# Patient Record
Sex: Female | Born: 1945
Health system: Southern US, Community
[De-identification: ages and names within clinical notes are randomized; demographics above are authoritative.]

## PROBLEM LIST (undated history)

## (undated) DIAGNOSIS — F329 Major depressive disorder, single episode, unspecified: Secondary | ICD-10-CM

## (undated) DIAGNOSIS — I701 Atherosclerosis of renal artery: Secondary | ICD-10-CM

## (undated) DIAGNOSIS — Z9289 Personal history of other medical treatment: Secondary | ICD-10-CM

## (undated) DIAGNOSIS — I471 Supraventricular tachycardia: Secondary | ICD-10-CM

## (undated) DIAGNOSIS — R011 Cardiac murmur, unspecified: Secondary | ICD-10-CM

## (undated) DIAGNOSIS — K2980 Duodenitis without bleeding: Secondary | ICD-10-CM

## (undated) DIAGNOSIS — D649 Anemia, unspecified: Secondary | ICD-10-CM

## (undated) DIAGNOSIS — K222 Esophageal obstruction: Secondary | ICD-10-CM

## (undated) DIAGNOSIS — I10 Essential (primary) hypertension: Secondary | ICD-10-CM

## (undated) DIAGNOSIS — R0600 Dyspnea, unspecified: Secondary | ICD-10-CM

## (undated) DIAGNOSIS — F419 Anxiety disorder, unspecified: Secondary | ICD-10-CM

## (undated) DIAGNOSIS — E785 Hyperlipidemia, unspecified: Secondary | ICD-10-CM

## (undated) DIAGNOSIS — C9000 Multiple myeloma not having achieved remission: Secondary | ICD-10-CM

## (undated) DIAGNOSIS — K219 Gastro-esophageal reflux disease without esophagitis: Secondary | ICD-10-CM

## (undated) DIAGNOSIS — K259 Gastric ulcer, unspecified as acute or chronic, without hemorrhage or perforation: Secondary | ICD-10-CM

## (undated) DIAGNOSIS — I495 Sick sinus syndrome: Secondary | ICD-10-CM

## (undated) DIAGNOSIS — I219 Acute myocardial infarction, unspecified: Secondary | ICD-10-CM

## (undated) DIAGNOSIS — N183 Chronic kidney disease, stage 3 unspecified: Secondary | ICD-10-CM

## (undated) DIAGNOSIS — H3561 Retinal hemorrhage, right eye: Secondary | ICD-10-CM

## (undated) DIAGNOSIS — I2510 Atherosclerotic heart disease of native coronary artery without angina pectoris: Secondary | ICD-10-CM

## (undated) DIAGNOSIS — I82409 Acute embolism and thrombosis of unspecified deep veins of unspecified lower extremity: Secondary | ICD-10-CM

## (undated) DIAGNOSIS — I249 Acute ischemic heart disease, unspecified: Secondary | ICD-10-CM

## (undated) DIAGNOSIS — F32A Depression, unspecified: Secondary | ICD-10-CM

## (undated) DIAGNOSIS — K5790 Diverticulosis of intestine, part unspecified, without perforation or abscess without bleeding: Secondary | ICD-10-CM

## (undated) DIAGNOSIS — D126 Benign neoplasm of colon, unspecified: Secondary | ICD-10-CM

## (undated) HISTORY — PX: TOTAL ABDOMINAL HYSTERECTOMY: SHX209

## (undated) HISTORY — DX: Major depressive disorder, single episode, unspecified: F32.9

## (undated) HISTORY — DX: Benign neoplasm of colon, unspecified: D12.6

## (undated) HISTORY — DX: Depression, unspecified: F32.A

## (undated) HISTORY — PX: TONSILLECTOMY: SUR1361

## (undated) HISTORY — PX: APPENDECTOMY: SHX54

## (undated) HISTORY — DX: Gastro-esophageal reflux disease without esophagitis: K21.9

## (undated) HISTORY — DX: Gastric ulcer, unspecified as acute or chronic, without hemorrhage or perforation: K25.9

## (undated) HISTORY — DX: Hyperlipidemia, unspecified: E78.5

## (undated) HISTORY — DX: Anemia, unspecified: D64.9

## (undated) HISTORY — PX: LUMBAR DISC SURGERY: SHX700

## (undated) HISTORY — DX: Esophageal obstruction: K22.2

## (undated) HISTORY — DX: Duodenitis without bleeding: K29.80

## (undated) HISTORY — DX: Cardiac murmur, unspecified: R01.1

## (undated) HISTORY — DX: Diverticulosis of intestine, part unspecified, without perforation or abscess without bleeding: K57.90

## (undated) HISTORY — PX: BACK SURGERY: SHX140

---

## 1997-10-20 DIAGNOSIS — I2510 Atherosclerotic heart disease of native coronary artery without angina pectoris: Secondary | ICD-10-CM

## 1997-10-20 DIAGNOSIS — I701 Atherosclerosis of renal artery: Secondary | ICD-10-CM

## 1997-10-20 DIAGNOSIS — I219 Acute myocardial infarction, unspecified: Secondary | ICD-10-CM

## 1997-10-20 HISTORY — DX: Acute myocardial infarction, unspecified: I21.9

## 1997-10-20 HISTORY — DX: Atherosclerosis of renal artery: I70.1

## 1997-10-20 HISTORY — PX: CORONARY ANGIOPLASTY WITH STENT PLACEMENT: SHX49

## 1997-10-20 HISTORY — DX: Atherosclerotic heart disease of native coronary artery without angina pectoris: I25.10

## 1997-10-20 HISTORY — PX: RENAL ARTERY STENT: SHX2321

## 1998-09-07 ENCOUNTER — Encounter: Payer: Self-pay | Admitting: Emergency Medicine

## 1998-09-07 ENCOUNTER — Inpatient Hospital Stay (HOSPITAL_COMMUNITY): Admission: AD | Admit: 1998-09-07 | Discharge: 1998-09-11 | Payer: Self-pay | Admitting: Cardiovascular Disease

## 1999-04-05 ENCOUNTER — Encounter: Payer: Self-pay | Admitting: Cardiovascular Disease

## 1999-04-05 ENCOUNTER — Inpatient Hospital Stay (HOSPITAL_COMMUNITY): Admission: AD | Admit: 1999-04-05 | Discharge: 1999-04-07 | Payer: Self-pay | Admitting: Cardiovascular Disease

## 2003-11-09 ENCOUNTER — Emergency Department (HOSPITAL_COMMUNITY): Admission: AD | Admit: 2003-11-09 | Discharge: 2003-11-09 | Payer: Self-pay | Admitting: Emergency Medicine

## 2004-01-15 ENCOUNTER — Emergency Department (HOSPITAL_COMMUNITY): Admission: EM | Admit: 2004-01-15 | Discharge: 2004-01-16 | Payer: Self-pay | Admitting: Emergency Medicine

## 2004-01-28 ENCOUNTER — Inpatient Hospital Stay (HOSPITAL_COMMUNITY): Admission: AD | Admit: 2004-01-28 | Discharge: 2004-02-02 | Payer: Self-pay | Admitting: Emergency Medicine

## 2005-05-09 ENCOUNTER — Emergency Department (HOSPITAL_COMMUNITY): Admission: EM | Admit: 2005-05-09 | Discharge: 2005-05-09 | Payer: Self-pay | Admitting: Emergency Medicine

## 2006-05-19 ENCOUNTER — Emergency Department (HOSPITAL_COMMUNITY): Admission: EM | Admit: 2006-05-19 | Discharge: 2006-05-19 | Payer: Self-pay | Admitting: Emergency Medicine

## 2006-11-16 ENCOUNTER — Encounter: Admission: RE | Admit: 2006-11-16 | Discharge: 2006-11-16 | Payer: Self-pay | Admitting: Cardiovascular Disease

## 2007-08-22 ENCOUNTER — Emergency Department (HOSPITAL_COMMUNITY): Admission: EM | Admit: 2007-08-22 | Discharge: 2007-08-22 | Payer: Self-pay | Admitting: Emergency Medicine

## 2008-06-09 ENCOUNTER — Emergency Department (HOSPITAL_COMMUNITY): Admission: EM | Admit: 2008-06-09 | Discharge: 2008-06-09 | Payer: Self-pay | Admitting: Emergency Medicine

## 2010-10-04 ENCOUNTER — Emergency Department (HOSPITAL_COMMUNITY)
Admission: EM | Admit: 2010-10-04 | Discharge: 2010-10-04 | Payer: Self-pay | Source: Home / Self Care | Attending: Cardiovascular Disease | Admitting: Cardiovascular Disease

## 2010-10-20 HISTORY — PX: RETINAL LASER PROCEDURE: SHX2339

## 2010-12-30 LAB — BRAIN NATRIURETIC PEPTIDE: Pro B Natriuretic peptide (BNP): 177 pg/mL — ABNORMAL HIGH (ref 0.0–100.0)

## 2010-12-30 LAB — BASIC METABOLIC PANEL
CO2: 24 mEq/L (ref 19–32)
Chloride: 110 mEq/L (ref 96–112)
GFR calc non Af Amer: >60 mL/min (ref >60)
Glucose, Bld: 118 mg/dL — ABNORMAL HIGH (ref 70–99)
Potassium: 3.4 mEq/L — ABNORMAL LOW (ref 3.5–5.1)

## 2010-12-30 LAB — CK TOTAL AND CKMB (NOT AT ARMC): Relative Index: 1.5 (ref 0.0–2.5)

## 2010-12-30 LAB — CBC
HCT: 37.6 % (ref 36.0–46.0)
MCV: 89.3 fL (ref 78.0–100.0)
Platelets: 192 10*3/uL (ref 150–400)
RBC: 4.21 MIL/uL (ref 3.87–5.11)
RDW: 15.1 % (ref 11.5–15.5)
WBC: 4.6 10*3/uL (ref 4.0–10.5)

## 2010-12-30 LAB — DIFFERENTIAL
Eosinophils Absolute: 0.2 10*3/uL (ref 0.0–0.7)
Eosinophils Relative: 3 % (ref 0–5)
Lymphs Abs: 1.4 10*3/uL (ref 0.7–4.0)

## 2010-12-30 LAB — TSH: TSH: 0.953 u[IU]/mL (ref 0.350–4.500)

## 2010-12-30 LAB — URINE MICROSCOPIC-ADD ON

## 2010-12-30 LAB — URINALYSIS, ROUTINE W REFLEX MICROSCOPIC
Bilirubin Urine: NEGATIVE
Protein, ur: 30 mg/dL — AB

## 2011-03-07 NOTE — Cardiovascular Report (Signed)
[name], [name]                              ACCOUNT NO.:  [id number]   MEDICAL RECORD NO.:  [id number]                   PATIENT TYPE:  INP   LOCATION:  2027                                 FACILITY:  [facility]   PHYSICIAN:  Ricki Rodriguez, M.D.               DATE OF BIRTH:  1946-07-27   DATE OF PROCEDURE:  01/30/2004  DATE OF DISCHARGE:  02/02/2004                              CARDIAC CATHETERIZATION   PROCEDURES PERFORMED:  1. Left heart catheterization.  2. Selective coronary angiography.  3. Left ventricular systolic function study.  4. Descending aortography.  5. Bilateral renal angiography.   CARDIOLOGIST:  Ricki Rodriguez, M.D.   INDICATIONS:  This 64 year old black female had chest pain and hypertension  with history of angioplasty of the left circumflex coronary artery in the  past along with a history of smoking, abnormal cardiogram, old myocardial  infarction, and family history of coronary artery disease.   ACCESS:  Approach; right femoral artery using 6 French sheath and catheters.   COMPLICATIONS:  None.   HEMODYNAMIC DATA:  Left ventricular pressure was 191/7 and aortic pressure  was 192/109.  Right renal artery pressure was 175/101 showing a 24 mm  gradient and the left renal artery showed a pressure of 159/95 with a 31 mm  gradient.   VENTRICULOGRAPHIC DATA:  Left Ventriculogram:  The left ventriculogram  showed hyperdynamic wall motion with an ejection fraction of 70-75%.   Left Main Coronary Artery:  The left main coronary artery showed mild distal  disease of 10%, otherwise unremarkable.   Left Anterior Descending Coronary Artery:  The left anterior descending  coronary artery showed proximal 20-30% stenosis of posterior diagonal-1  origin and a 40-50% eccentric lesion with a little slow distal filling of  TIMI Grade II to III.  The diagonal-1 had a proximal-to-midvessel long 50%  diseasing prior to dividing into the superoinferior branch.  Another  smaller  branch of the diagonal-1 vessel with a region in the middle of the lesion;  has 80-90% ostial lesion, but less than 1 mm vessel size.   Left Circumflex Coronary Artery:  The left circumflex coronary artery showed  mild proximal disease of 10-20%, then 30% eccentric lesions and in-stent 10-  20% stenosis, post stent 20-30% stenosis.  The obtuse marginal branch-1 had  ostial 30-40% lesion, but was a 1.5 mm vessel.  The obtuse marginal branch-2  and 3 were normal vessels.   Right Coronary Artery:  The right coronary artery showed diffuse __________  appearance of the entire vessel with 30-40% proximal  concentric lesions;  midvessel 50% and distal 20-30% lesions.  The posterior descending coronary  artery had mild diffuse disease of 10-20% in severity and the posterolateral  branch was unremarkable.   AORTOGRAPHIC DATA:  Ascending aortography showed bilateral renal artery  disease.  The left renal artery had 70% eccentric proximal lesion and the  right renal artery also had 70% eccentric proximal lesion.   IMPRESSION:  1. Mild-to-moderate multivessel native vessel coronary artery disease.  2. Patent stent site in the left circumflex coronary artery.  3. Severe bilateral renal artery disease.  4. Hypertensive heart disease.   RECOMMENDATIONS:  1. This patient will be treated medically for coronary artery disease.  2. The patient will be scheduled for PTCA with stent placement in the     bilateral renal arteries by Dr. Charolette Child.  3. We will also change her Altace to clonidine, Norvasc and a diuretic.                                               Ricki Rodriguez, M.D.    ASK/MEDQ  D:  02/28/2004  T:  03/01/2004  Job:  [id number]

## 2011-03-07 NOTE — Discharge Summary (Signed)
NAMEQUINNLYN, HEARNS                              ACCOUNT NO.:  [id number]   MEDICAL RECORD NO.:  [id number]                   PATIENT TYPE:  INP   LOCATION:  2027                                 FACILITY:  [facility]   PHYSICIAN:  Ricki Rodriguez, M.D.               DATE OF BIRTH:  12/14/1945   DATE OF ADMISSION:  01/27/2004  DATE OF DISCHARGE:  02/02/2004                                 DISCHARGE SUMMARY   REFERRING PHYSICIAN:  Eric L. August Saucer, M.D.   DISCHARGE DIAGNOSES:  1. Bilateral renal artery stenoses.  2. Hypertension.  3. Sinus bradycardia, paroxysmal and iatrogenic.  4. Beta-blocker and clonidine adverse effects.  5. Bilateral renal artery stenting.  6. Multivessel and 3-vessel coronary artery disease.   DISCHARGE MEDICATIONS:  1. Aspirin 325 mg 1 p.o. daily.  2. Plavix 75 mg 1 p.o. daily.  3. Altace 10 mg 1 p.o. daily.  4. Norvasc 10 mg 1 p.o. daily.  5. Hydrochlorothiazide 12.5 mg 1 daily.  6. Lipitor 20 mg 1 daily.  7. Nitroglycerin 0.4 mg sublingual q.5 minutes x3 p.r.n. chest pain.  8. Imdur 30 mg 1/2 tablet daily.  9. For pain management, use Tylenol as needed.   FOLLOW UP:  By Dr. Ricki Rodriguez in 2 weeks and Dr. Willey Blade in 1 month.   CONDITION ON DISCHARGE:  Improved.   ACTIVITY:  As tolerated.   DIET:  Low salt, low fat diet.   WOUND CARE:  Patient to notify for right groin pain swelling or discharge.   HISTORY:  This 65 year old black female presented with weakness, dizziness  and uncontrolled hypertension, with frequent hospitalizations. Had  hypertension for 9 years, smokes 1/2 pack of cigarettes for 25 years.  Myocardial infarction in November '99, obesity, family history of  significant coronary artery disease.   PHYSICAL EXAMINATION:  VITAL SIGNS:  Temperature 98, pulse 50, respirations  16, blood pressure 160/82, height 5 feet, 5 inches, weight 195 pounds.  GENERAL:  Patient was alert and oriented x3.  HEENT:  Head normocephalic, atraumatic.   Pupils are equal, round and  reactive to light, extraocular movements intact.  Conjunctivae pink, sclerae  nonicteric.  Eye color brown.  ENT revealed patient to be edentulous.  NECK:  No JVD, no carotid bruits.  LUNGS:  Clear bilaterally.  CARDIOVASCULAR:  Normal S1 and S2, with grade 2/6 systolic murmur.  ABDOMEN:  Soft, nontender.  EXTREMITIES:  No clubbing, cyanosis or edema.  NEUROLOGIC:  Grossly intact. Patient moves all 4 extremities.   LABORATORY DATA:  Normal hemoglobin, hematocrit, WBC, platelet count, normal  CK-MB, troponin I negative, albumin level normal, electrolytes, BUN,  creatinine normal. Cholesterol level 229, LDL cholesterol elevated at 150,  HDL cholesterol normal at 51.  Thyroid stimulating hormone unremarkable.  Cardiac catheterization showed hypertensive heart with mild, multivessel  native coronary artery disease, patent stent site in left circumflex  coronary artery, but severe bilateral renal artery stenoses.   HOSPITAL COURSE:  The patient was admitted to telemetry unit, myocardial  infarction was ruled out. She underwent a cardiac catheterization because of  her multiples cardiac risk factors as well as known history of coronary  artery disease.  This revealed mild to moderate multivessel coronary artery  disease, however, she had severe bilateral renal artery stenoses. This was  treated with Genesis 6 x 15 mm stent in left renal artery and Genesis 5 x 12  mm in right renal artery, 8-12 atmospheres for 30-15 seconds respectively.  The patient tolerated the procedure well and had good angiographic results.   Post-procedure condition remained stable. Hence, she was discharged to home  on February 02, 2004 in satisfactory condition, with follow up with myself in 2  weeks.  Heart catheterization was done by myself, Dr. Algie Coffer and renogram  and stent placement was done by Dr. Charolette Child.                                                Ricki Rodriguez,  M.D.    ASK/MEDQ  D:  02/02/2004  T:  02/03/2004  Job:  [id number]

## 2011-03-07 NOTE — Cardiovascular Report (Signed)
[name], [name]                              ACCOUNT NO.:  [id number]   MEDICAL RECORD NO.:  [id number]                   PATIENT TYPE:  INP   LOCATION:  2027                                 FACILITY:  [facility]   PHYSICIAN:  Aram Candela. [name], M.D.              DATE OF BIRTH:  11-20-1945   DATE OF PROCEDURE:  02/01/2004  DATE OF DISCHARGE:  02/02/2004                              CARDIAC CATHETERIZATION   PROCEDURES:  1. Bilateral renal artery angiography.  2. Bilateral renal artery angioplasty with stent placement.  3. Right femoral artery AngioSeal.   CARDIOLOGIST:  Aram Candela. Aleen Campi, M.D.   REFERRING PHYSICIAN:  Ricki Rodriguez, M.D.   INDICATIONS:  This 65 year old female with severe difficulty controlling  hypertension underwent a cardiac catheterization earlier this week and was  found to have mild to moderate coronary lesions but because of her severe  hypertension she also had an abdominal aortogram which noted severe  bilateral renal artery stenosis.  She was then scheduled for a study and  intervention today.   DESCRIPTION OF PROCEDURE:  After signing an informed consent, the patient  was premedicated with 5 mg of Valium by mouth and brought to the peripheral  vascular laboratory on the sixth floor of [facility].  Her right  groin was prepped and draped in the sterile fashion and anesthetized locally  with 1% lidocaine.  A 6 French introducer sheath was inserted percutaneously  into the right femoral artery.  A 6 French short right guide catheter was  inserted through the right femoral sheath and advanced to the mid abdominal  aorta.  Injections were made into both renal arteries.  After sizing the  vessels we selected a stabilizer wire which was inserted through the guide  catheter and into the left renal artery.  We then selected a Genesis 6 x 15  stent deployment system was advanced over the guidewire and positioned  within the ostial lesion in the left renal  artery. The stent was then  deployed with one inflation at 12 atmospheres for 30 seconds.  Another  injection into the left renal artery was made.  We then inserted the  stabilizer wire into the right renal artery and selected a Genesis 5 x 12  stent deployment system which was advanced over the guidewire and positioned  within the ostial lesion and the right renal artery.  This stent was then  deployed with one inflation at 10 atmospheres for 15 seconds.  After the  stent was deployed again a further injection was made into the right renal  artery.  The patient tolerated the procedure well, and no complications were  noted.  At the end of the procedure, the catheters and sheaths were removed  from the right femoral artery, and hemostasis was easily obtained with an  AngioSeal closure system.   MEDICATIONS GIVEN:  Heparin 4000 units IV.  CINE FINDINGS:  1. Right renal artery:  The right renal artery has an ostial lesion of 80%.     This was followed by a short 8-10 mm segment of 60-70% stenosis.  The     remainder of the right renal artery appeared normal.  2. Left renal artery:  The ostium has an 80% stenotic lesion which is     approximately 12 cm in length.  The remainder of the left renal artery     appeared normal.   ANGIOPLASTY CINE:  Cines taken during the angioplasty showed proper  positioning of the guidewire and balloon catheters.  Final injections into  both renal arteries showed an excellent angiographic result with 0% residual  lesion and no evidence for dissection or clot.  There was normal antegrade  flow.   FINAL DIAGNOSES:  1. Severe bilateral renal artery stenosis.  2. Successful angioplasty with primary stent placement in both renal artery     ostial lesions.  3. Successful AngioSeal of the right femoral artery.   DISPOSITION:  1. Monitor on telemetry overnight.  2. Further medical therapy will be as per Dr. Jodelle Green.                                                Abigail [name], M.D.    JRT/MEDQ  D:  02/01/2004  T:  02/04/2004  Job:  [id number]   cc:   Ricki Rodriguez, M.D.  Fax: [phone]   Peripheral Vascular Lab - [facility]

## 2011-07-29 LAB — D-DIMER, QUANTITATIVE: D-Dimer, Quant: <0.22

## 2011-12-18 ENCOUNTER — Encounter (HOSPITAL_COMMUNITY): Payer: Self-pay | Admitting: Emergency Medicine

## 2011-12-18 ENCOUNTER — Emergency Department (INDEPENDENT_AMBULATORY_CARE_PROVIDER_SITE_OTHER)
Admission: EM | Admit: 2011-12-18 | Discharge: 2011-12-18 | Disposition: A | Discharge status: Home Health | Payer: Self-pay | Source: Home / Self Care | Attending: Emergency Medicine | Admitting: Emergency Medicine

## 2011-12-18 DIAGNOSIS — R252 Cramp and spasm: Secondary | ICD-10-CM

## 2011-12-18 HISTORY — DX: Essential (primary) hypertension: I10

## 2011-12-18 HISTORY — DX: Acute myocardial infarction, unspecified: I21.9

## 2011-12-18 HISTORY — DX: Retinal hemorrhage, right eye: H35.61

## 2011-12-18 LAB — POCT I-STAT, CHEM 8
Chloride: 109 mEq/L (ref 96–112)
Glucose, Bld: 103 mg/dL — ABNORMAL HIGH (ref 70–99)
HCT: 49 % — ABNORMAL HIGH (ref 36.0–46.0)
Hemoglobin: 16.7 g/dL — ABNORMAL HIGH (ref 12.0–15.0)
Potassium: 3.7 mEq/L (ref 3.5–5.1)
Sodium: 141 mEq/L (ref 135–145)
TCO2: 23 mmol/L (ref 0–100)

## 2011-12-18 MED ORDER — MELOXICAM 15 MG PO TABS: 15.0000 mg | ORAL_TABLET | Freq: Every day | ORAL | Status: DC

## 2011-12-18 MED ORDER — METHOCARBAMOL 500 MG PO TABS: 500.0000 mg | ORAL_TABLET | Freq: Four times a day (QID) | ORAL | Status: AC

## 2011-12-18 MED ORDER — COQ-10 150 MG PO CAPS: 150.0000 mg | ORAL_CAPSULE | ORAL | Status: DC

## 2011-12-18 NOTE — ED Notes (Signed)
Left leg pain onset Tuesday morning, no known injury, sudden onset.  Reports thigh pain, pain worse with movement, pain radiates into lower leg with movement

## 2011-12-18 NOTE — ED Notes (Signed)
Assisted getting dress, patient being discharged.

## 2011-12-18 NOTE — Discharge Instructions (Signed)
Take the medication as written. You may also try vitamin E. complex, or coQ10 for muscle cramps. Return to the ER if your urine turns dark, achy chest pain, shortness of breath, asymmetric leg swelling, or any other concerns.

## 2011-12-18 NOTE — ED Provider Notes (Signed)
History     CSN: [id number]  Arrival date & time 12/18/11  0830   First MD Initiated Contact with Patient 12/18/11 [phone]      Chief Complaint  Patient presents with  . Leg Pain    (Consider location/radiation/quality/duration/timing/severity/associated sxs/prior treatment) HPI Comments: Patient with constant, dull, achy left thigh pain starting 2 days ago. Patient states pain is worse with hip flexion, walking, extending her leg. Pain is better with her leg straightened out. Patient states that the pain occasionally radiates to her left calf when she is walking. No recent remote trauma to her left leg. no calf or thigh swelling, redness. No recent immobilization, history of DVT/PE. No fevers, weakness, paresthesias. No rash on the thigh. No back pain, hip pain, urinary urgency, frequency, hematuria, change in color of her urine. No recent change in medications.   ROS as noted in HPI. All other ROS negative.   Patient is a 66 y.o. female presenting with leg pain. The history is provided by the patient. No language interpreter was used.  Leg Pain  The incident occurred more than 2 days ago. The incident occurred at home. There was no injury mechanism. The pain is present in the left thigh. Pertinent negatives include no numbness, no inability to bear weight, no loss of motion, no muscle weakness, no loss of sensation and no tingling. The symptoms are aggravated by bearing weight. She has tried heat (Aspirin) for the symptoms. The treatment provided mild relief.    Past Medical History  Diagnosis Date  . Hypertension   . MI (myocardial infarction)   . Retinal hemorrhage, right eye     Past Surgical History  Procedure Date  . Cardiac stent   . Back surgery   . Appendectomy     History reviewed. No pertinent family history.  History  Substance Use Topics  . Smoking status: Current Everyday Smoker  . Smokeless tobacco: Not on file  . Alcohol Use: No    OB History    Grav  Para Term Preterm Abortions TAB SAB Ect Mult Living                  Review of Systems  Neurological: Negative for tingling and numbness.    Allergies  Review of patient's allergies indicates no known allergies.  Home Medications   Current Outpatient Rx  Name Route Sig Dispense Refill  . ASPIRIN 500 MG PO TBEC Oral Take 500 mg by mouth every 6 (six) hours as needed.    . ATENOLOL 50 MG PO TABS Oral Take 50 mg by mouth daily.    Marland Kitchen CLONIDINE HCL 0.2 MG PO TABS Oral Take 0.2 mg by mouth 2 (two) times daily.    . ISOSORBIDE DINITRATE PO Oral Take by mouth.    Marya Landry PO Oral Take by mouth.      BP 152/94  Pulse 80  Temp(Src) 98.2 F (36.8 C) (Oral)  Resp 18  SpO2 99%  Physical Exam  Nursing note and vitals reviewed. Constitutional: She is oriented to person, place, and time. She appears well-developed and well-nourished. No distress.  HENT:  Head: Normocephalic and atraumatic.  Eyes: Conjunctivae and EOM are normal.  Neck: Normal range of motion.  Cardiovascular: Normal rate.   Pulmonary/Chest: Effort normal.  Abdominal: She exhibits no distension.  Musculoskeletal: Normal range of motion.       Legs:      No signs of trauma L hip, thigh. No bruising, erythema, rash. Diffuse muscular  tenderness over  quadriceps. No tenderness over gluteal muscles, down IT band. No pain/pain with passive abduction/adduction of leg, int/ext rotation hip. No tenderness at sciatic notch. Roll test for muscle spasm negative. Pain with hip flexion, knee extension, no pain with hip extension, knee flexion.   Knee joint NT,  Sensation to LT intact. No lower extremity edema. Calves symmetric, nontender. DP 2+.   Neurological: She is alert and oriented to person, place, and time.  Skin: Skin is warm and dry.  Psychiatric: She has a normal mood and affect. Her behavior is normal. Judgment and thought content normal.    ED Course  Procedures (including critical care time)  Labs Reviewed - No  data to display No results found. Results for orders placed during the hospital encounter of 12/18/11  POCT I-STAT, CHEM 8      Component Value Range   Sodium 141  135 - 145 (mEq/L)   Potassium 3.7  3.5 - 5.1 (mEq/L)   Chloride 109  96 - 112 (mEq/L)   BUN 17  6 - 23 (mg/dL)   Creatinine, Ser 4.09  0.50 - 1.10 (mg/dL)   Glucose, Bld 811 (*) 70 - 99 (mg/dL)   Calcium, Ion 9.14  1.12 - 1.32 (mmol/L)   TCO2 23  0 - 100 (mmol/L)   Hemoglobin 16.7 (*) 12.0 - 15.0 (g/dL)   HCT 78.2 (*) 95.6 - 46.0 (%)     1. Leg cramps     MDM  Previous records, labs reviewed. Patient seen several years ago for similar complaint. Dimer was negative. Thought to have muscles cramps//spasm. Patient is on hydrochlorothiazide/calcium channel blocker. Will check electrolytes. No history that would suggest rhabdomyolysis. Physical exam also consistent with muscle spasm. I-STAT within normal limits, will send home with anti-inflammatories, muscle relaxants. Will have her followup with her doctor if no improvement in several days.  Luiz Blare, MD 12/18/11 1556

## 2011-12-30 ENCOUNTER — Other Ambulatory Visit: Payer: Self-pay

## 2011-12-30 ENCOUNTER — Encounter (HOSPITAL_COMMUNITY): Payer: Self-pay | Admitting: Emergency Medicine

## 2011-12-30 ENCOUNTER — Inpatient Hospital Stay (HOSPITAL_COMMUNITY)
Admission: EM | Admit: 2011-12-30 | Discharge: 2012-01-02 | DRG: 303 | Disposition: A | Discharge status: Home / Self Care | Payer: Medicare Other | Attending: Cardiology | Admitting: Cardiology

## 2011-12-30 ENCOUNTER — Emergency Department (HOSPITAL_COMMUNITY): Payer: Medicare Other

## 2011-12-30 DIAGNOSIS — Z8249 Family history of ischemic heart disease and other diseases of the circulatory system: Secondary | ICD-10-CM

## 2011-12-30 DIAGNOSIS — I209 Angina pectoris, unspecified: Secondary | ICD-10-CM | POA: Diagnosis present

## 2011-12-30 DIAGNOSIS — F17200 Nicotine dependence, unspecified, uncomplicated: Secondary | ICD-10-CM | POA: Diagnosis present

## 2011-12-30 DIAGNOSIS — E7800 Pure hypercholesterolemia, unspecified: Secondary | ICD-10-CM | POA: Diagnosis present

## 2011-12-30 DIAGNOSIS — I252 Old myocardial infarction: Secondary | ICD-10-CM

## 2011-12-30 DIAGNOSIS — Z9861 Coronary angioplasty status: Secondary | ICD-10-CM

## 2011-12-30 DIAGNOSIS — I2510 Atherosclerotic heart disease of native coronary artery without angina pectoris: Principal | ICD-10-CM | POA: Diagnosis present

## 2011-12-30 DIAGNOSIS — I10 Essential (primary) hypertension: Secondary | ICD-10-CM | POA: Diagnosis present

## 2011-12-30 DIAGNOSIS — R079 Chest pain, unspecified: Secondary | ICD-10-CM

## 2011-12-30 HISTORY — DX: Atherosclerotic heart disease of native coronary artery without angina pectoris: I25.10

## 2011-12-30 LAB — BASIC METABOLIC PANEL
Calcium: 10.3 mg/dL (ref 8.4–10.5)
Creatinine, Ser: 0.82 mg/dL (ref 0.50–1.10)
GFR calc non Af Amer: 73 mL/min — ABNORMAL LOW (ref >90)
Glucose, Bld: 162 mg/dL — ABNORMAL HIGH (ref 70–99)
Sodium: 135 mEq/L (ref 135–145)

## 2011-12-30 LAB — CBC
Hemoglobin: 14.9 g/dL (ref 12.0–15.0)
MCH: 30 pg (ref 26.0–34.0)
MCHC: 34.4 g/dL (ref 30.0–36.0)
MCV: 87.3 fL (ref 78.0–100.0)

## 2011-12-30 LAB — POCT I-STAT TROPONIN I

## 2011-12-30 MED ORDER — ONDANSETRON HCL 4 MG/2ML IJ SOLN
4.0000 mg | Freq: Once | INTRAMUSCULAR | Status: AC
  Administered 2011-12-31: 4 mg via INTRAVENOUS
  Filled 2011-12-30: qty 2

## 2011-12-30 MED ORDER — SODIUM CHLORIDE 0.9 % IV SOLN
Freq: Once | INTRAVENOUS | Status: AC
  Administered 2011-12-31: via INTRAVENOUS

## 2011-12-30 MED ORDER — FAMOTIDINE IN NACL 20-0.9 MG/50ML-% IV SOLN
20.0000 mg | Freq: Once | INTRAVENOUS | Status: AC
  Administered 2011-12-31: 20 mg via INTRAVENOUS
  Filled 2011-12-30: qty 50

## 2011-12-30 MED ORDER — NITROGLYCERIN 0.4 MG SL SUBL
0.4000 mg | SUBLINGUAL_TABLET | SUBLINGUAL | Status: DC | PRN
  Administered 2011-12-31: 0.4 mg via SUBLINGUAL
  Filled 2011-12-30: qty 25

## 2011-12-30 MED ORDER — GI COCKTAIL ~~LOC~~
30.0000 mL | Freq: Once | ORAL | Status: AC
  Administered 2011-12-31: 30 mL via ORAL
  Filled 2011-12-30: qty 30

## 2011-12-30 NOTE — ED Notes (Signed)
Pt from xray

## 2011-12-30 NOTE — ED Provider Notes (Signed)
History     CSN: [id number]  Arrival date & time 12/30/11  2258   First MD Initiated Contact with Patient 12/30/11 2314      Chief Complaint  Patient presents with  . Chest Pain    (Consider location/radiation/quality/duration/timing/severity/associated sxs/prior treatment) HPI The patient presents with 5 hours of chest pain.  The pain began insidiously.  Since onset of pain focally about her inferior sternum.  The pain is pressure-like, with no radiation.  The pain limits her capacity for exercise.  Pain is severe.  No attempts at relief with medication thus far.  The patient notes ongoing intermittent dyspnea.  This is a notably changed daily.  No lightheadedness.  She also complains of nausea, with multiple episodes of emesis.  No fevers, no chills, no confusion, no visual changes, no disorientation, no ataxia. The only recent medication change is the addition of meloxicam for lower generally pain.  She denies new extremity edema Past Medical History  Diagnosis Date  . Hypertension   . MI (myocardial infarction)   . Retinal hemorrhage, right eye   . Coronary artery disease     Past Surgical History  Procedure Date  . Cardiac stent   . Back surgery   . Appendectomy   . Abdominal hysterectomy     No family history on file.  History  Substance Use Topics  . Smoking status: Current Everyday Smoker  . Smokeless tobacco: Not on file  . Alcohol Use: No    OB History    Grav Para Term Preterm Abortions TAB SAB Ect Mult Living                  Review of Systems  Constitutional:       HPI  HENT:       HPI otherwise negative  Eyes: Negative.   Respiratory:       HPI, otherwise negative  Cardiovascular:       HPI, otherwise nmegative  Gastrointestinal: Positive for nausea and vomiting. Negative for diarrhea.  Genitourinary:       HPI, otherwise negative  Musculoskeletal:       HPI, otherwise negative  Skin: Negative.   Neurological: Negative for syncope.     Allergies  Review of patient's allergies indicates no known allergies.  Home Medications   Current Outpatient Rx  Name Route Sig Dispense Refill  . ATENOLOL 25 MG PO TABS Oral Take 25 mg by mouth daily.    Marland Kitchen CLONIDINE HCL 0.1 MG PO TABS Oral Take 0.1 mg by mouth at bedtime.    Marland Kitchen CO-ENZYME Q-10 50 MG PO CAPS Oral Take 150 mg by mouth daily.    . ISOSORBIDE MONONITRATE ER 60 MG PO TB24 Oral Take 60 mg by mouth daily.    . MELOXICAM 15 MG PO TABS Oral Take 15 mg by mouth daily. Joint pain    . METHOCARBAMOL 500 MG PO TABS Oral Take 500 mg by mouth 4 (four) times daily. Muscle spasms    . OLMESARTAN-AMLODIPINE-HCTZ 40-5-25 MG PO TABS Oral Take 1 tablet by mouth daily.      BP 181/95  Pulse 68  Temp(Src) 98.4 F (36.9 C) (Oral)  Resp 20  SpO2 99%  Physical Exam  Nursing note and vitals reviewed. Constitutional: She is oriented to person, place, and time. She appears well-developed and well-nourished. No distress.  HENT:  Head: Normocephalic and atraumatic.  Eyes: Conjunctivae and EOM are normal.  Cardiovascular: Normal rate and regular rhythm.   Pulmonary/Chest:  Effort normal and breath sounds normal. No stridor. No respiratory distress.  Abdominal: She exhibits no distension.  Musculoskeletal: She exhibits no edema.  Neurological: She is alert and oriented to person, place, and time. No cranial nerve deficit.  Skin: Skin is warm and dry.  Psychiatric: She has a normal mood and affect.    ED Course  Procedures (including critical care time)   Labs Reviewed  CBC  BASIC METABOLIC PANEL  TROPONIN I  PROTIME-INR  D-DIMER, QUANTITATIVE   No results found.   No diagnosis found.  Pulse oximetry 99% room air normal Cardiac monitor 70 sinus rhythm normal Chest x-ray reviewed by me   Date: 12/30/2011  Rate: 74  Rhythm: normal sinus rhythm  QRS Axis: normal  Intervals: normal  ST/T Wave abnormalities: ST depressions inferiorly and ST depressions laterally   Conduction Disutrbances:none  Narrative Interpretation:   Old EKG Reviewed: changes noted ABNORMAL ECG   MDM  This patient presents with several hours of new chest pain.  Notably, the patient describes the pain as being the same as that she experienced prior to an MI in the distant past.  The patient had significant pain reduction in the emergency department.  The patient's labs are notable for an unremarkable initial troponin.  She did have a positive d-dimer.  Subsequent CTA did not demonstrate PE.  The patient's echocardiogram had ST depressions inferiorly and laterally.  Given these findings, the patient's description of pain is the same as prior to another ischemic event she was admitted for further evaluation and management      Gerhard Munch, MD 12/31/11 [phone]

## 2011-12-30 NOTE — ED Notes (Signed)
Patient transported to X-ray 

## 2011-12-30 NOTE — ED Notes (Signed)
PT. REPORTS SUBSTERNAL CHEST PAIN WITH SOB AND VOMITTING ONSET TODAY.

## 2011-12-31 ENCOUNTER — Encounter (HOSPITAL_COMMUNITY): Payer: Self-pay | Admitting: Radiology

## 2011-12-31 ENCOUNTER — Inpatient Hospital Stay (HOSPITAL_COMMUNITY): Payer: Medicare Other

## 2011-12-31 ENCOUNTER — Other Ambulatory Visit: Payer: Self-pay

## 2011-12-31 ENCOUNTER — Emergency Department (HOSPITAL_COMMUNITY): Payer: Medicare Other

## 2011-12-31 DIAGNOSIS — R079 Chest pain, unspecified: Secondary | ICD-10-CM

## 2011-12-31 LAB — DIFFERENTIAL
Eosinophils Relative: 0 % (ref 0–5)
Lymphocytes Relative: 22 % (ref 12–46)
Monocytes Absolute: 0.4 10*3/uL (ref 0.1–1.0)
Monocytes Relative: 7 % (ref 3–12)
Neutro Abs: 4.6 10*3/uL (ref 1.7–7.7)

## 2011-12-31 LAB — COMPREHENSIVE METABOLIC PANEL
Alkaline Phosphatase: 65 U/L (ref 39–117)
BUN: 24 mg/dL — ABNORMAL HIGH (ref 6–23)
CO2: 22 mEq/L (ref 19–32)
Chloride: 101 mEq/L (ref 96–112)
GFR calc Af Amer: 88 mL/min — ABNORMAL LOW (ref >90)
GFR calc non Af Amer: 76 mL/min — ABNORMAL LOW (ref >90)
Glucose, Bld: 136 mg/dL — ABNORMAL HIGH (ref 70–99)
Potassium: 4.1 mEq/L (ref 3.5–5.1)
Total Bilirubin: 0.3 mg/dL (ref 0.3–1.2)

## 2011-12-31 LAB — CBC
HCT: 42 % (ref 36.0–46.0)
Hemoglobin: 13.4 g/dL (ref 12.0–15.0)
MCV: 88.1 fL (ref 78.0–100.0)
WBC: 6.6 10*3/uL (ref 4.0–10.5)

## 2011-12-31 LAB — CARDIAC PANEL(CRET KIN+CKTOT+MB+TROPI)
CK, MB: 2.8 ng/mL (ref 0.3–4.0)
CK, MB: 4.9 ng/mL — ABNORMAL HIGH (ref 0.3–4.0)
Relative Index: INVALID (ref 0.0–2.5)
Relative Index: 4.6 — ABNORMAL HIGH (ref 0.0–2.5)
Relative Index: 4.2 — ABNORMAL HIGH (ref 0.0–2.5)
Total CK: 74 U/L (ref 7–177)
Total CK: 107 U/L (ref 7–177)

## 2011-12-31 LAB — HEMOGLOBIN A1C
Hgb A1c MFr Bld: 5.7 % — ABNORMAL HIGH (ref <5.7)
Mean Plasma Glucose: 117 mg/dL — ABNORMAL HIGH (ref <117)

## 2011-12-31 LAB — HEPARIN LEVEL (UNFRACTIONATED)
Heparin Unfractionated: >2 IU/mL — ABNORMAL HIGH (ref 0.30–0.70)
Heparin Unfractionated: >2 IU/mL — ABNORMAL HIGH (ref 0.30–0.70)

## 2011-12-31 LAB — APTT: aPTT: >200 seconds (ref 24–37)

## 2011-12-31 LAB — PROTIME-INR
Prothrombin Time: 14.1 seconds (ref 11.6–15.2)
Prothrombin Time: 14.8 seconds (ref 11.6–15.2)

## 2011-12-31 MED ORDER — HEPARIN (PORCINE) IN NACL 100-0.45 UNIT/ML-% IJ SOLN
1050.0000 [IU]/h | INTRAMUSCULAR | Status: DC
  Administered 2011-12-31 (×2): 1050 [IU]/h via INTRAVENOUS
  Filled 2011-12-31 (×3): qty 250

## 2011-12-31 MED ORDER — IOHEXOL 350 MG/ML SOLN
100.0000 mL | Freq: Once | INTRAVENOUS | Status: AC | PRN
  Administered 2011-12-31: 100 mL via INTRAVENOUS

## 2011-12-31 MED ORDER — SODIUM CHLORIDE 0.45 % IV SOLN
INTRAVENOUS | Status: DC
  Administered 2011-12-31: 05:00:00 via INTRAVENOUS

## 2011-12-31 MED ORDER — AMLODIPINE BESYLATE 5 MG PO TABS
5.0000 mg | ORAL_TABLET | Freq: Two times a day (BID) | ORAL | Status: DC
  Administered 2011-12-31 – 2012-01-02 (×4): 5 mg via ORAL
  Filled 2011-12-31 (×5): qty 1

## 2011-12-31 MED ORDER — METOPROLOL TARTRATE 25 MG PO TABS
25.0000 mg | ORAL_TABLET | Freq: Two times a day (BID) | ORAL | Status: DC
  Administered 2011-12-31 – 2012-01-02 (×5): 25 mg via ORAL
  Filled 2011-12-31 (×6): qty 1

## 2011-12-31 MED ORDER — ONDANSETRON HCL 4 MG/2ML IJ SOLN
4.0000 mg | Freq: Four times a day (QID) | INTRAMUSCULAR | Status: DC | PRN
  Administered 2011-12-31 – 2012-01-02 (×6): 4 mg via INTRAVENOUS
  Filled 2011-12-31 (×7): qty 2

## 2011-12-31 MED ORDER — ASPIRIN 300 MG RE SUPP
300.0000 mg | RECTAL | Status: AC
  Filled 2011-12-31: qty 1

## 2011-12-31 MED ORDER — HEPARIN BOLUS VIA INFUSION
3500.0000 [IU] | Freq: Once | INTRAVENOUS | Status: AC
  Administered 2011-12-31: 3500 [IU] via INTRAVENOUS
  Filled 2011-12-31: qty 3500

## 2011-12-31 MED ORDER — REGADENOSON 0.4 MG/5ML IV SOLN
0.4000 mg | Freq: Once | INTRAVENOUS | Status: AC
  Administered 2011-12-31: 0.4 mg via INTRAVENOUS
  Filled 2011-12-31: qty 5

## 2011-12-31 MED ORDER — ASPIRIN 81 MG PO CHEW
324.0000 mg | CHEWABLE_TABLET | ORAL | Status: AC
  Administered 2011-12-31: 324 mg via ORAL
  Filled 2011-12-31: qty 4

## 2011-12-31 MED ORDER — ASPIRIN EC 81 MG PO TBEC
81.0000 mg | DELAYED_RELEASE_TABLET | Freq: Every day | ORAL | Status: DC
  Administered 2012-01-01 – 2012-01-02 (×2): 81 mg via ORAL
  Filled 2011-12-31 (×2): qty 1

## 2011-12-31 MED ORDER — NITROGLYCERIN 0.4 MG SL SUBL: 0.4000 mg | SUBLINGUAL_TABLET | SUBLINGUAL | Status: DC | PRN

## 2011-12-31 MED ORDER — PANTOPRAZOLE SODIUM 40 MG PO TBEC
40.0000 mg | DELAYED_RELEASE_TABLET | Freq: Every day | ORAL | Status: DC
  Administered 2011-12-31 – 2012-01-02 (×3): 40 mg via ORAL
  Filled 2011-12-31 (×3): qty 1

## 2011-12-31 MED ORDER — TECHNETIUM TC 99M TETROFOSMIN IV KIT
10.0000 | PACK | Freq: Once | INTRAVENOUS | Status: AC | PRN
  Administered 2011-12-31: 10 via INTRAVENOUS

## 2011-12-31 MED ORDER — HEPARIN SODIUM (PORCINE) 5000 UNIT/ML IJ SOLN
5000.0000 [IU] | Freq: Three times a day (TID) | INTRAMUSCULAR | Status: DC
  Administered 2011-12-31 – 2012-01-02 (×5): 5000 [IU] via SUBCUTANEOUS
  Filled 2011-12-31 (×8): qty 1

## 2011-12-31 MED ORDER — AMLODIPINE BESYLATE 5 MG PO TABS
5.0000 mg | ORAL_TABLET | Freq: Every day | ORAL | Status: DC
  Administered 2011-12-31: 5 mg via ORAL
  Filled 2011-12-31: qty 1

## 2011-12-31 MED ORDER — ACETAMINOPHEN 325 MG PO TABS: 650.0000 mg | ORAL_TABLET | ORAL | Status: DC | PRN

## 2011-12-31 MED ORDER — ALPRAZOLAM 0.25 MG PO TABS: 0.2500 mg | ORAL_TABLET | Freq: Two times a day (BID) | ORAL | Status: DC | PRN

## 2011-12-31 MED ORDER — NITROGLYCERIN IN D5W 200-5 MCG/ML-% IV SOLN
10.0000 ug/min | INTRAVENOUS | Status: DC
  Administered 2011-12-31: 10 ug/min via INTRAVENOUS
  Filled 2011-12-31: qty 250

## 2011-12-31 MED ORDER — ATORVASTATIN CALCIUM 80 MG PO TABS
80.0000 mg | ORAL_TABLET | Freq: Every day | ORAL | Status: DC
  Administered 2011-12-31 – 2012-01-01 (×2): 80 mg via ORAL
  Filled 2011-12-31 (×3): qty 1

## 2011-12-31 MED ORDER — TECHNETIUM TC 99M TETROFOSMIN IV KIT
30.0000 | PACK | Freq: Once | INTRAVENOUS | Status: AC | PRN
  Administered 2011-12-31: 30 via INTRAVENOUS

## 2011-12-31 NOTE — Progress Notes (Signed)
Stopped Heparin and Nitro drips per MD order

## 2011-12-31 NOTE — ED Notes (Signed)
Pt from CT.  Assisted pt with bedpan.

## 2011-12-31 NOTE — H&P (Signed)
Pt had troponin of  0.38. Dr Sharyn Lull called and made aware, new order received for another set of CE. Pt denies any discomfort at this time. Will cont to monitor.

## 2011-12-31 NOTE — Progress Notes (Signed)
Nitro resumed at 10 mcg/minute. No chest discomfort.

## 2011-12-31 NOTE — Progress Notes (Signed)
Subjective: Patient denies any further chest pain or shortness of breath blood pressure remains elevated   Objective:  Vital Signs in the last 24 hours: Temp:  [98.4 F (36.9 C)-99.4 F (37.4 C)] 99.4 F (37.4 C) (03/13 0656) Pulse Rate:  [56-85] 75  (03/13 1409) Resp:  [15-20] 18  (03/13 0656) BP: (130-181)/(68-108) 181/97 mmHg (03/13 1409) SpO2:  [99 %-100 %] 100 % (03/13 0656) Weight:  [80.5 kg (177 lb 7.5 oz)] 80.5 kg (177 lb 7.5 oz) (03/13 0415)  Intake/Output from previous day:   Intake/Output from this shift:    Physical Exam: Neck: no adenopathy, no carotid bruit, no JVD and supple, symmetrical, trachea midline Lungs: clear to auscultation bilaterally Heart: regular rate and rhythm, S1, S2 normal, no murmur, click, rub or gallop Abdomen: soft, non-tender; bowel sounds normal; no masses,  no organomegaly Extremities: extremities normal, atraumatic, no cyanosis or edema  Lab Results:  Basename 12/31/11 0641 12/30/11 2312  WBC 6.6 6.4  HGB 13.4 14.9  PLT 214 219    Basename 12/31/11 0641 12/30/11 2312  NA 136 135  K 4.1 3.9  CL 101 98  CO2 22 20  GLUCOSE 136* 162*  BUN 24* 28*  CREATININE 0.80 0.82    Basename 12/31/11 0645 12/30/11 2312  TROPONINI <0.30 <0.30   Hepatic Function Panel  Basename 12/31/11 0641  PROT 9.1*  ALBUMIN 4.1  AST 17  ALT 13  ALKPHOS 65  BILITOT 0.3  BILIDIR --  IBILI --    Basename 12/31/11 0641  CHOL 258*   No results found for this basename: PROTIME in the last 72 hours  Imaging: Imaging results have been reviewed and Dg Chest 2 View  12/30/2011  *RADIOLOGY REPORT*  Clinical Data: Left-sided chest pain  CHEST - 2 VIEW  Comparison: 10/04/2010  Findings: Heart size upper normal limits. Atherosclerotic vascular calcifications.  Mediastinal contours otherwise within normal limits. Mild chronic bronchitic change.  No focal consolidation. No pleural effusion or pneumothorax.  No acute osseous abnormality.  IMPRESSION: Mild  chronic bronchitic change.  No acute process identified.  Original Report Authenticated By: Waneta Martins, M.D.   Ct Angio Chest W/cm &/or Wo Cm  12/31/2011  *RADIOLOGY REPORT*  Clinical Data: Chest pain, shortness of breath  CT ANGIOGRAPHY CHEST  Technique:  Multidetector CT imaging of the chest using the standard protocol during bolus administration of intravenous contrast. Multiplanar reconstructed images including MIPs were obtained and reviewed to evaluate the vascular anatomy.  Contrast: OMNIPAQUE IOHEXOL 350 MG/ML IV SOLN  Comparison: 12/30/2011 radiograph  Findings: No pulmonary arterial branch filling defect is evident. The aorta is unopacified with scattered atherosclerotic calcification.  Normal caliber.  Coronary artery calcification. Heart size within the upper normal limits to mildly enlarged.  No pleural or pericardial effusion.  Small hiatal hernia.  No intrathoracic lymphadenopathy.  Limited images through the upper abdomen demonstrate a nonobstructing stone on the right.  There are several cysts within the left kidney.  Bilateral renal artery stents.  The central airways are patent.  Detailed parenchymal evaluation is degraded by respiratory motion. Within this limitation, no focal areas of consolidation.  No pneumothorax.  No acute osseous abnormality.  G lobe.  IMPRESSION: No pulmonary arterial filling defect identified.  Coronary artery calcification.  Nonobstructing right renal stone.  Original Report Authenticated By: Waneta Martins, M.D.   Nm Myocar Multi W/spect W/wall Motion / Ef  12/31/2011  *RADIOLOGY REPORT*  Clinical data: Chest pain, tobacco use  NUCLEAR MEDICINE  MYOCARDIAL PERFUSION IMAGING NUCLEAR MEDICINE LEFT VENTRICULAR WALL MOTION ANALYSIS NUCLEAR MEDICINE LEFT VENTRICULAR EJECTION FRACTION CALCULATION  Technique: Standard single day myocardial SPECT imaging was performed after resting intravenous injection of Tc-64m Myoview. After intravenous infusion of  Lexiscan (regadenoson) under supervision of cardiology staff, Myoview was injected intravenously and standard myocardial SPECT imaging was performed. Quantitative gated imaging was also performed to evaluate left ventricular wall motion and estimate left ventricular ejection fraction.  Radiopharmaceutical: 10+30 mCi Tc62m Myoview IV.  Comparison: 05/09/2005  Findings:    The stress SPECT images demonstrate physiologic distribution of radiopharmaceutical. Rest images demonstrate no perfusion defects. The gated stress SPECT images demonstrate normal left ventricular myocardial thickening.  No focal wall motion abnormality is seen. Calculated left ventricular end-diastolic volume 62ml, end-systolic volume 16ml, ejection fraction of 75%.  IMPRESSION:  1. Negative for pharmacologic-stress induced ischemia.  2. Left ventricular ejection fraction 75%.  Original Report Authenticated By: Osa Craver, M.D.    Cardiac Studies:  Assessment/Plan:  Status post chest pain MI ruled out Coronary artery disease Hypertension uncontrolled History of bilateral renal artery stenosis stenting/ Hypercholesteremia Tobacco abuse Positive family history of coronary artery disease Plan  Continue present management add amlodipine per orders DC heparin and nitroglycerin possible discharge tomorrow  LOS: 1 day    Shalika Arntz N 12/31/2011, 5:31 PM

## 2011-12-31 NOTE — ED Notes (Signed)
Pt in CT.

## 2011-12-31 NOTE — Progress Notes (Signed)
Arrived Nuclear Med at 1115, with [facility]. Hope me report and the patient was monitored throughout procedure. Having some hiccups, no chest discomfort, in sinus rhythm to sinus brad 50-60's with occasional PVC. Tolerated procedure well although tired. Taken to 2036 per w/c and monitored by self. Placed on telemetry in room and report given to [facility].

## 2011-12-31 NOTE — Progress Notes (Signed)
ANTICOAGULATION CONSULT NOTE - Initial Consult  Pharmacy Consult for heparin  Indication: chest pain/ACS  Allergies  Allergen Reactions  . Sulfa Antibiotics Rash    Patient Measurements: ht 5'5" 80.5 kg    Heparin Dosing Weight: 73KG  Vital Signs: Temp: 99.4 F (37.4 C) (03/13 0243) Temp src: Oral (03/13 0243) BP: 130/108 mmHg (03/13 0243) Pulse Rate: 82  (03/13 0243)  Labs:  Alvira Philips 12/30/11 2328 12/30/11 2312  HGB -- 14.9  HCT -- 43.3  PLT -- 219  APTT -- --  LABPROT 14.1 --  INR 1.07 --  HEPARINUNFRC -- --  CREATININE -- 0.82  CKTOTAL -- --  CKMB -- --  TROPONINI -- <0.30   CrCl is unknown because there is no height on file for the current visit.  Medical History: Past Medical History  Diagnosis Date  . Hypertension   . MI (myocardial infarction)   . Retinal hemorrhage, right eye   . Coronary artery disease     Medications:  Prescriptions prior to admission  Medication Sig Dispense Refill  . atenolol (TENORMIN) 25 MG tablet Take 25 mg by mouth daily.      . cloNIDine (CATAPRES) 0.1 MG tablet Take 0.1 mg by mouth daily.       Marland Kitchen co-enzyme Q-10 50 MG capsule Take 150 mg by mouth daily.      . isosorbide mononitrate (IMDUR) 60 MG 24 hr tablet Take 60 mg by mouth daily.      . meloxicam (MOBIC) 15 MG tablet Take 15 mg by mouth daily. Joint pain      . methocarbamol (ROBAXIN) 500 MG tablet Take 500 mg by mouth 4 (four) times daily. Muscle spasms      . Olmesartan-Amlodipine-HCTZ (TRIBENZOR) 40-5-25 MG TABS Take 1 tablet by mouth daily.        Assessment: ACS with abnormal ekg. Hx of MI and PTCA and multiple positive risk factors. Myoview in am.  Goal of Therapy:  Heparin level 0.3-0.7 units/ml   Plan:  1)Heparin 3500 unitsx1 then 1050 units/hr  2) 6 hour HL  3) daily HL and CBC starting 3/14  Janice Coffin 12/31/2011,4:58 AM

## 2011-12-31 NOTE — Progress Notes (Signed)
Pt nauseated and vomited small amount. Gave zofran. Will continue to monitor

## 2011-12-31 NOTE — Progress Notes (Signed)
Nitroglycerin weaned and turned off in preparation for lexiscan. No chest discomfort.

## 2011-12-31 NOTE — H&P (Signed)
Abigail [name] is an 66 y.o. female.   Chief Complaint: Chest pain HPI: Patient is 66 year old female with past medical history significant for coronary artery disease history of MI in the past status post PTCA stenting in 1999 history of bilateral renal artery stenosis currently requiring stenting tobacco abuse positive family history of coronary artery disease she came to the ER complaining of retrosternal chest pain and left-sided chest pain grade 8/10 associated with nausea vomiting patient received sublingual nitroglycerin with partial relief of chest pain. EKG done in the ER showed normal sinus rhythm with ST-T wave changes in inferolateral leads which were more pronounced from prior EKG. Patient presently denies any chest pain patient also was noted to have minimally elevated d-dimer's and subsequently has followed for spiral CT in June of the chest to rule out PE. Patient denies any recent cardiac workup.  Past Medical History  Diagnosis Date  . Hypertension   . MI (myocardial infarction)   . Retinal hemorrhage, right eye   . Coronary artery disease     Past Surgical History  Procedure Date  . Cardiac stent   . Back surgery   . Appendectomy   . Abdominal hysterectomy     No family history on file. Social History:  reports that she has been smoking.  She does not have any smokeless tobacco history on file. She reports that she does not drink alcohol. Her drug history not on file.  Allergies:  Allergies  Allergen Reactions  . Sulfa Antibiotics Rash    Medications Prior to Admission  Medication Dose Route Frequency Provider Last Rate Last Dose  . 0.9 %  sodium chloride infusion   Intravenous Once Gerhard Munch, MD      . famotidine (PEPCID) IVPB 20 mg  20 mg Intravenous Once Gerhard Munch, MD   20 mg at 12/31/11 0009  . gi cocktail (Maalox,Lidocaine,Donnatal)  30 mL Oral Once Gerhard Munch, MD   30 mL at 12/31/11 0007  . nitroGLYCERIN (NITROSTAT) SL tablet 0.4 mg  0.4 mg  Sublingual Q5 Min x 3 PRN Gerhard Munch, MD   0.4 mg at 12/31/11 0009  . ondansetron (ZOFRAN) injection 4 mg  4 mg Intravenous Once Gerhard Munch, MD   4 mg at 12/31/11 0006   No current outpatient prescriptions on file as of 12/30/2011.    Results for orders placed during the hospital encounter of 12/30/11 (from the past 48 hour(s))  CBC     Status: Normal   Collection Time   12/30/11 11:12 PM      Component Value Range Comment   WBC 6.4  4.0 - 10.5 (K/uL)    RBC 4.96  3.87 - 5.11 (MIL/uL)    Hemoglobin 14.9  12.0 - 15.0 (g/dL)    HCT 16.1  09.6 - 04.5 (%)    MCV 87.3  78.0 - 100.0 (fL)    MCH 30.0  26.0 - 34.0 (pg)    MCHC 34.4  30.0 - 36.0 (g/dL)    RDW 40.9  81.1 - 91.4 (%)    Platelets 219  150 - 400 (K/uL)   BASIC METABOLIC PANEL     Status: Abnormal   Collection Time   12/30/11 11:12 PM      Component Value Range Comment   Sodium 135  135 - 145 (mEq/L)    Potassium 3.9  3.5 - 5.1 (mEq/L)    Chloride 98  96 - 112 (mEq/L)    CO2 20  19 - 32 (mEq/L)  Glucose, Bld 162 (*) 70 - 99 (mg/dL)    BUN 28 (*) 6 - 23 (mg/dL)    Creatinine, Ser 6.21  0.50 - 1.10 (mg/dL)    Calcium 30.8  8.4 - 10.5 (mg/dL)    GFR calc non Af Amer 73 (*) >90 (mL/min)    GFR calc Af Amer 85 (*) >90 (mL/min)   TROPONIN I     Status: Normal   Collection Time   12/30/11 11:12 PM      Component Value Range Comment   Troponin I <0.30  <0.30 (ng/mL)   POCT I-STAT TROPONIN I     Status: Normal   Collection Time   12/30/11 11:21 PM      Component Value Range Comment   Troponin i, poc 0.01  0.00 - 0.08 (ng/mL)    Comment 3            PROTIME-INR     Status: Normal   Collection Time   12/30/11 11:28 PM      Component Value Range Comment   Prothrombin Time 14.1  11.6 - 15.2 (seconds)    INR 1.07  0.00 - 1.49    D-DIMER, QUANTITATIVE     Status: Abnormal   Collection Time   12/30/11 11:28 PM      Component Value Range Comment   D-Dimer, Quant 0.75 (*) 0.00 - 0.48 (ug/mL-FEU)    Dg Chest 2  View  12/30/2011  *RADIOLOGY REPORT*  Clinical Data: Left-sided chest pain  CHEST - 2 VIEW  Comparison: 10/04/2010  Findings: Heart size upper normal limits. Atherosclerotic vascular calcifications.  Mediastinal contours otherwise within normal limits. Mild chronic bronchitic change.  No focal consolidation. No pleural effusion or pneumothorax.  No acute osseous abnormality.  IMPRESSION: Mild chronic bronchitic change.  No acute process identified.  Original Report Authenticated By: Waneta Martins, M.D.    Review of Systems  Constitutional: Negative for fever, chills and weight loss.  HENT: Negative for hearing loss, neck pain and tinnitus.   Eyes: Negative for blurred vision, double vision and photophobia.  Respiratory: Negative for cough, hemoptysis and sputum production.   Cardiovascular: Positive for chest pain. Negative for palpitations, orthopnea, claudication and leg swelling.  Gastrointestinal: Positive for nausea and vomiting. Negative for heartburn and abdominal pain.  Musculoskeletal: Negative for back pain.  Skin: Negative for itching and rash.  Neurological: Negative for dizziness and headaches.    Blood pressure 174/72, pulse 71, temperature 98.4 F (36.9 C), temperature source Oral, resp. rate 15, SpO2 100.00%. Physical Exam  Constitutional: She is oriented to person, place, and time. She appears well-developed.  HENT:  Head: Normocephalic.  Eyes: Conjunctivae are normal. Left eye exhibits no discharge. No scleral icterus.  Neck: Normal range of motion. Neck supple. No JVD present. No tracheal deviation present. No thyromegaly present.  Cardiovascular: Normal rate and regular rhythm.  Exam reveals no friction rub.        S1-S2 was normal there was soft S4 gallop  Respiratory: Effort normal and breath sounds normal. No respiratory distress. She has no wheezes. She has no rales. She exhibits no tenderness.  GI: Soft. Bowel sounds are normal. She exhibits no distension.  There is no tenderness. There is no rebound and no guarding.  Musculoskeletal: She exhibits no edema and no tenderness.  Lymphadenopathy:    She has no cervical adenopathy.  Neurological: She is alert and oriented to person, place, and time.     Assessment/Plan Chest pain with abnormal EKG  worrisome for unstable angina rule out MI Coronary artery disease history of MI in the past status post PTCA stenting in the past Hypertension History of bilateral renal artery stenosis Hypercholesteremia Tobacco abuse Positive family history of coronary artery disease  Plan Rule out MI protocol Check serial enzymes and EKGs Start aspirin beta blockers nitrates heparin and statins Schedule for nuclear stress Myoview in a.m. Check old records   Kilyn Maragh N 12/31/2011, 2:02 AM

## 2012-01-01 LAB — CARDIAC PANEL(CRET KIN+CKTOT+MB+TROPI): Relative Index: 3.6 — ABNORMAL HIGH (ref 0.0–2.5)

## 2012-01-01 NOTE — Progress Notes (Signed)
Subjective:  Patient denies any chest pain or shortness of breath Nuclear Myoview scan negative for ischemia Troponin I. a minimally elevated trending down probably secondary to demand ischemia and elevated blood pressure Patient states the is no right to go home her sister can pick up in the morning Objective:  Vital Signs in the last 24 hours: Temp:  [99 F (37.2 C)-99.5 F (37.5 C)] 99.1 F (37.3 C) (03/14 1350) Pulse Rate:  [50-61] 61  (03/14 1350) Resp:  [18] 18  (03/14 1350) BP: (159-187)/(77-96) 172/94 mmHg (03/14 1350) SpO2:  [97 %-100 %] 97 % (03/14 1350)  Intake/Output from previous day: 03/13 0701 - 03/14 0700 In: 1440 [P.O.:1440] Out: 450 [Urine:450] Intake/Output from this shift:    Physical Exam: Neck: no adenopathy, no carotid bruit, no JVD and supple, symmetrical, trachea midline Lungs: clear to auscultation bilaterally Heart: regular rate and rhythm, S1, S2 normal, no murmur, click, rub or gallop Abdomen: soft, non-tender; bowel sounds normal; no masses,  no organomegaly Extremities: extremities normal, atraumatic, no cyanosis or edema  Lab Results:  [facility] 12/31/11 0641 12/30/11 2312  WBC 6.6 6.4  HGB 13.4 14.9  PLT 214 219    Basename 12/31/11 0641 12/30/11 2312  NA 136 135  K 4.1 3.9  CL 101 98  CO2 22 20  GLUCOSE 136* 162*  BUN 24* 28*  CREATININE 0.80 0.82    Basename 01/01/12 0632 12/31/11 1847  TROPONINI 0.38* 0.39*   Hepatic Function Panel  Basename 12/31/11 0641  PROT 9.1*  ALBUMIN 4.1  AST 17  ALT 13  ALKPHOS 65  BILITOT 0.3  BILIDIR --  IBILI --    Basename 12/31/11 0641  CHOL 258*   No results found for this basename: PROTIME in the last 72 hours  Imaging: Imaging results have been reviewed and Dg Chest 2 View  12/30/2011  *RADIOLOGY REPORT*  Clinical Data: Left-sided chest pain  CHEST - 2 VIEW  Comparison: 10/04/2010  Findings: Heart size upper normal limits. Atherosclerotic vascular calcifications.  Mediastinal  contours otherwise within normal limits. Mild chronic bronchitic change.  No focal consolidation. No pleural effusion or pneumothorax.  No acute osseous abnormality.  IMPRESSION: Mild chronic bronchitic change.  No acute process identified.  Original Report Authenticated By: Waneta Martins, M.D.   Ct Angio Chest W/cm &/or Wo Cm  12/31/2011  *RADIOLOGY REPORT*  Clinical Data: Chest pain, shortness of breath  CT ANGIOGRAPHY CHEST  Technique:  Multidetector CT imaging of the chest using the standard protocol during bolus administration of intravenous contrast. Multiplanar reconstructed images including MIPs were obtained and reviewed to evaluate the vascular anatomy.  Contrast: OMNIPAQUE IOHEXOL 350 MG/ML IV SOLN  Comparison: 12/30/2011 radiograph  Findings: No pulmonary arterial branch filling defect is evident. The aorta is unopacified with scattered atherosclerotic calcification.  Normal caliber.  Coronary artery calcification. Heart size within the upper normal limits to mildly enlarged.  No pleural or pericardial effusion.  Small hiatal hernia.  No intrathoracic lymphadenopathy.  Limited images through the upper abdomen demonstrate a nonobstructing stone on the right.  There are several cysts within the left kidney.  Bilateral renal artery stents.  The central airways are patent.  Detailed parenchymal evaluation is degraded by respiratory motion. Within this limitation, no focal areas of consolidation.  No pneumothorax.  No acute osseous abnormality.  G lobe.  IMPRESSION: No pulmonary arterial filling defect identified.  Coronary artery calcification.  Nonobstructing right renal stone.  Original Report Authenticated By: Waneta Martins,  M.D.   Nm Myocar Multi W/spect W/wall Motion / Ef  12/31/2011  *RADIOLOGY REPORT*  Clinical data: Chest pain, tobacco use  NUCLEAR MEDICINE MYOCARDIAL PERFUSION IMAGING NUCLEAR MEDICINE LEFT VENTRICULAR WALL MOTION ANALYSIS NUCLEAR MEDICINE LEFT VENTRICULAR  EJECTION FRACTION CALCULATION  Technique: Standard single day myocardial SPECT imaging was performed after resting intravenous injection of Tc-107m Myoview. After intravenous infusion of Lexiscan (regadenoson) under supervision of cardiology staff, Myoview was injected intravenously and standard myocardial SPECT imaging was performed. Quantitative gated imaging was also performed to evaluate left ventricular wall motion and estimate left ventricular ejection fraction.  Radiopharmaceutical: 10+30 mCi Tc100m Myoview IV.  Comparison: 05/09/2005  Findings:    The stress SPECT images demonstrate physiologic distribution of radiopharmaceutical. Rest images demonstrate no perfusion defects. The gated stress SPECT images demonstrate normal left ventricular myocardial thickening.  No focal wall motion abnormality is seen. Calculated left ventricular end-diastolic volume 62ml, end-systolic volume 16ml, ejection fraction of 75%.  IMPRESSION:  1. Negative for pharmacologic-stress induced ischemia.  2. Left ventricular ejection fraction 75%.  Original Report Authenticated By: Osa Craver, M.D.    Cardiac Studies:  Assessment/Plan:  Status post chest pain MI ruled out negative nuclear stress test Coronary artery disease stable Hypertension History of bilateral renal artery stenosis status post stenting Hypercholesteremia History of tobacco abuse Positive family history of coronary artery disease Plan Continue present management DC home in a.m.  LOS: 2 days    Janney Priego N 01/01/2012, 7:44 PM

## 2012-01-01 NOTE — Progress Notes (Signed)
UR Completed. Simmons, Chalisa Kobler F [phone]  

## 2012-01-02 MED ORDER — NITROGLYCERIN 0.4 MG SL SUBL: 0.4000 mg | SUBLINGUAL_TABLET | SUBLINGUAL | Status: AC | PRN

## 2012-01-02 MED ORDER — ASPIRIN 81 MG PO TBEC: 81.0000 mg | DELAYED_RELEASE_TABLET | Freq: Every day | ORAL | Status: AC

## 2012-01-02 MED ORDER — ATORVASTATIN CALCIUM 80 MG PO TABS: 80.0000 mg | ORAL_TABLET | Freq: Every day | ORAL | Status: DC

## 2012-01-02 MED ORDER — PANTOPRAZOLE SODIUM 40 MG PO TBEC: 40.0000 mg | DELAYED_RELEASE_TABLET | Freq: Every day | ORAL | Status: DC

## 2012-01-02 MED ORDER — OLMESARTAN-AMLODIPINE-HCTZ 40-5-25 MG PO TABS: 1.0000 | ORAL_TABLET | ORAL | Status: DC

## 2012-01-02 MED ORDER — METOPROLOL TARTRATE 25 MG PO TABS: 50.0000 mg | ORAL_TABLET | Freq: Two times a day (BID) | ORAL | Status: DC

## 2012-01-02 NOTE — Discharge Instructions (Signed)
Chest Pain (Nonspecific) It is often hard to give a specific diagnosis for the cause of chest pain. There is always a chance that your pain could be related to something serious, such as a heart attack or a blood clot in the lungs. You need to follow up with your caregiver for further evaluation. CAUSES   Heartburn.   Pneumonia or bronchitis.   Anxiety or stress.   Inflammation around your heart (pericarditis) or lung (pleuritis or pleurisy).   A blood clot in the lung.   A collapsed lung (pneumothorax). It can develop suddenly on its own (spontaneous pneumothorax) or from injury (trauma) to the chest.   Shingles infection (herpes zoster virus).  The chest wall is composed of bones, muscles, and cartilage. Any of these can be the source of the pain.  The bones can be bruised by injury.   The muscles or cartilage can be strained by coughing or overwork.   The cartilage can be affected by inflammation and become sore (costochondritis).  DIAGNOSIS  Lab tests or other studies, such as X-rays, electrocardiography, stress testing, or cardiac imaging, may be needed to find the cause of your pain.  TREATMENT   Treatment depends on what may be causing your chest pain. Treatment may include:   Acid blockers for heartburn.   Anti-inflammatory medicine.   Pain medicine for inflammatory conditions.   Antibiotics if an infection is present.   You may be advised to change lifestyle habits. This includes stopping smoking and avoiding alcohol, caffeine, and chocolate.   You may be advised to keep your head raised (elevated) when sleeping. This reduces the chance of acid going backward from your stomach into your esophagus.   Most of the time, nonspecific chest pain will improve within 2 to 3 days with rest and mild pain medicine.  HOME CARE INSTRUCTIONS   If antibiotics were prescribed, take your antibiotics as directed. Finish them even if you start to feel better.   For the next few  days, avoid physical activities that bring on chest pain. Continue physical activities as directed.   Do not smoke.   Avoid drinking alcohol.   Only take over-the-counter or prescription medicine for pain, discomfort, or fever as directed by your caregiver.   Follow your caregiver's suggestions for further testing if your chest pain does not go away.   Keep any follow-up appointments you made. If you do not go to an appointment, you could develop lasting (chronic) problems with pain. If there is any problem keeping an appointment, you must call to reschedule.  SEEK MEDICAL CARE IF:   You think you are having problems from the medicine you are taking. Read your medicine instructions carefully.   Your chest pain does not go away, even after treatment.   You develop a rash with blisters on your chest.  SEEK IMMEDIATE MEDICAL CARE IF:   You have increased chest pain or pain that spreads to your arm, neck, jaw, back, or abdomen.   You develop shortness of breath, an increasing cough, or you are coughing up blood.   You have severe back or abdominal pain, feel nauseous, or vomit.   You develop severe weakness, fainting, or chills.   You have a fever.  THIS IS AN EMERGENCY. Do not wait to see if the pain will go away. Get medical help at once. Call your local emergency services (911 in [place] Do not drive yourself to the hospital. MAKE SURE YOU:   Understand these instructions.     Will watch your condition.   Will get help right away if you are not doing well or get worse.  Document Released: 07/16/2005 Document Revised: 09/25/2011 Document Reviewed: 05/11/2008 ExitCare Patient Information 2012 ExitCare, LLCChest Pain (Nonspecific) It is often hard to give a specific diagnosis for the cause of chest pain. There is always a chance that your pain could be related to something serious, such as a heart attack or a blood clot in the lungs. You need to follow up with your caregiver for  further evaluation. CAUSES   Heartburn.   Pneumonia or bronchitis.   Anxiety or stress.   Inflammation around your heart (pericarditis) or lung (pleuritis or pleurisy).   A blood clot in the lung.   A collapsed lung (pneumothorax). It can develop suddenly on its own (spontaneous pneumothorax) or from injury (trauma) to the chest.   Shingles infection (herpes zoster virus).  The chest wall is composed of bones, muscles, and cartilage. Any of these can be the source of the pain.  The bones can be bruised by injury.   The muscles or cartilage can be strained by coughing or overwork.   The cartilage can be affected by inflammation and become sore (costochondritis).  DIAGNOSIS  Lab tests or other studies, such as X-rays, electrocardiography, stress testing, or cardiac imaging, may be needed to find the cause of your pain.  TREATMENT   Treatment depends on what may be causing your chest pain. Treatment may include:   Acid blockers for heartburn.   Anti-inflammatory medicine.   Pain medicine for inflammatory conditions.   Antibiotics if an infection is present.   You may be advised to change lifestyle habits. This includes stopping smoking and avoiding alcohol, caffeine, and chocolate.   You may be advised to keep your head raised (elevated) when sleeping. This reduces the chance of acid going backward from your stomach into your esophagus.   Most of the time, nonspecific chest pain will improve within 2 to 3 days with rest and mild pain medicine.  HOME CARE INSTRUCTIONS   If antibiotics were prescribed, take your antibiotics as directed. Finish them even if you start to feel better.   For the next few days, avoid physical activities that bring on chest pain. Continue physical activities as directed.   Do not smoke.   Avoid drinking alcohol.   Only take over-the-counter or prescription medicine for pain, discomfort, or fever as directed by your caregiver.   Follow your  caregiver's suggestions for further testing if your chest pain does not go away.   Keep any follow-up appointments you made. If you do not go to an appointment, you could develop lasting (chronic) problems with pain. If there is any problem keeping an appointment, you must call to reschedule.  SEEK MEDICAL CARE IF:   You think you are having problems from the medicine you are taking. Read your medicine instructions carefully.   Your chest pain does not go away, even after treatment.   You develop a rash with blisters on your chest.  SEEK IMMEDIATE MEDICAL CARE IF:   You have increased chest pain or pain that spreads to your arm, neck, jaw, back, or abdomen.   You develop shortness of breath, an increasing cough, or you are coughing up blood.   You have severe back or abdominal pain, feel nauseous, or vomit.   You develop severe weakness, fainting, or chills.   You have a fever.  THIS IS AN EMERGENCY. Do not wait to   see if the pain will go away. Get medical help at once. Call your local emergency services (911 in [place] Do not drive yourself to the hospital. MAKE SURE YOU:   Understand these instructions.   Will watch your condition.   Will get help right away if you are not doing well or get worse.  Document Released: 07/16/2005 Document Revised: 09/25/2011 Document Reviewed: 05/11/2008 ExitCare Patient Information 2012 [employer].. 

## 2012-01-02 NOTE — Discharge Summary (Signed)
Abigail [name], Abigail [name] NO.:  [id number]  MEDICAL RECORD NO.:  [id number]  LOCATION:  2036                         FACILITY:  [facility]  PHYSICIAN:  Eduardo Osier. Sharyn Lull, M.D. DATE OF BIRTH:  06/21/1946  DATE OF ADMISSION:  12/31/2011 DATE OF DISCHARGE:  01/02/2012                              DISCHARGE SUMMARY   ADMITTING DIAGNOSES: 1. Chest pain with abnormal EKG, worrisome for unstable angina, rule     out myocardial infarction, coronary artery disease, history of     myocardial infarction in the past status post percutaneous     transluminal coronary angioplasty stenting in the past. 2. Hypertension. 3. History of bilateral renal artery stenosis/stenting. 4. Hypercholesteremia. 5. Tobacco abuse. 6. Positive family history of coronary artery disease.  DISCHARGE DIAGNOSES: 1. Stable angina, negative Lexiscan Myoview stress test, coronary     artery disease, history of myocardial infarction in the past status     post percutaneous transluminal coronary angioplasty stenting in the     past. 2. Hypertension. 3. Hypercholesteremia. 4. History of tobacco abuse. 5. History of bilateral renal artery stenosis status post stenting. 6. Positive family history of coronary artery disease.  DISCHARGE HOME MEDICATIONS: 1. Enteric-coated aspirin 81 mg 1 tablet daily. 2. Atorvastatin 80 mg 1 tablet daily. 3. Metoprolol tartrate 50 mg twice daily. 4. Nitrostat 0.4 mg sublingual, use as directed. 5. Protonix 40 mg 1 tab daily. 6. Tribenzor 45/25 one tab daily. 7. Coenzyme Q10 - 150 mg daily. 8. Isosorbide mononitrate 60 mg 1 tablet daily. 9. Meloxicam 15 mg 1 tablet daily. 10.Robaxin 500 mg 4 times daily for muscle spasm.  The patient has been advised to stop atenolol, clonidine, and isosorbide dinitrate.  Follow up with Dr. Algie Coffer in 1 week.  CONDITION AT DISCHARGE:  Stable.  BRIEF HISTORY AND COURSE:  Abigail [name] is a 66 year old female with past medical history  significant for coronary artery disease, history of MI in the past status post PTCA stenting in 1999, history of bilateral renal artery stenosis and subsequently had stenting, tobacco abuse, positive family history of coronary artery disease.  She came to the ER complaining of retrosternal chest pain and also left-sided chest pain, grade 8/10, associated with nausea, vomiting.  The patient received sublingual nitro with partial relief of chest pain.  EKG done in the ER showed normal sinus rhythm with ST-T wave changes in inferolateral leads which were more pronounced as compared to prior EKG.  The patient when seen in the ER denied any chest pain, was noted to have minimally elevated D-dimer, and subsequently had spiral CT angio of the chest, which was negative for PE.  The patient denies any recent cardiac workup.  PAST MEDICAL HISTORY:  As above.  PAST SURGICAL HISTORY:  She had a PTCA stenting and also bilateral renal artery stenting in the past, had back surgery in the past, appendectomy in the past, had abdominal hysterectomy in the past.  PHYSICAL EXAMINATION:  VITAL SIGNS:  Her blood pressure was 174/72, pulse was 71.  She was afebrile. HEENT:  Conjunctivae were pink. NECK:  Supple.  No JVD.  No bruit. LUNGS:  Clear to auscultation  without rhonchi or rales. CARDIOVASCULAR:  S1, S2 were normal.  There was soft S4 gallop.  There was no rub. ABDOMEN:  Soft.  Bowel sounds are present.  Nontender. EXTREMITIES:  There is no clubbing, cyanosis, or edema.  LABORATORY DATA:  Sodium was 141, potassium 3.7, BUN 17, creatinine 0.80, glucose was 103.  Two sets of troponin I, 1st set negative, 3rd set was minimally elevated at 0.38, next set was 0.39, and repeat was 0.38.  Her total CK was 74, MB 2.8.  Repeat CK 107, MB 4.9.  Next set CK 114, MB 4.8.  Next set CK 129, MB 4.6.  Her cholesterol was very high at 258, LDL of 162, HDL was 81, triglycerides 76.  CT angio was negative for  pulmonary embolism.  Nuclear stress test was negative for stress-induced ischemia with EF of 75%.  BRIEF HOSPITAL COURSE:  The patient was admitted to telemetry unit.  MI was ruled out by serial enzymes and EKG.  The patient did not have any further episodes of chest pain during the hospital stay.  The patient subsequently underwent Lexiscan Myoview stress test which showed no evidence of ischemia or infarction with normal LV systolic function. The patient is ambulating in room without any problems.  Her cardiac enzymes 2 sets were negative.  Troponin I was minimally elevated which was felt to be due to demand ischemia and uncontrolled hypertension. The patient will be discharged home on above medications and will be followed up by Dr. Algie Coffer in 1 week.     Eduardo Osier. Sharyn Lull, M.D.     [facility]  D:  01/02/2012  T:  01/02/2012  Job:  [id number]  cc:   Ricki Rodriguez, M.D.

## 2012-01-02 NOTE — Consult Note (Signed)
Pt says she's cut down from 1 ppd to 1/2 ppd and this is a good time for her to quit. She plans to quit cold [place]. Encouraged pt to quit. Discussed and mentioned ways to quit with pharmacotherapy if cold [place] doesn't work out for her. Referred to 1-800 quit now for f/u and support. Discussed oral fixation substitutes, second hand smoke and in home smoking policy. Reviewed and gave pt Written education/contact information.

## 2012-10-20 HISTORY — PX: CATARACT EXTRACTION W/ INTRAOCULAR LENS IMPLANT: SHX1309

## 2013-09-07 ENCOUNTER — Encounter (HOSPITAL_COMMUNITY): Payer: Self-pay

## 2014-09-01 ENCOUNTER — Other Ambulatory Visit: Payer: Self-pay | Admitting: Cardiovascular Disease

## 2014-09-01 DIAGNOSIS — Z1231 Encounter for screening mammogram for malignant neoplasm of breast: Secondary | ICD-10-CM

## 2014-09-27 ENCOUNTER — Ambulatory Visit
Admission: RE | Admit: 2014-09-27 | Discharge: 2014-09-27 | Disposition: A | Discharge status: Home / Self Care | Payer: Medicare Other | Source: Ambulatory Visit | Attending: Cardiovascular Disease | Admitting: Cardiovascular Disease

## 2014-09-27 DIAGNOSIS — Z1231 Encounter for screening mammogram for malignant neoplasm of breast: Secondary | ICD-10-CM

## 2014-09-29 ENCOUNTER — Other Ambulatory Visit: Payer: Self-pay | Admitting: Cardiovascular Disease

## 2014-09-29 DIAGNOSIS — R928 Other abnormal and inconclusive findings on diagnostic imaging of breast: Secondary | ICD-10-CM

## 2014-10-18 ENCOUNTER — Ambulatory Visit
Admission: RE | Admit: 2014-10-18 | Discharge: 2014-10-18 | Disposition: A | Discharge status: Home / Self Care | Payer: Medicare Other | Source: Ambulatory Visit | Attending: Cardiovascular Disease | Admitting: Cardiovascular Disease

## 2014-10-18 DIAGNOSIS — R928 Other abnormal and inconclusive findings on diagnostic imaging of breast: Secondary | ICD-10-CM

## 2015-04-11 ENCOUNTER — Other Ambulatory Visit: Payer: Self-pay | Admitting: Cardiovascular Disease

## 2015-04-11 DIAGNOSIS — N6310 Unspecified lump in the right breast, unspecified quadrant: Secondary | ICD-10-CM

## 2015-04-18 ENCOUNTER — Ambulatory Visit
Admission: RE | Admit: 2015-04-18 | Discharge: 2015-04-18 | Disposition: A | Discharge status: Home / Self Care | Payer: Commercial Managed Care - HMO | Source: Ambulatory Visit | Attending: Cardiovascular Disease | Admitting: Cardiovascular Disease

## 2015-04-18 DIAGNOSIS — N6310 Unspecified lump in the right breast, unspecified quadrant: Secondary | ICD-10-CM

## 2015-07-21 DIAGNOSIS — D649 Anemia, unspecified: Secondary | ICD-10-CM

## 2015-07-21 HISTORY — DX: Anemia, unspecified: D64.9

## 2015-07-24 ENCOUNTER — Emergency Department (HOSPITAL_COMMUNITY): Payer: Commercial Managed Care - HMO

## 2015-07-24 ENCOUNTER — Emergency Department (HOSPITAL_COMMUNITY)
Admission: EM | Admit: 2015-07-24 | Discharge: 2015-07-24 | Disposition: A | Discharge status: Home / Self Care | Payer: Commercial Managed Care - HMO | Attending: Emergency Medicine | Admitting: Emergency Medicine

## 2015-07-24 ENCOUNTER — Encounter (HOSPITAL_COMMUNITY): Payer: Self-pay | Admitting: Emergency Medicine

## 2015-07-24 DIAGNOSIS — I255 Ischemic cardiomyopathy: Secondary | ICD-10-CM | POA: Insufficient documentation

## 2015-07-24 DIAGNOSIS — M940 Chondrocostal junction syndrome [Tietze]: Secondary | ICD-10-CM | POA: Insufficient documentation

## 2015-07-24 DIAGNOSIS — I10 Essential (primary) hypertension: Secondary | ICD-10-CM | POA: Insufficient documentation

## 2015-07-24 DIAGNOSIS — I2510 Atherosclerotic heart disease of native coronary artery without angina pectoris: Secondary | ICD-10-CM | POA: Diagnosis not present

## 2015-07-24 DIAGNOSIS — Z720 Tobacco use: Secondary | ICD-10-CM | POA: Insufficient documentation

## 2015-07-24 DIAGNOSIS — Z79899 Other long term (current) drug therapy: Secondary | ICD-10-CM | POA: Insufficient documentation

## 2015-07-24 DIAGNOSIS — Z7982 Long term (current) use of aspirin: Secondary | ICD-10-CM | POA: Diagnosis not present

## 2015-07-24 DIAGNOSIS — R079 Chest pain, unspecified: Secondary | ICD-10-CM | POA: Diagnosis present

## 2015-07-24 LAB — BASIC METABOLIC PANEL
Anion gap: 3 — ABNORMAL LOW (ref 5–15)
BUN: 9 mg/dL (ref 6–20)
CHLORIDE: 107 mmol/L (ref 101–111)
CO2: 24 mmol/L (ref 22–32)
CREATININE: 1 mg/dL (ref 0.44–1.00)
Calcium: 9.3 mg/dL (ref 8.9–10.3)
GFR calc non Af Amer: 57 mL/min — ABNORMAL LOW (ref >60)
GLUCOSE: 111 mg/dL — AB (ref 65–99)
Potassium: 4.1 mmol/L (ref 3.5–5.1)
Sodium: 134 mmol/L — ABNORMAL LOW (ref 135–145)

## 2015-07-24 LAB — I-STAT TROPONIN, ED
Troponin i, poc: 0.02 ng/mL (ref 0.00–0.08)
Troponin i, poc: 0.01 ng/mL (ref 0.00–0.08)

## 2015-07-24 LAB — CBC
HCT: 25.8 % — ABNORMAL LOW (ref 36.0–46.0)
HEMOGLOBIN: 8.2 g/dL — AB (ref 12.0–15.0)
MCH: 27.9 pg (ref 26.0–34.0)
MCHC: 31.8 g/dL (ref 30.0–36.0)
MCV: 87.8 fL (ref 78.0–100.0)
PLATELETS: 150 10*3/uL (ref 150–400)
RBC: 2.94 MIL/uL — AB (ref 3.87–5.11)
RDW: 19 % — ABNORMAL HIGH (ref 11.5–15.5)
WBC: 6 10*3/uL (ref 4.0–10.5)

## 2015-07-24 MED ORDER — TIZANIDINE HCL 2 MG PO TABS: 2.0000 mg | ORAL_TABLET | Freq: Four times a day (QID) | ORAL | Status: DC | PRN

## 2015-07-24 MED ORDER — IOHEXOL 350 MG/ML SOLN
100.0000 mL | Freq: Once | INTRAVENOUS | Status: AC | PRN
  Administered 2015-07-24: 100 mL via INTRAVENOUS

## 2015-07-24 MED ORDER — MORPHINE SULFATE (PF) 4 MG/ML IV SOLN
4.0000 mg | Freq: Once | INTRAVENOUS | Status: AC
  Administered 2015-07-24: 4 mg via INTRAVENOUS
  Filled 2015-07-24: qty 1

## 2015-07-24 MED ORDER — IOHEXOL 300 MG/ML  SOLN: 25.0000 mL | INTRAMUSCULAR | Status: AC

## 2015-07-24 MED ORDER — IOHEXOL 300 MG/ML  SOLN: 25.0000 mL | INTRAMUSCULAR | Status: DC

## 2015-07-24 NOTE — ED Notes (Signed)
Doctor at bedside.

## 2015-07-24 NOTE — Discharge Instructions (Signed)

## 2015-07-24 NOTE — ED Notes (Signed)
Pt stable, ambulatory, states understanding of discharge instructions 

## 2015-07-24 NOTE — ED Notes (Signed)
EKG completed given to EDP.  

## 2015-07-24 NOTE — ED Notes (Signed)
Notified CT patient is ready for CT.

## 2015-07-24 NOTE — ED Provider Notes (Signed)
CSN: [id number]     Arrival date & time 07/24/15  6295 History   First MD Initiated Contact with Patient 07/24/15 [phone]     Chief Complaint  Patient presents with  . Rib Injury     (Consider location/radiation/quality/duration/timing/severity/associated sxs/prior Treatment) Patient is a 69 y.o. female presenting with chest pain.  Chest Pain Chest pain location: L inferior costal margin. Pain quality: sharp   Pain radiates to:  Does not radiate Pain radiates to the back: no   Pain severity:  Moderate Onset quality:  Gradual Duration:  4 days Timing:  Intermittent Progression:  Unchanged Chronicity:  New Context comment:  Started at gym Relieved by:  Nothing Worsened by:  Deep breathing and movement Ineffective treatments:  None tried   Past Medical History  Diagnosis Date  . Hypertension   . MI (myocardial infarction) ([place])   . Retinal hemorrhage, right eye   . Coronary artery disease    Past Surgical History  Procedure Laterality Date  . Cardiac stent    . Back surgery    . Appendectomy    . Abdominal hysterectomy     No family history on file. Social History  Substance Use Topics  . Smoking status: Current Every Day Smoker  . Smokeless tobacco: None  . Alcohol Use: No   OB History    No data available     Review of Systems  Cardiovascular: Positive for chest pain.      Allergies  Sulfa antibiotics  Home Medications   Prior to Admission medications   Medication Sig Start Date End Date Taking? Authorizing Provider  amLODipine (NORVASC) 5 MG tablet Take 5 mg by mouth daily. 07/13/15  Yes Historical Provider, MD  aspirin 81 MG tablet Take 81 mg by mouth daily.   Yes Historical Provider, MD  Cholecalciferol (VITAMIN D PO) Take 1 capsule by mouth daily.   Yes Historical Provider, MD  hydrochlorothiazide (HYDRODIURIL) 25 MG tablet Take 25 mg by mouth daily. 07/13/15  Yes Historical Provider, MD  IRON PO Take 1 tablet by mouth every evening.   Yes  Historical Provider, MD  isosorbide mononitrate (IMDUR) 60 MG 24 hr tablet Take 60 mg by mouth 2 (two) times daily.    Yes Historical Provider, MD  KLOR-CON 10 10 MEQ tablet Take 10 mEq by mouth daily. 05/15/15  Yes Historical Provider, MD  losartan (COZAAR) 100 MG tablet Take 100 mg by mouth daily. 07/13/15  Yes Historical Provider, MD  pantoprazole (PROTONIX) 40 MG tablet Take 40 mg by mouth daily as needed (acid reflux).  07/22/15  Yes Historical Provider, MD  pravastatin (PRAVACHOL) 40 MG tablet Take 40 mg by mouth every evening. 06/03/15  Yes Historical Provider, MD  Olmesartan-Amlodipine-HCTZ (TRIBENZOR) 40-5-25 MG TABS Take 1 tablet by mouth 1 day or 1 dose. Patient not taking: Reported on 07/24/2015 01/02/12   Charolette Forward, MD  tiZANidine (ZANAFLEX) 2 MG tablet Take 1 tablet (2 mg total) by mouth every 6 (six) hours as needed for muscle spasms. 07/24/15   Debby Freiberg, MD   BP 119/46 mmHg  Pulse 47  Temp(Src) 98.3 F (36.8 C) (Oral)  Resp 17  Ht 5\' 5"  (1.651 m)  Wt 208 lb (94.348 kg)  BMI 34.61 kg/m2  SpO2 95% Physical Exam  Constitutional: She is oriented to person, place, and time. She appears well-developed and well-nourished.  HENT:  Head: Normocephalic and atraumatic.  Right Ear: External ear normal.  Left Ear: External ear normal.  Eyes: Conjunctivae and  EOM are normal. Pupils are equal, round, and reactive to light.  Neck: Normal range of motion. Neck supple.  Cardiovascular: Normal rate, regular rhythm, normal heart sounds and intact distal pulses.   Pulmonary/Chest: Effort normal and breath sounds normal.  Abdominal: Soft. Bowel sounds are normal. There is no tenderness.  Musculoskeletal: Normal range of motion.  Neurological: She is alert and oriented to person, place, and time.  Skin: Skin is warm and dry.  Vitals reviewed.   ED Course  Procedures (including critical care time) Labs Review Labs Reviewed  BASIC METABOLIC PANEL - Abnormal; Notable for the  following:    Sodium 134 (*)    Glucose, Bld 111 (*)    GFR calc non Af Amer 57 (*)    Anion gap 3 (*)    All other components within normal limits  CBC - Abnormal; Notable for the following:    RBC 2.94 (*)    Hemoglobin 8.2 (*)    HCT 25.8 (*)    RDW 19.0 (*)    All other components within normal limits  I-STAT TROPOININ, ED  Randolm Idol, ED    Imaging Review Dg Chest 2 View  07/24/2015   CLINICAL DATA:  Sharp pain under left breast since Sunday. History of smoking. Hypertension.  EXAM: CHEST  2 VIEW  COMPARISON:  12/30/2011  FINDINGS: Linear densities in both lung bases, likely atelectasis. Heart is borderline in size. No effusions. No acute bony abnormality.  IMPRESSION: Bibasilar atelectasis.   Electronically Signed   By: Rolm Baptise M.D.   On: 07/24/2015 10:44   Ct Angio Chest Pe W/cm &/or Wo Cm  07/24/2015   CLINICAL DATA:  Left rib pain after working out 4 days ago. Pleuritic pain.  EXAM: CT ANGIOGRAPHY CHEST  CT ABDOMEN AND PELVIS WITH CONTRAST  TECHNIQUE: Multidetector CT imaging of the chest was performed using the standard protocol during bolus administration of intravenous contrast. Multiplanar CT image reconstructions and MIPs were obtained to evaluate the vascular anatomy. Multidetector CT imaging of the abdomen and pelvis was performed using the standard protocol during bolus administration of intravenous contrast.  CONTRAST:  158mL OMNIPAQUE IOHEXOL 350 MG/ML SOLN  COMPARISON:  12/31/2011.  FINDINGS: CTA CHEST FINDINGS  No filling defects in the pulmonary arteries to suggest pulmonary emboli. Heart is mildly enlarged. Coronary artery calcifications in all 3 major coronary vessels. Aorta is normal caliber with scattered calcifications. No mediastinal, hilar, or axillary adenopathy. Chest wall soft tissues are unremarkable.  Linear areas of scarring or atelectasis in the lower lobes. No confluent opacities. No pleural effusions.  No acute bony abnormality or focal bone  lesion. No rib fracture or pneumothorax.  CT ABDOMEN and PELVIS FINDINGS  Liver, gallbladder, spleen, pancreas, adrenals are unremarkable. Multiple cysts within the left kidney. Area of cortical thinning and scarring in the lower pole of the right kidney. No hydronephrosis.  Prior hysterectomy. No adnexal masses. Urinary bladder is unremarkable.  Stomach, large and small bowel unremarkable. No free fluid, free air or adenopathy. Aorta is heavily calcified, non aneurysmal.  No acute bony abnormality or focal bone lesion. Degenerative changes in the lumbar spine.  Review of the MIP images confirms the above findings.  IMPRESSION: No evidence of pulmonary embolus.  Coronary artery disease.  Aortic atherosclerosis.  Scarring or atelectasis in the lower lobes.  No acute findings in the chest, abdomen or pelvis.   Electronically Signed   By: Rolm Baptise M.D.   On: 07/24/2015 15:31   Ct  Abdomen Pelvis W Contrast  07/24/2015   CLINICAL DATA:  Left rib pain after working out 4 days ago. Pleuritic pain.  EXAM: CT ANGIOGRAPHY CHEST  CT ABDOMEN AND PELVIS WITH CONTRAST  TECHNIQUE: Multidetector CT imaging of the chest was performed using the standard protocol during bolus administration of intravenous contrast. Multiplanar CT image reconstructions and MIPs were obtained to evaluate the vascular anatomy. Multidetector CT imaging of the abdomen and pelvis was performed using the standard protocol during bolus administration of intravenous contrast.  CONTRAST:  153mL OMNIPAQUE IOHEXOL 350 MG/ML SOLN  COMPARISON:  12/31/2011.  FINDINGS: CTA CHEST FINDINGS  No filling defects in the pulmonary arteries to suggest pulmonary emboli. Heart is mildly enlarged. Coronary artery calcifications in all 3 major coronary vessels. Aorta is normal caliber with scattered calcifications. No mediastinal, hilar, or axillary adenopathy. Chest wall soft tissues are unremarkable.  Linear areas of scarring or atelectasis in the lower lobes. No  confluent opacities. No pleural effusions.  No acute bony abnormality or focal bone lesion. No rib fracture or pneumothorax.  CT ABDOMEN and PELVIS FINDINGS  Liver, gallbladder, spleen, pancreas, adrenals are unremarkable. Multiple cysts within the left kidney. Area of cortical thinning and scarring in the lower pole of the right kidney. No hydronephrosis.  Prior hysterectomy. No adnexal masses. Urinary bladder is unremarkable.  Stomach, large and small bowel unremarkable. No free fluid, free air or adenopathy. Aorta is heavily calcified, non aneurysmal.  No acute bony abnormality or focal bone lesion. Degenerative changes in the lumbar spine.  Review of the MIP images confirms the above findings.  IMPRESSION: No evidence of pulmonary embolus.  Coronary artery disease.  Aortic atherosclerosis.  Scarring or atelectasis in the lower lobes.  No acute findings in the chest, abdomen or pelvis.   Electronically Signed   By: Rolm Baptise M.D.   On: 07/24/2015 15:31   I have personally reviewed and evaluated these images and lab results as part of my medical decision-making.   EKG Interpretation   Date/Time:  Tuesday July 24 2015 09:56:59 EDT Ventricular Rate:  67 PR Interval:  146 QRS Duration: 87 QT Interval:  453 QTC Calculation: 478 R Axis:   48 Text Interpretation:  Sinus rhythm Borderline repolarization abnormality  No significant change since last tracing Confirmed by Debby Freiberg  [phone]) on 07/24/2015 11:27:43 AM      Date: 07/24/2015  Rate: 67  Rhythm: normal sinus rhythm  QRS Axis: normal  Intervals: normal  ST/T Wave abnormalities: normal  Conduction Disutrbances: none  Narrative Interpretation: unremarkable    Emergency Ultrasound Study:   Angiocath insertion Performed by: Debby Freiberg  Consent: Verbal consent obtained. Risks and benefits: risks, benefits and alternatives were discussed Immediately prior to procedure the correct patient, procedure, equipment, support  staff and site/side marked as needed.  Indication: difficult IV access Preparation: Patient was prepped and draped in the usual sterile fashion. Vein Location: R brachial vein was visualized during assessment for potential access sites and was found to be patent/ easily compressed with linear ultrasound.  The needle was visualized with real-time ultrasound and guided into the vein. Gauge: 20  Image saved and stored.  Normal blood return.  Patient tolerance: Patient tolerated the procedure well with no immediate complications.     MDM   Final diagnoses:  Costochondritis    69 y.o. female with pertinent PMH of CAD presents with L inferior chest wall pain.  Pt states pain is worse with movement primarily, located LUQ as well.  Unable to reproduce pain.  No ho anemia in records, pt states she is intermittently anemic.  No fatigue, dyspnea, or exertional symptoms otherwise.  Physical exam otherwise benign.  No darkened stool or blood loss.  Wu obtained as above due to questionable, nonreproducible nature of pain, which was unremarkable.    CT scans unremarkable.  Delta trop negative.  DC home in stable condition, likely costochondritis  I have reviewed all laboratory and imaging studies if ordered as above  1. Costochondritis         Debby Freiberg, MD 07/25/15 1058

## 2015-07-24 NOTE — ED Notes (Signed)
Onset 4 days ago after working out developed left rib pain resolved and then returned 1-2 days ago. Pain worsening with certain movements and coughing.  Airway intact bilateral equal chest rise and fall.

## 2015-07-24 NOTE — ED Notes (Signed)
Multiple attempts made for IV start by 2 nurses.  ED Doctor notified and will attempt ultrasound IV.

## 2015-08-25 ENCOUNTER — Other Ambulatory Visit: Payer: Self-pay | Admitting: Internal Medicine

## 2015-08-25 DIAGNOSIS — N63 Unspecified lump in unspecified breast: Secondary | ICD-10-CM

## 2015-08-25 DIAGNOSIS — E2839 Other primary ovarian failure: Secondary | ICD-10-CM

## 2015-09-27 ENCOUNTER — Other Ambulatory Visit: Payer: Commercial Managed Care - HMO

## 2015-10-02 ENCOUNTER — Ambulatory Visit
Admission: RE | Admit: 2015-10-02 | Discharge: 2015-10-02 | Disposition: A | Discharge status: Home / Self Care | Payer: Medicare HMO | Source: Ambulatory Visit | Attending: Internal Medicine | Admitting: Internal Medicine

## 2015-10-02 DIAGNOSIS — N63 Unspecified lump in unspecified breast: Secondary | ICD-10-CM

## 2015-10-02 DIAGNOSIS — E2839 Other primary ovarian failure: Secondary | ICD-10-CM

## 2015-10-05 ENCOUNTER — Ambulatory Visit (AMBULATORY_SURGERY_CENTER): Payer: Self-pay | Admitting: *Deleted

## 2015-10-05 VITALS — Ht 65.0 in | Wt 205.0 lb

## 2015-10-05 DIAGNOSIS — Z1211 Encounter for screening for malignant neoplasm of colon: Secondary | ICD-10-CM

## 2015-10-05 MED ORDER — NA SULFATE-K SULFATE-MG SULF 17.5-3.13-1.6 GM/177ML PO SOLN: 1.0000 | Freq: Once | ORAL | Status: DC

## 2015-10-05 NOTE — Progress Notes (Signed)
No egg or soy allergy. No anesthesia problems.  No home O2.  No diet meds.  

## 2015-10-19 ENCOUNTER — Ambulatory Visit (AMBULATORY_SURGERY_CENTER): Payer: Medicare HMO | Admitting: Internal Medicine

## 2015-10-19 ENCOUNTER — Encounter: Payer: Self-pay | Admitting: Internal Medicine

## 2015-10-19 VITALS — BP 159/64 | HR 64 | Temp 98.5°F | Resp 28 | Ht 65.0 in | Wt 205.0 lb

## 2015-10-19 DIAGNOSIS — D123 Benign neoplasm of transverse colon: Secondary | ICD-10-CM | POA: Diagnosis not present

## 2015-10-19 DIAGNOSIS — D124 Benign neoplasm of descending colon: Secondary | ICD-10-CM | POA: Diagnosis not present

## 2015-10-19 DIAGNOSIS — Z1211 Encounter for screening for malignant neoplasm of colon: Secondary | ICD-10-CM

## 2015-10-19 MED ORDER — SODIUM CHLORIDE 0.9 % IV SOLN: 500.0000 mL | INTRAVENOUS | Status: DC

## 2015-10-19 NOTE — Progress Notes (Signed)
No complaints noted in the recovery room. maw

## 2015-10-19 NOTE — Op Note (Signed)
[place]  [address] [place], [address]   COLONOSCOPY PROCEDURE REPORT  PATIENT: Abigail [name], Abigail [name]  MR#: [id number] BIRTHDATE: 1945-12-09 , 69  yrs. old GENDER: female ENDOSCOPIST: Jerene Bears, MD REFERRED [id number] Jeanie Cooks, M.D. PROCEDURE DATE:  10/19/2015 PROCEDURE:   Colonoscopy, screening and Colonoscopy with snare polypectomy First Screening Colonoscopy - Avg.  risk and is 50 yrs.  old or older Yes.  Prior Negative Screening - Now for repeat screening. N/A  History of Adenoma - Now for follow-up colonoscopy & has been > or = to 3 yrs.  N/A  Polyps removed today? Yes ASA CLASS:   Class III INDICATIONS:Screening for colonic neoplasia, Colorectal Neoplasm Risk Assessment for this procedure is average risk, and 1st colonoscopy. MEDICATIONS: Monitored anesthesia care and Propofol 200 mg IV  DESCRIPTION OF PROCEDURE:   After the risks benefits and alternatives of the procedure were thoroughly explained, informed consent was obtained.  The digital rectal exam revealed no rectal mass.   The LB [id number]  endoscope was introduced through the anus and advanced to the cecum, which was identified by both the appendix and ileocecal valve. No adverse events experienced. The quality of the prep was good.  (Suprep was used)  The instrument was then slowly withdrawn as the colon was fully examined. Estimated blood loss is zero unless otherwise noted in this procedure report.  COLON FINDINGS: Two sessile polyps measuring 5 mm in size were found in the transverse colon and descending colon.  Polypectomies were performed with a cold snare.  The resection was complete, the polyp tissue was completely retrieved and sent to histology.   There was moderate diverticulosis noted in the left colon.  Retroflexed views revealed no abnormalities. The time to cecum = 5.3 Withdrawal time = 8.7   The scope was withdrawn and the procedure completed. COMPLICATIONS: There were no  immediate complications.  ENDOSCOPIC IMPRESSION: 1.   Two sessile polyps were found in the transverse colon and descending colon; polypectomies were performed with a cold snare 2.   Moderate diverticulosis was noted in the left colon  RECOMMENDATIONS: 1.  Await pathology results 2.  High fiber diet 3.  Timing of repeat colonoscopy will be determined by pathology findings. 4.  You will receive a letter within 1-2 weeks with the results of your biopsy as well as final recommendations.  Please call my office if you have not received a letter after 3 weeks.  eSigned:  Jerene Bears, MD 10/19/2015 12:10 PM   330cc:  the patient, PCP

## 2015-10-19 NOTE — Progress Notes (Signed)
Patient awakening,vss,report to rn 

## 2015-10-19 NOTE — Progress Notes (Signed)
Called to room to assist during endoscopic procedure.  Patient ID and intended procedure confirmed with present staff. Received instructions for my participation in the procedure from the performing physician.  

## 2015-10-19 NOTE — Patient Instructions (Signed)
YOU HAD AN ENDOSCOPIC PROCEDURE TODAY AT THE [facility]:   Refer to the procedure report that was given to you for any specific questions about what was found during the examination.  If the procedure report does not answer your questions, please call your gastroenterologist to clarify.  If you requested that your care partner not be given the details of your procedure findings, then the procedure report has been included in a sealed envelope for you to review at your convenience later.  YOU SHOULD EXPECT: Some feelings of bloating in the abdomen. Passage of more gas than usual.  Walking can help get rid of the air that was put into your GI tract during the procedure and reduce the bloating. If you had a lower endoscopy (such as a colonoscopy or flexible sigmoidoscopy) you may notice spotting of blood in your stool or on the toilet paper. If you underwent a bowel prep for your procedure, you may not have a normal bowel movement for a few days.  Please Note:  You might notice some irritation and congestion in your nose or some drainage.  This is from the oxygen used during your procedure.  There is no need for concern and it should clear up in a day or so.  SYMPTOMS TO REPORT IMMEDIATELY:   Following lower endoscopy (colonoscopy or flexible sigmoidoscopy):  Excessive amounts of blood in the stool  Significant tenderness or worsening of abdominal pains  Swelling of the abdomen that is new, acute  Fever of 100F or higher  For urgent or emergent issues, a gastroenterologist can be reached at any hour by calling [phone].   DIET: Your first meal following the procedure should be a small meal and then it is ok to progress to your normal diet. Heavy or fried foods are harder to digest and may make you feel nauseous or bloated.  Likewise, meals heavy in dairy and vegetables can increase bloating.  Drink plenty of fluids but you should avoid alcoholic beverages for 24  hours.  ACTIVITY:  You should plan to take it easy for the rest of today and you should NOT DRIVE or use heavy machinery until tomorrow (because of the sedation medicines used during the test).    FOLLOW UP: Our staff will call the number listed on your records the next business day following your procedure to check on you and address any questions or concerns that you may have regarding the information given to you following your procedure. If we do not reach you, we will leave a message.  However, if you are feeling well and you are not experiencing any problems, there is no need to return our call.  We will assume that you have returned to your regular daily activities without incident.  If any biopsies were taken you will be contacted by phone or by letter within the next 1-3 weeks.  Please call us at [phone] if you have not heard about the biopsies in 3 weeks.    SIGNATURES/CONFIDENTIALITY: You and/or your care partner have signed paperwork which will be entered into your electronic medical record.  These signatures attest to the fact that that the information above on your After Visit Summary has been reviewed and is understood.  Full responsibility of the confidentiality of this discharge information lies with you and/or your care-partner.    Handouts were given to your care partner on polyps, diverticulosis, and a high fiber diet with liberal fluid intake. You may resume your   current medications today. Await biopsy results. Please call if any questions or concerns.

## 2015-10-23 ENCOUNTER — Telehealth: Payer: Self-pay

## 2015-10-23 NOTE — Telephone Encounter (Signed)
  Follow up Call-  Call back number 10/19/2015  Post procedure Call Back phone  # [phone] no answering machine  Permission to leave phone message No     Attempted to call patient for follow up from procedure on 10/19/2015. No answer, and patient does not have voice mail.

## 2015-10-25 ENCOUNTER — Encounter: Payer: Self-pay | Admitting: Internal Medicine

## 2015-11-01 ENCOUNTER — Inpatient Hospital Stay (HOSPITAL_COMMUNITY): Payer: Medicare HMO

## 2015-11-01 ENCOUNTER — Encounter (HOSPITAL_COMMUNITY): Payer: Self-pay | Admitting: *Deleted

## 2015-11-01 ENCOUNTER — Inpatient Hospital Stay (HOSPITAL_COMMUNITY)
Admission: AD | Admit: 2015-11-01 | Discharge: 2015-11-04 | DRG: 812 | Disposition: A | Discharge status: Home / Self Care | Payer: Medicare HMO | Source: Ambulatory Visit | Attending: Cardiovascular Disease | Admitting: Cardiovascular Disease

## 2015-11-01 DIAGNOSIS — Z79899 Other long term (current) drug therapy: Secondary | ICD-10-CM | POA: Diagnosis not present

## 2015-11-01 DIAGNOSIS — Z8249 Family history of ischemic heart disease and other diseases of the circulatory system: Secondary | ICD-10-CM

## 2015-11-01 DIAGNOSIS — K222 Esophageal obstruction: Secondary | ICD-10-CM | POA: Diagnosis present

## 2015-11-01 DIAGNOSIS — Z90710 Acquired absence of both cervix and uterus: Secondary | ICD-10-CM

## 2015-11-01 DIAGNOSIS — K219 Gastro-esophageal reflux disease without esophagitis: Secondary | ICD-10-CM | POA: Diagnosis present

## 2015-11-01 DIAGNOSIS — Z23 Encounter for immunization: Secondary | ICD-10-CM

## 2015-11-01 DIAGNOSIS — I10 Essential (primary) hypertension: Secondary | ICD-10-CM | POA: Diagnosis present

## 2015-11-01 DIAGNOSIS — Z9049 Acquired absence of other specified parts of digestive tract: Secondary | ICD-10-CM

## 2015-11-01 DIAGNOSIS — K259 Gastric ulcer, unspecified as acute or chronic, without hemorrhage or perforation: Secondary | ICD-10-CM | POA: Diagnosis present

## 2015-11-01 DIAGNOSIS — Z882 Allergy status to sulfonamides status: Secondary | ICD-10-CM

## 2015-11-01 DIAGNOSIS — R001 Bradycardia, unspecified: Secondary | ICD-10-CM | POA: Diagnosis present

## 2015-11-01 DIAGNOSIS — F17210 Nicotine dependence, cigarettes, uncomplicated: Secondary | ICD-10-CM | POA: Diagnosis present

## 2015-11-01 DIAGNOSIS — J811 Chronic pulmonary edema: Secondary | ICD-10-CM | POA: Diagnosis present

## 2015-11-01 DIAGNOSIS — K2980 Duodenitis without bleeding: Secondary | ICD-10-CM | POA: Diagnosis present

## 2015-11-01 DIAGNOSIS — I252 Old myocardial infarction: Secondary | ICD-10-CM

## 2015-11-01 DIAGNOSIS — E785 Hyperlipidemia, unspecified: Secondary | ICD-10-CM | POA: Diagnosis present

## 2015-11-01 DIAGNOSIS — Z955 Presence of coronary angioplasty implant and graft: Secondary | ICD-10-CM

## 2015-11-01 DIAGNOSIS — D649 Anemia, unspecified: Secondary | ICD-10-CM | POA: Diagnosis present

## 2015-11-01 DIAGNOSIS — R079 Chest pain, unspecified: Secondary | ICD-10-CM | POA: Diagnosis present

## 2015-11-01 DIAGNOSIS — Z7982 Long term (current) use of aspirin: Secondary | ICD-10-CM

## 2015-11-01 DIAGNOSIS — I071 Rheumatic tricuspid insufficiency: Secondary | ICD-10-CM | POA: Diagnosis present

## 2015-11-01 DIAGNOSIS — I340 Nonrheumatic mitral (valve) insufficiency: Secondary | ICD-10-CM | POA: Diagnosis present

## 2015-11-01 DIAGNOSIS — I2510 Atherosclerotic heart disease of native coronary artery without angina pectoris: Secondary | ICD-10-CM | POA: Diagnosis present

## 2015-11-01 DIAGNOSIS — Z9889 Other specified postprocedural states: Secondary | ICD-10-CM

## 2015-11-01 DIAGNOSIS — R0602 Shortness of breath: Secondary | ICD-10-CM | POA: Diagnosis present

## 2015-11-01 DIAGNOSIS — K449 Diaphragmatic hernia without obstruction or gangrene: Secondary | ICD-10-CM | POA: Diagnosis present

## 2015-11-01 DIAGNOSIS — R0609 Other forms of dyspnea: Secondary | ICD-10-CM | POA: Diagnosis present

## 2015-11-01 HISTORY — DX: Atherosclerosis of renal artery: I70.1

## 2015-11-01 LAB — COMPREHENSIVE METABOLIC PANEL
ALK PHOS: 72 U/L (ref 38–126)
ALT: 13 U/L — ABNORMAL LOW (ref 14–54)
ANION GAP: 5 (ref 5–15)
AST: 20 U/L (ref 15–41)
Albumin: 2.5 g/dL — ABNORMAL LOW (ref 3.5–5.0)
BILIRUBIN TOTAL: 0.4 mg/dL (ref 0.3–1.2)
BUN: 13 mg/dL (ref 6–20)
CALCIUM: 8.7 mg/dL — AB (ref 8.9–10.3)
CO2: 19 mmol/L — ABNORMAL LOW (ref 22–32)
Chloride: 111 mmol/L (ref 101–111)
Creatinine, Ser: 1.16 mg/dL — ABNORMAL HIGH (ref 0.44–1.00)
GFR calc non Af Amer: 47 mL/min — ABNORMAL LOW (ref >60)
GFR, EST AFRICAN AMERICAN: 55 mL/min — AB (ref >60)
Glucose, Bld: 100 mg/dL — ABNORMAL HIGH (ref 65–99)
Potassium: 3.8 mmol/L (ref 3.5–5.1)
Sodium: 135 mmol/L (ref 135–145)

## 2015-11-01 LAB — CBC WITH DIFFERENTIAL/PLATELET
BASOS ABS: 0.1 10*3/uL (ref 0.0–0.1)
Basophils Relative: 1 %
EOS ABS: 0.1 10*3/uL (ref 0.0–0.7)
Eosinophils Relative: 2 %
HCT: 21.7 % — ABNORMAL LOW (ref 36.0–46.0)
HEMOGLOBIN: 6.7 g/dL — AB (ref 12.0–15.0)
LYMPHS ABS: 2.2 10*3/uL (ref 0.7–4.0)
Lymphocytes Relative: 33 %
MCH: 27.1 pg (ref 26.0–34.0)
MCHC: 30.9 g/dL (ref 30.0–36.0)
MCV: 87.9 fL (ref 78.0–100.0)
Monocytes Absolute: 0.7 10*3/uL (ref 0.1–1.0)
Monocytes Relative: 10 %
NEUTROS ABS: 3.5 10*3/uL (ref 1.7–7.7)
Neutrophils Relative %: 54 %
PLATELETS: 160 10*3/uL (ref 150–400)
RBC: 2.47 MIL/uL — ABNORMAL LOW (ref 3.87–5.11)
RDW: 22.6 % — ABNORMAL HIGH (ref 11.5–15.5)
WBC: 6.6 10*3/uL (ref 4.0–10.5)

## 2015-11-01 LAB — TSH: TSH: 0.467 u[IU]/mL (ref 0.350–4.500)

## 2015-11-01 LAB — BRAIN NATRIURETIC PEPTIDE: B NATRIURETIC PEPTIDE 5: 270.5 pg/mL — AB (ref 0.0–100.0)

## 2015-11-01 LAB — TROPONIN I

## 2015-11-01 MED ORDER — LOSARTAN POTASSIUM 50 MG PO TABS
100.0000 mg | ORAL_TABLET | Freq: Every day | ORAL | Status: DC
  Administered 2015-11-02 – 2015-11-04 (×3): 100 mg via ORAL
  Filled 2015-11-01 (×3): qty 2

## 2015-11-01 MED ORDER — FUROSEMIDE 10 MG/ML IJ SOLN: 40.0000 mg | Freq: Once | INTRAMUSCULAR | Status: DC

## 2015-11-01 MED ORDER — AMLODIPINE BESYLATE 5 MG PO TABS
5.0000 mg | ORAL_TABLET | Freq: Every day | ORAL | Status: DC
  Administered 2015-11-02 – 2015-11-04 (×3): 5 mg via ORAL
  Filled 2015-11-01 (×3): qty 1

## 2015-11-01 MED ORDER — FUROSEMIDE 10 MG/ML IJ SOLN
40.0000 mg | Freq: Once | INTRAMUSCULAR | Status: AC
  Administered 2015-11-02: 40 mg via INTRAVENOUS
  Filled 2015-11-01: qty 4

## 2015-11-01 MED ORDER — ASPIRIN EC 81 MG PO TBEC
81.0000 mg | DELAYED_RELEASE_TABLET | Freq: Every day | ORAL | Status: DC
  Administered 2015-11-02 – 2015-11-04 (×3): 81 mg via ORAL
  Filled 2015-11-01 (×3): qty 1

## 2015-11-01 MED ORDER — FLUOXETINE HCL 10 MG PO CAPS: 10.0000 mg | ORAL_CAPSULE | Freq: Every day | ORAL | Status: DC

## 2015-11-01 MED ORDER — POTASSIUM CHLORIDE ER 10 MEQ PO TBCR
10.0000 meq | EXTENDED_RELEASE_TABLET | Freq: Every day | ORAL | Status: DC
  Administered 2015-11-02: 10 meq via ORAL
  Filled 2015-11-01 (×2): qty 1

## 2015-11-01 MED ORDER — NITROGLYCERIN 0.4 MG SL SUBL: 0.4000 mg | SUBLINGUAL_TABLET | SUBLINGUAL | Status: DC | PRN

## 2015-11-01 MED ORDER — ONDANSETRON HCL 4 MG/2ML IJ SOLN: 4.0000 mg | Freq: Four times a day (QID) | INTRAMUSCULAR | Status: DC | PRN

## 2015-11-01 MED ORDER — HEPARIN (PORCINE) IN NACL 100-0.45 UNIT/ML-% IJ SOLN
950.0000 [IU]/h | INTRAMUSCULAR | Status: DC
  Administered 2015-11-01: 950 [IU]/h via INTRAVENOUS
  Filled 2015-11-01: qty 250

## 2015-11-01 MED ORDER — PRAVASTATIN SODIUM 40 MG PO TABS
40.0000 mg | ORAL_TABLET | Freq: Every evening | ORAL | Status: DC
  Administered 2015-11-01 – 2015-11-03 (×3): 40 mg via ORAL
  Filled 2015-11-01 (×3): qty 1

## 2015-11-01 MED ORDER — VITAMIN D 1000 UNITS PO TABS
1000.0000 [IU] | ORAL_TABLET | Freq: Every day | ORAL | Status: DC
  Administered 2015-11-02 – 2015-11-04 (×3): 1000 [IU] via ORAL
  Filled 2015-11-01 (×3): qty 1

## 2015-11-01 MED ORDER — SODIUM CHLORIDE 0.9 % IV SOLN: 250.0000 mL | INTRAVENOUS | Status: DC | PRN

## 2015-11-01 MED ORDER — SODIUM CHLORIDE 0.9 % IV SOLN: Freq: Once | INTRAVENOUS | Status: DC

## 2015-11-01 MED ORDER — PANTOPRAZOLE SODIUM 40 MG PO TBEC: 40.0000 mg | DELAYED_RELEASE_TABLET | Freq: Every day | ORAL | Status: DC | PRN

## 2015-11-01 MED ORDER — SODIUM CHLORIDE 0.9 % IJ SOLN: 3.0000 mL | INTRAMUSCULAR | Status: DC | PRN

## 2015-11-01 MED ORDER — ACETAMINOPHEN 325 MG PO TABS
650.0000 mg | ORAL_TABLET | ORAL | Status: DC | PRN
  Administered 2015-11-02 – 2015-11-04 (×3): 650 mg via ORAL
  Filled 2015-11-01 (×3): qty 2

## 2015-11-01 MED ORDER — ASPIRIN 81 MG PO CHEW
324.0000 mg | CHEWABLE_TABLET | ORAL | Status: AC
  Administered 2015-11-01: 324 mg via ORAL
  Filled 2015-11-01: qty 4

## 2015-11-01 MED ORDER — ISOSORBIDE MONONITRATE ER 60 MG PO TB24
60.0000 mg | ORAL_TABLET | Freq: Two times a day (BID) | ORAL | Status: DC
  Administered 2015-11-02 – 2015-11-04 (×5): 60 mg via ORAL
  Filled 2015-11-01 (×6): qty 1

## 2015-11-01 MED ORDER — PNEUMOCOCCAL VAC POLYVALENT 25 MCG/0.5ML IJ INJ
0.5000 mL | INJECTION | INTRAMUSCULAR | Status: AC
  Administered 2015-11-02: 0.5 mL via INTRAMUSCULAR
  Filled 2015-11-01: qty 0.5

## 2015-11-01 MED ORDER — ASPIRIN 300 MG RE SUPP
300.0000 mg | RECTAL | Status: AC
  Filled 2015-11-01: qty 1

## 2015-11-01 MED ORDER — HYDROCHLOROTHIAZIDE 25 MG PO TABS
25.0000 mg | ORAL_TABLET | Freq: Every day | ORAL | Status: DC
  Administered 2015-11-02 – 2015-11-04 (×3): 25 mg via ORAL
  Filled 2015-11-01 (×3): qty 1

## 2015-11-01 MED ORDER — SODIUM CHLORIDE 0.9 % IJ SOLN
3.0000 mL | Freq: Two times a day (BID) | INTRAMUSCULAR | Status: DC
  Administered 2015-11-01 – 2015-11-04 (×6): 3 mL via INTRAVENOUS

## 2015-11-01 MED ORDER — FLUOXETINE HCL 10 MG PO CAPS
10.0000 mg | ORAL_CAPSULE | Freq: Every day | ORAL | Status: DC
  Administered 2015-11-01 – 2015-11-04 (×4): 10 mg via ORAL
  Filled 2015-11-01 (×4): qty 1

## 2015-11-01 MED ORDER — HEPARIN BOLUS VIA INFUSION
4000.0000 [IU] | Freq: Once | INTRAVENOUS | Status: AC
  Administered 2015-11-01: 4000 [IU] via INTRAVENOUS
  Filled 2015-11-01: qty 4000

## 2015-11-01 NOTE — Progress Notes (Addendum)
ANTICOAGULATION CONSULT NOTE - Initial Consult  Pharmacy Consult for Heparin IV Indication: chest pain/ACS  Allergies  Allergen Reactions  . Sulfa Antibiotics Rash    Patient Measurements: Height: 5\' 5"  (165.1 cm) Weight: 199 lb 11.2 oz (90.583 kg) IBW/kg (Calculated) : 57 Heparin Dosing Weight: 77 kg  Vital Signs: Temp: 98.5 F (36.9 C) (01/12 1640) Pulse Rate: 60 (01/12 1640)  Labs: No results for input(s): HGB, HCT, PLT, APTT, LABPROT, INR, HEPARINUNFRC, CREATININE, CKTOTAL, CKMB, TROPONINI in the last 72 hours.  CrCl cannot be calculated (Patient has no serum creatinine result on file.).   Medical History: Past Medical History  Diagnosis Date  . Hypertension   . Retinal hemorrhage, right eye   . Coronary artery disease   . Hyperlipidemia   . Depression   . GERD (gastroesophageal reflux disease)   . Anemia   . Cataract     extraction 2014  . Heart murmur   . MI (myocardial infarction) ([facility])     1999   Assessment: 70 year old female admitted with ACS to start IV heparin. Patient was not on anticoagulation prior to admission. Baseline labs are pending.   Goal of Therapy:  Heparin level 0.3-0.7 units/ml Monitor platelets by anticoagulation protocol: Yes   Plan:  Give 4000 units bolus x 1 Start heparin infusion at 950 units/hr Check anti-Xa level in 6 hours and daily while on heparin Continue to monitor H&H and platelets  Sloan Leiter, PharmD, BCPS Clinical Pharmacist [phone]  11/01/2015,5:58 PM  Hgb = 6.7 on initial labs >> RN called Dr. Doylene Canard. Heparin d/c'd.   Sloan Leiter, PharmD, BCPS Clinical Pharmacist [phone] 11/01/2015, 7:52 PM

## 2015-11-01 NOTE — Progress Notes (Signed)
Lab called with Hgb 6.7.  Dr Doylene Canard made aware. Order received to d/c iv heparin and await further orders

## 2015-11-01 NOTE — H&P (Addendum)
Referring Physician: [name], MD.  Abigail [name] is an 70 y.o. female.                       Chief Complaint: Shortness of breath on exertion  HPI: 70 year old female with past medical history significant for coronary artery disease, history of MI in the past, status post PTCA stenting in 1999, history of bilateral renal artery stenosis treated with stenting, tobacco abuse and positive family history of coronary artery disease has shortness of breath on exertion x 1 month. Echocardiogram done in office today showed Mild MR and TR with dilated LA. No fever, chills or cough.  Past Medical History  Diagnosis Date  . Hypertension   . Retinal hemorrhage, right eye   . Coronary artery disease   . Hyperlipidemia   . Depression   . GERD (gastroesophageal reflux disease)   . Anemia   . Cataract     extraction 2014  . Heart murmur   . MI (myocardial infarction) ([place])     1999      Past Surgical History  Procedure Laterality Date  . Cardiac stent    . Back surgery    . Appendectomy    . Abdominal hysterectomy      Family History  Problem Relation Age of Onset  . Colon cancer Neg Hx    Social History:  reports that she has been smoking Cigarettes.  She has been smoking about 0.50 packs per day. She has never used smokeless tobacco. She reports that she does not drink alcohol or use illicit drugs.  Allergies:  Allergies  Allergen Reactions  . Sulfa Antibiotics Rash    Medications Prior to Admission  Medication Sig Dispense Refill  . amLODipine (NORVASC) 5 MG tablet Take 5 mg by mouth daily.  1  . aspirin 81 MG tablet Take 81 mg by mouth daily.    . Cholecalciferol (VITAMIN D PO) Take 1 capsule by mouth daily.    Marland Kitchen FLUoxetine (PROZAC) 10 MG capsule Take 10 mg by mouth daily.    . hydrochlorothiazide (HYDRODIURIL) 25 MG tablet Take 25 mg by mouth daily.  1  . IRON PO Take 1 tablet by mouth every evening.    . isosorbide mononitrate (IMDUR) 60 MG 24 hr tablet Take 60 mg by mouth  2 (two) times daily.     Marland Kitchen KLOR-CON 10 10 MEQ tablet Take 10 mEq by mouth daily.  6  . losartan (COZAAR) 100 MG tablet Take 100 mg by mouth daily.  1  . Multiple Vitamins-Minerals (MULTIVITAMIN ADULT PO) Take by mouth. Reported on 10/19/2015    . pantoprazole (PROTONIX) 40 MG tablet Take 40 mg by mouth daily as needed (acid reflux). Reported on 10/19/2015    . pravastatin (PRAVACHOL) 40 MG tablet Take 40 mg by mouth every evening.  1    No results found for this or any previous visit (from the past 48 hour(s)). No results found.  Review Of Systems Constitutional: Negative for fever, chills and weight loss.  HENT: Negative for hearing loss, neck pain and tinnitus.  Eyes: Negative for blurred vision, double vision and photophobia.  Respiratory: Negative for cough, hemoptysis and sputum production.  Cardiovascular: Positive for chest pain. Negative for palpitations, orthopnea, claudication and leg swelling.  Gastrointestinal: Positive for nausea and vomiting. Negative for heartburn and abdominal pain.  Musculoskeletal: Negative for back pain.  Skin: Negative for itching and rash.  Neurological: Negative for dizziness and headaches.  Pulse 60, temperature 98.5 F (36.9 C), resp. rate 16, height 5\' 5"  (1.651 m), weight 90.583 kg (199 lb 11.2 oz), SpO2 100 %.  Physical Exam  Constitutional: She appears well-developed and well nourished.  HENT: Head: Normocephalic, atraumatic. Brown eyes, Conjunctivae are pale. No scleral icterus.  Neck: Normal range of motion. Neck supple. No JVD present. No tracheal deviation present. No thyromegaly present.  Cardiovascular: Normal rate and regular rhythm. Exam reveals no friction rub.S1-S2 was normal   Respiratory: Effort normal and breath sounds normal. No respiratory distress. Chest wall non-tender.  GI: Soft. Bowel sounds are normal. She exhibits no distension. There is no tenderness.   Musculoskeletal: She exhibits no edema and no tenderness.   Lymphadenopathy: She has no cervical adenopathy.  Neurological: She is alert and oriented to person, place, and time. Moves all 4 extremities. Skin: Warm, dry and pale.  Assessment/Plan Symptomatic anemia Shortness of breath CAD S/P stent Hypertension H/O bilateral renal artery stenosis Dyslipidemia Tobacco use disorder Mitral regurgitation Tricuspid regurgitation  Admit. R/O MI/CHF/Blood transfusion/GI consult in AM   [facility] S, MD  11/01/2015, 5:41 PM

## 2015-11-02 ENCOUNTER — Encounter (HOSPITAL_COMMUNITY)
Admission: AD | Disposition: A | Discharge status: Home / Self Care | Payer: Self-pay | Source: Ambulatory Visit | Attending: Cardiovascular Disease

## 2015-11-02 ENCOUNTER — Encounter (HOSPITAL_COMMUNITY): Payer: Self-pay | Admitting: Physician Assistant

## 2015-11-02 ENCOUNTER — Other Ambulatory Visit: Payer: Self-pay

## 2015-11-02 DIAGNOSIS — D649 Anemia, unspecified: Secondary | ICD-10-CM | POA: Diagnosis not present

## 2015-11-02 LAB — CBC
HEMATOCRIT: 28.4 % — AB (ref 36.0–46.0)
Hemoglobin: 9.2 g/dL — ABNORMAL LOW (ref 12.0–15.0)
MCH: 28.2 pg (ref 26.0–34.0)
MCHC: 32.4 g/dL (ref 30.0–36.0)
MCV: 87.1 fL (ref 78.0–100.0)
PLATELETS: 134 10*3/uL — AB (ref 150–400)
RBC: 3.26 MIL/uL — ABNORMAL LOW (ref 3.87–5.11)
RDW: 20 % — AB (ref 11.5–15.5)
WBC: 8.8 10*3/uL (ref 4.0–10.5)

## 2015-11-02 LAB — PROTIME-INR
INR: 1.26 (ref 0.00–1.49)
PROTHROMBIN TIME: 15.9 s — AB (ref 11.6–15.2)

## 2015-11-02 LAB — BASIC METABOLIC PANEL
ANION GAP: 3 — AB (ref 5–15)
BUN: 10 mg/dL (ref 6–20)
CALCIUM: 8.7 mg/dL — AB (ref 8.9–10.3)
CO2: 21 mmol/L — ABNORMAL LOW (ref 22–32)
Chloride: 110 mmol/L (ref 101–111)
Creatinine, Ser: 0.92 mg/dL (ref 0.44–1.00)
GLUCOSE: 93 mg/dL (ref 65–99)
POTASSIUM: 3.7 mmol/L (ref 3.5–5.1)
Sodium: 134 mmol/L — ABNORMAL LOW (ref 135–145)

## 2015-11-02 LAB — IRON AND TIBC
IRON: 43 ug/dL (ref 28–170)
Saturation Ratios: 21 % (ref 10.4–31.8)
TIBC: 202 ug/dL — AB (ref 250–450)
UIBC: 159 ug/dL

## 2015-11-02 LAB — LIPID PANEL
CHOL/HDL RATIO: 2 ratio
Cholesterol: 86 mg/dL (ref 0–200)
HDL: 44 mg/dL (ref >40)
LDL Cholesterol: 28 mg/dL (ref 0–99)
TRIGLYCERIDES: 68 mg/dL (ref <150)
VLDL: 14 mg/dL (ref 0–40)

## 2015-11-02 LAB — FERRITIN: Ferritin: 141 ng/mL (ref 11–307)

## 2015-11-02 LAB — TROPONIN I: TROPONIN I: 0.03 ng/mL (ref <0.031)

## 2015-11-02 LAB — ABO/RH: ABO/RH(D): O POS

## 2015-11-02 LAB — PREPARE RBC (CROSSMATCH)

## 2015-11-02 SURGERY — LEFT HEART CATH AND CORONARY ANGIOGRAPHY

## 2015-11-02 MED ORDER — POTASSIUM CHLORIDE ER 10 MEQ PO TBCR
10.0000 meq | EXTENDED_RELEASE_TABLET | Freq: Two times a day (BID) | ORAL | Status: DC
  Administered 2015-11-02 – 2015-11-04 (×4): 10 meq via ORAL
  Filled 2015-11-02 (×8): qty 1

## 2015-11-02 NOTE — Progress Notes (Signed)
Ref: Philis Fendt, MD   Subjective:  Feeling better. Hgb improved to 9.2  Objective:  Vital Signs in the last 24 hours: Temp:  [98.5 F (36.9 C)-99.2 F (37.3 C)] 98.6 F (37 C) (01/13 1200) Pulse Rate:  [55-96] 55 (01/13 1200) Cardiac Rhythm:  [-] Sinus bradycardia (01/13 0700) Resp:  [18-20] 18 (01/13 1200) BP: (122-165)/(50-72) 160/70 mmHg (01/13 1200) SpO2:  [95 %-99 %] 96 % (01/13 1200) Weight:  [89.812 kg (198 lb)-90.2 kg (198 lb 13.7 oz)] 89.812 kg (198 lb) (01/13 1032)  Physical Exam: BP Readings from Last 1 Encounters:  11/02/15 160/70    Wt Readings from Last 1 Encounters:  11/02/15 89.812 kg (198 lb)    Weight change:   HEENT: [place]/AT, Eyes-Brown, PERL, EOMI, Conjunctiva-Pale pink, Sclera-Non-icteric Neck: No JVD, No bruit, Trachea midline. Lungs:  Clear, Bilateral. Cardiac:  Regular rhythm, normal S1 and S2, no S3. II/VI systolic murmur. Abdomen:  Soft, non-tender. Extremities:  No edema present. No cyanosis. No clubbing. CNS: AxOx3, Cranial nerves grossly intact, moves all 4 extremities. Right handed. Skin: Warm and dry.   Intake/Output from previous day: 01/12 0701 - 01/13 0700 In: 705 [P.O.:340; Blood:365] Out: 1150 [Urine:1150]    Lab Results: BMET    Component Value Date/Time   NA 134* 11/02/2015 0801   NA 135 11/01/2015 1830   NA 134* 07/24/2015 1023   K 3.7 11/02/2015 0801   K 3.8 11/01/2015 1830   K 4.1 07/24/2015 1023   CL 110 11/02/2015 0801   CL 111 11/01/2015 1830   CL 107 07/24/2015 1023   CO2 21* 11/02/2015 0801   CO2 19* 11/01/2015 1830   CO2 24 07/24/2015 1023   GLUCOSE 93 11/02/2015 0801   GLUCOSE 100* 11/01/2015 1830   GLUCOSE 111* 07/24/2015 1023   BUN 10 11/02/2015 0801   BUN 13 11/01/2015 1830   BUN 9 07/24/2015 1023   CREATININE 0.92 11/02/2015 0801   CREATININE 1.16* 11/01/2015 1830   CREATININE 1.00 07/24/2015 1023   CALCIUM 8.7* 11/02/2015 0801   CALCIUM 8.7* 11/01/2015 1830   CALCIUM 9.3 07/24/2015 1023   GFRNONAA >60 11/02/2015 0801   GFRNONAA 47* 11/01/2015 1830   GFRNONAA 57* 07/24/2015 1023   GFRAA >60 11/02/2015 0801   GFRAA 55* 11/01/2015 1830   GFRAA >60 07/24/2015 1023   CBC    Component Value Date/Time   WBC 8.8 11/02/2015 1220   RBC 3.26* 11/02/2015 1220   HGB 9.2* 11/02/2015 1220   HCT 28.4* 11/02/2015 1220   PLT 134* 11/02/2015 1220   MCV 87.1 11/02/2015 1220   MCH 28.2 11/02/2015 1220   MCHC 32.4 11/02/2015 1220   RDW 20.0* 11/02/2015 1220   LYMPHSABS 2.2 11/01/2015 1830   MONOABS 0.7 11/01/2015 1830   EOSABS 0.1 11/01/2015 1830   BASOSABS 0.1 11/01/2015 1830   HEPATIC Function Panel  Recent Labs  11/01/15 1830  PROT >12.0*   HEMOGLOBIN A1C No components found for: HGA1C,  MPG CARDIAC ENZYMES Lab Results  Component Value Date   CKTOTAL 129 01/01/2012   CKMB 4.6* 01/01/2012   TROPONINI 0.03 11/01/2015   TROPONINI <0.03 11/01/2015   TROPONINI 0.38* 01/01/2012   BNP No results for input(s): PROBNP in the last 8760 hours. TSH  Recent Labs  11/01/15 1830  TSH 0.467   CHOLESTEROL  Recent Labs  11/02/15 0630  CHOL 86    Scheduled Meds: . sodium chloride   Intravenous Once  . amLODipine  5 mg Oral Daily  . aspirin  EC  81 mg Oral Daily  . cholecalciferol  1,000 Units Oral Daily  . FLUoxetine  10 mg Oral Daily  . hydrochlorothiazide  25 mg Oral Daily  . isosorbide mononitrate  60 mg Oral BID  . losartan  100 mg Oral Daily  . potassium chloride  10 mEq Oral Daily  . pravastatin  40 mg Oral QPM  . sodium chloride  3 mL Intravenous Q12H   Continuous Infusions:  PRN Meds:.sodium chloride, acetaminophen, nitroGLYCERIN, ondansetron (ZOFRAN) IV, sodium chloride  Assessment/Plan: Symptomatic anemia Shortness of breath CAD S/P stent Hypertension H/O bilateral renal artery stenosis Dyslipidemia Tobacco use disorder Mitral regurgitation Tricuspid regurgitation  Appreciate GI consult. Possible EGD in AM. CBC in AM   LOS: 1 day    [name]  MD  11/02/2015, 6:34 PM

## 2015-11-02 NOTE — Progress Notes (Signed)
Received call from lab regarding pt's troponin that couldnot be done for some technical reasons, will follow up with lab again Pt is scheduled for EGD tomorrow, pt is aware about NPO since midnight, will continue to monitor.

## 2015-11-02 NOTE — Progress Notes (Addendum)
Initial Nutrition Assessment  DOCUMENTATION CODES:   Non-severe (moderate) malnutrition in context of chronic illness, Obesity unspecified  INTERVENTION:   Add Ensure Enlive BID once diet advances  NUTRITION DIAGNOSIS:   Malnutrition related to chronic illness as evidenced by 6 percent weight loss x1 month, energy intake < 75% for > or equal to 1 month.  GOAL:   Patient will meet greater than or equal to 90% of their needs   MONITOR:   PO intake, Diet advancement, Labs  REASON FOR ASSESSMENT:   Malnutrition Screening Tool    ASSESSMENT:   Patient with history of fatigue starting in October 2016 which has been progressive.   Patient reported decreased appetite and reported a 12 lbs weight loss over the past month, 6% weight loss.  Patient reported a usual body weight between 210-215 lbs.   Patient reported that over the past month she ate 1-2 sandwiches a day. She reports she eats around 10 am and if she eats again its around 6 pm. The patient reported she is too tired to eat. She only drinks water. Per dietary recall the patient does consume iron rich foods.   Patient lives alone but is very active at goes to the [facility] 5 times a week.   Nutrition Focus Physical Exam completed. Findings are no fat depletion, no muscle depletion, and no edema present.   Medications: Vitamin D, K-DUR Diet Order:  Diet NPO time specified Except for: Ice Chips  Skin:  Reviewed, no issues  Last BM:  11/01/2015   Height:   Ht Readings from Last 1 Encounters:  11/02/15 5\' 5"  (1.651 m)    Weight:   Wt Readings from Last 1 Encounters:  11/02/15 198 lb (89.812 kg)    Ideal Body Weight:  125 kg  BMI:  Body mass index is 32.95 kg/(m^2).  Estimated Nutritional Needs:   Kcal:  1,600-1,800  Protein:  85-95g   Fluid:  1.6-1.8 L  EDUCATION NEEDS:   Education needs no appropriate at this time  [place], Dietetic Intern

## 2015-11-02 NOTE — Progress Notes (Addendum)
1 unit BT done this morning, no any side effects noted, vitals stable, will continue to monitor

## 2015-11-02 NOTE — Consult Note (Signed)
[facility] Consult: 11:15 AM 11/02/2015  LOS: 1 day    Referring Provider: Dr Doylene Canard  Primary Care Physician:  Philis Fendt, MD Primary Gastroenterologist:  Dr. Hilarie Fredrickson    Reason for Consultation:  Anemia   HPI: Abigail [name] is a 70 y.o. non infirm, generally high-functioning, AA female.  PMH hypertension, CAD, MI in 1999. Cardiac stenting 1999. S/P renal artery stenting for renal artery stenosis 1999.  Right retinal hemorrhage. Anemia.  S/P spinal surgery, appendectomy and abdominal hysterectomy.  Daily meds include 81 mg aspirin. She is prescribed Protonix 40 mg PRN.   Status post direct, outpatient screening colonoscopy 10/19/2015.  5 mm polyps 2 removed from  transverse and descending colon. (Pathology tubular adenoma no HGD).  Moderate left colon tics. Prior to the colonoscopy, the patient nor referring physician provided history of anemia though her hemoglobin was 8.2 on 07/24/2015. Abdominal/pelvic CT with contrast 07/2015 showed normal liver, GB, spleen, pancreas  Starting in late October, November she started noticing fatigue with activity. This is progressed.  She actually denies dyspnea on exertion just says that she gets quite fatigued and has to rest before the fatigue passes. She has had some exertional tachycardia but no sense of irregular heartbeat. No exertional chest pain. She was started on once daily oral iron in November. She has not noticed dark stool she is not passing blood per rectum, no melena. No GI upset, no dysphagia, no anorexia, no weight loss. She has never undergone upper endoscopy.  Although she bruises easily, she is not experienced large hematomas. She does not have nasal or oral bleeding and does not feel that she bleeds excessively if she worsens herself. She recalls having  been prescribed iron tablets as a young woman but has never required blood transfusions. Infrequent use of Aleve, takes 2 tablets maybe 2-3 times monthly. No alcohol.  Due to increasing exertional fatigue, Dr Doylene Canard admitted her for observation.  Hemoglobin 6.7, MCV 87. WBCs and platelets normal. PT/INR normal.  Slight increase in creatinine to 1.1 but normal BUN at 13. Iron, iron saturation normal. Total iron binding capacity is reduced. Ferritin is 141. FOBT testing not yet performed. CXR shows stable, mild cardiomegaly and borderline mild pulmonary edema.       Past Medical History  Diagnosis Date  . Hypertension   . Retinal hemorrhage, right eye   . Coronary artery disease   . Hyperlipidemia   . Depression   . GERD (gastroesophageal reflux disease)   . Anemia   . Cataract     extraction 2014  . Heart murmur   . MI (myocardial infarction) ([place])     1999    Past Surgical History  Procedure Laterality Date  . Cardiac stent    . Back surgery    . Appendectomy    . Abdominal hysterectomy      Prior to Admission medications   Medication Sig Start Date End Date Taking? Authorizing Provider  amLODipine (NORVASC) 5 MG tablet Take 10 mg by mouth daily.  07/13/15  Yes Historical Provider, MD  aspirin 81 MG tablet Take 81 mg by mouth daily.   Yes Historical Provider, MD  Cholecalciferol (VITAMIN D PO) Take 1 capsule by mouth daily.   Yes Historical Provider, MD  FLUoxetine (PROZAC) 10 MG capsule Take 10 mg by mouth daily.   Yes Historical Provider, MD  hydrochlorothiazide (HYDRODIURIL) 25 MG tablet Take 25 mg by mouth daily. 07/13/15  Yes Historical Provider, MD  IRON PO Take 1 tablet by mouth every evening.   Yes Historical Provider, MD  isosorbide mononitrate (IMDUR) 60 MG 24 hr tablet Take 60 mg by mouth 2 (two) times daily.    Yes Historical Provider, MD  KLOR-CON 10 10 MEQ tablet Take 10 mEq by mouth daily. 05/15/15  Yes Historical Provider, MD  losartan (COZAAR) 100 MG tablet  Take 100 mg by mouth daily. 07/13/15  Yes Historical Provider, MD  Multiple Vitamins-Minerals (MULTIVITAMIN ADULT PO) Take by mouth. Reported on 10/19/2015   Yes Historical Provider, MD  pantoprazole (PROTONIX) 40 MG tablet Take 40 mg by mouth daily as needed (acid reflux). Reported on 10/19/2015 07/22/15  Yes Historical Provider, MD  pravastatin (PRAVACHOL) 40 MG tablet Take 40 mg by mouth every evening. 06/03/15  Yes Historical Provider, MD    Scheduled Meds: . sodium chloride   Intravenous Once  . amLODipine  5 mg Oral Daily  . aspirin EC  81 mg Oral Daily  . cholecalciferol  1,000 Units Oral Daily  . FLUoxetine  10 mg Oral Daily  . hydrochlorothiazide  25 mg Oral Daily  . isosorbide mononitrate  60 mg Oral BID  . losartan  100 mg Oral Daily  . potassium chloride  10 mEq Oral Daily  . pravastatin  40 mg Oral QPM  . sodium chloride  3 mL Intravenous Q12H   Infusions:   PRN Meds: sodium chloride, acetaminophen, nitroGLYCERIN, ondansetron (ZOFRAN) IV, sodium chloride   Allergies as of 11/01/2015 - Review Complete 11/01/2015  Allergen Reaction Noted  . Sulfa antibiotics Rash 12/30/2011    Family History  Problem Relation Age of Onset  . Colon cancer Neg Hx    positive for coronary artery disease.  Negative for sickle cell disease or sickle cell trait or anemias of any sort.  Social History   Social History  . Marital Status: Single    Spouse Name: N/A  . Number of Children: N/A  . Years of Education: N/A   Occupational History  . Not on file.   Social History Main Topics  . Smoking status: Current Every Day Smoker -- 0.50 packs/day    Types: Cigarettes  . Smokeless tobacco: Never Used  . Alcohol Use: No  . Drug Use: No  . Sexual Activity: No   Other Topics Concern  . Not on file   Social History Narrative    REVIEW OF SYSTEMS: Constitutional:  Per HPI.   ENT:  No nose bleeds Pulm:  Per HPI CV:  No edema.  Per HPI GU:  No hematuria, no frequency.  GI:   Per HPI.   Heme:  Per HPI   Transfusions:  Per HPI Neuro:  No headaches, no peripheral tingling or numbness Derm:  No itching, no rash or sores.  Endocrine:  No sweats or chills.  No polyuria or dysuria Immunization:  Did not inquire as to immunizations status. Travel:  None beyond local counties in last few months.    PHYSICAL EXAM: Vital signs in last 24 hours: Filed Vitals:   11/02/15 0833 11/02/15 1048  BP: 165/71  164/71  Pulse: 64 96  Temp: 98.5 F (36.9 C) 98.5 F (36.9 C)  Resp: 18 18   Wt Readings from Last 3 Encounters:  11/02/15 89.812 kg (198 lb)  10/19/15 92.987 kg (205 lb)  10/05/15 92.987 kg (205 lb)   General: Very pleasant, comfortable, well appearing AAF Head:  No facial asymmetry or swelling. No signs of head trauma.  Eyes:  No scleral icterus. No conjunctival pallor. EOMI. Ears:  Not hard of hearing.  Nose:  No discharge or congestion. Mouth:  Mucous membranes are pink, moist and clear. She is edentulous. Wears dentures but they are not in place currently Neck:  No JVD, no masses, no TMG. Lungs:  Lungs are clear to auscultation and percussion bilaterally. No cough. No dyspnea at rest or with speech. Heart: RRR. No MRG. S1/S2 audible. Abdomen:  Soft, not tender, not distended. No organomegaly, bruits, masses, hernias. Bowel sounds active..   Rectal: Digital exam performed. No stool obtained. No masses.   Musc/Skeltl: No joint contracture deformities, swelling. There is a scar on her lower back consistent with spinal surgery. Extremities:  No CCE. Feet are warm and well perfused.  Neurologic:  Patient is alert, oriented 3. Good historian. Moves all 4 limbs, strength grossly normal but not formally tested. No tremors. No gross neurologic deficits. Skin:  No telangiectasia, rashes, sores or suspicious lesions. Tattoos:  None. Nodes:  No cervical or inguinal adenopathy.   Psych:  Pleasant, calm and cooperative.  Intake/Output from previous day: 01/12 0701  - 01/13 0700 In: 705 [P.O.:340; Blood:365] Out: 1150 [Urine:1150] Intake/Output this shift: Total I/O In: 335 [Blood:335] Out: 450 [Urine:450]  LAB RESULTS:  Recent Labs  11/01/15 1830  WBC 6.6  HGB 6.7*  HCT 21.7*  PLT 160   BMET Lab Results  Component Value Date   NA 134* 11/02/2015   NA 135 11/01/2015   NA 134* 07/24/2015   K 3.7 11/02/2015   K 3.8 11/01/2015   K 4.1 07/24/2015   CL 110 11/02/2015   CL 111 11/01/2015   CL 107 07/24/2015   CO2 21* 11/02/2015   CO2 19* 11/01/2015   CO2 24 07/24/2015   GLUCOSE 93 11/02/2015   GLUCOSE 100* 11/01/2015   GLUCOSE 111* 07/24/2015   BUN 10 11/02/2015   BUN 13 11/01/2015   BUN 9 07/24/2015   CREATININE 0.92 11/02/2015   CREATININE 1.16* 11/01/2015   CREATININE 1.00 07/24/2015   CALCIUM 8.7* 11/02/2015   CALCIUM 8.7* 11/01/2015   CALCIUM 9.3 07/24/2015   LFT  Recent Labs  11/01/15 1830  PROT >12.0*  ALBUMIN 2.5*  AST 20  ALT 13*  ALKPHOS 72  BILITOT 0.4   PT/INR Lab Results  Component Value Date   INR 1.26 11/02/2015   INR 1.14 12/31/2011   INR 1.07 12/30/2011   Hepatitis Panel No results for input(s): HEPBSAG, HCVAB, HEPAIGM, HEPBIGM in the last 72 hours. C-Diff No components found for: CDIFF Lipase  No results found for: LIPASE  Drugs of Abuse  No results found for: LABOPIA, COCAINSCRNUR, LABBENZ, AMPHETMU, THCU, LABBARB   RADIOLOGY STUDIES: Dg Chest 2 View  11/01/2015  CLINICAL DATA:  Shortness of breath.  Weakness. EXAM: CHEST  2 VIEW COMPARISON:  07/24/2015 chest radiograph. FINDINGS: Stable cardiomediastinal silhouette with mild cardiomegaly. No pneumothorax. No pleural effusion. Borderline mild pulmonary edema. No acute consolidative airspace disease. IMPRESSION: Stable mild cardiomegaly.  Borderline mild pulmonary edema. Electronically Signed   By: Ilona Sorrel M.D.   On: 11/01/2015  21:06    ENDOSCOPIC STUDIES: Per HPI  IMPRESSION:   *  Normocytic anemia.  Yesterday's Iron studies  not consistent with iron deficiency anemia, however she has been on once daily iron tablets for a couple of months. However, no documentation to confirm that she ever was actually iron deficient and MCV was normal back in October.. 10/19/15 screening colonoscopy revealed 2 polyps, tubular adenomas, moderate left-sided diverticulosis.  *  History MI, coronary artery disease. Previous cardiac stenting 1999.  Previously on Plavix.  *  History renal artery stenosis bilaterally. Status post stenting in 1999.  Labs do not indicate significant chronic kidney disease.   PLAN:     *  Per Dr. Hilarie Fredrickson.  Patient currently nothing by mouth, if there are no plans for endoscopic evaluation today I will advance her to a solid diet.    Azucena Freed  11/02/2015, 11:15 AM Pager: [phone]

## 2015-11-03 ENCOUNTER — Encounter (HOSPITAL_COMMUNITY): Payer: Self-pay | Admitting: *Deleted

## 2015-11-03 ENCOUNTER — Encounter (HOSPITAL_COMMUNITY)
Admission: AD | Disposition: A | Discharge status: Home / Self Care | Payer: Self-pay | Source: Ambulatory Visit | Attending: Cardiovascular Disease

## 2015-11-03 HISTORY — PX: ESOPHAGOGASTRODUODENOSCOPY: SHX5428

## 2015-11-03 LAB — CBC
HCT: 28.9 % — ABNORMAL LOW (ref 36.0–46.0)
Hemoglobin: 9.5 g/dL — ABNORMAL LOW (ref 12.0–15.0)
MCH: 28.4 pg (ref 26.0–34.0)
MCHC: 32.9 g/dL (ref 30.0–36.0)
MCV: 86.5 fL (ref 78.0–100.0)
PLATELETS: 149 10*3/uL — AB (ref 150–400)
RBC: 3.34 MIL/uL — AB (ref 3.87–5.11)
RDW: 20.3 % — AB (ref 11.5–15.5)
WBC: 6.6 10*3/uL (ref 4.0–10.5)

## 2015-11-03 LAB — TYPE AND SCREEN
ABO/RH(D): O POS
Antibody Screen: NEGATIVE
Unit division: 0
Unit division: 0

## 2015-11-03 LAB — GLUCOSE, CAPILLARY: Glucose-Capillary: 74 mg/dL (ref 65–99)

## 2015-11-03 SURGERY — EGD (ESOPHAGOGASTRODUODENOSCOPY)
Anesthesia: Moderate Sedation

## 2015-11-03 MED ORDER — MIDAZOLAM HCL 10 MG/2ML IJ SOLN
INTRAMUSCULAR | Status: DC | PRN
  Administered 2015-11-03: 2 mg via INTRAVENOUS
  Administered 2015-11-03: 1 mg via INTRAVENOUS
  Administered 2015-11-03: 2 mg via INTRAVENOUS

## 2015-11-03 MED ORDER — MIDAZOLAM HCL 5 MG/ML IJ SOLN
INTRAMUSCULAR | Status: AC
  Filled 2015-11-03: qty 2

## 2015-11-03 MED ORDER — FENTANYL CITRATE (PF) 100 MCG/2ML IJ SOLN
INTRAMUSCULAR | Status: AC
  Filled 2015-11-03: qty 2

## 2015-11-03 MED ORDER — FENTANYL CITRATE (PF) 100 MCG/2ML IJ SOLN
INTRAMUSCULAR | Status: DC | PRN
  Administered 2015-11-03 (×2): 25 ug via INTRAVENOUS

## 2015-11-03 MED ORDER — DIPHENHYDRAMINE HCL 50 MG/ML IJ SOLN
INTRAMUSCULAR | Status: DC | PRN
  Administered 2015-11-03: 12.5 mg via INTRAVENOUS

## 2015-11-03 MED ORDER — DIPHENHYDRAMINE HCL 50 MG/ML IJ SOLN
INTRAMUSCULAR | Status: AC
  Filled 2015-11-03: qty 1

## 2015-11-03 MED ORDER — SODIUM CHLORIDE 0.9 % IV SOLN: INTRAVENOUS | Status: DC

## 2015-11-03 NOTE — Progress Notes (Signed)
Ref: Philis Fendt, MD   Subjective:  Awaiting EGD. Feeling better. No chest pain. Hgb 9.5.  Objective:  Vital Signs in the last 24 hours: Temp:  [98.7 F (37.1 C)-99 F (37.2 C)] 98.7 F (37.1 C) (01/14 2014) Pulse Rate:  [51-106] 106 (01/14 2014) Cardiac Rhythm:  [-] Sinus bradycardia (01/14 1911) Resp:  [11-22] 18 (01/14 2014) BP: (128-176)/(45-82) 133/60 mmHg (01/14 2014) SpO2:  [91 %-100 %] 91 % (01/14 2014) Weight:  [88.179 kg (194 lb 6.4 oz)] 88.179 kg (194 lb 6.4 oz) (01/14 0626)  Physical Exam: BP Readings from Last 1 Encounters:  11/03/15 133/60    Wt Readings from Last 1 Encounters:  11/03/15 88.179 kg (194 lb 6.4 oz)    Weight change: -0.771 kg (-1 lb 11.2 oz)  HEENT: [place]/AT, Eyes-Brown, PERL, EOMI, Conjunctiva-Pale pink, Sclera-Non-icteric Neck: No JVD, No bruit, Trachea midline. Lungs:  Clear, Bilateral. Cardiac:  Regular rhythm, normal S1 and S2, no S3. II/VI systolic murmur. Abdomen:  Soft, non-tender. Extremities:  No edema present. No cyanosis. No clubbing. CNS: AxOx3, Cranial nerves grossly intact, moves all 4 extremities. Right handed. Skin: Warm and dry.   Intake/Output from previous day: 01/13 0701 - 01/14 0700 In: 1015 [P.O.:680; Blood:335] Out: 1950 [Urine:1950]    Lab Results: BMET    Component Value Date/Time   NA 134* 11/02/2015 0801   NA 135 11/01/2015 1830   NA 134* 07/24/2015 1023   K 3.7 11/02/2015 0801   K 3.8 11/01/2015 1830   K 4.1 07/24/2015 1023   CL 110 11/02/2015 0801   CL 111 11/01/2015 1830   CL 107 07/24/2015 1023   CO2 21* 11/02/2015 0801   CO2 19* 11/01/2015 1830   CO2 24 07/24/2015 1023   GLUCOSE 93 11/02/2015 0801   GLUCOSE 100* 11/01/2015 1830   GLUCOSE 111* 07/24/2015 1023   BUN 10 11/02/2015 0801   BUN 13 11/01/2015 1830   BUN 9 07/24/2015 1023   CREATININE 0.92 11/02/2015 0801   CREATININE 1.16* 11/01/2015 1830   CREATININE 1.00 07/24/2015 1023   CALCIUM 8.7* 11/02/2015 0801   CALCIUM 8.7* 11/01/2015  1830   CALCIUM 9.3 07/24/2015 1023   GFRNONAA >60 11/02/2015 0801   GFRNONAA 47* 11/01/2015 1830   GFRNONAA 57* 07/24/2015 1023   GFRAA >60 11/02/2015 0801   GFRAA 55* 11/01/2015 1830   GFRAA >60 07/24/2015 1023   CBC    Component Value Date/Time   WBC 6.6 11/03/2015 0407   RBC 3.34* 11/03/2015 0407   HGB 9.5* 11/03/2015 0407   HCT 28.9* 11/03/2015 0407   PLT 149* 11/03/2015 0407   MCV 86.5 11/03/2015 0407   MCH 28.4 11/03/2015 0407   MCHC 32.9 11/03/2015 0407   RDW 20.3* 11/03/2015 0407   LYMPHSABS 2.2 11/01/2015 1830   MONOABS 0.7 11/01/2015 1830   EOSABS 0.1 11/01/2015 1830   BASOSABS 0.1 11/01/2015 1830   HEPATIC Function Panel  Recent Labs  11/01/15 1830  PROT >12.0*   HEMOGLOBIN A1C No components found for: HGA1C,  MPG CARDIAC ENZYMES Lab Results  Component Value Date   CKTOTAL 129 01/01/2012   CKMB 4.6* 01/01/2012   TROPONINI 0.03 11/01/2015   TROPONINI <0.03 11/01/2015   TROPONINI 0.38* 01/01/2012   BNP No results for input(s): PROBNP in the last 8760 hours. TSH  Recent Labs  11/01/15 1830  TSH 0.467   CHOLESTEROL  Recent Labs  11/02/15 0630  CHOL 86    Scheduled Meds: . sodium chloride   Intravenous Once  .  amLODipine  5 mg Oral Daily  . aspirin EC  81 mg Oral Daily  . cholecalciferol  1,000 Units Oral Daily  . FLUoxetine  10 mg Oral Daily  . hydrochlorothiazide  25 mg Oral Daily  . isosorbide mononitrate  60 mg Oral BID  . losartan  100 mg Oral Daily  . potassium chloride  10 mEq Oral BID  . pravastatin  40 mg Oral QPM  . sodium chloride  3 mL Intravenous Q12H   Continuous Infusions:  PRN Meds:.sodium chloride, acetaminophen, nitroGLYCERIN, ondansetron (ZOFRAN) IV, sodium chloride  Assessment/Plan: Symptomatic anemia Shortness of breath CAD S/P stent Hypertension H/O bilateral renal artery stenosis Dyslipidemia Tobacco use disorder Mitral regurgitation Tricuspid regurgitation  Awaiting EGD. Increase activity post  procedure.     LOS: 2 days    [name]  MD  11/03/2015, 8:57 PM

## 2015-11-03 NOTE — Interval H&P Note (Signed)
History and Physical Interval Note:  11/03/2015 1:15 PM  Abigail [name]  has presented today for surgery, with the diagnosis of unexplained anemia  The various methods of treatment have been discussed with the patient and family. After consideration of risks, benefits and other options for treatment, the patient has consented to  Procedure(s): ESOPHAGOGASTRODUODENOSCOPY (EGD) (N/A) as a surgical intervention .  The patient's history has been reviewed, patient examined, no change in status, stable for surgery.  I have reviewed the patient's chart and labs.  Questions were answered to the patient's satisfaction.     Aijalon Kirtz D

## 2015-11-03 NOTE — Op Note (Signed)
[facility] [place], [id number]   ENDOSCOPY PROCEDURE REPORT  PATIENT: Abigail [name], Abigail [name]  MR#: [id number] BIRTHDATE: Nov 24, 1945 , 69  yrs. old GENDER: female ENDOSCOPIST:Pinki Rottman Benson Norway, MD REFERRED BY: PROCEDURE DATE:  2015-11-29 PROCEDURE:   EGD w/ biopsy ASA CLASS:    Class III INDICATIONS: Anemia MEDICATION: Benadryl 12.5 mg IV, Versed 4 mg IV, and Fentanyl 50 mcg IV TOPICAL ANESTHETIC:   none  DESCRIPTION OF PROCEDURE:   After the risks and benefits of the procedure were explained, informed consent was obtained.  The Pentax Gastroscope [id number]  endoscope was introduced through the mouth  and advanced to the second portion of the duodenum .  The instrument was slowly withdrawn as the mucosa was fully examined. Estimated blood loss is zero unless otherwise noted in this procedure report.   FINDINGS: In the distal esophagus there was a mild Schaztki's ring in the setting of a 3 cm hiatal hernia.  In the gastric lumen multiple erosions covered with hematin were identified.  In the antrum several healing ulcerations (Images 005, 006, 010) were identified.  Cold biopsies were obtained.  The duodenal bulb revealed a mild duodenitis (Image 008).    Retroflexed views revealed no abnormalities.    The scope was then withdrawn from the patient and the procedure completed.  COMPLICATIONS: There were no immediate complications.  ENDOSCOPIC IMPRESSION: 1) Schatki's ring. 2) 3 cm hiatal hernia. 3) Gastric erosions. 4) Healing gastric antral ulcers - benign. 5) Mild duodenitis.  RECOMMENDATIONS: 1) Follow up biopsies. 2) PPI QD.   _______________________________ eSignedCarol Ada, MD 11/29/2015 1:38 PM     cc:  CPT CODES: ICD CODES:  The ICD and CPT codes recommended by this software are interpretations from the data that the clinical staff has captured with the software.  The verification of the translation of this report to  the ICD and CPT codes and modifiers is the sole responsibility of the health care institution and practicing physician where this report was generated.  [employer] will not be held responsible for the validity of the ICD and CPT codes included on this report.  AMA assumes no liability for data contained or not contained herein. CPT is a Designer, television/film set of the [employer].  PATIENT NAME:  [name], [name] MR#: [id number]

## 2015-11-03 NOTE — H&P (View-Only) (Signed)
[facility] Consult: 11:15 AM 11/02/2015  LOS: 1 day    Referring Provider: Dr Doylene Canard  Primary Care Physician:  Philis Fendt, MD Primary Gastroenterologist:  Dr. Hilarie Fredrickson    Reason for Consultation:  Anemia   HPI: NYOKA CLIPPARD is a 70 y.o. non infirm, generally high-functioning, AA female.  PMH hypertension, CAD, MI in 1999. Cardiac stenting 1999. S/P renal artery stenting for renal artery stenosis 1999.  Right retinal hemorrhage. Anemia.  S/P spinal surgery, appendectomy and abdominal hysterectomy.  Daily meds include 81 mg aspirin. She is prescribed Protonix 40 mg PRN.   Status post direct, outpatient screening colonoscopy 10/19/2015.  5 mm polyps 2 removed from  transverse and descending colon. (Pathology tubular adenoma no HGD).  Moderate left colon tics. Prior to the colonoscopy, the patient nor referring physician provided history of anemia though her hemoglobin was 8.2 on 07/24/2015. Abdominal/pelvic CT with contrast 07/2015 showed normal liver, GB, spleen, pancreas  Starting in late October, November she started noticing fatigue with activity. This is progressed.  She actually denies dyspnea on exertion just says that she gets quite fatigued and has to rest before the fatigue passes. She has had some exertional tachycardia but no sense of irregular heartbeat. No exertional chest pain. She was started on once daily oral iron in November. She has not noticed dark stool she is not passing blood per rectum, no melena. No GI upset, no dysphagia, no anorexia, no weight loss. She has never undergone upper endoscopy.  Although she bruises easily, she is not experienced large hematomas. She does not have nasal or oral bleeding and does not feel that she bleeds excessively if she worsens herself. She recalls having  been prescribed iron tablets as a young woman but has never required blood transfusions. Infrequent use of Aleve, takes 2 tablets maybe 2-3 times monthly. No alcohol.  Due to increasing exertional fatigue, Dr Doylene Canard admitted her for observation.  Hemoglobin 6.7, MCV 87. WBCs and platelets normal. PT/INR normal.  Slight increase in creatinine to 1.1 but normal BUN at 13. Iron, iron saturation normal. Total iron binding capacity is reduced. Ferritin is 141. FOBT testing not yet performed. CXR shows stable, mild cardiomegaly and borderline mild pulmonary edema.       Past Medical History  Diagnosis Date  . Hypertension   . Retinal hemorrhage, right eye   . Coronary artery disease   . Hyperlipidemia   . Depression   . GERD (gastroesophageal reflux disease)   . Anemia   . Cataract     extraction 2014  . Heart murmur   . MI (myocardial infarction) ([place])     1999    Past Surgical History  Procedure Laterality Date  . Cardiac stent    . Back surgery    . Appendectomy    . Abdominal hysterectomy      Prior to Admission medications   Medication Sig Start Date End Date Taking? Authorizing Provider  amLODipine (NORVASC) 5 MG tablet Take 10 mg by mouth daily.  07/13/15  Yes Historical Provider, MD  aspirin 81 MG tablet Take 81 mg by mouth daily.   Yes Historical Provider, MD  Cholecalciferol (VITAMIN D PO) Take 1 capsule by mouth daily.   Yes Historical Provider, MD  FLUoxetine (PROZAC) 10 MG capsule Take 10 mg by mouth daily.   Yes Historical Provider, MD  hydrochlorothiazide (HYDRODIURIL) 25 MG tablet Take 25 mg by mouth daily. 07/13/15  Yes Historical Provider, MD  IRON PO Take 1 tablet by mouth every evening.   Yes Historical Provider, MD  isosorbide mononitrate (IMDUR) 60 MG 24 hr tablet Take 60 mg by mouth 2 (two) times daily.    Yes Historical Provider, MD  KLOR-CON 10 10 MEQ tablet Take 10 mEq by mouth daily. 05/15/15  Yes Historical Provider, MD  losartan (COZAAR) 100 MG tablet  Take 100 mg by mouth daily. 07/13/15  Yes Historical Provider, MD  Multiple Vitamins-Minerals (MULTIVITAMIN ADULT PO) Take by mouth. Reported on 10/19/2015   Yes Historical Provider, MD  pantoprazole (PROTONIX) 40 MG tablet Take 40 mg by mouth daily as needed (acid reflux). Reported on 10/19/2015 07/22/15  Yes Historical Provider, MD  pravastatin (PRAVACHOL) 40 MG tablet Take 40 mg by mouth every evening. 06/03/15  Yes Historical Provider, MD    Scheduled Meds: . sodium chloride   Intravenous Once  . amLODipine  5 mg Oral Daily  . aspirin EC  81 mg Oral Daily  . cholecalciferol  1,000 Units Oral Daily  . FLUoxetine  10 mg Oral Daily  . hydrochlorothiazide  25 mg Oral Daily  . isosorbide mononitrate  60 mg Oral BID  . losartan  100 mg Oral Daily  . potassium chloride  10 mEq Oral Daily  . pravastatin  40 mg Oral QPM  . sodium chloride  3 mL Intravenous Q12H   Infusions:   PRN Meds: sodium chloride, acetaminophen, nitroGLYCERIN, ondansetron (ZOFRAN) IV, sodium chloride   Allergies as of 11/01/2015 - Review Complete 11/01/2015  Allergen Reaction Noted  . Sulfa antibiotics Rash 12/30/2011    Family History  Problem Relation Age of Onset  . Colon cancer Neg Hx    positive for coronary artery disease.  Negative for sickle cell disease or sickle cell trait or anemias of any sort.  Social History   Social History  . Marital Status: Single    Spouse Name: N/A  . Number of Children: N/A  . Years of Education: N/A   Occupational History  . Not on file.   Social History Main Topics  . Smoking status: Current Every Day Smoker -- 0.50 packs/day    Types: Cigarettes  . Smokeless tobacco: Never Used  . Alcohol Use: No  . Drug Use: No  . Sexual Activity: No   Other Topics Concern  . Not on file   Social History Narrative    REVIEW OF SYSTEMS: Constitutional:  Per HPI.   ENT:  No nose bleeds Pulm:  Per HPI CV:  No edema.  Per HPI GU:  No hematuria, no frequency.  GI:   Per HPI.   Heme:  Per HPI   Transfusions:  Per HPI Neuro:  No headaches, no peripheral tingling or numbness Derm:  No itching, no rash or sores.  Endocrine:  No sweats or chills.  No polyuria or dysuria Immunization:  Did not inquire as to immunizations status. Travel:  None beyond local counties in last few months.    PHYSICAL EXAM: Vital signs in last 24 hours: Filed Vitals:   11/02/15 0833 11/02/15 1048  BP: 165/71  164/71  Pulse: 64 96  Temp: 98.5 F (36.9 C) 98.5 F (36.9 C)  Resp: 18 18   Wt Readings from Last 3 Encounters:  11/02/15 89.812 kg (198 lb)  10/19/15 92.987 kg (205 lb)  10/05/15 92.987 kg (205 lb)   General: Very pleasant, comfortable, well appearing AAF Head:  No facial asymmetry or swelling. No signs of head trauma.  Eyes:  No scleral icterus. No conjunctival pallor. EOMI. Ears:  Not hard of hearing.  Nose:  No discharge or congestion. Mouth:  Mucous membranes are pink, moist and clear. She is edentulous. Wears dentures but they are not in place currently Neck:  No JVD, no masses, no TMG. Lungs:  Lungs are clear to auscultation and percussion bilaterally. No cough. No dyspnea at rest or with speech. Heart: RRR. No MRG. S1/S2 audible. Abdomen:  Soft, not tender, not distended. No organomegaly, bruits, masses, hernias. Bowel sounds active..   Rectal: Digital exam performed. No stool obtained. No masses.   Musc/Skeltl: No joint contracture deformities, swelling. There is a scar on her lower back consistent with spinal surgery. Extremities:  No CCE. Feet are warm and well perfused.  Neurologic:  Patient is alert, oriented 3. Good historian. Moves all 4 limbs, strength grossly normal but not formally tested. No tremors. No gross neurologic deficits. Skin:  No telangiectasia, rashes, sores or suspicious lesions. Tattoos:  None. Nodes:  No cervical or inguinal adenopathy.   Psych:  Pleasant, calm and cooperative.  Intake/Output from previous day: 01/12 0701  - 01/13 0700 In: 705 [P.O.:340; Blood:365] Out: 1150 [Urine:1150] Intake/Output this shift: Total I/O In: 335 [Blood:335] Out: 450 [Urine:450]  LAB RESULTS:  Recent Labs  11/01/15 1830  WBC 6.6  HGB 6.7*  HCT 21.7*  PLT 160   BMET Lab Results  Component Value Date   NA 134* 11/02/2015   NA 135 11/01/2015   NA 134* 07/24/2015   K 3.7 11/02/2015   K 3.8 11/01/2015   K 4.1 07/24/2015   CL 110 11/02/2015   CL 111 11/01/2015   CL 107 07/24/2015   CO2 21* 11/02/2015   CO2 19* 11/01/2015   CO2 24 07/24/2015   GLUCOSE 93 11/02/2015   GLUCOSE 100* 11/01/2015   GLUCOSE 111* 07/24/2015   BUN 10 11/02/2015   BUN 13 11/01/2015   BUN 9 07/24/2015   CREATININE 0.92 11/02/2015   CREATININE 1.16* 11/01/2015   CREATININE 1.00 07/24/2015   CALCIUM 8.7* 11/02/2015   CALCIUM 8.7* 11/01/2015   CALCIUM 9.3 07/24/2015   LFT  Recent Labs  11/01/15 1830  PROT >12.0*  ALBUMIN 2.5*  AST 20  ALT 13*  ALKPHOS 72  BILITOT 0.4   PT/INR Lab Results  Component Value Date   INR 1.26 11/02/2015   INR 1.14 12/31/2011   INR 1.07 12/30/2011   Hepatitis Panel No results for input(s): HEPBSAG, HCVAB, HEPAIGM, HEPBIGM in the last 72 hours. C-Diff No components found for: CDIFF Lipase  No results found for: LIPASE  Drugs of Abuse  No results found for: LABOPIA, COCAINSCRNUR, LABBENZ, AMPHETMU, THCU, LABBARB   RADIOLOGY STUDIES: Dg Chest 2 View  11/01/2015  CLINICAL DATA:  Shortness of breath.  Weakness. EXAM: CHEST  2 VIEW COMPARISON:  07/24/2015 chest radiograph. FINDINGS: Stable cardiomediastinal silhouette with mild cardiomegaly. No pneumothorax. No pleural effusion. Borderline mild pulmonary edema. No acute consolidative airspace disease. IMPRESSION: Stable mild cardiomegaly.  Borderline mild pulmonary edema. Electronically Signed   By: Ilona Sorrel M.D.   On: 11/01/2015  21:06    ENDOSCOPIC STUDIES: Per HPI  IMPRESSION:   *  Normocytic anemia.  Yesterday's Iron studies  not consistent with iron deficiency anemia, however she has been on once daily iron tablets for a couple of months. However, no documentation to confirm that she ever was actually iron deficient and MCV was normal back in October.. 10/19/15 screening colonoscopy revealed 2 polyps, tubular adenomas, moderate left-sided diverticulosis.  *  History MI, coronary artery disease. Previous cardiac stenting 1999.  Previously on Plavix.  *  History renal artery stenosis bilaterally. Status post stenting in 1999.  Labs do not indicate significant chronic kidney disease.   PLAN:     *  Per Dr. Hilarie Fredrickson.  Patient currently nothing by mouth, if there are no plans for endoscopic evaluation today I will advance her to a solid diet.    Azucena Freed  11/02/2015, 11:15 AM Pager: [phone]

## 2015-11-04 LAB — CBC
HEMATOCRIT: 29.3 % — AB (ref 36.0–46.0)
Hemoglobin: 9.3 g/dL — ABNORMAL LOW (ref 12.0–15.0)
MCH: 27.8 pg (ref 26.0–34.0)
MCHC: 31.7 g/dL (ref 30.0–36.0)
MCV: 87.5 fL (ref 78.0–100.0)
PLATELETS: 156 10*3/uL (ref 150–400)
RBC: 3.35 MIL/uL — ABNORMAL LOW (ref 3.87–5.11)
RDW: 20.4 % — AB (ref 11.5–15.5)
WBC: 6 10*3/uL (ref 4.0–10.5)

## 2015-11-04 MED ORDER — ISOSORBIDE MONONITRATE ER 60 MG PO TB24: 120.0000 mg | ORAL_TABLET | Freq: Two times a day (BID) | ORAL | Status: DC

## 2015-11-04 NOTE — Discharge Summary (Signed)
Physician Discharge Summary  Patient ID: Abigail [name] MRN: [id number] DOB/AGE: 27-Aug-1946 70 y.o.  Admit date: 11/01/2015 Discharge date: 11/04/2015  Admission Diagnoses: Symptomatic anemia Shortness of breath CAD S/P stent Hypertension H/O bilateral renal artery stenosis Dyslipidemia Tobacco use disorder  Discharge Diagnoses:  Principal Problem: *Acute on chronic GI blood loss*   Active Problems:   Symptomatic anemia   Gastric antral ulcers    Gastric erosions   Pulmonary edema   Shortness of breath   CAD   S/P stent   Hypertension   H/O bilateral renal artery stenosis   Dyslipidemia   Tobacco use disorder   Mitral regurgitation   Tricuspid regurgitation  Discharged Condition: fair  Hospital Course: 70 year old female with past medical history significant for coronary artery disease, history of MI in the past, status post PTCA stenting in 1999, history of bilateral renal artery stenosis treated with stenting, tobacco abuse and positive family history of coronary artery disease has shortness of breath on exertion x 1 month. Echocardiogram done in office showed Mild MR and TR with dilated LA. She had no fever, chills or cough. Post 2 units of blood transfusion her Hgb remained stable. She underwent EGD by Dr. Carol Ada showing 3 cm hiatal hernia, gastric erosions and healing gastric antral ulcers. Her aspirin was discontinued, iron supplement was resumed with chronic PPI treatment. She will be followed by primary care in 1 month and by me in 1 week.  Consults: GI  Significant Diagnostic Studies: labs: Hgb was 6.7 on admission and 9.3 on day of discharge. WBC and platelets counts were normal. Lipid panel was normal.  EKG-NSR.  Chest X-ray: Stable cardiomegaly with mild pulmonary edema  Upper endoscopy: 1) Schatki's ring. 2) 3 cm hiatal hernia. 3) Gastric erosions. 4) Healing gastric antral ulcers - benign. 5) Mild duodenitis.  Treatments: Blood transfusions and  EGD.  Discharge Exam: Blood pressure 154/71, pulse 85, temperature 98.7 F (37.1 C), temperature source Oral, resp. rate 18, height 5\' 5"  (1.651 m), weight 88.633 kg (195 lb 6.4 oz), SpO2 98 %. HEENT: [place]/AT, Eyes-Brown, PERL, EOMI, Conjunctiva-Pale pink, Sclera-Non-icteric Neck: No JVD, No bruit, Trachea midline. Lungs: Clear, Bilateral. Cardiac: Regular rhythm, normal S1 and S2, no S3. II/VI systolic murmur. Abdomen: Soft, non-tender. Extremities: No edema present. No cyanosis. No clubbing. CNS: AxOx3, Cranial nerves grossly intact, moves all 4 extremities. Right handed. Skin: Warm and dry.  Disposition: 01-Home or Self Care     Medication List    STOP taking these medications        aspirin 81 MG tablet      TAKE these medications        amLODipine 5 MG tablet  Commonly known as:  NORVASC  Take 10 mg by mouth daily.     FLUoxetine 10 MG capsule  Commonly known as:  PROZAC  Take 10 mg by mouth daily.     hydrochlorothiazide 25 MG tablet  Commonly known as:  HYDRODIURIL  Take 25 mg by mouth daily.     IRON PO  Take 1 tablet by mouth every evening.     isosorbide mononitrate 60 MG 24 hr tablet  Commonly known as:  IMDUR  Take 2 tablets (120 mg total) by mouth 2 (two) times daily.     KLOR-CON 10 10 MEQ tablet  Generic drug:  potassium chloride  Take 10 mEq by mouth daily.     losartan 100 MG tablet  Commonly known as:  COZAAR  Take 100 mg by  mouth daily.     MULTIVITAMIN ADULT PO  Take by mouth. Reported on 10/19/2015     pantoprazole 40 MG tablet  Commonly known as:  PROTONIX  Take 40 mg by mouth daily as needed (acid reflux). Reported on 10/19/2015     pravastatin 40 MG tablet  Commonly known as:  PRAVACHOL  Take 40 mg by mouth every evening.     VITAMIN D PO  Take 1 capsule by mouth daily.           Follow-up Information    Follow up with AVBUERE,EDWIN A, MD. Schedule an appointment as soon as possible for a visit in 1 week.   Specialty:   Internal Medicine   Contact information:   [address] [place] [address] [phone]       Follow up with [facility], MD. Schedule an appointment as soon as possible for a visit in 1 week.   Specialty:  Cardiology   Contact information:   [place] [address] [phone]       Follow up with PYRTLE, Lajuan Lines, MD. Schedule an appointment as soon as possible for a visit in 2 weeks.   Specialty:  Gastroenterology   Contact information:   [address] [place] [address] [phone]       Signed: Birdie Riddle 11/04/2015, 10:36 AM

## 2015-11-04 NOTE — Progress Notes (Signed)
Subjective: No complaints.  Feeling well.  Objective: Vital signs in last 24 hours: Temp:  [98.7 F (37.1 C)] 98.7 F (37.1 C) (01/15 0430) Pulse Rate:  [51-106] 85 (01/15 0430) Resp:  [11-22] 18 (01/15 0430) BP: (128-176)/(45-82) 154/71 mmHg (01/15 0430) SpO2:  [91 %-100 %] 98 % (01/15 0430) Weight:  [88.633 kg (195 lb 6.4 oz)] 88.633 kg (195 lb 6.4 oz) (01/15 0430) Last BM Date: 11/01/15  Intake/Output from previous day: 01/14 0701 - 01/15 0700 In: 420 [P.O.:420] Out: 1700 [Urine:1700] Intake/Output this shift:    General appearance: alert and no distress GI: soft, non-tender; bowel sounds normal; no masses,  no organomegaly  Lab Results:  Recent Labs  11/02/15 1220 11/03/15 0407 11/04/15 0338  WBC 8.8 6.6 6.0  HGB 9.2* 9.5* 9.3*  HCT 28.4* 28.9* 29.3*  PLT 134* 149* 156   BMET  Recent Labs  11/01/15 1830 11/02/15 0801  NA 135 134*  K 3.8 3.7  CL 111 110  CO2 19* 21*  GLUCOSE 100* 93  BUN 13 10  CREATININE 1.16* 0.92  CALCIUM 8.7* 8.7*   LFT  Recent Labs  11/01/15 1830  PROT >12.0*  ALBUMIN 2.5*  AST 20  ALT 13*  ALKPHOS 72  BILITOT 0.4   PT/INR  Recent Labs  11/02/15 0801  LABPROT 15.9*  INR 1.26   Hepatitis Panel No results for input(s): HEPBSAG, HCVAB, HEPAIGM, HEPBIGM in the last 72 hours. C-Diff No results for input(s): CDIFFTOX in the last 72 hours. Fecal Lactopherrin No results for input(s): FECLLACTOFRN in the last 72 hours.  Studies/Results: No results found.  Medications:  Scheduled: . sodium chloride   Intravenous Once  . amLODipine  5 mg Oral Daily  . aspirin EC  81 mg Oral Daily  . cholecalciferol  1,000 Units Oral Daily  . FLUoxetine  10 mg Oral Daily  . hydrochlorothiazide  25 mg Oral Daily  . isosorbide mononitrate  60 mg Oral BID  . losartan  100 mg Oral Daily  . potassium chloride  10 mEq Oral BID  . pravastatin  40 mg Oral QPM  . sodium chloride  3 mL Intravenous Q12H   Continuous:    Assessment/Plan: 1) Healing antral ulcers. 2) Erosions with hematin.   She is stable.  The biopsies are pending for H. Pylori.    Plan: 1) PPI QD indefinitely. 2) Follow up biopsies. 3) Okay to [name]/C home. 4) Follow up with [facility] in 2-4 weeks.   LOS: 3 days   Abigail [name] 11/04/2015, 7:24 AM

## 2015-11-04 NOTE — Progress Notes (Signed)
Pt has orders to be discharged. Discharge instructions given and pt has no additional questions at this time. Medication regimen reviewed and pt educated. Pt verbalized understanding and has no additional questions. Telemetry box removed. IV removed and site in good condition. Pt stable and waiting for transportation. 

## 2015-11-05 ENCOUNTER — Encounter (HOSPITAL_COMMUNITY): Payer: Self-pay | Admitting: Gastroenterology

## 2015-12-17 ENCOUNTER — Encounter: Payer: Self-pay | Admitting: *Deleted

## 2015-12-25 ENCOUNTER — Ambulatory Visit: Payer: Medicare HMO | Admitting: Internal Medicine

## 2016-01-11 ENCOUNTER — Inpatient Hospital Stay (HOSPITAL_COMMUNITY)
Admission: AD | Admit: 2016-01-11 | Discharge: 2016-01-14 | DRG: 812 | Disposition: A | Discharge status: Home / Self Care | Payer: Medicare HMO | Source: Ambulatory Visit | Attending: Cardiovascular Disease | Admitting: Cardiovascular Disease

## 2016-01-11 ENCOUNTER — Encounter (HOSPITAL_COMMUNITY): Payer: Self-pay | Admitting: *Deleted

## 2016-01-11 DIAGNOSIS — Z79899 Other long term (current) drug therapy: Secondary | ICD-10-CM | POA: Diagnosis not present

## 2016-01-11 DIAGNOSIS — I2510 Atherosclerotic heart disease of native coronary artery without angina pectoris: Secondary | ICD-10-CM | POA: Diagnosis present

## 2016-01-11 DIAGNOSIS — Z9849 Cataract extraction status, unspecified eye: Secondary | ICD-10-CM

## 2016-01-11 DIAGNOSIS — K219 Gastro-esophageal reflux disease without esophagitis: Secondary | ICD-10-CM | POA: Diagnosis present

## 2016-01-11 DIAGNOSIS — I340 Nonrheumatic mitral (valve) insufficiency: Secondary | ICD-10-CM | POA: Diagnosis present

## 2016-01-11 DIAGNOSIS — Z8249 Family history of ischemic heart disease and other diseases of the circulatory system: Secondary | ICD-10-CM

## 2016-01-11 DIAGNOSIS — Z882 Allergy status to sulfonamides status: Secondary | ICD-10-CM | POA: Diagnosis not present

## 2016-01-11 DIAGNOSIS — D500 Iron deficiency anemia secondary to blood loss (chronic): Secondary | ICD-10-CM | POA: Diagnosis present

## 2016-01-11 DIAGNOSIS — Z8711 Personal history of peptic ulcer disease: Secondary | ICD-10-CM | POA: Diagnosis not present

## 2016-01-11 DIAGNOSIS — E785 Hyperlipidemia, unspecified: Secondary | ICD-10-CM | POA: Diagnosis present

## 2016-01-11 DIAGNOSIS — Z955 Presence of coronary angioplasty implant and graft: Secondary | ICD-10-CM | POA: Diagnosis not present

## 2016-01-11 DIAGNOSIS — F17210 Nicotine dependence, cigarettes, uncomplicated: Secondary | ICD-10-CM | POA: Diagnosis present

## 2016-01-11 DIAGNOSIS — Z90710 Acquired absence of both cervix and uterus: Secondary | ICD-10-CM

## 2016-01-11 DIAGNOSIS — I701 Atherosclerosis of renal artery: Secondary | ICD-10-CM | POA: Diagnosis present

## 2016-01-11 DIAGNOSIS — I071 Rheumatic tricuspid insufficiency: Secondary | ICD-10-CM | POA: Diagnosis present

## 2016-01-11 DIAGNOSIS — I252 Old myocardial infarction: Secondary | ICD-10-CM | POA: Diagnosis not present

## 2016-01-11 DIAGNOSIS — R0602 Shortness of breath: Secondary | ICD-10-CM | POA: Diagnosis present

## 2016-01-11 DIAGNOSIS — I10 Essential (primary) hypertension: Secondary | ICD-10-CM | POA: Diagnosis present

## 2016-01-11 LAB — COMPREHENSIVE METABOLIC PANEL
ALT: 17 U/L (ref 14–54)
ANION GAP: 4 — AB (ref 5–15)
AST: 26 U/L (ref 15–41)
Albumin: 2.4 g/dL — ABNORMAL LOW (ref 3.5–5.0)
Alkaline Phosphatase: 50 U/L (ref 38–126)
BILIRUBIN TOTAL: 0.6 mg/dL (ref 0.3–1.2)
BUN: 15 mg/dL (ref 6–20)
CHLORIDE: 113 mmol/L — AB (ref 101–111)
CO2: 16 mmol/L — ABNORMAL LOW (ref 22–32)
Calcium: 8.6 mg/dL — ABNORMAL LOW (ref 8.9–10.3)
Creatinine, Ser: 1.07 mg/dL — ABNORMAL HIGH (ref 0.44–1.00)
GFR calc Af Amer: 60 mL/min — ABNORMAL LOW (ref >60)
GFR, EST NON AFRICAN AMERICAN: 52 mL/min — AB (ref >60)
Glucose, Bld: 118 mg/dL — ABNORMAL HIGH (ref 65–99)
POTASSIUM: 3.6 mmol/L (ref 3.5–5.1)
Sodium: 133 mmol/L — ABNORMAL LOW (ref 135–145)

## 2016-01-11 LAB — CBC WITH DIFFERENTIAL/PLATELET
BASOS ABS: 0.1 10*3/uL (ref 0.0–0.1)
Basophils Relative: 1 %
EOS PCT: 2 %
Eosinophils Absolute: 0.1 10*3/uL (ref 0.0–0.7)
HEMATOCRIT: 20.5 % — AB (ref 36.0–46.0)
HEMOGLOBIN: 6.3 g/dL — AB (ref 12.0–15.0)
LYMPHS PCT: 29 %
Lymphs Abs: 1.8 10*3/uL (ref 0.7–4.0)
MCH: 27.2 pg (ref 26.0–34.0)
MCHC: 30.7 g/dL (ref 30.0–36.0)
MCV: 88.4 fL (ref 78.0–100.0)
MONOS PCT: 8 %
Monocytes Absolute: 0.5 10*3/uL (ref 0.1–1.0)
NEUTROS PCT: 60 %
Neutro Abs: 3.8 10*3/uL (ref 1.7–7.7)
Platelets: 149 10*3/uL — ABNORMAL LOW (ref 150–400)
RBC: 2.32 MIL/uL — AB (ref 3.87–5.11)
RDW: 25.4 % — AB (ref 11.5–15.5)
WBC: 6.3 10*3/uL (ref 4.0–10.5)

## 2016-01-11 LAB — PREPARE RBC (CROSSMATCH)

## 2016-01-11 MED ORDER — ACETAMINOPHEN 650 MG RE SUPP: 650.0000 mg | Freq: Four times a day (QID) | RECTAL | Status: DC | PRN

## 2016-01-11 MED ORDER — SODIUM CHLORIDE 0.9 % IV SOLN
INTRAVENOUS | Status: DC
  Administered 2016-01-11: 18:00:00 via INTRAVENOUS

## 2016-01-11 MED ORDER — FERROUS SULFATE 325 (65 FE) MG PO TABS
325.0000 mg | ORAL_TABLET | Freq: Two times a day (BID) | ORAL | Status: DC
  Administered 2016-01-12 – 2016-01-14 (×5): 325 mg via ORAL
  Filled 2016-01-11 (×5): qty 1

## 2016-01-11 MED ORDER — HYDROCHLOROTHIAZIDE 25 MG PO TABS
25.0000 mg | ORAL_TABLET | Freq: Every day | ORAL | Status: DC
  Administered 2016-01-11 – 2016-01-13 (×3): 25 mg via ORAL
  Filled 2016-01-11 (×3): qty 1

## 2016-01-11 MED ORDER — SODIUM CHLORIDE 0.9% FLUSH
3.0000 mL | Freq: Two times a day (BID) | INTRAVENOUS | Status: DC
Start: 2016-01-11 — End: 2016-01-14
  Administered 2016-01-11 – 2016-01-13 (×5): 3 mL via INTRAVENOUS

## 2016-01-11 MED ORDER — FLUOXETINE HCL 10 MG PO CAPS
10.0000 mg | ORAL_CAPSULE | Freq: Every day | ORAL | Status: DC
  Administered 2016-01-11 – 2016-01-14 (×4): 10 mg via ORAL
  Filled 2016-01-11 (×7): qty 1

## 2016-01-11 MED ORDER — PANTOPRAZOLE SODIUM 40 MG PO TBEC
40.0000 mg | DELAYED_RELEASE_TABLET | Freq: Every day | ORAL | Status: DC
  Administered 2016-01-11 – 2016-01-14 (×4): 40 mg via ORAL
  Filled 2016-01-11 (×4): qty 1

## 2016-01-11 MED ORDER — ISOSORBIDE MONONITRATE ER 60 MG PO TB24
120.0000 mg | ORAL_TABLET | Freq: Two times a day (BID) | ORAL | Status: DC
  Administered 2016-01-11 – 2016-01-14 (×6): 120 mg via ORAL
  Filled 2016-01-11 (×6): qty 2

## 2016-01-11 MED ORDER — AMLODIPINE BESYLATE 10 MG PO TABS
10.0000 mg | ORAL_TABLET | Freq: Every day | ORAL | Status: DC
  Administered 2016-01-11 – 2016-01-14 (×4): 10 mg via ORAL
  Filled 2016-01-11 (×4): qty 1

## 2016-01-11 MED ORDER — FERROUS SULFATE 325 (65 FE) MG PO TABS
325.0000 mg | ORAL_TABLET | Freq: Two times a day (BID) | ORAL | Status: DC
  Administered 2016-01-11: 325 mg via ORAL
  Filled 2016-01-11: qty 1

## 2016-01-11 MED ORDER — LOSARTAN POTASSIUM 50 MG PO TABS
100.0000 mg | ORAL_TABLET | Freq: Every day | ORAL | Status: DC
  Administered 2016-01-11 – 2016-01-14 (×4): 100 mg via ORAL
  Filled 2016-01-11 (×5): qty 2

## 2016-01-11 MED ORDER — DOCUSATE SODIUM 100 MG PO CAPS
100.0000 mg | ORAL_CAPSULE | Freq: Two times a day (BID) | ORAL | Status: DC
  Administered 2016-01-11 – 2016-01-14 (×6): 100 mg via ORAL
  Filled 2016-01-11 (×6): qty 1

## 2016-01-11 MED ORDER — VITAMIN D 1000 UNITS PO TABS
1000.0000 [IU] | ORAL_TABLET | Freq: Every day | ORAL | Status: DC
  Administered 2016-01-11 – 2016-01-14 (×4): 1000 [IU] via ORAL
  Filled 2016-01-11 (×4): qty 1

## 2016-01-11 MED ORDER — ONDANSETRON HCL 4 MG/2ML IJ SOLN: 4.0000 mg | Freq: Four times a day (QID) | INTRAMUSCULAR | Status: DC | PRN

## 2016-01-11 MED ORDER — PRAVASTATIN SODIUM 40 MG PO TABS
40.0000 mg | ORAL_TABLET | Freq: Every evening | ORAL | Status: DC
  Administered 2016-01-11 – 2016-01-13 (×3): 40 mg via ORAL
  Filled 2016-01-11 (×3): qty 1

## 2016-01-11 MED ORDER — ONDANSETRON HCL 4 MG PO TABS
4.0000 mg | ORAL_TABLET | Freq: Four times a day (QID) | ORAL | Status: DC | PRN
Start: 2016-01-11 — End: 2016-01-14

## 2016-01-11 MED ORDER — SODIUM CHLORIDE 0.9 % IV SOLN
Freq: Once | INTRAVENOUS | Status: AC
  Administered 2016-01-12: 01:00:00 via INTRAVENOUS

## 2016-01-11 MED ORDER — VITAMIN C 500 MG PO TABS
500.0000 mg | ORAL_TABLET | Freq: Two times a day (BID) | ORAL | Status: DC
  Administered 2016-01-11 – 2016-01-14 (×6): 500 mg via ORAL
  Filled 2016-01-11 (×6): qty 1

## 2016-01-11 MED ORDER — FUROSEMIDE 10 MG/ML IJ SOLN
20.0000 mg | Freq: Once | INTRAMUSCULAR | Status: AC
  Administered 2016-01-12: 20 mg via INTRAVENOUS
  Filled 2016-01-11: qty 2

## 2016-01-11 MED ORDER — ACETAMINOPHEN 325 MG PO TABS
650.0000 mg | ORAL_TABLET | Freq: Four times a day (QID) | ORAL | Status: DC | PRN
  Administered 2016-01-12 – 2016-01-14 (×2): 650 mg via ORAL
  Filled 2016-01-11 (×2): qty 2

## 2016-01-11 MED ORDER — POTASSIUM CHLORIDE ER 10 MEQ PO TBCR
10.0000 meq | EXTENDED_RELEASE_TABLET | Freq: Every day | ORAL | Status: DC
  Administered 2016-01-11 – 2016-01-13 (×3): 10 meq via ORAL
  Filled 2016-01-11 (×10): qty 1

## 2016-01-11 NOTE — Progress Notes (Signed)
Critical hemoglobin = 6.3, reported by Thornton Dales, Lab Clinician.  Reported these results to physician.  He stated that he will place orders for PRBCs to be given.

## 2016-01-11 NOTE — H&P (Signed)
Referring Physician:  [name] is an 70 y.o. female.                       Chief Complaint: Shortness of breath  HPI: 70 year old female with past medical history significant for coronary artery disease, history of MI in the past, status post PTCA stenting in 1999, history of bilateral renal artery stenosis treated with stenting, tobacco abuse and positive family history of coronary artery disease has shortness of breath on exertion x 1 month. Her recent CBC showed Hgb of less than 7 gm/dL.  Past Medical History  Diagnosis Date  . Hypertension   . Retinal hemorrhage, right eye   . Coronary artery disease 1999  . Hyperlipidemia   . Depression   . GERD (gastroesophageal reflux disease)   . Anemia 07/2015  . Cataract     extraction 2014  . Heart murmur   . MI (myocardial infarction) ([place])     1999  . Bilateral renal artery stenosis ([facility] [place]) 1999    s/p stenting  . Schatzki's ring   . Gastric erosions   . Gastric ulcer   . Duodenitis   . Tubular adenoma of colon   . Diverticulosis   . Anemia       Past Surgical History  Procedure Laterality Date  . Cardiac stent  1999  . Back surgery    . Appendectomy    . Abdominal hysterectomy    . Renal artery stent  1999    bil RAS so ? bil vs unilateral stents  . Esophagogastroduodenoscopy N/A 11/03/2015    Procedure: ESOPHAGOGASTRODUODENOSCOPY (EGD);  Surgeon: Carol Ada, MD;  Location: [facility] ENDOSCOPY;  Service: Endoscopy;  Laterality: N/A;    Family History  Problem Relation Age of Onset  . Colon cancer Neg Hx    Social History:  reports that she has been smoking Cigarettes.  She has been smoking about 0.50 packs per day. She has never used smokeless tobacco. She reports that she does not drink alcohol or use illicit drugs.  Allergies:  Allergies  Allergen Reactions  . Sulfa Antibiotics Rash    Medications Prior to Admission  Medication Sig Dispense Refill  . amLODipine (NORVASC) 5 MG tablet Take 10 mg by mouth daily.   1   . Cholecalciferol (VITAMIN D PO) Take 1 capsule by mouth daily.    Marland Kitchen FLUoxetine (PROZAC) 10 MG capsule Take 10 mg by mouth daily.    . hydrochlorothiazide (HYDRODIURIL) 25 MG tablet Take 25 mg by mouth daily.  1  . IRON PO Take 1 tablet by mouth every evening.    . isosorbide mononitrate (IMDUR) 60 MG 24 hr tablet Take 2 tablets (120 mg total) by mouth 2 (two) times daily. 60 tablet 3  . KLOR-CON 10 10 MEQ tablet Take 10 mEq by mouth daily.  6  . losartan (COZAAR) 100 MG tablet Take 100 mg by mouth daily.  1  . Multiple Vitamins-Minerals (MULTIVITAMIN ADULT PO) Take by mouth. Reported on 10/19/2015    . pantoprazole (PROTONIX) 40 MG tablet Take 40 mg by mouth daily as needed (acid reflux). Reported on 10/19/2015    . pravastatin (PRAVACHOL) 40 MG tablet Take 40 mg by mouth every evening.  1    No results found for this or any previous visit (from the past 48 hour(s)). No results found.  Review Of Systems Constitutional: Negative for fever, chills and weight loss.  HENT: Negative for hearing loss, neck pain and  tinnitus.  Eyes: Negative for blurred vision, double vision and photophobia.  Respiratory: Negative for cough, hemoptysis and sputum production.  Cardiovascular: Positive for chest pain. Negative for palpitations, orthopnea, claudication and leg swelling.  Gastrointestinal: Positive for nausea and vomiting. Negative for heartburn and abdominal pain.  Musculoskeletal: Negative for back pain.  Skin: Negative for itching and rash.  Neurological: Negative for dizziness and headaches.   There were no vitals taken for this visit.  P-63, R-17, BP-147/65, Ht-5\' 5"  and Weight about 193 lb. SpO2-99 % Physical Exam  Constitutional: She appears well-developed and well nourished.  HENT: Head: Normocephalic, atraumatic. Brown eyes, Conjunctivae are pale. No scleral icterus.  Neck: Normal range of motion. Neck supple. No JVD present. No tracheal deviation present. No thyromegaly  present.  Cardiovascular: Normal rate and regular rhythm. Exam reveals no friction rub.S1-S2 was normal  Respiratory: Effort normal and breath sounds normal. No respiratory distress. Chest wall non-tender.  GI: Soft. Bowel sounds are normal. She exhibits no distension. There is no tenderness.  Musculoskeletal: She exhibits no edema and no tenderness.  Lymphadenopathy: She has no cervical adenopathy.  Neurological: She is alert and oriented to person, place, and time. Moves all 4 extremities. Skin: Warm, dry and pale.  Assessment/Plan Symptomatic anemia Shortness of breath CAD S/P stent Hypertension H/O bilateral renal artery stenosis Dyslipidemia Tobacco use disorder Mitral regurgitation Tricuspid regurgitation  Admit/Blood transfusion/GI consult  Birdie Riddle, MD  01/11/2016, 4:11 PM

## 2016-01-12 LAB — CBC
HEMATOCRIT: 25.8 % — AB (ref 36.0–46.0)
HEMOGLOBIN: 8.1 g/dL — AB (ref 12.0–15.0)
MCH: 27.4 pg (ref 26.0–34.0)
MCHC: 31.4 g/dL (ref 30.0–36.0)
MCV: 87.2 fL (ref 78.0–100.0)
Platelets: 145 10*3/uL — ABNORMAL LOW (ref 150–400)
RBC: 2.96 MIL/uL — ABNORMAL LOW (ref 3.87–5.11)
RDW: 22 % — AB (ref 11.5–15.5)
WBC: 6.2 10*3/uL (ref 4.0–10.5)

## 2016-01-12 LAB — BASIC METABOLIC PANEL
ANION GAP: 4 — AB (ref 5–15)
BUN: 15 mg/dL (ref 6–20)
CALCIUM: 8.7 mg/dL — AB (ref 8.9–10.3)
CO2: 17 mmol/L — AB (ref 22–32)
Chloride: 112 mmol/L — ABNORMAL HIGH (ref 101–111)
Creatinine, Ser: 0.97 mg/dL (ref 0.44–1.00)
GFR calc Af Amer: >60 mL/min (ref >60)
GFR calc non Af Amer: 58 mL/min — ABNORMAL LOW (ref >60)
GLUCOSE: 94 mg/dL (ref 65–99)
POTASSIUM: 3.8 mmol/L (ref 3.5–5.1)
Sodium: 133 mmol/L — ABNORMAL LOW (ref 135–145)

## 2016-01-12 MED ORDER — FUROSEMIDE 10 MG/ML IJ SOLN
20.0000 mg | Freq: Once | INTRAMUSCULAR | Status: AC
  Administered 2016-01-12: 20 mg via INTRAVENOUS
  Filled 2016-01-12: qty 2

## 2016-01-12 NOTE — Progress Notes (Signed)
Ref: Philis Fendt, MD   Subjective:  Feeling better. Hgb improved to 8.1. Back on Protonix.   Objective:  Vital Signs in the last 24 hours: Temp:  [97.9 F (36.6 C)-98.6 F (37 C)] 98.3 F (36.8 C) (03/25 1230) Pulse Rate:  [58-70] 58 (03/25 1230) Cardiac Rhythm:  [-] Normal sinus rhythm (03/25 1900) Resp:  [16-21] 20 (03/25 1230) BP: (132-154)/(63-74) 143/66 mmHg (03/25 1230) SpO2:  [92 %-99 %] 98 % (03/25 1230) Weight:  [88.225 kg (194 lb 8 oz)] 88.225 kg (194 lb 8 oz) (03/25 0346)  Physical Exam: BP Readings from Last 1 Encounters:  01/12/16 143/66    Wt Readings from Last 1 Encounters:  01/12/16 88.225 kg (194 lb 8 oz)    Weight change:   HEENT: [place]/AT, Eyes-Brown, PERL, EOMI, Conjunctiva-Pale, Sclera-Non-icteric Neck: No JVD, No bruit, Trachea midline. Lungs:  Clear, Bilateral. Cardiac:  Regular rhythm, normal S1 and S2, no S3.  Abdomen:  Soft, non-tender. Extremities:  No edema present. No cyanosis. No clubbing. CNS: AxOx3, Cranial nerves grossly intact, moves all 4 extremities. Right handed. Skin: Warm and dry.   Intake/Output from previous day: 03/24 0701 - 03/25 0700 In: 1315 [P.O.:655; Blood:660] Out: 1300 [Urine:1300]    Lab Results: BMET    Component Value Date/Time   NA 133* 01/12/2016 0626   NA 133* 01/11/2016 1819   NA 134* 11/02/2015 0801   K 3.8 01/12/2016 0626   K 3.6 01/11/2016 1819   K 3.7 11/02/2015 0801   CL 112* 01/12/2016 0626   CL 113* 01/11/2016 1819   CL 110 11/02/2015 0801   CO2 17* 01/12/2016 0626   CO2 16* 01/11/2016 1819   CO2 21* 11/02/2015 0801   GLUCOSE 94 01/12/2016 0626   GLUCOSE 118* 01/11/2016 1819   GLUCOSE 93 11/02/2015 0801   BUN 15 01/12/2016 0626   BUN 15 01/11/2016 1819   BUN 10 11/02/2015 0801   CREATININE 0.97 01/12/2016 0626   CREATININE 1.07* 01/11/2016 1819   CREATININE 0.92 11/02/2015 0801   CALCIUM 8.7* 01/12/2016 0626   CALCIUM 8.6* 01/11/2016 1819   CALCIUM 8.7* 11/02/2015 0801   GFRNONAA 58*  01/12/2016 0626   GFRNONAA 52* 01/11/2016 1819   GFRNONAA >60 11/02/2015 0801   GFRAA >60 01/12/2016 0626   GFRAA 60* 01/11/2016 1819   GFRAA >60 11/02/2015 0801   CBC    Component Value Date/Time   WBC 6.2 01/12/2016 0626   RBC 2.96* 01/12/2016 0626   HGB 8.1* 01/12/2016 0626   HCT 25.8* 01/12/2016 0626   PLT 145* 01/12/2016 0626   MCV 87.2 01/12/2016 0626   MCH 27.4 01/12/2016 0626   MCHC 31.4 01/12/2016 0626   RDW 22.0* 01/12/2016 0626   LYMPHSABS 1.8 01/11/2016 1819   MONOABS 0.5 01/11/2016 1819   EOSABS 0.1 01/11/2016 1819   BASOSABS 0.1 01/11/2016 1819   HEPATIC Function Panel  Recent Labs  11/01/15 1830 01/11/16 1819  PROT >12.0* >12.0*   HEMOGLOBIN A1C No components found for: HGA1C,  MPG CARDIAC ENZYMES Lab Results  Component Value Date   CKTOTAL 129 01/01/2012   CKMB 4.6* 01/01/2012   TROPONINI 0.03 11/01/2015   TROPONINI <0.03 11/01/2015   TROPONINI 0.38* 01/01/2012   BNP No results for input(s): PROBNP in the last 8760 hours. TSH  Recent Labs  11/01/15 1830  TSH 0.467   CHOLESTEROL  Recent Labs  11/02/15 0630  CHOL 86    Scheduled Meds: . amLODipine  10 mg Oral Daily  . cholecalciferol  1,000 Units Oral Daily  . docusate sodium  100 mg Oral BID  . ferrous sulfate  325 mg Oral BID WC  . FLUoxetine  10 mg Oral Daily  . hydrochlorothiazide  25 mg Oral Daily  . isosorbide mononitrate  120 mg Oral BID  . losartan  100 mg Oral Daily  . pantoprazole  40 mg Oral Daily  . potassium chloride  10 mEq Oral Daily  . pravastatin  40 mg Oral QPM  . sodium chloride flush  3 mL Intravenous Q12H  . vitamin C  500 mg Oral BID   Continuous Infusions: . sodium chloride 20 mL/hr at 01/11/16 1746   PRN Meds:.acetaminophen **OR** acetaminophen, ondansetron **OR** ondansetron (ZOFRAN) IV  Assessment/Plan: Symptomatic anemia of chronic blood loss Shortness of breath CAD S/P stent Hypertension H/O bilateral renal artery  stenosis Dyslipidemia Tobacco use disorder Mitral regurgitation Tricuspid regurgitation  Recheck Hgb in AM.     LOS: 1 day    [name]  MD  01/12/2016, 9:25 PM     Feeling better. Hgb improved to

## 2016-01-13 LAB — BASIC METABOLIC PANEL
Anion gap: 4 — ABNORMAL LOW (ref 5–15)
BUN: 22 mg/dL — AB (ref 6–20)
CHLORIDE: 110 mmol/L (ref 101–111)
CO2: 19 mmol/L — ABNORMAL LOW (ref 22–32)
Calcium: 8.6 mg/dL — ABNORMAL LOW (ref 8.9–10.3)
Creatinine, Ser: 1.13 mg/dL — ABNORMAL HIGH (ref 0.44–1.00)
GFR calc Af Amer: 56 mL/min — ABNORMAL LOW (ref >60)
GFR calc non Af Amer: 48 mL/min — ABNORMAL LOW (ref >60)
Glucose, Bld: 87 mg/dL (ref 65–99)
POTASSIUM: 3.5 mmol/L (ref 3.5–5.1)
Sodium: 133 mmol/L — ABNORMAL LOW (ref 135–145)

## 2016-01-13 LAB — CBC
HCT: 25.9 % — ABNORMAL LOW (ref 36.0–46.0)
Hemoglobin: 8.1 g/dL — ABNORMAL LOW (ref 12.0–15.0)
MCH: 27.4 pg (ref 26.0–34.0)
MCHC: 31.3 g/dL (ref 30.0–36.0)
MCV: 87.5 fL (ref 78.0–100.0)
PLATELETS: 144 10*3/uL — AB (ref 150–400)
RBC: 2.96 MIL/uL — ABNORMAL LOW (ref 3.87–5.11)
RDW: 22.5 % — AB (ref 11.5–15.5)
WBC: 6.4 10*3/uL (ref 4.0–10.5)

## 2016-01-13 LAB — HEMOGLOBIN AND HEMATOCRIT, BLOOD
HCT: 31.1 % — ABNORMAL LOW (ref 36.0–46.0)
HEMOGLOBIN: 9.9 g/dL — AB (ref 12.0–15.0)

## 2016-01-13 LAB — PREPARE RBC (CROSSMATCH)

## 2016-01-13 MED ORDER — HYDROCHLOROTHIAZIDE 25 MG PO TABS
12.5000 mg | ORAL_TABLET | Freq: Every day | ORAL | Status: DC
  Administered 2016-01-14: 12.5 mg via ORAL
  Filled 2016-01-13: qty 1

## 2016-01-13 MED ORDER — SODIUM CHLORIDE 0.9 % IV SOLN
Freq: Once | INTRAVENOUS | Status: AC
  Administered 2016-01-13: 14:00:00 via INTRAVENOUS

## 2016-01-13 MED ORDER — SUCRALFATE 1 GM/10ML PO SUSP
1.0000 g | Freq: Three times a day (TID) | ORAL | Status: DC
  Administered 2016-01-13 – 2016-01-14 (×3): 1 g via ORAL
  Filled 2016-01-13 (×3): qty 10

## 2016-01-13 NOTE — Progress Notes (Signed)
Ref: Philis Fendt, MD   Subjective:  Feeling better but short of breath with walking 50 feet.  Objective:  Vital Signs in the last 24 hours: Temp:  [98.2 F (36.8 C)-99 F (37.2 C)] 98.2 F (36.8 C) (03/26 1055) Pulse Rate:  [57-58] 57 (03/26 0448) Cardiac Rhythm:  [-] Sinus bradycardia (03/26 0809) Resp:  [18-20] 18 (03/26 0448) BP: (135-146)/(59-66) 146/66 mmHg (03/26 1055) SpO2:  [92 %-98 %] 92 % (03/26 0448) Weight:  [87.454 kg (192 lb 12.8 oz)] 87.454 kg (192 lb 12.8 oz) (03/26 0448)  Physical Exam: BP Readings from Last 1 Encounters:  01/13/16 146/66    Wt Readings from Last 1 Encounters:  01/13/16 87.454 kg (192 lb 12.8 oz)    Weight change: -0.499 kg (-1 lb 1.6 oz)  HEENT: [place]/AT, Eyes-Brown, PERL, EOMI, Conjunctiva-Pale, Sclera-Non-icteric Neck: No JVD, No bruit, Trachea midline. Lungs:  Clear, Bilateral. Cardiac:  Regular rhythm, normal S1 and S2, no S3.  Abdomen:  Soft, non-tender. Extremities:  No edema present. No cyanosis. No clubbing. CNS: AxOx3, Cranial nerves grossly intact, moves all 4 extremities. Right handed. Skin: Warm and dry.   Intake/Output from previous day: 03/25 0701 - 03/26 0700 In: 720 [P.O.:720] Out: 2150 [Urine:2150]    Lab Results: BMET    Component Value Date/Time   NA 133* 01/13/2016 0327   NA 133* 01/12/2016 0626   NA 133* 01/11/2016 1819   K 3.5 01/13/2016 0327   K 3.8 01/12/2016 0626   K 3.6 01/11/2016 1819   CL 110 01/13/2016 0327   CL 112* 01/12/2016 0626   CL 113* 01/11/2016 1819   CO2 19* 01/13/2016 0327   CO2 17* 01/12/2016 0626   CO2 16* 01/11/2016 1819   GLUCOSE 87 01/13/2016 0327   GLUCOSE 94 01/12/2016 0626   GLUCOSE 118* 01/11/2016 1819   BUN 22* 01/13/2016 0327   BUN 15 01/12/2016 0626   BUN 15 01/11/2016 1819   CREATININE 1.13* 01/13/2016 0327   CREATININE 0.97 01/12/2016 0626   CREATININE 1.07* 01/11/2016 1819   CALCIUM 8.6* 01/13/2016 0327   CALCIUM 8.7* 01/12/2016 0626   CALCIUM 8.6* 01/11/2016  1819   GFRNONAA 48* 01/13/2016 0327   GFRNONAA 58* 01/12/2016 0626   GFRNONAA 52* 01/11/2016 1819   GFRAA 56* 01/13/2016 0327   GFRAA >60 01/12/2016 0626   GFRAA 60* 01/11/2016 1819   CBC    Component Value Date/Time   WBC 6.4 01/13/2016 0327   RBC 2.96* 01/13/2016 0327   HGB 8.1* 01/13/2016 0327   HCT 25.9* 01/13/2016 0327   PLT 144* 01/13/2016 0327   MCV 87.5 01/13/2016 0327   MCH 27.4 01/13/2016 0327   MCHC 31.3 01/13/2016 0327   RDW 22.5* 01/13/2016 0327   LYMPHSABS 1.8 01/11/2016 1819   MONOABS 0.5 01/11/2016 1819   EOSABS 0.1 01/11/2016 1819   BASOSABS 0.1 01/11/2016 1819   HEPATIC Function Panel  Recent Labs  11/01/15 1830 01/11/16 1819  PROT >12.0* >12.0*   HEMOGLOBIN A1C No components found for: HGA1C,  MPG CARDIAC ENZYMES Lab Results  Component Value Date   CKTOTAL 129 01/01/2012   CKMB 4.6* 01/01/2012   TROPONINI 0.03 11/01/2015   TROPONINI <0.03 11/01/2015   TROPONINI 0.38* 01/01/2012   BNP No results for input(s): PROBNP in the last 8760 hours. TSH  Recent Labs  11/01/15 1830  TSH 0.467   CHOLESTEROL  Recent Labs  11/02/15 0630  CHOL 86    Scheduled Meds: . sodium chloride   Intravenous Once  .  amLODipine  10 mg Oral Daily  . cholecalciferol  1,000 Units Oral Daily  . docusate sodium  100 mg Oral BID  . ferrous sulfate  325 mg Oral BID WC  . FLUoxetine  10 mg Oral Daily  . hydrochlorothiazide  25 mg Oral Daily  . isosorbide mononitrate  120 mg Oral BID  . losartan  100 mg Oral Daily  . pantoprazole  40 mg Oral Daily  . potassium chloride  10 mEq Oral Daily  . pravastatin  40 mg Oral QPM  . sodium chloride flush  3 mL Intravenous Q12H  . sucralfate  1 g Oral TID WC & HS  . vitamin C  500 mg Oral BID   Continuous Infusions: . sodium chloride 20 mL/hr at 01/11/16 1746   PRN Meds:.acetaminophen **OR** acetaminophen, ondansetron **OR** ondansetron (ZOFRAN) IV  Assessment/Plan: Symptomatic anemia of chronic blood  loss Shortness of breath CAD S/P stent Hypertension H/O bilateral renal artery stenosis Dyslipidemia Tobacco use disorder Mitral regurgitation Tricuspid regurgitation   Transfuse 1 unit of PRBC. Home in AM if stable.  LOS: 2 days    [name]  MD  01/13/2016, 12:20 PM

## 2016-01-13 NOTE — Progress Notes (Signed)
Utilization review completed.  

## 2016-01-14 LAB — TYPE AND SCREEN
ABO/RH(D): O POS
ANTIBODY SCREEN: NEGATIVE
UNIT DIVISION: 0
UNIT DIVISION: 0
UNIT DIVISION: 0

## 2016-01-14 LAB — CBC
HCT: 28.8 % — ABNORMAL LOW (ref 36.0–46.0)
Hemoglobin: 9.1 g/dL — ABNORMAL LOW (ref 12.0–15.0)
MCH: 27.3 pg (ref 26.0–34.0)
MCHC: 31.6 g/dL (ref 30.0–36.0)
MCV: 86.5 fL (ref 78.0–100.0)
PLATELETS: 146 10*3/uL — AB (ref 150–400)
RBC: 3.33 MIL/uL — AB (ref 3.87–5.11)
RDW: 21.1 % — AB (ref 11.5–15.5)
WBC: 5.6 10*3/uL (ref 4.0–10.5)

## 2016-01-14 MED ORDER — DOCUSATE SODIUM 100 MG PO CAPS: 100.0000 mg | ORAL_CAPSULE | Freq: Every day | ORAL | Status: DC

## 2016-01-14 MED ORDER — HYDROCHLOROTHIAZIDE 12.5 MG PO TABS: 12.5000 mg | ORAL_TABLET | Freq: Every day | ORAL | Status: DC

## 2016-01-14 MED ORDER — POTASSIUM CHLORIDE CRYS ER 10 MEQ PO TBCR
10.0000 meq | EXTENDED_RELEASE_TABLET | Freq: Every day | ORAL | Status: DC
  Administered 2016-01-14: 10 meq via ORAL
  Filled 2016-01-14: qty 1

## 2016-01-14 MED ORDER — PANTOPRAZOLE SODIUM 40 MG PO TBEC: 40.0000 mg | DELAYED_RELEASE_TABLET | Freq: Every day | ORAL | Status: DC

## 2016-01-14 NOTE — Care Management Note (Signed)
Case Management Note  Patient Details  Name: Abigail [name] MRN: [id number] Date of Birth: 1946-10-11  Subjective/Objective:      Symptomatic anemia of chronic blood loss              Action/Plan: Discharge Planning: AVS reviewed: NCM spoke to pt at bedside. Pt states she lives at home alone. Requesting RW for home. Contacted [facility] DME rep, Jermaine for RW for scheduled dc today. Pt states she can afford her medications at home. Niece will assist with her care as needed. She plans to stay with sister for few days post dc.   PCPNolene Ebbs MD Expected Discharge Date:  01/14/2016                Expected Discharge Plan:  Home/Self Care  In-House Referral:  NA  Discharge planning Services  CM Consult  Post Acute Care Choice:  NA Choice offered to:  NA  DME Arranged:  [place] rolling DME Agency:  [place]:  NA HH Agency:  NA  Status of Service:  Completed, signed off  Medicare Important Message Given:  Yes Date Medicare IM Given:    Medicare IM give by:    Date Additional Medicare IM Given:    Additional Medicare Important Message give by:     If discussed at [place] of Stay Meetings, dates discussed:    Additional Comments:  Erenest Rasher, RN 01/14/2016, 9:39 AM

## 2016-01-14 NOTE — Care Management Important Message (Signed)
Important Message  Patient Details  Name: PADEE WADEL MRN: [id number] Date of Birth: March 24, 1946   Medicare Important Message Given:  Yes    Erenest Rasher, RN 01/14/2016, 9:33 AM

## 2016-01-14 NOTE — Discharge Summary (Signed)
Physician Discharge Summary  Patient ID: Abigail [name] MRN: [id number] DOB/AGE: 69-Oct-1947 69 y.o.  Admit date: 01/11/2016 Discharge date: 01/14/2016  Admission Diagnoses: Symptomatic anemia of chronic blood loss Shortness of breath CAD S/P stent Hypertension H/O bilateral renal artery stenosis Dyslipidemia Tobacco use disorder Mitral regurgitation Tricuspid regurgitation  Discharge Diagnoses:  Principal Problem: * Symptomatic anemia due to chronic blood loss * Active Problems:   Coronary atherosclerosis of native coronary artery   Iron deficiency anemia secondary to blood loss (chronic)   Shortness of breath at rest   S/P stent   Hypertension   H/O bilateral renal artery stenosis   Dyslipidemia   Tobacco use disorder   Mitral regurgitation   Tricuspid regurgitation   H/O gastric antral ulcers  Discharged Condition: fair  Hospital Course: 70 year old female with past medical history significant for coronary artery disease, history of MI in the past, status post PTCA stenting in 1999, history of bilateral renal artery stenosis treated with stenting, tobacco abuse and positive family history of coronary artery disease has shortness of breath on exertion x 1 month. Her recent CBC showed Hgb of less than 7 gm/dL. She had 2 units of packed RBC followed by one more unit transfusion for symptomatic anemia. She resumed Protonix and was discharged home in stable condition with outpatient GI follow up.  Consults: None  Significant Diagnostic Studies: labs: Hgb 6.3. Borderline glucose and creatinine levels improving post blood transfusion.  Treatments: Blood transfusions, lasix and Protonix.  Discharge Exam: Blood pressure 157/63, pulse 56, temperature 98.8 F (37.1 C), temperature source Oral, resp. rate 16, height 5\' 5"  (1.651 m), weight 87 kg (191 lb 12.8 oz), SpO2 96 %. HEENT: [place]/AT, Eyes-Brown, PERL, EOMI, Conjunctiva-Pale, Sclera-Non-icteric Neck: No JVD, No bruit, Trachea  midline. Lungs: Clear, Bilateral. Cardiac: Regular rhythm, normal S1 and S2, no S3.  Abdomen: Soft, non-tender. Extremities: No edema present. No cyanosis. No clubbing. CNS: AxOx3, Cranial nerves grossly intact, moves all 4 extremities. Right handed. Skin: Warm and dry.  Disposition: 01-Home or Self Care     Medication List    TAKE these medications        amLODipine 10 MG tablet  Commonly known as:  NORVASC  Take 10 mg by mouth daily.     docusate sodium 100 MG capsule  Commonly known as:  COLACE  Take 1 capsule (100 mg total) by mouth daily.     FLUoxetine 10 MG capsule  Commonly known as:  PROZAC  Take 10 mg by mouth daily.     hydrochlorothiazide 12.5 MG tablet  Commonly known as:  HYDRODIURIL  Take 1 tablet (12.5 mg total) by mouth daily.     IRON PO  Take 1 tablet by mouth daily.     isosorbide mononitrate 60 MG 24 hr tablet  Commonly known as:  IMDUR  Take 2 tablets (120 mg total) by mouth 2 (two) times daily.     KLOR-CON 10 10 MEQ tablet  Generic drug:  potassium chloride  Take 10 mEq by mouth daily.     losartan 100 MG tablet  Commonly known as:  COZAAR  Take 100 mg by mouth daily.     MULTIVITAMIN ADULT PO  Take 1 tablet by mouth at bedtime. Reported on 10/19/2015     pantoprazole 40 MG tablet  Commonly known as:  PROTONIX  Take 1 tablet (40 mg total) by mouth daily. Reported on 10/19/2015     pravastatin 40 MG tablet  Commonly known as:  PRAVACHOL  Take 40 mg by mouth every evening.     vitamin C 1000 MG tablet  Take 1,000 mg by mouth 2 (two) times daily.     VITAMIN D PO  Take 1 capsule by mouth daily.           Follow-up Information    Follow up with Philis Fendt, MD. Schedule an appointment as soon as possible for a visit in 1 week.   Specialty:  Internal Medicine   Contact information:   [address] [place] [address] [phone]       Follow up with [facility], MD. Schedule an appointment as soon as  possible for a visit in 1 month.   Specialty:  Cardiology   Contact information:   [place] [address] [phone]       Signed: Birdie Riddle 01/14/2016, 7:42 AM

## 2016-01-14 NOTE — Progress Notes (Signed)
D/c instructions discussed with patient, and pt verbalized understanding.

## 2016-06-12 ENCOUNTER — Observation Stay (HOSPITAL_COMMUNITY): Payer: Medicare HMO

## 2016-06-12 ENCOUNTER — Inpatient Hospital Stay (HOSPITAL_COMMUNITY)
Admission: AD | Admit: 2016-06-12 | Discharge: 2016-06-19 | DRG: 988 | Disposition: A | Discharge status: Home / Self Care | Payer: Medicare HMO | Source: Ambulatory Visit | Attending: Cardiovascular Disease | Admitting: Cardiovascular Disease

## 2016-06-12 ENCOUNTER — Encounter (HOSPITAL_COMMUNITY): Payer: Self-pay | Admitting: General Practice

## 2016-06-12 DIAGNOSIS — E440 Moderate protein-calorie malnutrition: Secondary | ICD-10-CM | POA: Diagnosis present

## 2016-06-12 DIAGNOSIS — Z90710 Acquired absence of both cervix and uterus: Secondary | ICD-10-CM

## 2016-06-12 DIAGNOSIS — D649 Anemia, unspecified: Secondary | ICD-10-CM

## 2016-06-12 DIAGNOSIS — R0602 Shortness of breath: Secondary | ICD-10-CM | POA: Diagnosis present

## 2016-06-12 DIAGNOSIS — Z9841 Cataract extraction status, right eye: Secondary | ICD-10-CM

## 2016-06-12 DIAGNOSIS — Z955 Presence of coronary angioplasty implant and graft: Secondary | ICD-10-CM

## 2016-06-12 DIAGNOSIS — Z79899 Other long term (current) drug therapy: Secondary | ICD-10-CM

## 2016-06-12 DIAGNOSIS — C9000 Multiple myeloma not having achieved remission: Secondary | ICD-10-CM | POA: Diagnosis present

## 2016-06-12 DIAGNOSIS — E871 Hypo-osmolality and hyponatremia: Secondary | ICD-10-CM | POA: Diagnosis present

## 2016-06-12 DIAGNOSIS — Z9842 Cataract extraction status, left eye: Secondary | ICD-10-CM

## 2016-06-12 DIAGNOSIS — R112 Nausea with vomiting, unspecified: Secondary | ICD-10-CM | POA: Diagnosis not present

## 2016-06-12 DIAGNOSIS — I2510 Atherosclerotic heart disease of native coronary artery without angina pectoris: Secondary | ICD-10-CM | POA: Diagnosis present

## 2016-06-12 DIAGNOSIS — E8809 Other disorders of plasma-protein metabolism, not elsewhere classified: Secondary | ICD-10-CM | POA: Diagnosis present

## 2016-06-12 DIAGNOSIS — E785 Hyperlipidemia, unspecified: Secondary | ICD-10-CM | POA: Diagnosis present

## 2016-06-12 DIAGNOSIS — Z6828 Body mass index (BMI) 28.0-28.9, adult: Secondary | ICD-10-CM

## 2016-06-12 DIAGNOSIS — K219 Gastro-esophageal reflux disease without esophagitis: Secondary | ICD-10-CM | POA: Diagnosis present

## 2016-06-12 DIAGNOSIS — F329 Major depressive disorder, single episode, unspecified: Secondary | ICD-10-CM | POA: Diagnosis present

## 2016-06-12 DIAGNOSIS — I081 Rheumatic disorders of both mitral and tricuspid valves: Secondary | ICD-10-CM | POA: Diagnosis present

## 2016-06-12 DIAGNOSIS — D500 Iron deficiency anemia secondary to blood loss (chronic): Secondary | ICD-10-CM | POA: Diagnosis present

## 2016-06-12 DIAGNOSIS — R809 Proteinuria, unspecified: Secondary | ICD-10-CM | POA: Diagnosis present

## 2016-06-12 DIAGNOSIS — R001 Bradycardia, unspecified: Secondary | ICD-10-CM | POA: Diagnosis present

## 2016-06-12 DIAGNOSIS — Z882 Allergy status to sulfonamides status: Secondary | ICD-10-CM

## 2016-06-12 DIAGNOSIS — E876 Hypokalemia: Secondary | ICD-10-CM | POA: Diagnosis present

## 2016-06-12 DIAGNOSIS — I110 Hypertensive heart disease with heart failure: Principal | ICD-10-CM | POA: Diagnosis present

## 2016-06-12 DIAGNOSIS — N179 Acute kidney failure, unspecified: Secondary | ICD-10-CM | POA: Diagnosis present

## 2016-06-12 DIAGNOSIS — R269 Unspecified abnormalities of gait and mobility: Secondary | ICD-10-CM | POA: Diagnosis present

## 2016-06-12 DIAGNOSIS — I272 Other secondary pulmonary hypertension: Secondary | ICD-10-CM | POA: Diagnosis present

## 2016-06-12 DIAGNOSIS — F17210 Nicotine dependence, cigarettes, uncomplicated: Secondary | ICD-10-CM | POA: Diagnosis present

## 2016-06-12 DIAGNOSIS — I425 Other restrictive cardiomyopathy: Secondary | ICD-10-CM | POA: Diagnosis present

## 2016-06-12 DIAGNOSIS — R0609 Other forms of dyspnea: Secondary | ICD-10-CM

## 2016-06-12 DIAGNOSIS — I252 Old myocardial infarction: Secondary | ICD-10-CM

## 2016-06-12 DIAGNOSIS — Z961 Presence of intraocular lens: Secondary | ICD-10-CM | POA: Diagnosis present

## 2016-06-12 DIAGNOSIS — I5031 Acute diastolic (congestive) heart failure: Secondary | ICD-10-CM | POA: Diagnosis present

## 2016-06-12 HISTORY — DX: Personal history of other medical treatment: Z92.89

## 2016-06-12 LAB — URINE MICROSCOPIC-ADD ON

## 2016-06-12 LAB — URINALYSIS, ROUTINE W REFLEX MICROSCOPIC
BILIRUBIN URINE: NEGATIVE
Glucose, UA: NEGATIVE mg/dL
Ketones, ur: NEGATIVE mg/dL
Leukocytes, UA: NEGATIVE
NITRITE: NEGATIVE
PH: 5.5 (ref 5.0–8.0)
Protein, ur: 100 mg/dL — AB
SPECIFIC GRAVITY, URINE: 1.014 (ref 1.005–1.030)

## 2016-06-12 LAB — CBC WITH DIFFERENTIAL/PLATELET
BASOS PCT: 1 %
Basophils Absolute: 0.1 10*3/uL (ref 0.0–0.1)
EOS ABS: 0.1 10*3/uL (ref 0.0–0.7)
EOS PCT: 2 %
HCT: 22.1 % — ABNORMAL LOW (ref 36.0–46.0)
Hemoglobin: 7 g/dL — ABNORMAL LOW (ref 12.0–15.0)
LYMPHS ABS: 1.9 10*3/uL (ref 0.7–4.0)
Lymphocytes Relative: 32 %
MCH: 28.9 pg (ref 26.0–34.0)
MCHC: 31.7 g/dL (ref 30.0–36.0)
MCV: 91.3 fL (ref 78.0–100.0)
MONO ABS: 0.5 10*3/uL (ref 0.1–1.0)
MONOS PCT: 9 %
NEUTROS ABS: 3.4 10*3/uL (ref 1.7–7.7)
NEUTROS PCT: 56 %
PLATELETS: 190 10*3/uL (ref 150–400)
RBC: 2.42 MIL/uL — AB (ref 3.87–5.11)
RDW: 24.6 % — ABNORMAL HIGH (ref 11.5–15.5)
WBC: 6 10*3/uL (ref 4.0–10.5)
nRBC: 2 /100 WBC — ABNORMAL HIGH

## 2016-06-12 LAB — COMPREHENSIVE METABOLIC PANEL
ALT: 23 U/L (ref 14–54)
AST: 34 U/L (ref 15–41)
Albumin: 2.2 g/dL — ABNORMAL LOW (ref 3.5–5.0)
Alkaline Phosphatase: 66 U/L (ref 38–126)
Anion gap: 3 — ABNORMAL LOW (ref 5–15)
BUN: 9 mg/dL (ref 6–20)
CHLORIDE: 112 mmol/L — AB (ref 101–111)
CO2: 17 mmol/L — AB (ref 22–32)
CREATININE: 1.12 mg/dL — AB (ref 0.44–1.00)
Calcium: 8.5 mg/dL — ABNORMAL LOW (ref 8.9–10.3)
GFR, EST AFRICAN AMERICAN: 57 mL/min — AB (ref >60)
GFR, EST NON AFRICAN AMERICAN: 49 mL/min — AB (ref >60)
Glucose, Bld: 94 mg/dL (ref 65–99)
POTASSIUM: 3.3 mmol/L — AB (ref 3.5–5.1)
SODIUM: 132 mmol/L — AB (ref 135–145)
Total Bilirubin: 0.5 mg/dL (ref 0.3–1.2)
Total Protein: >12 g/dL — ABNORMAL HIGH (ref 6.5–8.1)

## 2016-06-12 LAB — BRAIN NATRIURETIC PEPTIDE: B Natriuretic Peptide: 679.5 pg/mL — ABNORMAL HIGH (ref 0.0–100.0)

## 2016-06-12 LAB — TROPONIN I
TROPONIN I: 1.36 ng/mL — AB (ref <0.03)
Troponin I: 1.56 ng/mL (ref <0.03)

## 2016-06-12 MED ORDER — SODIUM CHLORIDE 0.9% FLUSH
3.0000 mL | Freq: Two times a day (BID) | INTRAVENOUS | Status: DC
  Administered 2016-06-13 – 2016-06-19 (×9): 3 mL via INTRAVENOUS

## 2016-06-12 MED ORDER — FUROSEMIDE 10 MG/ML IJ SOLN
40.0000 mg | Freq: Two times a day (BID) | INTRAMUSCULAR | Status: DC
  Administered 2016-06-12 – 2016-06-14 (×4): 40 mg via INTRAVENOUS
  Filled 2016-06-12 (×4): qty 4

## 2016-06-12 MED ORDER — AMLODIPINE BESYLATE 5 MG PO TABS
5.0000 mg | ORAL_TABLET | Freq: Every day | ORAL | Status: DC
  Administered 2016-06-12 – 2016-06-16 (×5): 5 mg via ORAL
  Filled 2016-06-12 (×5): qty 1

## 2016-06-12 MED ORDER — POTASSIUM CHLORIDE ER 10 MEQ PO TBCR
20.0000 meq | EXTENDED_RELEASE_TABLET | Freq: Three times a day (TID) | ORAL | Status: DC
  Administered 2016-06-12 – 2016-06-14 (×5): 20 meq via ORAL
  Filled 2016-06-12 (×11): qty 2

## 2016-06-12 MED ORDER — SODIUM CHLORIDE 0.9% FLUSH: 3.0000 mL | Freq: Two times a day (BID) | INTRAVENOUS | Status: DC

## 2016-06-12 MED ORDER — FERROUS SULFATE 325 (65 FE) MG PO TABS
325.0000 mg | ORAL_TABLET | Freq: Two times a day (BID) | ORAL | Status: DC
  Administered 2016-06-12 – 2016-06-14 (×4): 325 mg via ORAL
  Filled 2016-06-12 (×5): qty 1

## 2016-06-12 MED ORDER — SODIUM CHLORIDE 0.9% FLUSH: 3.0000 mL | INTRAVENOUS | Status: DC | PRN

## 2016-06-12 MED ORDER — LOSARTAN POTASSIUM 50 MG PO TABS
100.0000 mg | ORAL_TABLET | Freq: Every day | ORAL | Status: DC
  Administered 2016-06-13 – 2016-06-16 (×4): 100 mg via ORAL
  Filled 2016-06-12 (×4): qty 2

## 2016-06-12 MED ORDER — PRAVASTATIN SODIUM 40 MG PO TABS
40.0000 mg | ORAL_TABLET | Freq: Every evening | ORAL | Status: DC
  Administered 2016-06-12 – 2016-06-19 (×8): 40 mg via ORAL
  Filled 2016-06-12 (×7): qty 1

## 2016-06-12 MED ORDER — PANTOPRAZOLE SODIUM 40 MG PO TBEC
40.0000 mg | DELAYED_RELEASE_TABLET | Freq: Every day | ORAL | Status: DC
  Administered 2016-06-13 – 2016-06-19 (×7): 40 mg via ORAL
  Filled 2016-06-12 (×7): qty 1

## 2016-06-12 MED ORDER — FLUOXETINE HCL 10 MG PO CAPS
10.0000 mg | ORAL_CAPSULE | Freq: Every day | ORAL | Status: DC
  Administered 2016-06-12 – 2016-06-18 (×6): 10 mg via ORAL
  Filled 2016-06-12 (×8): qty 1

## 2016-06-12 MED ORDER — ENSURE ENLIVE PO LIQD
237.0000 mL | Freq: Two times a day (BID) | ORAL | Status: DC
  Administered 2016-06-13: 237 mL via ORAL

## 2016-06-12 MED ORDER — AMLODIPINE BESYLATE 10 MG PO TABS: 10.0000 mg | ORAL_TABLET | Freq: Every day | ORAL | Status: DC

## 2016-06-12 MED ORDER — ISOSORBIDE MONONITRATE ER 60 MG PO TB24
120.0000 mg | ORAL_TABLET | Freq: Two times a day (BID) | ORAL | Status: DC
  Administered 2016-06-12 – 2016-06-16 (×9): 120 mg via ORAL
  Filled 2016-06-12 (×9): qty 2

## 2016-06-12 MED ORDER — POTASSIUM CHLORIDE ER 10 MEQ PO TBCR
10.0000 meq | EXTENDED_RELEASE_TABLET | Freq: Every day | ORAL | Status: DC
  Filled 2016-06-12: qty 1

## 2016-06-12 MED ORDER — SODIUM CHLORIDE 0.9 % IV SOLN: 250.0000 mL | INTRAVENOUS | Status: DC | PRN

## 2016-06-12 MED ORDER — DOCUSATE SODIUM 100 MG PO CAPS: 100.0000 mg | ORAL_CAPSULE | Freq: Every day | ORAL | Status: DC

## 2016-06-12 MED ORDER — VITAMIN C 500 MG PO TABS
1000.0000 mg | ORAL_TABLET | Freq: Two times a day (BID) | ORAL | Status: DC
  Administered 2016-06-12 – 2016-06-19 (×11): 1000 mg via ORAL
  Filled 2016-06-12 (×12): qty 2

## 2016-06-12 MED ORDER — HYDROCHLOROTHIAZIDE 25 MG PO TABS
12.5000 mg | ORAL_TABLET | Freq: Every day | ORAL | Status: DC
  Administered 2016-06-13 – 2016-06-14 (×2): 12.5 mg via ORAL
  Filled 2016-06-12 (×2): qty 1

## 2016-06-12 NOTE — Progress Notes (Signed)
Pt heart rate in went to 49-50's.  Asymptomatic.  Notified Dr. Donnella Bi.  No new orders given.  Will continue to monitor.  Karie Kirks, Therapist, sports.

## 2016-06-12 NOTE — Progress Notes (Signed)
Pt arrived to floor at 1432.  Oriented to her room.  Call bell at reach.  Instructed to call for assistance.  Verbalized understanding.  Will continue to monitor.  Karie Kirks, Therapist, sports.

## 2016-06-12 NOTE — Care Management Obs Status (Signed)
[place] NOTIFICATION   Patient Details  Name: Abigail [name] MRN: [id number] Date of Birth: Dec 07, 1945   Medicare Observation Status Notification Given:  Yes    Apolonio Schneiders, RN 06/12/2016, 7:11 PM

## 2016-06-12 NOTE — Progress Notes (Signed)
Notified Dr. Doylene Canard of pt's arrival to 3E12.  No new orders given.  Karie Kirks, Therapist, sports.

## 2016-06-12 NOTE — H&P (Signed)
Referring Physician:  EMILYGRACE GROTHE is an 70 y.o. female.                       Chief Complaint: Shortness of breath  HPI: 29 year ld female with past history of chronic anemia, hypertension, GERD, CAD, Bilateral Renal artery stents and Hyperlipidemia, has 1 week history of shortness of breath with exertion. Her hemoglobin is down to 7 g/dL with normal WBC and platelets count. Her albumin level is low and total protein level is high. Her endoscopy in January 2017 showed duodenitis, gastritis and Schatzki's ring and Colonoscopy in December 2016 showed 2 polyps and moderate diverticulosis. Chest x-ray showed mild cardiomegaly and pulmonary edema.  Past Medical History:  Diagnosis Date  . Anemia 07/2015  . Bilateral renal artery stenosis ([place]) 1999   s/p stenting  . Coronary artery disease 1999  . Depression   . Diverticulosis   . Duodenitis   . Gastric erosions   . Gastric ulcer   . GERD (gastroesophageal reflux disease)   . Heart murmur   . History of blood transfusion ~ 03/2016   "low HgB; practically nonexistent"  . Hyperlipidemia   . Hypertension   . MI (myocardial infarction) ([place]) 1999  . Retinal hemorrhage, right eye   . Schatzki's ring   . Tubular adenoma of colon       Past Surgical History:  Procedure Laterality Date  . APPENDECTOMY    . BACK SURGERY    . CATARACT EXTRACTION W/ INTRAOCULAR LENS IMPLANT Right 2014  . CORONARY ANGIOPLASTY WITH STENT PLACEMENT  1999  . ESOPHAGOGASTRODUODENOSCOPY N/A 11/03/2015   Procedure: ESOPHAGOGASTRODUODENOSCOPY (EGD);  Surgeon: Carol Ada, MD;  Location: [facility] ENDOSCOPY;  Service: Endoscopy;  Laterality: N/A;  . LUMBAR [place]    . RENAL ARTERY STENT  1999   bil RAS so ? bil vs unilateral stents  . RETINAL LASER PROCEDURE Right 2012   "bleeding"  . TONSILLECTOMY    . TOTAL ABDOMINAL HYSTERECTOMY      Family History  Problem Relation Age of Onset  . Colon cancer Neg Hx    Social History:  reports that she has been smoking  Cigarettes.  She has a 24.00 pack-year smoking history. She has never used smokeless tobacco. She reports that she does not drink alcohol or use drugs.  Allergies:  Allergies  Allergen Reactions  . Sulfa Antibiotics Rash    Medications Prior to Admission  Medication Sig Dispense Refill  . amLODipine (NORVASC) 10 MG tablet Take 5 mg by mouth at bedtime.   5  . Ascorbic Acid (VITAMIN C) 1000 MG tablet Take 1,000 mg by mouth daily.     . Cholecalciferol (VITAMIN D PO) Take 1 capsule by mouth daily.    Marland Kitchen FLUoxetine (PROZAC) 10 MG capsule Take 10 mg by mouth at bedtime.     . hydrochlorothiazide (HYDRODIURIL) 12.5 MG tablet Take 1 tablet (12.5 mg total) by mouth daily. 30 tablet 3  . IRON PO Take 1 tablet by mouth 2 (two) times daily.     . isosorbide mononitrate (IMDUR) 60 MG 24 hr tablet Take 2 tablets (120 mg total) by mouth 2 (two) times daily. 60 tablet 3  . KLOR-CON 10 10 MEQ tablet Take 10 mEq by mouth daily.  6  . losartan (COZAAR) 100 MG tablet Take 100 mg by mouth daily.  1  . Multiple Vitamins-Minerals (MULTIVITAMIN ADULT PO) Take 1 tablet by mouth daily. Reported on 10/19/2015    .  pantoprazole (PROTONIX) 40 MG tablet Take 1 tablet (40 mg total) by mouth daily. Reported on 10/19/2015 30 tablet 3  . pravastatin (PRAVACHOL) 40 MG tablet Take 40 mg by mouth every evening.  1  . docusate sodium (COLACE) 100 MG capsule Take 1 capsule (100 mg total) by mouth daily. (Patient not taking: Reported on 06/12/2016) 30 capsule 3    Results for orders placed or performed during the hospital encounter of 06/12/16 (from the past 48 hour(s))  Comprehensive metabolic panel     Status: Abnormal   Collection Time: 06/12/16  4:10 PM  Result Value Ref Range   Sodium 132 (L) 135 - 145 mmol/L   Potassium 3.3 (L) 3.5 - 5.1 mmol/L   Chloride 112 (H) 101 - 111 mmol/L   CO2 17 (L) 22 - 32 mmol/L   Glucose, Bld 94 65 - 99 mg/dL   BUN 9 6 - 20 mg/dL   Creatinine, Ser 1.12 (H) 0.44 - 1.00 mg/dL   Calcium  8.5 (L) 8.9 - 10.3 mg/dL   Total Protein >12.0 (H) 6.5 - 8.1 g/dL   Albumin 2.2 (L) 3.5 - 5.0 g/dL   AST 34 15 - 41 U/L   ALT 23 14 - 54 U/L   Alkaline Phosphatase 66 38 - 126 U/L   Total Bilirubin 0.5 0.3 - 1.2 mg/dL   GFR calc non Af Amer 49 (L) >60 mL/min   GFR calc Af Amer 57 (L) >60 mL/min    Comment: (NOTE) The eGFR has been calculated using the CKD EPI equation. This calculation has not been validated in all clinical situations. eGFR's persistently <60 mL/min signify possible Chronic Kidney Disease.    Anion gap 3 (L) 5 - 15  CBC WITH DIFFERENTIAL     Status: Abnormal   Collection Time: 06/12/16  4:10 PM  Result Value Ref Range   WBC 6.0 4.0 - 10.5 K/uL   RBC 2.42 (L) 3.87 - 5.11 MIL/uL   Hemoglobin 7.0 (L) 12.0 - 15.0 g/dL   HCT 22.1 (L) 36.0 - 46.0 %   MCV 91.3 78.0 - 100.0 fL   MCH 28.9 26.0 - 34.0 pg   MCHC 31.7 30.0 - 36.0 g/dL   RDW 24.6 (H) 11.5 - 15.5 %   Platelets 190 150 - 400 K/uL   Neutrophils Relative % 56 %   Lymphocytes Relative 32 %   Monocytes Relative 9 %   Eosinophils Relative 2 %   Basophils Relative 1 %   nRBC 2 (H) 0 /100 WBC   Neutro Abs 3.4 1.7 - 7.7 K/uL   Lymphs Abs 1.9 0.7 - 4.0 K/uL   Monocytes Absolute 0.5 0.1 - 1.0 K/uL   Eosinophils Absolute 0.1 0.0 - 0.7 K/uL   Basophils Absolute 0.1 0.0 - 0.1 K/uL   RBC Morphology RARE NRBCs     Comment: POLYCHROMASIA PRESENT ROULEAUX    WBC Morphology ATYPICAL LYMPHOCYTES     Comment: MILD LEFT SHIFT (1-5% METAS, OCC MYELO, OCC BANDS)  Troponin I     Status: Abnormal   Collection Time: 06/12/16  4:10 PM  Result Value Ref Range   Troponin I 1.36 (HH) <0.03 ng/mL    Comment: CRITICAL RESULT CALLED TO, READ BACK BY AND VERIFIED WITHStevphen Meuse RN [phone] GREEN R    X-ray Chest Pa And Lateral  Result Date: 06/12/2016 CLINICAL DATA:  Shortness of breath, dizziness, fatigue, chest tightness today. History of hypertension, coronary artery disease. EXAM: CHEST  2 VIEW COMPARISON:  Chest  radiograph November 01, 2015 FINDINGS: The cardiac silhouette is mildly enlarged, unchanged. Mildly calcified aortic knob. Similar pulmonary vascular congestion with mild interstitial prominence, no pleural effusion or focal consolidation. No pneumothorax. Soft tissue planes and included osseous structure nonsuspicious. Calcification at RIGHT humeral head associated with calcific tendinitis. IMPRESSION: Mild cardiomegaly, interstitial prominence compatible with pulmonary edema. No focal consolidation. Electronically Signed   By: Elon Alas M.D.   On: 06/12/2016 18:00    Review Of Systems Constitutional: Negative for fever, chills or weight loss. HENT: Negative for hearing loss and tinnitus. Eyes: Positive vision change, wears glasses. No double vision or photophobia. Respiratory: No cough or hemoptysis. Positive smoking and COPD/Asthma. Cardiovascular: Positive chest pain and leg edema. Negative palpitations. Gastrointestinal: Positive nausea, vomiting and heartburn. Negative abdominal pain. Musculoskeletal: Positive arthritis and back pain.  Neurological : No headache, dizziness, stroke or seizures. Skin: No rash or itching.   Blood pressure (!) 151/74, pulse (!) 56, temperature 98.7 F (37.1 C), temperature source Oral, resp. rate 18, height 5' 5.5" (1.664 m), weight 79.3 kg (174 lb 12.8 oz), SpO2 93 %. General appearance: alert, cooperative, appears stated age and in mild distress. Head: Normocephalic, without obvious abnormality, atraumatic. Eyes: conjunctivae pale, corneas clear. PERRL, EOM's intact.  Neck: no adenopathy, no carotid bruit, no JVD, supple, symmetrical, trachea midline and thyroid not enlarged. Resp: clear to auscultation bilaterally. Cardio: regular rate and rhythm, S1, S2 normal, II/VI systolic murmur, no click, rub or gallop. GI: soft, non-tender; bowel sounds normal; no masses,  no organomegaly Extremities: extremities normal, atraumatic, no cyanosis or  edema Skin: warm and dry. No rashes or lesions Neurologic: Alert and oriented X 3, normal strength and tone. Normal coordination and gait  Assessment/Plan Shortness of breath Symptomatic anemia CAD Hypertension Tobacco use disorder Hyperproteinemia Hypoalbuminemia H/O bilateral renal artery stenosis with stent placement  Place in observation. IV lasix. Serum protein electrophoresis.  Birdie Riddle, MD  06/12/2016, 8:51 PM

## 2016-06-13 ENCOUNTER — Observation Stay (HOSPITAL_COMMUNITY): Payer: Medicare HMO

## 2016-06-13 DIAGNOSIS — Z882 Allergy status to sulfonamides status: Secondary | ICD-10-CM | POA: Diagnosis not present

## 2016-06-13 DIAGNOSIS — Z79899 Other long term (current) drug therapy: Secondary | ICD-10-CM | POA: Diagnosis not present

## 2016-06-13 DIAGNOSIS — Z6828 Body mass index (BMI) 28.0-28.9, adult: Secondary | ICD-10-CM | POA: Diagnosis not present

## 2016-06-13 DIAGNOSIS — K219 Gastro-esophageal reflux disease without esophagitis: Secondary | ICD-10-CM | POA: Diagnosis present

## 2016-06-13 DIAGNOSIS — Z9842 Cataract extraction status, left eye: Secondary | ICD-10-CM | POA: Diagnosis not present

## 2016-06-13 DIAGNOSIS — D630 Anemia in neoplastic disease: Secondary | ICD-10-CM | POA: Diagnosis not present

## 2016-06-13 DIAGNOSIS — R0602 Shortness of breath: Secondary | ICD-10-CM | POA: Diagnosis present

## 2016-06-13 DIAGNOSIS — R0609 Other forms of dyspnea: Secondary | ICD-10-CM

## 2016-06-13 DIAGNOSIS — I5031 Acute diastolic (congestive) heart failure: Secondary | ICD-10-CM | POA: Diagnosis present

## 2016-06-13 DIAGNOSIS — I272 Other secondary pulmonary hypertension: Secondary | ICD-10-CM | POA: Diagnosis present

## 2016-06-13 DIAGNOSIS — I425 Other restrictive cardiomyopathy: Secondary | ICD-10-CM | POA: Diagnosis present

## 2016-06-13 DIAGNOSIS — R112 Nausea with vomiting, unspecified: Secondary | ICD-10-CM | POA: Diagnosis not present

## 2016-06-13 DIAGNOSIS — D649 Anemia, unspecified: Secondary | ICD-10-CM | POA: Diagnosis not present

## 2016-06-13 DIAGNOSIS — Z961 Presence of intraocular lens: Secondary | ICD-10-CM | POA: Diagnosis present

## 2016-06-13 DIAGNOSIS — Z9841 Cataract extraction status, right eye: Secondary | ICD-10-CM | POA: Diagnosis not present

## 2016-06-13 DIAGNOSIS — E871 Hypo-osmolality and hyponatremia: Secondary | ICD-10-CM | POA: Diagnosis present

## 2016-06-13 DIAGNOSIS — F329 Major depressive disorder, single episode, unspecified: Secondary | ICD-10-CM | POA: Diagnosis present

## 2016-06-13 DIAGNOSIS — E876 Hypokalemia: Secondary | ICD-10-CM | POA: Diagnosis present

## 2016-06-13 DIAGNOSIS — E8809 Other disorders of plasma-protein metabolism, not elsewhere classified: Secondary | ICD-10-CM | POA: Diagnosis present

## 2016-06-13 DIAGNOSIS — C9000 Multiple myeloma not having achieved remission: Secondary | ICD-10-CM | POA: Diagnosis present

## 2016-06-13 DIAGNOSIS — N179 Acute kidney failure, unspecified: Secondary | ICD-10-CM | POA: Diagnosis present

## 2016-06-13 DIAGNOSIS — I252 Old myocardial infarction: Secondary | ICD-10-CM | POA: Diagnosis not present

## 2016-06-13 DIAGNOSIS — I2510 Atherosclerotic heart disease of native coronary artery without angina pectoris: Secondary | ICD-10-CM | POA: Diagnosis present

## 2016-06-13 DIAGNOSIS — I110 Hypertensive heart disease with heart failure: Secondary | ICD-10-CM | POA: Diagnosis present

## 2016-06-13 DIAGNOSIS — Z955 Presence of coronary angioplasty implant and graft: Secondary | ICD-10-CM | POA: Diagnosis not present

## 2016-06-13 DIAGNOSIS — E440 Moderate protein-calorie malnutrition: Secondary | ICD-10-CM | POA: Diagnosis present

## 2016-06-13 DIAGNOSIS — F17210 Nicotine dependence, cigarettes, uncomplicated: Secondary | ICD-10-CM | POA: Diagnosis present

## 2016-06-13 DIAGNOSIS — I340 Nonrheumatic mitral (valve) insufficiency: Secondary | ICD-10-CM | POA: Diagnosis not present

## 2016-06-13 DIAGNOSIS — E785 Hyperlipidemia, unspecified: Secondary | ICD-10-CM | POA: Diagnosis present

## 2016-06-13 LAB — ECHOCARDIOGRAM COMPLETE
CHL CUP TV REG PEAK VELOCITY: 366 cm/s
E decel time: 317 msec
E/e' ratio: 22.19
FS: 36 % (ref 28–44)
HEIGHTINCHES: 65.5 in
IVS/LV PW RATIO, ED: 0.89
LA ID, A-P, ES: 43 mm
LA diam index: 2.31 cm/m2
LA vol A4C: 72.5 ml
LA vol index: 41.4 mL/m2
LA vol: 77 mL
LDCA: 2.01 cm2
LEFT ATRIUM END SYS DIAM: 43 mm
LV E/e'average: 22.19
LV PW d: 12.9 mm — AB (ref 0.6–1.1)
LV TDI E'LATERAL: 6.31
LV TDI E'MEDIAL: 4.13
LV e' LATERAL: 6.31 cm/s
LVEEMED: 22.19
LVOT diameter: 16 mm
MV Dec: 317
MV pk A vel: 74.7 m/s
MV pk E vel: 140 m/s
MVPG: 8 mmHg
RV LATERAL S' VELOCITY: 14.8 cm/s
RV sys press: 62 mmHg
TAPSE: 18.4 mm
TRMAXVEL: 366 cm/s
WEIGHTICAEL: 2747.2 [oz_av]

## 2016-06-13 LAB — BASIC METABOLIC PANEL
Anion gap: 4 — ABNORMAL LOW (ref 5–15)
BUN: 7 mg/dL (ref 6–20)
CALCIUM: 8.6 mg/dL — AB (ref 8.9–10.3)
CO2: 19 mmol/L — AB (ref 22–32)
CREATININE: 0.91 mg/dL (ref 0.44–1.00)
Chloride: 108 mmol/L (ref 101–111)
GFR calc non Af Amer: >60 mL/min (ref >60)
Glucose, Bld: 89 mg/dL (ref 65–99)
Potassium: 3.2 mmol/L — ABNORMAL LOW (ref 3.5–5.1)
SODIUM: 131 mmol/L — AB (ref 135–145)

## 2016-06-13 LAB — CBC
HCT: 21.9 % — ABNORMAL LOW (ref 36.0–46.0)
Hemoglobin: 6.6 g/dL — CL (ref 12.0–15.0)
MCH: 27.8 pg (ref 26.0–34.0)
MCHC: 30.1 g/dL (ref 30.0–36.0)
MCV: 92.4 fL (ref 78.0–100.0)
PLATELETS: 160 10*3/uL (ref 150–400)
RBC: 2.37 MIL/uL — AB (ref 3.87–5.11)
RDW: 24.4 % — ABNORMAL HIGH (ref 11.5–15.5)
WBC: 5.2 10*3/uL (ref 4.0–10.5)

## 2016-06-13 LAB — PATHOLOGIST SMEAR REVIEW

## 2016-06-13 LAB — PREPARE RBC (CROSSMATCH)

## 2016-06-13 LAB — TROPONIN I: Troponin I: 1.59 ng/mL (ref <0.03)

## 2016-06-13 MED ORDER — SODIUM CHLORIDE 0.9 % IV SOLN: Freq: Once | INTRAVENOUS | Status: DC

## 2016-06-13 MED ORDER — MAGNESIUM SULFATE IN D5W 1-5 GM/100ML-% IV SOLN
1.0000 g | Freq: Once | INTRAVENOUS | Status: AC
  Administered 2016-06-13: 1 g via INTRAVENOUS
  Filled 2016-06-13: qty 100

## 2016-06-13 MED ORDER — ADULT MULTIVITAMIN W/MINERALS CH
1.0000 | ORAL_TABLET | Freq: Every day | ORAL | Status: DC
  Administered 2016-06-13 – 2016-06-19 (×6): 1 via ORAL
  Filled 2016-06-13 (×6): qty 1

## 2016-06-13 NOTE — Progress Notes (Addendum)
Ref: Philis Fendt, MD   Subjective:  Breathing better but still short of breath with little activity. Aware of elevated protein levels and anemia with possibility of multiple myeloma. Labs are ordered. Oncology service to see her and IR to perform Bone marrow biopsy on Monday. Fair diuresis with persistent low hemoglobin level. Awaiting blood transfusion when available. Restricted cardiomyopathy and pulmonary hypertension on echocardiogram.  Objective:  Vital Signs in the last 24 hours: Temp:  [97.9 F (36.6 C)-98.9 F (37.2 C)] 97.9 F (36.6 C) (08/25 0804) Pulse Rate:  [56-77] 68 (08/25 0839) Cardiac Rhythm: Sinus bradycardia (08/25 0700) Resp:  [18] 18 (08/25 0839) BP: (138-158)/(66-76) 156/66 (08/25 0839) SpO2:  [93 %-100 %] 94 % (08/25 0839) Weight:  [77.9 kg (171 lb 11.2 oz)-79.3 kg (174 lb 12.8 oz)] 77.9 kg (171 lb 11.2 oz) (08/25 1324)  Physical Exam: BP Readings from Last 1 Encounters:  06/13/16 (!) 156/66    Wt Readings from Last 1 Encounters:  06/13/16 77.9 kg (171 lb 11.2 oz)    Weight change:   HEENT: [place]/AT, Eyes-Brown, PERL, EOMI, Conjunctiva-Pale, Sclera-Non-icteric Neck: No JVD, No bruit, Trachea midline. Lungs:  Clear, Bilateral. Cardiac:  Regular rhythm, normal S1 and S2, no S3. III/VI systolic murmur. Abdomen:  Soft, non-tender. Extremities:  No edema present. No cyanosis. No clubbing. CNS: AxOx3, Cranial nerves grossly intact, moves all 4 extremities. Right handed. Skin: Warm and dry.   Intake/Output from previous day: 08/24 0701 - 08/25 0700 In: 480 [P.O.:480] Out: 2050 [Urine:2050]    Lab Results: BMET    Component Value Date/Time   NA 131 (L) 06/13/2016 0342   NA 132 (L) 06/12/2016 1610   NA 133 (L) 01/13/2016 0327   K 3.2 (L) 06/13/2016 0342   K 3.3 (L) 06/12/2016 1610   K 3.5 01/13/2016 0327   CL 108 06/13/2016 0342   CL 112 (H) 06/12/2016 1610   CL 110 01/13/2016 0327   CO2 19 (L) 06/13/2016 0342   CO2 17 (L) 06/12/2016 1610   CO2  19 (L) 01/13/2016 0327   GLUCOSE 89 06/13/2016 0342   GLUCOSE 94 06/12/2016 1610   GLUCOSE 87 01/13/2016 0327   BUN 7 06/13/2016 0342   BUN 9 06/12/2016 1610   BUN 22 (H) 01/13/2016 0327   CREATININE 0.91 06/13/2016 0342   CREATININE 1.12 (H) 06/12/2016 1610   CREATININE 1.13 (H) 01/13/2016 0327   CALCIUM 8.6 (L) 06/13/2016 0342   CALCIUM 8.5 (L) 06/12/2016 1610   CALCIUM 8.6 (L) 01/13/2016 0327   GFRNONAA >60 06/13/2016 0342   GFRNONAA 49 (L) 06/12/2016 1610   GFRNONAA 48 (L) 01/13/2016 0327   GFRAA >60 06/13/2016 0342   GFRAA 57 (L) 06/12/2016 1610   GFRAA 56 (L) 01/13/2016 0327   CBC    Component Value Date/Time   WBC 5.2 06/13/2016 0342   RBC 2.37 (L) 06/13/2016 0342   HGB 6.6 (LL) 06/13/2016 0342   HCT 21.9 (L) 06/13/2016 0342   PLT 160 06/13/2016 0342   MCV 92.4 06/13/2016 0342   MCH 27.8 06/13/2016 0342   MCHC 30.1 06/13/2016 0342   RDW 24.4 (H) 06/13/2016 0342   LYMPHSABS 1.9 06/12/2016 1610   MONOABS 0.5 06/12/2016 1610   EOSABS 0.1 06/12/2016 1610   BASOSABS 0.1 06/12/2016 1610   HEPATIC Function Panel  Recent Labs  11/01/15 1830 01/11/16 1819 06/12/16 1610  PROT >12.0* >12.0* >12.0*   HEMOGLOBIN A1C No components found for: HGA1C,  MPG CARDIAC ENZYMES Lab Results  Component  Value Date   CKTOTAL 129 01/01/2012   CKMB 4.6 (H) 01/01/2012   TROPONINI 1.59 (HH) 06/13/2016   TROPONINI 1.56 (HH) 06/12/2016   TROPONINI 1.36 (HH) 06/12/2016   BNP No results for input(s): PROBNP in the last 8760 hours. TSH  Recent Labs  11/01/15 1830  TSH 0.467   CHOLESTEROL  Recent Labs  11/02/15 0630  CHOL 86    Scheduled Meds: . sodium chloride   Intravenous Once  . amLODipine  5 mg Oral Q2000  . ferrous sulfate  325 mg Oral BID  . FLUoxetine  10 mg Oral Q2000  . furosemide  40 mg Intravenous BID  . hydrochlorothiazide  12.5 mg Oral Daily  . isosorbide mononitrate  120 mg Oral BID  . losartan  100 mg Oral Daily  . multivitamin with minerals  1  tablet Oral Daily  . pantoprazole  40 mg Oral QAC breakfast  . potassium chloride  20 mEq Oral TID  . pravastatin  40 mg Oral QPM  . sodium chloride flush  3 mL Intravenous Q12H  . vitamin C  1,000 mg Oral BID   Continuous Infusions:  PRN Meds:.sodium chloride, sodium chloride flush  Assessment/Plan: Acute diastolic heart failure Shortness of breath Symptomatic anemia Possible multiple myeloma CAD Hypertension Tobacco use disorder Hypokalemia Hypoalbuminemia H/O bilateral renal artery stenosis Possible malnutrition  Continue IV lasix. Transfuse 2 units of Packed RBC. Await bone marrow biopsy. Hold nutritional supplement till heart failure improves.    LOS: 0 days    [name]  MD  06/13/2016, 2:08 PM

## 2016-06-13 NOTE — Plan of Care (Signed)
Problem: Pain Managment: Goal: General experience of comfort will improve Outcome: Progressing Patient able to express needs related to pain. Uses numerical scale and able to describe pain.   Problem: Physical Regulation: Goal: Will remain free from infection Outcome: Progressing Patient received education related to proper hand hygiene; verbalized understanding; demonstrated proper hand washing technique  Problem: Activity: Goal: Risk for activity intolerance will decrease Outcome: Progressing Patient up to bedside commode and is able to ambulate around the room without difficulty.

## 2016-06-13 NOTE — Progress Notes (Signed)
38  Cmt had reported pt had some runs  bigimeny . Pt [place] made aware .Will orders

## 2016-06-13 NOTE — Progress Notes (Signed)
PT Cancellation Note  Patient Details Name: Abigail [name] MRN: [id number] DOB: 08-07-1946   Cancelled Treatment:    Reason Eval/Treat Not Completed: Other (comment) (Just began receiving unit of blood)   [name] 06/13/2016, 2:40 PM [facility] PT [phone]

## 2016-06-13 NOTE — Progress Notes (Signed)
  Echocardiogram 2D Echocardiogram has been performed.  Johny Chess 06/13/2016, 10:33 AM

## 2016-06-13 NOTE — Progress Notes (Signed)
Lab reported a Hgb of 6.6 to this RN. Previous Hgb was 7.0 and MD was on the floor at change of shift. RN also observed K+ = 3.2 this AM. MD paged. Orders received for Type and Screen and also 2 units of pRBCs. Will continue to monitor.

## 2016-06-13 NOTE — Progress Notes (Signed)
[id number] Received a call from the lab ,pt develop antibodies so blood may be delayed availablity. To follow up

## 2016-06-13 NOTE — Progress Notes (Signed)
Initial Nutrition Assessment  DOCUMENTATION CODES:   Non-severe (moderate) malnutrition in context of chronic illness  INTERVENTION:  Provide Ensure Enlive po BID, each supplement provides 350 kcal and 20 grams of protein Multivitamin with minerals daily Encourage PO intake   NUTRITION DIAGNOSIS:   Malnutrition related to poor appetite, chronic illness as evidenced by moderate depletions of muscle mass, percent weight loss, energy intake < 75% for > or equal to 1 month.   GOAL:   Patient will meet greater than or equal to 90% of their needs   MONITOR:   PO intake, Weight trends, Labs, Skin, I & O's  REASON FOR ASSESSMENT:   Malnutrition Screening Tool    ASSESSMENT:   66 year ld female with past history of chronic anemia, hypertension, GERD, CAD, Bilateral Renal artery stents and Hyperlipidemia, has 1 week history of shortness of breath with exertion. Her hemoglobin is down to 7 g/dL with normal WBC and platelets count. Her albumin level is low and total protein level is high. Her endoscopy in January 2017 showed duodenitis, gastritis and Schatzki's ring  Pt reports that since having a blood transfusion back in December, she has had a poor appetite and has been losing weight. She denies any nausea, abdominal pain, constipation, diarrhea or any other symptoms contributing to her poor appetite. She states that she used to weight 210-215 lbs. Weight history shows that patient has lost 10% of her body weight within the past 5 months and 17% in the past 8 months. She usually eats a couple eggs or a sausage for breakfast, grapes for a snack, and a sandwich for dinner; she will also occasional drink a bottle of Ensure. Pt has moderate muscle wasting per nutrition focused physical exam. [facility] but she has felt too weak the past couple months to continue. She reports eating 75% of breakfast this morning.  RD encouraged pt to increase  frequency of meals and snacks as well as increase Ensure intake to twice daily. Emphasized the importance of nutrition and a general healthful diet.   Labs: low potassium, low sodium, low calcium, low hemoglobin  Diet Order:  Diet Heart Room service appropriate? Yes; Fluid consistency: Thin; Fluid restriction: 1500 mL Fluid  Skin:  Reviewed, no issues  Last BM:  8/24  Height:   Ht Readings from Last 1 Encounters:  06/12/16 5' 5.5" (1.664 m)    Weight:   Wt Readings from Last 1 Encounters:  06/13/16 171 lb 11.2 oz (77.9 kg)    Ideal Body Weight:  57.9 kg  BMI:  Body mass index is 28.14 kg/m.  Estimated Nutritional Needs:   Kcal:  1700-1900  Protein:  80-90 grams  Fluid:  1.7-1.9 L/day  EDUCATION NEEDS:   No education needs identified at this time  Scarlette Ar RD, LDN, CSP Inpatient Clinical Dietitian Pager: [phone] After Hours Pager: [phone]

## 2016-06-13 NOTE — Consult Note (Signed)
Chief Complaint: Patient was seen in consultation today for chronic anemia; poss Multiple Myeloma at the request of Dr [name]  Referring Physician(s) Dr [name]  Supervising Physician: Arne Cleveland  Patient Status: Inpatient  History of Present Illness: Abigail [name] is a 70 y.o. female   Chronic anemia Shortness of breath Protein elevated Possible multiple myeloma Request for bone marrow biopsy per Dr Cyndie Chime    Past Medical History:  Diagnosis Date  . Anemia 07/2015  . Bilateral renal artery stenosis ([place]) 1999   s/p stenting  . Coronary artery disease 1999  . Depression   . Diverticulosis   . Duodenitis   . Gastric erosions   . Gastric ulcer   . GERD (gastroesophageal reflux disease)   . Heart murmur   . History of blood transfusion ~ 03/2016   "low HgB; practically nonexistent"  . Hyperlipidemia   . Hypertension   . MI (myocardial infarction) ([place]) 1999  . Retinal hemorrhage, right eye   . Schatzki's ring   . Tubular adenoma of colon     Past Surgical History:  Procedure Laterality Date  . APPENDECTOMY    . BACK SURGERY    . CATARACT EXTRACTION W/ INTRAOCULAR LENS IMPLANT Right 2014  . CORONARY ANGIOPLASTY WITH STENT PLACEMENT  1999  . ESOPHAGOGASTRODUODENOSCOPY N/A 11/03/2015   Procedure: ESOPHAGOGASTRODUODENOSCOPY (EGD);  Surgeon: Carol Ada, MD;  Location[facility] ENDOSCOPY;  Service: Endoscopy;  Laterality: N/A;  . LUMBAR [place]    . RENAL ARTERY STENT  1999   bil RAS so ? bil vs unilateral stents  . RETINAL LASER PROCEDURE Right 2012   "bleeding"  . TONSILLECTOMY    . TOTAL ABDOMINAL HYSTERECTOMY      Allergies: Sulfa antibiotics  Medications: Prior to Admission medications   Medication Sig Start Date End Date Taking? Authorizing Provider  amLODipine (NORVASC) 10 MG tablet Take 5 mg by mouth at bedtime.  01/03/16  Yes Historical Provider, MD  Ascorbic Acid (VITAMIN C) 1000 MG tablet Take 1,000 mg by mouth daily.    Yes  Historical Provider, MD  Cholecalciferol (VITAMIN D PO) Take 1 capsule by mouth daily.   Yes Historical Provider, MD  FLUoxetine (PROZAC) 10 MG capsule Take 10 mg by mouth at bedtime.    Yes Historical Provider, MD  hydrochlorothiazide (HYDRODIURIL) 12.5 MG tablet Take 1 tablet (12.5 mg total) by mouth daily. 01/14/16  Yes [name], MD  IRON PO Take 1 tablet by mouth 2 (two) times daily.    Yes Historical Provider, MD  isosorbide mononitrate (IMDUR) 60 MG 24 hr tablet Take 2 tablets (120 mg total) by mouth 2 (two) times daily. 11/04/15  Yes [name], MD  KLOR-CON 10 10 MEQ tablet Take 10 mEq by mouth daily. 05/15/15  Yes Historical Provider, MD  losartan (COZAAR) 100 MG tablet Take 100 mg by mouth daily. 07/13/15  Yes Historical Provider, MD  Multiple Vitamins-Minerals (MULTIVITAMIN ADULT PO) Take 1 tablet by mouth daily. Reported on 10/19/2015   Yes Historical Provider, MD  pantoprazole (PROTONIX) 40 MG tablet Take 1 tablet (40 mg total) by mouth daily. Reported on 10/19/2015 01/14/16  Yes [name], MD  pravastatin (PRAVACHOL) 40 MG tablet Take 40 mg by mouth every evening. 06/03/15  Yes Historical Provider, MD  docusate sodium (COLACE) 100 MG capsule Take 1 capsule (100 mg total) by mouth daily. Patient not taking: Reported on 06/12/2016 01/14/16   [name], MD     Family History  Problem  Relation Age of Onset  . Colon cancer Neg Hx     Social History   Social History  . Marital status: Single    Spouse name: N/A  . Number of children: N/A  . Years of education: N/A   Social History Main Topics  . Smoking status: Current Every Day Smoker    Packs/day: 0.50    Years: 48.00    Types: Cigarettes  . Smokeless tobacco: Never Used  . Alcohol use No  . Drug use: No  . Sexual activity: No   Other Topics Concern  . None   Social History Narrative  . None     Review of Systems: A 12 point ROS discussed and pertinent positives are indicated in the HPI above.  All other  systems are negative.  Review of Systems  Constitutional: Positive for activity change and fatigue. Negative for appetite change, fever and unexpected weight change.  Respiratory: Positive for shortness of breath.   Neurological: Positive for weakness.  Psychiatric/Behavioral: Negative for behavioral problems and confusion.    Vital Signs: BP (!) 156/66 (BP Location: Left Arm)   Pulse 68   Temp 97.9 F (36.6 C) (Oral)   Resp 18   Ht 5' 5.5" (1.664 m)   Wt 171 lb 11.2 oz (77.9 kg)   SpO2 94%   BMI 28.14 kg/m   Physical Exam  Constitutional: She is oriented to person, place, and time. She appears well-nourished.  Cardiovascular: Normal rate and regular rhythm.   Pulmonary/Chest: Effort normal and breath sounds normal.  Abdominal: Soft. Bowel sounds are normal.  Musculoskeletal: Normal range of motion.  Neurological: She is alert and oriented to person, place, and time.  Skin: Skin is warm and dry.  Psychiatric: She has a normal mood and affect. Her behavior is normal. Judgment and thought content normal.    Mallampati Score:  MD Evaluation Airway: WNL Heart: WNL Abdomen: WNL Chest/ Lungs: WNL ASA  Classification: 2 Mallampati/Airway Score: Two  Imaging: X-ray Chest Pa And Lateral  Result Date: 06/12/2016 CLINICAL DATA:  Shortness of breath, dizziness, fatigue, chest tightness today. History of hypertension, coronary artery disease. EXAM: CHEST  2 VIEW COMPARISON:  Chest radiograph November 01, 2015 FINDINGS: The cardiac silhouette is mildly enlarged, unchanged. Mildly calcified aortic knob. Similar pulmonary vascular congestion with mild interstitial prominence, no pleural effusion or focal consolidation. No pneumothorax. Soft tissue planes and included osseous structure nonsuspicious. Calcification at RIGHT humeral head associated with calcific tendinitis. IMPRESSION: Mild cardiomegaly, interstitial prominence compatible with pulmonary edema. No focal consolidation.  Electronically Signed   By: Elon Alas M.D.   On: 06/12/2016 18:00    Labs:  CBC:  Recent Labs  01/13/16 0327 01/13/16 1926 01/14/16 0418 06/12/16 1610 06/13/16 0342  WBC 6.4  --  5.6 6.0 5.2  HGB 8.1* 9.9* 9.1* 7.0* 6.6*  HCT 25.9* 31.1* 28.8* 22.1* 21.9*  PLT 144*  --  146* 190 160    COAGS:  Recent Labs  11/02/15 0801  INR 1.26    BMP:  Recent Labs  01/12/16 0626 01/13/16 0327 06/12/16 1610 06/13/16 0342  NA 133* 133* 132* 131*  K 3.8 3.5 3.3* 3.2*  CL 112* 110 112* 108  CO2 17* 19* 17* 19*  GLUCOSE 94 87 94 89  BUN 15 22* 9 7  CALCIUM 8.7* 8.6* 8.5* 8.6*  CREATININE 0.97 1.13* 1.12* 0.91  GFRNONAA 58* 48* 49* >60  GFRAA >60 56* 57* >60    LIVER FUNCTION TESTS:  Recent  Labs  11/01/15 1830 01/11/16 1819 06/12/16 1610  BILITOT 0.4 0.6 0.5  AST 20 26 34  ALT 13* 17 23  ALKPHOS 72 50 66  PROT >12.0* >12.0* >12.0*  ALBUMIN 2.5* 2.4* 2.2*    TUMOR MARKERS: No results for input(s): AFPTM, CEA, CA199, CHROMGRNA in the last 8760 hours.  Assessment and Plan:  Chronic anemia; SOB Elevated protein Worrisome for Multiple Myeloma per Dr Doylene Canard Scheduled for Bone marrow bx 8/28 Risks and Benefits discussed with the patient including, but not limited to bleeding, infection, damage to adjacent structures or low yield requiring additional tests. All of the patient's questions were answered, patient is agreeable to proceed. Consent signed and in chart.   Thank you for this interesting consult.  I greatly enjoyed meeting Abigail [name] and look forward to participating in their care.  A copy of this report was sent to the requesting provider on this date.  Electronically Signed: Monia Sabal A 06/13/2016, 1:27 PM   I spent a total of 20 minutes    in face to face in clinical consultation, greater than 50% of which was counseling/coordinating care for bone marrow biopsy

## 2016-06-13 NOTE — Consult Note (Signed)
New Hematology/Oncology Consult   Referral MD: [name]       Reason for Referral: Anemia    HPI: Abigail [name] was admitted 06/12/2016 with exertional dyspnea. She has a history of anemia dating to October 2016. She has received multiple red cell transfusions this year. She reports feeling better for several weeks after each red cell transfusion.  On 06/12/2016 the hemoglobin returned at 7, MCV 91.3, platelets 190,000, white count 6.0, and the absolute neutral count returned at 3.4. An upper endoscopy 11/03/2015 revealed gastric erosions and healing ulcerations. A colonoscopy on 10/19/2015 revealed polyps and the pathology returned as tubular adenomas.   Past Medical History:  Diagnosis Date  . Anemia 07/2015  . Bilateral renal artery stenosis ([place]) 1999   s/p stenting  . Coronary artery disease 1999  . Depression   . Diverticulosis   . Duodenitis, Gastric erosions, and healing gastric ulcers on endoscopy  January 2017   . G1 P1    .    Marland Kitchen GERD (gastroesophageal reflux disease)   . Heart murmur   . History of blood transfusion ~ 03/2016   "low HgB; practically nonexistent"  . Hyperlipidemia   . Hypertension   . MI (myocardial infarction) ([place]) 1999  . Retinal hemorrhage, right eye   . Schatzki's ring   . Tubular adenomas of colon December 2016   :  Past Surgical History:  Procedure Laterality Date  . APPENDECTOMY    . BACK SURGERY    . CATARACT EXTRACTION W/ INTRAOCULAR LENS IMPLANT Right 2014  . CORONARY ANGIOPLASTY WITH STENT PLACEMENT  1999  . ESOPHAGOGASTRODUODENOSCOPY N/A 11/03/2015   Procedure: ESOPHAGOGASTRODUODENOSCOPY (EGD);  Surgeon: Carol Ada, MD;  [facility] END[facility]COPY;  Service: Endoscopy;  Laterality: N/A;  . LUMBAR [place]    . RENAL ARTERY STENT  1999   bil RAS so ? bil vs unilateral stents  . RETINAL LASER PROCEDURE Right 2012   "bleeding"  . TONSILLECTOMY    . TOTAL ABDOMINAL HYSTERECTOMY    :   Current Facility-Administered  Medications:  .  0.9 %  sodium chloride infusion, 250 mL, Intravenous, PRN, [name], MD .  0.9 %  sodium chloride infusion, , Intravenous, Once, [name], MD .  amLODipine (NORVASC) tablet 5 mg, 5 mg, Oral, Q2000, [name], MD, 5 mg at 06/12/16 2108 .  ferrous sulfate tablet 325 mg, 325 mg, Oral, BID, [name], MD, 325 mg at 06/13/16 1055 .  FLUoxetine (PROZAC) capsule 10 mg, 10 mg, Oral, Q2000, [name], MD, 10 mg at 06/12/16 2109 .  furosemide (LASIX) injection 40 mg, 40 mg, Intravenous, BID, [name], MD, 40 mg at 06/13/16 0833 .  hydrochlorothiazide (HYDRODIURIL) tablet 12.5 mg, 12.5 mg, Oral, Daily, [name], MD, 12.5 mg at 06/13/16 1055 .  isosorbide mononitrate (IMDUR) 24 hr tablet 120 mg, 120 mg, Oral, BID, [name], MD, 120 mg at 06/13/16 1055 .  losartan (COZAAR) tablet 100 mg, 100 mg, Oral, Daily, [name], MD, 100 mg at 06/13/16 1055 .  multivitamin with minerals tablet 1 tablet, 1 tablet, Oral, Daily, [name], MD .  pantoprazole (PROTONIX) EC tablet 40 mg, 40 mg, Oral, QAC breakfast, [name], MD, 40 mg at 06/13/16 0534 .  potassium chloride (K-DUR) CR tablet 20 mEq, 20 mEq, Oral, TID, [name], MD, 20 mEq at 06/13/16 1055 .  pravastatin (PRAVACHOL) tablet 40 mg, 40 mg, Oral, QPM, [name], MD, 40 mg at 06/12/16 2122 .  sodium chloride flush (NS)  0.9 % injection 3 mL, 3 mL, Intravenous, Q12H, [name], MD, 3 mL at 06/13/16 1000 .  sodium chloride flush (NS) 0.9 % injection 3 mL, 3 mL, Intravenous, PRN, [name], MD .  vitamin C (ASCORBIC ACID) tablet 1,000 mg, 1,000 mg, Oral, BID, [name], MD, 1,000 mg at 06/13/16 1055:  . sodium chloride   Intravenous Once  . amLODipine  5 mg Oral Q2000  . ferrous sulfate  325 mg Oral BID  . FLUoxetine  10 mg Oral Q2000  . furosemide  40 mg Intravenous BID  . hydrochlorothiazide  12.5 mg Oral Daily  . isosorbide mononitrate  120 mg Oral BID  . losartan  100 mg Oral Daily  .  multivitamin with minerals  1 tablet Oral Daily  . pantoprazole  40 mg Oral QAC breakfast  . potassium chloride  20 mEq Oral TID  . pravastatin  40 mg Oral QPM  . sodium chloride flush  3 mL Intravenous Q12H  . vitamin C  1,000 mg Oral BID  :  Allergies  Allergen Reactions  . Sulfa Antibiotics Rash  :  Family History  Problem Relation Age of Onset  . Colon cancer Neg Hx     .   Her mother had breast cancer, her maternal grandmother had cervical cancer, a maternal aunt had breast cancer   Review of Systems:  Positives include: Exertional dyspnea  A complete ROS was otherwise negative.   Physical Exam:  Blood pressure (!) 144/62, pulse 65, temperature 98.6 F (37 C), temperature source Oral, resp. rate 18, height 5' 5.5" (1.664 m), weight 171 lb 11.2 oz (77.9 kg), SpO2 96 %.  HEENT: Oral cavity without visible mass, neck without mass Lungs: Rales at the posterior bases bilaterally, no respiratory distress Cardiac: Regular rate and rhythm Abdomen: No hepatosplenomegaly, nontender, no mass  Vascular: No leg edema Lymph nodes: No cervical, supraclavicular, axillary, or inguinal nodes Neurologic: Alert and oriented, the motor exam appears intact in the upper and lower extremities Skin: No rash Musculoskeletal: No spine tenderness  LABS:   Recent Labs  06/12/16 1610 06/13/16 0342  WBC 6.0 5.2  HGB 7.0* 6.6*  HCT 22.1* 21.9*  PLT 190 160   Peripheral blood smear on 06/12/2016: The platelets appear normal in number. No platelet clumps. The polychromasia is mildly increased. Marked variation in red cell size. Rare nucleated red cell. Rouleaux is present. A few atypical lymphocytes. No blasts. Rare myelocyte.   Recent Labs  06/12/16 1610 06/13/16 0342  NA 132* 131*  K 3.3* 3.2*  CL 112* 108  CO2 17* 19*  GLUCOSE 94 89  BUN 9 7  CREATININE 1.12* 0.91  CALCIUM 8.5* 8.6*   06/12/2016 Total protein greater than 12, albumin 2.2   RADIOLOGY:  X-ray Chest Pa And  Lateral  Result Date: 06/12/2016 CLINICAL DATA:  Shortness of breath, dizziness, fatigue, chest tightness today. History of hypertension, coronary artery disease. EXAM: CHEST  2 VIEW COMPARISON:  Chest radiograph November 01, 2015 FINDINGS: The cardiac silhouette is mildly enlarged, unchanged. Mildly calcified aortic knob. Similar pulmonary vascular congestion with mild interstitial prominence, no pleural effusion or focal consolidation. No pneumothorax. Soft tissue planes and included osseous structure nonsuspicious. Calcification at RIGHT humeral head associated with calcific tendinitis. IMPRESSION: Mild cardiomegaly, interstitial prominence compatible with pulmonary edema. No focal consolidation. Electronically Signed   By: Elon Alas M.D.   On: 06/12/2016 18:00    Assessment and Plan:   1. Severe anemia 2. Markedly elevated  serum protein 3. History of coronary artery disease/myocardial infarction 4. History of colon polyps  Abigail [name] is admitted with severe symptomatic anemia. The serum protein is markedly elevated. I have a high clinical suspicion for a diagnosis of multiple myeloma. The differential diagnosis includes other lymphoproliferative disorders such as a low-grade lymphoma or Waldenstrm's macroglobulinemia.  Recommendations: 1. Transfuse packed red blood cells for symptomatic anemia 2.  Myeloma evaluation to include a serum protein electrophoresis/immunofixation, quantitative immunoglobulin levels, and serum free light chain analysis 3.  Metastatic bone survey 4.  Diagnostic bone marrow aspirate and biopsy  I will be out until 06/18/2016. Please call Oncology as needed. I will ask Dr. Marin Olp to check on her. We will schedule outpatient follow-up  [name], MD 06/13/2016, 3:10 PM

## 2016-06-14 DIAGNOSIS — E440 Moderate protein-calorie malnutrition: Secondary | ICD-10-CM | POA: Insufficient documentation

## 2016-06-14 LAB — BASIC METABOLIC PANEL
ANION GAP: 4 — AB (ref 5–15)
BUN: 8 mg/dL (ref 6–20)
CO2: 23 mmol/L (ref 22–32)
Calcium: 9 mg/dL (ref 8.9–10.3)
Chloride: 106 mmol/L (ref 101–111)
Creatinine, Ser: 0.97 mg/dL (ref 0.44–1.00)
GFR calc Af Amer: >60 mL/min (ref >60)
GFR, EST NON AFRICAN AMERICAN: 58 mL/min — AB (ref >60)
GLUCOSE: 88 mg/dL (ref 65–99)
POTASSIUM: 3.4 mmol/L — AB (ref 3.5–5.1)
Sodium: 133 mmol/L — ABNORMAL LOW (ref 135–145)

## 2016-06-14 LAB — CBC
HEMATOCRIT: 29.4 % — AB (ref 36.0–46.0)
HEMOGLOBIN: 9.4 g/dL — AB (ref 12.0–15.0)
MCH: 29.4 pg (ref 26.0–34.0)
MCHC: 32 g/dL (ref 30.0–36.0)
MCV: 91.9 fL (ref 78.0–100.0)
Platelets: 189 10*3/uL (ref 150–400)
RBC: 3.2 MIL/uL — AB (ref 3.87–5.11)
RDW: 20.7 % — ABNORMAL HIGH (ref 11.5–15.5)
WBC: 5.5 10*3/uL (ref 4.0–10.5)

## 2016-06-14 LAB — MAGNESIUM: Magnesium: 1.7 mg/dL (ref 1.7–2.4)

## 2016-06-14 MED ORDER — POTASSIUM CHLORIDE IN NACL 40-0.9 MEQ/L-% IV SOLN
INTRAVENOUS | Status: DC
  Administered 2016-06-14: 50 mL/h via INTRAVENOUS
  Filled 2016-06-14: qty 1000

## 2016-06-14 MED ORDER — MORPHINE SULFATE (PF) 2 MG/ML IV SOLN: 2.0000 mg | Freq: Once | INTRAVENOUS | Status: AC

## 2016-06-14 MED ORDER — NITROGLYCERIN 0.4 MG SL SUBL
SUBLINGUAL_TABLET | SUBLINGUAL | Status: AC
Start: 2016-06-14 — End: 2016-06-14
  Administered 2016-06-14: 0.4 mg
  Filled 2016-06-14: qty 1

## 2016-06-14 MED ORDER — FUROSEMIDE 10 MG/ML IJ SOLN
40.0000 mg | Freq: Every day | INTRAMUSCULAR | Status: DC
Start: 2016-06-15 — End: 2016-06-16
  Administered 2016-06-15 – 2016-06-16 (×2): 40 mg via INTRAVENOUS
  Filled 2016-06-14 (×2): qty 4

## 2016-06-14 MED ORDER — MORPHINE SULFATE (PF) 2 MG/ML IV SOLN
INTRAVENOUS | Status: AC
  Administered 2016-06-14: 2 mg via INTRAVENOUS
  Filled 2016-06-14: qty 1

## 2016-06-14 MED ORDER — SUCRALFATE 1 GM/10ML PO SUSP
1.0000 g | Freq: Three times a day (TID) | ORAL | Status: DC
  Administered 2016-06-14 – 2016-06-19 (×18): 1 g via ORAL
  Filled 2016-06-14 (×19): qty 10

## 2016-06-14 MED ORDER — MAGNESIUM SULFATE IN D5W 1-5 GM/100ML-% IV SOLN
1.0000 g | Freq: Once | INTRAVENOUS | Status: AC
  Administered 2016-06-14: 1 g via INTRAVENOUS
  Filled 2016-06-14: qty 100

## 2016-06-14 MED ORDER — ONDANSETRON HCL 4 MG/2ML IJ SOLN
4.0000 mg | Freq: Four times a day (QID) | INTRAMUSCULAR | Status: DC | PRN
  Administered 2016-06-14 – 2016-06-15 (×2): 4 mg via INTRAVENOUS
  Filled 2016-06-14 (×2): qty 2

## 2016-06-14 MED ORDER — ONDANSETRON HCL 4 MG/2ML IJ SOLN
INTRAMUSCULAR | Status: AC
  Administered 2016-06-14: 4 mg
  Filled 2016-06-14: qty 2

## 2016-06-14 NOTE — Progress Notes (Signed)
PT note Assessment completed.  Pt will benefit from using her RW at all times.  Pt agrees.  HHPT recommended as well.  Will follow acutely and full note to follow. Thanks.  [place] [phone] (pager)

## 2016-06-14 NOTE — Progress Notes (Signed)
Patient alert and oriented c/o nausea and chest pain 05/1011 lead EKG done, nitro given x2,,  2mg  of morphine given per order, v/s stable see  flowsheets   For v/s. MD notified, MD came to see patient.. Now pt. Stated the pain is better 2/10, pt. Is sleeping  Now. See manage orders for new medications ordered. Will continue to monitor the patient.

## 2016-06-14 NOTE — Evaluation (Signed)
Physical Therapy Evaluation Patient Details Name: Abigail [name] MRN: [id number] DOB: 1946-10-18 Today's Date: 06/14/2016   History of Present Illness  Ms. Acri was admitted 06/12/2016 with exertional dyspnea. She has a history of anemia dating to October 2016. She has received multiple red cell transfusions this year. She reports feeling better for several weeks after each red cell transfusion.On 06/12/2016 the hemoglobin returned at 7, MCV 91.3, platelets 190,000, Holiday Mcmen[place] count 6.0, and the absolute neutral count returned at 3.4.  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    Clinical Impression  Pt admitted with above diagnosis. Pt currently with functional limitations due to the deficits listed below (see PT Problem List). Pt was able to ambulate in hallway.  Recommend use of RW to pt for safety at home and pt in agreement.  Pt O2 sats 89-94% on RA with ambulation.  DOE 1-2/4.  Pt will benefit from skilled PT to increase their independence and safety with mobility to allow discharge to the venue listed below.    Follow Up Recommendations Home health PT;Supervision - Intermittent    Equipment Recommendations  None recommended by PT    Recommendations for Other Services       Precautions / Restrictions Precautions Precautions: Fall Restrictions Weight Bearing Restrictions: No      Mobility  Bed Mobility Overal bed mobility: Needs Assistance Bed Mobility: Supine to Sit     Supine to sit: Supervision     General bed mobility comments: Pt was able to come to EOB on her own.   Transfers Overall transfer level: Needs assistance Equipment used: None Transfers: Sit to/from Stand Sit to Stand: Min guard         General transfer comment: Pt needed time to stand but no LOB.   Ambulation/Gait Ambulation/Gait assistance: Min guard Ambulation Distance (Feet): 150 Feet Assistive device: None Gait Pattern/deviations: Step-through pattern;Decreased stride length;Drifts right/left   Gait velocity  interpretation: Below normal speed for age/gender General Gait Details: Pt was able to ambulate with supervision for the most part with pt very slow guarded gait.  No significant LOB however did not challenge pt.  Recommended to pt that she use RW at home for ultimate safety and pt agrees as she feels she is not at her baseline.   Stairs            Wheelchair Mobility    Modified Rankin (Stroke Patients Only)       Balance Overall balance assessment: Needs assistance         Standing balance support: No upper extremity supported;During functional activity Standing balance-Leahy Scale: Fair Standing balance comment: can stand statically without UE support for up to a minute.             High level balance activites: Direction changes;Turns;Sudden stops;Head turns High Level Balance Comments: Pt did not need physical assist but guarded pt closely. Recommend use of RW to pt as she drifts occasionally and could be at risk for fall in challenging environment.              Pertinent Vitals/Pain Pain Assessment: No/denies pain  VSS    Home Living Family/patient expects to be discharged to:: Private residence Living Arrangements: Alone Available Help at Discharge: Family;Friend(s);Available PRN/intermittently (sisters and friends check frequently per pt) Type of Home: House Home Access: Stairs to enter Entrance Stairs-Rails: None Entrance Stairs-Number of Steps: 2 Home Layout: One level Home Equipment: Walker - 2 wheels      Prior Function Level of Independence: Independent  Hand Dominance        Extremity/Trunk Assessment   Upper Extremity Assessment: Defer to OT evaluation           Lower Extremity Assessment: Generalized weakness      Cervical / Trunk Assessment: Kyphotic  Communication   Communication: No difficulties  Cognition Arousal/Alertness: Awake/alert Behavior During Therapy: WFL for tasks  assessed/performed Overall Cognitive Status: Within Functional Limits for tasks assessed                      General Comments      Exercises        Assessment/Plan    PT Assessment Patient needs continued PT services  PT Diagnosis Generalized weakness   PT Problem List Decreased activity tolerance;Decreased mobility;Decreased balance;Decreased knowledge of use of DME;Decreased safety awareness;Decreased knowledge of precautions  PT Treatment Interventions Gait training;DME instruction;Stair training;Balance training;Therapeutic exercise;Therapeutic activities;Functional mobility training;Patient/family education   PT Goals (Current goals can be found in the Care Plan section) Acute Rehab PT Goals Patient Stated Goal: to go home PT Goal Formulation: With patient Time For Goal Achievement: 06/28/16 Potential to Achieve Goals: Good    Frequency Min 3X/week   Barriers to discharge        Co-evaluation               End of Session Equipment Utilized During Treatment: Gait belt Activity Tolerance: Patient limited by fatigue Patient left: in bed;with call bell/phone within reach Nurse Communication: Mobility status         Time: 8307-3543 PT Time Calculation (min) (ACUTE ONLY): 11 min   Charges:   PT Evaluation $PT Eval Moderate Complexity: 1 Procedure     PT G CodesDenice Paradise 06/22/2016, 1:42 PM [place] Jhonny Calixto,PT Acute Rehabilitation [phone] (pager)

## 2016-06-14 NOTE — Progress Notes (Signed)
Ref: Philis Fendt, MD   Subjective:  Feeling nauseated. T max 99 degree F. Skeletal survey positive for multiple lucency suggestive of multiple myeloma. Hypokalemia improving. Able to walk in room.  Objective:  Vital Signs in the last 24 hours: Temp:  [98 F (36.7 C)-99 F (37.2 C)] 98 F (36.7 C) (08/26 0949) Pulse Rate:  [58-70] 70 (08/26 0949) Cardiac Rhythm: Normal sinus rhythm (08/26 0949) Resp:  [17-18] 18 (08/26 0453) BP: (128-170)/(61-72) 141/70 (08/26 0949) SpO2:  [91 %-97 %] 97 % (08/26 0949) Weight:  [76.1 kg (167 lb 12.8 oz)] 76.1 kg (167 lb 12.8 oz) (08/26 0453)  Physical Exam: BP Readings from Last 1 Encounters:  06/14/16 (!) 141/70    Wt Readings from Last 1 Encounters:  06/14/16 76.1 kg (167 lb 12.8 oz)    Weight change: -3.175 kg (-7 lb)  HEENT: [place]/AT, Eyes-Brown, PERL, EOMI, Conjunctiva-Pale pink, Sclera-Non-icteric Neck: No JVD, No bruit, Trachea midline. Lungs:  Clear, Bilateral. Cardiac:  Regular rhythm, normal S1 and S2, no S3. III/VI systolic murmur. Abdomen:  Soft, non-tender. Extremities:  No edema present. No cyanosis. No clubbing. CNS: AxOx3, Cranial nerves grossly intact, moves all 4 extremities. Right handed. Skin: Warm and dry.   Intake/Output from previous day: 08/25 0701 - 08/26 0700 In: 1044.5 [P.O.:222; Blood:822.5] Out: 3200 [Urine:3200]    Lab Results: BMET    Component Value Date/Time   NA 133 (L) 06/14/2016 0504   NA 131 (L) 06/13/2016 0342   NA 132 (L) 06/12/2016 1610   K 3.4 (L) 06/14/2016 0504   K 3.2 (L) 06/13/2016 0342   K 3.3 (L) 06/12/2016 1610   CL 106 06/14/2016 0504   CL 108 06/13/2016 0342   CL 112 (H) 06/12/2016 1610   CO2 23 06/14/2016 0504   CO2 19 (L) 06/13/2016 0342   CO2 17 (L) 06/12/2016 1610   GLUCOSE 88 06/14/2016 0504   GLUCOSE 89 06/13/2016 0342   GLUCOSE 94 06/12/2016 1610   BUN 8 06/14/2016 0504   BUN 7 06/13/2016 0342   BUN 9 06/12/2016 1610   CREATININE 0.97 06/14/2016 0504   CREATININE  0.91 06/13/2016 0342   CREATININE 1.12 (H) 06/12/2016 1610   CALCIUM 9.0 06/14/2016 0504   CALCIUM 8.6 (L) 06/13/2016 0342   CALCIUM 8.5 (L) 06/12/2016 1610   GFRNONAA 58 (L) 06/14/2016 0504   GFRNONAA >60 06/13/2016 0342   GFRNONAA 49 (L) 06/12/2016 1610   GFRAA >60 06/14/2016 0504   GFRAA >60 06/13/2016 0342   GFRAA 57 (L) 06/12/2016 1610   CBC    Component Value Date/Time   WBC 5.5 06/14/2016 0504   RBC 3.20 (L) 06/14/2016 0504   HGB 9.4 (L) 06/14/2016 0504   HCT 29.4 (L) 06/14/2016 0504   PLT 189 06/14/2016 0504   MCV 91.9 06/14/2016 0504   MCH 29.4 06/14/2016 0504   MCHC 32.0 06/14/2016 0504   RDW 20.7 (H) 06/14/2016 0504   LYMPHSABS 1.9 06/12/2016 1610   MONOABS 0.5 06/12/2016 1610   EOSABS 0.1 06/12/2016 1610   BASOSABS 0.1 06/12/2016 1610   HEPATIC Function Panel  Recent Labs  11/01/15 1830 01/11/16 1819 06/12/16 1610  PROT >12.0* >12.0* >12.0*   HEMOGLOBIN A1C No components found for: HGA1C,  MPG CARDIAC ENZYMES Lab Results  Component Value Date   CKTOTAL 129 01/01/2012   CKMB 4.6 (H) 01/01/2012   TROPONINI 1.59 (HH) 06/13/2016   TROPONINI 1.56 (HH) 06/12/2016   TROPONINI 1.36 (HH) 06/12/2016   BNP No results for  input(s): PROBNP in the last 8760 hours. TSH  Recent Labs  11/01/15 1830  TSH 0.467   CHOLESTEROL  Recent Labs  11/02/15 0630  CHOL 86    Scheduled Meds: . amLODipine  5 mg Oral Q2000  . ferrous sulfate  325 mg Oral BID  . FLUoxetine  10 mg Oral Q2000  . [START ON 06/15/2016] furosemide  40 mg Intravenous Daily  . isosorbide mononitrate  120 mg Oral BID  . losartan  100 mg Oral Daily  . magnesium sulfate 1 - 4 g bolus IVPB  1 g Intravenous Once  . multivitamin with minerals  1 tablet Oral Daily  . pantoprazole  40 mg Oral QAC breakfast  . potassium chloride  20 mEq Oral TID  . pravastatin  40 mg Oral QPM  . sodium chloride flush  3 mL Intravenous Q12H  . vitamin C  1,000 mg Oral BID   Continuous Infusions:  PRN  Meds:.sodium chloride, sodium chloride flush  Assessment/Plan: Acute diastolic left heart failure  Shortness of breath Symptomatic anemia-improving Most likely multiple myeloma-Labs and biopsy pending Essential hypertension Tobacco use disorder Hypokalemia-improving Hypoalbuminemia with marked proteinemia H/O bilateral renal artery stenosis with bilateral stents Possible malnutrition  Decrease lasix by 50 %. Increase activity.   LOS: 1 day    [name]  MD  06/14/2016, 11:16 AM

## 2016-06-15 DIAGNOSIS — C9000 Multiple myeloma not having achieved remission: Secondary | ICD-10-CM

## 2016-06-15 DIAGNOSIS — R0602 Shortness of breath: Secondary | ICD-10-CM

## 2016-06-15 LAB — BASIC METABOLIC PANEL
ANION GAP: 4 — AB (ref 5–15)
BUN: 16 mg/dL (ref 6–20)
CHLORIDE: 106 mmol/L (ref 101–111)
CO2: 21 mmol/L — AB (ref 22–32)
Calcium: 8.9 mg/dL (ref 8.9–10.3)
Creatinine, Ser: 1.41 mg/dL — ABNORMAL HIGH (ref 0.44–1.00)
GFR calc non Af Amer: 37 mL/min — ABNORMAL LOW (ref >60)
GFR, EST AFRICAN AMERICAN: 43 mL/min — AB (ref >60)
Glucose, Bld: 128 mg/dL — ABNORMAL HIGH (ref 65–99)
Potassium: 4.4 mmol/L (ref 3.5–5.1)
Sodium: 131 mmol/L — ABNORMAL LOW (ref 135–145)

## 2016-06-15 LAB — CBC
HEMATOCRIT: 35.4 % — AB (ref 36.0–46.0)
Hemoglobin: 10.8 g/dL — ABNORMAL LOW (ref 12.0–15.0)
MCH: 28.5 pg (ref 26.0–34.0)
MCHC: 30.5 g/dL (ref 30.0–36.0)
MCV: 93.4 fL (ref 78.0–100.0)
Platelets: 177 10*3/uL (ref 150–400)
RBC: 3.79 MIL/uL — ABNORMAL LOW (ref 3.87–5.11)
RDW: 21.1 % — AB (ref 11.5–15.5)
WBC: 6.4 10*3/uL (ref 4.0–10.5)

## 2016-06-15 MED ORDER — FAMOTIDINE IN NACL 20-0.9 MG/50ML-% IV SOLN
20.0000 mg | Freq: Two times a day (BID) | INTRAVENOUS | Status: DC
  Administered 2016-06-15 – 2016-06-19 (×9): 20 mg via INTRAVENOUS
  Filled 2016-06-15 (×12): qty 50

## 2016-06-15 MED ORDER — POTASSIUM CHLORIDE IN NACL 20-0.9 MEQ/L-% IV SOLN
INTRAVENOUS | Status: DC
  Administered 2016-06-15: 14:00:00 via INTRAVENOUS
  Filled 2016-06-15: qty 1000

## 2016-06-15 MED ORDER — ZOLEDRONIC ACID 4 MG/5ML IV CONC
4.0000 mg | Freq: Once | INTRAVENOUS | Status: AC
  Administered 2016-06-15: 4 mg via INTRAVENOUS
  Filled 2016-06-15: qty 5

## 2016-06-15 NOTE — Progress Notes (Signed)
Ref: Philis Fendt, MD   Subjective:  Resting comfortably after episodes of nausea and vomiting. Skeletal survey positive for multiple lucency. T max 99 degree F. No chest pain today.  Objective:  Vital Signs in the last 24 hours: Temp:  [98.3 F (36.8 C)-99 F (37.2 C)] 99 F (37.2 C) (08/27 1953) Pulse Rate:  [66-71] 71 (08/27 1953) Cardiac Rhythm: Normal sinus rhythm (08/27 1900) Resp:  [18-20] 20 (08/27 1953) BP: (153-176)/(68-108) 164/70 (08/27 1953) SpO2:  [94 %-100 %] 98 % (08/27 1953)  Physical Exam: BP Readings from Last 1 Encounters:  06/15/16 (!) 164/70    Wt Readings from Last 1 Encounters:  06/14/16 76.1 kg (167 lb 12.8 oz)    Weight change:   HEENT: [place]/AT, Eyes-Brown, PERL, EOMI, Conjunctiva-Pale pink, Sclera-Non-icteric Neck: No JVD, No bruit, Trachea midline. Lungs:  Clear, Bilateral. Cardiac:  Regular rhythm, normal S1 and S2, no S3. III/VI systolic murmur. Abdomen:  Soft, non-tender. Extremities:  No edema present. No cyanosis. No clubbing. CNS: AxOx3, Cranial nerves grossly intact, moves all 4 extremities. Right handed. Skin: Warm and dry.   Intake/Output from previous day: 08/26 0701 - 08/27 0700 In: 580 [P.O.:580] Out: 800 [Urine:800]    Lab Results: BMET    Component Value Date/Time   NA 131 (L) 06/15/2016 0230   NA 133 (L) 06/14/2016 0504   NA 131 (L) 06/13/2016 0342   K 4.4 06/15/2016 0230   K 3.4 (L) 06/14/2016 0504   K 3.2 (L) 06/13/2016 0342   CL 106 06/15/2016 0230   CL 106 06/14/2016 0504   CL 108 06/13/2016 0342   CO2 21 (L) 06/15/2016 0230   CO2 23 06/14/2016 0504   CO2 19 (L) 06/13/2016 0342   GLUCOSE 128 (H) 06/15/2016 0230   GLUCOSE 88 06/14/2016 0504   GLUCOSE 89 06/13/2016 0342   BUN 16 06/15/2016 0230   BUN 8 06/14/2016 0504   BUN 7 06/13/2016 0342   CREATININE 1.41 (H) 06/15/2016 0230   CREATININE 0.97 06/14/2016 0504   CREATININE 0.91 06/13/2016 0342   CALCIUM 8.9 06/15/2016 0230   CALCIUM 9.0 06/14/2016 0504    CALCIUM 8.6 (L) 06/13/2016 0342   GFRNONAA 37 (L) 06/15/2016 0230   GFRNONAA 58 (L) 06/14/2016 0504   GFRNONAA >60 06/13/2016 0342   GFRAA 43 (L) 06/15/2016 0230   GFRAA >60 06/14/2016 0504   GFRAA >60 06/13/2016 0342   CBC    Component Value Date/Time   WBC 6.4 06/15/2016 0553   RBC 3.79 (L) 06/15/2016 0553   HGB 10.8 (L) 06/15/2016 0553   HCT 35.4 (L) 06/15/2016 0553   PLT 177 06/15/2016 0553   MCV 93.4 06/15/2016 0553   MCH 28.5 06/15/2016 0553   MCHC 30.5 06/15/2016 0553   RDW 21.1 (H) 06/15/2016 0553   LYMPHSABS 1.9 06/12/2016 1610   MONOABS 0.5 06/12/2016 1610   EOSABS 0.1 06/12/2016 1610   BASOSABS 0.1 06/12/2016 1610   HEPATIC Function Panel  Recent Labs  11/01/15 1830 01/11/16 1819 06/12/16 1610  PROT >12.0* >12.0* >12.0*   HEMOGLOBIN A1C No components found for: HGA1C,  MPG CARDIAC ENZYMES Lab Results  Component Value Date   CKTOTAL 129 01/01/2012   CKMB 4.6 (H) 01/01/2012   TROPONINI 1.59 (HH) 06/13/2016   TROPONINI 1.56 (HH) 06/12/2016   TROPONINI 1.36 (HH) 06/12/2016   BNP No results for input(s): PROBNP in the last 8760 hours. TSH  Recent Labs  11/01/15 1830  TSH 0.467   CHOLESTEROL  Recent Labs  11/02/15 0630  CHOL 86    Scheduled Meds: . amLODipine  5 mg Oral Q2000  . famotidine (PEPCID) IV  20 mg Intravenous Q12H  . FLUoxetine  10 mg Oral Q2000  . furosemide  40 mg Intravenous Daily  . isosorbide mononitrate  120 mg Oral BID  . losartan  100 mg Oral Daily  . multivitamin with minerals  1 tablet Oral Daily  . pantoprazole  40 mg Oral QAC breakfast  . pravastatin  40 mg Oral QPM  . sodium chloride flush  3 mL Intravenous Q12H  . sucralfate  1 g Oral TID WC & HS  . vitamin C  1,000 mg Oral BID   Continuous Infusions: . 0.9 % NaCl with KCl 20 mEq / L 50 mL/hr at 06/15/16 1410   PRN Meds:.sodium chloride, ondansetron (ZOFRAN) IV, sodium chloride flush  Assessment/Plan: Acute diastolic left heart failure  Symptomatic  anemia with exertional dyspnea and weakness Anemia, Marked proteinemia and multiple skeletal lucency suggesting multiple myeloma Essential hypertension Hypokalemia-corrected. Hyponatremia Hypoalbuminemia H/O bilateral renal artery stenosis with bilateral stents. Possible malnutrition Acute renal failure  Change fluid to NS with 20 meq. Potassium. Increase activity as tolerated.  Start IV famotidine.   LOS: 2 days    [name]  MD  06/15/2016, 9:30 PM

## 2016-06-15 NOTE — Progress Notes (Signed)
It certainly looks like Abigail [name] has myeloma. She had a bone survey done. This showed multiple small lytic lesions throughout her skeleton. There is a large lesion in the proximal right fibula. This measures 1.9 x 3.6 cm. I think that given the fact that the fibula is non-weight bearing, I think we are okay with this lesion for right now.  Her protein studies have not come back yet.  I will also send off a 24-hour urine on her.  Her hemoglobin on 826 was 9.4. She has come up from 6.6. She has been getting some transfusions.  She is going to have a bone marrow biopsy on Monday.  She's had no fever. She's had some occasional chest discomfort.  She had an echocardiogram on the 25th. This showed severe mitral regurgitation. She had dilated right and left atrium.  On her physical exam, her vital signs are all stable. I really cannot find anything specific on her physical exam. There is no lymphadenopathy. Her lungs sound pretty clear bilaterally. There may be some slight decrease over on the left side. Cardiac exam is regular rate and rhythm. Abdomen is soft. There is no palpable liver or spleen tip. Extremities shows no clubbing, cyanosis or edema.  The workup for Abigail [name] continues. Again she has myeloma from all indicators. It will be interested to see what the bone marrow biopsy shows.  We will continue to follow along.  I will order her some Zometa. This will be for her bones.  Pete Ennever, MD  Psalm 27:1   

## 2016-06-16 ENCOUNTER — Inpatient Hospital Stay (HOSPITAL_COMMUNITY): Payer: Medicare HMO

## 2016-06-16 DIAGNOSIS — I340 Nonrheumatic mitral (valve) insufficiency: Secondary | ICD-10-CM

## 2016-06-16 LAB — KAPPA/LAMBDA LIGHT CHAINS
KAPPA, LAMDA LIGHT CHAIN RATIO: 0.01 — AB (ref 0.26–1.65)
Kappa free light chain: 5.2 mg/L (ref 3.3–19.4)
LAMDA FREE LIGHT CHAINS: 808.3 mg/L — AB (ref 5.7–26.3)

## 2016-06-16 LAB — BASIC METABOLIC PANEL
Anion gap: 4 — ABNORMAL LOW (ref 5–15)
BUN: 15 mg/dL (ref 6–20)
CHLORIDE: 103 mmol/L (ref 101–111)
CO2: 23 mmol/L (ref 22–32)
Calcium: 8.5 mg/dL — ABNORMAL LOW (ref 8.9–10.3)
Creatinine, Ser: 1.12 mg/dL — ABNORMAL HIGH (ref 0.44–1.00)
GFR calc Af Amer: 57 mL/min — ABNORMAL LOW (ref >60)
GFR calc non Af Amer: 49 mL/min — ABNORMAL LOW (ref >60)
Glucose, Bld: 109 mg/dL — ABNORMAL HIGH (ref 65–99)
POTASSIUM: 3.5 mmol/L (ref 3.5–5.1)
Sodium: 130 mmol/L — ABNORMAL LOW (ref 135–145)

## 2016-06-16 LAB — BONE MARROW EXAM

## 2016-06-16 MED ORDER — MIDAZOLAM HCL 2 MG/2ML IJ SOLN
INTRAMUSCULAR | Status: AC
  Filled 2016-06-16: qty 2

## 2016-06-16 MED ORDER — MIDAZOLAM HCL 2 MG/2ML IJ SOLN
INTRAMUSCULAR | Status: AC | PRN
  Administered 2016-06-16: 1 mg via INTRAVENOUS

## 2016-06-16 MED ORDER — FENTANYL CITRATE (PF) 100 MCG/2ML IJ SOLN
INTRAMUSCULAR | Status: AC | PRN
  Administered 2016-06-16 (×2): 50 ug via INTRAVENOUS

## 2016-06-16 MED ORDER — LORAZEPAM 2 MG/ML IJ SOLN
INTRAMUSCULAR | Status: AC | PRN
  Administered 2016-06-16: 1 mg via INTRAVENOUS

## 2016-06-16 MED ORDER — ACETAMINOPHEN 325 MG PO TABS
650.0000 mg | ORAL_TABLET | Freq: Four times a day (QID) | ORAL | Status: DC | PRN
Start: 2016-06-16 — End: 2016-06-19
  Administered 2016-06-17 – 2016-06-19 (×4): 650 mg via ORAL
  Filled 2016-06-16 (×5): qty 2

## 2016-06-16 MED ORDER — FENTANYL CITRATE (PF) 100 MCG/2ML IJ SOLN
INTRAMUSCULAR | Status: AC
  Filled 2016-06-16: qty 2

## 2016-06-16 MED ORDER — LIDOCAINE HCL 1 % IJ SOLN
INTRAMUSCULAR | Status: AC
  Filled 2016-06-16: qty 20

## 2016-06-16 NOTE — Progress Notes (Signed)
Abigail [name] is a have her bone marrow biopsy today. The results probably will not be back for another couple days.  She is still in the process of having the myeloma worked up. Her immunoglobulin studies are not back yet. Her M spike is not back yet. We are doing a 24-hour urine on her.  We did go ahead and give her Zometa over the weekend.  Her labs a show that her creatinine is down to 1.12. Her calcium is 8.5.  She's not having any type of severe pain issues. There's still some occasional chest discomfort.  On her physical exam, her vital signs all look good. Her temperature is 98.5. Pulse is 60. Blood pressure 127/67.  There is no new findings on her physical exam.  Again, I do believe that Abigail [name] has myeloma. 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 Hopefully, her myeloma studies we back the next day or so.  I would think that once we confirm myeloma, and we can think about getting her on systemic therapy. She'll be a good candidate for Revlimid/Velcade/Decadron.  We will continue to follow along.  Lattie Haw, MD .

## 2016-06-16 NOTE — Care Management Important Message (Signed)
Important Message  Patient Details  Name: Abigail [name] MRN: [id number] Date of Birth: 07/27/1946   Medicare Important Message Given:  Yes    [name] Mont[address] 06/16/2016, 11:53 AM

## 2016-06-16 NOTE — Progress Notes (Signed)
PT Cancellation Note  Patient Details Name: Abigail [name] MRN: [id number] DOB: [birth year]   Cancelled Treatment:    Reason Eval/Treat Not Completed: Patient at procedure or test/unavailable, pt at IR and then bedrest 2 hrs. Will check back as time allows.    [place], [place] 06/16/2016, 11:17 AM

## 2016-06-16 NOTE — Sedation Documentation (Signed)
Patient is resting comfortably. No complaints at this time 

## 2016-06-16 NOTE — Sedation Documentation (Signed)
Vital signs stable. 

## 2016-06-16 NOTE — Progress Notes (Signed)
Ref: Philis Fendt, MD   Subjective:  Post bone marrow biopsy had prolonged sleep. T max 99.4 degree F. Appreciate Oncology consult.  Objective:  Vital Signs in the last 24 hours: Temp:  [98.5 F (36.9 C)-99.4 F (37.4 C)] 98.7 F (37.1 C) (08/28 1939) Pulse Rate:  [54-80] 65 (08/28 1939) Cardiac Rhythm: Normal sinus rhythm (08/28 1110) Resp:  [12-21] 14 (08/28 1939) BP: (105-174)/(52-82) 140/65 (08/28 1939) SpO2:  [96 %-100 %] 96 % (08/28 1939) Weight:  [74.8 kg (164 lb 14.4 oz)] 74.8 kg (164 lb 14.4 oz) (08/28 0450)  Physical Exam: BP Readings from Last 1 Encounters:  06/16/16 140/65    Wt Readings from Last 1 Encounters:  06/16/16 74.8 kg (164 lb 14.4 oz)    Weight change:   HEENT: [place]/AT, Eyes-Brown, PERL, EOMI, Conjunctiva-Pale pink, Sclera-Non-icteric Neck: No JVD, No bruit, Trachea midline. Lungs:  Clear, Bilateral. Cardiac:  Regular rhythm, normal S1 and S2, no S3. III/VI systolic murmur. Abdomen:  Soft, non-tender. Extremities:  No edema present. No cyanosis. No clubbing. CNS: AxOx3, Cranial nerves grossly intact, moves all 4 extremities. Right handed. Skin: Warm and dry.   Intake/Output from previous day: 08/27 0701 - 08/28 0700 In: 680 [P.O.:580; IV Piggyback:100] Out: 1500 [Urine:1500]    Lab Results: BMET    Component Value Date/Time   NA 130 (L) 06/16/2016 0517   NA 131 (L) 06/15/2016 0230   NA 133 (L) 06/14/2016 0504   K 3.5 06/16/2016 0517   K 4.4 06/15/2016 0230   K 3.4 (L) 06/14/2016 0504   CL 103 06/16/2016 0517   CL 106 06/15/2016 0230   CL 106 06/14/2016 0504   CO2 23 06/16/2016 0517   CO2 21 (L) 06/15/2016 0230   CO2 23 06/14/2016 0504   GLUCOSE 109 (H) 06/16/2016 0517   GLUCOSE 128 (H) 06/15/2016 0230   GLUCOSE 88 06/14/2016 0504   BUN 15 06/16/2016 0517   BUN 16 06/15/2016 0230   BUN 8 06/14/2016 0504   CREATININE 1.12 (H) 06/16/2016 0517   CREATININE 1.41 (H) 06/15/2016 0230   CREATININE 0.97 06/14/2016 0504   CALCIUM 8.5  (L) 06/16/2016 0517   CALCIUM 8.9 06/15/2016 0230   CALCIUM 9.0 06/14/2016 0504   GFRNONAA 49 (L) 06/16/2016 0517   GFRNONAA 37 (L) 06/15/2016 0230   GFRNONAA 58 (L) 06/14/2016 0504   GFRAA 57 (L) 06/16/2016 0517   GFRAA 43 (L) 06/15/2016 0230   GFRAA >60 06/14/2016 0504   CBC    Component Value Date/Time   WBC 6.4 06/15/2016 0553   RBC 3.79 (L) 06/15/2016 0553   HGB 10.8 (L) 06/15/2016 0553   HCT 35.4 (L) 06/15/2016 0553   PLT 177 06/15/2016 0553   MCV 93.4 06/15/2016 0553   MCH 28.5 06/15/2016 0553   MCHC 30.5 06/15/2016 0553   RDW 21.1 (H) 06/15/2016 0553   LYMPHSABS 1.9 06/12/2016 1610   MONOABS 0.5 06/12/2016 1610   EOSABS 0.1 06/12/2016 1610   BASOSABS 0.1 06/12/2016 1610   HEPATIC Function Panel  Recent Labs  11/01/15 1830 01/11/16 1819 06/12/16 1610  PROT >12.0* >12.0* >12.0*   HEMOGLOBIN A1C No components found for: HGA1C,  MPG CARDIAC ENZYMES Lab Results  Component Value Date   CKTOTAL 129 01/01/2012   CKMB 4.6 (H) 01/01/2012   TROPONINI 1.59 (HH) 06/13/2016   TROPONINI 1.56 (HH) 06/12/2016   TROPONINI 1.36 (HH) 06/12/2016   BNP No results for input(s): PROBNP in the last 8760 hours. TSH  Recent Labs  11/01/15 1830  TSH 0.467   CHOLESTEROL  Recent Labs  11/02/15 0630  CHOL 86    Scheduled Meds: . amLODipine  5 mg Oral Q2000  . famotidine (PEPCID) IV  20 mg Intravenous Q12H  . fentaNYL      . FLUoxetine  10 mg Oral Q2000  . isosorbide mononitrate  120 mg Oral BID  . lidocaine      . losartan  100 mg Oral Daily  . midazolam      . multivitamin with minerals  1 tablet Oral Daily  . pantoprazole  40 mg Oral QAC breakfast  . pravastatin  40 mg Oral QPM  . sodium chloride flush  3 mL Intravenous Q12H  . sucralfate  1 g Oral TID WC & HS  . vitamin C  1,000 mg Oral BID   Continuous Infusions: . 0.9 % NaCl with KCl 20 mEq / L 10 mL/hr at 06/15/16 2314   PRN Meds:.sodium chloride, acetaminophen, ondansetron (ZOFRAN) IV, sodium  chloride flush  Assessment/Plan: Acute diastolic heart failure Symptomatic anemia with exertional dyspnea and weakness Probable multiple myeloma awating confirmatory tests results Essential hypertension Hyponatremia Hypoalbuminemia Possible malnutrition Improving renal failure H/O bilateral renal artery stenoses with bilateral stents.  Increase activity. Home soon with family member supervision.   LOS: 3 days    Abigail Dials  MD  06/16/2016, 9:15 PM

## 2016-06-16 NOTE — Sedation Documentation (Signed)
Patient is resting comfortably. Vitals stable at this time.

## 2016-06-16 NOTE — Procedures (Signed)
Interventional Radiology Procedure Note  Procedure: CT guided aspirate and core biopsy of right iliac bone Complications: None Recommendations: - Bedrest supine x 2 hrs - Follow biopsy results  Eryka Dolinger T. Kathlene Cote, M.D Pager:  [phone]

## 2016-06-17 LAB — TYPE AND SCREEN
ABO/RH(D): O POS
ANTIBODY SCREEN: POSITIVE
DAT, IGG: POSITIVE
UNIT DIVISION: 0
UNIT DIVISION: 0
Unit division: 0
Unit division: 0

## 2016-06-17 LAB — CBC
HEMATOCRIT: 29.9 % — AB (ref 36.0–46.0)
Hemoglobin: 9.4 g/dL — ABNORMAL LOW (ref 12.0–15.0)
MCH: 29.5 pg (ref 26.0–34.0)
MCHC: 31.4 g/dL (ref 30.0–36.0)
MCV: 93.7 fL (ref 78.0–100.0)
Platelets: 181 10*3/uL (ref 150–400)
RBC: 3.19 MIL/uL — AB (ref 3.87–5.11)
RDW: 21.3 % — AB (ref 11.5–15.5)
WBC: 6 10*3/uL (ref 4.0–10.5)

## 2016-06-17 LAB — MULTIPLE MYELOMA PANEL, SERUM
ALBUMIN/GLOB SERPL: 0.4 — AB (ref 0.7–1.7)
Albumin SerPl Elph-Mcnc: 3.4 g/dL (ref 2.9–4.4)
Alpha 1: 0.3 g/dL (ref 0.0–0.4)
Alpha2 Glob SerPl Elph-Mcnc: 0.7 g/dL (ref 0.4–1.0)
B-Globulin SerPl Elph-Mcnc: 1 g/dL (ref 0.7–1.3)
Gamma Glob SerPl Elph-Mcnc: 7.7 g/dL — ABNORMAL HIGH (ref 0.4–1.8)
Globulin, Total: 9.7 g/dL — ABNORMAL HIGH (ref 2.2–3.9)
IGA: 14 mg/dL — AB (ref 87–352)
IGM, SERUM: 22 mg/dL — AB (ref 26–217)
IgG (Immunoglobin G), Serum: 10786 mg/dL — ABNORMAL HIGH (ref 700–1600)
M Protein SerPl Elph-Mcnc: 7.6 g/dL — ABNORMAL HIGH
Total Protein ELP: 13.1 g/dL — ABNORMAL HIGH (ref 6.0–8.5)

## 2016-06-17 LAB — UIFE/LIGHT CHAINS/TP QN, 24-HR UR
% BETA, Urine: 8.6 %
ALPHA 1 URINE: 1.4 %
Albumin, U: 42.8 %
Alpha 2, Urine: 3.3 %
FREE LT CHN EXCR RATE: 13.3 mg/L (ref 1.35–24.19)
Free Kappa/Lambda Ratio: 0.01 — ABNORMAL LOW (ref 2.04–10.37)
Free Lambda Lt Chains,Ur: 950 mg/L — ABNORMAL HIGH (ref 0.24–6.66)
GAMMA GLOBULIN URINE: 43.9 %
M-SPIKE %, URINE: 40.9 % — AB
M-SPIKE, MG/24 HR: 3644 mg/(24.h) — AB
Time: 24 hours
Total Protein, Urine-Ur/day: 8909 mg/24 hr — ABNORMAL HIGH (ref 30–150)
Total Protein, Urine: 774.7 mg/dL

## 2016-06-17 LAB — BASIC METABOLIC PANEL
ANION GAP: 4 — AB (ref 5–15)
BUN: 20 mg/dL (ref 6–20)
CALCIUM: 7.4 mg/dL — AB (ref 8.9–10.3)
CO2: 22 mmol/L (ref 22–32)
Chloride: 102 mmol/L (ref 101–111)
Creatinine, Ser: 1.46 mg/dL — ABNORMAL HIGH (ref 0.44–1.00)
GFR, EST AFRICAN AMERICAN: 41 mL/min — AB (ref >60)
GFR, EST NON AFRICAN AMERICAN: 36 mL/min — AB (ref >60)
Glucose, Bld: 74 mg/dL (ref 65–99)
POTASSIUM: 5.1 mmol/L (ref 3.5–5.1)
Sodium: 128 mmol/L — ABNORMAL LOW (ref 135–145)

## 2016-06-17 MED ORDER — AMLODIPINE BESYLATE 2.5 MG PO TABS
2.5000 mg | ORAL_TABLET | Freq: Every day | ORAL | Status: DC
  Administered 2016-06-17 – 2016-06-18 (×2): 2.5 mg via ORAL
  Filled 2016-06-17 (×2): qty 1

## 2016-06-17 MED ORDER — FUROSEMIDE 40 MG PO TABS
40.0000 mg | ORAL_TABLET | Freq: Every day | ORAL | Status: DC
  Administered 2016-06-17 – 2016-06-19 (×3): 40 mg via ORAL
  Filled 2016-06-17: qty 1
  Filled 2016-06-17: qty 2
  Filled 2016-06-17: qty 1

## 2016-06-17 MED ORDER — FERROUS SULFATE 325 (65 FE) MG PO TABS
325.0000 mg | ORAL_TABLET | Freq: Every day | ORAL | Status: DC
  Administered 2016-06-18 – 2016-06-19 (×2): 325 mg via ORAL
  Filled 2016-06-17 (×2): qty 1

## 2016-06-17 MED ORDER — CALCIUM CARBONATE 1250 (500 CA) MG PO TABS
1.0000 | ORAL_TABLET | Freq: Every day | ORAL | Status: DC
  Administered 2016-06-17 – 2016-06-19 (×3): 500 mg via ORAL
  Filled 2016-06-17 (×3): qty 1

## 2016-06-17 MED ORDER — ISOSORBIDE MONONITRATE ER 60 MG PO TB24
60.0000 mg | ORAL_TABLET | Freq: Two times a day (BID) | ORAL | Status: DC
  Administered 2016-06-17 – 2016-06-19 (×5): 60 mg via ORAL
  Filled 2016-06-17 (×5): qty 1

## 2016-06-17 NOTE — Progress Notes (Signed)
Physical Therapy Treatment Patient Details Name: Abigail [name] MRN: [id number] DOB: 16-Jun-1946 Today's Date: 06/17/2016    History of Present Illness Abigail [name] was admitted 06/12/2016 with exertional dyspnea. She has a history of anemia dating to October 2016. She has received multiple red cell transfusions this year. She reports feeling better for several weeks after each red cell transfusion.On 06/12/2016 the hemoglobin returned at 7, MCV 91.3, platelets 190,000, white count 6.0, and the absolute neutral count returned at 3.4.  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      PT Comments    Initiated stair training today.  She said has asked her landlord to put up rails.  Practiced with 1 rail going sideways and 2 rails forward.  She had difficulty doing without UE support.  Con't to recommend HHPT.  Follow Up Recommendations  Home health PT;Supervision - Intermittent     Equipment Recommendations  None recommended by PT    Recommendations for Other Services       Precautions / Restrictions Restrictions Weight Bearing Restrictions: No    Mobility  Bed Mobility               General bed mobility comments: sitting up on EOB upon arrival  Transfers Overall transfer level: Needs assistance   Transfers: Sit to/from Stand Sit to Stand: Supervision         General transfer comment: good balance upon standing  Ambulation/Gait Ambulation/Gait assistance: Supervision Ambulation Distance (Feet): 150 Feet Assistive device: Rolling walker (2 wheeled) Gait Pattern/deviations: Step-through pattern;Decreased dorsiflexion - right     General Gait Details: Pt with improved speed with RW.  Did ambulate in room without AD with more tentative pattern.   Stairs Stairs: Yes Stairs assistance: Min guard Stair Management: One rail Left;Two rails;Step to pattern;Sideways;Forwards;Backwards Number of Stairs: 2 (x3) General stair comments: did sideways 2x with rail on L and 1x with 2 rails and 1x  without rail (1 step only)  Wheelchair Mobility    Modified Rankin (Stroke Patients Only)       Balance           Standing balance support: During functional activity Standing balance-Leahy Scale: Fair                      Cognition Arousal/Alertness: Awake/alert Behavior During Therapy: WFL for tasks assessed/performed Overall Cognitive Status: Within Functional Limits for tasks assessed                      Exercises      General Comments        Pertinent Vitals/Pain Pain Assessment: Faces Faces Pain Scale: Hurts a little bit Pain Location: R hip/buttocks from procedure Pain Descriptors / Indicators: Sore Pain Intervention(s): Monitored during session    Home Living                      Prior Function            PT Goals (current goals can now be found in the care plan section) Acute Rehab PT Goals Patient Stated Goal: to go home PT Goal Formulation: With patient Time For Goal Achievement: 06/28/16 Potential to Achieve Goals: Good Progress towards PT goals: Progressing toward goals    Frequency  Min 3X/week    PT Plan Current plan remains appropriate    Co-evaluation             End of Session Equipment Utilized During Treatment: Gait belt  Activity Tolerance: Patient tolerated treatment well Patient left: in chair;with call bell/phone within reach;Other (comment) (eating lunch)     Time: 0881-1031 PT Time Calculation (min) (ACUTE ONLY): 18 min  Charges:  $Gait Training: 8-22 mins                    G Codes:      Abigail [name] 06/17/2016, 2:49 PM

## 2016-06-17 NOTE — Progress Notes (Signed)
Ref: Philis Fendt, MD   Subjective:  Awake but weak. Afebrile. Hyponatremia continues. Bone marrow report pending.  Objective:  Vital Signs in the last 24 hours: Temp:  [98.4 F (36.9 C)-98.9 F (37.2 C)] 98.9 F (37.2 C) (08/29 1220) Pulse Rate:  [60-80] 80 (08/29 1220) Cardiac Rhythm: Normal sinus rhythm (08/29 0700) Resp:  [14-18] 18 (08/29 1220) BP: (124-140)/(58-84) 139/84 (08/29 1220) SpO2:  [96 %-97 %] 97 % (08/29 1220) Weight:  [76.8 kg (169 lb 5 oz)] 76.8 kg (169 lb 5 oz) (08/29 0415)  Physical Exam: BP Readings from Last 1 Encounters:  06/17/16 139/84    Wt Readings from Last 1 Encounters:  06/17/16 76.8 kg (169 lb 5 oz)    Weight change: 2.002 kg (4 lb 6.6 oz)  HEENT: [place]/AT, Eyes-Brown, PERL, EOMI, Conjunctiva-Pale pink, Sclera-Non-icteric Neck: No JVD, No bruit, Trachea midline. Lungs:  Clear, Bilateral. Cardiac:  Regular rhythm, normal S1 and S2, no S3. III/VI systolic murmur. Abdomen:  Soft, non-tender. Extremities:  No edema present. No cyanosis. No clubbing. CNS: AxOx3, Cranial nerves grossly intact, moves all 4 extremities. Right handed. Skin: Warm and dry.   Intake/Output from previous day: 08/28 0701 - 08/29 0700 In: 650 [P.O.:600; IV Piggyback:50] Out: 895 [Urine:895]    Lab Results: BMET    Component Value Date/Time   NA 128 (L) 06/17/2016 0456   NA 130 (L) 06/16/2016 0517   NA 131 (L) 06/15/2016 0230   K 5.1 06/17/2016 0456   K 3.5 06/16/2016 0517   K 4.4 06/15/2016 0230   CL 102 06/17/2016 0456   CL 103 06/16/2016 0517   CL 106 06/15/2016 0230   CO2 22 06/17/2016 0456   CO2 23 06/16/2016 0517   CO2 21 (L) 06/15/2016 0230   GLUCOSE 74 06/17/2016 0456   GLUCOSE 109 (H) 06/16/2016 0517   GLUCOSE 128 (H) 06/15/2016 0230   BUN 20 06/17/2016 0456   BUN 15 06/16/2016 0517   BUN 16 06/15/2016 0230   CREATININE 1.46 (H) 06/17/2016 0456   CREATININE 1.12 (H) 06/16/2016 0517   CREATININE 1.41 (H) 06/15/2016 0230   CALCIUM 7.4 (L)  06/17/2016 0456   CALCIUM 8.5 (L) 06/16/2016 0517   CALCIUM 8.9 06/15/2016 0230   GFRNONAA 36 (L) 06/17/2016 0456   GFRNONAA 49 (L) 06/16/2016 0517   GFRNONAA 37 (L) 06/15/2016 0230   GFRAA 41 (L) 06/17/2016 0456   GFRAA 57 (L) 06/16/2016 0517   GFRAA 43 (L) 06/15/2016 0230   CBC    Component Value Date/Time   WBC 6.0 06/17/2016 0456   RBC 3.19 (L) 06/17/2016 0456   HGB 9.4 (L) 06/17/2016 0456   HCT 29.9 (L) 06/17/2016 0456   PLT 181 06/17/2016 0456   MCV 93.7 06/17/2016 0456   MCH 29.5 06/17/2016 0456   MCHC 31.4 06/17/2016 0456   RDW 21.3 (H) 06/17/2016 0456   LYMPHSABS 1.9 06/12/2016 1610   MONOABS 0.5 06/12/2016 1610   EOSABS 0.1 06/12/2016 1610   BASOSABS 0.1 06/12/2016 1610   HEPATIC Function Panel  Recent Labs  11/01/15 1830 01/11/16 1819 06/12/16 1610  PROT >12.0* >12.0* >12.0*   HEMOGLOBIN A1C No components found for: HGA1C,  MPG CARDIAC ENZYMES Lab Results  Component Value Date   CKTOTAL 129 01/01/2012   CKMB 4.6 (H) 01/01/2012   TROPONINI 1.59 (HH) 06/13/2016   TROPONINI 1.56 (HH) 06/12/2016   TROPONINI 1.36 (HH) 06/12/2016   BNP No results for input(s): PROBNP in the last 8760 hours. TSH  Recent  Labs  11/01/15 1830  TSH 0.467   CHOLESTEROL  Recent Labs  11/02/15 0630  CHOL 86    Scheduled Meds: . amLODipine  2.5 mg Oral Q2000  . calcium carbonate  1 tablet Oral Q breakfast  . famotidine (PEPCID) IV  20 mg Intravenous Q12H  . [START ON 06/18/2016] ferrous sulfate  325 mg Oral Q breakfast  . FLUoxetine  10 mg Oral Q2000  . furosemide  40 mg Oral Daily  . isosorbide mononitrate  60 mg Oral BID  . multivitamin with minerals  1 tablet Oral Daily  . pantoprazole  40 mg Oral QAC breakfast  . pravastatin  40 mg Oral QPM  . sodium chloride flush  3 mL Intravenous Q12H  . sucralfate  1 g Oral TID WC & HS  . vitamin C  1,000 mg Oral BID   Continuous Infusions:  PRN Meds:.sodium chloride, acetaminophen, ondansetron (ZOFRAN) IV, sodium  chloride flush  Assessment/Plan: Acute diastolic heart failure Symptomatic anemia with exertional dyspnea and weakness Multiple myeloma with marked proteinemia/Bence Jones Protein Essntial hypertension Hyponatremia Possible malnutrition Acute renal failure-stabilizing H/O bilateral renal artery stenoses with bilateral stents.  Increase activity. Home in AM and OP oncology follow up.   LOS: 4 days    [name]  MD  06/17/2016, 5:30 PM

## 2016-06-18 DIAGNOSIS — C9000 Multiple myeloma not having achieved remission: Secondary | ICD-10-CM

## 2016-06-18 DIAGNOSIS — I2510 Atherosclerotic heart disease of native coronary artery without angina pectoris: Secondary | ICD-10-CM

## 2016-06-18 DIAGNOSIS — D630 Anemia in neoplastic disease: Secondary | ICD-10-CM

## 2016-06-18 MED ORDER — DEXAMETHASONE 2 MG PO TABS
20.0000 mg | ORAL_TABLET | Freq: Two times a day (BID) | ORAL | Status: AC
  Administered 2016-06-18 (×2): 20 mg via ORAL
  Filled 2016-06-18 (×2): qty 1

## 2016-06-18 NOTE — Progress Notes (Signed)
IP PROGRESS NOTE  Subjective:   No complaint. She received Zometa 06/15/2016 after a bone survey confirmed lytic bone lesions. She underwent a bone marrow biopsy in radiology on 06/16/2016.  Objective: Vital signs in last 24 hours: Blood pressure 125/64, pulse 61, temperature 98.4 F (36.9 C), temperature source Oral, resp. rate 18, height 5' 5.5" (1.664 m), weight 163 lb 14.4 oz (74.3 kg), SpO2 96 %.  Intake/Output from previous day: 08/29 0701 - 08/30 0700 In: 26 [P.O.:780; IV Piggyback:100] Out: 950 [Urine:950]  Physical Exam:  HEENT: No thrush Lungs: Coarse rales at the lower posterior chest bilaterally, no respiratory distress Cardiac: Regular rate and rhythm Abdomen: No hepatosplenomegaly Extremities: No leg edema   Lab Results:  Recent Labs  06/17/16 0456  WBC 6.0  HGB 9.4*  HCT 29.9*  PLT 181    BMET  Recent Labs  06/16/16 0517 06/17/16 0456  NA 130* 128*  K 3.5 5.1  CL 103 102  CO2 23 22  GLUCOSE 109* 74  BUN 15 20  CREATININE 1.12* 1.46*  CALCIUM 8.5* 7.4*  06/13/2016-lambda light chains  808  06/12/2016-IgG 10,000 786, serum M spike 7.6 g/dL, IgG lambda  24-hour urine protein 06/15/2016-IgG lambda monoclonal protein, M spike-3644 mg per 24-hour  Studies/Results: Ct Biopsy  Result Date: 06/16/2016 CLINICAL DATA:  Multiple myeloma and need for bone marrow biopsy. EXAM: CT GUIDED BONE MARROW ASPIRATION AND BIOPSY ANESTHESIA/SEDATION: Versed 2.0 mg IV, Fentanyl 100 mcg IV Total Moderate Sedation Time 10 minutes. The patient's level of consciousness and physiologic status were continuously monitored during the procedure by Radiology nursing. PROCEDURE: The procedure risks, benefits, and alternatives were explained to the patient. Questions regarding the procedure were encouraged and answered. The patient understands and consents to the procedure. A time-out was performed prior to the procedure. The right gluteal region was prepped with Betadine.  Sterile gown and sterile gloves were used for the procedure. Local anesthesia was provided with 1% Lidocaine. Under CT guidance, an 11 gauge OnControl bone cutting needle was advanced from a posterior approach into the right iliac bone. Needle positioning was confirmed with CT. Initial non heparinized and heparinized aspirate samples were obtained of bone marrow. Core biopsy sample was obtained through the outer needle. COMPLICATIONS: None FINDINGS: Inspection of initial aspirate did reveal visible particles. Intact core biopsy sample was obtained. IMPRESSION: CT guided bone marrow biopsy of right posterior iliac bone with both aspirate and core samples obtained. Electronically Signed   By: Aletta Edouard M.D.   On: 06/16/2016 12:45    Medications: I have reviewed the patient's current medications.  Assessment/Plan: 1. Multiple myeloma, IgG lambda, bone marrow biopsy 06/15/2016 confirmed multiple myeloma  Elevated serum free lambda light chains  Lambda light chain proteinuria  Lytic bone lesions on a bone survey 06/13/2016  2. Severe anemia secondary to #1  3. 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  History of coronary artery disease/myocardial infarction  5. History of colon polyps  Abigail [name] has been diagnosed with multiple myeloma. I discussed the bone marrow findings with Dr.Smir. He reports the bone marrow aspirate is involved with approximate 50% plasma cells. Final immunohistochemistry stains are pending.  I discussed the diagnosis with Abigail [name] today. The plan is to begin treatment with Velcade/Decadron. We will begin the process to obtain Revlimid for her. I reviewed the potential side effects associated with the Velcade/Decadron regimen including the chance for nausea, diarrhea, neuropathy, and atypical infections. We discussed  the CNS and GI toxicity associated with Decadron. We discussed the potential increased risk of venous  thrombosis. She agrees to proceed. I recommend a daily aspirin while on treatment for multiple myeloma.  I will order a first cycle of Velcade/Decadron to begin today. We will schedule outpatient follow-up at the [facility] for next week.   LOS: 5 days   [name], MD   06/18/2016, 9:29 AM

## 2016-06-18 NOTE — Progress Notes (Signed)
Ref: Philis Fendt, MD   Subjective:  Sleeping. Awaiting Velcade IV dose from [facility] center. Appreciate Dr. Gearldine Shown help for care of this patient with multiple myeloma.  Objective:  Vital Signs in the last 24 hours: Temp:  [98.4 F (36.9 C)-99.4 F (37.4 C)] 98.8 F (37.1 C) (08/30 1151) Pulse Rate:  [61-78] 61 (08/30 1151) Cardiac Rhythm: Sinus bradycardia;Heart block (08/30 0700) Resp:  [18-20] 20 (08/30 1151) BP: (125-142)/(62-64) 142/63 (08/30 1151) SpO2:  [96 %-100 %] 96 % (08/30 1151) Weight:  [74.3 kg (163 lb 14.4 oz)] 74.3 kg (163 lb 14.4 oz) (08/30 0603)  Physical Exam: BP Readings from Last 1 Encounters:  06/18/16 (!) 142/63    Wt Readings from Last 1 Encounters:  06/18/16 74.3 kg (163 lb 14.4 oz)    Weight change: -2.455 kg (-5 lb 6.6 oz)  HEENT: [place]/AT, Eyes-Brown, PERL, EOMI, Conjunctiva-Pale pink, Sclera-Non-icteric Neck: No JVD, No bruit, Trachea midline. Lungs:  Clear, Bilateral. Cardiac:  Regular rhythm, normal S1 and S2, no S3. III/VI systolic murmur. Abdomen:  Soft, non-tender. Extremities:  No edema present. No cyanosis. No clubbing. CNS: AxOx3, Cranial nerves grossly intact, moves all 4 extremities. Right handed. Skin: Warm and dry.   Intake/Output from previous day: 08/29 0701 - 08/30 0700 In: 880 [P.O.:780; IV Piggyback:100] Out: 950 [Urine:950]    Lab Results: BMET    Component Value Date/Time   NA 128 (L) 06/17/2016 0456   NA 130 (L) 06/16/2016 0517   NA 131 (L) 06/15/2016 0230   K 5.1 06/17/2016 0456   K 3.5 06/16/2016 0517   K 4.4 06/15/2016 0230   CL 102 06/17/2016 0456   CL 103 06/16/2016 0517   CL 106 06/15/2016 0230   CO2 22 06/17/2016 0456   CO2 23 06/16/2016 0517   CO2 21 (L) 06/15/2016 0230   GLUCOSE 74 06/17/2016 0456   GLUCOSE 109 (H) 06/16/2016 0517   GLUCOSE 128 (H) 06/15/2016 0230   BUN 20 06/17/2016 0456   BUN 15 06/16/2016 0517   BUN 16 06/15/2016 0230   CREATININE 1.46 (H) 06/17/2016  0456   CREATININE 1.12 (H) 06/16/2016 0517   CREATININE 1.41 (H) 06/15/2016 0230   CALCIUM 7.4 (L) 06/17/2016 0456   CALCIUM 8.5 (L) 06/16/2016 0517   CALCIUM 8.9 06/15/2016 0230   GFRNONAA 36 (L) 06/17/2016 0456   GFRNONAA 49 (L) 06/16/2016 0517   GFRNONAA 37 (L) 06/15/2016 0230   GFRAA 41 (L) 06/17/2016 0456   GFRAA 57 (L) 06/16/2016 0517   GFRAA 43 (L) 06/15/2016 0230   CBC    Component Value Date/Time   WBC 6.0 06/17/2016 0456   RBC 3.19 (L) 06/17/2016 0456   HGB 9.4 (L) 06/17/2016 0456   HCT 29.9 (L) 06/17/2016 0456   PLT 181 06/17/2016 0456   MCV 93.7 06/17/2016 0456   MCH 29.5 06/17/2016 0456   MCHC 31.4 06/17/2016 0456   RDW 21.3 (H) 06/17/2016 0456   LYMPHSABS 1.9 06/12/2016 1610   MONOABS 0.5 06/12/2016 1610   EOSABS 0.1 06/12/2016 1610   BASOSABS 0.1 06/12/2016 1610   HEPATIC Function Panel  Recent Labs  11/01/15 1830 01/11/16 1819 06/12/16 1610  PROT >12.0* >12.0* >12.0*   HEMOGLOBIN A1C No components found for: HGA1C,  MPG CARDIAC ENZYMES Lab Results  Component Value Date   CKTOTAL 129 01/01/2012   CKMB 4.6 (H) 01/01/2012   TROPONINI 1.59 (HH) 06/13/2016   TROPONINI 1.56 (HH) 06/12/2016   TROPONINI 1.36 (HH) 06/12/2016   BNP  No results for input(s): PROBNP in the last 8760 hours. TSH  Recent Labs  11/01/15 1830  TSH 0.467   CHOLESTEROL  Recent Labs  11/02/15 0630  CHOL 86    Scheduled Meds: . amLODipine  2.5 mg Oral Q2000  . calcium carbonate  1 tablet Oral Q breakfast  . dexamethasone  20 mg Oral Q12H  . famotidine (PEPCID) IV  20 mg Intravenous Q12H  . ferrous sulfate  325 mg Oral Q breakfast  . FLUoxetine  10 mg Oral Q2000  . furosemide  40 mg Oral Daily  . isosorbide mononitrate  60 mg Oral BID  . multivitamin with minerals  1 tablet Oral Daily  . pantoprazole  40 mg Oral QAC breakfast  . pravastatin  40 mg Oral QPM  . sodium chloride flush  3 mL Intravenous Q12H  . sucralfate  1 g Oral TID WC & HS  . vitamin C  1,000  mg Oral BID   Continuous Infusions:  PRN Meds:.sodium chloride, acetaminophen, ondansetron (ZOFRAN) IV, sodium chloride flush  Assessment/Plan: Acute diastolic heart failure Symptomatic anemia with exertional dyspnea and weakness. Essential hypertension Hyponatremia Possible malnutrition H/O bilateral renal artery stenoses with bilateral stents. H/O chest pain CAD Acute renal failure   Awaiting Proteasome inhibitor Velcade infusion. Possible discharge tomorrow if stable post above medication infusion   LOS: 5 days    [name]  MD  06/18/2016, 2:00 PM

## 2016-06-19 ENCOUNTER — Other Ambulatory Visit: Payer: Self-pay | Admitting: *Deleted

## 2016-06-19 DIAGNOSIS — C9000 Multiple myeloma not having achieved remission: Secondary | ICD-10-CM

## 2016-06-19 MED ORDER — CALCIUM CARBONATE 1250 (500 CA) MG PO TABS: 1.0000 | ORAL_TABLET | Freq: Every day | ORAL | 3 refills | Status: DC

## 2016-06-19 MED ORDER — ISOSORBIDE MONONITRATE ER 60 MG PO TB24: 60.0000 mg | ORAL_TABLET | Freq: Every day | ORAL | 3 refills | Status: DC

## 2016-06-19 MED ORDER — BORTEZOMIB CHEMO SQ INJECTION 3.5 MG (2.5MG/ML)
1.3000 mg/m2 | Freq: Once | INTRAMUSCULAR | Status: AC
  Administered 2016-06-19: 2.5 mg via SUBCUTANEOUS
  Filled 2016-06-19: qty 2.5

## 2016-06-19 MED ORDER — ONDANSETRON HCL 4 MG PO TABS
8.0000 mg | ORAL_TABLET | Freq: Once | ORAL | Status: AC
  Administered 2016-06-19: 8 mg via ORAL
  Filled 2016-06-19: qty 2

## 2016-06-19 MED ORDER — FUROSEMIDE 40 MG PO TABS: 40.0000 mg | ORAL_TABLET | Freq: Every day | ORAL | 3 refills | Status: DC

## 2016-06-19 MED ORDER — SUCRALFATE 1 GM/10ML PO SUSP: 1.0000 g | Freq: Three times a day (TID) | ORAL | 0 refills | Status: DC

## 2016-06-19 NOTE — Progress Notes (Signed)
Patient given discharge instructions and all questions answered.  Patient discharged via wheelchair with all belongings.   

## 2016-06-19 NOTE — Care Management Important Message (Signed)
Important Message  Patient Details  Name: Abigail [name] MRN: [id number] Date of Birth: 1946-04-29   Medicare Important Message Given:  Yes    Kaveon Blatz Montine Circle 06/19/2016, 10:31 AM

## 2016-06-19 NOTE — Discharge Summary (Signed)
Physician Discharge Summary  Patient ID: Abigail [name] MRN: [id number] DOB/AGE: 1946-04-27 70 y.o.  Admit date: 06/12/2016 Discharge date: 06/19/2016  Admission Diagnoses: Shortness of breath Symptomatic anemia CAD Hypertension Tobacco use disorder Hyperproteinemia H/O bilateral renal artery stenoses with stents placement  Discharge Diagnoses:  Principal Problem: * Acute diastolic heart failure * Active Problems:   Symptomatic anemia with exertional dyspnea and weakness   Coronary atherosclerosis of native coronary artery   Iron deficiency anemia secondary to blood loss (chronic)   SOB (shortness of breath)   Moderate MR   Severe TR   Moderate to severe pulmonary hypertension   Malnutrition of moderate degree   Multiple myeloma (HCC)   Hyponatremia   Acute renal failure   Gait abnormality  Discharged Condition: fair  Hospital Course: 70 yrs old black female was admitted for exertional dyspnea x 1 week. Along with low hemoglobin she had markedly elevated total protein level. Multiple myeloma was suspected. Oncology consult was obtained and myeloma work up is suggestive of multiple myeloma. Skeletal survey showed multiple lucency of skull and other bones. Her acute diastolic heart failure responded to IV lasix. She received 2 units of packed RBC for her low hemoglobin. Her chest pain resolved post blood transfusion. Her shortness of breath and weakness are gradually improving. She will have home health PT for additional help at home.   She received one dose of Decadron and Proteasome inhibitor SQ Velcade injection without any side effect till the time of discharge 6 hours later. She will see Dr. Benay Spice in one week or less and will see me in 1 week and Primary care doctor in about 1 week.  Consults: cardiology and hematology/oncology  Significant Diagnostic Studies: labs: Hgb 6.6 to 7.0 on admission and 9.4 2 days before discharge. Mild hypokalemia and hyponatremia and creatinine of  1.46.   Bone marrow biopsy showed hypercellular bone marrow with plasma cell neoplasm.  24 hours urine showed near 9 G of protein and 950 mg. of free Lambda Lt Chains.  Urine immunofixation showed IgG monoclonal protein with lambda light chain specificity and 40.9 % M-spike.  Serum electrophoresis showed IgG monoclonal protein with lambda light chains specificaity and monoclonal free lambda light chains (Bence-Jones Protein).  Chest x-ray: Mild cardiomegaly with pulmonary edema.   DG Bone survey: Widespread innumerable tiny lucent lesions scattered throughout the osseous anatomy with 1.9 x 3.6 cm expansile destructive lesion in the proximal right fibula.  EKG-Sinus bradycardia and non-specific ST abnormality with prolonged QT.  Echocardiogram showed restrictive cardiomyopathy with echodense areas in septum and posterior wall, moderate MR, Severe TR, severely dilated LA and RA and moderate to severe pulmonary hypertension.  Treatments: cardiac meds: amlodipine, furosemide and Pravastatin and Velcade/Decadron for multiple myeloma treatment by Dr. Benay Spice.  Discharge Exam: Blood pressure (!) 162/60, pulse 60, temperature 97.9 F (36.6 C), temperature source Oral, resp. rate 18, height 5' 5.5" (1.664 m), weight 73.7 kg (162 lb 8 oz), SpO2 99 %. General appearance: alert, cooperative, appears stated age and no distress Head: Normocephalic, without obvious abnormality, atraumatic Eyes: conjunctivae pale, corneas clear. PERRL, EOM's intact.  Neck: no adenopathy, no carotid bruit, no JVD, supple, trachea midline and thyroid not enlarged. Resp: clear to auscultation bilaterally Cardio: regular rate and rhythm, S1, S2 normal, III/VI systolic murmur. No click, rub or gallop GI: soft, non-tender; bowel sounds normal; no masses,  no organomegaly Extremities: extremities normal, atraumatic, no cyanosis or edema Skin: Warm and dry. No rashes or lesions Neurologic: Alert and  oriented X 3, normal  strength and tone. Normal coordination and gait  Disposition: 01-Home or Self Care  Discharge Instructions    SCHEDULING COMMUNICATION    Complete by:  As directed   Chemotherapy Appointment  - 1 hour    TREATMENT CONDITIONS    Complete by:  As directed   Patient should have CBC & CMP within 7 days prior to chemotherapy administration. NOTIFY MD IF: ANC < 1500, Hemoglobin < 8, PLT < 100,000,  Total Bili > 1.5, Creatinine > 1.5, ALT & AST > 80 or if patient has unstable vital signs: Temperature > 38.5, SBP > 180 or < 90, RR > 30 or HR > 100.       Medication List    STOP taking these medications   docusate sodium 100 MG capsule Commonly known as:  COLACE   hydrochlorothiazide 12.5 MG tablet Commonly known as:  HYDRODIURIL   KLOR-CON 10 10 MEQ tablet Generic drug:  potassium chloride   losartan 100 MG tablet Commonly known as:  COZAAR     TAKE these medications   amLODipine 10 MG tablet Commonly known as:  NORVASC Take 5 mg by mouth at bedtime.   calcium carbonate 1250 (500 Ca) MG tablet Commonly known as:  OS-CAL - dosed in mg of elemental calcium Take 1 tablet (500 mg of elemental calcium total) by mouth daily with breakfast.   FLUoxetine 10 MG capsule Commonly known as:  PROZAC Take 10 mg by mouth at bedtime.   furosemide 40 MG tablet Commonly known as:  LASIX Take 1 tablet (40 mg total) by mouth daily.   IRON PO Take 1 tablet by mouth 2 (two) times daily.   isosorbide mononitrate 60 MG 24 hr tablet Commonly known as:  IMDUR Take 1 tablet (60 mg total) by mouth daily. What changed:  how much to take  when to take this   MULTIVITAMIN ADULT PO Take 1 tablet by mouth daily. Reported on 10/19/2015   pantoprazole 40 MG tablet Commonly known as:  PROTONIX Take 1 tablet (40 mg total) by mouth daily. Reported on 10/19/2015   pravastatin 40 MG tablet Commonly known as:  PRAVACHOL Take 40 mg by mouth every evening.   sucralfate 1 GM/10ML  suspension Commonly known as:  CARAFATE Take 10 mLs (1 g total) by mouth 4 (four) times daily -  with meals and at bedtime.   vitamin C 1000 MG tablet Take 1,000 mg by mouth daily.   VITAMIN D PO Take 1 capsule by mouth daily.      Follow-up Information    Philis Fendt, MD. Schedule an appointment as soon as possible for a visit in 1 week(s).   Specialty:  Internal Medicine Contact information: [place] [address] [phone]        Birdie Riddle, MD. Schedule an appointment as soon as possible for a visit in 1 week(s).   Specialty:  Cardiology Contact information: [place] [address] [phone]        [name], MD. Schedule an appointment as soon as possible for a visit in 5 day(s).   Specialty:  Oncology Contact information: [place] [address] [phone]           Signed: Birdie Riddle 06/19/2016, 5:51 PM

## 2016-06-19 NOTE — Progress Notes (Signed)
Physical Therapy Treatment Patient Details Name: Abigail [name] MRN: [id number] DOB: 10/12/1946 Today's Date: 06/19/2016    History of Present Illness Ms. Gaultney was admitted 06/12/2016 with exertional dyspnea. She has a history of anemia dating to October 2016. She has received multiple red cell transfusions this year. She reports feeling better for several weeks after each red cell transfusion.On 06/12/2016 the hemoglobin returned at 7, MCV 91.3, platelets 190,000, Khristin Keleher count 6.0, and the absolute neutral count returned at 3.4.  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      PT Comments    Pt admitted with above diagnosis. Pt currently with functional limitations due to balance and endurance deficits. Pt was able to ambulate in hallways with mostly steady gait.  Pt had one LOB needing steadying assist therefore continue to recommend RW for pt. Will continue balance activities with pt.  Pt will benefit from skilled PT to increase their independence and safety with mobility to allow discharge to the venue listed below.    Follow Up Recommendations  Home health PT;Supervision - Intermittent     Equipment Recommendations  None recommended by PT    Recommendations for Other Services       Precautions / Restrictions Precautions Precautions: Fall Restrictions Weight Bearing Restrictions: No    Mobility  Bed Mobility               General bed mobility comments: sitting up on EOB upon arrival  Transfers Overall transfer level: Needs assistance     Sit to Stand: Supervision         General transfer comment: good balance upon standing  Ambulation/Gait Ambulation/Gait assistance: Min guard Ambulation Distance (Feet): 250 Feet Assistive device: Rolling walker (2 wheeled) Gait Pattern/deviations: Step-through pattern;Decreased stride length;Drifts right/left   Gait velocity interpretation: Below normal speed for age/gender General Gait Details: Ambulated in hallway without AD with  tentative pattern.  One LOB needing steadying assist while pt was given min challenges to balance.  Pt states she is close to her baseline.  Still had that one LOB therefore will continue to follow pt.     Stairs            Wheelchair Mobility    Modified Rankin (Stroke Patients Only)       Balance Overall balance assessment: Needs assistance Sitting-balance support: No upper extremity supported;Feet supported Sitting balance-Leahy Scale: Fair     Standing balance support: No upper extremity supported;During functional activity Standing balance-Leahy Scale: Fair Standing balance comment: can stand statically without UE support for up a minute.             High level balance activites: Direction changes;Turns;Sudden stops;Head turns High Level Balance Comments: needs min guard assist for above challenges.   Still recommend RW.     Cognition Arousal/Alertness: Awake/alert Behavior During Therapy: WFL for tasks assessed/performed Overall Cognitive Status: Within Functional Limits for tasks assessed                      Exercises      General Comments General comments (skin integrity, edema, etc.): Scored 19/24 on DGI suggesting a risk of falls but low risk of falls.       Pertinent Vitals/Pain Pain Assessment: No/denies pain  VSS    Home Living                      Prior Function            PT Goals (current  goals can now be found in the care plan section) Acute Rehab PT Goals Patient Stated Goal: to go home Progress towards PT goals: Progressing toward goals    Frequency  Min 3X/week    PT Plan Current plan remains appropriate    Co-evaluation             End of Session Equipment Utilized During Treatment: Gait belt Activity Tolerance: Patient tolerated treatment well Patient left: with call bell/phone within reach;Other (comment);in bed (on EOB)     Time: 1030-1045 PT Time Calculation (min) (ACUTE ONLY): 15  min  Charges:  $Gait Training: 8-22 mins                    G CodesDenice Paradise 2016-06-21, 11:47 AM [facility] Acute Rehabilitation [phone] (pager)

## 2016-06-23 ENCOUNTER — Other Ambulatory Visit: Payer: Self-pay | Admitting: Oncology

## 2016-06-24 ENCOUNTER — Encounter: Payer: Self-pay | Admitting: *Deleted

## 2016-06-24 ENCOUNTER — Other Ambulatory Visit: Payer: Self-pay | Admitting: *Deleted

## 2016-06-24 LAB — CHROMOSOME ANALYSIS, BONE MARROW

## 2016-06-24 NOTE — Progress Notes (Signed)
Abigail [name] with pathology notified to add Myeloma FISH panel to cytogenetics from 06/16/16 per Dr. Benay Spice.

## 2016-06-26 ENCOUNTER — Ambulatory Visit (HOSPITAL_BASED_OUTPATIENT_CLINIC_OR_DEPARTMENT_OTHER): Payer: Medicare HMO | Admitting: Oncology

## 2016-06-26 ENCOUNTER — Other Ambulatory Visit (HOSPITAL_BASED_OUTPATIENT_CLINIC_OR_DEPARTMENT_OTHER): Payer: Medicare HMO

## 2016-06-26 ENCOUNTER — Other Ambulatory Visit: Payer: Self-pay | Admitting: *Deleted

## 2016-06-26 ENCOUNTER — Ambulatory Visit (HOSPITAL_BASED_OUTPATIENT_CLINIC_OR_DEPARTMENT_OTHER): Payer: Medicare HMO

## 2016-06-26 ENCOUNTER — Telehealth: Payer: Self-pay | Admitting: Oncology

## 2016-06-26 ENCOUNTER — Telehealth: Payer: Self-pay | Admitting: *Deleted

## 2016-06-26 VITALS — BP 153/66 | HR 72 | Temp 98.4°F | Resp 18 | Ht 65.5 in | Wt 162.2 lb

## 2016-06-26 DIAGNOSIS — C9000 Multiple myeloma not having achieved remission: Secondary | ICD-10-CM

## 2016-06-26 DIAGNOSIS — I2510 Atherosclerotic heart disease of native coronary artery without angina pectoris: Secondary | ICD-10-CM | POA: Diagnosis not present

## 2016-06-26 DIAGNOSIS — D630 Anemia in neoplastic disease: Secondary | ICD-10-CM

## 2016-06-26 DIAGNOSIS — Z5112 Encounter for antineoplastic immunotherapy: Secondary | ICD-10-CM

## 2016-06-26 LAB — CBC WITH DIFFERENTIAL/PLATELET
BASO%: 0.5 % (ref 0.0–2.0)
Basophils Absolute: 0 10*3/uL (ref 0.0–0.1)
EOS%: 2.9 % (ref 0.0–7.0)
Eosinophils Absolute: 0.2 10*3/uL (ref 0.0–0.5)
HEMATOCRIT: 33.3 % — AB (ref 34.8–46.6)
HGB: 10.5 g/dL — ABNORMAL LOW (ref 11.6–15.9)
LYMPH#: 1.5 10*3/uL (ref 0.9–3.3)
LYMPH%: 26.8 % (ref 14.0–49.7)
MCH: 29 pg (ref 25.1–34.0)
MCHC: 31.5 g/dL (ref 31.5–36.0)
MCV: 92 fL (ref 79.5–101.0)
MONO#: 0.6 10*3/uL (ref 0.1–0.9)
MONO%: 10.1 % (ref 0.0–14.0)
NEUT%: 59.7 % (ref 38.4–76.8)
NEUTROS ABS: 3.3 10*3/uL (ref 1.5–6.5)
PLATELETS: 237 10*3/uL (ref 145–400)
RBC: 3.62 10*6/uL — AB (ref 3.70–5.45)
RDW: 20.7 % — AB (ref 11.2–14.5)
WBC: 5.6 10*3/uL (ref 3.9–10.3)

## 2016-06-26 LAB — BASIC METABOLIC PANEL
Anion Gap: 2 mEq/L — ABNORMAL LOW (ref 3–11)
BUN: 16 mg/dL (ref 7.0–26.0)
CHLORIDE: 117 meq/L — AB (ref 98–109)
CO2: 15 mEq/L — ABNORMAL LOW (ref 22–29)
Calcium: 8.2 mg/dL — ABNORMAL LOW (ref 8.4–10.4)
Creatinine: 2.6 mg/dL — ABNORMAL HIGH (ref 0.6–1.1)
EGFR: 21 mL/min/{1.73_m2} — ABNORMAL LOW (ref >90)
Glucose: 87 mg/dl (ref 70–140)
POTASSIUM: 3.1 meq/L — AB (ref 3.5–5.1)
Sodium: 134 mEq/L — ABNORMAL LOW (ref 136–145)

## 2016-06-26 MED ORDER — BORTEZOMIB CHEMO SQ INJECTION 3.5 MG (2.5MG/ML)
1.3000 mg/m2 | Freq: Once | INTRAMUSCULAR | Status: AC
  Administered 2016-06-26: 2.5 mg via SUBCUTANEOUS
  Filled 2016-06-26: qty 2.5

## 2016-06-26 MED ORDER — ONDANSETRON HCL 8 MG PO TABS
8.0000 mg | ORAL_TABLET | Freq: Once | ORAL | Status: AC
  Administered 2016-06-26: 8 mg via ORAL

## 2016-06-26 MED ORDER — ONDANSETRON HCL 8 MG PO TABS: 8.0000 mg | ORAL_TABLET | Freq: Three times a day (TID) | ORAL | 0 refills | Status: DC | PRN

## 2016-06-26 MED ORDER — DEXAMETHASONE 4 MG PO TABS
ORAL_TABLET | ORAL | Status: AC
  Filled 2016-06-26: qty 10

## 2016-06-26 MED ORDER — ACYCLOVIR 400 MG PO TABS: 400.0000 mg | ORAL_TABLET | Freq: Two times a day (BID) | ORAL | 0 refills | Status: DC

## 2016-06-26 MED ORDER — DEXAMETHASONE 4 MG PO TABS: ORAL_TABLET | ORAL | 0 refills | Status: DC

## 2016-06-26 MED ORDER — ONDANSETRON HCL 8 MG PO TABS
ORAL_TABLET | ORAL | Status: AC
  Filled 2016-06-26: qty 1

## 2016-06-26 MED ORDER — DEXAMETHASONE 4 MG PO TABS
40.0000 mg | ORAL_TABLET | Freq: Once | ORAL | Status: AC
  Administered 2016-06-26: 40 mg via ORAL

## 2016-06-26 NOTE — Addendum Note (Signed)
Addended by: [place] Morelle on: 06/26/2016 11:13 AM   Modules accepted: Orders

## 2016-06-26 NOTE — Telephone Encounter (Signed)
Message sent to Chemo scheduler to add Chemo injection per md, per 06/26/16 los. Avs report and  appt schd given per  06/26/16 los.

## 2016-06-26 NOTE — Telephone Encounter (Signed)
Unable to reach pt to inform her of lab appt 9/8 per LOS. Called multiple times and phone just continued to ring and never went to voicemail.

## 2016-06-26 NOTE — Progress Notes (Signed)
  [facility] OFFICE PROGRESS NOTE   Diagnosis: Multiple myeloma  INTERVAL HISTORY:   I saw Ms. Boyack when she was hospitalized last week for severe anemia. She was diagnosed with multiple myeloma. She began treatment with Velcade/Decadron on 06/19/2016. She reports no apparent side effects from the Velcade or Decadron. Her energy level has improved compared to hospital admission. She has a "queasy "feeling in her stomach.  Objective:  Vital signs in last 24 hours:  Blood pressure (!) 153/66, pulse 72, temperature 98.4 F (36.9 C), temperature source Oral, resp. rate 18, height 5' 5.5" (1.664 m), weight 162 lb 3.2 oz (73.6 kg), SpO2 100 %.    HEENT: No thrush Resp: Lungs clear bilaterally Cardio: Regular rate and rhythm GI: No hepatosplenomegaly, nontender, soft Vascular: No leg edema   Lab Results:  Lab Results  Component Value Date   WBC 5.6 06/26/2016   HGB 10.5 (L) 06/26/2016   HCT 33.3 (L) 06/26/2016   MCV 92.0 06/26/2016   PLT 237 06/26/2016   NEUTROABS 3.3 06/26/2016     Medications: I have reviewed the patient's current medications.  Assessment/Plan: 1. Multiple myeloma, IgG lambda, bone marrow biopsy 06/15/2016 confirmed multiple myeloma  Elevated serum free lambda light chains  Lambda light chain proteinuria  Lytic bone lesions on a bone survey 06/13/2016  Initiation of weekly Velcade/Decadron 06/19/2016  2. Severe anemia secondary to #1  3. 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  History of coronary artery disease/myocardial infarction  5. History of colon polyps   Disposition:  Ms. Vandenbos has been diagnosed with multiple myeloma. I discussed treatment options with Ms. Buhman and her sister. The plan is to continue Velcade/Decadron for now. We will initiate the process to obtain Revlimid. The plan is to begin RVD therapy within the next few weeks.  I reviewed the potential  toxicities associated with Revlimid and she agrees to proceed. She will begin zoster and aspirin prophylaxis.  Ms. Homen will return for Velcade in one week. She will be scheduled for an office visit in 2 weeks.  [name], MD  06/26/2016  9:57 AM

## 2016-06-26 NOTE — Progress Notes (Signed)
Dr. Benay Spice aware of labs today, OK to treat today, patient made aware of medication changes (see discharge summary) and will return on 9/8 for repeat labs.

## 2016-06-26 NOTE — Telephone Encounter (Signed)
Per LOS I have scheduled appts and  Notified the scheduler

## 2016-06-26 NOTE — Addendum Note (Signed)
Addended by: [name] B on: 06/26/2016 10:41 AM   Modules accepted: Orders

## 2016-06-26 NOTE — Patient Instructions (Addendum)
Patient advised to STOP Lasix today per Dr. Benay Spice and still hold her Potassium pills as previously instructed to do. She is aware she will come here on 06/27/16 for repeat labs per Dr. Benay Spice. New medication list printed for patient to have.  She verbalizes understanding along with her sister.   [facility] Discharge Instructions for Patients Receiving Chemotherapy  Today you received the following chemotherapy agents Velcade.  To help prevent nausea and vomiting after your treatment, we encourage you to take your nausea medication as directed.  If you develop nausea and vomiting that is not controlled by your nausea medication, call the clinic.   BELOW ARE SYMPTOMS THAT SHOULD BE REPORTED IMMEDIATELY:  *FEVER GREATER THAN 100.5 F  *CHILLS WITH OR WITHOUT FEVER  NAUSEA AND VOMITING THAT IS NOT CONTROLLED WITH YOUR NAUSEA MEDICATION  *UNUSUAL SHORTNESS OF BREATH  *UNUSUAL BRUISING OR BLEEDING  TENDERNESS IN MOUTH AND THROAT WITH OR WITHOUT PRESENCE OF ULCERS  *URINARY PROBLEMS  *BOWEL PROBLEMS  UNUSUAL RASH Items with * indicate a potential emergency and should be followed up as soon as possible.  Feel free to call the clinic you have any questions or concerns. The clinic phone number is [phone].  Please show the [facility] at check-in to the Emergency Department and triage nurse.

## 2016-06-27 ENCOUNTER — Telehealth: Payer: Self-pay | Admitting: Oncology

## 2016-06-27 ENCOUNTER — Telehealth: Payer: Self-pay | Admitting: *Deleted

## 2016-06-27 ENCOUNTER — Other Ambulatory Visit (HOSPITAL_BASED_OUTPATIENT_CLINIC_OR_DEPARTMENT_OTHER): Payer: Medicare HMO

## 2016-06-27 DIAGNOSIS — C9000 Multiple myeloma not having achieved remission: Secondary | ICD-10-CM

## 2016-06-27 LAB — COMPREHENSIVE METABOLIC PANEL
ALK PHOS: 80 U/L (ref 40–150)
ALT: 18 U/L (ref 0–55)
AST: 40 U/L — AB (ref 5–34)
Albumin: 2.6 g/dL — ABNORMAL LOW (ref 3.5–5.0)
Anion Gap: 3 mEq/L (ref 3–11)
BILIRUBIN TOTAL: 0.33 mg/dL (ref 0.20–1.20)
BUN: 31.1 mg/dL — AB (ref 7.0–26.0)
CALCIUM: 8.6 mg/dL (ref 8.4–10.4)
CO2: 15 mEq/L — ABNORMAL LOW (ref 22–29)
CREATININE: 2.8 mg/dL — AB (ref 0.6–1.1)
Chloride: 114 mEq/L — ABNORMAL HIGH (ref 98–109)
EGFR: 19 mL/min/{1.73_m2} — ABNORMAL LOW (ref >90)
GLUCOSE: 98 mg/dL (ref 70–140)
Potassium: 2.9 mEq/L — CL (ref 3.5–5.1)
Sodium: 132 mEq/L — ABNORMAL LOW (ref 136–145)
Total Protein: 15.3 g/dL — ABNORMAL HIGH (ref 6.4–8.3)

## 2016-06-27 NOTE — Telephone Encounter (Signed)
CMET results faxed to Dr. Jeanie Cooks and Dr. Doylene Canard per Dr. Gearldine Shown request.

## 2016-06-27 NOTE — Telephone Encounter (Signed)
Per Dr. Benay Spice, pt notified to begin taking KCL 20 meq daily, to increase water intake, and to return to [facility] on Monday at 10:00AM for labs.  Pt verbalizes an understanding of all instructions and has no questions or concerns at this time.

## 2016-06-27 NOTE — Telephone Encounter (Signed)
Spoke with pt to confirm lab only appt 9/11 per LOS

## 2016-06-29 ENCOUNTER — Other Ambulatory Visit: Payer: Self-pay | Admitting: Oncology

## 2016-06-30 ENCOUNTER — Other Ambulatory Visit (HOSPITAL_BASED_OUTPATIENT_CLINIC_OR_DEPARTMENT_OTHER): Payer: Medicare HMO

## 2016-06-30 DIAGNOSIS — C9000 Multiple myeloma not having achieved remission: Secondary | ICD-10-CM | POA: Diagnosis not present

## 2016-06-30 LAB — CBC WITH DIFFERENTIAL/PLATELET
BASO%: 0.2 % (ref 0.0–2.0)
Basophils Absolute: 0 10*3/uL (ref 0.0–0.1)
EOS%: 0.7 % (ref 0.0–7.0)
Eosinophils Absolute: 0 10*3/uL (ref 0.0–0.5)
HCT: 35.6 % (ref 34.8–46.6)
HGB: 11.4 g/dL — ABNORMAL LOW (ref 11.6–15.9)
LYMPH%: 29.5 % (ref 14.0–49.7)
MCH: 28.6 pg (ref 25.1–34.0)
MCHC: 32 g/dL (ref 31.5–36.0)
MCV: 89.2 fL (ref 79.5–101.0)
MONO#: 0.4 10*3/uL (ref 0.1–0.9)
MONO%: 8.3 % (ref 0.0–14.0)
NEUT%: 61.3 % (ref 38.4–76.8)
NEUTROS ABS: 2.8 10*3/uL (ref 1.5–6.5)
NRBC: 0 % (ref 0–0)
PLATELETS: 202 10*3/uL (ref 145–400)
RBC: 3.99 10*6/uL (ref 3.70–5.45)
RDW: 20.5 % — AB (ref 11.2–14.5)
WBC: 4.6 10*3/uL (ref 3.9–10.3)
lymph#: 1.4 10*3/uL (ref 0.9–3.3)

## 2016-06-30 LAB — COMPREHENSIVE METABOLIC PANEL
ALT: 17 U/L (ref 0–55)
ANION GAP: 3 meq/L (ref 3–11)
AST: 19 U/L (ref 5–34)
Albumin: 2.5 g/dL — ABNORMAL LOW (ref 3.5–5.0)
Alkaline Phosphatase: 87 U/L (ref 40–150)
BILIRUBIN TOTAL: 0.53 mg/dL (ref 0.20–1.20)
BUN: 22.8 mg/dL (ref 7.0–26.0)
CHLORIDE: 118 meq/L — AB (ref 98–109)
CO2: 14 meq/L — AB (ref 22–29)
Calcium: 8.2 mg/dL — ABNORMAL LOW (ref 8.4–10.4)
Creatinine: 1.9 mg/dL — ABNORMAL HIGH (ref 0.6–1.1)
EGFR: 31 mL/min/{1.73_m2} — AB (ref >90)
GLUCOSE: 86 mg/dL (ref 70–140)
Potassium: 3.1 mEq/L — ABNORMAL LOW (ref 3.5–5.1)
SODIUM: 135 meq/L — AB (ref 136–145)
TOTAL PROTEIN: 13.3 g/dL — AB (ref 6.4–8.3)

## 2016-07-01 ENCOUNTER — Telehealth: Payer: Self-pay | Admitting: *Deleted

## 2016-07-01 ENCOUNTER — Encounter (HOSPITAL_COMMUNITY): Payer: Self-pay

## 2016-07-01 DIAGNOSIS — C9000 Multiple myeloma not having achieved remission: Secondary | ICD-10-CM

## 2016-07-01 LAB — TISSUE HYBRIDIZATION (BONE MARROW)-NCBH

## 2016-07-01 NOTE — Telephone Encounter (Signed)
-----   Message from Ladell Pier, MD sent at 06/30/2016  4:17 PM EDT ----- Please call patient, renal function is better, f/u as scheduled for velcade this week, continue potassium, bmet and cbc 9/14

## 2016-07-01 NOTE — Telephone Encounter (Signed)
Called pt with lab results per Dr. Benay Spice. Instructed her to expect call from schedulers with lab appt 9/14 prior to Velcade. Pt confirms she is taking potassium 60meq BID.

## 2016-07-03 ENCOUNTER — Other Ambulatory Visit (HOSPITAL_BASED_OUTPATIENT_CLINIC_OR_DEPARTMENT_OTHER): Payer: Medicare HMO

## 2016-07-03 ENCOUNTER — Ambulatory Visit (HOSPITAL_BASED_OUTPATIENT_CLINIC_OR_DEPARTMENT_OTHER): Payer: Medicare HMO

## 2016-07-03 VITALS — BP 130/80 | HR 83 | Temp 98.8°F | Resp 18

## 2016-07-03 DIAGNOSIS — Z5112 Encounter for antineoplastic immunotherapy: Secondary | ICD-10-CM | POA: Diagnosis not present

## 2016-07-03 DIAGNOSIS — C9000 Multiple myeloma not having achieved remission: Secondary | ICD-10-CM

## 2016-07-03 LAB — CBC WITH DIFFERENTIAL/PLATELET
BASO%: 0.3 % (ref 0.0–2.0)
Basophils Absolute: 0 10*3/uL (ref 0.0–0.1)
EOS%: 0.3 % (ref 0.0–7.0)
Eosinophils Absolute: 0 10*3/uL (ref 0.0–0.5)
HCT: 36.1 % (ref 34.8–46.6)
HGB: 12.2 g/dL (ref 11.6–15.9)
LYMPH%: 5.6 % — AB (ref 14.0–49.7)
MCH: 29.8 pg (ref 25.1–34.0)
MCHC: 33.6 g/dL (ref 31.5–36.0)
MCV: 88.6 fL (ref 79.5–101.0)
MONO#: 0.1 10*3/uL (ref 0.1–0.9)
MONO%: 1.1 % (ref 0.0–14.0)
NEUT%: 92.7 % — ABNORMAL HIGH (ref 38.4–76.8)
NEUTROS ABS: 7.3 10*3/uL — AB (ref 1.5–6.5)
PLATELETS: 198 10*3/uL (ref 145–400)
RBC: 4.08 10*6/uL (ref 3.70–5.45)
RDW: 20.7 % — AB (ref 11.2–14.5)
WBC: 7.8 10*3/uL (ref 3.9–10.3)
lymph#: 0.4 10*3/uL — ABNORMAL LOW (ref 0.9–3.3)

## 2016-07-03 LAB — BASIC METABOLIC PANEL
Anion Gap: 4 mEq/L (ref 3–11)
BUN: 18.8 mg/dL (ref 7.0–26.0)
CALCIUM: 8.5 mg/dL (ref 8.4–10.4)
CHLORIDE: 117 meq/L — AB (ref 98–109)
CO2: 15 meq/L — AB (ref 22–29)
CREATININE: 1.7 mg/dL — AB (ref 0.6–1.1)
EGFR: 35 mL/min/{1.73_m2} — ABNORMAL LOW (ref >90)
GLUCOSE: 122 mg/dL (ref 70–140)
Potassium: 3.2 mEq/L — ABNORMAL LOW (ref 3.5–5.1)
SODIUM: 136 meq/L (ref 136–145)

## 2016-07-03 MED ORDER — ONDANSETRON HCL 8 MG PO TABS
ORAL_TABLET | ORAL | Status: AC
  Filled 2016-07-03: qty 1

## 2016-07-03 MED ORDER — ONDANSETRON HCL 8 MG PO TABS
8.0000 mg | ORAL_TABLET | Freq: Once | ORAL | Status: AC
  Administered 2016-07-03: 8 mg via ORAL

## 2016-07-03 MED ORDER — BORTEZOMIB CHEMO SQ INJECTION 3.5 MG (2.5MG/ML)
1.3000 mg/m2 | Freq: Once | INTRAMUSCULAR | Status: AC
  Administered 2016-07-03: 2.5 mg via SUBCUTANEOUS
  Filled 2016-07-03: qty 1

## 2016-07-03 NOTE — Patient Instructions (Signed)
Bortezomib injection What is this medicine? BORTEZOMIB (bor TEZ oh mib) is a medicine that targets proteins in cancer cells and stops the cancer cells from growing. It is used to treat multiple myeloma and mantle-cell lymphoma. This medicine may be used for other purposes; ask your health care provider or pharmacist if you have questions. What should I tell my health care provider before I take this medicine? They need to know if you have any of these conditions: -diabetes -heart disease -irregular heartbeat -liver disease -on hemodialysis -low blood counts, like low white blood cells, platelets, or hemoglobin -peripheral neuropathy -taking medicine for blood pressure -an unusual or allergic reaction to bortezomib, mannitol, boron, other medicines, foods, dyes, or preservatives -pregnant or trying to get pregnant -breast-feeding How should I use this medicine? This medicine is for injection into a vein or for injection under the skin. It is given by a health care professional in a hospital or clinic setting. Talk to your pediatrician regarding the use of this medicine in children. Special care may be needed. Overdosage: If you think you have taken too much of this medicine contact a poison control center or emergency room at once. NOTE: This medicine is only for you. Do not share this medicine with others. What if I miss a dose? It is important not to miss your dose. Call your doctor or health care professional if you are unable to keep an appointment. What may interact with this medicine? This medicine may interact with the following medications: -ketoconazole -rifampin -ritonavir -St. John's Wort This list may not describe all possible interactions. Give your health care provider a list of all the medicines, herbs, non-prescription drugs, or dietary supplements you use. Also tell them if you smoke, drink alcohol, or use illegal drugs. Some items may interact with your medicine. What  should I watch for while using this medicine? Visit your doctor for checks on your progress. This drug may make you feel generally unwell. This is not uncommon, as chemotherapy can affect healthy cells as well as cancer cells. Report any side effects. Continue your course of treatment even though you feel ill unless your doctor tells you to stop. You may get drowsy or dizzy. Do not drive, use machinery, or do anything that needs mental alertness until you know how this medicine affects you. Do not stand or sit up quickly, especially if you are an older patient. This reduces the risk of dizzy or fainting spells. In some cases, you may be given additional medicines to help with side effects. Follow all directions for their use. Call your doctor or health care professional for advice if you get a fever, chills or sore throat, or other symptoms of a cold or flu. Do not treat yourself. This drug decreases your body's ability to fight infections. Try to avoid being around people who are sick. This medicine may increase your risk to bruise or bleed. Call your doctor or health care professional if you notice any unusual bleeding. You may need blood work done while you are taking this medicine. In some patients, this medicine may cause a serious brain infection that may cause death. If you have any problems seeing, thinking, speaking, walking, or standing, tell your doctor right away. If you cannot reach your doctor, urgently seek other source of medical care. Do not become pregnant while taking this medicine. Women should inform their doctor if they wish to become pregnant or think they might be pregnant. There is a potential for serious  side effects to an unborn child. Talk to your health care professional or pharmacist for more information. Do not breast-feed an infant while taking this medicine. Check with your doctor or health care professional if you get an attack of severe diarrhea, nausea and vomiting, or if  you sweat a lot. The loss of too much body fluid can make it dangerous for you to take this medicine. What side effects may I notice from receiving this medicine? Side effects that you should report to your doctor or health care professional as soon as possible: -allergic reactions like skin rash, itching or hives, swelling of the face, lips, or tongue -breathing problems -changes in hearing -changes in vision -fast, irregular heartbeat -feeling faint or lightheaded, falls -pain, tingling, numbness in the hands or feet -right upper belly pain -seizures -swelling of the ankles, feet, hands -unusual bleeding or bruising -unusually weak or tired -vomiting -yellowing of the eyes or skin Side effects that usually do not require medical attention (report to your doctor or health care professional if they continue or are bothersome): -changes in emotions or moods -constipation -diarrhea -loss of appetite -headache -irritation at site where injected -nausea This list may not describe all possible side effects. Call your doctor for medical advice about side effects. You may report side effects to FDA at 1-800-FDA-1088. Where should I keep my medicine? This drug is given in a hospital or clinic and will not be stored at home. NOTE: This sheet is a summary. It may not cover all possible information. If you have questions about this medicine, talk to your doctor, pharmacist, or health care provider.    2016, [employer]vier/Gold Standard. (2014-12-05 14:47:04)

## 2016-07-06 ENCOUNTER — Other Ambulatory Visit: Payer: Self-pay | Admitting: Oncology

## 2016-07-10 ENCOUNTER — Ambulatory Visit (HOSPITAL_BASED_OUTPATIENT_CLINIC_OR_DEPARTMENT_OTHER): Payer: Medicare HMO

## 2016-07-10 ENCOUNTER — Telehealth: Payer: Self-pay | Admitting: Nurse Practitioner

## 2016-07-10 ENCOUNTER — Ambulatory Visit (HOSPITAL_BASED_OUTPATIENT_CLINIC_OR_DEPARTMENT_OTHER): Payer: Medicare HMO | Admitting: Nurse Practitioner

## 2016-07-10 VITALS — BP 142/76 | HR 71 | Temp 99.1°F | Resp 16 | Ht 65.5 in | Wt 161.0 lb

## 2016-07-10 DIAGNOSIS — C9000 Multiple myeloma not having achieved remission: Secondary | ICD-10-CM | POA: Diagnosis not present

## 2016-07-10 DIAGNOSIS — Z5112 Encounter for antineoplastic immunotherapy: Secondary | ICD-10-CM | POA: Diagnosis not present

## 2016-07-10 DIAGNOSIS — D630 Anemia in neoplastic disease: Secondary | ICD-10-CM

## 2016-07-10 LAB — CBC WITH DIFFERENTIAL/PLATELET
BASO%: 0.2 % (ref 0.0–2.0)
BASOS ABS: 0 10*3/uL (ref 0.0–0.1)
EOS ABS: 0 10*3/uL (ref 0.0–0.5)
EOS%: 0.1 % (ref 0.0–7.0)
HEMATOCRIT: 35.7 % (ref 34.8–46.6)
HEMOGLOBIN: 11.9 g/dL (ref 11.6–15.9)
LYMPH#: 0.4 10*3/uL — AB (ref 0.9–3.3)
LYMPH%: 5.6 % — ABNORMAL LOW (ref 14.0–49.7)
MCH: 29.4 pg (ref 25.1–34.0)
MCHC: 33.3 g/dL (ref 31.5–36.0)
MCV: 88.2 fL (ref 79.5–101.0)
MONO#: 0.1 10*3/uL (ref 0.1–0.9)
MONO%: 1.2 % (ref 0.0–14.0)
NEUT#: 6.9 10*3/uL — ABNORMAL HIGH (ref 1.5–6.5)
NEUT%: 92.9 % — AB (ref 38.4–76.8)
PLATELETS: 185 10*3/uL (ref 145–400)
RBC: 4.05 10*6/uL (ref 3.70–5.45)
RDW: 20.5 % — ABNORMAL HIGH (ref 11.2–14.5)
WBC: 7.4 10*3/uL (ref 3.9–10.3)

## 2016-07-10 LAB — BASIC METABOLIC PANEL
ANION GAP: 7 meq/L (ref 3–11)
BUN: 16.9 mg/dL (ref 7.0–26.0)
CALCIUM: 8.9 mg/dL (ref 8.4–10.4)
CO2: 18 meq/L — AB (ref 22–29)
CREATININE: 1.2 mg/dL — AB (ref 0.6–1.1)
Chloride: 113 mEq/L — ABNORMAL HIGH (ref 98–109)
EGFR: 52 mL/min/{1.73_m2} — ABNORMAL LOW (ref >90)
GLUCOSE: 115 mg/dL (ref 70–140)
Potassium: 3.3 mEq/L — ABNORMAL LOW (ref 3.5–5.1)
Sodium: 138 mEq/L (ref 136–145)

## 2016-07-10 MED ORDER — ONDANSETRON HCL 8 MG PO TABS: 8.0000 mg | ORAL_TABLET | Freq: Once | ORAL | Status: DC

## 2016-07-10 MED ORDER — BORTEZOMIB CHEMO SQ INJECTION 3.5 MG (2.5MG/ML)
1.3000 mg/m2 | Freq: Once | INTRAMUSCULAR | Status: AC
  Administered 2016-07-10: 2.5 mg via SUBCUTANEOUS
  Filled 2016-07-10: qty 2.5

## 2016-07-10 NOTE — Patient Instructions (Signed)
Bortezomib injection What is this medicine? BORTEZOMIB (bor TEZ oh mib) is a medicine that targets proteins in cancer cells and stops the cancer cells from growing. It is used to treat multiple myeloma and mantle-cell lymphoma. This medicine may be used for other purposes; ask your health care provider or pharmacist if you have questions. What should I tell my health care provider before I take this medicine? They need to know if you have any of these conditions: -diabetes -heart disease -irregular heartbeat -liver disease -on hemodialysis -low blood counts, like low white blood cells, platelets, or hemoglobin -peripheral neuropathy -taking medicine for blood pressure -an unusual or allergic reaction to bortezomib, mannitol, boron, other medicines, foods, dyes, or preservatives -pregnant or trying to get pregnant -breast-feeding How should I use this medicine? This medicine is for injection into a vein or for injection under the skin. It is given by a health care professional in a hospital or clinic setting. Talk to your pediatrician regarding the use of this medicine in children. Special care may be needed. Overdosage: If you think you have taken too much of this medicine contact a poison control center or emergency room at once. NOTE: This medicine is only for you. Do not share this medicine with others. What if I miss a dose? It is important not to miss your dose. Call your doctor or health care professional if you are unable to keep an appointment. What may interact with this medicine? This medicine may interact with the following medications: -ketoconazole -rifampin -ritonavir -St. John's Wort This list may not describe all possible interactions. Give your health care provider a list of all the medicines, herbs, non-prescription drugs, or dietary supplements you use. Also tell them if you smoke, drink alcohol, or use illegal drugs. Some items may interact with your medicine. What  should I watch for while using this medicine? Visit your doctor for checks on your progress. This drug may make you feel generally unwell. This is not uncommon, as chemotherapy can affect healthy cells as well as cancer cells. Report any side effects. Continue your course of treatment even though you feel ill unless your doctor tells you to stop. You may get drowsy or dizzy. Do not drive, use machinery, or do anything that needs mental alertness until you know how this medicine affects you. Do not stand or sit up quickly, especially if you are an older patient. This reduces the risk of dizzy or fainting spells. In some cases, you may be given additional medicines to help with side effects. Follow all directions for their use. Call your doctor or health care professional for advice if you get a fever, chills or sore throat, or other symptoms of a cold or flu. Do not treat yourself. This drug decreases your body's ability to fight infections. Try to avoid being around people who are sick. This medicine may increase your risk to bruise or bleed. Call your doctor or health care professional if you notice any unusual bleeding. You may need blood work done while you are taking this medicine. In some patients, this medicine may cause a serious brain infection that may cause death. If you have any problems seeing, thinking, speaking, walking, or standing, tell your doctor right away. If you cannot reach your doctor, urgently seek other source of medical care. Do not become pregnant while taking this medicine. Women should inform their doctor if they wish to become pregnant or think they might be pregnant. There is a potential for serious  side effects to an unborn child. Talk to your health care professional or pharmacist for more information. Do not breast-feed an infant while taking this medicine. Check with your doctor or health care professional if you get an attack of severe diarrhea, nausea and vomiting, or if  you sweat a lot. The loss of too much body fluid can make it dangerous for you to take this medicine. What side effects may I notice from receiving this medicine? Side effects that you should report to your doctor or health care professional as soon as possible: -allergic reactions like skin rash, itching or hives, swelling of the face, lips, or tongue -breathing problems -changes in hearing -changes in vision -fast, irregular heartbeat -feeling faint or lightheaded, falls -pain, tingling, numbness in the hands or feet -right upper belly pain -seizures -swelling of the ankles, feet, hands -unusual bleeding or bruising -unusually weak or tired -vomiting -yellowing of the eyes or skin Side effects that usually do not require medical attention (report to your doctor or health care professional if they continue or are bothersome): -changes in emotions or moods -constipation -diarrhea -loss of appetite -headache -irritation at site where injected -nausea This list may not describe all possible side effects. Call your doctor for medical advice about side effects. You may report side effects to FDA at 1-800-FDA-1088. Where should I keep my medicine? This drug is given in a hospital or clinic and will not be stored at home. NOTE: This sheet is a summary. It may not cover all possible information. If you have questions about this medicine, talk to your doctor, pharmacist, or health care provider.    2016, [employer]vier/Gold Standard. (2014-12-05 14:47:04)

## 2016-07-10 NOTE — Telephone Encounter (Signed)
Message sent to infusion scheduler to add chemo. Avs report and appointment schedule given to patient, per 07/10/16 los.

## 2016-07-10 NOTE — Progress Notes (Signed)
  [facility] OFFICE PROGRESS NOTE   Diagnosis:  Multiple myeloma  INTERVAL HISTORY:   Abigail [name] returns as scheduled. She continues weekly Velcade/Decadron. She denies nausea/vomiting. No mouth sores. No diarrhea.  Objective:  Vital signs in last 24 hours:  Blood pressure (!) 142/76, pulse 71, temperature 99.1 F (37.3 C), temperature source Oral, resp. rate 16, height 5' 5.5" (1.664 m), weight 161 lb (73 kg), SpO2 99 %.    HEENT: Mild white coating over tongue. Resp: Lungs clear bilaterally. Cardio: Regular rate and rhythm. GI: Abdomen soft and nontender. No organomegaly. Vascular: No leg edema. Calves soft and nontender.    Lab Results:  Lab Results  Component Value Date   WBC 7.8 07/03/2016   HGB 12.2 07/03/2016   HCT 36.1 07/03/2016   MCV 88.6 07/03/2016   PLT 198 07/03/2016   NEUTROABS 7.3 (H) 07/03/2016    Imaging:  No results found.  Medications: I have reviewed the patient's current medications.  Assessment/Plan: 1. Multiple myeloma, IgG lambda, bone marrow biopsy 06/15/2016 confirmed multiple myeloma  Elevated serum free lambda light chains  Lambda light chain proteinuria  Lytic bone lesions on a bone survey 06/13/2016  Initiation of weekly Velcade/Decadron 06/19/2016  2. Severe anemia secondary to #1  3. 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 History of coronary artery disease/myocardial infarction  5. History of colon polyps   Disposition: Abigail [name] appears stable. The plan is to continue weekly Velcade/Decadron. We will consider adding Revlimid with further improvement in kidney function. Labs from today pending.  She will return for a follow-up visit in 2 weeks.  Plan reviewed with Dr. Benay Spice.    Abigail [name] ANP/GNP-BC   07/10/2016  2:13 PM

## 2016-07-11 ENCOUNTER — Telehealth: Payer: Self-pay | Admitting: *Deleted

## 2016-07-11 NOTE — Telephone Encounter (Signed)
Per LOS I have scheduled appts and notified the scheduler 

## 2016-07-13 ENCOUNTER — Other Ambulatory Visit: Payer: Self-pay | Admitting: Oncology

## 2016-07-17 ENCOUNTER — Other Ambulatory Visit (HOSPITAL_BASED_OUTPATIENT_CLINIC_OR_DEPARTMENT_OTHER): Payer: Medicare HMO

## 2016-07-17 ENCOUNTER — Ambulatory Visit (HOSPITAL_BASED_OUTPATIENT_CLINIC_OR_DEPARTMENT_OTHER): Payer: Medicare HMO

## 2016-07-17 VITALS — BP 134/99 | HR 16 | Temp 98.5°F

## 2016-07-17 DIAGNOSIS — C9000 Multiple myeloma not having achieved remission: Secondary | ICD-10-CM

## 2016-07-17 DIAGNOSIS — Z5112 Encounter for antineoplastic immunotherapy: Secondary | ICD-10-CM

## 2016-07-17 LAB — CBC WITH DIFFERENTIAL/PLATELET
BASO%: 0.2 % (ref 0.0–2.0)
Basophils Absolute: 0 10*3/uL (ref 0.0–0.1)
EOS%: 0 % (ref 0.0–7.0)
Eosinophils Absolute: 0 10*3/uL (ref 0.0–0.5)
HCT: 37.1 % (ref 34.8–46.6)
HGB: 12 g/dL (ref 11.6–15.9)
LYMPH%: 9.6 % — AB (ref 14.0–49.7)
MCH: 29.2 pg (ref 25.1–34.0)
MCHC: 32.3 g/dL (ref 31.5–36.0)
MCV: 90.3 fL (ref 79.5–101.0)
MONO#: 0.1 10*3/uL (ref 0.1–0.9)
MONO%: 1.9 % (ref 0.0–14.0)
NEUT%: 88.3 % — AB (ref 38.4–76.8)
NEUTROS ABS: 4.2 10*3/uL (ref 1.5–6.5)
NRBC: 0 % (ref 0–0)
PLATELETS: 201 10*3/uL (ref 145–400)
RBC: 4.11 10*6/uL (ref 3.70–5.45)
RDW: 19.6 % — ABNORMAL HIGH (ref 11.2–14.5)
WBC: 4.8 10*3/uL (ref 3.9–10.3)
lymph#: 0.5 10*3/uL — ABNORMAL LOW (ref 0.9–3.3)

## 2016-07-17 LAB — BASIC METABOLIC PANEL
ANION GAP: 9 meq/L (ref 3–11)
BUN: 13 mg/dL (ref 7.0–26.0)
CHLORIDE: 113 meq/L — AB (ref 98–109)
CO2: 18 mEq/L — ABNORMAL LOW (ref 22–29)
Calcium: 8.7 mg/dL (ref 8.4–10.4)
Creatinine: 1 mg/dL (ref 0.6–1.1)
EGFR: 63 mL/min/{1.73_m2} — AB (ref >90)
Glucose: 129 mg/dl (ref 70–140)
POTASSIUM: 3.9 meq/L (ref 3.5–5.1)
SODIUM: 140 meq/L (ref 136–145)

## 2016-07-17 MED ORDER — BORTEZOMIB CHEMO SQ INJECTION 3.5 MG (2.5MG/ML)
1.3000 mg/m2 | Freq: Once | INTRAMUSCULAR | Status: AC
  Administered 2016-07-17: 2.5 mg via SUBCUTANEOUS
  Filled 2016-07-17: qty 2.5

## 2016-07-17 MED ORDER — ONDANSETRON HCL 8 MG PO TABS
ORAL_TABLET | ORAL | Status: AC
  Filled 2016-07-17: qty 1

## 2016-07-17 MED ORDER — ONDANSETRON HCL 8 MG PO TABS
8.0000 mg | ORAL_TABLET | Freq: Once | ORAL | Status: DC
Start: 2016-07-17 — End: 2016-07-17

## 2016-07-17 NOTE — Progress Notes (Signed)
Pt heart rate fluctuating 54 - 109.  Palpated pulse - irregular.  Pt reports she will call her cardiologist and make an appt.  Pt BP 134/99.  Pt reports she takes her BP medication at night, and she does check her blood pressure at home.  MD notified.

## 2016-07-20 ENCOUNTER — Other Ambulatory Visit: Payer: Self-pay | Admitting: Oncology

## 2016-07-22 ENCOUNTER — Encounter (HOSPITAL_COMMUNITY): Payer: Self-pay

## 2016-07-24 ENCOUNTER — Other Ambulatory Visit (HOSPITAL_BASED_OUTPATIENT_CLINIC_OR_DEPARTMENT_OTHER): Payer: Medicare HMO

## 2016-07-24 ENCOUNTER — Ambulatory Visit (HOSPITAL_BASED_OUTPATIENT_CLINIC_OR_DEPARTMENT_OTHER): Payer: Medicare HMO | Admitting: Nurse Practitioner

## 2016-07-24 ENCOUNTER — Telehealth: Payer: Self-pay

## 2016-07-24 ENCOUNTER — Ambulatory Visit (HOSPITAL_BASED_OUTPATIENT_CLINIC_OR_DEPARTMENT_OTHER): Payer: Medicare HMO

## 2016-07-24 VITALS — BP 141/99 | HR 72 | Temp 98.4°F | Resp 18 | Ht 65.5 in | Wt 165.0 lb

## 2016-07-24 DIAGNOSIS — C9000 Multiple myeloma not having achieved remission: Secondary | ICD-10-CM

## 2016-07-24 DIAGNOSIS — Z5112 Encounter for antineoplastic immunotherapy: Secondary | ICD-10-CM | POA: Diagnosis not present

## 2016-07-24 DIAGNOSIS — D630 Anemia in neoplastic disease: Secondary | ICD-10-CM

## 2016-07-24 LAB — BASIC METABOLIC PANEL
ANION GAP: 8 meq/L (ref 3–11)
BUN: 12.9 mg/dL (ref 7.0–26.0)
CO2: 19 mEq/L — ABNORMAL LOW (ref 22–29)
CREATININE: 1 mg/dL (ref 0.6–1.1)
Calcium: 8.9 mg/dL (ref 8.4–10.4)
Chloride: 112 mEq/L — ABNORMAL HIGH (ref 98–109)
EGFR: 68 mL/min/{1.73_m2} — ABNORMAL LOW (ref >90)
Glucose: 116 mg/dl (ref 70–140)
Potassium: 4.6 mEq/L (ref 3.5–5.1)
Sodium: 139 mEq/L (ref 136–145)

## 2016-07-24 LAB — CBC WITH DIFFERENTIAL/PLATELET
BASO%: 0.1 % (ref 0.0–2.0)
Basophils Absolute: 0 10*3/uL (ref 0.0–0.1)
EOS%: 0.1 % (ref 0.0–7.0)
Eosinophils Absolute: 0 10*3/uL (ref 0.0–0.5)
HCT: 38 % (ref 34.8–46.6)
HEMOGLOBIN: 12 g/dL (ref 11.6–15.9)
LYMPH%: 8 % — AB (ref 14.0–49.7)
MCH: 29.1 pg (ref 25.1–34.0)
MCHC: 31.6 g/dL (ref 31.5–36.0)
MCV: 92 fL (ref 79.5–101.0)
MONO#: 0.2 10*3/uL (ref 0.1–0.9)
MONO%: 2.5 % (ref 0.0–14.0)
NEUT%: 89.3 % — ABNORMAL HIGH (ref 38.4–76.8)
NEUTROS ABS: 6.7 10*3/uL — AB (ref 1.5–6.5)
PLATELETS: 212 10*3/uL (ref 145–400)
RBC: 4.13 10*6/uL (ref 3.70–5.45)
RDW: 19.6 % — AB (ref 11.2–14.5)
WBC: 7.5 10*3/uL (ref 3.9–10.3)
lymph#: 0.6 10*3/uL — ABNORMAL LOW (ref 0.9–3.3)

## 2016-07-24 MED ORDER — BORTEZOMIB CHEMO SQ INJECTION 3.5 MG (2.5MG/ML)
1.3000 mg/m2 | Freq: Once | INTRAMUSCULAR | Status: AC
  Administered 2016-07-24: 2.5 mg via SUBCUTANEOUS
  Filled 2016-07-24: qty 2.5

## 2016-07-24 MED ORDER — ONDANSETRON HCL 8 MG PO TABS
ORAL_TABLET | ORAL | Status: AC
  Filled 2016-07-24: qty 1

## 2016-07-24 MED ORDER — ONDANSETRON HCL 8 MG PO TABS: 8.0000 mg | ORAL_TABLET | Freq: Once | ORAL | Status: DC

## 2016-07-24 MED ORDER — DEXAMETHASONE 4 MG PO TABS: ORAL_TABLET | ORAL | 2 refills | Status: DC

## 2016-07-24 NOTE — Telephone Encounter (Signed)
Appointments made,avs and calendar printed for patient.  HIM will take care of [facility] referral  Abigail [name]

## 2016-07-24 NOTE — Patient Instructions (Addendum)
[facility] Discharge Instructions for Patients Receiving Chemotherapy  Today you received the following chemotherapy agents VELCADE To help prevent nausea and vomiting after your treatment, we encourage you to take your nausea medication as prescribed.  If you develop nausea and vomiting that is not controlled by your nausea medication, call the clinic.   BELOW ARE SYMPTOMS THAT SHOULD BE REPORTED IMMEDIATELY:  *FEVER GREATER THAN 100.5 F  *CHILLS WITH OR WITHOUT FEVER  NAUSEA AND VOMITING THAT IS NOT CONTROLLED WITH YOUR NAUSEA MEDICATION  *UNUSUAL SHORTNESS OF BREATH  *UNUSUAL BRUISING OR BLEEDING  TENDERNESS IN MOUTH AND THROAT WITH OR WITHOUT PRESENCE OF ULCERS  *URINARY PROBLEMS  *BOWEL PROBLEMS  UNUSUAL RASH Items with * indicate a potential emergency and should be followed up as soon as possible.  Feel free to call the clinic should you have any questions or concerns. The clinic phone number is [phone].  Please show the CHEMO ALERT CARD at check-in to the Emergency Department and triage nurse.   

## 2016-07-24 NOTE — Progress Notes (Addendum)
  [place] OFFICE PROGRESS NOTE   Diagnosis:  Multiple myeloma  INTERVAL HISTORY:   Abigail [name] returns as scheduled. She continues weekly Velcade/Decadron. She overall feels well. She denies nausea/vomiting. No mouth sores. No diarrhea.  Objective:  Vital signs in last 24 hours:  Blood pressure (!) 141/99, pulse 72, temperature 98.4 F (36.9 C), temperature source Oral, resp. rate 18, height 5' 5.5" (1.664 m), weight 165 lb (74.8 kg), SpO2 99 %.    HEENT: No thrush or ulcers. Resp: Lungs clear bilaterally. Cardio: Regular rate and rhythm. GI: Abdomen soft and nontender. No organomegaly. Vascular: No leg edema.   Lab Results:  Lab Results  Component Value Date   WBC 7.5 07/24/2016   HGB 12.0 07/24/2016   HCT 38.0 07/24/2016   MCV 92.0 07/24/2016   PLT 212 07/24/2016   NEUTROABS 6.7 (H) 07/24/2016    Imaging:  No results found.  Medications: I have reviewed the patient's current medications.  Assessment/Plan: 1. 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 Severe anemia secondary to #1  3. 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 History of coronary artery disease/myocardial infarction  5. History of colon polyps   Disposition:Abigail [name] appears stable. She has completed 5 weekly treatments with Velcade/Decadron. She is no longer requiring red cell transfusion support. Hemoglobin is in normal range. Creatinine has normalized as well.  Dr. Benay Spice recommends adding Revlimid 14 days on/7 days off. We reviewed potential toxicities associated with Revlimid including myelosuppression, nausea, diarrhea or  constipation, neuropathy, increased risk of blood clots. She currently takes aspirin 81 mg daily and will continue the same. In addition, Dr. Benay Spice discussed a referral for consideration of stem cell therapy. Abigail [name] is in agreement with the plan as outlined above. We anticipate she will begin Revlimid on 07/31/2016. We made a referral to Dr. Ok Edwards at [facility].  She will return for a follow-up visit in 3 weeks. She will contact the office in the interim with any problems.  Patient seen with Dr. Benay Spice. 25 minutes were spent face-to-face at today's visit with the majority of that time involved in counseling/coordination of care.    [name] ANP/GNP-BC   07/24/2016  3:21 PM This was a shared visit with [name]. Abigail [name] is having a clinical response to the Velcade/Decadron. The renal insufficiency has improved. Revlimid will be added to the systemic therapy regimen. We reviewed the potential toxicities associated with Revlimid and she agrees to proceed.  We will refer her to [facility] to consider the indication for autologous stem cell therapy.  Julieanne Manson, M.D.

## 2016-07-27 ENCOUNTER — Other Ambulatory Visit: Payer: Self-pay | Admitting: Oncology

## 2016-07-28 ENCOUNTER — Telehealth: Payer: Self-pay | Admitting: *Deleted

## 2016-07-28 NOTE — Telephone Encounter (Signed)
Called pt, requested she come in to review Revlimid initiation packet and sign forms. She is unable to come in before 07/31/16. Will complete Revlimid packet then.

## 2016-07-28 NOTE — Telephone Encounter (Signed)
Message from [place], [place] with [facility] reporting pt declined home safety and PT evaluation. She stated she does not need physical therapy. Pt is requesting a letter for her landlord to install hand rails at her front entrance. She will discuss this with provider at next visit.

## 2016-07-31 ENCOUNTER — Other Ambulatory Visit (HOSPITAL_BASED_OUTPATIENT_CLINIC_OR_DEPARTMENT_OTHER): Payer: Medicare HMO

## 2016-07-31 ENCOUNTER — Ambulatory Visit (HOSPITAL_BASED_OUTPATIENT_CLINIC_OR_DEPARTMENT_OTHER): Payer: Medicare HMO

## 2016-07-31 VITALS — BP 149/72 | HR 73 | Temp 98.8°F | Resp 18

## 2016-07-31 DIAGNOSIS — C9000 Multiple myeloma not having achieved remission: Secondary | ICD-10-CM

## 2016-07-31 DIAGNOSIS — Z5112 Encounter for antineoplastic immunotherapy: Secondary | ICD-10-CM | POA: Diagnosis not present

## 2016-07-31 LAB — COMPREHENSIVE METABOLIC PANEL
ALBUMIN: 3.4 g/dL — AB (ref 3.5–5.0)
ALK PHOS: 371 U/L — AB (ref 40–150)
ALT: 12 U/L (ref 0–55)
AST: 16 U/L (ref 5–34)
Anion Gap: 8 mEq/L (ref 3–11)
BILIRUBIN TOTAL: 0.41 mg/dL (ref 0.20–1.20)
BUN: 18.5 mg/dL (ref 7.0–26.0)
CO2: 17 mEq/L — ABNORMAL LOW (ref 22–29)
CREATININE: 1 mg/dL (ref 0.6–1.1)
Calcium: 9.4 mg/dL (ref 8.4–10.4)
Chloride: 113 mEq/L — ABNORMAL HIGH (ref 98–109)
EGFR: 68 mL/min/{1.73_m2} — AB (ref >90)
GLUCOSE: 135 mg/dL (ref 70–140)
Potassium: 4.5 mEq/L (ref 3.5–5.1)
SODIUM: 138 meq/L (ref 136–145)
TOTAL PROTEIN: 8.6 g/dL — AB (ref 6.4–8.3)

## 2016-07-31 LAB — CBC WITH DIFFERENTIAL/PLATELET
BASO%: 0.2 % (ref 0.0–2.0)
Basophils Absolute: 0 10*3/uL (ref 0.0–0.1)
EOS ABS: 0 10*3/uL (ref 0.0–0.5)
EOS%: 0 % (ref 0.0–7.0)
HCT: 37.3 % (ref 34.8–46.6)
HEMOGLOBIN: 12.1 g/dL (ref 11.6–15.9)
LYMPH%: 11.9 % — ABNORMAL LOW (ref 14.0–49.7)
MCH: 29.8 pg (ref 25.1–34.0)
MCHC: 32.4 g/dL (ref 31.5–36.0)
MCV: 91.9 fL (ref 79.5–101.0)
MONO#: 0.1 10*3/uL (ref 0.1–0.9)
MONO%: 1.4 % (ref 0.0–14.0)
NEUT%: 86.5 % — ABNORMAL HIGH (ref 38.4–76.8)
NEUTROS ABS: 5 10*3/uL (ref 1.5–6.5)
Platelets: 201 10*3/uL (ref 145–400)
RBC: 4.06 10*6/uL (ref 3.70–5.45)
RDW: 19.8 % — AB (ref 11.2–14.5)
WBC: 5.7 10*3/uL (ref 3.9–10.3)
lymph#: 0.7 10*3/uL — ABNORMAL LOW (ref 0.9–3.3)

## 2016-07-31 MED ORDER — ONDANSETRON HCL 8 MG PO TABS
ORAL_TABLET | ORAL | Status: AC
  Filled 2016-07-31: qty 1

## 2016-07-31 MED ORDER — ACYCLOVIR 400 MG PO TABS: 400.0000 mg | ORAL_TABLET | Freq: Two times a day (BID) | ORAL | 0 refills | Status: DC

## 2016-07-31 MED ORDER — ONDANSETRON HCL 8 MG PO TABS
8.0000 mg | ORAL_TABLET | Freq: Once | ORAL | Status: AC
  Administered 2016-07-31: 8 mg via ORAL

## 2016-07-31 MED ORDER — BORTEZOMIB CHEMO SQ INJECTION 3.5 MG (2.5MG/ML)
1.3000 mg/m2 | Freq: Once | INTRAMUSCULAR | Status: AC
  Administered 2016-07-31: 2.5 mg via SUBCUTANEOUS
  Filled 2016-07-31: qty 2.5

## 2016-07-31 MED ORDER — PROCHLORPERAZINE MALEATE 10 MG PO TABS
ORAL_TABLET | ORAL | Status: AC
  Filled 2016-07-31: qty 1

## 2016-07-31 MED ORDER — ONDANSETRON HCL 8 MG PO TABS: 8.0000 mg | ORAL_TABLET | Freq: Three times a day (TID) | ORAL | 1 refills | Status: DC | PRN

## 2016-07-31 NOTE — Progress Notes (Signed)
Spoke with pt in infusion room. Revlimid teaching reviewed. Pt signed Revlimid enrollment forms. Denies any questions at this time.

## 2016-07-31 NOTE — Patient Instructions (Signed)
[facility] Discharge Instructions for Patients Receiving Chemotherapy  Today you received the following chemotherapy agents VELCADE To help prevent nausea and vomiting after your treatment, we encourage you to take your nausea medication as prescribed.  If you develop nausea and vomiting that is not controlled by your nausea medication, call the clinic.   BELOW ARE SYMPTOMS THAT SHOULD BE REPORTED IMMEDIATELY:  *FEVER GREATER THAN 100.5 F  *CHILLS WITH OR WITHOUT FEVER  NAUSEA AND VOMITING THAT IS NOT CONTROLLED WITH YOUR NAUSEA MEDICATION  *UNUSUAL SHORTNESS OF BREATH  *UNUSUAL BRUISING OR BLEEDING  TENDERNESS IN MOUTH AND THROAT WITH OR WITHOUT PRESENCE OF ULCERS  *URINARY PROBLEMS  *BOWEL PROBLEMS  UNUSUAL RASH Items with * indicate a potential emergency and should be followed up as soon as possible.  Feel free to call the clinic should you have any questions or concerns. The clinic phone number is [phone].  Please show the CHEMO ALERT CARD at check-in to the Emergency Department and triage nurse.   

## 2016-08-01 ENCOUNTER — Other Ambulatory Visit: Payer: Self-pay | Admitting: *Deleted

## 2016-08-01 LAB — KAPPA/LAMBDA LIGHT CHAINS
IG KAPPA FREE LIGHT CHAIN: 4.4 mg/L (ref 3.3–19.4)
IG LAMBDA FREE LIGHT CHAIN: 26 mg/L (ref 5.7–26.3)
KAPPA/LAMBDA FLC RATIO: 0.17 — AB (ref 0.26–1.65)

## 2016-08-01 MED ORDER — LENALIDOMIDE 15 MG PO CAPS: 15.0000 mg | ORAL_CAPSULE | Freq: Every day | ORAL | 0 refills | Status: DC

## 2016-08-01 NOTE — Telephone Encounter (Signed)
Revlimid enrollment complete. Initial prescription e-prescribed to [facility].

## 2016-08-03 ENCOUNTER — Other Ambulatory Visit: Payer: Self-pay | Admitting: Oncology

## 2016-08-07 ENCOUNTER — Other Ambulatory Visit (HOSPITAL_BASED_OUTPATIENT_CLINIC_OR_DEPARTMENT_OTHER): Payer: Medicare HMO

## 2016-08-07 ENCOUNTER — Ambulatory Visit (HOSPITAL_BASED_OUTPATIENT_CLINIC_OR_DEPARTMENT_OTHER): Payer: Medicare HMO

## 2016-08-07 VITALS — BP 129/61 | HR 68 | Temp 98.8°F | Resp 19 | Wt 168.0 lb

## 2016-08-07 DIAGNOSIS — C9000 Multiple myeloma not having achieved remission: Secondary | ICD-10-CM

## 2016-08-07 DIAGNOSIS — Z5112 Encounter for antineoplastic immunotherapy: Secondary | ICD-10-CM | POA: Diagnosis not present

## 2016-08-07 LAB — COMPREHENSIVE METABOLIC PANEL
ALT: 9 U/L (ref 0–55)
AST: 12 U/L (ref 5–34)
Albumin: 3.2 g/dL — ABNORMAL LOW (ref 3.5–5.0)
Alkaline Phosphatase: 368 U/L — ABNORMAL HIGH (ref 40–150)
Anion Gap: 8 mEq/L (ref 3–11)
BUN: 16.6 mg/dL (ref 7.0–26.0)
CHLORIDE: 113 meq/L — AB (ref 98–109)
CO2: 19 meq/L — AB (ref 22–29)
Calcium: 9 mg/dL (ref 8.4–10.4)
Creatinine: 0.8 mg/dL (ref 0.6–1.1)
EGFR: 82 mL/min/{1.73_m2} — AB (ref >90)
GLUCOSE: 151 mg/dL — AB (ref 70–140)
POTASSIUM: 4.7 meq/L (ref 3.5–5.1)
SODIUM: 140 meq/L (ref 136–145)
Total Bilirubin: 0.24 mg/dL (ref 0.20–1.20)
Total Protein: 7.4 g/dL (ref 6.4–8.3)

## 2016-08-07 LAB — CBC WITH DIFFERENTIAL/PLATELET
BASO%: 0.2 % (ref 0.0–2.0)
BASOS ABS: 0 10*3/uL (ref 0.0–0.1)
EOS ABS: 0 10*3/uL (ref 0.0–0.5)
EOS%: 0.3 % (ref 0.0–7.0)
HCT: 35.3 % (ref 34.8–46.6)
HGB: 11.2 g/dL — ABNORMAL LOW (ref 11.6–15.9)
LYMPH%: 11.8 % — AB (ref 14.0–49.7)
MCH: 29.4 pg (ref 25.1–34.0)
MCHC: 31.7 g/dL (ref 31.5–36.0)
MCV: 92.7 fL (ref 79.5–101.0)
MONO#: 0.2 10*3/uL (ref 0.1–0.9)
MONO%: 2.3 % (ref 0.0–14.0)
NEUT#: 5.7 10*3/uL (ref 1.5–6.5)
NEUT%: 85.4 % — ABNORMAL HIGH (ref 38.4–76.8)
Platelets: 178 10*3/uL (ref 145–400)
RBC: 3.81 10*6/uL (ref 3.70–5.45)
RDW: 19.8 % — ABNORMAL HIGH (ref 11.2–14.5)
WBC: 6.6 10*3/uL (ref 3.9–10.3)
lymph#: 0.8 10*3/uL — ABNORMAL LOW (ref 0.9–3.3)

## 2016-08-07 MED ORDER — BORTEZOMIB CHEMO SQ INJECTION 3.5 MG (2.5MG/ML)
1.3000 mg/m2 | Freq: Once | INTRAMUSCULAR | Status: AC
  Administered 2016-08-07: 2.5 mg via SUBCUTANEOUS
  Filled 2016-08-07: qty 2.5

## 2016-08-07 MED ORDER — ONDANSETRON HCL 8 MG PO TABS: 8.0000 mg | ORAL_TABLET | Freq: Once | ORAL | Status: DC

## 2016-08-07 NOTE — Patient Instructions (Signed)
[facility] Discharge Instructions for Patients Receiving Chemotherapy  Today you received the following chemotherapy agents VELCADE To help prevent nausea and vomiting after your treatment, we encourage you to take your nausea medication as prescribed.  If you develop nausea and vomiting that is not controlled by your nausea medication, call the clinic.   BELOW ARE SYMPTOMS THAT SHOULD BE REPORTED IMMEDIATELY:  *FEVER GREATER THAN 100.5 F  *CHILLS WITH OR WITHOUT FEVER  NAUSEA AND VOMITING THAT IS NOT CONTROLLED WITH YOUR NAUSEA MEDICATION  *UNUSUAL SHORTNESS OF BREATH  *UNUSUAL BRUISING OR BLEEDING  TENDERNESS IN MOUTH AND THROAT WITH OR WITHOUT PRESENCE OF ULCERS  *URINARY PROBLEMS  *BOWEL PROBLEMS  UNUSUAL RASH Items with * indicate a potential emergency and should be followed up as soon as possible.  Feel free to call the clinic should you have any questions or concerns. The clinic phone number is [phone].  Please show the CHEMO ALERT CARD at check-in to the Emergency Department and triage nurse.   

## 2016-08-10 ENCOUNTER — Other Ambulatory Visit: Payer: Self-pay | Admitting: Oncology

## 2016-08-13 ENCOUNTER — Telehealth: Payer: Self-pay | Admitting: *Deleted

## 2016-08-13 MED ORDER — LENALIDOMIDE 15 MG PO CAPS: 15.0000 mg | ORAL_CAPSULE | Freq: Every day | ORAL | 0 refills | Status: DC

## 2016-08-13 NOTE — Telephone Encounter (Signed)
Message from [place] with [facility] requesting demographics, med list and new Revlimid prescription to be faxed. The received forwarded script from [employer] [facility].  Faxed to [phone].

## 2016-08-14 ENCOUNTER — Telehealth: Payer: Self-pay | Admitting: Pharmacist

## 2016-08-14 ENCOUNTER — Ambulatory Visit (HOSPITAL_BASED_OUTPATIENT_CLINIC_OR_DEPARTMENT_OTHER): Payer: Medicare HMO

## 2016-08-14 ENCOUNTER — Ambulatory Visit (HOSPITAL_BASED_OUTPATIENT_CLINIC_OR_DEPARTMENT_OTHER): Payer: Medicare HMO | Admitting: Nurse Practitioner

## 2016-08-14 ENCOUNTER — Other Ambulatory Visit (HOSPITAL_BASED_OUTPATIENT_CLINIC_OR_DEPARTMENT_OTHER): Payer: Medicare HMO

## 2016-08-14 ENCOUNTER — Telehealth: Payer: Self-pay | Admitting: Nurse Practitioner

## 2016-08-14 VITALS — BP 147/64 | HR 65 | Temp 98.6°F | Resp 18 | Ht 65.5 in | Wt 171.8 lb

## 2016-08-14 DIAGNOSIS — C9000 Multiple myeloma not having achieved remission: Secondary | ICD-10-CM

## 2016-08-14 DIAGNOSIS — Z5112 Encounter for antineoplastic immunotherapy: Secondary | ICD-10-CM | POA: Diagnosis not present

## 2016-08-14 DIAGNOSIS — D630 Anemia in neoplastic disease: Secondary | ICD-10-CM

## 2016-08-14 DIAGNOSIS — Z23 Encounter for immunization: Secondary | ICD-10-CM | POA: Diagnosis not present

## 2016-08-14 LAB — CBC WITH DIFFERENTIAL/PLATELET
BASO%: 0.1 % (ref 0.0–2.0)
BASOS ABS: 0 10*3/uL (ref 0.0–0.1)
EOS ABS: 0 10*3/uL (ref 0.0–0.5)
EOS%: 0.3 % (ref 0.0–7.0)
HEMATOCRIT: 35.7 % (ref 34.8–46.6)
HEMOGLOBIN: 11.3 g/dL — AB (ref 11.6–15.9)
LYMPH#: 0.7 10*3/uL — AB (ref 0.9–3.3)
LYMPH%: 9.2 % — ABNORMAL LOW (ref 14.0–49.7)
MCH: 29.6 pg (ref 25.1–34.0)
MCHC: 31.7 g/dL (ref 31.5–36.0)
MCV: 93.5 fL (ref 79.5–101.0)
MONO#: 0.2 10*3/uL (ref 0.1–0.9)
MONO%: 2.7 % (ref 0.0–14.0)
NEUT#: 6.5 10*3/uL (ref 1.5–6.5)
NEUT%: 87.7 % — ABNORMAL HIGH (ref 38.4–76.8)
PLATELETS: 174 10*3/uL (ref 145–400)
RBC: 3.82 10*6/uL (ref 3.70–5.45)
RDW: 19.8 % — AB (ref 11.2–14.5)
WBC: 7.4 10*3/uL (ref 3.9–10.3)

## 2016-08-14 LAB — BASIC METABOLIC PANEL
Anion Gap: 6 mEq/L (ref 3–11)
BUN: 14.3 mg/dL (ref 7.0–26.0)
CALCIUM: 8.7 mg/dL (ref 8.4–10.4)
CO2: 19 meq/L — AB (ref 22–29)
Chloride: 116 mEq/L — ABNORMAL HIGH (ref 98–109)
Creatinine: 0.8 mg/dL (ref 0.6–1.1)
EGFR: 83 mL/min/{1.73_m2} — AB (ref >90)
GLUCOSE: 109 mg/dL (ref 70–140)
Potassium: 4.8 mEq/L (ref 3.5–5.1)
Sodium: 141 mEq/L (ref 136–145)

## 2016-08-14 MED ORDER — INFLUENZA VAC SPLIT QUAD 0.5 ML IM SUSY
0.5000 mL | PREFILLED_SYRINGE | Freq: Once | INTRAMUSCULAR | Status: AC
  Administered 2016-08-14: 0.5 mL via INTRAMUSCULAR
  Filled 2016-08-14: qty 0.5

## 2016-08-14 MED ORDER — ONDANSETRON HCL 8 MG PO TABS
ORAL_TABLET | ORAL | Status: AC
  Filled 2016-08-14: qty 1

## 2016-08-14 MED ORDER — ONDANSETRON HCL 8 MG PO TABS: 8.0000 mg | ORAL_TABLET | Freq: Once | ORAL | Status: DC

## 2016-08-14 MED ORDER — BORTEZOMIB CHEMO SQ INJECTION 3.5 MG (2.5MG/ML)
1.3000 mg/m2 | Freq: Once | INTRAMUSCULAR | Status: AC
  Administered 2016-08-14: 2.5 mg via SUBCUTANEOUS
  Filled 2016-08-14: qty 2.5

## 2016-08-14 NOTE — Patient Instructions (Signed)
[facility] Discharge Instructions for Patients Receiving Chemotherapy  Today you received the following chemotherapy agents Velcade To help prevent nausea and vomiting after your treatment, we encourage you to take your nausea medication as prescribed.   If you develop nausea and vomiting that is not controlled by your nausea medication, call the clinic.   BELOW ARE SYMPTOMS THAT SHOULD BE REPORTED IMMEDIATELY:  *FEVER GREATER THAN 100.5 F  *CHILLS WITH OR WITHOUT FEVER  NAUSEA AND VOMITING THAT IS NOT CONTROLLED WITH YOUR NAUSEA MEDICATION  *UNUSUAL SHORTNESS OF BREATH  *UNUSUAL BRUISING OR BLEEDING  TENDERNESS IN MOUTH AND THROAT WITH OR WITHOUT PRESENCE OF ULCERS  *URINARY PROBLEMS  *BOWEL PROBLEMS  UNUSUAL RASH Items with * indicate a potential emergency and should be followed up as soon as possible.  Feel free to call the clinic should you have any questions or concerns. The clinic phone number is [phone].  Please show the CHEMO ALERT CARD at check-in to the Emergency Department and triage nurse.   

## 2016-08-14 NOTE — Telephone Encounter (Signed)
Received fax communication from [employer]. Stated they still needed physician signature on Prior Auth form as well as clinical information (office notes and recent labs) in order to complete Prior Authorization for Revlimid. [facility] will submit the PA to insurance for our office. Sent requested information. Fax confirmation received.  Raul Del, PharmD, BCPS, BCOP Oral Chemotherapy Clinic [phone]

## 2016-08-14 NOTE — Progress Notes (Signed)
  [place] OFFICE PROGRESS NOTE   Diagnosis:  Multiple myeloma  INTERVAL HISTORY:   Ms. Pinkus returns as scheduled. She continues weekly Velcade/Decadron. She denies nausea/vomiting. No mouth sores. No diarrhea. She has occasional constipation. She takes a stool softener and mild laxative as needed. She denies pain.  Objective:  Vital signs in last 24 hours:  Blood pressure (!) 147/64, pulse 65, temperature 98.6 F (37 C), temperature source Oral, resp. rate 18, height 5' 5.5" (1.664 m), weight 171 lb 12.8 oz (77.9 kg), SpO2 99 %.    HEENT: No thrush or ulcers. Resp: Lungs clear bilaterally. Cardio: Regular rate and rhythm. GI: Abdomen soft and nontender. No hepatomegaly. Vascular: Trace bilateral pretibial edema. Calves soft and nontender.   Lab Results:  Lab Results  Component Value Date   WBC 7.4 08/14/2016   HGB 11.3 (L) 08/14/2016   HCT 35.7 08/14/2016   MCV 93.5 08/14/2016   PLT 174 08/14/2016   NEUTROABS 6.5 08/14/2016    Imaging:  No results found.  Medications: I have reviewed the patient's current medications.  Assessment/Plan: 1. Multiple myeloma, IgG lambda, bone marrow biopsy 06/15/2016 confirmed multiple myeloma; cytogenetics by FISH show +11, +12, 13q-  Elevated serum free lambda light chains  Lambda light chain proteinuria  Lytic bone lesions on a bone survey 06/13/2016  Initiation of weekly Velcade/Decadron 06/19/2016  Serum light chains improved 07/31/2016  2. Severe anemia secondary to #1  3. 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 History of coronary artery disease/myocardial infarction  5. History of colon polyps   Disposition: Ms. Whistler appears stable. Serum light chains improved 07/31/2016. Plan to continue weekly Velcade/Decadron. She has not received Revlimid. She will contact the office once the Revlimid arrives. We made a  referral to Dr. Ok Edwards at [facility] following her last visit. She has not heard regarding an appointment. We will follow-up with our referral Department.  We will see her in follow-up in 3 weeks. She will contact the office in the interim with any problems.    [name] ANP/GNP-BC   08/14/2016  2:49 PM

## 2016-08-14 NOTE — Telephone Encounter (Signed)
Message sent to chemo scheduler to add chemo injection. AVS report and appointment schedule given to patient, per 08/14/16 los.

## 2016-08-15 ENCOUNTER — Telehealth: Payer: Self-pay | Admitting: *Deleted

## 2016-08-15 ENCOUNTER — Telehealth: Payer: Self-pay | Admitting: Oncology

## 2016-08-15 NOTE — Telephone Encounter (Signed)
Per LOS I have scheduled apapts and called the patient

## 2016-08-15 NOTE — Telephone Encounter (Signed)
Message from pt to clarify appointments. Informed her the chemo scheduler will add injection appointments and call her with times. She voiced appreciation for call.

## 2016-08-15 NOTE — Telephone Encounter (Signed)
Faxed pt records to [facility] at Dr. Octaviano Batty office. Marliss Coots will review records and if approved will call pt with appt.

## 2016-08-17 ENCOUNTER — Other Ambulatory Visit: Payer: Self-pay | Admitting: Oncology

## 2016-08-18 ENCOUNTER — Telehealth: Payer: Self-pay | Admitting: *Deleted

## 2016-08-18 NOTE — Telephone Encounter (Signed)
Patient representative called wanting to know if MD Benay Spice received the [employer] revlimid assistance paper to sign and -return. Representative can be reached at [facility]: [phone])

## 2016-08-18 NOTE — Telephone Encounter (Signed)
Fax from [facility]: Pt's file has been closed: At this time there is no funding for the medications/ diagnosis and the manufacturer will not donate if pt has insurance.  [employer] Patient Access form was attached. Will fax form to [employer] once signed by provider.

## 2016-08-18 NOTE — Telephone Encounter (Signed)
Faxed enrollment form to [employer] Patient [employer].

## 2016-08-19 ENCOUNTER — Other Ambulatory Visit: Payer: Self-pay | Admitting: *Deleted

## 2016-08-19 MED ORDER — LENALIDOMIDE 15 MG PO CAPS: 15.0000 mg | ORAL_CAPSULE | Freq: Every day | ORAL | 0 refills | Status: DC

## 2016-08-19 NOTE — Telephone Encounter (Signed)
Call from [place] with [employer] ([employer] Patient [employer]) requesting to verify prescription that was forwarded. Script verified verbally. They will contact patient to set up shipment of drug.  Pt made aware to expect call from pharmacy. She will let us know when she receives Revlimid.  Pt stated during call that she has decided against transplant. Dr. Benay Spice made aware.

## 2016-08-21 ENCOUNTER — Other Ambulatory Visit (HOSPITAL_BASED_OUTPATIENT_CLINIC_OR_DEPARTMENT_OTHER): Payer: Medicare HMO

## 2016-08-21 ENCOUNTER — Ambulatory Visit (HOSPITAL_BASED_OUTPATIENT_CLINIC_OR_DEPARTMENT_OTHER): Payer: Medicare HMO

## 2016-08-21 VITALS — BP 140/73 | HR 77 | Temp 98.9°F | Resp 18

## 2016-08-21 DIAGNOSIS — Z5112 Encounter for antineoplastic immunotherapy: Secondary | ICD-10-CM | POA: Diagnosis not present

## 2016-08-21 DIAGNOSIS — C9000 Multiple myeloma not having achieved remission: Secondary | ICD-10-CM | POA: Diagnosis not present

## 2016-08-21 LAB — CBC WITH DIFFERENTIAL/PLATELET
BASO%: 0.2 % (ref 0.0–2.0)
Basophils Absolute: 0 10*3/uL (ref 0.0–0.1)
EOS%: 0.8 % (ref 0.0–7.0)
Eosinophils Absolute: 0.1 10*3/uL (ref 0.0–0.5)
HCT: 36.9 % (ref 34.8–46.6)
HGB: 11.7 g/dL (ref 11.6–15.9)
LYMPH%: 10.9 % — ABNORMAL LOW (ref 14.0–49.7)
MCH: 29.7 pg (ref 25.1–34.0)
MCHC: 31.7 g/dL (ref 31.5–36.0)
MCV: 93.7 fL (ref 79.5–101.0)
MONO#: 0.2 10*3/uL (ref 0.1–0.9)
MONO%: 2.6 % (ref 0.0–14.0)
NEUT%: 85.5 % — ABNORMAL HIGH (ref 38.4–76.8)
NEUTROS ABS: 5.5 10*3/uL (ref 1.5–6.5)
Platelets: 187 10*3/uL (ref 145–400)
RBC: 3.94 10*6/uL (ref 3.70–5.45)
RDW: 19.6 % — AB (ref 11.2–14.5)
WBC: 6.4 10*3/uL (ref 3.9–10.3)
lymph#: 0.7 10*3/uL — ABNORMAL LOW (ref 0.9–3.3)

## 2016-08-21 LAB — COMPREHENSIVE METABOLIC PANEL
ALBUMIN: 3.5 g/dL (ref 3.5–5.0)
ALK PHOS: 285 U/L — AB (ref 40–150)
ALT: 12 U/L (ref 0–55)
AST: 13 U/L (ref 5–34)
Anion Gap: 7 mEq/L (ref 3–11)
BUN: 16.4 mg/dL (ref 7.0–26.0)
CO2: 20 meq/L — AB (ref 22–29)
Calcium: 9.1 mg/dL (ref 8.4–10.4)
Chloride: 114 mEq/L — ABNORMAL HIGH (ref 98–109)
Creatinine: 0.8 mg/dL (ref 0.6–1.1)
EGFR: 84 mL/min/{1.73_m2} — ABNORMAL LOW (ref >90)
GLUCOSE: 110 mg/dL (ref 70–140)
POTASSIUM: 4.7 meq/L (ref 3.5–5.1)
SODIUM: 141 meq/L (ref 136–145)
TOTAL PROTEIN: 7.6 g/dL (ref 6.4–8.3)
Total Bilirubin: 0.4 mg/dL (ref 0.20–1.20)

## 2016-08-21 MED ORDER — ONDANSETRON HCL 8 MG PO TABS: 8.0000 mg | ORAL_TABLET | Freq: Once | ORAL | Status: DC

## 2016-08-21 MED ORDER — BORTEZOMIB CHEMO SQ INJECTION 3.5 MG (2.5MG/ML)
1.3000 mg/m2 | Freq: Once | INTRAMUSCULAR | Status: AC
  Administered 2016-08-21: 2.5 mg via SUBCUTANEOUS
  Filled 2016-08-21: qty 2.5

## 2016-08-22 ENCOUNTER — Telehealth: Payer: Self-pay | Admitting: Oncology

## 2016-08-22 NOTE — Telephone Encounter (Signed)
Dr. Octaviano Batty office called pt to schedule appt.  Pt declined appt at this time.

## 2016-08-23 ENCOUNTER — Other Ambulatory Visit: Payer: Self-pay | Admitting: Oncology

## 2016-08-28 ENCOUNTER — Ambulatory Visit (HOSPITAL_BASED_OUTPATIENT_CLINIC_OR_DEPARTMENT_OTHER): Payer: Medicare HMO

## 2016-08-28 ENCOUNTER — Other Ambulatory Visit (HOSPITAL_BASED_OUTPATIENT_CLINIC_OR_DEPARTMENT_OTHER): Payer: Medicare HMO

## 2016-08-28 ENCOUNTER — Other Ambulatory Visit: Payer: Medicare HMO

## 2016-08-28 ENCOUNTER — Ambulatory Visit: Payer: Medicare HMO | Admitting: Nurse Practitioner

## 2016-08-28 VITALS — BP 157/70 | HR 58 | Temp 98.8°F | Resp 18

## 2016-08-28 DIAGNOSIS — Z5112 Encounter for antineoplastic immunotherapy: Secondary | ICD-10-CM | POA: Diagnosis not present

## 2016-08-28 DIAGNOSIS — C9000 Multiple myeloma not having achieved remission: Secondary | ICD-10-CM

## 2016-08-28 LAB — CBC WITH DIFFERENTIAL/PLATELET
BASO%: 0.4 % (ref 0.0–2.0)
BASOS ABS: 0 10*3/uL (ref 0.0–0.1)
EOS%: 1.9 % (ref 0.0–7.0)
Eosinophils Absolute: 0.2 10*3/uL (ref 0.0–0.5)
HEMATOCRIT: 34 % — AB (ref 34.8–46.6)
HEMOGLOBIN: 11 g/dL — AB (ref 11.6–15.9)
LYMPH#: 0.8 10*3/uL — AB (ref 0.9–3.3)
LYMPH%: 8.9 % — AB (ref 14.0–49.7)
MCH: 30.2 pg (ref 25.1–34.0)
MCHC: 32.4 g/dL (ref 31.5–36.0)
MCV: 93.4 fL (ref 79.5–101.0)
MONO#: 0.3 10*3/uL (ref 0.1–0.9)
MONO%: 3.8 % (ref 0.0–14.0)
NEUT#: 7.2 10*3/uL — ABNORMAL HIGH (ref 1.5–6.5)
NEUT%: 85 % — AB (ref 38.4–76.8)
Platelets: 170 10*3/uL (ref 145–400)
RBC: 3.64 10*6/uL — ABNORMAL LOW (ref 3.70–5.45)
RDW: 19.4 % — ABNORMAL HIGH (ref 11.2–14.5)
WBC: 8.4 10*3/uL (ref 3.9–10.3)
nRBC: 0 % (ref 0–0)

## 2016-08-28 LAB — COMPREHENSIVE METABOLIC PANEL
ALBUMIN: 3.3 g/dL — AB (ref 3.5–5.0)
ALK PHOS: 267 U/L — AB (ref 40–150)
ALT: 14 U/L (ref 0–55)
AST: 15 U/L (ref 5–34)
Anion Gap: 10 mEq/L (ref 3–11)
BUN: 21 mg/dL (ref 7.0–26.0)
CALCIUM: 8.8 mg/dL (ref 8.4–10.4)
CHLORIDE: 114 meq/L — AB (ref 98–109)
CO2: 18 mEq/L — ABNORMAL LOW (ref 22–29)
Creatinine: 0.8 mg/dL (ref 0.6–1.1)
EGFR: 84 mL/min/{1.73_m2} — AB (ref >90)
Glucose: 111 mg/dl (ref 70–140)
POTASSIUM: 4.2 meq/L (ref 3.5–5.1)
Sodium: 141 mEq/L (ref 136–145)
Total Bilirubin: 0.39 mg/dL (ref 0.20–1.20)
Total Protein: 7.3 g/dL (ref 6.4–8.3)

## 2016-08-28 LAB — TECHNOLOGIST REVIEW

## 2016-08-28 MED ORDER — BORTEZOMIB CHEMO SQ INJECTION 3.5 MG (2.5MG/ML)
1.3000 mg/m2 | Freq: Once | INTRAMUSCULAR | Status: AC
  Administered 2016-08-28: 2.5 mg via SUBCUTANEOUS
  Filled 2016-08-28: qty 2.5

## 2016-08-28 MED ORDER — ONDANSETRON HCL 8 MG PO TABS: 8.0000 mg | ORAL_TABLET | Freq: Once | ORAL | Status: DC

## 2016-08-28 NOTE — Patient Instructions (Signed)
[facility] Discharge Instructions for Patients Receiving Chemotherapy  Today you received the following chemotherapy agents Velcade. To help prevent nausea and vomiting after your treatment, we encourage you to take your nausea medication as directed.  If you develop nausea and vomiting that is not controlled by your nausea medication, call the clinic.   BELOW ARE SYMPTOMS THAT SHOULD BE REPORTED IMMEDIATELY:  *FEVER GREATER THAN 100.5 F  *CHILLS WITH OR WITHOUT FEVER  NAUSEA AND VOMITING THAT IS NOT CONTROLLED WITH YOUR NAUSEA MEDICATION  *UNUSUAL SHORTNESS OF BREATH  *UNUSUAL BRUISING OR BLEEDING  TENDERNESS IN MOUTH AND THROAT WITH OR WITHOUT PRESENCE OF ULCERS  *URINARY PROBLEMS  *BOWEL PROBLEMS  UNUSUAL RASH Items with * indicate a potential emergency and should be followed up as soon as possible.  Feel free to call the clinic you have any questions or concerns. The clinic phone number is [phone].  Please show the CHEMO ALERT CARD at check-in to the Emergency Department and triage nurse.    

## 2016-08-29 ENCOUNTER — Other Ambulatory Visit: Payer: Self-pay | Admitting: Oncology

## 2016-08-29 LAB — KAPPA/LAMBDA LIGHT CHAINS
IG KAPPA FREE LIGHT CHAIN: 8.2 mg/L (ref 3.3–19.4)
IG LAMBDA FREE LIGHT CHAIN: 20.5 mg/L (ref 5.7–26.3)
Kappa/Lambda FluidC Ratio: 0.4 (ref 0.26–1.65)

## 2016-08-31 ENCOUNTER — Other Ambulatory Visit: Payer: Self-pay | Admitting: Oncology

## 2016-09-01 ENCOUNTER — Telehealth: Payer: Self-pay | Admitting: Pharmacist

## 2016-09-01 NOTE — Telephone Encounter (Signed)
I placed call to [facility] (ph# [phone]) to f/u status of Revlimid Rx.  I was informed by rep, Aldona Bar that pt was approved on 11/1 for free drug w/ [employer].  It is being filled through [facility] specialty pharmacy (ph# [phone]). I called pt & her niece, Solmon Ice answered and s/w me re: Revlimid.  Pt was not home when I called. Felicia stated pt got her Revlimid on 11/3 and started taking it that day.  Pt returns this Th 11/16 for lab/visit w/ [name], NP & infusion.  If needed we can s/w pt in infusion that day to determine tolerance/compliance.  Kennith Center, Pharm.D., CPP 09/01/2016@11 :17 AM Oral Chemo Clinic

## 2016-09-04 ENCOUNTER — Telehealth: Payer: Self-pay | Admitting: Oncology

## 2016-09-04 ENCOUNTER — Other Ambulatory Visit (HOSPITAL_BASED_OUTPATIENT_CLINIC_OR_DEPARTMENT_OTHER): Payer: Medicare HMO

## 2016-09-04 ENCOUNTER — Other Ambulatory Visit: Payer: Self-pay

## 2016-09-04 ENCOUNTER — Ambulatory Visit (HOSPITAL_BASED_OUTPATIENT_CLINIC_OR_DEPARTMENT_OTHER): Payer: Medicare HMO | Admitting: Nurse Practitioner

## 2016-09-04 ENCOUNTER — Ambulatory Visit (HOSPITAL_BASED_OUTPATIENT_CLINIC_OR_DEPARTMENT_OTHER): Payer: Medicare HMO

## 2016-09-04 VITALS — BP 147/63 | HR 69 | Temp 98.8°F | Resp 17 | Ht 65.5 in | Wt 181.0 lb

## 2016-09-04 DIAGNOSIS — C9000 Multiple myeloma not having achieved remission: Secondary | ICD-10-CM | POA: Diagnosis not present

## 2016-09-04 DIAGNOSIS — D6481 Anemia due to antineoplastic chemotherapy: Secondary | ICD-10-CM | POA: Diagnosis not present

## 2016-09-04 DIAGNOSIS — Z5112 Encounter for antineoplastic immunotherapy: Secondary | ICD-10-CM

## 2016-09-04 LAB — COMPREHENSIVE METABOLIC PANEL
ALT: 20 U/L (ref 0–55)
AST: 13 U/L (ref 5–34)
Albumin: 3.2 g/dL — ABNORMAL LOW (ref 3.5–5.0)
Alkaline Phosphatase: 239 U/L — ABNORMAL HIGH (ref 40–150)
Anion Gap: 10 mEq/L (ref 3–11)
BUN: 11 mg/dL (ref 7.0–26.0)
CO2: 20 meq/L — AB (ref 22–29)
Calcium: 8.6 mg/dL (ref 8.4–10.4)
Chloride: 114 mEq/L — ABNORMAL HIGH (ref 98–109)
Creatinine: 0.8 mg/dL (ref 0.6–1.1)
EGFR: 88 mL/min/{1.73_m2} — AB (ref >90)
GLUCOSE: 111 mg/dL (ref 70–140)
POTASSIUM: 3.9 meq/L (ref 3.5–5.1)
SODIUM: 144 meq/L (ref 136–145)
Total Bilirubin: 0.47 mg/dL (ref 0.20–1.20)
Total Protein: 6.9 g/dL (ref 6.4–8.3)

## 2016-09-04 LAB — CBC WITH DIFFERENTIAL/PLATELET
BASO%: 0.3 % (ref 0.0–2.0)
BASOS ABS: 0 10*3/uL (ref 0.0–0.1)
EOS ABS: 0.2 10*3/uL (ref 0.0–0.5)
EOS%: 1.9 % (ref 0.0–7.0)
HCT: 33.1 % — ABNORMAL LOW (ref 34.8–46.6)
HGB: 10.7 g/dL — ABNORMAL LOW (ref 11.6–15.9)
LYMPH%: 8.1 % — AB (ref 14.0–49.7)
MCH: 30.1 pg (ref 25.1–34.0)
MCHC: 32.3 g/dL (ref 31.5–36.0)
MCV: 93.3 fL (ref 79.5–101.0)
MONO#: 0.5 10*3/uL (ref 0.1–0.9)
MONO%: 6.3 % (ref 0.0–14.0)
NEUT#: 6.6 10*3/uL — ABNORMAL HIGH (ref 1.5–6.5)
NEUT%: 83.4 % — ABNORMAL HIGH (ref 38.4–76.8)
Platelets: 199 10*3/uL (ref 145–400)
RBC: 3.55 10*6/uL — AB (ref 3.70–5.45)
RDW: 19.1 % — ABNORMAL HIGH (ref 11.2–14.5)
WBC: 8 10*3/uL (ref 3.9–10.3)
lymph#: 0.6 10*3/uL — ABNORMAL LOW (ref 0.9–3.3)

## 2016-09-04 LAB — TECHNOLOGIST REVIEW

## 2016-09-04 MED ORDER — LENALIDOMIDE 15 MG PO CAPS: 15.0000 mg | ORAL_CAPSULE | Freq: Every day | ORAL | 0 refills | Status: DC

## 2016-09-04 MED ORDER — ONDANSETRON HCL 8 MG PO TABS: 8.0000 mg | ORAL_TABLET | Freq: Once | ORAL | Status: DC

## 2016-09-04 MED ORDER — BORTEZOMIB CHEMO SQ INJECTION 3.5 MG (2.5MG/ML)
1.3000 mg/m2 | Freq: Once | INTRAMUSCULAR | Status: AC
  Administered 2016-09-04: 2.5 mg via SUBCUTANEOUS
  Filled 2016-09-04: qty 2.5

## 2016-09-04 NOTE — Patient Instructions (Signed)
[facility] Discharge Instructions for Patients Receiving Chemotherapy  Today you received the following chemotherapy agents Velcade. To help prevent nausea and vomiting after your treatment, we encourage you to take your nausea medication as directed.  If you develop nausea and vomiting that is not controlled by your nausea medication, call the clinic.   BELOW ARE SYMPTOMS THAT SHOULD BE REPORTED IMMEDIATELY:  *FEVER GREATER THAN 100.5 F  *CHILLS WITH OR WITHOUT FEVER  NAUSEA AND VOMITING THAT IS NOT CONTROLLED WITH YOUR NAUSEA MEDICATION  *UNUSUAL SHORTNESS OF BREATH  *UNUSUAL BRUISING OR BLEEDING  TENDERNESS IN MOUTH AND THROAT WITH OR WITHOUT PRESENCE OF ULCERS  *URINARY PROBLEMS  *BOWEL PROBLEMS  UNUSUAL RASH Items with * indicate a potential emergency and should be followed up as soon as possible.  Feel free to call the clinic you have any questions or concerns. The clinic phone number is [phone].  Please show the CHEMO ALERT CARD at check-in to the Emergency Department and triage nurse.    

## 2016-09-04 NOTE — Progress Notes (Signed)
Called vasc. lab for doppler to be scheduled. No availabilities open today. Pt scheduled for 1pm on 09/05/16, Called and informed infusion nurse Tim who will inform pt.

## 2016-09-04 NOTE — Progress Notes (Signed)
  [place] OFFICE PROGRESS NOTE   Diagnosis:  Multiple myeloma  INTERVAL HISTORY:   Abigail [name] returns as scheduled. She continues Velcade. She began Revlimid 08/22/2016. She denies nausea/vomiting. No mouth ulcers. She notes that the corners of the lips are "sore". No shortness of breath or chest pain. No diarrhea. She has noted swelling of the right leg since last week. No calf pain.  Objective:  Vital signs in last 24 hours:  Blood pressure (!) 147/63, pulse 69, temperature 98.8 F (37.1 C), temperature source Oral, resp. rate 17, height 5' 5.5" (1.664 m), weight 181 lb (82.1 kg), SpO2 99 %.    HEENT: Small linear ulcerations bilateral corner of lips. No ulcers within the mouth. Resp: Lungs clear bilaterally. Cardio: Regular rate and rhythm. GI: Abdomen soft and nontender. No organomegaly. Vascular: Pitting edema at the lower legs bilaterally right greater than left. Calves soft and nontender.   Lab Results:  Lab Results  Component Value Date   WBC 8.0 09/04/2016   HGB 10.7 (L) 09/04/2016   HCT 33.1 (L) 09/04/2016   MCV 93.3 09/04/2016   PLT 199 09/04/2016   NEUTROABS 6.6 (H) 09/04/2016    Imaging:  No results found.  Medications: I have reviewed the patient's current medications.  Assessment/Plan: 1. Multiple myeloma, IgG lambda, bone marrow biopsy 06/15/2016 confirmed multiple myeloma; cytogenetics by FISHshow +11, +12, 13q-  Elevated serum free lambda light chains  Lambda light chain proteinuria  Lytic bone lesions on a bone survey 06/13/2016  Initiation of weekly Velcade/Decadron 06/19/2016  Serum light chains improved 07/31/2016  Initiation of Revlimid 08/22/2016  2. Severe anemia secondary to #1  3. 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 History of coronary artery disease/myocardial infarction  5. History of colon polyps   Disposition:  Abigail [name] appears stable. The plan is to continue Velcade/Decadron with a schedule change from weekly to weekly 3 followed by a one-week break. She will continue Revlimid 14 days on/14 days off.  She has recently noted right leg edema. On exam she has bilateral lower extremity edema right greater than left. The edema may be related to steroids but need to rule out DVT. We are referring her for bilateral lower extremity Doppler studies today.  She will receive Velcade today. She will be off of treatment next week. She will return for labs, a follow-up visit and to begin the next cycle of RVD on 09/18/2016. She will contact the office in the interim with any problems.  Plan reviewed with Dr. Benay Spice. 25 minutes were spent face-to-face at today's visit with the majority of that time involved in counseling/coordination of care.    [name] ANP/GNP-BC   09/04/2016  12:12 PM

## 2016-09-04 NOTE — Telephone Encounter (Signed)
Appointments scheduled per 11/16 LOS. Patient given AVS report and calendars with future scheduled appointments. ° °

## 2016-09-05 ENCOUNTER — Ambulatory Visit (HOSPITAL_COMMUNITY)
Admission: RE | Admit: 2016-09-05 | Discharge: 2016-09-05 | Disposition: A | Discharge status: Home / Self Care | Payer: Medicare HMO | Source: Ambulatory Visit | Attending: Nurse Practitioner | Admitting: Nurse Practitioner

## 2016-09-05 DIAGNOSIS — C9000 Multiple myeloma not having achieved remission: Secondary | ICD-10-CM | POA: Diagnosis present

## 2016-09-05 DIAGNOSIS — R600 Localized edema: Secondary | ICD-10-CM | POA: Diagnosis not present

## 2016-09-05 NOTE — Progress Notes (Signed)
VASCULAR LAB PRELIMINARY  PRELIMINARY  PRELIMINARY  PRELIMINARY  Bilateral lower extremity venous duplex completed.    Preliminary report: Bilateral:  No evidence of DVT, superficial thrombosis, or Baker's Cyst.   Circe Chilton, RVS 09/05/2016, 1:37 PM

## 2016-09-09 ENCOUNTER — Telehealth: Payer: Self-pay | Admitting: *Deleted

## 2016-09-09 NOTE — Telephone Encounter (Signed)
Call received from patient to confirm her Revlimid dose.  Per L. Thomas NP, patient informed that Revlimid dose is to be 14 days on and 14 days off. Patient appreciative of information and has no further questions at this time.

## 2016-09-14 ENCOUNTER — Other Ambulatory Visit: Payer: Self-pay | Admitting: Oncology

## 2016-09-17 ENCOUNTER — Telehealth: Payer: Self-pay | Admitting: *Deleted

## 2016-09-17 NOTE — Telephone Encounter (Signed)
Call from pt reporting swelling in face and eyes. BLE edema persists. Thinks she is having "an allergic reaction" to Revlimid. Palmar pruritis began on 09/16/16. Running warm water on hands seems to help the itching. Pt requesting to see NP. Discussed with [name], NP: Work in for office visit 11/30.

## 2016-09-18 ENCOUNTER — Other Ambulatory Visit (HOSPITAL_BASED_OUTPATIENT_CLINIC_OR_DEPARTMENT_OTHER): Payer: Medicare HMO

## 2016-09-18 ENCOUNTER — Ambulatory Visit: Payer: Medicare HMO

## 2016-09-18 ENCOUNTER — Ambulatory Visit (HOSPITAL_BASED_OUTPATIENT_CLINIC_OR_DEPARTMENT_OTHER): Payer: Medicare HMO

## 2016-09-18 ENCOUNTER — Ambulatory Visit (HOSPITAL_BASED_OUTPATIENT_CLINIC_OR_DEPARTMENT_OTHER): Payer: Medicare HMO | Admitting: Nurse Practitioner

## 2016-09-18 VITALS — BP 159/64 | HR 67 | Temp 98.6°F | Resp 18 | Ht 65.5 in | Wt 188.7 lb

## 2016-09-18 DIAGNOSIS — C9000 Multiple myeloma not having achieved remission: Secondary | ICD-10-CM

## 2016-09-18 DIAGNOSIS — D630 Anemia in neoplastic disease: Secondary | ICD-10-CM

## 2016-09-18 DIAGNOSIS — Z5112 Encounter for antineoplastic immunotherapy: Secondary | ICD-10-CM

## 2016-09-18 DIAGNOSIS — R609 Edema, unspecified: Secondary | ICD-10-CM

## 2016-09-18 LAB — CBC WITH DIFFERENTIAL/PLATELET
BASO%: 0.6 % (ref 0.0–2.0)
Basophils Absolute: 0 10*3/uL (ref 0.0–0.1)
EOS ABS: 0.1 10*3/uL (ref 0.0–0.5)
EOS%: 2.1 % (ref 0.0–7.0)
HCT: 29.6 % — ABNORMAL LOW (ref 34.8–46.6)
HGB: 9.5 g/dL — ABNORMAL LOW (ref 11.6–15.9)
LYMPH%: 9.5 % — AB (ref 14.0–49.7)
MCH: 30.3 pg (ref 25.1–34.0)
MCHC: 32.1 g/dL (ref 31.5–36.0)
MCV: 94.3 fL (ref 79.5–101.0)
MONO#: 0.6 10*3/uL (ref 0.1–0.9)
MONO%: 8.2 % (ref 0.0–14.0)
NEUT%: 79.6 % — AB (ref 38.4–76.8)
NEUTROS ABS: 5.4 10*3/uL (ref 1.5–6.5)
PLATELETS: 239 10*3/uL (ref 145–400)
RBC: 3.14 10*6/uL — AB (ref 3.70–5.45)
RDW: 18.4 % — ABNORMAL HIGH (ref 11.2–14.5)
WBC: 6.8 10*3/uL (ref 3.9–10.3)
lymph#: 0.7 10*3/uL — ABNORMAL LOW (ref 0.9–3.3)

## 2016-09-18 LAB — COMPREHENSIVE METABOLIC PANEL
ALT: 18 U/L (ref 0–55)
ANION GAP: 9 meq/L (ref 3–11)
AST: 15 U/L (ref 5–34)
Albumin: 3.6 g/dL (ref 3.5–5.0)
Alkaline Phosphatase: 214 U/L — ABNORMAL HIGH (ref 40–150)
BUN: 12.7 mg/dL (ref 7.0–26.0)
CHLORIDE: 114 meq/L — AB (ref 98–109)
CO2: 18 meq/L — AB (ref 22–29)
Calcium: 9.1 mg/dL (ref 8.4–10.4)
Creatinine: 0.8 mg/dL (ref 0.6–1.1)
EGFR: 87 mL/min/{1.73_m2} — AB (ref >90)
GLUCOSE: 102 mg/dL (ref 70–140)
Potassium: 3.9 mEq/L (ref 3.5–5.1)
SODIUM: 141 meq/L (ref 136–145)
Total Bilirubin: 0.63 mg/dL (ref 0.20–1.20)
Total Protein: 7.3 g/dL (ref 6.4–8.3)

## 2016-09-18 MED ORDER — ZOLEDRONIC ACID 4 MG/100ML IV SOLN
4.0000 mg | Freq: Once | INTRAVENOUS | Status: AC
  Administered 2016-09-18: 4 mg via INTRAVENOUS
  Filled 2016-09-18: qty 100

## 2016-09-18 MED ORDER — ONDANSETRON HCL 8 MG PO TABS: 8.0000 mg | ORAL_TABLET | Freq: Once | ORAL | Status: DC

## 2016-09-18 MED ORDER — SODIUM CHLORIDE 0.9 % IV SOLN
Freq: Once | INTRAVENOUS | Status: AC
  Administered 2016-09-18: 14:00:00 via INTRAVENOUS

## 2016-09-18 MED ORDER — BORTEZOMIB CHEMO SQ INJECTION 3.5 MG (2.5MG/ML)
1.3000 mg/m2 | Freq: Once | INTRAMUSCULAR | Status: AC
  Administered 2016-09-18: 2.5 mg via SUBCUTANEOUS
  Filled 2016-09-18: qty 2.5

## 2016-09-18 NOTE — Progress Notes (Addendum)
[facility] OFFICE PROGRESS NOTE   Diagnosis:  Multiple myeloma  INTERVAL HISTORY:   Abigail [name] is seen prior to scheduled follow-up. She resumed Revlimid 09/12/2016. A few days later she noted "itchy palms and red swollen eyes". No rash. No shortness of breath or chest pain. No fever. No diarrhea. She notes increased leg edema. She is concerned an old abdominal scar is "opening up".  Objective:  Vital signs in last 24 hours:  Blood pressure (!) 159/64, pulse 67, temperature 98.6 F (37 C), temperature source Oral, resp. rate 18, height 5' 5.5" (1.664 m), weight 188 lb 11.2 oz (85.6 kg), SpO2 99 %.    HEENT: Mild periorbital edema. Question early stye formation left upper inner lid margin and right upper outer lid margin. Resp: Lungs clear bilaterally. Cardio: Regular rate and rhythm. GI: Abdomen soft and nontender. No hepatomegaly. Vascular: Pitting edema at the lower legs bilaterally. Neuro: Alert and oriented.  Skin: Palms without erythema. No skin rash. Right abdomen skin fold with an approximate 1 inch area of very superficial ulceration.    Lab Results:  Lab Results  Component Value Date   WBC 6.8 09/18/2016   HGB 9.5 (L) 09/18/2016   HCT 29.6 (L) 09/18/2016   MCV 94.3 09/18/2016   PLT 239 09/18/2016   NEUTROABS 5.4 09/18/2016    Imaging:  No results found.  Medications: I have reviewed the patient's current medications.  Assessment/Plan: 1. Multiple myeloma, IgG lambda, bone marrow biopsy 06/15/2016 confirmed multiple myeloma; cytogenetics by FISHshow +11, +12, 13q-  Elevated serum free lambda light chains  Lambda light chain proteinuria  Lytic bone lesions on a bone survey 06/13/2016  Initiation of weekly Velcade/Decadron 06/19/2016  Serum light chains improved 07/31/2016  Initiation of Revlimid 08/22/2016 2 weeks on/2 weeks off  2. Severe anemia secondary to #1  3. 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 History of coronary artery disease/myocardial infarction  5. History of colon polyps  6. Bilateral leg edema 09/04/2016. Negative bilateral venous Doppler 09/05/2016.  7. Mild periorbital edema 09/18/2016, question related to early stye formation, question Velcade related chalazia.   Disposition: Abigail [name] appears stable. Plan to proceed with Velcade today as scheduled. She will continue Revlimid to complete 14 days.  The lower extremity edema is most likely related to steroids. We will decrease the dexamethasone from 40 mg weekly to 20 mg weekly. She will elevate the legs as possible. She had bilateral venous Doppler studies done 09/05/2016 with no evidence of DVT.  The mild periorbital edema may be related to early stye formation. Also need to consider development of Velcade related chalazia. She will apply warm moist compresses.  She has a small area of skin breakdown in an abdominal skin fold. She will try to keep the area clean and dry.  She will return for Velcade in one week. We will see her in follow-up in 2 weeks. She will contact the office in the interim with further problems.  Patient seen with Dr. Sherrill. 25 minutes were spent face-to-face at today's visit with the majority of that time involved in counseling/coordination of care.    Thomas, Lisa ANP/GNP-BC   09/18/2016  12:11 PM This was a shared visit with Lisa Thomas. Abigail [name] was interviewed and examined. She may be developing early chalazia at the right eyelid. We decided to continue Velcade for now. She will return for an office visit in   2 weeks.  Julieanne Manson, M.D.

## 2016-09-18 NOTE — Patient Instructions (Signed)
[facility] Discharge Instructions for Patients Receiving Chemotherapy  Today you received the following chemotherapy agents: Velcade   To help prevent nausea and vomiting after your treatment, we encourage you to take your nausea medication as directed.    If you develop nausea and vomiting that is not controlled by your nausea medication, call the clinic.   BELOW ARE SYMPTOMS THAT SHOULD BE REPORTED IMMEDIATELY:  *FEVER GREATER THAN 100.5 F  *CHILLS WITH OR WITHOUT FEVER  NAUSEA AND VOMITING THAT IS NOT CONTROLLED WITH YOUR NAUSEA MEDICATION  *UNUSUAL SHORTNESS OF BREATH  *UNUSUAL BRUISING OR BLEEDING  TENDERNESS IN MOUTH AND THROAT WITH OR WITHOUT PRESENCE OF ULCERS  *URINARY PROBLEMS  *BOWEL PROBLEMS  UNUSUAL RASH Items with * indicate a potential emergency and should be followed up as soon as possible.  Feel free to call the clinic you have any questions or concerns. The clinic phone number is [phone].  Please show the CHEMO ALERT CARD at check-in to the Emergency Department and triage nurse.    Zoledronic Acid injection (Hypercalcemia, Oncology) What is this medicine? ZOLEDRONIC ACID (ZOE le dron ik AS id) lowers the amount of calcium loss from bone. It is used to treat too much calcium in your blood from cancer. It is also used to prevent complications of cancer that has spread to the bone. This medicine may be used for other purposes; ask your health care provider or pharmacist if you have questions. COMMON BRAND NAME(S): Zometa What should I tell my health care provider before I take this medicine? They need to know if you have any of these conditions: -aspirin-sensitive asthma -cancer, especially if you are receiving medicines used to treat cancer -dental disease or wear dentures -infection -kidney disease -receiving corticosteroids like dexamethasone or prednisone -an unusual or allergic reaction to zoledronic acid, other medicines,  foods, dyes, or preservatives -pregnant or trying to get pregnant -breast-feeding How should I use this medicine? This medicine is for infusion into a vein. It is given by a health care professional in a hospital or clinic setting. Talk to your pediatrician regarding the use of this medicine in children. Special care may be needed. Overdosage: If you think you have taken too much of this medicine contact a poison control center or emergency room at once. NOTE: This medicine is only for you. Do not share this medicine with others. What if I miss a dose? It is important not to miss your dose. Call your doctor or health care professional if you are unable to keep an appointment. What may interact with this medicine? -certain antibiotics given by injection -NSAIDs, medicines for pain and inflammation, like ibuprofen or naproxen -some diuretics like bumetanide, furosemide -teriparatide -thalidomide This list may not describe all possible interactions. Give your health care provider a list of all the medicines, herbs, non-prescription drugs, or dietary supplements you use. Also tell them if you smoke, drink alcohol, or use illegal drugs. Some items may interact with your medicine. What should I watch for while using this medicine? Visit your doctor or health care professional for regular checkups. It may be some time before you see the benefit from this medicine. Do not stop taking your medicine unless your doctor tells you to. Your doctor may order blood tests or other tests to see how you are doing. Women should inform their doctor if they wish to become pregnant or think they might be pregnant. There is a potential for serious side effects to an unborn   your health care professional or pharmacist for more information. You should make sure that you get enough calcium and vitamin D while you are taking this medicine. Discuss the foods you eat and the vitamins you take with your health care  professional. Some people who take this medicine have severe bone, joint, and/or muscle pain. This medicine may also increase your risk for jaw problems or a broken thigh bone. Tell your doctor right away if you have severe pain in your jaw, bones, joints, or muscles. Tell your doctor if you have any pain that does not go away or that gets worse. Tell your dentist and dental surgeon that you are taking this medicine. You should not have major dental surgery while on this medicine. See your dentist to have a dental exam and fix any dental problems before starting this medicine. Take good care of your teeth while on this medicine. Make sure you see your dentist for regular follow-up appointments. What side effects may I notice from receiving this medicine? Side effects that you should report to your doctor or health care professional as soon as possible: -allergic reactions like skin rash, itching or hives, swelling of the face, lips, or tongue -anxiety, confusion, or depression -breathing problems -changes in vision -eye pain -feeling faint or lightheaded, falls -jaw pain, especially after dental work -mouth sores -muscle cramps, stiffness, or weakness -redness, blistering, peeling or loosening of the skin, including inside the mouth -trouble passing urine or change in the amount of urine Side effects that usually do not require medical attention (report to your doctor or health care professional if they continue or are bothersome): -bone, joint, or muscle pain -constipation -diarrhea -fever -hair loss -irritation at site where injected -loss of appetite -nausea, vomiting -stomach upset -trouble sleeping -trouble swallowing -weak or tired This list may not describe all possible side effects. Call your doctor for medical advice about side effects. You may report side effects to FDA at 1-800-FDA-1088. Where should I keep my medicine? This drug is given in a hospital or clinic and will not  be stored at home. NOTE: This sheet is a summary. It may not cover all possible information. If you have questions about this medicine, talk to your doctor, pharmacist, or health care provider.  2017 Elsevier/Gold Standard (2014-03-04 14:19:39)    

## 2016-09-21 ENCOUNTER — Other Ambulatory Visit: Payer: Self-pay | Admitting: Oncology

## 2016-09-25 ENCOUNTER — Other Ambulatory Visit: Payer: Self-pay | Admitting: Cardiovascular Disease

## 2016-09-25 ENCOUNTER — Ambulatory Visit: Payer: Medicare HMO

## 2016-09-25 ENCOUNTER — Ambulatory Visit (HOSPITAL_BASED_OUTPATIENT_CLINIC_OR_DEPARTMENT_OTHER): Payer: Medicare HMO

## 2016-09-25 ENCOUNTER — Other Ambulatory Visit (HOSPITAL_BASED_OUTPATIENT_CLINIC_OR_DEPARTMENT_OTHER): Payer: Medicare HMO

## 2016-09-25 ENCOUNTER — Other Ambulatory Visit: Payer: Self-pay | Admitting: Internal Medicine

## 2016-09-25 VITALS — BP 161/62 | HR 57 | Temp 98.8°F | Resp 18 | Wt 174.2 lb

## 2016-09-25 DIAGNOSIS — C9000 Multiple myeloma not having achieved remission: Secondary | ICD-10-CM

## 2016-09-25 DIAGNOSIS — Z5112 Encounter for antineoplastic immunotherapy: Secondary | ICD-10-CM

## 2016-09-25 DIAGNOSIS — N6310 Unspecified lump in the right breast, unspecified quadrant: Secondary | ICD-10-CM

## 2016-09-25 LAB — CBC WITH DIFFERENTIAL/PLATELET
BASO%: 0.5 % (ref 0.0–2.0)
Basophils Absolute: 0 10*3/uL (ref 0.0–0.1)
EOS%: 1.5 % (ref 0.0–7.0)
Eosinophils Absolute: 0.1 10*3/uL (ref 0.0–0.5)
HCT: 31.9 % — ABNORMAL LOW (ref 34.8–46.6)
HGB: 10 g/dL — ABNORMAL LOW (ref 11.6–15.9)
LYMPH%: 10 % — ABNORMAL LOW (ref 14.0–49.7)
MCH: 29.3 pg (ref 25.1–34.0)
MCHC: 31.3 g/dL — ABNORMAL LOW (ref 31.5–36.0)
MCV: 93.5 fL (ref 79.5–101.0)
MONO#: 0.5 10*3/uL (ref 0.1–0.9)
MONO%: 6.6 % (ref 0.0–14.0)
NEUT#: 6.4 10*3/uL (ref 1.5–6.5)
NEUT%: 81.4 % — ABNORMAL HIGH (ref 38.4–76.8)
Platelets: 238 10*3/uL (ref 145–400)
RBC: 3.41 10*6/uL — ABNORMAL LOW (ref 3.70–5.45)
RDW: 17.5 % — ABNORMAL HIGH (ref 11.2–14.5)
WBC: 7.8 10*3/uL (ref 3.9–10.3)
lymph#: 0.8 10*3/uL — ABNORMAL LOW (ref 0.9–3.3)

## 2016-09-25 LAB — BASIC METABOLIC PANEL
Anion Gap: 8 mEq/L (ref 3–11)
BUN: 17.7 mg/dL (ref 7.0–26.0)
CHLORIDE: 116 meq/L — AB (ref 98–109)
CO2: 17 meq/L — AB (ref 22–29)
CREATININE: 1.5 mg/dL — AB (ref 0.6–1.1)
Calcium: 8.9 mg/dL (ref 8.4–10.4)
EGFR: 42 mL/min/{1.73_m2} — ABNORMAL LOW (ref >90)
Glucose: 105 mg/dl (ref 70–140)
Potassium: 3.9 mEq/L (ref 3.5–5.1)
Sodium: 141 mEq/L (ref 136–145)

## 2016-09-25 MED ORDER — ONDANSETRON HCL 8 MG PO TABS: 8.0000 mg | ORAL_TABLET | Freq: Once | ORAL | Status: DC

## 2016-09-25 MED ORDER — ONDANSETRON HCL 8 MG PO TABS
ORAL_TABLET | ORAL | Status: AC
  Filled 2016-09-25: qty 1

## 2016-09-25 MED ORDER — BORTEZOMIB CHEMO SQ INJECTION 3.5 MG (2.5MG/ML)
1.3000 mg/m2 | Freq: Once | INTRAMUSCULAR | Status: AC
  Administered 2016-09-25: 2.5 mg via SUBCUTANEOUS
  Filled 2016-09-25: qty 2.5

## 2016-09-25 NOTE — Patient Instructions (Signed)
[facility] Discharge Instructions for Patients Receiving Chemotherapy  Today you received the following chemotherapy agents Velcade. To help prevent nausea and vomiting after your treatment, we encourage you to take your nausea medication as directed.  If you develop nausea and vomiting that is not controlled by your nausea medication, call the clinic.   BELOW ARE SYMPTOMS THAT SHOULD BE REPORTED IMMEDIATELY:  *FEVER GREATER THAN 100.5 F  *CHILLS WITH OR WITHOUT FEVER  NAUSEA AND VOMITING THAT IS NOT CONTROLLED WITH YOUR NAUSEA MEDICATION  *UNUSUAL SHORTNESS OF BREATH  *UNUSUAL BRUISING OR BLEEDING  TENDERNESS IN MOUTH AND THROAT WITH OR WITHOUT PRESENCE OF ULCERS  *URINARY PROBLEMS  *BOWEL PROBLEMS  UNUSUAL RASH Items with * indicate a potential emergency and should be followed up as soon as possible.  Feel free to call the clinic you have any questions or concerns. The clinic phone number is [phone].  Please show the CHEMO ALERT CARD at check-in to the Emergency Department and triage nurse.    

## 2016-09-25 NOTE — Progress Notes (Signed)
OK to treat with creat 1.5 per Dr. Benay Spice.

## 2016-09-26 LAB — IGG: IgG, Qn, Serum: 900 mg/dL (ref 700–1600)

## 2016-09-28 ENCOUNTER — Other Ambulatory Visit: Payer: Self-pay | Admitting: Oncology

## 2016-09-29 LAB — PROTEIN ELECTROPHORESIS, SERUM
A/G Ratio: 1.3 (ref 0.7–1.7)
ALPHA 1: 0.3 g/dL (ref 0.0–0.4)
ALPHA 2: 0.8 g/dL (ref 0.4–1.0)
Albumin: 3.6 g/dL (ref 2.9–4.4)
Beta: 0.9 g/dL (ref 0.7–1.3)
GAMMA GLOBULIN: 0.8 g/dL (ref 0.4–1.8)
GLOBULIN, TOTAL: 2.8 g/dL (ref 2.2–3.9)
M-SPIKE, %: 0.6 g/dL — AB
Total Protein: 6.4 g/dL (ref 6.0–8.5)

## 2016-10-02 ENCOUNTER — Ambulatory Visit (HOSPITAL_BASED_OUTPATIENT_CLINIC_OR_DEPARTMENT_OTHER): Payer: Medicare HMO

## 2016-10-02 ENCOUNTER — Ambulatory Visit (HOSPITAL_BASED_OUTPATIENT_CLINIC_OR_DEPARTMENT_OTHER): Payer: Medicare HMO | Admitting: Oncology

## 2016-10-02 ENCOUNTER — Other Ambulatory Visit (HOSPITAL_BASED_OUTPATIENT_CLINIC_OR_DEPARTMENT_OTHER): Payer: Medicare HMO

## 2016-10-02 ENCOUNTER — Ambulatory Visit: Payer: Medicare HMO

## 2016-10-02 VITALS — BP 148/73 | HR 87 | Temp 98.5°F | Resp 18 | Wt 169.5 lb

## 2016-10-02 DIAGNOSIS — Z5112 Encounter for antineoplastic immunotherapy: Secondary | ICD-10-CM

## 2016-10-02 DIAGNOSIS — D630 Anemia in neoplastic disease: Secondary | ICD-10-CM

## 2016-10-02 DIAGNOSIS — C9000 Multiple myeloma not having achieved remission: Secondary | ICD-10-CM | POA: Diagnosis not present

## 2016-10-02 LAB — CBC WITH DIFFERENTIAL/PLATELET
BASO%: 0.2 % (ref 0.0–2.0)
Basophils Absolute: 0 10*3/uL (ref 0.0–0.1)
EOS%: 0.2 % (ref 0.0–7.0)
Eosinophils Absolute: 0 10*3/uL (ref 0.0–0.5)
HEMATOCRIT: 32.6 % — AB (ref 34.8–46.6)
HEMOGLOBIN: 10.6 g/dL — AB (ref 11.6–15.9)
LYMPH#: 0.6 10*3/uL — AB (ref 0.9–3.3)
LYMPH%: 7 % — ABNORMAL LOW (ref 14.0–49.7)
MCH: 30.3 pg (ref 25.1–34.0)
MCHC: 32.5 g/dL (ref 31.5–36.0)
MCV: 93.1 fL (ref 79.5–101.0)
MONO#: 0.4 10*3/uL (ref 0.1–0.9)
MONO%: 4.7 % (ref 0.0–14.0)
NEUT#: 7.5 10*3/uL — ABNORMAL HIGH (ref 1.5–6.5)
NEUT%: 87.9 % — AB (ref 38.4–76.8)
PLATELETS: 185 10*3/uL (ref 145–400)
RBC: 3.5 10*6/uL — ABNORMAL LOW (ref 3.70–5.45)
RDW: 17.6 % — AB (ref 11.2–14.5)
WBC: 8.5 10*3/uL (ref 3.9–10.3)

## 2016-10-02 LAB — COMPREHENSIVE METABOLIC PANEL
ALBUMIN: 3.6 g/dL (ref 3.5–5.0)
ALK PHOS: 158 U/L — AB (ref 40–150)
ALT: 8 U/L (ref 0–55)
ANION GAP: 9 meq/L (ref 3–11)
AST: 9 U/L (ref 5–34)
BILIRUBIN TOTAL: 0.48 mg/dL (ref 0.20–1.20)
BUN: 12.7 mg/dL (ref 7.0–26.0)
CALCIUM: 8.6 mg/dL (ref 8.4–10.4)
CO2: 14 mEq/L — ABNORMAL LOW (ref 22–29)
CREATININE: 1.5 mg/dL — AB (ref 0.6–1.1)
Chloride: 118 mEq/L — ABNORMAL HIGH (ref 98–109)
EGFR: 40 mL/min/{1.73_m2} — ABNORMAL LOW (ref >90)
Glucose: 126 mg/dl (ref 70–140)
Potassium: 3.3 mEq/L — ABNORMAL LOW (ref 3.5–5.1)
Sodium: 141 mEq/L (ref 136–145)
TOTAL PROTEIN: 7.2 g/dL (ref 6.4–8.3)

## 2016-10-02 MED ORDER — ONDANSETRON HCL 8 MG PO TABS: 8.0000 mg | ORAL_TABLET | Freq: Once | ORAL | Status: DC

## 2016-10-02 MED ORDER — ONDANSETRON HCL 8 MG PO TABS
ORAL_TABLET | ORAL | Status: AC
  Filled 2016-10-02: qty 1

## 2016-10-02 MED ORDER — BORTEZOMIB CHEMO SQ INJECTION 3.5 MG (2.5MG/ML)
1.3000 mg/m2 | Freq: Once | INTRAMUSCULAR | Status: AC
  Administered 2016-10-02: 2.5 mg via SUBCUTANEOUS
  Filled 2016-10-02: qty 2.5

## 2016-10-02 NOTE — Progress Notes (Signed)
  [facility] OFFICE PROGRESS NOTE   Diagnosis: Multiple myeloma  INTERVAL HISTORY:   Abigail [name] returns as scheduled. She feels well. The eye inflammation has resolved. The leg swelling has resolved. She reports a good appetite. No nausea or diarrhea. Stable tingling in the fingers that predated chemotherapy. She completed the current cycle of Revlimid on 09/26/2016.  Objective:  Vital signs in last 24 hours:  Blood pressure (!) 148/73, pulse 87, temperature 98.5 F (36.9 C), temperature source Oral, resp. rate 18, weight 169 lb 8 oz (76.9 kg), SpO2 100 %.    HEENT: No thrush, no apparent eyelid inflammation Resp: Lungs clear bilaterally Cardio: Regular rate and rhythm GI: No hepatomegaly, no mass, nontender Vascular: No leg edema   Lab Results:  Lab Results  Component Value Date   WBC 8.5 1Dec 28, 202017   HGB 10.6 (L) 1Dec 28, 202017   HCT 32.6 (L) 1Dec 28, 202017   MCV 93.1 1Dec 28, 202017   PLT 185 1Dec 28, 202017   NEUTROABS 7.5 (H) 1Dec 28, 202017   Creatinine 1.5, potassium 3.3  Medications: I have reviewed the patient's current medications.  Assessment/Plan: 1. Multiple myeloma, IgG lambda, bone marrow biopsy 06/15/2016 confirmed multiple myeloma; cytogenetics by FISHshow +11, +12, 13q-  Elevated serum free lambda light chains  Lambda light chain proteinuria  Lytic bone lesions on a bone survey 06/13/2016  Initiation of weekly Velcade/Decadron 06/19/2016  Serum light chains improved 07/31/2016  Initiation of Revlimid 08/22/2016 2 weeks on/2 weeks off  2. Severe anemia secondary to #1  3. 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 History of coronary artery disease/myocardial infarction  5. History of colon polyps  6. Bilateral leg edema 09/04/2016. Negative bilateral venous Doppler 09/05/2016.  7. Mild periorbital edema 09/18/2016, question related to early stye  formation, question Velcade related chalazia.    Disposition:  Abigail [name] appears well. The serum M spike and IgG level are much improved. The plan is to continue RVD therapy. She will take Revlimid for 2 weeks on and 2 weeks off. Velcade/Decadron will be given 3 out of 4 weeks.  The etiology of the bump in the creatinine is unclear. We will monitor the creatinine. She will begin the next cycle of Revlimid and Velcade on 10/16/2016.  [name], MD  1Dec 28, 202017  4:25 PM

## 2016-10-02 NOTE — Patient Instructions (Signed)
[facility] Discharge Instructions for Patients Receiving Chemotherapy  Today you received the following chemotherapy agents Velcade. To help prevent nausea and vomiting after your treatment, we encourage you to take your nausea medication as directed.  If you develop nausea and vomiting that is not controlled by your nausea medication, call the clinic.   BELOW ARE SYMPTOMS THAT SHOULD BE REPORTED IMMEDIATELY:  *FEVER GREATER THAN 100.5 F  *CHILLS WITH OR WITHOUT FEVER  NAUSEA AND VOMITING THAT IS NOT CONTROLLED WITH YOUR NAUSEA MEDICATION  *UNUSUAL SHORTNESS OF BREATH  *UNUSUAL BRUISING OR BLEEDING  TENDERNESS IN MOUTH AND THROAT WITH OR WITHOUT PRESENCE OF ULCERS  *URINARY PROBLEMS  *BOWEL PROBLEMS  UNUSUAL RASH Items with * indicate a potential emergency and should be followed up as soon as possible.  Feel free to call the clinic you have any questions or concerns. The clinic phone number is [phone].  Please show the CHEMO ALERT CARD at check-in to the Emergency Department and triage nurse.    

## 2016-10-03 ENCOUNTER — Other Ambulatory Visit: Payer: Self-pay | Admitting: *Deleted

## 2016-10-03 DIAGNOSIS — C9000 Multiple myeloma not having achieved remission: Secondary | ICD-10-CM

## 2016-10-03 MED ORDER — LENALIDOMIDE 15 MG PO CAPS: 15.0000 mg | ORAL_CAPSULE | Freq: Every day | ORAL | 0 refills | Status: DC

## 2016-10-14 ENCOUNTER — Other Ambulatory Visit: Payer: Self-pay | Admitting: Cardiovascular Disease

## 2016-10-14 ENCOUNTER — Ambulatory Visit
Admission: RE | Admit: 2016-10-14 | Discharge: 2016-10-14 | Disposition: A | Discharge status: Home / Self Care | Payer: Medicare HMO | Source: Ambulatory Visit | Attending: Cardiovascular Disease | Admitting: Cardiovascular Disease

## 2016-10-14 ENCOUNTER — Telehealth: Payer: Self-pay | Admitting: Oncology

## 2016-10-14 DIAGNOSIS — N6310 Unspecified lump in the right breast, unspecified quadrant: Secondary | ICD-10-CM

## 2016-10-14 NOTE — Telephone Encounter (Signed)
Patient called to confirm next appointment. Gave patient appointment for 12/28 and patient to get new schedule 12/28.

## 2016-10-16 ENCOUNTER — Other Ambulatory Visit: Payer: Self-pay | Admitting: *Deleted

## 2016-10-16 ENCOUNTER — Telehealth: Payer: Self-pay | Admitting: *Deleted

## 2016-10-16 ENCOUNTER — Ambulatory Visit (HOSPITAL_BASED_OUTPATIENT_CLINIC_OR_DEPARTMENT_OTHER): Payer: Medicare HMO

## 2016-10-16 ENCOUNTER — Other Ambulatory Visit: Payer: Self-pay | Admitting: Hematology & Oncology

## 2016-10-16 ENCOUNTER — Other Ambulatory Visit (HOSPITAL_BASED_OUTPATIENT_CLINIC_OR_DEPARTMENT_OTHER): Payer: Medicare HMO

## 2016-10-16 VITALS — BP 139/56 | HR 43 | Temp 98.7°F | Resp 18

## 2016-10-16 DIAGNOSIS — C9000 Multiple myeloma not having achieved remission: Secondary | ICD-10-CM | POA: Diagnosis not present

## 2016-10-16 DIAGNOSIS — Z5112 Encounter for antineoplastic immunotherapy: Secondary | ICD-10-CM | POA: Diagnosis not present

## 2016-10-16 LAB — CBC WITH DIFFERENTIAL/PLATELET
BASO%: 1.6 % (ref 0.0–2.0)
Basophils Absolute: 0.1 10*3/uL (ref 0.0–0.1)
EOS%: 7.4 % — AB (ref 0.0–7.0)
Eosinophils Absolute: 0.4 10*3/uL (ref 0.0–0.5)
HEMATOCRIT: 33 % — AB (ref 34.8–46.6)
HEMOGLOBIN: 10.3 g/dL — AB (ref 11.6–15.9)
LYMPH#: 1 10*3/uL (ref 0.9–3.3)
LYMPH%: 21.4 % (ref 14.0–49.7)
MCH: 30 pg (ref 25.1–34.0)
MCHC: 31.2 g/dL — AB (ref 31.5–36.0)
MCV: 96.2 fL (ref 79.5–101.0)
MONO#: 0.3 10*3/uL (ref 0.1–0.9)
MONO%: 7 % (ref 0.0–14.0)
NEUT%: 62.6 % (ref 38.4–76.8)
NEUTROS ABS: 3 10*3/uL (ref 1.5–6.5)
PLATELETS: 236 10*3/uL (ref 145–400)
RBC: 3.43 10*6/uL — ABNORMAL LOW (ref 3.70–5.45)
RDW: 17 % — AB (ref 11.2–14.5)
WBC: 4.9 10*3/uL (ref 3.9–10.3)

## 2016-10-16 LAB — COMPREHENSIVE METABOLIC PANEL
ALBUMIN: 4 g/dL (ref 3.5–5.0)
ALK PHOS: 152 U/L — AB (ref 40–150)
ALT: 7 U/L (ref 0–55)
ANION GAP: 10 meq/L (ref 3–11)
AST: 11 U/L (ref 5–34)
BILIRUBIN TOTAL: 0.48 mg/dL (ref 0.20–1.20)
BUN: 27.1 mg/dL — AB (ref 7.0–26.0)
CALCIUM: 9.1 mg/dL (ref 8.4–10.4)
CO2: 19 mEq/L — ABNORMAL LOW (ref 22–29)
CREATININE: 1.2 mg/dL — AB (ref 0.6–1.1)
Chloride: 112 mEq/L — ABNORMAL HIGH (ref 98–109)
EGFR: 54 mL/min/{1.73_m2} — ABNORMAL LOW (ref >90)
Glucose: 95 mg/dl (ref 70–140)
Potassium: 4.5 mEq/L (ref 3.5–5.1)
Sodium: 142 mEq/L (ref 136–145)
TOTAL PROTEIN: 7 g/dL (ref 6.4–8.3)

## 2016-10-16 MED ORDER — ONDANSETRON HCL 8 MG PO TABS: 8.0000 mg | ORAL_TABLET | Freq: Once | ORAL | Status: DC

## 2016-10-16 MED ORDER — BORTEZOMIB CHEMO SQ INJECTION 3.5 MG (2.5MG/ML)
1.3000 mg/m2 | Freq: Once | INTRAMUSCULAR | Status: AC
  Administered 2016-10-16: 2.5 mg via SUBCUTANEOUS
  Filled 2016-10-16: qty 2.5

## 2016-10-16 NOTE — Telephone Encounter (Signed)
Called pt with instructions to decrease potassium to once daily. She voiced understanding.

## 2016-10-16 NOTE — Patient Instructions (Signed)
[facility] Discharge Instructions for Patients Receiving Chemotherapy  Today you received the following chemotherapy agents Velcade. To help prevent nausea and vomiting after your treatment, we encourage you to take your nausea medication as directed.  If you develop nausea and vomiting that is not controlled by your nausea medication, call the clinic.   BELOW ARE SYMPTOMS THAT SHOULD BE REPORTED IMMEDIATELY:  *FEVER GREATER THAN 100.5 F  *CHILLS WITH OR WITHOUT FEVER  NAUSEA AND VOMITING THAT IS NOT CONTROLLED WITH YOUR NAUSEA MEDICATION  *UNUSUAL SHORTNESS OF BREATH  *UNUSUAL BRUISING OR BLEEDING  TENDERNESS IN MOUTH AND THROAT WITH OR WITHOUT PRESENCE OF ULCERS  *URINARY PROBLEMS  *BOWEL PROBLEMS  UNUSUAL RASH Items with * indicate a potential emergency and should be followed up as soon as possible.  Feel free to call the clinic you have any questions or concerns. The clinic phone number is [phone].  Please show the CHEMO ALERT CARD at check-in to the Emergency Department and triage nurse.    

## 2016-10-16 NOTE — Telephone Encounter (Signed)
-----   Message from Ladell Pier, MD sent at 10/16/2016  4:24 PM EST ----- Please call patient, decrease potassium to once daily

## 2016-10-23 ENCOUNTER — Ambulatory Visit (HOSPITAL_BASED_OUTPATIENT_CLINIC_OR_DEPARTMENT_OTHER): Payer: Medicare HMO

## 2016-10-23 ENCOUNTER — Other Ambulatory Visit: Payer: Self-pay | Admitting: Oncology

## 2016-10-23 ENCOUNTER — Other Ambulatory Visit (HOSPITAL_BASED_OUTPATIENT_CLINIC_OR_DEPARTMENT_OTHER): Payer: Medicare HMO

## 2016-10-23 VITALS — BP 159/70 | HR 52 | Temp 97.8°F | Resp 18

## 2016-10-23 DIAGNOSIS — C9000 Multiple myeloma not having achieved remission: Secondary | ICD-10-CM | POA: Diagnosis not present

## 2016-10-23 DIAGNOSIS — Z5112 Encounter for antineoplastic immunotherapy: Secondary | ICD-10-CM | POA: Diagnosis not present

## 2016-10-23 LAB — CBC WITH DIFFERENTIAL/PLATELET
BASO%: 0.9 % (ref 0.0–2.0)
BASOS ABS: 0.1 10*3/uL (ref 0.0–0.1)
EOS ABS: 0.1 10*3/uL (ref 0.0–0.5)
EOS%: 1.8 % (ref 0.0–7.0)
HEMATOCRIT: 35.3 % (ref 34.8–46.6)
HEMOGLOBIN: 11.2 g/dL — AB (ref 11.6–15.9)
LYMPH#: 0.8 10*3/uL — AB (ref 0.9–3.3)
LYMPH%: 13.4 % — ABNORMAL LOW (ref 14.0–49.7)
MCH: 30.3 pg (ref 25.1–34.0)
MCHC: 31.8 g/dL (ref 31.5–36.0)
MCV: 95.3 fL (ref 79.5–101.0)
MONO#: 0.5 10*3/uL (ref 0.1–0.9)
MONO%: 8.8 % (ref 0.0–14.0)
NEUT#: 4.2 10*3/uL (ref 1.5–6.5)
NEUT%: 75.1 % (ref 38.4–76.8)
Platelets: 131 10*3/uL — ABNORMAL LOW (ref 145–400)
RBC: 3.71 10*6/uL (ref 3.70–5.45)
RDW: 17.2 % — AB (ref 11.2–14.5)
WBC: 5.6 10*3/uL (ref 3.9–10.3)

## 2016-10-23 LAB — BASIC METABOLIC PANEL
Anion Gap: 9 mEq/L (ref 3–11)
BUN: 9.3 mg/dL (ref 7.0–26.0)
CO2: 19 meq/L — AB (ref 22–29)
CREATININE: 1 mg/dL (ref 0.6–1.1)
Calcium: 9.2 mg/dL (ref 8.4–10.4)
Chloride: 115 mEq/L — ABNORMAL HIGH (ref 98–109)
EGFR: 64 mL/min/{1.73_m2} — AB (ref >90)
Glucose: 99 mg/dl (ref 70–140)
Potassium: 4.8 mEq/L (ref 3.5–5.1)
SODIUM: 143 meq/L (ref 136–145)

## 2016-10-23 MED ORDER — BORTEZOMIB CHEMO SQ INJECTION 3.5 MG (2.5MG/ML)
1.3000 mg/m2 | Freq: Once | INTRAMUSCULAR | Status: AC
  Administered 2016-10-23: 2.5 mg via SUBCUTANEOUS
  Filled 2016-10-23: qty 2.5

## 2016-10-23 MED ORDER — ONDANSETRON HCL 8 MG PO TABS: 8.0000 mg | ORAL_TABLET | Freq: Once | ORAL | Status: DC

## 2016-10-23 NOTE — Patient Instructions (Signed)
[facility] Discharge Instructions for Patients Receiving Chemotherapy  Today you received the following chemotherapy agents Velcade. To help prevent nausea and vomiting after your treatment, we encourage you to take your nausea medication as directed.  If you develop nausea and vomiting that is not controlled by your nausea medication, call the clinic.   BELOW ARE SYMPTOMS THAT SHOULD BE REPORTED IMMEDIATELY:  *FEVER GREATER THAN 100.5 F  *CHILLS WITH OR WITHOUT FEVER  NAUSEA AND VOMITING THAT IS NOT CONTROLLED WITH YOUR NAUSEA MEDICATION  *UNUSUAL SHORTNESS OF BREATH  *UNUSUAL BRUISING OR BLEEDING  TENDERNESS IN MOUTH AND THROAT WITH OR WITHOUT PRESENCE OF ULCERS  *URINARY PROBLEMS  *BOWEL PROBLEMS  UNUSUAL RASH Items with * indicate a potential emergency and should be followed up as soon as possible.  Feel free to call the clinic you have any questions or concerns. The clinic phone number is [phone].  Please show the CHEMO ALERT CARD at check-in to the Emergency Department and triage nurse.    

## 2016-10-26 ENCOUNTER — Other Ambulatory Visit: Payer: Self-pay | Admitting: Oncology

## 2016-10-27 ENCOUNTER — Encounter (HOSPITAL_COMMUNITY): Payer: Self-pay

## 2016-10-27 ENCOUNTER — Emergency Department (HOSPITAL_COMMUNITY): Payer: Medicare HMO

## 2016-10-27 ENCOUNTER — Emergency Department (HOSPITAL_COMMUNITY)
Admission: EM | Admit: 2016-10-27 | Discharge: 2016-10-27 | Disposition: A | Discharge status: Home / Self Care | Payer: Medicare HMO | Attending: Emergency Medicine | Admitting: Emergency Medicine

## 2016-10-27 DIAGNOSIS — I252 Old myocardial infarction: Secondary | ICD-10-CM | POA: Diagnosis not present

## 2016-10-27 DIAGNOSIS — E876 Hypokalemia: Secondary | ICD-10-CM | POA: Diagnosis not present

## 2016-10-27 DIAGNOSIS — R001 Bradycardia, unspecified: Secondary | ICD-10-CM | POA: Diagnosis not present

## 2016-10-27 DIAGNOSIS — N2889 Other specified disorders of kidney and ureter: Secondary | ICD-10-CM | POA: Diagnosis not present

## 2016-10-27 DIAGNOSIS — Z955 Presence of coronary angioplasty implant and graft: Secondary | ICD-10-CM | POA: Insufficient documentation

## 2016-10-27 DIAGNOSIS — D696 Thrombocytopenia, unspecified: Secondary | ICD-10-CM | POA: Diagnosis not present

## 2016-10-27 DIAGNOSIS — Z7982 Long term (current) use of aspirin: Secondary | ICD-10-CM | POA: Insufficient documentation

## 2016-10-27 DIAGNOSIS — I10 Essential (primary) hypertension: Secondary | ICD-10-CM | POA: Diagnosis not present

## 2016-10-27 DIAGNOSIS — R112 Nausea with vomiting, unspecified: Secondary | ICD-10-CM | POA: Insufficient documentation

## 2016-10-27 DIAGNOSIS — I2510 Atherosclerotic heart disease of native coronary artery without angina pectoris: Secondary | ICD-10-CM | POA: Insufficient documentation

## 2016-10-27 DIAGNOSIS — D72829 Elevated white blood cell count, unspecified: Secondary | ICD-10-CM | POA: Diagnosis not present

## 2016-10-27 DIAGNOSIS — F17210 Nicotine dependence, cigarettes, uncomplicated: Secondary | ICD-10-CM | POA: Diagnosis not present

## 2016-10-27 LAB — URINALYSIS, ROUTINE W REFLEX MICROSCOPIC
BACTERIA UA: NONE SEEN
Bilirubin Urine: NEGATIVE
Glucose, UA: 50 mg/dL — AB
Hgb urine dipstick: NEGATIVE
Ketones, ur: 5 mg/dL — AB
Leukocytes, UA: NEGATIVE
Nitrite: NEGATIVE
SPECIFIC GRAVITY, URINE: 1.011 (ref 1.005–1.030)
pH: 6 (ref 5.0–8.0)

## 2016-10-27 LAB — CBC WITH DIFFERENTIAL/PLATELET
BASOS PCT: 0 %
Basophils Absolute: 0 10*3/uL (ref 0.0–0.1)
EOS ABS: 0 10*3/uL (ref 0.0–0.7)
EOS PCT: 0 %
HEMATOCRIT: 41.4 % (ref 36.0–46.0)
HEMOGLOBIN: 13.2 g/dL (ref 12.0–15.0)
LYMPHS PCT: 14 %
Lymphs Abs: 2 10*3/uL (ref 0.7–4.0)
MCH: 29.7 pg (ref 26.0–34.0)
MCHC: 31.9 g/dL (ref 30.0–36.0)
MCV: 93.2 fL (ref 78.0–100.0)
MONOS PCT: 8 %
Monocytes Absolute: 1.1 10*3/uL — ABNORMAL HIGH (ref 0.1–1.0)
NEUTROS ABS: 11 10*3/uL — AB (ref 1.7–7.7)
Neutrophils Relative %: 78 %
Platelets: 81 10*3/uL — ABNORMAL LOW (ref 150–400)
RBC: 4.44 MIL/uL (ref 3.87–5.11)
RDW: 15.8 % — ABNORMAL HIGH (ref 11.5–15.5)
WBC: 14.1 10*3/uL — ABNORMAL HIGH (ref 4.0–10.5)

## 2016-10-27 LAB — COMPREHENSIVE METABOLIC PANEL
ALK PHOS: 89 U/L (ref 38–126)
ALT: 12 U/L — AB (ref 14–54)
AST: 17 U/L (ref 15–41)
Albumin: 4.6 g/dL (ref 3.5–5.0)
Anion gap: 14 (ref 5–15)
BUN: 9 mg/dL (ref 6–20)
CO2: 23 mmol/L (ref 22–32)
Calcium: 9.1 mg/dL (ref 8.9–10.3)
Chloride: 107 mmol/L (ref 101–111)
Creatinine, Ser: 1 mg/dL (ref 0.44–1.00)
GFR, EST NON AFRICAN AMERICAN: 56 mL/min — AB (ref >60)
GLUCOSE: 118 mg/dL — AB (ref 65–99)
Potassium: 3.1 mmol/L — ABNORMAL LOW (ref 3.5–5.1)
SODIUM: 144 mmol/L (ref 135–145)
TOTAL PROTEIN: 7.6 g/dL (ref 6.5–8.1)
Total Bilirubin: 0.5 mg/dL (ref 0.3–1.2)

## 2016-10-27 LAB — MAGNESIUM: MAGNESIUM: 1.7 mg/dL (ref 1.7–2.4)

## 2016-10-27 LAB — I-STAT CG4 LACTIC ACID, ED: Lactic Acid, Venous: 1.74 mmol/L (ref 0.5–1.9)

## 2016-10-27 LAB — LIPASE, BLOOD: LIPASE: 40 U/L (ref 11–51)

## 2016-10-27 MED ORDER — POTASSIUM CHLORIDE CRYS ER 20 MEQ PO TBCR
40.0000 meq | EXTENDED_RELEASE_TABLET | Freq: Once | ORAL | Status: AC
  Administered 2016-10-27: 40 meq via ORAL
  Filled 2016-10-27: qty 2

## 2016-10-27 MED ORDER — MAGNESIUM SULFATE 2 GM/50ML IV SOLN
2.0000 g | Freq: Once | INTRAVENOUS | Status: AC
  Administered 2016-10-27: 2 g via INTRAVENOUS
  Filled 2016-10-27: qty 50

## 2016-10-27 MED ORDER — SODIUM CHLORIDE 0.9 % IV BOLUS (SEPSIS)
500.0000 mL | Freq: Once | INTRAVENOUS | Status: AC
  Administered 2016-10-27: 500 mL via INTRAVENOUS

## 2016-10-27 MED ORDER — ONDANSETRON HCL 4 MG/2ML IJ SOLN
4.0000 mg | Freq: Once | INTRAMUSCULAR | Status: AC
  Administered 2016-10-27: 4 mg via INTRAVENOUS
  Filled 2016-10-27: qty 2

## 2016-10-27 MED ORDER — LABETALOL HCL 5 MG/ML IV SOLN
5.0000 mg | Freq: Once | INTRAVENOUS | Status: AC
  Administered 2016-10-27: 5 mg via INTRAVENOUS
  Filled 2016-10-27: qty 4

## 2016-10-27 MED ORDER — SODIUM CHLORIDE 0.9 % IV BOLUS (SEPSIS)
500.0000 mL | Freq: Once | INTRAVENOUS | Status: AC
Start: 2016-10-27 — End: 2016-10-27
  Administered 2016-10-27: 500 mL via INTRAVENOUS

## 2016-10-27 MED ORDER — ONDANSETRON HCL 4 MG PO TABS: 4.0000 mg | ORAL_TABLET | Freq: Three times a day (TID) | ORAL | 0 refills | Status: DC | PRN

## 2016-10-27 MED ORDER — PROMETHAZINE HCL 25 MG RE SUPP: 25.0000 mg | Freq: Four times a day (QID) | RECTAL | 0 refills | Status: DC | PRN

## 2016-10-27 MED ORDER — FAMOTIDINE IN NACL 20-0.9 MG/50ML-% IV SOLN
20.0000 mg | Freq: Once | INTRAVENOUS | Status: AC
  Administered 2016-10-27: 20 mg via INTRAVENOUS
  Filled 2016-10-27: qty 50

## 2016-10-27 MED ORDER — IOPAMIDOL (ISOVUE-300) INJECTION 61%
INTRAVENOUS | Status: AC
  Administered 2016-10-27: 100 mL
  Filled 2016-10-27: qty 100

## 2016-10-27 NOTE — ED Notes (Signed)
Pt given water for fluid challenge 

## 2016-10-27 NOTE — ED Notes (Signed)
Attempted US guided IV x2 with no success.

## 2016-10-27 NOTE — ED Notes (Signed)
Hooked patient back up to the monitor after xray 

## 2016-10-27 NOTE — ED Notes (Signed)
Abigail [name], [place] notified that IV was just placed and magnesium will be started after pepcid and the oral challenge will be completed once pt is no longer nauseous.

## 2016-10-27 NOTE — ED Provider Notes (Signed)
[place] DEPT Provider Note   CSN: [id number] Arrival date & time: 10/27/16  0536     History   Chief Complaint Chief Complaint  Patient presents with  . Emesis    HPI   Blood pressure 160/82, pulse 65, temperature 98.7 F (37.1 C), temperature source Oral, resp. rate 16, height 5' 5.5" (1.664 m), SpO2 100 %.  Abigail [name] is a 71 y.o. female complaining of Multiple episodes of non-bloody, nonbilious, non-coffee ground emesis onset yesterday with no fever, chills, change in bowel or bladder habits. She has no significant abdominal pain but has some mild epigastric discomfort. She denies chest pain or shortness of breath. She has chronic constipation issues and she has not had a bowel movement in 3 days, states this is not atypical for her. She is passing flatus normally. She does not have a history of abdominal surgeries: She is status post hysterectomy and appendectomy in the remote past. She denies sick contacts.  Past Medical History:  Diagnosis Date  . Anemia 07/2015  . Bilateral renal artery stenosis ([place]) 1999   s/p stenting  . Coronary artery disease 1999  . Depression   . Diverticulosis   . Duodenitis   . Gastric erosions   . Gastric ulcer   . GERD (gastroesophageal reflux disease)   . Heart murmur   . History of blood transfusion ~ 03/2016   "low HgB; practically nonexistent"  . Hyperlipidemia   . Hypertension   . MI (myocardial infarction) 1999  . Retinal hemorrhage, right eye   . Schatzki's ring   . Tubular adenoma of colon     Patient Active Problem List   Diagnosis Date Noted  . Multiple myeloma ([place]) 06/18/2016  . Malnutrition of moderate degree 06/14/2016  . SOB (shortness of breath) 06/13/2016  . 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 Shortness of breath at rest 01/11/2016  . Exertional dyspnea 11/01/2015  . Coronary atherosclerosis of native coronary artery 11/01/2015  . Shortness of breath 11/01/2015  . Chest  pain 12/31/2011    Past Surgical History:  Procedure Laterality Date  . APPENDECTOMY    . BACK SURGERY    . CATARACT EXTRACTION W/ INTRAOCULAR LENS IMPLANT Right 2014  . CORONARY ANGIOPLASTY WITH STENT PLACEMENT  1999  . ESOPHAGOGASTRODUODENOSCOPY N/A 11/03/2015   Procedure: ESOPHAGOGASTRODUODENOSCOPY (EGD);  Surgeon: Carol Ada, MD;  Location: [facility] ENDOSCOPY;  Service: Endoscopy;  Laterality: N/A;  . LUMBAR [place]    . RENAL ARTERY STENT  1999   bil RAS so ? bil vs unilateral stents  . RETINAL LASER PROCEDURE Right 2012   "bleeding"  . TONSILLECTOMY    . TOTAL ABDOMINAL HYSTERECTOMY      OB History    No data available       Home Medications    Prior to Admission medications   Medication Sig Start Date End Date Taking? Authorizing Provider  acyclovir (ZOVIRAX) 400 MG tablet TAKE 1 TABLET (400 MG TOTAL) BY MOUTH 2 (TWO) TIMES DAILY. 08/29/16  Yes Ladell Pier, MD  amLODipine (NORVASC) 10 MG tablet Take 5 mg by mouth at bedtime.  01/03/16  Yes Historical Provider, MD  Ascorbic Acid (VITAMIN C) 1000 MG tablet Take 1,000 mg by mouth daily.    Yes Historical Provider, MD  aspirin EC 81 MG tablet Take 81 mg by mouth daily.   Yes Historical Provider, MD  calcium carbonate (OS-CAL - DOSED IN MG OF ELEMENTAL CALCIUM) 1250 (500 Ca) MG  tablet Take 1 tablet (500 mg of elemental calcium total) by mouth daily with breakfast. 06/19/16  Yes [name], MD  Cholecalciferol (VITAMIN D PO) Take 1 capsule by mouth daily.   Yes Historical Provider, MD  dexamethasone (DECADRON) 4 MG tablet Decadron 40 mg once a week on day of Velcade injection Patient taking differently: Take 20 mg by mouth once a week. Decadron 40 mg once a week on day of Velcade injection  07/24/16  Yes Owens Shark, NP  docusate sodium (COLACE) 100 MG capsule TAKE 1 CAPSULE (100 MG TOTAL) BY MOUTH DAILY. 07/02/16  Yes Historical Provider, MD  FLUoxetine (PROZAC) 10 MG capsule Take 10 mg by mouth at bedtime.    Yes  Historical Provider, MD  isosorbide mononitrate (IMDUR) 60 MG 24 hr tablet Take 1 tablet (60 mg total) by mouth daily. 06/19/16  Yes [name], MD  KLOR-CON M10 10 MEQ tablet Take 10 mEq by mouth daily.  06/13/16  Yes Historical Provider, MD  lenalidomide (REVLIMID) 15 MG capsule Take 1 capsule (15 mg total) by mouth daily. For 14 days, then 14 days off. 10/03/16  Yes Ladell Pier, MD  Multiple Vitamins-Minerals (MULTIVITAMIN ADULT PO) Take 1 tablet by mouth daily. Reported on 10/19/2015   Yes Historical Provider, MD  pantoprazole (PROTONIX) 40 MG tablet Take 1 tablet (40 mg total) by mouth daily. Reported on 10/19/2015 01/14/16  Yes [name], MD  pravastatin (PRAVACHOL) 40 MG tablet Take 40 mg by mouth every evening. 06/03/15  Yes Historical Provider, MD  ondansetron (ZOFRAN) 4 MG tablet Take 1 tablet (4 mg total) by mouth every 8 (eight) hours as needed for nausea or vomiting. 10/27/16   Tanajah Boulter, PA-C  promethazine (PHENERGAN) 25 MG suppository Place 1 suppository (25 mg total) rectally every 6 (six) hours as needed for nausea or vomiting. 10/27/16   Elmyra Ricks Dilyn Osoria, PA-C    Family History Family History  Problem Relation Age of Onset  . Colon cancer Neg Hx     Social History Social History  Substance Use Topics  . Smoking status: Current Every Day Smoker    Packs/day: 0.50    Years: 48.00    Types: Cigarettes  . Smokeless tobacco: Never Used  . Alcohol use No     Allergies   Sulfa antibiotics   Review of Systems Review of Systems  10 systems reviewed and found to be negative, except as noted in the HPI.  Physical Exam Updated Vital Signs BP 170/64   Pulse (!) 49   Temp 99.7 F (37.6 C) (Oral)   Resp 16   Ht 5' 5.5" (1.664 m)   SpO2 96%   Physical Exam  Constitutional: She is oriented to person, place, and time. She appears well-developed and well-nourished. No distress.  HENT:  Head: Normocephalic and atraumatic.  Mouth/Throat: Oropharynx is clear  and moist.  Eyes: Conjunctivae and EOM are normal. Pupils are equal, round, and reactive to light.  Neck: Normal range of motion.  Cardiovascular: Normal rate, regular rhythm and intact distal pulses.   Pulmonary/Chest: Effort normal and breath sounds normal. No stridor.  Abdominal: Soft. Bowel sounds are normal. She exhibits no distension and no mass. There is no tenderness. There is no rebound and no guarding. No hernia.  Genitourinary:  Genitourinary Comments: No CVA tenderness to percussion bilaterally  Musculoskeletal: Normal range of motion.  Neurological: She is alert and oriented to person, place, and time.  Skin: Capillary refill takes less than 2 seconds. She  is not diaphoretic.  Psychiatric: She has a normal mood and affect.  Nursing note and vitals reviewed.    ED Treatments / Results  Labs (all labs ordered are listed, but only abnormal results are displayed) Labs Reviewed  COMPREHENSIVE METABOLIC PANEL - Abnormal; Notable for the following:       Result Value   Potassium 3.1 (*)    Glucose, Bld 118 (*)    ALT 12 (*)    GFR calc non Af Amer 56 (*)    All other components within normal limits  CBC WITH DIFFERENTIAL/PLATELET - Abnormal; Notable for the following:    WBC 14.1 (*)    RDW 15.8 (*)    Platelets 81 (*)    Neutro Abs 11.0 (*)    Monocytes Absolute 1.1 (*)    All other components within normal limits  URINALYSIS, ROUTINE W REFLEX MICROSCOPIC - Abnormal; Notable for the following:    Glucose, UA 50 (*)    Ketones, ur 5 (*)    Protein, ur >=300 (*)    Squamous Epithelial / LPF 0-5 (*)    All other components within normal limits  LIPASE, BLOOD  MAGNESIUM  I-STAT CG4 LACTIC ACID, ED  I-STAT CG4 LACTIC ACID, ED    EKG  EKG Interpretation None       Radiology Dg Abdomen 1 View  Result Date: 10/27/2016 CLINICAL DATA:  71 year old female with a history of constipation and vomiting EXAM: ABDOMEN - 1 VIEW COMPARISON:  CT 07/24/2015, plain film  06/13/2016 FINDINGS: Gas within stomach, small bowel, colon.  No abnormal distention. Calcified density in the left lateral abdomen, of uncertain location. Pelvic phleboliths. Vascular stents of renal arteries again demonstrated. Multilevel degenerative changes of the lumbar spine. Vascular calcifications of the abdominal aorta. No radiopaque foreign body. No displaced fracture IMPRESSION: Nonobstructive bowel gas pattern. Small calcification within the left mid abdomen, favored to be within the fecal stream, although could represent kidney calcification/nephrolithiasis. Aortic atherosclerosis. Vascular stents of the renal arteries. Signed, Dulcy Fanny. Earleen Newport, DO [facility] Electronically Signed   By: Corrie Mckusick D.O.   On: 10/27/2016 09:12   Ct Abdomen Pelvis W Contrast  Result Date: 10/27/2016 CLINICAL DATA:  Vomiting since yesterday.  No pain. EXAM: CT ABDOMEN AND PELVIS WITH CONTRAST TECHNIQUE: Multidetector CT imaging of the abdomen and pelvis was performed using the standard protocol following bolus administration of intravenous contrast. CONTRAST:  125m ISOVUE-300 IOPAMIDOL (ISOVUE-300) INJECTION 61% COMPARISON:  07/24/2015 FINDINGS: Lower chest: No acute abnormality. Hepatobiliary: No focal liver abnormality is seen. No gallstones, gallbladder wall thickening, or biliary dilatation. Pancreas: Unremarkable. No pancreatic ductal dilatation or surrounding inflammatory changes. Spleen: Normal in size without focal abnormality. Adrenals/Urinary Tract: Adrenal glands are unremarkable. No urolithiasis or obstructive uropathy. 16.8 cm right inferior pole renal mass measuring 52 Hounsfield units which is new compared with 07/24/2015. Multiple hypodense, fluid attenuating left renal mass is most consistent with cysts with the largest measuring 2.3 cm in the mid pole. 8.3 mm indeterminate hypodense left anterior interpolar mass (image 36/series 2). Bladder  is unremarkable. Stomach/Bowel: Stomach is within normal limits. No evidence of bowel wall thickening, distention, or inflammatory changes. Vascular/Lymphatic: Abdominal aortic atherosclerosis. A normal caliber abdominal aorta. No lymphadenopathy. Reproductive: Status post hysterectomy. No adnexal masses. Other: No abdominal wall hernia or abnormality. No abdominopelvic ascites. Musculoskeletal: No lytic or sclerotic osseous lesion. No acute osseous abnormality. Mild osteoarthritis bilateral hips. Severe degenerative disc disease with disc height loss at  L5-S1. Degenerative disc disease with severe disc height loss at T12-L1 and L1-2. Minimal retrolisthesis of L1 on L2 and T12 on L1. Mild osteoarthritis of bilateral sacroiliac joints. IMPRESSION: 1. No acute abdominal or pelvic pathology. 2. 16.8 cm indeterminate right inferior pole renal mass new compared with 07/24/2015. Similar smaller 8.3 mm indeterminate left anterior interpolar renal mass. Recommend further evaluation with MRI or CT of the abdomen without and with intravenous contrast. Electronically Signed   By: Kathreen Devoid   On: 10/27/2016 14:21    Procedures Procedures (including critical care time)  Medications Ordered in ED Medications  ondansetron (ZOFRAN) injection 4 mg (4 mg Intravenous Given 10/27/16 0941)  sodium chloride 0.9 % bolus 500 mL (0 mLs Intravenous Stopped 10/27/16 1420)  sodium chloride 0.9 % bolus 500 mL (0 mLs Intravenous Stopped 10/27/16 1420)  famotidine (PEPCID) IVPB 20 mg premix (0 mg Intravenous Stopped 10/27/16 1009)  labetalol (NORMODYNE,TRANDATE) injection 5 mg (5 mg Intravenous Given 10/27/16 0940)  magnesium sulfate IVPB 2 g 50 mL (0 g Intravenous Stopped 10/27/16 1116)  iopamidol (ISOVUE-300) 61 % injection (100 mLs  Contrast Given 10/27/16 1052)  potassium chloride SA (K-DUR,KLOR-CON) CR tablet 40 mEq (40 mEq Oral Given 10/27/16 1409)     Initial Impression / Assessment and Plan / ED Course  I have reviewed the triage  vital signs and the nursing notes.  Pertinent labs & imaging results that were available during my care of the patient were reviewed by me and considered in my medical decision making (see chart for details).  Clinical Course     Vitals:   10/27/16 1445 10/27/16 1500 10/27/16 1515 10/27/16 1530  BP: 173/62 174/62 177/71 170/64  Pulse: (!) 40 (!) 51 (!) 50 (!) 49  Resp: _0 Temp:      TempSrc:      SpO2: 98% 97% 97% 96%  Height:        Medications  ondansetron (ZOFRAN) injection 4 mg (4 mg Intravenous Given 10/27/16 0941)  sodium chloride 0.9 % bolus 500 mL (0 mLs Intravenous Stopped 10/27/16 1420)  sodium chloride 0.9 % bolus 500 mL (0 mLs Intravenous Stopped 10/27/16 1420)  famotidine (PEPCID) IVPB 20 mg premix (0 mg Intravenous Stopped 10/27/16 1009)  labetalol (NORMODYNE,TRANDATE) injection 5 mg (5 mg Intravenous Given 10/27/16 0940)  magnesium sulfate IVPB 2 g 50 mL (0 g Intravenous Stopped 10/27/16 1116)  iopamidol (ISOVUE-300) 61 % injection (100 mLs  Contrast Given 10/27/16 1052)  potassium chloride SA (K-DUR,KLOR-CON) CR tablet 40 mEq (40 mEq Oral Given 10/27/16 1409)    Abigail [name] is 71 y.o. female presenting with Nausea and vomiting onset yesterday. Abdominal exam is benign, she denies any significant discomfort, melena, hematochezia, is not anticoagulated. Blood work with a mild leukocytosis of 14.1, low platelets at 81. She also has a left shift. Acute abdominal series negative however given the leukocytosis and think is reasonable to obtain CT.  This is a shared visit with the attending physician who personally evaluated the patient and agrees with the care plan.   Repeat abdominal exam remains benign. Reports improvement in her nausea, she is tolerating by mouth's. CT abdomen pelvis with no acute pathology however they do note a 17 cm right inferior pole renal mass which is, they see a similar smaller left renal mass. Recommend further evaluation with MRI or noncontrast CT is  an outpatient. I've explained to this patient that it is critically important that she have these further evaluated  by her primary care physician patient verbalized understanding and teach back technique.  Ports that she would like to have the mass further evaluated today, discussed with attending who recommends CT in the ED. Discussed with CT tech who clarifies that the radiologist had recommended a renal CT which is actually with and without contrast and as this patient has had a dye load earlier in the day she cannot get a second one. I've explained this to her and she understands, she will follow with Dr. Malachy Mood this Thursday for chemotherapy, I have asked her to have Dr. Malachy Mood arrange an MRI or CT as an outpatient, we've had an extensive discussion of return precautions and patient verbalized understanding. Repeat abdominal exam remains benign. Patient states she has Zofran at home but she only takes it before chemotherapy, advised her she can take this every 6 hours at home and will also write a prescription for Phenergan suppositories.  Evaluation does not show pathology that would require ongoing emergent intervention or inpatient treatment. Pt is hemodynamically stable and mentating appropriately. Discussed findings and plan with patient/guardian, who agrees with care plan. All questions answered. Return precautions discussed and outpatient follow up given.     Final Clinical Impressions(s) / ED Diagnoses   Final diagnoses:  Nausea and vomiting, intractability of vomiting not specified, unspecified vomiting type  Thrombocytopenia (HCC)  Leukocytosis, unspecified type  Hypokalemia  Bradycardia  Bilateral renal masses    New Prescriptions New Prescriptions   ONDANSETRON (ZOFRAN) 4 MG TABLET    Take 1 tablet (4 mg total) by mouth every 8 (eight) hours as needed for nausea or vomiting.   PROMETHAZINE (PHENERGAN) 25 MG SUPPOSITORY    Place 1 suppository (25 mg total) rectally every 6 (six)  hours as needed for nausea or vomiting.     Monico Blitz, PA-C 10/27/16 [place], MD 10/28/16 2031

## 2016-10-27 NOTE — ED Notes (Signed)
Pt is in stable condition upon d/c and is escorted from ED via wheelchair. 

## 2016-10-27 NOTE — Discharge Instructions (Signed)
Your CAT scan today shows a mass both on your right kidney in your left kidney, it is very important that you follow with your primary care or Dr Benay Spice at your appointment this Thursday for further evaluation. Please let them know you were seen at the emergency department, they need to obtain records.  You can take Zofran every 6 hours at home as needed for nausea. You can use the Phenergan suppositories if this does not work.  Do not hesitate to return to the emergency department for any new, worsening or concerning symptoms.

## 2016-10-27 NOTE — ED Notes (Signed)
Patient transported to CT 

## 2016-10-27 NOTE — ED Notes (Signed)
CT called and states IV infiltrated. Will attempt US guided IV once patient arrives back to room.

## 2016-10-27 NOTE — ED Notes (Signed)
Placed patient back up to the monitor after ct patient is resting

## 2016-10-27 NOTE — ED Triage Notes (Signed)
Pt states she started n/v yesterday and it started to turn brown; pt has hx of muti myeloma; Pt is currently taking Chemo; Pt a&ox 4 on arrival. Pt denies pain on arrival.

## 2016-10-28 ENCOUNTER — Encounter (HOSPITAL_COMMUNITY): Payer: Self-pay

## 2016-10-28 ENCOUNTER — Emergency Department (HOSPITAL_COMMUNITY)
Admission: EM | Admit: 2016-10-28 | Discharge: 2016-10-29 | Disposition: A | Discharge status: Home / Self Care | Payer: Medicare HMO | Attending: Emergency Medicine | Admitting: Emergency Medicine

## 2016-10-28 DIAGNOSIS — Z7982 Long term (current) use of aspirin: Secondary | ICD-10-CM | POA: Insufficient documentation

## 2016-10-28 DIAGNOSIS — R339 Retention of urine, unspecified: Secondary | ICD-10-CM | POA: Diagnosis present

## 2016-10-28 DIAGNOSIS — I252 Old myocardial infarction: Secondary | ICD-10-CM | POA: Diagnosis not present

## 2016-10-28 DIAGNOSIS — F17210 Nicotine dependence, cigarettes, uncomplicated: Secondary | ICD-10-CM | POA: Insufficient documentation

## 2016-10-28 DIAGNOSIS — I2510 Atherosclerotic heart disease of native coronary artery without angina pectoris: Secondary | ICD-10-CM | POA: Insufficient documentation

## 2016-10-28 DIAGNOSIS — N289 Disorder of kidney and ureter, unspecified: Secondary | ICD-10-CM | POA: Diagnosis not present

## 2016-10-28 HISTORY — DX: Multiple myeloma not having achieved remission: C90.00

## 2016-10-28 LAB — BASIC METABOLIC PANEL
Anion gap: 10 (ref 5–15)
BUN: 21 mg/dL — AB (ref 6–20)
CO2: 22 mmol/L (ref 22–32)
Calcium: 8.8 mg/dL — ABNORMAL LOW (ref 8.9–10.3)
Chloride: 107 mmol/L (ref 101–111)
Creatinine, Ser: 1.56 mg/dL — ABNORMAL HIGH (ref 0.44–1.00)
GFR calc Af Amer: 38 mL/min — ABNORMAL LOW (ref >60)
GFR, EST NON AFRICAN AMERICAN: 33 mL/min — AB (ref >60)
GLUCOSE: 115 mg/dL — AB (ref 65–99)
Potassium: 3.1 mmol/L — ABNORMAL LOW (ref 3.5–5.1)
Sodium: 139 mmol/L (ref 135–145)

## 2016-10-28 LAB — CBC
HEMATOCRIT: 35.9 % — AB (ref 36.0–46.0)
Hemoglobin: 11.9 g/dL — ABNORMAL LOW (ref 12.0–15.0)
MCH: 30.4 pg (ref 26.0–34.0)
MCHC: 33.1 g/dL (ref 30.0–36.0)
MCV: 91.6 fL (ref 78.0–100.0)
Platelets: 110 10*3/uL — ABNORMAL LOW (ref 150–400)
RBC: 3.92 MIL/uL (ref 3.87–5.11)
RDW: 15.9 % — AB (ref 11.5–15.5)
WBC: 8.1 10*3/uL (ref 4.0–10.5)

## 2016-10-28 MED ORDER — SODIUM CHLORIDE 0.9 % IV BOLUS (SEPSIS)
1000.0000 mL | Freq: Once | INTRAVENOUS | Status: AC
  Administered 2016-10-28: 1000 mL via INTRAVENOUS

## 2016-10-28 NOTE — ED Notes (Signed)
Provider performed bladder scan, small amount of urine noted, exact amount not measured.

## 2016-10-28 NOTE — ED Provider Notes (Signed)
[place] DEPT Provider Note   CSN: [id number] Arrival date & time: 10/28/16  2123     History   Chief Complaint Chief Complaint  Patient presents with  . Urinary Retention    HPI Abigail [name] is a 71 y.o. female.  HPI Patient presents feeling as if she has to urinate. Has not urinated since this morning. States she's been drinking all day. Seen in the ER, yesterday for nausea and vomiting. Had CT scan done at that time showed renal mass. Was going to have outpatient follow-up. Has a history of multiple myeloma and is on chemotherapy. States the nausea is improved. Has dull lower abdominal pain. No fevers. Pain is dull and constant.   Past Medical History:  Diagnosis Date  . Anemia 07/2015  . Bilateral renal artery stenosis ([place]) 1999   s/p stenting  . Coronary artery disease 1999  . Depression   . Diverticulosis   . Duodenitis   . Gastric erosions   . Gastric ulcer   . GERD (gastroesophageal reflux disease)   . Heart murmur   . History of blood transfusion ~ 03/2016   "low HgB; practically nonexistent"  . Hyperlipidemia   . Hypertension   . MI (myocardial infarction) 1999  . Multiple myeloma ([place])   . Retinal hemorrhage, right eye   . Schatzki's ring   . Tubular adenoma of colon     Patient Active Problem List   Diagnosis Date Noted  . Multiple myeloma ([place]) 06/18/2016  . Malnutrition of moderate degree 06/14/2016  . SOB (shortness of breath) 06/13/2016  . 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 Shortness of breath at rest 01/11/2016  . Exertional dyspnea 11/01/2015  . Coronary atherosclerosis of native coronary artery 11/01/2015  . Shortness of breath 11/01/2015  . Chest pain 12/31/2011    Past Surgical History:  Procedure Laterality Date  . APPENDECTOMY    . BACK SURGERY    . CATARACT EXTRACTION W/ INTRAOCULAR LENS IMPLANT Right 2014  . CORONARY ANGIOPLASTY WITH STENT PLACEMENT  1999  . ESOPHAGOGASTRODUODENOSCOPY N/A  11/03/2015   Procedure: ESOPHAGOGASTRODUODENOSCOPY (EGD);  Surgeon: Carol Ada, MD;  Location: [facility] ENDOSCOPY;  Service: Endoscopy;  Laterality: N/A;  . LUMBAR [place]    . RENAL ARTERY STENT  1999   bil RAS so ? bil vs unilateral stents  . RETINAL LASER PROCEDURE Right 2012   "bleeding"  . TONSILLECTOMY    . TOTAL ABDOMINAL HYSTERECTOMY      OB History    No data available       Home Medications    Prior to Admission medications   Medication Sig Start Date End Date Taking? Authorizing Provider  acyclovir (ZOVIRAX) 400 MG tablet TAKE 1 TABLET (400 MG TOTAL) BY MOUTH 2 (TWO) TIMES DAILY. 08/29/16  Yes Ladell Pier, MD  amLODipine (NORVASC) 10 MG tablet Take 5 mg by mouth at bedtime.  01/03/16  Yes Historical Provider, MD  Ascorbic Acid (VITAMIN C) 1000 MG tablet Take 1,000 mg by mouth daily.    Yes Historical Provider, MD  aspirin EC 81 MG tablet Take 81 mg by mouth daily.   Yes Historical Provider, MD  calcium carbonate (OS-CAL - DOSED IN MG OF ELEMENTAL CALCIUM) 1250 (500 Ca) MG tablet Take 1 tablet (500 mg of elemental calcium total) by mouth daily with breakfast. 06/19/16  Yes [name], MD  Cholecalciferol (VITAMIN D PO) Take 1 capsule by mouth daily.   Yes Historical Provider, MD  FLUoxetine (PROZAC) 10 MG capsule Take 10 mg by mouth at bedtime.    Yes Historical Provider, MD  isosorbide mononitrate (IMDUR) 60 MG 24 hr tablet Take 1 tablet (60 mg total) by mouth daily. 06/19/16  Yes [name], MD  KLOR-CON M10 10 MEQ tablet Take 10 mEq by mouth daily.  06/13/16  Yes Historical Provider, MD  ondansetron (ZOFRAN) 4 MG tablet Take 1 tablet (4 mg total) by mouth every 8 (eight) hours as needed for nausea or vomiting. 10/27/16  Yes Nicole Pisciotta, PA-C  pantoprazole (PROTONIX) 40 MG tablet Take 1 tablet (40 mg total) by mouth daily. Reported on 10/19/2015 01/14/16  Yes [name], MD  pravastatin (PRAVACHOL) 40 MG tablet Take 40 mg by mouth every evening. 06/03/15  Yes  Historical Provider, MD  dexamethasone (DECADRON) 4 MG tablet Decadron 40 mg once a week on day of Velcade injection Patient taking differently: Take 20 mg by mouth once a week. Decadron 40 mg once a week on day of Velcade injection  07/24/16   Owens Shark, NP  lenalidomide (REVLIMID) 15 MG capsule Take 1 capsule (15 mg total) by mouth daily. For 14 days, then 14 days off. 10/03/16   Ladell Pier, MD  promethazine (PHENERGAN) 25 MG suppository Place 1 suppository (25 mg total) rectally every 6 (six) hours as needed for nausea or vomiting. 10/27/16   Elmyra Ricks Pisciotta, PA-C    Family History Family History  Problem Relation Age of Onset  . Colon cancer Neg Hx     Social History Social History  Substance Use Topics  . Smoking status: Current Every Day Smoker    Packs/day: 0.50    Years: 48.00    Types: Cigarettes  . Smokeless tobacco: Never Used  . Alcohol use No     Allergies   Sulfa antibiotics   Review of Systems Review of Systems  Constitutional: Positive for appetite change. Negative for fever.  Respiratory: Negative for shortness of breath.   Cardiovascular: Negative for chest pain.  Gastrointestinal: Positive for abdominal pain.  Genitourinary: Positive for decreased urine volume and difficulty urinating.  Musculoskeletal: Negative for back pain.  Skin: Negative for wound.  Neurological: Negative for headaches.  Psychiatric/Behavioral: Negative for confusion.     Physical Exam Updated Vital Signs BP 149/65   Pulse (!) 47   Temp 98.1 F (36.7 C) (Oral)   Resp 14   Ht 5' 5.5" (1.664 m)   Wt 169 lb (76.7 kg)   SpO2 97%   BMI 27.70 kg/m   Physical Exam  Constitutional: She appears well-developed.  HENT:  Head: Atraumatic.  Neck: Neck supple.  Cardiovascular: Normal rate.   Pulmonary/Chest: Effort normal.  Abdominal: There is tenderness.  Suprapubic to lower abdominal tenderness without rebound or guarding. No mass. Scar on the midline from previous  hysterectomy.  Musculoskeletal: Normal range of motion.  Neurological: She is alert.  Skin: Skin is warm.  Psychiatric: She has a normal mood and affect.     ED Treatments / Results  Labs (all labs ordered are listed, but only abnormal results are displayed) Labs Reviewed  BASIC METABOLIC PANEL - Abnormal; Notable for the following:       Result Value   Potassium 3.1 (*)    Glucose, Bld 115 (*)    BUN 21 (*)    Creatinine, Ser 1.56 (*)    Calcium 8.8 (*)    GFR calc non Af Amer 33 (*)    GFR calc Af Wyvonnia Lora  38 (*)    All other components within normal limits  CBC - Abnormal; Notable for the following:    Hemoglobin 11.9 (*)    HCT 35.9 (*)    RDW 15.9 (*)    Platelets 110 (*)    All other components within normal limits  URINALYSIS, ROUTINE W REFLEX MICROSCOPIC    EKG  EKG Interpretation  Date/Time:  Tuesday October 28 2016 21:46:00 EST Ventricular Rate:  60 PR Interval:    QRS Duration: 90 QT Interval:  486 QTC Calculation: 486 R Axis:   3 Text Interpretation:  Sinus rhythm Premature atrial complexes Minimal ST depression, lateral leads Borderline prolonged QT interval Confirmed by Alvino Chapel  MD, Ovid Curd [id number]) on 10/28/2016 10:36:50 PM       Radiology Dg Abdomen 1 View  Result Date: 10/27/2016 CLINICAL DATA:  71 year old female with a history of constipation and vomiting EXAM: ABDOMEN - 1 VIEW COMPARISON:  CT 07/24/2015, plain film 06/13/2016 FINDINGS: Gas within stomach, small bowel, colon.  No abnormal distention. Calcified density in the left lateral abdomen, of uncertain location. Pelvic phleboliths. Vascular stents of renal arteries again demonstrated. Multilevel degenerative changes of the lumbar spine. Vascular calcifications of the abdominal aorta. No radiopaque foreign body. No displaced fracture IMPRESSION: Nonobstructive bowel gas pattern. Small calcification within the left mid abdomen, favored to be within the fecal stream, although could represent kidney  calcification/nephrolithiasis. Aortic atherosclerosis. Vascular stents of the renal arteries. Signed, Dulcy Fanny. Earleen Newport, DO [facility] Radiology Electronically Signed   By: Corrie Mckusick D.O.   On: 10/27/2016 09:12   Ct Abdomen Pelvis W Contrast  Result Date: 10/27/2016 CLINICAL DATA:  Vomiting since yesterday.  No pain. EXAM: CT ABDOMEN AND PELVIS WITH CONTRAST TECHNIQUE: Multidetector CT imaging of the abdomen and pelvis was performed using the standard protocol following bolus administration of intravenous contrast. CONTRAST:  137m ISOVUE-300 IOPAMIDOL (ISOVUE-300) INJECTION 61% COMPARISON:  07/24/2015 FINDINGS: Lower chest: No acute abnormality. Hepatobiliary: No focal liver abnormality is seen. No gallstones, gallbladder wall thickening, or biliary dilatation. Pancreas: Unremarkable. No pancreatic ductal dilatation or surrounding inflammatory changes. Spleen: Normal in size without focal abnormality. Adrenals/Urinary Tract: Adrenal glands are unremarkable. No urolithiasis or obstructive uropathy. 16.8 cm right inferior pole renal mass measuring 52 Hounsfield units which is new compared with 07/24/2015. Multiple hypodense, fluid attenuating left renal mass is most consistent with cysts with the largest measuring 2.3 cm in the mid pole. 8.3 mm indeterminate hypodense left anterior interpolar mass (image 36/series 2). Bladder is unremarkable. Stomach/Bowel: Stomach is within normal limits. No evidence of bowel wall thickening, distention, or inflammatory changes. Vascular/Lymphatic: Abdominal aortic atherosclerosis. A normal caliber abdominal aorta. No lymphadenopathy. Reproductive: Status post hysterectomy. No adnexal masses. Other: No abdominal wall hernia or abnormality. No abdominopelvic ascites. Musculoskeletal: No lytic or sclerotic osseous lesion. No acute osseous abnormality. Mild osteoarthritis bilateral hips. Severe degenerative disc disease with disc  height loss at L5-S1. Degenerative disc disease with severe disc height loss at T12-L1 and L1-2. Minimal retrolisthesis of L1 on L2 and T12 on L1. Mild osteoarthritis of bilateral sacroiliac joints. IMPRESSION: 1. No acute abdominal or pelvic pathology. 2. 16.8 cm indeterminate right inferior pole renal mass new compared with 07/24/2015. Similar smaller 8.3 mm indeterminate left anterior interpolar renal mass. Recommend further evaluation with MRI or CT of the abdomen without and with intravenous contrast. Electronically Signed   By: HKathreen Devoid  On: 10/27/2016 14:21    Procedures Procedures (including critical care  time)  Medications Ordered in ED Medications  sodium chloride 0.9 % bolus 1,000 mL (1,000 mLs Intravenous New Bag/Given 10/28/16 2221)     Initial Impression / Assessment and Plan / ED Course  I have reviewed the triage vital signs and the nursing notes.  Pertinent labs & imaging results that were available during my care of the patient were reviewed by me and considered in my medical decision making (see chart for details).  Clinical Course     Patient presented with feeling she had to urinate. Had nausea and vomiting yesterday. Bedside ultrasound showed some air in the in the bladder but not urinary retention. Now sleeping comfortably. Creatinine is increased 1.5 from a baseline of 1 yesterday. Urine pending. Patient feels better tolerates oral she likely able to discharge after urination. If not making require admission due to her worsening renal function.  Final Clinical Impressions(s) / ED Diagnoses   Final diagnoses:  Renal insufficiency    New Prescriptions New Prescriptions   No medications on file     Davonna Belling, MD 10/28/16 2324

## 2016-10-28 NOTE — ED Triage Notes (Signed)
PT C/O URINARY RETENTION. PT STS SHE HAS NOT URINATED SINCE THIS MORNING, DESPITE HER EATING AND DRINKING ALL DAY. PT HAS A HX OF MULTIPLE MYELOMA, AND IS GETTING CHEMO TX. PT'S BP 77/63 IN TRIAGE.

## 2016-10-29 LAB — URINALYSIS, ROUTINE W REFLEX MICROSCOPIC
BILIRUBIN URINE: NEGATIVE
GLUCOSE, UA: NEGATIVE mg/dL
HGB URINE DIPSTICK: NEGATIVE
Ketones, ur: NEGATIVE mg/dL
LEUKOCYTES UA: NEGATIVE
NITRITE: NEGATIVE
Protein, ur: 100 mg/dL — AB
SPECIFIC GRAVITY, URINE: 1.015 (ref 1.005–1.030)
pH: 5 (ref 5.0–8.0)

## 2016-10-29 MED ORDER — TRAMADOL HCL 50 MG PO TABS
50.0000 mg | ORAL_TABLET | Freq: Once | ORAL | Status: AC
  Administered 2016-10-29: 50 mg via ORAL
  Filled 2016-10-29: qty 1

## 2016-10-29 NOTE — ED Provider Notes (Signed)
Patient signed out to me pending UA for complete evaluation of abdominal pain and vomiting.  Cat performed and 300cc were removed.  UA neg for infection.  Cr is slightly up but has been that high in the past.  She was given IVF bolus and advised to hydrate well at home.  She has fu in 3 days with her oncologist. She appears well currently and in NAD.  VS remain within her normal limits and she is safe for DC.   Everlene Balls, MD 10/29/16 [phone]

## 2016-10-30 ENCOUNTER — Ambulatory Visit: Payer: Medicare HMO

## 2016-10-30 ENCOUNTER — Telehealth: Payer: Self-pay | Admitting: Oncology

## 2016-10-30 ENCOUNTER — Ambulatory Visit (HOSPITAL_BASED_OUTPATIENT_CLINIC_OR_DEPARTMENT_OTHER): Payer: Medicare HMO | Admitting: Nurse Practitioner

## 2016-10-30 ENCOUNTER — Other Ambulatory Visit (HOSPITAL_BASED_OUTPATIENT_CLINIC_OR_DEPARTMENT_OTHER): Payer: Medicare HMO

## 2016-10-30 VITALS — BP 132/70 | HR 65 | Temp 98.4°F | Resp 17 | Wt 162.2 lb

## 2016-10-30 DIAGNOSIS — R339 Retention of urine, unspecified: Secondary | ICD-10-CM

## 2016-10-30 DIAGNOSIS — D630 Anemia in neoplastic disease: Secondary | ICD-10-CM | POA: Diagnosis not present

## 2016-10-30 DIAGNOSIS — C9000 Multiple myeloma not having achieved remission: Secondary | ICD-10-CM

## 2016-10-30 LAB — COMPREHENSIVE METABOLIC PANEL
ALT: 8 U/L (ref 0–55)
ANION GAP: 8 meq/L (ref 3–11)
AST: 11 U/L (ref 5–34)
Albumin: 3.7 g/dL (ref 3.5–5.0)
Alkaline Phosphatase: 96 U/L (ref 40–150)
BILIRUBIN TOTAL: 0.54 mg/dL (ref 0.20–1.20)
BUN: 22.9 mg/dL (ref 7.0–26.0)
CHLORIDE: 111 meq/L — AB (ref 98–109)
CO2: 22 meq/L (ref 22–29)
Calcium: 9.5 mg/dL (ref 8.4–10.4)
Creatinine: 1.2 mg/dL — ABNORMAL HIGH (ref 0.6–1.1)
EGFR: 52 mL/min/{1.73_m2} — AB (ref >90)
GLUCOSE: 99 mg/dL (ref 70–140)
Potassium: 4.1 mEq/L (ref 3.5–5.1)
SODIUM: 140 meq/L (ref 136–145)
TOTAL PROTEIN: 6.3 g/dL — AB (ref 6.4–8.3)

## 2016-10-30 LAB — URINALYSIS, MICROSCOPIC - CHCC
BILIRUBIN (URINE): NEGATIVE
BLOOD: NEGATIVE
Glucose: NEGATIVE mg/dL
KETONES: NEGATIVE mg/dL
Nitrite: NEGATIVE
Protein: 30 mg/dL
RBC / HPF: NEGATIVE (ref 0–2)
SPECIFIC GRAVITY, URINE: 1.01 (ref 1.003–1.035)
UROBILINOGEN UR: 0.2 mg/dL (ref 0.2–1)
pH: 5 (ref 4.6–8.0)

## 2016-10-30 LAB — CBC WITH DIFFERENTIAL/PLATELET
BASO%: 0.2 % (ref 0.0–2.0)
BASOS ABS: 0 10*3/uL (ref 0.0–0.1)
EOS ABS: 0 10*3/uL (ref 0.0–0.5)
EOS%: 0.3 % (ref 0.0–7.0)
HEMATOCRIT: 35 % (ref 34.8–46.6)
HEMOGLOBIN: 11.4 g/dL — AB (ref 11.6–15.9)
LYMPH#: 0.7 10*3/uL — AB (ref 0.9–3.3)
LYMPH%: 11.2 % — ABNORMAL LOW (ref 14.0–49.7)
MCH: 30.2 pg (ref 25.1–34.0)
MCHC: 32.6 g/dL (ref 31.5–36.0)
MCV: 92.8 fL (ref 79.5–101.0)
MONO#: 0.7 10*3/uL (ref 0.1–0.9)
MONO%: 11 % (ref 0.0–14.0)
NEUT#: 4.8 10*3/uL (ref 1.5–6.5)
NEUT%: 77.3 % — ABNORMAL HIGH (ref 38.4–76.8)
PLATELETS: 162 10*3/uL (ref 145–400)
RBC: 3.77 10*6/uL (ref 3.70–5.45)
RDW: 16.1 % — ABNORMAL HIGH (ref 11.2–14.5)
WBC: 6.3 10*3/uL (ref 3.9–10.3)

## 2016-10-30 NOTE — Telephone Encounter (Signed)
Gave patient avs report and appointments for January and February  °

## 2016-10-30 NOTE — Progress Notes (Addendum)
[place] OFFICE PROGRESS NOTE   Diagnosis:  Multiple myeloma  INTERVAL HISTORY:   Abigail [name] returns as scheduled. She continues RVD. She was seen in the emergency department on 10/27/2016 with nausea/vomiting. CT abdomen/pelvis showed a new 16.8 cm right kidney mass. There was a similar smaller 8.3 mm left anterior renal mass. She was seen in the emergency department again on 10/28/2016 with urinary retention. She was catheterized with 300 mL of urine removed. Urinalysis was negative for signs of infection. Creatinine noted to be increased from 1 on 10/27/2016 to 1.5 on 10/28/2016. She received IV fluids. She was discharged home.  The nausea is much better. She now has very occasional nausea. No diarrhea. She continues to have difficulty urinating. She estimates urinating a normal volume once a day. She feels she needs to urinate more often but is unable to initiate a urine stream. She denies dysuria. She feels a "pressure" sensation at the left pelvis. No fever. No back pain. She has stable numbness in the feet.  She was able to pass a small amount of urine while in the office today. She reports good fluid intake.  Objective:  Vital signs in last 24 hours:  Blood pressure 132/70, pulse 65, temperature 98.4 F (36.9 C), temperature source Oral, resp. rate 17, weight 162 lb 3.2 oz (73.6 kg), SpO2 100 %.    HEENT: No thrush. Resp: Lungs clear bilaterally. Cardio: Regular rate and rhythm. GI: Abdomen is soft and nontender. Slight fullness at the left lower abdomen. No organomegaly. Vascular: No leg edema.    Lab Results:  Lab Results  Component Value Date   WBC 6.3 10/30/2016   HGB 11.4 (L) 10/30/2016   HCT 35.0 10/30/2016   MCV 92.8 10/30/2016   PLT 162 10/30/2016   NEUTROABS 4.8 10/30/2016    Imaging:  No results found.  Medications: I have reviewed the patient's current medications.  Assessment/Plan: 1. Multiple myeloma, IgG lambda, bone marrow biopsy  06/15/2016 confirmed multiple myeloma; cytogenetics by FISHshow +11, +12, 13q-  Elevated serum free lambda light chains  Lambda light chain proteinuria  Lytic bone lesions on a bone survey 06/13/2016  Initiation of weekly Velcade/Decadron 06/19/2016  Serum light chains improved 07/31/2016  Initiation of Revlimid 11/03/20172 weeks on/2 weeks off  Serum M spike and IgG significantly improved 09/25/2016  Velcade held on 10/30/2016 due to urinary retention  2. Severe anemia secondary to #1-markedly improved  3. Diffuse lytic bone lesions secondary to multiple myeloma, status post Zometa 09/18/2016 (plan to continue every 3 months)  4. History of coronary artery disease/myocardial infarction  5. History of colon polyps  6. Bilateral leg edema 09/04/2016. Negative bilateral venous Doppler 09/05/2016.  7. Mild periorbital edema 09/18/2016, question related to early stye formation, question Velcade related chalazia. Improved.  8. CT scan 10/27/2016 with a new right kidney mass-referred to urology.  9. Urinary retention 10/28/2016 status post evaluation in the emergency department. Urinalysis negative for signs of infection 10/30/2016. Velcade held. Referral made to urology.   Disposition: Abigail [name] appears stable. She is on active treatment with RVD. The most recent serum M spike and IgG were significantly improved. She has developed urinary retention of unclear etiology. We repeated a urinalysis today with no definite signs of infection. Urine culture is pending. Urinary retention has been associated with Velcade. We decided to hold Velcade today. She will proceed with the next cycle of Revlimid as planned on 11/07/2016.  She had a recent CT scan of the  abdomen/pelvis. A new right kidney mass was noted. We are making a referral to urology to evaluate the kidney mass and urinary retention.  She will return for a follow-up visit in 2 weeks. She will contact the office in the  interim with any problems. We specifically discussed inability to pass urine, back pain, leg weakness/numbness.   Patient seen with Dr. Benay Spice. CT images were reviewed on the computer with Abigail [name] and her friend. 30 minutes were spent face-to-face at today's visit with the majority of that time involved in counseling/coordination of care.   [name] ANP/GNP-BC   10/30/2016  2:33 PM  This was a shared visit with [name]. Abigail [name] was interviewed and examined. The etiology of the urinary retention is unclear. We will place the Velcade on hold. We referred her to urology for evaluation of the urinary symptoms and the renal mass noted on CT 1018.  Julieanne Manson, M.D.

## 2016-10-31 LAB — URINE CULTURE

## 2016-10-31 LAB — IGG: IGG (IMMUNOGLOBIN G), SERUM: 565 mg/dL — AB (ref 700–1600)

## 2016-11-01 ENCOUNTER — Encounter (HOSPITAL_COMMUNITY): Payer: Self-pay | Admitting: Emergency Medicine

## 2016-11-01 ENCOUNTER — Emergency Department (HOSPITAL_COMMUNITY)
Admission: EM | Admit: 2016-11-01 | Discharge: 2016-11-01 | Disposition: A | Discharge status: Home / Self Care | Payer: Medicare HMO | Attending: Emergency Medicine | Admitting: Emergency Medicine

## 2016-11-01 DIAGNOSIS — Z79899 Other long term (current) drug therapy: Secondary | ICD-10-CM | POA: Insufficient documentation

## 2016-11-01 DIAGNOSIS — Z7982 Long term (current) use of aspirin: Secondary | ICD-10-CM | POA: Insufficient documentation

## 2016-11-01 DIAGNOSIS — F17210 Nicotine dependence, cigarettes, uncomplicated: Secondary | ICD-10-CM | POA: Insufficient documentation

## 2016-11-01 DIAGNOSIS — I252 Old myocardial infarction: Secondary | ICD-10-CM | POA: Insufficient documentation

## 2016-11-01 DIAGNOSIS — I2510 Atherosclerotic heart disease of native coronary artery without angina pectoris: Secondary | ICD-10-CM | POA: Diagnosis not present

## 2016-11-01 DIAGNOSIS — R112 Nausea with vomiting, unspecified: Secondary | ICD-10-CM | POA: Diagnosis not present

## 2016-11-01 LAB — CBC
HEMATOCRIT: 33.5 % — AB (ref 36.0–46.0)
Hemoglobin: 11.4 g/dL — ABNORMAL LOW (ref 12.0–15.0)
MCH: 30.8 pg (ref 26.0–34.0)
MCHC: 34 g/dL (ref 30.0–36.0)
MCV: 90.5 fL (ref 78.0–100.0)
PLATELETS: 226 10*3/uL (ref 150–400)
RBC: 3.7 MIL/uL — ABNORMAL LOW (ref 3.87–5.11)
RDW: 16.1 % — AB (ref 11.5–15.5)
WBC: 6.4 10*3/uL (ref 4.0–10.5)

## 2016-11-01 LAB — COMPREHENSIVE METABOLIC PANEL
ALBUMIN: 4.1 g/dL (ref 3.5–5.0)
ALK PHOS: 72 U/L (ref 38–126)
ALT: 11 U/L — ABNORMAL LOW (ref 14–54)
ANION GAP: 8 (ref 5–15)
AST: 12 U/L — ABNORMAL LOW (ref 15–41)
BUN: 15 mg/dL (ref 6–20)
CHLORIDE: 112 mmol/L — AB (ref 101–111)
CO2: 19 mmol/L — AB (ref 22–32)
Calcium: 8.6 mg/dL — ABNORMAL LOW (ref 8.9–10.3)
Creatinine, Ser: 0.88 mg/dL (ref 0.44–1.00)
GFR calc non Af Amer: >60 mL/min (ref >60)
GLUCOSE: 106 mg/dL — AB (ref 65–99)
POTASSIUM: 3.8 mmol/L (ref 3.5–5.1)
Sodium: 139 mmol/L (ref 135–145)
Total Bilirubin: 0.6 mg/dL (ref 0.3–1.2)
Total Protein: 6.2 g/dL — ABNORMAL LOW (ref 6.5–8.1)

## 2016-11-01 LAB — URINALYSIS, ROUTINE W REFLEX MICROSCOPIC
BILIRUBIN URINE: NEGATIVE
Glucose, UA: NEGATIVE mg/dL
HGB URINE DIPSTICK: NEGATIVE
KETONES UR: NEGATIVE mg/dL
Leukocytes, UA: NEGATIVE
NITRITE: NEGATIVE
PROTEIN: NEGATIVE mg/dL
SPECIFIC GRAVITY, URINE: 1.006 (ref 1.005–1.030)
pH: 7 (ref 5.0–8.0)

## 2016-11-01 LAB — LIPASE, BLOOD: Lipase: 41 U/L (ref 11–51)

## 2016-11-01 MED ORDER — SCOPOLAMINE 1 MG/3DAYS TD PT72
1.0000 | MEDICATED_PATCH | Freq: Once | TRANSDERMAL | Status: DC
  Administered 2016-11-01: 1.5 mg via TRANSDERMAL
  Filled 2016-11-01: qty 1

## 2016-11-01 MED ORDER — ONDANSETRON HCL 4 MG/2ML IJ SOLN
4.0000 mg | Freq: Once | INTRAMUSCULAR | Status: AC | PRN
Start: 2016-11-01 — End: 2016-11-01
  Administered 2016-11-01: 4 mg via INTRAVENOUS
  Filled 2016-11-01: qty 2

## 2016-11-01 MED ORDER — SODIUM CHLORIDE 0.9 % IV BOLUS (SEPSIS)
1000.0000 mL | Freq: Once | INTRAVENOUS | Status: AC
  Administered 2016-11-01: 1000 mL via INTRAVENOUS

## 2016-11-01 MED ORDER — MORPHINE SULFATE (PF) 4 MG/ML IV SOLN
4.0000 mg | Freq: Once | INTRAVENOUS | Status: AC
  Administered 2016-11-01: 4 mg via INTRAVENOUS
  Filled 2016-11-01: qty 1

## 2016-11-01 MED ORDER — ONDANSETRON HCL 4 MG/2ML IJ SOLN
4.0000 mg | Freq: Once | INTRAMUSCULAR | Status: AC
  Administered 2016-11-01: 4 mg via INTRAVENOUS
  Filled 2016-11-01: qty 2

## 2016-11-01 NOTE — ED Provider Notes (Signed)
[facility] DEPT Provider Note   CSN: [id number] Arrival date & time: 11/01/16  1443     History   Chief Complaint Chief Complaint  Patient presents with  . Urinary Retention  . Abdominal Pain    HPI Abigail [name] is a 71 y.o. female.  Patient presents reevaluation of symptoms she was evaluated for 4 days ago. This includes a sensation of needing to urinate but not urinating "very much" abdominal pain and vomiting. Patient has a history of multiple myeloma. He is under care of her oncologist. His follow-up with oncology since her ER visit. Their note alleges that her urinary retention may have been secondary to one of her chemotherapeutic agents which will be held. During evaluation 4 days ago CT scan showed new nonspecific renal lesion. The patient MRI was recommended. This was discussed at her oncologyshe has been referred to urology regarding her voiding symptoms, and the new renal mass.  She has urinated daily since her last evaluation but "not as much as a used to". Her kidney functions very between 1.0 and 1.5 and creatinine. No diarrhea. No fever.   Patient started on RVD Chemo in November with Revlimid q2weeks, and Velcade & Dexamethasone weekly. Thus, Velcade has been put on hold and thought to havePossibly been related to her urinary retention symptoms.      HPI Past Medical History:  Diagnosis Date  . Anemia 07/2015  . Bilateral renal artery stenosis ([place]) 1999   s/p stenting  . Coronary artery disease 1999  . Depression   . Diverticulosis   . Duodenitis   . Gastric erosions   . Gastric ulcer   . GERD (gastroesophageal reflux disease)   . Heart murmur   . History of blood transfusion ~ 03/2016   "low HgB; practically nonexistent"  . Hyperlipidemia   . Hypertension   . MI (myocardial infarction) 1999  . Multiple myeloma ([place])   . Retinal hemorrhage, right eye   . Schatzki's ring   . Tubular adenoma of colon     Patient Active Problem List   Diagnosis  Date Noted  . Multiple myeloma ([place]) 06/18/2016  . Malnutrition of moderate degree 06/14/2016  . SOB (shortness of breath) 06/13/2016  . 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 Shortness of breath at rest 01/11/2016  . Exertional dyspnea 11/01/2015  . Coronary atherosclerosis of native coronary artery 11/01/2015  . Shortness of breath 11/01/2015  . Chest pain 12/31/2011    Past Surgical History:  Procedure Laterality Date  . APPENDECTOMY    . BACK SURGERY    . CATARACT EXTRACTION W/ INTRAOCULAR LENS IMPLANT Right 2014  . CORONARY ANGIOPLASTY WITH STENT PLACEMENT  1999  . ESOPHAGOGASTRODUODENOSCOPY N/A 11/03/2015   Procedure: ESOPHAGOGASTRODUODENOSCOPY (EGD);  Surgeon: Carol Ada, MD;  Location: [facility] ENDOSCOPY;  Service: Endoscopy;  Laterality: N/A;  . LUMBAR [place]    . RENAL ARTERY STENT  1999   bil RAS so ? bil vs unilateral stents  . RETINAL LASER PROCEDURE Right 2012   "bleeding"  . TONSILLECTOMY    . TOTAL ABDOMINAL HYSTERECTOMY      OB History    No data available       Home Medications    Prior to Admission medications   Medication Sig Start Date End Date Taking? Authorizing Provider  acyclovir (ZOVIRAX) 400 MG tablet TAKE 1 TABLET (400 MG TOTAL) BY MOUTH 2 (TWO) TIMES DAILY. 08/29/16  Yes Ladell Pier, MD  amLODipine ([place])  10 MG tablet Take 10 mg by mouth at bedtime.  01/03/16  Yes Historical Provider, MD  Ascorbic Acid (VITAMIN C) 1000 MG tablet Take 1,000 mg by mouth daily.    Yes Historical Provider, MD  aspirin EC 81 MG tablet Take 81 mg by mouth daily.   Yes Historical Provider, MD  calcium carbonate (OS-CAL - DOSED IN MG OF ELEMENTAL CALCIUM) 1250 (500 Ca) MG tablet Take 1 tablet (500 mg of elemental calcium total) by mouth daily with breakfast. 06/19/16  Yes [name], MD  Cholecalciferol (VITAMIN D PO) Take 1 capsule by mouth daily.   Yes Historical Provider, MD  dexamethasone (DECADRON) 4 MG tablet Decadron 40  mg once a week on day of Velcade injection Patient taking differently: Take 20 mg by mouth once a week. Decadron 40 mg once a week on day of Velcade injection  07/24/16  Yes Owens Shark, NP  FLUoxetine (PROZAC) 10 MG capsule Take 10 mg by mouth at bedtime.    Yes Historical Provider, MD  isosorbide mononitrate (IMDUR) 60 MG 24 hr tablet Take 1 tablet (60 mg total) by mouth daily. 06/19/16  Yes [name], MD  KLOR-CON M10 10 MEQ tablet Take 10 mEq by mouth daily.  06/13/16  Yes Historical Provider, MD  lenalidomide (REVLIMID) 15 MG capsule Take 1 capsule (15 mg total) by mouth daily. For 14 days, then 14 days off. 10/03/16  Yes Ladell Pier, MD  ondansetron (ZOFRAN) 4 MG tablet Take 1 tablet (4 mg total) by mouth every 8 (eight) hours as needed for nausea or vomiting. 10/27/16  Yes Nicole Pisciotta, PA-C  pantoprazole (PROTONIX) 40 MG tablet Take 1 tablet (40 mg total) by mouth daily. Reported on 10/19/2015 01/14/16  Yes [name], MD  pravastatin (PRAVACHOL) 40 MG tablet Take 40 mg by mouth every evening. 06/03/15  Yes Historical Provider, MD  promethazine (PHENERGAN) 25 MG suppository Place 1 suppository (25 mg total) rectally every 6 (six) hours as needed for nausea or vomiting. Patient not taking: Reported on 11/01/2016 10/27/16   Monico Blitz, PA-C    Family History Family History  Problem Relation Age of Onset  . Colon cancer Neg Hx     Social History Social History  Substance Use Topics  . Smoking status: Current Every Day Smoker    Packs/day: 0.50    Years: 48.00    Types: Cigarettes  . Smokeless tobacco: Never Used  . Alcohol use No     Allergies   Sulfa antibiotics   Review of Systems Review of Systems  Constitutional: Negative for appetite change, chills, diaphoresis, fatigue and fever.  HENT: Negative for mouth sores, sore throat and trouble swallowing.   Eyes: Negative for visual disturbance.  Respiratory: Negative for cough, chest tightness, shortness of  breath and wheezing.   Cardiovascular: Negative for chest pain.  Gastrointestinal: Positive for abdominal pain and nausea. Negative for abdominal distention, diarrhea and vomiting.  Endocrine: Negative for polydipsia, polyphagia and polyuria.  Genitourinary: Positive for decreased urine volume. Negative for dysuria, frequency and hematuria.  Musculoskeletal: Negative for gait problem.  Skin: Negative for color change, pallor and rash.  Neurological: Negative for dizziness, syncope, light-headedness and headaches.  Hematological: Does not bruise/bleed easily.  Psychiatric/Behavioral: Negative for behavioral problems and confusion.     Physical Exam Updated Vital Signs BP 152/82 (BP Location: Right Arm)   Pulse (!) 56   Temp 98.5 F (36.9 C) (Oral)   Resp 16   SpO2 96%  Physical Exam  Constitutional: She is oriented to person, place, and time. She appears well-developed and well-nourished. No distress.  HENT:  Head: Normocephalic.  Eyes: Conjunctivae are normal. Pupils are equal, round, and reactive to light. No scleral icterus.  Neck: Normal range of motion. Neck supple. No thyromegaly present.  Cardiovascular: Normal rate and regular rhythm.  Exam reveals no gallop and no friction rub.   No murmur heard. Pulmonary/Chest: Effort normal and breath sounds normal. No respiratory distress. She has no wheezes. She has no rales.  Abdominal: Soft. Bowel sounds are normal. She exhibits no distension. There is no tenderness. There is no rebound.  Musculoskeletal: Normal range of motion.  Neurological: She is alert and oriented to person, place, and time.  Skin: Skin is warm and dry. No rash noted.  Psychiatric: She has a normal mood and affect. Her behavior is normal.     ED Treatments / Results  Labs (all labs ordered are listed, but only abnormal results are displayed) Labs Reviewed  URINALYSIS, ROUTINE W REFLEX MICROSCOPIC - Abnormal; Notable for the following:       Result  Value   Color, Urine STRAW (*)    All other components within normal limits  COMPREHENSIVE METABOLIC PANEL - Abnormal; Notable for the following:    Chloride 112 (*)    CO2 19 (*)    Glucose, Bld 106 (*)    Calcium 8.6 (*)    Total Protein 6.2 (*)    AST 12 (*)    ALT 11 (*)    All other components within normal limits  CBC - Abnormal; Notable for the following:    RBC 3.70 (*)    Hemoglobin 11.4 (*)    HCT 33.5 (*)    RDW 16.1 (*)    All other components within normal limits  LIPASE, BLOOD    EKG  EKG Interpretation None       Radiology No results found.  Procedures Procedures (including critical care time)  Medications Ordered in ED Medications  scopolamine (TRANSDERM-SCOP) 1 MG/3DAYS 1.5 mg (not administered)  ondansetron (ZOFRAN) injection 4 mg (4 mg Intravenous Given 11/01/16 0748)  ondansetron (ZOFRAN) injection 4 mg (4 mg Intravenous Given 11/01/16 0827)  morphine 4 MG/ML injection 4 mg (4 mg Intravenous Given 11/01/16 0827)  sodium chloride 0.9 % bolus 1,000 mL (0 mLs Intravenous Stopped 11/01/16 1107)     Initial Impression / Assessment and Plan / ED Course  I have reviewed the triage vital signs and the nursing notes.  Pertinent labs & imaging results that were available during my care of the patient were reviewed by me and considered in my medical decision making (see chart for details).  Clinical Course     Bedside Ultrasound does show small volume urine in the bladder. Bladder scan showed 150. We'll plan hydration. Attempted bladder emptying. Lab evaluation. Antiemetic.  Final Clinical Impressions(s) / ED Diagnoses   Final diagnoses:  Nausea and vomiting, intractability of vomiting not specified, unspecified vomiting type    Patient improved and taking by mouth after the above measures.  Able to urinate. Postvoid less than 100 mL.  Labs are reassuring. Does not appear clinically dehydrated. Creatinine of 0.88. Cell count stable. Givens a  scopolamine patch here for her nausea. We'll continue her Zofran as needed at home. Family was warned that if she becomes confused or sedate to remove patch. Otherwise follow up with her physician regarding ongoing timing and dosing of her chemotherapy.  New Prescriptions New  Prescriptions   No medications on file     Tanna Furry, MD 11/01/16 1143

## 2016-11-01 NOTE — ED Triage Notes (Addendum)
Pt with hx multiple myeloma, c/o brown emesis and LLQ abdominal pain. 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 No blood in emesis, no diarrhea. Unable to urinate, only urinated 4-5 times since Thursday, which was minimal. Pt is drinking fluids. Gets chemo injections.

## 2016-11-01 NOTE — ED Notes (Signed)
Discharge instructions and follow up care reviewed with patient. Patient verbalized understanding. 

## 2016-11-01 NOTE — ED Notes (Signed)
Per laboratory, this patient needs a recollect on all labs. Main lab phlebotomy made aware and states they will come attempt blood draw from patient.

## 2016-11-01 NOTE — ED Notes (Addendum)
Attempted to obtain blood work from IV insertion without success. Phlebotomy made aware. Patient aware of urine sample. Patient states she cannot void at this time. EDP aware.

## 2016-11-01 NOTE — Discharge Instructions (Signed)
Scopolamine patch in place in the emergency room should help her nausea.  If patient becomes confused or overly sleepy, remove patch.  Follow-up with Dr. Learta Codding.

## 2016-11-01 NOTE — ED Notes (Signed)
Attempted blood draw x2. Was unsuccessful.

## 2016-11-01 NOTE — ED Notes (Signed)
Pt. Was stuck x3 for labs.  CBC was sent done to the lab. Main Phlebotomist was called x5 but no answer. Nurse aware.

## 2016-11-03 ENCOUNTER — Telehealth: Payer: Self-pay | Admitting: *Deleted

## 2016-11-03 LAB — PROTEIN ELECTROPHORESIS, SERUM
A/G Ratio: 1.4 (ref 0.7–1.7)
ALBUMIN: 3.6 g/dL (ref 2.9–4.4)
ALPHA 1: 0.3 g/dL (ref 0.0–0.4)
Alpha 2: 0.9 g/dL (ref 0.4–1.0)
Beta: 0.8 g/dL (ref 0.7–1.3)
Gamma Globulin: 0.6 g/dL (ref 0.4–1.8)
Globulin, Total: 2.5 g/dL (ref 2.2–3.9)
M-Spike, %: 0.4 g/dL — ABNORMAL HIGH
TOTAL PROTEIN: 6.1 g/dL (ref 6.0–8.5)

## 2016-11-03 MED ORDER — PROCHLORPERAZINE MALEATE 5 MG PO TABS: 5.0000 mg | ORAL_TABLET | Freq: Four times a day (QID) | ORAL | 0 refills | Status: DC | PRN

## 2016-11-03 NOTE — Telephone Encounter (Signed)
Pt reports nausea and LLQ pain. Pt was seen in ED on 1/13 and given a scopolamine patch. This was effective until today. Last BM on 1/14. Pt is concerned that she hasn't urinated since yesterday. Pt reports she is drinking water, ginger ale and coke. Estimates approximately 40oz liquids PO today.  Pt reports she has vomited 3 times today.  Due to resume Revlimid on 1/19. Reviewed with Dr. Benay Spice: Add Compazine for nausea. Continue Zofran. Call office in AM if nausea and pain persist. Pt voiced understanding. Script sent electronically.

## 2016-11-04 ENCOUNTER — Telehealth: Payer: Self-pay | Admitting: Oncology

## 2016-11-04 ENCOUNTER — Ambulatory Visit (HOSPITAL_BASED_OUTPATIENT_CLINIC_OR_DEPARTMENT_OTHER): Payer: Medicare HMO | Admitting: Nurse Practitioner

## 2016-11-04 ENCOUNTER — Ambulatory Visit (HOSPITAL_BASED_OUTPATIENT_CLINIC_OR_DEPARTMENT_OTHER): Payer: Medicare HMO

## 2016-11-04 ENCOUNTER — Telehealth: Payer: Self-pay | Admitting: *Deleted

## 2016-11-04 VITALS — BP 165/68 | HR 66 | Temp 97.7°F | Resp 18 | Ht 65.5 in | Wt 177.1 lb

## 2016-11-04 DIAGNOSIS — R5381 Other malaise: Secondary | ICD-10-CM

## 2016-11-04 DIAGNOSIS — R1032 Left lower quadrant pain: Secondary | ICD-10-CM

## 2016-11-04 DIAGNOSIS — R1084 Generalized abdominal pain: Secondary | ICD-10-CM | POA: Diagnosis not present

## 2016-11-04 DIAGNOSIS — C9000 Multiple myeloma not having achieved remission: Secondary | ICD-10-CM | POA: Diagnosis not present

## 2016-11-04 DIAGNOSIS — R5383 Other fatigue: Secondary | ICD-10-CM | POA: Diagnosis not present

## 2016-11-04 DIAGNOSIS — R112 Nausea with vomiting, unspecified: Secondary | ICD-10-CM

## 2016-11-04 LAB — CBC WITH DIFFERENTIAL/PLATELET
BASO%: 0.7 % (ref 0.0–2.0)
Basophils Absolute: 0 10*3/uL (ref 0.0–0.1)
EOS%: 2.1 % (ref 0.0–7.0)
Eosinophils Absolute: 0.1 10*3/uL (ref 0.0–0.5)
HCT: 37.8 % (ref 34.8–46.6)
HEMOGLOBIN: 12.2 g/dL (ref 11.6–15.9)
LYMPH#: 1.3 10*3/uL (ref 0.9–3.3)
LYMPH%: 30.6 % (ref 14.0–49.7)
MCH: 30.3 pg (ref 25.1–34.0)
MCHC: 32.3 g/dL (ref 31.5–36.0)
MCV: 93.8 fL (ref 79.5–101.0)
MONO#: 0.8 10*3/uL (ref 0.1–0.9)
MONO%: 19.2 % — ABNORMAL HIGH (ref 0.0–14.0)
NEUT%: 47.4 % (ref 38.4–76.8)
NEUTROS ABS: 2.1 10*3/uL (ref 1.5–6.5)
NRBC: 0 % (ref 0–0)
Platelets: 324 10*3/uL (ref 145–400)
RBC: 4.03 10*6/uL (ref 3.70–5.45)
RDW: 16.4 % — AB (ref 11.2–14.5)
WBC: 4.4 10*3/uL (ref 3.9–10.3)

## 2016-11-04 LAB — COMPREHENSIVE METABOLIC PANEL
ALBUMIN: 3.9 g/dL (ref 3.5–5.0)
ALT: 10 U/L (ref 0–55)
AST: 14 U/L (ref 5–34)
Alkaline Phosphatase: 105 U/L (ref 40–150)
Anion Gap: 11 mEq/L (ref 3–11)
BUN: 10 mg/dL (ref 7.0–26.0)
CO2: 20 mEq/L — ABNORMAL LOW (ref 22–29)
Calcium: 9.5 mg/dL (ref 8.4–10.4)
Chloride: 112 mEq/L — ABNORMAL HIGH (ref 98–109)
Creatinine: 1 mg/dL (ref 0.6–1.1)
EGFR: 64 mL/min/{1.73_m2} — ABNORMAL LOW (ref >90)
GLUCOSE: 98 mg/dL (ref 70–140)
POTASSIUM: 3.9 meq/L (ref 3.5–5.1)
SODIUM: 143 meq/L (ref 136–145)
Total Bilirubin: 0.4 mg/dL (ref 0.20–1.20)
Total Protein: 6.6 g/dL (ref 6.4–8.3)

## 2016-11-04 MED ORDER — HYDROCODONE-ACETAMINOPHEN 5-325 MG PO TABS
1.0000 | ORAL_TABLET | Freq: Once | ORAL | Status: AC
  Administered 2016-11-04: 1 via ORAL

## 2016-11-04 MED ORDER — HYDROCODONE-ACETAMINOPHEN 5-325 MG PO TABS
ORAL_TABLET | ORAL | Status: AC
  Filled 2016-11-04: qty 1

## 2016-11-04 MED ORDER — HYDROCODONE-ACETAMINOPHEN 5-325 MG PO TABS: 1.0000 | ORAL_TABLET | Freq: Four times a day (QID) | ORAL | 0 refills | Status: DC | PRN

## 2016-11-04 NOTE — Telephone Encounter (Signed)
Pt has appt with [facility] Dr Alyson Ingles 12/15/16 at 10 am

## 2016-11-04 NOTE — Telephone Encounter (Signed)
Pt returned call to report LLQ abdominal pain persists. Nausea has resolved. Last BM 1/14. Constant pain. Reviewed with Dr. Benay Spice: Work in to [facility] for evaluation of pain. Message to schedulers, Drue Second, NP made aware. Lab orders received.

## 2016-11-05 ENCOUNTER — Encounter: Payer: Self-pay | Admitting: *Deleted

## 2016-11-05 ENCOUNTER — Telehealth: Payer: Self-pay | Admitting: Nurse Practitioner

## 2016-11-05 ENCOUNTER — Encounter: Payer: Self-pay | Admitting: Nurse Practitioner

## 2016-11-05 DIAGNOSIS — R109 Unspecified abdominal pain: Secondary | ICD-10-CM | POA: Insufficient documentation

## 2016-11-05 NOTE — Telephone Encounter (Signed)
Called to check in on the patient this morning.  She stated that she was feeling some better now that she has some pain medicine.  She last took pain medicine in approximately 3 AM this morning.  She states she also slept much better last night as well.  She denies any recent fevers or chills.  She also denies any recent vomiting.  Also, advised patient that the urologist office is closed today due to inclement weather.  Advised the patient that this provider would contact the urology office first thing in the morning if they are open-to arrange for an urgent visit with the urologist.  In the meantime-patient was advised to go directly to the emergency department with any worsening symptoms whatsoever.  Patient stated understanding of all instructions and was in agreement with this plan of care.

## 2016-11-05 NOTE — Assessment & Plan Note (Addendum)
Patient states that she has been holding both the Velcade injections in the Revlimid oral therapy for the past 1-2 weeks due to her complaints of abdominal discomfort.  She presented to the emergency department this past Saturday, 11/01/2016 for the same complaints.  She complains of left upper to middle abdominal discomfort that radiates to the left flank.  She denies any right sided abdominal discomfort.  She states that she has been suffering with some chronic nausea; and has had frequent bouts of vomiting as well.  She states she's vomited twice today.  She has been taking Zofran as directed.  Yesterday she was called in Compazine to try as well.  She states that Compazine is helped with the nausea much better than the Zofran.  She continues able to drink this had little food recently.  She denies any recent fevers or chills.  While in the emergency department this past weekend.  She did undergo a CT of the abdomen and pelvis which revealed new found lesions to both the left and right kidney.  Patient has been scheduled for an initial consult with urologist for further evaluation of the lesions and her abdominal discomfort on 01/12/2017.  Also, urinalysis obtained over this past weekend in the emergency department revealed no UTI.  Patient also denies any UTI symptoms whatsoever.  Exam today of the patient revealed bowel sounds positive in all 4 quads.  Abdomen was soft; but there was some trace tenderness to the left upper quadrant radiating to the left flank.  There is no evidence of rash noted.  Labs obtained today were all essentially within normal limits.  Vital signs were stable with the exception of a low-grade temperature of 99.4.  This provider was able to speak to the urology office triage nurse; and she advised that she would attempt to get the patient in for an urgent appointment within the next 1-2 days.  The plan is for this provider to communicate with the urology triage nurse again  tomorrow to see when the patient can be seen.  In the meantime-patient was advised to call/return or go directly to the emergency department for any worsening symptoms whatsoever.  Also, patient was given hydrocodone prescription to take as directed as well.  She was given 1 Vicodin tablet while she was at the [place].

## 2016-11-05 NOTE — Progress Notes (Signed)
SYMPTOM MANAGEMENT CLINIC    Chief Complaint: Abdominal pain  HPI:  Abigail [name] 71 y.o. female diagnosed with multiple myeloma.  Currently undergoing Velcade injections and Revlimid oral therapy.    No history exists.    Review of Systems  Constitutional: Positive for malaise/fatigue.  Gastrointestinal: Positive for abdominal pain, nausea and vomiting.  All other systems reviewed and are negative.   Past Medical History:  Diagnosis Date  . Anemia 07/2015  . Bilateral renal artery stenosis ([place]) 1999   s/p stenting  . Coronary artery disease 1999  . Depression   . Diverticulosis   . Duodenitis   . Gastric erosions   . Gastric ulcer   . GERD (gastroesophageal reflux disease)   . Heart murmur   . History of blood transfusion ~ 03/2016   "low HgB; practically nonexistent"  . Hyperlipidemia   . Hypertension   . MI (myocardial infarction) 1999  . Multiple myeloma ([place])   . Retinal hemorrhage, right eye   . Schatzki's ring   . Tubular adenoma of colon     Past Surgical History:  Procedure Laterality Date  . APPENDECTOMY    . BACK SURGERY    . CATARACT EXTRACTION W/ INTRAOCULAR LENS IMPLANT Right 2014  . CORONARY ANGIOPLASTY WITH STENT PLACEMENT  1999  . ESOPHAGOGASTRODUODENOSCOPY N/A 11/03/2015   Procedure: ESOPHAGOGASTRODUODENOSCOPY (EGD);  Surgeon: Carol Ada, MD;  Location: [facility] ENDOSCOPY;  Service: Endoscopy;  Laterality: N/A;  . LUMBAR [place]    . RENAL ARTERY STENT  1999   bil RAS so ? bil vs unilateral stents  . RETINAL LASER PROCEDURE Right 2012   "bleeding"  . TONSILLECTOMY    . TOTAL ABDOMINAL HYSTERECTOMY      has Chest pain; Exertional dyspnea; Coronary atherosclerosis of native coronary artery; Shortness of breath; Iron deficiency anemia secondary to blood loss (chronic); Malnutrition of moderate degree; Multiple myeloma ([place]); and Abdominal pain on her problem list.    is allergic to sulfa antibiotics.  Allergies as of 11/04/2016    Reactions   Sulfa Antibiotics Rash      Medication List       Accurate as of 11/04/16 11:59 PM. Always use your most recent med list.          acyclovir 400 MG tablet Commonly known as:  ZOVIRAX TAKE 1 TABLET (400 MG TOTAL) BY MOUTH 2 (TWO) TIMES DAILY.   amLODipine 10 MG tablet Commonly known as:  NORVASC Take 10 mg by mouth at bedtime.   aspirin EC 81 MG tablet Take 81 mg by mouth daily.   calcium carbonate 1250 (500 Ca) MG tablet Commonly known as:  OS-CAL - dosed in mg of elemental calcium Take 1 tablet (500 mg of elemental calcium total) by mouth daily with breakfast.   dexamethasone 4 MG tablet Commonly known as:  DECADRON Decadron 40 mg once a week on day of Velcade injection   FLUoxetine 10 MG capsule Commonly known as:  PROZAC Take 10 mg by mouth at bedtime.   HYDROcodone-acetaminophen 5-325 MG tablet Commonly known as:  NORCO/VICODIN Take 1-2 tablets by mouth every 6 (six) hours as needed for moderate pain.   isosorbide mononitrate 60 MG 24 hr tablet Commonly known as:  IMDUR Take 1 tablet (60 mg total) by mouth daily.   KLOR-CON M10 10 MEQ tablet Generic drug:  potassium chloride Take 10 mEq by mouth daily.   lenalidomide 15 MG capsule Commonly known as:  REVLIMID Take 1 capsule (15 mg  total) by mouth daily. For 14 days, then 14 days off.   ondansetron 4 MG tablet Commonly known as:  ZOFRAN Take 1 tablet (4 mg total) by mouth every 8 (eight) hours as needed for nausea or vomiting.   pantoprazole 40 MG tablet Commonly known as:  PROTONIX Take 1 tablet (40 mg total) by mouth daily. Reported on 10/19/2015   pravastatin 40 MG tablet Commonly known as:  PRAVACHOL Take 40 mg by mouth every evening.   prochlorperazine 5 MG tablet Commonly known as:  COMPAZINE Take 1 tablet (5 mg total) by mouth every 6 (six) hours as needed for nausea or vomiting.   promethazine 25 MG suppository Commonly known as:  PHENERGAN Place 1 suppository (25 mg total)  rectally every 6 (six) hours as needed for nausea or vomiting.   vitamin C 1000 MG tablet Take 1,000 mg by mouth daily.   VITAMIN D PO Take 1 capsule by mouth daily.        PHYSICAL EXAMINATION  Oncology Vitals 11/04/2016 11/01/2016  Height 166 cm -  Weight 80.332 kg -  Weight (lbs) 177 lbs 2 oz -  BMI (kg/m2) 29.02 kg/m2 -  Temp 97.7 -  Pulse 66 57  Resp 18 16  SpO2 98 98  BSA (m2) 1.93 m2 -   BP Readings from Last 2 Encounters:  11/04/16 (!) 165/68  11/01/16 145/60    Physical Exam  Constitutional: She is oriented to person, place, and time. She appears malnourished. She appears unhealthy. She appears cachectic.  HENT:  Head: Normocephalic and atraumatic.  Mouth/Throat: Oropharynx is clear and moist.  Eyes: Conjunctivae and EOM are normal. Pupils are equal, round, and reactive to light. Right eye exhibits no discharge. Left eye exhibits no discharge. No scleral icterus.  Neck: Normal range of motion. Neck supple. No JVD present. No tracheal deviation present. No thyromegaly present.  Cardiovascular: Normal rate, regular rhythm, normal heart sounds and intact distal pulses.   Pulmonary/Chest: Effort normal and breath sounds normal. No respiratory distress. She has no wheezes. She has no rales. She exhibits no tenderness.  Abdominal: Soft. Bowel sounds are normal. She exhibits no distension and no mass. There is tenderness. There is no rebound and no guarding.  Musculoskeletal: Normal range of motion. She exhibits no edema or tenderness.  Lymphadenopathy:    She has no cervical adenopathy.  Neurological: She is alert and oriented to person, place, and time. Gait normal.  Skin: Skin is warm and dry. No rash noted. No erythema. No pallor.  Psychiatric: Affect normal.  Nursing note and vitals reviewed.   LABORATORY DATA:. Appointment on 11/04/2016  Component Date Value Ref Range Status  . WBC 11/04/2016 4.4  3.9 - 10.3 10e3/uL Final  . NEUT# 11/04/2016 2.1  1.5 - 6.5  10e3/uL Final  . HGB 11/04/2016 12.2  11.6 - 15.9 g/dL Final  . HCT 11/04/2016 37.8  34.8 - 46.6 % Final  . Platelets 11/04/2016 324  145 - 400 10e3/uL Final  . MCV 11/04/2016 93.8  79.5 - 101.0 fL Final  . MCH 11/04/2016 30.3  25.1 - 34.0 pg Final  . MCHC 11/04/2016 32.3  31.5 - 36.0 g/dL Final  . RBC 11/04/2016 4.03  3.70 - 5.45 10e6/uL Final  . RDW 11/04/2016 16.4* 11.2 - 14.5 % Final  . lymph# 11/04/2016 1.3  0.9 - 3.3 10e3/uL Final  . MONO# 11/04/2016 0.8  0.1 - 0.9 10e3/uL Final  . Eosinophils Absolute 11/04/2016 0.1  0.0 - 0.5 10e3/uL  Final  . Basophils Absolute 11/04/2016 0.0  0.0 - 0.1 10e3/uL Final  . NEUT% 11/04/2016 47.4  38.4 - 76.8 % Final  . LYMPH% 11/04/2016 30.6  14.0 - 49.7 % Final  . MONO% 11/04/2016 19.2* 0.0 - 14.0 % Final  . EOS% 11/04/2016 2.1  0.0 - 7.0 % Final  . BASO% 11/04/2016 0.7  0.0 - 2.0 % Final  . nRBC 11/04/2016 0  0 - 0 % Final  . Sodium 11/04/2016 143  136 - 145 mEq/L Final  . Potassium 11/04/2016 3.9  3.5 - 5.1 mEq/L Final  . Chloride 11/04/2016 112* 98 - 109 mEq/L Final  . CO2 11/04/2016 20* 22 - 29 mEq/L Final  . Glucose 11/04/2016 98  70 - 140 mg/dl Final  . BUN 11/04/2016 10.0  7.0 - 26.0 mg/dL Final  . Creatinine 11/04/2016 1.0  0.6 - 1.1 mg/dL Final  . Total Bilirubin 11/04/2016 0.40  0.20 - 1.20 mg/dL Final  . Alkaline Phosphatase 11/04/2016 105  40 - 150 U/L Final  . AST 11/04/2016 14  5 - 34 U/L Final  . ALT 11/04/2016 10  0 - 55 U/L Final  . Total Protein 11/04/2016 6.6  6.4 - 8.3 g/dL Final  . Albumin 11/04/2016 3.9  3.5 - 5.0 g/dL Final  . Calcium 11/04/2016 9.5  8.4 - 10.4 mg/dL Final  . Anion Gap 11/04/2016 11  3 - 11 mEq/L Final  . EGFR 11/04/2016 64* >90 ml/min/1.73 m2 Final    RADIOGRAPHIC STUDIES: No results found.  ASSESSMENT/PLAN:    Multiple myeloma ([facility]) Patient has been holding both her Velcade injections in her Revlimid oral therapy for the past 1-2 weeks due to her complaint of abdominal discomfort.  See further  notes for details.  Patient is scheduled to return on 11/13/2016 for labs, visit, and her next cycle of Velcade injections.  Abdominal pain Patient states that she has been holding both the Velcade injections in the Revlimid oral therapy for the past 1-2 weeks due to her complaints of abdominal discomfort.  She presented to the emergency department this past Saturday, 11/01/2016 for the same complaints.  She complains of left upper to middle abdominal discomfort that radiates to the left flank.  She denies any right sided abdominal discomfort.  She states that she has been suffering with some chronic nausea; and has had frequent bouts of vomiting as well.  She states she's vomited twice today.  She has been taking Zofran as directed.  Yesterday she was called in Compazine to try as well.  She states that Compazine is helped with the nausea much better than the Zofran.  She continues able to drink this had little food recently.  She denies any recent fevers or chills.  While in the emergency department this past weekend.  She did undergo a CT of the abdomen and pelvis which revealed new found lesions to both the left and right kidney.  Patient has been scheduled for an initial consult with urologist for further evaluation of the lesions and her abdominal discomfort on 01/12/2017.  Also, urinalysis obtained over this past weekend in the emergency department revealed no UTI.  Patient also denies any UTI symptoms whatsoever.  Exam today of the patient revealed bowel sounds positive in all 4 quads.  Abdomen was soft; but there was some trace tenderness to the left upper quadrant radiating to the left flank.  There is no evidence of rash noted.  Labs obtained today were all essentially within normal  limits.  Vital signs were stable with the exception of a low-grade temperature of 99.4.  This provider was able to speak to the urology office triage nurse; and she advised that she would attempt to get the  patient in for an urgent appointment within the next 1-2 days.  The plan is for this provider to communicate with the urology triage nurse again tomorrow to see when the patient can be seen.  In the meantime-patient was advised to call/return or go directly to the emergency department for any worsening symptoms whatsoever.  Also, patient was given hydrocodone prescription to take as directed as well.  She was given 1 Vicodin tablet while she was at the [facility].   Patient stated understanding of all instructions; and was in agreement with this plan of care. The patient knows to call the clinic with any problems, questions or concerns.   Total time spent with patient was 40 minutes;  with greater than 75 percent of that time spent in face to face counseling regarding patient's symptoms,  and coordination of care and follow up.  Disclaimer:This dictation was prepared with Dragon/digital dictation along with [employer]. Any transcriptional errors that result from this process are unintentional.  Drue Second, NP 11/05/2016

## 2016-11-05 NOTE — Assessment & Plan Note (Signed)
Patient has been holding both her Velcade injections in her Revlimid oral therapy for the past 1-2 weeks due to her complaint of abdominal discomfort.  See further notes for details.  Patient is scheduled to return on 11/13/2016 for labs, visit, and her next cycle of Velcade injections.

## 2016-11-05 NOTE — Progress Notes (Signed)
Per Evelena Peat in pharmacy, Revlimid prescription to be sent to [facility].

## 2016-11-07 ENCOUNTER — Other Ambulatory Visit: Payer: Self-pay | Admitting: *Deleted

## 2016-11-07 DIAGNOSIS — C9000 Multiple myeloma not having achieved remission: Secondary | ICD-10-CM

## 2016-11-07 MED ORDER — LENALIDOMIDE 15 MG PO CAPS: 15.0000 mg | ORAL_CAPSULE | Freq: Every day | ORAL | 0 refills | Status: DC

## 2016-11-09 ENCOUNTER — Other Ambulatory Visit: Payer: Self-pay | Admitting: Oncology

## 2016-11-10 ENCOUNTER — Telehealth: Payer: Self-pay | Admitting: Pharmacist

## 2016-11-10 NOTE — Telephone Encounter (Signed)
Oral Chemotherapy Pharmacist Encounter  Received call from [facility] with questions about filling patient's next Revlimid prescription faxed to them on 11/07/16. They had reached out to the patient to fill the Revlimid and patient stated she may be on a break due to liver issues.  Labs reviewed, no organ dysfunction noted. Patient has been having issues with urinary hesitation and is currently being evaluated for this.  Case discussed with MD, ok to proceed with filling prescription but have patient wait until she is seen on 1/25.  I called patient with no answer so I was not able to tell her not start Revlimid until she is seen.  I called [place] at [phone] to have them continue processing the prescription. They will direct patient to not start medication until she speaks with MD's office.  This note will be routed to Dr. Benay Spice and his collaborative practice RN.  Johny Drilling, PharmD, BCPS, BCOP 11/10/2016  10:55 AM Oral Oncology Clinic [phone]

## 2016-11-13 ENCOUNTER — Telehealth: Payer: Self-pay | Admitting: *Deleted

## 2016-11-13 ENCOUNTER — Encounter: Payer: Self-pay | Admitting: *Deleted

## 2016-11-13 ENCOUNTER — Other Ambulatory Visit (HOSPITAL_BASED_OUTPATIENT_CLINIC_OR_DEPARTMENT_OTHER): Payer: Medicare HMO

## 2016-11-13 ENCOUNTER — Ambulatory Visit: Payer: Medicare HMO

## 2016-11-13 ENCOUNTER — Ambulatory Visit (HOSPITAL_BASED_OUTPATIENT_CLINIC_OR_DEPARTMENT_OTHER): Payer: Medicare HMO | Admitting: Nurse Practitioner

## 2016-11-13 ENCOUNTER — Ambulatory Visit: Payer: Medicare HMO | Admitting: Nurse Practitioner

## 2016-11-13 ENCOUNTER — Encounter (HOSPITAL_COMMUNITY): Payer: Self-pay

## 2016-11-13 ENCOUNTER — Emergency Department (HOSPITAL_COMMUNITY)
Admission: EM | Admit: 2016-11-13 | Discharge: 2016-11-13 | Disposition: A | Discharge status: Home / Self Care | Payer: Medicare HMO | Attending: Emergency Medicine | Admitting: Emergency Medicine

## 2016-11-13 ENCOUNTER — Other Ambulatory Visit: Payer: Self-pay

## 2016-11-13 ENCOUNTER — Encounter: Payer: Self-pay | Admitting: Nurse Practitioner

## 2016-11-13 DIAGNOSIS — I2510 Atherosclerotic heart disease of native coronary artery without angina pectoris: Secondary | ICD-10-CM | POA: Diagnosis not present

## 2016-11-13 DIAGNOSIS — F17210 Nicotine dependence, cigarettes, uncomplicated: Secondary | ICD-10-CM | POA: Diagnosis not present

## 2016-11-13 DIAGNOSIS — R1032 Left lower quadrant pain: Secondary | ICD-10-CM

## 2016-11-13 DIAGNOSIS — Z7982 Long term (current) use of aspirin: Secondary | ICD-10-CM | POA: Insufficient documentation

## 2016-11-13 DIAGNOSIS — R1012 Left upper quadrant pain: Secondary | ICD-10-CM | POA: Diagnosis not present

## 2016-11-13 DIAGNOSIS — Z79899 Other long term (current) drug therapy: Secondary | ICD-10-CM | POA: Insufficient documentation

## 2016-11-13 DIAGNOSIS — Z955 Presence of coronary angioplasty implant and graft: Secondary | ICD-10-CM | POA: Diagnosis not present

## 2016-11-13 DIAGNOSIS — I10 Essential (primary) hypertension: Secondary | ICD-10-CM | POA: Insufficient documentation

## 2016-11-13 DIAGNOSIS — I252 Old myocardial infarction: Secondary | ICD-10-CM | POA: Insufficient documentation

## 2016-11-13 DIAGNOSIS — R112 Nausea with vomiting, unspecified: Secondary | ICD-10-CM | POA: Insufficient documentation

## 2016-11-13 DIAGNOSIS — C9000 Multiple myeloma not having achieved remission: Secondary | ICD-10-CM

## 2016-11-13 LAB — CBC WITH DIFFERENTIAL/PLATELET
BASO%: 0.7 % (ref 0.0–2.0)
BASOS ABS: 0 10*3/uL (ref 0.0–0.1)
EOS ABS: 0 10*3/uL (ref 0.0–0.5)
EOS%: 0 % (ref 0.0–7.0)
HCT: 41.5 % (ref 34.8–46.6)
HGB: 13.4 g/dL (ref 11.6–15.9)
LYMPH%: 11 % — AB (ref 14.0–49.7)
MCH: 30.2 pg (ref 25.1–34.0)
MCHC: 32.3 g/dL (ref 31.5–36.0)
MCV: 93.5 fL (ref 79.5–101.0)
MONO#: 0.1 10*3/uL (ref 0.1–0.9)
MONO%: 2.3 % (ref 0.0–14.0)
NEUT#: 3.8 10*3/uL (ref 1.5–6.5)
NEUT%: 86 % — ABNORMAL HIGH (ref 38.4–76.8)
PLATELETS: 214 10*3/uL (ref 145–400)
RBC: 4.44 10*6/uL (ref 3.70–5.45)
RDW: 16.2 % — ABNORMAL HIGH (ref 11.2–14.5)
WBC: 4.4 10*3/uL (ref 3.9–10.3)
lymph#: 0.5 10*3/uL — ABNORMAL LOW (ref 0.9–3.3)

## 2016-11-13 LAB — COMPREHENSIVE METABOLIC PANEL
ALT: 11 U/L (ref 0–55)
ANION GAP: 11 meq/L (ref 3–11)
AST: 16 U/L (ref 5–34)
Albumin: 4.4 g/dL (ref 3.5–5.0)
Alkaline Phosphatase: 111 U/L (ref 40–150)
BUN: 9.9 mg/dL (ref 7.0–26.0)
CALCIUM: 10 mg/dL (ref 8.4–10.4)
CHLORIDE: 109 meq/L (ref 98–109)
CO2: 22 meq/L (ref 22–29)
Creatinine: 0.9 mg/dL (ref 0.6–1.1)
EGFR: 71 mL/min/{1.73_m2} — AB (ref >90)
Glucose: 138 mg/dl (ref 70–140)
POTASSIUM: 4.1 meq/L (ref 3.5–5.1)
Sodium: 142 mEq/L (ref 136–145)
Total Bilirubin: 0.51 mg/dL (ref 0.20–1.20)
Total Protein: 7.4 g/dL (ref 6.4–8.3)

## 2016-11-13 LAB — URINALYSIS, ROUTINE W REFLEX MICROSCOPIC
Bacteria, UA: NONE SEEN
Bilirubin Urine: NEGATIVE
GLUCOSE, UA: NEGATIVE mg/dL
Hgb urine dipstick: NEGATIVE
KETONES UR: 5 mg/dL — AB
Leukocytes, UA: NEGATIVE
NITRITE: NEGATIVE
PROTEIN: 100 mg/dL — AB
Specific Gravity, Urine: 1.01 (ref 1.005–1.030)
pH: 7 (ref 5.0–8.0)

## 2016-11-13 LAB — I-STAT CG4 LACTIC ACID, ED: LACTIC ACID, VENOUS: 1.11 mmol/L (ref 0.5–1.9)

## 2016-11-13 MED ORDER — SODIUM CHLORIDE 0.9 % IV SOLN
INTRAVENOUS | Status: DC
  Administered 2016-11-13: 14:00:00 via INTRAVENOUS

## 2016-11-13 MED ORDER — ONDANSETRON HCL 4 MG/2ML IJ SOLN
INTRAMUSCULAR | Status: AC
  Filled 2016-11-13: qty 4

## 2016-11-13 MED ORDER — HYDROCODONE-ACETAMINOPHEN 5-325 MG PO TABS: 1.0000 | ORAL_TABLET | ORAL | 0 refills | Status: DC | PRN

## 2016-11-13 MED ORDER — SODIUM CHLORIDE 0.9 % IV BOLUS (SEPSIS)
1000.0000 mL | Freq: Once | INTRAVENOUS | Status: AC
  Administered 2016-11-13: 1000 mL via INTRAVENOUS

## 2016-11-13 MED ORDER — DOCUSATE SODIUM 100 MG PO CAPS: 100.0000 mg | ORAL_CAPSULE | Freq: Two times a day (BID) | ORAL | 0 refills | Status: DC

## 2016-11-13 MED ORDER — ONDANSETRON HCL 4 MG/2ML IJ SOLN
8.0000 mg | Freq: Once | INTRAMUSCULAR | Status: AC
  Administered 2016-11-13: 8 mg via INTRAVENOUS

## 2016-11-13 MED ORDER — SODIUM CHLORIDE 0.9 % IV SOLN
8.0000 mg | Freq: Once | INTRAVENOUS | Status: DC
Start: 2016-11-13 — End: 2016-11-13

## 2016-11-13 MED ORDER — ONDANSETRON HCL 4 MG/2ML IJ SOLN
4.0000 mg | Freq: Once | INTRAMUSCULAR | Status: AC
  Administered 2016-11-13: 4 mg via INTRAVENOUS
  Filled 2016-11-13: qty 2

## 2016-11-13 MED ORDER — HYDROMORPHONE HCL 1 MG/ML IJ SOLN
1.0000 mg | Freq: Once | INTRAMUSCULAR | Status: AC
  Administered 2016-11-13: 0.5 mg via INTRAVENOUS
  Filled 2016-11-13: qty 1

## 2016-11-13 MED ORDER — MORPHINE SULFATE (PF) 4 MG/ML IV SOLN
INTRAVENOUS | Status: AC
  Filled 2016-11-13: qty 1

## 2016-11-13 MED ORDER — MORPHINE SULFATE 4 MG/ML IJ SOLN
2.0000 mg | Freq: Once | INTRAMUSCULAR | Status: AC
Start: 2016-11-13 — End: 2016-11-13
  Administered 2016-11-13: 2 mg via INTRAVENOUS
  Filled 2016-11-13: qty 1

## 2016-11-13 NOTE — Progress Notes (Signed)
SYMPTOM MANAGEMENT CLINIC    Chief Complaint: Nausea, vomiting, abdomen pain  HPI:  Abigail [name] 71 y.o. female diagnosed with multiple myeloma.  Has been holding both the Revlimid oral therapy and the Velcade injections for the past 3 weeks due to abdominal discomfort.    No history exists.    Review of Systems  Constitutional: Positive for malaise/fatigue and weight loss.  Gastrointestinal: Positive for abdominal pain, nausea and vomiting.  All other systems reviewed and are negative.   Past Medical History:  Diagnosis Date  . Anemia 07/2015  . Bilateral renal artery stenosis ([place]) 1999   s/p stenting  . Coronary artery disease 1999  . Depression   . Diverticulosis   . Duodenitis   . Gastric erosions   . Gastric ulcer   . GERD (gastroesophageal reflux disease)   . Heart murmur   . History of blood transfusion ~ 03/2016   "low HgB; practically nonexistent"  . Hyperlipidemia   . Hypertension   . MI (myocardial infarction) 1999  . Multiple myeloma ([place])   . Retinal hemorrhage, right eye   . Schatzki's ring   . Tubular adenoma of colon     Past Surgical History:  Procedure Laterality Date  . APPENDECTOMY    . BACK SURGERY    . CATARACT EXTRACTION W/ INTRAOCULAR LENS IMPLANT Right 2014  . CORONARY ANGIOPLASTY WITH STENT PLACEMENT  1999  . ESOPHAGOGASTRODUODENOSCOPY N/A 11/03/2015   Procedure: ESOPHAGOGASTRODUODENOSCOPY (EGD);  Surgeon: Carol Ada, MD;  Location: [facility] ENDOSCOPY;  Service: Endoscopy;  Laterality: N/A;  . LUMBAR [place]    . RENAL ARTERY STENT  1999   bil RAS so ? bil vs unilateral stents  . RETINAL LASER PROCEDURE Right 2012   "bleeding"  . TONSILLECTOMY    . TOTAL ABDOMINAL HYSTERECTOMY      has Chest pain; Exertional dyspnea; Coronary atherosclerosis of native coronary artery; Shortness of breath; Iron deficiency anemia secondary to blood loss (chronic); Malnutrition of moderate degree; Multiple myeloma ([place]); and Abdominal pain on her  problem list.    is allergic to sulfa antibiotics.  Allergies as of 11/13/2016      Reactions   Sulfa Antibiotics Rash      Medication List       Accurate as of 11/13/16  3:32 PM. Always use your most recent med list.          acyclovir 400 MG tablet Commonly known as:  ZOVIRAX TAKE 1 TABLET (400 MG TOTAL) BY MOUTH 2 (TWO) TIMES DAILY.   amLODipine 10 MG tablet Commonly known as:  NORVASC Take 10 mg by mouth at bedtime.   aspirin EC 81 MG tablet Take 81 mg by mouth daily.   calcium carbonate 1250 (500 Ca) MG tablet Commonly known as:  OS-CAL - dosed in mg of elemental calcium Take 1 tablet (500 mg of elemental calcium total) by mouth daily with breakfast.   dexamethasone 4 MG tablet Commonly known as:  DECADRON Decadron 40 mg once a week on day of Velcade injection   FLUoxetine 10 MG capsule Commonly known as:  PROZAC Take 10 mg by mouth at bedtime.   HYDROcodone-acetaminophen 5-325 MG tablet Commonly known as:  NORCO/VICODIN Take 1-2 tablets by mouth every 6 (six) hours as needed for moderate pain.   isosorbide mononitrate 60 MG 24 hr tablet Commonly known as:  IMDUR Take 1 tablet (60 mg total) by mouth daily.   KLOR-CON M10 10 MEQ tablet Generic drug:  potassium chloride  Take 10 mEq by mouth daily.   lenalidomide 15 MG capsule Commonly known as:  REVLIMID Take 1 capsule (15 mg total) by mouth daily. For 14 days, then 14 days off.   ondansetron 4 MG tablet Commonly known as:  ZOFRAN Take 1 tablet (4 mg total) by mouth every 8 (eight) hours as needed for nausea or vomiting.   pantoprazole 40 MG tablet Commonly known as:  PROTONIX Take 1 tablet (40 mg total) by mouth daily. Reported on 10/19/2015   pravastatin 40 MG tablet Commonly known as:  PRAVACHOL Take 40 mg by mouth every evening.   prochlorperazine 5 MG tablet Commonly known as:  COMPAZINE Take 1 tablet (5 mg total) by mouth every 6 (six) hours as needed for nausea or vomiting.     promethazine 25 MG suppository Commonly known as:  PHENERGAN Place 1 suppository (25 mg total) rectally every 6 (six) hours as needed for nausea or vomiting.   vitamin C 1000 MG tablet Take 1,000 mg by mouth daily.   VITAMIN D PO Take 1 capsule by mouth daily.        PHYSICAL EXAMINATION  Oncology Vitals 11/13/2016 11/04/2016  Height 165 cm 166 cm  Weight 74.844 kg 80.332 kg  Weight (lbs) 165 lbs 177 lbs 2 oz  BMI (kg/m2) 27.46 kg/m2 29.02 kg/m2  Temp - 97.7  Pulse - 66  Resp - 18  SpO2 98 98  BSA (m2) 1.85 m2 1.93 m2   BP Readings from Last 2 Encounters:  11/04/16 (!) 165/68  11/01/16 145/60    Physical Exam  Constitutional: She is oriented to person, place, and time. Vital signs are normal. She appears malnourished. She appears unhealthy. She appears cachectic. She appears distressed.  HENT:  Head: Normocephalic and atraumatic.  Mouth/Throat: Oropharynx is clear and moist.  Eyes: Conjunctivae and EOM are normal. Pupils are equal, round, and reactive to light. Right eye exhibits no discharge. Left eye exhibits no discharge. No scleral icterus.  Neck: Normal range of motion. Neck supple. No JVD present. No tracheal deviation present. No thyromegaly present.  Cardiovascular: Normal rate, regular rhythm, normal heart sounds and intact distal pulses.   Pulmonary/Chest: Effort normal and breath sounds normal. No respiratory distress. She has no wheezes. She has no rales. She exhibits no tenderness.  Abdominal: Soft. Bowel sounds are normal. She exhibits no distension and no mass. There is tenderness. There is guarding. There is no rebound.  Musculoskeletal: Normal range of motion. She exhibits no edema or tenderness.  Lymphadenopathy:    She has no cervical adenopathy.  Neurological: She is alert and oriented to person, place, and time. Gait normal.  Skin: Skin is warm and dry. No rash noted. No erythema. No pallor.  Psychiatric: Affect normal.  Nursing note and vitals  reviewed.   LABORATORY DATA:. Appointment on 11/13/2016  Component Date Value Ref Range Status  . WBC 11/13/2016 4.4  3.9 - 10.3 10e3/uL Final  . NEUT# 11/13/2016 3.8  1.5 - 6.5 10e3/uL Final  . HGB 11/13/2016 13.4  11.6 - 15.9 g/dL Final  . HCT 11/13/2016 41.5  34.8 - 46.6 % Final  . Platelets 11/13/2016 214  145 - 400 10e3/uL Final  . MCV 11/13/2016 93.5  79.5 - 101.0 fL Final  . MCH 11/13/2016 30.2  25.1 - 34.0 pg Final  . MCHC 11/13/2016 32.3  31.5 - 36.0 g/dL Final  . RBC 11/13/2016 4.44  3.70 - 5.45 10e6/uL Final  . RDW 11/13/2016 16.2* 11.2 - 14.5 %  Final  . lymph# 11/13/2016 0.5* 0.9 - 3.3 10e3/uL Final  . MONO# 11/13/2016 0.1  0.1 - 0.9 10e3/uL Final  . Eosinophils Absolute 11/13/2016 0.0  0.0 - 0.5 10e3/uL Final  . Basophils Absolute 11/13/2016 0.0  0.0 - 0.1 10e3/uL Final  . NEUT% 11/13/2016 86.0* 38.4 - 76.8 % Final  . LYMPH% 11/13/2016 11.0* 14.0 - 49.7 % Final  . MONO% 11/13/2016 2.3  0.0 - 14.0 % Final  . EOS% 11/13/2016 0.0  0.0 - 7.0 % Final  . BASO% 11/13/2016 0.7  0.0 - 2.0 % Final  . Sodium 11/13/2016 142  136 - 145 mEq/L Final  . Potassium 11/13/2016 4.1  3.5 - 5.1 mEq/L Final  . Chloride 11/13/2016 109  98 - 109 mEq/L Final  . CO2 11/13/2016 22  22 - 29 mEq/L Final  . Glucose 11/13/2016 138  70 - 140 mg/dl Final  . BUN 11/13/2016 9.9  7.0 - 26.0 mg/dL Final  . Creatinine 11/13/2016 0.9  0.6 - 1.1 mg/dL Final  . Total Bilirubin 11/13/2016 0.51  0.20 - 1.20 mg/dL Final  . Alkaline Phosphatase 11/13/2016 111  40 - 150 U/L Final  . AST 11/13/2016 16  5 - 34 U/L Final  . ALT 11/13/2016 11  0 - 55 U/L Final  . Total Protein 11/13/2016 7.4  6.4 - 8.3 g/dL Final  . Albumin 11/13/2016 4.4  3.5 - 5.0 g/dL Final  . Calcium 11/13/2016 10.0  8.4 - 10.4 mg/dL Final  . Anion Gap 11/13/2016 11  3 - 11 mEq/L Final  . EGFR 11/13/2016 71* >90 ml/min/1.73 m2 Final    RADIOGRAPHIC STUDIES: No results found.  ASSESSMENT/PLAN:    Multiple myeloma (HCC) Patient has been  holding the Revlimid oral chemotherapy and the Velcade chemotherapy injections for the past 3 weeks due to her complaint of abdominal pain and nausea/vomiting.  She was scheduled to initiate the Revlimid again this evening; and was also planning to receive a Velcade injection today at the cancer Center-but she arrived to the [facility].  Once again with nausea, vomiting, and significant left sided abdominal discomfort.  See further notes for details.  Patient scheduled to return to the [facility] for labs, visit, and her next cycle of chemotherapy on 11/20/2016.  Abdominal pain Patient has been holding the Revlimid oral chemotherapy and the Velcade chemotherapy injections for the past 3 weeks due to her complaint of abdominal pain and nausea/vomiting.  She was scheduled to initiate the Revlimid again this evening; and was also planning to receive a Velcade injection today at the cancer Center-but she arrived to the [facility].  Once again with nausea, vomiting, and significant left sided abdominal discomfort.    Patient was seen in the emergency department a few weeks ago with the same complaints; and CT at that time revealed questionable renal cyst bilaterally-but no other acute findings.  Patient was just seen by [facility] this past Monday; and chart note from [facility] stated that the bilateral renal cyst.  Most likely had absolutely nothing to do with patient's abdominal discomfort.  Of note-patient has a history of some mild urinary retention; but patient states that the urinary retention has greatly improved.  Patient states she's able to drink; but has had little appetite recently.  She states that her abdominal discomfort has continued for the past 3 weeks; despite taking pain medication.  She states that the nausea and vomiting had been under control until earlier this morning.  She  has been vomiting intermittently all day.  Exam today reveals tenderness to the entire  left-sided patient's abdomen that appears to radiate towards her left flank as well.  Patient denies any UTI symptoms at this point.  She also denies any recent fevers or chills.  Dr. Benay Spice in to examine the patient as well; he feels that patient's abdominal discomfort  most likely has nothing to do with patient's multiple myeloma.  In fact, Dr. Benay Spice states the patient's multiple myeloma is much better controlled at this point.  Review of patient's past records-indicated.  The patient has had some gastric irritation in the past.  This provider was able to obtain an appointment with [place] with Edward Jolly, [place] on Monday 11/17/2016 at 10:30 in the morning.  However, patient appears in much too uncomfortable to wait through the weekend with this level of discomfort.  Labs obtained today reveal no acute findings.  Vital signs were essentially stable; and patient was afebrile.  Of note-patient states she only has to of her pain pills left and will need a refill before she goes back home.  Due to patient's abdominal discomfort-patient will be transported to the emergency department for further evaluation and management.  Brief history.  Report were called to the emergency department charge nurse; prior to the patient being transported to the emergency department via wheelchair.  Per the cancer Center nurse.  CODE STATUS: Patient should be considered a full code; since there are no advanced directives in the patient's chart.    Patient stated understanding of all instructions; and was in agreement with this plan of care. The patient knows to call the clinic with any problems, questions or concerns.   Total time spent with patient was 40 minutes;  with greater than 75 percent of that time spent in face to face counseling regarding patient's symptoms,  and coordination of care and follow up.  Disclaimer:This dictation was prepared with Dragon/digital dictation along with [employer]. Any transcriptional errors that result from this process are unintentional.  Drue Second, NP 11/13/2016  This was a shared visit with Drue Second. Ms. Lenart was interviewed and examined. The etiology of the abdominal pain is unclear. The pain is unlikely related to myeloma. The myeloma markers have improved significantly and her anemia has resolved.  The pain has persisted and required a dose of IV morphine in the office today. She will be admitted for further evaluation.  Julieanne Manson, M.D.

## 2016-11-13 NOTE — Progress Notes (Signed)
Pt transferred to ED for additional work up and possible admission. IV saline locked. Report called to Altha Harm, RN per Selena Lesser, NP.  Transported via w/c with pt's sister in attendance.

## 2016-11-13 NOTE — Assessment & Plan Note (Signed)
Patient has been holding the Revlimid oral chemotherapy and the Velcade chemotherapy injections for the past 3 weeks due to her complaint of abdominal pain and nausea/vomiting.  She was scheduled to initiate the Revlimid again this evening; and was also planning to receive a Velcade injection today at the cancer Center-but she arrived to the [facility].  Once again with nausea, vomiting, and significant left sided abdominal discomfort.    Patient was seen in the emergency department a few weeks ago with the same complaints; and CT at that time revealed questionable renal cyst bilaterally-but no other acute findings.  Patient was just seen by [facility] this past Monday; and chart note from [facility] stated that the bilateral renal cyst.  Most likely had absolutely nothing to do with patient's abdominal discomfort.  Of note-patient has a history of some mild urinary retention; but patient states that the urinary retention has greatly improved.  Patient states she's able to drink; but has had little appetite recently.  She states that her abdominal discomfort has continued for the past 3 weeks; despite taking pain medication.  She states that the nausea and vomiting had been under control until earlier this morning.  She has been vomiting intermittently all day.  Exam today reveals tenderness to the entire left-sided patient's abdomen that appears to radiate towards her left flank as well.  Patient denies any UTI symptoms at this point.  She also denies any recent fevers or chills.  Dr. Benay Spice in to examine the patient as well; he feels that patient's abdominal discomfort  most likely has nothing to do with patient's multiple myeloma.  In fact, Dr. Benay Spice states the patient's multiple myeloma is much better controlled at this point.  Review of patient's past records-indicated.  The patient has had some gastric irritation in the past.  This provider was able to obtain an appointment with  [place] with Edward Jolly, [place] on Monday 11/17/2016 at 10:30 in the morning.  However, patient appears in much too uncomfortable to wait through the weekend with this level of discomfort.  Labs obtained today reveal no acute findings.  Vital signs were essentially stable; and patient was afebrile.  Of note-patient states she only has to of her pain pills left and will need a refill before she goes back home.  Due to patient's abdominal discomfort-patient will be transported to the emergency department for further evaluation and management.  Brief history.  Report were called to the emergency department charge nurse; prior to the patient being transported to the emergency department via wheelchair.  Per the cancer Center nurse.  CODE STATUS: Patient should be considered a full code; since there are no advanced directives in the patient's chart.

## 2016-11-13 NOTE — Progress Notes (Signed)
Oncology Nurse Navigator Documentation  Oncology Nurse Navigator Flowsheets 11/13/2016  Navigator Location [facility]  Patient Visit Type MedOnc  Barriers/Navigation Needs Coordination of Care  Interventions Coordination of Care--scheduled appointment w/[facility] to see PA on 11/17/16 at 10:30  Requested by Dr. Benay Spice for [facility] to see her tomorrow if possible for abdominal pain, N/V. Saw urology after CT scan and was told pain is not from this. Dr. Benson Norway did upper endo in 2017. Called office and was told he was hospital physician that encounter and she is not established there. Noted colonoscopy by Dr. Hilarie Fredrickson in 2006. Called [facility] and spoke with Andrew Au and provided appointment for 11/17/16 at 10:30 with Amy Willard. Patient notified.

## 2016-11-13 NOTE — Assessment & Plan Note (Signed)
Patient has been holding the Revlimid oral chemotherapy and the Velcade chemotherapy injections for the past 3 weeks due to her complaint of abdominal pain and nausea/vomiting.  She was scheduled to initiate the Revlimid again this evening; and was also planning to receive a Velcade injection today at the cancer Center-but she arrived to the [facility].  Once again with nausea, vomiting, and significant left sided abdominal discomfort.  See further notes for details.  Patient scheduled to return to the [facility] for labs, visit, and her next cycle of chemotherapy on 11/20/2016.

## 2016-11-13 NOTE — ED Notes (Signed)
2nd administration of dilaudid 0.5mg  IVP given for total of 1.0mg . EDP present and aware of medication schedule.

## 2016-11-13 NOTE — Discharge Instructions (Signed)
Your doctor's visit today indicates that you have a scheduled appointment with [facility] at 10:30 AM on Monday. Call your family doctor's office tomorrow morning to make sure this appointment is still scheduled and all appropriate arrangements have been made.  Continue to take Protonix (pantoprazole) daily. Use Phenergan (promethazine) as needed for nausea and vomiting. You may take 1-2 Vicodin (hydrocodone-acetaminophen) pills every 4 hours for pain control. If you're taking the Vicodin, be sure to take Colace (docusate) twice daily to avoid constipation.

## 2016-11-13 NOTE — Telephone Encounter (Signed)
Called patient, no answer or machine to leave message.  Provider and [facility] aware of patient call.  Processing plan for patient upn arrival today due to n/v symptoms.

## 2016-11-13 NOTE — ED Notes (Signed)
Pt being sent by [facility] d/t intractable vomiting and LLQ pain.  24m Zofran and 23mMorphine given at Ctr.  All lab work(CBC and CMP) WDL.  Hx of multiple myeloma and last chemo x 3 weeks ago.  Pt has a GI appointment on 1/29.

## 2016-11-13 NOTE — Telephone Encounter (Signed)
Voicemail: "I'm sick and need to be seen now." Returned call. "I've been throwing up brown liquid the past two hours.  I guess the total amount could be two cups.  I took Zofran about 9:30 and compazine about 10:45 am.  I never picked up the suppositories the ER ordered because it got better.  I've had pain two weeks or more in the left lower abdomen that goes around to my back.  Pain eases a little with Norco 5-325 mg tablets.  Last BM was yesterday and normal BM.  I ate grits this morning.  Ate dinner about 5:00 yesterday with cabbage, cornbread and [place] wing.  I'm coming in and will be there in 15 to 20 minutes."

## 2016-11-13 NOTE — ED Provider Notes (Signed)
[facility] DEPT Provider Note   CSN: [id number] Arrival date & time: 11/13/16  1440     History   Chief Complaint Chief Complaint  Patient presents with  . Emesis  . Cancer    HPI Abigail [name] is a 71 y.o. female.  HPI Patient states she got vomiting illness about 2 weeks ago. She reports that at that time after vomiting for several day she also developed left lower quadrant pain. Is sharp in quality and radiates somewhat to her back. She reports the symptoms were persisting but then she got pain and nausea medications and they actually improved for about a week. She reports that he just came back again yesterday. She started vomiting. She reports she continues to take and water. She denies any fever. She reports that the left lower abdominal pain came back with the vomiting. No diarrhea. No fever. No chest pain or shortness of breath. No cough. No radiation of pain into her legs. Past Medical History:  Diagnosis Date  . Anemia 07/2015  . Bilateral renal artery stenosis ([place]) 1999   s/p stenting  . Coronary artery disease 1999  . Depression   . Diverticulosis   . Duodenitis   . Gastric erosions   . Gastric ulcer   . GERD (gastroesophageal reflux disease)   . Heart murmur   . History of blood transfusion ~ 03/2016   "low HgB; practically nonexistent"  . Hyperlipidemia   . Hypertension   . MI (myocardial infarction) 1999  . Multiple myeloma ([place])   . Retinal hemorrhage, right eye   . Schatzki's ring   . Tubular adenoma of colon     Patient Active Problem List   Diagnosis Date Noted  . Abdominal pain 11/05/2016  . Multiple myeloma ([place]) 06/18/2016  . Malnutrition of moderate degree 06/14/2016  . 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 Exertional dyspnea 11/01/2015  . Coronary atherosclerosis of native coronary artery 11/01/2015  . Shortness of breath 11/01/2015  . Chest pain 12/31/2011    Past Surgical History:  Procedure  Laterality Date  . APPENDECTOMY    . BACK SURGERY    . CATARACT EXTRACTION W/ INTRAOCULAR LENS IMPLANT Right 2014  . CORONARY ANGIOPLASTY WITH STENT PLACEMENT  1999  . ESOPHAGOGASTRODUODENOSCOPY N/A 11/03/2015   Procedure: ESOPHAGOGASTRODUODENOSCOPY (EGD);  Surgeon: Carol Ada, MD;  Location: [facility] ENDOSCOPY;  Service: Endoscopy;  Laterality: N/A;  . LUMBAR [place]    . RENAL ARTERY STENT  1999   bil RAS so ? bil vs unilateral stents  . RETINAL LASER PROCEDURE Right 2012   "bleeding"  . TONSILLECTOMY    . TOTAL ABDOMINAL HYSTERECTOMY      OB History    No data available       Home Medications    Prior to Admission medications   Medication Sig Start Date End Date Taking? Authorizing Provider  acyclovir (ZOVIRAX) 400 MG tablet TAKE 1 TABLET (400 MG TOTAL) BY MOUTH 2 (TWO) TIMES DAILY. 08/29/16  Yes Ladell Pier, MD  amLODipine (NORVASC) 10 MG tablet Take 10 mg by mouth at bedtime.  01/03/16  Yes Historical Provider, MD  Ascorbic Acid (VITAMIN C) 1000 MG tablet Take 1,000 mg by mouth daily.    Yes Historical Provider, MD  aspirin EC 81 MG tablet Take 81 mg by mouth daily.   Yes Historical Provider, MD  calcium carbonate (OS-CAL - DOSED IN MG OF ELEMENTAL CALCIUM) 1250 (500 Ca) MG tablet Take 1 tablet (  500 mg of elemental calcium total) by mouth daily with breakfast. 06/19/16  Yes [name], MD  Cholecalciferol (VITAMIN D PO) Take 1 capsule by mouth daily.   Yes Historical Provider, MD  dexamethasone (DECADRON) 4 MG tablet Decadron 40 mg once a week on day of Velcade injection Patient taking differently: Take 20 mg by mouth once a week. Decadron 40 mg once a week on day of Velcade injection  07/24/16  Yes Owens Shark, NP  FLUoxetine (PROZAC) 10 MG capsule Take 10 mg by mouth at bedtime.    Yes Historical Provider, MD  HYDROcodone-acetaminophen (NORCO/VICODIN) 5-325 MG tablet Take 1-2 tablets by mouth every 6 (six) hours as needed for moderate pain. 11/04/16  Yes Susanne Borders,  NP  ibuprofen (ADVIL,MOTRIN) 200 MG tablet Take 400 mg by mouth every 6 (six) hours as needed.   Yes Historical Provider, MD  isosorbide mononitrate (IMDUR) 60 MG 24 hr tablet Take 1 tablet (60 mg total) by mouth daily. 06/19/16  Yes [name], MD  KLOR-CON M10 10 MEQ tablet Take 10 mEq by mouth daily.  06/13/16  Yes Historical Provider, MD  lenalidomide (REVLIMID) 15 MG capsule Take 1 capsule (15 mg total) by mouth daily. For 14 days, then 14 days off. 11/07/16  Yes Ladell Pier, MD  ondansetron (ZOFRAN) 4 MG tablet Take 1 tablet (4 mg total) by mouth every 8 (eight) hours as needed for nausea or vomiting. 10/27/16  Yes Nicole Pisciotta, PA-C  pantoprazole (PROTONIX) 40 MG tablet Take 1 tablet (40 mg total) by mouth daily. Reported on 10/19/2015 01/14/16  Yes [name], MD  pravastatin (PRAVACHOL) 40 MG tablet Take 40 mg by mouth every evening. 06/03/15  Yes Historical Provider, MD  prochlorperazine (COMPAZINE) 5 MG tablet Take 1 tablet (5 mg total) by mouth every 6 (six) hours as needed for nausea or vomiting. 11/03/16  Yes Ladell Pier, MD  docusate sodium (COLACE) 100 MG capsule Take 1 capsule (100 mg total) by mouth every 12 (twelve) hours. 11/13/16   Charlesetta Shanks, MD  HYDROcodone-acetaminophen (NORCO/VICODIN) 5-325 MG tablet Take 1-2 tablets by mouth every 4 (four) hours as needed for moderate pain or severe pain. 11/13/16   Charlesetta Shanks, MD  promethazine (PHENERGAN) 25 MG suppository Place 1 suppository (25 mg total) rectally every 6 (six) hours as needed for nausea or vomiting. Patient not taking: Reported on 11/04/2016 10/27/16   Monico Blitz, PA-C    Family History Family History  Problem Relation Age of Onset  . Colon cancer Neg Hx     Social History Social History  Substance Use Topics  . Smoking status: Current Every Day Smoker    Packs/day: 0.50    Years: 48.00    Types: Cigarettes  . Smokeless tobacco: Never Used  . Alcohol use No     Allergies   Sulfa  antibiotics   Review of Systems Review of Systems 10 Systems reviewed and are negative for acute change except as noted in the HPI.   Physical Exam Updated Vital Signs BP (P) 137/85 (BP Location: Right Arm)   Pulse (P) 80   Resp (P) 16   Ht _0  (1.651 m)   Wt 165 lb (74.8 kg)   SpO2 98%   BMI 27.46 kg/m   Physical Exam  Constitutional: She is oriented to person, place, and time.  The patient appears slightly pale and fatigued. She is alert and nontoxic. No respiratory distress.  HENT:  Head: Normocephalic and atraumatic.  Mouth/Throat: Oropharynx is clear and moist.  Eyes: Conjunctivae are normal. Pupils are equal, round, and reactive to light.  Neck: Neck supple.  Cardiovascular: Normal rate, regular rhythm, normal heart sounds and intact distal pulses.   Pulmonary/Chest: Effort normal and breath sounds normal.  Abdominal: Soft. Bowel sounds are normal. She exhibits no distension. There is no guarding.  Mild left lower quadrant tenderness but no guarding. Soft to palpation.  Musculoskeletal: Normal range of motion. She exhibits no edema or tenderness.  Neurological: She is alert and oriented to person, place, and time. She exhibits normal muscle tone. Coordination normal.  Skin: Skin is warm and dry. There is pallor.  Psychiatric: She has a normal mood and affect.     ED Treatments / Results  Labs (all labs ordered are listed, but only abnormal results are displayed) Labs Reviewed  URINALYSIS, ROUTINE W REFLEX MICROSCOPIC - Abnormal; Notable for the following:       Result Value   Ketones, ur 5 (*)    Protein, ur 100 (*)    Squamous Epithelial / LPF 0-5 (*)    All other components within normal limits  I-STAT CG4 LACTIC ACID, ED    EKG  EKG Interpretation None       Radiology No results found.  Procedures Procedures (including critical care time)  Medications Ordered in ED Medications  sodium chloride 0.9 % bolus 1,000 mL (1,000 mLs Intravenous New  Bag/Given 11/13/16 1627)  HYDROmorphone (DILAUDID) injection 1 mg (0.5 mg Intravenous Given 11/13/16 1544)  ondansetron (ZOFRAN) injection 4 mg (4 mg Intravenous Given 11/13/16 1543)     Initial Impression / Assessment and Plan / ED Course  I have reviewed the triage vital signs and the nursing notes.  Pertinent labs & imaging results that were available during my care of the patient were reviewed by me and considered in my medical decision making (see chart for details).      Final Clinical Impressions(s) / ED Diagnoses   Final diagnoses:  Non-intractable vomiting with nausea, unspecified vomiting type  Left lower quadrant pain   Patient reports recurrence of nausea and vomiting with left lower abdominal pain that started yesterday. This problem actually has been occurring for over 2 weeks. Clinically, the patient is nontoxic and on examination has a nonsurgical abdominal examination. Labs are stable without indication of leukocytosis or dehydration. Urinalysis does not show UTI. Patient did have a CT scan done 2 weeks ago without identified acute etiology for this finding. Patient was treated with pain medications, nausea medication and fluids in the emergency department. She reported feeling much improved. Patient's PCP has made arrangements for follow-up with GI on Monday. Patient reports that the Phenergan has been very effective in controlling nausea for her. At this time, I do not think that a repeat CT scan is indicated. We will try another trial of pain control and nausea control as the patient is not significantly dehydrated from episodes of emesis. She also is not exhibiting intractable pain in the emergency department. He has counseled on continuing twice daily Colace as long as she is taking narcotic pain medications. New Prescriptions New Prescriptions   DOCUSATE SODIUM (COLACE) 100 MG CAPSULE    Take 1 capsule (100 mg total) by mouth every 12 (twelve) hours.    HYDROCODONE-ACETAMINOPHEN (NORCO/VICODIN) 5-325 MG TABLET    Take 1-2 tablets by mouth every 4 (four) hours as needed for moderate pain or severe pain.     Charlesetta Shanks, MD 11/13/16 [phone]

## 2016-11-13 NOTE — ED Notes (Signed)
Bed: WA09 Expected date:  Expected time:  Means of arrival:  Comments: [place]

## 2016-11-14 ENCOUNTER — Emergency Department (HOSPITAL_COMMUNITY)
Admission: EM | Admit: 2016-11-14 | Discharge: 2016-11-15 | Disposition: A | Discharge status: Home / Self Care | Payer: Medicare HMO | Source: Home / Self Care | Attending: Emergency Medicine | Admitting: Emergency Medicine

## 2016-11-14 ENCOUNTER — Encounter (HOSPITAL_COMMUNITY): Payer: Self-pay | Admitting: *Deleted

## 2016-11-14 ENCOUNTER — Telehealth: Payer: Self-pay | Admitting: Nurse Practitioner

## 2016-11-14 ENCOUNTER — Emergency Department (HOSPITAL_COMMUNITY): Payer: Medicare HMO

## 2016-11-14 DIAGNOSIS — I2510 Atherosclerotic heart disease of native coronary artery without angina pectoris: Secondary | ICD-10-CM | POA: Insufficient documentation

## 2016-11-14 DIAGNOSIS — Z955 Presence of coronary angioplasty implant and graft: Secondary | ICD-10-CM

## 2016-11-14 DIAGNOSIS — K5900 Constipation, unspecified: Secondary | ICD-10-CM | POA: Insufficient documentation

## 2016-11-14 DIAGNOSIS — R1032 Left lower quadrant pain: Secondary | ICD-10-CM

## 2016-11-14 DIAGNOSIS — I252 Old myocardial infarction: Secondary | ICD-10-CM

## 2016-11-14 DIAGNOSIS — K210 Gastro-esophageal reflux disease with esophagitis: Secondary | ICD-10-CM | POA: Diagnosis not present

## 2016-11-14 DIAGNOSIS — Z7982 Long term (current) use of aspirin: Secondary | ICD-10-CM | POA: Insufficient documentation

## 2016-11-14 DIAGNOSIS — F17210 Nicotine dependence, cigarettes, uncomplicated: Secondary | ICD-10-CM | POA: Insufficient documentation

## 2016-11-14 DIAGNOSIS — R112 Nausea with vomiting, unspecified: Secondary | ICD-10-CM | POA: Diagnosis not present

## 2016-11-14 DIAGNOSIS — I10 Essential (primary) hypertension: Secondary | ICD-10-CM | POA: Insufficient documentation

## 2016-11-14 LAB — COMPREHENSIVE METABOLIC PANEL
ALK PHOS: 70 U/L (ref 38–126)
ALT: 11 U/L — AB (ref 14–54)
AST: 16 U/L (ref 15–41)
Albumin: 4 g/dL (ref 3.5–5.0)
Anion gap: 9 (ref 5–15)
BILIRUBIN TOTAL: 0.7 mg/dL (ref 0.3–1.2)
BUN: 11 mg/dL (ref 6–20)
CALCIUM: 8.9 mg/dL (ref 8.9–10.3)
CO2: 22 mmol/L (ref 22–32)
CREATININE: 0.78 mg/dL (ref 0.44–1.00)
Chloride: 110 mmol/L (ref 101–111)
Glucose, Bld: 122 mg/dL — ABNORMAL HIGH (ref 65–99)
Potassium: 3.8 mmol/L (ref 3.5–5.1)
Sodium: 141 mmol/L (ref 135–145)
Total Protein: 6.2 g/dL — ABNORMAL LOW (ref 6.5–8.1)

## 2016-11-14 LAB — CBC
HCT: 37.8 % (ref 36.0–46.0)
Hemoglobin: 12 g/dL (ref 12.0–15.0)
MCH: 29.3 pg (ref 26.0–34.0)
MCHC: 31.7 g/dL (ref 30.0–36.0)
MCV: 92.2 fL (ref 78.0–100.0)
PLATELETS: 178 10*3/uL (ref 150–400)
RBC: 4.1 MIL/uL (ref 3.87–5.11)
RDW: 16.3 % — ABNORMAL HIGH (ref 11.5–15.5)
WBC: 7 10*3/uL (ref 4.0–10.5)

## 2016-11-14 LAB — LIPASE, BLOOD: Lipase: 23 U/L (ref 11–51)

## 2016-11-14 MED ORDER — SODIUM CHLORIDE 0.9 % IV BOLUS (SEPSIS)
1000.0000 mL | Freq: Once | INTRAVENOUS | Status: AC
  Administered 2016-11-14: 1000 mL via INTRAVENOUS

## 2016-11-14 MED ORDER — ONDANSETRON HCL 4 MG/2ML IJ SOLN
4.0000 mg | Freq: Once | INTRAMUSCULAR | Status: AC
  Administered 2016-11-14: 4 mg via INTRAVENOUS
  Filled 2016-11-14: qty 2

## 2016-11-14 MED ORDER — HYDROMORPHONE HCL 1 MG/ML IJ SOLN
0.5000 mg | Freq: Once | INTRAMUSCULAR | Status: AC
  Administered 2016-11-14: 0.5 mg via INTRAVENOUS
  Filled 2016-11-14: qty 1

## 2016-11-14 NOTE — ED Provider Notes (Signed)
[facility] DEPT Provider Note   CSN: [id number] Arrival date & time: 11/14/16  1845  By signing my name below, I, Abigail [name], attest that this documentation has been prepared under the direction and in the presence of Abigail Enrika Aguado, PA-C. Electronically Signed: Dora [name], Scribe. 11/14/2016. 9:57 PM.  History   Chief Complaint Chief Complaint  Patient presents with  . Emesis  . Weakness    The history is provided by the patient. No language interpreter was used.     HPI Comments: Abigail [name] is a 71 y.o. female who presents to the Emergency Department complaining of intermittent nausea and vomiting for about three weeks. She reports associated non-radiating LLQ abdominal pain and generalized weakness. She has been seen here three times this month prior to today for the same and has been given fluids, Zofran, and pain medicine with transient relief of her symptoms. She notes she has Compazine and hydrocodone at home which have not provided relief of her symptoms today. Pt reports she was vomiting persistently PTA today and notes her symptoms have not improved since onset. She notes some chronic constipation and her last normal BM was 2 days ago. She has been passing flatus and urinating normally. Pt is undergoing chemotherapy for multiple myeloma and her last session was ~ 2 weeks ago. Pt notes a pertinent PSHx of abdominal hysterectomy and appendectomy. No h/o other abdominal surgeries or bowel obstruction. She denies diarrhea or any other associated symptoms.  Past Medical History:  Diagnosis Date  . Anemia 07/2015  . Bilateral renal artery stenosis ([place]) 1999   s/p stenting  . Coronary artery disease 1999  . Depression   . Diverticulosis   . Duodenitis   . Gastric erosions   . Gastric ulcer   . GERD (gastroesophageal reflux disease)   . Heart murmur   . History of blood transfusion ~ 03/2016   "low HgB; practically nonexistent"  . Hyperlipidemia   . Hypertension   .  MI (myocardial infarction) 1999  . Multiple myeloma ([place])   . Retinal hemorrhage, right eye   . Schatzki's ring   . Tubular adenoma of colon     Patient Active Problem List   Diagnosis Date Noted  . Abdominal pain 11/05/2016  . Multiple myeloma ([place]) 06/18/2016  . Malnutrition of moderate degree 06/14/2016  . 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 Exertional dyspnea 11/01/2015  . Coronary atherosclerosis of native coronary artery 11/01/2015  . Shortness of breath 11/01/2015  . Chest pain 12/31/2011    Past Surgical History:  Procedure Laterality Date  . APPENDECTOMY    . BACK SURGERY    . CATARACT EXTRACTION W/ INTRAOCULAR LENS IMPLANT Right 2014  . CORONARY ANGIOPLASTY WITH STENT PLACEMENT  1999  . ESOPHAGOGASTRODUODENOSCOPY N/A 11/03/2015   Procedure: ESOPHAGOGASTRODUODENOSCOPY (EGD);  Surgeon: Carol Ada, MD;  Location: [facility] ENDOSCOPY;  Service: Endoscopy;  Laterality: N/A;  . LUMBAR [place]    . RENAL ARTERY STENT  1999   bil RAS so ? bil vs unilateral stents  . RETINAL LASER PROCEDURE Right 2012   "bleeding"  . TONSILLECTOMY    . TOTAL ABDOMINAL HYSTERECTOMY      OB History    No data available       Home Medications    Prior to Admission medications   Medication Sig Start Date End Date Taking? Authorizing Provider  acyclovir (ZOVIRAX) 400 MG tablet TAKE 1 TABLET (400 MG TOTAL) BY MOUTH 2 (TWO) TIMES  DAILY. 08/29/16  Yes Ladell Pier, MD  amLODipine (NORVASC) 10 MG tablet Take 10 mg by mouth at bedtime.  01/03/16  Yes Historical Provider, MD  Ascorbic Acid (VITAMIN C) 1000 MG tablet Take 1,000 mg by mouth daily.    Yes Historical Provider, MD  aspirin EC 81 MG tablet Take 81 mg by mouth daily.   Yes Historical Provider, MD  calcium carbonate (OS-CAL - DOSED IN MG OF ELEMENTAL CALCIUM) 1250 (500 Ca) MG tablet Take 1 tablet (500 mg of elemental calcium total) by mouth daily with breakfast. 06/19/16  Yes [name], MD    Cholecalciferol (VITAMIN D PO) Take 1 capsule by mouth daily.   Yes Historical Provider, MD  dexamethasone (DECADRON) 4 MG tablet Decadron 40 mg once a week on day of Velcade injection Patient taking differently: Take 20 mg by mouth once a week. Decadron 40 mg once a week on day of Velcade injection  07/24/16  Yes Owens Shark, NP  docusate sodium (COLACE) 100 MG capsule Take 1 capsule (100 mg total) by mouth every 12 (twelve) hours. 11/13/16  Yes Charlesetta Shanks, MD  FLUoxetine (PROZAC) 10 MG capsule Take 10 mg by mouth at bedtime.    Yes Historical Provider, MD  HYDROcodone-acetaminophen (NORCO/VICODIN) 5-325 MG tablet Take 1-2 tablets by mouth every 4 (four) hours as needed for moderate pain or severe pain. 11/13/16  Yes Charlesetta Shanks, MD  ibuprofen (ADVIL,MOTRIN) 200 MG tablet Take 400 mg by mouth every 6 (six) hours as needed.   Yes Historical Provider, MD  isosorbide mononitrate (IMDUR) 60 MG 24 hr tablet Take 1 tablet (60 mg total) by mouth daily. 06/19/16  Yes [name], MD  KLOR-CON M10 10 MEQ tablet Take 10 mEq by mouth daily.  06/13/16  Yes Historical Provider, MD  lenalidomide (REVLIMID) 15 MG capsule Take 1 capsule (15 mg total) by mouth daily. For 14 days, then 14 days off. 11/07/16  Yes Ladell Pier, MD  ondansetron (ZOFRAN) 4 MG tablet Take 1 tablet (4 mg total) by mouth every 8 (eight) hours as needed for nausea or vomiting. 10/27/16  Yes Nicole Pisciotta, PA-C  pantoprazole (PROTONIX) 40 MG tablet Take 1 tablet (40 mg total) by mouth daily. Reported on 10/19/2015 01/14/16  Yes [name], MD  pravastatin (PRAVACHOL) 40 MG tablet Take 40 mg by mouth every evening. 06/03/15  Yes Historical Provider, MD  prochlorperazine (COMPAZINE) 5 MG tablet Take 1 tablet (5 mg total) by mouth every 6 (six) hours as needed for nausea or vomiting. 11/03/16  Yes Ladell Pier, MD  HYDROcodone-acetaminophen (NORCO/VICODIN) 5-325 MG tablet Take 1-2 tablets by mouth every 6 (six) hours as needed for  moderate pain. Patient not taking: Reported on 11/14/2016 11/04/16   Susanne Borders, NP  promethazine (PHENERGAN) 25 MG suppository Place 1 suppository (25 mg total) rectally every 6 (six) hours as needed for nausea or vomiting. Patient not taking: Reported on 11/04/2016 10/27/16   Monico Blitz, PA-C    Family History Family History  Problem Relation Age of Onset  . Colon cancer Neg Hx     Social History Social History  Substance Use Topics  . Smoking status: Current Every Day Smoker    Packs/day: 0.50    Years: 48.00    Types: Cigarettes  . Smokeless tobacco: Never Used  . Alcohol use No     Allergies   Sulfa antibiotics   Review of Systems Review of Systems  Constitutional: Negative for fever.  HENT:  Negative for rhinorrhea and sore throat.   Eyes: Negative for redness.  Respiratory: Negative for cough.   Cardiovascular: Negative for chest pain.  Gastrointestinal: Positive for abdominal pain, constipation (chronic), nausea and vomiting. Negative for diarrhea.  Genitourinary: Negative for dysuria.  Musculoskeletal: Negative for myalgias.  Skin: Negative for color change and rash.  Neurological: Positive for weakness. Negative for headaches.  Hematological: Negative for adenopathy.     Physical Exam Updated Vital Signs BP 136/68   Pulse (!) 45   Resp 18   SpO2 98%   Physical Exam  Constitutional: She appears well-developed and well-nourished.  HENT:  Head: Normocephalic and atraumatic.  Eyes: Conjunctivae are normal. Right eye exhibits no discharge. Left eye exhibits no discharge.  Neck: Normal range of motion. Neck supple.  Cardiovascular: Normal rate, regular rhythm and normal heart sounds.   Pulmonary/Chest: Effort normal and breath sounds normal.  Abdominal: Soft. She exhibits no distension and no mass. There is tenderness (moderate) in the suprapubic area and left lower quadrant. There is no guarding.  Neurological: She is alert.  Skin: Skin is warm  and dry.  Psychiatric: She has a normal mood and affect.  Nursing note and vitals reviewed.    ED Treatments / Results  Labs (all labs ordered are listed, but only abnormal results are displayed) Labs Reviewed  COMPREHENSIVE METABOLIC PANEL - Abnormal; Notable for the following:       Result Value   Glucose, Bld 122 (*)    Total Protein 6.2 (*)    ALT 11 (*)    All other components within normal limits  CBC - Abnormal; Notable for the following:    RDW 16.3 (*)    All other components within normal limits  URINALYSIS, ROUTINE W REFLEX MICROSCOPIC - Abnormal; Notable for the following:    Protein, ur 100 (*)    Squamous Epithelial / LPF 0-5 (*)    All other components within normal limits  LIPASE, BLOOD    Radiology Dg Abdomen Acute W/chest  Result Date: 11/15/2016 CLINICAL DATA:  Initial evaluation for acute left lower quadrant pain for 3 weeks. History of multiple myeloma. EXAM: DG ABDOMEN ACUTE W/ 1V CHEST COMPARISON:  Prior CT from 10/27/2016. FINDINGS: Cardiac and mediastinal silhouettes are within normal limits. Scattered atheromatous plaque noted within the aortic arch. Lungs normally inflated. Linear opacities of the left lung base most compatible with atelectasis. No other focal airspace disease. No pulmonary edema or pleural effusion. No pneumothorax. Bowel gas pattern within normal limits without evidence for obstruction or ileus. No abnormal bowel wall thickening. Moderate to large amount of retained stool within the right colon. No soft tissue mass or abnormal calcification. Vascular stent overlies the left upper abdomen. No acute osseous abnormality. IMPRESSION: 1. Nonobstructive bowel gas pattern with no radiographic evidence for acute intra-abdominal process. 2. Moderate to large amount of retained stool within the right colon. 3. Mild left basilar atelectasis. No other active cardiopulmonary disease. 4. Aortic atherosclerosis. Electronically Signed   By: Jeannine Boga M.D.   On: 11/15/2016 00:07    Procedures Procedures (including critical care time)  DIAGNOSTIC STUDIES: Oxygen Saturation is 98% on RA, normal by my interpretation.    COORDINATION OF CARE: 10:06 PM Will administer fluids, Zofran, and Dilaudid. Discussed treatment plan with pt at bedside and pt agreed to plan.  Medications Ordered in ED Medications  sodium chloride 0.9 % bolus 1,000 mL (0 mLs Intravenous Stopped 11/15/16 0301)  ondansetron (ZOFRAN) injection 4 mg (4  mg Intravenous Given 11/14/16 2244)  HYDROmorphone (DILAUDID) injection 0.5 mg (0.5 mg Intravenous Given 11/14/16 2244)     Initial Impression / Assessment and Plan / ED Course  I have reviewed the triage vital signs and the nursing notes.  Pertinent labs & imaging results that were available during my care of the patient were reviewed by me and considered in my medical decision making (see chart for details).     Patient seen and examined. Work-up initiated. Medications ordered.   Vital signs reviewed and are as follows: BP 136/68   Pulse (!) 45   Resp 18   SpO2 98%   She is feeling much better on recheck.   Work-up reviewed with Dr. Zenia Resides who has seen patient. Agrees stable for d/c. UA is negative. Will d/c with mag citrate for constipation. Encouraged GI f/u as planned.   The patient was urged to return to the Emergency Department immediately with worsening of current symptoms, worsening abdominal pain, persistent vomiting, blood noted in stools, fever, or any other concerns. The patient verbalized understanding.    Final Clinical Impressions(s) / ED Diagnoses   Final diagnoses:  Left lower quadrant pain  Constipation, unspecified constipation type   Abd pain: Patient with abdominal pain x several weeks. Upcoming GI appt. Vitals are stable, no fever. Labs reassuring. Imaging no issues today, previous CT reviewed. No signs of dehydration, patient is tolerating PO's. Lungs are clear and no signs  suggestive of PNA. Low concern for appendicitis, cholecystitis, pancreatitis, ruptured viscus, UTI, kidney stone, aortic dissection, aortic aneurysm or other emergent abdominal etiology. Supportive therapy indicated with return if symptoms worsen.   Bradycardia: Pt asymptomatic. No syncope/CP. She has had this on previous EKGs last year. Think this is unrelated to current complaint. No beta blocker.    New Prescriptions New Prescriptions   MAGNESIUM CITRATE SOLN    Take 296 mLs (1 Bottle total) by mouth once.   I personally performed the services described in this documentation, which was scribed in my presence. The recorded information has been reviewed and is accurate.     Carlisle Cater, PA-C 11/15/16 [phone]

## 2016-11-14 NOTE — ED Notes (Signed)
Pt is c/o left sided abd pain that  is accompanied with nausea vomiting, Pt reports that symptoms began today. Pt states that she had taken Zofran however was un effective. Pt denies diarrhea.

## 2016-11-14 NOTE — Telephone Encounter (Signed)
Called to check in with patient.  Patient states that she was discharged from the emergency department last night after receiving nausea medicine, pain medication and IV fluid rehydration.  She states she is feeling some better this morning but is still a little nauseous.  She confirmed that she did get a refill of her pain medication while in the emergency department last night.  She is scheduled to be seen by [facility] on Monday, 11/17/2016 at 10:30 AM.

## 2016-11-14 NOTE — ED Triage Notes (Signed)
Pt reports vomiting all day today with weakness and LLQ pain.  She took vicodin for the pain around 1700.  Pt is a chemo pt, received her last chemo yesterday.  Pt is A&Ox 4.

## 2016-11-15 LAB — URINALYSIS, ROUTINE W REFLEX MICROSCOPIC
BILIRUBIN URINE: NEGATIVE
Bacteria, UA: NONE SEEN
Glucose, UA: NEGATIVE mg/dL
Hgb urine dipstick: NEGATIVE
KETONES UR: NEGATIVE mg/dL
LEUKOCYTES UA: NEGATIVE
Nitrite: NEGATIVE
PROTEIN: 100 mg/dL — AB
Specific Gravity, Urine: 1.012 (ref 1.005–1.030)
pH: 6 (ref 5.0–8.0)

## 2016-11-15 MED ORDER — MAGNESIUM CITRATE PO SOLN: 1.0000 | Freq: Once | ORAL | 1 refills | Status: AC

## 2016-11-15 NOTE — ED Provider Notes (Signed)
Medical screening examination/treatment/procedure(s) were conducted as a shared visit with non-physician practitioner(s) and myself.  I personally evaluated the patient during the encounter.   EKG Interpretation None     pt here with recurrent abd pain x several weeks Labs today w/o significant change and abd series shows constipation Seen by her pcp for same and has referred to [facility] for d/c   Lacretia Leigh, MD 11/15/16 [phone]

## 2016-11-16 ENCOUNTER — Emergency Department (HOSPITAL_COMMUNITY)
Admission: EM | Admit: 2016-11-16 | Discharge: 2016-11-16 | Disposition: A | Discharge status: Home / Self Care | Payer: Medicare HMO | Source: Home / Self Care | Attending: Emergency Medicine | Admitting: Emergency Medicine

## 2016-11-16 ENCOUNTER — Encounter (HOSPITAL_COMMUNITY): Payer: Self-pay | Admitting: Emergency Medicine

## 2016-11-16 DIAGNOSIS — F17210 Nicotine dependence, cigarettes, uncomplicated: Secondary | ICD-10-CM | POA: Insufficient documentation

## 2016-11-16 DIAGNOSIS — R109 Unspecified abdominal pain: Secondary | ICD-10-CM

## 2016-11-16 DIAGNOSIS — I10 Essential (primary) hypertension: Secondary | ICD-10-CM

## 2016-11-16 DIAGNOSIS — I252 Old myocardial infarction: Secondary | ICD-10-CM | POA: Insufficient documentation

## 2016-11-16 DIAGNOSIS — I2510 Atherosclerotic heart disease of native coronary artery without angina pectoris: Secondary | ICD-10-CM | POA: Insufficient documentation

## 2016-11-16 DIAGNOSIS — Z955 Presence of coronary angioplasty implant and graft: Secondary | ICD-10-CM | POA: Insufficient documentation

## 2016-11-16 DIAGNOSIS — R1084 Generalized abdominal pain: Secondary | ICD-10-CM

## 2016-11-16 DIAGNOSIS — Z7982 Long term (current) use of aspirin: Secondary | ICD-10-CM

## 2016-11-16 LAB — CBC WITH DIFFERENTIAL/PLATELET
BASOS PCT: 0 %
Basophils Absolute: 0 10*3/uL (ref 0.0–0.1)
EOS ABS: 0 10*3/uL (ref 0.0–0.7)
Eosinophils Relative: 0 %
HCT: 40.6 % (ref 36.0–46.0)
Hemoglobin: 13.3 g/dL (ref 12.0–15.0)
Lymphocytes Relative: 19 %
Lymphs Abs: 1.2 10*3/uL (ref 0.7–4.0)
MCH: 30.1 pg (ref 26.0–34.0)
MCHC: 32.8 g/dL (ref 30.0–36.0)
MCV: 91.9 fL (ref 78.0–100.0)
MONOS PCT: 12 %
Monocytes Absolute: 0.8 10*3/uL (ref 0.1–1.0)
Neutro Abs: 4.5 10*3/uL (ref 1.7–7.7)
Neutrophils Relative %: 69 %
Platelets: 176 10*3/uL (ref 150–400)
RBC: 4.42 MIL/uL (ref 3.87–5.11)
RDW: 15.8 % — AB (ref 11.5–15.5)
WBC: 6.6 10*3/uL (ref 4.0–10.5)

## 2016-11-16 LAB — COMPREHENSIVE METABOLIC PANEL
ALBUMIN: 4.4 g/dL (ref 3.5–5.0)
ALK PHOS: 79 U/L (ref 38–126)
ALT: 11 U/L — ABNORMAL LOW (ref 14–54)
ANION GAP: 9 (ref 5–15)
AST: 15 U/L (ref 15–41)
BILIRUBIN TOTAL: 0.8 mg/dL (ref 0.3–1.2)
BUN: 11 mg/dL (ref 6–20)
CALCIUM: 9.2 mg/dL (ref 8.9–10.3)
CO2: 22 mmol/L (ref 22–32)
Chloride: 106 mmol/L (ref 101–111)
Creatinine, Ser: 0.89 mg/dL (ref 0.44–1.00)
GFR calc Af Amer: >60 mL/min (ref >60)
GFR calc non Af Amer: >60 mL/min (ref >60)
GLUCOSE: 108 mg/dL — AB (ref 65–99)
Potassium: 4 mmol/L (ref 3.5–5.1)
Sodium: 137 mmol/L (ref 135–145)
TOTAL PROTEIN: 6.9 g/dL (ref 6.5–8.1)

## 2016-11-16 LAB — LIPASE, BLOOD: Lipase: 23 U/L (ref 11–51)

## 2016-11-16 MED ORDER — SODIUM CHLORIDE 0.9 % IV BOLUS (SEPSIS)
1000.0000 mL | Freq: Once | INTRAVENOUS | Status: AC
  Administered 2016-11-16: 1000 mL via INTRAVENOUS

## 2016-11-16 MED ORDER — HYDROMORPHONE HCL 1 MG/ML IJ SOLN
1.0000 mg | Freq: Once | INTRAMUSCULAR | Status: AC
  Administered 2016-11-16: 1 mg via INTRAVENOUS
  Filled 2016-11-16: qty 1

## 2016-11-16 MED ORDER — OXYCODONE-ACETAMINOPHEN 5-325 MG PO TABS: 1.0000 | ORAL_TABLET | ORAL | 0 refills | Status: DC | PRN

## 2016-11-16 NOTE — ED Triage Notes (Signed)
Pt c/o left side abdominal pain, emesis x 3 weeks. Last chemo treatment 2 weeks ago. Seen in ED multiple times for the same, has appointment with gastroenterology tomorrow but pain is uncontrolled.

## 2016-11-16 NOTE — ED Provider Notes (Signed)
[place] DEPT Provider Note   CSN: [id number] Arrival date & time: 11/16/16  1657     History   Chief Complaint Chief Complaint  Patient presents with  . Abdominal Pain    Chemo Card    HPI Abigail [name] is a 71 y.o. female.  HPI   Pt with multiple medical problems currently on chemotherapy for multiple myeloma p/w persistent abdominal pain and N/V x 3 weeks.  Has been seen in ED multiple times for same and has appointment with [facility] tomorrow.  States the vomiting has "slowed down" with zofran and compazine but the pain has been persistent despite vicodin.  She vomited three times today, last at 4:30pm.  Has been constipated but is able to pass gas.  Last BM was 3 days ago.  Is eating very little.  Reports her urination has improved, was previously only urinated twice daily but is now back to a normal amount.  Denies fevers, Cp, SOB, URI symptoms.  Requests additional pain medication to help her until her appointment tomorrow.    Past Medical History:  Diagnosis Date  . Anemia 07/2015  . Bilateral renal artery stenosis ([place]) 1999   s/p stenting  . Coronary artery disease 1999  . Depression   . Diverticulosis   . Duodenitis   . Gastric erosions   . Gastric ulcer   . GERD (gastroesophageal reflux disease)   . Heart murmur   . History of blood transfusion ~ 03/2016   "low HgB; practically nonexistent"  . Hyperlipidemia   . Hypertension   . MI (myocardial infarction) 1999  . Multiple myeloma ([place])   . Retinal hemorrhage, right eye   . Schatzki's ring   . Tubular adenoma of colon     Patient Active Problem List   Diagnosis Date Noted  . Abdominal pain 11/05/2016  . Multiple myeloma ([place]) 06/18/2016  . Malnutrition of moderate degree 06/14/2016  . 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 Exertional dyspnea 11/01/2015  . Coronary atherosclerosis of native coronary artery 11/01/2015  . Shortness of breath 11/01/2015  . Chest pain  12/31/2011    Past Surgical History:  Procedure Laterality Date  . APPENDECTOMY    . BACK SURGERY    . CATARACT EXTRACTION W/ INTRAOCULAR LENS IMPLANT Right 2014  . CORONARY ANGIOPLASTY WITH STENT PLACEMENT  1999  . ESOPHAGOGASTRODUODENOSCOPY N/A 11/03/2015   Procedure: ESOPHAGOGASTRODUODENOSCOPY (EGD);  Surgeon: Carol Ada, MD;  Location: [facility] ENDOSCOPY;  Service: Endoscopy;  Laterality: N/A;  . LUMBAR [place]    . RENAL ARTERY STENT  1999   bil RAS so ? bil vs unilateral stents  . RETINAL LASER PROCEDURE Right 2012   "bleeding"  . TONSILLECTOMY    . TOTAL ABDOMINAL HYSTERECTOMY      OB History    No data available       Home Medications    Prior to Admission medications   Medication Sig Start Date End Date Taking? Authorizing Provider  acyclovir (ZOVIRAX) 400 MG tablet TAKE 1 TABLET (400 MG TOTAL) BY MOUTH 2 (TWO) TIMES DAILY. 08/29/16   Ladell Pier, MD  amLODipine (NORVASC) 10 MG tablet Take 10 mg by mouth at bedtime.  01/03/16   Historical Provider, MD  Ascorbic Acid (VITAMIN C) 1000 MG tablet Take 1,000 mg by mouth daily.     Historical Provider, MD  aspirin EC 81 MG tablet Take 81 mg by mouth daily.    Historical Provider, MD  calcium  carbonate (OS-CAL - DOSED IN MG OF ELEMENTAL CALCIUM) 1250 (500 Ca) MG tablet Take 1 tablet (500 mg of elemental calcium total) by mouth daily with breakfast. 06/19/16   [name], MD  Cholecalciferol (VITAMIN D PO) Take 1 capsule by mouth daily.    Historical Provider, MD  dexamethasone (DECADRON) 4 MG tablet Decadron 40 mg once a week on day of Velcade injection Patient taking differently: Take 20 mg by mouth once a week. Decadron 40 mg once a week on day of Velcade injection  07/24/16   Owens Shark, NP  docusate sodium (COLACE) 100 MG capsule Take 1 capsule (100 mg total) by mouth every 12 (twelve) hours. 11/13/16   Charlesetta Shanks, MD  FLUoxetine (PROZAC) 10 MG capsule Take 10 mg by mouth at bedtime.     Historical Provider, MD    HYDROcodone-acetaminophen (NORCO/VICODIN) 5-325 MG tablet Take 1-2 tablets by mouth every 4 (four) hours as needed for moderate pain or severe pain. 11/13/16   Charlesetta Shanks, MD  ibuprofen (ADVIL,MOTRIN) 200 MG tablet Take 400 mg by mouth every 6 (six) hours as needed.    Historical Provider, MD  isosorbide mononitrate (IMDUR) 60 MG 24 hr tablet Take 1 tablet (60 mg total) by mouth daily. 06/19/16   [name], MD  KLOR-CON M10 10 MEQ tablet Take 10 mEq by mouth daily.  06/13/16   Historical Provider, MD  lenalidomide (REVLIMID) 15 MG capsule Take 1 capsule (15 mg total) by mouth daily. For 14 days, then 14 days off. 11/07/16   Ladell Pier, MD  ondansetron (ZOFRAN) 4 MG tablet Take 1 tablet (4 mg total) by mouth every 8 (eight) hours as needed for nausea or vomiting. 10/27/16   Nicole Pisciotta, PA-C  oxyCODONE-acetaminophen (PERCOCET/ROXICET) 5-325 MG tablet Take 1-2 tablets by mouth every 4 (four) hours as needed for moderate pain or severe pain. 11/16/16   Clayton Bibles, PA-C  pantoprazole (PROTONIX) 40 MG tablet Take 1 tablet (40 mg total) by mouth daily. Reported on 10/19/2015 01/14/16   [name], MD  pravastatin (PRAVACHOL) 40 MG tablet Take 40 mg by mouth every evening. 06/03/15   Historical Provider, MD  prochlorperazine (COMPAZINE) 5 MG tablet Take 1 tablet (5 mg total) by mouth every 6 (six) hours as needed for nausea or vomiting. 11/03/16   Ladell Pier, MD    Family History Family History  Problem Relation Age of Onset  . Colon cancer Neg Hx     Social History Social History  Substance Use Topics  . Smoking status: Current Every Day Smoker    Packs/day: 0.50    Years: 48.00    Types: Cigarettes  . Smokeless tobacco: Never Used  . Alcohol use No     Allergies   Sulfa antibiotics   Review of Systems Review of Systems  All other systems reviewed and are negative.    Physical Exam Updated Vital Signs BP 155/88   Pulse 64   Temp 98.7 F (37.1 C) (Core (Comment))    Resp 18   SpO2 100%   Physical Exam  Constitutional: She appears well-developed and well-nourished. No distress.  HENT:  Head: Normocephalic and atraumatic.  Neck: Neck supple.  Cardiovascular: Normal rate and regular rhythm.   Pulmonary/Chest: Effort normal and breath sounds normal. No respiratory distress. She has no wheezes. She has no rales.  Abdominal: Soft. She exhibits no distension. There is tenderness (Diffuse, worse in the upper and left abdomen). There is no rebound and no guarding.  Neurological: She is alert.  Skin: She is not diaphoretic.  Nursing note and vitals reviewed.    ED Treatments / Results  Labs (all labs ordered are listed, but only abnormal results are displayed) Labs Reviewed  COMPREHENSIVE METABOLIC PANEL - Abnormal; Notable for the following:       Result Value   Glucose, Bld 108 (*)    ALT 11 (*)    All other components within normal limits  CBC WITH DIFFERENTIAL/PLATELET - Abnormal; Notable for the following:    RDW 15.8 (*)    All other components within normal limits  LIPASE, BLOOD    EKG  EKG Interpretation None       Radiology Dg Abdomen Acute W/chest  Result Date: 11/15/2016 CLINICAL DATA:  Initial evaluation for acute left lower quadrant pain for 3 weeks. History of multiple myeloma. EXAM: DG ABDOMEN ACUTE W/ 1V CHEST COMPARISON:  Prior CT from 10/27/2016. FINDINGS: Cardiac and mediastinal silhouettes are within normal limits. Scattered atheromatous plaque noted within the aortic arch. Lungs normally inflated. Linear opacities of the left lung base most compatible with atelectasis. No other focal airspace disease. No pulmonary edema or pleural effusion. No pneumothorax. Bowel gas pattern within normal limits without evidence for obstruction or ileus. No abnormal bowel wall thickening. Moderate to large amount of retained stool within the right colon. No soft tissue mass or abnormal calcification. Vascular stent overlies the left upper  abdomen. No acute osseous abnormality. IMPRESSION: 1. Nonobstructive bowel gas pattern with no radiographic evidence for acute intra-abdominal process. 2. Moderate to large amount of retained stool within the right colon. 3. Mild left basilar atelectasis. No other active cardiopulmonary disease. 4. Aortic atherosclerosis. Electronically Signed   By: Jeannine Boga M.D.   On: 11/15/2016 00:07    Procedures Procedures (including critical care time)  Medications Ordered in ED Medications  HYDROmorphone (DILAUDID) injection 1 mg (1 mg Intravenous Given 11/16/16 2002)  sodium chloride 0.9 % bolus 1,000 mL (0 mLs Intravenous Stopped 11/16/16 2056)     Initial Impression / Assessment and Plan / ED Course  I have reviewed the triage vital signs and the nursing notes.  Pertinent labs & imaging results that were available during my care of the patient were reviewed by me and considered in my medical decision making (see chart for details).  Clinical Course as of Nov 16 2108  Nancy Fetter Nov 16, 2016  2049 Pain well controlled  [EW]    Clinical Course User Index [EW] Clayton Bibles, PA-C   Afebrile nontoxic patient undergoing chemotherapy for multiple myeloma with ongoing abdominal pain and vomiting for several weeks.  Labs today are unremarkable.  CT performed earlier this month demonstrating new masses.  Has GI follow up tomorrow.  Pt requests pain medication - she is only on vicodin at home, will increase to percocet.  Checked on [employer].  Pain improved with single dose of dilaudid.  Pt denies nausea currently, declined antiemetic.  D/C home with GI follow up tomorrow, oncology follow up as planned.    Final Clinical Impressions(s) / ED Diagnoses   Final diagnoses:  Abdominal pain, unspecified abdominal location    New Prescriptions New Prescriptions   OXYCODONE-ACETAMINOPHEN (PERCOCET/ROXICET) 5-325 MG TABLET    Take 1-2 tablets by mouth every 4 (four) hours as needed for moderate pain or  severe pain.     Clayton Bibles, PA-C 11/16/16 2115    Milton Ferguson, MD 11/16/16 2239

## 2016-11-16 NOTE — Discharge Instructions (Signed)
Read the information below.  Use the prescribed medication as directed.  Please discuss all new medications with your pharmacist.  Do not take additional tylenol while taking the prescribed pain medication to avoid overdose.  You may return to the Emergency Department at any time for worsening condition or any new symptoms that concern you.    If you develop high fevers, worsening abdominal pain, uncontrolled vomiting, or are unable to tolerate fluids by mouth, return to the ER for a recheck.   °

## 2016-11-17 ENCOUNTER — Encounter: Payer: Self-pay | Admitting: Physician Assistant

## 2016-11-17 ENCOUNTER — Inpatient Hospital Stay (HOSPITAL_COMMUNITY)
Admission: AD | Admit: 2016-11-17 | Discharge: 2016-11-21 | DRG: 391 | Disposition: A | Discharge status: Home / Self Care | Payer: Medicare HMO | Source: Ambulatory Visit | Attending: Internal Medicine | Admitting: Internal Medicine

## 2016-11-17 ENCOUNTER — Encounter (HOSPITAL_COMMUNITY): Payer: Self-pay

## 2016-11-17 ENCOUNTER — Ambulatory Visit (INDEPENDENT_AMBULATORY_CARE_PROVIDER_SITE_OTHER): Payer: Medicare HMO | Admitting: Physician Assistant

## 2016-11-17 VITALS — BP 172/78 | HR 47 | Ht 65.5 in | Wt 157.0 lb

## 2016-11-17 DIAGNOSIS — R634 Abnormal weight loss: Secondary | ICD-10-CM | POA: Diagnosis not present

## 2016-11-17 DIAGNOSIS — Z9221 Personal history of antineoplastic chemotherapy: Secondary | ICD-10-CM

## 2016-11-17 DIAGNOSIS — K2210 Ulcer of esophagus without bleeding: Secondary | ICD-10-CM

## 2016-11-17 DIAGNOSIS — D61818 Other pancytopenia: Secondary | ICD-10-CM | POA: Diagnosis not present

## 2016-11-17 DIAGNOSIS — I2510 Atherosclerotic heart disease of native coronary artery without angina pectoris: Secondary | ICD-10-CM | POA: Diagnosis present

## 2016-11-17 DIAGNOSIS — E876 Hypokalemia: Secondary | ICD-10-CM | POA: Diagnosis not present

## 2016-11-17 DIAGNOSIS — N2889 Other specified disorders of kidney and ureter: Secondary | ICD-10-CM

## 2016-11-17 DIAGNOSIS — E440 Moderate protein-calorie malnutrition: Secondary | ICD-10-CM

## 2016-11-17 DIAGNOSIS — N281 Cyst of kidney, acquired: Secondary | ICD-10-CM | POA: Diagnosis present

## 2016-11-17 DIAGNOSIS — K210 Gastro-esophageal reflux disease with esophagitis: Principal | ICD-10-CM | POA: Diagnosis present

## 2016-11-17 DIAGNOSIS — E43 Unspecified severe protein-calorie malnutrition: Secondary | ICD-10-CM | POA: Insufficient documentation

## 2016-11-17 DIAGNOSIS — I739 Peripheral vascular disease, unspecified: Secondary | ICD-10-CM | POA: Diagnosis present

## 2016-11-17 DIAGNOSIS — R112 Nausea with vomiting, unspecified: Secondary | ICD-10-CM

## 2016-11-17 DIAGNOSIS — K222 Esophageal obstruction: Secondary | ICD-10-CM | POA: Diagnosis present

## 2016-11-17 DIAGNOSIS — F17210 Nicotine dependence, cigarettes, uncomplicated: Secondary | ICD-10-CM | POA: Diagnosis present

## 2016-11-17 DIAGNOSIS — Z7982 Long term (current) use of aspirin: Secondary | ICD-10-CM

## 2016-11-17 DIAGNOSIS — Z955 Presence of coronary angioplasty implant and graft: Secondary | ICD-10-CM

## 2016-11-17 DIAGNOSIS — R109 Unspecified abdominal pain: Secondary | ICD-10-CM

## 2016-11-17 DIAGNOSIS — K449 Diaphragmatic hernia without obstruction or gangrene: Secondary | ICD-10-CM | POA: Diagnosis present

## 2016-11-17 DIAGNOSIS — K5900 Constipation, unspecified: Secondary | ICD-10-CM | POA: Diagnosis not present

## 2016-11-17 DIAGNOSIS — R1032 Left lower quadrant pain: Secondary | ICD-10-CM | POA: Diagnosis not present

## 2016-11-17 DIAGNOSIS — I252 Old myocardial infarction: Secondary | ICD-10-CM

## 2016-11-17 DIAGNOSIS — Z6826 Body mass index (BMI) 26.0-26.9, adult: Secondary | ICD-10-CM

## 2016-11-17 DIAGNOSIS — F329 Major depressive disorder, single episode, unspecified: Secondary | ICD-10-CM | POA: Diagnosis present

## 2016-11-17 DIAGNOSIS — E785 Hyperlipidemia, unspecified: Secondary | ICD-10-CM | POA: Diagnosis present

## 2016-11-17 DIAGNOSIS — C9000 Multiple myeloma not having achieved remission: Secondary | ICD-10-CM | POA: Diagnosis not present

## 2016-11-17 DIAGNOSIS — Z79899 Other long term (current) drug therapy: Secondary | ICD-10-CM

## 2016-11-17 DIAGNOSIS — R1012 Left upper quadrant pain: Secondary | ICD-10-CM

## 2016-11-17 DIAGNOSIS — K3189 Other diseases of stomach and duodenum: Secondary | ICD-10-CM | POA: Diagnosis present

## 2016-11-17 DIAGNOSIS — I10 Essential (primary) hypertension: Secondary | ICD-10-CM | POA: Diagnosis present

## 2016-11-17 LAB — COMPREHENSIVE METABOLIC PANEL
ALT: 12 U/L — AB (ref 14–54)
AST: 16 U/L (ref 15–41)
Albumin: 4.5 g/dL (ref 3.5–5.0)
Alkaline Phosphatase: 80 U/L (ref 38–126)
Anion gap: 10 (ref 5–15)
BUN: 11 mg/dL (ref 6–20)
CHLORIDE: 106 mmol/L (ref 101–111)
CO2: 25 mmol/L (ref 22–32)
CREATININE: 0.89 mg/dL (ref 0.44–1.00)
Calcium: 9.9 mg/dL (ref 8.9–10.3)
GFR calc non Af Amer: >60 mL/min (ref >60)
Glucose, Bld: 122 mg/dL — ABNORMAL HIGH (ref 65–99)
POTASSIUM: 3.9 mmol/L (ref 3.5–5.1)
SODIUM: 141 mmol/L (ref 135–145)
Total Bilirubin: 0.7 mg/dL (ref 0.3–1.2)
Total Protein: 7.4 g/dL (ref 6.5–8.1)

## 2016-11-17 LAB — MAGNESIUM: MAGNESIUM: 2.1 mg/dL (ref 1.7–2.4)

## 2016-11-17 LAB — URINALYSIS, ROUTINE W REFLEX MICROSCOPIC
BILIRUBIN URINE: NEGATIVE
Bacteria, UA: NONE SEEN
GLUCOSE, UA: NEGATIVE mg/dL
HGB URINE DIPSTICK: NEGATIVE
KETONES UR: 5 mg/dL — AB
LEUKOCYTES UA: NEGATIVE
NITRITE: NEGATIVE
PH: 7 (ref 5.0–8.0)
PROTEIN: 100 mg/dL — AB
RBC / HPF: NONE SEEN RBC/hpf (ref 0–5)
Specific Gravity, Urine: 1.01 (ref 1.005–1.030)

## 2016-11-17 LAB — CBC
HCT: 42.8 % (ref 36.0–46.0)
Hemoglobin: 13.5 g/dL (ref 12.0–15.0)
MCH: 29.5 pg (ref 26.0–34.0)
MCHC: 31.5 g/dL (ref 30.0–36.0)
MCV: 93.7 fL (ref 78.0–100.0)
PLATELETS: 169 10*3/uL (ref 150–400)
RBC: 4.57 MIL/uL (ref 3.87–5.11)
RDW: 15.9 % — AB (ref 11.5–15.5)
WBC: 6.7 10*3/uL (ref 4.0–10.5)

## 2016-11-17 MED ORDER — PRAVASTATIN SODIUM 20 MG PO TABS
40.0000 mg | ORAL_TABLET | Freq: Every evening | ORAL | Status: DC
  Administered 2016-11-17 – 2016-11-20 (×4): 40 mg via ORAL
  Filled 2016-11-17 (×5): qty 2

## 2016-11-17 MED ORDER — AMLODIPINE BESYLATE 10 MG PO TABS
10.0000 mg | ORAL_TABLET | Freq: Every day | ORAL | Status: DC
  Administered 2016-11-17 – 2016-11-20 (×3): 10 mg via ORAL
  Filled 2016-11-17 (×4): qty 1

## 2016-11-17 MED ORDER — ENOXAPARIN SODIUM 40 MG/0.4ML ~~LOC~~ SOLN
40.0000 mg | SUBCUTANEOUS | Status: DC
  Administered 2016-11-17 – 2016-11-20 (×4): 40 mg via SUBCUTANEOUS
  Filled 2016-11-17 (×4): qty 0.4

## 2016-11-17 MED ORDER — ONDANSETRON HCL 4 MG/2ML IJ SOLN
4.0000 mg | Freq: Four times a day (QID) | INTRAMUSCULAR | Status: DC | PRN
  Administered 2016-11-17: 4 mg via INTRAVENOUS
  Filled 2016-11-17: qty 2

## 2016-11-17 MED ORDER — HYDRALAZINE HCL 20 MG/ML IJ SOLN: 10.0000 mg | INTRAMUSCULAR | Status: DC | PRN

## 2016-11-17 MED ORDER — ISOSORBIDE MONONITRATE ER 30 MG PO TB24
60.0000 mg | ORAL_TABLET | Freq: Every day | ORAL | Status: DC
  Administered 2016-11-18 – 2016-11-21 (×4): 60 mg via ORAL
  Filled 2016-11-17 (×4): qty 2

## 2016-11-17 MED ORDER — ACYCLOVIR 400 MG PO TABS
400.0000 mg | ORAL_TABLET | Freq: Two times a day (BID) | ORAL | Status: DC
  Administered 2016-11-17 – 2016-11-21 (×8): 400 mg via ORAL
  Filled 2016-11-17 (×9): qty 1

## 2016-11-17 MED ORDER — FLUOXETINE HCL 10 MG PO CAPS
10.0000 mg | ORAL_CAPSULE | Freq: Every day | ORAL | Status: DC
  Administered 2016-11-17 – 2016-11-20 (×4): 10 mg via ORAL
  Filled 2016-11-17 (×6): qty 1

## 2016-11-17 MED ORDER — PANTOPRAZOLE SODIUM 40 MG PO TBEC: 40.0000 mg | DELAYED_RELEASE_TABLET | Freq: Every day | ORAL | Status: DC

## 2016-11-17 MED ORDER — DEXTROSE-NACL 5-0.9 % IV SOLN
INTRAVENOUS | Status: DC
  Administered 2016-11-17 – 2016-11-19 (×5): via INTRAVENOUS

## 2016-11-17 MED ORDER — ASPIRIN EC 81 MG PO TBEC
81.0000 mg | DELAYED_RELEASE_TABLET | Freq: Every day | ORAL | Status: DC
Start: 2016-11-18 — End: 2016-11-21
  Administered 2016-11-18 – 2016-11-21 (×4): 81 mg via ORAL
  Filled 2016-11-17 (×4): qty 1

## 2016-11-17 MED ORDER — DOCUSATE SODIUM 100 MG PO CAPS
100.0000 mg | ORAL_CAPSULE | Freq: Two times a day (BID) | ORAL | Status: DC
  Administered 2016-11-17 – 2016-11-21 (×6): 100 mg via ORAL
  Filled 2016-11-17 (×7): qty 1

## 2016-11-17 MED ORDER — MORPHINE SULFATE (PF) 4 MG/ML IV SOLN
4.0000 mg | INTRAVENOUS | Status: DC | PRN
  Administered 2016-11-17 – 2016-11-21 (×18): 4 mg via INTRAVENOUS
  Filled 2016-11-17 (×19): qty 1

## 2016-11-17 MED ORDER — MORPHINE SULFATE (PF) 2 MG/ML IV SOLN
2.0000 mg | INTRAVENOUS | Status: DC | PRN
  Administered 2016-11-17: 2 mg via INTRAVENOUS
  Filled 2016-11-17: qty 1

## 2016-11-17 MED ORDER — PROMETHAZINE HCL 25 MG PO TABS: 12.5000 mg | ORAL_TABLET | Freq: Four times a day (QID) | ORAL | Status: DC | PRN

## 2016-11-17 NOTE — Progress Notes (Signed)
Aware that patient has arrived, please see Nicoletta Ba, PA-C office note from today for consult. Arranging EGD for tomorrow with Dr. Fuller Plan, will keep patient NPO after midnight tonight. Please await further recommendations after procedure tomorrow.  Ellouise Newer, PA-C

## 2016-11-17 NOTE — H&P (Signed)
History and Physical   Abigail [name] INO:[id number] DOB: 1946-10-15 DOA: 11/17/2016  Referring MD/NP/PA: Nicoletta Ba, PA PCP: Philis Fendt, MD Outpatient Specialists: Nicoletta Ba, PA  Patient coming from: [facility]  Chief Complaint: abdominal pain, intractable nausea and vomiting  HPI: Abigail [name] is a 71 y.o. female with a history of hiatal hernia and gastric erosions, left-sided diverticulosis sent for direct admission from GI office due to intractable nausea, vomiting, and abdominal pain.   She was being seen at [facility] for left mid and lower abdominal pain that is sharp, constant with abrupt flares, and severe, worsening over the past 3 weeks. She had sought care in the ED multiple times for this, but has been discharged home after labs are unrevealing. Her pain is not relieved by po hydrocodone at home and the associated, severe, constant nausea is not improved with compazine at home. She's been eating less and less, reports steady weight loss, and her family feels unable to support her with her weakness at home.   She was diagnosed with multiple myeloma in Aug 2017, followed by Dr. Ammie Dalton, and had started therapy with revlimid and velcade around that time. Due to concern that these were causing/contributing to symptoms, these have been held for the past 3 weeks with no improvement.    After evaluation at [facility], [facility] called for direct admission for management of severe symptoms, IV fluids, and further work up of symptoms with MRI and GI evaluation. She was accepted to med-surg bed. Morphine and zofran IV were given with improvement in symptoms. She has had 3 episodes of nonbloody, nonbilious emesis today, and has not been able to eat or drink anything.   Review of Systems: No fevers, chills, night sweats, dysphagia, odynophagia and per HPI. All others reviewed and are negative.   Past Medical History:  Diagnosis Date  . Anemia 07/2015  . Bilateral renal artery stenosis ([place])  1999   s/p stenting  . Coronary artery disease 1999  . Depression   . Diverticulosis   . Duodenitis   . Gastric erosions   . Gastric ulcer   . GERD (gastroesophageal reflux disease)   . Heart murmur   . History of blood transfusion ~ 03/2016   "low HgB; practically nonexistent"  . Hyperlipidemia   . Hypertension   . MI (myocardial infarction) 1999  . Multiple myeloma ([place])   . Retinal hemorrhage, right eye   . Schatzki's ring   . Tubular adenoma of colon     Past Surgical History:  Procedure Laterality Date  . APPENDECTOMY    . BACK SURGERY    . CATARACT EXTRACTION W/ INTRAOCULAR LENS IMPLANT Right 2014  . CORONARY ANGIOPLASTY WITH STENT PLACEMENT  1999  . ESOPHAGOGASTRODUODENOSCOPY N/A 11/03/2015   Procedure: ESOPHAGOGASTRODUODENOSCOPY (EGD);  Surgeon: Carol Ada, MD;  Location: [facility] ENDOSCOPY;  Service: Endoscopy;  Laterality: N/A;  . LUMBAR [place]    . RENAL ARTERY STENT  1999   bil RAS so ? bil vs unilateral stents  . RETINAL LASER PROCEDURE Right 2012   "bleeding"  . TONSILLECTOMY    . TOTAL ABDOMINAL HYSTERECTOMY      - Smoker, no significant EtOH or illicit drug use.  Allergies  Allergen Reactions  . Sulfa Antibiotics Rash    Family History  Problem Relation Age of Onset  . Colon cancer Neg Hx    - Family history otherwise reviewed and not pertinent.  Prior to Admission medications   Medication Sig  Start Date End Date Taking? Authorizing Provider  acyclovir (ZOVIRAX) 400 MG tablet TAKE 1 TABLET (400 MG TOTAL) BY MOUTH 2 (TWO) TIMES DAILY. 08/29/16   Ladell Pier, MD  amLODipine (NORVASC) 10 MG tablet Take 10 mg by mouth at bedtime.  01/03/16   Historical Provider, MD  Ascorbic Acid (VITAMIN C) 1000 MG tablet Take 1,000 mg by mouth daily.     Historical Provider, MD  aspirin EC 81 MG tablet Take 81 mg by mouth daily.    Historical Provider, MD  calcium carbonate (OS-CAL - DOSED IN MG OF ELEMENTAL CALCIUM) 1250 (500 Ca) MG tablet Take 1 tablet (500  mg of elemental calcium total) by mouth daily with breakfast. 06/19/16   [name], MD  Cholecalciferol (VITAMIN D PO) Take 1 capsule by mouth daily.    Historical Provider, MD  dexamethasone (DECADRON) 4 MG tablet Decadron 40 mg once a week on day of Velcade injection Patient taking differently: Take 20 mg by mouth once a week. Decadron 40 mg once a week on day of Velcade injection  07/24/16   Owens Shark, NP  docusate sodium (COLACE) 100 MG capsule Take 1 capsule (100 mg total) by mouth every 12 (twelve) hours. 11/13/16   Charlesetta Shanks, MD  FLUoxetine (PROZAC) 10 MG capsule Take 10 mg by mouth at bedtime.     Historical Provider, MD  HYDROcodone-acetaminophen (NORCO/VICODIN) 5-325 MG tablet Take 1-2 tablets by mouth every 4 (four) hours as needed for moderate pain or severe pain. 11/13/16   Charlesetta Shanks, MD  isosorbide mononitrate (IMDUR) 60 MG 24 hr tablet Take 1 tablet (60 mg total) by mouth daily. 06/19/16   [name], MD  KLOR-CON M10 10 MEQ tablet Take 10 mEq by mouth daily.  06/13/16   Historical Provider, MD  lenalidomide (REVLIMID) 15 MG capsule Take 1 capsule (15 mg total) by mouth daily. For 14 days, then 14 days off. Patient not taking: Reported on 11/17/2016 11/07/16   Ladell Pier, MD  ondansetron (ZOFRAN) 4 MG tablet Take 1 tablet (4 mg total) by mouth every 8 (eight) hours as needed for nausea or vomiting. 10/27/16   Nicole Pisciotta, PA-C  pantoprazole (PROTONIX) 40 MG tablet Take 1 tablet (40 mg total) by mouth daily. Reported on 10/19/2015 01/14/16   [name], MD  pravastatin (PRAVACHOL) 40 MG tablet Take 40 mg by mouth every evening. 06/03/15   Historical Provider, MD  prochlorperazine (COMPAZINE) 5 MG tablet Take 1 tablet (5 mg total) by mouth every 6 (six) hours as needed for nausea or vomiting. 11/03/16   Ladell Pier, MD    Physical Exam: Vitals:   11/17/16 1310  BP: (!) 202/73  Pulse: (!) 52  Resp: 18  Temp: 99.1 F (37.3 C)  TempSrc: Oral  SpO2: 98%    Weight: 72.2 kg (159 lb 2.8 oz)  Height: 5' 5.5" (1.664 m)   Constitutional: Chronically ill-appearing female calm but grimacing in pain Eyes: Lids and conjunctivae normal, PERRL ENMT: Mucous membranes are moist. Posterior pharynx clear of any exudate or lesions. Fair dentition.  Neck: normal, supple, no masses, no thyromegaly Respiratory: Non-labored breathing on room air without accessory muscle use. Clear breath sounds to auscultation bilaterally Cardiovascular: Sinus arrhythmia, no murmurs, rubs, or gallops. No carotid bruits. No JVD. No LE edema. 2+ pedal pulses. Abdomen: Normoactive bowel sounds. Abdomen appears nondistended with vertical infraumbilical scar and RLQ scar. Diffusely tender, worst in L mid and lower zones.  GU: No indwelling catheter  Musculoskeletal: No clubbing / cyanosis. No joint deformity upper and lower extremities. Good ROM, no contractures. Normal muscle tone.  Skin: Warm, dry. No rashes, wounds, no ulcers. No significant lesions noted.  Neurologic: CN II-XII grossly intact. Gait not assessed. Speech normal. No focal deficits in motor strength or sensation in all extremities.  Psychiatric: Alert and oriented x3. Normal judgment and insight  Labs on Admission: I have personally reviewed following labs and imaging studies  CBC:  Recent Labs Lab 11/13/16 1224 11/14/16 2257 11/16/16 1952  WBC 4.4 7.0 6.6  NEUTROABS 3.8  --  4.5  HGB 13.4 12.0 13.3  HCT 41.5 37.8 40.6  MCV 93.5 92.2 91.9  PLT 214 178 245   Basic Metabolic Panel:  Recent Labs Lab 11/13/16 1224 11/14/16 2257 11/16/16 1952  NA 142 141 137  K 4.1 3.8 4.0  CL  --  110 106  CO2 _0 GLUCOSE 138 122* 108*  BUN 9._1 CREATININE 0.9 0.78 0.89  CALCIUM 10.0 8.9 9.2   GFR: Estimated Creatinine Clearance: 60.1 mL/min (by C-G formula based on SCr of 0.89 mg/dL). Liver Function Tests:  Recent Labs Lab 11/13/16 1224 11/14/16 2257 11/16/16 1952  AST _2 ALT 11 11* 11*   ALKPHOS 111 70 79  BILITOT 0.51 0.7 0.8  PROT 7.4 6.2* 6.9  ALBUMIN 4.4 4.0 4.4    Recent Labs Lab 11/14/16 2257 11/16/16 1952  LIPASE 23 23   Urine analysis:    Component Value Date/Time   COLORURINE YELLOW 11/14/2016 0233   APPEARANCEUR CLEAR 11/14/2016 0233   LABSPEC 1.012 11/14/2016 0233   LABSPEC 1.010 10/30/2016 1522   PHURINE 6.0 11/14/2016 0233   GLUCOSEU NEGATIVE 11/14/2016 0233   GLUCOSEU Negative 10/30/2016 1522   HGBUR NEGATIVE 11/14/2016 0233   BILIRUBINUR NEGATIVE 11/14/2016 0233   BILIRUBINUR Negative 10/30/2016 1522   KETONESUR NEGATIVE 11/14/2016 0233   PROTEINUR 100 (A) 11/14/2016 0233   UROBILINOGEN 0.2 10/30/2016 1522   NITRITE NEGATIVE 11/14/2016 0233   LEUKOCYTESUR NEGATIVE 11/14/2016 0233   LEUKOCYTESUR Trace 10/30/2016 1522   Sepsis Labs: _3 (procalcitonin:4,lacticidven:4) )No results found for this or any previous visit (from the past 240 hour(s)).   Radiological Exams on Admission: No results found.  Assessment/Plan Principal Problem:   Intractable nausea and vomiting Active Problems:   Malnutrition of moderate degree   Multiple myeloma ([facility])   Abdominal pain   Intractable nausea and vomiting: With worsening weakness, decreasing po, and weight loss. Uncertain etiology at this time as work up to date has been largely negative. EGD 2017 by Dr. Benson Norway showed hiatal hernia and gastric erosions. C-scope 2016 showed diverticulosis and 2 polyps that were removed.  - Will observe for symptom management and further work up.  - Zofran IV prn nausea/vomiting, this seems to be effective - Morphine IV prn abdominal pain, initial dose ineffective, will increase this. Did counsel on desire to minimize narcotics to prevent ileus. - MRI abdomen  Bilateral renal cysts: On CT 10/27/2016 and seen on renal U/S in office at urology appointment.  - MRI as above  Multiple myeloma: Dx Aug 2017. Off chemotherapy x3 weeks due to concern for contributin to  symptoms.  - Dr. Benay Spice notified of admission and added to treatment team.   DVT prophylaxis: Lovenox Code Status: Full  Family Communication: Sister at bedside Disposition Plan: Uncertain Consults called: GI, Dr. Fuller Plan to see  Admission status: Observation    Vance Gather, MD [facility] Pager  [phone]  If 7PM-7AM, please contact night-coverage [email or web address] Password [email or web address] 11/17/2016, 1:49 PM

## 2016-11-17 NOTE — Progress Notes (Addendum)
 Subjective:    Patient ID: Abigail [name], female    DOB: 01/30/1946, 71 y.o.   MRN: [id number]  HPI Abigail [name] is a 71-year-old African-American female, known to Dr. Pyrtle, who comes in today with complaints of 3 week history of nausea and vomiting and left and mid abdominal pain which at this point is described as sharp and constant. She has not had any diarrhea or melena. No documented fever or chills. She has been taking Compazine 5 mg every 6 hours on a regular basis and using hydrocodone for pain. She has had no relief of her symptoms. She is able to keep down to small amounts of liquids and has lost about 12 pounds. She has had several ER visits since onset of symptoms. Patient was diagnosed with multiple myeloma in August 2017. She is currently being followed by Dr. Sherrill and had been on Revlimid and Velcade both of which have been held over the past 3 weeks. Since off chemotherapy she has had no improvement in the above symptoms. She was in the emergency room as soon as yesterday, labs were unrevealing and she was allowed discharged home. She did undergo CT of the abdomen and pelvis on 10/27/2016 with an ER visit which showed a 16.8 cm mass on her right kidney and multiple hypodense fluid attenuation masses in the left kidney which may be cysts. MRI was recommended. Patient related that she was seen by [facility]/Dr. Ottelin last week-14 copy of those notes. It appears that the 16 cm mass on the right kidney noted on CT scan is actually a 1.6 to by 1.24 cm lesion of the right kidney and consistent with a renal cyst by a renal ultrasound done at their office.  Previous GI evaluation with EGD about a year ago per Dr. Hung for anemia with finding of  a hiatal hernia and gastric erosions. Colonoscopy in 2016 with removal of 2 polyps and left-sided diverticulosis noted  Review of Systems Pertinent positive and negative review of systems were noted in the above HPI section.  All other review of  systems was otherwise negative.  Outpatient Encounter Prescriptions as of 11/17/2016  Medication Sig  . acyclovir (ZOVIRAX) 400 MG tablet TAKE 1 TABLET (400 MG TOTAL) BY MOUTH 2 (TWO) TIMES DAILY.  . amLODipine (NORVASC) 10 MG tablet Take 10 mg by mouth at bedtime.   . Ascorbic Acid (VITAMIN C) 1000 MG tablet Take 1,000 mg by mouth daily.   . aspirin EC 81 MG tablet Take 81 mg by mouth daily.  . calcium carbonate (OS-CAL - DOSED IN MG OF ELEMENTAL CALCIUM) 1250 (500 Ca) MG tablet Take 1 tablet (500 mg of elemental calcium total) by mouth daily with breakfast.  . Cholecalciferol (VITAMIN D PO) Take 1 capsule by mouth daily.  . dexamethasone (DECADRON) 4 MG tablet Decadron 40 mg once a week on day of Velcade injection (Patient taking differently: Take 20 mg by mouth once a week. Decadron 40 mg once a week on day of Velcade injection )  . docusate sodium (COLACE) 100 MG capsule Take 1 capsule (100 mg total) by mouth every 12 (twelve) hours.  . FLUoxetine (PROZAC) 10 MG capsule Take 10 mg by mouth at bedtime.   . HYDROcodone-acetaminophen (NORCO/VICODIN) 5-325 MG tablet Take 1-2 tablets by mouth every 4 (four) hours as needed for moderate pain or severe pain.  . isosorbide mononitrate (IMDUR) 60 MG 24 hr tablet Take 1 tablet (60 mg total) by mouth daily.  . KLOR-CON   M10 10 MEQ tablet Take 10 mEq by mouth daily.   . ondansetron (ZOFRAN) 4 MG tablet Take 1 tablet (4 mg total) by mouth every 8 (eight) hours as needed for nausea or vomiting.  . pantoprazole (PROTONIX) 40 MG tablet Take 1 tablet (40 mg total) by mouth daily. Reported on 10/19/2015  . pravastatin (PRAVACHOL) 40 MG tablet Take 40 mg by mouth every evening.  . prochlorperazine (COMPAZINE) 5 MG tablet Take 1 tablet (5 mg total) by mouth every 6 (six) hours as needed for nausea or vomiting.  . lenalidomide (REVLIMID) 15 MG capsule Take 1 capsule (15 mg total) by mouth daily. For 14 days, then 14 days off. (Patient not taking: Reported on  11/17/2016)  . [DISCONTINUED] ibuprofen (ADVIL,MOTRIN) 200 MG tablet Take 400 mg by mouth every 6 (six) hours as needed.  . [DISCONTINUED] oxyCODONE-acetaminophen (PERCOCET/ROXICET) 5-325 MG tablet Take 1-2 tablets by mouth every 4 (four) hours as needed for moderate pain or severe pain.   Facility-Administered Encounter Medications as of 11/17/2016  Medication  . ondansetron (ZOFRAN) tablet 8 mg  . ondansetron (ZOFRAN) tablet 8 mg   Allergies  Allergen Reactions  . Sulfa Antibiotics Rash   Patient Active Problem List   Diagnosis Date Noted  . Abdominal pain 11/05/2016  . Multiple myeloma (HCC) 06/18/2016  . Malnutrition of moderate degree 06/14/2016  . 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 Exertional dyspnea 11/01/2015  . Coronary atherosclerosis of native coronary artery 11/01/2015  . Shortness of breath 11/01/2015  . Chest pain 12/31/2011   Social History   Social History  . Marital status: Single    Spouse name: N/A  . Number of children: N/A  . Years of education: N/A   Occupational History  . Not on file.   Social History Main Topics  . Smoking status: Current Every Day Smoker    Packs/day: 0.50    Years: 48.00    Types: Cigarettes  . Smokeless tobacco: Never Used  . Alcohol use No  . Drug use: No  . Sexual activity: No   Other Topics Concern  . Not on file   Social History Narrative  . No narrative on file    Ms. Lechuga's family history is not on file.      Objective:    Vitals:   11/17/16 1030  BP: (!) 172/78  Pulse: (!) 47    Physical Exam  well-developed chronically ill-appearing African-American female, holding her abdomen and rocking, holding an emesis basin- accompanied by her sister blood pressure 172/78 pulse 47 , BMI 25. HEENT; nontraumatic normocephalic EOMI PERRLA sclera anicteric, Cardiovascular; irregular rate and rhythm with S1-S2 no murmur or gallop, Pulmonary ;clear bilaterally, Abdomen; soft, bowel  sounds are present she has a lower midline incisional scar and an appendectomy scar there is fullness in the left mid quadrant and fullness in the right mid quadrant on a she is tender in the left mid and left lower quadrant no definite mass, no palpable hepatosplenomegaly, Rectal; exam not done, Extremities; no clubbing cyanosis or edema skin warm and dry, Neuropsych ;mood and affect appropriate       Assessment & Plan:   #1 70-year-old female with multiple myeloma diagnosed August 2017 who had been on Revlimid and Velcade now presenting with 3 week history of intractable nausea vomiting and left-sided abdominal pain. No improvement in symptoms off Revlimid and Velcade 3 weeks. CT of the abdomen and pelvis on 10/27/2016, with no acute process, she   does have bilateral renal cysts. \Patient has had multiple ER visits. Etiology of her symptoms is not entirely clear, certainly Revlimid and Velcade can both be associated with nausea and vomiting. Her abdominal pain is not explained. #2 weight loss secondary to above #3 coronary artery disease #4 status post appendectomy and hysterectomy # 5 anemia related to multiple myeloma  Plan; patient is not making it as an outpatient, she will be admitted to [facility], on the hospitalist service. I have spoken with Dr. Grunz, who has accepted her. She needs IV fluid hydration, IV analgesics and anti-emetics MRI of the abdomen and pelvis Would involve oncology and asked Dr. Sherrill to see her GI will also follow. If MRI is negative she may need endoscopic evaluation.    Amy S Esterwood PA-C 11/17/2016   Cc: Avbuere, Edwin, MD   Addendum: Reviewed and agree with initial management. Jay M Pyrtle, MD    

## 2016-11-18 ENCOUNTER — Observation Stay (HOSPITAL_COMMUNITY): Payer: Medicare HMO | Admitting: Certified Registered Nurse Anesthetist

## 2016-11-18 ENCOUNTER — Encounter (HOSPITAL_COMMUNITY): Payer: Self-pay | Admitting: Certified Registered Nurse Anesthetist

## 2016-11-18 ENCOUNTER — Encounter (HOSPITAL_COMMUNITY)
Admission: AD | Disposition: A | Discharge status: Home / Self Care | Payer: Self-pay | Source: Ambulatory Visit | Attending: Internal Medicine

## 2016-11-18 ENCOUNTER — Observation Stay (HOSPITAL_COMMUNITY): Payer: Medicare HMO

## 2016-11-18 DIAGNOSIS — R112 Nausea with vomiting, unspecified: Secondary | ICD-10-CM | POA: Diagnosis present

## 2016-11-18 DIAGNOSIS — I2510 Atherosclerotic heart disease of native coronary artery without angina pectoris: Secondary | ICD-10-CM | POA: Diagnosis present

## 2016-11-18 DIAGNOSIS — E440 Moderate protein-calorie malnutrition: Secondary | ICD-10-CM | POA: Diagnosis not present

## 2016-11-18 DIAGNOSIS — K222 Esophageal obstruction: Secondary | ICD-10-CM | POA: Diagnosis present

## 2016-11-18 DIAGNOSIS — R109 Unspecified abdominal pain: Secondary | ICD-10-CM

## 2016-11-18 DIAGNOSIS — D61818 Other pancytopenia: Secondary | ICD-10-CM | POA: Diagnosis not present

## 2016-11-18 DIAGNOSIS — R1032 Left lower quadrant pain: Secondary | ICD-10-CM | POA: Diagnosis not present

## 2016-11-18 DIAGNOSIS — F329 Major depressive disorder, single episode, unspecified: Secondary | ICD-10-CM | POA: Diagnosis present

## 2016-11-18 DIAGNOSIS — I10 Essential (primary) hypertension: Secondary | ICD-10-CM | POA: Diagnosis present

## 2016-11-18 DIAGNOSIS — K210 Gastro-esophageal reflux disease with esophagitis: Secondary | ICD-10-CM | POA: Diagnosis present

## 2016-11-18 DIAGNOSIS — Z9221 Personal history of antineoplastic chemotherapy: Secondary | ICD-10-CM | POA: Diagnosis not present

## 2016-11-18 DIAGNOSIS — R339 Retention of urine, unspecified: Secondary | ICD-10-CM

## 2016-11-18 DIAGNOSIS — R1012 Left upper quadrant pain: Secondary | ICD-10-CM

## 2016-11-18 DIAGNOSIS — C9000 Multiple myeloma not having achieved remission: Secondary | ICD-10-CM

## 2016-11-18 DIAGNOSIS — N281 Cyst of kidney, acquired: Secondary | ICD-10-CM | POA: Diagnosis present

## 2016-11-18 DIAGNOSIS — Z7982 Long term (current) use of aspirin: Secondary | ICD-10-CM | POA: Diagnosis not present

## 2016-11-18 DIAGNOSIS — E43 Unspecified severe protein-calorie malnutrition: Secondary | ICD-10-CM | POA: Diagnosis present

## 2016-11-18 DIAGNOSIS — E876 Hypokalemia: Secondary | ICD-10-CM | POA: Diagnosis not present

## 2016-11-18 DIAGNOSIS — E785 Hyperlipidemia, unspecified: Secondary | ICD-10-CM | POA: Diagnosis present

## 2016-11-18 DIAGNOSIS — K3189 Other diseases of stomach and duodenum: Secondary | ICD-10-CM | POA: Diagnosis present

## 2016-11-18 DIAGNOSIS — Z79899 Other long term (current) drug therapy: Secondary | ICD-10-CM | POA: Diagnosis not present

## 2016-11-18 DIAGNOSIS — I739 Peripheral vascular disease, unspecified: Secondary | ICD-10-CM | POA: Diagnosis present

## 2016-11-18 DIAGNOSIS — Z955 Presence of coronary angioplasty implant and graft: Secondary | ICD-10-CM | POA: Diagnosis not present

## 2016-11-18 DIAGNOSIS — F17210 Nicotine dependence, cigarettes, uncomplicated: Secondary | ICD-10-CM | POA: Diagnosis present

## 2016-11-18 DIAGNOSIS — I252 Old myocardial infarction: Secondary | ICD-10-CM | POA: Diagnosis not present

## 2016-11-18 DIAGNOSIS — K5900 Constipation, unspecified: Secondary | ICD-10-CM | POA: Diagnosis not present

## 2016-11-18 DIAGNOSIS — Z6826 Body mass index (BMI) 26.0-26.9, adult: Secondary | ICD-10-CM | POA: Diagnosis not present

## 2016-11-18 DIAGNOSIS — K2210 Ulcer of esophagus without bleeding: Secondary | ICD-10-CM | POA: Diagnosis not present

## 2016-11-18 DIAGNOSIS — K449 Diaphragmatic hernia without obstruction or gangrene: Secondary | ICD-10-CM | POA: Diagnosis present

## 2016-11-18 HISTORY — PX: ESOPHAGOGASTRODUODENOSCOPY (EGD) WITH PROPOFOL: SHX5813

## 2016-11-18 LAB — BASIC METABOLIC PANEL
Anion gap: 5 (ref 5–15)
BUN: 8 mg/dL (ref 6–20)
CO2: 24 mmol/L (ref 22–32)
CREATININE: 0.78 mg/dL (ref 0.44–1.00)
Calcium: 8.6 mg/dL — ABNORMAL LOW (ref 8.9–10.3)
Chloride: 114 mmol/L — ABNORMAL HIGH (ref 101–111)
GFR calc Af Amer: >60 mL/min (ref >60)
GLUCOSE: 104 mg/dL — AB (ref 65–99)
Potassium: 3.6 mmol/L (ref 3.5–5.1)
SODIUM: 143 mmol/L (ref 135–145)

## 2016-11-18 LAB — CBC
HEMATOCRIT: 33.4 % — AB (ref 36.0–46.0)
Hemoglobin: 10.9 g/dL — ABNORMAL LOW (ref 12.0–15.0)
MCH: 29.9 pg (ref 26.0–34.0)
MCHC: 32.6 g/dL (ref 30.0–36.0)
MCV: 91.8 fL (ref 78.0–100.0)
PLATELETS: 131 10*3/uL — AB (ref 150–400)
RBC: 3.64 MIL/uL — ABNORMAL LOW (ref 3.87–5.11)
RDW: 15.7 % — AB (ref 11.5–15.5)
WBC: 3.7 10*3/uL — AB (ref 4.0–10.5)

## 2016-11-18 LAB — CORTISOL-AM, BLOOD: Cortisol - AM: 9 ug/dL (ref 6.7–22.6)

## 2016-11-18 SURGERY — ESOPHAGOGASTRODUODENOSCOPY (EGD) WITH PROPOFOL
Anesthesia: Monitor Anesthesia Care

## 2016-11-18 MED ORDER — LIDOCAINE 2% (20 MG/ML) 5 ML SYRINGE
INTRAMUSCULAR | Status: DC | PRN
  Administered 2016-11-18: 120 mg via INTRAVENOUS
  Administered 2016-11-18: 4 mg via INTRAVENOUS

## 2016-11-18 MED ORDER — LACTATED RINGERS IV SOLN
INTRAVENOUS | Status: DC | PRN
  Administered 2016-11-18: 11:00:00 via INTRAVENOUS

## 2016-11-18 MED ORDER — PANTOPRAZOLE SODIUM 40 MG PO TBEC
40.0000 mg | DELAYED_RELEASE_TABLET | Freq: Two times a day (BID) | ORAL | Status: DC
  Administered 2016-11-18 – 2016-11-21 (×7): 40 mg via ORAL
  Filled 2016-11-18 (×10): qty 1

## 2016-11-18 MED ORDER — GLYCOPYRROLATE 0.2 MG/ML IJ SOLN
INTRAMUSCULAR | Status: DC | PRN
  Administered 2016-11-18: 0.2 mg via INTRAVENOUS

## 2016-11-18 MED ORDER — GADOBENATE DIMEGLUMINE 529 MG/ML IV SOLN
15.0000 mL | Freq: Once | INTRAVENOUS | Status: AC | PRN
  Administered 2016-11-18: 15 mL via INTRAVENOUS

## 2016-11-18 MED ORDER — PROPOFOL 10 MG/ML IV BOLUS
INTRAVENOUS | Status: DC | PRN
  Administered 2016-11-18 (×2): 20 mg via INTRAVENOUS
  Administered 2016-11-18: 50 mg via INTRAVENOUS

## 2016-11-18 MED ORDER — PROPOFOL 10 MG/ML IV BOLUS
INTRAVENOUS | Status: AC
  Filled 2016-11-18: qty 20

## 2016-11-18 MED ORDER — ONDANSETRON HCL 4 MG PO TABS
4.0000 mg | ORAL_TABLET | Freq: Three times a day (TID) | ORAL | Status: DC
  Administered 2016-11-18 – 2016-11-21 (×13): 4 mg via ORAL
  Filled 2016-11-18 (×20): qty 1

## 2016-11-18 MED ORDER — PROPOFOL 500 MG/50ML IV EMUL
INTRAVENOUS | Status: DC | PRN
  Administered 2016-11-18: 100 ug/kg/min via INTRAVENOUS

## 2016-11-18 MED ORDER — ESMOLOL HCL 100 MG/10ML IV SOLN
INTRAVENOUS | Status: DC | PRN
  Administered 2016-11-18: 10 mg via INTRAVENOUS

## 2016-11-18 MED ORDER — LIDOCAINE 2% (20 MG/ML) 5 ML SYRINGE
INTRAMUSCULAR | Status: AC
  Filled 2016-11-18: qty 5

## 2016-11-18 SURGICAL SUPPLY — 15 items

## 2016-11-18 NOTE — Anesthesia Preprocedure Evaluation (Addendum)
Anesthesia Evaluation  Patient identified by MRN, date of birth, ID band Patient awake    Reviewed: Allergy & Precautions, NPO status , Patient's Chart, lab work & pertinent test results  Airway Mallampati: II  TM Distance: >3 FB Neck ROM: Full    Dental no notable dental hx.    Pulmonary neg pulmonary ROS, Current Smoker,    Pulmonary exam normal breath sounds clear to auscultation       Cardiovascular hypertension, + CAD, + Past MI and + Peripheral Vascular Disease   Rhythm:Regular Rate:Bradycardia  Pulse in 40's, no beta blocker Previous ekg rate in 70's    Neuro/Psych negative neurological ROS  negative psych ROS   GI/Hepatic Neg liver ROS, GERD  ,  Endo/Other  negative endocrine ROS  Renal/GU negative Renal ROS  negative genitourinary   Musculoskeletal negative musculoskeletal ROS (+)   Abdominal   Peds negative pediatric ROS (+)  Hematology  (+) anemia ,   Anesthesia Other Findings   Reproductive/Obstetrics negative OB ROS                            Anesthesia Physical Anesthesia Plan  ASA: III  Anesthesia Plan: MAC   Post-op Pain Management:    Induction: Intravenous  Airway Management Planned: Nasal Cannula  Additional Equipment:   Intra-op Plan:   Post-operative Plan:   Informed Consent: I have reviewed the patients History and Physical, chart, labs and discussed the procedure including the risks, benefits and alternatives for the proposed anesthesia with the patient or authorized representative who has indicated his/her understanding and acceptance.   Dental advisory given  Plan Discussed with: CRNA and Surgeon  Anesthesia Plan Comments: (pretreat with robinul and/or atropine for bradycardia)       Anesthesia Quick Evaluation

## 2016-11-18 NOTE — H&P (View-Only) (Signed)
Subjective:    Patient ID: Abigail [name], female    DOB: Jun 29, 1946, 71 y.o.   MRN: [id number]  HPI Abigail [name] is a 71 year old African-American female, known to Dr. Hilarie Fredrickson, who comes in today with complaints of 3 week history of nausea and vomiting and left and mid abdominal pain which at this point is described as sharp and constant. She has not had any diarrhea or melena. No documented fever or chills. She has been taking Compazine 5 mg every 6 hours on a regular basis and using hydrocodone for pain. She has had no relief of her symptoms. She is able to keep down to small amounts of liquids and has lost about 12 pounds. She has had several ER visits since onset of symptoms. Patient was diagnosed with multiple myeloma in August 2017. She is currently being followed by Dr. Benay Spice and had been on Revlimid and Velcade both of which have been held over the past 3 weeks. Since off chemotherapy she has had no improvement in the above symptoms. She was in the emergency room as soon as yesterday, labs were unrevealing and she was allowed discharged home. She did undergo CT of the abdomen and pelvis on 10/27/2016 with an ER visit which showed a 16.8 cm mass on her right kidney and multiple hypodense fluid attenuation masses in the left kidney which may be cysts. MRI was recommended. Patient related that she was seen by [facility]/Dr. Karsten Ro last week-14 copy of those notes. It appears that the 16 cm mass on the right kidney noted on CT scan is actually a 1.6 to by 1.24 cm lesion of the right kidney and consistent with a renal cyst by a renal ultrasound done at their office.  Previous GI evaluation with EGD about a year ago per Dr. Benson Norway for anemia with finding of  a hiatal hernia and gastric erosions. Colonoscopy in 2016 with removal of 2 polyps and left-sided diverticulosis noted  Review of Systems Pertinent positive and negative review of systems were noted in the above HPI section.  All other review of  systems was otherwise negative.  Outpatient Encounter Prescriptions as of 11/17/2016  Medication Sig  . acyclovir (ZOVIRAX) 400 MG tablet TAKE 1 TABLET (400 MG TOTAL) BY MOUTH 2 (TWO) TIMES DAILY.  Marland Kitchen amLODipine (NORVASC) 10 MG tablet Take 10 mg by mouth at bedtime.   . Ascorbic Acid (VITAMIN C) 1000 MG tablet Take 1,000 mg by mouth daily.   Marland Kitchen aspirin EC 81 MG tablet Take 81 mg by mouth daily.  . calcium carbonate (OS-CAL - DOSED IN MG OF ELEMENTAL CALCIUM) 1250 (500 Ca) MG tablet Take 1 tablet (500 mg of elemental calcium total) by mouth daily with breakfast.  . Cholecalciferol (VITAMIN D PO) Take 1 capsule by mouth daily.  Marland Kitchen dexamethasone (DECADRON) 4 MG tablet Decadron 40 mg once a week on day of Velcade injection (Patient taking differently: Take 20 mg by mouth once a week. Decadron 40 mg once a week on day of Velcade injection )  . docusate sodium (COLACE) 100 MG capsule Take 1 capsule (100 mg total) by mouth every 12 (twelve) hours.  Marland Kitchen FLUoxetine (PROZAC) 10 MG capsule Take 10 mg by mouth at bedtime.   Marland Kitchen HYDROcodone-acetaminophen (NORCO/VICODIN) 5-325 MG tablet Take 1-2 tablets by mouth every 4 (four) hours as needed for moderate pain or severe pain.  . isosorbide mononitrate (IMDUR) 60 MG 24 hr tablet Take 1 tablet (60 mg total) by mouth daily.  Marland Kitchen KLOR-CON  M10 10 MEQ tablet Take 10 mEq by mouth daily.   . ondansetron (ZOFRAN) 4 MG tablet Take 1 tablet (4 mg total) by mouth every 8 (eight) hours as needed for nausea or vomiting.  . pantoprazole (PROTONIX) 40 MG tablet Take 1 tablet (40 mg total) by mouth daily. Reported on 10/19/2015  . pravastatin (PRAVACHOL) 40 MG tablet Take 40 mg by mouth every evening.  . prochlorperazine (COMPAZINE) 5 MG tablet Take 1 tablet (5 mg total) by mouth every 6 (six) hours as needed for nausea or vomiting.  Marland Kitchen lenalidomide (REVLIMID) 15 MG capsule Take 1 capsule (15 mg total) by mouth daily. For 14 days, then 14 days off. (Patient not taking: Reported on  11/17/2016)  . [DISCONTINUED] ibuprofen (ADVIL,MOTRIN) 200 MG tablet Take 400 mg by mouth every 6 (six) hours as needed.  . [DISCONTINUED] oxyCODONE-acetaminophen (PERCOCET/ROXICET) 5-325 MG tablet Take 1-2 tablets by mouth every 4 (four) hours as needed for moderate pain or severe pain.   Facility-Administered Encounter Medications as of 11/17/2016  Medication  . ondansetron (ZOFRAN) tablet 8 mg  . ondansetron (ZOFRAN) tablet 8 mg   Allergies  Allergen Reactions  . Sulfa Antibiotics Rash   Patient Active Problem List   Diagnosis Date Noted  . Abdominal pain 11/05/2016  . Multiple myeloma ([facility]) 06/18/2016  . Malnutrition of moderate degree 06/14/2016  . 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 Exertional dyspnea 11/01/2015  . Coronary atherosclerosis of native coronary artery 11/01/2015  . Shortness of breath 11/01/2015  . Chest pain 12/31/2011   Social History   Social History  . Marital status: Single    Spouse name: N/A  . Number of children: N/A  . Years of education: N/A   Occupational History  . Not on file.   Social History Main Topics  . Smoking status: Current Every Day Smoker    Packs/day: 0.50    Years: 48.00    Types: Cigarettes  . Smokeless tobacco: Never Used  . Alcohol use No  . Drug use: No  . Sexual activity: No   Other Topics Concern  . Not on file   Social History Narrative  . No narrative on file    Abigail [name] family history is not on file.      Objective:    Vitals:   11/17/16 1030  BP: (!) 172/78  Pulse: (!) 47    Physical Exam  well-developed chronically ill-appearing African-American female, holding her abdomen and rocking, holding an emesis basin- accompanied by her sister blood pressure 172/78 pulse 47 , BMI 25. HEENT; nontraumatic normocephalic EOMI PERRLA sclera anicteric, Cardiovascular; irregular rate and rhythm with S1-S2 no murmur or gallop, Pulmonary ;clear bilaterally, Abdomen; soft, bowel  sounds are present she has a lower midline incisional scar and an appendectomy scar there is fullness in the left mid quadrant and fullness in the right mid quadrant on a she is tender in the left mid and left lower quadrant no definite mass, no palpable hepatosplenomegaly, Rectal; exam not done, Extremities; no clubbing cyanosis or edema skin warm and dry, Neuropsych ;mood and affect appropriate       Assessment & Plan:   #59 71 year old female with multiple myeloma diagnosed August 2017 who had been on Revlimid and Velcade now presenting with 3 week history of intractable nausea vomiting and left-sided abdominal pain. No improvement in symptoms off Revlimid and Velcade 3 weeks. CT of the abdomen and pelvis on 10/27/2016, with no acute process, she  does have bilateral renal cysts. \Patient has had multiple ER visits. Etiology of her symptoms is not entirely clear, certainly Revlimid and Velcade can both be associated with nausea and vomiting. Her abdominal pain is not explained. #2 weight loss secondary to above #3 coronary artery disease #4 status post appendectomy and hysterectomy # 5 anemia related to multiple myeloma  Plan; patient is not making it as an outpatient, she will be admitted to [facility], on the hospitalist service. I have spoken with Dr. Bonner Puna, who has accepted her. She needs IV fluid hydration, IV analgesics and anti-emetics MRI of the abdomen and pelvis Would involve oncology and asked Dr. Benay Spice to see her GI will also follow. If MRI is negative she may need endoscopic evaluation.    Amy S Esterwood PA-C 11/17/2016   Cc: Nolene Ebbs, MD   Addendum: Reviewed and agree with initial management. Jerene Bears, MD

## 2016-11-18 NOTE — Op Note (Signed)
[facility] Patient Name: Abigail [name] Procedure Date: 11/18/2016 MRN: [id number] Attending MD: Ladene Artist , MD Date of Birth: 11/21/1945 CSN: [id number] Age: 71 Admit Type: Inpatient Procedure:                Upper GI endoscopy Indications:              Abdominal pain in the left upper quadrant,                            Persistent vomiting Providers:                Pricilla Riffle. Fuller Plan, MD, Hilma Favors, RN, Corliss Parish, Technician Referring MD:             [facility] Medicines:                Monitored Anesthesia Care Complications:            No immediate complications. Estimated Blood Loss:     Estimated blood loss: none. Procedure:                Pre-Anesthesia Assessment:                           - Prior to the procedure, a History and Physical                            was performed, and patient medications and                            allergies were reviewed. The patient's tolerance of                            previous anesthesia was also reviewed. The risks                            and benefits of the procedure and the sedation                            options and risks were discussed with the patient.                            All questions were answered, and informed consent                            was obtained. Prior Anticoagulants: The patient has                            taken no previous anticoagulant or antiplatelet                            agents. ASA Grade Assessment: III - A patient with  severe systemic disease. After reviewing the risks                            and benefits, the patient was deemed in                            satisfactory condition to undergo the procedure.                           After obtaining informed consent, the endoscope was                            passed under direct vision. Throughout the                            procedure, the  patient's blood pressure, pulse, and                            oxygen saturations were monitored continuously. The                            [id number] scope was introduced through the                            mouth, and advanced to the second part of duodenum.                            The upper GI endoscopy was accomplished without                            difficulty. The patient tolerated the procedure                            well. Scope In: Scope Out: Findings:      LA Grade D (one or more mucosal breaks involving at least 75% of       esophageal circumference) esophagitis with no bleeding was found in the       mid esophagus.      One moderate benign-appearing, intrinsic stenosis was found at the       gastroesophageal junction. This measured 1.3 cm (inner diameter) and was       traversed.      The exam of the esophagus was otherwise normal.      A small hiatal hernia was present.      The exam of the stomach was otherwise normal.      Localized mildly erythematous mucosa without active bleeding and with no       stigmata of bleeding was found in the duodenal bulb and in the second       portion of the duodenum. Impression:               - LA Grade D reflux esophagitis.                           - Benign-appearing esophageal stenosis.                           -  Small hiatal hernia.                           - Erythematous duodenopathy.                           - No specimens collected. Moderate Sedation:      N/A- Per Anesthesia Care Recommendation:           - Return patient to hospital ward for ongoing care.                           - Full liquid diet today.                           - Protonix (pantoprazole) 40 mg PO BID for 2 months                            then 40 mg po qam.                           - Zofran (ondansetron) 4 mg PO ac and hs.                           - GI follow with Dr. Hilarie Fredrickson                           - Esophagitis could be  primary or secondary to N/V.                            No cause for abdominal pain noted.                           - Continue present medications. Procedure Code(s):        --- Professional ---                           [phone], Esophagogastroduodenoscopy, flexible,                            transoral; diagnostic, including collection of                            specimen(s) by brushing or washing, when performed                            (separate procedure) Diagnosis Code(s):        --- Professional ---                           K21.0, Gastro-esophageal reflux disease with                            esophagitis                           K22.2, Esophageal obstruction  K44.9, Diaphragmatic hernia without obstruction or                            gangrene                           K31.89, Other diseases of stomach and duodenum                           R10.12, Left upper quadrant pain                           R11.10, Vomiting, unspecified CPT copyright 2016 [employer]. All rights reserved. The codes documented in this report are preliminary and upon coder review may  be revised to meet current compliance requirements. Ladene Artist, MD 11/18/2016 11:41:07 AM This report has been signed electronically. Number of Addenda: 0

## 2016-11-18 NOTE — Transfer of Care (Signed)
Immediate Anesthesia Transfer of Care Note  Patient: [name]  Procedure(s) Performed: Procedure(s): ESOPHAGOGASTRODUODENOSCOPY (EGD) WITH PROPOFOL (N/A)  Patient Location: PACU  Anesthesia Type:MAC  Level of Consciousness: Patient easily awoken, sedated, comfortable, cooperative, following commands, responds to stimulation.   Airway & Oxygen Therapy: Patient spontaneously breathing, ventilating well, oxygen via simple oxygen mask.  Post-op Assessment: Report given to PACU RN, vital signs reviewed and stable, moving all extremities.   Post vital signs: Reviewed and stable.  Complications: No apparent anesthesia complications  Last Vitals:  Vitals:   11/18/16 0406 11/18/16 1016  BP: 133/85 (!) 166/89  Pulse: (!) 45 (!) 42  Resp: 16 11  Temp: 37 C     Last Pain:  Vitals:   11/18/16 1016  TempSrc: Oral  PainSc: 10-Worst pain ever      Patients Stated Pain Goal: 0 (15/40/08 6761)  Complications: No apparent anesthesia complications

## 2016-11-18 NOTE — Progress Notes (Signed)
PROGRESS NOTE    Abigail [name]  SXJ:[id number] DOB: 07-06-1946 DOA: 11/17/2016 PCP: Philis Fendt, MD  Brief Narrative: Abigail [name] is a 71 y.o. female with a history of hiatal hernia, gastric erosions, left-sided diverticulosis. She presents with intractable nausea, vomiting and abdominal pain. GI following and planning for EGD.    Assessment & Plan:   Principal Problem:   Intractable nausea and vomiting Active Problems:   Malnutrition of moderate degree   Multiple myeloma (HCC)   Abdominal pain   Intractable nausea and vomiting Improved today. Has a history of hiatal hernia and gastric erosions.  -Gastroenterology recommendations: EGD, NPO -Continue Zofran IV prn -continue Morphine IV prn  Thrombocytopenia Likely dilutional. Patient on Lovenox but doubt HITT. No rashes. -repeat CBC  Bilateral renal cysts MRI significant for simple cysts and two hemorrhagic vs proteinaceous cysts  Multiple myeloma S/p chemotherapy -Oncology recommendation   DVT prophylaxis: Lovenox Code Status: Full Code Family Communication: None at bedside Disposition Plan: Discharge in 1-2 days pending workup of abdominal pain and improvement of pain symptoms without IV treatment.   Consultants:   Gastroenterology  Procedures:  EGD (11/18/2016)  Antimicrobials:   None    Subjective: No nausea or vomiting but still significant pain. Improved with IV morphine. No bowel movement in 6 days.  Objective: Vitals:   11/17/16 1310 11/17/16 2009 11/18/16 0406  BP: (!) 202/73 (!) 162/79 133/85  Pulse: (!) 52 61 (!) 45  Resp: '18 20 16  ' Temp: 99.1 F (37.3 C) 99.8 F (37.7 C) 98.6 F (37 C)  TempSrc: Oral Oral Oral  SpO2: 98% 99% 100%  Weight: 72.2 kg (159 lb 2.8 oz)    Height: 5' 5.5" (1.664 m)      Intake/Output Summary (Last 24 hours) at 11/18/16 0736 Last data filed at 11/18/16 0407  Gross per 24 hour  Intake              495 ml  Output             1150 ml  Net             -655  ml   Filed Weights   11/17/16 1310  Weight: 72.2 kg (159 lb 2.8 oz)    Examination:  General exam: Appears calm and comfortable Respiratory system: Clear to auscultation. Respiratory effort normal. Cardiovascular system: S1 & S2 heard, RRR. No murmurs, rubs, gallops or clicks. Gastrointestinal system: Abdomen is nondistended, soft and tender in LLQ. Normal bowel sounds heard. Central nervous system: Alert and oriented. No focal neurological deficits. Extremities: No edema. No calf tenderness Skin: No cyanosis. No rashes Psychiatry: Judgement and insight appear normal. Mood & affect appropriate.     Data Reviewed: I have personally reviewed following labs and imaging studies  CBC:  Recent Labs Lab 11/13/16 1224 11/14/16 2257 11/16/16 1952 11/17/16 1425 11/18/16 0559  WBC 4.4 7.0 6.6 6.7 3.7*  NEUTROABS 3.8  --  4.5  --   --   HGB 13.4 12.0 13.3 13.5 10.9*  HCT 41.5 37.8 40.6 42.8 33.4*  MCV 93.5 92.2 91.9 93.7 91.8  PLT 214 178 176 169 336*   Basic Metabolic Panel:  Recent Labs Lab 11/13/16 1224 11/14/16 2257 11/16/16 1952 11/17/16 1425 11/18/16 0559  NA 142 141 137 141 143  K 4.1 3.8 4.0 3.9 3.6  CL  --  110 106 106 114*  CO2 '22 22 22 25 24  ' GLUCOSE 138 122* 108* 122* 104*  BUN 9.9  '11 11 11 8  ' CREATININE 0.9 0.78 0.89 0.89 0.78  CALCIUM 10.0 8.9 9.2 9.9 8.6*  MG  --   --   --  2.1  --    GFR: Estimated Creatinine Clearance: 66.8 mL/min (by C-G formula based on SCr of 0.78 mg/dL). Liver Function Tests:  Recent Labs Lab 11/13/16 1224 11/14/16 2257 11/16/16 1952 11/17/16 1425  AST '16 16 15 16  ' ALT 11 11* 11* 12*  ALKPHOS 111 70 79 80  BILITOT 0.51 0.7 0.8 0.7  PROT 7.4 6.2* 6.9 7.4  ALBUMIN 4.4 4.0 4.4 4.5    Recent Labs Lab 11/14/16 2257 11/16/16 1952  LIPASE 23 23   No results for input(s): AMMONIA in the last 168 hours. Coagulation Profile: No results for input(s): INR, PROTIME in the last 168 hours. Cardiac Enzymes: No results for  input(s): CKTOTAL, CKMB, CKMBINDEX, TROPONINI in the last 168 hours. BNP (last 3 results) No results for input(s): PROBNP in the last 8760 hours. HbA1C: No results for input(s): HGBA1C in the last 72 hours. CBG: No results for input(s): GLUCAP in the last 168 hours. Lipid Profile: No results for input(s): CHOL, HDL, LDLCALC, TRIG, CHOLHDL, LDLDIRECT in the last 72 hours. Thyroid Function Tests: No results for input(s): TSH, T4TOTAL, FREET4, T3FREE, THYROIDAB in the last 72 hours. Anemia Panel: No results for input(s): VITAMINB12, FOLATE, FERRITIN, TIBC, IRON, RETICCTPCT in the last 72 hours. Sepsis Labs:  Recent Labs Lab 11/13/16 1542  LATICACIDVEN 1.11    No results found for this or any previous visit (from the past 240 hour(s)).       Radiology Studies: No results found.      Scheduled Meds: . acyclovir  400 mg Oral BID  . amLODipine  10 mg Oral QHS  . aspirin EC  81 mg Oral Daily  . docusate sodium  100 mg Oral Q12H  . enoxaparin (LOVENOX) injection  40 mg Subcutaneous Q24H  . FLUoxetine  10 mg Oral QHS  . isosorbide mononitrate  60 mg Oral Daily  . pantoprazole  40 mg Oral Daily  . pravastatin  40 mg Oral QPM   Continuous Infusions: . dextrose 5 % and 0.9% NaCl 100 mL/hr at 11/18/16 0229     LOS: 1 day     Cordelia Poche [facility] 11/18/2016, 7:36 AM Pager: [phone]  If 7PM-7AM, please contact night-coverage [email or web address] Password [facility] 11/18/2016, 7:36 AM

## 2016-11-18 NOTE — Evaluation (Signed)
Physical Therapy Evaluation-1x eval  Patient Details Name: Abigail [name] MRN: [id number] DOB: [birth year] Today's Date: 11/18/2016   History of Present Illness  71 yo female admitted with intractable nausea, vomiting, abd pain. Hx of multiple myeloma-undergoing chemo, MI, CAd, anemia.   Clinical Impression  On eval, pt was Sup-Mod Ind level for mobility. She walked ~200 feet while using hallway handrail intermittently. No overt LOB during ambulation however she was mildly unsteady at times. Do not anticipate any follow up PT needs at this time. Recommend daily ambulation in the hallway with nursing supervision. Will sign off.     Follow Up Recommendations No PT follow up    Equipment Recommendations  None recommended by PT (pt reports she has a walker at home. )    Recommendations for Other Services       Precautions / Restrictions Precautions Precautions: None Restrictions Weight Bearing Restrictions: No      Mobility  Bed Mobility Overal bed mobility: Modified Independent                Transfers Overall transfer level: Modified independent                  Ambulation/Gait Ambulation/Gait assistance: Supervision Ambulation Distance (Feet): 200 Feet Assistive device: None (hallway handrail intermittently) Gait Pattern/deviations: Step-through pattern;Decreased stride length     General Gait Details: supervision for safety. Pt reports she had pain meds prior to MRI and they make her a little dizzy. No LOB during session. She did use the handrail at times.   Stairs            Wheelchair Mobility    Modified Rankin (Stroke Patients Only)       Balance             Standing balance-Leahy Scale: Good                               Pertinent Vitals/Pain Pain Assessment: Faces Faces Pain Scale: Hurts even more Pain Location: abdomen Pain Descriptors / Indicators: Sore;Aching Pain Intervention(s): Monitored during session    Home  Living Family/patient expects to be discharged to:: Private residence Living Arrangements: Alone Available Help at Discharge: Family;Available PRN/intermittently Type of Home: House Home Access: Stairs to enter Entrance Stairs-Rails: None Entrance Stairs-Number of Steps: 2 Home Layout: One level Home Equipment: Environmental consultant - 2 wheels      Prior Function Level of Independence: Independent               Hand Dominance        Extremity/Trunk Assessment   Upper Extremity Assessment Upper Extremity Assessment: Overall WFL for tasks assessed    Lower Extremity Assessment Lower Extremity Assessment: Generalized weakness    Cervical / Trunk Assessment Cervical / Trunk Assessment: Normal  Communication   Communication: No difficulties  Cognition Arousal/Alertness: Awake/alert Behavior During Therapy: WFL for tasks assessed/performed Overall Cognitive Status: Within Functional Limits for tasks assessed                      General Comments      Exercises     Assessment/Plan    PT Assessment Patent does not need any further PT services  PT Problem List            PT Treatment Interventions      PT Goals (Current goals can be found in the Care Plan section)  Acute Rehab  PT Goals Patient Stated Goal: to figure out whats going on PT Goal Formulation: All assessment and education complete, DC therapy    Frequency     Barriers to discharge        Co-evaluation               End of Session Equipment Utilized During Treatment: Gait belt Activity Tolerance: Patient tolerated treatment well Patient left: in bed;with call bell/phone within reach      Functional Assessment Tool Used: clinical judgement Functional Limitation: Mobility: Walking and moving around Mobility: Walking and Moving Around Current Status ([id number]): At least 1 percent but less than 20 percent impaired, limited or restricted Mobility: Walking and Moving Around Goal Status  [phone]): At least 1 percent but less than 20 percent impaired, limited or restricted Mobility: Walking and Moving Around Discharge Status [phone]): At least 1 percent but less than 20 percent impaired, limited or restricted    Time: 0258-5277 PT Time Calculation (min) (ACUTE ONLY): 8 min   Charges:   PT Evaluation $PT Eval Low Complexity: 1 Procedure     PT G Codes:   PT G-Codes **NOT FOR INPATIENT CLASS** Functional Assessment Tool Used: clinical judgement Functional Limitation: Mobility: Walking and moving around Mobility: Walking and Moving Around Current Status ([id number]): At least 1 percent but less than 20 percent impaired, limited or restricted Mobility: Walking and Moving Around Goal Status [phone]): At least 1 percent but less than 20 percent impaired, limited or restricted Mobility: Walking and Moving Around Discharge Status [phone]): At least 1 percent but less than 20 percent impaired, limited or restricted    Weston Anna, MPT Pager: [phone]

## 2016-11-18 NOTE — Anesthesia Postprocedure Evaluation (Addendum)
Anesthesia Post Note  Patient: Abigail [name]  Procedure(s) Performed: Procedure(s) (LRB): ESOPHAGOGASTRODUODENOSCOPY (EGD) WITH PROPOFOL (N/A)  Patient location during evaluation: PACU Anesthesia Type: MAC Level of consciousness: awake and alert Pain management: pain level controlled Vital Signs Assessment: post-procedure vital signs reviewed and stable Respiratory status: spontaneous breathing, nonlabored ventilation, respiratory function stable and patient connected to nasal cannula oxygen Cardiovascular status: stable and blood pressure returned to baseline Anesthetic complications: no       Last Vitals:  Vitals:   11/18/16 0406 11/18/16 1016  BP: 133/85 (!) 166/89  Pulse: (!) 45 (!) 42  Resp: 16 11  Temp: 37 C     Last Pain:  Vitals:   11/18/16 1016  TempSrc: Oral  PainSc: 10-Worst pain ever                 Sumeet Geter S

## 2016-11-18 NOTE — Progress Notes (Signed)
IP PROGRESS NOTE  Subjective:   Abigail [name] is well-known to me with a history of multiple myeloma. Treatment for the myeloma has been placed on hold for the past several weeks secondary to multiple complaints including urinary retention, abdominal pain, and nausea/vomiting. We saw her 11/13/2016 and she complained of severe pain in the left lower lateral abdomen. She was referred to the emergency room. She was discharged from the emergency room on 11/13/2016 and again on 11/14/2016. She was seen by gastroenterology yesterday and admitted for evaluation of persistent nausea/vomiting and left abdominal pain. She reports the pain persist. Objective: Vital signs in last 24 hours: Blood pressure 124/71, pulse 71, temperature 97.9 F (36.6 C), temperature source Oral, resp. rate 10, height 5' 5.5" (1.664 m), weight 159 lb (72.1 kg), SpO2 100 %.  Intake/Output from previous day: 01/29 0701 - 01/30 0700 In: 1330 [I.V.:1330] Out: 1150 [Urine:1150]  Physical Exam:  HEENT: No thrush Lungs: Good air movement bilaterally, no respiratory distress Cardiac: Regular rate and rhythm Abdomen: No hepatosplenomegaly, no mass, soft, tender in the left mid lateral abdomen between the lower ribs and iliac Extremities: No leg edema    Lab Results:  Recent Labs  11/17/16 1425 11/18/16 0559  WBC 6.7 3.7*  HGB 13.5 10.9*  HCT 42.8 33.4*  PLT 169 131*    BMET  Recent Labs  11/17/16 1425 11/18/16 0559  NA 141 143  K 3.9 3.6  CL 106 114*  CO2 25 24  GLUCOSE 122* 104*  BUN 11 8  CREATININE 0.89 0.78  CALCIUM 9.9 8.6*    Studies/Results: Mr Abdomen W Wo Contrast  Result Date: 11/18/2016 CLINICAL DATA:  Evaluate right renal lesion seen on recent CT and ultrasound examinations. EXAM: MRI ABDOMEN WITHOUT AND WITH CONTRAST TECHNIQUE: Multiplanar multisequence MR imaging of the abdomen was performed both before and after the administration of intravenous contrast. CONTRAST:  33m MULTIHANCE  GADOBENATE DIMEGLUMINE 529 MG/ML IV SOLN COMPARISON:  CT scan 10/27/2016 and ultrasound 11/10/2016 FINDINGS: Lower chest: The lung bases are grossly clear. No worrisome pulmonary lesions. No pleural or pericardial effusion. Hepatobiliary: No focal hepatic lesions or intrahepatic biliary dilatation. The gallbladder is normal. No common bile duct dilatation. The portal and hepatic veins are normal. Pancreas:  No mass, inflammation or ductal dilatation. Spleen:  Normal size.  No focal lesions. Adrenals/Urinary Tract:  The adrenal glands are unremarkable. There are multiple small bilateral renal cysts. The lower pole right renal lesion demonstrates increased T1 signal intensity and no contrast enhancement. This is consistent with a benign hemorrhagic or proteinaceous cyst. The small interpolar lesion involving the left kidney is also a small hemorrhagic or proteinaceous cyst. No worrisome renal lesions. No hydronephrosis. Stomach/Bowel: Visualized portions within the abdomen are unremarkable. Vascular/Lymphatic: Normal caliber aorta. Moderate atherosclerotic calcifications. Calcifications noted at the major branch vessel ostia. Bilateral renal artery stents are noted. No aneurysm or dissection. The major venous structures are patent. No mesenteric or retroperitoneal mass or adenopathy. Other: No ascites or abdominal wall hernia. No subcutaneous lesions. Musculoskeletal: Small scattered hemangiomas are noted. IMPRESSION: 1. Multiple bilateral renal cysts. Most of these are simple cysts. The 2 lesions in question on the CT scan are both hemorrhagic or proteinaceous cysts. No worrisome contrast enhancement. 2. No acute abdominal findings, mass lesions or adenopathy. Electronically Signed   By: PMarijo SanesM.D.   On: 11/18/2016 10:07    Medications: I have reviewed the patient's current medications.  Assessment/Plan: 1. Multiple myeloma, IgG lambda, bone marrow biopsy  06/15/2016 confirmed multiple myeloma;  cytogenetics by FISHshow +11, +12, 13q-  Elevated serum free lambda light chains  Lambda light chain proteinuria  Lytic bone lesions on a bone survey 06/13/2016  Initiation of weekly Velcade/Decadron 06/19/2016  Serum light chains improved 07/31/2016  Initiation of Revlimid 11/03/20172 weeks on/2 weeks off  Serum M spike and IgG significantly improved 09/25/2016  Velcade held on 10/30/2016 due to urinary retention  2. Severe anemia secondary to #1-markedly improved  3. Diffuse lytic bone lesions secondary to multiple myeloma, status post Zometa 09/18/2016 (plan to continue every 3 months)  4. History of coronary artery disease/myocardial infarction  5. History of colon polyps  6. Bilateral leg edema 09/04/2016. Negative bilateral venous Doppler 09/05/2016.  7. Mild periorbital edema 09/18/2016, question related to early stye formation, question Velcade related chalazia. Improved.  8. CT scan 10/27/2016 with a new right kidney evaluated by urology  9. Urinary retention 10/28/2016 status post evaluation in the emergency department. Urinalysis negative for signs of infection 10/30/2016. Velcade held. Resolved.  Ms. Stare has persistent abdominal pain and nausea. The etiology remains unclear. I cannot relate her symptoms to the diagnosis of multiple myeloma. All markers of the myeloma have improved significantly since she was placed on systemic therapy last year. She has not required a red cell transfusion since August 2017.  Treatment for myeloma has been placed on hold for the past several weeks. Her current symptoms should not be related to Velcade or Revlimid. I doubt her pain is related to Decadron unless this is an atypical presentation of peptic ulcer disease.  The plan is to resume systemic treatment for myeloma when her pain is further evaluated and improved.  Recommendations: 1. Continue to keep Revlimid, Velcade, and Decadron on hold 2. Evaluate the abdominal  pain as recommended by gastroenterology 3. Please call Oncology as needed. I will schedule outpatient follow-up.    LOS: 1 day   [name], MD   11/18/2016, 2:15 PM

## 2016-11-18 NOTE — Interval H&P Note (Signed)
History and Physical Interval Note:  11/18/2016 11:05 AM  Abigail [name]  has presented today for surgery, with the diagnosis of intractable nausea and vomiting  The various methods of treatment have been discussed with the patient and family. After consideration of risks, benefits and other options for treatment, the patient has consented to  Procedure(s): ESOPHAGOGASTRODUODENOSCOPY (EGD) WITH PROPOFOL (N/A) as a surgical intervention .  The patient's history has been reviewed, patient examined, no change in status, stable for surgery.  I have reviewed the patient's chart and labs.  Questions were answered to the patient's satisfaction.     Pricilla Riffle. Fuller Plan

## 2016-11-19 ENCOUNTER — Inpatient Hospital Stay (HOSPITAL_COMMUNITY): Payer: Medicare HMO

## 2016-11-19 ENCOUNTER — Encounter (HOSPITAL_COMMUNITY): Payer: Self-pay | Admitting: Gastroenterology

## 2016-11-19 DIAGNOSIS — K5900 Constipation, unspecified: Secondary | ICD-10-CM

## 2016-11-19 DIAGNOSIS — E43 Unspecified severe protein-calorie malnutrition: Secondary | ICD-10-CM | POA: Insufficient documentation

## 2016-11-19 DIAGNOSIS — R1032 Left lower quadrant pain: Secondary | ICD-10-CM

## 2016-11-19 LAB — CBC
HEMATOCRIT: 32.6 % — AB (ref 36.0–46.0)
HEMOGLOBIN: 10.3 g/dL — AB (ref 12.0–15.0)
MCH: 29.9 pg (ref 26.0–34.0)
MCHC: 31.6 g/dL (ref 30.0–36.0)
MCV: 94.8 fL (ref 78.0–100.0)
Platelets: 124 10*3/uL — ABNORMAL LOW (ref 150–400)
RBC: 3.44 MIL/uL — AB (ref 3.87–5.11)
RDW: 16.3 % — ABNORMAL HIGH (ref 11.5–15.5)
WBC: 3.2 10*3/uL — ABNORMAL LOW (ref 4.0–10.5)

## 2016-11-19 MED ORDER — IOPAMIDOL (ISOVUE-300) INJECTION 61%
INTRAVENOUS | Status: AC
  Administered 2016-11-19: 15 mL
  Filled 2016-11-19: qty 30

## 2016-11-19 MED ORDER — PEG-KCL-NACL-NASULF-NA ASC-C 100 G PO SOLR
0.5000 | Freq: Once | ORAL | Status: AC
  Administered 2016-11-19: 100 g via ORAL

## 2016-11-19 MED ORDER — IOPAMIDOL (ISOVUE-300) INJECTION 61%
30.0000 mL | Freq: Once | INTRAVENOUS | Status: AC
  Administered 2016-11-19: 30 mL via ORAL

## 2016-11-19 MED ORDER — MORPHINE SULFATE (PF) 2 MG/ML IV SOLN
2.0000 mg | Freq: Once | INTRAVENOUS | Status: AC
  Administered 2016-11-19: 2 mg via INTRAVENOUS
  Filled 2016-11-19: qty 1

## 2016-11-19 MED ORDER — SODIUM CHLORIDE 0.9 % IJ SOLN
INTRAMUSCULAR | Status: AC
  Filled 2016-11-19: qty 50

## 2016-11-19 MED ORDER — IOPAMIDOL (ISOVUE-300) INJECTION 61%
100.0000 mL | Freq: Once | INTRAVENOUS | Status: AC | PRN
  Administered 2016-11-19: 100 mL via INTRAVENOUS

## 2016-11-19 MED ORDER — IOPAMIDOL (ISOVUE-370) INJECTION 76%
INTRAVENOUS | Status: AC
  Filled 2016-11-19: qty 100

## 2016-11-19 MED ORDER — PEG-KCL-NACL-NASULF-NA ASC-C 100 G PO SOLR
0.5000 | Freq: Once | ORAL | Status: AC
  Administered 2016-11-19: 100 g via ORAL
  Filled 2016-11-19: qty 1

## 2016-11-19 MED ORDER — PEG-KCL-NACL-NASULF-NA ASC-C 100 G PO SOLR: 1.0000 | Freq: Once | ORAL | Status: DC

## 2016-11-19 MED ORDER — POLYETHYLENE GLYCOL 3350 17 G PO PACK
17.0000 g | PACK | Freq: Two times a day (BID) | ORAL | Status: DC
  Administered 2016-11-19 – 2016-11-21 (×2): 17 g via ORAL
  Filled 2016-11-19 (×3): qty 1

## 2016-11-19 NOTE — Progress Notes (Signed)
Initial Nutrition Assessment  DOCUMENTATION CODES:   Severe malnutrition in context of chronic illness  INTERVENTION:  -RD to continue to monitor for diet advancement -Ensure Enlive TID once diet advances, each supplement provides 350 calories and 20 grams of protein  NUTRITION DIAGNOSIS:   Malnutrition related to chronic illness as evidenced by energy intake < 75% for > or equal to 1 month, moderate depletions of muscle mass, moderate depletion of body fat, percent weight loss (15% in 2 mo).  GOAL:   Patient will meet greater than or equal to 90% of their needs  MONITOR:   PO intake, Diet advancement, Weight trends, I & O's, Supplement acceptance  REASON FOR ASSESSMENT:   Malnutrition Screening Tool    ASSESSMENT:   71 yo female admitted with intractable nausea, vomiting, abd pain. Hx of multiple myeloma-undergoing chemo, MI, CAd, anemia.   Pt reports decrease in appetite from nausea and vomiting exaggerated by current abdominal pain. Pt reports N/V have subsided but abdominal pain persists.   Per pt diet recall her dinner last night consisted of potato soup and ice cream. She reported no consumption today except water.   Pt reports a fluctuation in meal consumption PTA dependant on N/V. PTA pt reports consuming a soft/liquid diet consisting of jello, applesauce, chicken noodle soup, and ensure. Pt reports consuming 1-3 ensures/d, this equates to a range of [phone] calories/d and 20-60 grams protein/d. Pt enjoys Ensure and is willing to continue dietary supplementation once diet advances, prefers chocolate.  Pt reports taste changes associated with treatments. States "some foods I use to love now have a copper taste" referring to scrambled eggs and liver pudding. To assist with metallic taste, suggested utilization of plastic ware instead of metal utensils. At follow-up, when diet is advanced, will discuss other strategies for taste changes.    Pt reports recent weight loss.  Pt reports UBW (early 2017) is around 217 lb. Weight history shows a fluctuation of weights but on 11/30 weighed 188 lb, this is a 15 % wt loss to current weight of 159 lb (significant to time frame).  Per nutrition focused physical exam pt showed mild/moderate muscle depletion, mild fat depletion and no edema.  Labs reviewed;  Medications reviewed;  Colace 100 mg, Zofran 4 mg, Protonix 40 mg  Diet Order:  Diet clear liquid Room service appropriate? Yes; Fluid consistency: Thin  Skin:  Reviewed, no issues  Last BM:  1/24  Height:   Ht Readings from Last 1 Encounters:  11/18/16 5' 5.5" (1.664 m)    Weight:   Wt Readings from Last 1 Encounters:  11/18/16 159 lb (72.1 kg)    Ideal Body Weight:  58 kg  BMI:  Body mass index is 26.06 kg/m.  Estimated Nutritional Needs:   Kcal:  2100-2300 (29-32 kcal/kg)  Protein:  110-120 grams (1.5-1.7g/kg)  Fluid:  >/= 2 L/d  EDUCATION NEEDS:   Education needs no appropriate at this time  [employer]

## 2016-11-19 NOTE — Progress Notes (Addendum)
PROGRESS NOTE    Abigail [name]   ENM:[id number]  DOB: 1946/04/07  DOA: 11/17/2016 PCP: Philis Fendt, MD   Brief Narrative:  Abigail [name] is a 71 y.o. female with a history of Multiple Myeloma, hiatal hernia, gastric erosions & left-sided diverticulosis. She presents with intractable nausea, vomiting and LLQ abdominal pain (for > 1 wk).    Subjective: Pain in LLQ was severe today- has received Morphine- no nausea or vomiting today.   Assessment & Plan:  Intractable nausea and vomiting - Improved  - EGD shows reflux esophagitis, benign esophageal stricture, small hiatal hernia with erythematous duodenopathy - bid PPI  - advancing diet  LLQ pain - this is the reason she presented to the hospital - she describe it as a severe and constant pain - MRA unrevealing - CT with contrast also unrevealing - GI giving her a bowel prep for constipation- follow   Thrombocytopenia Likely dilutional. Patient on Lovenox but doubt HITT. No rashes. -repeat CBC  Bilateral renal cysts MRI significant for simple cysts and two hemorrhagic vs proteinaceous cysts  Multiple myeloma with acute pancytopenia  - with lytic lesions in spine noted on CT- Zometal 11/17 and Q 3 months - oncology recommends holding Revlimid, Velcade, Decadron for now (holding for several weeks now) - follow CBC for pancytopenia  DVT prophylaxis: Lovenox Code Status: Full code Family Communication:  Disposition Plan: home when stable Consultants:   GI Procedures:   EGD Antimicrobials:  Anti-infectives    Start     Dose/Rate Route Frequency Ordered Stop   11/17/16 2200  acyclovir (ZOVIRAX) tablet 400 mg     400 mg Oral 2 times daily 11/17/16 1404         Objective: Vitals:   11/18/16 1400 11/18/16 2108 11/19/16 0420 11/19/16 1451  BP: 124/71 90/76 133/65 (!) 175/79  Pulse: 71 (!) 57 (!) 58 (!) 57  Resp: _0 Temp: 97.9 F (36.6 C) 99.2 F (37.3 C) 99.1 F (37.3 C) 99.6 F (37.6 C)  TempSrc: Oral  Oral Oral Oral  SpO2: 100% 100% 99% 100%  Weight:      Height:        Intake/Output Summary (Last 24 hours) at 11/19/16 1645 Last data filed at 11/19/16 0500  Gross per 24 hour  Intake             2560 ml  Output              750 ml  Net             1810 ml   Filed Weights   11/17/16 1310 11/18/16 1016  Weight: 72.2 kg (159 lb 2.8 oz) 72.1 kg (159 lb)    Examination: General exam: Appears comfortable  HEENT: PERRLA, oral mucosa moist, no sclera icterus or thrush Respiratory system: Clear to auscultation. Respiratory effort normal. Cardiovascular system: S1 & S2 heard, RRR.  No murmurs  Gastrointestinal system: Abdomen soft, tender in LLQ, nondistended. Normal bowel sound. No organomegaly Central nervous system: Alert and oriented. No focal neurological deficits. Extremities: No cyanosis, clubbing or edema Skin: No rashes or ulcers Psychiatry:  Mood & affect appropriate.     Data Reviewed: I have personally reviewed following labs and imaging studies  CBC:  Recent Labs Lab 11/13/16 1224 11/14/16 2257 11/16/16 1952 11/17/16 1425 11/18/16 0559 11/19/16 0406  WBC 4.4 7.0 6.6 6.7 3.7* 3.2*  NEUTROABS 3.8  --  4.5  --   --   --  HGB 13.4 12.0 13.3 13.5 10.9* 10.3*  HCT 41.5 37.8 40.6 42.8 33.4* 32.6*  MCV 93.5 92.2 91.9 93.7 91.8 94.8  PLT 214 178 176 169 131* 297*   Basic Metabolic Panel:  Recent Labs Lab 11/13/16 1224 11/14/16 2257 11/16/16 1952 11/17/16 1425 11/18/16 0559  NA 142 141 137 141 143  K 4.1 3.8 4.0 3.9 3.6  CL  --  110 106 106 114*  CO2 _0 GLUCOSE 138 122* 108* 122* 104*  BUN 9._1 CREATININE 0.9 0.78 0.89 0.89 0.78  CALCIUM 10.0 8.9 9.2 9.9 8.6*  MG  --   --   --  2.1  --    GFR: Estimated Creatinine Clearance: 66.8 mL/min (by C-G formula based on SCr of 0.78 mg/dL). Liver Function Tests:  Recent Labs Lab 11/13/16 1224 11/14/16 2257 11/16/16 1952 11/17/16 1425  AST _2 ALT 11 11* 11* 12*    ALKPHOS 111 70 79 80  BILITOT 0.51 0.7 0.8 0.7  PROT 7.4 6.2* 6.9 7.4  ALBUMIN 4.4 4.0 4.4 4.5    Recent Labs Lab 11/14/16 2257 11/16/16 1952  LIPASE 23 23   No results for input(s): AMMONIA in the last 168 hours. Coagulation Profile: No results for input(s): INR, PROTIME in the last 168 hours. Cardiac Enzymes: No results for input(s): CKTOTAL, CKMB, CKMBINDEX, TROPONINI in the last 168 hours. BNP (last 3 results) No results for input(s): PROBNP in the last 8760 hours. HbA1C: No results for input(s): HGBA1C in the last 72 hours. CBG: No results for input(s): GLUCAP in the last 168 hours. Lipid Profile: No results for input(s): CHOL, HDL, LDLCALC, TRIG, CHOLHDL, LDLDIRECT in the last 72 hours. Thyroid Function Tests: No results for input(s): TSH, T4TOTAL, FREET4, T3FREE, THYROIDAB in the last 72 hours. Anemia Panel: No results for input(s): VITAMINB12, FOLATE, FERRITIN, TIBC, IRON, RETICCTPCT in the last 72 hours. Urine analysis:    Component Value Date/Time   COLORURINE STRAW (A) 11/17/2016 1828   APPEARANCEUR CLEAR 11/17/2016 1828   LABSPEC 1.010 11/17/2016 1828   LABSPEC 1.010 10/30/2016 1522   PHURINE 7.0 11/17/2016 1828   GLUCOSEU NEGATIVE 11/17/2016 1828   GLUCOSEU Negative 10/30/2016 1522   HGBUR NEGATIVE 11/17/2016 1828   BILIRUBINUR NEGATIVE 11/17/2016 1828   BILIRUBINUR Negative 10/30/2016 1522   KETONESUR 5 (A) 11/17/2016 1828   PROTEINUR 100 (A) 11/17/2016 1828   UROBILINOGEN 0.2 10/30/2016 1522   NITRITE NEGATIVE 11/17/2016 1828   LEUKOCYTESUR NEGATIVE 11/17/2016 1828   LEUKOCYTESUR Trace 10/30/2016 1522   Sepsis Labs: _3 (procalcitonin:4,lacticidven:4) )No results found for this or any previous visit (from the past 240 hour(s)).       Radiology Studies: Mr Abdomen W Wo Contrast  Result Date: 11/18/2016 CLINICAL DATA:  Evaluate right renal lesion seen on recent CT and ultrasound examinations. EXAM: MRI ABDOMEN WITHOUT AND WITH CONTRAST  TECHNIQUE: Multiplanar multisequence MR imaging of the abdomen was performed both before and after the administration of intravenous contrast. CONTRAST:  51m MULTIHANCE GADOBENATE DIMEGLUMINE 529 MG/ML IV SOLN COMPARISON:  CT scan 10/27/2016 and ultrasound 11/10/2016 FINDINGS: Lower chest: The lung bases are grossly clear. No worrisome pulmonary lesions. No pleural or pericardial effusion. Hepatobiliary: No focal hepatic lesions or intrahepatic biliary dilatation. The gallbladder is normal. No common bile duct dilatation. The portal and hepatic veins are normal. Pancreas:  No mass, inflammation or ductal dilatation. Spleen:  Normal size.  No focal lesions. Adrenals/Urinary Tract:  The adrenal  glands are unremarkable. There are multiple small bilateral renal cysts. The lower pole right renal lesion demonstrates increased T1 signal intensity and no contrast enhancement. This is consistent with a benign hemorrhagic or proteinaceous cyst. The small interpolar lesion involving the left kidney is also a small hemorrhagic or proteinaceous cyst. No worrisome renal lesions. No hydronephrosis. Stomach/Bowel: Visualized portions within the abdomen are unremarkable. Vascular/Lymphatic: Normal caliber aorta. Moderate atherosclerotic calcifications. Calcifications noted at the major branch vessel ostia. Bilateral renal artery stents are noted. No aneurysm or dissection. The major venous structures are patent. No mesenteric or retroperitoneal mass or adenopathy. Other: No ascites or abdominal wall hernia. No subcutaneous lesions. Musculoskeletal: Small scattered hemangiomas are noted. IMPRESSION: 1. Multiple bilateral renal cysts. Most of these are simple cysts. The 2 lesions in question on the CT scan are both hemorrhagic or proteinaceous cysts. No worrisome contrast enhancement. 2. No acute abdominal findings, mass lesions or adenopathy. Electronically Signed   By: Marijo Sanes M.D.   On: 11/18/2016 10:07   Ct Abdomen Pelvis  W Contrast  Result Date: 11/19/2016 CLINICAL DATA:  Left lower quadrant pain.  Nausea and vomiting. EXAM: CT ABDOMEN AND PELVIS WITH CONTRAST TECHNIQUE: Multidetector CT imaging of the abdomen and pelvis was performed using the standard protocol following bolus administration of intravenous contrast. CONTRAST:  134m ISOVUE-300 IOPAMIDOL (ISOVUE-300) INJECTION 61% COMPARISON:  Abdominal MRI 11/18/2016. CT abdomen and pelvis 10/27/2016. FINDINGS: Lower chest: Subsegmental atelectasis in the lung bases. At most trace pleural fluid bilaterally. Partially visualize coronary artery atherosclerosis. Hepatobiliary: No focal liver abnormality is seen. No gallstones, gallbladder wall thickening, or biliary dilatation. Pancreas: Unremarkable. Spleen: Unremarkable. Adrenals/Urinary Tract: Unremarkable adrenal glands. Small bilateral renal lesions more fully characterized on yesterday's MRI. No hydronephrosis. No renal calculi. Unremarkable bladder. Stomach/Bowel: Small sliding hiatal hernia. No evidence of bowel obstruction. Scattered left-sided colonic diverticula without evidence of diverticulitis. Prior appendectomy. Vascular/Lymphatic: Abdominal aortic atherosclerosis without aneurysm. Bilateral renal stents which are grossly patent. No enlarged lymph nodes in the abdomen or pelvis. Reproductive: Status post hysterectomy. No adnexal masses. Other: No intraperitoneal free fluid. No abdominal wall mass or hernia. Musculoskeletal: 1.6 cm lytic lesion in the right aspect of the T12 vertebral body, enlarged from 07/24/2015 an new from 12/31/2011. Unchanged L1 vertebral body hemangioma. Grade 1 retrolisthesis of T12 on L1 and L1 on L2. Multilevel disc degeneration, advanced at T12-L1 and L5-S1. IMPRESSION: 1. No acute abnormality identified in the abdomen or pelvis. 2. 1.6 cm T12 vertebral body lesion, enlarged from 2016. This likely reflects patient's known multiple myeloma, although a metastasis from a non-hematologic  malignancy is also possible. 3. Aortic atherosclerosis. Electronically Signed   By: ALogan BoresM.D.   On: 11/19/2016 15:05      Scheduled Meds: . acyclovir  400 mg Oral BID  . amLODipine  10 mg Oral QHS  . aspirin EC  81 mg Oral Daily  . docusate sodium  100 mg Oral Q12H  . enoxaparin (LOVENOX) injection  40 mg Subcutaneous Q24H  . FLUoxetine  10 mg Oral QHS  . iopamidol      . isosorbide mononitrate  60 mg Oral Daily  . ondansetron  4 mg Oral TID AC & HS  . pantoprazole  40 mg Oral BID AC  . peg 3350 powder  0.5 kit Oral Once   And  . peg 3350 powder  0.5 kit Oral Once  . polyethylene glycol  17 g Oral BID  . pravastatin  40 mg Oral QPM  .  sodium chloride       Continuous Infusions: . dextrose 5 % and 0.9% NaCl 100 mL/hr at 11/19/16 0140     LOS: 2 days    Time spent in minutes: 11    Osborn, MD [facility] Pager: [email or web address] Password [email or web address] 11/19/2016, 4:45 PM

## 2016-11-19 NOTE — Progress Notes (Signed)
Progress Note   Subjective  Chief Complaint:Intractable nausea and vomiting, left lower quadrant abdominal pain  Today, the patient is found sitting up in bed. She tells me that she continues with a left lower quadrant abdominal pain which started early this morning and is rated about a 10 /10, patient tells me that this pain is more of a "cramp" in nature. This has been chronic for her and one of the reasons that she presented to the hospital. She tells me that she hasn't had a bowel movement since last Wednesday but she has been on a mostly "clear liquid diet". Patient does use a combination of Dulcolax and Colace at home. She tells me the nausea is somewhat better and she only had 1-2 episodes of vomiting only a small amount of liquid yesterday which is a great improvement for her. Her largest complaint this morning is her left lower quadrant abdominal pain.   Objective   Vital signs in last 24 hours: Temp:  [97.9 F (36.6 C)-99.2 F (37.3 C)] 99.1 F (37.3 C) (01/31 0420) Pulse Rate:  [57-71] 58 (01/31 0420) Resp:  [10-18] 18 (01/31 0420) BP: (90-133)/(65-76) 133/65 (01/31 0420) SpO2:  [99 %-100 %] 99 % (01/31 0420) Last BM Date: 11/12/16 (not eating much) General:  African American female in NAD Heart:  Regular rate and rhythm; no murmurs Lungs: Respirations even and unlabored, lungs CTA bilaterally Abdomen:  Soft, marked LLQ ttp and nondistended.decreased BS all four quadrants Extremities:  Without edema. Neurologic:  Alert and oriented,  grossly normal neurologically. Psych:  Cooperative. Normal mood and affect.  Intake/Output from previous day: 01/30 0701 - 01/31 0700 In: 3200 [P.O.:600; I.V.:2600] Out: 1000 [Urine:1000]  Lab Results:  Recent Labs  11/17/16 1425 11/18/16 0559 11/19/16 0406  WBC 6.7 3.7* 3.2*  HGB 13.5 10.9* 10.3*  HCT 42.8 33.4* 32.6*  PLT 169 131* 124*   BMET  Recent Labs  11/16/16 1952 11/17/16 1425 11/18/16 0559  NA 137 141 143  K  4.0 3.9 3.6  CL 106 106 114*  CO2 _0 GLUCOSE 108* 122* 104*  BUN _1 CREATININE 0.89 0.89 0.78  CALCIUM 9.2 9.9 8.6*   LFT  Recent Labs  11/17/16 1425  PROT 7.4  ALBUMIN 4.5  AST 16  ALT 12*  ALKPHOS 80  BILITOT 0.7   PT/INR No results for input(s): LABPROT, INR in the last 72 hours.  Studies/Results: Mr Abdomen W Wo Contrast  Result Date: 11/18/2016 CLINICAL DATA:  Evaluate right renal lesion seen on recent CT and ultrasound examinations. EXAM: MRI ABDOMEN WITHOUT AND WITH CONTRAST TECHNIQUE: Multiplanar multisequence MR imaging of the abdomen was performed both before and after the administration of intravenous contrast. CONTRAST:  61m MULTIHANCE GADOBENATE DIMEGLUMINE 529 MG/ML IV SOLN COMPARISON:  CT scan 10/27/2016 and ultrasound 11/10/2016 FINDINGS: Lower chest: The lung bases are grossly clear. No worrisome pulmonary lesions. No pleural or pericardial effusion. Hepatobiliary: No focal hepatic lesions or intrahepatic biliary dilatation. The gallbladder is normal. No common bile duct dilatation. The portal and hepatic veins are normal. Pancreas:  No mass, inflammation or ductal dilatation. Spleen:  Normal size.  No focal lesions. Adrenals/Urinary Tract:  The adrenal glands are unremarkable. There are multiple small bilateral renal cysts. The lower pole right renal lesion demonstrates increased T1 signal intensity and no contrast enhancement. This is consistent with a benign hemorrhagic or proteinaceous cyst. The small interpolar lesion involving the left kidney is also a small hemorrhagic  or proteinaceous cyst. No worrisome renal lesions. No hydronephrosis. Stomach/Bowel: Visualized portions within the abdomen are unremarkable. Vascular/Lymphatic: Normal caliber aorta. Moderate atherosclerotic calcifications. Calcifications noted at the major branch vessel ostia. Bilateral renal artery stents are noted. No aneurysm or dissection. The major venous structures are patent. No  mesenteric or retroperitoneal mass or adenopathy. Other: No ascites or abdominal wall hernia. No subcutaneous lesions. Musculoskeletal: Small scattered hemangiomas are noted. IMPRESSION: 1. Multiple bilateral renal cysts. Most of these are simple cysts. The 2 lesions in question on the CT scan are both hemorrhagic or proteinaceous cysts. No worrisome contrast enhancement. 2. No acute abdominal findings, mass lesions or adenopathy. Electronically Signed   By: Marijo Sanes M.D.   On: 11/18/2016 10:07    EGD-11/08/16-Dr. Fuller Plan  Impression:               - LA Grade D reflux esophagitis.                           - Benign-appearing esophageal stenosis.                           - Small hiatal hernia.                           - Erythematous duodenopathy.                           - No specimens collected. Moderate Sedation:      N/A- Per Anesthesia Care Recommendation:           - Return patient to hospital ward for ongoing care.                           - Full liquid diet today.                           - Protonix (pantoprazole) 40 mg PO BID for 2 months                            then 40 mg po qam.                           - Zofran (ondansetron) 4 mg PO ac and hs.                           - GI follow with Dr. Hilarie Fredrickson                           - Esophagitis could be primary or secondary to N/V.                            No cause for abdominal pain noted.                           - Continue present medications.   Assessment / Plan:    Assessment: 1. Intractable nausea and vomiting: Some better since time of admission, now on Pantoprazole 40 twice a day and Zofran 4 mg before meals  and at bedtime, EGD as above with esophagitis and erythematous duodenopathy 2. Left lower quadrant abdominal pain: 10/10, waxes and wanes, consider relation to constipation-patient tells me she has not had a bowel movement in a week 3. Multiple myeloma: Diagnosed August 2017, off chemotherapy 3 weeks due to  concern for contribution to symptoms  Plan: 1. Added MiraLAX 17 g twice a day for constipation 2. Continue Pantoprazole 40 mg twice a day as recommended above by Dr. Fuller Plan for 2 months and then 40 mg by mouth every morning 3. Continue Zofran 4 mg by mouth before meals and at bedtime for nausea 4. Please await any further recommendations from Dr. Fuller Plan  Thank you for kind consultation, we will continue to follow along   LOS: 2 days   Levin Erp  11/19/2016, 10:47 AM  Pager # [phone]      Attending physician's note   I have taken an interval history, reviewed the chart and examined the patient. I agree with the Advanced Practitioner's note, impression and recommendations. Nausea and vomiting have improved. Continue treatment for erosive esophagitis with PPI bid and Zofran. LLQ pain and constipation persist. Abd MRI showed only bilateral benign appearing renal cysts. Bowel purge and ongoing Miralax.   Lucio Edward, MD [facility] [phone] Mon-Fri 8a-5p [phone] after 5p, weekends, holidays

## 2016-11-20 ENCOUNTER — Ambulatory Visit: Payer: Medicare HMO | Admitting: Nurse Practitioner

## 2016-11-20 ENCOUNTER — Ambulatory Visit: Payer: Medicare HMO

## 2016-11-20 ENCOUNTER — Other Ambulatory Visit: Payer: Medicare HMO

## 2016-11-20 DIAGNOSIS — E43 Unspecified severe protein-calorie malnutrition: Secondary | ICD-10-CM

## 2016-11-20 DIAGNOSIS — K2210 Ulcer of esophagus without bleeding: Secondary | ICD-10-CM

## 2016-11-20 LAB — CBC
HCT: 29.2 % — ABNORMAL LOW (ref 36.0–46.0)
Hemoglobin: 9.5 g/dL — ABNORMAL LOW (ref 12.0–15.0)
MCH: 30.4 pg (ref 26.0–34.0)
MCHC: 32.5 g/dL (ref 30.0–36.0)
MCV: 93.6 fL (ref 78.0–100.0)
PLATELETS: 113 10*3/uL — AB (ref 150–400)
RBC: 3.12 MIL/uL — ABNORMAL LOW (ref 3.87–5.11)
RDW: 16 % — AB (ref 11.5–15.5)
WBC: 2.9 10*3/uL — AB (ref 4.0–10.5)

## 2016-11-20 LAB — BASIC METABOLIC PANEL
ANION GAP: 6 (ref 5–15)
BUN: <5 mg/dL — ABNORMAL LOW (ref 6–20)
CALCIUM: 7.9 mg/dL — AB (ref 8.9–10.3)
CO2: 22 mmol/L (ref 22–32)
CREATININE: 0.84 mg/dL (ref 0.44–1.00)
Chloride: 116 mmol/L — ABNORMAL HIGH (ref 101–111)
Glucose, Bld: 100 mg/dL — ABNORMAL HIGH (ref 65–99)
Potassium: 3.1 mmol/L — ABNORMAL LOW (ref 3.5–5.1)
Sodium: 144 mmol/L (ref 135–145)

## 2016-11-20 LAB — MAGNESIUM: Magnesium: 1.7 mg/dL (ref 1.7–2.4)

## 2016-11-20 MED ORDER — POTASSIUM CHLORIDE CRYS ER 20 MEQ PO TBCR
40.0000 meq | EXTENDED_RELEASE_TABLET | ORAL | Status: AC
  Administered 2016-11-20 (×2): 40 meq via ORAL
  Filled 2016-11-20 (×2): qty 2

## 2016-11-20 NOTE — Progress Notes (Addendum)
[facility] Progress Note  Chief Complaint:   Nausea, vomiting, left sided abdominal pain  Subjective: No nausea today. Had 2 BMs this am. Abdominal pain a little better.   Objective:  Vital signs in last 24 hours: Temp:  [99.3 F (37.4 C)-100 F (37.8 C)] 99.3 F (37.4 C) (02/01 0442) Pulse Rate:  [50-57] 53 (02/01 0442) Resp:  [16-17] 16 (02/01 0442) BP: (143-175)/(68-79) 143/68 (02/01 0442) SpO2:  [96 %-100 %] 100 % (02/01 0442) Last BM Date: 11/12/16 (receiving movi-prep for constipation) General:   Alert, well-developed, black female in NAD EENT:  Normal hearing, non icteric sclera, conjunctive pink.  Heart:  Regular rate, irregular rhythm, no lower extremity edema Pulm: Normal respiratory effort.. Abdomen:  Soft, nondistended, nontender.  Normal bowel sounds, no masses felt. No hepatomegaly.    Neurologic:  Alert and  oriented x4;  grossly normal neurologically. Psych:  Alert and cooperative. Normal mood and affect. Skin:   Intake/Output from previous day: 01/31 0701 - 02/01 0700 In: 200 [P.O.:200] Out: 250 [Urine:250] Intake/Output this shift: Total I/O In: 1200 [I.V.:1200] Out: -   Lab Results:  Recent Labs  11/18/16 0559 11/19/16 0406 11/20/16 0419  WBC 3.7* 3.2* 2.9*  HGB 10.9* 10.3* 9.5*  HCT 33.4* 32.6* 29.2*  PLT 131* 124* 113*   BMET  Recent Labs  11/17/16 1425 11/18/16 0559 11/20/16 0419  NA 141 143 144  K 3.9 3.6 3.1*  CL 106 114* 116*  CO2 '25 24 22  ' GLUCOSE 122* 104* 100*  BUN 11 8 <5*  CREATININE 0.89 0.78 0.84  CALCIUM 9.9 8.6* 7.9*   LFT  Recent Labs  11/17/16 1425  PROT 7.4  ALBUMIN 4.5  AST 16  ALT 12*  ALKPHOS 80  BILITOT 0.7    Ct Abdomen Pelvis W Contrast  Result Date: 11/19/2016 CLINICAL DATA:  Left lower quadrant pain.  Nausea and vomiting. EXAM: CT ABDOMEN AND PELVIS WITH CONTRAST TECHNIQUE: Multidetector CT imaging of the abdomen and pelvis was performed using the standard protocol  following bolus administration of intravenous contrast. CONTRAST:  147m ISOVUE-300 IOPAMIDOL (ISOVUE-300) INJECTION 61% COMPARISON:  Abdominal MRI 11/18/2016. CT abdomen and pelvis 10/27/2016. FINDINGS: Lower chest: Subsegmental atelectasis in the lung bases. At most trace pleural fluid bilaterally. Partially visualize coronary artery atherosclerosis. Hepatobiliary: No focal liver abnormality is seen. No gallstones, gallbladder wall thickening, or biliary dilatation. Pancreas: Unremarkable. Spleen: Unremarkable. Adrenals/Urinary Tract: Unremarkable adrenal glands. Small bilateral renal lesions more fully characterized on yesterday's MRI. No hydronephrosis. No renal calculi. Unremarkable bladder. Stomach/Bowel: Small sliding hiatal hernia. No evidence of bowel obstruction. Scattered left-sided colonic diverticula without evidence of diverticulitis. Prior appendectomy. Vascular/Lymphatic: Abdominal aortic atherosclerosis without aneurysm. Bilateral renal stents which are grossly patent. No enlarged lymph nodes in the abdomen or pelvis. Reproductive: Status post hysterectomy. No adnexal masses. Other: No intraperitoneal free fluid. No abdominal wall mass or hernia. Musculoskeletal: 1.6 cm lytic lesion in the right aspect of the T12 vertebral body, enlarged from 07/24/2015 an new from 12/31/2011. Unchanged L1 vertebral body hemangioma. Grade 1 retrolisthesis of T12 on L1 and L1 on L2. Multilevel disc degeneration, advanced at T12-L1 and L5-S1. IMPRESSION: 1. No acute abnormality identified in the abdomen or pelvis. 2. 1.6 cm T12 vertebral body lesion, enlarged from 2016. This likely reflects patient's known multiple myeloma, although a metastasis from a non-hematologic malignancy is also possible. 3. Aortic atherosclerosis. Electronically Signed   By: ALogan BoresM.D.   On: 11/19/2016 15:05    Assessment /  Plan:  1. 71 yo female admitted with nausea and vomiting and left sided abdominal pain. Undergoing tx for  multiple myeloma but no improvement in symptoms despite holding treatment. No acute processes on CTscan or MR abdomen. Inpatient EGD reveals Grade D esophagitis, moderate intrinsic stenosis at GEJ and erythematous duodenopathy. Nausea has resolved. To try solids today. Her abdominal pain is slowly improving with bowel purge. -continue BID PPI -continue Zofran ac and HS -continue bowel purge  2. Constipation, No BM in a few days so we ordered bowel purge yesterday. She is still drinking Moviprep. Had 2 BMs early this am with slight improvement in abdominal pain.   -Continue with the bowel purge. Following that she will need Miralax 1-2 times a day.  3. Pancytopenia, undergoing treatment for multiple myeloma    Principal Problem:   Intractable nausea and vomiting Active Problems:   Malnutrition of moderate degree   Multiple myeloma (HCC)   Abdominal pain   LUQ abdominal pain   Protein-calorie malnutrition, severe   LLQ pain     LOS: 3 days   Tye Savoy NP  11/20/2016, 9:42 AM  Pager number [phone]    Attending physician's note   I have taken an interval history, reviewed the chart and examined the patient. I agree with the Advanced Practitioner's note, impression and recommendations.  * Erosive esophagitis, N/V. Continue BID PPI long term, continue Zofran ac and hs for now * Constipation, pt encouraged to complete second liter of MoviPrep as instructed and then remain on Miralax daily long term.  * LLQ pain related to constipation or musculoskeletal etiologies.  No additional recommendations. GI signing off.  Outpatient GI follow up with Dr. Hilarie Fredrickson.    Lucio Edward, MD [facility] [phone] Mon-Fri 8a-5p [phone] after 5p, weekends, holidays

## 2016-11-20 NOTE — Progress Notes (Addendum)
PROGRESS NOTE    KYMONI LESPERANCE   KGY:[id number]  DOB: Nov 03, 1945  DOA: 11/17/2016 PCP: Philis Fendt, MD   Brief Narrative:  Abigail [name] is a 71 y.o. female with a history of Multiple Myeloma, hiatal hernia, gastric erosions & left-sided diverticulosis. She presents with intractable nausea, vomiting and LLQ abdominal pain (for > 1 wk).    Subjective: Pain in LLQ continues. Tolerating clear liquids. Began having BMs early this AM and has had 2.   Assessment & Plan:  Intractable nausea and vomiting - Improved  - EGD shows reflux esophagitis, benign esophageal stricture, small hiatal hernia with erythematous duodenopathy - bid PPI  - advancing diet  LLQ pain - this is the reason she presented to the hospital - she describe it as a severe and constant pain - MRA unrevealing - CT with contrast also unrevealing - GI giving her a bowel prep for constipation-  - pain a little worse today due to cramping/ BMs- k pad added   Hypokalemia - replace today, check Mg  Bilateral renal cysts MRI significant for simple cysts and two hemorrhagic vs proteinaceous cysts  Multiple myeloma with acute pancytopenia  - with lytic lesions in spine noted on CT- Zometal 11/17 and Q 3 months - oncology recommends holding Revlimid, Velcade, Decadron for now (holding for several weeks now) - follow CBC for pancytopenia  DVT prophylaxis: Lovenox Code Status: Full code Family Communication:  Disposition Plan: home when stable Consultants:   GI Procedures:   EGD Antimicrobials:  Anti-infectives    Start     Dose/Rate Route Frequency Ordered Stop   11/17/16 2200  acyclovir (ZOVIRAX) tablet 400 mg     400 mg Oral 2 times daily 11/17/16 1404         Objective: Vitals:   11/19/16 1451 11/19/16 2159 11/20/16 0306 11/20/16 0442  BP: (!) 175/79 (!) 162/73  (!) 143/68  Pulse: (!) 57 (!) 50  (!) 53  Resp: _0 Temp: 99.6 F (37.6 C) 100 F (37.8 C) 99.6 F (37.6 C) 99.3 F (37.4 C)    TempSrc: Oral Oral Oral Oral  SpO2: 100% 96%  100%  Weight:      Height:        Intake/Output Summary (Last 24 hours) at 11/20/16 1014 Last data filed at 11/20/16 0947  Gross per 24 hour  Intake             1400 ml  Output              550 ml  Net              850 ml   Filed Weights   11/17/16 1310 11/18/16 1016  Weight: 72.2 kg (159 lb 2.8 oz) 72.1 kg (159 lb)    Examination: General exam: Appears comfortable  HEENT: PERRLA, oral mucosa moist, no sclera icterus or thrush Respiratory system: Clear to auscultation. Respiratory effort normal. Cardiovascular system: S1 & S2 heard, RRR.  No murmurs  Gastrointestinal system: Abdomen soft, tender in LLQ, nondistended. Normal bowel sound. No organomegaly Central nervous system: Alert and oriented. No focal neurological deficits. Extremities: No cyanosis, clubbing or edema Skin: No rashes or ulcers Psychiatry:  Mood & affect appropriate.     Data Reviewed: I have personally reviewed following labs and imaging studies  CBC:  Recent Labs Lab 11/13/16 1224  11/16/16 1952 11/17/16 1425 11/18/16 0559 11/19/16 0406 11/20/16 0419  WBC 4.4  < > 6.6 6.7  3.7* 3.2* 2.9*  NEUTROABS 3.8  --  4.5  --   --   --   --   HGB 13.4  < > 13.3 13.5 10.9* 10.3* 9.5*  HCT 41.5  < > 40.6 42.8 33.4* 32.6* 29.2*  MCV 93.5  < > 91.9 93.7 91.8 94.8 93.6  PLT 214  < > 176 169 131* 124* 113*  < > = values in this interval not displayed. Basic Metabolic Panel:  Recent Labs Lab 11/14/16 2257 11/16/16 1952 11/17/16 1425 11/18/16 0559 11/20/16 0419  NA 141 137 141 143 144  K 3.8 4.0 3.9 3.6 3.1*  CL 110 106 106 114* 116*  CO2 _0 GLUCOSE 122* 108* 122* 104* 100*  BUN _1 <5*  CREATININE 0.78 0.89 0.89 0.78 0.84  CALCIUM 8.9 9.2 9.9 8.6* 7.9*  MG  --   --  2.1  --   --    GFR: Estimated Creatinine Clearance: 63.7 mL/min (by C-G formula based on SCr of 0.84 mg/dL). Liver Function Tests:  Recent Labs Lab  11/13/16 1224 11/14/16 2257 11/16/16 1952 11/17/16 1425  AST _2 ALT 11 11* 11* 12*  ALKPHOS 111 70 79 80  BILITOT 0.51 0.7 0.8 0.7  PROT 7.4 6.2* 6.9 7.4  ALBUMIN 4.4 4.0 4.4 4.5    Recent Labs Lab 11/14/16 2257 11/16/16 1952  LIPASE 23 23   No results for input(s): AMMONIA in the last 168 hours. Coagulation Profile: No results for input(s): INR, PROTIME in the last 168 hours. Cardiac Enzymes: No results for input(s): CKTOTAL, CKMB, CKMBINDEX, TROPONINI in the last 168 hours. BNP (last 3 results) No results for input(s): PROBNP in the last 8760 hours. HbA1C: No results for input(s): HGBA1C in the last 72 hours. CBG: No results for input(s): GLUCAP in the last 168 hours. Lipid Profile: No results for input(s): CHOL, HDL, LDLCALC, TRIG, CHOLHDL, LDLDIRECT in the last 72 hours. Thyroid Function Tests: No results for input(s): TSH, T4TOTAL, FREET4, T3FREE, THYROIDAB in the last 72 hours. Anemia Panel: No results for input(s): VITAMINB12, FOLATE, FERRITIN, TIBC, IRON, RETICCTPCT in the last 72 hours. Urine analysis:    Component Value Date/Time   COLORURINE STRAW (A) 11/17/2016 1828   APPEARANCEUR CLEAR 11/17/2016 1828   LABSPEC 1.010 11/17/2016 1828   LABSPEC 1.010 10/30/2016 1522   PHURINE 7.0 11/17/2016 1828   GLUCOSEU NEGATIVE 11/17/2016 1828   GLUCOSEU Negative 10/30/2016 1522   HGBUR NEGATIVE 11/17/2016 1828   BILIRUBINUR NEGATIVE 11/17/2016 1828   BILIRUBINUR Negative 10/30/2016 1522   KETONESUR 5 (A) 11/17/2016 1828   PROTEINUR 100 (A) 11/17/2016 1828   UROBILINOGEN 0.2 10/30/2016 1522   NITRITE NEGATIVE 11/17/2016 1828   LEUKOCYTESUR NEGATIVE 11/17/2016 1828   LEUKOCYTESUR Trace 10/30/2016 1522   Sepsis Labs: _3 (procalcitonin:4,lacticidven:4) )No results found for this or any previous visit (from the past 240 hour(s)).       Radiology Studies: Ct Abdomen Pelvis W Contrast  Result Date: 11/19/2016 CLINICAL DATA:  Left lower  quadrant pain.  Nausea and vomiting. EXAM: CT ABDOMEN AND PELVIS WITH CONTRAST TECHNIQUE: Multidetector CT imaging of the abdomen and pelvis was performed using the standard protocol following bolus administration of intravenous contrast. CONTRAST:  111m ISOVUE-300 IOPAMIDOL (ISOVUE-300) INJECTION 61% COMPARISON:  Abdominal MRI 11/18/2016. CT abdomen and pelvis 10/27/2016. FINDINGS: Lower chest: Subsegmental atelectasis in the lung bases. At most trace pleural fluid bilaterally. Partially visualize coronary artery atherosclerosis. Hepatobiliary: No focal liver abnormality  is seen. No gallstones, gallbladder wall thickening, or biliary dilatation. Pancreas: Unremarkable. Spleen: Unremarkable. Adrenals/Urinary Tract: Unremarkable adrenal glands. Small bilateral renal lesions more fully characterized on yesterday's MRI. No hydronephrosis. No renal calculi. Unremarkable bladder. Stomach/Bowel: Small sliding hiatal hernia. No evidence of bowel obstruction. Scattered left-sided colonic diverticula without evidence of diverticulitis. Prior appendectomy. Vascular/Lymphatic: Abdominal aortic atherosclerosis without aneurysm. Bilateral renal stents which are grossly patent. No enlarged lymph nodes in the abdomen or pelvis. Reproductive: Status post hysterectomy. No adnexal masses. Other: No intraperitoneal free fluid. No abdominal wall mass or hernia. Musculoskeletal: 1.6 cm lytic lesion in the right aspect of the T12 vertebral body, enlarged from 07/24/2015 an new from 12/31/2011. Unchanged L1 vertebral body hemangioma. Grade 1 retrolisthesis of T12 on L1 and L1 on L2. Multilevel disc degeneration, advanced at T12-L1 and L5-S1. IMPRESSION: 1. No acute abnormality identified in the abdomen or pelvis. 2. 1.6 cm T12 vertebral body lesion, enlarged from 2016. This likely reflects patient's known multiple myeloma, although a metastasis from a non-hematologic malignancy is also possible. 3. Aortic atherosclerosis. Electronically  Signed   By: Logan Bores M.D.   On: 11/19/2016 15:05      Scheduled Meds: . acyclovir  400 mg Oral BID  . amLODipine  10 mg Oral QHS  . aspirin EC  81 mg Oral Daily  . docusate sodium  100 mg Oral Q12H  . enoxaparin (LOVENOX) injection  40 mg Subcutaneous Q24H  . FLUoxetine  10 mg Oral QHS  . isosorbide mononitrate  60 mg Oral Daily  . ondansetron  4 mg Oral TID AC & HS  . pantoprazole  40 mg Oral BID AC  . polyethylene glycol  17 g Oral BID  . potassium chloride  40 mEq Oral Q4H  . pravastatin  40 mg Oral QPM   Continuous Infusions:    LOS: 3 days    Time spent in minutes: 44    Elsa, MD [facility] Pager: [email or web address] Password [email or web address] 11/20/2016, 10:14 AM

## 2016-11-21 MED ORDER — POLYETHYLENE GLYCOL 3350 17 G PO PACK: 17.0000 g | PACK | Freq: Two times a day (BID) | ORAL | 0 refills | Status: DC

## 2016-11-21 MED ORDER — SODIUM CHLORIDE 0.9 % IV SOLN
30.0000 meq | INTRAVENOUS | Status: DC
  Filled 2016-11-21 (×2): qty 15

## 2016-11-21 MED ORDER — PANTOPRAZOLE SODIUM 40 MG PO TBEC: 40.0000 mg | DELAYED_RELEASE_TABLET | Freq: Two times a day (BID) | ORAL | 0 refills | Status: DC

## 2016-11-21 MED ORDER — DOCUSATE SODIUM 100 MG PO CAPS: 100.0000 mg | ORAL_CAPSULE | Freq: Two times a day (BID) | ORAL | 0 refills | Status: DC

## 2016-11-21 MED ORDER — POTASSIUM CHLORIDE CRYS ER 20 MEQ PO TBCR
40.0000 meq | EXTENDED_RELEASE_TABLET | ORAL | Status: AC
  Administered 2016-11-21 (×2): 40 meq via ORAL
  Filled 2016-11-21 (×2): qty 2

## 2016-11-21 NOTE — Progress Notes (Signed)
Dc home with family. She verbalized understanding of scripts and MD APPTS and D/c instruction

## 2016-11-21 NOTE — Discharge Instructions (Signed)
Esophagitis Introduction Esophagitis is inflammation of the esophagus. The esophagus is the tube that carries food and liquids from your mouth to your stomach. Esophagitis can cause soreness or pain in the esophagus. This condition can make it difficult and painful to swallow. What are the causes? Most causes of esophagitis are not serious. Common causes of this condition include:  Gastroesophageal reflux disease (GERD). This is when stomach contents move back up into the esophagus (reflux).  Repeated vomiting.  An allergic-type reaction, especially caused by food allergies (eosinophilic esophagitis).  Injury to the esophagus by swallowing large pills with or without water, or swallowing certain types of medicines.  Swallowing (ingesting) harmful chemicals, such as household cleaning products.  Heavy alcohol use.  An infection of the esophagus.This most often occurs in people who have a weakened immune system.  Radiation or chemotherapy treatment for cancer.  Certain diseases such as sarcoidosis, Crohn disease, and scleroderma. What are the signs or symptoms? Symptoms of this condition include:  Difficult or painful swallowing.  Pain with swallowing acidic liquids, such as citrus juices.  Pain with burping.  Chest pain.  Difficulty breathing.  Nausea.  Vomiting.  Pain in the abdomen.  Weight loss.  Ulcers in the mouth.  Patches of white material in the mouth (candidiasis).  Fever.  Coughing up blood or vomiting blood.  Stool that is black, tarry, or bright red. How is this diagnosed? Your health care provider will take a medical history and perform a physical exam. You may also have other tests, including:  An endoscopy to examine your stomach and esophagus with a small camera.  A test that measures the acidity level in your esophagus.  A test that measures how much pressure is on your esophagus.  A barium swallow or modified barium swallow to show the  shape, size, and functioning of your esophagus.  Allergy tests. How is this treated? Treatment for this condition depends on the cause of your esophagitis. In some cases, steroids or other medicines may be given to help relieve your symptoms or to treat the underlying cause of your condition. You may have to make some lifestyle changes, such as:  Avoiding alcohol.  Quitting smoking.  Changing your diet.  Exercising.  Changing your sleep habits and your sleep environment. Follow these instructions at home: Take these actions to decrease your discomfort and to help avoid complications. Diet  Follow a diet as recommended by your health care provider. This may involve avoiding foods and drinks such as:  Coffee and tea (with or without caffeine).  Drinks that contain alcohol.  Energy drinks and sports drinks.  Carbonated drinks or sodas.  Chocolate and cocoa.  Peppermint and mint flavorings.  Garlic and onions.  Horseradish.  Spicy and acidic foods, including peppers, chili powder, curry powder, vinegar, hot sauces, and barbecue sauce.  Citrus fruit juices and citrus fruits, such as oranges, lemons, and limes.  Tomato-based foods, such as red sauce, chili, salsa, and pizza with red sauce.  Fried and fatty foods, such as donuts, french fries, potato chips, and high-fat dressings.  High-fat meats, such as hot dogs and fatty cuts of red and white meats, such as rib eye steak, sausage, ham, and bacon.  High-fat dairy items, such as whole milk, butter, and cream cheese.  Eat small, frequent meals instead of large meals.  Avoid drinking large amounts of liquid with your meals.  Avoid eating meals during the 2-3 hours before bedtime.  Avoid lying down right after you eat.  Do not exercise right after you eat.  Avoid foods and drinks that seem to make your symptoms worse. General instructions  Pay attention to any changes in your symptoms.  Take over-the-counter and  prescription medicines only as told by your health care provider. Do not take aspirin, ibuprofen, or other NSAIDs unless your health care provider told you to do so.  If you have trouble taking pills, use a pill splitter to decrease the size of the pill. This will decrease the chance of the pill getting stuck or injuring your esophagus on the way down. Also, drink water after you take a pill.  Do not use any tobacco products, including cigarettes, chewing tobacco, and e-cigarettes. If you need help quitting, ask your health care provider.  Wear loose-fitting clothing. Do not wear anything tight around your waist that causes pressure on your abdomen.  Raise (elevate) the head of your bed about 6 inches (15 cm).  Try to reduce your stress, such as with yoga or meditation. If you need help reducing stress, ask your health care provider.  If you are overweight, reduce your weight to an amount that is healthy for you. Ask your health care provider for guidance about a safe weight loss goal.  Keep all follow-up visits as told by your health care provider. This is important. Contact a health care provider if:  You have new symptoms.  You have unexplained weight loss.  You have difficulty swallowing, or it hurts to swallow.  You have wheezing or a persistent cough.  Your symptoms do not improve with treatment.  You have frequent heartburn for more than two weeks. Get help right away if:  You have severe pain in your arms, neck, jaw, teeth, or back.  You feel sweaty, dizzy, or light-headed.  You have chest pain or shortness of breath.  You vomit and your vomit looks like blood or coffee grounds.  Your stool is bloody or black.  You have a fever.  You cannot swallow, drink, or eat. This information is not intended to replace advice given to you by your health care provider. Make sure you discuss any questions you have with your health care provider. Document Released: 11/13/2004  Document Revised: 03/13/2016 Document Reviewed: 01/31/2015  2017 [employer]   Additional discharge instructions  Please get your medications reviewed and adjusted by your Primary MD.  Please request your Primary MD to go over all Hospital Tests and Procedure/Radiological results at the follow up, please get all Hospital records sent to your Prim MD by signing hospital release before you go home.  If you had Pneumonia of Lung problems at the Hospital: Please get a 2 view Chest X ray done in 6-8 weeks after hospital discharge or sooner if instructed by your Primary MD.  If you have Congestive Heart Failure: Please call your Cardiologist or Primary MD anytime you have any of the following symptoms:  1) 3 pound weight gain in 24 hours or 5 pounds in 1 week  2) shortness of breath, with or without a dry hacking cough  3) swelling in the hands, feet or stomach  4) if you have to sleep on extra pillows at night in order to breathe  Follow cardiac low salt diet and 1.5 lit/day fluid restriction.  If you have diabetes Accuchecks 4 times/day, Once in AM empty stomach and then before each meal. Log in all results and show them to your primary doctor at your next visit. If any glucose reading is under  80 or above 300 call your primary MD immediately.  If you have Seizure/Convulsions/Epilepsy: Please do not drive, operate heavy machinery, participate in activities at heights or participate in high speed sports until you have seen by Primary MD or a Neurologist and advised to do so again.  If you had Gastrointestinal Bleeding: Please ask your Primary MD to check a complete blood count within one week of discharge or at your next visit. Your endoscopic/colonoscopic biopsies that are pending at the time of discharge, will also need to followed by your Primary MD.  Get Medicines reviewed and adjusted. Please take all your medications with you for your next visit with your Primary MD  Please request  your Primary MD to go over all hospital tests and procedure/radiological results at the follow up, please ask your Primary MD to get all Hospital records sent to his/her office.  If you experience worsening of your admission symptoms, develop shortness of breath, life threatening emergency, suicidal or homicidal thoughts you must seek medical attention immediately by calling 911 or calling your MD immediately  if symptoms less severe.  You must read complete instructions/literature along with all the possible adverse reactions/side effects for all the Medicines you take and that have been prescribed to you. Take any new Medicines after you have completely understood and accpet all the possible adverse reactions/side effects.   Do not drive or operate heavy machinery when taking Pain medications.   Do not take more than prescribed Pain, Sleep and Anxiety Medications  Special Instructions: If you have smoked or chewed Tobacco  in the last 2 yrs please stop smoking, stop any regular Alcohol  and or any Recreational drug use.  Wear Seat belts while driving.  Please note You were cared for by a hospitalist during your hospital stay. If you have any questions about your discharge medications or the care you received while you were in the hospital after you are discharged, you can call the unit and asked to speak with the hospitalist on call if the hospitalist that took care of you is not available. Once you are discharged, your primary care physician will handle any further medical issues. Please note that NO REFILLS for any discharge medications will be authorized once you are discharged, as it is imperative that you return to your primary care physician (or establish a relationship with a primary care physician if you do not have one) for your aftercare needs so that they can reassess your need for medications and monitor your lab values.  You can reach the hospitalist office at phone [phone] or  fax [phone]   If you do not have a primary care physician, you can call [phone] for a physician referral.

## 2016-11-21 NOTE — Discharge Summary (Addendum)
Physician Discharge Summary  Abigail [name]:[id number] DOB: 08-Sep-1946  PCP: Philis Fendt, MD  Admit date: 11/17/2016 Discharge date: 11/21/2016  Recommendations for Outpatient Follow-up:  1. Dr. Nolene Ebbs, PCP in 1 week. 2. Dr. Ladell Pier, Medical Oncology: Patient has appointment on 11/27/2016 for labs and follow up. 3. Dr. Zenovia Jarred, [place]: None Equipment/Devices: None    Discharge Condition: Improved and stable.  CODE STATUS: Full  Diet recommendation: Heart Healthy.  Discharge Diagnoses:  Principal Problem:   Intractable nausea and vomiting Active Problems:   Malnutrition of moderate degree   Multiple myeloma (HCC)   Abdominal pain   LUQ abdominal pain   Protein-calorie malnutrition, severe   LLQ pain   Erosive esophagitis   Brief/Interim Summary: 71 year old female with PMH of duodenitis/gastric erosions/gastric ulcer/GERD, anemia requiring frequent transfusions in the past but none recently, HLD, HTN, CAD, multiple myeloma, depression was sent to the hospital as a direct admission from [place] office due to intractable nausea, vomiting and abdominal pain. She was being seen at the [facility] for left mid and lower abdominal pain that was sharp, constant with abrupt flares and severe, worsening over the past 3 weeks. She had sought care in the area multiple times for this, but had been discharged home of her labs were unrevealing. Her pain was not relieved by oral hydrocodone at home and the associated severe constant nausea was not relieved with Compazine. Appetite had decreased and she reported weight loss. She was diagnosed with multiple myeloma in August 2017 and had started therapy with Revlimid and Velcade around that time. Due to the concern that these were causing/contributing to her symptoms, these were held for the past 3 weeks without improvement. [facility] was consulted.  Assessment and plan:  1. Intractable nausea and  vomiting: She was undergoing treatment for multiple myeloma but no improvement in symptoms despite holding the medications that were felt to be the cause. No acute processes noted on CT scan or MRI of the abdomen. [facility] was consulted. She underwent EGD (detailed results as below) which essentially showed grade D esophagitis, moderate intrinsic stenosis at GEJ and erythematous due to neuropathy. She was treated supportively and her diet was gradually advanced. She has tolerated soft diet today without any further nausea or vomiting for the last couple of days. GI recommends twice a day PPI and outpatient follow-up with them. 2. Left lower quadrant abdominal pain/constipation: Etiology of this is not fully clear. She had been having ongoing pain for >3 weeks. CT abdomen and MR unrevealing. Since she had not had a BM for a few days, [facility] ordered bowel purge with MoviPrep and patient has been having BMs since. Abdominal pain has improved. She has the chronic mild 3-10 left lower quadrant abdominal pain but able to tolerate diet and ambulate without significant discomfort. 3. Pancytopenia: As per discussion with her oncologist today, in the past she was transfusion dependent for her anemia but has not had blood transfusions in some time. Her myeloma has done quite well with treatment but she has been off of medications for a couple of weeks due to abdominal pain, nausea and vomiting. Follow CBCs as outpatient next week. 4. Hypokalemia: Potassium replaced prior to discharge. Magnesium 1.7. 5. Bilateral renal cysts: Outpatient follow-up. MR abdomen done to evaluate right renal lesion seen on recent CT and ultrasound examinations. As per report, the 2 lesions in question on the CT scan are both hemorrhagic  or proteinaceous cysts. No worrisome contrast enhancement. 6. Multiple myeloma: Discussion as above. Discussed with her oncologist who will see her in the office next week. Lytic lesions in the spine noted  on CT. Patient apparently is on Zometa every 3 months. Oncology recommends holding Revlimid, Velcade, Decadron for now (holding for several weeks now). 7. Essential hypertension: Reasonably controlled on amlodipine, Imdur. 8. Hyperlipidemia: Continue statins. 9. CAD: Asymptomatic of chest pain. Continue aspirin, statins and beta blockers. 10. Depression: No suicidal or homicidal ideations. Continue Prozac.    Consultations:  [facility]  Procedures:  EGD 11/18/16: Impression:               - LA Grade D reflux esophagitis.                           - Benign-appearing esophageal stenosis.                           - Small hiatal hernia.                           - Erythematous duodenopathy.                           - No specimens collected. Moderate Sedation:      N/A- Per Anesthesia Care Recommendation:           - Return patient to hospital ward for ongoing care.                           - Full liquid diet today.                           - Protonix (pantoprazole) 40 mg PO BID for 2 months                            then 40 mg po qam.                           - Zofran (ondansetron) 4 mg PO ac and hs.                           - GI follow with Dr. Hilarie Fredrickson                           - Esophagitis could be primary or secondary to N/V.                            No cause for abdominal pain noted.   Discharge Instructions  Discharge Instructions    Call MD for:  difficulty breathing, headache or visual disturbances    Complete by:  As directed    Call MD for:  extreme fatigue    Complete by:  As directed    Call MD for:  persistant dizziness or light-headedness    Complete by:  As directed    Call MD for:  persistant nausea and vomiting    Complete by:  As directed    Call MD for:  severe uncontrolled pain  Complete by:  As directed    Diet - low sodium heart healthy    Complete by:  As directed    Increase activity slowly    Complete by:  As directed        Medication  List    STOP taking these medications   dexamethasone 4 MG tablet Commonly known as:  DECADRON   lenalidomide 15 MG capsule Commonly known as:  REVLIMID     TAKE these medications   acyclovir 400 MG tablet Commonly known as:  ZOVIRAX TAKE 1 TABLET (400 MG TOTAL) BY MOUTH 2 (TWO) TIMES DAILY.   amLODipine 10 MG tablet Commonly known as:  NORVASC Take 10 mg by mouth at bedtime.   aspirin EC 81 MG tablet Take 81 mg by mouth daily.   calcium carbonate 1250 (500 Ca) MG tablet Commonly known as:  OS-CAL - dosed in mg of elemental calcium Take 1 tablet (500 mg of elemental calcium total) by mouth daily with breakfast.   docusate sodium 100 MG capsule Commonly known as:  COLACE Take 1 capsule (100 mg total) by mouth 2 (two) times daily. What changed:  when to take this   FLUoxetine 10 MG capsule Commonly known as:  PROZAC Take 10 mg by mouth at bedtime.   HYDROcodone-acetaminophen 5-325 MG tablet Commonly known as:  NORCO/VICODIN Take 1-2 tablets by mouth every 4 (four) hours as needed for moderate pain or severe pain.   isosorbide mononitrate 60 MG 24 hr tablet Commonly known as:  IMDUR Take 1 tablet (60 mg total) by mouth daily.   KLOR-CON M10 10 MEQ tablet Generic drug:  potassium chloride Take 10 mEq by mouth daily.   ondansetron 4 MG tablet Commonly known as:  ZOFRAN Take 1 tablet (4 mg total) by mouth every 8 (eight) hours as needed for nausea or vomiting.   pantoprazole 40 MG tablet Commonly known as:  PROTONIX Take 1 tablet (40 mg total) by mouth 2 (two) times daily before a meal. What changed:  when to take this  additional instructions   polyethylene glycol packet Commonly known as:  MIRALAX / GLYCOLAX Take 17 g by mouth 2 (two) times daily.   pravastatin 40 MG tablet Commonly known as:  PRAVACHOL Take 40 mg by mouth every evening.   prochlorperazine 5 MG tablet Commonly known as:  COMPAZINE Take 1 tablet (5 mg total) by mouth every 6 (six)  hours as needed for nausea or vomiting.   vitamin C 1000 MG tablet Take 1,000 mg by mouth daily.   VITAMIN D PO Take 1 capsule by mouth daily.      Follow-up Information    Philis Fendt, MD. Schedule an appointment as soon as possible for a visit in 1 week(s).   Specialty:  Internal Medicine Contact information: [place] [address] [phone]        [name], MD Follow up on 11/27/2016.   Specialty:  Oncology Why:  Please keep prior appointment for labs (CBC with differential & BMP) and MD follow up. Contact information: [place] [address] [phone]        Jerene Bears, MD. Schedule an appointment as soon as possible for a visit.   Specialty:  Gastroenterology Contact information: [address] [place] [address] [phone]          Allergies  Allergen Reactions  . Sulfa Antibiotics Rash     Procedures/Studies: Dg Abdomen 1 View  Result  Date: 10/27/2016 CLINICAL DATA:  71 year old female with a history of constipation and vomiting EXAM: ABDOMEN - 1 VIEW COMPARISON:  CT 07/24/2015, plain film 06/13/2016 FINDINGS: Gas within stomach, small bowel, colon.  No abnormal distention. Calcified density in the left lateral abdomen, of uncertain location. Pelvic phleboliths. Vascular stents of renal arteries again demonstrated. Multilevel degenerative changes of the lumbar spine. Vascular calcifications of the abdominal aorta. No radiopaque foreign body. No displaced fracture IMPRESSION: Nonobstructive bowel gas pattern. Small calcification within the left mid abdomen, favored to be within the fecal stream, although could represent kidney calcification/nephrolithiasis. Aortic atherosclerosis. Vascular stents of the renal arteries. Signed, Dulcy Fanny. Earleen Newport, DO [facility] Electronically Signed   By: Corrie Mckusick D.O.   On: 10/27/2016 09:12   Mr Abdomen W  Wo Contrast  Result Date: 11/18/2016 CLINICAL DATA:  Evaluate right renal lesion seen on recent CT and ultrasound examinations. EXAM: MRI ABDOMEN WITHOUT AND WITH CONTRAST TECHNIQUE: Multiplanar multisequence MR imaging of the abdomen was performed both before and after the administration of intravenous contrast. CONTRAST:  88m MULTIHANCE GADOBENATE DIMEGLUMINE 529 MG/ML IV SOLN COMPARISON:  CT scan 10/27/2016 and ultrasound 11/10/2016 FINDINGS: Lower chest: The lung bases are grossly clear. No worrisome pulmonary lesions. No pleural or pericardial effusion. Hepatobiliary: No focal hepatic lesions or intrahepatic biliary dilatation. The gallbladder is normal. No common bile duct dilatation. The portal and hepatic veins are normal. Pancreas:  No mass, inflammation or ductal dilatation. Spleen:  Normal size.  No focal lesions. Adrenals/Urinary Tract:  The adrenal glands are unremarkable. There are multiple small bilateral renal cysts. The lower pole right renal lesion demonstrates increased T1 signal intensity and no contrast enhancement. This is consistent with a benign hemorrhagic or proteinaceous cyst. The small interpolar lesion involving the left kidney is also a small hemorrhagic or proteinaceous cyst. No worrisome renal lesions. No hydronephrosis. Stomach/Bowel: Visualized portions within the abdomen are unremarkable. Vascular/Lymphatic: Normal caliber aorta. Moderate atherosclerotic calcifications. Calcifications noted at the major branch vessel ostia. Bilateral renal artery stents are noted. No aneurysm or dissection. The major venous structures are patent. No mesenteric or retroperitoneal mass or adenopathy. Other: No ascites or abdominal wall hernia. No subcutaneous lesions. Musculoskeletal: Small scattered hemangiomas are noted. IMPRESSION: 1. Multiple bilateral renal cysts. Most of these are simple cysts. The 2 lesions in question on the CT scan are both hemorrhagic or proteinaceous cysts. No worrisome  contrast enhancement. 2. No acute abdominal findings, mass lesions or adenopathy. Electronically Signed   By: PMarijo SanesM.D.   On: 11/18/2016 10:07   Ct Abdomen Pelvis W Contrast  Result Date: 11/19/2016 CLINICAL DATA:  Left lower quadrant pain.  Nausea and vomiting. EXAM: CT ABDOMEN AND PELVIS WITH CONTRAST TECHNIQUE: Multidetector CT imaging of the abdomen and pelvis was performed using the standard protocol following bolus administration of intravenous contrast. CONTRAST:  1056mISOVUE-300 IOPAMIDOL (ISOVUE-300) INJECTION 61% COMPARISON:  Abdominal MRI 11/18/2016. CT abdomen and pelvis 10/27/2016. FINDINGS: Lower chest: Subsegmental atelectasis in the lung bases. At most trace pleural fluid bilaterally. Partially visualize coronary artery atherosclerosis. Hepatobiliary: No focal liver abnormality is seen. No gallstones, gallbladder wall thickening, or biliary dilatation. Pancreas: Unremarkable. Spleen: Unremarkable. Adrenals/Urinary Tract: Unremarkable adrenal glands. Small bilateral renal lesions more fully characterized on yesterday's MRI. No hydronephrosis. No renal calculi. Unremarkable bladder. Stomach/Bowel: Small sliding hiatal hernia. No evidence of bowel obstruction. Scattered left-sided colonic diverticula without evidence of diverticulitis. Prior appendectomy. Vascular/Lymphatic: Abdominal aortic atherosclerosis without aneurysm. Bilateral renal stents  which are grossly patent. No enlarged lymph nodes in the abdomen or pelvis. Reproductive: Status post hysterectomy. No adnexal masses. Other: No intraperitoneal free fluid. No abdominal wall mass or hernia. Musculoskeletal: 1.6 cm lytic lesion in the right aspect of the T12 vertebral body, enlarged from 07/24/2015 an new from 12/31/2011. Unchanged L1 vertebral body hemangioma. Grade 1 retrolisthesis of T12 on L1 and L1 on L2. Multilevel disc degeneration, advanced at T12-L1 and L5-S1. IMPRESSION: 1. No acute abnormality identified in the abdomen  or pelvis. 2. 1.6 cm T12 vertebral body lesion, enlarged from 2016. This likely reflects patient's known multiple myeloma, although a metastasis from a non-hematologic malignancy is also possible. 3. Aortic atherosclerosis. Electronically Signed   By: Logan Bores M.D.   On: 11/19/2016 15:05   Ct Abdomen Pelvis W Contrast  Result Date: 10/27/2016 CLINICAL DATA:  Vomiting since yesterday.  No pain. EXAM: CT ABDOMEN AND PELVIS WITH CONTRAST TECHNIQUE: Multidetector CT imaging of the abdomen and pelvis was performed using the standard protocol following bolus administration of intravenous contrast. CONTRAST:  165m ISOVUE-300 IOPAMIDOL (ISOVUE-300) INJECTION 61% COMPARISON:  07/24/2015 FINDINGS: Lower chest: No acute abnormality. Hepatobiliary: No focal liver abnormality is seen. No gallstones, gallbladder wall thickening, or biliary dilatation. Pancreas: Unremarkable. No pancreatic ductal dilatation or surrounding inflammatory changes. Spleen: Normal in size without focal abnormality. Adrenals/Urinary Tract: Adrenal glands are unremarkable. No urolithiasis or obstructive uropathy. 16.8 cm right inferior pole renal mass measuring 52 Hounsfield units which is new compared with 07/24/2015. Multiple hypodense, fluid attenuating left renal mass is most consistent with cysts with the largest measuring 2.3 cm in the mid pole. 8.3 mm indeterminate hypodense left anterior interpolar mass (image 36/series 2). Bladder is unremarkable. Stomach/Bowel: Stomach is within normal limits. No evidence of bowel wall thickening, distention, or inflammatory changes. Vascular/Lymphatic: Abdominal aortic atherosclerosis. A normal caliber abdominal aorta. No lymphadenopathy. Reproductive: Status post hysterectomy. No adnexal masses. Other: No abdominal wall hernia or abnormality. No abdominopelvic ascites. Musculoskeletal: No lytic or sclerotic osseous lesion. No acute osseous abnormality. Mild osteoarthritis bilateral hips. Severe  degenerative disc disease with disc height loss at L5-S1. Degenerative disc disease with severe disc height loss at T12-L1 and L1-2. Minimal retrolisthesis of L1 on L2 and T12 on L1. Mild osteoarthritis of bilateral sacroiliac joints. IMPRESSION: 1. No acute abdominal or pelvic pathology. 2. 16.8 cm indeterminate right inferior pole renal mass new compared with 07/24/2015. Similar smaller 8.3 mm indeterminate left anterior interpolar renal mass. Recommend further evaluation with MRI or CT of the abdomen without and with intravenous contrast. Electronically Signed   By: HKathreen Devoid  On: 10/27/2016 14:21   Dg Abdomen Acute W/chest  Result Date: 11/15/2016 CLINICAL DATA:  Initial evaluation for acute left lower quadrant pain for 3 weeks. History of multiple myeloma. EXAM: DG ABDOMEN ACUTE W/ 1V CHEST COMPARISON:  Prior CT from 10/27/2016. FINDINGS: Cardiac and mediastinal silhouettes are within normal limits. Scattered atheromatous plaque noted within the aortic arch. Lungs normally inflated. Linear opacities of the left lung base most compatible with atelectasis. No other focal airspace disease. No pulmonary edema or pleural effusion. No pneumothorax. Bowel gas pattern within normal limits without evidence for obstruction or ileus. No abnormal bowel wall thickening. Moderate to large amount of retained stool within the right colon. No soft tissue mass or abnormal calcification. Vascular stent overlies the left upper abdomen. No acute osseous abnormality. IMPRESSION: 1. Nonobstructive bowel gas pattern with no radiographic evidence for acute intra-abdominal process. 2. Moderate to large amount  of retained stool within the right colon. 3. Mild left basilar atelectasis. No other active cardiopulmonary disease. 4. Aortic atherosclerosis. Electronically Signed   By: Jeannine Boga M.D.   On: 11/15/2016 00:07      Subjective: Seen this morning. Tolerating soft diet without nausea or vomiting. Mild chronic  left lower quadrant abdominal pain, intermittent. Had multiple soft BMs after bowel prep. As per RN, ambulated in the hall without discomfort   Discharge Exam:  Vitals:   11/20/16 0442 11/20/16 1432 11/20/16 2030 11/21/16 0525  BP: (!) 143/68 (!) 147/67 (!) 142/64 (!) 151/70  Pulse: (!) 53 (!) 48 63 (!) 46  Resp: '16 16 16 18  ' Temp: 99.3 F (37.4 C) 99.3 F (37.4 C) 99 F (37.2 C) 98.1 F (36.7 C)  TempSrc: Oral Oral Oral Oral  SpO2: 100% 100% 95% 96%  Weight:      Height:        General: Pleasant middle-aged female sitting up comfortably in bed seen eating breakfast this morning. Cardiovascular: S1 & S2 heard, RRR, S1/S2 +. No murmurs, rubs, gallops or clicks. No JVD or pedal edema. Respiratory: Clear to auscultation without wheezing, rhonchi or crackles. No increased work of breathing. Abdominal:  Non distended, non tender & soft. No organomegaly or masses appreciated. Normal bowel sounds heard. CNS: Alert and oriented. No focal deficits. Extremities: no edema, no cyanosis    The results of significant diagnostics from this hospitalization (including imaging, microbiology, ancillary and laboratory) are listed below for reference.     Microbiology: No results found for this or any previous visit (from the past 240 hour(s)).   Labs: BNP (last 3 results)  Recent Labs  06/12/16 2204  BNP 773.7*   Basic Metabolic Panel:  Recent Labs Lab 11/14/16 2257 11/16/16 1952 11/17/16 1425 11/18/16 0559 11/20/16 0419  NA 141 137 141 143 144  K 3.8 4.0 3.9 3.6 3.1*  CL 110 106 106 114* 116*  CO2 '22 22 25 24 22  ' GLUCOSE 122* 108* 122* 104* 100*  BUN '11 11 11 8 ' <5*  CREATININE 0.78 0.89 0.89 0.78 0.84  CALCIUM 8.9 9.2 9.9 8.6* 7.9*  MG  --   --  2.1  --  1.7   Liver Function Tests:  Recent Labs Lab 11/14/16 2257 11/16/16 1952 11/17/16 1425  AST '16 15 16  ' ALT 11* 11* 12*  ALKPHOS 70 79 80  BILITOT 0.7 0.8 0.7  PROT 6.2* 6.9 7.4  ALBUMIN 4.0 4.4 4.5    Recent  Labs Lab 11/14/16 2257 11/16/16 1952  LIPASE 23 23   CBC:  Recent Labs Lab 11/16/16 1952 11/17/16 1425 11/18/16 0559 11/19/16 0406 11/20/16 0419  WBC 6.6 6.7 3.7* 3.2* 2.9*  NEUTROABS 4.5  --   --   --   --   HGB 13.3 13.5 10.9* 10.3* 9.5*  HCT 40.6 42.8 33.4* 32.6* 29.2*  MCV 91.9 93.7 91.8 94.8 93.6  PLT 176 169 131* 124* 113*   Urinalysis    Component Value Date/Time   COLORURINE STRAW (A) 11/17/2016 1828   APPEARANCEUR CLEAR 11/17/2016 1828   LABSPEC 1.010 11/17/2016 1828   LABSPEC 1.010 10/30/2016 1522   PHURINE 7.0 11/17/2016 1828   GLUCOSEU NEGATIVE 11/17/2016 1828   GLUCOSEU Negative 10/30/2016 1522   HGBUR NEGATIVE 11/17/2016 1828   BILIRUBINUR NEGATIVE 11/17/2016 1828   BILIRUBINUR Negative 10/30/2016 1522   KETONESUR 5 (A) 11/17/2016 1828   PROTEINUR 100 (A) 11/17/2016 1828   UROBILINOGEN 0.2 10/30/2016 1522  NITRITE NEGATIVE 11/17/2016 1828   LEUKOCYTESUR NEGATIVE 11/17/2016 1828   LEUKOCYTESUR Trace 10/30/2016 1522      Time coordinating discharge: Over 30 minutes  SIGNED:  Vernell Leep, MD, FACP, FHM. [facility] Pager [phone]  If 7PM-7AM, please contact night-coverage [email or web address] Password [facility] 11/21/2016, 3:25 PM   Addendum Responding to a CDI Query  Severe malnutrition in context of chronic illness : Management per dietitian consultation.  Vernell Leep, MD, FACP, FHM. [facility] Pager [phone]  If 7PM-7AM, please contact night-coverage [email or web address] Password [email or web address] 11/26/2016, 5:30 PM

## 2016-11-23 ENCOUNTER — Observation Stay (HOSPITAL_COMMUNITY): Payer: Medicare HMO

## 2016-11-23 ENCOUNTER — Encounter (HOSPITAL_COMMUNITY): Payer: Self-pay | Admitting: Emergency Medicine

## 2016-11-23 ENCOUNTER — Inpatient Hospital Stay (HOSPITAL_COMMUNITY)
Admission: EM | Admit: 2016-11-23 | Discharge: 2016-11-27 | DRG: 392 | Disposition: A | Discharge status: Home / Self Care | Payer: Medicare HMO | Attending: Family Medicine | Admitting: Family Medicine

## 2016-11-23 ENCOUNTER — Other Ambulatory Visit: Payer: Self-pay | Admitting: Oncology

## 2016-11-23 DIAGNOSIS — R1032 Left lower quadrant pain: Secondary | ICD-10-CM | POA: Diagnosis not present

## 2016-11-23 DIAGNOSIS — E785 Hyperlipidemia, unspecified: Secondary | ICD-10-CM | POA: Diagnosis present

## 2016-11-23 DIAGNOSIS — K5900 Constipation, unspecified: Secondary | ICD-10-CM | POA: Diagnosis present

## 2016-11-23 DIAGNOSIS — K210 Gastro-esophageal reflux disease with esophagitis: Secondary | ICD-10-CM | POA: Diagnosis present

## 2016-11-23 DIAGNOSIS — R112 Nausea with vomiting, unspecified: Secondary | ICD-10-CM | POA: Diagnosis present

## 2016-11-23 DIAGNOSIS — I252 Old myocardial infarction: Secondary | ICD-10-CM

## 2016-11-23 DIAGNOSIS — D61818 Other pancytopenia: Secondary | ICD-10-CM | POA: Diagnosis present

## 2016-11-23 DIAGNOSIS — R109 Unspecified abdominal pain: Secondary | ICD-10-CM | POA: Diagnosis present

## 2016-11-23 DIAGNOSIS — F17210 Nicotine dependence, cigarettes, uncomplicated: Secondary | ICD-10-CM | POA: Diagnosis present

## 2016-11-23 DIAGNOSIS — Z7982 Long term (current) use of aspirin: Secondary | ICD-10-CM

## 2016-11-23 DIAGNOSIS — Z79891 Long term (current) use of opiate analgesic: Secondary | ICD-10-CM

## 2016-11-23 DIAGNOSIS — Z79899 Other long term (current) drug therapy: Secondary | ICD-10-CM

## 2016-11-23 DIAGNOSIS — I2510 Atherosclerotic heart disease of native coronary artery without angina pectoris: Secondary | ICD-10-CM | POA: Diagnosis present

## 2016-11-23 DIAGNOSIS — I700 Atherosclerosis of aorta: Secondary | ICD-10-CM | POA: Diagnosis present

## 2016-11-23 DIAGNOSIS — K449 Diaphragmatic hernia without obstruction or gangrene: Secondary | ICD-10-CM | POA: Diagnosis present

## 2016-11-23 DIAGNOSIS — Z882 Allergy status to sulfonamides status: Secondary | ICD-10-CM

## 2016-11-23 DIAGNOSIS — N281 Cyst of kidney, acquired: Secondary | ICD-10-CM | POA: Diagnosis present

## 2016-11-23 DIAGNOSIS — F329 Major depressive disorder, single episode, unspecified: Secondary | ICD-10-CM | POA: Diagnosis present

## 2016-11-23 DIAGNOSIS — Z955 Presence of coronary angioplasty implant and graft: Secondary | ICD-10-CM

## 2016-11-23 DIAGNOSIS — K2980 Duodenitis without bleeding: Secondary | ICD-10-CM | POA: Diagnosis present

## 2016-11-23 DIAGNOSIS — I10 Essential (primary) hypertension: Secondary | ICD-10-CM | POA: Diagnosis present

## 2016-11-23 DIAGNOSIS — C9000 Multiple myeloma not having achieved remission: Secondary | ICD-10-CM | POA: Diagnosis present

## 2016-11-23 DIAGNOSIS — D649 Anemia, unspecified: Secondary | ICD-10-CM | POA: Diagnosis present

## 2016-11-23 DIAGNOSIS — Z8711 Personal history of peptic ulcer disease: Secondary | ICD-10-CM

## 2016-11-23 LAB — COMPREHENSIVE METABOLIC PANEL
ALK PHOS: 84 U/L (ref 38–126)
ALT: 12 U/L — AB (ref 14–54)
AST: 28 U/L (ref 15–41)
Albumin: 4.7 g/dL (ref 3.5–5.0)
Anion gap: 10 (ref 5–15)
BUN: 7 mg/dL (ref 6–20)
CALCIUM: 9.8 mg/dL (ref 8.9–10.3)
CHLORIDE: 101 mmol/L (ref 101–111)
CO2: 26 mmol/L (ref 22–32)
CREATININE: 0.82 mg/dL (ref 0.44–1.00)
GFR calc Af Amer: >60 mL/min (ref >60)
Glucose, Bld: 139 mg/dL — ABNORMAL HIGH (ref 65–99)
Potassium: 4.3 mmol/L (ref 3.5–5.1)
Sodium: 137 mmol/L (ref 135–145)
Total Bilirubin: 1.1 mg/dL (ref 0.3–1.2)
Total Protein: 7.5 g/dL (ref 6.5–8.1)

## 2016-11-23 LAB — URINALYSIS, ROUTINE W REFLEX MICROSCOPIC
Bacteria, UA: NONE SEEN
Bilirubin Urine: NEGATIVE
Glucose, UA: NEGATIVE mg/dL
HGB URINE DIPSTICK: NEGATIVE
Ketones, ur: 5 mg/dL — AB
Leukocytes, UA: NEGATIVE
Nitrite: NEGATIVE
PH: 8 (ref 5.0–8.0)
Protein, ur: 100 mg/dL — AB
Specific Gravity, Urine: 1.009 (ref 1.005–1.030)

## 2016-11-23 LAB — CBC
HCT: 40 % (ref 36.0–46.0)
Hemoglobin: 13.2 g/dL (ref 12.0–15.0)
MCH: 29.8 pg (ref 26.0–34.0)
MCHC: 33 g/dL (ref 30.0–36.0)
MCV: 90.3 fL (ref 78.0–100.0)
Platelets: 212 10*3/uL (ref 150–400)
RBC: 4.43 MIL/uL (ref 3.87–5.11)
RDW: 15.9 % — AB (ref 11.5–15.5)
WBC: 4.8 10*3/uL (ref 4.0–10.5)

## 2016-11-23 LAB — LIPASE, BLOOD: Lipase: 16 U/L (ref 11–51)

## 2016-11-23 MED ORDER — ACETAMINOPHEN 650 MG RE SUPP: 650.0000 mg | Freq: Four times a day (QID) | RECTAL | Status: DC | PRN

## 2016-11-23 MED ORDER — AMLODIPINE BESYLATE 10 MG PO TABS
10.0000 mg | ORAL_TABLET | Freq: Every day | ORAL | Status: DC
  Administered 2016-11-23 – 2016-11-26 (×4): 10 mg via ORAL
  Filled 2016-11-23 (×4): qty 1

## 2016-11-23 MED ORDER — SODIUM CHLORIDE 0.9 % IV SOLN
INTRAVENOUS | Status: DC
  Administered 2016-11-23 – 2016-11-26 (×3): via INTRAVENOUS

## 2016-11-23 MED ORDER — OXYCODONE HCL 5 MG PO TABS
5.0000 mg | ORAL_TABLET | ORAL | Status: DC | PRN
  Administered 2016-11-23 – 2016-11-27 (×9): 5 mg via ORAL
  Filled 2016-11-23 (×9): qty 1

## 2016-11-23 MED ORDER — BOOST / RESOURCE BREEZE PO LIQD: 1.0000 | Freq: Three times a day (TID) | ORAL | Status: DC

## 2016-11-23 MED ORDER — PRAVASTATIN SODIUM 20 MG PO TABS
40.0000 mg | ORAL_TABLET | Freq: Every evening | ORAL | Status: DC
  Administered 2016-11-23 – 2016-11-26 (×4): 40 mg via ORAL
  Filled 2016-11-23 (×4): qty 2

## 2016-11-23 MED ORDER — ONDANSETRON HCL 4 MG/2ML IJ SOLN
4.0000 mg | Freq: Four times a day (QID) | INTRAMUSCULAR | Status: DC | PRN
  Administered 2016-11-24: 4 mg via INTRAVENOUS
  Filled 2016-11-23: qty 2

## 2016-11-23 MED ORDER — HYDRALAZINE HCL 20 MG/ML IJ SOLN
10.0000 mg | INTRAMUSCULAR | Status: DC | PRN
  Administered 2016-11-25 – 2016-11-26 (×2): 10 mg via INTRAVENOUS
  Filled 2016-11-23 (×2): qty 1

## 2016-11-23 MED ORDER — FLUOXETINE HCL 10 MG PO CAPS
10.0000 mg | ORAL_CAPSULE | Freq: Every day | ORAL | Status: DC
  Administered 2016-11-23 – 2016-11-26 (×4): 10 mg via ORAL
  Filled 2016-11-23 (×4): qty 1

## 2016-11-23 MED ORDER — ACYCLOVIR 400 MG PO TABS
400.0000 mg | ORAL_TABLET | Freq: Two times a day (BID) | ORAL | Status: DC
  Administered 2016-11-23 – 2016-11-27 (×8): 400 mg via ORAL
  Filled 2016-11-23 (×8): qty 1

## 2016-11-23 MED ORDER — CYCLOBENZAPRINE HCL 5 MG PO TABS: 5.0000 mg | ORAL_TABLET | Freq: Three times a day (TID) | ORAL | Status: DC | PRN

## 2016-11-23 MED ORDER — PANTOPRAZOLE SODIUM 40 MG IV SOLR
40.0000 mg | Freq: Once | INTRAVENOUS | Status: AC
  Administered 2016-11-23: 40 mg via INTRAVENOUS
  Filled 2016-11-23: qty 40

## 2016-11-23 MED ORDER — ONDANSETRON 4 MG PO TBDP
4.0000 mg | ORAL_TABLET | Freq: Once | ORAL | Status: AC | PRN
  Administered 2016-11-23: 4 mg via ORAL
  Filled 2016-11-23: qty 1

## 2016-11-23 MED ORDER — MORPHINE SULFATE (PF) 2 MG/ML IV SOLN
2.0000 mg | INTRAVENOUS | Status: DC | PRN
  Administered 2016-11-23 – 2016-11-25 (×7): 2 mg via INTRAVENOUS
  Filled 2016-11-23 (×6): qty 1

## 2016-11-23 MED ORDER — ACETAMINOPHEN 325 MG PO TABS
650.0000 mg | ORAL_TABLET | Freq: Four times a day (QID) | ORAL | Status: DC | PRN
  Administered 2016-11-26: 650 mg via ORAL
  Filled 2016-11-23: qty 2

## 2016-11-23 MED ORDER — MORPHINE SULFATE (PF) 4 MG/ML IV SOLN
4.0000 mg | Freq: Once | INTRAVENOUS | Status: AC
  Administered 2016-11-23: 4 mg via INTRAVENOUS
  Filled 2016-11-23: qty 1

## 2016-11-23 MED ORDER — ISOSORBIDE MONONITRATE ER 30 MG PO TB24
60.0000 mg | ORAL_TABLET | Freq: Every day | ORAL | Status: DC
  Administered 2016-11-23 – 2016-11-27 (×5): 60 mg via ORAL
  Filled 2016-11-23 (×5): qty 2

## 2016-11-23 MED ORDER — ONDANSETRON HCL 4 MG/2ML IJ SOLN
4.0000 mg | Freq: Once | INTRAMUSCULAR | Status: AC
  Administered 2016-11-23: 4 mg via INTRAVENOUS
  Filled 2016-11-23: qty 2

## 2016-11-23 MED ORDER — PANTOPRAZOLE SODIUM 40 MG IV SOLR
40.0000 mg | Freq: Two times a day (BID) | INTRAVENOUS | Status: DC
  Administered 2016-11-23 – 2016-11-27 (×8): 40 mg via INTRAVENOUS
  Filled 2016-11-23 (×8): qty 40

## 2016-11-23 MED ORDER — ONDANSETRON HCL 4 MG PO TABS
4.0000 mg | ORAL_TABLET | Freq: Four times a day (QID) | ORAL | Status: DC | PRN
  Administered 2016-11-26 – 2016-11-27 (×2): 4 mg via ORAL
  Filled 2016-11-23 (×3): qty 1

## 2016-11-23 MED ORDER — ALBUTEROL SULFATE (2.5 MG/3ML) 0.083% IN NEBU: 2.5000 mg | INHALATION_SOLUTION | RESPIRATORY_TRACT | Status: DC | PRN

## 2016-11-23 MED ORDER — SODIUM CHLORIDE 0.9 % IV BOLUS (SEPSIS)
1000.0000 mL | Freq: Once | INTRAVENOUS | Status: AC
  Administered 2016-11-23: 1000 mL via INTRAVENOUS

## 2016-11-23 MED ORDER — HEPARIN SODIUM (PORCINE) 5000 UNIT/ML IJ SOLN
5000.0000 [IU] | Freq: Three times a day (TID) | INTRAMUSCULAR | Status: DC
  Administered 2016-11-23 – 2016-11-27 (×12): 5000 [IU] via SUBCUTANEOUS
  Filled 2016-11-23 (×12): qty 1

## 2016-11-23 MED ORDER — ENSURE ENLIVE PO LIQD
237.0000 mL | Freq: Two times a day (BID) | ORAL | Status: DC
  Administered 2016-11-24 – 2016-11-25 (×3): 237 mL via ORAL

## 2016-11-23 MED ORDER — PROCHLORPERAZINE EDISYLATE 5 MG/ML IJ SOLN: 5.0000 mg | Freq: Four times a day (QID) | INTRAMUSCULAR | Status: DC | PRN

## 2016-11-23 NOTE — ED Provider Notes (Signed)
[facility] DEPT Provider Note   CSN: [id number] Arrival date & time: 11/23/16  0759     History   Chief Complaint Chief Complaint  Patient presents with  . RLQ pain  . Emesis    HPI Abigail [name] is a 71 y.o. female.  Patient complains of left lower quadrant pain since being discharged from the hospital on Friday morning. Additionally, she has been vomiting and not keeping fluids down. This is a similar pain that she had when she was admitted recently. CT scan of abdomen on 11/19/16 revealed a 1.6 cm lesion on the T12 vertebral body most likely related to her multiple myeloma,but no acute pathology. Additionally she had a MRI of the abdomen on 11/17/16 which revealed multiple bilateral renal cysts, but once again no acute pathology. No fever, sweats, chills, chest pain, dyspnea, dysuria. Severity of symptoms is moderate.      Past Medical History:  Diagnosis Date  . Anemia 07/2015  . Bilateral renal artery stenosis ([place]) 1999   s/p stenting  . Coronary artery disease 1999  . Depression   . Diverticulosis   . Duodenitis   . Gastric erosions   . Gastric ulcer   . GERD (gastroesophageal reflux disease)   . Heart murmur   . History of blood transfusion ~ 03/2016   "low HgB; practically nonexistent"  . Hyperlipidemia   . Hypertension   . MI (myocardial infarction) 1999  . Multiple myeloma ([place])   . Retinal hemorrhage, right eye   . Schatzki's ring   . Tubular adenoma of colon     Patient Active Problem List   Diagnosis Date Noted  . Erosive esophagitis   . Protein-calorie malnutrition, severe 11/19/2016  . LLQ pain   . LUQ abdominal pain   . Intractable nausea and vomiting 11/17/2016  . Abdominal pain 11/05/2016  . Multiple myeloma ([place]) 06/18/2016  . Malnutrition of moderate degree 06/14/2016  . 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 Exertional dyspnea 11/01/2015  . Coronary atherosclerosis of native coronary artery 11/01/2015  .  Shortness of breath 11/01/2015  . Chest pain 12/31/2011    Past Surgical History:  Procedure Laterality Date  . APPENDECTOMY    . BACK SURGERY    . CATARACT EXTRACTION W/ INTRAOCULAR LENS IMPLANT Right 2014  . CORONARY ANGIOPLASTY WITH STENT PLACEMENT  1999  . ESOPHAGOGASTRODUODENOSCOPY N/A 11/03/2015   Procedure: ESOPHAGOGASTRODUODENOSCOPY (EGD);  Surgeon: Carol Ada, MD;  Location: [facility] ENDOSCOPY;  Service: Endoscopy;  Laterality: N/A;  . ESOPHAGOGASTRODUODENOSCOPY (EGD) WITH PROPOFOL N/A 11/18/2016   Procedure: ESOPHAGOGASTRODUODENOSCOPY (EGD) WITH PROPOFOL;  Surgeon: Ladene Artist, MD;  Location: [facility] ENDOSCOPY;  Service: Endoscopy;  Laterality: N/A;  . LUMBAR [place]    . RENAL ARTERY STENT  1999   bil RAS so ? bil vs unilateral stents  . RETINAL LASER PROCEDURE Right 2012   "bleeding"  . TONSILLECTOMY    . TOTAL ABDOMINAL HYSTERECTOMY      OB History    No data available       Home Medications    Prior to Admission medications   Medication Sig Start Date End Date Taking? Authorizing Provider  acyclovir (ZOVIRAX) 400 MG tablet TAKE 1 TABLET (400 MG TOTAL) BY MOUTH 2 (TWO) TIMES DAILY. 08/29/16  Yes Ladell Pier, MD  amLODipine (NORVASC) 10 MG tablet Take 10 mg by mouth at bedtime.  01/03/16  Yes Historical Provider, MD  Ascorbic Acid (VITAMIN C) 1000 MG tablet Take 1,000  mg by mouth daily.    Yes Historical Provider, MD  aspirin EC 81 MG tablet Take 81 mg by mouth daily.   Yes Historical Provider, MD  calcium carbonate (OS-CAL - DOSED IN MG OF ELEMENTAL CALCIUM) 1250 (500 Ca) MG tablet Take 1 tablet (500 mg of elemental calcium total) by mouth daily with breakfast. 06/19/16  Yes [name], MD  Cholecalciferol (VITAMIN D PO) Take 1 capsule by mouth daily.   Yes Historical Provider, MD  dexamethasone (DECADRON) 4 MG tablet TAKE 40 MG (10 TABLETS) ONCE A WEEK ON DAY OF VELCADE INJECTION 10/02/16  Yes Historical Provider, MD  docusate sodium (COLACE) 100 MG capsule  Take 1 capsule (100 mg total) by mouth 2 (two) times daily. 11/21/16  Yes Modena Jansky, MD  FLUoxetine (PROZAC) 10 MG capsule Take 10 mg by mouth at bedtime.    Yes Historical Provider, MD  HYDROcodone-acetaminophen (NORCO/VICODIN) 5-325 MG tablet Take 1-2 tablets by mouth every 4 (four) hours as needed for moderate pain or severe pain. 11/13/16  Yes Charlesetta Shanks, MD  isosorbide mononitrate (IMDUR) 60 MG 24 hr tablet Take 1 tablet (60 mg total) by mouth daily. 06/19/16  Yes [name], MD  KLOR-CON M10 10 MEQ tablet Take 10 mEq by mouth daily.  06/13/16  Yes Historical Provider, MD  ondansetron (ZOFRAN) 4 MG tablet Take 1 tablet (4 mg total) by mouth every 8 (eight) hours as needed for nausea or vomiting. 10/27/16  Yes Nicole Pisciotta, PA-C  oxyCODONE-acetaminophen (PERCOCET/ROXICET) 5-325 MG tablet  11/22/16  Yes Historical Provider, MD  pantoprazole (PROTONIX) 40 MG tablet Take 1 tablet (40 mg total) by mouth 2 (two) times daily before a meal. 11/21/16  Yes Modena Jansky, MD  polyethylene glycol (MIRALAX / GLYCOLAX) packet Take 17 g by mouth 2 (two) times daily. 11/21/16  Yes Modena Jansky, MD  pravastatin (PRAVACHOL) 40 MG tablet Take 40 mg by mouth every evening. 06/03/15  Yes Historical Provider, MD  prochlorperazine (COMPAZINE) 5 MG tablet Take 1 tablet (5 mg total) by mouth every 6 (six) hours as needed for nausea or vomiting. 11/03/16  Yes Ladell Pier, MD  REVLIMID 15 MG capsule  11/08/16  Yes Historical Provider, MD    Family History Family History  Problem Relation Age of Onset  . Colon cancer Neg Hx     Social History Social History  Substance Use Topics  . Smoking status: Current Every Day Smoker    Packs/day: 0.50    Years: 48.00    Types: Cigarettes  . Smokeless tobacco: Never Used  . Alcohol use No     Allergies   Sulfa antibiotics   Review of Systems Review of Systems  All other systems reviewed and are negative.    Physical Exam Updated Vital Signs BP  (!) 200/112   Pulse 83   Temp 98.5 F (36.9 C) (Oral)   Resp 18   SpO2 98%   Physical Exam  Constitutional: She is oriented to person, place, and time.  Appears dehydrated  HENT:  Head: Normocephalic and atraumatic.  Eyes: Conjunctivae are normal.  Neck: Neck supple.  Cardiovascular: Normal rate and regular rhythm.   Pulmonary/Chest: Effort normal and breath sounds normal.  Abdominal:  min left lower quadrant tenderness  Musculoskeletal: Normal range of motion.  Neurological: She is alert and oriented to person, place, and time.  Skin: Skin is warm and dry.  Psychiatric: She has a normal mood and affect. Her behavior is normal.  Nursing note and vitals reviewed.    ED Treatments / Results  Labs (all labs ordered are listed, but only abnormal results are displayed) Labs Reviewed  COMPREHENSIVE METABOLIC PANEL - Abnormal; Notable for the following:       Result Value   Glucose, Bld 139 (*)    ALT 12 (*)    All other components within normal limits  CBC - Abnormal; Notable for the following:    RDW 15.9 (*)    All other components within normal limits  URINALYSIS, ROUTINE W REFLEX MICROSCOPIC - Abnormal; Notable for the following:    Ketones, ur 5 (*)    Protein, ur 100 (*)    Squamous Epithelial / LPF 0-5 (*)    All other components within normal limits  LIPASE, BLOOD    EKG  EKG Interpretation None       Radiology No results found.  Procedures Procedures (including critical care time)  Medications Ordered in ED Medications  ondansetron (ZOFRAN-ODT) disintegrating tablet 4 mg (4 mg Oral Given 11/23/16 0817)  sodium chloride 0.9 % bolus 1,000 mL (0 mLs Intravenous Stopped 11/23/16 1155)  pantoprazole (PROTONIX) injection 40 mg (40 mg Intravenous Given 11/23/16 1002)  sodium chloride 0.9 % bolus 1,000 mL (0 mLs Intravenous Stopped 11/23/16 1325)  morphine 4 MG/ML injection 4 mg (4 mg Intravenous Given 11/23/16 1035)  ondansetron (ZOFRAN) injection 4 mg (4 mg  Intravenous Given 11/23/16 1034)  morphine 4 MG/ML injection 4 mg (4 mg Intravenous Given 11/23/16 1333)     Initial Impression / Assessment and Plan / ED Course  I have reviewed the triage vital signs and the nursing notes.  Pertinent labs & imaging results that were available during my care of the patient were reviewed by me and considered in my medical decision making (see chart for details).     White count and liver functions normal. Patient continues to have pain with nausea and vomiting despite intravenous fluids, antiemetics, pain meds, Protonix. Will consult general medicine.  Disc c Dr Waldron Labs.  Final Clinical Impressions(s) / ED Diagnoses   Final diagnoses:  LLQ pain    New Prescriptions New Prescriptions   No medications on file     Nat Christen, MD 11/23/16 [phone]

## 2016-11-23 NOTE — ED Notes (Signed)
Pt provided with ice chips to see if see can tolerate PO fluid

## 2016-11-23 NOTE — ED Triage Notes (Signed)
patient was discharged from hospital yesterday and patient reports started vomiting brown liquid again last night.  Patient continues to have RLQ pain, even when discharged.  Patient denies diarrhea.

## 2016-11-23 NOTE — H&P (Signed)
[facility] H&P   Patient Demographics:    Abigail [name], is a 71 y.o. female  MRN: [id number]   DOB - 09-Mar-1946  Admit Date - 11/23/2016  Outpatient Primary MD for the patient is Philis Fendt, MD  Referring MD/NP/PA: Dr Lacinda Axon  Outpatient Specialists: Deno Etienne Dr Learta Codding  Patient coming from: Home  Chief Complaint  Patient presents with  . RLQ pain  . Emesis      HPI:    Abigail [name]  is a 71 y.o. female, With past medical history of duodenitis/gastric erosions/gastric ulcer/GERD, anemia, multiple myeloma, hypertension, hyperlipidemia, CAD, depression, patient presents with complaints of nausea vomiting and abdominal pain developed overnight, patient with recent hospitalization for similar complaints with extensive workup including MRI abdomen, CT abdomen pelvis with no findings to explain her nausea and vomiting, EGD was significant for esophagitis, abdominal pain thought to be secondary to muscular skeletal versus constipation, patient Revlimid has been stopped on discharge, patient reports that after discharge she had no complaints, has been trying full liquid diet/soft diet, but report at 1 AM she started to have nausea and vomiting, at least 7 episodes at home, as well she had another episodes in ED despite receiving Zofran 2, denies coffee-ground emesis, fever, chills, cough, diarrhea or constipation, dysuria or polyuria, complaints of abdominal pain, constant over last 4 weeks, significant on palpation, no increased or change in intensity or quality. No significant lab abnormalities, I was called to admit given significant nausea and vomiting despite few doses of Zofran.    Review of systems:    In addition to the HPI above,  No Fever-chills, No Headache, No changes with Vision or hearing, No problems swallowing food or Liquids, No Chest pain, Cough or Shortness of Breath, Compressive  abdominal pain, nausea and vomiting, reports she is having regular bowel movement  No Blood in stool or Urine, No dysuria, No new skin rashes or bruises, No new joints pains-aches,  No new weakness, tingling, numbness in any extremity, No recent weight gain or loss, No polyuria, polydypsia or polyphagia, No significant Mental Stressors.  A full 10 point Review of Systems was done, except as stated above, all other Review of Systems were negative.   With Past History of the following :    Past Medical History:  Diagnosis Date  . Anemia 07/2015  . Bilateral renal artery stenosis ([place]) 1999   s/p stenting  . Coronary artery disease 1999  . Depression   . Diverticulosis   . Duodenitis   . Gastric erosions   . Gastric ulcer   . GERD (gastroesophageal reflux disease)   . Heart murmur   . History of blood transfusion ~ 03/2016   "low HgB; practically nonexistent"  . Hyperlipidemia   . Hypertension   . MI (myocardial infarction) 1999  . Multiple myeloma ([place])   . Retinal hemorrhage, right eye   . Schatzki's  ring   . Tubular adenoma of colon       Past Surgical History:  Procedure Laterality Date  . APPENDECTOMY    . BACK SURGERY    . CATARACT EXTRACTION W/ INTRAOCULAR LENS IMPLANT Right 2014  . CORONARY ANGIOPLASTY WITH STENT PLACEMENT  1999  . ESOPHAGOGASTRODUODENOSCOPY N/A 11/03/2015   Procedure: ESOPHAGOGASTRODUODENOSCOPY (EGD);  Surgeon: Carol Ada, MD;  Location: [facility] ENDOSCOPY;  Service: Endoscopy;  Laterality: N/A;  . ESOPHAGOGASTRODUODENOSCOPY (EGD) WITH PROPOFOL N/A 11/18/2016   Procedure: ESOPHAGOGASTRODUODENOSCOPY (EGD) WITH PROPOFOL;  Surgeon: Ladene Artist, MD;  Location: [facility] ENDOSCOPY;  Service: Endoscopy;  Laterality: N/A;  . LUMBAR [place]    . RENAL ARTERY STENT  1999   bil RAS so ? bil vs unilateral stents  . RETINAL LASER PROCEDURE Right 2012   "bleeding"  . TONSILLECTOMY    . TOTAL ABDOMINAL HYSTERECTOMY        Social History:     Social  History  Substance Use Topics  . Smoking status: Current Every Day Smoker    Packs/day: 0.50    Years: 48.00    Types: Cigarettes  . Smokeless tobacco: Never Used  . Alcohol use No     Lives - At home  Mobility - with assistance     Family History :     Family History  Problem Relation Age of Onset  . Colon cancer Neg Hx       Home Medications:   Prior to Admission medications   Medication Sig Start Date End Date Taking? Authorizing Provider  acyclovir (ZOVIRAX) 400 MG tablet TAKE 1 TABLET (400 MG TOTAL) BY MOUTH 2 (TWO) TIMES DAILY. 08/29/16  Yes Ladell Pier, MD  amLODipine (NORVASC) 10 MG tablet Take 10 mg by mouth at bedtime.  01/03/16  Yes Historical Provider, MD  Ascorbic Acid (VITAMIN C) 1000 MG tablet Take 1,000 mg by mouth daily.    Yes Historical Provider, MD  aspirin EC 81 MG tablet Take 81 mg by mouth daily.   Yes Historical Provider, MD  calcium carbonate (OS-CAL - DOSED IN MG OF ELEMENTAL CALCIUM) 1250 (500 Ca) MG tablet Take 1 tablet (500 mg of elemental calcium total) by mouth daily with breakfast. 06/19/16  Yes [name], MD  Cholecalciferol (VITAMIN D PO) Take 1 capsule by mouth daily.   Yes Historical Provider, MD  dexamethasone (DECADRON) 4 MG tablet TAKE 40 MG (10 TABLETS) ONCE A WEEK ON DAY OF VELCADE INJECTION 10/02/16  Yes Historical Provider, MD  docusate sodium (COLACE) 100 MG capsule Take 1 capsule (100 mg total) by mouth 2 (two) times daily. 11/21/16  Yes Modena Jansky, MD  FLUoxetine (PROZAC) 10 MG capsule Take 10 mg by mouth at bedtime.    Yes Historical Provider, MD  HYDROcodone-acetaminophen (NORCO/VICODIN) 5-325 MG tablet Take 1-2 tablets by mouth every 4 (four) hours as needed for moderate pain or severe pain. 11/13/16  Yes Charlesetta Shanks, MD  isosorbide mononitrate (IMDUR) 60 MG 24 hr tablet Take 1 tablet (60 mg total) by mouth daily. 06/19/16  Yes [name], MD  KLOR-CON M10 10 MEQ tablet Take 10 mEq by mouth daily.  06/13/16  Yes  Historical Provider, MD  ondansetron (ZOFRAN) 4 MG tablet Take 1 tablet (4 mg total) by mouth every 8 (eight) hours as needed for nausea or vomiting. 10/27/16  Yes Nicole Pisciotta, PA-C  oxyCODONE-acetaminophen (PERCOCET/ROXICET) 5-325 MG tablet  11/22/16  Yes Historical Provider, MD  pantoprazole (PROTONIX) 40 MG tablet Take 1  tablet (40 mg total) by mouth 2 (two) times daily before a meal. 11/21/16  Yes Modena Jansky, MD  polyethylene glycol (MIRALAX / GLYCOLAX) packet Take 17 g by mouth 2 (two) times daily. 11/21/16  Yes Modena Jansky, MD  pravastatin (PRAVACHOL) 40 MG tablet Take 40 mg by mouth every evening. 06/03/15  Yes Historical Provider, MD  prochlorperazine (COMPAZINE) 5 MG tablet Take 1 tablet (5 mg total) by mouth every 6 (six) hours as needed for nausea or vomiting. 11/03/16  Yes Ladell Pier, MD  REVLIMID 15 MG capsule  11/08/16  Yes Historical Provider, MD     Allergies:     Allergies  Allergen Reactions  . Sulfa Antibiotics Rash     Physical Exam:   Vitals  Blood pressure (!) 200/112, pulse 83, temperature 98.5 F (36.9 C), temperature source Oral, resp. rate 18, SpO2 98 %.   1. General Frail elderly female lying in bed in NAD,    2. Normal affect and insight, Not Suicidal or Homicidal, Awake Alert, Oriented X 3.  3. No F.N deficits, ALL C.Nerves Intact, Strength 5/5 all 4 extremities, Sensation intact all 4 extremities, Plantars down going.  4. Ears and Eyes appear Normal, Conjunctivae clear, PERRLA. Moist Oral Mucosa.  5. Supple Neck, No JVD, No cervical lymphadenopathy appriciated, No Carotid Bruits.  6. Symmetrical Chest wall movement, Good air movement bilaterally, CTAB.  7. RRR, No Gallops, Rubs or Murmurs, No Parasternal Heave.  8. Positive Bowel Sounds, Abdomen Soft, left upper abdominal pain tender to palpation,No rebound -guarding or rigidity.  9.  No Cyanosis, Normal Skin Turgor, No Skin Rash or Bruise.  10. Good muscle tone,  joints appear  normal , no effusions, Normal ROM.      Data Review:    CBC  Recent Labs Lab 11/16/16 1952 11/17/16 1425 11/18/16 0559 11/19/16 0406 11/20/16 0419 11/23/16 0930  WBC 6.6 6.7 3.7* 3.2* 2.9* 4.8  HGB 13.3 13.5 10.9* 10.3* 9.5* 13.2  HCT 40.6 42.8 33.4* 32.6* 29.2* 40.0  PLT 176 169 131* 124* 113* 212  MCV 91.9 93.7 91.8 94.8 93.6 90.3  MCH 30.1 29.5 29.9 29.9 30.4 29.8  MCHC 32.8 31.5 32.6 31.6 32.5 33.0  RDW 15.8* 15.9* 15.7* 16.3* 16.0* 15.9*  LYMPHSABS 1.2  --   --   --   --   --   MONOABS 0.8  --   --   --   --   --   EOSABS 0.0  --   --   --   --   --   BASOSABS 0.0  --   --   --   --   --    ------------------------------------------------------------------------------------------------------------------  Chemistries   Recent Labs Lab 11/16/16 1952 11/17/16 1425 11/18/16 0559 11/20/16 0419 11/23/16 0930  NA 137 141 143 144 137  K 4.0 3.9 3.6 3.1* 4.3  CL 106 106 114* 116* 101  CO2 _0 GLUCOSE 108* 122* 104* 100* 139*  BUN _1 <5* 7  CREATININE 0.89 0.89 0.78 0.84 0.82  CALCIUM 9.2 9.9 8.6* 7.9* 9.8  MG  --  2.1  --  1.7  --   AST 15 16  --   --  28  ALT 11* 12*  --   --  12*  ALKPHOS 79 80  --   --  84  BILITOT 0.8 0.7  --   --  1.1   ------------------------------------------------------------------------------------------------------------------ estimated creatinine clearance is  65.2 mL/min (by C-G formula based on SCr of 0.82 mg/dL). ------------------------------------------------------------------------------------------------------------------ No results for input(s): TSH, T4TOTAL, T3FREE, THYROIDAB in the last 72 hours.  Invalid input(s): FREET3  Coagulation profile No results for input(s): INR, PROTIME in the last 168 hours. ------------------------------------------------------------------------------------------------------------------- No results for input(s): DDIMER in the last 72  hours. -------------------------------------------------------------------------------------------------------------------  Cardiac Enzymes No results for input(s): CKMB, TROPONINI, MYOGLOBIN in the last 168 hours.  Invalid input(s): CK ------------------------------------------------------------------------------------------------------------------    Component Value Date/Time   BNP 679.5 (H) 06/12/2016 2204     ---------------------------------------------------------------------------------------------------------------  Urinalysis    Component Value Date/Time   COLORURINE YELLOW 11/23/2016 [place] 11/23/2016 0954   LABSPEC 1.009 11/23/2016 0954   LABSPEC 1.010 10/30/2016 1522   PHURINE 8.0 11/23/2016 0954   GLUCOSEU NEGATIVE 11/23/2016 0954   GLUCOSEU Negative 10/30/2016 1522   HGBUR NEGATIVE 11/23/2016 0954   BILIRUBINUR NEGATIVE 11/23/2016 0954   BILIRUBINUR Negative 10/30/2016 1522   KETONESUR 5 (A) 11/23/2016 0954   PROTEINUR 100 (A) 11/23/2016 0954   UROBILINOGEN 0.2 10/30/2016 1522   NITRITE NEGATIVE 11/23/2016 0954   LEUKOCYTESUR NEGATIVE 11/23/2016 0954   LEUKOCYTESUR Trace 10/30/2016 1522    ----------------------------------------------------------------------------------------------------------------   Imaging Results:    No results found.    Assessment & Plan:    Active Problems:   Multiple myeloma (H[facility])   Abdominal pain   Intractable nausea and vomiting   LLQ pain  Intractable nausea and vomiting - Recent admission with similar presentation, extensive workup includes CT abdomen and pelvis, MRI abdomen, no acute findings to explain her nausea or vomiting or abdominal pain, EGD significant for esophagitis , as well REVLIMID is held as it wasn't clear that was contributing to her symptoms, he presents again today with nausea and vomiting and left lower quadrant abdominal pain. - This is most likely due to esophagitis, will keep on  IV fluid, clear liquid diet, will continue with the Protonix 40 mg IV twice a day, continue with when necessary nausea and pain medication. - Follow on abdominal x-ray  Left lower quadrant abdominal pain - Workup during previous admission musculoskeletal versus constipation, he currently denies any constipation, reports pain usually after nausea and vomiting, so most likely musculoskeletal from significant vomiting, will start on Flexeril.  Multiple myeloma - She is following with Dr. Learta Codding, Revlimid has been on hold since discharge per oncology recommendation, to follow with oncology after discharge.  Hypertension - Continue with amlodipine, Imdur and beta blockers  Hyperlipidemia - Continue statin  CAD -Denies any chest pain or shortness of breath, will hold aspirin given esophagitis, continue with statin and beta blockers  Depression - Continue with Prozac   DVT Prophylaxis Heparin -  SCDs  AM Labs Ordered, also please review Full Orders  Family Communication: Admission, patients condition and plan of care including tests being ordered have been discussed with the patient and sister who indicate understanding and agree with the plan and Code Status.  Code Status Full  Likely DC to  Home  Condition GUARDED    Consults called: None  Admission status: observation  Time spent in minutes : 55 minutes   ELGERGAWY, DAWOOD M.D on 11/23/2016 at 3:34 PM  Between 7am to 7pm - Pager - [phone]. After 7pm go to [email or web address] - password [facility]  [facility] - Office  [phone]

## 2016-11-23 NOTE — ED Notes (Signed)
Unsuccessful lab draw x1. 

## 2016-11-24 DIAGNOSIS — F329 Major depressive disorder, single episode, unspecified: Secondary | ICD-10-CM | POA: Diagnosis present

## 2016-11-24 DIAGNOSIS — D649 Anemia, unspecified: Secondary | ICD-10-CM | POA: Diagnosis present

## 2016-11-24 DIAGNOSIS — G43A1 Cyclical vomiting, intractable: Secondary | ICD-10-CM | POA: Diagnosis not present

## 2016-11-24 DIAGNOSIS — K5900 Constipation, unspecified: Secondary | ICD-10-CM | POA: Diagnosis present

## 2016-11-24 DIAGNOSIS — Z79899 Other long term (current) drug therapy: Secondary | ICD-10-CM | POA: Diagnosis not present

## 2016-11-24 DIAGNOSIS — R112 Nausea with vomiting, unspecified: Secondary | ICD-10-CM | POA: Diagnosis present

## 2016-11-24 DIAGNOSIS — Z7982 Long term (current) use of aspirin: Secondary | ICD-10-CM | POA: Diagnosis not present

## 2016-11-24 DIAGNOSIS — D61818 Other pancytopenia: Secondary | ICD-10-CM | POA: Diagnosis present

## 2016-11-24 DIAGNOSIS — N281 Cyst of kidney, acquired: Secondary | ICD-10-CM | POA: Diagnosis present

## 2016-11-24 DIAGNOSIS — E785 Hyperlipidemia, unspecified: Secondary | ICD-10-CM | POA: Diagnosis present

## 2016-11-24 DIAGNOSIS — C9000 Multiple myeloma not having achieved remission: Secondary | ICD-10-CM | POA: Diagnosis present

## 2016-11-24 DIAGNOSIS — Z8711 Personal history of peptic ulcer disease: Secondary | ICD-10-CM | POA: Diagnosis not present

## 2016-11-24 DIAGNOSIS — I700 Atherosclerosis of aorta: Secondary | ICD-10-CM | POA: Diagnosis present

## 2016-11-24 DIAGNOSIS — R1032 Left lower quadrant pain: Secondary | ICD-10-CM | POA: Diagnosis present

## 2016-11-24 DIAGNOSIS — I2510 Atherosclerotic heart disease of native coronary artery without angina pectoris: Secondary | ICD-10-CM | POA: Diagnosis present

## 2016-11-24 DIAGNOSIS — K210 Gastro-esophageal reflux disease with esophagitis: Secondary | ICD-10-CM | POA: Diagnosis present

## 2016-11-24 DIAGNOSIS — Z882 Allergy status to sulfonamides status: Secondary | ICD-10-CM | POA: Diagnosis not present

## 2016-11-24 DIAGNOSIS — K449 Diaphragmatic hernia without obstruction or gangrene: Secondary | ICD-10-CM | POA: Diagnosis present

## 2016-11-24 DIAGNOSIS — Z955 Presence of coronary angioplasty implant and graft: Secondary | ICD-10-CM | POA: Diagnosis not present

## 2016-11-24 DIAGNOSIS — F17210 Nicotine dependence, cigarettes, uncomplicated: Secondary | ICD-10-CM | POA: Diagnosis present

## 2016-11-24 DIAGNOSIS — I252 Old myocardial infarction: Secondary | ICD-10-CM | POA: Diagnosis not present

## 2016-11-24 DIAGNOSIS — I10 Essential (primary) hypertension: Secondary | ICD-10-CM | POA: Diagnosis present

## 2016-11-24 DIAGNOSIS — R1030 Lower abdominal pain, unspecified: Secondary | ICD-10-CM | POA: Diagnosis not present

## 2016-11-24 DIAGNOSIS — R1010 Upper abdominal pain, unspecified: Secondary | ICD-10-CM

## 2016-11-24 DIAGNOSIS — K2980 Duodenitis without bleeding: Secondary | ICD-10-CM | POA: Diagnosis present

## 2016-11-24 DIAGNOSIS — Z79891 Long term (current) use of opiate analgesic: Secondary | ICD-10-CM | POA: Diagnosis not present

## 2016-11-24 LAB — BASIC METABOLIC PANEL
ANION GAP: 8 (ref 5–15)
BUN: 8 mg/dL (ref 6–20)
CALCIUM: 8.3 mg/dL — AB (ref 8.9–10.3)
CO2: 24 mmol/L (ref 22–32)
Chloride: 110 mmol/L (ref 101–111)
Creatinine, Ser: 0.96 mg/dL (ref 0.44–1.00)
GFR, EST NON AFRICAN AMERICAN: 59 mL/min — AB (ref >60)
Glucose, Bld: 92 mg/dL (ref 65–99)
POTASSIUM: 3.7 mmol/L (ref 3.5–5.1)
Sodium: 142 mmol/L (ref 135–145)

## 2016-11-24 LAB — HEPATIC FUNCTION PANEL
ALBUMIN: 3.6 g/dL (ref 3.5–5.0)
ALT: 11 U/L — AB (ref 14–54)
AST: 20 U/L (ref 15–41)
Alkaline Phosphatase: 63 U/L (ref 38–126)
BILIRUBIN DIRECT: 0.2 mg/dL (ref 0.1–0.5)
BILIRUBIN TOTAL: 0.4 mg/dL (ref 0.3–1.2)
Indirect Bilirubin: 0.2 mg/dL — ABNORMAL LOW (ref 0.3–0.9)
Total Protein: 6.1 g/dL — ABNORMAL LOW (ref 6.5–8.1)

## 2016-11-24 MED ORDER — POLYETHYLENE GLYCOL 3350 17 G PO PACK
17.0000 g | PACK | Freq: Two times a day (BID) | ORAL | Status: DC
  Administered 2016-11-25 – 2016-11-26 (×2): 17 g via ORAL
  Filled 2016-11-24 (×3): qty 1

## 2016-11-24 NOTE — Care Management Note (Signed)
Case Management Note  Patient Details  Name: MAYLIN KESSELRING MRN: [id number] Date of Birth: 1946-10-06  Subjective/Objective: 71 y/o f admitted w/intractable n/v.From home.                   Action/Plan:d/c plan home.   Expected Discharge Date:                  Expected Discharge Plan:  Home/Self Care  In-House Referral:     Discharge planning Services  CM Consult  Post Acute Care Choice:    Choice offered to:     DME Arranged:    DME Agency:     HH Arranged:    HH Agency:     Status of Service:  In process, will continue to follow  If discussed at Long Length of Stay Meetings, dates discussed:    Additional Comments:  Dessa Phi, RN 11/24/2016, 3:11 PM

## 2016-11-24 NOTE — Progress Notes (Signed)
Initial Nutrition Assessment  DOCUMENTATION CODES:   Severe malnutrition in context of acute illness/injury  INTERVENTION:   Continue Boost Breeze po TID, each supplement provides 250 kcal and 9 grams of protein Continue Ensure Enlive po BID, each supplement provides 350 kcal and 20 grams of protein RD to continue to monitor for plan  NUTRITION DIAGNOSIS:   Malnutrition related to chronic illness, acute illness as evidenced by moderate depletion of body fat, moderate depletions of muscle mass, percent weight loss.  GOAL:   Patient will meet greater than or equal to 90% of their needs  MONITOR:   PO intake, Supplement acceptance, Labs, Weight trends, I & O's  REASON FOR ASSESSMENT:   Malnutrition Screening Tool    ASSESSMENT:   71 y.o. female, With past medical history of duodenitis/gastric erosions/gastric ulcer/GERD, anemia, multiple myeloma, hypertension, hyperlipidemia, CAD, depression, patient presents with complaints of nausea vomiting and abdominal pain developed overnight, patient with recent hospitalization for similar complaints with extensive workup including MRI abdomen, CT abdomen pelvis with no findings to explain her nausea and vomiting, EGD was significant for esophagitis, abdominal pain thought to be secondary to muscular skeletal versus constipation, patient Revlimid has been stopped on discharge, patient reports that after discharge she had no complaints, has been trying full liquid diet/soft diet, but report at 1 AM she started to have nausea and vomiting, at least 7 episodes at home  Patient familiar to RD from previous admission (1/31). Pt presents with similar symptoms, N/V, abdominal pain. Pt was eating 100% of meals prior to discharge from last admission 2/2. Since then pt developed N/V again (7 episodes PTA). Now on clear liquids, drinking Ensure supplements.   Pt's weight continues to decrease with 12% weight loss over the last month which is significant  for time frame. Pt with mild fat and moderate muscle depletion as found in NFPE from 1/31.   Labs reviewed. Medications: IV Protonix every 12 hours, Miralax packet BID  Diet Order:  Diet clear liquid Room service appropriate? Yes; Fluid consistency: Thin Diet NPO time specified Diet NPO time specified  Skin:  Reviewed, no issues  Last BM:  2/3  Height:   Ht Readings from Last 1 Encounters:  11/23/16 _0  (1.651 m)    Weight:   Wt Readings from Last 1 Encounters:  11/23/16 154 lb (69.9 kg)    Ideal Body Weight:  56.8 kg  BMI:  Body mass index is 25.63 kg/m.  Estimated Nutritional Needs:   Kcal:  2100-2300  Protein:  105-115g  Fluid:  2L/day  EDUCATION NEEDS:   No education needs identified at this time  Abigail Bibles, MS, RD, LDN Pager: [phone] After Hours Pager: [phone]

## 2016-11-24 NOTE — Care Management Obs Status (Signed)
[facility] NOTIFICATION   Patient Details  Name: Abigail [name] MRN: [id number] Date of Birth: November 22, 1945   Medicare Observation Status Notification Given:  Yes    MahabirJuliann Pulse, RN 11/24/2016, 3:11 PM

## 2016-11-24 NOTE — Progress Notes (Signed)
[facility] PROGRESS NOTE  Abigail [name] NLG:[id number] DOB: 1946-08-18 DOA: 11/23/2016   PCP: Philis Fendt, MD     Assessment/Plan: Active Problems:   Multiple myeloma ([place])   Abdominal pain   Intractable nausea and vomiting   LLQ pain   Abigail [name]  is a 71 y.o. female, With past medical history of duodenitis/gastric erosions/gastric ulcer/GERD, anemia, multiple myeloma, hypertension, hyperlipidemia, CAD, depression, patient presents with complaints of nausea vomiting and abdominal pain developed overnight, patient with recent hospitalization for similar complaints with extensive workup including MRI abdomen, CT abdomen pelvis with no findings to explain her nausea and vomiting, EGD was significant for esophagitis, abdominal pain thought to be secondary to muscular skeletal versus constipation, patient Revlimid has been stopped on discharge, patient reports that after discharge she had no complaints, has been trying full liquid diet/soft diet, but report at 1 AM she started to have nausea and vomiting, at least 7 episodes at home   Assessment and plan Intractable nausea and vomiting: Recent admission with similar presentation, extensive workup includes CT abdomen and pelvis, MRI abdomen, no acute findings to explain her nausea or vomiting or abdominal pain, EGD significant for esophagitis , as well REVLIMID is held as it wasn't clear that was contributing to her symptoms. [facility] was consulted last admission. She underwent EGD (detailed results as below) which essentially showed grade D esophagitis, moderate intrinsic stenosis at GEJ and erythematous due to neuropathy. She was treated supportively and her diet was gradually advanced.  GI recommends twice a day PPI and outpatient follow-up  . Lipase normal on admission. No diarrhea. UA negative.Outpatient GI follow up with Dr. Hilarie Fredrickson, nauseous today,no BM since 2/3 .Will order HIDA scan to r/o biliary dyskinesia    Left lower  quadrant abdominal pain/constipation: Etiology of this is not fully clear. She had been having ongoing pain for >3 weeks. CT abdomen and MR unrevealing. Since she had not had a BM for a few days, [facility] ordered bowel purge with MoviPrep and patient has been having BMs since. Abdominal pain has improved.    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 Her myeloma has done quite well with treatment but she has been off of medications for a couple of weeks due to abdominal pain, nausea and vomiting. Follow CBCs as outpatient next week.She is following with Dr. Learta Codding, Revlimid has been on hold since discharge per oncology recommendation  Bilateral renal cysts: Outpatient follow-up. MR abdomen done to evaluate right renal lesion seen on recent CT and ultrasound examinations. As per report, the 2 lesions in question on the CT scan are both hemorrhagic or proteinaceous cysts. No worrisome contrast enhancement.  Multiple myeloma: Discussion as above. Discussed with her oncologist who will see her in the office next week. Lytic lesions in the spine noted on CT. Patient apparently is on Zometa every 3 months. Oncology recommends holding Revlimid, Velcade, Decadron for now (holding for several weeks now).  Essential hypertension: Reasonably controlled on amlodipine, Imdur.  Hyperlipidemia: Continue statins.  CAD: Asymptomatic of chest pain. Continue aspirin, statins and beta blockers.  Depression: No suicidal or homicidal ideations. Continue Prozac.    DVT prophylaxsis heparin  Code Status:  Full code    Family Communication: Discussed in detail with the patient, all imaging results, lab results explained to the patient   Disposition Plan:  1-2 days  Brief  narrative: None  Consultants:  None  Procedures:  None  Antibiotics: Anti-infectives    Start     Dose/Rate Route Frequency Ordered Stop    11/23/16 2200  acyclovir (ZOVIRAX) tablet 400 mg     400 mg Oral 2 times daily 11/23/16 1644           HPI/Subjective: 2 episodes of nausea and vomiting today, not eating very much  Objective: Vitals:   11/23/16 1535 11/23/16 1636 11/23/16 2027 11/24/16 0410  BP: 162/95 (!) 148/102 99/84 114/64  Pulse: 67 69 84 (!) 58  Resp: _0 Temp:  99 F (37.2 C) 98.3 F (36.8 C) 99 F (37.2 C)  TempSrc:  Oral Oral Oral  SpO2: 96% 99% 100% 98%  Weight:  69.9 kg (154 lb)    Height:  _1  (1.651 m)      Intake/Output Summary (Last 24 hours) at 11/24/16 0841 Last data filed at 11/23/16 1800  Gross per 24 hour  Intake            162.5 ml  Output              250 ml  Net            -87.5 ml    Exam:  Examination:  General exam: Appears calm and comfortable  Respiratory system: Clear to auscultation. Respiratory effort normal. Cardiovascular system: S1 & S2 heard, RRR. No JVD, murmurs, rubs, gallops or clicks. No pedal edema. Gastrointestinal system: Abdomen is nondistended, soft and nontender. No organomegaly or masses felt. Normal bowel sounds heard. Central nervous system: Alert and oriented. No focal neurological deficits. Extremities: Symmetric 5 x 5 power. Skin: No rashes, lesions or ulcers Psychiatry: Judgement and insight appear normal. Mood & affect appropriate.     Data Reviewed: I have personally reviewed following labs and imaging studies  Micro Results No results found for this or any previous visit (from the past 240 hour(s)).  Radiology Reports Dg Abdomen 1 View  Result Date: 10/27/2016 CLINICAL DATA:  71 year old female with a history of constipation and vomiting EXAM: ABDOMEN - 1 VIEW COMPARISON:  CT 07/24/2015, plain film 06/13/2016 FINDINGS: Gas within stomach, small bowel, colon.  No abnormal distention. Calcified density in the left lateral abdomen, of uncertain location. Pelvic phleboliths. Vascular stents of renal arteries again demonstrated.  Multilevel degenerative changes of the lumbar spine. Vascular calcifications of the abdominal aorta. No radiopaque foreign body. No displaced fracture IMPRESSION: Nonobstructive bowel gas pattern. Small calcification within the left mid abdomen, favored to be within the fecal stream, although could represent kidney calcification/nephrolithiasis. Aortic atherosclerosis. Vascular stents of the renal arteries. Signed, Dulcy Fanny. Earleen Newport, DO [facility] Radiology Electronically Signed   By: Corrie Mckusick D.O.   On: 10/27/2016 09:12   Mr Abdomen W Wo Contrast  Result Date: 11/18/2016 CLINICAL DATA:  Evaluate right renal lesion seen on recent CT and ultrasound examinations. EXAM: MRI ABDOMEN WITHOUT AND WITH CONTRAST TECHNIQUE: Multiplanar multisequence MR imaging of the abdomen was performed both before and after the administration of intravenous contrast. CONTRAST:  26m MULTIHANCE GADOBENATE DIMEGLUMINE 529 MG/ML IV SOLN COMPARISON:  CT scan 10/27/2016 and ultrasound 11/10/2016 FINDINGS: Lower chest: The lung bases are grossly clear. No worrisome pulmonary lesions. No pleural or pericardial effusion. Hepatobiliary: No focal hepatic lesions or intrahepatic biliary dilatation. The gallbladder is normal. No common bile duct dilatation. The portal and hepatic veins are normal. Pancreas:  No mass, inflammation or  ductal dilatation. Spleen:  Normal size.  No focal lesions. Adrenals/Urinary Tract:  The adrenal glands are unremarkable. There are multiple small bilateral renal cysts. The lower pole right renal lesion demonstrates increased T1 signal intensity and no contrast enhancement. This is consistent with a benign hemorrhagic or proteinaceous cyst. The small interpolar lesion involving the left kidney is also a small hemorrhagic or proteinaceous cyst. No worrisome renal lesions. No hydronephrosis. Stomach/Bowel: Visualized portions within the abdomen are unremarkable.  Vascular/Lymphatic: Normal caliber aorta. Moderate atherosclerotic calcifications. Calcifications noted at the major branch vessel ostia. Bilateral renal artery stents are noted. No aneurysm or dissection. The major venous structures are patent. No mesenteric or retroperitoneal mass or adenopathy. Other: No ascites or abdominal wall hernia. No subcutaneous lesions. Musculoskeletal: Small scattered hemangiomas are noted. IMPRESSION: 1. Multiple bilateral renal cysts. Most of these are simple cysts. The 2 lesions in question on the CT scan are both hemorrhagic or proteinaceous cysts. No worrisome contrast enhancement. 2. No acute abdominal findings, mass lesions or adenopathy. Electronically Signed   By: Marijo Sanes M.D.   On: 11/18/2016 10:07   Ct Abdomen Pelvis W Contrast  Result Date: 11/19/2016 CLINICAL DATA:  Left lower quadrant pain.  Nausea and vomiting. EXAM: CT ABDOMEN AND PELVIS WITH CONTRAST TECHNIQUE: Multidetector CT imaging of the abdomen and pelvis was performed using the standard protocol following bolus administration of intravenous contrast. CONTRAST:  140m ISOVUE-300 IOPAMIDOL (ISOVUE-300) INJECTION 61% COMPARISON:  Abdominal MRI 11/18/2016. CT abdomen and pelvis 10/27/2016. FINDINGS: Lower chest: Subsegmental atelectasis in the lung bases. At most trace pleural fluid bilaterally. Partially visualize coronary artery atherosclerosis. Hepatobiliary: No focal liver abnormality is seen. No gallstones, gallbladder wall thickening, or biliary dilatation. Pancreas: Unremarkable. Spleen: Unremarkable. Adrenals/Urinary Tract: Unremarkable adrenal glands. Small bilateral renal lesions more fully characterized on yesterday's MRI. No hydronephrosis. No renal calculi. Unremarkable bladder. Stomach/Bowel: Small sliding hiatal hernia. No evidence of bowel obstruction. Scattered left-sided colonic diverticula without evidence of diverticulitis. Prior appendectomy. Vascular/Lymphatic: Abdominal aortic  atherosclerosis without aneurysm. Bilateral renal stents which are grossly patent. No enlarged lymph nodes in the abdomen or pelvis. Reproductive: Status post hysterectomy. No adnexal masses. Other: No intraperitoneal free fluid. No abdominal wall mass or hernia. Musculoskeletal: 1.6 cm lytic lesion in the right aspect of the T12 vertebral body, enlarged from 07/24/2015 an new from 12/31/2011. Unchanged L1 vertebral body hemangioma. Grade 1 retrolisthesis of T12 on L1 and L1 on L2. Multilevel disc degeneration, advanced at T12-L1 and L5-S1. IMPRESSION: 1. No acute abnormality identified in the abdomen or pelvis. 2. 1.6 cm T12 vertebral body lesion, enlarged from 2016. This likely reflects patient's known multiple myeloma, although a metastasis from a non-hematologic malignancy is also possible. 3. Aortic atherosclerosis. Electronically Signed   By: ALogan BoresM.D.   On: 11/19/2016 15:05   Ct Abdomen Pelvis W Contrast  Result Date: 10/27/2016 CLINICAL DATA:  Vomiting since yesterday.  No pain. EXAM: CT ABDOMEN AND PELVIS WITH CONTRAST TECHNIQUE: Multidetector CT imaging of the abdomen and pelvis was performed using the standard protocol following bolus administration of intravenous contrast. CONTRAST:  1083mISOVUE-300 IOPAMIDOL (ISOVUE-300) INJECTION 61% COMPARISON:  07/24/2015 FINDINGS: Lower chest: No acute abnormality. Hepatobiliary: No focal liver abnormality is seen. No gallstones, gallbladder wall thickening, or biliary dilatation. Pancreas: Unremarkable. No pancreatic ductal dilatation or surrounding inflammatory changes. Spleen: Normal in size without focal abnormality. Adrenals/Urinary Tract: Adrenal glands are unremarkable. No urolithiasis or obstructive uropathy. 16.8 cm right inferior pole renal mass measuring 52 Hounsfield units which is  new compared with 07/24/2015. Multiple hypodense, fluid attenuating left renal mass is most consistent with cysts with the largest measuring 2.3 cm in the mid  pole. 8.3 mm indeterminate hypodense left anterior interpolar mass (image 36/series 2). Bladder is unremarkable. Stomach/Bowel: Stomach is within normal limits. No evidence of bowel wall thickening, distention, or inflammatory changes. Vascular/Lymphatic: Abdominal aortic atherosclerosis. A normal caliber abdominal aorta. No lymphadenopathy. Reproductive: Status post hysterectomy. No adnexal masses. Other: No abdominal wall hernia or abnormality. No abdominopelvic ascites. Musculoskeletal: No lytic or sclerotic osseous lesion. No acute osseous abnormality. Mild osteoarthritis bilateral hips. Severe degenerative disc disease with disc height loss at L5-S1. Degenerative disc disease with severe disc height loss at T12-L1 and L1-2. Minimal retrolisthesis of L1 on L2 and T12 on L1. Mild osteoarthritis of bilateral sacroiliac joints. IMPRESSION: 1. No acute abdominal or pelvic pathology. 2. 16.8 cm indeterminate right inferior pole renal mass new compared with 07/24/2015. Similar smaller 8.3 mm indeterminate left anterior interpolar renal mass. Recommend further evaluation with MRI or CT of the abdomen without and with intravenous contrast. Electronically Signed   By: Kathreen Devoid   On: 10/27/2016 14:21   Dg Abdomen Acute W/chest  Result Date: 11/15/2016 CLINICAL DATA:  Initial evaluation for acute left lower quadrant pain for 3 weeks. History of multiple myeloma. EXAM: DG ABDOMEN ACUTE W/ 1V CHEST COMPARISON:  Prior CT from 10/27/2016. FINDINGS: Cardiac and mediastinal silhouettes are within normal limits. Scattered atheromatous plaque noted within the aortic arch. Lungs normally inflated. Linear opacities of the left lung base most compatible with atelectasis. No other focal airspace disease. No pulmonary edema or pleural effusion. No pneumothorax. Bowel gas pattern within normal limits without evidence for obstruction or ileus. No abnormal bowel wall thickening. Moderate to large amount of retained stool within  the right colon. No soft tissue mass or abnormal calcification. Vascular stent overlies the left upper abdomen. No acute osseous abnormality. IMPRESSION: 1. Nonobstructive bowel gas pattern with no radiographic evidence for acute intra-abdominal process. 2. Moderate to large amount of retained stool within the right colon. 3. Mild left basilar atelectasis. No other active cardiopulmonary disease. 4. Aortic atherosclerosis. Electronically Signed   By: Jeannine Boga M.D.   On: 11/15/2016 00:07   Dg Abd Portable 1v  Result Date: 11/23/2016 CLINICAL DATA:  Vomiting.  Right lower quadrant pain. EXAM: PORTABLE ABDOMEN - 1 VIEW COMPARISON:  November 14, 2016 FINDINGS: Vascular stent in the left upper quadrant. No free air, portal venous gas, or pneumatosis. No bowel obstruction. Fecal loading in the colon. Mottled appearance to the bones, likely due to the reported history of multiple myeloma. IMPRESSION: Fecal loading in the colon.  No other acute abnormalities. Electronically Signed   By: Dorise Bullion III M.D   On: 11/23/2016 15:53     CBC  Recent Labs Lab 11/17/16 1425 11/18/16 0559 11/19/16 0406 11/20/16 0419 11/23/16 0930  WBC 6.7 3.7* 3.2* 2.9* 4.8  HGB 13.5 10.9* 10.3* 9.5* 13.2  HCT 42.8 33.4* 32.6* 29.2* 40.0  PLT 169 131* 124* 113* 212  MCV 93.7 91.8 94.8 93.6 90.3  MCH 29.5 29.9 29.9 30.4 29.8  MCHC 31.5 32.6 31.6 32.5 33.0  RDW 15.9* 15.7* 16.3* 16.0* 15.9*    Chemistries   Recent Labs Lab 11/17/16 1425 11/18/16 0559 11/20/16 0419 11/23/16 0930 11/24/16 0424  NA 141 143 144 137 142  K 3.9 3.6 3.1* 4.3 3.7  CL 106 114* 116* 101 110  CO2 _0 GLUCOSE  122* 104* 100* 139* 92  BUN 11 8 <5* 7 8  CREATININE 0.89 0.78 0.84 0.82 0.96  CALCIUM 9.9 8.6* 7.9* 9.8 8.3*  MG 2.1  --  1.7  --   --   AST 16  --   --  28  --   ALT 12*  --   --  12*  --   ALKPHOS 80  --   --  84  --   BILITOT 0.7  --   --  1.1  --     ------------------------------------------------------------------------------------------------------------------ estimated creatinine clearance is 54.3 mL/min (by C-G formula based on SCr of 0.96 mg/dL). ------------------------------------------------------------------------------------------------------------------ No results for input(s): HGBA1C in the last 72 hours. ------------------------------------------------------------------------------------------------------------------ No results for input(s): CHOL, HDL, LDLCALC, TRIG, CHOLHDL, LDLDIRECT in the last 72 hours. ------------------------------------------------------------------------------------------------------------------ No results for input(s): TSH, T4TOTAL, T3FREE, THYROIDAB in the last 72 hours.  Invalid input(s): FREET3 ------------------------------------------------------------------------------------------------------------------ No results for input(s): VITAMINB12, FOLATE, FERRITIN, TIBC, IRON, RETICCTPCT in the last 72 hours.  Coagulation profile No results for input(s): INR, PROTIME in the last 168 hours.  No results for input(s): DDIMER in the last 72 hours.  Cardiac Enzymes No results for input(s): CKMB, TROPONINI, MYOGLOBIN in the last 168 hours.  Invalid input(s): CK ------------------------------------------------------------------------------------------------------------------ Invalid input(s): POCBNP   CBG: No results for input(s): GLUCAP in the last 168 hours.     Studies: Dg Abd Portable 1v  Result Date: 11/23/2016 CLINICAL DATA:  Vomiting.  Right lower quadrant pain. EXAM: PORTABLE ABDOMEN - 1 VIEW COMPARISON:  November 14, 2016 FINDINGS: Vascular stent in the left upper quadrant. No free air, portal venous gas, or pneumatosis. No bowel obstruction. Fecal loading in the colon. Mottled appearance to the bones, likely due to the reported history of multiple myeloma. IMPRESSION: Fecal loading in  the colon.  No other acute abnormalities. Electronically Signed   By: Dorise Bullion III M.D   On: 11/23/2016 15:53      Lab Results  Component Value Date   HGBA1C 5.7 (H) 12/31/2011   Lab Results  Component Value Date   LDLCALC 28 11/02/2015   CREATININE 0.96 11/24/2016       Scheduled Meds: . acyclovir  400 mg Oral BID  . amLODipine  10 mg Oral QHS  . feeding supplement  1 Container Oral TID BM  . feeding supplement (ENSURE ENLIVE)  237 mL Oral BID BM  . FLUoxetine  10 mg Oral QHS  . heparin  5,000 Units Subcutaneous Q8H  . isosorbide mononitrate  60 mg Oral Daily  . pantoprazole (PROTONIX) IV  40 mg Intravenous Q12H  . pravastatin  40 mg Oral QPM   Continuous Infusions: . sodium chloride 50 mL/hr at 11/23/16 1645     LOS: 0 days    Time spent: >30 MINS    [facility]  [facility] Pager [phone]. If 7PM-7AM, please contact night-coverage at [email or web address], password [facility] 11/24/2016, 8:41 AM  LOS: 0 days

## 2016-11-25 ENCOUNTER — Inpatient Hospital Stay (HOSPITAL_COMMUNITY): Payer: Medicare HMO

## 2016-11-25 DIAGNOSIS — R112 Nausea with vomiting, unspecified: Secondary | ICD-10-CM

## 2016-11-25 DIAGNOSIS — R1030 Lower abdominal pain, unspecified: Secondary | ICD-10-CM

## 2016-11-25 MED ORDER — BISACODYL 10 MG RE SUPP
10.0000 mg | Freq: Once | RECTAL | Status: AC
  Administered 2016-11-25: 10 mg via RECTAL
  Filled 2016-11-25: qty 1

## 2016-11-25 MED ORDER — TECHNETIUM TC 99M MEBROFENIN IV KIT
5.4000 | PACK | Freq: Once | INTRAVENOUS | Status: AC | PRN
  Administered 2016-11-25: 5.4 via INTRAVENOUS

## 2016-11-25 MED ORDER — MORPHINE SULFATE (PF) 2 MG/ML IV SOLN
1.0000 mg | INTRAVENOUS | Status: DC | PRN
  Administered 2016-11-25 – 2016-11-26 (×4): 1 mg via INTRAVENOUS
  Filled 2016-11-25 (×4): qty 1

## 2016-11-25 MED ORDER — SINCALIDE 5 MCG IJ SOLR
0.0200 ug/kg | Freq: Once | INTRAMUSCULAR | Status: AC
  Administered 2016-11-25: 1.4 ug via INTRAVENOUS

## 2016-11-25 NOTE — Progress Notes (Signed)
[facility] PROGRESS NOTE  Abigail [name] EXH:[id number] DOB: [birth year] DOA: 11/23/2016   PCP: Philis Fendt, MD     Assessment/Plan: Active Problems:   Multiple myeloma ([place])   Abdominal pain   Intractable nausea and vomiting   LLQ pain   Abigail [name]  is a 71 y.o. female, With past medical history of duodenitis/gastric erosions/gastric ulcer/GERD, anemia, multiple myeloma, hypertension, hyperlipidemia, CAD, depression, patient presents with complaints of nausea vomiting and abdominal pain developed overnight, patient with recent hospitalization for similar complaints with extensive workup including MRI abdomen, CT abdomen pelvis with no findings to explain her nausea and vomiting, EGD was significant for esophagitis, abdominal pain thought to be secondary to muscular skeletal versus constipation, patient Revlimid has been stopped on discharge, patient reports that after discharge she had no complaints, has been trying full liquid diet/soft diet, but report at 1 AM she started to have nausea and vomiting, at least 7 episodes at home   Assessment and plan Intractable nausea and vomiting: Recent admission with similar presentation, extensive workup during her last admission included CT abdomen and pelvis, MRI abdomen, no acute findings to explain her nausea or vomiting or abdominal pain, EGD significant for esophagitis , as well REVLIMID is held as it wasn't clear that was contributing to her symptoms. [facility] was consulted last admission. She underwent EGD (detailed results as below) which essentially showed grade D esophagitis, moderate intrinsic stenosis at GEJ and erythematous due to neuropathy. She was treated supportively and her diet was gradually advanced.  GI recommends twice a day PPI and outpatient follow-up  . she was supposed to follow-up with Dr. Hilarie Fredrickson. Patient discharged on 2/2. Presented with similar symptoms on 2/4. Lipase normal this admission. No diarrhea. In fact no BM  for 48 hours prior to this presentation. UA negative continues to be intermittently nauseous today,no BM since 2/3 , pending HIDA scan to r/o biliary dyskinesia  . Advance diet slowly and see if patient tolerates. Patient slightly better today than yesterday. Continue aggressive constipation regimen  Left lower quadrant abdominal pain/constipation: Etiology of this is not fully clear. She had been having ongoing pain for >3 weeks. CT abdomen and MR unrevealing. Since she had not had a BM for a few days, [facility] ordered bowel purge with MoviPrep during her last admission, patient felt better after having BMs . Restarted back on aggressive constipation regimen yesterday. 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 Her myeloma has done quite well with treatment but she has been off of medications for a couple of weeks due to abdominal pain, nausea and vomiting. She is following with Dr. Learta Codding, Revlimid has been on hold since discharge per oncology recommendation  Bilateral renal cysts: Outpatient follow-up. MR abdomen done to evaluate right renal lesion seen on recent CT and ultrasound examinations. As per report, the 2 lesions in question on the CT scan are both hemorrhagic or proteinaceous cysts. No worrisome contrast enhancement.  Multiple myeloma: Discussion as above. Discussed with her oncologist who will see her in the office next week. Lytic lesions in the spine noted on CT. Patient apparently is on Zometa every 3 months. Oncology recommended holding Revlimid, Velcade, Decadron during her last admission (holding for several weeks now).  Essential hypertension: Reasonably controlled on amlodipine, Imdur.  Hyperlipidemia: Continue statins.  CAD: Asymptomatic of chest pain. Continue aspirin,  statins and beta blockers.  Depression: No suicidal or homicidal ideations. Continue  Prozac.    DVT prophylaxsis heparin  Code Status:  Full code    Family Communication: Discussed in detail with the patient, all imaging results, lab results explained to the patient   Disposition Plan:  1-2 days  Brief narrative: None  Consultants:  None  Procedures:  None  Antibiotics: Anti-infectives    Start     Dose/Rate Route Frequency Ordered Stop   11/23/16 2200  acyclovir (ZOVIRAX) tablet 400 mg     400 mg Oral 2 times daily 11/23/16 1644           HPI/Subjective:  Nausea slightly better today than yesterday  Objective: Vitals:   11/24/16 1439 11/24/16 1524 11/24/16 2122 11/25/16 0509  BP: (!) 137/109 (!) 147/87 124/79 125/78  Pulse: (!) 142 68 74 (!) 54  Resp: '18 17 17 17  ' Temp: 98.5 F (36.9 C)  98.5 F (36.9 C) 98.7 F (37.1 C)  TempSrc: Oral  Oral Oral  SpO2: 98% 99% 98% 99%  Weight:      Height:        Intake/Output Summary (Last 24 hours) at 11/25/16 0850 Last data filed at 11/25/16 0600  Gross per 24 hour  Intake             2350 ml  Output              660 ml  Net             1690 ml    Exam:  Examination:  General exam: Appears calm and comfortable  Respiratory system: Clear to auscultation. Respiratory effort normal. Cardiovascular system: S1 & S2 heard, RRR. No JVD, murmurs, rubs, gallops or clicks. No pedal edema. Gastrointestinal system: Abdomen is nondistended, soft and nontender. No organomegaly or masses felt. Normal bowel sounds heard. Central nervous system: Alert and oriented. No focal neurological deficits. Extremities: Symmetric 5 x 5 power. Skin: No rashes, lesions or ulcers Psychiatry: Judgement and insight appear normal. Mood & affect appropriate.     Data Reviewed: I have personally reviewed following labs and imaging studies  Micro Results No results found for this or any previous visit (from the past 240 hour(s)).  Radiology Reports Dg Abdomen 1 View  Result Date: 10/27/2016 CLINICAL DATA:   71 year old female with a history of constipation and vomiting EXAM: ABDOMEN - 1 VIEW COMPARISON:  CT 07/24/2015, plain film 06/13/2016 FINDINGS: Gas within stomach, small bowel, colon.  No abnormal distention. Calcified density in the left lateral abdomen, of uncertain location. Pelvic phleboliths. Vascular stents of renal arteries again demonstrated. Multilevel degenerative changes of the lumbar spine. Vascular calcifications of the abdominal aorta. No radiopaque foreign body. No displaced fracture IMPRESSION: Nonobstructive bowel gas pattern. Small calcification within the left mid abdomen, favored to be within the fecal stream, although could represent kidney calcification/nephrolithiasis. Aortic atherosclerosis. Vascular stents of the renal arteries. Signed, Dulcy Fanny. Earleen Newport, DO [facility]iology Electronically Signed   By: Corrie Mckusick D.O.   On: 10/27/2016 09:12   Mr Abdomen W Wo Contrast  Result Date: 11/18/2016 CLINICAL DATA:  Evaluate right renal lesion seen on recent CT and ultrasound examinations. EXAM: MRI ABDOMEN WITHOUT AND WITH CONTRAST TECHNIQUE: Multiplanar multisequence MR imaging of the abdomen was performed both before and after the administration of intravenous contrast. CONTRAST:  6m MULTIHANCE GADOBENATE DIMEGLUMINE 529 MG/ML IV SOLN COMPARISON:  CT scan 10/27/2016 and ultrasound 11/10/2016 FINDINGS:  Lower chest: The lung bases are grossly clear. No worrisome pulmonary lesions. No pleural or pericardial effusion. Hepatobiliary: No focal hepatic lesions or intrahepatic biliary dilatation. The gallbladder is normal. No common bile duct dilatation. The portal and hepatic veins are normal. Pancreas:  No mass, inflammation or ductal dilatation. Spleen:  Normal size.  No focal lesions. Adrenals/Urinary Tract:  The adrenal glands are unremarkable. There are multiple small bilateral renal cysts. The lower pole right renal lesion demonstrates  increased T1 signal intensity and no contrast enhancement. This is consistent with a benign hemorrhagic or proteinaceous cyst. The small interpolar lesion involving the left kidney is also a small hemorrhagic or proteinaceous cyst. No worrisome renal lesions. No hydronephrosis. Stomach/Bowel: Visualized portions within the abdomen are unremarkable. Vascular/Lymphatic: Normal caliber aorta. Moderate atherosclerotic calcifications. Calcifications noted at the major branch vessel ostia. Bilateral renal artery stents are noted. No aneurysm or dissection. The major venous structures are patent. No mesenteric or retroperitoneal mass or adenopathy. Other: No ascites or abdominal wall hernia. No subcutaneous lesions. Musculoskeletal: Small scattered hemangiomas are noted. IMPRESSION: 1. Multiple bilateral renal cysts. Most of these are simple cysts. The 2 lesions in question on the CT scan are both hemorrhagic or proteinaceous cysts. No worrisome contrast enhancement. 2. No acute abdominal findings, mass lesions or adenopathy. Electronically Signed   By: Marijo Sanes M.D.   On: 11/18/2016 10:07   Ct Abdomen Pelvis W Contrast  Result Date: 11/19/2016 CLINICAL DATA:  Left lower quadrant pain.  Nausea and vomiting. EXAM: CT ABDOMEN AND PELVIS WITH CONTRAST TECHNIQUE: Multidetector CT imaging of the abdomen and pelvis was performed using the standard protocol following bolus administration of intravenous contrast. CONTRAST:  172m ISOVUE-300 IOPAMIDOL (ISOVUE-300) INJECTION 61% COMPARISON:  Abdominal MRI 11/18/2016. CT abdomen and pelvis 10/27/2016. FINDINGS: Lower chest: Subsegmental atelectasis in the lung bases. At most trace pleural fluid bilaterally. Partially visualize coronary artery atherosclerosis. Hepatobiliary: No focal liver abnormality is seen. No gallstones, gallbladder wall thickening, or biliary dilatation. Pancreas: Unremarkable. Spleen: Unremarkable. Adrenals/Urinary Tract: Unremarkable adrenal glands.  Small bilateral renal lesions more fully characterized on yesterday's MRI. No hydronephrosis. No renal calculi. Unremarkable bladder. Stomach/Bowel: Small sliding hiatal hernia. No evidence of bowel obstruction. Scattered left-sided colonic diverticula without evidence of diverticulitis. Prior appendectomy. Vascular/Lymphatic: Abdominal aortic atherosclerosis without aneurysm. Bilateral renal stents which are grossly patent. No enlarged lymph nodes in the abdomen or pelvis. Reproductive: Status post hysterectomy. No adnexal masses. Other: No intraperitoneal free fluid. No abdominal wall mass or hernia. Musculoskeletal: 1.6 cm lytic lesion in the right aspect of the T12 vertebral body, enlarged from 07/24/2015 an new from 12/31/2011. Unchanged L1 vertebral body hemangioma. Grade 1 retrolisthesis of T12 on L1 and L1 on L2. Multilevel disc degeneration, advanced at T12-L1 and L5-S1. IMPRESSION: 1. No acute abnormality identified in the abdomen or pelvis. 2. 1.6 cm T12 vertebral body lesion, enlarged from 2016. This likely reflects patient's known multiple myeloma, although a metastasis from a non-hematologic malignancy is also possible. 3. Aortic atherosclerosis. Electronically Signed   By: ALogan BoresM.D.   On: 11/19/2016 15:05   Ct Abdomen Pelvis W Contrast  Result Date: 10/27/2016 CLINICAL DATA:  Vomiting since yesterday.  No pain. EXAM: CT ABDOMEN AND PELVIS WITH CONTRAST TECHNIQUE: Multidetector CT imaging of the abdomen and pelvis was performed using the standard protocol following bolus administration of intravenous contrast. CONTRAST:  1055mISOVUE-300 IOPAMIDOL (ISOVUE-300) INJECTION 61% COMPARISON:  07/24/2015 FINDINGS: Lower chest: No acute abnormality. Hepatobiliary: No focal liver abnormality is seen. No  gallstones, gallbladder wall thickening, or biliary dilatation. Pancreas: Unremarkable. No pancreatic ductal dilatation or surrounding inflammatory changes. Spleen: Normal in size without focal  abnormality. Adrenals/Urinary Tract: Adrenal glands are unremarkable. No urolithiasis or obstructive uropathy. 16.8 cm right inferior pole renal mass measuring 52 Hounsfield units which is new compared with 07/24/2015. Multiple hypodense, fluid attenuating left renal mass is most consistent with cysts with the largest measuring 2.3 cm in the mid pole. 8.3 mm indeterminate hypodense left anterior interpolar mass (image 36/series 2). Bladder is unremarkable. Stomach/Bowel: Stomach is within normal limits. No evidence of bowel wall thickening, distention, or inflammatory changes. Vascular/Lymphatic: Abdominal aortic atherosclerosis. A normal caliber abdominal aorta. No lymphadenopathy. Reproductive: Status post hysterectomy. No adnexal masses. Other: No abdominal wall hernia or abnormality. No abdominopelvic ascites. Musculoskeletal: No lytic or sclerotic osseous lesion. No acute osseous abnormality. Mild osteoarthritis bilateral hips. Severe degenerative disc disease with disc height loss at L5-S1. Degenerative disc disease with severe disc height loss at T12-L1 and L1-2. Minimal retrolisthesis of L1 on L2 and T12 on L1. Mild osteoarthritis of bilateral sacroiliac joints. IMPRESSION: 1. No acute abdominal or pelvic pathology. 2. 16.8 cm indeterminate right inferior pole renal mass new compared with 07/24/2015. Similar smaller 8.3 mm indeterminate left anterior interpolar renal mass. Recommend further evaluation with MRI or CT of the abdomen without and with intravenous contrast. Electronically Signed   By: Kathreen Devoid   On: 10/27/2016 14:21   Dg Abdomen Acute W/chest  Result Date: 11/15/2016 CLINICAL DATA:  Initial evaluation for acute left lower quadrant pain for 3 weeks. History of multiple myeloma. EXAM: DG ABDOMEN ACUTE W/ 1V CHEST COMPARISON:  Prior CT from 10/27/2016. FINDINGS: Cardiac and mediastinal silhouettes are within normal limits. Scattered atheromatous plaque noted within the aortic arch. Lungs  normally inflated. Linear opacities of the left lung base most compatible with atelectasis. No other focal airspace disease. No pulmonary edema or pleural effusion. No pneumothorax. Bowel gas pattern within normal limits without evidence for obstruction or ileus. No abnormal bowel wall thickening. Moderate to large amount of retained stool within the right colon. No soft tissue mass or abnormal calcification. Vascular stent overlies the left upper abdomen. No acute osseous abnormality. IMPRESSION: 1. Nonobstructive bowel gas pattern with no radiographic evidence for acute intra-abdominal process. 2. Moderate to large amount of retained stool within the right colon. 3. Mild left basilar atelectasis. No other active cardiopulmonary disease. 4. Aortic atherosclerosis. Electronically Signed   By: Jeannine Boga M.D.   On: 11/15/2016 00:07   Dg Abd Portable 1v  Result Date: 11/23/2016 CLINICAL DATA:  Vomiting.  Right lower quadrant pain. EXAM: PORTABLE ABDOMEN - 1 VIEW COMPARISON:  November 14, 2016 FINDINGS: Vascular stent in the left upper quadrant. No free air, portal venous gas, or pneumatosis. No bowel obstruction. Fecal loading in the colon. Mottled appearance to the bones, likely due to the reported history of multiple myeloma. IMPRESSION: Fecal loading in the colon.  No other acute abnormalities. Electronically Signed   By: Dorise Bullion III M.D   On: 11/23/2016 15:53     CBC  Recent Labs Lab 11/19/16 0406 11/20/16 0419 11/23/16 0930  WBC 3.2* 2.9* 4.8  HGB 10.3* 9.5* 13.2  HCT 32.6* 29.2* 40.0  PLT 124* 113* 212  MCV 94.8 93.6 90.3  MCH 29.9 30.4 29.8  MCHC 31.6 32.5 33.0  RDW 16.3* 16.0* 15.9*    Chemistries   Recent Labs Lab 11/20/16 0419 11/23/16 0930 11/24/16 0424  NA 144 137 142  K 3.1* 4.3 3.7  CL 116* 101 110  CO2 '22 26 24  ' GLUCOSE 100* 139* 92  BUN <5* 7 8  CREATININE 0.84 0.82 0.96  CALCIUM 7.9* 9.8 8.3*  MG 1.7  --   --   AST  --  28 20  ALT  --  12* 11*   ALKPHOS  --  84 63  BILITOT  --  1.1 0.4   ------------------------------------------------------------------------------------------------------------------ estimated creatinine clearance is 54.3 mL/min (by C-G formula based on SCr of 0.96 mg/dL). ------------------------------------------------------------------------------------------------------------------ No results for input(s): HGBA1C in the last 72 hours. ------------------------------------------------------------------------------------------------------------------ No results for input(s): CHOL, HDL, LDLCALC, TRIG, CHOLHDL, LDLDIRECT in the last 72 hours. ------------------------------------------------------------------------------------------------------------------ No results for input(s): TSH, T4TOTAL, T3FREE, THYROIDAB in the last 72 hours.  Invalid input(s): FREET3 ------------------------------------------------------------------------------------------------------------------ No results for input(s): VITAMINB12, FOLATE, FERRITIN, TIBC, IRON, RETICCTPCT in the last 72 hours.  Coagulation profile No results for input(s): INR, PROTIME in the last 168 hours.  No results for input(s): DDIMER in the last 72 hours.  Cardiac Enzymes No results for input(s): CKMB, TROPONINI, MYOGLOBIN in the last 168 hours.  Invalid input(s): CK ------------------------------------------------------------------------------------------------------------------ Invalid input(s): POCBNP   CBG: No results for input(s): GLUCAP in the last 168 hours.     Studies: Dg Abd Portable 1v  Result Date: 11/23/2016 CLINICAL DATA:  Vomiting.  Right lower quadrant pain. EXAM: PORTABLE ABDOMEN - 1 VIEW COMPARISON:  November 14, 2016 FINDINGS: Vascular stent in the left upper quadrant. No free air, portal venous gas, or pneumatosis. No bowel obstruction. Fecal loading in the colon. Mottled appearance to the bones, likely due to the reported history of  multiple myeloma. IMPRESSION: Fecal loading in the colon.  No other acute abnormalities. Electronically Signed   By: Dorise Bullion III M.D   On: 11/23/2016 15:53      Lab Results  Component Value Date   HGBA1C 5.7 (H) 12/31/2011   Lab Results  Component Value Date   LDLCALC 28 11/02/2015   CREATININE 0.96 11/24/2016       Scheduled Meds: . acyclovir  400 mg Oral BID  . amLODipine  10 mg Oral QHS  . feeding supplement  1 Container Oral TID BM  . feeding supplement (ENSURE ENLIVE)  237 mL Oral BID BM  . FLUoxetine  10 mg Oral QHS  . heparin  5,000 Units Subcutaneous Q8H  . isosorbide mononitrate  60 mg Oral Daily  . pantoprazole (PROTONIX) IV  40 mg Intravenous Q12H  . polyethylene glycol  17 g Oral BID  . pravastatin  40 mg Oral QPM   Continuous Infusions: . sodium chloride 50 mL/hr at 11/25/16 0839     LOS: 1 day    Time spent: >30 MINS    [facility] Pager [phone]. If 7PM-7AM, please contact night-coverage at [email or web address], password [facility] 11/25/2016, 8:50 AM  LOS: 1 day

## 2016-11-26 DIAGNOSIS — R112 Nausea with vomiting, unspecified: Secondary | ICD-10-CM

## 2016-11-26 DIAGNOSIS — C9000 Multiple myeloma not having achieved remission: Secondary | ICD-10-CM

## 2016-11-26 DIAGNOSIS — R1032 Left lower quadrant pain: Principal | ICD-10-CM

## 2016-11-26 DIAGNOSIS — G43A1 Cyclical vomiting, intractable: Secondary | ICD-10-CM

## 2016-11-26 LAB — CBC
HEMATOCRIT: 35.2 % — AB (ref 36.0–46.0)
HEMOGLOBIN: 11.2 g/dL — AB (ref 12.0–15.0)
MCH: 28.7 pg (ref 26.0–34.0)
MCHC: 31.8 g/dL (ref 30.0–36.0)
MCV: 90.3 fL (ref 78.0–100.0)
PLATELETS: 199 10*3/uL (ref 150–400)
RBC: 3.9 MIL/uL (ref 3.87–5.11)
RDW: 15.7 % — ABNORMAL HIGH (ref 11.5–15.5)
WBC: 3.6 10*3/uL — AB (ref 4.0–10.5)

## 2016-11-26 LAB — COMPREHENSIVE METABOLIC PANEL
ALK PHOS: 57 U/L (ref 38–126)
ALT: 12 U/L — AB (ref 14–54)
AST: 16 U/L (ref 15–41)
Albumin: 3.5 g/dL (ref 3.5–5.0)
Anion gap: 5 (ref 5–15)
BUN: 8 mg/dL (ref 6–20)
CALCIUM: 8.3 mg/dL — AB (ref 8.9–10.3)
CHLORIDE: 110 mmol/L (ref 101–111)
CO2: 26 mmol/L (ref 22–32)
CREATININE: 0.75 mg/dL (ref 0.44–1.00)
GFR calc Af Amer: >60 mL/min (ref >60)
GFR calc non Af Amer: >60 mL/min (ref >60)
Glucose, Bld: 82 mg/dL (ref 65–99)
Potassium: 3.1 mmol/L — ABNORMAL LOW (ref 3.5–5.1)
Sodium: 141 mmol/L (ref 135–145)
Total Bilirubin: 0.6 mg/dL (ref 0.3–1.2)
Total Protein: 5.7 g/dL — ABNORMAL LOW (ref 6.5–8.1)

## 2016-11-26 MED ORDER — POTASSIUM CHLORIDE CRYS ER 20 MEQ PO TBCR
40.0000 meq | EXTENDED_RELEASE_TABLET | Freq: Once | ORAL | Status: DC
  Filled 2016-11-26: qty 2

## 2016-11-26 MED ORDER — POTASSIUM CHLORIDE CRYS ER 20 MEQ PO TBCR
40.0000 meq | EXTENDED_RELEASE_TABLET | ORAL | Status: AC
  Administered 2016-11-26 (×2): 40 meq via ORAL
  Filled 2016-11-26: qty 2

## 2016-11-26 NOTE — Progress Notes (Signed)
Pt ambulated lap x1 in hallway with no assist. Will continue to encourage OOB

## 2016-11-26 NOTE — Progress Notes (Signed)
PROGRESS NOTE    Abigail [name]  VVO:[id number] DOB: November 07, 1945 DOA: 11/23/2016 PCP: Philis Fendt, MD   Brief Narrative: Abigail [name] is a 71 y.o. female,With past medical history of duodenitis/gastric erosions/gastric ulcer/GERD, anemia, multiple myeloma, hypertension, hyperlipidemia, CAD, depression, patient presents with complaints of nausea vomiting and abdominal pain developed overnight, patient with recent hospitalization for similar complaints with extensive workup including MRI abdomen, CT abdomen pelvis with no findings to explain her nausea and vomiting, EGD was significant for esophagitis, abdominal pain thought to be secondary to muscular skeletal versus constipation, patient Revlimidhas been stopped on discharge, patient reports that after discharge she had no complaints, has been trying full liquid diet/soft diet, but report at 1 AM she started to have nausea and vomiting, at least 7 episodes at home   Assessment & Plan:   Active Problems:   Multiple myeloma (HCC)   Abdominal pain   Intractable nausea and vomiting   LLQ pain   Increased nausea and vomiting  Intractable nausea and vomiting This is a chronic issue. She is follewed by Dr. Hilarie Fredrickson. Workup so far has been negative for precipitating cause. Improving somewhat, but had an episode of emesis today. HIDA scan negative for biliary dyskinesia -continue antiemetics  Left lower quadrant abdominal pain Unclear etiology. Imaging unrevealing.  -continue bowel regimen  Pancytopenia Multiple myeloma Followed by Dr. Benay Spice as outpatient  Bilateral renal cysts Outpatient follow-up. No worrisome contrast enhancement  Essential hypertension Chronic -continue amlodipine and imdur  Hyperlipidemia Chronic -continue statin  CAD Asymptomatic. Chronic. -continue aspirin, statin and beta blocker  Depression -continue Prozac   DVT prophylaxis: Heparin subq Code Status: Full code Family Communication: None at  bedside Disposition Plan: Discharge home in 1-2 days   Consultants:   None  Procedures:  HIDA (11/25/2016)  Antimicrobials:   Acyclovir (2/4>>   Subjective: Some LLQ pain. No nausea or vomiting overnight. One episode this afternoon.  Objective: Vitals:   11/25/16 1637 11/25/16 2133 11/26/16 0528 11/26/16 1451  BP: 124/68 (!) 159/85 129/77 (!) 169/87  Pulse:  75 (!) 50 66  Resp:  17 17   Temp:  98.6 F (37 C) 98.7 F (37.1 C) 99.9 F (37.7 C)  TempSrc:  Oral Oral Oral  SpO2:  100% 99% 99%  Weight:      Height:        Intake/Output Summary (Last 24 hours) at 11/26/16 1620 Last data filed at 11/26/16 1000  Gross per 24 hour  Intake              940 ml  Output                0 ml  Net              940 ml   Filed Weights   11/23/16 1636 11/25/16 1300  Weight: 69.9 kg (154 lb) 70.8 kg (156 lb)    Examination:  General exam: Appears calm and comfortable Respiratory system: Clear to auscultation. Respiratory effort normal. Cardiovascular system: S1 & S2 heard, RRR. No murmurs, rubs, gallops or clicks. Gastrointestinal system: Abdomen is nondistended, soft and tender in LLQ. Normal bowel sounds heard. Central nervous system: Alert and oriented. No focal neurological deficits. Extremities: No edema. No calf tenderness Skin: No cyanosis. No rashes Psychiatry: Judgement and insight appear normal. Mood & affect appropriate.     Data Reviewed: I have personally reviewed following labs and imaging studies  CBC:  Recent Labs Lab 11/20/16 0419 11/23/16 0930  11/26/16 0423  WBC 2.9* 4.8 3.6*  HGB 9.5* 13.2 11.2*  HCT 29.2* 40.0 35.2*  MCV 93.6 90.3 90.3  PLT 113* 212 130   Basic Metabolic Panel:  Recent Labs Lab 11/20/16 0419 11/23/16 0930 11/24/16 0424 11/26/16 0423  NA 144 137 142 141  K 3.1* 4.3 3.7 3.1*  CL 116* 101 110 110  CO2 _0 GLUCOSE 100* 139* 92 82  BUN <5* _1 CREATININE 0.84 0.82 0.96 0.75  CALCIUM 7.9* 9.8 8.3* 8.3*  MG  1.7  --   --   --    GFR: Estimated Creatinine Clearance: 65.5 mL/min (by C-G formula based on SCr of 0.75 mg/dL). Liver Function Tests:  Recent Labs Lab 11/23/16 0930 11/24/16 0424 11/26/16 0423  AST _2 ALT 12* 11* 12*  ALKPHOS 84 63 57  BILITOT 1.1 0.4 0.6  PROT 7.5 6.1* 5.7*  ALBUMIN 4.7 3.6 3.5    Recent Labs Lab 11/23/16 0930  LIPASE 16   No results for input(s): AMMONIA in the last 168 hours. Coagulation Profile: No results for input(s): INR, PROTIME in the last 168 hours. Cardiac Enzymes: No results for input(s): CKTOTAL, CKMB, CKMBINDEX, TROPONINI in the last 168 hours. BNP (last 3 results) No results for input(s): PROBNP in the last 8760 hours. HbA1C: No results for input(s): HGBA1C in the last 72 hours. CBG: No results for input(s): GLUCAP in the last 168 hours. Lipid Profile: No results for input(s): CHOL, HDL, LDLCALC, TRIG, CHOLHDL, LDLDIRECT in the last 72 hours. Thyroid Function Tests: No results for input(s): TSH, T4TOTAL, FREET4, T3FREE, THYROIDAB in the last 72 hours. Anemia Panel: No results for input(s): VITAMINB12, FOLATE, FERRITIN, TIBC, IRON, RETICCTPCT in the last 72 hours. Sepsis Labs: No results for input(s): PROCALCITON, LATICACIDVEN in the last 168 hours.  No results found for this or any previous visit (from the past 240 hour(s)).       Radiology Studies: Nm Hepato W/eject Fract  Result Date: 11/25/2016 CLINICAL DATA:  Abdominal pain, nausea and vomiting over the last 2 days EXAM: NUCLEAR MEDICINE HEPATOBILIARY IMAGING WITH GALLBLADDER EF TECHNIQUE: Sequential images of the abdomen were obtained out to 60 minutes following intravenous administration of radiopharmaceutical. After slow intravenous infusion of 1.4 micrograms Cholecystokinin, gallbladder ejection fraction was determined. RADIOPHARMACEUTICALS:  5.4 mCi Tc-77mCholetec IV COMPARISON:  CT abdomen and pelvis of 11/19/2016 and ultrasound abdomen of 11/10/2016 FINDINGS:  Prompt uptake and biliary excretion of activity by the liver is seen. Gallbladder activity is visualized, consistent with patency of cystic duct. Biliary activity passes into small bowel, consistent with patent common bile duct. Calculated gallbladder ejection fraction is 78%. (At 60 min, normal ejection fraction is greater than 40%.) IMPRESSION: 1. Normal nuclear medicine hepatobiliary scan. 2. Normal gallbladder ejection fraction of 78%. Electronically Signed   By: PIvar DrapeM.D.   On: 11/25/2016 16:12        Scheduled Meds: . acyclovir  400 mg Oral BID  . amLODipine  10 mg Oral QHS  . feeding supplement  1 Container Oral TID BM  . feeding supplement (ENSURE ENLIVE)  237 mL Oral BID BM  . FLUoxetine  10 mg Oral QHS  . heparin  5,000 Units Subcutaneous Q8H  . isosorbide mononitrate  60 mg Oral Daily  . pantoprazole (PROTONIX) IV  40 mg Intravenous Q12H  . polyethylene glycol  17 g Oral BID  . pravastatin  40 mg Oral QPM   Continuous Infusions:  LOS: 2 days     Cordelia Poche [facility] 11/26/2016, 4:20 PM Pager: [phone]  If 7PM-7AM, please contact night-coverage [email or web address] Password [email or web address] 11/26/2016, 4:20 PM

## 2016-11-27 ENCOUNTER — Ambulatory Visit: Payer: Medicare HMO

## 2016-11-27 ENCOUNTER — Telehealth: Payer: Self-pay | Admitting: Oncology

## 2016-11-27 ENCOUNTER — Ambulatory Visit: Payer: Medicare HMO | Admitting: Oncology

## 2016-11-27 ENCOUNTER — Other Ambulatory Visit: Payer: Medicare HMO

## 2016-11-27 MED ORDER — POTASSIUM CHLORIDE CRYS ER 20 MEQ PO TBCR: 40.0000 meq | EXTENDED_RELEASE_TABLET | Freq: Once | ORAL | Status: DC

## 2016-11-27 MED ORDER — POLYETHYLENE GLYCOL 3350 17 G PO PACK
17.0000 g | PACK | Freq: Every day | ORAL | 0 refills | Status: DC
Start: 2016-11-27 — End: 2016-12-09

## 2016-11-27 MED ORDER — ONDANSETRON HCL 4 MG PO TABS: 4.0000 mg | ORAL_TABLET | Freq: Three times a day (TID) | ORAL | 0 refills | Status: DC | PRN

## 2016-11-27 MED ORDER — BOOST / RESOURCE BREEZE PO LIQD: 1.0000 | Freq: Three times a day (TID) | ORAL | 30 refills | Status: DC

## 2016-11-27 MED ORDER — ENSURE ENLIVE PO LIQD
237.0000 mL | Freq: Two times a day (BID) | ORAL | 30 refills | Status: DC
Start: 2016-11-27 — End: 2017-06-23

## 2016-11-27 NOTE — Telephone Encounter (Signed)
Patient called to cancel her appointments today she is in the hospital at [facility]

## 2016-11-27 NOTE — Progress Notes (Signed)
Patient d/c home. Stable. 

## 2016-11-27 NOTE — Discharge Instructions (Signed)
Abigail [name],  You were admitted because of your nausea, vomiting and abdominal pain. This has improved. Please follow-up with Dr. Hilarie Fredrickson. Please use your Zofran 30 minutes before you eat. Please return if you have nausea with vomiting that does not improved with your zofran after a few doses and it is preventing you from keeping down fluids.

## 2016-11-27 NOTE — Discharge Summary (Signed)
Physician Discharge Summary  [name] CXK:[id number] DOB: 1946/01/26 DOA: 11/23/2016  PCP: Philis Fendt, MD  Admit date: 11/23/2016 Discharge date: 11/27/2016  Admitted From: Home Disposition:  Home  Recommendations for Outpatient Follow-up:  1. Follow up with PCP in 1 week 2. Follow-up with GI in 1 week 3. Continue antiemetics 4. Outpatient follow-up for renal cysts   Discharge Condition: Stable CODE STATUS: Full code   Brief/Interim Summary:  HPI written by Phillips Climes, MD on 11/23/2016   Abigail [name]  is a 71 y.o. female, With past medical history of duodenitis/gastric erosions/gastric ulcer/GERD, anemia, multiple myeloma, hypertension, hyperlipidemia, CAD, depression, patient presents with complaints of nausea vomiting and abdominal pain developed overnight, patient with recent hospitalization for similar complaints with extensive workup including MRI abdomen, CT abdomen pelvis with no findings to explain her nausea and vomiting, EGD was significant for esophagitis, abdominal pain thought to be secondary to muscular skeletal versus constipation, patient Revlimid has been stopped on discharge, patient reports that after discharge she had no complaints, has been trying full liquid diet/soft diet, but report at 1 AM she started to have nausea and vomiting, at least 7 episodes at home, as well she had another episodes in ED despite receiving Zofran 2, denies coffee-ground emesis, fever, chills, cough, diarrhea or constipation, dysuria or polyuria, complaints of abdominal pain, constant over last 4 weeks, significant on palpation, no increased or change in intensity or quality. No significant lab abnormalities, I was called to admit given significant nausea and vomiting despite few doses of Zofran.   Hospital course:  Intractable nausea and vomiting This is a chronic issue. She is follewed by Dr. Hilarie Fredrickson. Workup so far has been negative for precipitating cause. Improved with taking Zofran  prior to meals. HIDA scan negative for biliary dyskinesia. Continue antiemetics at discharge  Left lower quadrant abdominal pain Unclear etiology. Imaging unrevealing. Had multiple bowel movements. Decreased Miralax to daily at discharge. Follow-up with GI  Pancytopenia Multiple myeloma Followed by Dr. Benay Spice as outpatient  Bilateral renal cysts Outpatient follow-up. No worrisome contrast enhancement  Essential hypertension Chronic. Continued amlodipine and imdur  Hyperlipidemia Chronic. Continued statin  CAD Asymptomatic. Chronic. Continued aspirin, statin and beta blocker  Depression Continued Prozac   Discharge Diagnoses:  Active Problems:   Multiple myeloma (HCC)   Abdominal pain   Intractable nausea and vomiting   LLQ pain   Increased nausea and vomiting    Discharge Instructions   Allergies as of 11/27/2016      Reactions   Sulfa Antibiotics Rash      Medication List    STOP taking these medications   HYDROcodone-acetaminophen 5-325 MG tablet Commonly known as:  NORCO/VICODIN     TAKE these medications   acyclovir 400 MG tablet Commonly known as:  ZOVIRAX TAKE 1 TABLET (400 MG TOTAL) BY MOUTH 2 (TWO) TIMES DAILY.   amLODipine 10 MG tablet Commonly known as:  NORVASC Take 10 mg by mouth at bedtime.   aspirin EC 81 MG tablet Take 81 mg by mouth daily.   calcium carbonate 1250 (500 Ca) MG tablet Commonly known as:  OS-CAL - dosed in mg of elemental calcium Take 1 tablet (500 mg of elemental calcium total) by mouth daily with breakfast.   dexamethasone 4 MG tablet Commonly known as:  DECADRON TAKE 40 MG (10 TABLETS) ONCE A WEEK ON DAY OF VELCADE INJECTION   docusate sodium 100 MG capsule Commonly known as:  COLACE Take 1 capsule (100 mg total)  by mouth 2 (two) times daily.   feeding supplement Liqd Take 1 Container by mouth 3 (three) times daily between meals.   feeding supplement (ENSURE ENLIVE) Liqd Take 237 mLs by mouth 2 (two)  times daily between meals.   FLUoxetine 10 MG capsule Commonly known as:  PROZAC Take 10 mg by mouth at bedtime.   isosorbide mononitrate 60 MG 24 hr tablet Commonly known as:  IMDUR Take 1 tablet (60 mg total) by mouth daily.   KLOR-CON M10 10 MEQ tablet Generic drug:  potassium chloride Take 10 mEq by mouth daily.   ondansetron 4 MG tablet Commonly known as:  ZOFRAN Take 1 tablet (4 mg total) by mouth every 8 (eight) hours as needed for nausea or vomiting.   oxyCODONE-acetaminophen 5-325 MG tablet Commonly known as:  PERCOCET/ROXICET   pantoprazole 40 MG tablet Commonly known as:  PROTONIX Take 1 tablet (40 mg total) by mouth 2 (two) times daily before a meal.   polyethylene glycol packet Commonly known as:  MIRALAX / GLYCOLAX Take 17 g by mouth daily. What changed:  when to take this   pravastatin 40 MG tablet Commonly known as:  PRAVACHOL Take 40 mg by mouth every evening.   prochlorperazine 5 MG tablet Commonly known as:  COMPAZINE Take 1 tablet (5 mg total) by mouth every 6 (six) hours as needed for nausea or vomiting.   REVLIMID 15 MG capsule Generic drug:  lenalidomide   vitamin C 1000 MG tablet Take 1,000 mg by mouth daily.   VITAMIN D PO Take 1 capsule by mouth daily.      Follow-up Information    Philis Fendt, MD. Schedule an appointment as soon as possible for a visit in 1 week(s).   Specialty:  Internal Medicine Contact information: [place] [address] [phone]        Jerene Bears, MD. Schedule an appointment as soon as possible for a visit in 1 week(s).   Specialty:  Gastroenterology Contact information: [address] [address] [phone]          Allergies  Allergen Reactions  . Sulfa Antibiotics Rash    Consultations:  None   Procedures/Studies: Mr Abdomen W Wo Contrast  Result Date: 11/18/2016 CLINICAL DATA:  Evaluate right renal lesion seen on recent CT and ultrasound  examinations. EXAM: MRI ABDOMEN WITHOUT AND WITH CONTRAST TECHNIQUE: Multiplanar multisequence MR imaging of the abdomen was performed both before and after the administration of intravenous contrast. CONTRAST:  48m MULTIHANCE GADOBENATE DIMEGLUMINE 529 MG/ML IV SOLN COMPARISON:  CT scan 10/27/2016 and ultrasound 11/10/2016 FINDINGS: Lower chest: The lung bases are grossly clear. No worrisome pulmonary lesions. No pleural or pericardial effusion. Hepatobiliary: No focal hepatic lesions or intrahepatic biliary dilatation. The gallbladder is normal. No common bile duct dilatation. The portal and hepatic veins are normal. Pancreas:  No mass, inflammation or ductal dilatation. Spleen:  Normal size.  No focal lesions. Adrenals/Urinary Tract:  The adrenal glands are unremarkable. There are multiple small bilateral renal cysts. The lower pole right renal lesion demonstrates increased T1 signal intensity and no contrast enhancement. This is consistent with a benign hemorrhagic or proteinaceous cyst. The small interpolar lesion involving the left kidney is also a small hemorrhagic or proteinaceous cyst. No worrisome renal lesions. No hydronephrosis. Stomach/Bowel: Visualized portions within the abdomen are unremarkable. Vascular/Lymphatic: Normal caliber aorta. Moderate atherosclerotic calcifications. Calcifications noted at the major branch vessel ostia. Bilateral renal artery stents are noted. No aneurysm  or dissection. The major venous structures are patent. No mesenteric or retroperitoneal mass or adenopathy. Other: No ascites or abdominal wall hernia. No subcutaneous lesions. Musculoskeletal: Small scattered hemangiomas are noted. IMPRESSION: 1. Multiple bilateral renal cysts. Most of these are simple cysts. The 2 lesions in question on the CT scan are both hemorrhagic or proteinaceous cysts. No worrisome contrast enhancement. 2. No acute abdominal findings, mass lesions or adenopathy. Electronically Signed   By: Marijo Sanes M.D.   On: 11/18/2016 10:07   Ct Abdomen Pelvis W Contrast  Result Date: 11/19/2016 CLINICAL DATA:  Left lower quadrant pain.  Nausea and vomiting. EXAM: CT ABDOMEN AND PELVIS WITH CONTRAST TECHNIQUE: Multidetector CT imaging of the abdomen and pelvis was performed using the standard protocol following bolus administration of intravenous contrast. CONTRAST:  137m ISOVUE-300 IOPAMIDOL (ISOVUE-300) INJECTION 61% COMPARISON:  Abdominal MRI 11/18/2016. CT abdomen and pelvis 10/27/2016. FINDINGS: Lower chest: Subsegmental atelectasis in the lung bases. At most trace pleural fluid bilaterally. Partially visualize coronary artery atherosclerosis. Hepatobiliary: No focal liver abnormality is seen. No gallstones, gallbladder wall thickening, or biliary dilatation. Pancreas: Unremarkable. Spleen: Unremarkable. Adrenals/Urinary Tract: Unremarkable adrenal glands. Small bilateral renal lesions more fully characterized on yesterday's MRI. No hydronephrosis. No renal calculi. Unremarkable bladder. Stomach/Bowel: Small sliding hiatal hernia. No evidence of bowel obstruction. Scattered left-sided colonic diverticula without evidence of diverticulitis. Prior appendectomy. Vascular/Lymphatic: Abdominal aortic atherosclerosis without aneurysm. Bilateral renal stents which are grossly patent. No enlarged lymph nodes in the abdomen or pelvis. Reproductive: Status post hysterectomy. No adnexal masses. Other: No intraperitoneal free fluid. No abdominal wall mass or hernia. Musculoskeletal: 1.6 cm lytic lesion in the right aspect of the T12 vertebral body, enlarged from 07/24/2015 an new from 12/31/2011. Unchanged L1 vertebral body hemangioma. Grade 1 retrolisthesis of T12 on L1 and L1 on L2. Multilevel disc degeneration, advanced at T12-L1 and L5-S1. IMPRESSION: 1. No acute abnormality identified in the abdomen or pelvis. 2. 1.6 cm T12 vertebral body lesion, enlarged from 2016. This likely reflects patient's known multiple  myeloma, although a metastasis from a non-hematologic malignancy is also possible. 3. Aortic atherosclerosis. Electronically Signed   By: ALogan BoresM.D.   On: 11/19/2016 15:05   Nm Hepato W/eject Fract  Result Date: 11/25/2016 CLINICAL DATA:  Abdominal pain, nausea and vomiting over the last 2 days EXAM: NUCLEAR MEDICINE HEPATOBILIARY IMAGING WITH GALLBLADDER EF TECHNIQUE: Sequential images of the abdomen were obtained out to 60 minutes following intravenous administration of radiopharmaceutical. After slow intravenous infusion of 1.4 micrograms Cholecystokinin, gallbladder ejection fraction was determined. RADIOPHARMACEUTICALS:  5.4 mCi Tc-939mholetec IV COMPARISON:  CT abdomen and pelvis of 11/19/2016 and ultrasound abdomen of 11/10/2016 FINDINGS: Prompt uptake and biliary excretion of activity by the liver is seen. Gallbladder activity is visualized, consistent with patency of cystic duct. Biliary activity passes into small bowel, consistent with patent common bile duct. Calculated gallbladder ejection fraction is 78%. (At 60 min, normal ejection fraction is greater than 40%.) IMPRESSION: 1. Normal nuclear medicine hepatobiliary scan. 2. Normal gallbladder ejection fraction of 78%. Electronically Signed   By: PaIvar Drape.D.   On: 11/25/2016 16:12   Dg Abdomen Acute W/chest  Result Date: 11/15/2016 CLINICAL DATA:  Initial evaluation for acute left lower quadrant pain for 3 weeks. History of multiple myeloma. EXAM: DG ABDOMEN ACUTE W/ 1V CHEST COMPARISON:  Prior CT from 10/27/2016. FINDINGS: Cardiac and mediastinal silhouettes are within normal limits. Scattered atheromatous plaque noted within the aortic arch. Lungs normally inflated. Linear opacities of  the left lung base most compatible with atelectasis. No other focal airspace disease. No pulmonary edema or pleural effusion. No pneumothorax. Bowel gas pattern within normal limits without evidence for obstruction or ileus. No abnormal bowel wall  thickening. Moderate to large amount of retained stool within the right colon. No soft tissue mass or abnormal calcification. Vascular stent overlies the left upper abdomen. No acute osseous abnormality. IMPRESSION: 1. Nonobstructive bowel gas pattern with no radiographic evidence for acute intra-abdominal process. 2. Moderate to large amount of retained stool within the right colon. 3. Mild left basilar atelectasis. No other active cardiopulmonary disease. 4. Aortic atherosclerosis. Electronically Signed   By: Jeannine Boga M.D.   On: 11/15/2016 00:07   Dg Abd Portable 1v  Result Date: 11/23/2016 CLINICAL DATA:  Vomiting.  Right lower quadrant pain. EXAM: PORTABLE ABDOMEN - 1 VIEW COMPARISON:  November 14, 2016 FINDINGS: Vascular stent in the left upper quadrant. No free air, portal venous gas, or pneumatosis. No bowel obstruction. Fecal loading in the colon. Mottled appearance to the bones, likely due to the reported history of multiple myeloma. IMPRESSION: Fecal loading in the colon.  No other acute abnormalities. Electronically Signed   By: Dorise Bullion III M.D   On: 11/23/2016 15:53     Subjective: Patient reports minimal abdominal pain (about a 2-3/10) with improvement in nausea symptoms. No emesis overnight  Discharge Exam: Vitals:   11/26/16 2019 11/27/16 0426  BP: (!) 166/89 125/73  Pulse: 74 63  Resp: 18 12  Temp: 100.3 F (37.9 C) 99 F (37.2 C)   Vitals:   11/26/16 0528 11/26/16 1451 11/26/16 2019 11/27/16 0426  BP: 129/77 (!) 169/87 (!) 166/89 125/73  Pulse: (!) 50 66 74 63  Resp: _0 Temp: 98.7 F (37.1 C) 99.9 F (37.7 C) 100.3 F (37.9 C) 99 F (37.2 C)  TempSrc: Oral Oral Oral Oral  SpO2: 99% 99% 98% 100%  Weight:      Height:        General exam: Appears calm and comfortable Respiratory system: Clear to auscultation. Respiratory effort normal. Cardiovascular system: S1 & S2 heard, RRR. No murmurs, rubs, gallops or clicks. Gastrointestinal  system: Abdomen is nondistended, soft and nontender in LLQ. Normal bowel sounds heard. Central nervous system: Alert and oriented. No focal neurological deficits. Extremities: No edema. No calf tenderness Skin: No cyanosis. No rashes Psychiatry: Judgement and insight appear normal. Mood & affect appropriate.    The results of significant diagnostics from this hospitalization (including imaging, microbiology, ancillary and laboratory) are listed below for reference.     Microbiology: No results found for this or any previous visit (from the past 240 hour(s)).   Labs: BNP (last 3 results)  Recent Labs  06/12/16 2204  BNP 280.0*   Basic Metabolic Panel:  Recent Labs Lab 11/23/16 0930 11/24/16 0424 11/26/16 0423  NA 137 142 141  K 4.3 3.7 3.1*  CL 101 110 110  CO2 _1 GLUCOSE 139* 92 82  BUN _2 CREATININE 0.82 0.96 0.75  CALCIUM 9.8 8.3* 8.3*   Liver Function Tests:  Recent Labs Lab 11/23/16 0930 11/24/16 0424 11/26/16 0423  AST _3 ALT 12* 11* 12*  ALKPHOS 84 63 57  BILITOT 1.1 0.4 0.6  PROT 7.5 6.1* 5.7*  ALBUMIN 4.7 3.6 3.5    Recent Labs Lab 11/23/16 0930  LIPASE 16   No results for input(s): AMMONIA in the last 168  hours. CBC:  Recent Labs Lab 11/23/16 0930 11/26/16 0423  WBC 4.8 3.6*  HGB 13.2 11.2*  HCT 40.0 35.2*  MCV 90.3 90.3  PLT 212 199   Cardiac Enzymes: No results for input(s): CKTOTAL, CKMB, CKMBINDEX, TROPONINI in the last 168 hours. BNP: Invalid input(s): POCBNP CBG: No results for input(s): GLUCAP in the last 168 hours. D-Dimer No results for input(s): DDIMER in the last 72 hours. Hgb A1c No results for input(s): HGBA1C in the last 72 hours. Lipid Profile No results for input(s): CHOL, HDL, LDLCALC, TRIG, CHOLHDL, LDLDIRECT in the last 72 hours. Thyroid function studies No results for input(s): TSH, T4TOTAL, T3FREE, THYROIDAB in the last 72 hours.  Invalid input(s): FREET3 Anemia work up No results  for input(s): VITAMINB12, FOLATE, FERRITIN, TIBC, IRON, RETICCTPCT in the last 72 hours. Urinalysis    Component Value Date/Time   COLORURINE YELLOW 11/23/2016 0954   APPEARANCEUR CLEAR 11/23/2016 0954   LABSPEC 1.009 11/23/2016 0954   LABSPEC 1.010 10/30/2016 1522   PHURINE 8.0 11/23/2016 0954   GLUCOSEU NEGATIVE 11/23/2016 0954   GLUCOSEU Negative 10/30/2016 1522   HGBUR NEGATIVE 11/23/2016 0954   BILIRUBINUR NEGATIVE 11/23/2016 0954   BILIRUBINUR Negative 10/30/2016 1522   KETONESUR 5 (A) 11/23/2016 0954   PROTEINUR 100 (A) 11/23/2016 0954   UROBILINOGEN 0.2 10/30/2016 1522   NITRITE NEGATIVE 11/23/2016 0954   LEUKOCYTESUR NEGATIVE 11/23/2016 0954   LEUKOCYTESUR Trace 10/30/2016 1522   Sepsis Labs Invalid input(s): PROCALCITONIN,  WBC,  LACTICIDVEN Microbiology No results found for this or any previous visit (from the past 240 hour(s)).   Time coordinating discharge: Over 30 minutes  SIGNED:   Cordelia Poche, MD [facility] 11/27/2016, 5:56 PM Pager [phone]  If 7PM-7AM, please contact night-coverage [email or web address] Password [email or web address]

## 2016-11-27 NOTE — Progress Notes (Signed)
Patient's d/c instructions rendered to patient,verbalized understanding, appointments, changes in medications also discussed. Tolerated her diet, no n/v. Patient is stable.

## 2016-11-28 ENCOUNTER — Telehealth: Payer: Self-pay | Admitting: Nurse Practitioner

## 2016-11-28 NOTE — Telephone Encounter (Signed)
Unable to reach pt or lvm on number listed. Called pt sister and lvm to inform  of 2/14 appt date/time per LOS

## 2016-11-30 ENCOUNTER — Other Ambulatory Visit: Payer: Self-pay | Admitting: Oncology

## 2016-12-02 ENCOUNTER — Other Ambulatory Visit: Payer: Self-pay | Admitting: Oncology

## 2016-12-03 ENCOUNTER — Ambulatory Visit: Payer: Medicare HMO

## 2016-12-03 ENCOUNTER — Other Ambulatory Visit (HOSPITAL_BASED_OUTPATIENT_CLINIC_OR_DEPARTMENT_OTHER): Payer: Medicare HMO

## 2016-12-03 ENCOUNTER — Ambulatory Visit (HOSPITAL_BASED_OUTPATIENT_CLINIC_OR_DEPARTMENT_OTHER): Payer: Medicare HMO | Admitting: Nurse Practitioner

## 2016-12-03 VITALS — BP 129/75 | HR 60 | Temp 98.6°F | Resp 16 | Ht 65.0 in | Wt 146.9 lb

## 2016-12-03 DIAGNOSIS — R339 Retention of urine, unspecified: Secondary | ICD-10-CM

## 2016-12-03 DIAGNOSIS — D630 Anemia in neoplastic disease: Secondary | ICD-10-CM

## 2016-12-03 DIAGNOSIS — C9000 Multiple myeloma not having achieved remission: Secondary | ICD-10-CM

## 2016-12-03 LAB — CBC WITH DIFFERENTIAL/PLATELET
BASO%: 0.2 % (ref 0.0–2.0)
BASOS ABS: 0 10*3/uL (ref 0.0–0.1)
EOS ABS: 0 10*3/uL (ref 0.0–0.5)
EOS%: 0.2 % (ref 0.0–7.0)
HCT: 42.2 % (ref 34.8–46.6)
HGB: 14.3 g/dL (ref 11.6–15.9)
LYMPH%: 11.4 % — AB (ref 14.0–49.7)
MCH: 31.3 pg (ref 25.1–34.0)
MCHC: 33.9 g/dL (ref 31.5–36.0)
MCV: 92.3 fL (ref 79.5–101.0)
MONO#: 0.1 10*3/uL (ref 0.1–0.9)
MONO%: 2.7 % (ref 0.0–14.0)
NEUT#: 3.5 10*3/uL (ref 1.5–6.5)
NEUT%: 85.5 % — AB (ref 38.4–76.8)
Platelets: 376 10*3/uL (ref 145–400)
RBC: 4.57 10*6/uL (ref 3.70–5.45)
RDW: 16.2 % — AB (ref 11.2–14.5)
WBC: 4.1 10*3/uL (ref 3.9–10.3)
lymph#: 0.5 10*3/uL — ABNORMAL LOW (ref 0.9–3.3)

## 2016-12-03 LAB — COMPREHENSIVE METABOLIC PANEL
ALBUMIN: 4.4 g/dL (ref 3.5–5.0)
ALT: 16 U/L (ref 0–55)
ANION GAP: 14 meq/L — AB (ref 3–11)
AST: 17 U/L (ref 5–34)
Alkaline Phosphatase: 78 U/L (ref 40–150)
BILIRUBIN TOTAL: 0.66 mg/dL (ref 0.20–1.20)
BUN: 13.6 mg/dL (ref 7.0–26.0)
CALCIUM: 9.9 mg/dL (ref 8.4–10.4)
CO2: 21 meq/L — AB (ref 22–29)
CREATININE: 1.2 mg/dL — AB (ref 0.6–1.1)
Chloride: 103 mEq/L (ref 98–109)
EGFR: 52 mL/min/{1.73_m2} — ABNORMAL LOW (ref >90)
Glucose: 123 mg/dl (ref 70–140)
Potassium: 3.9 mEq/L (ref 3.5–5.1)
Sodium: 139 mEq/L (ref 136–145)
TOTAL PROTEIN: 7.2 g/dL (ref 6.4–8.3)

## 2016-12-03 MED ORDER — TRAMADOL HCL 50 MG PO TABS: 25.0000 mg | ORAL_TABLET | Freq: Three times a day (TID) | ORAL | 0 refills | Status: DC | PRN

## 2016-12-03 NOTE — Progress Notes (Signed)
  [facility] OFFICE PROGRESS NOTE   Diagnosis:  Multiple myeloma  INTERVAL HISTORY:   Abigail [name] returns for follow-up. Treatment for the myeloma has been on hold since January due to multiple complaints including urinary retention, abdominal pain and nausea/vomiting. She was most recently hospitalized 11/23/2016 through 11/27/2016 with nausea, vomiting and abdominal pain. Workup thus far has been indeterminant as to the etiology.  The abdominal pain is better. She continues to have mild nausea. No vomiting. No further problems with urinary retention. Appetite is poor. She continues to lose weight. She feels weak.  Objective:  Vital signs in last 24 hours:  Blood pressure 129/75, pulse 60, temperature 98.6 F (37 C), temperature source Oral, resp. rate 16, height '5\' 5"'$  (1.651 m), weight 146 lb 14.4 oz (66.6 kg), SpO2 99 %.    HEENT: White coating over tongue. No buccal thrush. Resp: Lungs clear bilaterally. Cardio: Regular rate and rhythm. GI: Abdomen is soft and nontender. No hepatomegaly. Vascular: No leg edema.   Lab Results:  Lab Results  Component Value Date   WBC 4.1 10/30/202018   HGB 14.3 10/30/202018   HCT 42.2 10/30/202018   MCV 92.3 10/30/202018   PLT 376 10/30/202018   NEUTROABS 3.5 10/30/202018    Imaging:  No results found.  Medications: I have reviewed the patient's current medications.  Assessment/Plan: 1. Multiple myeloma, IgG lambda, bone marrow biopsy 06/15/2016 confirmed multiple myeloma; cytogenetics by FISHshow +11, +12, 13q-  Elevated serum free lambda light chains  Lambda light chain proteinuria  Lytic bone lesions on a bone survey 06/13/2016  Initiation of weekly Velcade/Decadron 06/19/2016  Serum light chains improved 07/31/2016  Initiation of Revlimid 11/03/20172 weeks on/2 weeks off  Serum M spike and IgG significantly improved 09/25/2016  Velcade held on 10/30/2016 due to urinary retention  2. Severe anemia secondary to  #1-markedly improved  3. Diffuse lytic bone lesions secondary to multiple myeloma, status post Zometa 09/18/2016(plan to continue every 3 months)  4. History of coronary artery disease/myocardial infarction  5. History of colon polyps  6. Bilateral leg edema 09/04/2016. Negative bilateral venous Doppler 09/05/2016.  7. Mild periorbital edema 09/18/2016, question related to early stye formation, question Velcade related chalazia.Improved.  8. CT scan 10/27/2016 with a new right kidney evaluated by urology  9. Urinary retention 10/28/2016 status post evaluation in the emergency department. Urinalysis negative for signs of infection 10/30/2016. Velcade held. Resolved.  10. Abdominal pain, nausea, vomiting. Etiology unclear. Upper endoscopy 11/18/2016 with findings of reflux esophagitis, benign appearing esophageal stenosis, small hiatal hernia and erythematous duodenopathy. CT abdomen/pelvis 11/19/2016 with no acute abnormality.   Disposition: Abigail [name] is a 71 year old woman with multiple myeloma. Treatment for the myeloma has been on hold secondary to initial urinary retention, abdominal pain and nausea/vomiting. The urinary retention has resolved. The abdominal pain, nausea/vomiting are better but not resolved. She continues to lose weight. We decided to continue to hold treatment for the myeloma. We will follow-up on the myeloma labs from today. She has a follow-up appointment with Dr. Hilarie [name] tomorrow. She will return for a follow-up visit here in one week.  She requested a prescription for pain medication should the abdominal pain increase. She was given a prescription for tramadol 25-50 mg every 8 hours as needed.  Plan reviewed with Dr. Benay Spice.    [name] ANP/GNP-BC   02-22-2017  12:58 PM

## 2016-12-04 ENCOUNTER — Encounter: Payer: Self-pay | Admitting: Gastroenterology

## 2016-12-04 ENCOUNTER — Ambulatory Visit (INDEPENDENT_AMBULATORY_CARE_PROVIDER_SITE_OTHER): Payer: Medicare HMO | Admitting: Gastroenterology

## 2016-12-04 ENCOUNTER — Telehealth: Payer: Self-pay | Admitting: *Deleted

## 2016-12-04 VITALS — BP 104/70 | HR 144 | Ht 62.5 in | Wt 146.4 lb

## 2016-12-04 DIAGNOSIS — K2210 Ulcer of esophagus without bleeding: Secondary | ICD-10-CM

## 2016-12-04 DIAGNOSIS — R112 Nausea with vomiting, unspecified: Secondary | ICD-10-CM

## 2016-12-04 LAB — IGG: IgG, Qn, Serum: 546 mg/dL — ABNORMAL LOW (ref 700–1600)

## 2016-12-04 NOTE — Patient Instructions (Signed)
Continue twice a day pantoprazole  Continue twice a day miralax

## 2016-12-04 NOTE — Telephone Encounter (Signed)
1448: Message from [place] at [facility]: Pt stated Dr. Benay Spice and Lattie Haw wanted to speak with PA. Pt was seen today. Alonza Bogus can be reached at [phone].

## 2016-12-04 NOTE — Progress Notes (Addendum)
12/04/2016 Abigail [name] [id number] 07-06-1946   HISTORY OF PRESENT ILLNESS:  71 year old female with multiple myeloma with complaints of nausea, vomiting, and abdominal pain.  Recently hospitalized for these complaints.  She has undergone extensive evaluation for these symptoms but no specific cause identified. EGD 10/2016 showed grade C esophagitis, which very well could've been the result of her vomiting. She is feeling better at this point, but still has some nausea intermittently. Our recommendations were to continue twice a day PPI indefinitely and to continue moving her bowels well with MiraLAX.  Currently complaining of poor appetite and weight loss.  She is drinking 2-3 Ensure per day.   Past Medical History:  Diagnosis Date  . Anemia 07/2015  . Bilateral renal artery stenosis ([place]) 1999   s/p stenting  . Coronary artery disease 1999  . Depression   . Diverticulosis   . Duodenitis   . Gastric erosions   . Gastric ulcer   . GERD (gastroesophageal reflux disease)   . Heart murmur   . History of blood transfusion ~ 03/2016   "low HgB; practically nonexistent"  . Hyperlipidemia   . Hypertension   . MI (myocardial infarction) 1999  . Multiple myeloma ([place])   . Retinal hemorrhage, right eye   . Schatzki's ring   . Tubular adenoma of colon    Past Surgical History:  Procedure Laterality Date  . APPENDECTOMY    . BACK SURGERY    . CATARACT EXTRACTION W/ INTRAOCULAR LENS IMPLANT Right 2014  . CORONARY ANGIOPLASTY WITH STENT PLACEMENT  1999  . ESOPHAGOGASTRODUODENOSCOPY N/A 11/03/2015   Procedure: ESOPHAGOGASTRODUODENOSCOPY (EGD);  Surgeon: Carol Ada, MD;  Location[facility] ENDOSCOPY;  Service: Endoscopy;  Laterality: N/A;  . ESOPHAGOGASTRODUODENOSCOPY (EGD) WITH PROPOFOL N/A 11/18/2016   Procedure: ESOPHAGOGASTRODUODENOSCOPY (EGD) WITH PROPOFOL;  Surgeon: Ladene Artist, MD;  Location: [facility] ENDOSCOPY;  Service: Endoscopy;  Laterality: N/A;  . LUMBAR [place]    . RENAL ARTERY  STENT  1999   bil RAS so ? bil vs unilateral stents  . RETINAL LASER PROCEDURE Right 2012   "bleeding"  . TONSILLECTOMY    . TOTAL ABDOMINAL HYSTERECTOMY      reports that she has been smoking Cigarettes.  She has a 24.00 pack-year smoking history. She has never used smokeless tobacco. She reports that she does not drink alcohol or use drugs. family history is not on file. Allergies  Allergen Reactions  . Sulfa Antibiotics Rash      Outpatient Encounter Prescriptions as of 12/04/2016  Medication Sig  . acyclovir (ZOVIRAX) 400 MG tablet TAKE 1 TABLET (400 MG TOTAL) BY MOUTH 2 (TWO) TIMES DAILY.  Marland Kitchen amLODipine (NORVASC) 10 MG tablet Take 10 mg by mouth at bedtime.   . Ascorbic Acid (VITAMIN C) 1000 MG tablet Take 1,000 mg by mouth daily.   Marland Kitchen aspirin EC 81 MG tablet Take 81 mg by mouth daily.  . calcium carbonate (OS-CAL - DOSED IN MG OF ELEMENTAL CALCIUM) 1250 (500 Ca) MG tablet Take 1 tablet (500 mg of elemental calcium total) by mouth daily with breakfast.  . Cholecalciferol (VITAMIN D PO) Take 1 capsule by mouth daily.  Marland Kitchen dexamethasone (DECADRON) 4 MG tablet TAKE 40 MG (10 TABLETS) ONCE A WEEK ON DAY OF VELCADE INJECTION  . docusate sodium (COLACE) 100 MG capsule Take 1 capsule (100 mg total) by mouth 2 (two) times daily.  . feeding supplement (BOOST / RESOURCE BREEZE) LIQD Take 1 Container by mouth 3 (  three) times daily between meals.  . feeding supplement, ENSURE ENLIVE, (ENSURE ENLIVE) LIQD Take 237 mLs by mouth 2 (two) times daily between meals.  . FLUoxetine (PROZAC) 10 MG capsule Take 10 mg by mouth at bedtime.   . isosorbide mononitrate (IMDUR) 60 MG 24 hr tablet Take 1 tablet (60 mg total) by mouth daily.  . KLOR-CON M10 10 MEQ tablet Take 10 mEq by mouth daily.   . ondansetron (ZOFRAN) 4 MG tablet Take 1 tablet (4 mg total) by mouth every 8 (eight) hours as needed for nausea or vomiting.  . oxyCODONE-acetaminophen (PERCOCET/ROXICET) 5-325 MG tablet   . pantoprazole (PROTONIX)  40 MG tablet Take 1 tablet (40 mg total) by mouth 2 (two) times daily before a meal.  . polyethylene glycol (MIRALAX / GLYCOLAX) packet Take 17 g by mouth daily.  . pravastatin (PRAVACHOL) 40 MG tablet Take 40 mg by mouth every evening.  . prochlorperazine (COMPAZINE) 5 MG tablet Take 1 tablet (5 mg total) by mouth every 6 (six) hours as needed for nausea or vomiting.  . REVLIMID 15 MG capsule   . traMADol (ULTRAM) 50 MG tablet Take 0.5-1 tablets (25-50 mg total) by mouth every 8 (eight) hours as needed.   Facility-Administered Encounter Medications as of 12/04/2016  Medication  . ondansetron (ZOFRAN) tablet 8 mg  . ondansetron (ZOFRAN) tablet 8 mg     REVIEW OF SYSTEMS  : All other systems reviewed and negative except where noted in the History of Present Illness.   PHYSICAL EXAM: BP 104/70 (BP Location: Left Arm, Patient Position: Sitting, Cuff Size: Normal)   Pulse (!) 144 Comment: irregular-all over the place 1 min its, 74, 96, then 144  Ht 5' 2.5" (1.588 m) Comment: height measured without shoes  Wt 146 lb 6 oz (66.4 kg)   BMI 26.35 kg/m  General: Well developed black female in no acute distress Head: Normocephalic and atraumatic Eyes:  Sclerae anicteric, conjunctiva pink. Ears: Normal auditory acuity Lungs: Clear throughout to auscultation Heart: Regular rate and rhythm Abdomen: Soft, non-distended.  Normal bowel sounds.  Mild diffuse TTP. Musculoskeletal: Symmetrical with no gross deformities  Skin: No lesions on visible extremities Extremities: No edema  Neurological: Alert oriented x 4, grossly non-focal Psychological:  Alert and cooperative. Normal mood and affect  ASSESSMENT AND PLAN: -70 year old female with multiple myeloma with complaints of nausea, vomiting, and abdominal pain.  She has undergone extensive evaluation for these symptoms but no specific cause identified. EGD showed grade C esophagitis, which very well could've been the result of her vomiting. She is  feeling better at this point, but still has some nausea intermittently. Our recommendations were to continue twice a day PPI indefinitely and to continue moving her bowels well with MiraLAX.  Currently complaining of poor appetite and weight loss.  I don't know what more we have to offer from a GI standpoint but if not contraindicated with her myeloma treatment then she could try Remeron starting at 15 mg at bedtime for appetite stimulant.  Continue 2-3 Ensure per day.  CC:  Avbuere, Edwin, MD  Addendum: Reviewed and agree with management. Jay M Pyrtle, MD    

## 2016-12-05 ENCOUNTER — Telehealth: Payer: Self-pay

## 2016-12-05 NOTE — Telephone Encounter (Signed)
-----   Message from Owens Shark, NP sent at September 18, 202018  4:11 PM EST ----- Please let her know labs indicate she may be mildly dehydrated. Have her increase fluid intake.

## 2016-12-05 NOTE — Telephone Encounter (Signed)
Called and informed pt to increase fluid intake at home, labs from 2/14 indicated mild dehydration. Pt states she will drink more fluids. Pt denies any questions or concerns at this time.

## 2016-12-06 ENCOUNTER — Other Ambulatory Visit: Payer: Self-pay

## 2016-12-06 ENCOUNTER — Inpatient Hospital Stay (HOSPITAL_COMMUNITY)
Admission: EM | Admit: 2016-12-06 | Discharge: 2016-12-09 | DRG: 312 | Disposition: A | Discharge status: Home Health | Payer: Medicare Other | Attending: Internal Medicine | Admitting: Internal Medicine

## 2016-12-06 ENCOUNTER — Observation Stay (HOSPITAL_COMMUNITY): Payer: Medicare Other

## 2016-12-06 DIAGNOSIS — F329 Major depressive disorder, single episode, unspecified: Secondary | ICD-10-CM | POA: Diagnosis present

## 2016-12-06 DIAGNOSIS — E860 Dehydration: Secondary | ICD-10-CM | POA: Diagnosis not present

## 2016-12-06 DIAGNOSIS — Z79899 Other long term (current) drug therapy: Secondary | ICD-10-CM

## 2016-12-06 DIAGNOSIS — R110 Nausea: Secondary | ICD-10-CM

## 2016-12-06 DIAGNOSIS — C9000 Multiple myeloma not having achieved remission: Secondary | ICD-10-CM | POA: Diagnosis not present

## 2016-12-06 DIAGNOSIS — I495 Sick sinus syndrome: Secondary | ICD-10-CM | POA: Diagnosis not present

## 2016-12-06 DIAGNOSIS — I10 Essential (primary) hypertension: Secondary | ICD-10-CM | POA: Diagnosis not present

## 2016-12-06 DIAGNOSIS — R55 Syncope and collapse: Secondary | ICD-10-CM | POA: Diagnosis not present

## 2016-12-06 DIAGNOSIS — F17210 Nicotine dependence, cigarettes, uncomplicated: Secondary | ICD-10-CM | POA: Diagnosis not present

## 2016-12-06 DIAGNOSIS — R1032 Left lower quadrant pain: Secondary | ICD-10-CM | POA: Diagnosis present

## 2016-12-06 DIAGNOSIS — I951 Orthostatic hypotension: Principal | ICD-10-CM | POA: Diagnosis present

## 2016-12-06 DIAGNOSIS — F419 Anxiety disorder, unspecified: Secondary | ICD-10-CM | POA: Diagnosis present

## 2016-12-06 DIAGNOSIS — I2510 Atherosclerotic heart disease of native coronary artery without angina pectoris: Secondary | ICD-10-CM | POA: Diagnosis present

## 2016-12-06 DIAGNOSIS — R262 Difficulty in walking, not elsewhere classified: Secondary | ICD-10-CM

## 2016-12-06 DIAGNOSIS — Z7982 Long term (current) use of aspirin: Secondary | ICD-10-CM

## 2016-12-06 DIAGNOSIS — E785 Hyperlipidemia, unspecified: Secondary | ICD-10-CM | POA: Diagnosis present

## 2016-12-06 DIAGNOSIS — Z955 Presence of coronary angioplasty implant and graft: Secondary | ICD-10-CM | POA: Diagnosis not present

## 2016-12-06 DIAGNOSIS — I252 Old myocardial infarction: Secondary | ICD-10-CM | POA: Diagnosis not present

## 2016-12-06 DIAGNOSIS — K219 Gastro-esophageal reflux disease without esophagitis: Secondary | ICD-10-CM | POA: Diagnosis present

## 2016-12-06 LAB — BASIC METABOLIC PANEL
ANION GAP: 12 (ref 5–15)
BUN: 9 mg/dL (ref 6–20)
CO2: 24 mmol/L (ref 22–32)
Calcium: 10 mg/dL (ref 8.9–10.3)
Chloride: 103 mmol/L (ref 101–111)
Creatinine, Ser: 1.06 mg/dL — ABNORMAL HIGH (ref 0.44–1.00)
GFR calc Af Amer: >60 mL/min (ref >60)
GFR, EST NON AFRICAN AMERICAN: 52 mL/min — AB (ref >60)
GLUCOSE: 119 mg/dL — AB (ref 65–99)
POTASSIUM: 3.9 mmol/L (ref 3.5–5.1)
Sodium: 139 mmol/L (ref 135–145)

## 2016-12-06 LAB — TSH: TSH: 0.541 u[IU]/mL (ref 0.350–4.500)

## 2016-12-06 LAB — CBC
HEMATOCRIT: 46.3 % — AB (ref 36.0–46.0)
HEMOGLOBIN: 15.2 g/dL — AB (ref 12.0–15.0)
MCH: 30 pg (ref 26.0–34.0)
MCHC: 32.8 g/dL (ref 30.0–36.0)
MCV: 91.5 fL (ref 78.0–100.0)
Platelets: 273 10*3/uL (ref 150–400)
RBC: 5.06 MIL/uL (ref 3.87–5.11)
RDW: 15.8 % — ABNORMAL HIGH (ref 11.5–15.5)
WBC: 5.1 10*3/uL (ref 4.0–10.5)

## 2016-12-06 LAB — TROPONIN I: Troponin I: 0.08 ng/mL (ref <0.03)

## 2016-12-06 LAB — URINALYSIS, ROUTINE W REFLEX MICROSCOPIC
Bilirubin Urine: NEGATIVE
GLUCOSE, UA: NEGATIVE mg/dL
HGB URINE DIPSTICK: NEGATIVE
Ketones, ur: 5 mg/dL — AB
Leukocytes, UA: NEGATIVE
Nitrite: NEGATIVE
Protein, ur: NEGATIVE mg/dL
SPECIFIC GRAVITY, URINE: 1.008 (ref 1.005–1.030)
pH: 5 (ref 5.0–8.0)

## 2016-12-06 LAB — I-STAT TROPONIN, ED: Troponin i, poc: 0.02 ng/mL (ref 0.00–0.08)

## 2016-12-06 LAB — D-DIMER, QUANTITATIVE: D-Dimer, Quant: 0.38 ug/mL-FEU (ref 0.00–0.50)

## 2016-12-06 MED ORDER — SODIUM CHLORIDE 0.9 % IV BOLUS (SEPSIS)
1000.0000 mL | Freq: Once | INTRAVENOUS | Status: AC
  Administered 2016-12-06: 1000 mL via INTRAVENOUS

## 2016-12-06 MED ORDER — ENOXAPARIN SODIUM 40 MG/0.4ML ~~LOC~~ SOLN
40.0000 mg | SUBCUTANEOUS | Status: DC
  Administered 2016-12-06 – 2016-12-08 (×3): 40 mg via SUBCUTANEOUS
  Filled 2016-12-06 (×3): qty 0.4

## 2016-12-06 MED ORDER — VITAMIN C 500 MG PO TABS
1000.0000 mg | ORAL_TABLET | Freq: Every day | ORAL | Status: DC
  Administered 2016-12-07 – 2016-12-09 (×3): 1000 mg via ORAL
  Filled 2016-12-06 (×3): qty 2

## 2016-12-06 MED ORDER — ACETAMINOPHEN 325 MG PO TABS
650.0000 mg | ORAL_TABLET | Freq: Four times a day (QID) | ORAL | Status: DC | PRN
  Administered 2016-12-06 – 2016-12-07 (×2): 650 mg via ORAL
  Filled 2016-12-06 (×2): qty 2

## 2016-12-06 MED ORDER — PRAVASTATIN SODIUM 40 MG PO TABS
40.0000 mg | ORAL_TABLET | Freq: Every evening | ORAL | Status: DC
  Administered 2016-12-06 – 2016-12-09 (×4): 40 mg via ORAL
  Filled 2016-12-06 (×5): qty 1

## 2016-12-06 MED ORDER — ACETAMINOPHEN 650 MG RE SUPP: 650.0000 mg | Freq: Four times a day (QID) | RECTAL | Status: DC | PRN

## 2016-12-06 MED ORDER — AMLODIPINE BESYLATE 10 MG PO TABS
10.0000 mg | ORAL_TABLET | Freq: Every day | ORAL | Status: DC
  Administered 2016-12-06: 10 mg via ORAL
  Filled 2016-12-06: qty 1

## 2016-12-06 MED ORDER — POTASSIUM CHLORIDE IN NACL 20-0.9 MEQ/L-% IV SOLN
INTRAVENOUS | Status: DC
  Administered 2016-12-06: 21:00:00 via INTRAVENOUS
  Filled 2016-12-06 (×2): qty 1000

## 2016-12-06 MED ORDER — ONDANSETRON HCL 4 MG PO TABS: 4.0000 mg | ORAL_TABLET | Freq: Four times a day (QID) | ORAL | Status: DC | PRN

## 2016-12-06 MED ORDER — SODIUM CHLORIDE 0.9% FLUSH
3.0000 mL | Freq: Two times a day (BID) | INTRAVENOUS | Status: DC
  Administered 2016-12-06 – 2016-12-08 (×2): 3 mL via INTRAVENOUS

## 2016-12-06 MED ORDER — ENSURE ENLIVE PO LIQD
237.0000 mL | Freq: Two times a day (BID) | ORAL | Status: DC
  Administered 2016-12-08 – 2016-12-09 (×3): 237 mL via ORAL

## 2016-12-06 MED ORDER — ASPIRIN EC 81 MG PO TBEC
81.0000 mg | DELAYED_RELEASE_TABLET | Freq: Every day | ORAL | Status: DC
  Administered 2016-12-07 – 2016-12-09 (×3): 81 mg via ORAL
  Filled 2016-12-06 (×3): qty 1

## 2016-12-06 MED ORDER — FLUOXETINE HCL 10 MG PO CAPS
10.0000 mg | ORAL_CAPSULE | Freq: Every day | ORAL | Status: DC
  Administered 2016-12-06 – 2016-12-08 (×3): 10 mg via ORAL
  Filled 2016-12-06 (×5): qty 1

## 2016-12-06 MED ORDER — ASPIRIN 325 MG PO TABS: 325.0000 mg | ORAL_TABLET | Freq: Every day | ORAL | Status: DC

## 2016-12-06 MED ORDER — ISOSORBIDE MONONITRATE ER 60 MG PO TB24: 60.0000 mg | ORAL_TABLET | Freq: Every day | ORAL | Status: DC

## 2016-12-06 MED ORDER — PANTOPRAZOLE SODIUM 40 MG PO TBEC
40.0000 mg | DELAYED_RELEASE_TABLET | Freq: Two times a day (BID) | ORAL | Status: DC
  Administered 2016-12-07 – 2016-12-09 (×6): 40 mg via ORAL
  Filled 2016-12-06 (×6): qty 1

## 2016-12-06 MED ORDER — HYDROMORPHONE HCL 1 MG/ML IJ SOLN
0.5000 mg | INTRAMUSCULAR | Status: AC | PRN
  Administered 2016-12-06 – 2016-12-07 (×2): 0.5 mg via INTRAVENOUS
  Filled 2016-12-06 (×2): qty 1

## 2016-12-06 MED ORDER — ACYCLOVIR 400 MG PO TABS
400.0000 mg | ORAL_TABLET | Freq: Two times a day (BID) | ORAL | Status: DC
  Administered 2016-12-06 – 2016-12-09 (×6): 400 mg via ORAL
  Filled 2016-12-06 (×7): qty 1

## 2016-12-06 MED ORDER — ONDANSETRON HCL 4 MG/2ML IJ SOLN
4.0000 mg | Freq: Four times a day (QID) | INTRAMUSCULAR | Status: DC | PRN
  Administered 2016-12-07 – 2016-12-08 (×2): 4 mg via INTRAVENOUS
  Filled 2016-12-06 (×2): qty 2

## 2016-12-06 MED ORDER — PROCHLORPERAZINE MALEATE 5 MG PO TABS
5.0000 mg | ORAL_TABLET | Freq: Four times a day (QID) | ORAL | Status: DC | PRN
  Filled 2016-12-06: qty 1

## 2016-12-06 NOTE — Progress Notes (Signed)
Pt has arrived to [place] from ED. Pt placed on tele and oriented to room. Pt complains of LLQ pain. meds given. MD paged. Plan of care reviewed with the pt. Will continue to monitor.

## 2016-12-06 NOTE — ED Provider Notes (Signed)
[facility] DEPT Provider Note   CSN: [id number] Arrival date & time: 12/06/16  1515     History   Chief Complaint Chief Complaint  Patient presents with  . Weakness    HPI Abigail [name] is a 71 y.o. female.Complains of syncopal episode 2 times today. Accompanied by generalized weakness immediately preceding the syncopal episodes. She is presently asymptomatic. She denies any chest pain or shortness of breath. No treatment prior to coming here.  HPI  Past Medical History:  Diagnosis Date  . Anemia 07/2015  . Bilateral renal artery stenosis ([place]) 1999   s/p stenting  . Coronary artery disease 1999  . Depression   . Diverticulosis   . Duodenitis   . Gastric erosions   . Gastric ulcer   . GERD (gastroesophageal reflux disease)   . Heart murmur   . History of blood transfusion ~ 03/2016   "low HgB; practically nonexistent"  . Hyperlipidemia   . Hypertension   . MI (myocardial infarction) 1999  . Multiple myeloma ([place])   . Retinal hemorrhage, right eye   . Schatzki's ring   . Tubular adenoma of colon     Patient Active Problem List   Diagnosis Date Noted  . Nausea and vomiting in adult patient   . Erosive esophagitis   . Protein-calorie malnutrition, severe 11/19/2016  . LLQ pain   . LUQ abdominal pain   . Intractable nausea and vomiting 11/17/2016  . Abdominal pain 11/05/2016  . Multiple myeloma ([place]) 06/18/2016  . Malnutrition of moderate degree 06/14/2016  . 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 Exertional dyspnea 11/01/2015  . Coronary atherosclerosis of native coronary artery 11/01/2015  . Shortness of breath 11/01/2015  . Chest pain 12/31/2011    Past Surgical History:  Procedure Laterality Date  . APPENDECTOMY    . BACK SURGERY    . CATARACT EXTRACTION W/ INTRAOCULAR LENS IMPLANT Right 2014  . CORONARY ANGIOPLASTY WITH STENT PLACEMENT  1999  . ESOPHAGOGASTRODUODENOSCOPY N/A 11/03/2015   Procedure:  ESOPHAGOGASTRODUODENOSCOPY (EGD);  Surgeon: Carol Ada, MD;  Location: [facility] ENDOSCOPY;  Service: Endoscopy;  Laterality: N/A;  . ESOPHAGOGASTRODUODENOSCOPY (EGD) WITH PROPOFOL N/A 11/18/2016   Procedure: ESOPHAGOGASTRODUODENOSCOPY (EGD) WITH PROPOFOL;  Surgeon: Ladene Artist, MD;  Location: [facility] ENDOSCOPY;  Service: Endoscopy;  Laterality: N/A;  . LUMBAR [place]    . RENAL ARTERY STENT  1999   bil RAS so ? bil vs unilateral stents  . RETINAL LASER PROCEDURE Right 2012   "bleeding"  . TONSILLECTOMY    . TOTAL ABDOMINAL HYSTERECTOMY      OB History    No data available       Home Medications    Prior to Admission medications   Medication Sig Start Date End Date Taking? Authorizing Provider  acyclovir (ZOVIRAX) 400 MG tablet TAKE 1 TABLET (400 MG TOTAL) BY MOUTH 2 (TWO) TIMES DAILY. 08/29/16  Yes Ladell Pier, MD  amLODipine (NORVASC) 10 MG tablet Take 10 mg by mouth at bedtime.  01/03/16  Yes Historical Provider, MD  Ascorbic Acid (VITAMIN C) 1000 MG tablet Take 1,000 mg by mouth daily.    Yes Historical Provider, MD  aspirin EC 81 MG tablet Take 81 mg by mouth daily.   Yes Historical Provider, MD  calcium carbonate (OS-CAL - DOSED IN MG OF ELEMENTAL CALCIUM) 1250 (500 Ca) MG tablet Take 1 tablet (500 mg of elemental calcium total) by mouth daily with breakfast. 06/19/16  Yes [name],  MD  Cholecalciferol (VITAMIN D PO) Take 1 capsule by mouth daily.   Yes Historical Provider, MD  dexamethasone (DECADRON) 4 MG tablet TAKE 40 MG (10 TABLETS) ONCE A WEEK ON DAY OF VELCADE INJECTION 10/02/16  Yes Historical Provider, MD  docusate sodium (COLACE) 100 MG capsule Take 1 capsule (100 mg total) by mouth 2 (two) times daily. 11/21/16  Yes Modena Jansky, MD  feeding supplement, ENSURE ENLIVE, (ENSURE ENLIVE) LIQD Take 237 mLs by mouth 2 (two) times daily between meals. 11/27/16  Yes Mariel Aloe, MD  FLUoxetine (PROZAC) 10 MG capsule Take 10 mg by mouth at bedtime.    Yes Historical  Provider, MD  isosorbide mononitrate (IMDUR) 60 MG 24 hr tablet Take 1 tablet (60 mg total) by mouth daily. 06/19/16  Yes [name], MD  KLOR-CON M10 10 MEQ tablet Take 10 mEq by mouth daily.  06/13/16  Yes Historical Provider, MD  ondansetron (ZOFRAN) 4 MG tablet Take 1 tablet (4 mg total) by mouth every 8 (eight) hours as needed for nausea or vomiting. 11/27/16  Yes Mariel Aloe, MD  pantoprazole (PROTONIX) 40 MG tablet Take 1 tablet (40 mg total) by mouth 2 (two) times daily before a meal. 11/21/16  Yes Modena Jansky, MD  polyethylene glycol (MIRALAX / GLYCOLAX) packet Take 17 g by mouth daily. Patient taking differently: Take 17 g by mouth 2 (two) times daily.  11/27/16  Yes Mariel Aloe, MD  pravastatin (PRAVACHOL) 40 MG tablet Take 40 mg by mouth every evening. 06/03/15  Yes Historical Provider, MD  prochlorperazine (COMPAZINE) 5 MG tablet Take 1 tablet (5 mg total) by mouth every 6 (six) hours as needed for nausea or vomiting. 11/03/16  Yes Ladell Pier, MD  traMADol (ULTRAM) 50 MG tablet Take 0.5-1 tablets (25-50 mg total) by mouth every 8 (eight) hours as needed. 12/03/16  Yes Owens Shark, NP  feeding supplement (BOOST / RESOURCE BREEZE) LIQD Take 1 Container by mouth 3 (three) times daily between meals. Patient not taking: Reported on 12/06/2016 11/27/16   Mariel Aloe, MD    Family History Family History  Problem Relation Age of Onset  . Colon cancer Neg Hx     Social History Social History  Substance Use Topics  . Smoking status: Current Every Day Smoker    Packs/day: 0.50    Years: 48.00    Types: Cigarettes  . Smokeless tobacco: Never Used  . Alcohol use No     Allergies   Sulfa antibiotics   Review of Systems Review of Systems  HENT: Negative.   Respiratory: Negative.   Cardiovascular: Negative.        Syncope  Gastrointestinal: Negative.   Musculoskeletal: Negative.   Skin: Negative.   Allergic/Immunologic: Positive for immunocompromised state.        Cancer patient  Neurological: Positive for weakness.       Generalized weakness  Psychiatric/Behavioral: Negative.   All other systems reviewed and are negative.    Physical Exam Updated Vital Signs BP 146/75   Pulse (!) 47   Temp 98.3 F (36.8 C) (Oral)   Resp 16   SpO2 99%   Physical Exam  Constitutional: She appears well-developed and well-nourished.  HENT:  Head: Normocephalic and atraumatic.  Eyes: Conjunctivae are normal. Pupils are equal, round, and reactive to light.  Neck: Neck supple. No tracheal deviation present. No thyromegaly present.  Cardiovascular: Normal rate and regular rhythm.   No murmur heard. Pulmonary/Chest: Effort  normal and breath sounds normal.  Abdominal: Soft. Bowel sounds are normal. She exhibits no distension. There is no tenderness.  Musculoskeletal: Normal range of motion. She exhibits no edema or tenderness.  Neurological: She is alert. Coordination normal.  Skin: Skin is warm and dry. No rash noted.  Psychiatric: She has a normal mood and affect.  Nursing note and vitals reviewed.    ED Treatments / Results  Labs (all labs ordered are listed, but only abnormal results are displayed) Labs Reviewed  BASIC METABOLIC PANEL  CBC  D-DIMER, QUANTITATIVE (NOT AT [facility])  Randolm Idol, ED    EKG  EKG Interpretation  Date/Time:  Saturday December 06 2016 15:26:23 EST Ventricular Rate:  135 PR Interval:    QRS Duration: 86 QT Interval:  363 QTC Calculation: 545 R Axis:   -2 Text Interpretation:  Sinus tachycardia with irregular rate Consider left ventricular hypertrophy Nonspecific T abnormalities, lateral leads Prolonged QT interval SINCE LAST TRACING HEART RATE HAS INCREASED Confirmed by Winfred Leeds  MD, Anfernee Peschke [phone]) on 12/06/2016 3:34:04 PM     ED ECG REPORT   Date: 12/06/2016 348 pm  Rate: 65  Rhythm: normal sinus rhythm  QRS Axis: left  Intervals: normal  ST/T Wave abnormalities: nonspecific T wave changes  Conduction  Disutrbances:none  Narrative Interpretation:   Old EKG Reviewed: Rate significantly slower than EKG performed at 326 PM today  I have personally reviewed the EKG tracing and agree with the computerized printout as noted.  Radiology No results found.  Procedures Procedures (including critical care time)  Medications Ordered in ED Medications  sodium chloride 0.9 % bolus 1,000 mL (not administered)   Patient became mildly lightheaded on standing however heart rate quickly jumps from a possibly 70-140,  Initial Impression / Assessment and Plan / ED Course  I have reviewed the triage vital signs and the nursing notes.  Pertinent labs & imaging results that were available during my care of the patient were reviewed by me and considered in my medical decision making (see chart for details).     Pt signed out to Dr Johnney Killian at 445pm Results for orders placed or performed during the hospital encounter of 12/06/16  CBC  Result Value Ref Range   WBC 5.1 4.0 - 10.5 K/uL   RBC 5.06 3.87 - 5.11 MIL/uL   Hemoglobin 15.2 (H) 12.0 - 15.0 g/dL   HCT 46.3 (H) 36.0 - 46.0 %   MCV 91.5 78.0 - 100.0 fL   MCH 30.0 26.0 - 34.0 pg   MCHC 32.8 30.0 - 36.0 g/dL   RDW 15.8 (H) 11.5 - 15.5 %   Platelets 273 150 - 400 K/uL  I-stat troponin, ED  Result Value Ref Range   Troponin i, poc 0.02 0.00 - 0.08 ng/mL   Comment 3           Mr Abdomen W Wo Contrast  Result Date: 11/18/2016 CLINICAL DATA:  Evaluate right renal lesion seen on recent CT and ultrasound examinations. EXAM: MRI ABDOMEN WITHOUT AND WITH CONTRAST TECHNIQUE: Multiplanar multisequence MR imaging of the abdomen was performed both before and after the administration of intravenous contrast. CONTRAST:  76m MULTIHANCE GADOBENATE DIMEGLUMINE 529 MG/ML IV SOLN COMPARISON:  CT scan 10/27/2016 and ultrasound 11/10/2016 FINDINGS: Lower chest: The lung bases are grossly clear. No worrisome pulmonary lesions. No pleural or pericardial effusion.  Hepatobiliary: No focal hepatic lesions or intrahepatic biliary dilatation. The gallbladder is normal. No common bile duct dilatation. The portal and hepatic veins are  normal. Pancreas:  No mass, inflammation or ductal dilatation. Spleen:  Normal size.  No focal lesions. Adrenals/Urinary Tract:  The adrenal glands are unremarkable. There are multiple small bilateral renal cysts. The lower pole right renal lesion demonstrates increased T1 signal intensity and no contrast enhancement. This is consistent with a benign hemorrhagic or proteinaceous cyst. The small interpolar lesion involving the left kidney is also a small hemorrhagic or proteinaceous cyst. No worrisome renal lesions. No hydronephrosis. Stomach/Bowel: Visualized portions within the abdomen are unremarkable. Vascular/Lymphatic: Normal caliber aorta. Moderate atherosclerotic calcifications. Calcifications noted at the major branch vessel ostia. Bilateral renal artery stents are noted. No aneurysm or dissection. The major venous structures are patent. No mesenteric or retroperitoneal mass or adenopathy. Other: No ascites or abdominal wall hernia. No subcutaneous lesions. Musculoskeletal: Small scattered hemangiomas are noted. IMPRESSION: 1. Multiple bilateral renal cysts. Most of these are simple cysts. The 2 lesions in question on the CT scan are both hemorrhagic or proteinaceous cysts. No worrisome contrast enhancement. 2. No acute abdominal findings, mass lesions or adenopathy. Electronically Signed   By: Marijo Sanes M.D.   On: 11/18/2016 10:07   Ct Abdomen Pelvis W Contrast  Result Date: 11/19/2016 CLINICAL DATA:  Left lower quadrant pain.  Nausea and vomiting. EXAM: CT ABDOMEN AND PELVIS WITH CONTRAST TECHNIQUE: Multidetector CT imaging of the abdomen and pelvis was performed using the standard protocol following bolus administration of intravenous contrast. CONTRAST:  125m ISOVUE-300 IOPAMIDOL (ISOVUE-300) INJECTION 61% COMPARISON:  Abdominal  MRI 11/18/2016. CT abdomen and pelvis 10/27/2016. FINDINGS: Lower chest: Subsegmental atelectasis in the lung bases. At most trace pleural fluid bilaterally. Partially visualize coronary artery atherosclerosis. Hepatobiliary: No focal liver abnormality is seen. No gallstones, gallbladder wall thickening, or biliary dilatation. Pancreas: Unremarkable. Spleen: Unremarkable. Adrenals/Urinary Tract: Unremarkable adrenal glands. Small bilateral renal lesions more fully characterized on yesterday's MRI. No hydronephrosis. No renal calculi. Unremarkable bladder. Stomach/Bowel: Small sliding hiatal hernia. No evidence of bowel obstruction. Scattered left-sided colonic diverticula without evidence of diverticulitis. Prior appendectomy. Vascular/Lymphatic: Abdominal aortic atherosclerosis without aneurysm. Bilateral renal stents which are grossly patent. No enlarged lymph nodes in the abdomen or pelvis. Reproductive: Status post hysterectomy. No adnexal masses. Other: No intraperitoneal free fluid. No abdominal wall mass or hernia. Musculoskeletal: 1.6 cm lytic lesion in the right aspect of the T12 vertebral body, enlarged from 07/24/2015 an new from 12/31/2011. Unchanged L1 vertebral body hemangioma. Grade 1 retrolisthesis of T12 on L1 and L1 on L2. Multilevel disc degeneration, advanced at T12-L1 and L5-S1. IMPRESSION: 1. No acute abnormality identified in the abdomen or pelvis. 2. 1.6 cm T12 vertebral body lesion, enlarged from 2016. This likely reflects patient's known multiple myeloma, although a metastasis from a non-hematologic malignancy is also possible. 3. Aortic atherosclerosis. Electronically Signed   By: ALogan BoresM.D.   On: 11/19/2016 15:05   Nm Hepato W/eject Fract  Result Date: 11/25/2016 CLINICAL DATA:  Abdominal pain, nausea and vomiting over the last 2 days EXAM: NUCLEAR MEDICINE HEPATOBILIARY IMAGING WITH GALLBLADDER EF TECHNIQUE: Sequential images of the abdomen were obtained out to 60 minutes  following intravenous administration of radiopharmaceutical. After slow intravenous infusion of 1.4 micrograms Cholecystokinin, gallbladder ejection fraction was determined. RADIOPHARMACEUTICALS:  5.4 mCi Tc-933mholetec IV COMPARISON:  CT abdomen and pelvis of 11/19/2016 and ultrasound abdomen of 11/10/2016 FINDINGS: Prompt uptake and biliary excretion of activity by the liver is seen. Gallbladder activity is visualized, consistent with patency of cystic duct. Biliary activity passes into small bowel, consistent with patent common  bile duct. Calculated gallbladder ejection fraction is 78%. (At 60 min, normal ejection fraction is greater than 40%.) IMPRESSION: 1. Normal nuclear medicine hepatobiliary scan. 2. Normal gallbladder ejection fraction of 78%. Electronically Signed   By: Ivar Drape M.D.   On: 11/25/2016 16:12   Dg Abdomen Acute W/chest  Result Date: 11/15/2016 CLINICAL DATA:  Initial evaluation for acute left lower quadrant pain for 3 weeks. History of multiple myeloma. EXAM: DG ABDOMEN ACUTE W/ 1V CHEST COMPARISON:  Prior CT from 10/27/2016. FINDINGS: Cardiac and mediastinal silhouettes are within normal limits. Scattered atheromatous plaque noted within the aortic arch. Lungs normally inflated. Linear opacities of the left lung base most compatible with atelectasis. No other focal airspace disease. No pulmonary edema or pleural effusion. No pneumothorax. Bowel gas pattern within normal limits without evidence for obstruction or ileus. No abnormal bowel wall thickening. Moderate to large amount of retained stool within the right colon. No soft tissue mass or abnormal calcification. Vascular stent overlies the left upper abdomen. No acute osseous abnormality. IMPRESSION: 1. Nonobstructive bowel gas pattern with no radiographic evidence for acute intra-abdominal process. 2. Moderate to large amount of retained stool within the right colon. 3. Mild left basilar atelectasis. No other active  cardiopulmonary disease. 4. Aortic atherosclerosis. Electronically Signed   By: Jeannine Boga M.D.   On: 11/15/2016 00:07   Dg Abd Portable 1v  Result Date: 11/23/2016 CLINICAL DATA:  Vomiting.  Right lower quadrant pain. EXAM: PORTABLE ABDOMEN - 1 VIEW COMPARISON:  November 14, 2016 FINDINGS: Vascular stent in the left upper quadrant. No free air, portal venous gas, or pneumatosis. No bowel obstruction. Fecal loading in the colon. Mottled appearance to the bones, likely due to the reported history of multiple myeloma. IMPRESSION: Fecal loading in the colon.  No other acute abnormalities. Electronically Signed   By: Dorise Bullion III M.D   On: 11/23/2016 15:53   Final Clinical Impressions(s) / ED Diagnoses  Dx syncope Final diagnoses:  None    New Prescriptions New Prescriptions   No medications on file     Orlie Dakin, MD 12/06/16 [phone]

## 2016-12-06 NOTE — ED Notes (Signed)
Attempted report 

## 2016-12-06 NOTE — H&P (Signed)
History and Physical    Abigail [name] YSA:[id number] DOB: 21-Apr-1946 DOA: 12/06/2016  PCP: Philis Fendt, MD  Patient coming from: Home  Chief Complaint: Syncopal episode  HPI: Abigail [name] is a 71 y.o. female with medical history significant of hypertension, acid reflux, gastritis, coronary artery disease status post stent on aspirin, dyslipidemia, multiple myeloma on outpatient chemotherapy, recently admitted for nausea vomiting underwent GI evaluation, discharge with MiraLAX and bowel regimen presented with syncopal episode today. She reported that she felt lightheadedness and dizziness and generalized weakness which lead to syncopal episode. The first episode around 10 AM today which was on weakness. In the afternoon patient had similar episode and reported losing her consciousness for a brief period of time. Patient reported that since recent hospital discharge she was only drinking liquids but has not eating much. She was using MiraLAX and other bowel regimen therefore she was going bathroom more often. She had 4 times bowel movement today. She denied fever, chills, headache, nausea, vomiting him a chest pain, shortness of breath, dysuria, urgency. ED Course: Treated with IV fluid. Admitted for further evaluation. Labs unremarkable. Patient was found to have episode of tachycardia. As per ER physician the pulse of 35 recorded in the computer is not true.  Review of Systems: As per HPI otherwise 10 point review of systems negative.    Past Medical History:  Diagnosis Date  . Anemia 07/2015  . Bilateral renal artery stenosis ([place]) 1999   s/p stenting  . Coronary artery disease 1999  . Depression   . Diverticulosis   . Duodenitis   . Gastric erosions   . Gastric ulcer   . GERD (gastroesophageal reflux disease)   . Heart murmur   . History of blood transfusion ~ 03/2016   "low HgB; practically nonexistent"  . Hyperlipidemia   . Hypertension   . MI (myocardial infarction) 1999  .  Multiple myeloma ([place])   . Retinal hemorrhage, right eye   . Schatzki's ring   . Tubular adenoma of colon     Past Surgical History:  Procedure Laterality Date  . APPENDECTOMY    . BACK SURGERY    . CATARACT EXTRACTION W/ INTRAOCULAR LENS IMPLANT Right 2014  . CORONARY ANGIOPLASTY WITH STENT PLACEMENT  1999  . ESOPHAGOGASTRODUODENOSCOPY N/A 11/03/2015   Procedure: ESOPHAGOGASTRODUODENOSCOPY (EGD);  Surgeon: Carol Ada, MD;  Location: [facility] ENDOSCOPY;  Service: Endoscopy;  Laterality: N/A;  . ESOPHAGOGASTRODUODENOSCOPY (EGD) WITH PROPOFOL N/A 11/18/2016   Procedure: ESOPHAGOGASTRODUODENOSCOPY (EGD) WITH PROPOFOL;  Surgeon: Ladene Artist, MD;  Location: [facility] ENDOSCOPY;  Service: Endoscopy;  Laterality: N/A;  . LUMBAR [place]    . RENAL ARTERY STENT  1999   bil RAS so ? bil vs unilateral stents  . RETINAL LASER PROCEDURE Right 2012   "bleeding"  . TONSILLECTOMY    . TOTAL ABDOMINAL HYSTERECTOMY       Social history: reports that she has been smoking Cigarettes.  She has a 24.00 pack-year smoking history. She has never used smokeless tobacco. She reports that she does not drink alcohol or use drugs.  Allergies  Allergen Reactions  . Sulfa Antibiotics Rash    Family History  Problem Relation Age of Onset  . Colon cancer Neg Hx      Prior to Admission medications   Medication Sig Start Date End Date Taking? Authorizing Provider  acyclovir (ZOVIRAX) 400 MG tablet TAKE 1 TABLET (400 MG TOTAL) BY MOUTH 2 (TWO) TIMES DAILY. 08/29/16  Yes Izola Price  Sherrill, MD  amLODipine (NORVASC) 10 MG tablet Take 10 mg by mouth at bedtime.  01/03/16  Yes Historical Provider, MD  Ascorbic Acid (VITAMIN C) 1000 MG tablet Take 1,000 mg by mouth daily.    Yes Historical Provider, MD  aspirin EC 81 MG tablet Take 81 mg by mouth daily.   Yes Historical Provider, MD  calcium carbonate (OS-CAL - DOSED IN MG OF ELEMENTAL CALCIUM) 1250 (500 Ca) MG tablet Take 1 tablet (500 mg of elemental calcium total) by  mouth daily with breakfast. 06/19/16  Yes [name], MD  Cholecalciferol (VITAMIN D PO) Take 1 capsule by mouth daily.   Yes Historical Provider, MD  dexamethasone (DECADRON) 4 MG tablet TAKE 40 MG (10 TABLETS) ONCE A WEEK ON DAY OF VELCADE INJECTION 10/02/16  Yes Historical Provider, MD  docusate sodium (COLACE) 100 MG capsule Take 1 capsule (100 mg total) by mouth 2 (two) times daily. 11/21/16  Yes Modena Jansky, MD  feeding supplement, ENSURE ENLIVE, (ENSURE ENLIVE) LIQD Take 237 mLs by mouth 2 (two) times daily between meals. 11/27/16  Yes Mariel Aloe, MD  FLUoxetine (PROZAC) 10 MG capsule Take 10 mg by mouth at bedtime.    Yes Historical Provider, MD  isosorbide mononitrate (IMDUR) 60 MG 24 hr tablet Take 1 tablet (60 mg total) by mouth daily. 06/19/16  Yes [name], MD  KLOR-CON M10 10 MEQ tablet Take 10 mEq by mouth daily.  06/13/16  Yes Historical Provider, MD  ondansetron (ZOFRAN) 4 MG tablet Take 1 tablet (4 mg total) by mouth every 8 (eight) hours as needed for nausea or vomiting. 11/27/16  Yes Mariel Aloe, MD  pantoprazole (PROTONIX) 40 MG tablet Take 1 tablet (40 mg total) by mouth 2 (two) times daily before a meal. 11/21/16  Yes Modena Jansky, MD  polyethylene glycol (MIRALAX / GLYCOLAX) packet Take 17 g by mouth daily. Patient taking differently: Take 17 g by mouth 2 (two) times daily.  11/27/16  Yes Mariel Aloe, MD  pravastatin (PRAVACHOL) 40 MG tablet Take 40 mg by mouth every evening. 06/03/15  Yes Historical Provider, MD  prochlorperazine (COMPAZINE) 5 MG tablet Take 1 tablet (5 mg total) by mouth every 6 (six) hours as needed for nausea or vomiting. 11/03/16  Yes Ladell Pier, MD  traMADol (ULTRAM) 50 MG tablet Take 0.5-1 tablets (25-50 mg total) by mouth every 8 (eight) hours as needed. 12/03/16  Yes Owens Shark, NP  feeding supplement (BOOST / RESOURCE BREEZE) LIQD Take 1 Container by mouth 3 (three) times daily between meals. Patient not taking: Reported on 12/06/2016  11/27/16   Mariel Aloe, MD    Physical Exam: Vitals:   12/06/16 1730 12/06/16 1744 12/06/16 1800 12/06/16 1815  BP: 167/84  (!) 152/104   Pulse: 63 (!) 35 61 (!) 57  Resp: _0 Temp:      TempSrc:      SpO2: 100% 100% 99% 100%      Constitutional: NAD, calm, comfortable Vitals:   12/06/16 1730 12/06/16 1744 12/06/16 1800 12/06/16 1815  BP: 167/84  (!) 152/104   Pulse: 63 (!) 35 61 (!) 57  Resp: _1 Temp:      TempSrc:      SpO2: 100% 100% 99% 100%   Eyes: PERRL,  ENMT: Mucous membranes are Dry.   Neck: normal, supple Respiratory: clear to auscultation bilaterally, no wheezing, no crackles. Normal respiratory effort. No accessory  muscle use.  Cardiovascular: Regular rate and rhythm, no murmurs / rubs / gallops. No extremity edema. 2+ pedal pulses. Abdomen: Soft, no tenderness. No hepatosplenomegaly. Bowel sounds positive.  Musculoskeletal: no clubbing / cyanosis. No joint deformity upper and lower extremities.  Skin: no rashes, lesions, ulcers. No induration Neurologic: CN 2-12 grossly intact. Sensation intact, Strength 5/5 in all 4.  Psychiatric: Normal judgment and insight. Alert and oriented x 3. Normal mood.    Labs on Admission: I have personally reviewed following labs and imaging studies  CBC:  Recent Labs Lab 12/03/16 1145 12/06/16 1615  WBC 4.1 5.1  NEUTROABS 3.5  --   HGB 14.3 15.2*  HCT 42.2 46.3*  MCV 92.3 91.5  PLT 376 382   Basic Metabolic Panel:  Recent Labs Lab 12/03/16 1145 12/06/16 1615  NA 139 139  K 3.9 3.9  CL  --  103  CO2 21* 24  GLUCOSE 123 119*  BUN 13.6 9  CREATININE 1.2* 1.06*  CALCIUM 9.9 10.0   GFR: Estimated Creatinine Clearance: 45.3 mL/min (by C-G formula based on SCr of 1.06 mg/dL (H)). Liver Function Tests:  Recent Labs Lab 12/03/16 1145  AST 17  ALT 16  ALKPHOS 78  BILITOT 0.66  PROT 7.2  ALBUMIN 4.4   No results for input(s): LIPASE, AMYLASE in the last 168 hours. No results for  input(s): AMMONIA in the last 168 hours. Coagulation Profile: No results for input(s): INR, PROTIME in the last 168 hours. Cardiac Enzymes: No results for input(s): CKTOTAL, CKMB, CKMBINDEX, TROPONINI in the last 168 hours. BNP (last 3 results) No results for input(s): PROBNP in the last 8760 hours. HbA1C: No results for input(s): HGBA1C in the last 72 hours. CBG: No results for input(s): GLUCAP in the last 168 hours. Lipid Profile: No results for input(s): CHOL, HDL, LDLCALC, TRIG, CHOLHDL, LDLDIRECT in the last 72 hours. Thyroid Function Tests: No results for input(s): TSH, T4TOTAL, FREET4, T3FREE, THYROIDAB in the last 72 hours. Anemia Panel: No results for input(s): VITAMINB12, FOLATE, FERRITIN, TIBC, IRON, RETICCTPCT in the last 72 hours. Urine analysis:    Component Value Date/Time   COLORURINE YELLOW 11/23/2016 [place] 11/23/2016 0954   LABSPEC 1.009 11/23/2016 0954   LABSPEC 1.010 10/30/2016 1522   PHURINE 8.0 11/23/2016 0954   GLUCOSEU NEGATIVE 11/23/2016 0954   GLUCOSEU Negative 10/30/2016 1522   HGBUR NEGATIVE 11/23/2016 0954   BILIRUBINUR NEGATIVE 11/23/2016 0954   BILIRUBINUR Negative 10/30/2016 1522   KETONESUR 5 (A) 11/23/2016 0954   PROTEINUR 100 (A) 11/23/2016 0954   UROBILINOGEN 0.2 10/30/2016 1522   NITRITE NEGATIVE 11/23/2016 0954   LEUKOCYTESUR NEGATIVE 11/23/2016 0954   LEUKOCYTESUR Trace 10/30/2016 1522   Sepsis Labs: !!!!!!!!!!!!!!!!!!!!!!!!!!!!!!!!!!!!!!!!!!!! _0 (procalcitonin:4,lacticidven:4) )No results found for this or any previous visit (from the past 240 hour(s)).   Radiological Exams on Admission: No results found.  EKG: Independently reviewed. Sinus rhythm with atrial premature complexes.  Assessment/Plan Active Problems:   Syncope and collapse  #Syncope and collapse: Most likely related with dehydration in the setting of diarrhea caused by stool softeners. Patient has no sign of infection and has no focal  neurological deficit. Patient has no fever and has no leukocytosis. BMP normal. She reported having 2 episodes of syncope today and has history of myeloma. -I will check head CT scan, chest x-ray, urinalysis, TSH level, orthostatic blood pressure in the morning. -Orthostatic blood pressure normal in the ER. She received a liter of fluid in the Er. -I will  continue IV fluid. -Repeat troponin, first set of troponin negative. -Patient had echocardiogram on August 2017 which was consistent with normal left ventricular systolic function, grade 2 diastolic dysfunction and moderate to severe pulmonary hypertension. -PT, OT evaluation -Follow-up pending test results.  #brief periods of tachycardia in the ER: As per discussion with the ER physician, the heart rate of 35 record in the computer most likely is not true heart rate. EKG with sinus rhythm. During my examination the patient's heart rate mostly around 70 to 80s. -I will continue to monitor in telemetry tonight, repeat troponin. Depending on tonight's observation and clinical improvement may consider cardiology consult in the morning. -Blood pressure acceptable.  #Hypertension: Continue amlodipine, Imdur. Monitor blood pressure.  #History of coronary artery disease status post stent: Continue aspirin, statin, Imdur. No chest pain. Continue telemetry monitor and repeat troponin.  #History of multiple myeloma on chemotherapy outpatient: Recommended to follow up with oncologist after discharge.  #Gastritis and history of acid reflux: No active issues now. Continue Protonix. Cardiac diet ordered.  #History of anxiety depression: Mood is stable today. Continue home medications.  Continue to provide supportive care, feeding supplement.   DVT prophylaxis: Lovenox sq Code Status: Full code Family Communication: Patient's sister at bedside in the ER Disposition Plan: Admission Consults called: None Admission status: Observation   Sharlotte Baka Tanna Furry MD [facility] Pager [phone]  If 7PM-7AM, please contact night-coverage [email or web address] Password [facility]  12/06/2016, 6:25 PM

## 2016-12-06 NOTE — ED Notes (Signed)
Phlebotomy aware unable to obtain labs, states will go get.

## 2016-12-06 NOTE — ED Notes (Signed)
attempted report 

## 2016-12-06 NOTE — Progress Notes (Signed)
CRITICAL VALUE ALERT  Critical value received:  Troponin 0.08  Date of notification:  12/06/16  Time of notification:  2120  Critical value read back:Yes.    Nurse who received alert:  [place]   MD notified (1st page):  Maudie Mercury  Time of first page:  2123  MD notified (2nd page):  Time of second page:  Responding MD:  Maudie Mercury  Time MD responded:  2126

## 2016-12-06 NOTE — ED Triage Notes (Signed)
Patient presents today from home states she started having some weakness. Patient states has LOC x2. Patient states she does not think she hit her head. Patient denies any Chest Pain or SOB. Patient denies any N?V. Patient alert and oriented responding to question, and following commands. Per EMS patient HR 60-140. Denies any hx of A-fib.

## 2016-12-06 NOTE — ED Notes (Signed)
Spoke with Admitting MD Carolin Sicks advised patient is stable  to go to 2W at this time.

## 2016-12-06 NOTE — ED Notes (Signed)
MD at bedside,

## 2016-12-07 ENCOUNTER — Observation Stay (HOSPITAL_BASED_OUTPATIENT_CLINIC_OR_DEPARTMENT_OTHER): Payer: Medicare Other

## 2016-12-07 ENCOUNTER — Encounter (HOSPITAL_COMMUNITY): Payer: Self-pay

## 2016-12-07 ENCOUNTER — Other Ambulatory Visit: Payer: Self-pay | Admitting: Oncology

## 2016-12-07 ENCOUNTER — Telehealth: Payer: Self-pay

## 2016-12-07 DIAGNOSIS — E860 Dehydration: Secondary | ICD-10-CM | POA: Diagnosis not present

## 2016-12-07 DIAGNOSIS — R7989 Other specified abnormal findings of blood chemistry: Secondary | ICD-10-CM

## 2016-12-07 DIAGNOSIS — I495 Sick sinus syndrome: Secondary | ICD-10-CM | POA: Diagnosis not present

## 2016-12-07 DIAGNOSIS — I951 Orthostatic hypotension: Secondary | ICD-10-CM | POA: Diagnosis not present

## 2016-12-07 DIAGNOSIS — C9000 Multiple myeloma not having achieved remission: Secondary | ICD-10-CM | POA: Diagnosis not present

## 2016-12-07 DIAGNOSIS — R55 Syncope and collapse: Secondary | ICD-10-CM | POA: Diagnosis not present

## 2016-12-07 LAB — ECHOCARDIOGRAM COMPLETE
CHL CUP DOP CALC LVOT VTI: 29.5 cm
CHL CUP MV DEC (S): 278
CHL CUP STROKE VOLUME: 31 mL
E decel time: 278 msec
E/e' ratio: 7.55
FS: 31 % (ref 28–44)
Height: 65 in
IV/PV OW: 1.18
LA diam end sys: 39 mm
LA vol: 54.2 mL
LADIAMINDEX: 2.23 cm/m2
LASIZE: 39 mm
LAVOLA4C: 48.8 mL
LAVOLIN: 31.1 mL/m2
LV E/e'average: 7.55
LV TDI E'LATERAL: 9.25
LV dias vol index: 29 mL/m2
LV sys vol index: 12 mL/m2
LV sys vol: 21 mL (ref 14–42)
LVDIAVOL: 51 mL (ref 46–106)
LVEEMED: 7.55
LVELAT: 9.25 cm/s
LVOT area: 2.84 cm2
LVOT peak grad rest: 8 mmHg
LVOTD: 19 mm
LVOTPV: 143 cm/s
LVOTSV: 84 mL
Lateral S' vel: 16.5 cm/s
MV pk E vel: 69.8 m/s
MVPKAVEL: 104 m/s
PW: 11.1 mm — AB (ref 0.6–1.1)
RV sys press: 37 mmHg
Reg peak vel: 290 cm/s
Simpson's disk: 60
TAPSE: 20.4 mm
TDI e' medial: 5.44
TR max vel: 290 cm/s
Weight: 2320 oz

## 2016-12-07 LAB — PROTEIN ELECTROPHORESIS, SERUM
A/G Ratio: 1.4 (ref 0.7–1.7)
ALPHA 1: 0.3 g/dL (ref 0.0–0.4)
ALPHA 2: 0.9 g/dL (ref 0.4–1.0)
Albumin: 4 g/dL (ref 2.9–4.4)
Beta: 1 g/dL (ref 0.7–1.3)
GAMMA GLOBULIN: 0.6 g/dL (ref 0.4–1.8)
Globulin, Total: 2.8 g/dL (ref 2.2–3.9)
M-Spike, %: 0.4 g/dL — ABNORMAL HIGH
TOTAL PROTEIN: 6.8 g/dL (ref 6.0–8.5)

## 2016-12-07 LAB — LIPID PANEL
Cholesterol: 220 mg/dL — ABNORMAL HIGH (ref 0–200)
HDL: 70 mg/dL (ref >40)
LDL CALC: 111 mg/dL — AB (ref 0–99)
Total CHOL/HDL Ratio: 3.1 RATIO
Triglycerides: 197 mg/dL — ABNORMAL HIGH (ref <150)
VLDL: 39 mg/dL (ref 0–40)

## 2016-12-07 LAB — CBC
HEMATOCRIT: 43.8 % (ref 36.0–46.0)
HEMOGLOBIN: 14.1 g/dL (ref 12.0–15.0)
MCH: 29.6 pg (ref 26.0–34.0)
MCHC: 32.2 g/dL (ref 30.0–36.0)
MCV: 91.8 fL (ref 78.0–100.0)
Platelets: 243 10*3/uL (ref 150–400)
RBC: 4.77 MIL/uL (ref 3.87–5.11)
RDW: 15.9 % — ABNORMAL HIGH (ref 11.5–15.5)
WBC: 4.5 10*3/uL (ref 4.0–10.5)

## 2016-12-07 LAB — BASIC METABOLIC PANEL
Anion gap: 11 (ref 5–15)
BUN: 8 mg/dL (ref 6–20)
CHLORIDE: 109 mmol/L (ref 101–111)
CO2: 21 mmol/L — AB (ref 22–32)
CREATININE: 0.81 mg/dL (ref 0.44–1.00)
Calcium: 9.1 mg/dL (ref 8.9–10.3)
GFR calc Af Amer: >60 mL/min (ref >60)
GFR calc non Af Amer: >60 mL/min (ref >60)
Glucose, Bld: 96 mg/dL (ref 65–99)
Potassium: 3.7 mmol/L (ref 3.5–5.1)
Sodium: 141 mmol/L (ref 135–145)

## 2016-12-07 LAB — GLUCOSE, CAPILLARY
GLUCOSE-CAPILLARY: 81 mg/dL (ref 65–99)
Glucose-Capillary: 108 mg/dL — ABNORMAL HIGH (ref 65–99)

## 2016-12-07 LAB — TROPONIN I
Troponin I: 0.09 ng/mL (ref <0.03)
Troponin I: 0.08 ng/mL (ref <0.03)

## 2016-12-07 MED ORDER — POLYETHYLENE GLYCOL 3350 17 G PO PACK
17.0000 g | PACK | ORAL | Status: DC
  Administered 2016-12-07: 17 g via ORAL
  Filled 2016-12-07 (×2): qty 1

## 2016-12-07 MED ORDER — METOPROLOL TARTRATE 12.5 MG HALF TABLET
12.5000 mg | ORAL_TABLET | Freq: Two times a day (BID) | ORAL | Status: DC
  Administered 2016-12-07 – 2016-12-09 (×5): 12.5 mg via ORAL
  Filled 2016-12-07 (×5): qty 1

## 2016-12-07 MED ORDER — KETOROLAC TROMETHAMINE 30 MG/ML IJ SOLN
30.0000 mg | Freq: Three times a day (TID) | INTRAMUSCULAR | Status: DC
  Administered 2016-12-07 – 2016-12-09 (×5): 30 mg via INTRAVENOUS
  Filled 2016-12-07 (×5): qty 1

## 2016-12-07 MED ORDER — POLYETHYLENE GLYCOL 3350 17 G PO PACK: 17.0000 g | PACK | Freq: Every day | ORAL | Status: DC | PRN

## 2016-12-07 MED ORDER — KETOROLAC TROMETHAMINE 15 MG/ML IJ SOLN
15.0000 mg | Freq: Three times a day (TID) | INTRAMUSCULAR | Status: DC | PRN
  Administered 2016-12-07: 15 mg via INTRAVENOUS
  Filled 2016-12-07: qty 1

## 2016-12-07 MED ORDER — DULOXETINE HCL 20 MG PO CPEP
20.0000 mg | ORAL_CAPSULE | Freq: Every day | ORAL | Status: DC
  Administered 2016-12-07 – 2016-12-09 (×3): 20 mg via ORAL
  Filled 2016-12-07 (×3): qty 1

## 2016-12-07 MED ORDER — AMLODIPINE BESYLATE 5 MG PO TABS: 5.0000 mg | ORAL_TABLET | Freq: Every day | ORAL | Status: DC

## 2016-12-07 MED ORDER — ISOSORBIDE MONONITRATE ER 30 MG PO TB24
30.0000 mg | ORAL_TABLET | Freq: Every day | ORAL | Status: DC
  Administered 2016-12-07: 30 mg via ORAL
  Filled 2016-12-07: qty 1

## 2016-12-07 MED ORDER — POTASSIUM CHLORIDE IN NACL 20-0.9 MEQ/L-% IV SOLN
INTRAVENOUS | Status: DC
  Administered 2016-12-07 – 2016-12-09 (×4): via INTRAVENOUS
  Filled 2016-12-07 (×4): qty 1000

## 2016-12-07 NOTE — Progress Notes (Signed)
*  PRELIMINARY RESULTS* Echocardiogram 2D Echocardiogram has been performed.  Samuel Germany 12/07/2016, 12:59 PM

## 2016-12-07 NOTE — Evaluation (Addendum)
Physical Therapy Evaluation Patient Details Name: Abigail [name] MRN: [id number] DOB: 11/29/1945 Today's Date: 12/07/2016   History of Present Illness  71 yo female admitted with LOC x2. Hx of multiple myeloma-undergoing chemo, MI, CAD, Anemia, renal stenosis, depression, GERD, HLD, HTN MI.   Clinical Impression  Pt presents with the above diagnosis and below deficits. Prior to admission, pt was living alone in a single level home receiving assistance PRN from family. Pt instructed on the importance of using RW for mobility once returning home in order to maximize her functional outcomes. Pt will benefit from continued acute PT services to address below deficits in order to assist with a smooth transition home.     Follow Up Recommendations No PT follow up    Equipment Recommendations  None recommended by PT    Recommendations for Other Services       Precautions / Restrictions Precautions Precautions: Fall Restrictions Weight Bearing Restrictions: No      Mobility  Bed Mobility Overal bed mobility: Modified Independent             General bed mobility comments: Use of railings to get EOB  Transfers Overall transfer level: Modified independent Equipment used: Rolling walker (2 wheeled)             General transfer comment: Able to stand without hands on assistance from EOB  Ambulation/Gait Ambulation/Gait assistance: Supervision Ambulation Distance (Feet): 350 Feet Assistive device: Rolling walker (2 wheeled) Gait Pattern/deviations: Step-through pattern;Decreased stride length Gait velocity: decreased Gait velocity interpretation: Below normal speed for age/gender General Gait Details: Supervision for safety with RW. Pt instructed to use RW once she returns home on order to   [employer]    Modified Rankin (Stroke Patients Only)       Balance Overall balance assessment: Modified Independent                                            Pertinent Vitals/Pain Pain Assessment: No/denies pain    Home Living Family/patient expects to be discharged to:: Private residence Living Arrangements: Alone Available Help at Discharge: Family;Available PRN/intermittently Type of Home: Apartment Home Access: Stairs to enter Entrance Stairs-Rails: None Entrance Stairs-Number of Steps: 2 Home Layout: One level Home Equipment: Walker - 2 wheels      Prior Function Level of Independence: Independent         Comments: was independent, was able to get out of the house     Hand Dominance   Dominant Hand: Right    Extremity/Trunk Assessment   Upper Extremity Assessment Upper Extremity Assessment: Defer to OT evaluation    Lower Extremity Assessment Lower Extremity Assessment: Generalized weakness    Cervical / Trunk Assessment Cervical / Trunk Assessment: Normal  Communication   Communication: No difficulties  Cognition Arousal/Alertness: Awake/alert Behavior During Therapy: WFL for tasks assessed/performed Overall Cognitive Status: Within Functional Limits for tasks assessed                      General Comments      Exercises     Assessment/Plan    PT Assessment Patient needs continued PT services  PT Problem List Decreased activity tolerance;Decreased balance;Decreased mobility;Decreased safety awareness          PT Treatment Interventions  PT Goals (Current goals can be found in the Care Plan section)  Acute Rehab PT Goals Patient Stated Goal: to get back home  PT Goal Formulation: With patient Time For Goal Achievement: 12/14/16 Potential to Achieve Goals: Good    Frequency     Barriers to discharge        Co-evaluation               End of Session Equipment Utilized During Treatment: Gait belt Activity Tolerance: Patient tolerated treatment well Patient left: in bed;with call bell/phone within reach Nurse Communication: Mobility  status    Functional Assessment Tool Used: clinical judgement Functional Limitation: Mobility: Walking and moving around Mobility: Walking and Moving Around Current Status (S1779): At least 1 percent but less than 20 percent impaired, limited or restricted Mobility: Walking and Moving Around Goal Status [phone]): At least 1 percent but less than 20 percent impaired, limited or restricted Mobility: Walking and Moving Around Discharge Status [phone]): At least 1 percent but less than 20 percent impaired, limited or restricted    Time: 0922-0953 PT Time Calculation (min) (ACUTE ONLY): 31 min   Charges:   PT Evaluation $PT Eval Moderate Complexity: 1 Procedure PT Treatments $Gait Training: 8-22 mins   PT G Codes:   PT G-Codes **NOT FOR INPATIENT CLASS** Functional Assessment Tool Used: clinical judgement Functional Limitation: Mobility: Walking and moving around Mobility: Walking and Moving Around Current Status (A0762): At least 1 percent but less than 20 percent impaired, limited or restricted Mobility: Walking and Moving Around Goal Status [phone]): At least 1 percent but less than 20 percent impaired, limited or restricted Mobility: Walking and Moving Around Discharge Status [phone]): At least 1 percent but less than 20 percent impaired, limited or restricted    Scheryl Marten PT, DPT  [phone]  12/07/2016, 2:40 PM

## 2016-12-07 NOTE — Telephone Encounter (Signed)
Left a message on sisters voicemail with appt as the patient has no vm   Abigail [name]

## 2016-12-07 NOTE — Consult Note (Signed)
Reason for Consult: Recurrent syncope Referring Physician: [facility]  Abigail [name] is an 71 y.o. female.  HPI: Patient is 71 year old female with past medical history significant for multiple medical problems i.e. coronary artery disease history of MI and remote past status post PCI to left circumflex in 1999, history of bilateral renal artery stenosis requiring bilateral stenting in 2005, hypertension, hyperlipidemia, history of GERD/gastritis and gastric ulcer in the past, history of multiple myeloma, history of diverticulosis, was admitted yesterday because of recurrent syncopal episode. Patient states yesterday morning while going to the bathroom she had sudden episode of syncope. Patient denies any palpitations prior to the syncopal episode but felt weak and lightheaded. Denies any chest pain shortness of breath nausea vomiting or diaphoresis. Denies any seizure activity. Denies any weakness in the arms or legs states had similar episode approximately 2 hours later so decided to call EMS and came to ER. As per chart review patient was noted to have brief episode of bradycardia with heart rate of 35 and had multiple episodes of SVT/atrial tachycardia on the monitor here. Patient denies any episodes of palpitation during these episodes. Patient was also noted to have minimally elevated troponin I states had recently stress test at  Dr. Merrilee Jansky office which was normal.  Past Medical History:  Diagnosis Date  . Anemia 07/2015  . Bilateral renal artery stenosis ([place]) 1999   s/p stenting  . Coronary artery disease 1999  . Depression   . Diverticulosis   . Duodenitis   . Gastric erosions   . Gastric ulcer   . GERD (gastroesophageal reflux disease)   . Heart murmur   . History of blood transfusion ~ 03/2016   "low HgB; practically nonexistent"  . Hyperlipidemia   . Hypertension   . MI (myocardial infarction) 1999  . Multiple myeloma ([place])   . Retinal hemorrhage, right eye   . Schatzki's  ring   . Tubular adenoma of colon     Past Surgical History:  Procedure Laterality Date  . APPENDECTOMY    . BACK SURGERY    . CATARACT EXTRACTION W/ INTRAOCULAR LENS IMPLANT Right 2014  . CORONARY ANGIOPLASTY WITH STENT PLACEMENT  1999  . ESOPHAGOGASTRODUODENOSCOPY N/A 11/03/2015   Procedure: ESOPHAGOGASTRODUODENOSCOPY (EGD);  Surgeon: Carol Ada, MD;  Location: [facility] ENDOSCOPY;  Service: Endoscopy;  Laterality: N/A;  . ESOPHAGOGASTRODUODENOSCOPY (EGD) WITH PROPOFOL N/A 11/18/2016   Procedure: ESOPHAGOGASTRODUODENOSCOPY (EGD) WITH PROPOFOL;  Surgeon: Ladene Artist, MD;  Location: [facility] ENDOSCOPY;  Service: Endoscopy;  Laterality: N/A;  . LUMBAR [place]    . RENAL ARTERY STENT  1999   bil RAS so ? bil vs unilateral stents  . RETINAL LASER PROCEDURE Right 2012   "bleeding"  . TONSILLECTOMY    . TOTAL ABDOMINAL HYSTERECTOMY      Family History  Problem Relation Age of Onset  . Colon cancer Neg Hx     Social History:  reports that she has been smoking Cigarettes.  She has a 24.00 pack-year smoking history. She has never used smokeless tobacco. She reports that she does not drink alcohol or use drugs.  Allergies:  Allergies  Allergen Reactions  . Sulfa Antibiotics Rash    Medications: I have reviewed the patient's current medications.  Results for orders placed or performed during the hospital encounter of 12/06/16 (from the past 48 hour(s))  Basic metabolic panel     Status: Abnormal   Collection Time: 12/06/16  4:15 PM  Result Value Ref Range   Sodium  139 135 - 145 mmol/L   Potassium 3.9 3.5 - 5.1 mmol/L   Chloride 103 101 - 111 mmol/L   CO2 24 22 - 32 mmol/L   Glucose, Bld 119 (H) 65 - 99 mg/dL   BUN 9 6 - 20 mg/dL   Creatinine, Ser 1.06 (H) 0.44 - 1.00 mg/dL   Calcium 10.0 8.9 - 10.3 mg/dL   GFR calc non Af Amer 52 (L) >60 mL/min   GFR calc Af Amer >60 >60 mL/min    Comment: (NOTE) The eGFR has been calculated using the CKD EPI equation. This calculation has not  been validated in all clinical situations. eGFR's persistently <60 mL/min signify possible Chronic Kidney Disease.    Anion gap 12 5 - 15  CBC     Status: Abnormal   Collection Time: 12/06/16  4:15 PM  Result Value Ref Range   WBC 5.1 4.0 - 10.5 K/uL   RBC 5.06 3.87 - 5.11 MIL/uL   Hemoglobin 15.2 (H) 12.0 - 15.0 g/dL   HCT 46.3 (H) 36.0 - 46.0 %   MCV 91.5 78.0 - 100.0 fL   MCH 30.0 26.0 - 34.0 pg   MCHC 32.8 30.0 - 36.0 g/dL   RDW 15.8 (H) 11.5 - 15.5 %   Platelets 273 150 - 400 K/uL  I-stat troponin, ED     Status: None   Collection Time: 12/06/16  4:15 PM  Result Value Ref Range   Troponin i, poc 0.02 0.00 - 0.08 ng/mL   Comment 3            Comment: Due to the release kinetics of cTnI, a negative result within the first hours of the onset of symptoms does not rule out myocardial infarction with certainty. If myocardial infarction is still suspected, repeat the test at appropriate intervals.   D-dimer, quantitative (not at [facility])     Status: None   Collection Time: 12/06/16  5:05 PM  Result Value Ref Range   D-Dimer, Quant 0.38 0.00 - 0.50 ug/mL-FEU    Comment: (NOTE) At the manufacturer cut-off of 0.50 ug/mL FEU, this assay has been documented to exclude PE with a sensitivity and negative predictive value of 97 to 99%.  At this time, this assay has not been approved by the FDA to exclude DVT/VTE. Results should be correlated with clinical presentation.   TSH     Status: None   Collection Time: 12/06/16  8:05 PM  Result Value Ref Range   TSH 0.541 0.350 - 4.500 uIU/mL    Comment: Performed by a 3rd Generation assay with a functional sensitivity of <=0.01 uIU/mL.  Troponin I     Status: Abnormal   Collection Time: 12/06/16  8:05 PM  Result Value Ref Range   Troponin I 0.08 (HH) <0.03 ng/mL    Comment: CRITICAL RESULT CALLED TO, READ BACK BY AND VERIFIED WITH: MAY B,RN 12/06/16 2113 WAYK   Urinalysis, Routine w reflex microscopic     Status: Abnormal   Collection  Time: 12/06/16  9:47 PM  Result Value Ref Range   Color, Urine YELLOW YELLOW   APPearance CLEAR CLEAR   Specific Gravity, Urine 1.008 1.005 - 1.030   pH 5.0 5.0 - 8.0   Glucose, UA NEGATIVE NEGATIVE mg/dL   Hgb urine dipstick NEGATIVE NEGATIVE   Bilirubin Urine NEGATIVE NEGATIVE   Ketones, ur 5 (A) NEGATIVE mg/dL   Protein, ur NEGATIVE NEGATIVE mg/dL   Nitrite NEGATIVE NEGATIVE   Leukocytes, UA NEGATIVE NEGATIVE  Troponin I     Status: Abnormal   Collection Time: 12/07/16  2:26 AM  Result Value Ref Range   Troponin I 0.09 (HH) <0.03 ng/mL    Comment: CRITICAL VALUE NOTED.  VALUE IS CONSISTENT WITH PREVIOUSLY REPORTED AND CALLED VALUE.  CBC     Status: Abnormal   Collection Time: 12/07/16  6:05 AM  Result Value Ref Range   WBC 4.5 4.0 - 10.5 K/uL   RBC 4.77 3.87 - 5.11 MIL/uL   Hemoglobin 14.1 12.0 - 15.0 g/dL   HCT 43.8 36.0 - 46.0 %   MCV 91.8 78.0 - 100.0 fL   MCH 29.6 26.0 - 34.0 pg   MCHC 32.2 30.0 - 36.0 g/dL   RDW 15.9 (H) 11.5 - 15.5 %   Platelets 243 150 - 400 K/uL  Basic metabolic panel     Status: Abnormal   Collection Time: 12/07/16  6:05 AM  Result Value Ref Range   Sodium 141 135 - 145 mmol/L   Potassium 3.7 3.5 - 5.1 mmol/L   Chloride 109 101 - 111 mmol/L   CO2 21 (L) 22 - 32 mmol/L   Glucose, Bld 96 65 - 99 mg/dL   BUN 8 6 - 20 mg/dL   Creatinine, Ser 0.81 0.44 - 1.00 mg/dL   Calcium 9.1 8.9 - 10.3 mg/dL   GFR calc non Af Amer >60 >60 mL/min   GFR calc Af Amer >60 >60 mL/min    Comment: (NOTE) The eGFR has been calculated using the CKD EPI equation. This calculation has not been validated in all clinical situations. eGFR's persistently <60 mL/min signify possible Chronic Kidney Disease.    Anion gap 11 5 - 15  Troponin I     Status: Abnormal   Collection Time: 12/07/16  6:05 AM  Result Value Ref Range   Troponin I 0.08 (HH) <0.03 ng/mL    Comment: CRITICAL VALUE NOTED.  VALUE IS CONSISTENT WITH PREVIOUSLY REPORTED AND CALLED VALUE.  Lipid panel      Status: Abnormal   Collection Time: 12/07/16  6:05 AM  Result Value Ref Range   Cholesterol 220 (H) 0 - 200 mg/dL   Triglycerides 197 (H) <150 mg/dL   HDL 70 >40 mg/dL   Total CHOL/HDL Ratio 3.1 RATIO   VLDL 39 0 - 40 mg/dL   LDL Cholesterol 111 (H) 0 - 99 mg/dL    Comment:        Total Cholesterol/HDL:CHD Risk Coronary Heart Disease Risk Table                     Men   Women  1/2 Average Risk   3.4   3.3  Average Risk       5.0   4.4  2 X Average Risk   9.6   7.1  3 X Average Risk  23.4   11.0        Use the calculated Patient Ratio above and the CHD Risk Table to determine the patient's CHD Risk.        ATP III CLASSIFICATION (LDL):  <100     mg/dL   Optimal  100-129  mg/dL   Near or Above                    Optimal  130-159  mg/dL   Borderline  160-189  mg/dL   High  >190     mg/dL   Very High   Glucose, capillary  Status: None   Collection Time: 12/07/16  6:47 AM  Result Value Ref Range   Glucose-Capillary 81 65 - 99 mg/dL  Glucose, capillary     Status: Abnormal   Collection Time: 12/07/16 11:30 AM  Result Value Ref Range   Glucose-Capillary 108 (H) 65 - 99 mg/dL    X-ray Chest Pa And Lateral  Result Date: 12/07/2016 CLINICAL DATA:  Syncope EXAM: CHEST  2 VIEW COMPARISON:  Chest CT from 12/31/2011, CXR 11/14/2016 FINDINGS: The heart is normal in size. There is aortic atherosclerosis without aneurysm. Right paratracheal soft tissue prominence is consistent with tortuous vessels based on prior chest CT. The lungs are clear. No acute osseous abnormality is noted. IMPRESSION: No active cardiopulmonary disease. Electronically Signed   By: Ashley Royalty M.D.   On: 12/07/2016 01:32   Ct Head Wo Contrast  Result Date: 12/07/2016 CLINICAL DATA:  Syncope x2, multiple myeloma, hypertension, hyperlipidemia. EXAM: CT HEAD WITHOUT CONTRAST TECHNIQUE: Contiguous axial images were obtained from the base of the skull through the vertex without intravenous contrast. COMPARISON:   None. FINDINGS: BRAIN: Mild superficial atrophy with minimal chronic appearing small vessel ischemic disease of periventricular white matter. No acute intracranial hemorrhage, intra-axial mass nor extra-axial fluid. No acute large vascular territory infarction. VASCULAR: Moderate calcific atherosclerosis of the carotid siphons. SKULL: No skull fracture. Mottled lytic appearance of the bony calvarium in keeping with multiple myeloma. No significant scalp soft tissue swelling. SINUSES/ORBITS: The mastoid air-cells are clear. The included paranasal sinuses are well-aerated.The included ocular globes and orbital contents are non-suspicious. OTHER: None. IMPRESSION: Cerebral atrophy with chronic appearing small vessel ischemic disease of periventricular white matter. No acute intracranial abnormality. Mottled lytic appearance of the bony calvarium consistent with the patient's history of multiple myeloma. Electronically Signed   By: Ashley Royalty M.D.   On: 12/07/2016 01:29    Review of Systems  Constitutional: Negative for chills and fever.  Respiratory: Negative for cough and shortness of breath.   Cardiovascular: Negative for chest pain, palpitations, orthopnea and leg swelling.  Gastrointestinal: Negative for nausea and vomiting.  Genitourinary: Negative for dysuria.  Neurological: Positive for dizziness.   Blood pressure 129/80, pulse (!) 56, temperature 98.2 F (36.8 C), temperature source Oral, resp. rate 18, height _0  (1.651 m), weight 145 lb (65.8 kg), SpO2 100 %. Physical Exam  Constitutional: She is oriented to person, place, and time.  Eyes: Conjunctivae are normal. Pupils are equal, round, and reactive to light. Left eye exhibits no discharge. No scleral icterus.  Neck: Normal range of motion. Neck supple. No JVD present. No tracheal deviation present. No thyromegaly present.  Cardiovascular: Normal rate and regular rhythm.   Murmur (Soft systolic murmur noted no S3 gallop)  heard. Respiratory: Effort normal and breath sounds normal. No respiratory distress. She has no wheezes. She has no rales.  GI: Soft. Bowel sounds are normal. She exhibits no distension. There is no tenderness. There is no guarding.  Musculoskeletal: She exhibits edema. She exhibits no tenderness or deformity.  Neurological: She is alert and oriented to person, place, and time.    Assessment/Plan: Status post syncope 2. 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 Check TSH Check old records  Charolette Forward 12/07/2016, 12:12 PM

## 2016-12-07 NOTE — Care Management Obs Status (Signed)
[place] NOTIFICATION   Patient Details  Name: KARINE KOZLOW MRN: [id number] Date of Birth: 05-01-1946   Medicare Observation Status Notification Given:  Yes    Dawayne Patricia, RN 12/07/2016, 2:12 PM

## 2016-12-07 NOTE — Progress Notes (Addendum)
[facility] Progress Note  Patient: Abigail [name] YSA:[id number]   PCP: Philis Fendt, MD DOB: June 15, 1946   DOA: 12/06/2016   DOS: 12/07/2016   Date of Service: the patient was seen and examined on 12/07/2016   Subjective: feeling better, no acute complains, no fever, no dizziness, no diarrhea here, but had more then 10 BM at home.   Brief hospital course: Pt. with PMH of CAD, Multiple Myeloma, GERD, renal art stenosis, HTN, HLD; admitted on 12/06/2016, with complaint of syncope, was found to have tachy-brady syndrome and elevated troponin. Currently further plan is further cardiac work up.  Assessment and Plan: 1. Syncope and collapse: Tachy-brady syndrome. ? MAT ? SVT Dehydration in the setting of diarrhea caused by stool softeners. Negative head CT scan, chest x-ray, urinalysis, TSH level -Orthostatic blood pressure normal in the ER. Recheck requested to RN, will monitor. -I will stop IV fluid. Use only if BP lower on 3 minute orthostasis. - tele shows tachy-brady syndrome, unclear of its association with pt's presentation. Cardiology was consulted and they have consulted EP. Pt started on lopressor. -PT, OT evaluation - Echocardiogram does not show any WMA. EF normal  2. Hypertension: Continue amlodipine, Imdur. Dose reduce in half due to marginally positive orthostasis. Lopressor added due to SVT  3. History of coronary artery disease status post PCI:  Continue aspirin, statin, Imdur. No chest pain. Continue telemetry monitor. Troponin trend does not suggest any ACS. Echocardiogram also unremarkable.  4 History of multiple myeloma on chemotherapy outpatient:  Recommended to follow up with oncologist after discharge.  5 Gastritis and history of acid reflux:  No active issues now. Continue Protonix. Cardiac diet ordered.  6 History of anxiety depression:  Mood is stable today. Continue home medications.  7. Left lower quadrant pain Extensive workup including  multiple x-rays, MRI abdomen with and without contrast, 2 CAT scans, gastric emptying panel, HIDA scan has been negative Patient has received often on narcotic pain medications from ER and only found to be having constipation on the imaging. Avoid narcotics. IV toradol PRN. Resume miralax. initiate a trial of cymbalta.   Bowel regimen: last BM 12/06/2016 Diet: cardiac diet DVT Prophylaxis: subcutaneous Heparin  Advance goals of care discussion: full code  Family Communication: no family was present at bedside, at the time of interview.  Disposition:  Discharge to home. Expected discharge date: 12/08/2016, depending on cardiology work up  Consultants: cardiology, EP Procedures: echocardiogram  Antibiotics: Anti-infectives    Start     Dose/Rate Route Frequency Ordered Stop   12/06/16 2200  acyclovir (ZOVIRAX) tablet 400 mg     400 mg Oral 2 times daily 12/06/16 1956        Objective: Physical Exam: Vitals:   12/06/16 1858 12/06/16 1946 12/06/16 2351 12/07/16 0512  BP:  (!) 166/96 (!) 177/75 129/80  Pulse: 76 (!) 57 64 (!) 56  Resp: '19 18 18 18  ' Temp:  99.4 F (37.4 C) 98.7 F (37.1 C) 98.2 F (36.8 C)  TempSrc:  Oral Oral Oral  SpO2: 99% 100% 97% 100%  Weight:  64.6 kg (142 lb 8 oz)  65.8 kg (145 lb)  Height:  '5\' 5"'  (1.651 m)      Intake/Output Summary (Last 24 hours) at 12/07/16 1356 Last data filed at 12/07/16 0842  Gross per 24 hour  Intake              120 ml  Output  200 ml  Net              -80 ml   Filed Weights   12/06/16 1946 12/07/16 0512  Weight: 64.6 kg (142 lb 8 oz) 65.8 kg (145 lb)   General: Alert, Awake and Oriented to Time, Place and Person. Appear in mild distress, affect appropriate Eyes: PERRL, Conjunctiva normal ENT: Oral Mucosa clear moist. Neck: no JVD, no Abnormal Mass Or lumps Cardiovascular: S1 and S2 Present, no Murmur, Respiratory: Bilateral Air entry equal and Decreased, no use of accessory muscle, Clear to  Auscultation, no Crackles, no wheezes Abdomen: Bowel Sound present, Soft and no tenderness Skin: no redness, no Rash, no induration Extremities: no Pedal edema, no calf tenderness Neurologic: Grossly no focal neuro deficit. Bilaterally Equal motor strength  Data Reviewed: CBC:  Recent Labs Lab 12/03/16 1145 12/06/16 1615 12/07/16 0605  WBC 4.1 5.1 4.5  NEUTROABS 3.5  --   --   HGB 14.3 15.2* 14.1  HCT 42.2 46.3* 43.8  MCV 92.3 91.5 91.8  PLT 376 273 254   Basic Metabolic Panel:  Recent Labs Lab 12/03/16 1145 12/06/16 1615 12/07/16 0605  NA 139 139 141  K 3.9 3.9 3.7  CL  --  103 109  CO2 21* 24 21*  GLUCOSE 123 119* 96  BUN 13.'6 9 8  ' CREATININE 1.2* 1.06* 0.81  CALCIUM 9.9 10.0 9.1    Liver Function Tests:  Recent Labs Lab 12/03/16 1145  AST 17  ALT 16  ALKPHOS 78  BILITOT 0.66  PROT 7.2  6.8  ALBUMIN 4.4   No results for input(s): LIPASE, AMYLASE in the last 168 hours. No results for input(s): AMMONIA in the last 168 hours. Coagulation Profile: No results for input(s): INR, PROTIME in the last 168 hours. Cardiac Enzymes:  Recent Labs Lab 12/06/16 2005 12/07/16 0226 12/07/16 0605  TROPONINI 0.08* 0.09* 0.08*   BNP (last 3 results) No results for input(s): PROBNP in the last 8760 hours.  CBG:  Recent Labs Lab 12/07/16 0647 12/07/16 1130  GLUCAP 81 108*   Studies: X-ray Chest Pa And Lateral  Result Date: 12/07/2016 CLINICAL DATA:  Syncope EXAM: CHEST  2 VIEW COMPARISON:  Chest CT from 12/31/2011, CXR 11/14/2016 FINDINGS: The heart is normal in size. There is aortic atherosclerosis without aneurysm. Right paratracheal soft tissue prominence is consistent with tortuous vessels based on prior chest CT. The lungs are clear. No acute osseous abnormality is noted. IMPRESSION: No active cardiopulmonary disease. Electronically Signed   By: Ashley Royalty M.D.   On: 12/07/2016 01:32   Ct Head Wo Contrast  Result Date: 12/07/2016 CLINICAL DATA:   Syncope x2, multiple myeloma, hypertension, hyperlipidemia. EXAM: CT HEAD WITHOUT CONTRAST TECHNIQUE: Contiguous axial images were obtained from the base of the skull through the vertex without intravenous contrast. COMPARISON:  None. FINDINGS: BRAIN: Mild superficial atrophy with minimal chronic appearing small vessel ischemic disease of periventricular white matter. No acute intracranial hemorrhage, intra-axial mass nor extra-axial fluid. No acute large vascular territory infarction. VASCULAR: Moderate calcific atherosclerosis of the carotid siphons. SKULL: No skull fracture. Mottled lytic appearance of the bony calvarium in keeping with multiple myeloma. No significant scalp soft tissue swelling. SINUSES/ORBITS: The mastoid air-cells are clear. The included paranasal sinuses are well-aerated.The included ocular globes and orbital contents are non-suspicious. OTHER: None. IMPRESSION: Cerebral atrophy with chronic appearing small vessel ischemic disease of periventricular white matter. No acute intracranial abnormality. Mottled lytic appearance of the bony calvarium consistent with the patient's  history of multiple myeloma. Electronically Signed   By: Ashley Royalty M.D.   On: 12/07/2016 01:29    Scheduled Meds: . acyclovir  400 mg Oral BID  . amLODipine  5 mg Oral QHS  . aspirin EC  81 mg Oral Daily  . enoxaparin (LOVENOX) injection  40 mg Subcutaneous Q24H  . feeding supplement (ENSURE ENLIVE)  237 mL Oral BID BM  . FLUoxetine  10 mg Oral QHS  . metoprolol tartrate  12.5 mg Oral BID  . pantoprazole  40 mg Oral BID AC  . pravastatin  40 mg Oral QPM  . sodium chloride flush  3 mL Intravenous Q12H  . vitamin C  1,000 mg Oral Daily   Continuous Infusions:  PRN Meds: acetaminophen **OR** acetaminophen, ondansetron **OR** ondansetron (ZOFRAN) IV, polyethylene glycol, prochlorperazine  Time spent: 30 minutes  Author: Berle Mull, MD [facility] Pager: [phone] 12/07/2016 1:56 PM  If  7PM-7AM, please contact night-coverage at [email or web address], password [facility]

## 2016-12-08 ENCOUNTER — Observation Stay (HOSPITAL_COMMUNITY): Payer: Medicare Other

## 2016-12-08 DIAGNOSIS — I951 Orthostatic hypotension: Secondary | ICD-10-CM | POA: Diagnosis not present

## 2016-12-08 DIAGNOSIS — I495 Sick sinus syndrome: Secondary | ICD-10-CM | POA: Diagnosis not present

## 2016-12-08 DIAGNOSIS — R55 Syncope and collapse: Secondary | ICD-10-CM | POA: Diagnosis present

## 2016-12-08 DIAGNOSIS — F329 Major depressive disorder, single episode, unspecified: Secondary | ICD-10-CM | POA: Diagnosis not present

## 2016-12-08 DIAGNOSIS — E860 Dehydration: Secondary | ICD-10-CM | POA: Diagnosis not present

## 2016-12-08 DIAGNOSIS — F419 Anxiety disorder, unspecified: Secondary | ICD-10-CM | POA: Diagnosis not present

## 2016-12-08 DIAGNOSIS — I252 Old myocardial infarction: Secondary | ICD-10-CM | POA: Diagnosis not present

## 2016-12-08 DIAGNOSIS — E785 Hyperlipidemia, unspecified: Secondary | ICD-10-CM | POA: Diagnosis not present

## 2016-12-08 DIAGNOSIS — Z79899 Other long term (current) drug therapy: Secondary | ICD-10-CM | POA: Diagnosis not present

## 2016-12-08 DIAGNOSIS — I10 Essential (primary) hypertension: Secondary | ICD-10-CM | POA: Diagnosis not present

## 2016-12-08 DIAGNOSIS — I2510 Atherosclerotic heart disease of native coronary artery without angina pectoris: Secondary | ICD-10-CM | POA: Diagnosis not present

## 2016-12-08 DIAGNOSIS — K219 Gastro-esophageal reflux disease without esophagitis: Secondary | ICD-10-CM | POA: Diagnosis not present

## 2016-12-08 DIAGNOSIS — Z7982 Long term (current) use of aspirin: Secondary | ICD-10-CM | POA: Diagnosis not present

## 2016-12-08 DIAGNOSIS — Z955 Presence of coronary angioplasty implant and graft: Secondary | ICD-10-CM | POA: Diagnosis not present

## 2016-12-08 DIAGNOSIS — I471 Supraventricular tachycardia: Secondary | ICD-10-CM | POA: Diagnosis not present

## 2016-12-08 DIAGNOSIS — F17210 Nicotine dependence, cigarettes, uncomplicated: Secondary | ICD-10-CM | POA: Diagnosis not present

## 2016-12-08 DIAGNOSIS — C9000 Multiple myeloma not having achieved remission: Secondary | ICD-10-CM | POA: Diagnosis not present

## 2016-12-08 LAB — BASIC METABOLIC PANEL
Anion gap: 12 (ref 5–15)
CHLORIDE: 107 mmol/L (ref 101–111)
CO2: 21 mmol/L — ABNORMAL LOW (ref 22–32)
Calcium: 9.7 mg/dL (ref 8.9–10.3)
Creatinine, Ser: 0.73 mg/dL (ref 0.44–1.00)
GFR calc Af Amer: >60 mL/min (ref >60)
GLUCOSE: 111 mg/dL — AB (ref 65–99)
POTASSIUM: 3.7 mmol/L (ref 3.5–5.1)
Sodium: 140 mmol/L (ref 135–145)

## 2016-12-08 LAB — MAGNESIUM: MAGNESIUM: 1.4 mg/dL — AB (ref 1.7–2.4)

## 2016-12-08 LAB — GLUCOSE, CAPILLARY: GLUCOSE-CAPILLARY: 92 mg/dL (ref 65–99)

## 2016-12-08 MED ORDER — SODIUM CHLORIDE 0.9 % IV BOLUS (SEPSIS)
1000.0000 mL | Freq: Once | INTRAVENOUS | Status: AC
  Administered 2016-12-08: 1000 mL via INTRAVENOUS

## 2016-12-08 MED ORDER — HYOSCYAMINE SULFATE 0.125 MG SL SUBL
0.2500 mg | SUBLINGUAL_TABLET | Freq: Once | SUBLINGUAL | Status: AC
  Administered 2016-12-08: 0.25 mg via SUBLINGUAL
  Filled 2016-12-08: qty 2

## 2016-12-08 MED ORDER — HYOSCYAMINE SULFATE 0.125 MG/ML PO SOLN
0.2500 mg | Freq: Once | ORAL | Status: DC
  Filled 2016-12-08: qty 2

## 2016-12-08 MED ORDER — HYOSCYAMINE SULFATE 0.125 MG PO TABS
0.2500 mg | ORAL_TABLET | Freq: Once | ORAL | Status: DC
  Filled 2016-12-08: qty 2

## 2016-12-08 MED ORDER — POLYETHYLENE GLYCOL 3350 17 G PO PACK
17.0000 g | PACK | Freq: Every day | ORAL | Status: DC
  Filled 2016-12-08: qty 1

## 2016-12-08 MED ORDER — MAGNESIUM SULFATE 2 GM/50ML IV SOLN
2.0000 g | Freq: Once | INTRAVENOUS | Status: AC
  Administered 2016-12-08: 2 g via INTRAVENOUS
  Filled 2016-12-08: qty 50

## 2016-12-08 NOTE — Consult Note (Signed)
ELECTROPHYSIOLOGY CONSULT NOTE    Patient ID: Abigail [name] MRN: [id number], DOB/AGE: [birth year] 71 y.o.  Admit date: 12/06/2016 Date of Consult: 12/08/2016   Primary Physician: Philis Fendt, MD Primary Cardiologist: Dr. Doylene Canard Requesting MD: Dr. [name]  Reason for Consultation: syncope  HPI: Abigail [name] is a 71 y.o. female CAD (remote PTCA 1999), PVD w/ b/l renal stents (2005), HTN, HLD, depression, GERD w/hx of gastritis and ulcer, + smoker, August 2017, admitted with symptomatic anemia and ultimately dx with multiple myeloma on out patient chemo, just prior to this admission she was seen out patient with c/o N/V and abdominal pain, poor appetite and weight loss given unremarkable prior w/u for the same was recommended to continue her PPI and  MiriLax.  She was admitted 12/06/16 after a near syncopal then syncopal event.  Record indicates poor PO intake and frequent BM (4 that day) she was given IVF in the ED, there is a notation of a HR in the ER of 35, this is reported as a false reading by H&P note, though are reports of periods of tachycardia.  Cardiology consult mentions the HR of 35 and multiple episodes of SVT/ATach  EP is being asked to see the patient for possible tachy-brady  The patient had gotten up from the couch to go to the BR, 1/2 way down the hall became weak, clammy and had to lean up against the wall for a few seconds until she felt better started towards the BR again and fainted.  Her sister was at the house, heard the noise and ran to her, the patient was already awake again by the time her sister got to her, she suspects only a few seconds.   She had no sensation of palpitations, CP or SOB before or afterwards. Though remained very weak, and her sister had to help her up and they needed to stop a couple times prior to getting back to the couch for her to "gain herself" once back to the couch and resting felt "fine".  She laid there for a few hors told her sister to go home  feeling well.  Some time later had to use the BR again got up slowly feeling ok using her walker as she got to the BR again felt weak, clammy and fainted.  She woke quickly and again other then feeling weak, asymptomatic.  She called her "caner RN" who instructed her to call 911.  She denies history of fainting previously, rarely in the past she has felt like she got up too quickly and had fleeting dizziness only.  LABS K+ 3.9, 3.7 BUN/Creat 9/1.06 >> 8/0.81 poc Trop 0.02 Trop I: 0.08, 0.09, 0.08 WBC 5.1 H/H 15/46 > 14/43 Plts 273 TSH 0.541  Past Medical History:  Diagnosis Date  . Anemia 07/2015  . Bilateral renal artery stenosis ([place]) 1999   s/p stenting  . Coronary artery disease 1999  . Depression   . Diverticulosis   . Duodenitis   . Gastric erosions   . Gastric ulcer   . GERD (gastroesophageal reflux disease)   . Heart murmur   . History of blood transfusion ~ 03/2016   "low HgB; practically nonexistent"  . Hyperlipidemia   . Hypertension   . MI (myocardial infarction) 1999  . Multiple myeloma ([place])   . Retinal hemorrhage, right eye   . Schatzki's ring   . Tubular adenoma of colon      Surgical History:  Past Surgical History:  Procedure  Laterality Date  . APPENDECTOMY    . BACK SURGERY    . CATARACT EXTRACTION W/ INTRAOCULAR LENS IMPLANT Right 2014  . CORONARY ANGIOPLASTY WITH STENT PLACEMENT  1999  . ESOPHAGOGASTRODUODENOSCOPY N/A 11/03/2015   Procedure: ESOPHAGOGASTRODUODENOSCOPY (EGD);  Surgeon: Carol Ada, MD;  Location: [facility] ENDOSCOPY;  Service: Endoscopy;  Laterality: N/A;  . ESOPHAGOGASTRODUODENOSCOPY (EGD) WITH PROPOFOL N/A 11/18/2016   Procedure: ESOPHAGOGASTRODUODENOSCOPY (EGD) WITH PROPOFOL;  Surgeon: Ladene Artist, MD;  Location: [facility] ENDOSCOPY;  Service: Endoscopy;  Laterality: N/A;  . LUMBAR [place]    . RENAL ARTERY STENT  1999   bil RAS so ? bil vs unilateral stents  . RETINAL LASER PROCEDURE Right 2012   "bleeding"  . TONSILLECTOMY    .  TOTAL ABDOMINAL HYSTERECTOMY       Prescriptions Prior to Admission  Medication Sig Dispense Refill Last Dose  . acyclovir (ZOVIRAX) 400 MG tablet TAKE 1 TABLET (400 MG TOTAL) BY MOUTH 2 (TWO) TIMES DAILY. 60 tablet 2 12/06/2016 at am  . amLODipine (NORVASC) 10 MG tablet Take 10 mg by mouth at bedtime.   5 12/05/2016 at Unknown time  . Ascorbic Acid (VITAMIN C) 1000 MG tablet Take 1,000 mg by mouth daily.    12/06/2016 at Unknown time  . aspirin EC 81 MG tablet Take 81 mg by mouth daily.   12/06/2016 at Unknown time  . calcium carbonate (OS-CAL - DOSED IN MG OF ELEMENTAL CALCIUM) 1250 (500 Ca) MG tablet Take 1 tablet (500 mg of elemental calcium total) by mouth daily with breakfast. 30 tablet 3 12/06/2016 at Unknown time  . Cholecalciferol (VITAMIN D PO) Take 1 capsule by mouth daily.   12/06/2016 at Unknown time  . dexamethasone (DECADRON) 4 MG tablet TAKE 40 MG (10 TABLETS) ONCE A WEEK ON DAY OF VELCADE INJECTION  2 12/04/2016  . docusate sodium (COLACE) 100 MG capsule Take 1 capsule (100 mg total) by mouth 2 (two) times daily. 60 capsule 0 12/06/2016 at Unknown time  . feeding supplement, ENSURE ENLIVE, (ENSURE ENLIVE) LIQD Take 237 mLs by mouth 2 (two) times daily between meals. 237 mL 30 12/06/2016 at Unknown time  . FLUoxetine (PROZAC) 10 MG capsule Take 10 mg by mouth at bedtime.    12/05/2016 at Unknown time  . isosorbide mononitrate (IMDUR) 60 MG 24 hr tablet Take 1 tablet (60 mg total) by mouth daily. 60 tablet 3 12/06/2016 at Unknown time  . KLOR-CON M10 10 MEQ tablet Take 10 mEq by mouth daily.    12/06/2016 at Unknown time  . ondansetron (ZOFRAN) 4 MG tablet Take 1 tablet (4 mg total) by mouth every 8 (eight) hours as needed for nausea or vomiting. 20 tablet 0 Past Month at Unknown time  . pantoprazole (PROTONIX) 40 MG tablet Take 1 tablet (40 mg total) by mouth 2 (two) times daily before a meal. 60 tablet 0 12/06/2016 at am  . polyethylene glycol (MIRALAX / GLYCOLAX) packet Take 17 g by mouth  daily. (Patient taking differently: Take 17 g by mouth 2 (two) times daily. ) 100 each 0 12/06/2016 at am  . pravastatin (PRAVACHOL) 40 MG tablet Take 40 mg by mouth every evening.  1 12/05/2016 at Unknown time  . prochlorperazine (COMPAZINE) 5 MG tablet Take 1 tablet (5 mg total) by mouth every 6 (six) hours as needed for nausea or vomiting. 30 tablet 0 Past Month at Unknown time  . traMADol (ULTRAM) 50 MG tablet Take 0.5-1 tablets (25-50 mg total) by mouth  every 8 (eight) hours as needed. 20 tablet 0 Past Month at Unknown time  . feeding supplement (BOOST / RESOURCE BREEZE) LIQD Take 1 Container by mouth 3 (three) times daily between meals. (Patient not taking: Reported on 12/06/2016) 237 mL 30 Not Taking at Unknown time    Inpatient Medications:  . acyclovir  400 mg Oral BID  . aspirin EC  81 mg Oral Daily  . DULoxetine  20 mg Oral Daily  . enoxaparin (LOVENOX) injection  40 mg Subcutaneous Q24H  . feeding supplement (ENSURE ENLIVE)  237 mL Oral BID BM  . FLUoxetine  10 mg Oral QHS  . ketorolac  30 mg Intravenous Q8H  . metoprolol tartrate  12.5 mg Oral BID  . pantoprazole  40 mg Oral BID AC  . polyethylene glycol  17 g Oral QODAY  . pravastatin  40 mg Oral QPM  . sodium chloride flush  3 mL Intravenous Q12H  . vitamin C  1,000 mg Oral Daily    Allergies:  Allergies  Allergen Reactions  . Sulfa Antibiotics Rash    Social History   Social History  . Marital status: Single    Spouse name: N/A  . Number of children: N/A  . Years of education: N/A   Occupational History  . Not on file.   Social History Main Topics  . Smoking status: Current Every Day Smoker    Packs/day: 0.50    Years: 48.00    Types: Cigarettes  . Smokeless tobacco: Never Used  . Alcohol use No  . Drug use: No  . Sexual activity: No   Other Topics Concern  . Not on file   Social History Narrative  . No narrative on file     Family History  Problem Relation Age of Onset  . Colon cancer Neg Hx        Review of Systems: All other systems reviewed and are otherwise negative except as noted above.  Physical Exam: Vitals:   12/07/16 2110 12/08/16 0016 12/08/16 0450 12/08/16 0818  BP: (!) 167/112 (!) 177/96 (!) 160/107 (!) 153/104  Pulse: 65 (!) 57 77 70  Resp:    19  Temp: 100.2 F (37.9 C) 98.9 F (37.2 C) 98.6 F (37 C) 99.3 F (37.4 C)  TempSrc: Oral Oral Oral Oral  SpO2:    100%  Weight:   132 lb (59.9 kg)   Height:        GEN- The patient is well appearing, alert and oriented x 3 today.   HEENT: normocephalic, atraumatic; sclera clear, conjunctiva pink; hearing intact; oropharynx clear; neck supple, no JVP Lymph- no cervical lymphadenopathy Lungs- CTA b/l  normal work of breathing.  No wheezes, rales, rhonchi Heart- RRR, no murmurs, rubs or gallops, PMI not laterally displaced GI- soft, non-tender, non-distended Extremities- no clubbing, cyanosis, no edema, dressing R foot MS- no significant deformity or atrophy Skin- warm and dry, no rash or lesion Psych- euthymic mood, full affect Neuro- no gross deficits observed  Labs:   Lab Results  Component Value Date   WBC 4.5 12/07/2016   HGB 14.1 12/07/2016   HCT 43.8 12/07/2016   MCV 91.8 12/07/2016   PLT 243 12/07/2016    Recent Labs Lab 12/03/16 1145  12/07/16 0605  NA 139  < > 141  K 3.9  < > 3.7  CL  --   < > 109  CO2 21*  < > 21*  BUN 13.6  < > 8  CREATININE  1.2*  < > 0.81  CALCIUM 9.9  < > 9.1  PROT 7.2  6.8  --   --   BILITOT 0.66  --   --   ALKPHOS 78  --   --   ALT 16  --   --   AST 17  --   --   GLUCOSE 123  < > 96  < > = values in this interval not displayed.    Radiology/Studies:  X-ray Chest Pa And Lateral Result Date: 12/07/2016 CLINICAL DATA:  Syncope EXAM: CHEST  2 VIEW COMPARISON:  Chest CT from 12/31/2011, CXR 11/14/2016 FINDINGS: The heart is normal in size. There is aortic atherosclerosis without aneurysm. Right paratracheal soft tissue prominence is consistent with tortuous  vessels based on prior chest CT. The lungs are clear. No acute osseous abnormality is noted. IMPRESSION: No active cardiopulmonary disease. Electronically Signed   By: Ashley Royalty M.D.   On: 12/07/2016 01:32   Ct Head Wo Contrast Result Date: 12/07/2016 CLINICAL DATA:  Syncope x2, multiple myeloma, hypertension, hyperlipidemia. EXAM: CT HEAD WITHOUT CONTRAST TECHNIQUE: Contiguous axial images were obtained from the base of the skull through the vertex without intravenous contrast. COMPARISON:  None. FINDINGS: BRAIN: Mild superficial atrophy with minimal chronic appearing small vessel ischemic disease of periventricular white matter. No acute intracranial hemorrhage, intra-axial mass nor extra-axial fluid. No acute large vascular territory infarction. VASCULAR: Moderate calcific atherosclerosis of the carotid siphons. SKULL: No skull fracture. Mottled lytic appearance of the bony calvarium in keeping with multiple myeloma. No significant scalp soft tissue swelling. SINUSES/ORBITS: The mastoid air-cells are clear. The included paranasal sinuses are well-aerated.The included ocular globes and orbital contents are non-suspicious. OTHER: None. IMPRESSION: Cerebral atrophy with chronic appearing small vessel ischemic disease of periventricular white matter. No acute intracranial abnormality. Mottled lytic appearance of the bony calvarium consistent with the patient's history of multiple myeloma. Electronically Signed   By: Ashley Royalty M.D.   On: 12/07/2016 01:29     EKG: #1  SR > SVT 135bpm #2 SR, 66bpm, PR 140m, QRS 944m QTc 42933mPACs #3 SR 60bpm,PR 146m9mRS 76ms32mc 462ms 81mR 99bpm TELEMETRY: SB/SR 50's-70's, brief episodes of SVT lasting 3-5 seconds occur fairly often 12/07/16: TTE Study Conclusions - Left ventricle: The cavity size was normal. There was mild   concentric hypertrophy. Systolic function was normal. The   estimated ejection fraction was in the range of 55% to 60%. There   was no  dynamic obstruction. Wall motion was normal; there were no   regional wall motion abnormalities. Doppler parameters are   consistent with abnormal left ventricular relaxation (grade 1   diastolic dysfunction). - Atrial septum: No defect or patent foramen ovale was identified. - Pulmonary arteries: Systolic pressure was mildly to moderately   increased. PA peak pressure: 37 mm Hg (S).   Assessment and Plan:   1. Syncope     By description, sounds BP/volume related especially given poor PO intake, N/V, and reportes of 4 BM the day of admission as well   2. SVT      She has been asymptomatic here in the hospital with these      She reports she ambulated yesterday with PT and flet well.      She has no perception of palpitations       3. Bradycardia     Unable to find on telemetry HR 30's     H&P make mention this was thought to be  artifactial computer entry  Agree with betablocker to HR and BP tolerance for her SVT, out patient follow up with Dr. Doylene Canard  Continue further/ongoing care with the primary team, EP remains available, please recall if needed.  Venetia Night, PA-C 12/08/2016 8:39 AM  I have seen and examined this patient with [name].  Agree with above, note added to reflect my findings.  On exam, regular rhythm, no murmurs, lungs clear. Had episode of syncope x2. On telemetry, found to have episodes of SVT and likely atrial tachycardia. She has felt well since being in the hospital this admission with only weakness when up and moving around, but none when working with PT. She has a charted HR of 35, but notes say that this was due to artifact. At this time, agree with medical management of her SVT. Have started her on metoprolol and would continue, with no evidence of significant bradycardia on telemetry. It is also unlikely that her SVT caused her syncope as she has been feeling well in the hospital while having arrhythmia. Should her SVT continue, would work to  titrate metoprolol as HR and BP allows.    Avalyn Molino M. Adley Mazurowski MD 12/08/2016 10:35 AM

## 2016-12-08 NOTE — Progress Notes (Signed)
Subjective:  Appreciate EP consultation and help.  Patient denies any chest pain or shortness of breath.  Denies palpitations.  No further episodes of syncope.  Objective:  Vital Signs in the last 24 hours: Temp:  [98.6 F (37 C)-100.2 F (37.9 C)] 99.3 F (37.4 C) (02/19 0818) Pulse Rate:  [57-77] 70 (02/19 0818) Resp:  [19] 19 (02/19 0818) BP: (153-177)/(96-112) 153/104 (02/19 0818) SpO2:  [100 %] 100 % (02/19 0818) Weight:  [132 lb (59.9 kg)] 132 lb (59.9 kg) (02/19 0450)  Intake/Output from previous day: 02/18 0701 - 02/19 0700 In: 1202.5 [P.O.:340; I.V.:862.5] Out: 1600 [Urine:1600] Intake/Output from this shift: No intake/output data recorded.  Physical Exam: Neck: no adenopathy, no carotid bruit, no JVD and supple, symmetrical, trachea midline Lungs: clear to auscultation bilaterally Heart: regular rate and rhythm, S1, S2 normal and soft systolic murmur noted Abdomen: soft, non-tender; bowel sounds normal; no masses,  no organomegaly  Lab Results:  Recent Labs  12/06/16 1615 12/07/16 0605  WBC 5.1 4.5  HGB 15.2* 14.1  PLT 273 243    Recent Labs  12/07/16 0605 12/08/16 0931  NA 141 140  K 3.7 3.7  CL 109 107  CO2 21* 21*  GLUCOSE 96 111*  BUN 8 <5*  CREATININE 0.81 0.73    Recent Labs  12/07/16 0226 12/07/16 0605  TROPONINI 0.09* 0.08*   Hepatic Function Panel No results for input(s): PROT, ALBUMIN, AST, ALT, ALKPHOS, BILITOT, BILIDIR, IBILI in the last 72 hours.  Recent Labs  12/07/16 0605  CHOL 220*   No results for input(s): PROTIME in the last 72 hours.  Imaging: Imaging results have been reviewed and X-ray Chest Pa And Lateral  Result Date: 12/07/2016 CLINICAL DATA:  Syncope EXAM: CHEST  2 VIEW COMPARISON:  Chest CT from 12/31/2011, CXR 11/14/2016 FINDINGS: The heart is normal in size. There is aortic atherosclerosis without aneurysm. Right paratracheal soft tissue prominence is consistent with tortuous vessels based on prior chest CT.  The lungs are clear. No acute osseous abnormality is noted. IMPRESSION: No active cardiopulmonary disease. Electronically Signed   By: Ashley Royalty M.D.   On: 12/07/2016 01:32   Dg Abd 1 View  Result Date: 12/08/2016 CLINICAL DATA:  Nausea and generalized abdominal pain. EXAM: ABDOMEN - 1 VIEW COMPARISON:  11/23/2016 FINDINGS: Gas pattern is unremarkable without evidence of ileus or obstruction. Two renal vascular stents again noted. Ordinary lower lumbar degenerative changes. IMPRESSION: No acute finding. Electronically Signed   By: Nelson Chimes M.D.   On: 12/08/2016 09:00   Ct Head Wo Contrast  Result Date: 12/07/2016 CLINICAL DATA:  Syncope x2, multiple myeloma, hypertension, hyperlipidemia. EXAM: CT HEAD WITHOUT CONTRAST TECHNIQUE: Contiguous axial images were obtained from the base of the skull through the vertex without intravenous contrast. COMPARISON:  None. FINDINGS: BRAIN: Mild superficial atrophy with minimal chronic appearing small vessel ischemic disease of periventricular white matter. No acute intracranial hemorrhage, intra-axial mass nor extra-axial fluid. No acute large vascular territory infarction. VASCULAR: Moderate calcific atherosclerosis of the carotid siphons. SKULL: No skull fracture. Mottled lytic appearance of the bony calvarium in keeping with multiple myeloma. No significant scalp soft tissue swelling. SINUSES/ORBITS: The mastoid air-cells are clear. The included paranasal sinuses are well-aerated.The included ocular globes and orbital contents are non-suspicious. OTHER: None. IMPRESSION: Cerebral atrophy with chronic appearing small vessel ischemic disease of periventricular white matter. No acute intracranial abnormality. Mottled lytic appearance of the bony calvarium consistent with the patient's history of multiple myeloma. Electronically Signed  By: Ashley Royalty M.D.   On: 12/07/2016 01:29    Cardiac Studies:  Assessment/Plan:  Status post syncope 2. Status post  SVT/multifocal atrial tachycardia Minimally elevated troponin I secondary to above doubt significant MI Hypertension Hyperlipidemia Coronary artery disease status post PCI to left circumflex and remote past History of bilateral renal stenting Multiple myeloma GERD History of gastritis/peptic ulcer disease Plan Continue present management. Uptitrate beta blockers as tolerated. Increase ambulation  LOS: 0 days    Charolette Forward 12/08/2016, 12:06 PM

## 2016-12-08 NOTE — Evaluation (Signed)
Occupational Therapy Evaluation Patient Details Name: Abigail [name] MRN: [id number] DOB: [birth year] Today's Date: 12/08/2016    History of Present Illness 71 yo female admitted with LOC x2. Hx of multiple myeloma-undergoing chemo, MI, CAD, Anemia, renal stenosis, depression, GERD, HLD, HTN MI.    Clinical Impression   Pt is at Mod I level with ADLs and ADL mobility. No further acute OT is indicated at this time    Follow Up Recommendations  No OT follow up    Equipment Recommendations  None recommended by OT    Recommendations for Other Services       Precautions / Restrictions Precautions Precautions: Fall Restrictions Weight Bearing Restrictions: No      Mobility Bed Mobility Overal bed mobility: Modified Independent             General bed mobility comments: Use of railings to get EOB  Transfers Overall transfer level: Modified independent Equipment used: Rolling walker (2 wheeled);None             General transfer comment: Able to stand without hands on assistance from EOB    Balance Overall balance assessment: Modified Independent           Standing balance-Leahy Scale: Good                              ADL Overall ADL's : Modified independent                                       General ADL Comments: slow pace, increased time     Vision   Vision Assessment?: No apparent visual deficits                Pertinent Vitals/Pain Pain Assessment: No/denies pain Pain Intervention(s): Monitored during session     Hand Dominance Right   Extremity/Trunk Assessment Upper Extremity Assessment Upper Extremity Assessment: Generalized weakness   Lower Extremity Assessment Lower Extremity Assessment: Defer to PT evaluation   Cervical / Trunk Assessment Cervical / Trunk Assessment: Normal   Communication Communication Communication: No difficulties   Cognition Arousal/Alertness: Awake/alert Behavior During  Therapy: WFL for tasks assessed/performed Overall Cognitive Status: Within Functional Limits for tasks assessed                     General Comments   pt very pleasant and cooperative                 Home Living Family/patient expects to be discharged to:: Private residence Living Arrangements: Alone Available Help at Discharge: Family;Available PRN/intermittently Type of Home: Apartment Home Access: Stairs to enter Entrance Stairs-Number of Steps: 2 Entrance Stairs-Rails: None Home Layout: One level     Bathroom Shower/Tub: Tub/shower unit;Curtain   Biochemist, clinical: Standard     Home Equipment: Environmental consultant - 2 wheels;Grab bars - tub/shower          Prior Functioning/Environment Level of Independence: Independent        Comments: was independent, was able to get out of the house        OT Problem List: Decreased activity tolerance      OT Treatment/Interventions:      OT Goals(Current goals can be found in the care plan section) Acute Rehab OT Goals Patient Stated Goal: to get back home  OT Goal Formulation: With patient  OT Frequency:     Barriers to D/C:    no barriers                     End of Session Equipment Utilized During Treatment: Gait belt  Activity Tolerance: Patient tolerated treatment well Patient left: in bed;with call bell/phone within reach  OT Visit Diagnosis: Muscle weakness (generalized) (M62.81);History of falling (Z91.81)                ADL either performed or assessed with clinical judgement  Time: 1039-1058 OT Time Calculation (min): 19 min Charges:  OT General Charges $OT Visit: 1 Procedure OT Evaluation $OT Eval Low Complexity: 1 Procedure G-Codes: OT G-codes **NOT FOR INPATIENT CLASS** Functional Assessment Tool Used: AM-PAC 6 Clicks Daily Activity;Clinical judgement Functional Limitation: Self care Self Care Current Status (J5872): 0 percent impaired, limited or restricted Self Care Goal Status  (B6184): 0 percent impaired, limited or restricted Self Care Discharge Status (Q5927): 0 percent impaired, limited or restricted     [name] 12/08/2016, 1:02 PM

## 2016-12-08 NOTE — Progress Notes (Signed)
[facility] Progress Note  Patient: Abigail [name] OAC:[id number]   PCP: Philis Fendt, MD DOB: 09/29/1946   DOA: 12/06/2016   DOS: 12/08/2016   Date of Service: the patient was seen and examined on 12/08/2016  Subjective: feeling better, no abdominal pain. No dizziness.  Brief hospital course: Pt. with PMH of CAD, Multiple Myeloma, GERD, renal art stenosis, HTN, HLD; admitted on 12/06/2016, with complaint of syncope, was found to have tachy-brady syndrome and elevated troponin. Currently further plan is hydrate and expect resolution of orthostatic.  Assessment and Plan: 1. Syncope and collapse Orthostatic hypotension Tachy-brady syndrome. ? MAT ? SVT Accelerated hypertension. Dehydration in the setting of diarrhea caused by stool softeners. Negative head CT scan, chest x-ray, urinalysis, TSH level Orthostatic BP dropped down to 010 to 85 systolic. Continue IV hydration, increase from 75 to 125, also give bolus. Tele shows tachy-brady syndrome, Cardiology was consulted and they have consulted EP. EP does not feel that her tachy-brady syndrome does not appear to associated with her syncope. Continue lopressor.  PT, OT evaluation does not Recommend any outpatient therapy Echocardiogram does not show any WMA. EF normal  2. Hypertension: Hold amlodipine, Imdur. Lopressor added due to SVT  3. History of coronary artery disease status post PCI:  Continue aspirin, statin, Imdur. No chest pain. Continue telemetry monitor. Troponin trend does not suggest any ACS. Echocardiogram also unremarkable.  4 History of multiple myeloma on chemotherapy outpatient:  Recommended to follow up with oncologist after discharge.  5 Gastritis and history of acid reflux:  No active issues now. Continue Protonix. Cardiac diet ordered.  6 History of anxiety depression:  Mood is stable today. Continue home medications.  7. Left lower quadrant pain Extensive workup including multiple x-rays, MRI  abdomen with and without contrast, 2 CAT scans, gastric emptying panel, HIDA scan has been negative Patient has received often on narcotic pain medications from ER and only found to be having constipation on the imaging. Avoid narcotics. IV toradol scheduled. Resume miralax. initiate a trial of cymbalta.   Bowel regimen: last BM 12/06/2016 Diet: cardiac diet DVT Prophylaxis: subcutaneous Heparin  Advance goals of care discussion: full code  Family Communication: no family was present at bedside, at the time of interview.  Disposition:  Discharge to home. Expected discharge date: 12/08/2016, depending on cardiology work up  Consultants: cardiology, EP Procedures: echocardiogram  Antibiotics: Anti-infectives    Start     Dose/Rate Route Frequency Ordered Stop   12/06/16 2200  acyclovir (ZOVIRAX) tablet 400 mg     400 mg Oral 2 times daily 12/06/16 1956        Objective: Physical Exam: Vitals:   12/08/16 0016 12/08/16 0450 12/08/16 0818 12/08/16 1333  BP: (!) 177/96 (!) 160/107 (!) 153/104 (!) 119/93  Pulse: (!) 57 77 70 (!) 107  Resp:   19 18  Temp: 98.9 F (37.2 C) 98.6 F (37 C) 99.3 F (37.4 C) 98.1 F (36.7 C)  TempSrc: Oral Oral Oral Oral  SpO2:   100% 100%  Weight:  59.9 kg (132 lb)    Height:        Intake/Output Summary (Last 24 hours) at 12/08/16 1342 Last data filed at 12/08/16 1230  Gross per 24 hour  Intake           1802.5 ml  Output             2951 ml  Net          -1148.5 ml  Filed Weights   12/06/16 1946 12/07/16 0512 12/08/16 0450  Weight: 64.6 kg (142 lb 8 oz) 65.8 kg (145 lb) 59.9 kg (132 lb)   General: Alert, Awake and Oriented to Time, Place and Person. Appear in mild distress, affect appropriate Eyes: PERRL, Conjunctiva normal ENT: Oral Mucosa clear moist. Neck: no JVD, no Abnormal Mass Or lumps Cardiovascular: S1 and S2 Present, no Murmur, Respiratory: Bilateral Air entry equal and Decreased, no use of accessory muscle, Clear to  Auscultation, no Crackles, no wheezes Abdomen: Bowel Sound present, Soft and no tenderness Skin: no redness, no Rash, no induration Extremities: no Pedal edema, no calf tenderness Neurologic: Grossly no focal neuro deficit. Bilaterally Equal motor strength  Data Reviewed: CBC:  Recent Labs Lab 12/03/16 1145 12/06/16 1615 12/07/16 0605  WBC 4.1 5.1 4.5  NEUTROABS 3.5  --   --   HGB 14.3 15.2* 14.1  HCT 42.2 46.3* 43.8  MCV 92.3 91.5 91.8  PLT 376 273 497   Basic Metabolic Panel:  Recent Labs Lab 12/03/16 1145 12/06/16 1615 12/07/16 0605 12/08/16 0931  NA 139 139 141 140  K 3.9 3.9 3.7 3.7  CL  --  103 109 107  CO2 21* 24 21* 21*  GLUCOSE 123 119* 96 111*  BUN 13.'6 9 8 ' <5*  CREATININE 1.2* 1.06* 0.81 0.73  CALCIUM 9.9 10.0 9.1 9.7  MG  --   --   --  1.4*    Liver Function Tests:  Recent Labs Lab 12/03/16 1145  AST 17  ALT 16  ALKPHOS 78  BILITOT 0.66  PROT 7.2  6.8  ALBUMIN 4.4   No results for input(s): LIPASE, AMYLASE in the last 168 hours. No results for input(s): AMMONIA in the last 168 hours. Coagulation Profile: No results for input(s): INR, PROTIME in the last 168 hours. Cardiac Enzymes:  Recent Labs Lab 12/06/16 2005 12/07/16 0226 12/07/16 0605  TROPONINI 0.08* 0.09* 0.08*   BNP (last 3 results) No results for input(s): PROBNP in the last 8760 hours.  CBG:  Recent Labs Lab 12/07/16 0647 12/07/16 1130 12/08/16 0643  GLUCAP 81 108* 92   Studies: Dg Abd 1 View  Result Date: 12/08/2016 CLINICAL DATA:  Nausea and generalized abdominal pain. EXAM: ABDOMEN - 1 VIEW COMPARISON:  11/23/2016 FINDINGS: Gas pattern is unremarkable without evidence of ileus or obstruction. Two renal vascular stents again noted. Ordinary lower lumbar degenerative changes. IMPRESSION: No acute finding. Electronically Signed   By: Nelson Chimes M.D.   On: 12/08/2016 09:00    Scheduled Meds: . acyclovir  400 mg Oral BID  . aspirin EC  81 mg Oral Daily  .  DULoxetine  20 mg Oral Daily  . enoxaparin (LOVENOX) injection  40 mg Subcutaneous Q24H  . feeding supplement (ENSURE ENLIVE)  237 mL Oral BID BM  . FLUoxetine  10 mg Oral QHS  . ketorolac  30 mg Intravenous Q8H  . metoprolol tartrate  12.5 mg Oral BID  . pantoprazole  40 mg Oral BID AC  . polyethylene glycol  17 g Oral Daily  . pravastatin  40 mg Oral QPM  . sodium chloride flush  3 mL Intravenous Q12H  . vitamin C  1,000 mg Oral Daily   Continuous Infusions: . 0.9 % NaCl with KCl 20 mEq / L 125 mL/hr at 12/08/16 1137   PRN Meds: acetaminophen **OR** acetaminophen, ondansetron **OR** ondansetron (ZOFRAN) IV, prochlorperazine  Time spent: 30 minutes  Author: Berle Mull, MD [facility] Pager: [phone]  12/08/2016 1:42 PM  If 7PM-7AM, please contact night-coverage at [email or web address], password [email or web address]

## 2016-12-09 DIAGNOSIS — R55 Syncope and collapse: Secondary | ICD-10-CM | POA: Diagnosis not present

## 2016-12-09 DIAGNOSIS — I951 Orthostatic hypotension: Secondary | ICD-10-CM | POA: Diagnosis not present

## 2016-12-09 LAB — BASIC METABOLIC PANEL
Anion gap: 10 (ref 5–15)
BUN: <5 mg/dL — ABNORMAL LOW (ref 6–20)
CALCIUM: 8.8 mg/dL — AB (ref 8.9–10.3)
CO2: 19 mmol/L — AB (ref 22–32)
CREATININE: 0.72 mg/dL (ref 0.44–1.00)
Chloride: 109 mmol/L (ref 101–111)
GFR calc Af Amer: >60 mL/min (ref >60)
GFR calc non Af Amer: >60 mL/min (ref >60)
GLUCOSE: 97 mg/dL (ref 65–99)
Potassium: 4.1 mmol/L (ref 3.5–5.1)
Sodium: 138 mmol/L (ref 135–145)

## 2016-12-09 LAB — CBC
HEMATOCRIT: 38.8 % (ref 36.0–46.0)
Hemoglobin: 12.6 g/dL (ref 12.0–15.0)
MCH: 29.4 pg (ref 26.0–34.0)
MCHC: 32.5 g/dL (ref 30.0–36.0)
MCV: 90.7 fL (ref 78.0–100.0)
Platelets: 210 10*3/uL (ref 150–400)
RBC: 4.28 MIL/uL (ref 3.87–5.11)
RDW: 16.2 % — AB (ref 11.5–15.5)
WBC: 3.8 10*3/uL — ABNORMAL LOW (ref 4.0–10.5)

## 2016-12-09 LAB — KAPPA/LAMBDA LIGHT CHAINS
IG KAPPA FREE LIGHT CHAIN: 4.2 mg/L (ref 3.3–19.4)
IG LAMBDA FREE LIGHT CHAIN: 6.2 mg/L (ref 5.7–26.3)
Kappa/Lambda FluidC Ratio: 0.68 (ref 0.26–1.65)

## 2016-12-09 LAB — GLUCOSE, CAPILLARY: Glucose-Capillary: 86 mg/dL (ref 65–99)

## 2016-12-09 LAB — MAGNESIUM: Magnesium: 1.6 mg/dL — ABNORMAL LOW (ref 1.7–2.4)

## 2016-12-09 MED ORDER — COSYNTROPIN 0.25 MG IJ SOLR: 0.2500 mg | Freq: Once | INTRAMUSCULAR | Status: DC

## 2016-12-09 MED ORDER — SODIUM CHLORIDE 0.9 % IV BOLUS (SEPSIS)
1000.0000 mL | Freq: Once | INTRAVENOUS | Status: AC
  Administered 2016-12-09: 1000 mL via INTRAVENOUS

## 2016-12-09 MED ORDER — DOCUSATE SODIUM 100 MG PO CAPS: 100.0000 mg | ORAL_CAPSULE | Freq: Two times a day (BID) | ORAL | Status: DC | PRN

## 2016-12-09 MED ORDER — MIDODRINE HCL 5 MG PO TABS
2.5000 mg | ORAL_TABLET | Freq: Two times a day (BID) | ORAL | Status: DC
  Administered 2016-12-09 (×2): 2.5 mg via ORAL
  Filled 2016-12-09 (×2): qty 1

## 2016-12-09 MED ORDER — DOCUSATE SODIUM 100 MG PO CAPS: 100.0000 mg | ORAL_CAPSULE | Freq: Two times a day (BID) | ORAL | 0 refills | Status: DC | PRN

## 2016-12-09 MED ORDER — POLYETHYLENE GLYCOL 3350 17 G PO PACK: 17.0000 g | PACK | Freq: Every day | ORAL | Status: DC | PRN

## 2016-12-09 MED ORDER — MAGNESIUM SULFATE 2 GM/50ML IV SOLN
2.0000 g | Freq: Once | INTRAVENOUS | Status: AC
  Administered 2016-12-09: 2 g via INTRAVENOUS
  Filled 2016-12-09: qty 50

## 2016-12-09 MED ORDER — MIDODRINE HCL 2.5 MG PO TABS: 2.5000 mg | ORAL_TABLET | Freq: Two times a day (BID) | ORAL | 0 refills | Status: DC

## 2016-12-09 MED ORDER — DULOXETINE HCL 20 MG PO CPEP: 20.0000 mg | ORAL_CAPSULE | Freq: Every day | ORAL | 0 refills | Status: DC

## 2016-12-09 MED ORDER — METOPROLOL TARTRATE 25 MG PO TABS: 12.5000 mg | ORAL_TABLET | Freq: Two times a day (BID) | ORAL | 0 refills | Status: DC

## 2016-12-09 MED ORDER — SODIUM CHLORIDE 0.9 % IV BOLUS (SEPSIS): 1000.0000 mL | Freq: Once | INTRAVENOUS | Status: DC

## 2016-12-09 NOTE — Progress Notes (Signed)
Physical Therapy Treatment Patient Details Name: Abigail [name] MRN: [id number] DOB: [birth year] Today's Date: 12/09/2016    History of Present Illness 71 yo female admitted with LOC x2. Hx of multiple myeloma-undergoing chemo, MI, CAD, Anemia, renal stenosis, depression, GERD, HLD, HTN MI.     PT Comments    Pt progressing toward goals. Pt encouraged to continue use of RW due to rapid fatigue and decreased balance secondary to deconditioning from hospital stay. Pt recommended d/c home with 24 hour supervision due to physical deconditioning and inability to ascend/descend stairs without UE support, which she does not have at home. At this time, pt needs 24 hour assist to safely go home. Pt is verifying it is ok with sister go to home with her and states that she does not feel safe going home alone. Pt would also benefit from HHPT to address strength and return to PLOF. Will continue to follow.    Follow Up Recommendations  Home health PT;Supervision/Assistance - 24 hour     Equipment Recommendations  None recommended by PT    Recommendations for Other Services       Precautions / Restrictions Precautions Precautions: Fall Restrictions Weight Bearing Restrictions: No    Mobility  Bed Mobility Overal bed mobility: Modified Independent             General bed mobility comments: increased time; use of bed rail to get EOB  Transfers Overall transfer level: Needs assistance Equipment used: None Transfers: Sit to/from Stand Sit to Stand: Supervision         General transfer comment: supervision for safety; no LOB or dizziness upon standing  Ambulation/Gait Ambulation/Gait assistance: Min guard;Supervision Ambulation Distance (Feet): 200 Feet Assistive device: None;Rolling walker (2 wheeled) Gait Pattern/deviations: Step-through pattern;Decreased step length - right;Decreased step length - left;Decreased stride length;Trunk flexed;Wide base of support Gait velocity:  decreased Gait velocity interpretation: Below normal speed for age/gender General Gait Details: min guard for safety without use of AD for 100'; supervision for safety with use of RW for 100'; pt given RW when PT noticed her grabbing hand rails in hallway for comfort due to fear of falling   Stairs Stairs: Yes   Stair Management: One rail Right;Step to pattern;Forwards Number of Stairs: 3 General stair comments: 1 hand rail and 1 HHA to ascend 3 steps; to descend, pt sideways with step to pattern with bilat UE on handrail  Wheelchair Mobility    Modified Rankin (Stroke Patients Only)       Balance Overall balance assessment: Modified Independent (need A with fatigue; pt reach for handrails with amb)           Standing balance-Leahy Scale: Good                      Cognition Arousal/Alertness: Awake/alert Behavior During Therapy: WFL for tasks assessed/performed Overall Cognitive Status: Within Functional Limits for tasks assessed                      Exercises      General Comments        Pertinent Vitals/Pain Pain Assessment: 0-10 Pain Score: 2  Pain Location: stomach Pain Descriptors / Indicators: Constant;Discomfort Pain Intervention(s): Limited activity within patient's tolerance;Monitored during session    Home Living                      Prior Function  PT Goals (current goals can now be found in the care plan section) Acute Rehab PT Goals Patient Stated Goal: to get back home  Progress towards PT goals: Progressing toward goals    Frequency    Min 3X/week      PT Plan Discharge plan needs to be updated    Co-evaluation             End of Session Equipment Utilized During Treatment: Gait belt Activity Tolerance: Patient tolerated treatment well Patient left: in bed;with call bell/phone within reach Nurse Communication: Mobility status PT Visit Diagnosis: Unsteadiness on feet (R26.81);Other  abnormalities of gait and mobility (R26.89);Muscle weakness (generalized) (M62.81);Pain Pain - part of body:  (stomach)     Time: 0223-3612 PT Time Calculation (min) (ACUTE ONLY): 19 min  Charges:  $Gait Training: 8-22 mins                    G Codes:       Tracie Harrier 2016-12-12, 3:41 PM

## 2016-12-09 NOTE — Telephone Encounter (Signed)
Several messages today from [facility] requesting refill of Revlimid. Returned call to [place] with [facility] to inform them refill will be ordered after office visit on 2/22.

## 2016-12-09 NOTE — Discharge Summary (Signed)
[facility] Discharge Summary   Patient: Abigail [name] PFO:[id number]   PCP: Dorrene German, MD DOB: Aug 19, 1946   Date of admission: 12/06/2016   Date of discharge:  12/09/2016    Discharge Diagnoses:  Principal Problem:   Syncope and collapse Active Problems:   Coronary atherosclerosis of native coronary artery   Multiple myeloma (HCC)   LLQ pain   Tachycardia-bradycardia (HCC)   Essential hypertension   Orthostatic hypotension   Syncope   Admitted From: home Disposition:  Home with home health  Recommendations for Outpatient Follow-up:  1. Follow-up with PCP in one week. 2. Follow-up with cardiology in 2 weeks. 3. Follow-up with hematology as recommended   Follow-up Information    Dorrene German, MD. Schedule an appointment as soon as possible for a visit in 1 week(s).   Specialty:  Internal Medicine Contact information: [address] [place] [address] [phone]        Ricki Rodriguez, MD. Schedule an appointment as soon as possible for a visit in 2 week(s).   Specialty:  Cardiology Contact information: [address] [place] [address] [phone]        [facility] Follow up.   Why:  HHRN/PT arranged- please allow 24-48 hr post discharge for them to contact you for home visit arrangements.  Contact information: [address] [place] [address] [phone]          Diet recommendation: Cardiac diet  Activity: The patient is advised to gradually reintroduce usual activities.  Discharge Condition: good  Code Status: Full code  History of present illness: As per the H and P dictated on admission, "Abigail [name] is a 71 y.o. female with medical history significant of hypertension, acid reflux, gastritis, coronary artery disease status post stent on aspirin, dyslipidemia, multiple myeloma on outpatient chemotherapy, recently admitted for nausea vomiting underwent GI evaluation, discharge with MiraLAX and  bowel regimen presented with syncopal episode today. She reported that she felt lightheadedness and dizziness and generalized weakness which lead to syncopal episode. The first episode around 10 AM today which was on weakness. In the afternoon patient had similar episode and reported losing her consciousness for a brief period of time. Patient reported that since recent hospital discharge she was only drinking liquids but has not eating much. She was using MiraLAX and other bowel regimen therefore she was going bathroom more often. She had 4 times bowel movement today. She denied fever, chills, headache, nausea, vomiting him a chest pain, shortness of breath, dysuria, urgency. ED Course: Treated with IV fluid. Admitted for further evaluation. Labs unremarkable. Patient was found to have episode of tachycardia. As per ER physician the pulse of 35 recorded in the computer is not true."  Hospital Course:   Summary of her active problems in the hospital is as following. 1. Syncope and collapse Orthostatic hypotension Tachy-brady syndrome. ? MAT ? SVT Accelerated hypertension. Dehydration in the setting of diarrhea caused by stool softeners. Negative head CT scan, chest x-ray, urinalysis, TSH level Orthostatic BP dropped down to 128 to 85 systolic. Given IV hydration, with improvement in orthostasis without any symptoms. Tele shows tachy-brady syndrome, Cardiology was consulted and they have consulted EP. EP does not feel that her tachy-brady syndrome to be associated with her syncope.  Recommend to Continue lopressor.  PT, OT evaluation recommended home health Echocardiogram does not show any WMA. EF normal Compression stockings and midodrine.  2. Hypertension: Stop amlodipine, Imdur. Lopressor added due to  SVT  3. History of coronary artery disease status post PCI:  Continue aspirin, statin. No chest pain Troponin trend does not suggest any ACS. Echocardiogram also unremarkable.  4  History of multiple myeloma on chemotherapy outpatient:  Recommended to follow up with oncologist after discharge.  5 Gastritis and history of acid reflux:  No active issues now. Continue Protonix. Cardiac diet ordered.  6 History of anxiety depression:  Mood is stable. Continue home medications.  7. Left lower quadrant pain Extensive workup including multiple x-rays, MRI abdomen with and without contrast, 2 CAT scans, gastric emptying panel, HIDA scan has been negative Patient has received often on narcotic pain medications from ER and only found to be having constipation on the imaging. Avoid narcotics. IV toradol scheduled. Stop miralax. Start Colace when necessary. initiate a trial of cymbalta.   All other chronic medical condition were stable during the hospitalization.  Patient was seen by physical therapy, who recommended home health, which was arranged by Education officer, museum and case Freight forwarder. On the day of the discharge the patient's [place], and no other acute medical condition were reported by patient. the patient was felt safe to be discharge at home with home health.  Procedures and Results:  Echocardiogram   Consultations:  Cardiology   EP  DISCHARGE MEDICATION: Current Discharge Medication List    START taking these medications   Details  DULoxetine (CYMBALTA) 20 MG capsule Take 1 capsule (20 mg total) by mouth daily. Qty: 30 capsule, Refills: 0    midodrine (PROAMATINE) 2.5 MG tablet Take 1 tablet (2.5 mg total) by mouth 2 (two) times daily with a meal. Qty: 30 tablet, Refills: 0      CONTINUE these medications which have CHANGED   Details  docusate sodium (COLACE) 100 MG capsule Take 1 capsule (100 mg total) by mouth 2 (two) times daily as needed for mild constipation. Qty: 10 capsule, Refills: 0    metoprolol tartrate (LOPRESSOR) 25 MG tablet Take 0.5 tablets (12.5 mg total) by mouth 2 (two) times daily. Qty: 30 tablet, Refills: 0        CONTINUE these medications which have NOT CHANGED   Details  acyclovir (ZOVIRAX) 400 MG tablet TAKE 1 TABLET (400 MG TOTAL) BY MOUTH 2 (TWO) TIMES DAILY. Qty: 60 tablet, Refills: 2    Ascorbic Acid (VITAMIN C) 1000 MG tablet Take 1,000 mg by mouth daily.     aspirin EC 81 MG tablet Take 81 mg by mouth daily.    calcium carbonate (OS-CAL - DOSED IN MG OF ELEMENTAL CALCIUM) 1250 (500 Ca) MG tablet Take 1 tablet (500 mg of elemental calcium total) by mouth daily with breakfast. Qty: 30 tablet, Refills: 3    Cholecalciferol (VITAMIN D PO) Take 1 capsule by mouth daily.    dexamethasone (DECADRON) 4 MG tablet TAKE 40 MG (10 TABLETS) ONCE A WEEK ON DAY OF VELCADE INJECTION Refills: 2    !! feeding supplement, ENSURE ENLIVE, (ENSURE ENLIVE) LIQD Take 237 mLs by mouth 2 (two) times daily between meals. Qty: 237 mL, Refills: 30    FLUoxetine (PROZAC) 10 MG capsule Take 10 mg by mouth at bedtime.     ondansetron (ZOFRAN) 4 MG tablet Take 1 tablet (4 mg total) by mouth every 8 (eight) hours as needed for nausea or vomiting. Qty: 20 tablet, Refills: 0    pantoprazole (PROTONIX) 40 MG tablet Take 1 tablet (40 mg total) by mouth 2 (two) times daily before a meal. Qty: 60 tablet,  Refills: 0    pravastatin (PRAVACHOL) 40 MG tablet Take 40 mg by mouth every evening. Refills: 1    prochlorperazine (COMPAZINE) 5 MG tablet Take 1 tablet (5 mg total) by mouth every 6 (six) hours as needed for nausea or vomiting. Qty: 30 tablet, Refills: 0    traMADol (ULTRAM) 50 MG tablet Take 0.5-1 tablets (25-50 mg total) by mouth every 8 (eight) hours as needed. Qty: 20 tablet, Refills: 0   Associated Diagnoses: Multiple myeloma, remission status unspecified ([place])    !! feeding supplement (BOOST / RESOURCE BREEZE) LIQD Take 1 Container by mouth 3 (three) times daily between meals. Qty: 237 mL, Refills: 30     !! - Potential duplicate medications found. Please discuss with provider.    STOP taking these  medications     amLODipine (NORVASC) 10 MG tablet      isosorbide mononitrate (IMDUR) 60 MG 24 hr tablet      KLOR-CON M10 10 MEQ tablet      polyethylene glycol (MIRALAX / GLYCOLAX) packet        Allergies  Allergen Reactions  . Sulfa Antibiotics Rash   Discharge Instructions    Diet general    Complete by:  As directed    Discharge instructions    Complete by:  As directed    It is important that you read following instructions as well as go over your medication list with RN to help you understand your care after this hospitalization.  Discharge Instructions: Please follow-up with PCP in one week  Please request your primary care physician to go over all Hospital Tests and Procedure/Radiological results at the follow up,  Please get all Hospital records sent to your PCP by signing hospital release before you go home.   Do not drive, operating heavy machinery, perform activities at heights, swimming or participation in water activities or provide baby sitting services as your were admitted for syncope; until you have been seen by Primary Care Physician or a Neurologist and advised to do so again. Do not take more than prescribed Pain, Sleep and Anxiety Medications. You were cared for by a hospitalist during your hospital stay. If you have any questions about your discharge medications or the care you received while you were in the hospital after you are discharged, you can call the unit and ask to speak with the hospitalist on call if the hospitalist that took care of you is not available.  Once you are discharged, your primary care physician will handle any further medical issues. Please note that NO REFILLS for any discharge medications will be authorized once you are discharged, as it is imperative that you return to your primary care physician (or establish a relationship with a primary care physician if you do not have one) for your aftercare needs so that they can reassess  your need for medications and monitor your lab values. You Must read complete instructions/literature along with all the possible adverse reactions/side effects for all the Medicines you take and that have been prescribed to you. Take any new Medicines after you have completely understood and accept all the possible adverse reactions/side effects. Wear Seat belts while driving. If you have smoked or chewed Tobacco in the last 2 yrs please stop smoking and/or stop any Recreational drug use.   Driving Restrictions    Complete by:  As directed    Do not drive until cleared by cardiology   Increase activity slowly    Complete by:  As directed      Discharge Exam: Filed Weights   12/07/16 0512 12/08/16 0450 12/09/16 0703  Weight: 65.8 kg (145 lb) 59.9 kg (132 lb) 63.3 kg (139 lb 9.5 oz)   Vitals:   12/09/16 0703 12/09/16 1300  BP: (!) 150/82   Pulse: (!) 54   Resp:    Temp: 98.7 F (37.1 C) 98.3 F (36.8 C)   General: Appear in no distress, no Rash; Oral Mucosa moist. Cardiovascular: S1 and S2 Present, no Murmur, no JVD Respiratory: Bilateral Air entry present and Clear to Auscultation, no Crackles, no wheezes Abdomen: Bowel Sound present, Soft and no tenderness Extremities: no Pedal edema, no calf tenderness Neurology: Grossly no focal neuro deficit.  The results of significant diagnostics from this hospitalization (including imaging, microbiology, ancillary and laboratory) are listed below for reference.    Significant Diagnostic Studies: X-ray Chest Pa And Lateral  Result Date: 12/07/2016 CLINICAL DATA:  Syncope EXAM: CHEST  2 VIEW COMPARISON:  Chest CT from 12/31/2011, CXR 11/14/2016 FINDINGS: The heart is normal in size. There is aortic atherosclerosis without aneurysm. Right paratracheal soft tissue prominence is consistent with tortuous vessels based on prior chest CT. The lungs are clear. No acute osseous abnormality is noted. IMPRESSION: No active cardiopulmonary disease.  Electronically Signed   By: Ashley Royalty M.D.   On: 12/07/2016 01:32   Dg Abd 1 View  Result Date: 12/08/2016 CLINICAL DATA:  Nausea and generalized abdominal pain. EXAM: ABDOMEN - 1 VIEW COMPARISON:  11/23/2016 FINDINGS: Gas pattern is unremarkable without evidence of ileus or obstruction. Two renal vascular stents again noted. Ordinary lower lumbar degenerative changes. IMPRESSION: No acute finding. Electronically Signed   By: Nelson Chimes M.D.   On: 12/08/2016 09:00   Ct Head Wo Contrast  Result Date: 12/07/2016 CLINICAL DATA:  Syncope x2, multiple myeloma, hypertension, hyperlipidemia. EXAM: CT HEAD WITHOUT CONTRAST TECHNIQUE: Contiguous axial images were obtained from the base of the skull through the vertex without intravenous contrast. COMPARISON:  None. FINDINGS: BRAIN: Mild superficial atrophy with minimal chronic appearing small vessel ischemic disease of periventricular white matter. No acute intracranial hemorrhage, intra-axial mass nor extra-axial fluid. No acute large vascular territory infarction. VASCULAR: Moderate calcific atherosclerosis of the carotid siphons. SKULL: No skull fracture. Mottled lytic appearance of the bony calvarium in keeping with multiple myeloma. No significant scalp soft tissue swelling. SINUSES/ORBITS: The mastoid air-cells are clear. The included paranasal sinuses are well-aerated.The included ocular globes and orbital contents are non-suspicious. OTHER: None. IMPRESSION: Cerebral atrophy with chronic appearing small vessel ischemic disease of periventricular white matter. No acute intracranial abnormality. Mottled lytic appearance of the bony calvarium consistent with the patient's history of multiple myeloma. Electronically Signed   By: Ashley Royalty M.D.   On: 12/07/2016 01:29   Mr Abdomen W Wo Contrast  Result Date: 11/18/2016 CLINICAL DATA:  Evaluate right renal lesion seen on recent CT and ultrasound examinations. EXAM: MRI ABDOMEN WITHOUT AND WITH CONTRAST  TECHNIQUE: Multiplanar multisequence MR imaging of the abdomen was performed both before and after the administration of intravenous contrast. CONTRAST:  27m MULTIHANCE GADOBENATE DIMEGLUMINE 529 MG/ML IV SOLN COMPARISON:  CT scan 10/27/2016 and ultrasound 11/10/2016 FINDINGS: Lower chest: The lung bases are grossly clear. No worrisome pulmonary lesions. No pleural or pericardial effusion. Hepatobiliary: No focal hepatic lesions or intrahepatic biliary dilatation. The gallbladder is normal. No common bile duct dilatation. The portal and hepatic veins are normal. Pancreas:  No mass, inflammation or ductal dilatation. Spleen:  Normal size.  No focal lesions. Adrenals/Urinary Tract:  The adrenal glands are unremarkable. There are multiple small bilateral renal cysts. The lower pole right renal lesion demonstrates increased T1 signal intensity and no contrast enhancement. This is consistent with a benign hemorrhagic or proteinaceous cyst. The small interpolar lesion involving the left kidney is also a small hemorrhagic or proteinaceous cyst. No worrisome renal lesions. No hydronephrosis. Stomach/Bowel: Visualized portions within the abdomen are unremarkable. Vascular/Lymphatic: Normal caliber aorta. Moderate atherosclerotic calcifications. Calcifications noted at the major branch vessel ostia. Bilateral renal artery stents are noted. No aneurysm or dissection. The major venous structures are patent. No mesenteric or retroperitoneal mass or adenopathy. Other: No ascites or abdominal wall hernia. No subcutaneous lesions. Musculoskeletal: Small scattered hemangiomas are noted. IMPRESSION: 1. Multiple bilateral renal cysts. Most of these are simple cysts. The 2 lesions in question on the CT scan are both hemorrhagic or proteinaceous cysts. No worrisome contrast enhancement. 2. No acute abdominal findings, mass lesions or adenopathy. Electronically Signed   By: Marijo Sanes M.D.   On: 11/18/2016 10:07   Ct Abdomen Pelvis  W Contrast  Result Date: 11/19/2016 CLINICAL DATA:  Left lower quadrant pain.  Nausea and vomiting. EXAM: CT ABDOMEN AND PELVIS WITH CONTRAST TECHNIQUE: Multidetector CT imaging of the abdomen and pelvis was performed using the standard protocol following bolus administration of intravenous contrast. CONTRAST:  11m ISOVUE-300 IOPAMIDOL (ISOVUE-300) INJECTION 61% COMPARISON:  Abdominal MRI 11/18/2016. CT abdomen and pelvis 10/27/2016. FINDINGS: Lower chest: Subsegmental atelectasis in the lung bases. At most trace pleural fluid bilaterally. Partially visualize coronary artery atherosclerosis. Hepatobiliary: No focal liver abnormality is seen. No gallstones, gallbladder wall thickening, or biliary dilatation. Pancreas: Unremarkable. Spleen: Unremarkable. Adrenals/Urinary Tract: Unremarkable adrenal glands. Small bilateral renal lesions more fully characterized on yesterday's MRI. No hydronephrosis. No renal calculi. Unremarkable bladder. Stomach/Bowel: Small sliding hiatal hernia. No evidence of bowel obstruction. Scattered left-sided colonic diverticula without evidence of diverticulitis. Prior appendectomy. Vascular/Lymphatic: Abdominal aortic atherosclerosis without aneurysm. Bilateral renal stents which are grossly patent. No enlarged lymph nodes in the abdomen or pelvis. Reproductive: Status post hysterectomy. No adnexal masses. Other: No intraperitoneal free fluid. No abdominal wall mass or hernia. Musculoskeletal: 1.6 cm lytic lesion in the right aspect of the T12 vertebral body, enlarged from 07/24/2015 an new from 12/31/2011. Unchanged L1 vertebral body hemangioma. Grade 1 retrolisthesis of T12 on L1 and L1 on L2. Multilevel disc degeneration, advanced at T12-L1 and L5-S1. IMPRESSION: 1. No acute abnormality identified in the abdomen or pelvis. 2. 1.6 cm T12 vertebral body lesion, enlarged from 2016. This likely reflects patient's known multiple myeloma, although a metastasis from a non-hematologic  malignancy is also possible. 3. Aortic atherosclerosis. Electronically Signed   By: ALogan BoresM.D.   On: 11/19/2016 15:05   Nm Hepato W/eject Fract  Result Date: 11/25/2016 CLINICAL DATA:  Abdominal pain, nausea and vomiting over the last 2 days EXAM: NUCLEAR MEDICINE HEPATOBILIARY IMAGING WITH GALLBLADDER EF TECHNIQUE: Sequential images of the abdomen were obtained out to 60 minutes following intravenous administration of radiopharmaceutical. After slow intravenous infusion of 1.4 micrograms Cholecystokinin, gallbladder ejection fraction was determined. RADIOPHARMACEUTICALS:  5.4 mCi [id number] IV COMPARISON:  CT abdomen and pelvis of 11/19/2016 and ultrasound abdomen of 11/10/2016 FINDINGS: Prompt uptake and biliary excretion of activity by the liver is seen. Gallbladder activity is visualized, consistent with patency of cystic duct. Biliary activity passes into small bowel, consistent with patent common bile duct. Calculated gallbladder ejection fraction is 78%. (At 60 min, normal ejection fraction  is greater than 40%.) IMPRESSION: 1. Normal nuclear medicine hepatobiliary scan. 2. Normal gallbladder ejection fraction of 78%. Electronically Signed   By: Ivar Drape M.D.   On: 11/25/2016 16:12   Dg Abdomen Acute W/chest  Result Date: 11/15/2016 CLINICAL DATA:  Initial evaluation for acute left lower quadrant pain for 3 weeks. History of multiple myeloma. EXAM: DG ABDOMEN ACUTE W/ 1V CHEST COMPARISON:  Prior CT from 10/27/2016. FINDINGS: Cardiac and mediastinal silhouettes are within normal limits. Scattered atheromatous plaque noted within the aortic arch. Lungs normally inflated. Linear opacities of the left lung base most compatible with atelectasis. No other focal airspace disease. No pulmonary edema or pleural effusion. No pneumothorax. Bowel gas pattern within normal limits without evidence for obstruction or ileus. No abnormal bowel wall thickening. Moderate to large amount of retained stool  within the right colon. No soft tissue mass or abnormal calcification. Vascular stent overlies the left upper abdomen. No acute osseous abnormality. IMPRESSION: 1. Nonobstructive bowel gas pattern with no radiographic evidence for acute intra-abdominal process. 2. Moderate to large amount of retained stool within the right colon. 3. Mild left basilar atelectasis. No other active cardiopulmonary disease. 4. Aortic atherosclerosis. Electronically Signed   By: Jeannine Boga M.D.   On: 11/15/2016 00:07   Dg Abd Portable 1v  Result Date: 11/23/2016 CLINICAL DATA:  Vomiting.  Right lower quadrant pain. EXAM: PORTABLE ABDOMEN - 1 VIEW COMPARISON:  November 14, 2016 FINDINGS: Vascular stent in the left upper quadrant. No free air, portal venous gas, or pneumatosis. No bowel obstruction. Fecal loading in the colon. Mottled appearance to the bones, likely due to the reported history of multiple myeloma. IMPRESSION: Fecal loading in the colon.  No other acute abnormalities. Electronically Signed   By: Dorise Bullion III M.D   On: 11/23/2016 15:53    Microbiology: No results found for this or any previous visit (from the past 240 hour(s)).   Labs: CBC:  Recent Labs Lab 12/03/16 1145 12/06/16 1615 12/07/16 0605 12/09/16 0407  WBC 4.1 5.1 4.5 3.8*  NEUTROABS 3.5  --   --   --   HGB 14.3 15.2* 14.1 12.6  HCT 42.2 46.3* 43.8 38.8  MCV 92.3 91.5 91.8 90.7  PLT 376 273 243 741   Basic Metabolic Panel:  Recent Labs Lab 12/03/16 1145 12/06/16 1615 12/07/16 0605 12/08/16 0931 12/09/16 0407  NA 139 139 141 140 138  K 3.9 3.9 3.7 3.7 4.1  CL  --  103 109 107 109  CO2 21* 24 21* 21* 19*  GLUCOSE 123 119* 96 111* 97  BUN 13._0 <5* <5*  CREATININE 1.2* 1.06* 0.81 0.73 0.72  CALCIUM 9.9 10.0 9.1 9.7 8.8*  MG  --   --   --  1.4* 1.6*   Liver Function Tests:  Recent Labs Lab 12/03/16 1145  AST 17  ALT 16  ALKPHOS 78  BILITOT 0.66  PROT 7.2  6.8  ALBUMIN 4.4   No results for  input(s): LIPASE, AMYLASE in the last 168 hours. No results for input(s): AMMONIA in the last 168 hours. Cardiac Enzymes:  Recent Labs Lab 12/06/16 2005 12/07/16 0226 12/07/16 0605  TROPONINI 0.08* 0.09* 0.08*   BNP (last 3 results)  Recent Labs  06/12/16 2204  BNP 679.5*   CBG:  Recent Labs Lab 12/07/16 0647 12/07/16 1130 12/08/16 0643 12/09/16 0656  GLUCAP 81 108* 92 86   Time spent: 30 minutes  Signed:  Berle Mull  [facility]  12/09/2016  , 4:15 PM

## 2016-12-09 NOTE — Progress Notes (Signed)
Abigail [name] to be D/C'd Home per MD order. Discussed with the patient and all questions fully answered.  Allergies as of 12/09/2016      Reactions   Sulfa Antibiotics Rash      Medication List    STOP taking these medications   amLODipine 10 MG tablet Commonly known as:  NORVASC   isosorbide mononitrate 60 MG 24 hr tablet Commonly known as:  IMDUR   KLOR-CON M10 10 MEQ tablet Generic drug:  potassium chloride   polyethylene glycol packet Commonly known as:  MIRALAX / GLYCOLAX     TAKE these medications   acyclovir 400 MG tablet Commonly known as:  ZOVIRAX TAKE 1 TABLET (400 MG TOTAL) BY MOUTH 2 (TWO) TIMES DAILY.   aspirin EC 81 MG tablet Take 81 mg by mouth daily.   calcium carbonate 1250 (500 Ca) MG tablet Commonly known as:  OS-CAL - dosed in mg of elemental calcium Take 1 tablet (500 mg of elemental calcium total) by mouth daily with breakfast.   dexamethasone 4 MG tablet Commonly known as:  DECADRON TAKE 40 MG (10 TABLETS) ONCE A WEEK ON DAY OF VELCADE INJECTION   docusate sodium 100 MG capsule Commonly known as:  COLACE Take 1 capsule (100 mg total) by mouth 2 (two) times daily as needed for mild constipation. What changed:  when to take this  reasons to take this   DULoxetine 20 MG capsule Commonly known as:  CYMBALTA Take 1 capsule (20 mg total) by mouth daily. Start taking on:  12/10/2016   feeding supplement Liqd Take 1 Container by mouth 3 (three) times daily between meals.   feeding supplement (ENSURE ENLIVE) Liqd Take 237 mLs by mouth 2 (two) times daily between meals.   FLUoxetine 10 MG capsule Commonly known as:  PROZAC Take 10 mg by mouth at bedtime.   metoprolol tartrate 25 MG tablet Commonly known as:  LOPRESSOR Take 0.5 tablets (12.5 mg total) by mouth 2 (two) times daily.   midodrine 2.5 MG tablet Commonly known as:  PROAMATINE Take 1 tablet (2.5 mg total) by mouth 2 (two) times daily with a meal.   ondansetron 4 MG  tablet Commonly known as:  ZOFRAN Take 1 tablet (4 mg total) by mouth every 8 (eight) hours as needed for nausea or vomiting.   pantoprazole 40 MG tablet Commonly known as:  PROTONIX Take 1 tablet (40 mg total) by mouth 2 (two) times daily before a meal.   pravastatin 40 MG tablet Commonly known as:  PRAVACHOL Take 40 mg by mouth every evening.   prochlorperazine 5 MG tablet Commonly known as:  COMPAZINE Take 1 tablet (5 mg total) by mouth every 6 (six) hours as needed for nausea or vomiting.   traMADol 50 MG tablet Commonly known as:  ULTRAM Take 0.5-1 tablets (25-50 mg total) by mouth every 8 (eight) hours as needed.   vitamin C 1000 MG tablet Take 1,000 mg by mouth daily.   VITAMIN D PO Take 1 capsule by mouth daily.       VVS, Skin clean, dry and intact without evidence of skin break down, no evidence of skin tears noted.  IV catheter discontinued intact. Site without signs and symptoms of complications. Dressing and pressure applied.  An After Visit Summary was printed and given to the patient.  Patient escorted via [place], and D/C home via private auto.  Cyndra Numbers  12/09/2016 6:57 PM

## 2016-12-09 NOTE — Care Management Note (Addendum)
Case Management Note Marvetta Gibbons RN, BSN Unit 2W-Case Manager [phone]  Patient Details  Name: Abigail [name] MRN: [id number] Date of Birth: [birth year]  Subjective/Objective:    Pt admitted with syncope                  Action/Plan: PTA pt lived at home, plan to return home, no recommendations made by PT/OT for f/u services- No CM needs noted for discharge.   Expected Discharge Date:  12/09/16               Expected Discharge Plan:  Home/Self Care  In-House Referral:     Discharge planning Services  CM Consult  Post Acute Care Choice:   home health Choice offered to:   patient  DME Arranged:    n/a DME Agency:   n/a  HH Arranged:   RN, PT [place] Agency:   [place]  Status of Service:  Completed, signed off  If discussed at [place] of Stay Meetings, dates discussed:    Discharge Disposition: home/home health   Additional Comments:  12/09/16- 1530- Marvetta Gibbons RN, CM- pt for d/c home today- on f/u PT eval- recommendations for [facility] services- orders have been placed for HHRN/PT- spoke with pt at bedside- choice offered for [facility] agency- per pt she would like to use [facility] for services- pt also reports that she has a telephonic [employer] nurse that calls her. Per pt she will go stay with her sister- Address: [address]                 [place], [place], [address]                 Phone: [phone]  Referral called to Santiago Glad with [facility] for HHRN/PT- referral accepted for [facility] services.   Dawayne Patricia, RN 12/09/2016, 2:17 PM

## 2016-12-09 NOTE — Progress Notes (Signed)
Subjective:  Patient denies any chest pain or shortness of breath denies any palpitations. No further episodes of syncope. No further episodes of SVT   Objective:  Vital Signs in the last 24 hours: Temp:  [98.1 F (36.7 C)-99.5 F (37.5 C)] 98.7 F (37.1 C) (02/20 0703) Pulse Rate:  [54-107] 54 (02/20 0703) Resp:  [18] 18 (02/19 2005) BP: (119-163)/(82-93) 150/82 (02/20 0703) SpO2:  [100 %] 100 % (02/19 1333) Weight:  [139 lb 9.5 oz (63.3 kg)] 139 lb 9.5 oz (63.3 kg) (02/20 0703)  Intake/Output from previous day: 02/19 0701 - 02/20 0700 In: 3779.2 [P.O.:960; I.V.:2819.2] Out: 1851 [Urine:1850; Stool:1] Intake/Output from this shift: No intake/output data recorded.  Physical Exam: Neck: no adenopathy, no carotid bruit, no JVD and supple, symmetrical, trachea midline Lungs: clear to auscultation bilaterally Heart: regular rate and rhythm, S1, S2 normal and Soft systolic murmur noted no S3 gallop Abdomen: soft, non-tender; bowel sounds normal; no masses,  no organomegaly Extremities: extremities normal, atraumatic, no cyanosis or edema  Lab Results:  Recent Labs  12/07/16 0605 12/09/16 0407  WBC 4.5 3.8*  HGB 14.1 12.6  PLT 243 210    Recent Labs  12/08/16 0931 12/09/16 0407  NA 140 138  K 3.7 4.1  CL 107 109  CO2 21* 19*  GLUCOSE 111* 97  BUN <5* <5*  CREATININE 0.73 0.72    Recent Labs  12/07/16 0226 12/07/16 0605  TROPONINI 0.09* 0.08*   Hepatic Function Panel No results for input(s): PROT, ALBUMIN, AST, ALT, ALKPHOS, BILITOT, BILIDIR, IBILI in the last 72 hours.  Recent Labs  12/07/16 0605  CHOL 220*   No results for input(s): PROTIME in the last 72 hours.  Imaging: Imaging results have been reviewed and Dg Abd 1 View  Result Date: 12/08/2016 CLINICAL DATA:  Nausea and generalized abdominal pain. EXAM: ABDOMEN - 1 VIEW COMPARISON:  11/23/2016 FINDINGS: Gas pattern is unremarkable without evidence of ileus or obstruction. Two renal vascular  stents again noted. Ordinary lower lumbar degenerative changes. IMPRESSION: No acute finding. Electronically Signed   By: Mark  Shogry M.D.   On: 12/08/2016 09:00    Cardiac Studies:  Assessment/Plan:  Status post syncope 2. Probably secondary to orthostatic hypotension Status post SVT/multifocal atrial tachycardia Minimally elevated troponin I secondary to above doubt significant MI Hypertension Hyperlipidemia Coronary artery disease status post PCI to left circumflex and remote past History of bilateral renal stenting Multiple myeloma GERD History of gastritis/peptic ulcer disease Plan Continue present management Okay to discharge from cardiac point of view Follow-up with  Dr. Kadakia in one to 2 weeks  LOS: 1 day    Harwani, Mohan 12/09/2016, 9:53 AM    

## 2016-12-11 ENCOUNTER — Ambulatory Visit: Payer: Medicare HMO

## 2016-12-11 ENCOUNTER — Ambulatory Visit (HOSPITAL_BASED_OUTPATIENT_CLINIC_OR_DEPARTMENT_OTHER): Payer: Medicare HMO | Admitting: Nurse Practitioner

## 2016-12-11 ENCOUNTER — Other Ambulatory Visit (HOSPITAL_BASED_OUTPATIENT_CLINIC_OR_DEPARTMENT_OTHER): Payer: Medicare HMO

## 2016-12-11 VITALS — BP 121/80 | HR 62 | Temp 98.5°F | Resp 18 | Ht 65.0 in | Wt 146.4 lb

## 2016-12-11 DIAGNOSIS — R627 Adult failure to thrive: Secondary | ICD-10-CM | POA: Diagnosis not present

## 2016-12-11 DIAGNOSIS — C9001 Multiple myeloma in remission: Secondary | ICD-10-CM

## 2016-12-11 DIAGNOSIS — C9000 Multiple myeloma not having achieved remission: Secondary | ICD-10-CM

## 2016-12-11 DIAGNOSIS — D630 Anemia in neoplastic disease: Secondary | ICD-10-CM | POA: Diagnosis not present

## 2016-12-11 DIAGNOSIS — I951 Orthostatic hypotension: Secondary | ICD-10-CM | POA: Diagnosis not present

## 2016-12-11 DIAGNOSIS — E860 Dehydration: Secondary | ICD-10-CM | POA: Diagnosis not present

## 2016-12-11 LAB — CBC WITH DIFFERENTIAL/PLATELET
BASO%: 0.8 % (ref 0.0–2.0)
Basophils Absolute: 0 10*3/uL (ref 0.0–0.1)
EOS%: 0.8 % (ref 0.0–7.0)
Eosinophils Absolute: 0 10*3/uL (ref 0.0–0.5)
HCT: 43 % (ref 34.8–46.6)
HGB: 14.1 g/dL (ref 11.6–15.9)
LYMPH%: 14.2 % (ref 14.0–49.7)
MCH: 30.2 pg (ref 25.1–34.0)
MCHC: 32.9 g/dL (ref 31.5–36.0)
MCV: 91.8 fL (ref 79.5–101.0)
MONO#: 0.3 10*3/uL (ref 0.1–0.9)
MONO%: 7.3 % (ref 0.0–14.0)
NEUT%: 76.9 % — ABNORMAL HIGH (ref 38.4–76.8)
NEUTROS ABS: 3.3 10*3/uL (ref 1.5–6.5)
PLATELETS: 199 10*3/uL (ref 145–400)
RBC: 4.68 10*6/uL (ref 3.70–5.45)
RDW: 17.2 % — ABNORMAL HIGH (ref 11.2–14.5)
WBC: 4.2 10*3/uL (ref 3.9–10.3)
lymph#: 0.6 10*3/uL — ABNORMAL LOW (ref 0.9–3.3)

## 2016-12-11 LAB — COMPREHENSIVE METABOLIC PANEL
ALT: 17 U/L (ref 0–55)
AST: 13 U/L (ref 5–34)
Albumin: 4 g/dL (ref 3.5–5.0)
Alkaline Phosphatase: 70 U/L (ref 40–150)
Anion Gap: 10 mEq/L (ref 3–11)
BILIRUBIN TOTAL: 0.62 mg/dL (ref 0.20–1.20)
BUN: 17.6 mg/dL (ref 7.0–26.0)
CO2: 22 meq/L (ref 22–29)
Calcium: 10.3 mg/dL (ref 8.4–10.4)
Chloride: 108 mEq/L (ref 98–109)
Creatinine: 1.6 mg/dL — ABNORMAL HIGH (ref 0.6–1.1)
EGFR: 38 mL/min/{1.73_m2} — ABNORMAL LOW (ref >90)
GLUCOSE: 119 mg/dL (ref 70–140)
Potassium: 4.2 mEq/L (ref 3.5–5.1)
SODIUM: 140 meq/L (ref 136–145)
TOTAL PROTEIN: 6.6 g/dL (ref 6.4–8.3)

## 2016-12-11 NOTE — Progress Notes (Addendum)
Abigail [name]   Diagnosis:  Multiple myeloma  INTERVAL HISTORY:   Abigail [name] returns as scheduled. Treatment for myeloma has been on hold since January due to multiple issues including urinary retention, abdominal pain and nausea/vomiting. Treatment was held again last week due to continued weight loss. She was hospitalized 12/06/2016 through 12/09/2016 with syncope felt to be secondary to dehydration in the setting of diarrhea.  She continues to feel "weak and tired". No nausea or vomiting. No diarrhea. She is lightheaded/dizzy with standing. No bleeding. No mouth sores. No shortness breath. No fever. She has occasional abdominal pain.  Objective:  Vital signs in last 24 hours:  Blood pressure 115/73, pulse 62, temperature 98.5 F (36.9 C), temperature source Oral, resp. rate 18, height _0  (1.651 m), weight 146 lb 6.4 oz (66.4 kg), SpO2 100 %.    HEENT: No thrush or ulcers. Resp: Lungs clear bilaterally. Cardio: Regular rate and rhythm. GI: Abdomen soft and nontender. No hepatomegaly. Vascular: No leg edema.   Lab Results:  Lab Results  Component Value Date   WBC 4.2 12/11/2016   HGB 14.1 12/11/2016   HCT 43.0 12/11/2016   MCV 91.8 12/11/2016   PLT 199 12/11/2016   NEUTROABS 3.3 12/11/2016    Imaging:  No results found.  Medications: I have reviewed the patient's current medications.  Assessment/Plan: 1. Multiple myeloma, IgG lambda, bone marrow biopsy 06/15/2016 confirmed multiple myeloma; cytogenetics by FISHshow +11, +12, 13q-  Elevated serum free lambda light chains  Lambda light chain proteinuria  Lytic bone lesions on a bone survey 06/13/2016  Initiation of weekly Velcade/Decadron 06/19/2016  Serum light chains improved 07/31/2016  Initiation of Revlimid 11/03/20172 weeks on/2 weeks off  Serum M spike and IgG significantly improved 09/25/2016  Velcade held on 10/30/2016 due to urinary retention  2. Severe  anemia secondary to #1-markedly improved  3. Diffuse lytic bone lesions secondary to multiple myeloma, status post Zometa 09/18/2016(plan to continue every 3 months)  4. History of coronary artery disease/myocardial infarction  5. History of colon polyps  6. Bilateral leg edema 09/04/2016. Negative bilateral venous Doppler 09/05/2016.  7. Mild periorbital edema 09/18/2016, question related to early stye formation, question Velcade related chalazia.Improved.  8. CT scan 10/27/2016 with a new right kidney evaluated by urology  9. Urinary retention 10/28/2016 status post evaluation in the emergency department. Urinalysis negative for signs of infection 10/30/2016. Velcade held. Resolved.  10. Abdominal pain, nausea, vomiting. Etiology unclear. Upper endoscopy 11/18/2016 with findings of reflux esophagitis, benign appearing esophageal stenosis, small hiatal hernia and erythematous duodenopathy. CT abdomen/pelvis 11/19/2016 with no acute abnormality.   Disposition: Abigail [name] is a 71 year old woman with multiple myeloma. Most recent myeloma labs from last week showed a significant response to treatment. Treatment for the myeloma has been on hold since early to mid January due to initial urinary retention, abdominal pain and nausea/vomiting. The symptoms have resolved. The plan was to resume treatment today. However, she presents today with failure to thrive of unclear etiology, question dehydration. She will increase fluid intake. We are checking a cortisol level. We will continue to hold treatment for myeloma. She will return for a follow-up visit in one week.  Patient seen with Dr. Benay Spice. 25 minutes were spent face-to-face at today's visit with the majority of that time involved in counseling/coordination of care.    [name] ANP/GNP-BC   12/11/2016  3:43 PM  This was a shared visit with [name]. The etiology of  Abigail [name] failure to thrive is unclear. She was admitted  with syncope 12/06/2016, felt to be secondary to orthostatic hypotension. She was noted have SVT during the hospital admission .  She has limited oral intake. We recommended she increase her intake of fluids. We checked a cortisol level. She will return for an office visit prior to resuming systemic therapy next week.  Julieanne Manson, M.D.

## 2016-12-12 ENCOUNTER — Telehealth: Payer: Self-pay | Admitting: Oncology

## 2016-12-12 LAB — CORTISOL: Cortisol: 9 ug/dL

## 2016-12-12 NOTE — Telephone Encounter (Signed)
sw pt re 3/1 appts at 0915 per LOS

## 2016-12-14 ENCOUNTER — Other Ambulatory Visit: Payer: Self-pay | Admitting: Oncology

## 2016-12-18 ENCOUNTER — Other Ambulatory Visit (HOSPITAL_BASED_OUTPATIENT_CLINIC_OR_DEPARTMENT_OTHER): Payer: Medicare HMO

## 2016-12-18 ENCOUNTER — Ambulatory Visit (HOSPITAL_BASED_OUTPATIENT_CLINIC_OR_DEPARTMENT_OTHER): Payer: Medicare HMO | Admitting: Nurse Practitioner

## 2016-12-18 ENCOUNTER — Inpatient Hospital Stay: Payer: Medicare HMO

## 2016-12-18 VITALS — BP 106/87 | HR 76 | Temp 97.7°F | Resp 18 | Ht 65.0 in | Wt 140.5 lb

## 2016-12-18 VITALS — BP 144/75 | HR 60

## 2016-12-18 DIAGNOSIS — C9000 Multiple myeloma not having achieved remission: Secondary | ICD-10-CM

## 2016-12-18 DIAGNOSIS — D630 Anemia in neoplastic disease: Secondary | ICD-10-CM

## 2016-12-18 DIAGNOSIS — R5383 Other fatigue: Secondary | ICD-10-CM

## 2016-12-18 DIAGNOSIS — R627 Adult failure to thrive: Secondary | ICD-10-CM

## 2016-12-18 DIAGNOSIS — C9001 Multiple myeloma in remission: Secondary | ICD-10-CM

## 2016-12-18 DIAGNOSIS — E860 Dehydration: Secondary | ICD-10-CM

## 2016-12-18 LAB — COMPREHENSIVE METABOLIC PANEL
ALT: 16 U/L (ref 0–55)
AST: 20 U/L (ref 5–34)
Albumin: 3.9 g/dL (ref 3.5–5.0)
Alkaline Phosphatase: 66 U/L (ref 40–150)
Anion Gap: 11 mEq/L (ref 3–11)
BUN: 16.8 mg/dL (ref 7.0–26.0)
CALCIUM: 9.9 mg/dL (ref 8.4–10.4)
CHLORIDE: 106 meq/L (ref 98–109)
CO2: 24 mEq/L (ref 22–29)
CREATININE: 1.7 mg/dL — AB (ref 0.6–1.1)
EGFR: 36 mL/min/{1.73_m2} — ABNORMAL LOW (ref >90)
Glucose: 138 mg/dl (ref 70–140)
Potassium: 4.9 mEq/L (ref 3.5–5.1)
Sodium: 140 mEq/L (ref 136–145)
Total Bilirubin: 0.64 mg/dL (ref 0.20–1.20)
Total Protein: 6.4 g/dL (ref 6.4–8.3)

## 2016-12-18 LAB — CBC WITH DIFFERENTIAL/PLATELET
BASO%: 1.3 % (ref 0.0–2.0)
Basophils Absolute: 0.1 10*3/uL (ref 0.0–0.1)
EOS%: 2.6 % (ref 0.0–7.0)
Eosinophils Absolute: 0.1 10*3/uL (ref 0.0–0.5)
HEMATOCRIT: 42.8 % (ref 34.8–46.6)
HEMOGLOBIN: 14 g/dL (ref 11.6–15.9)
LYMPH%: 22.4 % (ref 14.0–49.7)
MCH: 30.2 pg (ref 25.1–34.0)
MCHC: 32.8 g/dL (ref 31.5–36.0)
MCV: 92 fL (ref 79.5–101.0)
MONO#: 0.7 10*3/uL (ref 0.1–0.9)
MONO%: 12 % (ref 0.0–14.0)
NEUT#: 3.4 10*3/uL (ref 1.5–6.5)
NEUT%: 61.7 % (ref 38.4–76.8)
Platelets: 221 10*3/uL (ref 145–400)
RBC: 4.65 10*6/uL (ref 3.70–5.45)
RDW: 17.6 % — AB (ref 11.2–14.5)
WBC: 5.5 10*3/uL (ref 3.9–10.3)
lymph#: 1.2 10*3/uL (ref 0.9–3.3)

## 2016-12-18 LAB — TSH: TSH: 0.576 m[IU]/L (ref 0.308–3.960)

## 2016-12-18 MED ORDER — SODIUM CHLORIDE 0.9 % IV SOLN
INTRAVENOUS | Status: AC
  Administered 2016-12-18: 12:00:00 via INTRAVENOUS

## 2016-12-18 NOTE — Progress Notes (Signed)
RN visit for IV fluids. 

## 2016-12-18 NOTE — Patient Instructions (Signed)
Dehydration, Adult Dehydration is a condition in which there is not enough fluid or water in the body. This happens when you lose more fluids than you take in. Important organs, such as the kidneys, brain, and heart, cannot function without a proper amount of fluids. Any loss of fluids from the body can lead to dehydration. Dehydration can range from mild to severe. This condition should be treated right away to prevent it from becoming severe. What are the causes? This condition may be caused by:  Vomiting.  Diarrhea.  Excessive sweating, such as from heat exposure or exercise.  Not drinking enough fluid, especially:  When ill.  While doing activity that requires a lot of energy.  Excessive urination.  Fever.  Infection.  Certain medicines, such as medicines that cause the body to lose excess fluid (diuretics).  Inability to access safe drinking water.  Reduced physical ability to get adequate water and food. What increases the risk? This condition is more likely to develop in people:  Who have a poorly controlled long-term (chronic) illness, such as diabetes, heart disease, or kidney disease.  Who are age 65 or older.  Who are disabled.  Who live in a place with high altitude.  Who play endurance sports. What are the signs or symptoms? Symptoms of mild dehydration may include:   Thirst.  Dry lips.  Slightly dry mouth.  Dry, warm skin.  Dizziness. Symptoms of moderate dehydration may include:   Very dry mouth.  Muscle cramps.  Dark urine. Urine may be the color of tea.  Decreased urine production.  Decreased tear production.  Heartbeat that is irregular or faster than normal (palpitations).  Headache.  Light-headedness, especially when you stand up from a sitting position.  Fainting (syncope). Symptoms of severe dehydration may include:   Changes in skin, such as:  Cold and clammy skin.  Blotchy (mottled) or pale skin.  Skin that does  not quickly return to normal after being lightly pinched and released (poor skin turgor).  Changes in body fluids, such as:  Extreme thirst.  No tear production.  Inability to sweat when body temperature is high, such as in hot weather.  Very little urine production.  Changes in vital signs, such as:  Weak pulse.  Pulse that is more than 100 beats a minute when sitting still.  Rapid breathing.  Low blood pressure.  Other changes, such as:  Sunken eyes.  Cold hands and feet.  Confusion.  Lack of energy (lethargy).  Difficulty waking up from sleep.  Short-term weight loss.  Unconsciousness. How is this diagnosed? This condition is diagnosed based on your symptoms and a physical exam. Blood and urine tests may be done to help confirm the diagnosis. How is this treated? Treatment for this condition depends on the severity. Mild or moderate dehydration can often be treated at home. Treatment should be started right away. Do not wait until dehydration becomes severe. Severe dehydration is an emergency and it needs to be treated in a hospital. Treatment for mild dehydration may include:   Drinking more fluids.  Replacing salts and minerals in your blood (electrolytes) that you may have lost. Treatment for moderate dehydration may include:   Drinking an oral rehydration solution (ORS). This is a drink that helps you replace fluids and electrolytes (rehydrate). It can be found at pharmacies and retail stores. Treatment for severe dehydration may include:   Receiving fluids through an IV tube.  Receiving an electrolyte solution through a feeding tube that is   passed through your nose and into your stomach (nasogastric tube, or NG tube).  Correcting any abnormalities in electrolytes.  Treating the underlying cause of dehydration. Follow these instructions at home:  If directed by your health care provider, drink an ORS:  Make an ORS by following instructions on the  package.  Start by drinking small amounts, about  cup (120 mL) every 5-10 minutes.  Slowly increase how much you drink until you have taken the amount recommended by your health care provider.  Drink enough clear fluid to keep your urine clear or pale yellow. If you were told to drink an ORS, finish the ORS first, then start slowly drinking other clear fluids. Drink fluids such as:  Water. Do not drink only water. Doing that can lead to having too little salt (sodium) in the body (hyponatremia).  Ice chips.  Fruit juice that you have added water to (diluted fruit juice).  Low-calorie sports drinks.  Avoid:  Alcohol.  Drinks that contain a lot of sugar. These include high-calorie sports drinks, fruit juice that is not diluted, and soda.  Caffeine.  Foods that are greasy or contain a lot of fat or sugar.  Take over-the-counter and prescription medicines only as told by your health care provider.  Do not take sodium tablets. This can lead to having too much sodium in the body (hypernatremia).  Eat foods that contain a healthy balance of electrolytes, such as bananas, oranges, potatoes, tomatoes, and spinach.  Keep all follow-up visits as told by your health care provider. This is important. Contact a health care provider if:  You have abdominal pain that:  Gets worse.  Stays in one area (localizes).  You have a rash.  You have a stiff neck.  You are more irritable than usual.  You are sleepier or more difficult to wake up than usual.  You feel weak or dizzy.  You feel very thirsty.  You have urinated only a small amount of very dark urine over 6-8 hours. Get help right away if:  You have symptoms of severe dehydration.  You cannot drink fluids without vomiting.  Your symptoms get worse with treatment.  You have a fever.  You have a severe headache.  You have vomiting or diarrhea that:  Gets worse.  Does not go away.  You have blood or green matter  (bile) in your vomit.  You have blood in your stool. This may cause stool to look black and tarry.  You have not urinated in 6-8 hours.  You faint.  Your heart rate while sitting still is over 100 beats a minute.  You have trouble breathing. This information is not intended to replace advice given to you by your health care provider. Make sure you discuss any questions you have with your health care provider. Document Released: 10/06/2005 Document Revised: 05/02/2016 Document Reviewed: 11/30/2015 Elsevier Interactive Patient Education  2017 Elsevier Inc.  

## 2016-12-18 NOTE — Progress Notes (Addendum)
[facility] OFFICE PROGRESS NOTE   Diagnosis:  Multiple myeloma  INTERVAL HISTORY:   Ms. Fuente returns as scheduled. Myeloma treatment has been on hold since mid January. Most recently she has had failure to thrive of unclear etiology and treatment has remained on hold. Appetite remains poor. She reports very good fluid intake. She has lost weight since her last visit. No nausea or vomiting. Bowels moving regularly. No nausea or vomiting. No fever. At times she notes lightheadedness/dizziness with position change.  Objective:  Vital signs in last 24 hours:  Blood pressure 106/87, pulse 76, temperature 97.7 F (36.5 C), resp. rate 18, height 5' 5" (1.651 m), weight 140 lb 8 oz (63.7 kg), SpO2 96 %.    HEENT: White coating over tongue. Resp: Lungs clear bilaterally. Cardio: Regular rate and rhythm. GI: Abdomen soft and nontender. No organomegaly. No mass. Vascular: No leg edema. Neuro: Alert and oriented.    Lab Results:  Lab Results  Component Value Date   WBC 5.5 12/18/2016   HGB 14.0 12/18/2016   HCT 42.8 12/18/2016   MCV 92.0 12/18/2016   PLT 221 12/18/2016   NEUTROABS 3.4 12/18/2016   Creatinine 1.7, BUN 16.8, calcium 9.9  Imaging:  No results found.  Medications: I have reviewed the patient's current medications.  Assessment/Plan: 1. Multiple myeloma, IgG lambda, bone marrow biopsy 06/15/2016 confirmed multiple myeloma; cytogenetics by FISHshow +11, +12, 13q-  Elevated serum free lambda light chains  Lambda light chain proteinuria  Lytic bone lesions on a bone survey 06/13/2016  Initiation of weekly Velcade/Decadron 06/19/2016  Serum light chains improved 07/31/2016  Initiation of Revlimid 11/03/20172 weeks on/2 weeks off  Serum M spike and IgG significantly improved 09/25/2016  Velcade held on 10/30/2016 due to urinary retention  2. Severe anemia secondary to #1-markedly improved  3. Diffuse lytic bone lesions secondary to  multiple myeloma, status post Zometa 09/18/2016(plan to continue every 3 months)  4. History of coronary artery disease/myocardial infarction  5. History of colon polyps  6. Bilateral leg edema 09/04/2016. Negative bilateral venous Doppler 09/05/2016.  7. Mild periorbital edema 09/18/2016, question related to early stye formation, question Velcade related chalazia.Improved.  8. CT scan 10/27/2016 with a new right kidney mass evaluated by urology; MRI abdomen 11/18/2016 multiple bilateral renal cysts. Most of these are simple cysts. The 2 lesions in question on the CT scan are both hemorrhagic or proteinaceous cysts. No worrisome contrast enhancement.  9. Urinary retention 10/28/2016 status post evaluation in the emergency department. Urinalysis negative for signs of infection 10/30/2016. Velcade held. Resolved.  10. Abdominal pain, nausea, vomiting.Etiology unclear. Upper endoscopy 11/18/2016 with findings of reflux esophagitis, benign appearing esophageal stenosis, small hiatal hernia and erythematous duodenopathy. CT abdomen/pelvis 11/19/2016 with no acute abnormality.    Disposition: Ms. Mondesir is a 71 year old woman with multiple myeloma. Most recent myeloma labs have shown a significant response to treatment. She continues to have failure to thrive of unclear etiology. Creatinine remains elevated. Cortisol level from last week was normal. We will repeat a cortisol level today and also obtain a thyroid panel. She will receive intravenous hydration. We will continue to hold treatment for the myeloma. She will return for a follow-up visit and repeat chemistry panel in one week.  Patient seen with Dr. Benay Spice. 25 minutes were spent face-to-face at today's visit with the majority of that time involved in counseling/coordination of care.    [name] ANP/GNP-BC   12/18/2016  10:20 AM  This was a shared  visit with [name]. Ms. Dills has persistent failure to thrive. She has  been maintained off of treatment for myeloma and the myeloma appears to be in remission. We checked a repeat cortisol level and thyroid panel today. She will return for an office visit next week.  Julieanne Manson, M.D.

## 2016-12-19 LAB — T3 UPTAKE
FREE THYROXINE INDEX: 2.3 (ref 1.2–4.9)
T3 UPTAKE RATIO: 30 % (ref 24–39)

## 2016-12-19 LAB — T4, FREE: T4,Free(Direct): 1.61 ng/dL (ref 0.82–1.77)

## 2016-12-19 LAB — CORTISOL: CORTISOL: 16.2 ug/dL

## 2016-12-19 LAB — T4: Thyroxine (T4): 7.6 ug/dL (ref 4.5–12.0)

## 2016-12-20 ENCOUNTER — Other Ambulatory Visit: Payer: Self-pay

## 2016-12-20 ENCOUNTER — Encounter (HOSPITAL_COMMUNITY): Payer: Self-pay | Admitting: Emergency Medicine

## 2016-12-20 ENCOUNTER — Emergency Department (HOSPITAL_COMMUNITY): Payer: Medicare HMO

## 2016-12-20 ENCOUNTER — Emergency Department (HOSPITAL_COMMUNITY)
Admission: EM | Admit: 2016-12-20 | Discharge: 2016-12-20 | Disposition: A | Discharge status: Home / Self Care | Payer: Medicare HMO | Attending: Emergency Medicine | Admitting: Emergency Medicine

## 2016-12-20 DIAGNOSIS — Z955 Presence of coronary angioplasty implant and graft: Secondary | ICD-10-CM | POA: Diagnosis not present

## 2016-12-20 DIAGNOSIS — I252 Old myocardial infarction: Secondary | ICD-10-CM | POA: Insufficient documentation

## 2016-12-20 DIAGNOSIS — Z79899 Other long term (current) drug therapy: Secondary | ICD-10-CM | POA: Insufficient documentation

## 2016-12-20 DIAGNOSIS — I471 Supraventricular tachycardia: Secondary | ICD-10-CM | POA: Diagnosis not present

## 2016-12-20 DIAGNOSIS — Z7982 Long term (current) use of aspirin: Secondary | ICD-10-CM | POA: Diagnosis not present

## 2016-12-20 DIAGNOSIS — F17210 Nicotine dependence, cigarettes, uncomplicated: Secondary | ICD-10-CM | POA: Insufficient documentation

## 2016-12-20 DIAGNOSIS — I2510 Atherosclerotic heart disease of native coronary artery without angina pectoris: Secondary | ICD-10-CM | POA: Insufficient documentation

## 2016-12-20 DIAGNOSIS — I10 Essential (primary) hypertension: Secondary | ICD-10-CM | POA: Insufficient documentation

## 2016-12-20 DIAGNOSIS — R000 Tachycardia, unspecified: Secondary | ICD-10-CM | POA: Diagnosis present

## 2016-12-20 LAB — BASIC METABOLIC PANEL
Anion gap: 8 (ref 5–15)
BUN: 15 mg/dL (ref 6–20)
CALCIUM: 9.8 mg/dL (ref 8.9–10.3)
CO2: 23 mmol/L (ref 22–32)
CREATININE: 1.23 mg/dL — AB (ref 0.44–1.00)
Chloride: 109 mmol/L (ref 101–111)
GFR calc non Af Amer: 44 mL/min — ABNORMAL LOW (ref >60)
GFR, EST AFRICAN AMERICAN: 51 mL/min — AB (ref >60)
Glucose, Bld: 116 mg/dL — ABNORMAL HIGH (ref 65–99)
Potassium: 4.2 mmol/L (ref 3.5–5.1)
Sodium: 140 mmol/L (ref 135–145)

## 2016-12-20 LAB — CBC
HCT: 42.4 % (ref 36.0–46.0)
Hemoglobin: 13.6 g/dL (ref 12.0–15.0)
MCH: 29.6 pg (ref 26.0–34.0)
MCHC: 32.1 g/dL (ref 30.0–36.0)
MCV: 92.4 fL (ref 78.0–100.0)
PLATELETS: 186 10*3/uL (ref 150–400)
RBC: 4.59 MIL/uL (ref 3.87–5.11)
RDW: 16.6 % — ABNORMAL HIGH (ref 11.5–15.5)
WBC: 6 10*3/uL (ref 4.0–10.5)

## 2016-12-20 LAB — I-STAT TROPONIN, ED: TROPONIN I, POC: 0.02 ng/mL (ref 0.00–0.08)

## 2016-12-20 MED ORDER — SODIUM CHLORIDE 0.9 % IV BOLUS (SEPSIS)
500.0000 mL | Freq: Once | INTRAVENOUS | Status: AC
  Administered 2016-12-20: 500 mL via INTRAVENOUS

## 2016-12-20 MED ORDER — METOPROLOL TARTRATE 25 MG PO TABS: 25.0000 mg | ORAL_TABLET | Freq: Two times a day (BID) | ORAL | 0 refills | Status: DC

## 2016-12-20 NOTE — ED Notes (Signed)
In XR

## 2016-12-20 NOTE — ED Provider Notes (Signed)
[facility] DEPT Provider Note   CSN: [id number] Arrival date & time: 12/20/16  1540     History   Chief Complaint Chief Complaint  Patient presents with  . Tachycardia    HPI Abigail [name] is a 71 y.o. female.  HPI Patient sent in from home health nurse after found to be tachycardic. Recent admission to hospital and found to have a SVT versus atrial tachycardia. Started on beta blocker for it. Patient cannot tell when she is in it. She states that she is feeling a little weak but that is not unusual for either. Good oral intake. No dysuria. No chest pain. No cough.   Past Medical History:  Diagnosis Date  . Anemia 07/2015  . Bilateral renal artery stenosis ([place]) 1999   s/p stenting  . Coronary artery disease 1999  . Depression   . Diverticulosis   . Duodenitis   . Gastric erosions   . Gastric ulcer   . GERD (gastroesophageal reflux disease)   . Heart murmur   . History of blood transfusion ~ 03/2016   "low HgB; practically nonexistent"  . Hyperlipidemia   . Hypertension   . MI (myocardial infarction) 1999  . Multiple myeloma ([place])   . Retinal hemorrhage, right eye   . Schatzki's ring   . Tubular adenoma of colon     Patient Active Problem List   Diagnosis Date Noted  . Orthostatic hypotension 12/08/2016  . Syncope 12/08/2016  . Syncope and collapse 12/06/2016  . 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 Essential hypertension   . Nausea and vomiting in adult patient   . Erosive esophagitis   . Protein-calorie malnutrition, severe 11/19/2016  . LLQ pain   . LUQ abdominal pain   . Intractable nausea and vomiting 11/17/2016  . Abdominal pain 11/05/2016  . Multiple myeloma ([place]) 06/18/2016  . Malnutrition of moderate degree 06/14/2016  . 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 Exertional dyspnea 11/01/2015  . Coronary atherosclerosis of native coronary artery 11/01/2015  . Shortness of breath 11/01/2015  . Chest pain 12/31/2011     Past Surgical History:  Procedure Laterality Date  . APPENDECTOMY    . BACK SURGERY    . CATARACT EXTRACTION W/ INTRAOCULAR LENS IMPLANT Right 2014  . CORONARY ANGIOPLASTY WITH STENT PLACEMENT  1999  . ESOPHAGOGASTRODUODENOSCOPY N/A 11/03/2015   Procedure: ESOPHAGOGASTRODUODENOSCOPY (EGD);  Surgeon: Carol Ada, MD;  [facility]COPY;  Service: Endoscopy;  Laterality: N/A;  . ESOPHAGOGASTRODUODENOSCOPY (EGD) WITH PROPOFOL N/A 11/18/2016   Procedure: ESOPHAGOGASTRODUODENOSCOPY (EGD) WITH PROPOFOL;  Surgeon: Ladene Artist, MD;  Location: [facility] ENDOSCOPY;  Service: Endoscopy;  Laterality: N/A;  . LUMBAR [place]    . RENAL ARTERY STENT  1999   bil RAS so ? bil vs unilateral stents  . RETINAL LASER PROCEDURE Right 2012   "bleeding"  . TONSILLECTOMY    . TOTAL ABDOMINAL HYSTERECTOMY      OB History    No data available       Home Medications    Prior to Admission medications   Medication Sig Start Date End Date Taking? Authorizing Provider  acyclovir (ZOVIRAX) 400 MG tablet TAKE 1 TABLET (400 MG TOTAL) BY MOUTH 2 (TWO) TIMES DAILY. 08/29/16  Yes Ladell Pier, MD  amLODipine (NORVASC) 10 MG tablet Take 10 mg by mouth every evening.  12/10/16  Yes Historical Provider, MD  Ascorbic Acid (VITAMIN C) 1000 MG tablet Take 1,000 mg by mouth daily.  Yes Historical Provider, MD  aspirin EC 81 MG tablet Take 81 mg by mouth daily.   Yes Historical Provider, MD  calcium carbonate (OS-CAL - DOSED IN MG OF ELEMENTAL CALCIUM) 1250 (500 Ca) MG tablet Take 1 tablet by mouth daily with breakfast.   Yes Historical Provider, MD  Cholecalciferol (VITAMIN D PO) Take 1 capsule by mouth daily.   Yes Historical Provider, MD  DULoxetine (CYMBALTA) 20 MG capsule Take 1 capsule (20 mg total) by mouth daily. 12/10/16  Yes Pranav M Patel, MD  feeding supplement, ENSURE ENLIVE, (ENSURE ENLIVE) LIQD Take 237 mLs by mouth 2 (two) times daily between meals. 11/27/16  Yes Ralph A Nettey, MD  FLUoxetine  (PROZAC) 10 MG capsule Take 10 mg by mouth at bedtime.    Yes Historical Provider, MD  midodrine (PROAMATINE) 2.5 MG tablet Take 1 tablet (2.5 mg total) by mouth 2 (two) times daily with a meal. 12/09/16  Yes Pranav M Patel, MD  pantoprazole (PROTONIX) 40 MG tablet Take 1 tablet (40 mg total) by mouth 2 (two) times daily before a meal. 11/21/16  Yes Anand D Hongalgi, MD  polyethylene glycol (MIRALAX / GLYCOLAX) packet Take 17 g by mouth daily as needed for moderate constipation.    Yes Historical Provider, MD  pravastatin (PRAVACHOL) 40 MG tablet Take 40 mg by mouth every evening. 06/03/15  Yes Historical Provider, MD  traMADol (ULTRAM) 50 MG tablet Take 0.5-1 tablets (25-50 mg total) by mouth every 8 (eight) hours as needed. 12/03/16  Yes Lisa K Thomas, NP  dexamethasone (DECADRON) 4 MG tablet TAKE 40 MG (10 TABLETS) ONCE A WEEK ON DAY OF VELCADE INJECTION 10/02/16   Historical Provider, MD  metoprolol tartrate (LOPRESSOR) 25 MG tablet Take 1 tablet (25 mg total) by mouth 2 (two) times daily. 12/20/16   Nathan Pickering, MD    Family History Family History  Problem Relation Age of Onset  . Colon cancer Neg Hx     Social History Social History  Substance Use Topics  . Smoking status: Current Every Day Smoker    Packs/day: 0.50    Years: 48.00    Types: Cigarettes  . Smokeless tobacco: Never Used  . Alcohol use No     Allergies   Sulfa antibiotics   Review of Systems Review of Systems  Constitutional: Positive for fatigue. Negative for appetite change.  HENT: Negative for congestion.   Respiratory: Negative for shortness of breath.   Cardiovascular: Negative for chest pain and palpitations.  Gastrointestinal: Negative for abdominal pain.  Genitourinary: Negative for dyspareunia and frequency.  Musculoskeletal: Negative for back pain.  Neurological: Negative for headaches.  Hematological: Negative for adenopathy.  Psychiatric/Behavioral: Negative for confusion.     Physical  Exam Updated Vital Signs BP 151/85   Pulse 66   Temp 98.8 F (37.1 C)   Resp 15   Ht 5' 5" (1.651 m)   Wt 140 lb (63.5 kg)   SpO2 99%   BMI 23.30 kg/m   Physical Exam  Constitutional: She appears well-developed.  HENT:  Head: Atraumatic.  Eyes: Pupils are equal, round, and reactive to light.  Neck: Neck supple.  Cardiovascular: Normal rate.   Abdominal: Soft.  Musculoskeletal: She exhibits no tenderness.  Neurological: She is alert.  Skin: Capillary refill takes less than 2 seconds.     ED Treatments / Results  Labs (all labs ordered are listed, but only abnormal results are displayed) Labs Reviewed  BASIC METABOLIC PANEL - Abnormal; Notable for the   following:       Result Value   Glucose, Bld 116 (*)    Creatinine, Ser 1.23 (*)    GFR calc non Af Amer 44 (*)    GFR calc Af Amer 51 (*)    All other components within normal limits  CBC - Abnormal; Notable for the following:    RDW 16.6 (*)    All other components within normal limits  I-STAT TROPOININ, ED    EKG  EKG Interpretation  Date/Time:  Saturday December 20 2016 15:49:48 EST Ventricular Rate:  148 PR Interval:  112 QRS Duration: 72 QT Interval:  310 QTC Calculation: 486 R Axis:   49 Text Interpretation:  Sinus tachycardia Nonspecific ST and T wave abnormality Abnormal ECG Confirmed by PICKERING  MD, NATHAN ([id number]) on 12/20/2016 4:07:02 PM       Radiology Dg Chest 2 View  Result Date: 12/20/2016 CLINICAL DATA:  Reason for exam: atrial fibrillation. Pt came to [facility] for the same concern 2 weeks ago (HR was abnormal) and then HR was stabilized. Today, same symptoms came back and pt felt an abnormal rhythm in Hr. Medical hx: cancer, HTN, multiple myeloma. EXAM: CHEST  2 VIEW COMPARISON:  12/06/2016 FINDINGS: The heart size and mediastinal contours are within normal limits. Both lungs are clear. No acute skeletal abnormalities. Note is made of stable disc height loss at T12-L1. IMPRESSION: No active  cardiopulmonary disease. Electronically Signed   By: Elizabeth  Brown M.D.   On: 12/20/2016 18:13    Procedures Procedures (including critical care time)  Medications Ordered in ED Medications  sodium chloride 0.9 % bolus 500 mL (0 mLs Intravenous Stopped 12/20/16 1927)     Initial Impression / Assessment and Plan / ED Course  I have reviewed the triage vital signs and the nursing notes.  Pertinent labs & imaging results that were available during my care of the patient were reviewed by me and considered in my medical decision making (see chart for details).     Patient presents with heart racing. History of same with either MAT or a atrial tachycardia. Has been on metoprolol for it. Does not appear to be bradycardic here. Mildly orthostatic feels better after some IV fluids. Will discharge home to follow with cardiology. Discussed with Dr. harwani and will increase metoprolol to 25 twice a day.  Final Clinical Impressions(s) / ED Diagnoses   Final diagnoses:  Atrial tachycardia (HCC)    New Prescriptions Current Discharge Medication List       Nathan Pickering, MD 12/20/16 2039  

## 2016-12-20 NOTE — ED Triage Notes (Signed)
Pt reports that her home health nurse came out today to check vitals. Home health nurse reports that pt heart rate was elevated and told pt to come to ED for evaluation. Pt reports that she was admitted her for same recently.

## 2016-12-22 ENCOUNTER — Encounter (HOSPITAL_COMMUNITY): Payer: Self-pay | Admitting: General Practice

## 2016-12-22 ENCOUNTER — Other Ambulatory Visit: Payer: Self-pay

## 2016-12-22 ENCOUNTER — Inpatient Hospital Stay (HOSPITAL_COMMUNITY)
Admission: AD | Admit: 2016-12-22 | Discharge: 2017-01-02 | DRG: 247 | Disposition: A | Discharge status: Skilled Nursing Facility | Payer: Medicare HMO | Source: Ambulatory Visit | Attending: Cardiovascular Disease | Admitting: Cardiovascular Disease

## 2016-12-22 DIAGNOSIS — I252 Old myocardial infarction: Secondary | ICD-10-CM

## 2016-12-22 DIAGNOSIS — I249 Acute ischemic heart disease, unspecified: Secondary | ICD-10-CM | POA: Diagnosis present

## 2016-12-22 DIAGNOSIS — C9000 Multiple myeloma not having achieved remission: Secondary | ICD-10-CM | POA: Diagnosis present

## 2016-12-22 DIAGNOSIS — I2510 Atherosclerotic heart disease of native coronary artery without angina pectoris: Secondary | ICD-10-CM | POA: Diagnosis present

## 2016-12-22 DIAGNOSIS — E860 Dehydration: Secondary | ICD-10-CM | POA: Diagnosis present

## 2016-12-22 DIAGNOSIS — E440 Moderate protein-calorie malnutrition: Secondary | ICD-10-CM | POA: Diagnosis present

## 2016-12-22 DIAGNOSIS — I471 Supraventricular tachycardia: Secondary | ICD-10-CM | POA: Diagnosis not present

## 2016-12-22 DIAGNOSIS — D62 Acute posthemorrhagic anemia: Secondary | ICD-10-CM | POA: Diagnosis not present

## 2016-12-22 DIAGNOSIS — R42 Dizziness and giddiness: Secondary | ICD-10-CM

## 2016-12-22 DIAGNOSIS — T50905A Adverse effect of unspecified drugs, medicaments and biological substances, initial encounter: Secondary | ICD-10-CM | POA: Diagnosis not present

## 2016-12-22 DIAGNOSIS — I952 Hypotension due to drugs: Secondary | ICD-10-CM | POA: Diagnosis not present

## 2016-12-22 DIAGNOSIS — I214 Non-ST elevation (NSTEMI) myocardial infarction: Principal | ICD-10-CM | POA: Diagnosis present

## 2016-12-22 DIAGNOSIS — I4719 Other supraventricular tachycardia: Secondary | ICD-10-CM

## 2016-12-22 DIAGNOSIS — N182 Chronic kidney disease, stage 2 (mild): Secondary | ICD-10-CM | POA: Diagnosis present

## 2016-12-22 DIAGNOSIS — I472 Ventricular tachycardia: Secondary | ICD-10-CM | POA: Diagnosis present

## 2016-12-22 DIAGNOSIS — E876 Hypokalemia: Secondary | ICD-10-CM | POA: Diagnosis not present

## 2016-12-22 DIAGNOSIS — Z955 Presence of coronary angioplasty implant and graft: Secondary | ICD-10-CM

## 2016-12-22 DIAGNOSIS — I129 Hypertensive chronic kidney disease with stage 1 through stage 4 chronic kidney disease, or unspecified chronic kidney disease: Secondary | ICD-10-CM | POA: Diagnosis present

## 2016-12-22 DIAGNOSIS — I495 Sick sinus syndrome: Secondary | ICD-10-CM | POA: Diagnosis present

## 2016-12-22 DIAGNOSIS — E785 Hyperlipidemia, unspecified: Secondary | ICD-10-CM | POA: Diagnosis present

## 2016-12-22 DIAGNOSIS — F329 Major depressive disorder, single episode, unspecified: Secondary | ICD-10-CM | POA: Diagnosis present

## 2016-12-22 DIAGNOSIS — Z6822 Body mass index (BMI) 22.0-22.9, adult: Secondary | ICD-10-CM

## 2016-12-22 DIAGNOSIS — K219 Gastro-esophageal reflux disease without esophagitis: Secondary | ICD-10-CM | POA: Diagnosis present

## 2016-12-22 DIAGNOSIS — F17210 Nicotine dependence, cigarettes, uncomplicated: Secondary | ICD-10-CM | POA: Diagnosis present

## 2016-12-22 DIAGNOSIS — I10 Essential (primary) hypertension: Secondary | ICD-10-CM | POA: Diagnosis present

## 2016-12-22 HISTORY — DX: Chronic kidney disease, stage 3 unspecified: N18.30

## 2016-12-22 HISTORY — DX: Supraventricular tachycardia: I47.1

## 2016-12-22 HISTORY — DX: Anxiety disorder, unspecified: F41.9

## 2016-12-22 HISTORY — DX: Sick sinus syndrome: I49.5

## 2016-12-22 HISTORY — DX: Other supraventricular tachycardia: I47.19

## 2016-12-22 HISTORY — DX: Chronic kidney disease, stage 3 (moderate): N18.3

## 2016-12-22 LAB — COMPREHENSIVE METABOLIC PANEL
ALT: 18 U/L (ref 14–54)
AST: 27 U/L (ref 15–41)
Albumin: 3.9 g/dL (ref 3.5–5.0)
Alkaline Phosphatase: 54 U/L (ref 38–126)
Anion gap: 13 (ref 5–15)
BILIRUBIN TOTAL: 1 mg/dL (ref 0.3–1.2)
BUN: 6 mg/dL (ref 6–20)
CHLORIDE: 103 mmol/L (ref 101–111)
CO2: 24 mmol/L (ref 22–32)
Calcium: 10 mg/dL (ref 8.9–10.3)
Creatinine, Ser: 0.95 mg/dL (ref 0.44–1.00)
GFR calc Af Amer: >60 mL/min (ref >60)
GFR, EST NON AFRICAN AMERICAN: 60 mL/min — AB (ref >60)
Glucose, Bld: 100 mg/dL — ABNORMAL HIGH (ref 65–99)
Potassium: 3.5 mmol/L (ref 3.5–5.1)
Sodium: 140 mmol/L (ref 135–145)
TOTAL PROTEIN: 6.2 g/dL — AB (ref 6.5–8.1)

## 2016-12-22 LAB — CBC WITH DIFFERENTIAL/PLATELET
Basophils Absolute: 0 10*3/uL (ref 0.0–0.1)
Basophils Relative: 0 %
EOS ABS: 0 10*3/uL (ref 0.0–0.7)
EOS PCT: 0 %
HCT: 44.6 % (ref 36.0–46.0)
Hemoglobin: 14.8 g/dL (ref 12.0–15.0)
LYMPHS ABS: 1.2 10*3/uL (ref 0.7–4.0)
Lymphocytes Relative: 17 %
MCH: 30 pg (ref 26.0–34.0)
MCHC: 33.2 g/dL (ref 30.0–36.0)
MCV: 90.3 fL (ref 78.0–100.0)
Monocytes Absolute: 1 10*3/uL (ref 0.1–1.0)
Monocytes Relative: 15 %
Neutro Abs: 4.7 10*3/uL (ref 1.7–7.7)
Neutrophils Relative %: 68 %
PLATELETS: 227 10*3/uL (ref 150–400)
RBC: 4.94 MIL/uL (ref 3.87–5.11)
RDW: 16.3 % — AB (ref 11.5–15.5)
WBC: 6.9 10*3/uL (ref 4.0–10.5)

## 2016-12-22 LAB — TSH: TSH: 0.481 u[IU]/mL (ref 0.350–4.500)

## 2016-12-22 MED ORDER — SODIUM CHLORIDE 0.9 % IV SOLN
INTRAVENOUS | Status: DC
  Administered 2016-12-22 – 2016-12-24 (×3): via INTRAVENOUS

## 2016-12-22 MED ORDER — PANTOPRAZOLE SODIUM 40 MG PO TBEC
40.0000 mg | DELAYED_RELEASE_TABLET | Freq: Two times a day (BID) | ORAL | Status: DC
  Administered 2016-12-23 – 2017-01-02 (×21): 40 mg via ORAL
  Filled 2016-12-22 (×21): qty 1

## 2016-12-22 MED ORDER — METOPROLOL TARTRATE 50 MG PO TABS
50.0000 mg | ORAL_TABLET | Freq: Two times a day (BID) | ORAL | Status: DC
Start: 2016-12-22 — End: 2016-12-22

## 2016-12-22 MED ORDER — ONDANSETRON HCL 4 MG/2ML IJ SOLN
4.0000 mg | INTRAMUSCULAR | Status: DC | PRN
  Administered 2016-12-22: 4 mg via INTRAVENOUS
  Filled 2016-12-22 (×3): qty 2

## 2016-12-22 MED ORDER — METOPROLOL TARTRATE 50 MG PO TABS
50.0000 mg | ORAL_TABLET | Freq: Two times a day (BID) | ORAL | Status: DC
  Administered 2016-12-22 – 2017-01-01 (×18): 50 mg via ORAL
  Filled 2016-12-22 (×10): qty 1
  Filled 2016-12-22: qty 2
  Filled 2016-12-22 (×3): qty 1
  Filled 2016-12-22: qty 2
  Filled 2016-12-22 (×4): qty 1

## 2016-12-22 MED ORDER — DULOXETINE HCL 20 MG PO CPEP
20.0000 mg | ORAL_CAPSULE | Freq: Every day | ORAL | Status: DC
  Administered 2016-12-23 – 2017-01-02 (×9): 20 mg via ORAL
  Filled 2016-12-22 (×11): qty 1

## 2016-12-22 MED ORDER — HEPARIN SODIUM (PORCINE) 5000 UNIT/ML IJ SOLN
5000.0000 [IU] | Freq: Three times a day (TID) | INTRAMUSCULAR | Status: DC
Start: 2016-12-22 — End: 2016-12-28
  Administered 2016-12-22 – 2016-12-28 (×12): 5000 [IU] via SUBCUTANEOUS
  Filled 2016-12-22 (×12): qty 1

## 2016-12-22 MED ORDER — POLYETHYLENE GLYCOL 3350 17 G PO PACK
17.0000 g | PACK | Freq: Every day | ORAL | Status: DC | PRN
  Administered 2016-12-26: 17 g via ORAL
  Filled 2016-12-22: qty 1

## 2016-12-22 MED ORDER — CALCIUM CARBONATE 1250 (500 CA) MG PO TABS: 1.0000 | ORAL_TABLET | Freq: Every day | ORAL | Status: DC

## 2016-12-22 MED ORDER — ENSURE ENLIVE PO LIQD
237.0000 mL | Freq: Two times a day (BID) | ORAL | Status: DC
  Administered 2016-12-23: 237 mL via ORAL

## 2016-12-22 MED ORDER — PRAVASTATIN SODIUM 40 MG PO TABS
40.0000 mg | ORAL_TABLET | Freq: Every evening | ORAL | Status: DC
  Administered 2016-12-22 – 2017-01-02 (×12): 40 mg via ORAL
  Filled 2016-12-22 (×12): qty 1

## 2016-12-22 MED ORDER — TRAMADOL HCL 50 MG PO TABS
50.0000 mg | ORAL_TABLET | Freq: Three times a day (TID) | ORAL | Status: DC | PRN
  Administered 2016-12-22 – 2017-01-02 (×10): 50 mg via ORAL
  Filled 2016-12-22 (×10): qty 1

## 2016-12-22 MED ORDER — ASPIRIN EC 81 MG PO TBEC
81.0000 mg | DELAYED_RELEASE_TABLET | Freq: Every day | ORAL | Status: DC
  Administered 2016-12-23: 81 mg via ORAL
  Filled 2016-12-22: qty 1

## 2016-12-22 MED ORDER — METOPROLOL TARTRATE 5 MG/5ML IV SOLN: 5.0000 mg | Freq: Once | INTRAVENOUS | Status: DC

## 2016-12-22 NOTE — H&P (Signed)
Referring Physician: Nolene Ebbs, MD  Abigail [name] is an 71 y.o. female.                       Chief Complaint: Dizziness  HPI: 71 year old female with PMH of anemia, CAD, GERD, Hyperlipidemia, hypertension and Multiple myeloma has lost several pounds of weight since started chemotherapy with Velcade injections and Revlimid oral therapy. She presented to office with palpitations and dizziness off and on. EKG showed paroxysmal atrial tachycardia. She is placed in hospital for further work up and monitoring.  Past Medical History:  Diagnosis Date  . Anemia 07/2015  . Bilateral renal artery stenosis ([place]) 1999   s/p stenting  . Coronary artery disease 1999  . Depression   . Diverticulosis   . Duodenitis   . Gastric erosions   . Gastric ulcer   . GERD (gastroesophageal reflux disease)   . Heart murmur   . History of blood transfusion ~ 03/2016   "low HgB; practically nonexistent"  . Hyperlipidemia   . Hypertension   . MI (myocardial infarction) 1999  . Multiple myeloma ([place])   . Retinal hemorrhage, right eye   . Schatzki's ring   . Tubular adenoma of colon       Past Surgical History:  Procedure Laterality Date  . APPENDECTOMY    . BACK SURGERY    . CATARACT EXTRACTION W/ INTRAOCULAR LENS IMPLANT Right 2014  . CORONARY ANGIOPLASTY WITH STENT PLACEMENT  1999  . ESOPHAGOGASTRODUODENOSCOPY N/A 11/03/2015   Procedure: ESOPHAGOGASTRODUODENOSCOPY (EGD);  Surgeon: Carol Ada, MD;  Location: [facility] ENDOSCOPY;  Service: Endoscopy;  Laterality: N/A;  . ESOPHAGOGASTRODUODENOSCOPY (EGD) WITH PROPOFOL N/A 11/18/2016   Procedure: ESOPHAGOGASTRODUODENOSCOPY (EGD) WITH PROPOFOL;  Surgeon: Ladene Artist, MD;  Location: [facility] ENDOSCOPY;  Service: Endoscopy;  Laterality: N/A;  . LUMBAR [place]    . RENAL ARTERY STENT  1999   bil RAS so ? bil vs unilateral stents  . RETINAL LASER PROCEDURE Right 2012   "bleeding"  . TONSILLECTOMY    . TOTAL ABDOMINAL HYSTERECTOMY      Family History   Problem Relation Age of Onset  . Colon cancer Neg Hx    Social History:  reports that she has been smoking Cigarettes.  She has a 24.00 pack-year smoking history. She has never used smokeless tobacco. She reports that she does not drink alcohol or use drugs.  Allergies:  Allergies  Allergen Reactions  . Sulfa Antibiotics Rash    Medications Prior to Admission  Medication Sig Dispense Refill  . acyclovir (ZOVIRAX) 400 MG tablet TAKE 1 TABLET (400 MG TOTAL) BY MOUTH 2 (TWO) TIMES DAILY. 60 tablet 2  . amLODipine (NORVASC) 10 MG tablet Take 10 mg by mouth every evening.     . Ascorbic Acid (VITAMIN C) 1000 MG tablet Take 1,000 mg by mouth daily.     Marland Kitchen aspirin EC 81 MG tablet Take 81 mg by mouth daily.    . calcium carbonate (OS-CAL - DOSED IN MG OF ELEMENTAL CALCIUM) 1250 (500 Ca) MG tablet Take 1 tablet by mouth daily with breakfast.    . Cholecalciferol (VITAMIN D PO) Take 1 capsule by mouth daily.    Marland Kitchen dexamethasone (DECADRON) 4 MG tablet TAKE 40 MG (10 TABLETS) ONCE A WEEK ON DAY OF VELCADE INJECTION  2  . DULoxetine (CYMBALTA) 20 MG capsule Take 1 capsule (20 mg total) by mouth daily. 30 capsule 0  . feeding supplement, ENSURE ENLIVE, (ENSURE  ENLIVE) LIQD Take 237 mLs by mouth 2 (two) times daily between meals. 237 mL 30  . FLUoxetine (PROZAC) 10 MG capsule Take 10 mg by mouth at bedtime.     . metoprolol tartrate (LOPRESSOR) 25 MG tablet Take 1 tablet (25 mg total) by mouth 2 (two) times daily. 30 tablet 0  . midodrine (PROAMATINE) 2.5 MG tablet Take 1 tablet (2.5 mg total) by mouth 2 (two) times daily with a meal. 30 tablet 0  . pantoprazole (PROTONIX) 40 MG tablet Take 1 tablet (40 mg total) by mouth 2 (two) times daily before a meal. 60 tablet 0  . polyethylene glycol (MIRALAX / GLYCOLAX) packet Take 17 g by mouth daily as needed for moderate constipation.     . pravastatin (PRAVACHOL) 40 MG tablet Take 40 mg by mouth every evening.  1  . traMADol (ULTRAM) 50 MG tablet Take 0.5-1  tablets (25-50 mg total) by mouth every 8 (eight) hours as needed. 20 tablet 0    No results found for this or any previous visit (from the past 48 hour(s)). No results found.  Review Of Systems Constitutional: No fever, chills , positive weight loss. Eyes: Positive vision change, Wears glasses. No discharge or pain.. Ears: No hearing loss, No tinnitus. Respiratory: No asthma, COPD, pneumonias or shortness of breath. No hemoptysis. Cardiovascular: Positive chest pain, palpitation. No leg edema. Gastrointestinal: Positive nausea, vomiting. No diarrhea or constipation. No GI bleed. No hepatitis. Genitourinary: No dysuria, hematuria or kidney stone. No incontinance. Neurological: No headache, stroke or seizures.  Psychiatry: No psych facility admission for anxiety, depression or suicide. No detox. Skin: No rash. Musculoskeletal: Positive joint pain. No fibromyalgia. Positive neck pain and back pain. Hematology: H/O of anemia.   Blood pressure 123/78, pulse (!) 149, temperature 98.2 F (36.8 C), temperature source Oral, resp. rate 18, height '5\' 5"'  (1.651 m), weight 62.1 kg (136 lb 14.4 oz), SpO2 98 %. Body mass index is 22.78 kg/m. General appearance: alert, cooperative, appears stated age and mild distress Head: Normocephalic, atraumatic. Eyes: Brown eyes, pink conjunctivae/corneas clear. PERRL, EOM's intact.  Neck: no adenopathy, no carotid bruit, no JVD, supple, symmetrical, trachea midline and thyroid not enlarged. Resp: clear to auscultation bilaterally Cardio: Irregular rate and rhythm, S1, S2 normal, II/VI systolic murmur, no click, rub or gallop GI: soft, non-tender; bowel sounds normal; no masses,  no organomegaly Extremities: No cyanosis or edema Skin: Warm and dry. No rashes or lesions Neurologic: Alert and oriented X 3, normal strength and tone. Normal coordination and slow gait.  Assessment/Plan Dizziness Tachy-Brady syndrome CAD Hypertension Multiple  myeloma GERD Abnormal weight loss CKD, II  IV fluids. Home medications B-blocker and/or diltiazem as needed for heart rate control.   Birdie Riddle, MD  12/22/2016, 7:56 PM

## 2016-12-23 LAB — CBC
HCT: 44 % (ref 36.0–46.0)
Hemoglobin: 14.5 g/dL (ref 12.0–15.0)
MCH: 30 pg (ref 26.0–34.0)
MCHC: 33 g/dL (ref 30.0–36.0)
MCV: 90.9 fL (ref 78.0–100.0)
PLATELETS: 221 10*3/uL (ref 150–400)
RBC: 4.84 MIL/uL (ref 3.87–5.11)
RDW: 16.6 % — ABNORMAL HIGH (ref 11.5–15.5)
WBC: 7.7 10*3/uL (ref 4.0–10.5)

## 2016-12-23 LAB — BASIC METABOLIC PANEL
Anion gap: 9 (ref 5–15)
BUN: 10 mg/dL (ref 6–20)
CHLORIDE: 105 mmol/L (ref 101–111)
CO2: 25 mmol/L (ref 22–32)
CREATININE: 0.86 mg/dL (ref 0.44–1.00)
Calcium: 9.9 mg/dL (ref 8.9–10.3)
GFR calc Af Amer: >60 mL/min (ref >60)
Glucose, Bld: 105 mg/dL — ABNORMAL HIGH (ref 65–99)
Potassium: 3.5 mmol/L (ref 3.5–5.1)
SODIUM: 139 mmol/L (ref 135–145)

## 2016-12-23 LAB — TROPONIN I
TROPONIN I: 0.04 ng/mL — AB (ref <0.03)
Troponin I: 0.06 ng/mL (ref <0.03)
Troponin I: 0.06 ng/mL (ref <0.03)

## 2016-12-23 MED ORDER — CALCIUM CARBONATE 1250 (500 CA) MG PO TABS
1.0000 | ORAL_TABLET | Freq: Every day | ORAL | Status: DC
  Administered 2016-12-23 – 2017-01-02 (×8): 500 mg via ORAL
  Filled 2016-12-23 (×9): qty 1

## 2016-12-23 MED ORDER — AMLODIPINE BESYLATE 5 MG PO TABS
5.0000 mg | ORAL_TABLET | Freq: Every day | ORAL | Status: DC
Start: 2016-12-23 — End: 2017-01-01
  Administered 2016-12-23 – 2017-01-01 (×9): 5 mg via ORAL
  Filled 2016-12-23 (×10): qty 1

## 2016-12-23 MED ORDER — ENSURE ENLIVE PO LIQD
237.0000 mL | Freq: Four times a day (QID) | ORAL | Status: DC
  Administered 2016-12-23 – 2017-01-02 (×18): 237 mL via ORAL
  Filled 2016-12-23 (×9): qty 237

## 2016-12-23 NOTE — Progress Notes (Signed)
Patient ambulated in hall from [place] to social work office and back with walker and standby assistance.  Patient tolerated well.

## 2016-12-23 NOTE — Progress Notes (Signed)
[facility]  Patient Status: Active (receiving services up to time of hospitalization)  [facility] is providing the following services: RN and PT  If patient discharges after hours, please call [phone].   Abigail [name] 12/23/2016, 11:23 AM

## 2016-12-23 NOTE — Progress Notes (Signed)
Ref: Philis Fendt, MD   Subjective:  Episodes of chest pain last night. Abnormal troponin-I.  Objective:  Vital Signs in the last 24 hours: Temp:  [98.2 F (36.8 C)-99.7 F (37.6 C)] 98.9 F (37.2 C) (03/06 0519) Pulse Rate:  [57-149] 68 (03/06 0519) Cardiac Rhythm: Normal sinus rhythm (03/06 0700) Resp:  [16-18] 16 (03/06 0519) BP: (123-152)/(77-90) 152/90 (03/06 0519) SpO2:  [98 %-100 %] 100 % (03/06 0519) Weight:  [62.1 kg (136 lb 14.4 oz)-62.8 kg (138 lb 6.4 oz)] 62.8 kg (138 lb 6.4 oz) (03/06 0519)  Physical Exam: BP Readings from Last 1 Encounters:  12/23/16 (!) 152/90    Wt Readings from Last 1 Encounters:  12/23/16 62.8 kg (138 lb 6.4 oz)    Weight change:  Body mass index is 23.03 kg/m. HEENT: [place]/AT, Eyes-Brown, PERL, EOMI, Conjunctiva-Pink, Sclera-Non-icteric Neck: No JVD, No bruit, Trachea midline. Lungs:  Clear, Bilateral. Cardiac:  Regular rhythm, normal S1 and S2, no S3. II/VI systolic murmur. Abdomen:  Soft, non-tender. BS present. Extremities:  No edema present. No cyanosis. No clubbing. CNS: AxOx3, Cranial nerves grossly intact, moves all 4 extremities.  Skin: Warm and dry.   Intake/Output from previous day: 03/05 0701 - 03/06 0700 In: 5 [P.O.:240; I.V.:315] Out: 100 [Urine:100]    Lab Results: BMET    Component Value Date/Time   NA 139 12/23/2016 0446   NA 140 12/22/2016 2008   NA 140 12/20/2016 1602   NA 140 12/18/2016 0932   NA 140 12/11/2016 1437   NA 139 March 27, 202018 1145   K 3.5 12/23/2016 0446   K 3.5 12/22/2016 2008   K 4.2 12/20/2016 1602   K 4.9 12/18/2016 0932   K 4.2 12/11/2016 1437   K 3.9 March 27, 202018 1145   CL 105 12/23/2016 0446   CL 103 12/22/2016 2008   CL 109 12/20/2016 1602   CO2 25 12/23/2016 0446   CO2 24 12/22/2016 2008   CO2 23 12/20/2016 1602   CO2 24 12/18/2016 0932   CO2 22 12/11/2016 1437   CO2 21 (L) March 27, 202018 1145   GLUCOSE 105 (H) 12/23/2016 0446   GLUCOSE 100 (H) 12/22/2016 2008   GLUCOSE 116 (H)  12/20/2016 1602   GLUCOSE 138 12/18/2016 0932   GLUCOSE 119 12/11/2016 1437   GLUCOSE 123 March 27, 202018 1145   BUN 10 12/23/2016 0446   BUN 6 12/22/2016 2008   BUN 15 12/20/2016 1602   BUN 16.8 12/18/2016 0932   BUN 17.6 12/11/2016 1437   BUN 13.6 March 27, 202018 1145   CREATININE 0.86 12/23/2016 0446   CREATININE 0.95 12/22/2016 2008   CREATININE 1.23 (H) 12/20/2016 1602   CREATININE 1.7 (H) 12/18/2016 0932   CREATININE 1.6 (H) 12/11/2016 1437   CREATININE 1.2 (H) March 27, 202018 1145   CALCIUM 9.9 12/23/2016 0446   CALCIUM 10.0 12/22/2016 2008   CALCIUM 9.8 12/20/2016 1602   CALCIUM 9.9 12/18/2016 0932   CALCIUM 10.3 12/11/2016 1437   CALCIUM 9.9 March 27, 202018 1145   GFRNONAA >60 12/23/2016 0446   GFRNONAA 60 (L) 12/22/2016 2008   GFRNONAA 44 (L) 12/20/2016 1602   GFRAA >60 12/23/2016 0446   GFRAA >60 12/22/2016 2008   GFRAA 51 (L) 12/20/2016 1602   CBC    Component Value Date/Time   WBC 7.7 12/23/2016 0446   RBC 4.84 12/23/2016 0446   HGB 14.5 12/23/2016 0446   HGB 14.0 12/18/2016 0932   HCT 44.0 12/23/2016 0446   HCT 42.8 12/18/2016 0932   PLT 221 12/23/2016 0446  PLT 221 12/18/2016 0932   MCV 90.9 12/23/2016 0446   MCV 92.0 12/18/2016 0932   MCH 30.0 12/23/2016 0446   MCHC 33.0 12/23/2016 0446   RDW 16.6 (H) 12/23/2016 0446   RDW 17.6 (H) 12/18/2016 0932   LYMPHSABS 1.2 12/22/2016 2008   LYMPHSABS 1.2 12/18/2016 0932   MONOABS 1.0 12/22/2016 2008   MONOABS 0.7 12/18/2016 0932   EOSABS 0.0 12/22/2016 2008   EOSABS 0.1 12/18/2016 0932   BASOSABS 0.0 12/22/2016 2008   BASOSABS 0.1 12/18/2016 0932   HEPATIC Function Panel  Recent Labs  12/11/16 1437 12/18/16 0932 12/22/16 2008  PROT 6.6 6.4 6.2*   HEMOGLOBIN A1C No components found for: HGA1C,  MPG CARDIAC ENZYMES Lab Results  Component Value Date   CKTOTAL 129 01/01/2012   CKMB 4.6 (H) 01/01/2012   TROPONINI 0.06 (HH) 12/23/2016   TROPONINI 0.06 (HH) 12/22/2016   TROPONINI 0.08 (HH) 12/07/2016   BNP No  results for input(s): PROBNP in the last 8760 hours. TSH  Recent Labs  12/06/16 2005 12/18/16 1056 12/22/16 2008  TSH 0.541 0.576 0.481   CHOLESTEROL  Recent Labs  12/07/16 0605  CHOL 220*    Scheduled Meds: . aspirin EC  81 mg Oral Daily  . calcium carbonate  1 tablet Oral Q breakfast  . DULoxetine  20 mg Oral Daily  . feeding supplement (ENSURE ENLIVE)  237 mL Oral BID BM  . heparin  5,000 Units Subcutaneous Q8H  . metoprolol  5 mg Intravenous Once  . metoprolol tartrate  50 mg Oral BID  . pantoprazole  40 mg Oral BID AC  . pravastatin  40 mg Oral QPM   Continuous Infusions: . sodium chloride 75 mL/hr at 12/22/16 2216   PRN Meds:.ondansetron (ZOFRAN) IV, polyethylene glycol, traMADol  Assessment/Plan: Chest pain Dizziness Tachy-brady syndrome CAD Hypertension Multiple myeloma GERD Abnormal weight loss Abnormal troponin-I  Cardiac cath in AM.   LOS: 0 days    [name]  MD  12/23/2016, 9:06 AM

## 2016-12-23 NOTE — Progress Notes (Signed)
Initial Nutrition Assessment  DOCUMENTATION CODES:   Severe malnutrition in context of acute illness/injury  INTERVENTION:  Provide Ensure Enlive po QID, each supplement provides 350 kcal and 20 grams of protein Provide Mighty Shake once daily with breakfast, supplement provides 500 kcal and 23 grams of protein Provide snacks BID Provide and discussed "Suggestions for Increasing Calories and Protein" handout from the Academy of Nutrition and Dietetics  Recommend liberalizing diet to regular   NUTRITION DIAGNOSIS:   Malnutrition related to acute illness as evidenced by percent weight loss.   GOAL:   Patient will meet greater than or equal to 90% of their needs   MONITOR:   PO intake, Supplement acceptance, Skin, Weight trends, Labs, I & O's  REASON FOR ASSESSMENT:   Malnutrition Screening Tool, Consult Assessment of nutrition requirement/status  ASSESSMENT:   71 year old female with PMH of anemia, CAD, GERD, Hyperlipidemia, hypertension and Multiple myeloma has lost several pounds of weight since started chemotherapy with Velcade injections and Revlimid oral therapy. She presented to office with palpitations and dizziness off and on. EKG showed paroxysmal atrial tachycardia.   Pt states that a couple months ago she had sudden onset nausea and vomiting that lasted several months. Nausea resolved, but since then she reports having a very poor appetite and no taste. She reports eating a few bites of a sandwich, a few crackers or a few chips every 3-4 hours during the day. She has been drinking Ensure Original 3 times daily and tolerates this well. Weight history shows that pt has lost 11% of her body weight in the past month and 19% of her body weight within the past 3 months. Pt has moderate muscle wasting of thigh, patellar, scapula, temples, and calf and moderate fat wasting of arms and ribs per nutrition-focused physical exam.  Pt agreeable to drinking Ensure up to 4 times per  day and snacking more often.   Labs reviewed.   Diet Order:  Diet Heart Room service appropriate? Yes; Fluid consistency: Thin  Skin:  Reviewed, no issues  Last BM:  3/4  Height:   Ht Readings from Last 1 Encounters:  12/22/16 _0  (1.651 m)    Weight:   Wt Readings from Last 1 Encounters:  12/23/16 138 lb 6.4 oz (62.8 kg)    Ideal Body Weight:  56.8 kg  BMI:  Body mass index is 23.03 kg/m.  Estimated Nutritional Needs:   Kcal:  1900-2100  Protein:  75-88 grams  Fluid:  1.9 L/day  EDUCATION NEEDS:   Education needs addressed  Scarlette Ar RD, LDN, CSP Inpatient Clinical Dietitian Pager: [phone] After Hours Pager: [phone]

## 2016-12-23 NOTE — Progress Notes (Signed)
AM EKG performed early due to pt having an episode of CP. Pt's HR has been 50s-70s since PO Metoprolol was given last night. Will continue to monitor.

## 2016-12-24 ENCOUNTER — Encounter (HOSPITAL_COMMUNITY): Payer: Self-pay | Admitting: Cardiovascular Disease

## 2016-12-24 ENCOUNTER — Encounter (HOSPITAL_COMMUNITY)
Admission: AD | Disposition: A | Discharge status: Skilled Nursing Facility | Payer: Self-pay | Source: Ambulatory Visit | Attending: Cardiovascular Disease

## 2016-12-24 DIAGNOSIS — E860 Dehydration: Secondary | ICD-10-CM | POA: Diagnosis present

## 2016-12-24 DIAGNOSIS — T50905A Adverse effect of unspecified drugs, medicaments and biological substances, initial encounter: Secondary | ICD-10-CM | POA: Diagnosis not present

## 2016-12-24 DIAGNOSIS — I952 Hypotension due to drugs: Secondary | ICD-10-CM | POA: Diagnosis not present

## 2016-12-24 DIAGNOSIS — I472 Ventricular tachycardia: Secondary | ICD-10-CM | POA: Diagnosis present

## 2016-12-24 DIAGNOSIS — Z6822 Body mass index (BMI) 22.0-22.9, adult: Secondary | ICD-10-CM | POA: Diagnosis not present

## 2016-12-24 DIAGNOSIS — I495 Sick sinus syndrome: Secondary | ICD-10-CM | POA: Diagnosis present

## 2016-12-24 DIAGNOSIS — I214 Non-ST elevation (NSTEMI) myocardial infarction: Secondary | ICD-10-CM | POA: Diagnosis present

## 2016-12-24 DIAGNOSIS — I2510 Atherosclerotic heart disease of native coronary artery without angina pectoris: Secondary | ICD-10-CM | POA: Diagnosis present

## 2016-12-24 DIAGNOSIS — F329 Major depressive disorder, single episode, unspecified: Secondary | ICD-10-CM | POA: Diagnosis present

## 2016-12-24 DIAGNOSIS — E876 Hypokalemia: Secondary | ICD-10-CM | POA: Diagnosis not present

## 2016-12-24 DIAGNOSIS — I471 Supraventricular tachycardia: Secondary | ICD-10-CM | POA: Diagnosis not present

## 2016-12-24 DIAGNOSIS — I129 Hypertensive chronic kidney disease with stage 1 through stage 4 chronic kidney disease, or unspecified chronic kidney disease: Secondary | ICD-10-CM | POA: Diagnosis present

## 2016-12-24 DIAGNOSIS — E440 Moderate protein-calorie malnutrition: Secondary | ICD-10-CM | POA: Diagnosis present

## 2016-12-24 DIAGNOSIS — Z955 Presence of coronary angioplasty implant and graft: Secondary | ICD-10-CM | POA: Diagnosis not present

## 2016-12-24 DIAGNOSIS — N182 Chronic kidney disease, stage 2 (mild): Secondary | ICD-10-CM | POA: Diagnosis present

## 2016-12-24 DIAGNOSIS — F17210 Nicotine dependence, cigarettes, uncomplicated: Secondary | ICD-10-CM | POA: Diagnosis present

## 2016-12-24 DIAGNOSIS — R42 Dizziness and giddiness: Secondary | ICD-10-CM | POA: Diagnosis present

## 2016-12-24 DIAGNOSIS — E785 Hyperlipidemia, unspecified: Secondary | ICD-10-CM | POA: Diagnosis present

## 2016-12-24 DIAGNOSIS — D62 Acute posthemorrhagic anemia: Secondary | ICD-10-CM | POA: Diagnosis not present

## 2016-12-24 DIAGNOSIS — I249 Acute ischemic heart disease, unspecified: Secondary | ICD-10-CM | POA: Diagnosis present

## 2016-12-24 DIAGNOSIS — K219 Gastro-esophageal reflux disease without esophagitis: Secondary | ICD-10-CM | POA: Diagnosis present

## 2016-12-24 DIAGNOSIS — I252 Old myocardial infarction: Secondary | ICD-10-CM | POA: Diagnosis not present

## 2016-12-24 DIAGNOSIS — C9000 Multiple myeloma not having achieved remission: Secondary | ICD-10-CM | POA: Diagnosis present

## 2016-12-24 HISTORY — PX: LEFT HEART CATH AND CORONARY ANGIOGRAPHY: CATH118249

## 2016-12-24 LAB — POCT ACTIVATED CLOTTING TIME: Activated Clotting Time: 131 seconds

## 2016-12-24 LAB — PROTIME-INR
INR: 1.05
Prothrombin Time: 13.7 seconds (ref 11.4–15.2)

## 2016-12-24 SURGERY — LEFT HEART CATH AND CORONARY ANGIOGRAPHY

## 2016-12-24 MED ORDER — SODIUM CHLORIDE 0.9% FLUSH: 3.0000 mL | INTRAVENOUS | Status: DC | PRN

## 2016-12-24 MED ORDER — ASPIRIN EC 81 MG PO TBEC
81.0000 mg | DELAYED_RELEASE_TABLET | Freq: Every day | ORAL | Status: DC
  Administered 2016-12-25 – 2017-01-02 (×9): 81 mg via ORAL
  Filled 2016-12-24 (×9): qty 1

## 2016-12-24 MED ORDER — SODIUM CHLORIDE 0.9% FLUSH: 3.0000 mL | Freq: Two times a day (BID) | INTRAVENOUS | Status: DC

## 2016-12-24 MED ORDER — ASPIRIN 81 MG PO CHEW: 81.0000 mg | CHEWABLE_TABLET | Freq: Once | ORAL | Status: DC

## 2016-12-24 MED ORDER — SODIUM CHLORIDE 0.9 % IV SOLN: 250.0000 mL | INTRAVENOUS | Status: DC | PRN

## 2016-12-24 MED ORDER — HEPARIN (PORCINE) IN NACL 2-0.9 UNIT/ML-% IJ SOLN
INTRAMUSCULAR | Status: AC
  Filled 2016-12-24: qty 1000

## 2016-12-24 MED ORDER — MIDAZOLAM HCL 2 MG/2ML IJ SOLN
INTRAMUSCULAR | Status: DC | PRN
  Administered 2016-12-24: 1 mg via INTRAVENOUS

## 2016-12-24 MED ORDER — TICAGRELOR 90 MG PO TABS
90.0000 mg | ORAL_TABLET | Freq: Two times a day (BID) | ORAL | Status: DC
  Administered 2016-12-24 – 2017-01-02 (×19): 90 mg via ORAL
  Filled 2016-12-24 (×19): qty 1

## 2016-12-24 MED ORDER — HEPARIN (PORCINE) IN NACL 2-0.9 UNIT/ML-% IJ SOLN
INTRAMUSCULAR | Status: DC | PRN
  Administered 2016-12-24: 1000 mL

## 2016-12-24 MED ORDER — SODIUM CHLORIDE 0.9 % WEIGHT BASED INFUSION
3.0000 mL/kg/h | INTRAVENOUS | Status: AC
  Administered 2016-12-24: 3 mL/kg/h via INTRAVENOUS

## 2016-12-24 MED ORDER — LIDOCAINE HCL (PF) 1 % IJ SOLN
INTRAMUSCULAR | Status: DC | PRN
  Administered 2016-12-24: 15 mL

## 2016-12-24 MED ORDER — FENTANYL CITRATE (PF) 100 MCG/2ML IJ SOLN
INTRAMUSCULAR | Status: DC | PRN
  Administered 2016-12-24: 25 ug via INTRAVENOUS

## 2016-12-24 MED ORDER — IOPAMIDOL (ISOVUE-370) INJECTION 76%
INTRAVENOUS | Status: DC | PRN
  Administered 2016-12-24: 55 mL via INTRA_ARTERIAL

## 2016-12-24 MED ORDER — LIDOCAINE HCL (PF) 1 % IJ SOLN
INTRAMUSCULAR | Status: AC
  Filled 2016-12-24: qty 30

## 2016-12-24 MED ORDER — SODIUM CHLORIDE 0.9 % WEIGHT BASED INFUSION
1.0000 mL/kg/h | INTRAVENOUS | Status: DC
  Administered 2016-12-24: 1 mL/kg/h via INTRAVENOUS

## 2016-12-24 MED ORDER — HYDRALAZINE HCL 20 MG/ML IJ SOLN
INTRAMUSCULAR | Status: DC | PRN
  Administered 2016-12-24: 10 mg via INTRAVENOUS

## 2016-12-24 MED ORDER — ACETAMINOPHEN 325 MG PO TABS
650.0000 mg | ORAL_TABLET | ORAL | Status: DC | PRN
  Administered 2016-12-24 – 2016-12-28 (×4): 650 mg via ORAL
  Filled 2016-12-24 (×4): qty 2

## 2016-12-24 MED ORDER — ASPIRIN 81 MG PO CHEW
81.0000 mg | CHEWABLE_TABLET | ORAL | Status: AC
  Administered 2016-12-24: 81 mg via ORAL
  Filled 2016-12-24: qty 1

## 2016-12-24 MED ORDER — FENTANYL CITRATE (PF) 100 MCG/2ML IJ SOLN
INTRAMUSCULAR | Status: AC
  Filled 2016-12-24: qty 2

## 2016-12-24 MED ORDER — SODIUM CHLORIDE 0.9% FLUSH
3.0000 mL | Freq: Two times a day (BID) | INTRAVENOUS | Status: DC
  Administered 2016-12-25 – 2016-12-27 (×5): 3 mL via INTRAVENOUS

## 2016-12-24 MED ORDER — HYDRALAZINE HCL 20 MG/ML IJ SOLN
INTRAMUSCULAR | Status: AC
  Filled 2016-12-24: qty 1

## 2016-12-24 MED ORDER — MIDAZOLAM HCL 2 MG/2ML IJ SOLN
INTRAMUSCULAR | Status: AC
  Filled 2016-12-24: qty 2

## 2016-12-24 MED ORDER — SODIUM CHLORIDE 0.9 % IV SOLN: INTRAVENOUS | Status: DC

## 2016-12-24 MED ORDER — SODIUM CHLORIDE 0.9 % IV SOLN: INTRAVENOUS | Status: AC

## 2016-12-24 SURGICAL SUPPLY — 7 items
CATH INFINITI 5FR MULTPACK ANG (CATHETERS) ×2 IMPLANT
KIT HEART LEFT (KITS) ×3 IMPLANT
PACK CARDIAC CATHETERIZATION (CUSTOM PROCEDURE TRAY) ×3 IMPLANT
SHEATH PINNACLE 5F 10CM (SHEATH) ×2 IMPLANT
SYR MEDRAD MARK V 150ML (SYRINGE) ×3 IMPLANT
TRANSDUCER W/STOPCOCK (MISCELLANEOUS) ×3 IMPLANT
WIRE EMERALD 3MM-J .035X150CM (WIRE) ×2 IMPLANT

## 2016-12-24 NOTE — Progress Notes (Signed)
Patient up to [facility] with standby assist, right groin level 0.

## 2016-12-24 NOTE — Progress Notes (Signed)
Pt for cardiac cath this AM. Pt has been NPO since midnight. No order for consent at this time. Pt refused cath video last night. EKG, CBC, and BMET within date. INR added to AM labs. IV team aware of need for 2nd IV site, avoiding right wrist. Will continue to monitor.

## 2016-12-24 NOTE — Progress Notes (Signed)
Site area: Right groin a 5 french arterial sheath was removed  Site Prior to Removal:  Level 0  Pressure Applied For 15 MINUTES    Bedrest Beginning at 0935  Manual:   Yes.    Patient Status During Pull:  stable  Post Pull Groin Site:  Level 0  Post Pull Instructions Given:  Yes.    Post Pull Pulses Present:  Yes.    Dressing Applied:  Yes.    Comments:  VS remain stable during sheath pull.

## 2016-12-24 NOTE — Progress Notes (Signed)
Pt bedrest from cardiac cath is complete. R groin has remained at a level 0.

## 2016-12-25 LAB — CBC
HCT: 40.6 % (ref 36.0–46.0)
Hemoglobin: 13.1 g/dL (ref 12.0–15.0)
MCH: 28.9 pg (ref 26.0–34.0)
MCHC: 32.3 g/dL (ref 30.0–36.0)
MCV: 89.4 fL (ref 78.0–100.0)
PLATELETS: 210 10*3/uL (ref 150–400)
RBC: 4.54 MIL/uL (ref 3.87–5.11)
RDW: 16.2 % — AB (ref 11.5–15.5)
WBC: 5.2 10*3/uL (ref 4.0–10.5)

## 2016-12-25 LAB — BASIC METABOLIC PANEL
Anion gap: 13 (ref 5–15)
BUN: 6 mg/dL (ref 6–20)
CALCIUM: 9 mg/dL (ref 8.9–10.3)
CO2: 19 mmol/L — ABNORMAL LOW (ref 22–32)
Chloride: 105 mmol/L (ref 101–111)
Creatinine, Ser: 0.7 mg/dL (ref 0.44–1.00)
GFR calc Af Amer: >60 mL/min (ref >60)
Glucose, Bld: 89 mg/dL (ref 65–99)
Potassium: 3.4 mmol/L — ABNORMAL LOW (ref 3.5–5.1)
Sodium: 137 mmol/L (ref 135–145)

## 2016-12-25 MED ORDER — POTASSIUM CHLORIDE CRYS ER 10 MEQ PO TBCR
10.0000 meq | EXTENDED_RELEASE_TABLET | Freq: Two times a day (BID) | ORAL | Status: DC
  Administered 2016-12-25 – 2017-01-02 (×17): 10 meq via ORAL
  Filled 2016-12-25 (×18): qty 1

## 2016-12-25 MED ORDER — MAGNESIUM SULFATE IN D5W 1-5 GM/100ML-% IV SOLN
1.0000 g | Freq: Once | INTRAVENOUS | Status: AC
  Administered 2016-12-26: 1 g via INTRAVENOUS
  Filled 2016-12-25 (×2): qty 100

## 2016-12-25 NOTE — Progress Notes (Signed)
Ref: Philis Fendt, MD   Subjective:  Had 5 beat run of VT. Tolerating PO feedings but poor appetite.   Objective:  Vital Signs in the last 24 hours: Temp:  [98.3 F (36.8 C)-99.7 F (37.6 C)] 98.3 F (36.8 C) (03/08 1243) Pulse Rate:  [59-141] 59 (03/08 1243) Cardiac Rhythm: Ventricular tachycardia;Other (Comment) (03/08 1834) Resp:  [18] 18 (03/08 1243) BP: (115-152)/(66-80) 152/80 (03/08 1243) SpO2:  [98 %-100 %] 100 % (03/08 1243) Weight:  [62.3 kg (137 lb 4.8 oz)] 62.3 kg (137 lb 4.8 oz) (03/08 0532)  Physical Exam: BP Readings from Last 1 Encounters:  12/25/16 (!) 152/80    Wt Readings from Last 1 Encounters:  12/25/16 62.3 kg (137 lb 4.8 oz)    Weight change: -1.179 kg (-2 lb 9.6 oz) Body mass index is 22.85 kg/m. HEENT: [place]/AT, Eyes-Brown, PERL, EOMI, Conjunctiva-Pink, Sclera-Non-icteric Neck: No JVD, No bruit, Trachea midline. Lungs:  Clear, Bilateral. Cardiac:  Regular rhythm, normal S1 and S2, no S3. II/VI systolic murmur. Abdomen:  Soft, non-tender. BS present. Extremities:  No edema present. No cyanosis. No clubbing. CNS: AxOx3, Cranial nerves grossly intact, moves all 4 extremities.  Skin: Warm and dry.   Intake/Output from previous day: 03/07 0701 - 03/08 0700 In: 1323.8 [P.O.:480; I.V.:843.8] Out: 1950 [Urine:1950]    Lab Results: BMET    Component Value Date/Time   NA 137 12/25/2016 0427   NA 139 12/23/2016 0446   NA 140 12/22/2016 2008   NA 140 12/18/2016 0932   NA 140 12/11/2016 1437   NA 139 12/02/2016 1145   K 3.4 (L) 12/25/2016 0427   K 3.5 12/23/2016 0446   K 3.5 12/22/2016 2008   K 4.9 12/18/2016 0932   K 4.2 12/11/2016 1437   K 3.9 12/02/2016 1145   CL 105 12/25/2016 0427   CL 105 12/23/2016 0446   CL 103 12/22/2016 2008   CO2 19 (L) 12/25/2016 0427   CO2 25 12/23/2016 0446   CO2 24 12/22/2016 2008   CO2 24 12/18/2016 0932   CO2 22 12/11/2016 1437   CO2 21 (L) 12/02/2016 1145   GLUCOSE 89 12/25/2016 0427   GLUCOSE 105 (H)  12/23/2016 0446   GLUCOSE 100 (H) 12/22/2016 2008   GLUCOSE 138 12/18/2016 0932   GLUCOSE 119 12/11/2016 1437   GLUCOSE 123 12/02/2016 1145   BUN 6 12/25/2016 0427   BUN 10 12/23/2016 0446   BUN 6 12/22/2016 2008   BUN 16.8 12/18/2016 0932   BUN 17.6 12/11/2016 1437   BUN 13.6 12/02/2016 1145   CREATININE 0.70 12/25/2016 0427   CREATININE 0.86 12/23/2016 0446   CREATININE 0.95 12/22/2016 2008   CREATININE 1.7 (H) 12/18/2016 0932   CREATININE 1.6 (H) 12/11/2016 1437   CREATININE 1.2 (H) 12/02/2016 1145   CALCIUM 9.0 12/25/2016 0427   CALCIUM 9.9 12/23/2016 0446   CALCIUM 10.0 12/22/2016 2008   CALCIUM 9.9 12/18/2016 0932   CALCIUM 10.3 12/11/2016 1437   CALCIUM 9.9 12/02/2016 1145   GFRNONAA >60 12/25/2016 0427   GFRNONAA >60 12/23/2016 0446   GFRNONAA 60 (L) 12/22/2016 2008   GFRAA >60 12/25/2016 0427   GFRAA >60 12/23/2016 0446   GFRAA >60 12/22/2016 2008   CBC    Component Value Date/Time   WBC 5.2 12/25/2016 0427   RBC 4.54 12/25/2016 0427   HGB 13.1 12/25/2016 0427   HGB 14.0 12/18/2016 0932   HCT 40.6 12/25/2016 0427   HCT 42.8 12/18/2016 0932   PLT 210  12/25/2016 0427   PLT 221 12/18/2016 0932   MCV 89.4 12/25/2016 0427   MCV 92.0 12/18/2016 0932   MCH 28.9 12/25/2016 0427   MCHC 32.3 12/25/2016 0427   RDW 16.2 (H) 12/25/2016 0427   RDW 17.6 (H) 12/18/2016 0932   LYMPHSABS 1.2 12/22/2016 2008   LYMPHSABS 1.2 12/18/2016 0932   MONOABS 1.0 12/22/2016 2008   MONOABS 0.7 12/18/2016 0932   EOSABS 0.0 12/22/2016 2008   EOSABS 0.1 12/18/2016 0932   BASOSABS 0.0 12/22/2016 2008   BASOSABS 0.1 12/18/2016 0932   HEPATIC Function Panel  Recent Labs  12/11/16 1437 12/18/16 0932 12/22/16 2008  PROT 6.6 6.4 6.2*   HEMOGLOBIN A1C No components found for: HGA1C,  MPG CARDIAC ENZYMES Lab Results  Component Value Date   CKTOTAL 129 01/01/2012   CKMB 4.6 (H) 01/01/2012   TROPONINI 0.04 (HH) 12/23/2016   TROPONINI 0.06 (HH) 12/23/2016   TROPONINI 0.06 (HH)  12/22/2016   BNP No results for input(s): PROBNP in the last 8760 hours. TSH  Recent Labs  12/06/16 2005 12/18/16 1056 12/22/16 2008  TSH 0.541 0.576 0.481   CHOLESTEROL  Recent Labs  12/07/16 0605  CHOL 220*    Scheduled Meds: . amLODipine  5 mg Oral Daily  . aspirin EC  81 mg Oral Daily  . calcium carbonate  1 tablet Oral Q breakfast  . DULoxetine  20 mg Oral Daily  . feeding supplement (ENSURE ENLIVE)  237 mL Oral QID  . heparin  5,000 Units Subcutaneous Q8H  . magnesium sulfate 1 - 4 g bolus IVPB  1 g Intravenous Once  . metoprolol  5 mg Intravenous Once  . metoprolol tartrate  50 mg Oral BID  . pantoprazole  40 mg Oral BID AC  . potassium chloride  10 mEq Oral BID  . pravastatin  40 mg Oral QPM  . sodium chloride flush  3 mL Intravenous Q12H  . ticagrelor  90 mg Oral BID   Continuous Infusions: PRN Meds:.sodium chloride, acetaminophen, ondansetron (ZOFRAN) IV, polyethylene glycol, sodium chloride flush, traMADol  Assessment/Plan: Multivessel CAD Hypertension Multiple myeloma GERD Abnormal weight loss  IV magnesium. Increase activity. If tolerates PO medications and feedings then schedule angioplasty in 1-2 weeks.    LOS: 1 day    [name]  MD  12/25/2016, 6:45 PM

## 2016-12-26 MED ORDER — MIRTAZAPINE 15 MG PO TABS
15.0000 mg | ORAL_TABLET | Freq: Every day | ORAL | Status: DC
  Administered 2016-12-26 – 2017-01-01 (×7): 15 mg via ORAL
  Filled 2016-12-26 (×8): qty 1

## 2016-12-26 NOTE — Progress Notes (Signed)
Ref: Edwin A Avbuere, MD   Subjective:  Patient remains depressed. Not eating much. Did not walk today.   Objective:  Vital Signs in the last 24 hours: Temp:  [98.2 F (36.8 C)-98.4 F (36.9 C)] 98.2 F (36.8 C) (03/09 0703) Pulse Rate:  [64-70] 70 (03/09 0703) Cardiac Rhythm: Supraventricular tachycardia (03/09 1006) Resp:  [18] 18 (03/09 0703) BP: (138-140)/(80-85) 138/80 (03/09 0703) SpO2:  [100 %] 100 % (03/09 0703) Weight:  [61.9 kg (136 lb 8 oz)] 61.9 kg (136 lb 8 oz) (03/09 0703)  Physical Exam: BP Readings from Last 1 Encounters:  12/26/16 138/80    Wt Readings from Last 1 Encounters:  12/26/16 61.9 kg (136 lb 8 oz)    Weight change:  Body mass index is 22.71 kg/m. HEENT: [place]/AT, Eyes-Brown, PERL, EOMI, Conjunctiva-Pink, Sclera-Non-icteric Neck: No JVD, No bruit, Trachea midline. Lungs:  Clear, Bilateral. Cardiac:  Regular rhythm, normal S1 and S2, no S3. II/VI systolic murmur. Abdomen:  Soft, non-tender. BS present. Extremities:  No edema present. No cyanosis. No clubbing. CNS: AxOx3, Cranial nerves grossly intact, moves all 4 extremities.  Skin: Warm and dry.   Intake/Output from previous day: 03/08 0701 - 03/09 0700 In: 700 [P.O.:600] Out: 800 [Urine:800]    Lab Results: BMET    Component Value Date/Time   NA 137 12/25/2016 0427   NA 139 12/23/2016 0446   NA 140 12/22/2016 2008   NA 140 12/18/2016 0932   NA 140 12/11/2016 1437   NA 139 12/03/2016 1145   K 3.4 (L) 12/25/2016 0427   K 3.5 12/23/2016 0446   K 3.5 12/22/2016 2008   K 4.9 12/18/2016 0932   K 4.2 12/11/2016 1437   K 3.9 12/03/2016 1145   CL 105 12/25/2016 0427   CL 105 12/23/2016 0446   CL 103 12/22/2016 2008   CO2 19 (L) 12/25/2016 0427   CO2 25 12/23/2016 0446   CO2 24 12/22/2016 2008   CO2 24 12/18/2016 0932   CO2 22 12/11/2016 1437   CO2 21 (L) 12/03/2016 1145   GLUCOSE 89 12/25/2016 0427   GLUCOSE 105 (H) 12/23/2016 0446   GLUCOSE 100 (H) 12/22/2016 2008   GLUCOSE 138  12/18/2016 0932   GLUCOSE 119 12/11/2016 1437   GLUCOSE 123 12/03/2016 1145   BUN 6 12/25/2016 0427   BUN 10 12/23/2016 0446   BUN 6 12/22/2016 2008   BUN 16.8 12/18/2016 0932   BUN 17.6 12/11/2016 1437   BUN 13.6 12/03/2016 1145   CREATININE 0.70 12/25/2016 0427   CREATININE 0.86 12/23/2016 0446   CREATININE 0.95 12/22/2016 2008   CREATININE 1.7 (H) 12/18/2016 0932   CREATININE 1.6 (H) 12/11/2016 1437   CREATININE 1.2 (H) 12/03/2016 1145   CALCIUM 9.0 12/25/2016 0427   CALCIUM 9.9 12/23/2016 0446   CALCIUM 10.0 12/22/2016 2008   CALCIUM 9.9 12/18/2016 0932   CALCIUM 10.3 12/11/2016 1437   CALCIUM 9.9 12/03/2016 1145   GFRNONAA >60 12/25/2016 0427   GFRNONAA >60 12/23/2016 0446   GFRNONAA 60 (L) 12/22/2016 2008   GFRAA >60 12/25/2016 0427   GFRAA >60 12/23/2016 0446   GFRAA >60 12/22/2016 2008   CBC    Component Value Date/Time   WBC 5.2 12/25/2016 0427   RBC 4.54 12/25/2016 0427   HGB 13.1 12/25/2016 0427   HGB 14.0 12/18/2016 0932   HCT 40.6 12/25/2016 0427   HCT 42.8 12/18/2016 0932   PLT 210 12/25/2016 0427   PLT 221 12/18/2016 0932   MCV   89.4 12/25/2016 0427   MCV 92.0 12/18/2016 0932   MCH 28.9 12/25/2016 0427   MCHC 32.3 12/25/2016 0427   RDW 16.2 (H) 12/25/2016 0427   RDW 17.6 (H) 12/18/2016 0932   LYMPHSABS 1.2 12/22/2016 2008   LYMPHSABS 1.2 12/18/2016 0932   MONOABS 1.0 12/22/2016 2008   MONOABS 0.7 12/18/2016 0932   EOSABS 0.0 12/22/2016 2008   EOSABS 0.1 12/18/2016 0932   BASOSABS 0.0 12/22/2016 2008   BASOSABS 0.1 12/18/2016 0932   HEPATIC Function Panel  Recent Labs  12/11/16 1437 12/18/16 0932 12/22/16 2008  PROT 6.6 6.4 6.2*   HEMOGLOBIN A1C No components found for: HGA1C,  MPG CARDIAC ENZYMES Lab Results  Component Value Date   CKTOTAL 129 01/01/2012   CKMB 4.6 (H) 01/01/2012   TROPONINI 0.04 (HH) 12/23/2016   TROPONINI 0.06 (HH) 12/23/2016   TROPONINI 0.06 (HH) 12/22/2016   BNP No results for input(s): PROBNP in the last  8760 hours. TSH  Recent Labs  12/06/16 2005 12/18/16 1056 12/22/16 2008  TSH 0.541 0.576 0.481   CHOLESTEROL  Recent Labs  12/07/16 0605  CHOL 220*    Scheduled Meds: . amLODipine  5 mg Oral Daily  . aspirin EC  81 mg Oral Daily  . calcium carbonate  1 tablet Oral Q breakfast  . DULoxetine  20 mg Oral Daily  . feeding supplement (ENSURE ENLIVE)  237 mL Oral QID  . heparin  5,000 Units Subcutaneous Q8H  . metoprolol  5 mg Intravenous Once  . metoprolol tartrate  50 mg Oral BID  . mirtazapine  15 mg Oral QHS  . pantoprazole  40 mg Oral BID AC  . potassium chloride  10 mEq Oral BID  . pravastatin  40 mg Oral QPM  . sodium chloride flush  3 mL Intravenous Q12H  . ticagrelor  90 mg Oral BID   Continuous Infusions: PRN Meds:.sodium chloride, acetaminophen, ondansetron (ZOFRAN) IV, polyethylene glycol, sodium chloride flush, traMADol  Assessment/Plan: Multivessel CAD Hypertension Multiple myeloma GERD Abnormal weight loss Depression  Add Remeron. Encourage feeding.   LOS: 2 days    Tanaja Ganger  MD  12/26/2016, 6:17 PM     

## 2016-12-26 NOTE — Progress Notes (Signed)
Patient ambulated to nurses station with walker and standby assistance.  HR low 100s.  Patient was very tired, had to take several breaks, and experienced some shortness of breath. Patient recovered after resting in bed.

## 2016-12-27 MED ORDER — METOPROLOL TARTRATE 50 MG PO TABS
50.0000 mg | ORAL_TABLET | Freq: Two times a day (BID) | ORAL | 3 refills | Status: DC
Start: 2016-12-27 — End: 2017-01-02

## 2016-12-27 MED ORDER — MIRTAZAPINE 15 MG PO TABS: 15.0000 mg | ORAL_TABLET | Freq: Every day | ORAL | 3 refills | Status: DC

## 2016-12-27 MED ORDER — TICAGRELOR 90 MG PO TABS: 90.0000 mg | ORAL_TABLET | Freq: Two times a day (BID) | ORAL | 3 refills | Status: DC

## 2016-12-27 MED ORDER — POTASSIUM CHLORIDE CRYS ER 10 MEQ PO TBCR: 10.0000 meq | EXTENDED_RELEASE_TABLET | Freq: Every day | ORAL | 1 refills | Status: DC

## 2016-12-27 NOTE — Discharge Summary (Signed)
Physician Discharge Summary  Patient ID: Abigail [name] MRN: [id number] DOB/AGE: 05/20/1946 71 y.o.  Admit date: 12/22/2016 Discharge date: 12/27/2016  Admission Diagnoses: Dizziness Tachy-brady syndrome CAD Hypertension Multiple myeloma GERD Abnormal weight loss CKD, II  Discharge Diagnoses:  Principal Problem:   Dizziness Active Problems:   Tachycardia-bradycardia ([place])   Malnutrition of moderate degree   Multiple myeloma (HCC)   Essential hypertension   Acute coronary syndrome (HCC)   Dehydration   GERD   Abnormal weight loss   Depression   Hypokalemia  Discharged Condition: fair  Hospital Course: 71 year old female with hypertension, hyperlipidemia, CAD, GERD and multiple myeloma had several pounds weight loss since she started chemotherapy for her myeloma and chest pain. She has nausea and lack of appetite. She underwent cardiac catheterization showing multivessel CAD. She was started on Aspirin and Brilinta. She needed Remeron, Pantoprazole and Ensure to improve nutrition. She will have stent placement in 1-2 weeks if she tolerates aspirin and Brilinta. She will be followed by me, Primary care and Oncologist Dr. Benay Spice in 1 week or as arranged.  Consults: cardiology  Significant Diagnostic Studies: labs: Near normal CBC except slightly increased Red Cell Distribution. Width. Her BMET was near normal with slightly high creatinine of 1.23 improving with hydration.  Chest X-ray: Unremarkable.  Cardiac catheterization: Multi-vessel CAD.  Treatments: cardiac meds: Aspirin, Brilinta, metoprolol, amlodipine and Pravastatin  Discharge Exam: Blood pressure 140/78, pulse 60, temperature 99.1 F (37.3 C), temperature source Oral, resp. rate 20, height '5\' 5"'  (1.651 m), weight 61.5 kg (135 lb 9.6 oz), SpO2 100 %. General appearance: alert, cooperative and appears stated age Head: Normocephalic, atraumatic. Eyes: Brown eyes, pink conjunctivae/corneas clear. PERRL, EOM's intact.   Neck: no adenopathy, no carotid bruit, no JVD, supple, symmetrical, trachea midline and thyroid not enlarged. Resp: clear to auscultation bilaterally.  Cardio: regular rate and rhythm, S1, S2 normal, II/VI systolic murmur, no click, rub or gallop GI: soft, non-tender; bowel sounds normal; no masses,  no organomegaly Extremities: No cyanosis or edema Skin: Warm and dry. No rashes or lesions Neurologic: Alert and oriented X 3, normal strength and tone. Normal coordination and slow gait.  Disposition: 01-Home or Self Care   Allergies as of 12/27/2016      Reactions   Sulfa Antibiotics Rash      Medication List    STOP taking these medications   dexamethasone 4 MG tablet Commonly known as:  DECADRON   FLUoxetine 10 MG capsule Commonly known as:  PROZAC   traMADol 50 MG tablet Commonly known as:  ULTRAM     TAKE these medications   acyclovir 400 MG tablet Commonly known as:  ZOVIRAX TAKE 1 TABLET (400 MG TOTAL) BY MOUTH 2 (TWO) TIMES DAILY.   amLODipine 10 MG tablet Commonly known as:  NORVASC Take 10 mg by mouth every evening.   aspirin EC 81 MG tablet Take 81 mg by mouth daily.   calcium carbonate 1250 (500 Ca) MG tablet Commonly known as:  OS-CAL - dosed in mg of elemental calcium Take 1 tablet by mouth daily with breakfast.   DULoxetine 20 MG capsule Commonly known as:  CYMBALTA Take 1 capsule (20 mg total) by mouth daily.   feeding supplement (ENSURE ENLIVE) Liqd Take 237 mLs by mouth 2 (two) times daily between meals. What changed:  when to take this  additional instructions   metoprolol 50 MG tablet Commonly known as:  LOPRESSOR Take 1 tablet (50 mg total) by mouth 2 (two)  times daily. What changed:  medication strength  how much to take   midodrine 2.5 MG tablet Commonly known as:  PROAMATINE Take 1 tablet (2.5 mg total) by mouth 2 (two) times daily with a meal.   mirtazapine 15 MG tablet Commonly known as:  REMERON Take 1 tablet (15 mg  total) by mouth at bedtime.   pantoprazole 40 MG tablet Commonly known as:  PROTONIX Take 1 tablet (40 mg total) by mouth 2 (two) times daily before a meal.   polyethylene glycol packet Commonly known as:  MIRALAX / GLYCOLAX Take 17 g by mouth daily as needed for moderate constipation.   potassium chloride 10 MEQ tablet Commonly known as:  K-DUR,KLOR-CON Take 1 tablet (10 mEq total) by mouth daily.   pravastatin 40 MG tablet Commonly known as:  PRAVACHOL Take 40 mg by mouth every evening.   ticagrelor 90 MG Tabs tablet Commonly known as:  BRILINTA Take 1 tablet (90 mg total) by mouth 2 (two) times daily.   vitamin C 1000 MG tablet Take 1,000 mg by mouth daily.   VITAMIN D PO Take 1 capsule by mouth daily.      Follow-up Information    Philis Fendt, MD. Schedule an appointment as soon as possible for a visit in 1 week(s).   Specialty:  Internal Medicine Contact information: [place] [address] [phone]        Birdie Riddle, MD. Schedule an appointment as soon as possible for a visit in 1 week(s).   Specialty:  Cardiology Contact information: [place] [address] [phone]        [name], MD. Schedule an appointment as soon as possible for a visit in 1 week(s).   Specialty:  Oncology Contact information: [place] [address] [phone]           Signed: Birdie Riddle 12/27/2016, 4:16 PM

## 2016-12-27 NOTE — Progress Notes (Addendum)
Pt feeling dizzy when ambulating around room. Pt states she doesn't think it would be a good decision to go home tonight. MD made aware. Orders received to cancel discharge. Placed back on tele box #20. VSS placed in chart sister at the bedside.

## 2016-12-27 NOTE — Progress Notes (Signed)
CM received request from RN for a free 30 day trial card for Brilinta.  CM met with pt in room and explained to pt this card will pay for today's prescription and give insurance time to authorize for refills. Aniceto Boss, RN states she will reinforce understanding with sister of pt when she comes to pick up pt.  No other CM needs were communicated.

## 2016-12-27 NOTE — Progress Notes (Signed)
Given discharge instructions questions answered. IV and tele removed. Awaiting sister for transportation home.

## 2016-12-28 LAB — CBC
HEMATOCRIT: 46.1 % — AB (ref 36.0–46.0)
HEMOGLOBIN: 15.3 g/dL — AB (ref 12.0–15.0)
MCH: 29.8 pg (ref 26.0–34.0)
MCHC: 33.2 g/dL (ref 30.0–36.0)
MCV: 89.9 fL (ref 78.0–100.0)
Platelets: 236 10*3/uL (ref 150–400)
RBC: 5.13 MIL/uL — ABNORMAL HIGH (ref 3.87–5.11)
RDW: 16.4 % — AB (ref 11.5–15.5)
WBC: 4.9 10*3/uL (ref 4.0–10.5)

## 2016-12-28 LAB — HEPARIN LEVEL (UNFRACTIONATED): Heparin Unfractionated: >2.2 IU/mL — ABNORMAL HIGH (ref 0.30–0.70)

## 2016-12-28 MED ORDER — HEPARIN BOLUS VIA INFUSION
3000.0000 [IU] | Freq: Once | INTRAVENOUS | Status: AC
  Administered 2016-12-28: 3000 [IU] via INTRAVENOUS
  Filled 2016-12-28: qty 3000

## 2016-12-28 MED ORDER — HEPARIN (PORCINE) IN NACL 100-0.45 UNIT/ML-% IJ SOLN
750.0000 [IU]/h | INTRAMUSCULAR | Status: DC
  Administered 2016-12-28: 750 [IU]/h via INTRAVENOUS
  Filled 2016-12-28: qty 250

## 2016-12-28 MED ORDER — HEPARIN (PORCINE) IN NACL 100-0.45 UNIT/ML-% IJ SOLN
500.0000 [IU]/h | INTRAMUSCULAR | Status: DC
  Administered 2016-12-29: 500 [IU]/h via INTRAVENOUS

## 2016-12-28 NOTE — Progress Notes (Signed)
Ref: Dorrene German, MD   Subjective:  Discharge cancelled due to recurrent dizziness and chest discomfort.  Objective:  Vital Signs in the last 24 hours: Temp:  [97.8 F (36.6 C)-98.9 F (37.2 C)] 97.8 F (36.6 C) (03/11 1233) Pulse Rate:  [50-73] 50 (03/11 1233) Cardiac Rhythm: Normal sinus rhythm (03/11 0745) Resp:  [20] 20 (03/11 1233) BP: (109-171)/(71-98) 171/82 (03/11 1233) SpO2:  [93 %-100 %] 100 % (03/11 1233) Weight:  [61 kg (134 lb 8 oz)] 61 kg (134 lb 8 oz) (03/11 9115)  Physical Exam: BP Readings from Last 1 Encounters:  12/28/16 (!) 171/82    Wt Readings from Last 1 Encounters:  12/28/16 61 kg (134 lb 8 oz)    Weight change: -0.907 kg (-2 lb) Body mass index is 22.38 kg/m. HEENT: [place]/AT, Eyes-Brown, PERL, EOMI, Conjunctiva-Pink, Sclera-Non-icteric Neck: No JVD, No bruit, Trachea midline. Lungs:  Clear, Bilateral. Cardiac:  Regular rhythm, normal S1 and S2, no S3. II/VI systolic murmur. Abdomen:  Soft, non-tender. BS present. Extremities:  No edema present. No cyanosis. No clubbing. CNS: AxOx3, Cranial nerves grossly intact, moves all 4 extremities.  Skin: Warm and dry.   Intake/Output from previous day: 03/10 0701 - 03/11 0700 In: 300 [P.O.:300] Out: 200 [Urine:200]    Lab Results: BMET    Component Value Date/Time   NA 137 12/25/2016 0427   NA 139 12/23/2016 0446   NA 140 12/22/2016 2008   NA 140 12/18/2016 0932   NA 140 12/11/2016 1437   NA 139 2020/07/3117 1145   K 3.4 (L) 12/25/2016 0427   K 3.5 12/23/2016 0446   K 3.5 12/22/2016 2008   K 4.9 12/18/2016 0932   K 4.2 12/11/2016 1437   K 3.9 2020/07/3117 1145   CL 105 12/25/2016 0427   CL 105 12/23/2016 0446   CL 103 12/22/2016 2008   CO2 19 (L) 12/25/2016 0427   CO2 25 12/23/2016 0446   CO2 24 12/22/2016 2008   CO2 24 12/18/2016 0932   CO2 22 12/11/2016 1437   CO2 21 (L) 2020/07/3117 1145   GLUCOSE 89 12/25/2016 0427   GLUCOSE 105 (H) 12/23/2016 0446   GLUCOSE 100 (H) 12/22/2016 2008   GLUCOSE 138 12/18/2016 0932   GLUCOSE 119 12/11/2016 1437   GLUCOSE 123 2020/07/3117 1145   BUN 6 12/25/2016 0427   BUN 10 12/23/2016 0446   BUN 6 12/22/2016 2008   BUN 16.8 12/18/2016 0932   BUN 17.6 12/11/2016 1437   BUN 13.6 2020/07/3117 1145   CREATININE 0.70 12/25/2016 0427   CREATININE 0.86 12/23/2016 0446   CREATININE 0.95 12/22/2016 2008   CREATININE 1.7 (H) 12/18/2016 0932   CREATININE 1.6 (H) 12/11/2016 1437   CREATININE 1.2 (H) 2020/07/3117 1145   CALCIUM 9.0 12/25/2016 0427   CALCIUM 9.9 12/23/2016 0446   CALCIUM 10.0 12/22/2016 2008   CALCIUM 9.9 12/18/2016 0932   CALCIUM 10.3 12/11/2016 1437   CALCIUM 9.9 2020/07/3117 1145   GFRNONAA >60 12/25/2016 0427   GFRNONAA >60 12/23/2016 0446   GFRNONAA 60 (L) 12/22/2016 2008   GFRAA >60 12/25/2016 0427   GFRAA >60 12/23/2016 0446   GFRAA >60 12/22/2016 2008   CBC    Component Value Date/Time   WBC 5.2 12/25/2016 0427   RBC 4.54 12/25/2016 0427   HGB 13.1 12/25/2016 0427   HGB 14.0 12/18/2016 0932   HCT 40.6 12/25/2016 0427   HCT 42.8 12/18/2016 0932   PLT 210 12/25/2016 0427   PLT 221 12/18/2016 0932  MCV 89.4 12/25/2016 0427   MCV 92.0 12/18/2016 0932   MCH 28.9 12/25/2016 0427   MCHC 32.3 12/25/2016 0427   RDW 16.2 (H) 12/25/2016 0427   RDW 17.6 (H) 12/18/2016 0932   LYMPHSABS 1.2 12/22/2016 2008   LYMPHSABS 1.2 12/18/2016 0932   MONOABS 1.0 12/22/2016 2008   MONOABS 0.7 12/18/2016 0932   EOSABS 0.0 12/22/2016 2008   EOSABS 0.1 12/18/2016 0932   BASOSABS 0.0 12/22/2016 2008   BASOSABS 0.1 12/18/2016 0932   HEPATIC Function Panel  Recent Labs  12/11/16 1437 12/18/16 0932 12/22/16 2008  PROT 6.6 6.4 6.2*   HEMOGLOBIN A1C No components found for: HGA1C,  MPG CARDIAC ENZYMES Lab Results  Component Value Date   CKTOTAL 129 01/01/2012   CKMB 4.6 (H) 01/01/2012   TROPONINI 0.04 (HH) 12/23/2016   TROPONINI 0.06 (HH) 12/23/2016   TROPONINI 0.06 (HH) 12/22/2016   BNP No results for input(s): PROBNP in  the last 8760 hours. TSH  Recent Labs  12/06/16 2005 12/18/16 1056 12/22/16 2008  TSH 0.541 0.576 0.481   CHOLESTEROL  Recent Labs  12/07/16 0605  CHOL 220*    Scheduled Meds: . amLODipine  5 mg Oral Daily  . aspirin EC  81 mg Oral Daily  . calcium carbonate  1 tablet Oral Q breakfast  . DULoxetine  20 mg Oral Daily  . feeding supplement (ENSURE ENLIVE)  237 mL Oral QID  . metoprolol  5 mg Intravenous Once  . metoprolol tartrate  50 mg Oral BID  . mirtazapine  15 mg Oral QHS  . pantoprazole  40 mg Oral BID AC  . potassium chloride  10 mEq Oral BID  . pravastatin  40 mg Oral QPM  . sodium chloride flush  3 mL Intravenous Q12H  . ticagrelor  90 mg Oral BID   Continuous Infusions: . heparin 750 Units/hr (12/28/16 1316)   PRN Meds:.sodium chloride, acetaminophen, ondansetron (ZOFRAN) IV, polyethylene glycol, sodium chloride flush, traMADol  Assessment/Plan:  Acute coronary syndrome Multivessel CAD Multiple myeloma GERD Abnormal weight loss Depression  Start IV heparin. LAD/Diagonal stent in AM.   LOS: 4 days    [name]  MD  12/28/2016, 4:41 PM

## 2016-12-28 NOTE — Progress Notes (Signed)
ANTICOAGULATION CONSULT NOTE - Initial Consult  Pharmacy Consult for heparin Indication: chest pain/ACS  Allergies  Allergen Reactions  . Sulfa Antibiotics Rash    Patient Measurements: Height: 5\' 5"  (165.1 cm) Weight: 134 lb 8 oz (61 kg) (scale a) IBW/kg (Calculated) : 57 Heparin Dosing Weight: 61 kg   Vital Signs: Temp: 97.8 F (36.6 C) (03/11 1233) Temp Source: Oral (03/11 1233) BP: 171/82 (03/11 1233) Pulse Rate: 50 (03/11 1233)  Labs: No results for input(s): HGB, HCT, PLT, APTT, LABPROT, INR, HEPARINUNFRC, HEPRLOWMOCWT, CREATININE, CKTOTAL, CKMB, TROPONINI in the last 72 hours.  Estimated Creatinine Clearance: 58.9 mL/min (by C-G formula based on SCr of 0.7 mg/dL).    Assessment: 71 yo female who presented on 3/5 with chest pain. She underwent cardiac cath on 3/7 which showed multivessel disease. Planned stent intervention. Patient was not receiving any anticoagulants prior to admission. Patient has been receiving SQ heparin with the last dose this AM at 0641. Last CBC on 3/8 was stable. No signs of bleeding noted.    Goal of Therapy:  Heparin level 0.3-0.7 units/ml Monitor platelets by anticoagulation protocol: Yes   Plan:  Heparin 3000 unit bolus Heparin 750 units/hr  6 hour heparin level and CBC Daily heparin level/CBC  Ihor Austin, PharmD PGY1 Pharmacy Resident Pager: [phone] 12/28/2016,12:40 PM

## 2016-12-29 ENCOUNTER — Encounter (HOSPITAL_COMMUNITY): Payer: Self-pay | Admitting: Cardiology

## 2016-12-29 ENCOUNTER — Encounter (HOSPITAL_COMMUNITY)
Admission: AD | Disposition: A | Discharge status: Skilled Nursing Facility | Payer: Self-pay | Source: Ambulatory Visit | Attending: Cardiovascular Disease

## 2016-12-29 HISTORY — PX: CORONARY STENT INTERVENTION: CATH118234

## 2016-12-29 LAB — CBC
HCT: 46.9 % — ABNORMAL HIGH (ref 36.0–46.0)
Hemoglobin: 15.8 g/dL — ABNORMAL HIGH (ref 12.0–15.0)
MCH: 30.6 pg (ref 26.0–34.0)
MCHC: 33.7 g/dL (ref 30.0–36.0)
MCV: 90.9 fL (ref 78.0–100.0)
PLATELETS: 197 10*3/uL (ref 150–400)
RBC: 5.16 MIL/uL — ABNORMAL HIGH (ref 3.87–5.11)
RDW: 17 % — AB (ref 11.5–15.5)
WBC: 5.3 10*3/uL (ref 4.0–10.5)

## 2016-12-29 LAB — BASIC METABOLIC PANEL
ANION GAP: 11 (ref 5–15)
BUN: 17 mg/dL (ref 6–20)
CALCIUM: 9.7 mg/dL (ref 8.9–10.3)
CO2: 19 mmol/L — ABNORMAL LOW (ref 22–32)
CREATININE: 0.95 mg/dL (ref 0.44–1.00)
Chloride: 110 mmol/L (ref 101–111)
GFR calc Af Amer: >60 mL/min (ref >60)
GFR, EST NON AFRICAN AMERICAN: 59 mL/min — AB (ref >60)
Glucose, Bld: 105 mg/dL — ABNORMAL HIGH (ref 65–99)
Potassium: 4.5 mmol/L (ref 3.5–5.1)
Sodium: 140 mmol/L (ref 135–145)

## 2016-12-29 LAB — APTT

## 2016-12-29 LAB — HEPARIN LEVEL (UNFRACTIONATED): Heparin Unfractionated: 1.9 IU/mL — ABNORMAL HIGH (ref 0.30–0.70)

## 2016-12-29 SURGERY — CORONARY STENT INTERVENTION
Anesthesia: LOCAL

## 2016-12-29 MED ORDER — HEPARIN (PORCINE) IN NACL 2-0.9 UNIT/ML-% IJ SOLN
INTRAMUSCULAR | Status: DC | PRN
  Administered 2016-12-29: 2000 mL

## 2016-12-29 MED ORDER — SODIUM CHLORIDE 0.9 % IV SOLN: 250.0000 mL | INTRAVENOUS | Status: DC | PRN

## 2016-12-29 MED ORDER — FENTANYL CITRATE (PF) 100 MCG/2ML IJ SOLN
INTRAMUSCULAR | Status: AC
  Filled 2016-12-29: qty 2

## 2016-12-29 MED ORDER — ANGIOPLASTY BOOK
Freq: Once | Status: DC
  Filled 2016-12-29: qty 1

## 2016-12-29 MED ORDER — HEPARIN (PORCINE) IN NACL 2-0.9 UNIT/ML-% IJ SOLN
INTRAMUSCULAR | Status: AC
  Filled 2016-12-29: qty 1500

## 2016-12-29 MED ORDER — LABETALOL HCL 5 MG/ML IV SOLN: 10.0000 mg | INTRAVENOUS | Status: AC | PRN

## 2016-12-29 MED ORDER — BIVALIRUDIN BOLUS VIA INFUSION - CUPID
INTRAVENOUS | Status: DC | PRN
  Administered 2016-12-29: 46.65 mg via INTRAVENOUS

## 2016-12-29 MED ORDER — SODIUM CHLORIDE 0.9 % IV SOLN
INTRAVENOUS | Status: DC
Start: 2016-12-29 — End: 2016-12-29
  Administered 2016-12-29: 08:00:00 via INTRAVENOUS

## 2016-12-29 MED ORDER — NITROGLYCERIN IN D5W 200-5 MCG/ML-% IV SOLN
INTRAVENOUS | Status: DC | PRN
  Administered 2016-12-29: 10 ug/min via INTRAVENOUS

## 2016-12-29 MED ORDER — NITROGLYCERIN 1 MG/10 ML FOR IR/CATH LAB
INTRA_ARTERIAL | Status: AC
  Filled 2016-12-29: qty 10

## 2016-12-29 MED ORDER — IOPAMIDOL (ISOVUE-370) INJECTION 76%
INTRAVENOUS | Status: AC
Start: 2016-12-29 — End: 2016-12-29
  Filled 2016-12-29: qty 125

## 2016-12-29 MED ORDER — TICAGRELOR 90 MG PO TABS
ORAL_TABLET | ORAL | Status: AC
  Filled 2016-12-29: qty 1

## 2016-12-29 MED ORDER — MIDAZOLAM HCL 2 MG/2ML IJ SOLN
INTRAMUSCULAR | Status: AC
  Filled 2016-12-29: qty 2

## 2016-12-29 MED ORDER — SODIUM CHLORIDE 0.9 % IV SOLN
INTRAVENOUS | Status: DC | PRN
  Administered 2016-12-29: 1.75 mg/kg/h via INTRAVENOUS
  Administered 2016-12-29: 13:00:00

## 2016-12-29 MED ORDER — NITROGLYCERIN 1 MG/10 ML FOR IR/CATH LAB
INTRA_ARTERIAL | Status: DC | PRN
  Administered 2016-12-29 (×3): 150 ug via INTRACORONARY
  Administered 2016-12-29: 200 ug via INTRACORONARY

## 2016-12-29 MED ORDER — HYDRALAZINE HCL 20 MG/ML IJ SOLN: 5.0000 mg | INTRAMUSCULAR | Status: AC | PRN

## 2016-12-29 MED ORDER — BIVALIRUDIN 250 MG IV SOLR
INTRAVENOUS | Status: AC
  Filled 2016-12-29: qty 250

## 2016-12-29 MED ORDER — FAMOTIDINE IN NACL 20-0.9 MG/50ML-% IV SOLN
INTRAVENOUS | Status: AC
  Filled 2016-12-29: qty 50

## 2016-12-29 MED ORDER — NITROGLYCERIN IN D5W 200-5 MCG/ML-% IV SOLN: 10.0000 ug/min | INTRAVENOUS | Status: DC

## 2016-12-29 MED ORDER — SODIUM CHLORIDE 0.9 % WEIGHT BASED INFUSION
1.0000 mL/kg/h | INTRAVENOUS | Status: DC
  Administered 2016-12-29 (×2): 250 mL via INTRAVENOUS
  Administered 2016-12-29: 150 mL/h via INTRAVENOUS

## 2016-12-29 MED ORDER — SODIUM CHLORIDE 0.9 % WEIGHT BASED INFUSION
3.0000 mL/kg/h | INTRAVENOUS | Status: DC
  Administered 2016-12-29: 3 mL/kg/h via INTRAVENOUS

## 2016-12-29 MED ORDER — LIDOCAINE HCL (PF) 1 % IJ SOLN
INTRAMUSCULAR | Status: AC
  Filled 2016-12-29: qty 30

## 2016-12-29 MED ORDER — MIDAZOLAM HCL 2 MG/2ML IJ SOLN
INTRAMUSCULAR | Status: DC | PRN
  Administered 2016-12-29: 1 mg via INTRAVENOUS

## 2016-12-29 MED ORDER — FENTANYL CITRATE (PF) 100 MCG/2ML IJ SOLN
INTRAMUSCULAR | Status: DC | PRN
  Administered 2016-12-29 (×2): 25 ug via INTRAVENOUS

## 2016-12-29 MED ORDER — SODIUM CHLORIDE 0.9% FLUSH: 3.0000 mL | INTRAVENOUS | Status: DC | PRN

## 2016-12-29 MED ORDER — SODIUM CHLORIDE 0.9 % WEIGHT BASED INFUSION: 1.0000 mL/kg/h | INTRAVENOUS | Status: AC

## 2016-12-29 MED ORDER — HEPARIN (PORCINE) IN NACL 100-0.45 UNIT/ML-% IJ SOLN: 300.0000 [IU]/h | INTRAMUSCULAR | Status: DC

## 2016-12-29 MED ORDER — NITROGLYCERIN IN D5W 200-5 MCG/ML-% IV SOLN
INTRAVENOUS | Status: AC
  Filled 2016-12-29: qty 250

## 2016-12-29 MED ORDER — SODIUM CHLORIDE 0.9% FLUSH
3.0000 mL | Freq: Two times a day (BID) | INTRAVENOUS | Status: DC
  Administered 2016-12-29 – 2017-01-02 (×8): 3 mL via INTRAVENOUS

## 2016-12-29 MED ORDER — TICAGRELOR 90 MG PO TABS
ORAL_TABLET | ORAL | Status: DC | PRN
  Administered 2016-12-29: 90 mg via ORAL

## 2016-12-29 MED ORDER — SODIUM CHLORIDE 0.9% FLUSH
3.0000 mL | Freq: Two times a day (BID) | INTRAVENOUS | Status: DC
  Administered 2016-12-29: 3 mL via INTRAVENOUS

## 2016-12-29 MED ORDER — ASPIRIN 81 MG PO CHEW: 81.0000 mg | CHEWABLE_TABLET | ORAL | Status: DC

## 2016-12-29 MED ORDER — IOPAMIDOL (ISOVUE-370) INJECTION 76%
INTRAVENOUS | Status: AC
  Filled 2016-12-29: qty 50

## 2016-12-29 MED ORDER — FAMOTIDINE IN NACL 20-0.9 MG/50ML-% IV SOLN
INTRAVENOUS | Status: DC | PRN
  Administered 2016-12-29: 20 mg via INTRAVENOUS

## 2016-12-29 MED ORDER — IOPAMIDOL (ISOVUE-370) INJECTION 76%
INTRAVENOUS | Status: DC | PRN
  Administered 2016-12-29: 145 mL via INTRA_ARTERIAL

## 2016-12-29 SURGICAL SUPPLY — 30 items
BALLN ANGIOSCULPT RX 2.0X6 (BALLOONS) ×2
BALLN EMERGE MR 3.0X15 (BALLOONS) ×2
BALLN EMERGE MR PUSH 1.20X20 (BALLOONS) ×2 IMPLANT
BALLN EMERGE MR PUSH 1.5X12 (BALLOONS) ×2
BALLN ~~LOC~~ EMERGE MR 2.5X15 (BALLOONS) ×2
BALLN ~~LOC~~ EMERGE MR 3.0X12 (BALLOONS) ×2
BALLN ~~LOC~~ EMERGE MR 3.25X20 (BALLOONS) ×2
BALLOON ANGIOSCULPT RX 2.0X6 (BALLOONS) IMPLANT
BALLOON EMERGE MR 3.0X15 (BALLOONS) IMPLANT
BALLOON EMERGE MR PUSH 1.5X12 (BALLOONS) IMPLANT
BALLOON ~~LOC~~ EMERGE MR 2.5X15 (BALLOONS) IMPLANT
BALLOON ~~LOC~~ EMERGE MR 3.0X12 (BALLOONS) IMPLANT
BALLOON ~~LOC~~ EMERGE MR 3.25X20 (BALLOONS) IMPLANT
CATH MICROGUIDE FINCRSS 150 CM (MICROCATHETER) IMPLANT
GUIDE CATH MACH 1 7F VL3 (CATHETERS) ×1 IMPLANT
KIT ENCORE 26 ADVANTAGE (KITS) ×3 IMPLANT
KIT HEART LEFT (KITS) ×2 IMPLANT
MICROGUIDE FINECROSS 150 CM (MICROCATHETER) ×2
PACK CARDIAC CATHETERIZATION (CUSTOM PROCEDURE TRAY) ×2 IMPLANT
SHEATH PINNACLE 6F 10CM (SHEATH) IMPLANT
SHEATH PINNACLE 7F 10CM (SHEATH) ×1 IMPLANT
STENT XIENCE ALPINE RX 2.25X8 (Permanent Stent) ×1 IMPLANT
STENT XIENCE ALPINE RX 3.0X23 (Permanent Stent) ×1 IMPLANT
TRANSDUCER W/STOPCOCK (MISCELLANEOUS) ×2 IMPLANT
TUBING CIL FLEX 10 FLL-RA (TUBING) ×2 IMPLANT
WIRE ASAHI PROWATER 180CM (WIRE) ×1 IMPLANT
WIRE EMERALD 3MM-J .035X150CM (WIRE) ×1 IMPLANT
WIRE PT2 LS 185 (WIRE) ×1 IMPLANT
WIRE PT2 MS 185 (WIRE) ×1 IMPLANT
WIRE RUNTHROUGH .014X180CM (WIRE) ×1 IMPLANT

## 2016-12-29 NOTE — Progress Notes (Signed)
Site area: right groin  Site Prior to Removal:  Level 0  Pressure Applied For 30 MINUTES    Minutes Beginning at 1820  Manual:   Yes.    Patient Status During Pull:  Patient remained A&O by 4, some oozing noted and hypotension noted, 200cc bolus infused and nitro gtt stopped during sheath pull. Patient remained asymptomatic during sheath pull. Right groin site remains soft to touch, no oozing, no bleeding. No s/s of distress noted or complaints voiced. Pressure dressing applied. Post removal instructions provided as well as bedrest instructions.   Post Pull Groin Site:  Level 0  Post Pull Instructions Given:  Yes.    Post Pull Pulses Present:  Yes.    Dressing Applied:  Yes.    Comments:  Call bell is in reach and bed is in lowest position.

## 2016-12-29 NOTE — Progress Notes (Signed)
Subjective:  Doing well denies any chest pain or shortness of breath.  Objective:  Vital Signs in the last 24 hours: Temp:  [97.5 F (36.4 C)-98.7 F (37.1 C)] 97.5 F (36.4 C) (03/12 1422) Pulse Rate:  [0-76] 66 (03/12 1430) Resp:  [0-117] 16 (03/12 1430) BP: (120-209)/(54-137) 125/85 (03/12 1430) SpO2:  [0 %-100 %] 98 % (03/12 1430) Weight:  [137 lb 3.2 oz (62.2 kg)] 137 lb 3.2 oz (62.2 kg) (03/12 0430)  Intake/Output from previous day: 03/11 0701 - 03/12 0700 In: 961.8 [P.O.:900; I.V.:61.8] Out: 350 [Urine:350] Intake/Output from this shift: No intake/output data recorded.  Physical Exam: Neck: no adenopathy, no carotid bruit, no JVD and supple, symmetrical, trachea midline Lungs: clear to auscultation bilaterally Heart: regular rate and rhythm, S1, S2 normal and Soft systolic murmur noted Abdomen: soft, non-tender; bowel sounds normal; no masses,  no organomegaly Extremities: extremities normal, atraumatic, no cyanosis or edema Right groin stable Lab Results:  Recent Labs  12/28/16 1914 12/29/16 0519  WBC 4.9 5.3  HGB 15.3* 15.8*  PLT 236 197    Recent Labs  12/29/16 0519  NA 140  K 4.5  CL 110  CO2 19*  GLUCOSE 105*  BUN 17  CREATININE 0.95   No results for input(s): TROPONINI in the last 72 hours.  Invalid input(s): CK, MB Hepatic Function Panel No results for input(s): PROT, ALBUMIN, AST, ALT, ALKPHOS, BILITOT, BILIDIR, IBILI in the last 72 hours. No results for input(s): CHOL in the last 72 hours. No results for input(s): PROTIME in the last 72 hours.  Imaging: Imaging results have been reviewed and No results found.  Cardiac Studies:  Assessment/Plan:  Acute coronary syndrome status post PTCA stenting to proximal LAD and ostial diagonal 2 with distal stent edge dissection/wire dissection and diagonal 2 with TIMI 1 flow Multivessel CAD Multiple myeloma GERD Abnormal weight loss Depression Plan Continue present management Check labs and  EKG in a.m. Check troponin I in a.m. Medical management  LOS: 5 days    Abigail [name] 12/29/2016, 2:56 PM

## 2016-12-29 NOTE — Care Management Note (Addendum)
Case Management Note  Patient Details  Name: MELINDA GWINNER MRN: [id number] Date of Birth: Oct 14, 1946  Subjective/Objective:    S/p coronary stent intervention, will be on brilinta, patient has 30 day savings card from this weekend, but did not see a benefit check,  NCM awaiting benefit check for brilinta. Also patient is active with [facility] for [place].  Per Cardiac rehab, patient unsafe to ambulate by herself and she lives alone, await pt/ot eval.    NCM will cont to follow for dc needs.   Per rep at [facility] is covered, no auth required. Co-pay is $47 at retail for 30 day supply               Action/Plan:   Expected Discharge Date:  12/27/16               Expected Discharge Plan:  [place]  In-House Referral:     Discharge planning Services  CM Consult  Post Acute Care Choice:  [place], Resumption of Svcs/PTA Provider Choice offered to:     DME Arranged:    DME Agency:     HH Arranged:  RN, PT [place] Agency:  [place]  Status of Service:  In process, will continue to follow  If discussed at Long Length of Stay Meetings, dates discussed:    Additional Comments:  Zenon Mayo, RN 12/29/2016, 6:13 PM

## 2016-12-29 NOTE — Progress Notes (Signed)
Bed alarm is active on bed and patient has attempted to get out of bed twice while sheath is in place. Reminded patient that she can not get up or sit up in bed. Teach back used, patient voiced understanding, however has still attempted to sit up and get up. At this time patient is lying in bed flat, no S/S of distress noted or complaints voiced. Right groin sheath remains in place, no hematoma, no bleeding noted. Area remains soft to touch. Will continue to monitor. Call bell is in reach and bed is in lowest position. Patient's stent card is on chart.

## 2016-12-29 NOTE — Interval H&P Note (Signed)
Cath Lab Visit (complete for each Cath Lab visit)  Clinical Evaluation Leading to the Procedure:   ACS: Yes.    Non-ACS:    Anginal Classification: CCS IV  Anti-ischemic medical therapy: Maximal Therapy (2 or more classes of medications)  Non-Invasive Test Results: No non-invasive testing performed  Prior CABG: No previous CABG      History and Physical Interval Note:  12/29/2016 11:22 AM  Abigail [name]  has presented today for surgery, with the diagnosis of CAD  The various methods of treatment have been discussed with the patient and family. After consideration of risks, benefits and other options for treatment, the patient has consented to  Procedure(s): Coronary Stent Intervention (N/A) as a surgical intervention .  The patient's history has been reviewed, patient examined, no change in status, stable for surgery.  I have reviewed the patient's chart and labs.  Questions were answered to the patient's satisfaction.     Charolette Forward

## 2016-12-29 NOTE — H&P (View-Only) (Signed)
Ref: Philis Fendt, MD   Subjective:  Patient remains depressed. Not eating much. Did not walk today.   Objective:  Vital Signs in the last 24 hours: Temp:  [98.2 F (36.8 C)-98.4 F (36.9 C)] 98.2 F (36.8 C) (03/09 0703) Pulse Rate:  [64-70] 70 (03/09 0703) Cardiac Rhythm: Supraventricular tachycardia (03/09 1006) Resp:  [18] 18 (03/09 0703) BP: (138-140)/(80-85) 138/80 (03/09 0703) SpO2:  [100 %] 100 % (03/09 0703) Weight:  [61.9 kg (136 lb 8 oz)] 61.9 kg (136 lb 8 oz) (03/09 0703)  Physical Exam: BP Readings from Last 1 Encounters:  12/26/16 138/80    Wt Readings from Last 1 Encounters:  12/26/16 61.9 kg (136 lb 8 oz)    Weight change:  Body mass index is 22.71 kg/m. HEENT: [place]/AT, Eyes-Brown, PERL, EOMI, Conjunctiva-Pink, Sclera-Non-icteric Neck: No JVD, No bruit, Trachea midline. Lungs:  Clear, Bilateral. Cardiac:  Regular rhythm, normal S1 and S2, no S3. II/VI systolic murmur. Abdomen:  Soft, non-tender. BS present. Extremities:  No edema present. No cyanosis. No clubbing. CNS: AxOx3, Cranial nerves grossly intact, moves all 4 extremities.  Skin: Warm and dry.   Intake/Output from previous day: 03/08 0701 - 03/09 0700 In: 700 [P.O.:600] Out: 800 [Urine:800]    Lab Results: BMET    Component Value Date/Time   NA 137 12/25/2016 0427   NA 139 12/23/2016 0446   NA 140 12/22/2016 2008   NA 140 12/18/2016 0932   NA 140 12/11/2016 1437   NA 139 22-Nov-202018 1145   K 3.4 (L) 12/25/2016 0427   K 3.5 12/23/2016 0446   K 3.5 12/22/2016 2008   K 4.9 12/18/2016 0932   K 4.2 12/11/2016 1437   K 3.9 22-Nov-202018 1145   CL 105 12/25/2016 0427   CL 105 12/23/2016 0446   CL 103 12/22/2016 2008   CO2 19 (L) 12/25/2016 0427   CO2 25 12/23/2016 0446   CO2 24 12/22/2016 2008   CO2 24 12/18/2016 0932   CO2 22 12/11/2016 1437   CO2 21 (L) 22-Nov-202018 1145   GLUCOSE 89 12/25/2016 0427   GLUCOSE 105 (H) 12/23/2016 0446   GLUCOSE 100 (H) 12/22/2016 2008   GLUCOSE 138  12/18/2016 0932   GLUCOSE 119 12/11/2016 1437   GLUCOSE 123 22-Nov-202018 1145   BUN 6 12/25/2016 0427   BUN 10 12/23/2016 0446   BUN 6 12/22/2016 2008   BUN 16.8 12/18/2016 0932   BUN 17.6 12/11/2016 1437   BUN 13.6 22-Nov-202018 1145   CREATININE 0.70 12/25/2016 0427   CREATININE 0.86 12/23/2016 0446   CREATININE 0.95 12/22/2016 2008   CREATININE 1.7 (H) 12/18/2016 0932   CREATININE 1.6 (H) 12/11/2016 1437   CREATININE 1.2 (H) 22-Nov-202018 1145   CALCIUM 9.0 12/25/2016 0427   CALCIUM 9.9 12/23/2016 0446   CALCIUM 10.0 12/22/2016 2008   CALCIUM 9.9 12/18/2016 0932   CALCIUM 10.3 12/11/2016 1437   CALCIUM 9.9 22-Nov-202018 1145   GFRNONAA >60 12/25/2016 0427   GFRNONAA >60 12/23/2016 0446   GFRNONAA 60 (L) 12/22/2016 2008   GFRAA >60 12/25/2016 0427   GFRAA >60 12/23/2016 0446   GFRAA >60 12/22/2016 2008   CBC    Component Value Date/Time   WBC 5.2 12/25/2016 0427   RBC 4.54 12/25/2016 0427   HGB 13.1 12/25/2016 0427   HGB 14.0 12/18/2016 0932   HCT 40.6 12/25/2016 0427   HCT 42.8 12/18/2016 0932   PLT 210 12/25/2016 0427   PLT 221 12/18/2016 0932   MCV  89.4 12/25/2016 0427   MCV 92.0 12/18/2016 0932   MCH 28.9 12/25/2016 0427   MCHC 32.3 12/25/2016 0427   RDW 16.2 (H) 12/25/2016 0427   RDW 17.6 (H) 12/18/2016 0932   LYMPHSABS 1.2 12/22/2016 2008   LYMPHSABS 1.2 12/18/2016 0932   MONOABS 1.0 12/22/2016 2008   MONOABS 0.7 12/18/2016 0932   EOSABS 0.0 12/22/2016 2008   EOSABS 0.1 12/18/2016 0932   BASOSABS 0.0 12/22/2016 2008   BASOSABS 0.1 12/18/2016 0932   HEPATIC Function Panel  Recent Labs  12/11/16 1437 12/18/16 0932 12/22/16 2008  PROT 6.6 6.4 6.2*   HEMOGLOBIN A1C No components found for: HGA1C,  MPG CARDIAC ENZYMES Lab Results  Component Value Date   CKTOTAL 129 01/01/2012   CKMB 4.6 (H) 01/01/2012   TROPONINI 0.04 (HH) 12/23/2016   TROPONINI 0.06 (HH) 12/23/2016   TROPONINI 0.06 (HH) 12/22/2016   BNP No results for input(s): PROBNP in the last  8760 hours. TSH  Recent Labs  12/06/16 2005 12/18/16 1056 12/22/16 2008  TSH 0.541 0.576 0.481   CHOLESTEROL  Recent Labs  12/07/16 0605  CHOL 220*    Scheduled Meds: . amLODipine  5 mg Oral Daily  . aspirin EC  81 mg Oral Daily  . calcium carbonate  1 tablet Oral Q breakfast  . DULoxetine  20 mg Oral Daily  . feeding supplement (ENSURE ENLIVE)  237 mL Oral QID  . heparin  5,000 Units Subcutaneous Q8H  . metoprolol  5 mg Intravenous Once  . metoprolol tartrate  50 mg Oral BID  . mirtazapine  15 mg Oral QHS  . pantoprazole  40 mg Oral BID AC  . potassium chloride  10 mEq Oral BID  . pravastatin  40 mg Oral QPM  . sodium chloride flush  3 mL Intravenous Q12H  . ticagrelor  90 mg Oral BID   Continuous Infusions: PRN Meds:.sodium chloride, acetaminophen, ondansetron (ZOFRAN) IV, polyethylene glycol, sodium chloride flush, traMADol  Assessment/Plan: Multivessel CAD Hypertension Multiple myeloma GERD Abnormal weight loss Depression  Add Remeron. Encourage feeding.   LOS: 2 days    [name]  MD  12/26/2016, 6:17 PM

## 2016-12-29 NOTE — Progress Notes (Signed)
ANTICOAGULATION CONSULT NOTE - Follow Up Consult  Pharmacy Consult for heparin Indication: CAD  Labs:  Recent Labs  12/28/16 1914 12/28/16 2229  HGB 15.3*  --   HCT 46.1*  --   PLT 236  --   HEPARINUNFRC >2.20* >2.20*    Assessment: 71yo female above goal on heparin with initial dosing for CAD, lab was drawn from same arm as heparin initially but on second draw RN witnessed phlebotomy drawing from opposite arm.  Goal of Therapy:  Heparin level 0.3-0.7 units/ml   Plan:  Will hold heparin gtt x80min then resume at rate 4 units/kg/hr lower (500 units/hr) and check level in 6hr.  Wynona Neat, PharmD, BCPS  12/29/2016,12:00 AM

## 2016-12-29 NOTE — Progress Notes (Signed)
ANTICOAGULATION CONSULT NOTE - Follow Up Consult  Pharmacy Consult for heparin Indication: CAD  Labs:  Recent Labs  12/28/16 1914 12/28/16 2229 12/29/16 0519 12/29/16 0750  HGB 15.3*  --  15.8*  --   HCT 46.1*  --  46.9*  --   PLT 236  --  197  --   HEPARINUNFRC >2.20* >2.20*  --  1.90*  CREATININE  --   --  0.95  --     Assessment: 71yo female on IV heparin for ACS, heparin level supratherapeutic at 1.9 after rate reduced to 500 units/hr over night. Confirmed with patient level was drawn from opposite arm. Plan for cath this morning. CBC stable. No bleeding noted.  Goal of Therapy:  Heparin level 0.3-0.7 units/ml   Plan:  Will hold heparin gtt for 2 hours.  If not going to cath this morning, will check aPTT/anti-Xa level before restarting   Abigail [name], PharmD, BCPS  Clinical Pharmacist  Pager: [phone]   12/29/2016,9:37 AM

## 2016-12-30 LAB — BASIC METABOLIC PANEL
Anion gap: 9 (ref 5–15)
BUN: 15 mg/dL (ref 6–20)
CO2: 22 mmol/L (ref 22–32)
CREATININE: 0.9 mg/dL (ref 0.44–1.00)
Calcium: 8.9 mg/dL (ref 8.9–10.3)
Chloride: 112 mmol/L — ABNORMAL HIGH (ref 101–111)
Glucose, Bld: 126 mg/dL — ABNORMAL HIGH (ref 65–99)
POTASSIUM: 4.5 mmol/L (ref 3.5–5.1)
SODIUM: 143 mmol/L (ref 135–145)

## 2016-12-30 LAB — CBC
HEMATOCRIT: 33.6 % — AB (ref 36.0–46.0)
Hemoglobin: 10.8 g/dL — ABNORMAL LOW (ref 12.0–15.0)
MCH: 29.2 pg (ref 26.0–34.0)
MCHC: 32.1 g/dL (ref 30.0–36.0)
MCV: 90.8 fL (ref 78.0–100.0)
PLATELETS: 213 10*3/uL (ref 150–400)
RBC: 3.7 MIL/uL — AB (ref 3.87–5.11)
RDW: 16.7 % — AB (ref 11.5–15.5)
WBC: 7.7 10*3/uL (ref 4.0–10.5)

## 2016-12-30 LAB — POCT ACTIVATED CLOTTING TIME: Activated Clotting Time: 483 seconds

## 2016-12-30 LAB — TROPONIN I: Troponin I: 14.74 ng/mL (ref <0.03)

## 2016-12-30 NOTE — Progress Notes (Signed)
Ref: Abigail Fendt, MD   Subjective:  Feeling better. Flat affect per nurse. Needs help with ambulation.  Objective:  Vital Signs in the last 24 hours: Temp:  [97.4 F (36.3 C)-98 F (36.7 C)] 98 F (36.7 C) (03/13 0700) Pulse Rate:  [0-76] 57 (03/13 0700) Cardiac Rhythm: Sinus bradycardia (03/13 0620) Resp:  [0-117] 15 (03/13 0700) BP: (63-209)/(49-137) 153/89 (03/13 0700) SpO2:  [0 %-100 %] 98 % (03/13 0700) Weight:  [61.3 kg (135 lb 2.3 oz)] 61.3 kg (135 lb 2.3 oz) (03/13 8502)  Physical Exam: BP Readings from Last 1 Encounters:  12/30/16 (!) 153/89    Wt Readings from Last 1 Encounters:  12/30/16 61.3 kg (135 lb 2.3 oz)    Weight change: -0.934 kg (-2 lb 0.9 oz) Body mass index is 22.49 kg/m. HEENT: [place]/AT, Eyes-Brown, PERL, EOMI, Conjunctiva-Pale pink, Sclera-Non-icteric Neck: No JVD, No bruit, Trachea midline. Lungs:  Clear, Bilateral. Cardiac:  Regular rhythm, normal S1 and S2, no S3. II/VI systolic murmur. Abdomen:  Soft, non-tender. BS present. Extremities:  No edema present. No cyanosis. No clubbing. CNS: AxOx3, Cranial nerves grossly intact, moves all 4 extremities.  Skin: Warm and dry.   Intake/Output from previous day: 03/12 0701 - 03/13 0700 In: 1149 [P.O.:255; I.V.:894] Out: 550 [Urine:550]    Lab Results: BMET    Component Value Date/Time   NA 143 12/30/2016 0155   NA 140 12/29/2016 0519   NA 137 12/25/2016 0427   NA 140 12/18/2016 0932   NA 140 12/11/2016 1437   NA 139 Jul 05, 202018 1145   K 4.5 12/30/2016 0155   K 4.5 12/29/2016 0519   K 3.4 (L) 12/25/2016 0427   K 4.9 12/18/2016 0932   K 4.2 12/11/2016 1437   K 3.9 Jul 05, 202018 1145   CL 112 (H) 12/30/2016 0155   CL 110 12/29/2016 0519   CL 105 12/25/2016 0427   CO2 22 12/30/2016 0155   CO2 19 (L) 12/29/2016 0519   CO2 19 (L) 12/25/2016 0427   CO2 24 12/18/2016 0932   CO2 22 12/11/2016 1437   CO2 21 (L) Jul 05, 202018 1145   GLUCOSE 126 (H) 12/30/2016 0155   GLUCOSE 105 (H) 12/29/2016  0519   GLUCOSE 89 12/25/2016 0427   GLUCOSE 138 12/18/2016 0932   GLUCOSE 119 12/11/2016 1437   GLUCOSE 123 Jul 05, 202018 1145   BUN 15 12/30/2016 0155   BUN 17 12/29/2016 0519   BUN 6 12/25/2016 0427   BUN 16.8 12/18/2016 0932   BUN 17.6 12/11/2016 1437   BUN 13.6 Jul 05, 202018 1145   CREATININE 0.90 12/30/2016 0155   CREATININE 0.95 12/29/2016 0519   CREATININE 0.70 12/25/2016 0427   CREATININE 1.7 (H) 12/18/2016 0932   CREATININE 1.6 (H) 12/11/2016 1437   CREATININE 1.2 (H) Jul 05, 202018 1145   CALCIUM 8.9 12/30/2016 0155   CALCIUM 9.7 12/29/2016 0519   CALCIUM 9.0 12/25/2016 0427   CALCIUM 9.9 12/18/2016 0932   CALCIUM 10.3 12/11/2016 1437   CALCIUM 9.9 Jul 05, 202018 1145   GFRNONAA >60 12/30/2016 0155   GFRNONAA 59 (L) 12/29/2016 0519   GFRNONAA >60 12/25/2016 0427   GFRAA >60 12/30/2016 0155   GFRAA >60 12/29/2016 0519   GFRAA >60 12/25/2016 0427   CBC    Component Value Date/Time   WBC 7.7 12/30/2016 0155   RBC 3.70 (L) 12/30/2016 0155   HGB 10.8 (L) 12/30/2016 0155   HGB 14.0 12/18/2016 0932   HCT 33.6 (L) 12/30/2016 0155   HCT 42.8 12/18/2016 0932  PLT 213 12/30/2016 0155   PLT 221 12/18/2016 0932   MCV 90.8 12/30/2016 0155   MCV 92.0 12/18/2016 0932   MCH 29.2 12/30/2016 0155   MCHC 32.1 12/30/2016 0155   RDW 16.7 (H) 12/30/2016 0155   RDW 17.6 (H) 12/18/2016 0932   LYMPHSABS 1.2 12/22/2016 2008   LYMPHSABS 1.2 12/18/2016 0932   MONOABS 1.0 12/22/2016 2008   MONOABS 0.7 12/18/2016 0932   EOSABS 0.0 12/22/2016 2008   EOSABS 0.1 12/18/2016 0932   BASOSABS 0.0 12/22/2016 2008   BASOSABS 0.1 12/18/2016 0932   HEPATIC Function Panel  Recent Labs  12/11/16 1437 12/18/16 0932 12/22/16 2008  PROT 6.6 6.4 6.2*   HEMOGLOBIN A1C No components found for: HGA1C,  MPG CARDIAC ENZYMES Lab Results  Component Value Date   CKTOTAL 129 01/01/2012   CKMB 4.6 (H) 01/01/2012   TROPONINI 14.74 (HH) 12/30/2016   TROPONINI 0.04 (HH) 12/23/2016   TROPONINI 0.06 (HH)  12/23/2016   BNP No results for input(s): PROBNP in the last 8760 hours. TSH  Recent Labs  12/06/16 2005 12/18/16 1056 12/22/16 2008  TSH 0.541 0.576 0.481   CHOLESTEROL  Recent Labs  12/07/16 0605  CHOL 220*    Scheduled Meds: . amLODipine  5 mg Oral Daily  . angioplasty book   Does not apply Once  . aspirin EC  81 mg Oral Daily  . calcium carbonate  1 tablet Oral Q breakfast  . DULoxetine  20 mg Oral Daily  . feeding supplement (ENSURE ENLIVE)  237 mL Oral QID  . metoprolol  5 mg Intravenous Once  . metoprolol tartrate  50 mg Oral BID  . mirtazapine  15 mg Oral QHS  . pantoprazole  40 mg Oral BID AC  . potassium chloride  10 mEq Oral BID  . pravastatin  40 mg Oral QPM  . sodium chloride flush  3 mL Intravenous Q12H  . ticagrelor  90 mg Oral BID   Continuous Infusions: . nitroGLYCERIN 10 mcg/min (12/30/16 0400)   PRN Meds:.sodium chloride, sodium chloride, acetaminophen, ondansetron (ZOFRAN) IV, polyethylene glycol, sodium chloride flush, traMADol  Assessment/Plan: Acute coronary syndrome Multivessel CAD LAD stent Diagonal stent with distal dissection Anemia of blood loss Multiple myeloma GERD Abnormal weight loss Depression  Transfer to Telemetry bed. PT/OT consult.   LOS: 6 days    [name]  MD  12/30/2016, 9:03 AM

## 2016-12-30 NOTE — Progress Notes (Signed)
CARDIAC REHAB PHASE I   PRE:  Rate/Rhythm: 58 SB  BP:  Supine:   Sitting: 132/79  Standing:    SaO2: 100%RA  MODE:  Ambulation: 80 ft   POST:  Rate/Rhythm: 103 ST PACs  BP:  Supine:   Sitting: 119/80  Standing:    SaO2: 100%RA 0750-0840 Pt walked 80 ft on RA with gait belt use, rolling walker and asst x 1 with one asst to follow with wheel chair as pt stated she has to sit quickly sometimes. Pt c/o SOB, weakness and some "mashing in chest". To recliner with asst. Chair alarm used. Pt c/o lightheadedness upon standing. Pt stated she lives alone and has NT 2 to 3 hours a day. Pt is unsafe to ambulate by herself in this deconditioned state. She had HHPT ordered on last admission. Would recommend PT to evaluate again before discharge. BP lower after walk. Troponin at 14.74. Pt stated she has this "mashing in chest" on and off prior to admission. Encouraged her to notify RN if it worsens or does not go away with rest.  Will ed when pt for discharge.   Graylon Good, RN BSN  12/30/2016 8:36 AM

## 2016-12-30 NOTE — Progress Notes (Signed)
Date of notification:  12/30/2016  Time of notification: 03:15  Critical value read back: yes Troponin 14.74  Nurse who received alert:  Vedia Coffer RN  MD notified (1st page):Results texted to Dr Aundra Dubin  Time of first page: 03:16  MD notified (2nd page):  Time of second page:  Responding MD:  Time MD responded:

## 2016-12-30 NOTE — Plan of Care (Signed)
Problem: Education: Goal: Understanding of CV disease, CV risk reduction, and recovery process will improve Outcome: Progressing Denies any pain.Will continue to monitor

## 2016-12-30 NOTE — Progress Notes (Signed)
Nutrition Follow-up  DOCUMENTATION CODES:   Severe malnutrition in context of acute illness/injury  INTERVENTION:   Continue:  Ensure Enlive po QID, each supplement provides 350 kcal and 20 grams of protein Mighty Shake II TID with Breakfast, each supplement provides 480-500 kcals and 20-23 grams of protein Snacks BID Encourage po intake    NUTRITION DIAGNOSIS:   Malnutrition related to acute illness as evidenced by 11 percent weight loss x 1 month, energy intake < or equal to 50% for > or equal to 1 month, moderate depletions of muscle mass, moderate depletion of body fat. Ongoing  GOAL:   Patient will meet greater than or equal to 90% of their needs Progressing.   MONITOR:   PO intake, Supplement acceptance, Skin, Weight trends, Labs, I & O's  ASSESSMENT:   71 year old female with PMH of anemia, CAD, GERD, Hyperlipidemia, hypertension and Multiple myeloma has lost several pounds of weight since started chemotherapy with Velcade injections and Revlimid oral therapy. She presented to office with palpitations and dizziness off and on. EKG showed paroxysmal atrial tachycardia.   3/12 cath lab with LAD stent Meal Completion: 30% Pt with poor appetite now on remeron Per MD pt with depression Pt reports that she is not hungry. States she is drinking ensure but do not see any documented for today. She thinks she drinks 2-3 per day  Diet Order:  Diet Heart Room service appropriate? Yes; Fluid consistency: Thin  Skin:  Reviewed, no issues  Last BM:  3/10  Height:   Ht Readings from Last 1 Encounters:  12/22/16 '5\' 5"'  (1.651 m)    Weight:   Wt Readings from Last 1 Encounters:  12/30/16 135 lb 2.3 oz (61.3 kg)    Ideal Body Weight:  56.8 kg  BMI:  Body mass index is 22.49 kg/m.  Estimated Nutritional Needs:   Kcal:  1900-2100  Protein:  75-88 grams  Fluid:  1.9 L/day  EDUCATION NEEDS:   Education needs addressed  Maylon Peppers RD, [place], [place]  Pager [phone] After Hours Pager

## 2016-12-31 LAB — BASIC METABOLIC PANEL
ANION GAP: 8 (ref 5–15)
BUN: 13 mg/dL (ref 6–20)
CALCIUM: 9.9 mg/dL (ref 8.9–10.3)
CHLORIDE: 112 mmol/L — AB (ref 101–111)
CO2: 21 mmol/L — ABNORMAL LOW (ref 22–32)
CREATININE: 0.81 mg/dL (ref 0.44–1.00)
GFR calc non Af Amer: >60 mL/min (ref >60)
Glucose, Bld: 104 mg/dL — ABNORMAL HIGH (ref 65–99)
Potassium: 4.1 mmol/L (ref 3.5–5.1)
SODIUM: 141 mmol/L (ref 135–145)

## 2016-12-31 LAB — CBC
HCT: 31.5 % — ABNORMAL LOW (ref 36.0–46.0)
HEMOGLOBIN: 10.2 g/dL — AB (ref 12.0–15.0)
MCH: 29.1 pg (ref 26.0–34.0)
MCHC: 32.4 g/dL (ref 30.0–36.0)
MCV: 90 fL (ref 78.0–100.0)
Platelets: 194 10*3/uL (ref 150–400)
RBC: 3.5 MIL/uL — ABNORMAL LOW (ref 3.87–5.11)
RDW: 16.6 % — AB (ref 11.5–15.5)
WBC: 7.3 10*3/uL (ref 4.0–10.5)

## 2016-12-31 LAB — TROPONIN I: Troponin I: 6 ng/mL (ref <0.03)

## 2016-12-31 MED ORDER — DEXTROSE-NACL 5-0.45 % IV SOLN
INTRAVENOUS | Status: AC
  Administered 2016-12-31: 11:00:00 via INTRAVENOUS

## 2016-12-31 MED FILL — Lidocaine HCl Local Preservative Free (PF) Inj 1%: INTRAMUSCULAR | Qty: 30 | Status: AC

## 2016-12-31 NOTE — Care Management Important Message (Signed)
Important Message  Patient Details  Name: Abigail [name] MRN: [id number] Date of Birth: 1946/05/14   Medicare Important Message Given:  Yes    Kinnedy Mongiello [address] 12/31/2016, 11:03 AM

## 2016-12-31 NOTE — Progress Notes (Signed)
Ref: Philis Fendt, MD   Subjective:  Weak, dizzy and hypotensive on standing. No chest pain.  Objective:  Vital Signs in the last 24 hours: Temp:  [98.3 F (36.8 C)-98.6 F (37 C)] 98.6 F (37 C) (03/14 0453) Pulse Rate:  [56-70] 58 (03/14 1406) Cardiac Rhythm: Other (Comment) (03/14 1900) Resp:  [18-20] 20 (03/14 1406) BP: (109-119)/(58-82) 109/62 (03/14 1406) SpO2:  [97 %-100 %] 100 % (03/14 1406) Weight:  [61.9 kg (136 lb 6.4 oz)] 61.9 kg (136 lb 6.4 oz) (03/14 0453)  Physical Exam: BP Readings from Last 1 Encounters:  12/31/16 109/62    Wt Readings from Last 1 Encounters:  12/31/16 61.9 kg (136 lb 6.4 oz)    Weight change: 1 kg (2 lb 3.3 oz) Body mass index is 22.7 kg/m. HEENT: [place]/AT, Eyes-Brown, PERL, EOMI, Conjunctiva-Pale pink, Sclera-Non-icteric Neck: No JVD, No bruit, Trachea midline. Lungs:  Clear, Bilateral. Cardiac:  Regular rhythm, normal S1 and S2, no S3. II/VI systolic murmur. Abdomen:  Soft, non-tender. BS present. Extremities:  No edema present. No cyanosis. No clubbing. CNS: AxOx3, Cranial nerves grossly intact, moves all 4 extremities.  Skin: Warm and dry.   Intake/Output from previous day: 03/13 0701 - 03/14 0700 In: 37 [P.O.:420] Out: 251 [Urine:250; Stool:1]    Lab Results: BMET    Component Value Date/Time   NA 141 12/31/2016 0250   NA 143 12/30/2016 0155   NA 140 12/29/2016 0519   NA 140 12/18/2016 0932   NA 140 12/11/2016 1437   NA 139 May 24, 202018 1145   K 4.1 12/31/2016 0250   K 4.5 12/30/2016 0155   K 4.5 12/29/2016 0519   K 4.9 12/18/2016 0932   K 4.2 12/11/2016 1437   K 3.9 May 24, 202018 1145   CL 112 (H) 12/31/2016 0250   CL 112 (H) 12/30/2016 0155   CL 110 12/29/2016 0519   CO2 21 (L) 12/31/2016 0250   CO2 22 12/30/2016 0155   CO2 19 (L) 12/29/2016 0519   CO2 24 12/18/2016 0932   CO2 22 12/11/2016 1437   CO2 21 (L) May 24, 202018 1145   GLUCOSE 104 (H) 12/31/2016 0250   GLUCOSE 126 (H) 12/30/2016 0155   GLUCOSE 105 (H)  12/29/2016 0519   GLUCOSE 138 12/18/2016 0932   GLUCOSE 119 12/11/2016 1437   GLUCOSE 123 May 24, 202018 1145   BUN 13 12/31/2016 0250   BUN 15 12/30/2016 0155   BUN 17 12/29/2016 0519   BUN 16.8 12/18/2016 0932   BUN 17.6 12/11/2016 1437   BUN 13.6 May 24, 202018 1145   CREATININE 0.81 12/31/2016 0250   CREATININE 0.90 12/30/2016 0155   CREATININE 0.95 12/29/2016 0519   CREATININE 1.7 (H) 12/18/2016 0932   CREATININE 1.6 (H) 12/11/2016 1437   CREATININE 1.2 (H) May 24, 202018 1145   CALCIUM 9.9 12/31/2016 0250   CALCIUM 8.9 12/30/2016 0155   CALCIUM 9.7 12/29/2016 0519   CALCIUM 9.9 12/18/2016 0932   CALCIUM 10.3 12/11/2016 1437   CALCIUM 9.9 May 24, 202018 1145   GFRNONAA >60 12/31/2016 0250   GFRNONAA >60 12/30/2016 0155   GFRNONAA 59 (L) 12/29/2016 0519   GFRAA >60 12/31/2016 0250   GFRAA >60 12/30/2016 0155   GFRAA >60 12/29/2016 0519   CBC    Component Value Date/Time   WBC 7.3 12/31/2016 0250   RBC 3.50 (L) 12/31/2016 0250   HGB 10.2 (L) 12/31/2016 0250   HGB 14.0 12/18/2016 0932   HCT 31.5 (L) 12/31/2016 0250   HCT 42.8 12/18/2016 0932   PLT  194 12/31/2016 0250   PLT 221 12/18/2016 0932   MCV 90.0 12/31/2016 0250   MCV 92.0 12/18/2016 0932   MCH 29.1 12/31/2016 0250   MCHC 32.4 12/31/2016 0250   RDW 16.6 (H) 12/31/2016 0250   RDW 17.6 (H) 12/18/2016 0932   LYMPHSABS 1.2 12/22/2016 2008   LYMPHSABS 1.2 12/18/2016 0932   MONOABS 1.0 12/22/2016 2008   MONOABS 0.7 12/18/2016 0932   EOSABS 0.0 12/22/2016 2008   EOSABS 0.1 12/18/2016 0932   BASOSABS 0.0 12/22/2016 2008   BASOSABS 0.1 12/18/2016 0932   HEPATIC Function Panel  Recent Labs  12/11/16 1437 12/18/16 0932 12/22/16 2008  PROT 6.6 6.4 6.2*   HEMOGLOBIN A1C No components found for: HGA1C,  MPG CARDIAC ENZYMES Lab Results  Component Value Date   CKTOTAL 129 01/01/2012   CKMB 4.6 (H) 01/01/2012   TROPONINI 6.00 (HH) 12/31/2016   TROPONINI 14.74 (HH) 12/30/2016   TROPONINI 0.04 (HH) 12/23/2016    BNP No results for input(s): PROBNP in the last 8760 hours. TSH  Recent Labs  12/06/16 2005 12/18/16 1056 12/22/16 2008  TSH 0.541 0.576 0.481   CHOLESTEROL  Recent Labs  12/07/16 0605  CHOL 220*    Scheduled Meds: . amLODipine  5 mg Oral Daily  . angioplasty book   Does not apply Once  . aspirin EC  81 mg Oral Daily  . calcium carbonate  1 tablet Oral Q breakfast  . DULoxetine  20 mg Oral Daily  . feeding supplement (ENSURE ENLIVE)  237 mL Oral QID  . metoprolol  5 mg Intravenous Once  . metoprolol tartrate  50 mg Oral BID  . mirtazapine  15 mg Oral QHS  . pantoprazole  40 mg Oral BID AC  . potassium chloride  10 mEq Oral BID  . pravastatin  40 mg Oral QPM  . sodium chloride flush  3 mL Intravenous Q12H  . ticagrelor  90 mg Oral BID   Continuous Infusions: . nitroGLYCERIN 10 mcg/min (12/30/16 0400)   PRN Meds:.sodium chloride, sodium chloride, acetaminophen, ondansetron (ZOFRAN) IV, polyethylene glycol, sodium chloride flush, traMADol  Assessment/Plan: Acute coronary syndrome Multivessel CAD LAD stent Diagonal stent with distal dissection Anemia of acute blood loss Multiple myeloma GERD Abnormal weight loss Depression Dehydration  IV fluids. Postpone discharge. Consider SNF.   LOS: 7 days    [name]  MD  12/31/2016, 8:09 PM

## 2016-12-31 NOTE — Care Management Important Message (Signed)
Important Message  Patient Details  Name: MARSHAE AZAM MRN: [id number] Date of Birth: 11-14-1945   Medicare Important Message Given:  Yes    Jahkeem Kurka Montine Circle 12/31/2016, 11:06 AM

## 2016-12-31 NOTE — Progress Notes (Signed)
Notified Dr Doylene Canard patient had symptomatic orthostatia while ambulating with PT/OT today. SBP 74. Order received to increase IVF.

## 2016-12-31 NOTE — Care Management Important Message (Signed)
Important Message  Patient Details  Name: Abigail [name] MRN: [id number] Date of Birth: July 05, 1946   Medicare Important Message Given:  Yes    [name] May 12/31/2016, 11:31 AM

## 2016-12-31 NOTE — Evaluation (Signed)
Physical Therapy Evaluation Patient Details Name: Abigail [name] MRN: [id number] DOB: 1946/08/18 Today's Date: 12/31/2016   History of Present Illness  71 year old female with PMH of anemia, CAD, GERD, Hyperlipidemia, hypertension and Multiple myeloma has lost several pounds of weight since started chemotherapy with Velcade injections and Revlimid oral therapy. She presented to office with palpitations and dizziness off and on. EKG showed paroxysmal atrial tachycardia. She is placed in hospital for further work up and monitoring.  Clinical Impression  Pt admitted with above diagnosis. Pt currently with functional limitations due to the deficits listed below (see PT Problem List).  Pt will benefit from skilled PT to increase their independence and safety with mobility to allow discharge to the venue listed below.  Pt lethargic with flat affect and appears to have no energy, although can move at min/guard level for bed mobility and transfers. Pt orthostatic during session with dizziness. Supine 115/64 HR 59 Sitting 100/69 HR 82 Standing 74/47 HR 103  At this time recommend SNF, as do not feel pt could care for herself at current level.      Follow Up Recommendations SNF;Supervision for mobility/OOB    Equipment Recommendations  None recommended by PT    Recommendations for Other Services       Precautions / Restrictions Precautions Precautions: Fall Precaution Comments: orthostatics Restrictions Weight Bearing Restrictions: No      Mobility  Bed Mobility Overal bed mobility: Needs Assistance Bed Mobility: Supine to Sit     Supine to sit: Min guard     General bed mobility comments: min/guard with increased time. Dizziness upon sitting  Transfers Overall transfer level: Needs assistance Equipment used: None Transfers: Sit to/from [facility] Sit to Stand: Min guard Stand pivot transfers: Min guard       General transfer comment: min/guard for safety.   Orthostatics taken. Pt with dizziness  Ambulation/Gait             General Gait Details: deferred  Stairs            Wheelchair Mobility    Modified Rankin (Stroke Patients Only)       Balance Overall balance assessment: Needs assistance   Sitting balance-Leahy Scale: Good       Standing balance-Leahy Scale: Fair                               Pertinent Vitals/Pain Pain Assessment: No/denies pain    Home Living Family/patient expects to be discharged to:: Private residence Living Arrangements: Alone Available Help at Discharge: Family;Available PRN/intermittently Type of Home: Apartment Home Access: Stairs to enter Entrance Stairs-Rails: None Entrance Stairs-Number of Steps: 2 Home Layout: One level Home Equipment: Walker - 2 wheels;Grab bars - tub/shower      Prior Function Level of Independence: Independent         Comments: was independent, was able to get out of the house     Hand Dominance   Dominant Hand: Right    Extremity/Trunk Assessment   Upper Extremity Assessment Upper Extremity Assessment: Defer to OT evaluation    Lower Extremity Assessment Lower Extremity Assessment: Generalized weakness    Cervical / Trunk Assessment Cervical / Trunk Assessment: Normal  Communication   Communication: No difficulties  Cognition Arousal/Alertness: Lethargic Behavior During Therapy: Flat affect Overall Cognitive Status: Within Functional Limits for tasks assessed  General Comments: Pt kept eyes closed most of session.  Would nod off during conversation.    General Comments General comments (skin integrity, edema, etc.): Pt with uneaten breakfast tray in room. Lunch trays arriving on floor. Encouraged pt to eat lunch.    Exercises     Assessment/Plan    PT Assessment Patient needs continued PT services  PT Problem List Decreased strength;Decreased activity tolerance;Decreased balance;Decreased  mobility;Cardiopulmonary status limiting activity       PT Treatment Interventions DME instruction;Gait training;Stair training;Functional mobility training;Therapeutic activities;Therapeutic exercise;Balance training    PT Goals (Current goals can be found in the Care Plan section)  Acute Rehab PT Goals Patient Stated Goal: None stated PT Goal Formulation: Patient unable to participate in goal setting Time For Goal Achievement: 01/14/17 Potential to Achieve Goals: Fair    Frequency Min 3X/week   Barriers to discharge Decreased caregiver support      Co-evaluation PT/OT/SLP Co-Evaluation/Treatment: Yes Reason for Co-Treatment: For patient/therapist safety PT goals addressed during session: Mobility/safety with mobility         End of Session Equipment Utilized During Treatment: Gait belt Activity Tolerance: Treatment limited secondary to medical complications (Comment) (orthostatic) Patient left: in chair;with call bell/phone within reach;with chair alarm set Nurse Communication: Mobility status PT Visit Diagnosis: Unsteadiness on feet (R26.81);Muscle weakness (generalized) (M62.81)         Time: 3790-2409 PT Time Calculation (min) (ACUTE ONLY): 24 min   Charges:   PT Evaluation $PT Eval Moderate Complexity: 1 Procedure     PT G Codes:         Elva Mauro LUBECK 12/31/2016, 12:29 PM

## 2016-12-31 NOTE — Progress Notes (Signed)
CARDIAC REHAB PHASE I   PRE:  Rate/Rhythm: 65 SR  BP:  Supine: 124/47  Sitting:   Standing:    SaO2: 100%RA  MODE:  Ambulation: halfway to door from bed ft   POST:  Rate/Rhythm: 98 SR  BP:  Supine:   Sitting: 105/64 sitting in chair,  97/68 sitting on side of bed  Standing:    SaO2: 100%RA 1045-1114 Pt only able to walk halfway to door with gait belt use, rolling walker and asst x 2. Pt slow to respond when asked if she was ok. Staring ahead. Asked pt if she needed to sit and she nodded yes. When sitting in chair, stated she was very dizzy and had to sit or she felt as if she would pass out. BP 105/64. Let pt rest and turned chair around to walk back to bed. Pt tearful and stated she had done good with her chemo but the last month and a half has been feeling worse. Does not want to eat and sleeps a lot. Assisted back to bed with bed alarm. BP even lower at 97/68. Pt lives alone. PT to see today. ? If pt would benefit from palliative consult.    Graylon Good, RN BSN  12/31/2016 11:11 AM

## 2016-12-31 NOTE — Evaluation (Signed)
Occupational Therapy Evaluation Patient Details Name: STELLAH DONOVAN MRN: [id number] DOB: 09-14-1946 Today's Date: 12/31/2016    History of Present Illness 71 year old female with PMH of anemia, CAD, GERD, Hyperlipidemia, hypertension and Multiple myeloma has lost several pounds of weight since started chemotherapy with Velcade injections and Revlimid oral therapy. She presented to office with palpitations and dizziness off and on. EKG showed paroxysmal atrial tachycardia. She is placed in hospital for further work up and monitoring.   Clinical Impression   Pt reports she was independent with ADL PTA. Currently pt overall min guard for ADL and functional mobility but limited this session due to orthostatic BP and pt symptomatic. Recommending SNF for follow up to maximize independence and safety with ADL and functional mobility prior to return home alone. Pt would benefit from continued skilled OT to address established goals.  BP supine 115/64 Sitting 100/69 Standing 74/47    Follow Up Recommendations  SNF    Equipment Recommendations  None recommended by OT    Recommendations for Other Services       Precautions / Restrictions Precautions Precautions: Fall Precaution Comments: orthostatics Restrictions Weight Bearing Restrictions: No      Mobility Bed Mobility Overal bed mobility: Needs Assistance Bed Mobility: Supine to Sit     Supine to sit: Min guard     General bed mobility comments: min/guard with increased time. Dizziness upon sitting  Transfers Overall transfer level: Needs assistance Equipment used: None Transfers: Sit to/from [facility] Sit to Stand: Min guard Stand pivot transfers: Min guard       General transfer comment: min/guard for safety.  Orthostatics taken. Pt with dizziness    Balance Overall balance assessment: Needs assistance Sitting-balance support: Feet supported;No upper extremity supported Sitting balance-Leahy Scale:  Good     Standing balance support: No upper extremity supported;During functional activity Standing balance-Leahy Scale: Fair                              ADL Overall ADL's : Needs assistance/impaired     Grooming: Min guard;Sitting   Upper Body Bathing: Min guard;Sitting   Lower Body Bathing: Minimal assistance;Sit to/from stand   Upper Body Dressing : Min guard;Sitting   Lower Body Dressing: Minimal assistance;Sit to/from stand   Toilet Transfer: Min Statistician Details (indicate cue type and reason): bed > chair         Functional mobility during ADLs: Min guard (for stand pivot only) General ADL Comments: BP in supine 115/64, sitting 100/69, standing 74/47. Pt c/o dizziness throughout session; increased in standing.     Vision   Additional Comments: Pt with eyes closed throughout session, reports dizziness worse with eyes open. No nystagmus noted.     Perception     Praxis      Pertinent Vitals/Pain Pain Assessment: No/denies pain     Hand Dominance Right   Extremity/Trunk Assessment Upper Extremity Assessment Upper Extremity Assessment: Overall WFL for tasks assessed   Lower Extremity Assessment Lower Extremity Assessment: Defer to PT evaluation   Cervical / Trunk Assessment Cervical / Trunk Assessment: Normal   Communication Communication Communication: No difficulties   Cognition Arousal/Alertness: Lethargic Behavior During Therapy: Flat affect Overall Cognitive Status: Within Functional Limits for tasks assessed                 General Comments: Pt kept eyes closed most of session.  Would nod off during conversation.  General Comments  Pt with uneaten breakfast tray in room. Lunch trays arriving on floor. Encouraged pt to eat lunch.    Exercises       Shoulder Instructions      Home Living Family/patient expects to be discharged to:: Private residence Living Arrangements: Alone Available  Help at Discharge: Family;Available PRN/intermittently Type of Home: Apartment Home Access: Stairs to enter Entrance Stairs-Number of Steps: 2 Entrance Stairs-Rails: None Home Layout: One level     Bathroom Shower/Tub: Tub/shower unit;Curtain   Biochemist, clinical: Standard     Home Equipment: Environmental consultant - 2 wheels;Grab bars - tub/shower          Prior Functioning/Environment Level of Independence: Independent        Comments: was independent, was able to get out of the house        OT Problem List: Decreased activity tolerance;Impaired balance (sitting and/or standing)      OT Treatment/Interventions: Self-care/ADL training;Energy conservation;DME and/or AE instruction;Therapeutic activities;Patient/family education;Balance training    OT Goals(Current goals can be found in the care plan section) Acute Rehab OT Goals Patient Stated Goal: None stated OT Goal Formulation: With patient Time For Goal Achievement: 01/14/17 Potential to Achieve Goals: Good ADL Goals Pt Will Perform Grooming: with supervision;standing Pt Will Perform Upper Body Bathing: with supervision;sitting Pt Will Perform Lower Body Bathing: with supervision;sit to/from stand Pt Will Transfer to Toilet: with supervision;ambulating;regular height toilet Pt Will Perform Toileting - Clothing Manipulation and hygiene: with supervision;sit to/from stand  OT Frequency: Min 2X/week   Barriers to D/C: Decreased caregiver support  pt lives alone       Co-evaluation PT/OT/SLP Co-Evaluation/Treatment: Yes Reason for Co-Treatment: For patient/therapist safety PT goals addressed during session: Mobility/safety with mobility OT goals addressed during session: ADL's and self-care      End of Session Equipment Utilized During Treatment: Gait belt Nurse Communication: Mobility status;Other (comment) (orthostatic BP)  Activity Tolerance: Other (comment) (limited by dizziness) Patient left: in chair;with call  bell/phone within reach;with chair alarm set  OT Visit Diagnosis: Unsteadiness on feet (R26.81)                ADL either performed or assessed with clinical judgement  Time: 1139-1205 OT Time Calculation (min): 26 min Charges:  OT General Charges $OT Visit: 1 Procedure OT Evaluation $OT Eval Moderate Complexity: 1 Procedure G-Codes:     Karely Hurtado A. Ulice Brilliant, M.S., OTR/L Pager: [place] 12/31/2016, 1:14 PM

## 2016-12-31 NOTE — Progress Notes (Signed)
[facility]  Patient Status: Active (receiving services up to time of hospitalization)  [facility] is providing the following services: RN and PT  If patient discharges after hours, please call [phone].   Abigail [name] 12/31/2016, 10:44 AM

## 2017-01-01 ENCOUNTER — Other Ambulatory Visit: Payer: Medicare HMO

## 2017-01-01 ENCOUNTER — Telehealth: Payer: Self-pay | Admitting: Oncology

## 2017-01-01 ENCOUNTER — Ambulatory Visit: Payer: Medicare HMO | Admitting: Nurse Practitioner

## 2017-01-01 ENCOUNTER — Inpatient Hospital Stay: Payer: Medicare HMO

## 2017-01-01 MED ORDER — DEXTROSE-NACL 5-0.45 % IV SOLN
INTRAVENOUS | Status: DC
  Administered 2017-01-01: 100 mL/h via INTRAVENOUS
  Administered 2017-01-02: 04:00:00 via INTRAVENOUS

## 2017-01-01 MED ORDER — METOPROLOL TARTRATE 25 MG PO TABS
25.0000 mg | ORAL_TABLET | Freq: Two times a day (BID) | ORAL | Status: DC
  Administered 2017-01-02: 25 mg via ORAL
  Filled 2017-01-01 (×2): qty 1

## 2017-01-01 NOTE — NC FL2 (Signed)
[place] MEDICAID FL2 LEVEL OF CARE SCREENING TOOL     IDENTIFICATION  Patient Name: Abigail [name] Birthdate: [birth year] Sex: female Admission Date (Current Location): 12/22/2016  [facility] and [place] Number:  Herbalist and Address:  [facility] [place], [place] [address], [place], [place] [address]      Provider Number: [id number]  Attending Physician Name and Address:  [name], MD  Relative Name and Phone Number:       Current Level of Care: Hospital Recommended Level of Care: [place] Prior Approval Number:    Date Approved/Denied:   PASRR Number: [id number]  Discharge Plan: SNF    Current Diagnoses: Patient Active Problem List   Diagnosis Date Noted  . Acute coronary syndrome ([place]) 12/24/2016  . Dizziness 12/22/2016    Class: Acute  . Orthostatic hypotension 12/08/2016  . Syncope 12/08/2016  . Syncope and collapse 12/06/2016  . Tachycardia-bradycardia ([place])   . Essential hypertension   . Nausea and vomiting in adult patient   . Erosive esophagitis   . Protein-calorie malnutrition, severe 11/19/2016  . LLQ pain   . LUQ abdominal pain   . Intractable nausea and vomiting 11/17/2016  . Abdominal pain 11/05/2016  . Multiple myeloma ([place]) 06/18/2016  . Malnutrition of moderate degree 06/14/2016  . 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 Exertional dyspnea 11/01/2015  . Coronary atherosclerosis of native coronary artery 11/01/2015  . Shortness of breath 11/01/2015  . Chest pain 12/31/2011    Orientation RESPIRATION BLADDER Height & Weight     Self, Time, Situation, Place  Normal Incontinent Weight: 126 lb 8 oz (57.4 kg) Height:  '5\' 5"'  (165.1 cm)  BEHAVIORAL SYMPTOMS/MOOD NEUROLOGICAL BOWEL NUTRITION STATUS      Continent Diet (heart healthy)  AMBULATORY STATUS COMMUNICATION OF NEEDS Skin   Limited Assist Verbally Normal                       Personal Care Assistance Level of  Assistance  Bathing, Feeding, Dressing Bathing Assistance: Limited assistance Feeding assistance: Independent Dressing Assistance: Limited assistance     Functional Limitations Info  Sight, Hearing, Speech Sight Info: Adequate Hearing Info: Adequate Speech Info: Adequate    SPECIAL CARE FACTORS FREQUENCY  PT (By licensed PT), OT (By licensed OT)     PT Frequency: 5x week OT Frequency: 5x week            Contractures Contractures Info: Not present    Additional Factors Info  Code Status, Allergies Code Status Info: full code Allergies Info: SULFA ANTIBIOTICS           Current Medications (01/01/2017):  This is the current hospital active medication list Current Facility-Administered Medications  Medication Dose Route Frequency Provider Last Rate Last Dose  . 0.9 %  sodium chloride infusion  250 mL Intravenous PRN [name], MD      . 0.9 %  sodium chloride infusion  250 mL Intravenous PRN Charolette Forward, MD 10 mL/hr at 12/30/16 0400 250 mL at 12/30/16 0400  . acetaminophen (TYLENOL) tablet 650 mg  650 mg Oral Q4H PRN [name], MD   650 mg at 12/28/16 1717  . amLODipine (NORVASC) tablet 5 mg  5 mg Oral Daily [name], MD   5 mg at 01/01/17 0924  . angioplasty book   Does not apply Once Charolette Forward, MD      . aspirin EC tablet 81 mg  81 mg Oral Daily [name], MD   81 mg at 01/01/17 0924  . calcium carbonate (OS-CAL - dosed in mg of elemental calcium) tablet 500 mg of elemental calcium  1 tablet Oral Q breakfast [name], MD   500 mg of elemental calcium at 01/01/17 0924  . DULoxetine (CYMBALTA) DR capsule 20 mg  20 mg Oral Daily [name], MD   20 mg at 01/01/17 0925  . feeding supplement (ENSURE ENLIVE) (ENSURE ENLIVE) liquid 237 mL  237 mL Oral QID [name], MD   237 mL at 01/01/17 1000  . metoprolol (LOPRESSOR) injection 5 mg  5 mg Intravenous Once [name], MD      . metoprolol (LOPRESSOR) tablet 50 mg  50 mg Oral BID [name], MD    50 mg at 01/01/17 0924  . mirtazapine (REMERON) tablet 15 mg  15 mg Oral QHS [name], MD   15 mg at 12/31/16 2209  . nitroGLYCERIN 50 mg in dextrose 5 % 250 mL (0.2 mg/mL) infusion  10 mcg/min Intravenous Titrated Charolette Forward, MD 3 mL/hr at 12/30/16 0400 10 mcg/min at 12/30/16 0400  . ondansetron (ZOFRAN) injection 4 mg  4 mg Intravenous Q4H PRN [name], MD   4 mg at 12/22/16 2331  . pantoprazole (PROTONIX) EC tablet 40 mg  40 mg Oral BID AC [name], MD   40 mg at 01/01/17 0550  . polyethylene glycol (MIRALAX / GLYCOLAX) packet 17 g  17 g Oral Daily PRN [name], MD   17 g at 12/26/16 2153  . potassium chloride (K-DUR,KLOR-CON) CR tablet 10 mEq  10 mEq Oral BID [name], MD   10 mEq at 01/01/17 0924  . pravastatin (PRAVACHOL) tablet 40 mg  40 mg Oral QPM [name], MD   40 mg at 12/31/16 1836  . sodium chloride flush (NS) 0.9 % injection 3 mL  3 mL Intravenous Q12H Charolette Forward, MD   3 mL at 01/01/17 0927  . sodium chloride flush (NS) 0.9 % injection 3 mL  3 mL Intravenous PRN Charolette Forward, MD      . ticagrelor (BRILINTA) tablet 90 mg  90 mg Oral BID [name], MD   90 mg at 01/01/17 0924  . traMADol (ULTRAM) tablet 50 mg  50 mg Oral Q8H PRN [name], MD   50 mg at 12/31/16 2221     Discharge Medications: Please see discharge summary for a list of discharge medications.  Relevant Imaging Results:  Relevant Lab Results:   Additional Information [id number]  Wende Neighbors, LCSW

## 2017-01-01 NOTE — Discharge Summary (Signed)
Physician Discharge Summary  Patient ID: Abigail [name] MRN: [id number] DOB/AGE: [birth year] 71 y.o.  Admit date: 12/22/2016 Discharge date: 01/01/2017  Admission Diagnoses: Dizziness Tachy-brady syndrome CAD Hypertension Multiple myeloma GERD Abnormal weight loss CKD, II Discharge Diagnoses:  Principal Problem: *Acute coronary syndrome ([place])*   Dizziness Active Problems:   Multivessel CAD   LAD stent   Anemia of acute blood loss   Dizziness   Dehydration   Tachycardia-bradycardia (HCC)   Malnutrition of moderate degree   Multiple myeloma (HCC)   Essential hypertension   GERD   Depression  Discharged Condition: fair  Hospital Course: 71 year old female with hypertension, dyslipidemia, CAD, GERD and multiple myeloma had significant weight loss and chest pain. Her chemotherapy for her multiple myeloma was on hold. She underwent cardiac catheterization showing multivessel CAD. She was started on Aspirin and Brilinta. She underwent LAD and diagonal vessel stent placement with distal dissection of diagonal vessel. She suffered small non-Q wave MI and had anemia of acute blood loss. She needed remerin to improve oral intake.  With ongoing weakness and gait abnormality she will go to SNF and see me, Primary care and Oncologist, Dr. Benay Spice in 1 week or as arranged.  Consults: cardiology  Significant Diagnostic Studies: labs: Near normal CBC with drop in Hgb to 10.2 post stent placement. BMET was normal and slightly high creatinine normalized with hydration. Troponin-I was 14.74 and 6.00.  EKG-Sinus rhythm with antero-lateral ischemia.  Chest X-ray: Unremarkable  Cardiac catheterization: Multivessel CAD, Successful LAD stent and Diagonal stent with dissection and TIMI, Grade 1 flow distally.   Treatments: cardiac meds: metoprolol, amlodipine, aspirin, Brilinta and Pravastatin.  Discharge Exam: Blood pressure (!) 109/57, pulse 80, temperature 98.5 F (36.9 C), temperature source  Oral, resp. rate 18, height 5' 5" (1.651 m), weight 57.4 kg (126 lb 8 oz), SpO2 99 %. General appearance: alert, cooperative and appears stated age Head: Normocephalic, atraumatic. Eyes: Brown eyes, Pale pink conjunctivae/corneas clear. PERRL, EOM's intact.  Neck: no adenopathy, no carotid bruit, no JVD, supple, symmetrical, trachea midline and thyroid not enlarged. Resp: clear to auscultation bilaterally. Cardio: regular rate and rhythm, S1, S2 normal, no murmur, click, rub or gallop GI: soft, non-tender; bowel sounds normal; no masses,  no organomegaly Extremities: No cyanosis or edema Skin: Warm and dry. No rashes or lesions Neurologic: Alert and oriented X 3, normal strength and tone. Normal coordination and slow and unsteady gait.  Disposition: 03-Skilled Nursing Home.  Discharge Instructions    AMB Referral to Cardiac Rehabilitation - Phase II    Complete by:  As directed    Diagnosis:  Coronary Stents     Allergies as of 01/01/2017      Reactions   Sulfa Antibiotics Rash      Medication List    STOP taking these medications   dexamethasone 4 MG tablet Commonly known as:  DECADRON   FLUoxetine 10 MG capsule Commonly known as:  PROZAC   traMADol 50 MG tablet Commonly known as:  ULTRAM     TAKE these medications   acyclovir 400 MG tablet Commonly known as:  ZOVIRAX TAKE 1 TABLET (400 MG TOTAL) BY MOUTH 2 (TWO) TIMES DAILY.   amLODipine 10 MG tablet Commonly known as:  NORVASC Take 10 mg by mouth every evening.   aspirin EC 81 MG tablet Take 81 mg by mouth daily.   calcium carbonate 1250 (500 Ca) MG tablet Commonly known as:  OS-CAL - dosed in mg of elemental calcium Take  1 tablet by mouth daily with breakfast.   DULoxetine 20 MG capsule Commonly known as:  CYMBALTA Take 1 capsule (20 mg total) by mouth daily.   feeding supplement (ENSURE ENLIVE) Liqd Take 237 mLs by mouth 2 (two) times daily between meals. What changed:  when to take this  additional  instructions   metoprolol 50 MG tablet Commonly known as:  LOPRESSOR Take 1 tablet (50 mg total) by mouth 2 (two) times daily. What changed:  medication strength  how much to take   midodrine 2.5 MG tablet Commonly known as:  PROAMATINE Take 1 tablet (2.5 mg total) by mouth 2 (two) times daily with a meal.   mirtazapine 15 MG tablet Commonly known as:  REMERON Take 1 tablet (15 mg total) by mouth at bedtime.   pantoprazole 40 MG tablet Commonly known as:  PROTONIX Take 1 tablet (40 mg total) by mouth 2 (two) times daily before a meal.   polyethylene glycol packet Commonly known as:  MIRALAX / GLYCOLAX Take 17 g by mouth daily as needed for moderate constipation.   potassium chloride 10 MEQ tablet Commonly known as:  K-DUR,KLOR-CON Take 1 tablet (10 mEq total) by mouth daily.   pravastatin 40 MG tablet Commonly known as:  PRAVACHOL Take 40 mg by mouth every evening.   ticagrelor 90 MG Tabs tablet Commonly known as:  BRILINTA Take 1 tablet (90 mg total) by mouth 2 (two) times daily.   vitamin C 1000 MG tablet Take 1,000 mg by mouth daily.   VITAMIN D PO Take 1 capsule by mouth daily.      Follow-up Information    Philis Fendt, MD. Schedule an appointment as soon as possible for a visit in 1 week(s).   Specialty:  Internal Medicine Contact information: [place] [address] [phone]        Birdie Riddle, MD. Schedule an appointment as soon as possible for a visit in 1 week(s).   Specialty:  Cardiology Contact information: [place] [address] [phone]        [name], MD. Schedule an appointment as soon as possible for a visit in 1 week(s).   Specialty:  Oncology Contact information: [place] [address] [phone]           Signed: Birdie Riddle 01/01/2017, 2:45 PM

## 2017-01-01 NOTE — Progress Notes (Signed)
Patient's d/c canceled due to being hypotensive. MD aware. New orders placed. Will continue to monitor

## 2017-01-01 NOTE — Progress Notes (Signed)
Pt. Discharged to [facility]  Pt. D/C'd via PTAR with Discharge information reviewed and given All personal belongings given to Pt.  Education discussed IV was d/c Tele d/c

## 2017-01-01 NOTE — Progress Notes (Signed)
CARDIAC REHAB PHASE I   Discussed MI, stent, Brilinta/ASA, and CRPII with pt. She voices understanding but has minimal teach back ability. She is able to tell me she needs Brilinta x2 qd. Will refer to [place] CRPII in hopes that she will get stronger at [facility], etc.  [place], ACSM 01/01/2017 3:19 PM

## 2017-01-01 NOTE — Telephone Encounter (Signed)
Added f/u for 3/22 per 3/15 schedule message. Per desk nurse patient will be given f/u upon discharge from hospital today. Not able to reach patient re appointment or leave message. Schedule mailed.

## 2017-01-01 NOTE — Clinical Social Work Placement (Signed)
   CLINICAL SOCIAL WORK PLACEMENT  NOTE  Date:  01/01/2017  Patient Details  Name: Abigail [name] MRN: [id number] Date of Birth: 08/28/1946  Clinical Social Work is seeking post-discharge placement for this patient at the [place] level of care (*CSW will initial, date and re-position this form in  chart as items are completed):  Yes   Patient/family provided with [place] Work Department's list of facilities offering this level of care within the geographic area requested by the patient (or if unable, by the patient's family).  Yes   Patient/family informed of their freedom to choose among providers that offer the needed level of care, that participate in Medicare, Medicaid or managed care program needed by the patient, have an available bed and are willing to accept the patient.  Yes   Patient/family informed of [facility]'s ownership interest in [facility] and [facility], as well as of the fact that they are under no obligation to receive care at these facilities.  PASRR submitted to EDS on       PASRR number received on       Existing PASRR number confirmed on       FL2 transmitted to all facilities in geographic area requested by pt/family on       FL2 transmitted to all facilities within larger geographic area on       Patient informed that his/her managed care company has contracts with or will negotiate with certain facilities, including the following:            Patient/family informed of bed offers received.  Patient chooses bed at       Physician recommends and patient chooses bed at      Patient to be transferred to   on  .  Patient to be transferred to facility by       Patient family notified on   of transfer.  Name of family member notified:        PHYSICIAN Please sign FL2     Additional Comment:    _______________________________________________ Wende Neighbors, LCSW 01/01/2017, 10:54 AM

## 2017-01-01 NOTE — Progress Notes (Addendum)
Attempted report x2 to [facility] Report given 6:46 PM

## 2017-01-01 NOTE — Clinical Social Work Note (Signed)
Clinical Social Work Assessment  Patient Details  Name: Abigail [name] MRN: [id number] Date of Birth: 1946/02/11  Date of referral:  01/01/17               Reason for consult:  Discharge Planning                Permission sought to share information with:  Family Supports Permission granted to share information::  Yes, Verbal Permission Granted  Name::     Abigail [name]  Agency::     Relationship::  sister  Contact Information:  [phone]  Housing/Transportation Living arrangements for the past 2 months:  [place] of Information:  Patient Patient Interpreter Needed:  None Criminal Activity/Legal Involvement Pertinent to Current Situation/Hospitalization:  No - Comment as needed Significant Relationships:  Other Family Members Lives with:  Self Do you feel safe going back to the place where you live?  Yes Need for family participation in patient care:  Yes (Comment)  Care giving concerns: Patient lives at home by herself. Patient stated that she does have support from her sisters who live close by  Social Worker assessment / plan: Clinical Social Worker met patient at bedside to offer support and discuss patients needs at discharge. Patient stated she does not want to go to a SNF far away and would like to stay close by. Patient stated she will contact sisters and talk to them about going to SNF after discharge. Patient stated she has gone to SNF in the past. CSW to fax out necessary paperwork to surrounding SNF in [place]. CSW to follow up once beds offers are available.   Employment status:  Retired Forensic scientist:  Medicare PT Recommendations:  [place] / Referral to community resources:  [place]  Patient/Family's Response to care:  Patient verbalized appreciation and understanding for CSW role and involvemnt in care. Patient agreeable with current discharge plan to SNF  Patient/Family's  Understanding of and Emotional Response to Diagnosis, Current Treatment, and Prognosis: Patient with good understanding of current medical state and limitations around most recent hospitalization.  Emotional Assessment Appearance:  Appears stated age Attitude/Demeanor/Rapport:  Other Affect (typically observed):  Appropriate Orientation:  Oriented to Place, Oriented to  Time, Oriented to Self, Oriented to Situation Alcohol / Substance use:  Not Applicable Psych involvement (Current and /or in the community):  No (Comment)  Discharge Needs  Concerns to be addressed:  No discharge needs identified Readmission within the last 30 days:  No Current discharge risk:  None Barriers to Discharge:  No Barriers Identified   Abigail Neighbors, LCSW 01/01/2017, 10:49 AM

## 2017-01-01 NOTE — Care Management Note (Signed)
Case Management Note Previous CM note initiated by Zenon Mayo, RN--12/29/2016, [place] PM    Patient Details  Name: Abigail [name] MRN: [id number] Date of Birth: 07/01/1946  Subjective/Objective:    S/p coronary stent intervention, will be on brilinta, patient has 30 day savings card from this weekend, but did not see a benefit check,  NCM awaiting benefit check for brilinta. Also patient is active with [facility] for [place].  Per Cardiac rehab, patient unsafe to ambulate by herself and she lives alone, await pt/ot eval.    NCM will cont to follow for dc needs.   Per rep at [facility] is covered, no auth required. Co-pay is $47 at retail for 30 day supply               Action/Plan:   Expected Discharge Date:  01/01/17               Expected Discharge Plan:  [place] Referral:     Discharge planning Services  CM Consult  Post Acute Care Choice:  [place], Resumption of Svcs/PTA Provider Choice offered to:     DME Arranged:    DME Agency:     HH Arranged:    [place] Agency:  [facility]  Status of Service:  Completed, signed off  If discussed at [place] of Stay Meetings, dates discussed:    Discharge Disposition: skilled facility   Additional Comments:  01/01/17- 1500- Marvetta Gibbons RN, CM- pt has chosen to go to a SNF - CSW following for placement needs- have notified Santiago Glad with [facility] of SNF discharge.   Dahlia Client Leggett, RN 01/01/2017, 3:18 PM [phone]

## 2017-01-01 NOTE — Progress Notes (Signed)
Clinical Social Worker facilitated patient discharge including contacting patient family and facility to confirm patient discharge plans.  Clinical information faxed to facility and family agreeable with plan.  CSW arranged ambulance transport via [facility] to [facility].  RN Katrina to call [phone] and ask for the RN that has room 123B for report prior to discharge.  Clinical Social Worker will sign off for now as social work intervention is no longer needed. Please consult [place] again if new need arises.  Rhea Pink, MSW, [place]

## 2017-01-02 MED ORDER — METOPROLOL TARTRATE 25 MG PO TABS: 12.5000 mg | ORAL_TABLET | Freq: Two times a day (BID) | ORAL | 3 refills | Status: DC

## 2017-01-02 NOTE — Progress Notes (Signed)
Subjective:  Doing well.  Denies any chest pain or shortness of breath.  Denies any dizziness.  Discharge to skilled nursing facility was held yesterday due to hypotension.  Objective:  Vital Signs in the last 24 hours: Temp:  [98.5 F (36.9 C)] 98.5 F (36.9 C) (03/15 1414) Pulse Rate:  [61-80] 80 (03/15 1414) Resp:  [18] 18 (03/15 1414) BP: (78-123)/(56-83) 78/56 (03/15 2230) SpO2:  [96 %-100 %] 96 % (03/16 0400) Weight:  [138 lb 0.1 oz (62.6 kg)] 138 lb 0.1 oz (62.6 kg) (03/16 0400)  Intake/Output from previous day: 03/15 0701 - 03/16 0700 In: 987.7 [P.O.:120; I.V.:867.7] Out: 300 [Urine:300] Intake/Output from this shift: No intake/output data recorded.  Physical Exam: Neck: no adenopathy, no carotid bruit, no JVD and supple, symmetrical, trachea midline Lungs: clear to auscultation bilaterally Heart: regular rate and rhythm, S1, S2 normal and soft systolic murmur noted Abdomen: regular rate and rhythm, S1, S2 normal and soft systolic murmur noted Extremities: extremities normal, atraumatic, no cyanosis or edema  Lab Results:  Recent Labs  12/31/16 0250  WBC 7.3  HGB 10.2*  PLT 194    Recent Labs  12/31/16 0250  NA 141  K 4.1  CL 112*  CO2 21*  GLUCOSE 104*  BUN 13  CREATININE 0.81    Recent Labs  12/31/16 0250  TROPONINI 6.00*   Hepatic Function Panel No results for input(s): PROT, ALBUMIN, AST, ALT, ALKPHOS, BILITOT, BILIDIR, IBILI in the last 72 hours. No results for input(s): CHOL in the last 72 hours. No results for input(s): PROTIME in the last 72 hours.  Imaging: Imaging results have been reviewed and No results found.  Cardiac Studies:  Assessment/Plan:  Acute coronary syndrome Multivessel CAD LAD stent Diagonal stent with distal dissection Anemia of acute blood loss Multiple myeloma GERD Abnormal weight loss Depression Dehydration Hypotension secondary to meds Plan DC amlodipine from discharge medications and reduce  metoprolol succinate to 25 mg daily starting from tomorrow. Continue IV fluid 100 cc/h Okay to discharge to skilled nursing facility later this afternoon.  if blood pressure stays above 948 systolic  LOS: 9 days    Charolette Forward 01/02/2017, 8:45 AM

## 2017-01-02 NOTE — Progress Notes (Signed)
  Clinical Social Worker facilitated patient discharge including contacting patient family and facility to confirm patient discharge plans.  Clinical information faxed to facility and family agreeable with plan.  CSW arranged ambulance transport via [facility] to [facility] .  RN Katharine Look to call [phone] for report prior to discharge.  Clinical Social Worker will sign off for now as social work intervention is no longer needed. Please consult [place] again if new need arises.  Rhea Pink, MSW, [place]

## 2017-01-02 NOTE — Discharge Summary (Signed)
Patient ID: Abigail [name] MRN: [id number] DOB/AGE: 03/05/1946 71 y.o.  Admit date: 12/22/2016 Discharge date: 01/02/2017  Admission Diagnoses:  Discharge Diagnoses:  Principal Problem:   Dizziness Active Problems:   Malnutrition of moderate degree   Multiple myeloma (HCC)   Tachycardia-bradycardia ([place])   Essential hypertension   Acute coronary syndrome [facility])   Discharged Condition: good  Hospital Course: as per Dr. Doylene Canard discharge summary  Consults: None  Significant Diagnostic Studies: as per prior discharge summary  Treatments: as per prior discharge summary  Discharge Exam: Blood pressure 100/63, pulse 74, temperature 98.5 F (36.9 C), temperature source Oral, resp. rate 18, height '5\' 5"'  (1.651 m), weight 138 lb 0.1 oz (62.6 kg), SpO2 100 %no changesno changes  Disposition: 01-Home or Self Care  Discharge Instructions    AMB Referral to Cardiac Rehabilitation - Phase II    Complete by:  As directed    Diagnosis:  Coronary Stents   Amb Referral to Cardiac Rehabilitation    Complete by:  As directed    Diagnosis:   Coronary Stents NSTEMI PTCA       Allergies as of 01/02/2017      Reactions   Sulfa Antibiotics Rash      Medication List    STOP taking these medications   amLODipine 10 MG tablet Commonly known as:  NORVASC   dexamethasone 4 MG tablet Commonly known as:  DECADRON   FLUoxetine 10 MG capsule Commonly known as:  PROZAC   traMADol 50 MG tablet Commonly known as:  ULTRAM     TAKE these medications   acyclovir 400 MG tablet Commonly known as:  ZOVIRAX TAKE 1 TABLET (400 MG TOTAL) BY MOUTH 2 (TWO) TIMES DAILY.   aspirin EC 81 MG tablet Take 81 mg by mouth daily.   calcium carbonate 1250 (500 Ca) MG tablet Commonly known as:  OS-CAL - dosed in mg of elemental calcium Take 1 tablet by mouth daily with breakfast.   DULoxetine 20 MG capsule Commonly known as:  CYMBALTA Take 1 capsule (20 mg total) by mouth daily.   feeding  supplement (ENSURE ENLIVE) Liqd Take 237 mLs by mouth 2 (two) times daily between meals. What changed:  when to take this  additional instructions   metoprolol tartrate 25 MG tablet Commonly known as:  LOPRESSOR Take 0.5 tablets (12.5 mg total) by mouth 2 (two) times daily. What changed:  how much to take   midodrine 2.5 MG tablet Commonly known as:  PROAMATINE Take 1 tablet (2.5 mg total) by mouth 2 (two) times daily with a meal.   mirtazapine 15 MG tablet Commonly known as:  REMERON Take 1 tablet (15 mg total) by mouth at bedtime.   pantoprazole 40 MG tablet Commonly known as:  PROTONIX Take 1 tablet (40 mg total) by mouth 2 (two) times daily before a meal.   polyethylene glycol packet Commonly known as:  MIRALAX / GLYCOLAX Take 17 g by mouth daily as needed for moderate constipation.   potassium chloride 10 MEQ tablet Commonly known as:  K-DUR,KLOR-CON Take 1 tablet (10 mEq total) by mouth daily.   pravastatin 40 MG tablet Commonly known as:  PRAVACHOL Take 40 mg by mouth every evening.   ticagrelor 90 MG Tabs tablet Commonly known as:  BRILINTA Take 1 tablet (90 mg total) by mouth 2 (two) times daily.   vitamin C 1000 MG tablet Take 1,000 mg by mouth daily.   VITAMIN D PO Take 1 capsule by mouth daily.  Contact information for follow-up providers    Philis Fendt, MD. Schedule an appointment as soon as possible for a visit in 1 week(s).   Specialty:  Internal Medicine Contact information: [place] [address] [phone]        Birdie Riddle, MD. Schedule an appointment as soon as possible for a visit in 1 week(s).   Specialty:  Cardiology Contact information: [place] [address] [phone]        [name], MD. Schedule an appointment as soon as possible for a visit in 1 week(s).   Specialty:  Oncology Contact information: [place]  [address] [phone]            Contact information for after-discharge care    [facility] .   Specialty:  [place] information: [address] [place] [phone]                  Signed: Charolette Forward 01/02/2017, 2:04 PM

## 2017-01-02 NOTE — Progress Notes (Signed)
Physical Therapy Treatment Patient Details Name: Abigail [name] MRN: [id number] DOB: 1945-11-26 Today's Date: 01/02/2017    History of Present Illness 71 year old female with PMH of anemia, CAD, GERD, Hyperlipidemia, hypertension and Multiple myeloma has lost several pounds of weight since started chemotherapy with Velcade injections and Revlimid oral therapy. She presented to office with palpitations and dizziness off and on. EKG showed paroxysmal atrial tachycardia. She is placed in hospital for further work up and monitoring.    PT Comments    Noted DC held yesterday due to hypotension. BP now 100/63 in supine. Pt ambulated 57' with RW with min to min/guard assist, she denied dizziness with position changes.   Follow Up Recommendations  SNF;Supervision for mobility/OOB     Equipment Recommendations  None recommended by PT    Recommendations for Other Services       Precautions / Restrictions Precautions Precautions: Fall Precaution Comments: orthostatics Restrictions Weight Bearing Restrictions: No    Mobility  Bed Mobility Overal bed mobility: Modified Independent Bed Mobility: Supine to Sit     Supine to sit: Modified independent (Device/Increase time)     General bed mobility comments:  use of bed rail to get EOB  Transfers Overall transfer level: Needs assistance Equipment used: None Transfers: Sit to/from Stand Sit to Stand: Supervision         General transfer comment: supervision for safety; no LOB or dizziness upon standing  Ambulation/Gait Ambulation/Gait assistance: Min guard;Min assist Ambulation Distance (Feet): 80 Feet Assistive device: Rolling walker (2 wheeled) Gait Pattern/deviations: Step-through pattern;Decreased step length - right;Decreased step length - left;Decreased stride length Gait velocity: decreased   General Gait Details: steady with RW, min A to maneuver RW with turns, decr velocity   Stairs            Wheelchair  Mobility    Modified Rankin (Stroke Patients Only)       Balance Overall balance assessment: Needs assistance Sitting-balance support: Feet supported;No upper extremity supported Sitting balance-Leahy Scale: Good     Standing balance support: No upper extremity supported;During functional activity Standing balance-Leahy Scale: Fair                      Cognition Arousal/Alertness: Awake/alert Behavior During Therapy: WFL for tasks assessed/performed Overall Cognitive Status: Within Functional Limits for tasks assessed                      Exercises      General Comments General comments (skin integrity, edema, etc.): BP supine 100/63, no dizziness with position changes      Pertinent Vitals/Pain Pain Assessment: No/denies pain    Home Living                      Prior Function            PT Goals (current goals can now be found in the care plan section) Acute Rehab PT Goals Patient Stated Goal: to get back home, likes to read murder mysteries PT Goal Formulation: With patient Time For Goal Achievement: 01/14/17 Potential to Achieve Goals: Good Progress towards PT goals: Progressing toward goals    Frequency    Min 3X/week      PT Plan Current plan remains appropriate    Co-evaluation             End of Session Equipment Utilized During Treatment: Gait belt Activity Tolerance: Patient tolerated treatment well Patient left:  in chair;with call bell/phone within reach;with nursing/sitter in room Nurse Communication: Mobility status PT Visit Diagnosis: Unsteadiness on feet (R26.81);Muscle weakness (generalized) (M62.81)     Time: 9924-2683 PT Time Calculation (min) (ACUTE ONLY): 23 min  Charges:  $Gait Training: 8-22 mins $Therapeutic Activity: 8-22 mins                    G Codes:       Philomena Doheny 01/02/2017, 10:11 AM [phone]

## 2017-01-05 ENCOUNTER — Telehealth (HOSPITAL_COMMUNITY): Payer: Self-pay | Admitting: Internal Medicine

## 2017-01-05 NOTE — Telephone Encounter (Signed)
Verified [employer] insurance benefits through [employer]. Copay $10.00, coinsurance 0%, no Deductible Out of Pocket $5900.00, pt has met $1821.62, pt's responsibility [phone]. Reference [phone]... KJ

## 2017-01-08 ENCOUNTER — Telehealth: Payer: Self-pay | Admitting: Nurse Practitioner

## 2017-01-08 ENCOUNTER — Ambulatory Visit (HOSPITAL_BASED_OUTPATIENT_CLINIC_OR_DEPARTMENT_OTHER): Payer: Medicare HMO | Admitting: Nurse Practitioner

## 2017-01-08 VITALS — BP 106/77 | HR 77 | Temp 98.0°F | Resp 16 | Ht 65.0 in | Wt 132.9 lb

## 2017-01-08 DIAGNOSIS — C9000 Multiple myeloma not having achieved remission: Secondary | ICD-10-CM | POA: Diagnosis not present

## 2017-01-08 DIAGNOSIS — D630 Anemia in neoplastic disease: Secondary | ICD-10-CM | POA: Diagnosis not present

## 2017-01-08 NOTE — Telephone Encounter (Signed)
Gave patient AVS and calender per 3/22 los 

## 2017-01-08 NOTE — Progress Notes (Signed)
[place] OFFICE PROGRESS NOTE   Diagnosis:  Multiple myeloma  INTERVAL HISTORY:   Abigail [name] returns for follow-up. Myeloma treatment has been on hold since mid-January due to failure to thrive. She was hospitalized 12/22/2016 presenting with dizziness. Cardiac catheterization showed multivessel CAD. She underwent LAD and diagonal vessel stent placement with distal dissection of diagonal vessel. She was discharged to a skilled nursing facility 01/02/2017.  She is feeling some better but appetite and energy level remain poor. She is participating in physical therapy twice a day. She has occasional dizziness. No nausea, vomiting or diarrhea.  Objective:  Vital signs in last 24 hours:  Blood pressure 106/77, pulse 77, temperature 98 F (36.7 C), temperature source Oral, resp. rate 16, height '5\' 5"'  (1.651 m), weight 132 lb 14.4 oz (60.3 kg), SpO2 100 %.    HEENT: Mild white coating over tongue. No buccal thrush. Resp: Lungs clear bilaterally. Cardio: Regular rate and rhythm. GI: Abdomen soft and nontender. No organomegaly. Vascular: No leg edema. Skin: Ecchymoses left lower arm extending to the dorsum of the hand.    Lab Results:  Lab Results  Component Value Date   WBC 7.3 12/31/2016   HGB 10.2 (L) 12/31/2016   HCT 31.5 (L) 12/31/2016   MCV 90.0 12/31/2016   PLT 194 12/31/2016   NEUTROABS 4.7 12/22/2016    Imaging:  No results found.  Medications: I have reviewed the patient's current medications.  Assessment/Plan: 1. Multiple myeloma, IgG lambda, bone marrow biopsy 06/15/2016 confirmed multiple myeloma; cytogenetics by FISHshow +11, +12, 13q-  Elevated serum free lambda light chains  Lambda light chain proteinuria  Lytic bone lesions on a bone survey 06/13/2016  Initiation of weekly Velcade/Decadron 06/19/2016  Serum light chains improved 07/31/2016  Initiation of Revlimid 11/03/20172 weeks on/2 weeks off  Serum M spike and IgG  significantly improved 09/25/2016  Velcade held on 10/30/2016 due to urinary retention  2. Severe anemia secondary to #1-markedly improved  3. Diffuse lytic bone lesions secondary to multiple myeloma, status post Zometa 09/18/2016(plan to continue every 3 months)  4. History of coronary artery disease/myocardial infarction  5. History of colon polyps  6. Bilateral leg edema 09/04/2016. Negative bilateral venous Doppler 09/05/2016.  7. Mild periorbital edema 09/18/2016, question related to early stye formation, question Velcade related chalazia.Improved.  8. CT scan 10/27/2016 with a new right kidney mass evaluated by urology; MRI abdomen 11/18/2016 multiple bilateral renal cysts. Most of these are simple cysts. The 2 lesions in question on the CT scan are both hemorrhagic or proteinaceous cysts. No worrisome contrast enhancement.  9. Urinary retention 10/28/2016 status post evaluation in the emergency department. Urinalysis negative for signs of infection 10/30/2016. Velcade held. Resolved.  10. Abdominal pain, nausea, vomiting.Etiology unclear. Upper endoscopy 11/18/2016 with findings of reflux esophagitis, benign appearing esophageal stenosis, small hiatal hernia and erythematous duodenopathy. CT abdomen/pelvis 11/19/2016 with no acute abnormality.  11. Multivessel CAD status post LAD and diagonal vessel stent placement    Disposition: Ms. Kruczek was diagnosed with multiple myeloma August 2017. She began treatment with Velcade/dexamethasone, Revlimid added November 2017. Myeloma labs have shown significant improvement. Treatment has been on hold since January of this year, most recently due to failure to thrive.  She overall is feeling better but continues to have malaise and anorexia. We will continue to hold treatment. She will return for a follow-up visit and repeat myeloma labs in 3 weeks. She will work on activity and nutrition during that time.  Plan reviewed  with Dr.  Benay Spice.    [name] ANP/GNP-BC   01/08/2017  2:35 PM

## 2017-01-21 ENCOUNTER — Observation Stay (HOSPITAL_COMMUNITY): Payer: Medicare HMO

## 2017-01-21 ENCOUNTER — Inpatient Hospital Stay (HOSPITAL_COMMUNITY)
Admission: AD | Admit: 2017-01-21 | Discharge: 2017-01-25 | DRG: 287 | Disposition: A | Discharge status: Home Health | Payer: Medicare HMO | Source: Ambulatory Visit | Attending: Cardiovascular Disease | Admitting: Cardiovascular Disease

## 2017-01-21 DIAGNOSIS — I471 Supraventricular tachycardia: Secondary | ICD-10-CM | POA: Diagnosis present

## 2017-01-21 DIAGNOSIS — E440 Moderate protein-calorie malnutrition: Secondary | ICD-10-CM | POA: Diagnosis present

## 2017-01-21 DIAGNOSIS — I2584 Coronary atherosclerosis due to calcified coronary lesion: Secondary | ICD-10-CM | POA: Diagnosis present

## 2017-01-21 DIAGNOSIS — I252 Old myocardial infarction: Secondary | ICD-10-CM

## 2017-01-21 DIAGNOSIS — Z7902 Long term (current) use of antithrombotics/antiplatelets: Secondary | ICD-10-CM

## 2017-01-21 DIAGNOSIS — R079 Chest pain, unspecified: Secondary | ICD-10-CM | POA: Diagnosis present

## 2017-01-21 DIAGNOSIS — I129 Hypertensive chronic kidney disease with stage 1 through stage 4 chronic kidney disease, or unspecified chronic kidney disease: Secondary | ICD-10-CM | POA: Diagnosis present

## 2017-01-21 DIAGNOSIS — Z79899 Other long term (current) drug therapy: Secondary | ICD-10-CM

## 2017-01-21 DIAGNOSIS — R109 Unspecified abdominal pain: Secondary | ICD-10-CM | POA: Diagnosis present

## 2017-01-21 DIAGNOSIS — I249 Acute ischemic heart disease, unspecified: Secondary | ICD-10-CM | POA: Diagnosis present

## 2017-01-21 DIAGNOSIS — I495 Sick sinus syndrome: Secondary | ICD-10-CM | POA: Diagnosis present

## 2017-01-21 DIAGNOSIS — R112 Nausea with vomiting, unspecified: Secondary | ICD-10-CM | POA: Diagnosis present

## 2017-01-21 DIAGNOSIS — F329 Major depressive disorder, single episode, unspecified: Secondary | ICD-10-CM | POA: Diagnosis present

## 2017-01-21 DIAGNOSIS — K219 Gastro-esophageal reflux disease without esophagitis: Secondary | ICD-10-CM | POA: Diagnosis present

## 2017-01-21 DIAGNOSIS — Z6822 Body mass index (BMI) 22.0-22.9, adult: Secondary | ICD-10-CM

## 2017-01-21 DIAGNOSIS — R0602 Shortness of breath: Secondary | ICD-10-CM

## 2017-01-21 DIAGNOSIS — E860 Dehydration: Secondary | ICD-10-CM | POA: Diagnosis present

## 2017-01-21 DIAGNOSIS — E785 Hyperlipidemia, unspecified: Secondary | ICD-10-CM | POA: Diagnosis present

## 2017-01-21 DIAGNOSIS — R627 Adult failure to thrive: Secondary | ICD-10-CM | POA: Diagnosis present

## 2017-01-21 DIAGNOSIS — F17210 Nicotine dependence, cigarettes, uncomplicated: Secondary | ICD-10-CM | POA: Diagnosis present

## 2017-01-21 DIAGNOSIS — D500 Iron deficiency anemia secondary to blood loss (chronic): Secondary | ICD-10-CM | POA: Diagnosis present

## 2017-01-21 DIAGNOSIS — C9000 Multiple myeloma not having achieved remission: Secondary | ICD-10-CM | POA: Diagnosis present

## 2017-01-21 DIAGNOSIS — I25110 Atherosclerotic heart disease of native coronary artery with unstable angina pectoris: Principal | ICD-10-CM | POA: Diagnosis present

## 2017-01-21 DIAGNOSIS — Z7982 Long term (current) use of aspirin: Secondary | ICD-10-CM

## 2017-01-21 DIAGNOSIS — R531 Weakness: Secondary | ICD-10-CM

## 2017-01-21 DIAGNOSIS — N183 Chronic kidney disease, stage 3 (moderate): Secondary | ICD-10-CM | POA: Diagnosis present

## 2017-01-21 DIAGNOSIS — Z955 Presence of coronary angioplasty implant and graft: Secondary | ICD-10-CM

## 2017-01-21 DIAGNOSIS — I951 Orthostatic hypotension: Secondary | ICD-10-CM | POA: Diagnosis present

## 2017-01-21 LAB — COMPREHENSIVE METABOLIC PANEL
ALT: 24 U/L (ref 14–54)
AST: 39 U/L (ref 15–41)
Albumin: 4.1 g/dL (ref 3.5–5.0)
Alkaline Phosphatase: 50 U/L (ref 38–126)
Anion gap: 13 (ref 5–15)
BILIRUBIN TOTAL: 1 mg/dL (ref 0.3–1.2)
BUN: 39 mg/dL — AB (ref 6–20)
CHLORIDE: 101 mmol/L (ref 101–111)
CO2: 21 mmol/L — ABNORMAL LOW (ref 22–32)
CREATININE: 2.37 mg/dL — AB (ref 0.44–1.00)
Calcium: 9.9 mg/dL (ref 8.9–10.3)
GFR, EST AFRICAN AMERICAN: 23 mL/min — AB (ref >60)
GFR, EST NON AFRICAN AMERICAN: 20 mL/min — AB (ref >60)
Glucose, Bld: 101 mg/dL — ABNORMAL HIGH (ref 65–99)
POTASSIUM: 4.8 mmol/L (ref 3.5–5.1)
Sodium: 135 mmol/L (ref 135–145)
TOTAL PROTEIN: 6.4 g/dL — AB (ref 6.5–8.1)

## 2017-01-21 LAB — CBC WITH DIFFERENTIAL/PLATELET
BASOS ABS: 0 10*3/uL (ref 0.0–0.1)
Basophils Relative: 1 %
EOS ABS: 0.1 10*3/uL (ref 0.0–0.7)
EOS PCT: 3 %
HCT: 35.6 % — ABNORMAL LOW (ref 36.0–46.0)
HEMOGLOBIN: 11.4 g/dL — AB (ref 12.0–15.0)
LYMPHS ABS: 1.3 10*3/uL (ref 0.7–4.0)
Lymphocytes Relative: 25 %
MCH: 29.8 pg (ref 26.0–34.0)
MCHC: 32 g/dL (ref 30.0–36.0)
MCV: 93 fL (ref 78.0–100.0)
Monocytes Absolute: 0.9 10*3/uL (ref 0.1–1.0)
Monocytes Relative: 18 %
NEUTROS PCT: 53 %
Neutro Abs: 2.8 10*3/uL (ref 1.7–7.7)
Platelets: 203 10*3/uL (ref 150–400)
RBC: 3.83 MIL/uL — AB (ref 3.87–5.11)
RDW: 17.1 % — ABNORMAL HIGH (ref 11.5–15.5)
WBC: 5.2 10*3/uL (ref 4.0–10.5)

## 2017-01-21 LAB — TROPONIN I: TROPONIN I: 0.08 ng/mL — AB (ref <0.03)

## 2017-01-21 MED ORDER — TICAGRELOR 90 MG PO TABS
90.0000 mg | ORAL_TABLET | Freq: Two times a day (BID) | ORAL | Status: DC
  Administered 2017-01-21 – 2017-01-25 (×8): 90 mg via ORAL
  Filled 2017-01-21 (×8): qty 1

## 2017-01-21 MED ORDER — DULOXETINE HCL 20 MG PO CPEP
20.0000 mg | ORAL_CAPSULE | Freq: Every day | ORAL | Status: DC
  Administered 2017-01-22 – 2017-01-25 (×4): 20 mg via ORAL
  Filled 2017-01-21 (×5): qty 1

## 2017-01-21 MED ORDER — ONDANSETRON HCL 4 MG/2ML IJ SOLN
4.0000 mg | Freq: Four times a day (QID) | INTRAMUSCULAR | Status: DC | PRN
  Administered 2017-01-21: 4 mg via INTRAVENOUS
  Filled 2017-01-21: qty 2

## 2017-01-21 MED ORDER — ASPIRIN EC 81 MG PO TBEC
81.0000 mg | DELAYED_RELEASE_TABLET | Freq: Every day | ORAL | Status: DC
  Administered 2017-01-22 – 2017-01-25 (×4): 81 mg via ORAL
  Filled 2017-01-21 (×4): qty 1

## 2017-01-21 MED ORDER — HEPARIN BOLUS VIA INFUSION
4000.0000 [IU] | Freq: Once | INTRAVENOUS | Status: AC
  Administered 2017-01-21: 4000 [IU] via INTRAVENOUS
  Filled 2017-01-21: qty 4000

## 2017-01-21 MED ORDER — PRAVASTATIN SODIUM 40 MG PO TABS
40.0000 mg | ORAL_TABLET | Freq: Every evening | ORAL | Status: DC
  Administered 2017-01-21 – 2017-01-24 (×4): 40 mg via ORAL
  Filled 2017-01-21 (×4): qty 1

## 2017-01-21 MED ORDER — POTASSIUM CHLORIDE CRYS ER 10 MEQ PO TBCR
10.0000 meq | EXTENDED_RELEASE_TABLET | Freq: Every day | ORAL | Status: DC
  Administered 2017-01-22 – 2017-01-25 (×4): 10 meq via ORAL
  Filled 2017-01-21 (×4): qty 1

## 2017-01-21 MED ORDER — PANTOPRAZOLE SODIUM 40 MG PO TBEC
40.0000 mg | DELAYED_RELEASE_TABLET | Freq: Every day | ORAL | Status: DC
  Administered 2017-01-21 – 2017-01-25 (×5): 40 mg via ORAL
  Filled 2017-01-21 (×5): qty 1

## 2017-01-21 MED ORDER — VITAMIN C 500 MG PO TABS
1000.0000 mg | ORAL_TABLET | Freq: Every day | ORAL | Status: DC
  Administered 2017-01-22 – 2017-01-25 (×4): 1000 mg via ORAL
  Filled 2017-01-21 (×4): qty 2

## 2017-01-21 MED ORDER — HEPARIN SODIUM (PORCINE) 5000 UNIT/ML IJ SOLN: 5000.0000 [IU] | Freq: Three times a day (TID) | INTRAMUSCULAR | Status: DC

## 2017-01-21 MED ORDER — HEPARIN (PORCINE) IN NACL 100-0.45 UNIT/ML-% IJ SOLN
750.0000 [IU]/h | INTRAMUSCULAR | Status: DC
  Administered 2017-01-21: 750 [IU]/h via INTRAVENOUS
  Filled 2017-01-21: qty 250

## 2017-01-21 MED ORDER — VITAMIN D 1000 UNITS PO TABS
1000.0000 [IU] | ORAL_TABLET | Freq: Every day | ORAL | Status: DC
  Administered 2017-01-22 – 2017-01-25 (×4): 1000 [IU] via ORAL
  Filled 2017-01-21 (×4): qty 1

## 2017-01-21 MED ORDER — ENSURE ENLIVE PO LIQD
237.0000 mL | Freq: Two times a day (BID) | ORAL | Status: DC
  Administered 2017-01-22: 237 mL via ORAL

## 2017-01-21 MED ORDER — SODIUM CHLORIDE 0.9 % IV SOLN
INTRAVENOUS | Status: DC
  Administered 2017-01-21 – 2017-01-22 (×3): via INTRAVENOUS

## 2017-01-21 MED ORDER — MIRTAZAPINE 7.5 MG PO TABS
15.0000 mg | ORAL_TABLET | Freq: Every day | ORAL | Status: DC
  Administered 2017-01-21 – 2017-01-24 (×4): 15 mg via ORAL
  Filled 2017-01-21 (×2): qty 2
  Filled 2017-01-21: qty 1
  Filled 2017-01-21: qty 2

## 2017-01-21 MED ORDER — CALCIUM CARBONATE 1250 (500 CA) MG PO TABS
1.0000 | ORAL_TABLET | Freq: Every day | ORAL | Status: DC
  Administered 2017-01-22 – 2017-01-25 (×4): 500 mg via ORAL
  Filled 2017-01-21 (×4): qty 1

## 2017-01-21 NOTE — Progress Notes (Signed)
CRITICAL VALUE ALERT  Critical value received: troponin I  Date of notification: 01/22/17  Time of notification: 2021  Critical value read back:Yes.    Nurse who received alert:  Jola Babinski MD notified (1st page): Doylene Canard  Time of first page: 2022  MD notified (2nd page):[place] Time of second page:  Responding [id number]  Time MD responded:  2030

## 2017-01-21 NOTE — Progress Notes (Signed)
Paged Dr. Doylene Canard to let him know that there are no beds available on 3W. Patient was assigned to 2W. This unit said they could not accept the patient because she is having active chest pain rated 3-4.  Need order for stepdown bed.

## 2017-01-21 NOTE — Progress Notes (Signed)
Paged [facility] to give report. Nurse to call back.

## 2017-01-21 NOTE — Progress Notes (Signed)
ANTICOAGULATION CONSULT NOTE - Initial Consult  Pharmacy Consult for Heparin Indication: chest pain/ACS/STEMI  Allergies  Allergen Reactions  . Sulfa Antibiotics Rash    Patient Measurements: Height: '5\' 5"'  (165.1 cm) Weight: 132 lb 15 oz (60.3 kg) IBW/kg (Calculated) : 57 Heparin Dosing Weight: 60.3 kg  Vital Signs: Temp: 99 F (37.2 C) (04/04 2059) Temp Source: Oral (04/04 2059) BP: 108/70 (04/04 2059) Pulse Rate: 69 (04/04 2059)  Labs:  Recent Labs  01/21/17 1849  HGB 11.4*  HCT 35.6*  PLT 203  CREATININE 2.37*  TROPONINI 0.08*    Estimated Creatinine Clearance: 19.9 mL/min (A) (by C-G formula based on SCr of 2.37 mg/dL (H)).   Medical History: Past Medical History:  Diagnosis Date  . Anemia    "off and on all my life" (12/22/2016)  . Anxiety   . Bilateral renal artery stenosis ([place]) 1999   s/p stenting  . Chronic kidney disease (CKD), stage III (moderate)    Archie Endo 12/22/2016  . Coronary artery disease 1999  . Depression   . Diverticulosis   . Duodenitis   . Gastric erosions   . Gastric ulcer   . GERD (gastroesophageal reflux disease)   . Heart murmur   . History of blood transfusion ~ 03/2016   "low HgB; practically nonexistent"  . Hyperlipidemia   . Hypertension   . MI (myocardial infarction) 1999  . Multiple myeloma ([place]) dx'd ~ 04/2016  . PAT (paroxysmal atrial tachycardia) ([place]) 12/22/2016   Archie Endo 12/22/2016  . Retinal hemorrhage, right eye   . Schatzki's ring   . Tachy-brady syndrome ([place])    Archie Endo 12/22/2016  . Tubular adenoma of colon    Assessment:  71 yr old female with hx LAD stent 12/29/16 to begin IV heparin for ACS/STEMI.  Sq heparin initially ordered, non given yet.  Goal of Therapy:  Heparin level 0.3-0.7 units/ml Monitor platelets by anticoagulation protocol: Yes   Plan:   Heparin 4000 units IV x 1  Heparin drip to begin at 750 units/hr.  Heparin level ~8 hrs after drip begins.  Daily heparin level and CBC.  Arty Baumgartner, [place] Pager: [phone] 01/21/2017,9:07 PM

## 2017-01-21 NOTE — Progress Notes (Signed)
Report given to [facility] on [employer]. Patient transferred to 4N05 by Bertha Stakes RN.  Offered to call patient's family and notify them of patient's transfer, but patient declined offer. She said she would call her family in the am.

## 2017-01-21 NOTE — H&P (Signed)
Referring Physician:  [name] is an 71 y.o. female.                       Chief Complaint: Shortness of breath, weakness, dizziness and chest pain.  HPI: 71 years old female with PMH of multiple myeloma, hyperlipidemia, hypertension, CAD and anemia has weakness, dizziness, decreased appetite. She also has non-specific chest pain. She has lost additional 2-3 pounds of weight in 1 month. She claims to drink 3 bottles of Ensure per day but smell and look of regular food makes her sick on stomach. She had LAD stent last month with anemia of acute blood loss. She has not resumed her chemotherapy for multiple myeloma. Her blood pressure was low at 90/58 in office.  Past Medical History:  Diagnosis Date  . Anemia    "off and on all my life" (12/22/2016)  . Anxiety   . Bilateral renal artery stenosis ([place]) 1999   s/p stenting  . Chronic kidney disease (CKD), stage III (moderate)    Abigail [name] 12/22/2016  . Coronary artery disease 1999  . Depression   . Diverticulosis   . Duodenitis   . Gastric erosions   . Gastric ulcer   . GERD (gastroesophageal reflux disease)   . Heart murmur   . History of blood transfusion ~ 03/2016   "low HgB; practically nonexistent"  . Hyperlipidemia   . Hypertension   . MI (myocardial infarction) 1999  . Multiple myeloma ([place]) dx'd ~ 04/2016  . PAT (paroxysmal atrial tachycardia) ([place]) 12/22/2016   Abigail [name] 12/22/2016  . Retinal hemorrhage, right eye   . Schatzki's ring   . Tachy-brady syndrome ([place])    Abigail [name] 12/22/2016  . Tubular adenoma of colon       Past Surgical History:  Procedure Laterality Date  . APPENDECTOMY    . BACK SURGERY    . CATARACT EXTRACTION W/ INTRAOCULAR LENS IMPLANT Right 2014  . CORONARY ANGIOPLASTY WITH STENT PLACEMENT  1999  . CORONARY STENT INTERVENTION N/A 12/29/2016   Procedure: Coronary Stent Intervention;  Surgeon: Abigail Forward, MD;  Location: [facility];  Service: Cardiovascular;  Laterality: N/A;  .  ESOPHAGOGASTRODUODENOSCOPY N/A 11/03/2015   Procedure: ESOPHAGOGASTRODUODENOSCOPY (EGD);  Surgeon: Abigail Ada, MD;  Location: [facility] ENDOSCOPY;  Service: Endoscopy;  Laterality: N/A;  . ESOPHAGOGASTRODUODENOSCOPY (EGD) WITH PROPOFOL N/A 11/18/2016   Procedure: ESOPHAGOGASTRODUODENOSCOPY (EGD) WITH PROPOFOL;  Surgeon: Abigail Artist, MD;  Location: [facility] ENDOSCOPY;  Service: Endoscopy;  Laterality: N/A;  . LEFT HEART CATH AND CORONARY ANGIOGRAPHY N/A 12/24/2016   Procedure: Left Heart Cath and Coronary Angiography;  Surgeon: Abigail Dials, MD;  Location: [facility];  Service: Cardiovascular;  Laterality: N/A;  . LUMBAR [place]    . RENAL ARTERY STENT  1999   bil RAS so ? bil vs unilateral stents  . RETINAL LASER PROCEDURE Right 2012   "bleeding"  . TONSILLECTOMY    . TOTAL ABDOMINAL HYSTERECTOMY      Family History  Problem Relation Age of Onset  . Colon cancer Neg Hx    Social History:  reports that she has been smoking Cigarettes.  She has a 25.00 pack-year smoking history. She has never used smokeless tobacco. She reports that she does not drink alcohol or use drugs.  Allergies:  Allergies  Allergen Reactions  . Sulfa Antibiotics Rash    Medications Prior to Admission  Medication Sig Dispense Refill  . acyclovir (ZOVIRAX) 400 MG tablet TAKE 1 TABLET (  400 MG TOTAL) BY MOUTH 2 (TWO) TIMES DAILY. 60 tablet 2  . Ascorbic Acid (VITAMIN C) 1000 MG tablet Take 1,000 mg by mouth daily.     Marland Kitchen aspirin EC 81 MG tablet Take 81 mg by mouth daily.    . calcium carbonate (OS-CAL - DOSED IN MG OF ELEMENTAL CALCIUM) 1250 (500 Ca) MG tablet Take 1 tablet by mouth daily with breakfast.    . Cholecalciferol (VITAMIN D PO) Take 1 capsule by mouth daily.    . DULoxetine (CYMBALTA) 20 MG capsule Take 1 capsule (20 mg total) by mouth daily. 30 capsule 0  . feeding supplement, ENSURE ENLIVE, (ENSURE ENLIVE) LIQD Take 237 mLs by mouth 2 (two) times daily between meals. (Patient taking differently:  Take 237 mLs by mouth See admin instructions. Two to three times a day) 237 mL 30  . metoprolol tartrate (LOPRESSOR) 25 MG tablet Take 0.5 tablets (12.5 mg total) by mouth 2 (two) times daily. 30 tablet 3  . midodrine (PROAMATINE) 2.5 MG tablet Take 1 tablet (2.5 mg total) by mouth 2 (two) times daily with a meal. 30 tablet 0  . mirtazapine (REMERON) 15 MG tablet Take 1 tablet (15 mg total) by mouth at bedtime. 30 tablet 3  . pantoprazole (PROTONIX) 40 MG tablet Take 1 tablet (40 mg total) by mouth 2 (two) times daily before a meal. 60 tablet 0  . polyethylene glycol (MIRALAX / GLYCOLAX) packet Take 17 g by mouth daily as needed for moderate constipation.     . potassium chloride (K-DUR,KLOR-CON) 10 MEQ tablet Take 1 tablet (10 mEq total) by mouth daily. 30 tablet 1  . pravastatin (PRAVACHOL) 40 MG tablet Take 40 mg by mouth every evening.  1  . ticagrelor (BRILINTA) 90 MG TABS tablet Take 1 tablet (90 mg total) by mouth 2 (two) times daily. 60 tablet 3    No results found for this or any previous visit (from the past 48 hour(s)). No results found.  Review Of Systems Constitutional: No fever, chills, positive weight loss. Eyes: Positive vision change, wears glasses. No discharge or pain. Ears: No hearing loss, No tinnitus. Respiratory: No asthma, COPD, pneumonias. Positive shortness of breath. No hemoptysis. Cardiovascular: Positive chest pain, palpitation, no leg edema. Gastrointestinal: Positive nausea, vomiting, no diarrhea, constipation. No GI bleed. No hepatitis. Genitourinary: No dysuria, hematuria, kidney stone. No incontinance. Neurological: No headache, stroke, seizures.  Psychiatry: No psych facility admission for anxiety, depression, suicide. No detox. Skin: No rash. Musculoskeletal: Positive joint pain, no fibromyalgia. Positive neck pain, back pain. Lymphadenopathy: No lymphadenopathy. Hematology: Positive anemia. No easy bruising.   There were no vitals taken for this  visit. There is no height or weight on file to calculate BMI. General appearance: alert, cooperative, appears stated age and no distress Head: Normocephalic, atraumatic. Eyes: Brown eyes, pale pink conjunctiva, corneas clear. PERRL, EOM's intact. Throat: Edentulous, dry tongue. Neck: No adenopathy, no carotid bruit, no JVD, supple, symmetrical, trachea midline and thyroid not enlarged. Resp: Clear to auscultation bilaterally. Cardio: Regular rate and rhythm, S1, S2 normal, II/VI systolic murmur, no click, rub or gallop GI: Soft, non-tender; bowel sounds normal; no organomegaly. Extremities: No edema, cyanosis or clubbing. Skin: Warm and dry.  Neurologic: Alert and oriented X 3, normal strength. Normal coordination and unsteady gait.  Assessment/Plan Dehydration Chest pain CAD LAD stent Unsteady gait from low blood pressure Anemia of blood loss Multiple myeloma GERD Abnormal weight loss CKD, II Protein calorie malnutrition.  Place in observation. IV fluids,  blood work. R/O MI.   Birdie Riddle, MD  01/21/2017, 5:38 PM

## 2017-01-21 NOTE — Progress Notes (Signed)
Pt complaining of chest pain rated 3-4.  She describes it as pressure. She vomited 100-200cc's clear liquid. VSS. Sinus rhythm on monitor. Dr. Doylene Canard notified of above and also notified him of patient's Troponin 0.08. Orders placed for Heparin gtt for ACS,Zofran,and cardiac telemetry bed.  Bed request made.

## 2017-01-22 ENCOUNTER — Encounter (HOSPITAL_COMMUNITY): Payer: Self-pay | Admitting: *Deleted

## 2017-01-22 DIAGNOSIS — F329 Major depressive disorder, single episode, unspecified: Secondary | ICD-10-CM | POA: Diagnosis present

## 2017-01-22 DIAGNOSIS — N183 Chronic kidney disease, stage 3 (moderate): Secondary | ICD-10-CM | POA: Diagnosis present

## 2017-01-22 DIAGNOSIS — R627 Adult failure to thrive: Secondary | ICD-10-CM | POA: Diagnosis present

## 2017-01-22 DIAGNOSIS — I951 Orthostatic hypotension: Secondary | ICD-10-CM | POA: Diagnosis present

## 2017-01-22 DIAGNOSIS — Z6822 Body mass index (BMI) 22.0-22.9, adult: Secondary | ICD-10-CM | POA: Diagnosis not present

## 2017-01-22 DIAGNOSIS — E860 Dehydration: Secondary | ICD-10-CM | POA: Diagnosis present

## 2017-01-22 DIAGNOSIS — Z7982 Long term (current) use of aspirin: Secondary | ICD-10-CM | POA: Diagnosis not present

## 2017-01-22 DIAGNOSIS — K219 Gastro-esophageal reflux disease without esophagitis: Secondary | ICD-10-CM | POA: Diagnosis present

## 2017-01-22 DIAGNOSIS — I25110 Atherosclerotic heart disease of native coronary artery with unstable angina pectoris: Secondary | ICD-10-CM | POA: Diagnosis present

## 2017-01-22 DIAGNOSIS — F17210 Nicotine dependence, cigarettes, uncomplicated: Secondary | ICD-10-CM | POA: Diagnosis present

## 2017-01-22 DIAGNOSIS — Z955 Presence of coronary angioplasty implant and graft: Secondary | ICD-10-CM | POA: Diagnosis not present

## 2017-01-22 DIAGNOSIS — I471 Supraventricular tachycardia: Secondary | ICD-10-CM | POA: Diagnosis present

## 2017-01-22 DIAGNOSIS — R079 Chest pain, unspecified: Secondary | ICD-10-CM | POA: Diagnosis present

## 2017-01-22 DIAGNOSIS — I252 Old myocardial infarction: Secondary | ICD-10-CM | POA: Diagnosis not present

## 2017-01-22 DIAGNOSIS — D500 Iron deficiency anemia secondary to blood loss (chronic): Secondary | ICD-10-CM | POA: Diagnosis present

## 2017-01-22 DIAGNOSIS — C9000 Multiple myeloma not having achieved remission: Secondary | ICD-10-CM | POA: Diagnosis present

## 2017-01-22 DIAGNOSIS — I495 Sick sinus syndrome: Secondary | ICD-10-CM | POA: Diagnosis present

## 2017-01-22 DIAGNOSIS — Z79899 Other long term (current) drug therapy: Secondary | ICD-10-CM | POA: Diagnosis not present

## 2017-01-22 DIAGNOSIS — I2584 Coronary atherosclerosis due to calcified coronary lesion: Secondary | ICD-10-CM | POA: Diagnosis present

## 2017-01-22 DIAGNOSIS — Z7902 Long term (current) use of antithrombotics/antiplatelets: Secondary | ICD-10-CM | POA: Diagnosis not present

## 2017-01-22 DIAGNOSIS — R55 Syncope and collapse: Secondary | ICD-10-CM | POA: Diagnosis not present

## 2017-01-22 DIAGNOSIS — E440 Moderate protein-calorie malnutrition: Secondary | ICD-10-CM | POA: Diagnosis present

## 2017-01-22 DIAGNOSIS — E785 Hyperlipidemia, unspecified: Secondary | ICD-10-CM | POA: Diagnosis present

## 2017-01-22 DIAGNOSIS — I129 Hypertensive chronic kidney disease with stage 1 through stage 4 chronic kidney disease, or unspecified chronic kidney disease: Secondary | ICD-10-CM | POA: Diagnosis present

## 2017-01-22 LAB — TROPONIN I
TROPONIN I: 0.08 ng/mL — AB (ref <0.03)
Troponin I: 0.08 ng/mL (ref <0.03)

## 2017-01-22 LAB — BASIC METABOLIC PANEL
Anion gap: 12 (ref 5–15)
BUN: 34 mg/dL — ABNORMAL HIGH (ref 6–20)
CHLORIDE: 109 mmol/L (ref 101–111)
CO2: 20 mmol/L — ABNORMAL LOW (ref 22–32)
CREATININE: 2.02 mg/dL — AB (ref 0.44–1.00)
Calcium: 9.4 mg/dL (ref 8.9–10.3)
GFR calc Af Amer: 28 mL/min — ABNORMAL LOW (ref >60)
GFR, EST NON AFRICAN AMERICAN: 24 mL/min — AB (ref >60)
GLUCOSE: 87 mg/dL (ref 65–99)
Potassium: 4.1 mmol/L (ref 3.5–5.1)
Sodium: 141 mmol/L (ref 135–145)

## 2017-01-22 LAB — CBC
HCT: 33.7 % — ABNORMAL LOW (ref 36.0–46.0)
Hemoglobin: 10.8 g/dL — ABNORMAL LOW (ref 12.0–15.0)
MCH: 30 pg (ref 26.0–34.0)
MCHC: 32 g/dL (ref 30.0–36.0)
MCV: 93.6 fL (ref 78.0–100.0)
PLATELETS: 181 10*3/uL (ref 150–400)
RBC: 3.6 MIL/uL — ABNORMAL LOW (ref 3.87–5.11)
RDW: 17.3 % — ABNORMAL HIGH (ref 11.5–15.5)
WBC: 5.8 10*3/uL (ref 4.0–10.5)

## 2017-01-22 LAB — HEPARIN LEVEL (UNFRACTIONATED)
HEPARIN UNFRACTIONATED: 1.07 [IU]/mL — AB (ref 0.30–0.70)
Heparin Unfractionated: 0.83 IU/mL — ABNORMAL HIGH (ref 0.30–0.70)

## 2017-01-22 LAB — MRSA PCR SCREENING: MRSA by PCR: NEGATIVE

## 2017-01-22 MED ORDER — ENSURE ENLIVE PO LIQD
237.0000 mL | Freq: Four times a day (QID) | ORAL | Status: DC
  Administered 2017-01-22 – 2017-01-24 (×6): 237 mL via ORAL

## 2017-01-22 MED ORDER — METOPROLOL TARTRATE 12.5 MG HALF TABLET
12.5000 mg | ORAL_TABLET | Freq: Two times a day (BID) | ORAL | Status: DC
  Administered 2017-01-22 – 2017-01-23 (×3): 12.5 mg via ORAL
  Filled 2017-01-22 (×3): qty 1

## 2017-01-22 MED ORDER — HEPARIN (PORCINE) IN NACL 100-0.45 UNIT/ML-% IJ SOLN
400.0000 [IU]/h | INTRAMUSCULAR | Status: DC
  Administered 2017-01-22: 550 [IU]/h via INTRAVENOUS

## 2017-01-22 MED ORDER — ADULT MULTIVITAMIN W/MINERALS CH
1.0000 | ORAL_TABLET | Freq: Every day | ORAL | Status: DC
Start: 2017-01-22 — End: 2017-01-25
  Administered 2017-01-23 – 2017-01-25 (×3): 1 via ORAL
  Filled 2017-01-22 (×3): qty 1

## 2017-01-22 NOTE — Progress Notes (Signed)
ANTICOAGULATION CONSULT NOTE - Follow-up Consult  Pharmacy Consult for Heparin Indication: chest pain/ACS/STEMI  Allergies  Allergen Reactions  . Sulfa Antibiotics Rash    Patient Measurements: Height: 5\' 5"  (165.1 cm) Weight: 132 lb 15 oz (60.3 kg) IBW/kg (Calculated) : 57 Heparin Dosing Weight: 60.3 kg  Vital Signs: Temp: 98.3 F (36.8 C) (04/05 0757) Temp Source: Oral (04/05 0757) BP: 111/48 (04/05 0757) Pulse Rate: 34 (04/05 0757)  Labs:  Recent Labs  01/21/17 1849 01/22/17 0002 01/22/17 0500 01/22/17 1438  HGB 11.4*  --  10.8*  --   HCT 35.6*  --  33.7*  --   PLT 203  --  181  --   HEPARINUNFRC  --   --  1.07* 0.83*  CREATININE 2.37*  --  2.02*  --   TROPONINI 0.08* 0.08* 0.08*  --     Estimated Creatinine Clearance: 23.3 mL/min (A) (by C-G formula based on SCr of 2.02 mg/dL (H)).  Assessment: 71 yr old female with hx LAD stent 12/29/16 on IV heparin for ACS/STEMI. Heparin level remains supratherapeutic (0.83) but improved after drip held x 1h and rate reduced. CBC stable. No bleeding or IV line issues per RN - level drawn appropriately from opposite arm.  Goal of Therapy:  Heparin level 0.3-0.7 units/ml Monitor platelets by anticoagulation protocol: Yes   Plan:   Decrease heparin drip to 400 units/hr Will f/u 8 hr heparin level Daily heparin level/CBC Monitor for s/sx bleeding  Elicia Lamp, PharmD, BCPS Clinical Pharmacist 01/22/2017 4:09 PM

## 2017-01-22 NOTE — Progress Notes (Signed)
Initial Nutrition Assessment  DOCUMENTATION CODES:   Non-severe (moderate) malnutrition in context of chronic illness  INTERVENTION:   -Ensure Enlive po QID, each supplement provides 350 kcal and 20 grams of protein -MVI daily  NUTRITION DIAGNOSIS:   Malnutrition (moderate) related to chronic illness (multiple myeloma) as evidenced by percent weight loss, energy intake < 75% for > or equal to 3 months, mild depletion of body fat, moderate depletion of body fat, mild depletion of muscle mass, moderate depletions of muscle mass.  GOAL:   Patient will meet greater than or equal to 90% of their needs  MONITOR:   PO intake, Supplement acceptance, Labs, Weight trends, Skin, I & O's  REASON FOR ASSESSMENT:   Malnutrition Screening Tool    ASSESSMENT:   71 years old female with PMH of multiple myeloma, hyperlipidemia, hypertension, CAD and anemia has weakness, dizziness, decreased appetite. She also has non-specific chest pain. She has lost additional 2-3 pounds of weight in 1 month. She claims to drink 3 bottles of Ensure per day but smell and look of regular food makes her sick on stomach. She had LAD stent last month with anemia of acute blood loss. She has not resumed her chemotherapy for multiple myeloma. Her blood pressure was low at 90/58 in office.  Pt admitted with dehydration.  Spoke with pt, who per nurse tech is preparing to transfer to telemetry floor. Pt reports ongoing poor appetite and weight loss, which she attributes to diagnosis of multiple myeloma and nausea. She shares intake consists mainly of liquids, mainly 2-3 Ensure supplements daily and juices. Pt had consumed 75% of Ensure supplement prior to RD visit. She shares that she smell of foods make her nauseous ("once I smell it, it's over"). When counseling pt about choosing colder food items with less odors to promote PO intake, she states that essentially all foods cause nausea, which impedes her ability to eat solid  foods.   Pt unsure of UBW, but estimates she has progressively lost weight over the past 1-2 years. She reports wt loss has intensified over the past few months. Noted pt has experienced a 20% wt loss over the past 6 months.   Nutrition-Focused physical exam completed. Findings are mild to moderate fat depletion, mild to moderate muscle depletion, and no edema.   Encouraged pt to continue to consume supplements. Discussed importance of good protein intake to support healing.   Labs reviewed.   Diet Order:  Diet Heart Room service appropriate? Yes; Fluid consistency: Thin  Skin:  Reviewed, no issues  Last BM:  01/20/17  Height:   Ht Readings from Last 1 Encounters:  01/21/17 '5\' 5"'  (1.651 m)    Weight:   Wt Readings from Last 1 Encounters:  01/21/17 132 lb 15 oz (60.3 kg)    Ideal Body Weight:  56.8 kg  BMI:  Body mass index is 22.12 kg/m.  Estimated Nutritional Needs:   Kcal:  1800-2000  Protein:  85-100 grams  Fluid:  1.8-2.0 L  EDUCATION NEEDS:   Education needs addressed  Isahi Godwin A. Jimmye Norman, RD, LDN, CDE Pager: [phone] After hours Pager: [phone]

## 2017-01-22 NOTE — Progress Notes (Signed)
Report given to Janett Billow, RN for transfer to [phone]; patient and all belongings transferred at this time.

## 2017-01-22 NOTE — Progress Notes (Signed)
ANTICOAGULATION CONSULT NOTE - Follow-up Consult  Pharmacy Consult for Heparin Indication: chest pain/ACS/STEMI  Allergies  Allergen Reactions  . Sulfa Antibiotics Rash    Patient Measurements: Height: 5\' 5"  (165.1 cm) Weight: 132 lb 15 oz (60.3 kg) IBW/kg (Calculated) : 57 Heparin Dosing Weight: 60.3 kg  Vital Signs: Temp: 99 F (37.2 C) (04/05 0338) Temp Source: Oral (04/05 0338) BP: 105/66 (04/05 0338) Pulse Rate: 70 (04/05 0338)  Labs:  Recent Labs  01/21/17 1849 01/22/17 0002 01/22/17 0500  HGB 11.4*  --  10.8*  HCT 35.6*  --  33.7*  PLT 203  --  181  HEPARINUNFRC  --   --  1.07*  CREATININE 2.37*  --   --   TROPONINI 0.08* 0.08*  --     Estimated Creatinine Clearance: 19.9 mL/min (A) (by C-G formula based on SCr of 2.37 mg/dL (H)).  Assessment: 71 yr old female with hx LAD stent 12/29/16 on IV heparin for ACS/STEMI. Heparin level supratherapeutic (1.07) on gtt at 750 units/hr. CBC stable. No bleeding noted. RN states that heparin level was drawn from arm opposite where heparin infusing.  Goal of Therapy:  Heparin level 0.3-0.7 units/ml Monitor platelets by anticoagulation protocol: Yes   Plan:  Hold heparin x 1 hour  Decrease heparin drip to 550 units/hr Will f/u 8 hr heparin level after restart  Sherlon Handing, PharmD, BCPS Clinical pharmacist, pager [phone] 01/22/2017,5:41 AM

## 2017-01-22 NOTE — Progress Notes (Signed)
[facility]  Patient Status: Active (receiving services up to time of hospitalization)  [facility] is providing the following services: RN, PT, OT and HHA  If patient discharges after hours, please call [phone].   Janae Sauce 01/22/2017, 10:35 AM

## 2017-01-22 NOTE — Progress Notes (Signed)
Ref: Philis Fendt, MD   Subjective:  Had episodes of chest pain last night requiring transfer to step down. Also has short run of SVT followed by sinus bradycardia. Patient was asymptomatic. Troponin-I is slightly high. Feeling better today.  Objective:  Vital Signs in the last 24 hours: Temp:  [98.3 F (36.8 C)-99 F (37.2 C)] 98.3 F (36.8 C) (04/05 0757) Pulse Rate:  [34-73] 34 (04/05 0757) Cardiac Rhythm: Normal sinus rhythm (04/05 0800) Resp:  [12-25] 14 (04/05 0757) BP: (96-131)/(48-77) 111/48 (04/05 0757) SpO2:  [98 %-100 %] 100 % (04/05 0757) Weight:  [60.3 kg (132 lb 15 oz)] 60.3 kg (132 lb 15 oz) (04/04 2100)  Physical Exam: BP Readings from Last 1 Encounters:  01/22/17 (!) 111/48    Wt Readings from Last 1 Encounters:  01/21/17 60.3 kg (132 lb 15 oz)    Weight change:  Body mass index is 22.12 kg/m. HEENT: [place]/AT, Eyes-Brown, PERL, EOMI, Conjunctiva-Pale pink, Sclera-Non-icteric Neck: No JVD, No bruit, Trachea midline. Lungs:  Clear, Bilateral. Cardiac:  Regular rhythm, normal S1 and S2, no S3. II/VI systolic murmur. Abdomen:  Soft, non-tender. BS present. Extremities:  No edema present. No cyanosis. No clubbing. CNS: AxOx3, Cranial nerves grossly intact, moves all 4 extremities.  Skin: Warm and dry.   Intake/Output from previous day: 04/04 0701 - 04/05 0700 In: 912.6 [I.V.:912.6] Out: 750 [Urine:750]    Lab Results: BMET    Component Value Date/Time   NA 141 01/22/2017 0500   NA 135 01/21/2017 1849   NA 141 12/31/2016 0250   NA 140 12/18/2016 0932   NA 140 12/11/2016 1437   NA 139 14-Jan-202018 1145   K 4.1 01/22/2017 0500   K 4.8 01/21/2017 1849   K 4.1 12/31/2016 0250   K 4.9 12/18/2016 0932   K 4.2 12/11/2016 1437   K 3.9 14-Jan-202018 1145   CL 109 01/22/2017 0500   CL 101 01/21/2017 1849   CL 112 (H) 12/31/2016 0250   CO2 20 (L) 01/22/2017 0500   CO2 21 (L) 01/21/2017 1849   CO2 21 (L) 12/31/2016 0250   CO2 24 12/18/2016 0932   CO2 22  12/11/2016 1437   CO2 21 (L) 14-Jan-202018 1145   GLUCOSE 87 01/22/2017 0500   GLUCOSE 101 (H) 01/21/2017 1849   GLUCOSE 104 (H) 12/31/2016 0250   GLUCOSE 138 12/18/2016 0932   GLUCOSE 119 12/11/2016 1437   GLUCOSE 123 14-Jan-202018 1145   BUN 34 (H) 01/22/2017 0500   BUN 39 (H) 01/21/2017 1849   BUN 13 12/31/2016 0250   BUN 16.8 12/18/2016 0932   BUN 17.6 12/11/2016 1437   BUN 13.6 14-Jan-202018 1145   CREATININE 2.02 (H) 01/22/2017 0500   CREATININE 2.37 (H) 01/21/2017 1849   CREATININE 0.81 12/31/2016 0250   CREATININE 1.7 (H) 12/18/2016 0932   CREATININE 1.6 (H) 12/11/2016 1437   CREATININE 1.2 (H) 14-Jan-202018 1145   CALCIUM 9.4 01/22/2017 0500   CALCIUM 9.9 01/21/2017 1849   CALCIUM 9.9 12/31/2016 0250   CALCIUM 9.9 12/18/2016 0932   CALCIUM 10.3 12/11/2016 1437   CALCIUM 9.9 14-Jan-202018 1145   GFRNONAA 24 (L) 01/22/2017 0500   GFRNONAA 20 (L) 01/21/2017 1849   GFRNONAA >60 12/31/2016 0250   GFRAA 28 (L) 01/22/2017 0500   GFRAA 23 (L) 01/21/2017 1849   GFRAA >60 12/31/2016 0250   CBC    Component Value Date/Time   WBC 5.8 01/22/2017 0500   RBC 3.60 (L) 01/22/2017 0500   HGB 10.8 (  L) 01/22/2017 0500   HGB 14.0 12/18/2016 0932   HCT 33.7 (L) 01/22/2017 0500   HCT 42.8 12/18/2016 0932   PLT 181 01/22/2017 0500   PLT 221 12/18/2016 0932   MCV 93.6 01/22/2017 0500   MCV 92.0 12/18/2016 0932   MCH 30.0 01/22/2017 0500   MCHC 32.0 01/22/2017 0500   RDW 17.3 (H) 01/22/2017 0500   RDW 17.6 (H) 12/18/2016 0932   LYMPHSABS 1.3 01/21/2017 1849   LYMPHSABS 1.2 12/18/2016 0932   MONOABS 0.9 01/21/2017 1849   MONOABS 0.7 12/18/2016 0932   EOSABS 0.1 01/21/2017 1849   EOSABS 0.1 12/18/2016 0932   BASOSABS 0.0 01/21/2017 1849   BASOSABS 0.1 12/18/2016 0932   HEPATIC Function Panel  Recent Labs  12/18/16 0932 12/22/16 2008 01/21/17 1849  PROT 6.4 6.2* 6.4*   HEMOGLOBIN A1C No components found for: HGA1C,  MPG CARDIAC ENZYMES Lab Results  Component Value Date   CKTOTAL  129 01/01/2012   CKMB 4.6 (H) 01/01/2012   TROPONINI 0.08 (HH) 01/22/2017   TROPONINI 0.08 (HH) 01/22/2017   TROPONINI 0.08 (HH) 01/21/2017   BNP No results for input(s): PROBNP in the last 8760 hours. TSH  Recent Labs  12/06/16 2005 12/18/16 1056 12/22/16 2008  TSH 0.541 0.576 0.481   CHOLESTEROL  Recent Labs  12/07/16 0605  CHOL 220*    Scheduled Meds: . aspirin EC  81 mg Oral Daily  . calcium carbonate  1 tablet Oral Q breakfast  . cholecalciferol  1,000 Units Oral Daily  . DULoxetine  20 mg Oral Daily  . feeding supplement (ENSURE ENLIVE)  237 mL Oral BID BM  . metoprolol tartrate  12.5 mg Oral BID  . mirtazapine  15 mg Oral QHS  . pantoprazole  40 mg Oral Daily  . potassium chloride  10 mEq Oral Daily  . pravastatin  40 mg Oral QPM  . ticagrelor  90 mg Oral BID  . vitamin C  1,000 mg Oral Daily   Continuous Infusions: . sodium chloride 75 mL/hr at 01/22/17 0800  . heparin     PRN Meds:.ondansetron (ZOFRAN) IV  Assessment/Plan: Dehydration- improving Acute coronary syndrome CAD LAD and diagonal stent Anemia of blood loss Multiple myeloma Tachybrady syndrome GERD Abnormal weight loss CKD, III Protein calorie malnutrition  Continue IV fluids Most likely has diagonal stent thrombosis or distal vessel disease. Troponin-I is minimally elevated. Will postpone cardiac cath for now and continue IV heparin as renal function is improving. Transfer to telemetry.   LOS: 0 days    [name]  MD  01/22/2017, 9:13 AM

## 2017-01-23 ENCOUNTER — Inpatient Hospital Stay (HOSPITAL_COMMUNITY): Payer: Medicare HMO

## 2017-01-23 ENCOUNTER — Encounter (HOSPITAL_COMMUNITY)
Admission: AD | Disposition: A | Discharge status: Home Health | Payer: Self-pay | Source: Ambulatory Visit | Attending: Cardiovascular Disease

## 2017-01-23 ENCOUNTER — Telehealth (HOSPITAL_COMMUNITY): Payer: Self-pay | Admitting: Internal Medicine

## 2017-01-23 ENCOUNTER — Encounter (HOSPITAL_COMMUNITY): Payer: Self-pay | Admitting: Cardiovascular Disease

## 2017-01-23 DIAGNOSIS — I471 Supraventricular tachycardia: Secondary | ICD-10-CM

## 2017-01-23 DIAGNOSIS — E860 Dehydration: Secondary | ICD-10-CM

## 2017-01-23 DIAGNOSIS — R55 Syncope and collapse: Secondary | ICD-10-CM

## 2017-01-23 HISTORY — PX: CORONARY ANGIOGRAPHY: CATH118303

## 2017-01-23 LAB — CBC
HCT: 33.4 % — ABNORMAL LOW (ref 36.0–46.0)
Hemoglobin: 10.6 g/dL — ABNORMAL LOW (ref 12.0–15.0)
MCH: 30 pg (ref 26.0–34.0)
MCHC: 31.7 g/dL (ref 30.0–36.0)
MCV: 94.6 fL (ref 78.0–100.0)
Platelets: 174 10*3/uL (ref 150–400)
RBC: 3.53 MIL/uL — ABNORMAL LOW (ref 3.87–5.11)
RDW: 17.2 % — ABNORMAL HIGH (ref 11.5–15.5)
WBC: 4.6 10*3/uL (ref 4.0–10.5)

## 2017-01-23 LAB — POCT ACTIVATED CLOTTING TIME: Activated Clotting Time: 87 seconds

## 2017-01-23 LAB — BASIC METABOLIC PANEL
Anion gap: 12 (ref 5–15)
BUN: 23 mg/dL — AB (ref 6–20)
CHLORIDE: 110 mmol/L (ref 101–111)
CO2: 21 mmol/L — ABNORMAL LOW (ref 22–32)
CREATININE: 1.5 mg/dL — AB (ref 0.44–1.00)
Calcium: 9.2 mg/dL (ref 8.9–10.3)
GFR calc Af Amer: 40 mL/min — ABNORMAL LOW (ref >60)
GFR calc non Af Amer: 34 mL/min — ABNORMAL LOW (ref >60)
Glucose, Bld: 89 mg/dL (ref 65–99)
Potassium: 4 mmol/L (ref 3.5–5.1)
SODIUM: 143 mmol/L (ref 135–145)

## 2017-01-23 LAB — HEPARIN LEVEL (UNFRACTIONATED)
HEPARIN UNFRACTIONATED: 0.41 [IU]/mL (ref 0.30–0.70)
Heparin Unfractionated: 0.51 IU/mL (ref 0.30–0.70)

## 2017-01-23 SURGERY — CORONARY ANGIOGRAPHY (CATH LAB)
Anesthesia: LOCAL

## 2017-01-23 MED ORDER — SODIUM CHLORIDE 0.9% FLUSH: 3.0000 mL | INTRAVENOUS | Status: DC | PRN

## 2017-01-23 MED ORDER — SODIUM CHLORIDE 0.9% FLUSH: 3.0000 mL | Freq: Two times a day (BID) | INTRAVENOUS | Status: DC

## 2017-01-23 MED ORDER — SODIUM CHLORIDE 0.9 % IV SOLN: INTRAVENOUS | Status: AC

## 2017-01-23 MED ORDER — FENTANYL CITRATE (PF) 100 MCG/2ML IJ SOLN
INTRAMUSCULAR | Status: AC
  Filled 2017-01-23: qty 2

## 2017-01-23 MED ORDER — SODIUM CHLORIDE 0.9 % IV SOLN
INTRAVENOUS | Status: DC
  Administered 2017-01-23: 09:00:00 via INTRAVENOUS

## 2017-01-23 MED ORDER — IOPAMIDOL (ISOVUE-370) INJECTION 76%
INTRAVENOUS | Status: AC
  Filled 2017-01-23: qty 50

## 2017-01-23 MED ORDER — LIDOCAINE HCL (PF) 1 % IJ SOLN
INTRAMUSCULAR | Status: AC
  Filled 2017-01-23: qty 30

## 2017-01-23 MED ORDER — HEPARIN (PORCINE) IN NACL 2-0.9 UNIT/ML-% IJ SOLN
INTRAMUSCULAR | Status: AC
  Filled 2017-01-23: qty 1000

## 2017-01-23 MED ORDER — FENTANYL CITRATE (PF) 100 MCG/2ML IJ SOLN
INTRAMUSCULAR | Status: DC | PRN
  Administered 2017-01-23: 25 ug via INTRAVENOUS

## 2017-01-23 MED ORDER — SODIUM CHLORIDE 0.9 % IV SOLN: 250.0000 mL | INTRAVENOUS | Status: DC | PRN

## 2017-01-23 MED ORDER — SODIUM CHLORIDE 0.9% FLUSH
3.0000 mL | Freq: Two times a day (BID) | INTRAVENOUS | Status: DC
  Administered 2017-01-23 – 2017-01-24 (×3): 3 mL via INTRAVENOUS

## 2017-01-23 MED ORDER — HEPARIN (PORCINE) IN NACL 2-0.9 UNIT/ML-% IJ SOLN
INTRAMUSCULAR | Status: DC | PRN
  Administered 2017-01-23: 1000 mL

## 2017-01-23 MED ORDER — LIDOCAINE HCL (PF) 1 % IJ SOLN
INTRAMUSCULAR | Status: DC | PRN
  Administered 2017-01-23: 14 mL

## 2017-01-23 MED ORDER — PYRIDOSTIGMINE BROMIDE 60 MG PO TABS
30.0000 mg | ORAL_TABLET | Freq: Two times a day (BID) | ORAL | Status: DC
  Administered 2017-01-23 – 2017-01-25 (×4): 30 mg via ORAL
  Filled 2017-01-23 (×5): qty 0.5

## 2017-01-23 MED ORDER — IOPAMIDOL (ISOVUE-370) INJECTION 76%
INTRAVENOUS | Status: DC | PRN
  Administered 2017-01-23: 40 mL via INTRAVENOUS

## 2017-01-23 SURGICAL SUPPLY — 7 items
CATH INFINITI 5FR JL4 (CATHETERS) ×1 IMPLANT
CATH INFINITI JR4 5F (CATHETERS) ×1 IMPLANT
KIT HEART LEFT (KITS) ×2 IMPLANT
PACK CARDIAC CATHETERIZATION (CUSTOM PROCEDURE TRAY) ×2 IMPLANT
SHEATH PINNACLE 5F 10CM (SHEATH) ×1 IMPLANT
TRANSDUCER W/STOPCOCK (MISCELLANEOUS) ×2 IMPLANT
WIRE EMERALD 3MM-J .035X150CM (WIRE) ×1 IMPLANT

## 2017-01-23 NOTE — Progress Notes (Signed)
I have been doing frequent groin checks post cath. I came into the room at 1320 to find the patient is less responsive and slow to respond with only being able to tell me she is in the "hospital" and she is "71 Years old". She is able to lift all her extremities however she is generalized weak and doesn't seem to be focusing on her surroundings like she has been. I paged Dr. Doylene Canard and RR RN. They came to bedside and MD ordered CT scan. Pt placed on o2 for support. Pupils are reactive. No drift in upper extremities and lower extremities.

## 2017-01-23 NOTE — Progress Notes (Signed)
[facility] for Heparin Indication: chest pain/ACS/STEMI  Allergies  Allergen Reactions  . Sulfa Antibiotics Rash    Patient Measurements: Height: 5\' 5"  (165.1 cm) Weight: 131 lb (59.4 kg) IBW/kg (Calculated) : 57 Heparin Dosing Weight: 60.3 kg  Vital Signs: Temp: 98.5 F (36.9 C) (04/06 0546) Temp Source: Oral (04/06 0546) BP: 136/57 (04/06 0854) Pulse Rate: 77 (04/06 0546)  Labs:  Recent Labs  01/21/17 1849 01/22/17 0002  01/22/17 0500 01/22/17 1438 01/23/17 0030 01/23/17 0834  HGB 11.4*  --   --  10.8*  --  10.6*  --   HCT 35.6*  --   --  33.7*  --  33.4*  --   PLT 203  --   --  181  --  174  --   HEPARINUNFRC  --   --   < > 1.07* 0.83* 0.51 0.41  CREATININE 2.37*  --   --  2.02*  --  1.50*  --   TROPONINI 0.08* 0.08*  --  0.08*  --   --   --   < > = values in this interval not displayed.   Assessment: 71 yr old female with hx LAD stent 12/29/16 on IV heparin for ACS/STEMI. Heparin level wnl Going for cath now hgb 10.6, plts wnl SCr 2.3 > 1.5   Goal of Therapy:  Heparin level 0.3-0.7 units/ml Monitor platelets by anticoagulation protocol: Yes    Plan:   Continue heparin drip at 400 units/hr Daily heparin level/CBC Monitor for s/sx bleeding F/u after cath      Hughes Better, PharmD, BCPS Clinical Pharmacist 01/23/2017 9:14 AM

## 2017-01-23 NOTE — Progress Notes (Signed)
ANTICOAGULATION CONSULT NOTE - Follow Up Consult  Pharmacy Consult for heparin Indication: chest pain/ACS  Labs:  Recent Labs  01/21/17 1849 01/22/17 0002 01/22/17 0500 01/22/17 1438 01/23/17 0030  HGB 11.4*  --  10.8*  --  10.6*  HCT 35.6*  --  33.7*  --  33.4*  PLT 203  --  181  --  174  HEPARINUNFRC  --   --  1.07* 0.83* 0.51  CREATININE 2.37*  --  2.02*  --   --   TROPONINI 0.08* 0.08* 0.08*  --   --     Assessment/Plan:  71yo female therapeutic on heparin after rate changes. Will continue gtt at current rate and confirm stable with additional level.   Wynona Neat, PharmD, BCPS  01/23/2017,1:17 AM

## 2017-01-23 NOTE — Consult Note (Signed)
ELECTROPHYSIOLOGY CONSULT NOTE    Patient ID: Abigail [name] MRN: [id number], DOB/AGE: [birth year] 71 y.o.  Admit date: 01/21/2017 Date of Consult: 01/23/2017   Primary Physician: Philis Fendt, MD Primary Cardiologist: Dr. Doylene Canard  Reason for Consultation: evaluate heart rhythm  HPI: Abigail [name] is a 71 y.o. female who is being seen today for the evaluation of her heart rhythm abnormality at the request of Kadakia.  PMHx includes multiple myeloma (oncology noted diffuse lytic bone lesions, chemo on hold since January secondary to failure to thrive, follows with Dr. Benay Spice ), HLD, CAD, HTN, chronic anemia, and record notes PAT.  She was admitted by Dr. Debbora Dus with c/o CP given her history of cath/PCI to LAD complicated by dissection 12/29/16, hypotension suspect secondary to dehydration, poor PO intake.  She is s/p LHC this morning, Dr. Doylene Canard reported with stable CAD, no intervention, Dr.Kadakia reports observations of HR 30's and 130's and EP is asked to evaluate regarding this.  The patient is s/p LHC, she is still sleepy but conversational.  She tells me that she feels quick fluttering heart beats a couple times a week, they last generally a second or so, some times they can come/go for several minutes, these days she tends to just slow down what she is doing.  She reports having dizzy spells but has not correlated these with timing of her palpitations.  She reports a fainting episode a couple weeks ago, walking towards the BR became dizzy, lightheaded and fainted.  She says that is what started "all this" last month.   LABS: K+ 4.0 BUN/Creat 23/1.50 WBC 4.6 WBC 10.6 33.4 plts 174  Past Medical History:  Diagnosis Date  . Anemia    "off and on all my life" (12/22/2016)  . Anxiety   . Bilateral renal artery stenosis ([place]) 1999   s/p stenting  . Chronic kidney disease (CKD), stage III (moderate)    Ar[facility] Endo 12/22/2016  . Coronary artery disease 1999  . Depression   . Diverticulosis     . Duodenitis   . Gastric erosions   . Gastric ulcer   . GERD (gastroesophageal reflux disease)   . Heart murmur   . History of blood transfusion ~ 03/2016   "low HgB; practically nonexistent"  . Hyperlipidemia   . Hypertension   . MI (myocardial infarction) 1999  . Multiple myeloma ([place]) dx'd ~ 04/2016  . PAT (paroxysmal atrial tachycardia) ([place]) 12/22/2016   [facility] Endo 12/22/2016  . Retinal hemorrhage, right eye   . Schatzki's ring   . Tachy-brady syndrome ([place])    [facility] Endo 12/22/2016  . Tubular adenoma of colon      Surgical History:  Past Surgical History:  Procedure Laterality Date  . APPENDECTOMY    . BACK SURGERY    . CATARACT EXTRACTION W/ INTRAOCULAR LENS IMPLANT Right 2014  . CORONARY ANGIOPLASTY WITH STENT PLACEMENT  1999  . CORONARY STENT INTERVENTION N/A 12/29/2016   Procedure: Coronary Stent Intervention;  Surgeon: Charolette Forward, MD;  Location: [facility];  Service: Cardiovascular;  Laterality: N/A;  . ESOPHAGOGASTRODUODENOSCOPY N/A 11/03/2015   Procedure: ESOPHAGOGASTRODUODENOSCOPY (EGD);  Surgeon: Carol Ada, MD;  Location: [facility] ENDOSCOPY;  Service: Endoscopy;  Laterality: N/A;  . ESOPHAGOGASTRODUODENOSCOPY (EGD) WITH PROPOFOL N/A 11/18/2016   Procedure: ESOPHAGOGASTRODUODENOSCOPY (EGD) WITH PROPOFOL;  Surgeon: Ladene Artist, MD;  Location: [facility] ENDOSCOPY;  Service: Endoscopy;  Laterality: N/A;  . LEFT HEART CATH AND CORONARY ANGIOGRAPHY N/A 12/24/2016   Procedure: Left Heart Cath and Coronary  Angiography;  Surgeon: [name], MD;  Location: [facility];  Service: Cardiovascular;  Laterality: N/A;  . LUMBAR [place]    . RENAL ARTERY STENT  1999   bil RAS so ? bil vs unilateral stents  . RETINAL LASER PROCEDURE Right 2012   "bleeding"  . TONSILLECTOMY    . TOTAL ABDOMINAL HYSTERECTOMY       Prescriptions Prior to Admission  Medication Sig Dispense Refill Last Dose  . acyclovir (ZOVIRAX) 400 MG tablet TAKE 1 TABLET (400 MG TOTAL) BY MOUTH 2 (TWO)  TIMES DAILY. 60 tablet 2 01/21/2017 at am  . amLODipine (NORVASC) 10 MG tablet Take 10 mg by mouth at bedtime.   01/20/2017 at Unknown time  . Ascorbic Acid (VITAMIN C) 1000 MG tablet Take 1,000 mg by mouth daily.    01/21/2017 at Unknown time  . aspirin EC 81 MG tablet Take 81 mg by mouth daily.   01/21/2017 at Unknown time  . calcium carbonate (OS-CAL - DOSED IN MG OF ELEMENTAL CALCIUM) 1250 (500 Ca) MG tablet Take 1 tablet by mouth daily with breakfast.   01/21/2017 at Unknown time  . Cholecalciferol (VITAMIN D PO) Take 1 capsule by mouth daily.   01/21/2017 at Unknown time  . DULoxetine (CYMBALTA) 20 MG capsule Take 1 capsule (20 mg total) by mouth daily. 30 capsule 0 01/21/2017 at Unknown time  . feeding supplement, ENSURE ENLIVE, (ENSURE ENLIVE) LIQD Take 237 mLs by mouth 2 (two) times daily between meals. (Patient taking differently: Take 237 mLs by mouth See admin instructions. Two to three times a day) 237 mL 30 01/21/2017 at Unknown time  . metoprolol tartrate (LOPRESSOR) 25 MG tablet Take 0.5 tablets (12.5 mg total) by mouth 2 (two) times daily. 30 tablet 3 01/21/2017 at am  . midodrine (PROAMATINE) 2.5 MG tablet Take 1 tablet (2.5 mg total) by mouth 2 (two) times daily with a meal. 30 tablet 0 01/21/2017 at am  . mirtazapine (REMERON) 15 MG tablet Take 1 tablet (15 mg total) by mouth at bedtime. 30 tablet 3 01/20/2017 at Unknown time  . pantoprazole (PROTONIX) 40 MG tablet Take 1 tablet (40 mg total) by mouth 2 (two) times daily before a meal. 60 tablet 0 01/21/2017 at am  . polyethylene glycol (MIRALAX / GLYCOLAX) packet Take 17 g by mouth daily as needed for moderate constipation.     at prn  . potassium chloride (K-DUR,KLOR-CON) 10 MEQ tablet Take 1 tablet (10 mEq total) by mouth daily. 30 tablet 1 01/21/2017 at Unknown time  . pravastatin (PRAVACHOL) 40 MG tablet Take 40 mg by mouth every evening.  1 01/20/2017 at Unknown time  . ticagrelor (BRILINTA) 90 MG TABS tablet Take 1 tablet (90 mg total) by mouth 2 (two)  times daily. 60 tablet 3 01/21/2017 at am    Inpatient Medications:  . [MAR Hold] aspirin EC  81 mg Oral Daily  . [MAR Hold] calcium carbonate  1 tablet Oral Q breakfast  . [MAR Hold] cholecalciferol  1,000 Units Oral Daily  . [MAR Hold] DULoxetine  20 mg Oral Daily  . [MAR Hold] feeding supplement (ENSURE ENLIVE)  237 mL Oral QID  . [MAR Hold] metoprolol tartrate  12.5 mg Oral BID  . [MAR Hold] mirtazapine  15 mg Oral QHS  . [MAR Hold] multivitamin with minerals  1 tablet Oral Daily  . [MAR Hold] pantoprazole  40 mg Oral Daily  . [MAR Hold] potassium chloride  10 mEq Oral Daily  . [  MAR Hold] pravastatin  40 mg Oral QPM  . sodium chloride flush  3 mL Intravenous Q12H  . [MAR Hold] ticagrelor  90 mg Oral BID  . [MAR Hold] vitamin C  1,000 mg Oral Daily    Allergies:  Allergies  Allergen Reactions  . Sulfa Antibiotics Rash    Social History   Social History  . Marital status: Single    Spouse name: N/A  . Number of children: N/A  . Years of education: N/A   Occupational History  . Not on file.   Social History Main Topics  . Smoking status: Current Every Day Smoker    Packs/day: 0.50    Years: 50.00    Types: Cigarettes  . Smokeless tobacco: Never Used  . Alcohol use No  . Drug use: No  . Sexual activity: No   Other Topics Concern  . Not on file   Social History Narrative  . No narrative on file     Family History  Problem Relation Age of Onset  . Colon cancer Neg Hx      Review of Systems: All other systems reviewed and are otherwise negative except as noted above.  Physical Exam: Vitals:   01/23/17 1030 01/23/17 1035 01/23/17 1040 01/23/17 1045  BP: 140/74 134/80    Pulse: (!) 56 60 (!) 0 (!) 0  Resp: (!) 7 11 (!) 0 (!) 0  Temp:      TempSrc:      SpO2: 98% 100% (!) 0% (!) 0%  Weight:      Height:        GEN- The patient is thin, frail in appearance, in NAD, alert and oriented x 3 today.   HEENT: normocephalic, atraumatic; sclera clear,  conjunctiva pink; hearing intact; oropharynx clear; neck supple, no JVP Lymph- no cervical lymphadenopathy Lungs- CTA b/l, normal work of breathing.  No wheezes, rales, rhonchi Heart- RRR, no murmurs, rubs or gallops, PMI not laterally displaced GI- soft, non-tender, non-distended Extremities- no clubbing, cyanosis, or edema MS- no significant deformity age appropriate atrophy Skin- warm and dry, no rash or lesion Psych- euthymic mood, full affect Neuro- no gross deficits observed  Labs:   Lab Results  Component Value Date   WBC 4.6 01/23/2017   HGB 10.6 (L) 01/23/2017   HCT 33.4 (L) 01/23/2017   MCV 94.6 01/23/2017   PLT 174 01/23/2017    Recent Labs Lab 01/21/17 1849  01/23/17 0030  NA 135  < > 143  K 4.8  < > 4.0  CL 101  < > 110  CO2 21*  < > 21*  BUN 39*  < > 23*  CREATININE 2.37*  < > 1.50*  CALCIUM 9.9  < > 9.2  PROT 6.4*  --   --   BILITOT 1.0  --   --   ALKPHOS 50  --   --   ALT 24  --   --   AST 39  --   --   GLUCOSE 101*  < > 89  < > = values in this interval not displayed.    Radiology/Studies:  Portable Chest 1 View Result Date: 01/21/2017 CLINICAL DATA:  Shortness of breath EXAM: PORTABLE CHEST 1 VIEW COMPARISON:  12/20/2016 FINDINGS: The heart size and mediastinal contours are within normal limits. Both lungs are clear. The visualized skeletal structures are unremarkable. IMPRESSION: No active disease. Electronically Signed   By: Donavan Foil M.D.   On: 01/21/2017 19:07    Reviewed  by myself: EKG: #1 SR, 66bpm, PR 118m, QRS 734m QTc 48685m2 SB, 59bpm, PR 142m42mRS 76ms19mc 455ms 74mMETRY: SR/SB, brief PAT's 130-160, very frequent PAC's, at times bigeminal intermittently PACs are not conducted with V rates 30's  Telemetry was also reviewed from the summaries 12/06/16 and 12/22/16. Both of these show short runs of atrial tachycardia and some bradycardia.  12/07/16: TTE Study Conclusions - Left ventricle: The cavity size was normal. There was  mild   concentric hypertrophy. Systolic function was normal. The   estimated ejection fraction was in the range of 55% to 60%. There   was no dynamic obstruction. Wall motion was normal; there were no   regional wall motion abnormalities. Doppler parameters are   consistent with abnormal left ventricular relaxation (grade 1   diastolic dysfunction). - Atrial septum: No defect or patent foramen ovale was identified. - Pulmonary arteries: Systolic pressure was mildly to moderately   increased. PA peak pressure: 37 mm Hg (S).   12/29/16: PCI Conclusion   Ost LM lesion, 30 %stenosed.  Prox LAD lesion, 20 %stenosed.  A STENT XIENCE ALPINE RX 2.25X8 drug eluting stent was successfully placed.  Ost 2nd Diag lesion, 95 %stenosed.  Post intervention, there is a 20% residual stenosis.  A STENT XIENCE ALPINE RX 3.0X23 drug eluting stent was successfully placed.  Mid LAD lesion, 80 %stenosed.  Post intervention, there is a 0% residual stenosis.   12/24/16: LHC Conclusion    Ost LM lesion, 20 %stenosed.  Mid Cx lesion, 30 %stenosed.  Prox Cx lesion, 30 %stenosed.  Mid LAD lesion, 70 %stenosed.  Ost 2nd Diag lesion, 95 %stenosed.  Dist LAD lesion, 70 %stenosed.  Prox RCA to Mid RCA lesion, 40 %stenosed.  Mid RCA lesion, 60 %stenosed.  Ost RPDA to RPDA lesion, 50 %stenosed.  Dist RCA lesion, 60 %stenosed.  Ost LAD to Mid LAD lesion, 30 %stenosed.  The left ventricular systolic function is normal.  LV end diastolic pressure is normal.  The left ventricular ejection fraction is 55-65% by visual estimate.     Assessment and Plan:   1. occ and brief PAT's, frequent PAC's, blocked PACs     + hx of palpitations, and dizzy spells, possibly one syncopal event     Uncertain if her dizziiness/syncope is entirely secondary to her brady periods given her poor intake and suspect dehydration component     She is on metoprolol 12.5 BID, her most recent BP's look like it could  tolerate up-titration, though came in with some degree of dehydration and lower numbers with anorexia and poor PO intake is an issue     Her baseline sinus rates are 50's-60's, would up-titrate her BB to her BP tolerance to try and suppress her atrial ectopy  Dr. Jenevie Casstevens Caryl Comese  2. CAD     s/p LHC without new interventions     c/w Dr. KadakiDoylene Canardned, Renee [name] 01/23/2017 10:48 AM  Coronary artery disease  Syncope  Atrial tachycardia  PACs-frequent with some block and functional bradycardia  Myeloma  Hypertension  Anorexia  Orthostatic hypotension      The patient's syncope I suspect based on the history and orthostatic vital signs recorded over the last month is likely orthostatic. She has had blood pressure falls of greater than 60 mm and nadir in the 70s. Given the fact that she has supine hypertension at least some of the time, Mestinon might be better than ProAmatine. I will make that  switch. I am not sure that her atrial tachycardia and/or her functional bradycardia are symptomatic. Hence, as the treatment for the former would prompt the need for backup bradycardia pacing to obviate the latter I would like to minimize interventions in this lady with myeloma and anorexia. Hence, I would recommend stopping the metoprolol and just following things along.  Myeloma. Things like amyloid can certainly be contributing to her orthostatic intolerance. Compressive clothing the further augment the benefits of Mestinon. We will have to be careful as Mestinon side effects including nausea and anorexia. As noted by R U-PA, her volume status is almost certainly contributing to the problem and pushing fluids will be key.

## 2017-01-23 NOTE — Telephone Encounter (Signed)
Medicaid reimbursement form faxed to Dr. Doylene Canard on 01/23/17 for determination of eligibility to attend Cardiac Rehab.

## 2017-01-23 NOTE — Significant Event (Signed)
Rapid Response Event Note  Overview:Called by RN for AMS Time Called: [id number] Time: 1323 Event Type: Neurologic  Initial Focused Assessment:  Called by RN for AMS, Upon my arrival to patients room, RN at bedside.  Patient lying in bed with nasal cannula on.  Patient is easily arouseable, alert and oriented, is slow to respond, no focal deficits.  Dr. Doylene Canard at bedside   Interventions:  No RRT interventions.    Plan of Care (if not transferred):  CT head ordered  Event Summary:  RN to call if assistance   at      at          [facility], [place]

## 2017-01-24 LAB — CBC
HCT: 31.8 % — ABNORMAL LOW (ref 36.0–46.0)
HEMOGLOBIN: 10.3 g/dL — AB (ref 12.0–15.0)
MCH: 30.4 pg (ref 26.0–34.0)
MCHC: 32.4 g/dL (ref 30.0–36.0)
MCV: 93.8 fL (ref 78.0–100.0)
Platelets: 186 10*3/uL (ref 150–400)
RBC: 3.39 MIL/uL — AB (ref 3.87–5.11)
RDW: 17.1 % — ABNORMAL HIGH (ref 11.5–15.5)
WBC: 4.3 10*3/uL (ref 4.0–10.5)

## 2017-01-24 LAB — BASIC METABOLIC PANEL
Anion gap: 14 (ref 5–15)
BUN: 13 mg/dL (ref 6–20)
CHLORIDE: 113 mmol/L — AB (ref 101–111)
CO2: 20 mmol/L — ABNORMAL LOW (ref 22–32)
Calcium: 8.9 mg/dL (ref 8.9–10.3)
Creatinine, Ser: 0.98 mg/dL (ref 0.44–1.00)
GFR calc Af Amer: >60 mL/min (ref >60)
GFR calc non Af Amer: 57 mL/min — ABNORMAL LOW (ref >60)
GLUCOSE: 77 mg/dL (ref 65–99)
POTASSIUM: 3.8 mmol/L (ref 3.5–5.1)
SODIUM: 147 mmol/L — AB (ref 135–145)

## 2017-01-24 NOTE — Progress Notes (Signed)
Ref: Philis Fendt, MD   Subjective:  Feeling better. No chest pain. Patent LAD and diagonal stents on cardiac catheterization yesterday. CT brain negative for acute bleed or stroke. Patient alert and oriented post CT and today. Confusion probably due to fentanyl use although it was very small dose.  Objective:  Vital Signs in the last 24 hours: Temp:  [98 F (36.7 C)-99.2 F (37.3 C)] 98.7 F (37.1 C) (04/07 0913) Pulse Rate:  [49-100] 88 (04/07 0913) Cardiac Rhythm: Supraventricular tachycardia;Other (Comment) (04/07 0904) Resp:  [9-18] 18 (04/07 0913) BP: (106-139)/(65-127) 118/82 (04/07 0913) SpO2:  [88 %-100 %] 100 % (04/07 0913) Weight:  [59.4 kg (131 lb)] 59.4 kg (131 lb) (04/07 0358)  Physical Exam: BP Readings from Last 1 Encounters:  01/24/17 118/82    Wt Readings from Last 1 Encounters:  01/24/17 59.4 kg (131 lb)    Weight change: 0 kg (0 lb) Body mass index is 21.8 kg/m. HEENT: [place]/AT, Eyes-Brown, PERL, EOMI, Conjunctiva-Pale pink, Sclera-Non-icteric Neck: No JVD, No bruit, Trachea midline. Lungs:  Clear, Bilateral. Cardiac:  Regular rhythm, normal S1 and S2, no S3. II/VI systolic murmur. Abdomen:  Soft, non-tender. BS present. Extremities:  No edema present. No cyanosis. No clubbing. No groin hematoma. CNS: AxOx3, Cranial nerves grossly intact, moves all 4 extremities.  Skin: Warm and dry.   Intake/Output from previous day: 04/06 0701 - 04/07 0700 In: 343 [P.O.:340; I.V.:3] Out: -     Lab Results: BMET    Component Value Date/Time   NA 147 (H) 01/24/2017 0418   NA 143 01/23/2017 0030   NA 141 01/22/2017 0500   NA 140 12/18/2016 0932   NA 140 12/11/2016 1437   NA 139 12/11/202018 1145   K 3.8 01/24/2017 0418   K 4.0 01/23/2017 0030   K 4.1 01/22/2017 0500   K 4.9 12/18/2016 0932   K 4.2 12/11/2016 1437   K 3.9 12/11/202018 1145   CL 113 (H) 01/24/2017 0418   CL 110 01/23/2017 0030   CL 109 01/22/2017 0500   CO2 20 (L) 01/24/2017 0418   CO2 21  (L) 01/23/2017 0030   CO2 20 (L) 01/22/2017 0500   CO2 24 12/18/2016 0932   CO2 22 12/11/2016 1437   CO2 21 (L) 12/11/202018 1145   GLUCOSE 77 01/24/2017 0418   GLUCOSE 89 01/23/2017 0030   GLUCOSE 87 01/22/2017 0500   GLUCOSE 138 12/18/2016 0932   GLUCOSE 119 12/11/2016 1437   GLUCOSE 123 12/11/202018 1145   BUN 13 01/24/2017 0418   BUN 23 (H) 01/23/2017 0030   BUN 34 (H) 01/22/2017 0500   BUN 16.8 12/18/2016 0932   BUN 17.6 12/11/2016 1437   BUN 13.6 12/11/202018 1145   CREATININE 0.98 01/24/2017 0418   CREATININE 1.50 (H) 01/23/2017 0030   CREATININE 2.02 (H) 01/22/2017 0500   CREATININE 1.7 (H) 12/18/2016 0932   CREATININE 1.6 (H) 12/11/2016 1437   CREATININE 1.2 (H) 12/11/202018 1145   CALCIUM 8.9 01/24/2017 0418   CALCIUM 9.2 01/23/2017 0030   CALCIUM 9.4 01/22/2017 0500   CALCIUM 9.9 12/18/2016 0932   CALCIUM 10.3 12/11/2016 1437   CALCIUM 9.9 12/11/202018 1145   GFRNONAA 57 (L) 01/24/2017 0418   GFRNONAA 34 (L) 01/23/2017 0030   GFRNONAA 24 (L) 01/22/2017 0500   GFRAA >60 01/24/2017 0418   GFRAA 40 (L) 01/23/2017 0030   GFRAA 28 (L) 01/22/2017 0500   CBC    Component Value Date/Time   WBC 4.3 01/24/2017 0418  RBC 3.39 (L) 01/24/2017 0418   HGB 10.3 (L) 01/24/2017 0418   HGB 14.0 12/18/2016 0932   HCT 31.8 (L) 01/24/2017 0418   HCT 42.8 12/18/2016 0932   PLT 186 01/24/2017 0418   PLT 221 12/18/2016 0932   MCV 93.8 01/24/2017 0418   MCV 92.0 12/18/2016 0932   MCH 30.4 01/24/2017 0418   MCHC 32.4 01/24/2017 0418   RDW 17.1 (H) 01/24/2017 0418   RDW 17.6 (H) 12/18/2016 0932   LYMPHSABS 1.3 01/21/2017 1849   LYMPHSABS 1.2 12/18/2016 0932   MONOABS 0.9 01/21/2017 1849   MONOABS 0.7 12/18/2016 0932   EOSABS 0.1 01/21/2017 1849   EOSABS 0.1 12/18/2016 0932   BASOSABS 0.0 01/21/2017 1849   BASOSABS 0.1 12/18/2016 0932   HEPATIC Function Panel  Recent Labs  12/18/16 0932 12/22/16 2008 01/21/17 1849  PROT 6.4 6.2* 6.4*   HEMOGLOBIN A1C No components found  for: HGA1C,  MPG CARDIAC ENZYMES Lab Results  Component Value Date   CKTOTAL 129 01/01/2012   CKMB 4.6 (H) 01/01/2012   TROPONINI 0.08 (HH) 01/22/2017   TROPONINI 0.08 (HH) 01/22/2017   TROPONINI 0.08 (HH) 01/21/2017   BNP No results for input(s): PROBNP in the last 8760 hours. TSH  Recent Labs  12/06/16 2005 12/18/16 1056 12/22/16 2008  TSH 0.541 0.576 0.481   CHOLESTEROL  Recent Labs  12/07/16 0605  CHOL 220*    Scheduled Meds: . aspirin EC  81 mg Oral Daily  . calcium carbonate  1 tablet Oral Q breakfast  . cholecalciferol  1,000 Units Oral Daily  . DULoxetine  20 mg Oral Daily  . feeding supplement (ENSURE ENLIVE)  237 mL Oral QID  . mirtazapine  15 mg Oral QHS  . multivitamin with minerals  1 tablet Oral Daily  . pantoprazole  40 mg Oral Daily  . potassium chloride  10 mEq Oral Daily  . pravastatin  40 mg Oral QPM  . pyridostigmine  30 mg Oral BID  . sodium chloride flush  3 mL Intravenous Q12H  . ticagrelor  90 mg Oral BID  . vitamin C  1,000 mg Oral Daily   Continuous Infusions: PRN Meds:.ondansetron (ZOFRAN) IV, sodium chloride flush  Assessment/Plan: Dehydration-improved CAD S/P LAD and diagonal vessel stent Anemia of blood loss CKD, III- resolving Multiple myeloma Tachy-brady syndrome GERD Abnormal weight loss Protein calorie malnutrition  Continue medical treatment. Appreciate EP consult.   LOS: 2 days    [name]  MD  01/24/2017, 11:41 AM

## 2017-01-25 MED ORDER — PYRIDOSTIGMINE BROMIDE 60 MG PO TABS: 30.0000 mg | ORAL_TABLET | Freq: Two times a day (BID) | ORAL | 3 refills | Status: DC

## 2017-01-25 NOTE — Discharge Summary (Signed)
Physician Discharge Summary  Patient ID: Abigail [name] MRN: [id number] DOB/AGE: 1946-05-25 71 y.o.  Admit date: 01/21/2017 Discharge date: 01/25/2017  Admission Diagnoses: Dehydration Chest pain CAD LAD and diagonal arteries stent Unsteady gait Anemia of blood loss Multiple myeloma GERD Abnormal weight loss CKD, II Protein calorie malnutrition  Discharge Diagnoses:  Principal Problem:   Dehydration Active Problems:   Chest pain   Malnutrition of moderate degree   Multiple myeloma (HCC)   Abdominal pain   Nausea and vomiting in adult patient   Acute coronary syndrome ([place])   CAD   LAD and Diagonal arteries stent   Anemia of blood loss   Postural hypotension   Tachy-brady syndrome   GERD   Abnormal weight loss  Discharged Condition: fair  Hospital Course: 71 year old female with multiple myeloma, CAD, Anemia had weakness, dizziness and decreased appetite. She had recurrent chest pain and elevated Troponin-I. She underwent cardiac catheterization after IV saline for 48 hours and return of renal function to near normal. LAD and diagonal stents were patent. She had episodes of confusion post catheterization and was alert after CT of head was done.  She also had tachy-brady syndrome, which improved with discontinuation of B-blocker per EP doctor's advise. Her postural hypotension responded to Mestinon in favor of midodrine use and discontinuation of amlodipine.  Patient tolerated liquid calorie supplements and agrees to eat small meals several times a day. Her chemotherapy for multiple myeloma is on hold for several weeks and she will see her oncologist as arranged. She will see me and primary care in 1 week.  Consults: cardiology  Significant Diagnostic Studies: labs: Normal WBC and platelets counts, hgb low at 10.8. Electrolytes are normal, BUN 39 and creatinine was 2.37, post hydration Creatinine was 0.98. Troponin-I was 0.08 x 3.  EKG-SR with Atrial Premature Complexes.  Telemetry had episodes of asymptomatic tachy-brady syndrome.   Cardiac catheterization showed patent LAD and Diagonal vessel with moderate multivessel atherosclerosis.  CT head was negative for acute stroke.  Treatments: cardiac meds: Pravastatin, Ticagrelor, aspirin and potassium.  Discharge Exam: Blood pressure 128/69, pulse 76, temperature 98.5 F (36.9 C), temperature source Oral, resp. rate 15, height _0  (1.651 m), weight 59.4 kg (131 lb), SpO2 99 %. General appearance: alert, cooperative and appears stated age. Head: Normocephalic, atraumatic. Eyes: Brown eyes, Pale pink conjunctiva, corneas clear. PERRL, EOM's intact.  Neck: No adenopathy, no carotid bruit, no JVD, supple, symmetrical, trachea midline and thyroid not enlarged. Resp: Clear to auscultation bilaterally. Cardio: Regular rate and rhythm, S1, S2 normal, II/VI systolic murmur, no click, rub or gallop. GI: Soft, non-tender; bowel sounds normal; no organomegaly. Extremities: No edema, cyanosis or clubbing. Skin: Warm and dry.  Neurologic: Alert and oriented X 3, normal strength and tone. Normal coordination and slow gait. Psychiatry: flat affect. Negative suicidal ideation.  Disposition: 03-Skilled Nursing Facility   Allergies as of 01/25/2017      Reactions   Sulfa Antibiotics Rash      Medication List    STOP taking these medications   amLODipine 10 MG tablet Commonly known as:  NORVASC   metoprolol tartrate 25 MG tablet Commonly known as:  LOPRESSOR   midodrine 2.5 MG tablet Commonly known as:  PROAMATINE     TAKE these medications   acyclovir 400 MG tablet Commonly known as:  ZOVIRAX TAKE 1 TABLET (400 MG TOTAL) BY MOUTH 2 (TWO) TIMES DAILY.   aspirin EC 81 MG tablet Take 81 mg by mouth daily.  calcium carbonate 1250 (500 Ca) MG tablet Commonly known as:  OS-CAL - dosed in mg of elemental calcium Take 1 tablet by mouth daily with breakfast.   DULoxetine 20 MG capsule Commonly known as:   CYMBALTA Take 1 capsule (20 mg total) by mouth daily.   feeding supplement (ENSURE ENLIVE) Liqd Take 237 mLs by mouth 2 (two) times daily between meals. What changed:  when to take this  additional instructions   mirtazapine 15 MG tablet Commonly known as:  REMERON Take 1 tablet (15 mg total) by mouth at bedtime.   pantoprazole 40 MG tablet Commonly known as:  PROTONIX Take 1 tablet (40 mg total) by mouth 2 (two) times daily before a meal.   polyethylene glycol packet Commonly known as:  MIRALAX / GLYCOLAX Take 17 g by mouth daily as needed for moderate constipation.   potassium chloride 10 MEQ tablet Commonly known as:  K-DUR,KLOR-CON Take 1 tablet (10 mEq total) by mouth daily.   pravastatin 40 MG tablet Commonly known as:  PRAVACHOL Take 40 mg by mouth every evening.   pyridostigmine 60 MG tablet Commonly known as:  MESTINON Take 0.5 tablets (30 mg total) by mouth 2 (two) times daily.   ticagrelor 90 MG Tabs tablet Commonly known as:  BRILINTA Take 1 tablet (90 mg total) by mouth 2 (two) times daily.   vitamin C 1000 MG tablet Take 1,000 mg by mouth daily.   VITAMIN D PO Take 1 capsule by mouth daily.      Follow-up Information    Philis Fendt, MD. Schedule an appointment as soon as possible for a visit in 1 week(s).   Specialty:  Internal Medicine Contact information: [place] [address] [phone]        Birdie Riddle, MD. Schedule an appointment as soon as possible for a visit in 1 week(s).   Specialty:  Cardiology Contact information: [place] [address] [phone]           Signed: Birdie Riddle 01/25/2017, 10:15 AM

## 2017-01-25 NOTE — Care Management Note (Signed)
Case Management Note  Patient Details  Name: ROLENA KNUTSON MRN: [id number] Date of Birth: 1946-03-03  Subjective/Objective:  71 y.o. F who was active with [facility] prior to admission for [place], [place], [place] and RN services. These will be resumed at discharge.                   Action/Plan:CM will sign off for now but will be available should additional discharge needs arise or disposition change.    Expected Discharge Date:  01/25/17               Expected Discharge Plan:  [place]  In-House Referral:  NA  Discharge planning Services  CM Consult  Post Acute Care Choice:  Resumption of Svcs/PTA Provider Choice offered to:  Patient  DME Arranged:  N/A DME Agency:  NA  HH Arranged:  RN, PT, OT, Nurse's Aide [place] Agency:  [facility]  Status of Service:  Completed, signed off  If discussed at [place] of Stay Meetings, dates discussed:    Additional Comments:  Delrae Sawyers, RN 01/25/2017, 1:10 PM

## 2017-01-29 ENCOUNTER — Ambulatory Visit (HOSPITAL_BASED_OUTPATIENT_CLINIC_OR_DEPARTMENT_OTHER): Payer: Medicare HMO | Admitting: Nurse Practitioner

## 2017-01-29 ENCOUNTER — Other Ambulatory Visit (HOSPITAL_BASED_OUTPATIENT_CLINIC_OR_DEPARTMENT_OTHER): Payer: Medicare HMO

## 2017-01-29 ENCOUNTER — Telehealth: Payer: Self-pay | Admitting: Nurse Practitioner

## 2017-01-29 VITALS — BP 94/61 | HR 71 | Temp 98.4°F | Resp 20 | Ht 65.0 in | Wt 132.3 lb

## 2017-01-29 DIAGNOSIS — D630 Anemia in neoplastic disease: Secondary | ICD-10-CM

## 2017-01-29 DIAGNOSIS — R0609 Other forms of dyspnea: Secondary | ICD-10-CM | POA: Diagnosis not present

## 2017-01-29 DIAGNOSIS — C9000 Multiple myeloma not having achieved remission: Secondary | ICD-10-CM

## 2017-01-29 LAB — COMPREHENSIVE METABOLIC PANEL
ALBUMIN: 3.5 g/dL (ref 3.5–5.0)
ALK PHOS: 52 U/L (ref 40–150)
ALT: 16 U/L (ref 0–55)
AST: 24 U/L (ref 5–34)
Anion Gap: 10 mEq/L (ref 3–11)
BILIRUBIN TOTAL: 0.55 mg/dL (ref 0.20–1.20)
BUN: 19.1 mg/dL (ref 7.0–26.0)
CALCIUM: 9.2 mg/dL (ref 8.4–10.4)
CO2: 25 mEq/L (ref 22–29)
CREATININE: 0.9 mg/dL (ref 0.6–1.1)
Chloride: 110 mEq/L — ABNORMAL HIGH (ref 98–109)
EGFR: 74 mL/min/{1.73_m2} — ABNORMAL LOW (ref >90)
Glucose: 127 mg/dl (ref 70–140)
POTASSIUM: 3.7 meq/L (ref 3.5–5.1)
Sodium: 145 mEq/L (ref 136–145)
TOTAL PROTEIN: 5.7 g/dL — AB (ref 6.4–8.3)

## 2017-01-29 LAB — CBC WITH DIFFERENTIAL/PLATELET
BASO%: 0.8 % (ref 0.0–2.0)
BASOS ABS: 0 10*3/uL (ref 0.0–0.1)
EOS%: 2.7 % (ref 0.0–7.0)
Eosinophils Absolute: 0.1 10*3/uL (ref 0.0–0.5)
HEMATOCRIT: 31.7 % — AB (ref 34.8–46.6)
HEMOGLOBIN: 10.3 g/dL — AB (ref 11.6–15.9)
LYMPH%: 20.1 % (ref 14.0–49.7)
MCH: 30.6 pg (ref 25.1–34.0)
MCHC: 32.6 g/dL (ref 31.5–36.0)
MCV: 94 fL (ref 79.5–101.0)
MONO#: 0.6 10*3/uL (ref 0.1–0.9)
MONO%: 13.8 % (ref 0.0–14.0)
NEUT%: 62.6 % (ref 38.4–76.8)
NEUTROS ABS: 2.9 10*3/uL (ref 1.5–6.5)
Platelets: 283 10*3/uL (ref 145–400)
RBC: 3.37 10*6/uL — ABNORMAL LOW (ref 3.70–5.45)
RDW: 17.3 % — ABNORMAL HIGH (ref 11.2–14.5)
WBC: 4.6 10*3/uL (ref 3.9–10.3)
lymph#: 0.9 10*3/uL (ref 0.9–3.3)

## 2017-01-29 NOTE — Progress Notes (Signed)
  [facility] OFFICE PROGRESS NOTE   Diagnosis:  Multiple myeloma  INTERVAL HISTORY:   Abigail [name] returns as scheduled. Treatment for the myeloma is currently on hold. She was hospitalized for 01/21/2017 through 01/25/2017 presenting with shortness of breath, weakness, dizziness and chest pain. She underwent cardiac catheterization with LAD and diagonal stents patent.  She continues to have dyspnea on exertion. No chest pain. She feels she is some stronger. Energy level is better. Appetite remains poor. She reports beginning an appetite stimulant.  Objective:  Vital signs in last 24 hours:  Blood pressure 94/61, pulse 71, temperature 98.4 F (36.9 C), temperature source Oral, resp. rate 20, height '5\' 5"'$  (1.651 m), weight 132 lb 4.8 oz (60 kg), SpO2 100 %.    HEENT: No thrush or ulcers. Resp: Lungs clear bilaterally. Cardio: Regular rate and rhythm. GI: Abdomen soft and nontender. No hepatomegaly. Vascular: No leg edema. Neuro: Alert and oriented.  Skin: No rash.    Lab Results:  Lab Results  Component Value Date   WBC 4.6 01/29/2017   HGB 10.3 (L) 01/29/2017   HCT 31.7 (L) 01/29/2017   MCV 94.0 01/29/2017   PLT 283 01/29/2017   NEUTROABS 2.9 01/29/2017    Imaging:  No results found.  Medications: I have reviewed the patient's current medications.  Assessment/Plan: 1. Multiple myeloma, IgG lambda, bone marrow biopsy 06/15/2016 confirmed multiple myeloma; cytogenetics by FISHshow +11, +12, 13q-  Elevated serum free lambda light chains  Lambda light chain proteinuria  Lytic bone lesions on a bone survey 06/13/2016  Initiation of weekly Velcade/Decadron 06/19/2016  Serum light chains improved 07/31/2016  Initiation of Revlimid 11/03/20172 weeks on/2 weeks off  Serum M spike and IgG significantly improved 09/25/2016  Velcade held on 10/30/2016 due to urinary retention  2. Severe anemia secondary to #1-markedly improved  3. Diffuse lytic  bone lesions secondary to multiple myeloma, status post Zometa 09/18/2016(plan to continue every 3 months)  4. History of coronary artery disease/myocardial infarction  5. History of colon polyps  6. Bilateral leg edema 09/04/2016. Negative bilateral venous Doppler 09/05/2016.  7. Mild periorbital edema 09/18/2016, question related to early stye formation, question Velcade related chalazia.Improved.  8. CT scan 10/27/2016 with a new right kidney massevaluated by urology; MRI abdomen 11/18/2016 multiple bilateral renal cysts. Most of these are simple cysts. The 2 lesions in question on the CT scan are both hemorrhagic or proteinaceous cysts. No worrisome contrast enhancement.  9. Urinary retention 10/28/2016 status post evaluation in the emergency department. Urinalysis negative for signs of infection 10/30/2016. Velcade held. Resolved.  10. Abdominal pain, nausea, vomiting.Etiology unclear. Upper endoscopy 11/18/2016 with findings of reflux esophagitis, benign appearing esophageal stenosis, small hiatal hernia and erythematous duodenopathy. CT abdomen/pelvis 11/19/2016 with no acute abnormality.  11. Multivessel CAD status post LAD and diagonal vessel stent placement    Disposition: Abigail [name] appears unchanged. We will follow-up on the myeloma labs from today and decide on resuming treatment. She will return for a follow-up visit in 2 weeks.  Plan reviewed with Dr. Benay Spice.    [name] ANP/GNP-BC   01/29/2017  3:30 PM

## 2017-01-29 NOTE — Telephone Encounter (Signed)
Gave patient AVS and calender per 4/12 los.  

## 2017-01-30 LAB — IGG: IGG (IMMUNOGLOBIN G), SERUM: 364 mg/dL — AB (ref 700–1600)

## 2017-01-30 LAB — KAPPA/LAMBDA LIGHT CHAINS
Ig Kappa Free Light Chain: 5.3 mg/L (ref 3.3–19.4)
Ig Lambda Free Light Chain: 6.6 mg/L (ref 5.7–26.3)
Kappa/Lambda FluidC Ratio: 0.8 (ref 0.26–1.65)

## 2017-02-02 LAB — PROTEIN ELECTROPHORESIS, SERUM
A/G RATIO SPE: 1.7 (ref 0.7–1.7)
Albumin: 3.3 g/dL (ref 2.9–4.4)
Alpha 1: 0.3 g/dL (ref 0.0–0.4)
Alpha 2: 0.7 g/dL (ref 0.4–1.0)
BETA: 0.8 g/dL (ref 0.7–1.3)
Gamma Globulin: 0.3 g/dL — ABNORMAL LOW (ref 0.4–1.8)
Globulin, Total: 2 g/dL — ABNORMAL LOW (ref 2.2–3.9)
M-Spike, %: 0.1 g/dL — ABNORMAL HIGH
Total Protein: 5.3 g/dL — ABNORMAL LOW (ref 6.0–8.5)

## 2017-02-12 ENCOUNTER — Other Ambulatory Visit (HOSPITAL_BASED_OUTPATIENT_CLINIC_OR_DEPARTMENT_OTHER): Payer: Medicare HMO

## 2017-02-12 ENCOUNTER — Telehealth: Payer: Self-pay | Admitting: Oncology

## 2017-02-12 ENCOUNTER — Ambulatory Visit (HOSPITAL_BASED_OUTPATIENT_CLINIC_OR_DEPARTMENT_OTHER): Payer: Medicare HMO | Admitting: Oncology

## 2017-02-12 VITALS — BP 108/54 | HR 89 | Temp 98.8°F | Resp 18 | Ht 65.0 in | Wt 131.8 lb

## 2017-02-12 DIAGNOSIS — C9000 Multiple myeloma not having achieved remission: Secondary | ICD-10-CM

## 2017-02-12 DIAGNOSIS — D630 Anemia in neoplastic disease: Secondary | ICD-10-CM | POA: Diagnosis not present

## 2017-02-12 LAB — CBC WITH DIFFERENTIAL/PLATELET
BASO%: 0.4 % (ref 0.0–2.0)
Basophils Absolute: 0 10*3/uL (ref 0.0–0.1)
EOS%: 2.2 % (ref 0.0–7.0)
Eosinophils Absolute: 0.1 10*3/uL (ref 0.0–0.5)
HCT: 31.9 % — ABNORMAL LOW (ref 34.8–46.6)
HGB: 10.1 g/dL — ABNORMAL LOW (ref 11.6–15.9)
LYMPH#: 1 10*3/uL (ref 0.9–3.3)
LYMPH%: 20.7 % (ref 14.0–49.7)
MCH: 30.3 pg (ref 25.1–34.0)
MCHC: 31.7 g/dL (ref 31.5–36.0)
MCV: 95.8 fL (ref 79.5–101.0)
MONO#: 0.9 10*3/uL (ref 0.1–0.9)
MONO%: 17.1 % — ABNORMAL HIGH (ref 0.0–14.0)
NEUT#: 3 10*3/uL (ref 1.5–6.5)
NEUT%: 59.6 % (ref 38.4–76.8)
Platelets: 258 10*3/uL (ref 145–400)
RBC: 3.33 10*6/uL — AB (ref 3.70–5.45)
RDW: 15.3 % — AB (ref 11.2–14.5)
WBC: 5 10*3/uL (ref 3.9–10.3)
nRBC: 0 % (ref 0–0)

## 2017-02-12 LAB — BASIC METABOLIC PANEL
Anion Gap: 11 mEq/L (ref 3–11)
BUN: 20.6 mg/dL (ref 7.0–26.0)
CALCIUM: 9.3 mg/dL (ref 8.4–10.4)
CO2: 23 mEq/L (ref 22–29)
CREATININE: 1 mg/dL (ref 0.6–1.1)
Chloride: 109 mEq/L (ref 98–109)
EGFR: 65 mL/min/{1.73_m2} — ABNORMAL LOW (ref >90)
Glucose: 106 mg/dl (ref 70–140)
Potassium: 3.8 mEq/L (ref 3.5–5.1)
SODIUM: 143 meq/L (ref 136–145)

## 2017-02-12 MED ORDER — LENALIDOMIDE 15 MG PO CAPS: 15.0000 mg | ORAL_CAPSULE | Freq: Every day | ORAL | Status: DC

## 2017-02-12 MED ORDER — DEXAMETHASONE 4 MG PO TABS
20.0000 mg | ORAL_TABLET | ORAL | 1 refills | Status: DC
Start: 2017-02-12 — End: 2017-03-01

## 2017-02-12 NOTE — Patient Instructions (Signed)
Resume your Revlimid tonight. Take it once daily for 2 weeks then 2 weeks off. Take Decadron 20 mg (5 tablets) once weekly, every week.  Continue Acyclovir to prevent shingles.

## 2017-02-12 NOTE — Progress Notes (Signed)
  [facility] OFFICE PROGRESS NOTE   Diagnosis: Multiple myeloma  INTERVAL HISTORY:   Abigail [name] returns as scheduled. No chest pain, syncope, or dyspnea. She reports her appetite is improving. No new complaint. No difficulty with urination. Objective:  Vital signs in last 24 hours:  Blood pressure (!) 108/54, pulse 89, temperature 98.8 F (37.1 C), temperature source Oral, resp. rate 18, height _0  (1.651 m), weight 131 lb 12.8 oz (59.8 kg), SpO2 100 %.    HEENT: No thrush Resp: Lungs clear bilaterally Cardio: Regular rate and rhythm GI: No hepatosplenomegaly, nontender Vascular: No leg edema   Lab Results:  Lab Results  Component Value Date   WBC 5.0 02/12/2017   HGB 10.1 (L) 02/12/2017   HCT 31.9 (L) 02/12/2017   MCV 95.8 02/12/2017   PLT 258 02/12/2017   NEUTROABS 3.0 02/12/2017   12 2018-serum M spike 0.1, IgG 364, lambda light chains 6.6  Medications: I have reviewed the patient's current medications.  Assessment/Plan: 1. Multiple myeloma, IgG lambda, bone marrow biopsy 06/15/2016 confirmed multiple myeloma; cytogenetics by FISHshow +11, +12, 13q-  Elevated serum free lambda light chains  Lambda light chain proteinuria  Lytic bone lesions on a bone survey 06/13/2016  Initiation of weekly Velcade/Decadron 06/19/2016  Serum light chains improved 07/31/2016  Initiation of Revlimid 11/03/20172 weeks on/2 weeks off  Serum M spike and IgG significantly improved 09/25/2016  Velcade held on 10/30/2016 due to urinary retention  Treatment resumed with Revlimid (2 weeks on/2 weeks off) and weekly Decadron on 02/12/2017  2. Severe anemia secondary to #1-markedly improved  3. Diffuse lytic bone lesions secondary to multiple myeloma, status post Zometa 09/18/2016(plan to continue every 3 months)  4. History of coronary artery disease/myocardial infarction  5. History of colon polyps  6. Bilateral leg edema 09/04/2016. Negative  bilateral venous Doppler 09/05/2016.  7. Mild periorbital edema 09/18/2016, question related to early stye formation, question Velcade related chalazia.Improved.  8. CT scan 10/27/2016 with a new right kidney massevaluated by urology; MRI abdomen 11/18/2016 multiple bilateral renal cysts. Most of these are simple cysts. The 2 lesions in question on the CT scan are both hemorrhagic or proteinaceous cysts. No worrisome contrast enhancement.  9. Urinary retention 10/28/2016 status post evaluation in the emergency department. Urinalysis negative for signs of infection 10/30/2016. Velcade held. Resolved.  10. Abdominal pain, nausea, vomiting.Etiology unclear. Upper endoscopy 11/18/2016 with findings of reflux esophagitis, benign appearing esophageal stenosis, small hiatal hernia and erythematous duodenopathy. CT abdomen/pelvis 11/19/2016 with no acute abnormality.  11. Multivessel CAD status post LAD and diagonal vessel stent placement   Disposition:  Abigail [name] has an improved performance status. Her weight has stabilized. The myeloma markers remain improved. The plan is to resume Revlimid/Decadron today. She will return for an office and lab visit in 2 weeks.  Mestinon is on her medication list. She is not aware of a diagnosis associated with this medication. We will clarify this with Dr. Jeanie Cooks.  The plan is to complete 2 additional cycles of Revlimid/Decadron and then switch to maintenance Revlimid.  25 minutes were spent with the patient today. The majority of the time was used for counseling and coordination of care.  [name], MD  02/12/2017  11:29 AM

## 2017-02-12 NOTE — Telephone Encounter (Signed)
Appointments scheduled per 02/12/17 los. Patient was given a copy of the AVS report and appointment schedule per 02/12/17 los. °

## 2017-02-12 NOTE — Addendum Note (Signed)
Addended by: Rosalio Macadamia C on: 02/12/2017 12:16 PM   Modules accepted: Orders

## 2017-02-24 ENCOUNTER — Inpatient Hospital Stay (HOSPITAL_COMMUNITY)
Admission: EM | Admit: 2017-02-24 | Discharge: 2017-03-01 | DRG: 246 | Disposition: A | Discharge status: Home / Self Care | Payer: Medicare HMO | Attending: Cardiology | Admitting: Cardiology

## 2017-02-24 ENCOUNTER — Encounter (HOSPITAL_COMMUNITY): Payer: Self-pay

## 2017-02-24 ENCOUNTER — Inpatient Hospital Stay (HOSPITAL_COMMUNITY)
Admission: EM | Disposition: A | Discharge status: Home / Self Care | Payer: Self-pay | Source: Home / Self Care | Attending: Cardiology

## 2017-02-24 DIAGNOSIS — I2510 Atherosclerotic heart disease of native coronary artery without angina pectoris: Secondary | ICD-10-CM | POA: Diagnosis present

## 2017-02-24 DIAGNOSIS — I213 ST elevation (STEMI) myocardial infarction of unspecified site: Secondary | ICD-10-CM | POA: Diagnosis present

## 2017-02-24 DIAGNOSIS — D638 Anemia in other chronic diseases classified elsewhere: Secondary | ICD-10-CM | POA: Diagnosis present

## 2017-02-24 DIAGNOSIS — Z8711 Personal history of peptic ulcer disease: Secondary | ICD-10-CM

## 2017-02-24 DIAGNOSIS — J9811 Atelectasis: Secondary | ICD-10-CM | POA: Diagnosis not present

## 2017-02-24 DIAGNOSIS — D6859 Other primary thrombophilia: Secondary | ICD-10-CM | POA: Diagnosis present

## 2017-02-24 DIAGNOSIS — D126 Benign neoplasm of colon, unspecified: Secondary | ICD-10-CM | POA: Diagnosis present

## 2017-02-24 DIAGNOSIS — Z9841 Cataract extraction status, right eye: Secondary | ICD-10-CM

## 2017-02-24 DIAGNOSIS — I129 Hypertensive chronic kidney disease with stage 1 through stage 4 chronic kidney disease, or unspecified chronic kidney disease: Secondary | ICD-10-CM | POA: Diagnosis present

## 2017-02-24 DIAGNOSIS — I2109 ST elevation (STEMI) myocardial infarction involving other coronary artery of anterior wall: Principal | ICD-10-CM | POA: Diagnosis present

## 2017-02-24 DIAGNOSIS — Z7952 Long term (current) use of systemic steroids: Secondary | ICD-10-CM

## 2017-02-24 DIAGNOSIS — N183 Chronic kidney disease, stage 3 (moderate): Secondary | ICD-10-CM | POA: Diagnosis present

## 2017-02-24 DIAGNOSIS — Z7902 Long term (current) use of antithrombotics/antiplatelets: Secondary | ICD-10-CM

## 2017-02-24 DIAGNOSIS — I471 Supraventricular tachycardia: Secondary | ICD-10-CM | POA: Diagnosis present

## 2017-02-24 DIAGNOSIS — F419 Anxiety disorder, unspecified: Secondary | ICD-10-CM | POA: Diagnosis present

## 2017-02-24 DIAGNOSIS — J189 Pneumonia, unspecified organism: Secondary | ICD-10-CM | POA: Clinically undetermined

## 2017-02-24 DIAGNOSIS — F17210 Nicotine dependence, cigarettes, uncomplicated: Secondary | ICD-10-CM | POA: Diagnosis present

## 2017-02-24 DIAGNOSIS — I236 Thrombosis of atrium, auricular appendage, and ventricle as current complications following acute myocardial infarction: Secondary | ICD-10-CM | POA: Diagnosis present

## 2017-02-24 DIAGNOSIS — Z7982 Long term (current) use of aspirin: Secondary | ICD-10-CM

## 2017-02-24 DIAGNOSIS — Z882 Allergy status to sulfonamides status: Secondary | ICD-10-CM

## 2017-02-24 DIAGNOSIS — E8809 Other disorders of plasma-protein metabolism, not elsewhere classified: Secondary | ICD-10-CM | POA: Diagnosis present

## 2017-02-24 DIAGNOSIS — F329 Major depressive disorder, single episode, unspecified: Secondary | ICD-10-CM | POA: Diagnosis present

## 2017-02-24 DIAGNOSIS — Z961 Presence of intraocular lens: Secondary | ICD-10-CM | POA: Diagnosis present

## 2017-02-24 DIAGNOSIS — I2129 ST elevation (STEMI) myocardial infarction involving other sites: Secondary | ICD-10-CM | POA: Diagnosis present

## 2017-02-24 DIAGNOSIS — D500 Iron deficiency anemia secondary to blood loss (chronic): Secondary | ICD-10-CM | POA: Diagnosis present

## 2017-02-24 DIAGNOSIS — Z800 Family history of malignant neoplasm of digestive organs: Secondary | ICD-10-CM

## 2017-02-24 DIAGNOSIS — K222 Esophageal obstruction: Secondary | ICD-10-CM | POA: Diagnosis present

## 2017-02-24 DIAGNOSIS — I472 Ventricular tachycardia: Secondary | ICD-10-CM | POA: Diagnosis present

## 2017-02-24 DIAGNOSIS — C9000 Multiple myeloma not having achieved remission: Secondary | ICD-10-CM | POA: Diagnosis present

## 2017-02-24 DIAGNOSIS — Z9114 Patient's other noncompliance with medication regimen: Secondary | ICD-10-CM

## 2017-02-24 DIAGNOSIS — K219 Gastro-esophageal reflux disease without esophagitis: Secondary | ICD-10-CM | POA: Diagnosis present

## 2017-02-24 DIAGNOSIS — E785 Hyperlipidemia, unspecified: Secondary | ICD-10-CM | POA: Diagnosis present

## 2017-02-24 DIAGNOSIS — Z79899 Other long term (current) drug therapy: Secondary | ICD-10-CM

## 2017-02-24 DIAGNOSIS — Z90710 Acquired absence of both cervix and uterus: Secondary | ICD-10-CM

## 2017-02-24 DIAGNOSIS — R079 Chest pain, unspecified: Secondary | ICD-10-CM | POA: Diagnosis present

## 2017-02-24 DIAGNOSIS — E876 Hypokalemia: Secondary | ICD-10-CM | POA: Diagnosis present

## 2017-02-24 DIAGNOSIS — Z955 Presence of coronary angioplasty implant and graft: Secondary | ICD-10-CM

## 2017-02-24 DIAGNOSIS — Z8719 Personal history of other diseases of the digestive system: Secondary | ICD-10-CM

## 2017-02-24 DIAGNOSIS — R011 Cardiac murmur, unspecified: Secondary | ICD-10-CM | POA: Diagnosis present

## 2017-02-24 DIAGNOSIS — Z9221 Personal history of antineoplastic chemotherapy: Secondary | ICD-10-CM

## 2017-02-24 DIAGNOSIS — Z8709 Personal history of other diseases of the respiratory system: Secondary | ICD-10-CM

## 2017-02-24 HISTORY — PX: LEFT HEART CATH AND CORONARY ANGIOGRAPHY: CATH118249

## 2017-02-24 HISTORY — PX: CORONARY STENT INTERVENTION: CATH118234

## 2017-02-24 LAB — LIPID PANEL
CHOLESTEROL: 196 mg/dL (ref 0–200)
HDL: 57 mg/dL (ref >40)
LDL Cholesterol: 93 mg/dL (ref 0–99)
Total CHOL/HDL Ratio: 3.4 RATIO
Triglycerides: 228 mg/dL — ABNORMAL HIGH (ref <150)
VLDL: 46 mg/dL — ABNORMAL HIGH (ref 0–40)

## 2017-02-24 LAB — GLUCOSE, CAPILLARY: GLUCOSE-CAPILLARY: 145 mg/dL — AB (ref 65–99)

## 2017-02-24 SURGERY — LEFT HEART CATH AND CORONARY ANGIOGRAPHY
Anesthesia: LOCAL

## 2017-02-24 MED ORDER — HEPARIN (PORCINE) IN NACL 100-0.45 UNIT/ML-% IJ SOLN
750.0000 [IU]/h | INTRAMUSCULAR | Status: DC
  Administered 2017-02-24: 750 [IU]/h via INTRAVENOUS
  Filled 2017-02-24: qty 250

## 2017-02-24 MED ORDER — SODIUM CHLORIDE 0.9% FLUSH
3.0000 mL | Freq: Two times a day (BID) | INTRAVENOUS | Status: DC
  Administered 2017-02-25 – 2017-02-28 (×4): 3 mL via INTRAVENOUS

## 2017-02-24 MED ORDER — IOPAMIDOL (ISOVUE-370) INJECTION 76%
INTRAVENOUS | Status: AC
  Filled 2017-02-24: qty 100

## 2017-02-24 MED ORDER — BIVALIRUDIN TRIFLUOROACETATE 250 MG IV SOLR
INTRAVENOUS | Status: AC
  Filled 2017-02-24: qty 250

## 2017-02-24 MED ORDER — SODIUM CHLORIDE 0.9 % IV SOLN
INTRAVENOUS | Status: DC | PRN
  Administered 2017-02-24 (×2): 10 mL/h via INTRAVENOUS

## 2017-02-24 MED ORDER — ASPIRIN 81 MG PO CHEW
81.0000 mg | CHEWABLE_TABLET | Freq: Every day | ORAL | Status: DC
  Administered 2017-02-25: 81 mg via ORAL
  Filled 2017-02-24: qty 1

## 2017-02-24 MED ORDER — NITROGLYCERIN IN D5W 200-5 MCG/ML-% IV SOLN
0.0000 ug/min | INTRAVENOUS | Status: DC
  Administered 2017-02-24: 3 ug/min via INTRAVENOUS
  Filled 2017-02-24: qty 250

## 2017-02-24 MED ORDER — CALCIUM CARBONATE 1250 (500 CA) MG PO TABS
1.0000 | ORAL_TABLET | Freq: Every day | ORAL | Status: DC
  Administered 2017-02-26 – 2017-03-01 (×4): 500 mg via ORAL
  Filled 2017-02-24 (×6): qty 1

## 2017-02-24 MED ORDER — ACYCLOVIR 400 MG PO TABS
400.0000 mg | ORAL_TABLET | Freq: Two times a day (BID) | ORAL | Status: DC
  Administered 2017-02-25 – 2017-03-01 (×9): 400 mg via ORAL
  Filled 2017-02-24 (×11): qty 1

## 2017-02-24 MED ORDER — NOREPINEPHRINE BITARTRATE 1 MG/ML IV SOLN
INTRAVENOUS | Status: DC | PRN
  Administered 2017-02-24: 4 ug/min via INTRAVENOUS

## 2017-02-24 MED ORDER — LIDOCAINE HCL (PF) 1 % IJ SOLN
INTRAMUSCULAR | Status: DC | PRN
  Administered 2017-02-24: 18 mL via INTRADERMAL

## 2017-02-24 MED ORDER — NITROGLYCERIN 1 MG/10 ML FOR IR/CATH LAB
INTRA_ARTERIAL | Status: DC | PRN
  Administered 2017-02-24: 100 ug via INTRACORONARY
  Administered 2017-02-24 (×2): 150 ug via INTRACORONARY

## 2017-02-24 MED ORDER — NITROGLYCERIN 1 MG/10 ML FOR IR/CATH LAB
INTRA_ARTERIAL | Status: AC
  Filled 2017-02-24: qty 10

## 2017-02-24 MED ORDER — ATORVASTATIN CALCIUM 80 MG PO TABS
80.0000 mg | ORAL_TABLET | Freq: Every day | ORAL | Status: DC
  Administered 2017-02-25 – 2017-02-28 (×4): 80 mg via ORAL
  Filled 2017-02-24 (×4): qty 1

## 2017-02-24 MED ORDER — SODIUM CHLORIDE 0.9 % WEIGHT BASED INFUSION
1.0000 mL/kg/h | INTRAVENOUS | Status: AC
  Administered 2017-02-24: 1 mL/kg/h via INTRAVENOUS

## 2017-02-24 MED ORDER — TICAGRELOR 90 MG PO TABS
90.0000 mg | ORAL_TABLET | Freq: Two times a day (BID) | ORAL | Status: DC
  Administered 2017-02-25 – 2017-03-01 (×9): 90 mg via ORAL
  Filled 2017-02-24 (×9): qty 1

## 2017-02-24 MED ORDER — AMIODARONE HCL IN DEXTROSE 360-4.14 MG/200ML-% IV SOLN
60.0000 mg/h | INTRAVENOUS | Status: DC
  Administered 2017-02-25: 60 mg/h via INTRAVENOUS
  Filled 2017-02-24: qty 200

## 2017-02-24 MED ORDER — HEPARIN (PORCINE) IN NACL 2-0.9 UNIT/ML-% IJ SOLN
INTRAMUSCULAR | Status: DC | PRN
  Administered 2017-02-24: 1000 mL via INTRA_ARTERIAL

## 2017-02-24 MED ORDER — LENALIDOMIDE 15 MG PO CAPS
15.0000 mg | ORAL_CAPSULE | Freq: Every day | ORAL | Status: AC
  Administered 2017-02-25 – 2017-02-26 (×2): 15 mg via ORAL
  Filled 2017-02-24 (×2): qty 1

## 2017-02-24 MED ORDER — ONDANSETRON HCL 4 MG/2ML IJ SOLN
4.0000 mg | Freq: Four times a day (QID) | INTRAMUSCULAR | Status: DC | PRN
  Administered 2017-02-25 (×2): 4 mg via INTRAVENOUS
  Filled 2017-02-24: qty 2

## 2017-02-24 MED ORDER — MIRTAZAPINE 15 MG PO TABS: 15.0000 mg | ORAL_TABLET | Freq: Every day | ORAL | Status: DC

## 2017-02-24 MED ORDER — TIROFIBAN HCL IN NACL 5-0.9 MG/100ML-% IV SOLN
INTRAVENOUS | Status: DC | PRN
  Administered 2017-02-24: 0.075 ug/kg/min via INTRAVENOUS

## 2017-02-24 MED ORDER — ONDANSETRON HCL 4 MG/2ML IJ SOLN
4.0000 mg | Freq: Four times a day (QID) | INTRAMUSCULAR | Status: DC | PRN
Start: 2017-02-24 — End: 2017-03-01
  Filled 2017-02-24 (×2): qty 2

## 2017-02-24 MED ORDER — METOPROLOL TARTRATE 12.5 MG HALF TABLET
12.5000 mg | ORAL_TABLET | Freq: Two times a day (BID) | ORAL | Status: DC
Start: 2017-02-24 — End: 2017-02-26
  Administered 2017-02-25: 12.5 mg via ORAL
  Filled 2017-02-24: qty 1

## 2017-02-24 MED ORDER — SODIUM CHLORIDE 0.9 % IV SOLN
INTRAVENOUS | Status: DC
  Administered 2017-02-24: 20:00:00 via INTRAVENOUS

## 2017-02-24 MED ORDER — PYRIDOSTIGMINE BROMIDE 60 MG PO TABS
30.0000 mg | ORAL_TABLET | Freq: Two times a day (BID) | ORAL | Status: DC
  Administered 2017-02-25 – 2017-03-01 (×9): 30 mg via ORAL
  Filled 2017-02-24 (×11): qty 0.5

## 2017-02-24 MED ORDER — SODIUM CHLORIDE 0.9% FLUSH: 3.0000 mL | INTRAVENOUS | Status: DC | PRN

## 2017-02-24 MED ORDER — TIROFIBAN (AGGRASTAT) BOLUS VIA INFUSION
INTRAVENOUS | Status: DC | PRN
  Administered 2017-02-24: 1495 ug via INTRAVENOUS

## 2017-02-24 MED ORDER — AMIODARONE LOAD VIA INFUSION
150.0000 mg | Freq: Once | INTRAVENOUS | Status: AC
  Administered 2017-02-25: 150 mg via INTRAVENOUS
  Filled 2017-02-24: qty 83.34

## 2017-02-24 MED ORDER — ONDANSETRON HCL 4 MG/2ML IJ SOLN
INTRAMUSCULAR | Status: AC
  Filled 2017-02-24: qty 2

## 2017-02-24 MED ORDER — NITROGLYCERIN 0.4 MG SL SUBL
0.4000 mg | SUBLINGUAL_TABLET | SUBLINGUAL | Status: DC | PRN
  Administered 2017-02-25 (×2): 0.4 mg via SUBLINGUAL
  Filled 2017-02-24: qty 1

## 2017-02-24 MED ORDER — ENSURE ENLIVE PO LIQD
237.0000 mL | Freq: Two times a day (BID) | ORAL | Status: DC
  Administered 2017-02-25 – 2017-02-26 (×2): 237 mL via ORAL

## 2017-02-24 MED ORDER — TIROFIBAN HCL IN NACL 5-0.9 MG/100ML-% IV SOLN
INTRAVENOUS | Status: AC
  Filled 2017-02-24: qty 100

## 2017-02-24 MED ORDER — VITAMIN D3 25 MCG (1000 UNIT) PO TABS
1000.0000 [IU] | ORAL_TABLET | Freq: Every day | ORAL | Status: DC
  Administered 2017-02-25 – 2017-03-01 (×5): 1000 [IU] via ORAL
  Filled 2017-02-24 (×10): qty 1

## 2017-02-24 MED ORDER — DEXAMETHASONE 6 MG PO TABS: 20.0000 mg | ORAL_TABLET | ORAL | Status: DC

## 2017-02-24 MED ORDER — HEPARIN (PORCINE) IN NACL 100-0.45 UNIT/ML-% IJ SOLN: 10.0000 [IU]/kg/h | INTRAMUSCULAR | Status: DC

## 2017-02-24 MED ORDER — DEXTROSE 5 % IV SOLN
0.0000 ug/min | INTRAVENOUS | Status: DC
  Filled 2017-02-24: qty 4

## 2017-02-24 MED ORDER — TICAGRELOR 90 MG PO TABS
ORAL_TABLET | ORAL | Status: DC | PRN
  Administered 2017-02-24: 180 mg via ORAL

## 2017-02-24 MED ORDER — BIVALIRUDIN BOLUS VIA INFUSION - CUPID
INTRAVENOUS | Status: DC | PRN
  Administered 2017-02-24: 44.85 mg via INTRAVENOUS

## 2017-02-24 MED ORDER — POLYETHYLENE GLYCOL 3350 17 G PO PACK
17.0000 g | PACK | Freq: Every day | ORAL | Status: DC | PRN
  Administered 2017-03-01: 17 g via ORAL
  Filled 2017-02-24: qty 1

## 2017-02-24 MED ORDER — HEPARIN SODIUM (PORCINE) 5000 UNIT/ML IJ SOLN
60.0000 [IU]/kg | Freq: Once | INTRAMUSCULAR | Status: AC
  Administered 2017-02-24: 4000 [IU] via INTRAVENOUS

## 2017-02-24 MED ORDER — DULOXETINE HCL 20 MG PO CPEP
20.0000 mg | ORAL_CAPSULE | Freq: Every day | ORAL | Status: DC
  Administered 2017-02-25 – 2017-03-01 (×5): 20 mg via ORAL
  Filled 2017-02-24 (×5): qty 1

## 2017-02-24 MED ORDER — ACETAMINOPHEN 325 MG PO TABS
650.0000 mg | ORAL_TABLET | ORAL | Status: DC | PRN
  Administered 2017-02-25 – 2017-02-28 (×3): 650 mg via ORAL
  Filled 2017-02-24 (×3): qty 2

## 2017-02-24 MED ORDER — PANTOPRAZOLE SODIUM 40 MG PO TBEC
40.0000 mg | DELAYED_RELEASE_TABLET | Freq: Two times a day (BID) | ORAL | Status: DC
  Filled 2017-02-24: qty 1

## 2017-02-24 MED ORDER — VITAMIN C 500 MG PO TABS
1000.0000 mg | ORAL_TABLET | Freq: Every day | ORAL | Status: DC
  Administered 2017-02-25 – 2017-03-01 (×5): 1000 mg via ORAL
  Filled 2017-02-24 (×5): qty 2

## 2017-02-24 MED ORDER — SODIUM CHLORIDE 0.9 % IV SOLN
INTRAVENOUS | Status: DC | PRN
  Administered 2017-02-24 (×2): 1.75 mg/kg/h via INTRAVENOUS

## 2017-02-24 MED ORDER — TICAGRELOR 90 MG PO TABS
ORAL_TABLET | ORAL | Status: AC
  Filled 2017-02-24: qty 2

## 2017-02-24 MED ORDER — ASPIRIN EC 81 MG PO TBEC
81.0000 mg | DELAYED_RELEASE_TABLET | Freq: Every day | ORAL | Status: DC
  Administered 2017-02-25 – 2017-03-01 (×5): 81 mg via ORAL
  Filled 2017-02-24 (×5): qty 1

## 2017-02-24 MED ORDER — POTASSIUM CHLORIDE 10 MEQ/100ML IV SOLN
10.0000 meq | Freq: Once | INTRAVENOUS | Status: AC
  Administered 2017-02-24: 10 meq via INTRAVENOUS
  Filled 2017-02-24: qty 100

## 2017-02-24 MED ORDER — AMIODARONE HCL IN DEXTROSE 360-4.14 MG/200ML-% IV SOLN: 30.0000 mg/h | INTRAVENOUS | Status: DC

## 2017-02-24 MED ORDER — SODIUM CHLORIDE 0.9 % IV SOLN: 250.0000 mL | INTRAVENOUS | Status: DC | PRN

## 2017-02-24 MED ORDER — ONDANSETRON HCL 4 MG/2ML IJ SOLN
INTRAMUSCULAR | Status: DC | PRN
  Administered 2017-02-24: 4 mg via INTRAVENOUS

## 2017-02-24 MED ORDER — CLOPIDOGREL BISULFATE 300 MG PO TABS
ORAL_TABLET | ORAL | Status: AC
  Filled 2017-02-24: qty 2

## 2017-02-24 MED ORDER — LIDOCAINE HCL (PF) 1 % IJ SOLN
INTRAMUSCULAR | Status: AC
  Filled 2017-02-24: qty 30

## 2017-02-24 MED ORDER — NOREPINEPHRINE BITARTRATE 1 MG/ML IV SOLN
INTRAVENOUS | Status: AC
Start: 2017-02-24 — End: 2017-02-24
  Filled 2017-02-24: qty 4

## 2017-02-24 MED ORDER — PRAVASTATIN SODIUM 40 MG PO TABS: 40.0000 mg | ORAL_TABLET | Freq: Every evening | ORAL | Status: DC

## 2017-02-24 MED ORDER — HEPARIN (PORCINE) IN NACL 2-0.9 UNIT/ML-% IJ SOLN
INTRAMUSCULAR | Status: AC
  Filled 2017-02-24: qty 1000

## 2017-02-24 MED ORDER — HEPARIN SODIUM (PORCINE) 5000 UNIT/ML IJ SOLN
INTRAMUSCULAR | Status: AC
  Filled 2017-02-24: qty 1

## 2017-02-24 SURGICAL SUPPLY — 23 items
BALLN ANGIOSCULPT RX 3.0X6 (BALLOONS) ×2
BALLN EMERGE MR 2.5X12 (BALLOONS) ×2
BALLN ~~LOC~~ EMERGE MR 3.0X20 (BALLOONS) ×6
BALLN ~~LOC~~ EMERGE MR 3.0X8 (BALLOONS) ×2
BALLN ~~LOC~~ EMERGE MR 3.25X20 (BALLOONS) ×2
BALLOON ANGIOSCULPT RX 3.0X6 (BALLOONS) IMPLANT
BALLOON EMERGE MR 2.5X12 (BALLOONS) IMPLANT
BALLOON ~~LOC~~ EMERGE MR 3.0X20 (BALLOONS) IMPLANT
BALLOON ~~LOC~~ EMERGE MR 3.0X8 (BALLOONS) IMPLANT
BALLOON ~~LOC~~ EMERGE MR 3.25X20 (BALLOONS) IMPLANT
CATH INFINITI 5FR MULTPACK ANG (CATHETERS) ×1 IMPLANT
CATH VISTA GUIDE 6FR XB3 (CATHETERS) ×1 IMPLANT
GUIDEWIRE 3MM J TIP .035 145 (WIRE) ×1 IMPLANT
KIT ENCORE 26 ADVANTAGE (KITS) ×2 IMPLANT
KIT HEART LEFT (KITS) ×2 IMPLANT
PACK CARDIAC CATHETERIZATION (CUSTOM PROCEDURE TRAY) ×2 IMPLANT
SHEATH PINNACLE 6F 10CM (SHEATH) ×1 IMPLANT
STENT XIENCE ALPINE RX 3.0X12 (Permanent Stent) ×1 IMPLANT
STENT XIENCE ALPINE RX 3.0X28 (Permanent Stent) ×1 IMPLANT
TRANSDUCER W/STOPCOCK (MISCELLANEOUS) ×2 IMPLANT
WIRE HI TORQ BMW 190CM (WIRE) ×1 IMPLANT
WIRE PT2 MS 185 (WIRE) ×3 IMPLANT
WIRE RUNTHROUGH .014X180CM (WIRE) ×1 IMPLANT

## 2017-02-24 NOTE — H&P (Signed)
Abigail [name] is an 71 y.o. female.   Chief Complaint: Chest pain HPI: Patient is 71 year old female with past medical history significant for coronary artery disease history of MI in the remote past status post PCI to left circumflex in 1999 and PTCA stenting to proximal LAD and ostial diagonal 1 in March 2018, history of bilateral renal artery stenosis requiring bilateral stenting in 2005, hypertension, hyperlipidemia, GERD, history of peptic ulcer disease and remote past, multiple myeloma, history of diverticulosis, came to ER by EMS as code STEMI was called. Patient complained of retrosternal chest pain described as pressure grade 8/10 associated with nausea or diaphoresis and shortness of breath approximately 2 hours prior to arrival to the ED EKG done in the ED showed normal sinus rhythm with ST elevation in anterolateral leads and reciprocal changes in inferior leads.  Past Medical History:  Diagnosis Date  . Anemia    "off and on all my life" (12/22/2016)  . Anxiety   . Bilateral renal artery stenosis ([place]) 1999   s/p stenting  . Chronic kidney disease (CKD), stage III (moderate)    Archie Endo 12/22/2016  . Coronary artery disease 1999  . Depression   . Diverticulosis   . Duodenitis   . Gastric erosions   . Gastric ulcer   . GERD (gastroesophageal reflux disease)   . Heart murmur   . History of blood transfusion ~ 03/2016   "low HgB; practically nonexistent"  . Hyperlipidemia   . Hypertension   . MI (myocardial infarction) ([place]) 1999  . Multiple myeloma ([place]) dx'd ~ 04/2016  . PAT (paroxysmal atrial tachycardia) ([place]) 12/22/2016   Archie Endo 12/22/2016  . Retinal hemorrhage, right eye   . Schatzki's ring   . Tachy-brady syndrome ([place])    Archie Endo 12/22/2016  . Tubular adenoma of colon     Past Surgical History:  Procedure Laterality Date  . APPENDECTOMY    . BACK SURGERY    . CATARACT EXTRACTION W/ INTRAOCULAR LENS IMPLANT Right 2014  . CORONARY ANGIOGRAPHY N/A 01/23/2017   Procedure:  Coronary Angiography;  Surgeon: [name], MD;  Location: [place] [facility];  Service: Cardiovascular;  Laterality: N/A;  . CORONARY ANGIOPLASTY WITH STENT PLACEMENT  1999  . CORONARY STENT INTERVENTION N/A 12/29/2016   Procedure: Coronary Stent Intervention;  Surgeon: Charolette Forward, MD;  Location: [place];  Service: Cardiovascular;  Laterality: N/A;  . ESOPHAGOGASTRODUODENOSCOPY N/A 11/03/2015   Procedure: ESOPHAGOGASTRODUODENOSCOPY (EGD);  Surgeon: Carol Ada, MD;  Location: [facility] ENDOSCOPY;  Service: Endoscopy;  Laterality: N/A;  . ESOPHAGOGASTRODUODENOSCOPY (EGD) WITH PROPOFOL N/A 11/18/2016   Procedure: ESOPHAGOGASTRODUODENOSCOPY (EGD) WITH PROPOFOL;  Surgeon: Ladene Artist, MD;  Location: [facility] ENDOSCOPY;  Service: Endoscopy;  Laterality: N/A;  . LEFT HEART CATH AND CORONARY ANGIOGRAPHY N/A 12/24/2016   Procedure: Left Heart Cath and Coronary Angiography;  Surgeon: [name], MD;  Location: [place] [facility];  Service: Cardiovascular;  Laterality: N/A;  . LUMBAR [place]    . RENAL ARTERY STENT  1999   bil RAS so ? bil vs unilateral stents  . RETINAL LASER PROCEDURE Right 2012   "bleeding"  . TONSILLECTOMY    . TOTAL ABDOMINAL HYSTERECTOMY      Family History  Problem Relation Age of Onset  . Colon cancer Neg Hx    Social History:  reports that she has been smoking Cigarettes.  She has a 25.00 pack-year smoking history. She has never used smokeless tobacco. She reports that she does not drink alcohol  or use drugs.  Allergies:  Allergies  Allergen Reactions  . Sulfa Antibiotics Rash    Medications Prior to Admission  Medication Sig Dispense Refill  . acyclovir (ZOVIRAX) 400 MG tablet TAKE 1 TABLET (400 MG TOTAL) BY MOUTH 2 (TWO) TIMES DAILY. 60 tablet 2  . Ascorbic Acid (VITAMIN C) 1000 MG tablet Take 1,000 mg by mouth daily.     Marland Kitchen aspirin EC 81 MG tablet Take 81 mg by mouth daily.    . calcium carbonate (OS-CAL - DOSED IN MG OF ELEMENTAL CALCIUM) 1250 (500  Ca) MG tablet Take 1 tablet by mouth daily with breakfast.    . Cholecalciferol (VITAMIN D PO) Take 1 capsule by mouth daily.    Marland Kitchen dexamethasone (DECADRON) 4 MG tablet Take 5 tablets (20 mg total) by mouth once a week. 20 tablet 1  . DULoxetine (CYMBALTA) 20 MG capsule Take 1 capsule (20 mg total) by mouth daily. 30 capsule 0  . feeding supplement, ENSURE ENLIVE, (ENSURE ENLIVE) LIQD Take 237 mLs by mouth 2 (two) times daily between meals. (Patient taking differently: Take 237 mLs by mouth See admin instructions. Two to three times a day) 237 mL 30  . lenalidomide (REVLIMID) 15 MG capsule Take 1 capsule (15 mg total) by mouth daily. For 2 weeks then 2 weeks off.    . mirtazapine (REMERON) 15 MG tablet Take 1 tablet (15 mg total) by mouth at bedtime. 30 tablet 3  . pantoprazole (PROTONIX) 40 MG tablet Take 1 tablet (40 mg total) by mouth 2 (two) times daily before a meal. 60 tablet 0  . polyethylene glycol (MIRALAX / GLYCOLAX) packet Take 17 g by mouth daily as needed for moderate constipation.     . potassium chloride (K-DUR,KLOR-CON) 10 MEQ tablet Take 1 tablet (10 mEq total) by mouth daily. 30 tablet 1  . pravastatin (PRAVACHOL) 40 MG tablet Take 40 mg by mouth every evening.  1  . pyridostigmine (MESTINON) 60 MG tablet Take 0.5 tablets (30 mg total) by mouth 2 (two) times daily. 30 tablet 3  . ticagrelor (BRILINTA) 90 MG TABS tablet Take 1 tablet (90 mg total) by mouth 2 (two) times daily. 60 tablet 3    Results for orders placed or performed during the hospital encounter of 02/24/17 (from the past 48 hour(s))  Lipid panel     Status: Abnormal   Collection Time: 02/24/17  8:28 PM  Result Value Ref Range   Cholesterol 196 0 - 200 mg/dL   Triglycerides 228 (H) <150 mg/dL   HDL 57 >40 mg/dL   Total CHOL/HDL Ratio 3.4 RATIO   VLDL 46 (H) 0 - 40 mg/dL   LDL Cholesterol 93 0 - 99 mg/dL    Comment:        Total Cholesterol/HDL:CHD Risk Coronary Heart Disease Risk Table                     Men    Women  1/2 Average Risk   3.4   3.3  Average Risk       5.0   4.4  2 X Average Risk   9.6   7.1  3 X Average Risk  23.4   11.0        Use the calculated Patient Ratio above and the CHD Risk Table to determine the patient's CHD Risk.        ATP III CLASSIFICATION (LDL):  <100     mg/dL   Optimal  100-129  mg/dL   Near or Above                    Optimal  130-159  mg/dL   Borderline  160-189  mg/dL   High  >190     mg/dL   Very High    No results found.  Review of Systems  Unable to perform ROS: Acuity of condition    Blood pressure 122/90, pulse 75, temperature 97.9 F (36.6 C), temperature source Oral, resp. rate 13, SpO2 100 %. Physical Exam  Constitutional: She is oriented to person, place, and time.  Eyes: Conjunctivae are normal. Left eye exhibits no discharge. No scleral icterus.  Neck: Neck supple. No JVD present. No tracheal deviation present. No thyromegaly present.  Cardiovascular: Normal rate and regular rhythm.   Murmur: soft systolic murmur and S4 gallop noted. Respiratory:  Decreased breath sound at bases clear anterolaterally  GI: Soft. Bowel sounds are normal. She exhibits no distension. There is no tenderness. There is no rebound.  Musculoskeletal: She exhibits no edema, tenderness or deformity.  Neurological: She is alert and oriented to person, place, and time.     Assessment/Plan Acute anterolateral wall myocardial infarction Coronary artery disease history of MI in the remote past status post PCI to left circumflex in 1999 and PTCA stenting to proximal LAD and ostial diagonal 1 approximately 2 months ago Hypertension Hyperlipidemia History of bilateral renal stenting in the past Multiple myeloma GERD History of peptic ulcer disease History of multifocal atrial tachycardia/SVT in the past Plan Discussed briefly with patient regarding emergency left cath possible PTCA stenting its risk and benefits and consents for PCI  Charolette Forward,  MD 02/24/2017, 10:46 PM

## 2017-02-24 NOTE — Progress Notes (Signed)
ANTICOAGULATION CONSULT NOTE - Initial Consult  Pharmacy Consult for Heparin Indication: chest pain/ACS  Allergies  Allergen Reactions  . Sulfa Antibiotics Rash    Patient Measurements:   Heparin Dosing Weight: 59.8 kg  Vital Signs: BP: 122/90 (05/08 2020) Pulse Rate: 75 (05/08 2020)  Labs: No results for input(s): HGB, HCT, PLT, APTT, LABPROT, INR, HEPARINUNFRC, HEPRLOWMOCWT, CREATININE, CKTOTAL, CKMB, TROPONINI in the last 72 hours.  Estimated Creatinine Clearance: 47.1 mL/min (by C-G formula based on SCr of 1 mg/dL).   Medical History: Past Medical History:  Diagnosis Date  . Anemia    "off and on all my life" (12/22/2016)  . Anxiety   . Bilateral renal artery stenosis ([place]) 1999   s/p stenting  . Chronic kidney disease (CKD), stage III (moderate)    Archie Endo 12/22/2016  . Coronary artery disease 1999  . Depression   . Diverticulosis   . Duodenitis   . Gastric erosions   . Gastric ulcer   . GERD (gastroesophageal reflux disease)   . Heart murmur   . History of blood transfusion ~ 03/2016   "low HgB; practically nonexistent"  . Hyperlipidemia   . Hypertension   . MI (myocardial infarction) ([place]) 1999  . Multiple myeloma ([place]) dx'd ~ 04/2016  . PAT (paroxysmal atrial tachycardia) ([place]) 12/22/2016   Archie Endo 12/22/2016  . Retinal hemorrhage, right eye   . Schatzki's ring   . Tachy-brady syndrome ([place])    Archie Endo 12/22/2016  . Tubular adenoma of colon     Medications:   (Not in a hospital admission) Scheduled:   Infusions:  . sodium chloride 10 mL/hr at 02/24/17 2025  . heparin    . nitroGLYCERIN 3 mcg/min (02/24/17 2025)    Assessment: 71yo female presents with CP. Pharmacy is consulted to dose heparin for ACS/chest pain.   Goal of Therapy:  Heparin level 0.3-0.7 units/ml Monitor platelets by anticoagulation protocol: Yes   Plan:  Give 4000 units bolus x 1 Start heparin infusion at 750 units/hr Check anti-Xa level in 8 hours and daily while on  heparin Continue to monitor H&H and platelets  Andrey Cota. Diona Foley, PharmD, [place] Pharmacist [phone] 02/24/2017,8:26 PM

## 2017-02-24 NOTE — ED Triage Notes (Signed)
Pt comes via [facility], c/o CP that started 2 hours ago, central, non radiating, with SOB and nausea and diaphoresis. Pain 8/10 ASA PTA HX of Afib and MI

## 2017-02-24 NOTE — ED Provider Notes (Signed)
[facility] DEPT Provider Note   CSN: [id number] Arrival date & time: 02/24/17  2012     History   Chief Complaint Chief Complaint  Patient presents with  . Code STEMI    HPI Abigail [name] is a 71 y.o. female.  HPI Patient presents to the emergency room for evaluation of chest pain. Patient has history of coronary artery disease. She had a recent cardiac catheterization last month.  The results are available in the EMR. Patient was continued on medical management.  Patient started having acute chest pain this evening about 2 hours ago. She's had some shortness of breath as well as nausea and diaphoresis. Patient describes the pain as an 8 out of 10.  EMS was called. When they arrived they did an EKG. Patient had findings consistent with an ST elevation myocardial infarction. Code STEMI was activated. Past Medical History:  Diagnosis Date  . Anemia    "off and on all my life" (12/22/2016)  . Anxiety   . Bilateral renal artery stenosis ([place]) 1999   s/p stenting  . Chronic kidney disease (CKD), stage III (moderate)    Archie Endo 12/22/2016  . Coronary artery disease 1999  . Depression   . Diverticulosis   . Duodenitis   . Gastric erosions   . Gastric ulcer   . GERD (gastroesophageal reflux disease)   . Heart murmur   . History of blood transfusion ~ 03/2016   "low HgB; practically nonexistent"  . Hyperlipidemia   . Hypertension   . MI (myocardial infarction) ([place]) 1999  . Multiple myeloma ([place]) dx'd ~ 04/2016  . PAT (paroxysmal atrial tachycardia) ([place]) 12/22/2016   Archie Endo 12/22/2016  . Retinal hemorrhage, right eye   . Schatzki's ring   . Tachy-brady syndrome ([place])    Archie Endo 12/22/2016  . Tubular adenoma of colon     Patient Active Problem List   Diagnosis Date Noted  . Dehydration 01/21/2017  . Acute coronary syndrome ([place]) 12/24/2016  . Dizziness 12/22/2016    Class: Acute  . Orthostatic hypotension 12/08/2016  . Syncope 12/08/2016  . Syncope and collapse 12/06/2016  .  Tachycardia-bradycardia ([place])   . Essential hypertension   . Nausea and vomiting in adult patient   . Erosive esophagitis   . Protein-calorie malnutrition, severe 11/19/2016  . LLQ pain   . LUQ abdominal pain   . Intractable nausea and vomiting 11/17/2016  . Abdominal pain 11/05/2016  . Multiple myeloma ([place]) 06/18/2016  . Malnutrition of moderate degree 06/14/2016  . 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 Exertional dyspnea 11/01/2015  . Coronary atherosclerosis of native coronary artery 11/01/2015  . Shortness of breath 11/01/2015  . Chest pain 12/31/2011    Past Surgical History:  Procedure Laterality Date  . APPENDECTOMY    . BACK SURGERY    . CATARACT EXTRACTION W/ INTRAOCULAR LENS IMPLANT Right 2014  . CORONARY ANGIOGRAPHY N/A 01/23/2017   Procedure: Coronary Angiography;  Surgeon: [name], MD;  Location: [facility];  Service: Cardiovascular;  Laterality: N/A;  . CORONARY ANGIOPLASTY WITH STENT PLACEMENT  1999  . CORONARY STENT INTERVENTION N/A 12/29/2016   Procedure: Coronary Stent Intervention;  Surgeon: Charolette Forward, MD;  Location: [facility];  Service: Cardiovascular;  Laterality: N/A;  . ESOPHAGOGASTRODUODENOSCOPY N/A 11/03/2015   Procedure: ESOPHAGOGASTRODUODENOSCOPY (EGD);  Surgeon: Carol Ada, MD;  Location[facility] ENDOSCOPY;  Service: Endoscopy;  Laterality: N/A;  . ESOPHAGOGASTRODUODENOSCOPY (EGD) WITH PROPOFOL N/A 11/18/2016   Procedure: ESOPHAGOGASTRODUODENOSCOPY (  EGD) WITH PROPOFOL;  Surgeon: Ladene Artist, MD;  Location: [facility] ENDOSCOPY;  Service: Endoscopy;  Laterality: N/A;  . LEFT HEART CATH AND CORONARY ANGIOGRAPHY N/A 12/24/2016   Procedure: Left Heart Cath and Coronary Angiography;  Surgeon: [name], MD;  Location: [place] [facility];  Service: Cardiovascular;  Laterality: N/A;  . LUMBAR [place]    . RENAL ARTERY STENT  1999   bil RAS so ? bil vs unilateral stents  . RETINAL LASER PROCEDURE Right 2012    "bleeding"  . TONSILLECTOMY    . TOTAL ABDOMINAL HYSTERECTOMY      OB History    No data available       Home Medications    Prior to Admission medications   Medication Sig Start Date End Date Taking? Authorizing Provider  acyclovir (ZOVIRAX) 400 MG tablet TAKE 1 TABLET (400 MG TOTAL) BY MOUTH 2 (TWO) TIMES DAILY. 08/29/16   Ladell Pier, MD  Ascorbic Acid (VITAMIN C) 1000 MG tablet Take 1,000 mg by mouth daily.     [provider]  aspirin EC 81 MG tablet Take 81 mg by mouth daily.    [provider]  calcium carbonate (OS-CAL - DOSED IN MG OF ELEMENTAL CALCIUM) 1250 (500 Ca) MG tablet Take 1 tablet by mouth daily with breakfast.    [provider]  Cholecalciferol (VITAMIN D PO) Take 1 capsule by mouth daily.    [provider]  dexamethasone (DECADRON) 4 MG tablet Take 5 tablets (20 mg total) by mouth once a week. 02/12/17   Ladell Pier, MD  DULoxetine (CYMBALTA) 20 MG capsule Take 1 capsule (20 mg total) by mouth daily. 12/10/16   Lavina Hamman, MD  feeding supplement, ENSURE ENLIVE, (ENSURE ENLIVE) LIQD Take 237 mLs by mouth 2 (two) times daily between meals. Patient taking differently: Take 237 mLs by mouth See admin instructions. Two to three times a day 11/27/16   Mariel Aloe, MD  lenalidomide (REVLIMID) 15 MG capsule Take 1 capsule (15 mg total) by mouth daily. For 2 weeks then 2 weeks off. 02/12/17   Ladell Pier, MD  mirtazapine (REMERON) 15 MG tablet Take 1 tablet (15 mg total) by mouth at bedtime. 12/27/16   [name], MD  pantoprazole (PROTONIX) 40 MG tablet Take 1 tablet (40 mg total) by mouth 2 (two) times daily before a meal. 11/21/16   Hongalgi, Lenis Dickinson, MD  polyethylene glycol (MIRALAX / GLYCOLAX) packet Take 17 g by mouth daily as needed for moderate constipation.     [provider]  potassium chloride (K-DUR,KLOR-CON) 10 MEQ tablet Take 1 tablet (10 mEq total) by mouth daily. 12/27/16   [name], MD    pravastatin (PRAVACHOL) 40 MG tablet Take 40 mg by mouth every evening. 06/03/15   [provider]  pyridostigmine (MESTINON) 60 MG tablet Take 0.5 tablets (30 mg total) by mouth 2 (two) times daily. 01/25/17   [name], MD  ticagrelor (BRILINTA) 90 MG TABS tablet Take 1 tablet (90 mg total) by mouth 2 (two) times daily. 12/27/16   [name], MD    Family History Family History  Problem Relation Age of Onset  . Colon cancer Neg Hx     Social History Social History  Substance Use Topics  . Smoking status: Current Every Day Smoker    Packs/day: 0.50    Years: 50.00    Types: Cigarettes  . Smokeless tobacco: Never Used  . Alcohol use No  Allergies   Sulfa antibiotics   Review of Systems Review of Systems   Physical Exam Updated Vital Signs BP 122/90   Pulse 75   Temp 97.9 F (36.6 C) (Oral)   Resp 13   SpO2 100%   Physical Exam  Constitutional: She appears distressed.  HENT:  Head: Normocephalic and atraumatic.  Right Ear: External ear normal.  Left Ear: External ear normal.  Eyes: Conjunctivae are normal. Right eye exhibits no discharge. Left eye exhibits no discharge. No scleral icterus.  Neck: Neck supple. No tracheal deviation present.  Cardiovascular: Normal rate, regular rhythm and intact distal pulses.   Pulmonary/Chest: Effort normal and breath sounds normal. No stridor. No respiratory distress. She has no wheezes. She has no rales.  Abdominal: Soft. Bowel sounds are normal. She exhibits no distension. There is no tenderness. There is no rebound and no guarding.  Musculoskeletal: She exhibits no edema or tenderness.  Neurological: She is alert. She has normal strength. No cranial nerve deficit (no facial droop, extraocular movements intact, no slurred speech) or sensory deficit. She exhibits normal muscle tone. She displays no seizure activity. Coordination normal.  Skin: Skin is warm and dry. No rash noted. There is pallor.  Psychiatric:  She has a normal mood and affect.  Nursing note and vitals reviewed.    ED Treatments / Results  Labs (all labs ordered are listed, but only abnormal results are displayed) Labs Reviewed  CBC WITH DIFFERENTIAL/PLATELET  PROTIME-INR  APTT  COMPREHENSIVE METABOLIC PANEL  TROPONIN I  LIPID PANEL    EKG  EKG Interpretation  Date/Time:  Tuesday Feb 24 2017 20:23:26 EDT Ventricular Rate:  75 PR Interval:    QRS Duration: 89 QT Interval:  446 QTC Calculation: 495 R Axis:   72 Text Interpretation:  Unknown rhythm, irregular rate Borderline short PR interval Probable anterolateral infarct, acute ** ** ACUTE MI / STEMI ** ** st elevation is new since last tracing Confirmed by Fontaine Hehl  MD-J, Graciano Batson ([id number]) on 02/24/2017 8:36:39 PM       Radiology No results found.  Procedures Procedures (including critical care time)  Medications Ordered in ED Medications  0.9 %  sodium chloride infusion ( Intravenous New Bag/Given 02/24/17 2025)  nitroGLYCERIN 50 mg in dextrose 5 % 250 mL (0.2 mg/mL) infusion (0 mcg/min Intravenous Stopped 02/24/17 2049)  heparin ADULT infusion 100 units/mL (25000 units/252m sodium chloride 0.45%) (750 Units/hr Intravenous New Bag/Given 02/24/17 2027)  bivalirudin (ANGIOMAX) BOLUS via infusion (44.85 mg Intravenous Given 02/24/17 2050)  acyclovir (ZOVIRAX) tablet 400 mg (not administered)  vitamin C (ASCORBIC ACID) tablet 1,000 mg (not administered)  aspirin EC tablet 81 mg (not administered)  calcium carbonate (OS-CAL - dosed in mg of elemental calcium) tablet 500 mg of elemental calcium (not administered)  cholecalciferol (VITAMIN D) tablet 1,000 Units (not administered)  dexamethasone (DECADRON) tablet 20 mg (not administered)  DULoxetine (CYMBALTA) DR capsule 20 mg (not administered)  feeding supplement (ENSURE ENLIVE) (ENSURE ENLIVE) liquid 237 mL (not administered)  lenalidomide (REVLIMID) capsule 15 mg (not administered)  mirtazapine (REMERON) tablet 15 mg (not  administered)  pantoprazole (PROTONIX) EC tablet 40 mg (not administered)  polyethylene glycol (MIRALAX / GLYCOLAX) packet 17 g (not administered)  pravastatin (PRAVACHOL) tablet 40 mg (not administered)  pyridostigmine (MESTINON) tablet 30 mg (not administered)  norepinephrine (LEVOPHED) 4 mg in dextrose 5 % 250 mL (0.016 mg/mL) infusion (not administered)  bivalirudin (ANGIOMAX) 250 mg in sodium chloride 0.9 % 50 mL (5 mg/mL) infusion (1.75 mg/kg/hr  59.8 kg Intravenous New Bag/Given 02/24/17 2052)  norepinephrine (LEVOPHED) 4 mg in dextrose 5 % 250 mL (0.016 mg/mL) infusion (4 mcg/min Intravenous New Bag/Given 02/24/17 2101)  heparin injection 3,600 Units (4,000 Units Intravenous Given 02/24/17 2023)     Initial Impression / Assessment and Plan / ED Course  I have reviewed the triage vital signs and the nursing notes.  Pertinent labs & imaging results that were available during my care of the patient were reviewed by me and considered in my medical decision making (see chart for details).    Patient presented as an ST elevation myocardial infarction. She was activated out in the field. In the emergency room, the nurses were able to obtain IV access. Patient was started on IV fluids and heparin. Low dose nitroglycerin drip was started as directed by the cardiologist.  Pt was taken to the cath lab in stable condition.  Final Clinical Impressions(s) / ED Diagnoses   Final diagnoses:  Acute ST elevation myocardial infarction (STEMI), unspecified artery (HCC)      Dorie Rank, MD 02/24/17 2106

## 2017-02-25 ENCOUNTER — Other Ambulatory Visit (HOSPITAL_COMMUNITY): Payer: Medicare HMO

## 2017-02-25 ENCOUNTER — Encounter (HOSPITAL_COMMUNITY): Payer: Self-pay | Admitting: Cardiology

## 2017-02-25 ENCOUNTER — Telehealth: Payer: Self-pay | Admitting: *Deleted

## 2017-02-25 LAB — CBC WITH DIFFERENTIAL/PLATELET
Basophils Absolute: 0 10*3/uL (ref 0.0–0.1)
Basophils Relative: 0 %
Eosinophils Absolute: 0 10*3/uL (ref 0.0–0.7)
Eosinophils Relative: 0 %
HCT: 26.2 % — ABNORMAL LOW (ref 36.0–46.0)
Hemoglobin: 8.4 g/dL — ABNORMAL LOW (ref 12.0–15.0)
Lymphocytes Relative: 8 %
Lymphs Abs: 1 10*3/uL (ref 0.7–4.0)
MCH: 29.4 pg (ref 26.0–34.0)
MCHC: 32.1 g/dL (ref 30.0–36.0)
MCV: 91.6 fL (ref 78.0–100.0)
Monocytes Absolute: 0.8 10*3/uL (ref 0.1–1.0)
Monocytes Relative: 7 %
Neutro Abs: 10.3 10*3/uL — ABNORMAL HIGH (ref 1.7–7.7)
Neutrophils Relative %: 85 %
Platelets: 248 10*3/uL (ref 150–400)
RBC: 2.86 MIL/uL — ABNORMAL LOW (ref 3.87–5.11)
RDW: 14.8 % (ref 11.5–15.5)
WBC: 12.1 10*3/uL — ABNORMAL HIGH (ref 4.0–10.5)

## 2017-02-25 LAB — CBC
HEMATOCRIT: 27.5 % — AB (ref 36.0–46.0)
Hemoglobin: 8.9 g/dL — ABNORMAL LOW (ref 12.0–15.0)
MCH: 29.9 pg (ref 26.0–34.0)
MCHC: 32.4 g/dL (ref 30.0–36.0)
MCV: 92.3 fL (ref 78.0–100.0)
PLATELETS: ADEQUATE 10*3/uL (ref 150–400)
RBC: 2.98 MIL/uL — ABNORMAL LOW (ref 3.87–5.11)
RDW: 15.1 % (ref 11.5–15.5)
WBC: 12 10*3/uL — ABNORMAL HIGH (ref 4.0–10.5)

## 2017-02-25 LAB — APTT: aPTT: 72 seconds — ABNORMAL HIGH (ref 24–36)

## 2017-02-25 LAB — COMPREHENSIVE METABOLIC PANEL
ALT: 22 U/L (ref 14–54)
AST: 219 U/L — ABNORMAL HIGH (ref 15–41)
Albumin: 2.6 g/dL — ABNORMAL LOW (ref 3.5–5.0)
Alkaline Phosphatase: 85 U/L (ref 38–126)
Anion gap: 11 (ref 5–15)
BUN: 7 mg/dL (ref 6–20)
CO2: 18 mmol/L — ABNORMAL LOW (ref 22–32)
Calcium: 7.1 mg/dL — ABNORMAL LOW (ref 8.9–10.3)
Chloride: 109 mmol/L (ref 101–111)
Creatinine, Ser: 0.7 mg/dL (ref 0.44–1.00)
GFR calc Af Amer: >60 mL/min (ref >60)
GFR calc non Af Amer: >60 mL/min (ref >60)
Glucose, Bld: 164 mg/dL — ABNORMAL HIGH (ref 65–99)
Potassium: 3 mmol/L — ABNORMAL LOW (ref 3.5–5.1)
Sodium: 138 mmol/L (ref 135–145)
Total Bilirubin: 0.7 mg/dL (ref 0.3–1.2)
Total Protein: 4.6 g/dL — ABNORMAL LOW (ref 6.5–8.1)

## 2017-02-25 LAB — POCT I-STAT, CHEM 8
BUN: 9 mg/dL (ref 6–20)
CREATININE: 0.5 mg/dL (ref 0.44–1.00)
Calcium, Ion: 1.04 mmol/L — ABNORMAL LOW (ref 1.15–1.40)
Chloride: 105 mmol/L (ref 101–111)
Glucose, Bld: 114 mg/dL — ABNORMAL HIGH (ref 65–99)
HEMATOCRIT: 24 % — AB (ref 36.0–46.0)
HEMOGLOBIN: 8.2 g/dL — AB (ref 12.0–15.0)
Potassium: 2.7 mmol/L — CL (ref 3.5–5.1)
Sodium: 137 mmol/L (ref 135–145)
TCO2: 19 mmol/L (ref 0–100)

## 2017-02-25 LAB — MRSA PCR SCREENING: MRSA BY PCR: NEGATIVE

## 2017-02-25 LAB — PROTIME-INR
INR: 1.67
Prothrombin Time: 19.9 seconds — ABNORMAL HIGH (ref 11.4–15.2)

## 2017-02-25 LAB — POCT ACTIVATED CLOTTING TIME
ACTIVATED CLOTTING TIME: 147 s
Activated Clotting Time: 357 seconds
Activated Clotting Time: 186 seconds

## 2017-02-25 LAB — TROPONIN I: Troponin I: >65 ng/mL (ref <0.03)

## 2017-02-25 LAB — PLATELET INHIBITION P2Y12: PLATELET FUNCTION P2Y12: 175 [PRU] — AB (ref 194–418)

## 2017-02-25 MED ORDER — ONDANSETRON HCL 4 MG/2ML IJ SOLN: 4.0000 mg | Freq: Once | INTRAMUSCULAR | Status: AC

## 2017-02-25 MED ORDER — FENTANYL CITRATE (PF) 100 MCG/2ML IJ SOLN
25.0000 ug | Freq: Once | INTRAMUSCULAR | Status: AC
  Administered 2017-02-25: 25 ug via INTRAVENOUS
  Filled 2017-02-25: qty 2

## 2017-02-25 MED ORDER — ATROPINE SULFATE 1 MG/10ML IJ SOSY
PREFILLED_SYRINGE | INTRAMUSCULAR | Status: AC
  Filled 2017-02-25: qty 10

## 2017-02-25 MED ORDER — ORAL CARE MOUTH RINSE: 15.0000 mL | Freq: Two times a day (BID) | OROMUCOSAL | Status: DC

## 2017-02-25 MED ORDER — SODIUM CHLORIDE 0.9 % IV BOLUS (SEPSIS)
250.0000 mL | Freq: Once | INTRAVENOUS | Status: AC
  Administered 2017-02-25: 250 mL via INTRAVENOUS

## 2017-02-25 MED ORDER — DOPAMINE-DEXTROSE 3.2-5 MG/ML-% IV SOLN
5.0000 ug/kg/min | INTRAVENOUS | Status: DC
  Administered 2017-02-25: 5 ug/kg/min via INTRAVENOUS
  Administered 2017-02-27: 3 ug/kg/min via INTRAVENOUS
  Filled 2017-02-25: qty 250

## 2017-02-25 MED ORDER — POTASSIUM CHLORIDE 10 MEQ/100ML IV SOLN
10.0000 meq | INTRAVENOUS | Status: AC
  Administered 2017-02-25 (×3): 10 meq via INTRAVENOUS
  Filled 2017-02-25 (×3): qty 100

## 2017-02-25 MED ORDER — DOPAMINE-DEXTROSE 3.2-5 MG/ML-% IV SOLN
INTRAVENOUS | Status: AC
  Filled 2017-02-25: qty 250

## 2017-02-25 MED ORDER — OXYCODONE-ACETAMINOPHEN 5-325 MG PO TABS
1.0000 | ORAL_TABLET | ORAL | Status: DC | PRN
  Administered 2017-02-25 – 2017-02-28 (×2): 1 via ORAL
  Filled 2017-02-25 (×2): qty 1

## 2017-02-25 MED ORDER — SODIUM CHLORIDE 0.9 % IV BOLUS (SEPSIS)
150.0000 mL | Freq: Once | INTRAVENOUS | Status: AC
  Administered 2017-02-25: 150 mL via INTRAVENOUS

## 2017-02-25 MED ORDER — CHLORHEXIDINE GLUCONATE 0.12 % MT SOLN
15.0000 mL | Freq: Two times a day (BID) | OROMUCOSAL | Status: DC
  Administered 2017-02-25: 15 mL via OROMUCOSAL
  Filled 2017-02-25: qty 15

## 2017-02-25 MED ORDER — PANTOPRAZOLE SODIUM 40 MG IV SOLR
40.0000 mg | INTRAVENOUS | Status: DC
  Administered 2017-02-25 – 2017-02-28 (×4): 40 mg via INTRAVENOUS
  Filled 2017-02-25 (×4): qty 40

## 2017-02-25 MED ORDER — SODIUM CHLORIDE 0.9 % IV SOLN
INTRAVENOUS | Status: DC
  Administered 2017-02-25: 23:00:00 via INTRAVENOUS

## 2017-02-25 MED FILL — Clopidogrel Bisulfate Tab 300 MG (Base Equiv): ORAL | Qty: 1 | Status: AC

## 2017-02-25 NOTE — Progress Notes (Signed)
EKG CRITICAL VALUE     12 lead EKG performed.  Critical value noted. [place], RN notified.   SCALES-PRICE,Jolan Mealor, CCT 02/25/2017 8:09 AM

## 2017-02-25 NOTE — Progress Notes (Signed)
CARDIAC REHAB PHASE I   PRE:  Rate/Rhythm: 63 SR c/ PVCs  BP:  Sitting: 81/50        SaO2: 100 2 L  MODE:  Ambulation: 120 ft   POST:  Rate/Rhythm: 81 SR c/ PVCs  BP:  Sitting: 10663         SaO2: 97 2L  Pt in bed, drowsy, BP low, agreeable to walk, stood with minimal assistance. Pt ambulated 120 ft on 2L O2, rolling walker, IV, gait belt, assist x2, slow, mostly steady gait, tolerated fairly well. Pt denies any complaints other than nausea upon return to room. Pt states she has been receiving HHPT, pt would benefit from continued HHPT at discharge to maximize mobility. Pt to recliner after walk, call bell within reach. Will follow as x2 for safety/equipment.   [place], RN, BSN 02/25/2017 2:06 PM

## 2017-02-25 NOTE — Significant Event (Signed)
Dr. Doylene Canard made aware of patient's chest pain. Patient describes it as dull, non-radiating pain. Patient rated it as a 5-6 pain level. Nitroglycerin sublingual given, with minimal relief. Per MD, for RN to put in order for percocet 1 tablet Q4H as needed for pain.   Abigail [name]

## 2017-02-25 NOTE — Significant Event (Signed)
Received a call from lab that sample for platelet inhibition p2y12, sent last night is not enough to run. Lab personnel told RN to redraw sample again. RN placed order in Epic for lab to draw.    Abigail [name]

## 2017-02-25 NOTE — Progress Notes (Signed)
Dr. Doylene Canard notified of no change in BP.  SBP remains in 70's.  Order to get Stat CBC and call results

## 2017-02-25 NOTE — Progress Notes (Signed)
Dr. Doylene Canard notified of BP 73/60 currently.  Order for 150cc NS bolus and if SBP remains low to call back afterwards.

## 2017-02-25 NOTE — Progress Notes (Signed)
Dr. Doylene Canard notified of low BP 73/50.  Orders received for NS bolus 250cc x 1 now.

## 2017-02-25 NOTE — Progress Notes (Signed)
Pt right femora sheath removed at 0549 by Idelia Salm RN and Ezekiel Slocumb RN, emergency equipment at beside. Pressure held for 20 mins, vital signs stable, site is level zero. Pt educated about sheath pull and care afterwards. Primary nurse also educated about sheath pull and protocol post removal. Will be available to assisted primary nurse if needed.

## 2017-02-25 NOTE — Progress Notes (Signed)
Ref: Nolene Ebbs, MD   Subjective:  Low blood pressure. Patient feels fine and says she had good sleep. Mild improvement in mean pressure with 250 cc saline bolus.  Objective:  Vital Signs in the last 24 hours: Temp:  [97.6 F (36.4 C)-98.2 F (36.8 C)] 97.6 F (36.4 C) (05/09 1948) Pulse Rate:  [25-137] 70 (05/09 2215) Cardiac Rhythm: Normal sinus rhythm (05/09 2200) Resp:  [0-30] 19 (05/09 2215) BP: (63-134)/(40-110) 94/70 (05/09 2215) SpO2:  [0 %-100 %] 100 % (05/09 2215) Arterial Line BP: (126-136)/(71-85) 129/74 (05/09 0500) Weight:  [60.8 kg (134 lb)] 60.8 kg (134 lb) (05/09 1700)  Physical Exam: BP Readings from Last 1 Encounters:  02/25/17 94/70    Wt Readings from Last 1 Encounters:  02/25/17 60.8 kg (134 lb)    Weight change:  Body mass index is 22.3 kg/m. HEENT: [place]/AT, Eyes-Brown, PERL, EOMI, Conjunctiva-Pale, Sclera-Non-icteric Neck: No JVD, No bruit, Trachea midline. Lungs:  Clear, Bilateral. Cardiac:  Regular rhythm, normal S1 and S2, no S3. II/VI systolic murmur. Abdomen:  Soft, non-tender. BS present. Extremities:  Trace edema present. No cyanosis. No clubbing. CNS: AxOx3, Cranial nerves grossly intact, moves all 4 extremities.  Skin: Warm and dry.   Intake/Output from previous day: 05/08 0701 - 05/09 0700 In: 124.6 [I.V.:124.6] Out: -     Lab Results: BMET    Component Value Date/Time   NA 138 02/25/2017 0300   NA 137 02/24/2017 2108   NA 143 02/12/2017 1035   NA 145 01/29/2017 1428   NA 147 (H) 01/24/2017 0418   NA 140 12/18/2016 0932   K 3.0 (L) 02/25/2017 0300   K 2.7 (LL) 02/24/2017 2108   K 3.8 02/12/2017 1035   K 3.7 01/29/2017 1428   K 3.8 01/24/2017 0418   K 4.9 12/18/2016 0932   CL 109 02/25/2017 0300   CL 105 02/24/2017 2108   CL 113 (H) 01/24/2017 0418   CO2 18 (L) 02/25/2017 0300   CO2 23 02/12/2017 1035   CO2 25 01/29/2017 1428   CO2 20 (L) 01/24/2017 0418   CO2 21 (L) 01/23/2017 0030   CO2 24 12/18/2016 0932   GLUCOSE 164 (H) 02/25/2017 0300   GLUCOSE 114 (H) 02/24/2017 2108   GLUCOSE 106 02/12/2017 1035   GLUCOSE 127 01/29/2017 1428   GLUCOSE 77 01/24/2017 0418   GLUCOSE 138 12/18/2016 0932   BUN 7 02/25/2017 0300   BUN 9 02/24/2017 2108   BUN 20.6 02/12/2017 1035   BUN 19.1 01/29/2017 1428   BUN 13 01/24/2017 0418   BUN 16.8 12/18/2016 0932   CREATININE 0.70 02/25/2017 0300   CREATININE 0.50 02/24/2017 2108   CREATININE 1.0 02/12/2017 1035   CREATININE 0.9 01/29/2017 1428   CREATININE 0.98 01/24/2017 0418   CREATININE 1.7 (H) 12/18/2016 0932   CALCIUM 7.1 (L) 02/25/2017 0300   CALCIUM 9.3 02/12/2017 1035   CALCIUM 9.2 01/29/2017 1428   CALCIUM 8.9 01/24/2017 0418   CALCIUM 9.2 01/23/2017 0030   CALCIUM 9.9 12/18/2016 0932   GFRNONAA >60 02/25/2017 0300   GFRNONAA 57 (L) 01/24/2017 0418   GFRNONAA 34 (L) 01/23/2017 0030   GFRAA >60 02/25/2017 0300   GFRAA >60 01/24/2017 0418   GFRAA 40 (L) 01/23/2017 0030   CBC    Component Value Date/Time   WBC 12.0 (H) 02/25/2017 1542   RBC 2.98 (L) 02/25/2017 1542   HGB 8.9 (L) 02/25/2017 1542   HGB 10.1 (L) 02/12/2017 1035   HCT 27.5 (L) 02/25/2017  1542   HCT 31.9 (L) 02/12/2017 1035   PLT  02/25/2017 1542    PLATELET CLUMPS NOTED ON SMEAR, COUNT APPEARS ADEQUATE   PLT 258 02/12/2017 1035   MCV 92.3 02/25/2017 1542   MCV 95.8 02/12/2017 1035   MCH 29.9 02/25/2017 1542   MCHC 32.4 02/25/2017 1542   RDW 15.1 02/25/2017 1542   RDW 15.3 (H) 02/12/2017 1035   LYMPHSABS 1.0 02/25/2017 0300   LYMPHSABS 1.0 02/12/2017 1035   MONOABS 0.8 02/25/2017 0300   MONOABS 0.9 02/12/2017 1035   EOSABS 0.0 02/25/2017 0300   EOSABS 0.1 02/12/2017 1035   BASOSABS 0.0 02/25/2017 0300   BASOSABS 0.0 02/12/2017 1035   HEPATIC Function Panel  Recent Labs  01/29/17 1428 01/29/17 1428 02/25/17 0300  PROT 5.7* 5.3* 4.6*   HEMOGLOBIN A1C No components found for: HGA1C,  MPG CARDIAC ENZYMES Lab Results  Component Value Date   CKTOTAL 129  01/01/2012   CKMB 4.6 (H) 01/01/2012   TROPONINI >65.00 (HH) 02/25/2017   TROPONINI 0.08 (HH) 01/22/2017   TROPONINI 0.08 (HH) 01/22/2017   BNP No results for input(s): PROBNP in the last 8760 hours. TSH  Recent Labs  12/06/16 2005 12/18/16 1056 12/22/16 2008  TSH 0.541 0.576 0.481   CHOLESTEROL  Recent Labs  12/07/16 0605 02/24/17 2028  CHOL 220* 196    Scheduled Meds: . acyclovir  400 mg Oral BID  . aspirin  81 mg Oral Daily  . aspirin EC  81 mg Oral Daily  . atorvastatin  80 mg Oral q1800  . calcium carbonate  1 tablet Oral Q breakfast  . chlorhexidine  15 mL Mouth Rinse BID  . cholecalciferol  1,000 Units Oral Daily  . DULoxetine  20 mg Oral Daily  . feeding supplement (ENSURE ENLIVE)  237 mL Oral BID BM  . lenalidomide  15 mg Oral Daily  . [START ON 02/26/2017] mouth rinse  15 mL Mouth Rinse q12n4p  . metoprolol tartrate  12.5 mg Oral BID  . pantoprazole (PROTONIX) IV  40 mg Intravenous Q24H  . pyridostigmine  30 mg Oral BID  . sodium chloride flush  3 mL Intravenous Q12H  . ticagrelor  90 mg Oral BID  . vitamin C  1,000 mg Oral Daily   Continuous Infusions: . sodium chloride 20 mL/hr at 02/25/17 1400  . sodium chloride    . amiodarone Stopped (02/25/17 0330)  . DOPamine 5 mcg/kg/min (02/25/17 2220)  . nitroGLYCERIN Stopped (02/24/17 2049)   PRN Meds:.sodium chloride, acetaminophen, nitroGLYCERIN, ondansetron (ZOFRAN) IV, ondansetron (ZOFRAN) IV, oxyCODONE-acetaminophen, polyethylene glycol, sodium chloride flush  Assessment/Plan: Acute anterior wall MI CAD S/P stent to LAD and LCX Multiple myeloma  IV dopamine with further increase in mean pressure to 74.   LOS: 1 day    [name]  MD  02/25/2017, 10:30 PM

## 2017-02-25 NOTE — Care Management Note (Signed)
Case Management Note Marvetta Gibbons RN, BSN Unit 2W-Case Manager-- Person coverage [phone]  Patient Details  Name: Abigail [name] MRN: [id number] Date of Birth: 09-03-1946  Subjective/Objective:   Pt admitted with acute MI                 Action/Plan: PTA pt lived at home- CM to follow for d/c needs-    Expected Discharge Date:                  Expected Discharge Plan:  Home/Self Care  In-House Referral:     Discharge planning Services  CM Consult  Post Acute Care Choice:    Choice offered to:     DME Arranged:    DME Agency:     HH Arranged:    HH Agency:     Status of Service:  In process, will continue to follow  If discussed at Long Length of Stay Meetings, dates discussed:    Additional Comments:  Dawayne Patricia, RN 02/25/2017, 11:50 AM

## 2017-02-25 NOTE — Telephone Encounter (Signed)
Pt called to cancel appts for Thursday 02/26/17 due to pt is currently at [facility] .  Pt stated she had heart attack last night.

## 2017-02-25 NOTE — Progress Notes (Signed)
Ref: Abigail Ebbs, MD   Subjective:  Nauseated. Had vomiting earlier and had bloody BM last night. Off amiodarone, angiomax (bivalirudin) and aggrastat (Tirofiban). C/O some chest pain. Afebrile.   Objective:  Vital Signs in the last 24 hours: Temp:  [97.8 F (36.6 C)-98.1 F (36.7 C)] 97.8 F (36.6 C) (05/09 0729) Pulse Rate:  [56-114] 95 (05/09 0700) Cardiac Rhythm: Atrial fibrillation (05/09 0400) Resp:  [0-22] 15 (05/09 0900) BP: (70-128)/(46-91) 115/82 (05/09 0900) SpO2:  [0 %-100 %] 96 % (05/09 0900) Arterial Line BP: (126-136)/(71-85) 129/74 (05/09 0500)  Physical Exam: BP Readings from Last 1 Encounters:  02/25/17 115/82    Wt Readings from Last 1 Encounters:  02/12/17 59.8 kg (131 lb 12.8 oz)    Weight change:  There is no height or weight on file to calculate BMI. HEENT: [place]/AT, Eyes-Brown, PERL, EOMI, Conjunctiva-Pale, Sclera-Non-icteric Neck: No JVD, No bruit, Trachea midline. Lungs:  Clearing, Bilateral. Cardiac:  Regular rhythm, normal S1 and S2, no S3. II/VI systolic murmur. Abdomen:  Soft, epigastric tender. BS present. Extremities:  Trace edema present. No cyanosis. No clubbing. CNS: AxOx3, Cranial nerves grossly intact, moves all 4 extremities.  Skin: Warm and dry.   Intake/Output from previous day: 05/08 0701 - 05/09 0700 In: 124.6 [I.V.:124.6] Out: -     Lab Results: BMET    Component Value Date/Time   NA 138 02/25/2017 0300   NA 143 02/12/2017 1035   NA 145 01/29/2017 1428   NA 147 (H) 01/24/2017 0418   NA 143 01/23/2017 0030   NA 140 12/18/2016 0932   K 3.0 (L) 02/25/2017 0300   K 3.8 02/12/2017 1035   K 3.7 01/29/2017 1428   K 3.8 01/24/2017 0418   K 4.0 01/23/2017 0030   K 4.9 12/18/2016 0932   CL 109 02/25/2017 0300   CL 113 (H) 01/24/2017 0418   CL 110 01/23/2017 0030   CO2 18 (L) 02/25/2017 0300   CO2 23 02/12/2017 1035   CO2 25 01/29/2017 1428   CO2 20 (L) 01/24/2017 0418   CO2 21 (L) 01/23/2017 0030   CO2 24 12/18/2016  0932   GLUCOSE 164 (H) 02/25/2017 0300   GLUCOSE 106 02/12/2017 1035   GLUCOSE 127 01/29/2017 1428   GLUCOSE 77 01/24/2017 0418   GLUCOSE 89 01/23/2017 0030   GLUCOSE 138 12/18/2016 0932   BUN 7 02/25/2017 0300   BUN 20.6 02/12/2017 1035   BUN 19.1 01/29/2017 1428   BUN 13 01/24/2017 0418   BUN 23 (H) 01/23/2017 0030   BUN 16.8 12/18/2016 0932   CREATININE 0.70 02/25/2017 0300   CREATININE 1.0 02/12/2017 1035   CREATININE 0.9 01/29/2017 1428   CREATININE 0.98 01/24/2017 0418   CREATININE 1.50 (H) 01/23/2017 0030   CREATININE 1.7 (H) 12/18/2016 0932   CALCIUM 7.1 (L) 02/25/2017 0300   CALCIUM 9.3 02/12/2017 1035   CALCIUM 9.2 01/29/2017 1428   CALCIUM 8.9 01/24/2017 0418   CALCIUM 9.2 01/23/2017 0030   CALCIUM 9.9 12/18/2016 0932   GFRNONAA >60 02/25/2017 0300   GFRNONAA 57 (L) 01/24/2017 0418   GFRNONAA 34 (L) 01/23/2017 0030   GFRAA >60 02/25/2017 0300   GFRAA >60 01/24/2017 0418   GFRAA 40 (L) 01/23/2017 0030   CBC    Component Value Date/Time   WBC 12.1 (H) 02/25/2017 0300   RBC 2.86 (L) 02/25/2017 0300   HGB 8.4 (L) 02/25/2017 0300   HGB 10.1 (L) 02/12/2017 1035   HCT 26.2 (L) 02/25/2017 0300  HCT 31.9 (L) 02/12/2017 1035   PLT 248 02/25/2017 0300   PLT 258 02/12/2017 1035   MCV 91.6 02/25/2017 0300   MCV 95.8 02/12/2017 1035   MCH 29.4 02/25/2017 0300   MCHC 32.1 02/25/2017 0300   RDW 14.8 02/25/2017 0300   RDW 15.3 (H) 02/12/2017 1035   LYMPHSABS 1.0 02/25/2017 0300   LYMPHSABS 1.0 02/12/2017 1035   MONOABS 0.8 02/25/2017 0300   MONOABS 0.9 02/12/2017 1035   EOSABS 0.0 02/25/2017 0300   EOSABS 0.1 02/12/2017 1035   BASOSABS 0.0 02/25/2017 0300   BASOSABS 0.0 02/12/2017 1035   HEPATIC Function Panel  Recent Labs  01/29/17 1428 01/29/17 1428 02/25/17 0300  PROT 5.7* 5.3* 4.6*   HEMOGLOBIN A1C No components found for: HGA1C,  MPG CARDIAC ENZYMES Lab Results  Component Value Date   CKTOTAL 129 01/01/2012   CKMB 4.6 (H) 01/01/2012    TROPONINI >65.00 (HH) 02/25/2017   TROPONINI 0.08 (HH) 01/22/2017   TROPONINI 0.08 (HH) 01/22/2017   BNP No results for input(s): PROBNP in the last 8760 hours. TSH  Recent Labs  12/06/16 2005 12/18/16 1056 12/22/16 2008  TSH 0.541 0.576 0.481   CHOLESTEROL  Recent Labs  12/07/16 0605 02/24/17 2028  CHOL 220* 196    Scheduled Meds: . acyclovir  400 mg Oral BID  . aspirin  81 mg Oral Daily  . aspirin EC  81 mg Oral Daily  . atorvastatin  80 mg Oral q1800  . atropine      . calcium carbonate  1 tablet Oral Q breakfast  . cholecalciferol  1,000 Units Oral Daily  . DULoxetine  20 mg Oral Daily  . feeding supplement (ENSURE ENLIVE)  237 mL Oral BID BM  . lenalidomide  15 mg Oral Daily  . metoprolol tartrate  12.5 mg Oral BID  . pantoprazole  40 mg Oral BID AC  . pyridostigmine  30 mg Oral BID  . sodium chloride flush  3 mL Intravenous Q12H  . ticagrelor  90 mg Oral BID  . vitamin C  1,000 mg Oral Daily   Continuous Infusions: . sodium chloride 10 mL/hr at 02/25/17 0000  . sodium chloride    . sodium chloride 1 mL/kg/hr (02/25/17 0000)  . amiodarone Stopped (02/25/17 0330)  . nitroGLYCERIN Stopped (02/24/17 2049)  . potassium chloride     PRN Meds:.sodium chloride, acetaminophen, nitroGLYCERIN, ondansetron (ZOFRAN) IV, ondansetron (ZOFRAN) IV, polyethylene glycol, sodium chloride flush  Assessment/Plan: Acute anterolateral MI CAD, multivessel S/P Stent to LAD and LCX Hypertension Hyperlipidemia H/O bilateral renal stenting in past Multiple myeloma Abdominal pain GERD H/O peptic ulcer Wandering pacemaker rhythm to multifocal atrial tachycardia  Monitor H/H and GI bleed. Change Protonix and potassium to IV.   LOS: 1 day    [name]  MD  02/25/2017, 9:27 AM

## 2017-02-26 ENCOUNTER — Ambulatory Visit: Payer: Medicare HMO | Admitting: Nurse Practitioner

## 2017-02-26 ENCOUNTER — Inpatient Hospital Stay (HOSPITAL_COMMUNITY): Payer: Medicare HMO

## 2017-02-26 ENCOUNTER — Other Ambulatory Visit: Payer: Medicare HMO

## 2017-02-26 LAB — PREPARE RBC (CROSSMATCH)

## 2017-02-26 LAB — CBC
HCT: 26.7 % — ABNORMAL LOW (ref 36.0–46.0)
Hemoglobin: 8.5 g/dL — ABNORMAL LOW (ref 12.0–15.0)
MCH: 29.6 pg (ref 26.0–34.0)
MCHC: 31.8 g/dL (ref 30.0–36.0)
MCV: 93 fL (ref 78.0–100.0)
PLATELETS: 263 10*3/uL (ref 150–400)
RBC: 2.87 MIL/uL — AB (ref 3.87–5.11)
RDW: 15.2 % (ref 11.5–15.5)
WBC: 10.7 10*3/uL — ABNORMAL HIGH (ref 4.0–10.5)

## 2017-02-26 LAB — BASIC METABOLIC PANEL
Anion gap: 9 (ref 5–15)
BUN: 8 mg/dL (ref 6–20)
CO2: 20 mmol/L — ABNORMAL LOW (ref 22–32)
CREATININE: 0.76 mg/dL (ref 0.44–1.00)
Calcium: 7.4 mg/dL — ABNORMAL LOW (ref 8.9–10.3)
Chloride: 111 mmol/L (ref 101–111)
GFR calc Af Amer: >60 mL/min (ref >60)
Glucose, Bld: 96 mg/dL (ref 65–99)
Potassium: 3.4 mmol/L — ABNORMAL LOW (ref 3.5–5.1)
SODIUM: 140 mmol/L (ref 135–145)

## 2017-02-26 LAB — MAGNESIUM: MAGNESIUM: 1.2 mg/dL — AB (ref 1.7–2.4)

## 2017-02-26 MED ORDER — SODIUM CHLORIDE 0.9 % IV BOLUS (SEPSIS)
250.0000 mL | Freq: Once | INTRAVENOUS | Status: AC
  Administered 2017-02-26: 250 mL via INTRAVENOUS

## 2017-02-26 MED ORDER — SODIUM CHLORIDE 0.9% FLUSH: 10.0000 mL | INTRAVENOUS | Status: DC | PRN

## 2017-02-26 MED ORDER — MAGNESIUM SULFATE 2 GM/50ML IV SOLN
2.0000 g | Freq: Once | INTRAVENOUS | Status: AC
  Administered 2017-02-26: 2 g via INTRAVENOUS

## 2017-02-26 MED ORDER — POTASSIUM CHLORIDE 10 MEQ/100ML IV SOLN
INTRAVENOUS | Status: AC
  Filled 2017-02-26: qty 200

## 2017-02-26 MED ORDER — SODIUM CHLORIDE 0.9% FLUSH
10.0000 mL | Freq: Two times a day (BID) | INTRAVENOUS | Status: DC
  Administered 2017-02-26 – 2017-03-01 (×6): 10 mL

## 2017-02-26 MED ORDER — POTASSIUM CHLORIDE 10 MEQ/100ML IV SOLN
10.0000 meq | INTRAVENOUS | Status: AC
  Administered 2017-02-26: 10 meq via INTRAVENOUS

## 2017-02-26 MED ORDER — MAGNESIUM SULFATE 2 GM/50ML IV SOLN
INTRAVENOUS | Status: AC
  Filled 2017-02-26: qty 50

## 2017-02-26 MED ORDER — SODIUM CHLORIDE 0.9 % IV SOLN
1.0000 g | Freq: Once | INTRAVENOUS | Status: AC
  Administered 2017-02-26: 1 g via INTRAVENOUS
  Filled 2017-02-26: qty 10

## 2017-02-26 MED ORDER — DEXAMETHASONE 6 MG PO TABS
20.0000 mg | ORAL_TABLET | ORAL | Status: DC
  Administered 2017-02-26: 22:00:00 20 mg via ORAL
  Filled 2017-02-26: qty 1

## 2017-02-26 MED ORDER — DEXTROSE 5 % IV SOLN
1.0000 g | INTRAVENOUS | Status: DC
  Administered 2017-02-26 – 2017-03-01 (×4): 1 g via INTRAVENOUS
  Filled 2017-02-26 (×4): qty 10

## 2017-02-26 MED ORDER — SODIUM CHLORIDE 0.9 % IV BOLUS (SEPSIS): 250.0000 mL | Freq: Once | INTRAVENOUS | Status: DC

## 2017-02-26 MED ORDER — CHLORHEXIDINE GLUCONATE CLOTH 2 % EX PADS
6.0000 | MEDICATED_PAD | Freq: Every day | CUTANEOUS | Status: DC
  Administered 2017-02-27 – 2017-02-28 (×2): 6 via TOPICAL

## 2017-02-26 MED ORDER — ENSURE ENLIVE PO LIQD
237.0000 mL | Freq: Three times a day (TID) | ORAL | Status: DC
  Administered 2017-02-27 – 2017-02-28 (×3): 237 mL via ORAL

## 2017-02-26 NOTE — Progress Notes (Signed)
Dear Doctor: [name]  This patient has been identified as a candidate for PICC for the following reason (s): IV therapy over 48 hours and poor veins/poor circulatory system (CHF, COPD, emphysema, diabetes, steroid use, IV drug abuse, etc.) If you agree, please write an order for the indicated device. For any questions contact the Vascular Access Team at [phone] if no answer, please leave a message.  Thank you for supporting the early vascular access assessment program.

## 2017-02-26 NOTE — Progress Notes (Signed)
  Echocardiogram 2D Echocardiogram has been performed.  Abigail [name] 02/26/2017, 12:44 PM

## 2017-02-26 NOTE — Progress Notes (Signed)
Ref: Nolene Ebbs, MD   Subjective:  T max 99.6. PCXR suggestive of bibasilar infiltrates or atelectasis. Patient denies cough.   Objective:  Vital Signs in the last 24 hours: Temp:  [97.6 F (36.4 C)-99.6 F (37.6 C)] 99.6 F (37.6 C) (05/10 0400) Pulse Rate:  [25-181] 181 (05/10 0520) Cardiac Rhythm: Normal sinus rhythm (05/10 0500) Resp:  [7-33] 19 (05/10 0525) BP: (51-134)/(40-110) 106/64 (05/10 0525) SpO2:  [69 %-100 %] 100 % (05/10 0520) Weight:  [60.8 kg (134 lb)] 60.8 kg (134 lb) (05/09 1700)  Physical Exam: BP Readings from Last 1 Encounters:  02/26/17 106/64    Wt Readings from Last 1 Encounters:  02/25/17 60.8 kg (134 lb)    Weight change:  Body mass index is 22.3 kg/m. HEENT: [place]/AT, Eyes-Brown, PERL, EOMI, Conjunctiva-Pale, Sclera-Non-icteric Neck: No JVD, No bruit, Trachea midline. Lungs:  Clear, Bilateral. Cardiac:  Regular rhythm, normal S1 and S2, no S3. II/VI systolic murmur. Abdomen:  Soft, non-tender. BS present. Extremities:  Trace edema present. No cyanosis. No clubbing. CNS: AxOx3, Cranial nerves grossly intact, moves all 4 extremities.  Skin: Warm and dry.   Intake/Output from previous day: 05/09 0701 - 05/10 0700 In: 3077.7 [P.O.:360; I.V.:1367.7; IV Piggyback:1350] Out: 4481 [Urine:1575; Blood:1]    Lab Results: BMET    Component Value Date/Time   NA 140 02/26/2017 0222   NA 138 02/25/2017 0300   NA 137 02/24/2017 2108   NA 143 02/12/2017 1035   NA 145 01/29/2017 1428   NA 140 12/18/2016 0932   K 3.4 (L) 02/26/2017 0222   K 3.0 (L) 02/25/2017 0300   K 2.7 (LL) 02/24/2017 2108   K 3.8 02/12/2017 1035   K 3.7 01/29/2017 1428   K 4.9 12/18/2016 0932   CL 111 02/26/2017 0222   CL 109 02/25/2017 0300   CL 105 02/24/2017 2108   CO2 20 (L) 02/26/2017 0222   CO2 18 (L) 02/25/2017 0300   CO2 23 02/12/2017 1035   CO2 25 01/29/2017 1428   CO2 20 (L) 01/24/2017 0418   CO2 24 12/18/2016 0932   GLUCOSE 96 02/26/2017 0222   GLUCOSE 164  (H) 02/25/2017 0300   GLUCOSE 114 (H) 02/24/2017 2108   GLUCOSE 106 02/12/2017 1035   GLUCOSE 127 01/29/2017 1428   GLUCOSE 138 12/18/2016 0932   BUN 8 02/26/2017 0222   BUN 7 02/25/2017 0300   BUN 9 02/24/2017 2108   BUN 20.6 02/12/2017 1035   BUN 19.1 01/29/2017 1428   BUN 16.8 12/18/2016 0932   CREATININE 0.76 02/26/2017 0222   CREATININE 0.70 02/25/2017 0300   CREATININE 0.50 02/24/2017 2108   CREATININE 1.0 02/12/2017 1035   CREATININE 0.9 01/29/2017 1428   CREATININE 1.7 (H) 12/18/2016 0932   CALCIUM 7.4 (L) 02/26/2017 0222   CALCIUM 7.1 (L) 02/25/2017 0300   CALCIUM 9.3 02/12/2017 1035   CALCIUM 9.2 01/29/2017 1428   CALCIUM 8.9 01/24/2017 0418   CALCIUM 9.9 12/18/2016 0932   GFRNONAA >60 02/26/2017 0222   GFRNONAA >60 02/25/2017 0300   GFRNONAA 57 (L) 01/24/2017 0418   GFRAA >60 02/26/2017 0222   GFRAA >60 02/25/2017 0300   GFRAA >60 01/24/2017 0418   CBC    Component Value Date/Time   WBC 10.7 (H) 02/26/2017 0222   RBC 2.87 (L) 02/26/2017 0222   HGB 8.5 (L) 02/26/2017 0222   HGB 10.1 (L) 02/12/2017 1035   HCT 26.7 (L) 02/26/2017 0222   HCT 31.9 (L) 02/12/2017 1035   PLT 263  02/26/2017 0222   PLT 258 02/12/2017 1035   MCV 93.0 02/26/2017 0222   MCV 95.8 02/12/2017 1035   MCH 29.6 02/26/2017 0222   MCHC 31.8 02/26/2017 0222   RDW 15.2 02/26/2017 0222   RDW 15.3 (H) 02/12/2017 1035   LYMPHSABS 1.0 02/25/2017 0300   LYMPHSABS 1.0 02/12/2017 1035   MONOABS 0.8 02/25/2017 0300   MONOABS 0.9 02/12/2017 1035   EOSABS 0.0 02/25/2017 0300   EOSABS 0.1 02/12/2017 1035   BASOSABS 0.0 02/25/2017 0300   BASOSABS 0.0 02/12/2017 1035   HEPATIC Function Panel  Recent Labs  01/29/17 1428 01/29/17 1428 02/25/17 0300  PROT 5.7* 5.3* 4.6*   HEMOGLOBIN A1C No components found for: HGA1C,  MPG CARDIAC ENZYMES Lab Results  Component Value Date   CKTOTAL 129 01/01/2012   CKMB 4.6 (H) 01/01/2012   TROPONINI >65.00 (HH) 02/25/2017   TROPONINI 0.08 (HH)  01/22/2017   TROPONINI 0.08 (HH) 01/22/2017   BNP No results for input(s): PROBNP in the last 8760 hours. TSH  Recent Labs  12/06/16 2005 12/18/16 1056 12/22/16 2008  TSH 0.541 0.576 0.481   CHOLESTEROL  Recent Labs  12/07/16 0605 02/24/17 2028  CHOL 220* 196    Scheduled Meds: . acyclovir  400 mg Oral BID  . aspirin  81 mg Oral Daily  . aspirin EC  81 mg Oral Daily  . atorvastatin  80 mg Oral q1800  . calcium carbonate  1 tablet Oral Q breakfast  . chlorhexidine  15 mL Mouth Rinse BID  . cholecalciferol  1,000 Units Oral Daily  . DULoxetine  20 mg Oral Daily  . feeding supplement (ENSURE ENLIVE)  237 mL Oral BID BM  . lenalidomide  15 mg Oral Daily  . mouth rinse  15 mL Mouth Rinse q12n4p  . pantoprazole (PROTONIX) IV  40 mg Intravenous Q24H  . pyridostigmine  30 mg Oral BID  . sodium chloride flush  3 mL Intravenous Q12H  . ticagrelor  90 mg Oral BID  . vitamin C  1,000 mg Oral Daily   Continuous Infusions: . sodium chloride 20 mL/hr at 02/25/17 1400  . sodium chloride 125 mL/hr at 02/26/17 0513  . cefTRIAXone (ROCEPHIN)  IV    . DOPamine 5 mcg/kg/min (02/26/17 0513)  . magnesium sulfate 1 - 4 g bolus IVPB 2 g (02/26/17 0513)  . nitroGLYCERIN Stopped (02/24/17 2049)  . potassium chloride    . [COMPLETED] sodium chloride     PRN Meds:.acetaminophen, nitroGLYCERIN, ondansetron (ZOFRAN) IV, ondansetron (ZOFRAN) IV, oxyCODONE-acetaminophen, polyethylene glycol, sodium chloride flush  Assessment/Plan: Acute anterior wall MI Possible pneumonia r/o sepsis Hypokalemia Hypomagnesemia Hypoalbuminemia CAD S/P LAD and LCX stents Multiple myeloma  Blood cultures x 2 IV antibiotic    LOS: 2 days    [name]  MD  02/26/2017, 5:44 AM

## 2017-02-26 NOTE — Progress Notes (Signed)
Peripherally Inserted Central Catheter/Midline Placement  The IV Nurse has discussed with the patient and/or persons authorized to consent for the patient, the purpose of this procedure and the potential benefits and risks involved with this procedure.  The benefits include less needle sticks, lab draws from the catheter, and the patient may be discharged home with the catheter. Risks include, but not limited to, infection, bleeding, blood clot (thrombus formation), and puncture of an artery; nerve damage and irregular heartbeat and possibility to perform a PICC exchange if needed/ordered by physician.  Alternatives to this procedure were also discussed.  B[facility] Power PICC patient education guide, fact sheet on infection prevention and patient information card has been provided to patient /or left at bedside.    PICC/Midline Placement Documentation        Abigail [name] 02/26/2017, 2:25 PM

## 2017-02-26 NOTE — Progress Notes (Signed)
Per Chester Holstein, RN MD to order PICC and does not want second assess for PIV done by [facility].  Carolee Rota, RN

## 2017-02-26 NOTE — Progress Notes (Signed)
Initial Nutrition Assessment  DOCUMENTATION CODES:   Severe malnutrition in context of chronic illness  INTERVENTION:   Ensure Enlive po TID, each supplement provides 350 kcal and 20 grams of protein  Encouraged PO intake at meals.   NUTRITION DIAGNOSIS:   Malnutrition (Severe) related to chronic illness (multiple myeloma) as evidenced by mild depletion of body fat, severe depletion of muscle mass, energy intake < or equal to 75% for > or equal to 1 month, 30 percent weight loss x 1 year.  GOAL:   Patient will meet greater than or equal to 90% of their needs  MONITOR:   PO intake, Supplement acceptance, Weight trends  REASON FOR ASSESSMENT:   Malnutrition Screening Tool    ASSESSMENT:   Pt with hx of multiple myeloma admitted with STEMI   Pt with 30% weight loss x 1 year. She weighed 191 lb 5/17 and has been losing weight and feels that her weight has not stabilized during this time.  She reports having recently started a medication for appetite, only med noted on PTA is remeron.  Meal intake: Breakfast 0, Lunch: frozen dinner, Dinner: either a home cooked meal or a frozen dinner. She does not always eat dinner. She lives at home alone. She reports very poor appetite but drinking 3 ensures at home each day.   Nutrition-Focused physical exam completed. Findings are mild fat depletion, severe muscle depletion, and no edema.    Medications reviewed and include: oscal, vitamin D, ensure enlive, vitamin C  Labs reviewed: K+ 3.4, magnesium 1.2   Diet Order:  Diet Heart Room service appropriate? Yes; Fluid consistency: Thin  Skin:  Reviewed, no issues  Last BM:  5/9  Height:   Ht Readings from Last 1 Encounters:  02/25/17 _0  (1.651 m)    Weight:   Wt Readings from Last 1 Encounters:  02/25/17 134 lb (60.8 kg)    Ideal Body Weight:  56.8 kg  BMI:  Body mass index is 22.3 kg/m.  Estimated Nutritional Needs:   Kcal:  1700-1900  Protein:  85-100  grams  Fluid:  > 1.7 L/day  EDUCATION NEEDS:   Education needs addressed  Maylon Peppers RD, [place], [place] Pager [phone] After Hours Pager

## 2017-02-27 LAB — COMPREHENSIVE METABOLIC PANEL
ALBUMIN: 2.4 g/dL — AB (ref 3.5–5.0)
ALK PHOS: 93 U/L (ref 38–126)
ALT: 19 U/L (ref 14–54)
ANION GAP: 10 (ref 5–15)
AST: 72 U/L — ABNORMAL HIGH (ref 15–41)
BUN: 5 mg/dL — ABNORMAL LOW (ref 6–20)
CALCIUM: 7.7 mg/dL — AB (ref 8.9–10.3)
CHLORIDE: 109 mmol/L (ref 101–111)
CO2: 21 mmol/L — AB (ref 22–32)
Creatinine, Ser: 0.72 mg/dL (ref 0.44–1.00)
GFR calc non Af Amer: >60 mL/min (ref >60)
GLUCOSE: 123 mg/dL — AB (ref 65–99)
POTASSIUM: 3.1 mmol/L — AB (ref 3.5–5.1)
SODIUM: 140 mmol/L (ref 135–145)
Total Bilirubin: 1.2 mg/dL (ref 0.3–1.2)
Total Protein: 4.8 g/dL — ABNORMAL LOW (ref 6.5–8.1)

## 2017-02-27 LAB — CBC
HEMATOCRIT: 29.2 % — AB (ref 36.0–46.0)
Hemoglobin: 9.6 g/dL — ABNORMAL LOW (ref 12.0–15.0)
MCH: 29.3 pg (ref 26.0–34.0)
MCHC: 32.9 g/dL (ref 30.0–36.0)
MCV: 89 fL (ref 78.0–100.0)
PLATELETS: 261 10*3/uL (ref 150–400)
RBC: 3.28 MIL/uL — ABNORMAL LOW (ref 3.87–5.11)
RDW: 16.4 % — AB (ref 11.5–15.5)
WBC: 10.8 10*3/uL — ABNORMAL HIGH (ref 4.0–10.5)

## 2017-02-27 MED ORDER — AMIODARONE HCL 200 MG PO TABS
200.0000 mg | ORAL_TABLET | Freq: Two times a day (BID) | ORAL | Status: DC
  Administered 2017-02-27 – 2017-02-28 (×4): 200 mg via ORAL
  Filled 2017-02-27 (×4): qty 1

## 2017-02-27 MED ORDER — MIDAZOLAM HCL 2 MG/2ML IJ SOLN: 1.0000 mg | Freq: Once | INTRAMUSCULAR | Status: DC | PRN

## 2017-02-27 MED ORDER — POTASSIUM CHLORIDE IN NACL 40-0.9 MEQ/L-% IV SOLN
INTRAVENOUS | Status: DC
  Administered 2017-02-27 – 2017-02-28 (×2): 50 mL/h via INTRAVENOUS
  Filled 2017-02-27 (×2): qty 1000

## 2017-02-27 MED ORDER — POTASSIUM CHLORIDE CRYS ER 10 MEQ PO TBCR
10.0000 meq | EXTENDED_RELEASE_TABLET | Freq: Four times a day (QID) | ORAL | Status: AC
  Administered 2017-02-27 (×4): 10 meq via ORAL
  Filled 2017-02-27 (×4): qty 1

## 2017-02-27 NOTE — Progress Notes (Signed)
CARDIAC REHAB PHASE I   PRE:  Rate/Rhythm: 83 SR lots of PACs  BP:  Supine:   Sitting: 101/68  Standing:    SaO2: 93-98%RA  MODE:  Ambulation: 310 ft   POST:  Rate/Rhythm: 107 st lots of PACs  BP:  Supine:   Sitting: 117/73  Standing:    SaO2: 100%RA 1345-1410 Pt walked 310 ft on RA with rolling walker and asst x 2. Tolerated well. No CP. Pt stated second walk today. To recliner after walk with call bell. Discussed CRP 2 and will refer to [place]. Stressed importance of brilinta with stent. Discussed MI restrictions.    Graylon Good, RN BSN  02/27/2017 2:06 PM

## 2017-02-27 NOTE — Progress Notes (Signed)
Pt had 1 minute episode of V-tach, HR in 200s; pt denies any symptoms. Pt had earlier episode of non-sustained V-tach at 0045, asymptomatic. Paged on call cardiologist Dr. Raiford Simmonds. MD advised to place pt on Zoll pads and shock if pt had another episode that lasted >2 minutes or if pt became symptomatic. Verbal order received to give 1 mg versed PRN if pt remains conscious. Pt dopamine decreased to 4 mcg. Pt placed on Zoll pads at approximately 0150. Will continue to closely monitor pt.  Sherlie Ban, RN

## 2017-02-27 NOTE — Progress Notes (Signed)
Ref: Fleet Contras, MD   Subjective:  Had episode of SVT, lasting for 1 minute. Asymptomatic. Has hypokalemia and dopamine drip.  Objective:  Vital Signs in the last 24 hours: Temp:  [98.4 F (36.9 C)-100.2 F (37.9 C)] 98.9 F (37.2 C) (05/11 0758) Pulse Rate:  [35-149] 104 (05/11 0900) Cardiac Rhythm: Other (Comment) (05/11 0400) Resp:  [14-27] 15 (05/11 0900) BP: (67-127)/(36-90) 118/85 (05/11 0900) SpO2:  [90 %-100 %] 99 % (05/11 0900)  Physical Exam: BP Readings from Last 1 Encounters:  02/27/17 118/85    Wt Readings from Last 1 Encounters:  02/25/17 60.8 kg (134 lb)    Weight change:  Body mass index is 22.3 kg/m. HEENT: /AT, Eyes-Brown, PERL, EOMI, Conjunctiva-Pale pink, Sclera-Non-icteric Neck: No JVD, No bruit, Trachea midline. Lungs:  Clear, Bilateral. Cardiac:  Regular rhythm, normal S1 and S2, no S3. II/VI systolic murmur. Abdomen:  Soft, non-tender. BS present. Extremities:  No edema present. No cyanosis. No clubbing. CNS: AxOx3, Cranial nerves grossly intact, moves all 4 extremities.  Skin: Warm and dry.   Intake/Output from previous day: 05/10 0701 - 05/11 0700 In: 2734.5 [I.V.:1769.5; Blood:665; IV Piggyback:300] Out: 1625 [Urine:1625]    Lab Results: BMET    Component Value Date/Time   NA 140 02/27/2017 0230   NA 140 02/26/2017 0222   NA 138 02/25/2017 0300   NA 143 02/12/2017 1035   NA 145 01/29/2017 1428   NA 140 12/18/2016 0932   K 3.1 (L) 02/27/2017 0230   K 3.4 (L) 02/26/2017 0222   K 3.0 (L) 02/25/2017 0300   K 3.8 02/12/2017 1035   K 3.7 01/29/2017 1428   K 4.9 12/18/2016 0932   CL 109 02/27/2017 0230   CL 111 02/26/2017 0222   CL 109 02/25/2017 0300   CO2 21 (L) 02/27/2017 0230   CO2 20 (L) 02/26/2017 0222   CO2 18 (L) 02/25/2017 0300   CO2 23 02/12/2017 1035   CO2 25 01/29/2017 1428   CO2 24 12/18/2016 0932   GLUCOSE 123 (H) 02/27/2017 0230   GLUCOSE 96 02/26/2017 0222   GLUCOSE 164 (H) 02/25/2017 0300   GLUCOSE 106  02/12/2017 1035   GLUCOSE 127 01/29/2017 1428   GLUCOSE 138 12/18/2016 0932   BUN 5 (L) 02/27/2017 0230   BUN 8 02/26/2017 0222   BUN 7 02/25/2017 0300   BUN 20.6 02/12/2017 1035   BUN 19.1 01/29/2017 1428   BUN 16.8 12/18/2016 0932   CREATININE 0.72 02/27/2017 0230   CREATININE 0.76 02/26/2017 0222   CREATININE 0.70 02/25/2017 0300   CREATININE 1.0 02/12/2017 1035   CREATININE 0.9 01/29/2017 1428   CREATININE 1.7 (H) 12/18/2016 0932   CALCIUM 7.7 (L) 02/27/2017 0230   CALCIUM 7.4 (L) 02/26/2017 0222   CALCIUM 7.1 (L) 02/25/2017 0300   CALCIUM 9.3 02/12/2017 1035   CALCIUM 9.2 01/29/2017 1428   CALCIUM 9.9 12/18/2016 0932   GFRNONAA >60 02/27/2017 0230   GFRNONAA >60 02/26/2017 0222   GFRNONAA >60 02/25/2017 0300   GFRAA >60 02/27/2017 0230   GFRAA >60 02/26/2017 0222   GFRAA >60 02/25/2017 0300   CBC    Component Value Date/Time   WBC 10.8 (H) 02/27/2017 0230   RBC 3.28 (L) 02/27/2017 0230   HGB 9.6 (L) 02/27/2017 0230   HGB 10.1 (L) 02/12/2017 1035   HCT 29.2 (L) 02/27/2017 0230   HCT 31.9 (L) 02/12/2017 1035   PLT 261 02/27/2017 0230   PLT 258 02/12/2017 1035   MCV 89.0  02/27/2017 0230   MCV 95.8 02/12/2017 1035   MCH 29.3 02/27/2017 0230   MCHC 32.9 02/27/2017 0230   RDW 16.4 (H) 02/27/2017 0230   RDW 15.3 (H) 02/12/2017 1035   LYMPHSABS 1.0 02/25/2017 0300   LYMPHSABS 1.0 02/12/2017 1035   MONOABS 0.8 02/25/2017 0300   MONOABS 0.9 02/12/2017 1035   EOSABS 0.0 02/25/2017 0300   EOSABS 0.1 02/12/2017 1035   BASOSABS 0.0 02/25/2017 0300   BASOSABS 0.0 02/12/2017 1035   HEPATIC Function Panel  Recent Labs  01/29/17 1428 02/25/17 0300 02/27/17 0230  PROT 5.3* 4.6* 4.8*   HEMOGLOBIN A1C No components found for: HGA1C,  MPG CARDIAC ENZYMES Lab Results  Component Value Date   CKTOTAL 129 01/01/2012   CKMB 4.6 (H) 01/01/2012   TROPONINI >65.00 (HH) 02/25/2017   TROPONINI 0.08 (HH) 01/22/2017   TROPONINI 0.08 (HH) 01/22/2017   BNP No results for  input(s): PROBNP in the last 8760 hours. TSH  Recent Labs  12/06/16 2005 12/18/16 1056 12/22/16 2008  TSH 0.541 0.576 0.481   CHOLESTEROL  Recent Labs  12/07/16 0605 02/24/17 2028  CHOL 220* 196    Scheduled Meds: . acyclovir  400 mg Oral BID  . amiodarone  200 mg Oral BID  . aspirin EC  81 mg Oral Daily  . atorvastatin  80 mg Oral q1800  . calcium carbonate  1 tablet Oral Q breakfast  . Chlorhexidine Gluconate Cloth  6 each Topical Daily  . cholecalciferol  1,000 Units Oral Daily  . dexamethasone  20 mg Oral Weekly  . DULoxetine  20 mg Oral Daily  . feeding supplement (ENSURE ENLIVE)  237 mL Oral TID BM  . pantoprazole (PROTONIX) IV  40 mg Intravenous Q24H  . pyridostigmine  30 mg Oral BID  . sodium chloride flush  10-40 mL Intracatheter Q12H  . sodium chloride flush  3 mL Intravenous Q12H  . ticagrelor  90 mg Oral BID  . vitamin C  1,000 mg Oral Daily   Continuous Infusions: . sodium chloride 20 mL/hr at 02/25/17 1400  . 0.9 % NaCl with KCl 40 mEq / L    . cefTRIAXone (ROCEPHIN)  IV Stopped (02/27/17 0530)  . DOPamine 2 mcg/kg/min (02/27/17 0921)  . nitroGLYCERIN Stopped (02/24/17 2049)   PRN Meds:.acetaminophen, midazolam, nitroGLYCERIN, ondansetron (ZOFRAN) IV, oxyCODONE-acetaminophen, polyethylene glycol, sodium chloride flush, sodium chloride flush  Assessment/Plan: Acute anterior wall MI SVT secondary to dopamine use Possible pneumonia Hypokalemia Hypomagnesemia-corrected Hypoalbuminemia Anemia of chronic disease and blood loss S/P LAD and LCX stent Multiple myeloma s/p chemotherapy Chronic nausea/vomiting  Potassium supplement Decrease dopamine to wean off. Add amiodarone Increase activity.   LOS: 3 days    [name]  MD  02/27/2017, 9:39 AM

## 2017-02-28 LAB — ECHOCARDIOGRAM COMPLETE
HEIGHTINCHES: 65 in
WEIGHTICAEL: 2144 [oz_av]

## 2017-02-28 LAB — COMPREHENSIVE METABOLIC PANEL
ALBUMIN: 2.2 g/dL — AB (ref 3.5–5.0)
ALK PHOS: 72 U/L (ref 38–126)
ALT: 15 U/L (ref 14–54)
ANION GAP: 6 (ref 5–15)
AST: 34 U/L (ref 15–41)
BUN: 9 mg/dL (ref 6–20)
CALCIUM: 7.7 mg/dL — AB (ref 8.9–10.3)
CHLORIDE: 114 mmol/L — AB (ref 101–111)
CO2: 21 mmol/L — AB (ref 22–32)
CREATININE: 0.71 mg/dL (ref 0.44–1.00)
GFR calc Af Amer: >60 mL/min (ref >60)
GFR calc non Af Amer: >60 mL/min (ref >60)
GLUCOSE: 94 mg/dL (ref 65–99)
Potassium: 4.6 mmol/L (ref 3.5–5.1)
SODIUM: 141 mmol/L (ref 135–145)
Total Bilirubin: 0.7 mg/dL (ref 0.3–1.2)
Total Protein: 4.3 g/dL — ABNORMAL LOW (ref 6.5–8.1)

## 2017-02-28 LAB — CBC WITH DIFFERENTIAL/PLATELET
BASOS PCT: 0 %
Basophils Absolute: 0 10*3/uL (ref 0.0–0.1)
EOS ABS: 0 10*3/uL (ref 0.0–0.7)
EOS PCT: 0 %
HCT: 26.5 % — ABNORMAL LOW (ref 36.0–46.0)
HEMOGLOBIN: 8.6 g/dL — AB (ref 12.0–15.0)
LYMPHS PCT: 16 %
Lymphs Abs: 1.8 10*3/uL (ref 0.7–4.0)
MCH: 29.1 pg (ref 26.0–34.0)
MCHC: 32.5 g/dL (ref 30.0–36.0)
MCV: 89.5 fL (ref 78.0–100.0)
Monocytes Absolute: 0.9 10*3/uL (ref 0.1–1.0)
Monocytes Relative: 8 %
NEUTROS ABS: 8.6 10*3/uL — AB (ref 1.7–7.7)
Neutrophils Relative %: 76 %
PLATELETS: 249 10*3/uL (ref 150–400)
RBC: 2.96 MIL/uL — ABNORMAL LOW (ref 3.87–5.11)
RDW: 17.1 % — ABNORMAL HIGH (ref 11.5–15.5)
WBC: 11.3 10*3/uL — ABNORMAL HIGH (ref 4.0–10.5)

## 2017-02-28 LAB — PLATELET INHIBITION P2Y12: PLATELET FUNCTION P2Y12: 9 [PRU] — AB (ref 194–418)

## 2017-02-28 MED ORDER — TRAMADOL HCL 50 MG PO TABS
50.0000 mg | ORAL_TABLET | Freq: Once | ORAL | Status: AC
  Administered 2017-02-28: 50 mg via ORAL
  Filled 2017-02-28: qty 1

## 2017-02-28 MED ORDER — DIGOXIN 0.25 MG/ML IJ SOLN
0.2500 mg | Freq: Every day | INTRAMUSCULAR | Status: DC
Start: 2017-02-28 — End: 2017-03-01
  Administered 2017-02-28: 0.25 mg via INTRAVENOUS
  Filled 2017-02-28: qty 2

## 2017-02-28 NOTE — Progress Notes (Signed)
Ref: Nolene Ebbs, MD   Subjective:  Feeling better. Off dopamine. Moderate LV systolic dysfunction with small apical layered thrombus and RV systolic dysfunction. Blood pressure still soft.  Objective:  Vital Signs in the last 24 hours: Temp:  [97.6 F (36.4 C)-99.3 F (37.4 C)] 99.3 F (37.4 C) (05/12 0744) Pulse Rate:  [34-149] 149 (05/12 0930) Cardiac Rhythm: Normal sinus rhythm (05/12 0800) Resp:  [11-35] 35 (05/12 0930) BP: (72-126)/(49-80) 79/66 (05/12 0930) SpO2:  [91 %-100 %] 99 % (05/12 0930)  Physical Exam: BP Readings from Last 1 Encounters:  02/28/17 (!) 79/66    Wt Readings from Last 1 Encounters:  02/25/17 60.8 kg (134 lb)    Weight change:  Body mass index is 22.3 kg/m. HEENT: [place]/AT, Eyes-Brown, PERL, EOMI, Conjunctiva-Pink, Sclera-Non-icteric Neck: No JVD, No bruit, Trachea midline. Lungs:  Clear, Bilateral. Cardiac:  Regular rhythm, normal S1 and S2, no S3. II/VI systolic murmur. Abdomen:  Soft, non-tender. BS present. Extremities:  No edema present. No cyanosis. No clubbing. CNS: AxOx3, Cranial nerves grossly intact, moves all 4 extremities.  Skin: Warm and dry.   Intake/Output from previous day: 05/11 0701 - 05/12 0700 In: 1593.4 [P.O.:390; I.V.:1153.4; IV Piggyback:50] Out: 900 [Urine:900]    Lab Results: BMET    Component Value Date/Time   NA 141 02/28/2017 0550   NA 140 02/27/2017 0230   NA 140 02/26/2017 0222   NA 143 02/12/2017 1035   NA 145 01/29/2017 1428   NA 140 12/18/2016 0932   K 4.6 02/28/2017 0550   K 3.1 (L) 02/27/2017 0230   K 3.4 (L) 02/26/2017 0222   K 3.8 02/12/2017 1035   K 3.7 01/29/2017 1428   K 4.9 12/18/2016 0932   CL 114 (H) 02/28/2017 0550   CL 109 02/27/2017 0230   CL 111 02/26/2017 0222   CO2 21 (L) 02/28/2017 0550   CO2 21 (L) 02/27/2017 0230   CO2 20 (L) 02/26/2017 0222   CO2 23 02/12/2017 1035   CO2 25 01/29/2017 1428   CO2 24 12/18/2016 0932   GLUCOSE 94 02/28/2017 0550   GLUCOSE 123 (H)  02/27/2017 0230   GLUCOSE 96 02/26/2017 0222   GLUCOSE 106 02/12/2017 1035   GLUCOSE 127 01/29/2017 1428   GLUCOSE 138 12/18/2016 0932   BUN 9 02/28/2017 0550   BUN 5 (L) 02/27/2017 0230   BUN 8 02/26/2017 0222   BUN 20.6 02/12/2017 1035   BUN 19.1 01/29/2017 1428   BUN 16.8 12/18/2016 0932   CREATININE 0.71 02/28/2017 0550   CREATININE 0.72 02/27/2017 0230   CREATININE 0.76 02/26/2017 0222   CREATININE 1.0 02/12/2017 1035   CREATININE 0.9 01/29/2017 1428   CREATININE 1.7 (H) 12/18/2016 0932   CALCIUM 7.7 (L) 02/28/2017 0550   CALCIUM 7.7 (L) 02/27/2017 0230   CALCIUM 7.4 (L) 02/26/2017 0222   CALCIUM 9.3 02/12/2017 1035   CALCIUM 9.2 01/29/2017 1428   CALCIUM 9.9 12/18/2016 0932   GFRNONAA >60 02/28/2017 0550   GFRNONAA >60 02/27/2017 0230   GFRNONAA >60 02/26/2017 0222   GFRAA >60 02/28/2017 0550   GFRAA >60 02/27/2017 0230   GFRAA >60 02/26/2017 0222   CBC    Component Value Date/Time   WBC 11.3 (H) 02/28/2017 0550   RBC 2.96 (L) 02/28/2017 0550   HGB 8.6 (L) 02/28/2017 0550   HGB 10.1 (L) 02/12/2017 1035   HCT 26.5 (L) 02/28/2017 0550   HCT 31.9 (L) 02/12/2017 1035   PLT 249 02/28/2017 0550   PLT  258 02/12/2017 1035   MCV 89.5 02/28/2017 0550   MCV 95.8 02/12/2017 1035   MCH 29.1 02/28/2017 0550   MCHC 32.5 02/28/2017 0550   RDW 17.1 (H) 02/28/2017 0550   RDW 15.3 (H) 02/12/2017 1035   LYMPHSABS 1.8 02/28/2017 0550   LYMPHSABS 1.0 02/12/2017 1035   MONOABS 0.9 02/28/2017 0550   MONOABS 0.9 02/12/2017 1035   EOSABS 0.0 02/28/2017 0550   EOSABS 0.1 02/12/2017 1035   BASOSABS 0.0 02/28/2017 0550   BASOSABS 0.0 02/12/2017 1035   HEPATIC Function Panel  Recent Labs  02/25/17 0300 02/27/17 0230 02/28/17 0550  PROT 4.6* 4.8* 4.3*   HEMOGLOBIN A1C No components found for: HGA1C,  MPG CARDIAC ENZYMES Lab Results  Component Value Date   CKTOTAL 129 01/01/2012   CKMB 4.6 (H) 01/01/2012   TROPONINI >65.00 (HH) 02/25/2017   TROPONINI 0.08 (HH)  01/22/2017   TROPONINI 0.08 (HH) 01/22/2017   BNP No results for input(s): PROBNP in the last 8760 hours. TSH  Recent Labs  12/06/16 2005 12/18/16 1056 12/22/16 2008  TSH 0.541 0.576 0.481   CHOLESTEROL  Recent Labs  12/07/16 0605 02/24/17 2028  CHOL 220* 196    Scheduled Meds: . acyclovir  400 mg Oral BID  . amiodarone  200 mg Oral BID  . aspirin EC  81 mg Oral Daily  . atorvastatin  80 mg Oral q1800  . calcium carbonate  1 tablet Oral Q breakfast  . Chlorhexidine Gluconate Cloth  6 each Topical Daily  . cholecalciferol  1,000 Units Oral Daily  . dexamethasone  20 mg Oral Weekly  . digoxin  0.25 mg Intravenous Daily  . DULoxetine  20 mg Oral Daily  . feeding supplement (ENSURE ENLIVE)  237 mL Oral TID BM  . pantoprazole (PROTONIX) IV  40 mg Intravenous Q24H  . pyridostigmine  30 mg Oral BID  . sodium chloride flush  10-40 mL Intracatheter Q12H  . sodium chloride flush  3 mL Intravenous Q12H  . ticagrelor  90 mg Oral BID  . vitamin C  1,000 mg Oral Daily   Continuous Infusions: . sodium chloride 20 mL/hr at 02/25/17 1400  . cefTRIAXone (ROCEPHIN)  IV 1 g (02/28/17 0600)  . DOPamine Stopped (02/28/17 0750)   PRN Meds:.acetaminophen, midazolam, ondansetron (ZOFRAN) IV, oxyCODONE-acetaminophen, polyethylene glycol, sodium chloride flush, sodium chloride flush  Assessment/Plan: Acute anterior wall MI SVT Non-sustained VT Hypokalemia-corrected Hypoalbuminemia Anemia of chronic disease and blood loss S/P LAD and LCX stent Multiple myeloma  Add small dose lanoxin Continue Brilinta   LOS: 4 days    [name]  MD  02/28/2017, 10:14 AM

## 2017-02-28 NOTE — Progress Notes (Signed)
Patient ambulated 1 lap around unit (370 feet) with walker on room air.  VSS and tolerated ambulation well.

## 2017-02-28 NOTE — Progress Notes (Signed)
Patient resting & HR dropped as low as 36. Dopamine was turned off today. Pt is asymptomatic and states she's "feeling great."   MD on call notified about HR. No new orders received.   Will continue to monitor.   [name] E Abigail [name], [place]

## 2017-03-01 MED ORDER — AMIODARONE HCL 200 MG PO TABS: 200.0000 mg | ORAL_TABLET | Freq: Every day | ORAL | 3 refills | Status: DC

## 2017-03-01 MED ORDER — DEXAMETHASONE 4 MG PO TABS: 20.0000 mg | ORAL_TABLET | ORAL | Status: DC

## 2017-03-01 MED ORDER — PANTOPRAZOLE SODIUM 40 MG PO TBEC
40.0000 mg | DELAYED_RELEASE_TABLET | Freq: Every day | ORAL | Status: DC
  Administered 2017-03-01: 40 mg via ORAL
  Filled 2017-03-01: qty 1

## 2017-03-01 MED ORDER — TRAMADOL HCL 50 MG PO TABS: 50.0000 mg | ORAL_TABLET | Freq: Once | ORAL | Status: DC

## 2017-03-01 MED ORDER — POTASSIUM CHLORIDE CRYS ER 10 MEQ PO TBCR: 10.0000 meq | EXTENDED_RELEASE_TABLET | ORAL | 1 refills | Status: DC

## 2017-03-01 MED ORDER — FERROUS SULFATE 325 (65 FE) MG PO TABS
325.0000 mg | ORAL_TABLET | Freq: Every day | ORAL | Status: DC
  Administered 2017-03-01: 325 mg via ORAL
  Filled 2017-03-01: qty 1

## 2017-03-01 MED ORDER — AMIODARONE HCL 200 MG PO TABS
200.0000 mg | ORAL_TABLET | Freq: Every day | ORAL | Status: DC
  Administered 2017-03-01: 200 mg via ORAL
  Filled 2017-03-01: qty 1

## 2017-03-01 MED ORDER — DIGOXIN 125 MCG PO TABS: 0.0625 mg | ORAL_TABLET | Freq: Every day | ORAL | 3 refills | Status: DC

## 2017-03-01 MED ORDER — FERROUS SULFATE 325 (65 FE) MG PO TABS: 325.0000 mg | ORAL_TABLET | Freq: Every day | ORAL | 3 refills | Status: DC

## 2017-03-01 MED ORDER — ATORVASTATIN CALCIUM 80 MG PO TABS: 80.0000 mg | ORAL_TABLET | Freq: Every day | ORAL | 3 refills | Status: DC

## 2017-03-01 MED ORDER — MAGNESIUM SULFATE 2 GM/50ML IV SOLN
2.0000 g | Freq: Once | INTRAVENOUS | Status: AC
  Administered 2017-03-01: 2 g via INTRAVENOUS
  Filled 2017-03-01: qty 50

## 2017-03-01 MED ORDER — DIGOXIN 125 MCG PO TABS
0.0625 mg | ORAL_TABLET | Freq: Every day | ORAL | Status: DC
  Administered 2017-03-01: 0.0625 mg via ORAL
  Filled 2017-03-01: qty 1

## 2017-03-01 NOTE — Progress Notes (Signed)
Patient ambulated in hallway with walker on room air, 640 feet, tolerated ambulation well. [place], Ardeth Sportsman

## 2017-03-01 NOTE — Progress Notes (Signed)
DC orders received.  Patient stable with no S/S of distress.  Medication, femoral cath site care and discharge instructions reviewed with patient.  Patient awaiting PICC line removal before DC with sister. [place], Ardeth Sportsman

## 2017-03-01 NOTE — Discharge Instructions (Signed)
Femoral Site Care °Refer to this sheet in the next few weeks. These instructions provide you with information about caring for yourself after your procedure. Your health care provider may also give you more specific instructions. Your treatment has been planned according to current medical practices, but problems sometimes occur. Call your health care provider if you have any problems or questions after your procedure. °What can I expect after the procedure? °After your procedure, it is typical to have the following: °· Bruising at the site that usually fades within 1-2 weeks. °· Blood collecting in the tissue (hematoma) that may be painful to the touch. It should usually decrease in size and tenderness within 1-2 weeks. °Follow these instructions at home: °· Take medicines only as directed by your health care provider. °· You may shower 24-48 hours after the procedure or as directed by your health care provider. Remove the bandage (dressing) and gently wash the site with plain soap and water. Pat the area dry with a clean towel. Do not rub the site, because this may cause bleeding. °· Do not take baths, swim, or use a hot tub until your health care provider approves. °· Check your insertion site every day for redness, swelling, or drainage. °· Do not apply powder or lotion to the site. °· Limit use of stairs to twice a day for the first 2-3 days or as directed by your health care provider. °· Do not squat for the first 2-3 days or as directed by your health care provider. °· Do not lift over 10 lb (4.5 kg) for 5 days after your procedure or as directed by your health care provider. °· Ask your health care provider when it is okay to: °¨ Return to work or school. °¨ Resume usual physical activities or sports. °¨ Resume sexual activity. °· Do not drive home if you are discharged the same day as the procedure. Have someone else drive you. °· You may drive 24 hours after the procedure unless otherwise instructed by  your health care provider. °· Do not operate machinery or power tools for 24 hours after the procedure or as directed by your health care provider. °· If your procedure was done as an outpatient procedure, which means that you went home the same day as your procedure, a responsible adult should be with you for the first 24 hours after you arrive home. °· Keep all follow-up visits as directed by your health care provider. This is important. °Contact a health care provider if: °· You have a fever. °· You have chills. °· You have increased bleeding from the site. Hold pressure on the site. °Get help right away if: °· You have unusual pain at the site. °· You have redness, warmth, or swelling at the site. °· You have drainage (other than a small amount of blood on the dressing) from the site. °· The site is bleeding, and the bleeding does not stop after 30 minutes of holding steady pressure on the site. °· Your leg or foot becomes pale, cool, tingly, or numb. °This information is not intended to replace advice given to you by your health care provider. Make sure you discuss any questions you have with your health care provider. °Document Released: 06/09/2014 Document Revised: 03/13/2016 Document Reviewed: 04/25/2014 °Elsevier Interactive Patient Education © 2017 [employer] ° °

## 2017-03-01 NOTE — Discharge Summary (Signed)
Physician Discharge Summary  Patient ID: Abigail [name] MRN: [id number] DOB/AGE: 1945/12/14 71 y.o.  Admit date: 02/24/2017 Discharge date: 03/01/2017  Admission Diagnoses: Acute anterior wall MI Anemia of chronic disease and blood loss Multiple myeloma Hypoalbuminemia Chronic nausea and occasional vomiting.  Discharge Diagnoses:  Principle problem: Acute antero-lateral wall MI Active Problems:   STEMI (ST elevation myocardial infarction) ([place])   S/P LAD and LCX stent   SVT, paroxysmal   Non-sustained VT   Anemia of chronic disease and blood loss   Hypoalbuminemia   Hypomagnesemia   Small LV apical thrombus     Discharged Condition: fair  Hospital Course: 71 year old female with h/o CAD, angioplasty of LAD and Diagonal coronary arteries 2 months ago had ST elevations in anterior leads with chest pain. Emergent stents were placed in LAD and LCX for acute stent thrombosis for possible hypercoagulable state and medication non-compliance. Post procedure she had inotropic support for 3 days. Warfarin was not added due to tendency to bleed and fall. Her activity was increased and on 03/01/2017 she was discharged home with follow up by me in 1 week and primary care in 2 weeks.   Consults: cardiology  Significant Diagnostic Studies: labs: Elevated WBC count and chronic low hemoglobin of 8.2 g/dL. Low magnesium of 1.2 before 2 gmsupplement. Hypokalemia on admission, corrected with supplementation. Normal BUN/Cr. Low albumin level of 2.6.  EKG-SR with acute ST elevations in anterior leads.  Chest X-ray: Shallow inspiration with atelectasis.  Cardiac catheterization: Total occlusion of proximal LAD and significant LCX disease treated with drug-eluting stents in LAD and LCX.  Echocardiogram : moderate LV systolic dysfunction with hypo to akinesia of apex of the heart with possible small layered thrombus. EF 40-45 %. Moderate MR and moderate RV systolic dysfunction.  Treatments: cardiac meds:  Atorvastatin, digoxin, amiodarone, Brilinta,  and aspirin  Discharge Exam: Blood pressure 100/74, pulse 65, temperature 98.6 F (37 C), temperature source Oral, resp. rate 18, height '5\' 5"'  (1.651 m), weight 60.8 kg (134 lb), SpO2 95 %. General appearance: alert, cooperative and appears stated age. Head: Normocephalic, atraumatic. Eyes: Brown eyes, pale conjunctiva, corneas clear. PERRL, EOM's intact.  Neck: No adenopathy, no carotid bruit, no JVD, supple, symmetrical, trachea midline and thyroid not enlarged. Resp: Clear to auscultation bilaterally. Cardio: Regular rate and rhythm, S1, S2 normal, II/VI systolic murmur, no click, rub or gallop. GI: Soft, non-tender; bowel sounds normal; no organomegaly. Extremities: No edema, cyanosis or clubbing. Skin: Warm and dry.  Neurologic: Alert and oriented X 3, normal strength and tone. Normal coordination and slow gait.  Disposition: 06-Home-Health Care Svc  Discharge Instructions    AMB Referral to Cardiac Rehabilitation - Phase II    Complete by:  As directed    Diagnosis:   Coronary Stents STEMI     Amb Referral to Cardiac Rehabilitation    Complete by:  As directed    Diagnosis:   Coronary Stents STEMI       Allergies as of 03/01/2017      Reactions   Sulfa Antibiotics Rash      Medication List    STOP taking these medications   metoprolol tartrate 25 MG tablet Commonly known as:  LOPRESSOR   pravastatin 40 MG tablet Commonly known as:  PRAVACHOL     TAKE these medications   acyclovir 400 MG tablet Commonly known as:  ZOVIRAX TAKE 1 TABLET (400 MG TOTAL) BY MOUTH 2 (TWO) TIMES DAILY.   amiodarone 200 MG tablet Commonly  known as:  PACERONE Take 1 tablet (200 mg total) by mouth daily. Start taking on:  03/02/2017   aspirin EC 81 MG tablet Take 81 mg by mouth daily.   atorvastatin 80 MG tablet Commonly known as:  LIPITOR Take 1 tablet (80 mg total) by mouth daily at 6 PM.   calcium carbonate 1250 (500 Ca) MG  tablet Commonly known as:  OS-CAL - dosed in mg of elemental calcium Take 1 tablet by mouth daily with breakfast.   dexamethasone 4 MG tablet Commonly known as:  DECADRON Take 5 tablets (20 mg total) by mouth every Thursday. Start taking on:  03/05/2017   digoxin 0.125 MG tablet Commonly known as:  LANOXIN Take 0.5 tablets (0.0625 mg total) by mouth daily. Start taking on:  03/02/2017   DULoxetine 20 MG capsule Commonly known as:  CYMBALTA Take 1 capsule (20 mg total) by mouth daily.   feeding supplement (ENSURE ENLIVE) Liqd Take 237 mLs by mouth 2 (two) times daily between meals. What changed:  when to take this  additional instructions   ferrous sulfate 325 (65 FE) MG tablet Take 1 tablet (325 mg total) by mouth daily with breakfast. Start taking on:  03/02/2017   lenalidomide 15 MG capsule Commonly known as:  REVLIMID Take 1 capsule (15 mg total) by mouth daily. For 2 weeks then 2 weeks off.   mirtazapine 15 MG tablet Commonly known as:  REMERON Take 1 tablet (15 mg total) by mouth at bedtime. What changed:  how much to take   pantoprazole 40 MG tablet Commonly known as:  PROTONIX Take 1 tablet (40 mg total) by mouth 2 (two) times daily before a meal.   polyethylene glycol packet Commonly known as:  MIRALAX / GLYCOLAX Take 17 g by mouth daily as needed for moderate constipation.   potassium chloride 10 MEQ tablet Commonly known as:  K-DUR,KLOR-CON Take 1 tablet (10 mEq total) by mouth every Monday, Wednesday, and Friday. Start taking on:  03/02/2017 What changed:  when to take this   pyridostigmine 60 MG tablet Commonly known as:  MESTINON Take 0.5 tablets (30 mg total) by mouth 2 (two) times daily.   ticagrelor 90 MG Tabs tablet Commonly known as:  BRILINTA Take 1 tablet (90 mg total) by mouth 2 (two) times daily.   vitamin C 1000 MG tablet Take 1,000 mg by mouth daily.   VITAMIN D PO Take 1 capsule by mouth daily.      Follow-up Information     Nolene Ebbs, MD. Schedule an appointment as soon as possible for a visit in 2 week(s).   Specialty:  Internal Medicine Contact information: [place] [address] [phone]        [name], MD. Schedule an appointment as soon as possible for a visit in 1 week(s).   Specialty:  Cardiology Contact information: [place] [address] [phone]           Signed: Birdie Riddle 03/01/2017, 11:46 AM

## 2017-03-02 LAB — TYPE AND SCREEN
ABO/RH(D): O POS
ANTIBODY SCREEN: NEGATIVE
Unit division: 0
Unit division: 0

## 2017-03-02 LAB — BPAM RBC
BLOOD PRODUCT EXPIRATION DATE: 201805312359
Blood Product Expiration Date: 201805312359
ISSUE DATE / TIME: 201805101602
UNIT TYPE AND RH: 5100
Unit Type and Rh: 5100

## 2017-03-03 ENCOUNTER — Telehealth (HOSPITAL_COMMUNITY): Payer: Self-pay

## 2017-03-03 ENCOUNTER — Telehealth: Payer: Self-pay | Admitting: Oncology

## 2017-03-03 LAB — CULTURE, BLOOD (ROUTINE X 2)
CULTURE: NO GROWTH
Special Requests: ADEQUATE

## 2017-03-03 NOTE — Telephone Encounter (Signed)
Verified [employer] insurance benefits through [employer]. Copay $10, No Coinsurance or Deductible Out of Pocket $5900.00, pt has met $3120.53. Reference [id number].... KJ

## 2017-03-03 NOTE — Telephone Encounter (Signed)
Not able to reach patient's sister re 5/18 appointment as per 5/14 schedule message. Called sister's number and patient answered. Per patient sister not in. Spoke with patient re 5/18 f/u.

## 2017-03-04 ENCOUNTER — Inpatient Hospital Stay (HOSPITAL_COMMUNITY)
Admission: EM | Admit: 2017-03-04 | Discharge: 2017-03-12 | DRG: 286 | Disposition: A | Discharge status: Home Health | Payer: Medicare HMO | Attending: Cardiovascular Disease | Admitting: Cardiovascular Disease

## 2017-03-04 ENCOUNTER — Encounter (HOSPITAL_COMMUNITY): Payer: Self-pay

## 2017-03-04 ENCOUNTER — Emergency Department (HOSPITAL_COMMUNITY): Payer: Medicare HMO

## 2017-03-04 DIAGNOSIS — Z955 Presence of coronary angioplasty implant and graft: Secondary | ICD-10-CM | POA: Diagnosis not present

## 2017-03-04 DIAGNOSIS — C9000 Multiple myeloma not having achieved remission: Secondary | ICD-10-CM | POA: Diagnosis present

## 2017-03-04 DIAGNOSIS — I129 Hypertensive chronic kidney disease with stage 1 through stage 4 chronic kidney disease, or unspecified chronic kidney disease: Secondary | ICD-10-CM | POA: Diagnosis present

## 2017-03-04 DIAGNOSIS — I2510 Atherosclerotic heart disease of native coronary artery without angina pectoris: Secondary | ICD-10-CM | POA: Diagnosis present

## 2017-03-04 DIAGNOSIS — Z882 Allergy status to sulfonamides status: Secondary | ICD-10-CM

## 2017-03-04 DIAGNOSIS — E785 Hyperlipidemia, unspecified: Secondary | ICD-10-CM | POA: Diagnosis present

## 2017-03-04 DIAGNOSIS — Z7982 Long term (current) use of aspirin: Secondary | ICD-10-CM

## 2017-03-04 DIAGNOSIS — M79605 Pain in left leg: Secondary | ICD-10-CM | POA: Diagnosis not present

## 2017-03-04 DIAGNOSIS — K219 Gastro-esophageal reflux disease without esophagitis: Secondary | ICD-10-CM | POA: Diagnosis present

## 2017-03-04 DIAGNOSIS — I513 Intracardiac thrombosis, not elsewhere classified: Secondary | ICD-10-CM | POA: Diagnosis present

## 2017-03-04 DIAGNOSIS — I252 Old myocardial infarction: Secondary | ICD-10-CM | POA: Diagnosis not present

## 2017-03-04 DIAGNOSIS — Z8711 Personal history of peptic ulcer disease: Secondary | ICD-10-CM

## 2017-03-04 DIAGNOSIS — E43 Unspecified severe protein-calorie malnutrition: Secondary | ICD-10-CM | POA: Diagnosis present

## 2017-03-04 DIAGNOSIS — F17210 Nicotine dependence, cigarettes, uncomplicated: Secondary | ICD-10-CM | POA: Diagnosis present

## 2017-03-04 DIAGNOSIS — D500 Iron deficiency anemia secondary to blood loss (chronic): Secondary | ICD-10-CM | POA: Diagnosis present

## 2017-03-04 DIAGNOSIS — D638 Anemia in other chronic diseases classified elsewhere: Secondary | ICD-10-CM | POA: Diagnosis present

## 2017-03-04 DIAGNOSIS — E8809 Other disorders of plasma-protein metabolism, not elsewhere classified: Secondary | ICD-10-CM | POA: Diagnosis present

## 2017-03-04 DIAGNOSIS — I480 Paroxysmal atrial fibrillation: Secondary | ICD-10-CM | POA: Diagnosis present

## 2017-03-04 DIAGNOSIS — Z79899 Other long term (current) drug therapy: Secondary | ICD-10-CM | POA: Diagnosis not present

## 2017-03-04 DIAGNOSIS — I10 Essential (primary) hypertension: Secondary | ICD-10-CM

## 2017-03-04 DIAGNOSIS — R072 Precordial pain: Secondary | ICD-10-CM

## 2017-03-04 DIAGNOSIS — M79672 Pain in left foot: Secondary | ICD-10-CM

## 2017-03-04 DIAGNOSIS — Z6821 Body mass index (BMI) 21.0-21.9, adult: Secondary | ICD-10-CM | POA: Diagnosis not present

## 2017-03-04 DIAGNOSIS — N183 Chronic kidney disease, stage 3 (moderate): Secondary | ICD-10-CM | POA: Diagnosis present

## 2017-03-04 DIAGNOSIS — E876 Hypokalemia: Secondary | ICD-10-CM | POA: Diagnosis present

## 2017-03-04 DIAGNOSIS — I4581 Long QT syndrome: Secondary | ICD-10-CM | POA: Diagnosis present

## 2017-03-04 DIAGNOSIS — I824Z2 Acute embolism and thrombosis of unspecified deep veins of left distal lower extremity: Secondary | ICD-10-CM | POA: Diagnosis present

## 2017-03-04 DIAGNOSIS — I249 Acute ischemic heart disease, unspecified: Secondary | ICD-10-CM | POA: Diagnosis present

## 2017-03-04 DIAGNOSIS — R7989 Other specified abnormal findings of blood chemistry: Secondary | ICD-10-CM

## 2017-03-04 DIAGNOSIS — R778 Other specified abnormalities of plasma proteins: Secondary | ICD-10-CM

## 2017-03-04 LAB — I-STAT TROPONIN, ED: Troponin i, poc: 3.45 ng/mL (ref 0.00–0.08)

## 2017-03-04 LAB — CBC WITH DIFFERENTIAL/PLATELET
Basophils Absolute: 0 10*3/uL (ref 0.0–0.1)
Basophils Relative: 0 %
EOS PCT: 1 %
Eosinophils Absolute: 0.1 10*3/uL (ref 0.0–0.7)
HEMATOCRIT: 32 % — AB (ref 36.0–46.0)
HEMOGLOBIN: 10.3 g/dL — AB (ref 12.0–15.0)
Lymphocytes Relative: 11 %
Lymphs Abs: 0.8 10*3/uL (ref 0.7–4.0)
MCH: 29.4 pg (ref 26.0–34.0)
MCHC: 32.2 g/dL (ref 30.0–36.0)
MCV: 91.4 fL (ref 78.0–100.0)
Monocytes Absolute: 1.5 10*3/uL — ABNORMAL HIGH (ref 0.1–1.0)
Monocytes Relative: 19 %
NEUTROS ABS: 5.5 10*3/uL (ref 1.7–7.7)
Neutrophils Relative %: 69 %
PLATELETS: 372 10*3/uL (ref 150–400)
RBC: 3.5 MIL/uL — AB (ref 3.87–5.11)
RDW: 16.5 % — ABNORMAL HIGH (ref 11.5–15.5)
WBC: 7.9 10*3/uL (ref 4.0–10.5)

## 2017-03-04 LAB — BASIC METABOLIC PANEL
Anion gap: 11 (ref 5–15)
BUN: 7 mg/dL (ref 6–20)
CHLORIDE: 107 mmol/L (ref 101–111)
CO2: 23 mmol/L (ref 22–32)
Calcium: 8.8 mg/dL — ABNORMAL LOW (ref 8.9–10.3)
Creatinine, Ser: 0.86 mg/dL (ref 0.44–1.00)
Glucose, Bld: 100 mg/dL — ABNORMAL HIGH (ref 65–99)
POTASSIUM: 3.2 mmol/L — AB (ref 3.5–5.1)
SODIUM: 141 mmol/L (ref 135–145)

## 2017-03-04 LAB — HEPARIN LEVEL (UNFRACTIONATED): HEPARIN UNFRACTIONATED: 0.1 [IU]/mL — AB (ref 0.30–0.70)

## 2017-03-04 LAB — TROPONIN I
TROPONIN I: 3.61 ng/mL — AB (ref <0.03)
Troponin I: 2.96 ng/mL (ref <0.03)
Troponin I: 2.8 ng/mL (ref <0.03)

## 2017-03-04 MED ORDER — ASPIRIN EC 81 MG PO TBEC
81.0000 mg | DELAYED_RELEASE_TABLET | Freq: Every day | ORAL | Status: DC
  Administered 2017-03-05 – 2017-03-12 (×9): 81 mg via ORAL
  Filled 2017-03-04 (×9): qty 1

## 2017-03-04 MED ORDER — ASPIRIN 81 MG PO CHEW
324.0000 mg | CHEWABLE_TABLET | Freq: Once | ORAL | Status: AC
  Administered 2017-03-04: 324 mg via ORAL
  Filled 2017-03-04: qty 4

## 2017-03-04 MED ORDER — ACETAMINOPHEN 325 MG PO TABS
650.0000 mg | ORAL_TABLET | ORAL | Status: DC | PRN
Start: 2017-03-04 — End: 2017-03-12
  Administered 2017-03-11: 650 mg via ORAL
  Filled 2017-03-04: qty 2

## 2017-03-04 MED ORDER — HEPARIN BOLUS VIA INFUSION
3500.0000 [IU] | Freq: Once | INTRAVENOUS | Status: AC
  Administered 2017-03-04: 3500 [IU] via INTRAVENOUS
  Filled 2017-03-04: qty 3500

## 2017-03-04 MED ORDER — POLYETHYLENE GLYCOL 3350 17 G PO PACK
17.0000 g | PACK | Freq: Every day | ORAL | Status: DC | PRN
  Administered 2017-03-10: 17 g via ORAL

## 2017-03-04 MED ORDER — ONDANSETRON HCL 4 MG/2ML IJ SOLN: 4.0000 mg | Freq: Four times a day (QID) | INTRAMUSCULAR | Status: DC | PRN

## 2017-03-04 MED ORDER — AMIODARONE HCL 200 MG PO TABS
200.0000 mg | ORAL_TABLET | Freq: Every day | ORAL | Status: DC
  Administered 2017-03-04 – 2017-03-09 (×6): 200 mg via ORAL
  Filled 2017-03-04 (×6): qty 1

## 2017-03-04 MED ORDER — PANTOPRAZOLE SODIUM 40 MG PO TBEC
40.0000 mg | DELAYED_RELEASE_TABLET | Freq: Two times a day (BID) | ORAL | Status: DC
  Administered 2017-03-04 – 2017-03-10 (×13): 40 mg via ORAL
  Filled 2017-03-04 (×13): qty 1

## 2017-03-04 MED ORDER — FERROUS SULFATE 325 (65 FE) MG PO TABS
325.0000 mg | ORAL_TABLET | Freq: Every day | ORAL | Status: DC
  Administered 2017-03-04 – 2017-03-07 (×4): 325 mg via ORAL
  Filled 2017-03-04 (×4): qty 1

## 2017-03-04 MED ORDER — CALCIUM CARBONATE 1250 (500 CA) MG PO TABS
1.0000 | ORAL_TABLET | Freq: Every day | ORAL | Status: DC
  Administered 2017-03-04 – 2017-03-12 (×9): 500 mg via ORAL
  Filled 2017-03-04 (×10): qty 1

## 2017-03-04 MED ORDER — ATORVASTATIN CALCIUM 80 MG PO TABS
80.0000 mg | ORAL_TABLET | Freq: Every day | ORAL | Status: DC
  Administered 2017-03-04 – 2017-03-11 (×8): 80 mg via ORAL
  Filled 2017-03-04 (×8): qty 1

## 2017-03-04 MED ORDER — NITROGLYCERIN IN D5W 200-5 MCG/ML-% IV SOLN
5.0000 ug/min | INTRAVENOUS | Status: DC
  Administered 2017-03-04: 5 ug/min via INTRAVENOUS
  Filled 2017-03-04: qty 250

## 2017-03-04 MED ORDER — HEPARIN (PORCINE) IN NACL 100-0.45 UNIT/ML-% IJ SOLN
650.0000 [IU]/h | INTRAMUSCULAR | Status: DC
  Administered 2017-03-04: 400 [IU]/h via INTRAVENOUS
  Filled 2017-03-04: qty 250

## 2017-03-04 MED ORDER — POTASSIUM CHLORIDE CRYS ER 10 MEQ PO TBCR
10.0000 meq | EXTENDED_RELEASE_TABLET | Freq: Three times a day (TID) | ORAL | Status: DC
  Administered 2017-03-04 – 2017-03-09 (×17): 10 meq via ORAL
  Filled 2017-03-04 (×19): qty 1

## 2017-03-04 MED ORDER — DULOXETINE HCL 20 MG PO CPEP
20.0000 mg | ORAL_CAPSULE | Freq: Every day | ORAL | Status: DC
  Administered 2017-03-04 – 2017-03-12 (×9): 20 mg via ORAL
  Filled 2017-03-04 (×9): qty 1

## 2017-03-04 MED ORDER — ACYCLOVIR 400 MG PO TABS
400.0000 mg | ORAL_TABLET | Freq: Two times a day (BID) | ORAL | Status: DC
  Administered 2017-03-04 – 2017-03-12 (×16): 400 mg via ORAL
  Filled 2017-03-04 (×16): qty 1

## 2017-03-04 MED ORDER — TICAGRELOR 90 MG PO TABS
90.0000 mg | ORAL_TABLET | Freq: Two times a day (BID) | ORAL | Status: DC
  Administered 2017-03-04 – 2017-03-12 (×17): 90 mg via ORAL
  Filled 2017-03-04 (×18): qty 1

## 2017-03-04 MED ORDER — NITROGLYCERIN 2 % TD OINT
1.0000 [in_us] | TOPICAL_OINTMENT | Freq: Once | TRANSDERMAL | Status: AC
  Administered 2017-03-04: 1 [in_us] via TOPICAL
  Filled 2017-03-04: qty 1

## 2017-03-04 MED ORDER — PYRIDOSTIGMINE BROMIDE 60 MG PO TABS
30.0000 mg | ORAL_TABLET | Freq: Two times a day (BID) | ORAL | Status: DC
  Administered 2017-03-04 – 2017-03-12 (×15): 30 mg via ORAL
  Filled 2017-03-04 (×17): qty 0.5

## 2017-03-04 MED ORDER — VITAMIN C 500 MG PO TABS
1000.0000 mg | ORAL_TABLET | Freq: Every day | ORAL | Status: DC
  Administered 2017-03-04 – 2017-03-12 (×9): 1000 mg via ORAL
  Filled 2017-03-04 (×9): qty 2

## 2017-03-04 MED ORDER — NITROGLYCERIN 0.4 MG SL SUBL
0.4000 mg | SUBLINGUAL_TABLET | SUBLINGUAL | Status: DC | PRN
  Administered 2017-03-05: 0.4 mg via SUBLINGUAL
  Filled 2017-03-04: qty 1

## 2017-03-04 MED ORDER — MIRTAZAPINE 15 MG PO TABS
15.0000 mg | ORAL_TABLET | Freq: Every day | ORAL | Status: DC
  Administered 2017-03-04 – 2017-03-11 (×8): 15 mg via ORAL
  Filled 2017-03-04 (×8): qty 1

## 2017-03-04 MED ORDER — ENSURE ENLIVE PO LIQD
237.0000 mL | Freq: Two times a day (BID) | ORAL | Status: DC
  Administered 2017-03-05: 237 mL via ORAL

## 2017-03-04 NOTE — H&P (Signed)
Referring Physician: Nolene Ebbs, MD  Abigail [name] is an 71 y.o. female.                       Chief Complaint: Chest pain  HPI: 71 year old female with recent antero-lateral wall MI and Stent in LAD and LCx has chest pain, mid chest and non-radiating, early this AM, responding to SL NTG. She has some nausea but no shortness of breath. Currently she is chest pain free.  Past Medical History:  Diagnosis Date  . Anemia    "off and on all my life" (12/22/2016)  . Anxiety   . Bilateral renal artery stenosis ([place]) 1999   s/p stenting  . Chronic kidney disease (CKD), stage III (moderate)    Archie Endo 12/22/2016  . Coronary artery disease 1999  . Depression   . Diverticulosis   . Duodenitis   . Gastric erosions   . Gastric ulcer   . GERD (gastroesophageal reflux disease)   . Heart murmur   . History of blood transfusion ~ 03/2016   "low HgB; practically nonexistent"  . Hyperlipidemia   . Hypertension   . MI (myocardial infarction) ([place]) 1999  . Multiple myeloma ([place]) dx'd ~ 04/2016  . PAT (paroxysmal atrial tachycardia) ([place]) 12/22/2016   Archie Endo 12/22/2016  . Retinal hemorrhage, right eye   . Schatzki's ring   . Tachy-brady syndrome ([place])    Archie Endo 12/22/2016  . Tubular adenoma of colon       Past Surgical History:  Procedure Laterality Date  . APPENDECTOMY    . BACK SURGERY    . CATARACT EXTRACTION W/ INTRAOCULAR LENS IMPLANT Right 2014  . CORONARY ANGIOGRAPHY N/A 01/23/2017   Procedure: Coronary Angiography;  Surgeon: [name], MD;  Location: [facility];  Service: Cardiovascular;  Laterality: N/A;  . CORONARY ANGIOPLASTY WITH STENT PLACEMENT  1999  . CORONARY STENT INTERVENTION N/A 12/29/2016   Procedure: Coronary Stent Intervention;  Surgeon: Charolette Forward, MD;  Location: [facility];  Service: Cardiovascular;  Laterality: N/A;  . CORONARY STENT INTERVENTION N/A 02/24/2017   Procedure: Coronary Stent Intervention;  Surgeon: Charolette Forward, MD;  Location: [facility];  Service: Cardiovascular;  Laterality: N/A;  . ESOPHAGOGASTRODUODENOSCOPY N/A 11/03/2015   Procedure: ESOPHAGOGASTRODUODENOSCOPY (EGD);  Surgeon: Carol Ada, MD;  Location: [facility];  Service: Endoscopy;  Laterality: N/A;  . ESOPHAGOGASTRODUODENOSCOPY (EGD) WITH PROPOFOL N/A 11/18/2016   Procedure: ESOPHAGOGASTRODUODENOSCOPY (EGD) WITH PROPOFOL;  Surgeon: Ladene Artist, MD;  Location: [facility];  Service: Endoscopy;  Laterality: N/A;  . LEFT HEART CATH AND CORONARY ANGIOGRAPHY N/A 12/24/2016   Procedure: Left Heart Cath and Coronary Angiography;  Surgeon: [name], MD;  Location: [facility];  Service: Cardiovascular;  Laterality: N/A;  . LEFT HEART CATH AND CORONARY ANGIOGRAPHY N/A 02/24/2017   Procedure: Left Heart Cath and Coronary Angiography;  Surgeon: Charolette Forward, MD;  Location: [facility];  Service: Cardiovascular;  Laterality: N/A;  . LUMBAR [place]    . RENAL ARTERY STENT  1999   bil RAS so ? bil vs unilateral stents  . RETINAL LASER PROCEDURE Right 2012   "bleeding"  . TONSILLECTOMY    . TOTAL ABDOMINAL HYSTERECTOMY      Family History  Problem Relation Age of Onset  . Colon cancer Neg Hx    Social History:  reports that she has been smoking Cigarettes.  She has a 25.00 pack-year smoking history. She has never  used smokeless tobacco. She reports that she does not drink alcohol or use drugs.  Allergies:  Allergies  Allergen Reactions  . Sulfa Antibiotics Rash     (Not in a hospital admission)  Results for orders placed or performed during the hospital encounter of 03/04/17 (from the past 48 hour(s))  Basic metabolic panel     Status: Abnormal   Collection Time: 03/04/17  6:12 AM  Result Value Ref Range   Sodium 141 135 - 145 mmol/L   Potassium 3.2 (L) 3.5 - 5.1 mmol/L   Chloride 107 101 - 111 mmol/L   CO2 23 22 - 32 mmol/L   Glucose, Bld 100 (H) 65 - 99 mg/dL   BUN 7 6 - 20 mg/dL   Creatinine, Ser 0.86 0.44 - 1.00 mg/dL    Calcium 8.8 (L) 8.9 - 10.3 mg/dL   GFR calc non Af Amer >60 >60 mL/min   GFR calc Af Amer >60 >60 mL/min    Comment: (NOTE) The eGFR has been calculated using the CKD EPI equation. This calculation has not been validated in all clinical situations. eGFR's persistently <60 mL/min signify possible Chronic Kidney Disease.    Anion gap 11 5 - 15  CBC with Differential     Status: Abnormal   Collection Time: 03/04/17  6:12 AM  Result Value Ref Range   WBC 7.9 4.0 - 10.5 K/uL   RBC 3.50 (L) 3.87 - 5.11 MIL/uL   Hemoglobin 10.3 (L) 12.0 - 15.0 g/dL   HCT 32.0 (L) 36.0 - 46.0 %   MCV 91.4 78.0 - 100.0 fL   MCH 29.4 26.0 - 34.0 pg   MCHC 32.2 30.0 - 36.0 g/dL   RDW 16.5 (H) 11.5 - 15.5 %   Platelets 372 150 - 400 K/uL   Neutrophils Relative % 69 %   Neutro Abs 5.5 1.7 - 7.7 K/uL   Lymphocytes Relative 11 %   Lymphs Abs 0.8 0.7 - 4.0 K/uL   Monocytes Relative 19 %   Monocytes Absolute 1.5 (H) 0.1 - 1.0 K/uL   Eosinophils Relative 1 %   Eosinophils Absolute 0.1 0.0 - 0.7 K/uL   Basophils Relative 0 %   Basophils Absolute 0.0 0.0 - 0.1 K/uL  I-stat troponin, ED     Status: Abnormal   Collection Time: 03/04/17  6:13 AM  Result Value Ref Range   Troponin i, poc 3.45 (HH) 0.00 - 0.08 ng/mL   Comment NOTIFIED PHYSICIAN    Comment 3            Comment: Due to the release kinetics of cTnI, a negative result within the first hours of the onset of symptoms does not rule out myocardial infarction with certainty. If myocardial infarction is still suspected, repeat the test at appropriate intervals.    Dg Chest 2 View  Result Date: 03/04/2017 CLINICAL DATA:  Chest pain.  Nausea. EXAM: CHEST  2 VIEW COMPARISON:  Radiographs 02/26/2017 FINDINGS: Unchanged heart size and mediastinal contours with mild cardiomegaly and atherosclerosis of the aortic arch. Coronary stent is visualized. Small bilateral pleural effusions, left greater than right. Adjacent left greater than right lung base airspace  opacities. No pulmonary edema. No pneumothorax. No acute osseous abnormality. IMPRESSION: Small bilateral pleural effusions and adjacent atelectasis, left greater than right. Stable cardiomegaly and atherosclerosis of the aortic arch. Electronically Signed   By: Jeb Levering M.D.   On: 03/04/2017 06:49    Review Of Systems Constitutional: No fever, chills, positive weight loss.  Eyes: No vision change, wears glasses. No discharge or pain. Ears: No hearing loss, No tinnitus. Respiratory: No asthma, COPD, pneumonias. Positive shortness of breath. No hemoptysis. Cardiovascular: Positive chest pain, palpitation, leg edema. Gastrointestinal: Positive nausea, vomiting, no diarrhea, positive constipation. Positive GI bleed. No hepatitis. Genitourinary: No dysuria, hematuria, kidney stone. No incontinance. Neurological: Positive headache, stroke, seizures.  Psychiatry: No psych facility admission for anxiety, depression, suicide. No detox. Skin: No rash. Musculoskeletal: Positive joint pain, fibromyalgia. No neck pain, back pain. Lymphadenopathy: No lymphadenopathy. Hematology: No anemia or easy bruising.   Blood pressure 124/68, pulse 67, temperature 98.6 F (37 C), temperature source Oral, resp. rate 17, SpO2 100 %. There is no height or weight on file to calculate BMI. General appearance: alert, cooperative, appears stated age and no distress Head: Normocephalic, atraumatic. Eyes: Brown eyes, pale conjunctiva, corneas clear. PERRL, EOM's intact. Neck: No adenopathy, no carotid bruit, no JVD, supple, symmetrical, trachea midline and thyroid not enlarged. Resp: Clear to auscultation bilaterally. Chest wall non-tender. Cardio: Regular rate and rhythm, S1, S2 normal, II/VI systolic murmur, no click, rub or gallop GI: Soft, non-tender; bowel sounds normal; no organomegaly. Extremities: No edema, cyanosis or clubbing. Skin: Warm and dry.  Neurologic: Alert and oriented X 3, normal strength.  Normal coordination and gait.  Assessment/Plan Acute coronary syndrome. Recent an tero-lateral wall MI S/P stent in LAD and LCx Multiple myeloma Anemia of chronic disease Anemia of blood loss Hypoalbuminemia Hypomagnesemia Small LV apical thrombus Hypokalemia  Admit R/O MI IV heparin. Potassium supplement     Kattleya Kuhnert S, MD  03/04/2017, 8:11 AM

## 2017-03-04 NOTE — ED Notes (Signed)
Patient declined breakfast

## 2017-03-04 NOTE — Progress Notes (Signed)
[facility]  Patient Status: Active (receiving services up to time of hospitalization)  [facility] is providing the following services: RN, PT, OT and HHA  If patient discharges after hours, please call [phone].   Abigail [name] 03/04/2017, 3:13 PM

## 2017-03-04 NOTE — Progress Notes (Signed)
ANTICOAGULATION CONSULT NOTE - Initial Consult  Pharmacy Consult for heparin Indication: chest pain/ACS  Allergies  Allergen Reactions  . Sulfa Antibiotics Rash    Patient Measurements:   Heparin Dosing Weight: 60kg  Vital Signs: Temp: 98.6 F (37 C) (05/16 0551) Temp Source: Oral (05/16 0551) BP: 111/64 (05/16 0815) Pulse Rate: 95 (05/16 0815)  Labs:  Recent Labs  03/04/17 0612  HGB 10.3*  HCT 32.0*  PLT 372  CREATININE 0.86    Estimated Creatinine Clearance: 54.8 mL/min (by C-G formula based on SCr of 0.86 mg/dL).   Medical History: Past Medical History:  Diagnosis Date  . Anemia    "off and on all my life" (12/22/2016)  . Anxiety   . Bilateral renal artery stenosis ([place]) 1999   s/p stenting  . Chronic kidney disease (CKD), stage III (moderate)    Archie Endo 12/22/2016  . Coronary artery disease 1999  . Depression   . Diverticulosis   . Duodenitis   . Gastric erosions   . Gastric ulcer   . GERD (gastroesophageal reflux disease)   . Heart murmur   . History of blood transfusion ~ 03/2016   "low HgB; practically nonexistent"  . Hyperlipidemia   . Hypertension   . MI (myocardial infarction) ([place]) 1999  . Multiple myeloma ([place]) dx'd ~ 04/2016  . PAT (paroxysmal atrial tachycardia) ([place]) 12/22/2016   Archie Endo 12/22/2016  . Retinal hemorrhage, right eye   . Schatzki's ring   . Tachy-brady syndrome ([place])    Archie Endo 12/22/2016  . Tubular adenoma of colon     Medications:  Infusions:  . heparin    . nitroGLYCERIN      Assessment: 66 yof presented to the ED with CP. She had a recent cardiac cath on May 8. Troponin elevated and now restarting IV heparin. Baseline H/H is slightly low but platelets are WNL. She is not on anticoagulation PTA.   Goal of Therapy:  Heparin level 0.3-0.7 units/ml Monitor platelets by anticoagulation protocol: Yes   Plan:  Heparin bolus 3500 units IV x 1 Heparin gtt 400 units/hr - pt was recently therapeutic on this low rate in April  2018 Check an 8 hr heparin level Daily heparin level and CBC  Ringo Sherod, Rande Lawman 03/04/2017,8:23 AM

## 2017-03-04 NOTE — ED Triage Notes (Addendum)
Pt states around 4 am started having L sided Cp, non radiating, some nausea no vomitng, denies SOB. Recent STEMI last week

## 2017-03-04 NOTE — ED Provider Notes (Signed)
[facility] DEPT Provider Note   CSN: [id number] Arrival date & time: 03/04/17  0542     History   Chief Complaint Chief Complaint  Patient presents with  . Chest Pain    HPI Steph MEIAH ZAMUDIO is a 71 y.o. female.  Hermie GERENE NEDD is a 71 y.o. Female with a history of MI, A-fib, multiple myeloma, and HTN who presents to the ED complaining of left sided chest pain since 4 am this morning. Patient reports she was awake watching TV when her pain began. She denies any associated symptoms such as shortness of breath. . She does have a history of recent cardiac catheterization. Seven days ago on her cath she had total occlusion of proximal LAD and significant LCX disease treated with drug-eluting stents in LAD and LCX. She is followed by cardiologist Dr. Doylene Canard. She denies fevers, coughing, shortness of breath, abdominal pain, nausea, vomiting, diarrhea, rashes, leg pain, leg swelling, lightheadedness or syncope.   The history is provided by the patient, medical records and a relative. No language interpreter was used.    Past Medical History:  Diagnosis Date  . Anemia    "off and on all my life" (12/22/2016)  . Anxiety   . Bilateral renal artery stenosis ([place]) 1999   s/p stenting  . Chronic kidney disease (CKD), stage III (moderate)    Archie Endo 12/22/2016  . Coronary artery disease 1999  . Depression   . Diverticulosis   . Duodenitis   . Gastric erosions   . Gastric ulcer   . GERD (gastroesophageal reflux disease)   . Heart murmur   . History of blood transfusion ~ 03/2016   "low HgB; practically nonexistent"  . Hyperlipidemia   . Hypertension   . MI (myocardial infarction) ([place]) 1999  . Multiple myeloma ([place]) dx'd ~ 04/2016  . PAT (paroxysmal atrial tachycardia) ([place]) 12/22/2016   Archie Endo 12/22/2016  . Retinal hemorrhage, right eye   . Schatzki's ring   . Tachy-brady syndrome ([place])    Archie Endo 12/22/2016  . Tubular adenoma of colon     Patient Active Problem List   Diagnosis Date Noted    . STEMI (ST elevation myocardial infarction) ([place]) 02/24/2017  . Acute anterolateral wall MI ([place]) 02/24/2017  . Dehydration 01/21/2017  . Acute coronary syndrome ([place]) 12/24/2016  . Dizziness 12/22/2016    Class: Acute  . Orthostatic hypotension 12/08/2016  . Syncope 12/08/2016  . Syncope and collapse 12/06/2016  . Tachycardia-bradycardia ([place])   . Essential hypertension   . Nausea and vomiting in adult patient   . Erosive esophagitis   . Protein-calorie malnutrition, severe 11/19/2016  . LLQ pain   . LUQ abdominal pain   . Intractable nausea and vomiting 11/17/2016  . Abdominal pain 11/05/2016  . Multiple myeloma ([place]) 06/18/2016  . Malnutrition of moderate degree 06/14/2016  . 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 Exertional dyspnea 11/01/2015  . Coronary atherosclerosis of native coronary artery 11/01/2015  . Shortness of breath 11/01/2015  . Chest pain 12/31/2011    Past Surgical History:  Procedure Laterality Date  . APPENDECTOMY    . BACK SURGERY    . CATARACT EXTRACTION W/ INTRAOCULAR LENS IMPLANT Right 2014  . CORONARY ANGIOGRAPHY N/A 01/23/2017   Procedure: Coronary Angiography;  Surgeon: [name], MD;  Location: [facility];  Service: Cardiovascular;  Laterality: N/A;  . CORONARY ANGIOPLASTY WITH STENT PLACEMENT  1999  . CORONARY STENT INTERVENTION N/A 12/29/2016   Procedure: Coronary  Stent Intervention;  Surgeon: Charolette Forward, MD;  Location: [facility];  Service: Cardiovascular;  Laterality: N/A;  . CORONARY STENT INTERVENTION N/A 02/24/2017   Procedure: Coronary Stent Intervention;  Surgeon: Charolette Forward, MD;  Location: [facility];  Service: Cardiovascular;  Laterality: N/A;  . ESOPHAGOGASTRODUODENOSCOPY N/A 11/03/2015   Procedure: ESOPHAGOGASTRODUODENOSCOPY (EGD);  Surgeon: Carol Ada, MD;  Location: [facility] END[facility];  Service: Endoscopy;  Laterality: N/A;  . ESOPHAGOGASTRODUODENOSCOPY (EGD) WITH PROPOFOL  N/A 11/18/2016   Procedure: ESOPHAGOGASTRODUODENOSCOPY (EGD) WITH PROPOFOL;  Surgeon: Ladene Artist, MD;  Location: [facility] ENDOSCOPY;  Service: Endoscopy;  Laterality: N/A;  . LEFT HEART CATH AND CORONARY ANGIOGRAPHY N/A 12/24/2016   Procedure: Left Heart Cath and Coronary Angiography;  Surgeon: [name], MD;  Location: [facility];  Service: Cardiovascular;  Laterality: N/A;  . LEFT HEART CATH AND CORONARY ANGIOGRAPHY N/A 02/24/2017   Procedure: Left Heart Cath and Coronary Angiography;  Surgeon: Charolette Forward, MD;  Location: [facility];  Service: Cardiovascular;  Laterality: N/A;  . LUMBAR [place]    . RENAL ARTERY STENT  1999   bil RAS so ? bil vs unilateral stents  . RETINAL LASER PROCEDURE Right 2012   "bleeding"  . TONSILLECTOMY    . TOTAL ABDOMINAL HYSTERECTOMY      OB History    No data available       Home Medications    Prior to Admission medications   Medication Sig Start Date End Date Taking? Authorizing Provider  acyclovir (ZOVIRAX) 400 MG tablet TAKE 1 TABLET (400 MG TOTAL) BY MOUTH 2 (TWO) TIMES DAILY. 08/29/16  Yes Ladell Pier, MD  amiodarone (PACERONE) 200 MG tablet Take 1 tablet (200 mg total) by mouth daily. 03/02/17  Yes [name], MD  Ascorbic Acid (VITAMIN C) 1000 MG tablet Take 1,000 mg by mouth daily.    Yes [provider]  aspirin EC 81 MG tablet Take 81 mg by mouth daily.   Yes [provider]  atorvastatin (LIPITOR) 80 MG tablet Take 1 tablet (80 mg total) by mouth daily at 6 PM. 03/01/17  Yes [name], MD  calcium carbonate (OS-CAL - DOSED IN MG OF ELEMENTAL CALCIUM) 1250 (500 Ca) MG tablet Take 1 tablet by mouth daily with breakfast.   Yes [provider]  Cholecalciferol (VITAMIN D PO) Take 1 capsule by mouth daily.   Yes [provider]  DULoxetine (CYMBALTA) 20 MG capsule Take 1 capsule (20 mg total) by mouth daily. 12/10/16  Yes Lavina Hamman, MD  feeding supplement, ENSURE ENLIVE,  (ENSURE ENLIVE) LIQD Take 237 mLs by mouth 2 (two) times daily between meals. Patient taking differently: Take 237 mLs by mouth See admin instructions. Drink 285m by mouth 2-3 times daily 11/27/16  Yes NMariel Aloe MD  ferrous sulfate 325 (65 FE) MG tablet Take 1 tablet (325 mg total) by mouth daily with breakfast. 03/02/17  Yes KDixie Dials MD  lenalidomide (REVLIMID) 15 MG capsule Take 1 capsule (15 mg total) by mouth daily. For 2 weeks then 2 weeks off. 02/12/17  Yes SLadell Pier MD  mirtazapine (REMERON) 15 MG tablet Take 1 tablet (15 mg total) by mouth at bedtime. Patient taking differently: Take 30 mg by mouth at bedtime.  12/27/16  Yes KDixie Dials MD  pantoprazole (PROTONIX) 40 MG tablet Take 1 tablet (40 mg total) by mouth 2 (two) times daily before a meal. 11/21/16  Yes Hongalgi, ALenis Dickinson MD  polyethylene glycol (MIRALAX / GLYCOLAX) packet Take 17 g by mouth daily as needed for moderate constipation.    Yes [provider]  potassium chloride (K-DUR,KLOR-CON) 10 MEQ tablet Take 1 tablet (10 mEq total) by mouth every Monday, Wednesday, and Friday. 03/02/17  Yes [name], MD  pyridostigmine (MESTINON) 60 MG tablet Take 0.5 tablets (30 mg total) by mouth 2 (two) times daily. 01/25/17  Yes [name], MD  ticagrelor (BRILINTA) 90 MG TABS tablet Take 1 tablet (90 mg total) by mouth 2 (two) times daily. 12/27/16  Yes [name], MD  dexamethasone (DECADRON) 4 MG tablet Take 5 tablets (20 mg total) by mouth every Thursday. Patient not taking: Reported on 03/04/2017 03/05/17   [name], MD  digoxin (LANOXIN) 0.125 MG tablet Take 0.5 tablets (0.0625 mg total) by mouth daily. Patient not taking: Reported on 03/04/2017 03/02/17   [name], MD    Family History Family History  Problem Relation Age of Onset  . Colon cancer Neg Hx     Social History Social History  Substance Use Topics  . Smoking status: Current Every Day Smoker    Packs/day: 0.50    Years: 50.00      Types: Cigarettes  . Smokeless tobacco: Never Used  . Alcohol use No     Allergies   Sulfa antibiotics   Review of Systems Review of Systems  Constitutional: Negative for chills and fever.  HENT: Negative for congestion and sore throat.   Eyes: Negative for visual disturbance.  Respiratory: Negative for cough, shortness of breath and wheezing.   Cardiovascular: Positive for chest pain. Negative for palpitations and leg swelling.  Gastrointestinal: Negative for abdominal pain, diarrhea, nausea and vomiting.  Genitourinary: Negative for dysuria.  Musculoskeletal: Negative for back pain and neck pain.  Skin: Negative for rash.  Neurological: Negative for weakness, light-headedness and headaches.     Physical Exam Updated Vital Signs BP 124/68   Pulse 67   Temp 98.6 F (37 C) (Oral)   Resp 17   SpO2 100%   Physical Exam  Constitutional: She appears well-developed and well-nourished. No distress.  Nontoxic appearing.  HENT:  Head: Normocephalic and atraumatic.  Mouth/Throat: Oropharynx is clear and moist.  Eyes: Conjunctivae are normal. Pupils are equal, round, and reactive to light. Right eye exhibits no discharge. Left eye exhibits no discharge.  Neck: Neck supple.  Cardiovascular: Normal rate, normal heart sounds and intact distal pulses.  Exam reveals no gallop and no friction rub.   No murmur heard. Irregular irregular rhythm. A. fib on the monitor. HR in 70s.  Bilateral radial, posterior tibialis and dorsalis pedis pulses are intact.    Pulmonary/Chest: Effort normal and breath sounds normal. No respiratory distress. She has no wheezes. She has no rales. She exhibits tenderness.  Lungs are clear to ascultation bilaterally. Symmetric chest expansion bilaterally. No increased work of breathing. No rales or rhonchi.   Substernal chest wall is TTP and reproduces her chest pain.   Abdominal: Soft. There is no tenderness.  Musculoskeletal: She exhibits no edema or  tenderness.  No lower extremity edema or tenderness.  Lymphadenopathy:    She has no cervical adenopathy.  Neurological: She is alert. Coordination normal.  Skin: Skin is warm and dry. Capillary refill takes less than 2 seconds. No rash noted. She is not diaphoretic. No erythema. No pallor.  Psychiatric: She has a normal mood and affect. Her behavior is normal.  Nursing note and vitals reviewed.    ED  Treatments / Results  Labs (all labs ordered are listed, but only abnormal results are displayed) Labs Reviewed  BASIC METABOLIC PANEL - Abnormal; Notable for the following:       Result Value   Potassium 3.2 (*)    Glucose, Bld 100 (*)    Calcium 8.8 (*)    All other components within normal limits  CBC WITH DIFFERENTIAL/PLATELET - Abnormal; Notable for the following:    RBC 3.50 (*)    Hemoglobin 10.3 (*)    HCT 32.0 (*)    RDW 16.5 (*)    Monocytes Absolute 1.5 (*)    All other components within normal limits  I-STAT TROPOININ, ED - Abnormal; Notable for the following:    Troponin i, poc 3.45 (*)    All other components within normal limits    EKG  EKG Interpretation  Date/Time:  Wednesday Mar 04 2017 05:58:14 EDT Ventricular Rate:  85 PR Interval:    QRS Duration: 85 QT Interval:  409 QTC Calculation: 498 R Axis:   111 Text Interpretation:  Sinus rhythm Multiple premature complexes, vent & supraven Right axis deviation Nonspecific T abnormalities, lateral leads Borderline ST elevation, anterior leads Borderline prolonged QT interval No significant change since last tracing Confirmed by Fredia Sorrow [phone]) on 03/04/2017 10:52:03 AM       Radiology Dg Chest 2 View  Result Date: 03/04/2017 CLINICAL DATA:  Chest pain.  Nausea. EXAM: CHEST  2 VIEW COMPARISON:  Radiographs 02/26/2017 FINDINGS: Unchanged heart size and mediastinal contours with mild cardiomegaly and atherosclerosis of the aortic arch. Coronary stent is visualized. Small bilateral pleural effusions,  left greater than right. Adjacent left greater than right lung base airspace opacities. No pulmonary edema. No pneumothorax. No acute osseous abnormality. IMPRESSION: Small bilateral pleural effusions and adjacent atelectasis, left greater than right. Stable cardiomegaly and atherosclerosis of the aortic arch. Electronically Signed   By: Jeb Levering M.D.   On: 03/04/2017 06:49    Procedures Procedures (including critical care time)  CRITICAL CARE Performed by: Hanley Hays   Total critical care time: 40 minutes  Critical care time was exclusive of separately billable procedures and treating other patients.  Critical care was necessary to treat or prevent imminent or life-threatening deterioration.  Critical care was time spent personally by me on the following activities: development of treatment plan with patient and/or surrogate as well as nursing, discussions with consultants, evaluation of patient's response to treatment, examination of patient, obtaining history from patient or surrogate, ordering and performing treatments and interventions, ordering and review of laboratory studies, ordering and review of radiographic studies, pulse oximetry and re-evaluation of patient's condition.   Medications Ordered in ED Medications  nitroGLYCERIN 50 mg in dextrose 5 % 250 mL (0.2 mg/mL) infusion (not administered)  aspirin chewable tablet 324 mg (324 mg Oral Given 03/04/17 0658)  nitroGLYCERIN (NITROGLYN) 2 % ointment 1 inch (1 inch Topical Given 03/04/17 2992)     Initial Impression / Assessment and Plan / ED Course  I have reviewed the triage vital signs and the nursing notes.  Pertinent labs & imaging results that were available during my care of the patient were reviewed by me and considered in my medical decision making (see chart for details).    This is a 71 y.o. Female with a history of MI, A-fib, multiple myeloma, and HTN who presents to the ED complaining of left  sided chest pain since 4 am this morning. Patient reports she was awake watching TV  when her pain began. She denies any associated symptoms such as shortness of breath. . She does have a history of recent cardiac catheterization. Seven days ago on her cath she had total occlusion of proximal LAD and significant LCX disease treated with drug-eluting stents in LAD and LCX. She is followed by cardiologist Dr. Doylene Canard.  On exam the patient is afebrile nontoxic appearing. Lungs clear to auscultation bilaterally. EKG appears unchanged from her last tracing. She does have reproducible pain with palpation of her chest wall.   Initial troponin returned elevated at 3.45. I do see that 7 days ago patient had a troponin of greater than 65. This could be downtrending from this. Elevated troponin where she had stenting placed or this could be a new elevated troponin. We'll provide the patient with aspirin and Nitropaste. No evidence of STEMI on EKG.    Other blood work is unremarkable. Chest x-ray shows small bilateral pleural effusions and adjacent atelectasis. Stable cardiomegaly.  At reevaluation the Patient's chest pain pain resolved with Nitropaste. Stable blood pressure.   I consulted with cardiologist Dr. Doylene Canard who will be down to see the patient.   Will start nitro drip. Patient tolerating well and relieves her chest pain. She agrees with plan for admission.   This patient was discussed with and evaluated by Dr. Dina Rich who agrees with assessment and plan.    Final Clinical Impressions(s) / ED Diagnoses   Final diagnoses:  Precordial pain  Elevated troponin  CAD, multiple vessel  Essential hypertension  PAF (paroxysmal atrial fibrillation) [facility])    New Prescriptions New Prescriptions   No medications on file     Waynetta Pean, Hershal Coria 03/04/17 1058    Horton, Barbette Hair, MD 03/11/17 [phone]

## 2017-03-04 NOTE — Progress Notes (Signed)
Patient admitted to room 2W26 from the ED, A&Ox4 and chest pain free. Tele applied and CCMD notified. Patient oriented to room and call light placed within reach. Patient denies any needs at this time.    03/04/17 1325  Vitals  Temp 98.5 F (36.9 C)  Temp Source Oral  BP 124/73  BP Location Right Arm  BP Method Automatic  Patient Position (if appropriate) Lying  Pulse Rate 62  Pulse Rate Source Dinamap  Resp 16  Oxygen Therapy  SpO2 100 %

## 2017-03-04 NOTE — Progress Notes (Signed)
[facility] for heparin Indication: chest pain/ACS  Allergies  Allergen Reactions  . Sulfa Antibiotics Rash    Patient Measurements: Weight: 136 lb 14.4 oz (62.1 kg) Heparin Dosing Weight: 60kg  Vital Signs: Temp: 98.5 F (36.9 C) (05/16 1325) Temp Source: Oral (05/16 1325) BP: 124/73 (05/16 1325) Pulse Rate: 62 (05/16 1325)  Labs:  Recent Labs  03/04/17 0612 03/04/17 0830 03/04/17 1348 03/04/17 1744  HGB 10.3*  --   --   --   HCT 32.0*  --   --   --   PLT 372  --   --   --   HEPARINUNFRC  --   --   --  0.10*  CREATININE 0.86  --   --   --   TROPONINI  --  3.61* 2.96*  --     Estimated Creatinine Clearance: 54.8 mL/min (by C-G formula based on SCr of 0.86 mg/dL).    Assessment: 8 yof presented to the ED with CP. She had a recent cardiac cath on May 8. Troponin elevated and now restarting IV heparin. Baseline H/H is slightly low but platelets are WNL. She is not on anticoagulation PTA.   Initial heparin level = 0.10  Goal of Therapy:  Heparin level 0.3-0.7 units/ml Monitor platelets by anticoagulation protocol: Yes   Plan:  Increase heparin to 600 units / hr Daily heparin level and CBC  Thank you Anette Guarneri, PharmD [phone] 03/04/2017,7:02 PM

## 2017-03-05 ENCOUNTER — Encounter (HOSPITAL_COMMUNITY)
Admission: EM | Disposition: A | Discharge status: Home Health | Payer: Self-pay | Source: Home / Self Care | Attending: Cardiovascular Disease

## 2017-03-05 ENCOUNTER — Telehealth: Payer: Self-pay

## 2017-03-05 ENCOUNTER — Encounter (HOSPITAL_COMMUNITY): Payer: Self-pay | Admitting: Cardiovascular Disease

## 2017-03-05 HISTORY — PX: LEFT HEART CATH AND CORONARY ANGIOGRAPHY: CATH118249

## 2017-03-05 LAB — CBC
HCT: 28.1 % — ABNORMAL LOW (ref 36.0–46.0)
HEMOGLOBIN: 9.2 g/dL — AB (ref 12.0–15.0)
MCH: 30.1 pg (ref 26.0–34.0)
MCHC: 32.7 g/dL (ref 30.0–36.0)
MCV: 91.8 fL (ref 78.0–100.0)
Platelets: 360 10*3/uL (ref 150–400)
RBC: 3.06 MIL/uL — ABNORMAL LOW (ref 3.87–5.11)
RDW: 16.7 % — ABNORMAL HIGH (ref 11.5–15.5)
WBC: 8.4 10*3/uL (ref 4.0–10.5)

## 2017-03-05 LAB — LIPID PANEL
CHOL/HDL RATIO: 2.9 ratio
CHOLESTEROL: 174 mg/dL (ref 0–200)
HDL: 60 mg/dL (ref >40)
LDL Cholesterol: 92 mg/dL (ref 0–99)
TRIGLYCERIDES: 109 mg/dL (ref <150)
VLDL: 22 mg/dL (ref 0–40)

## 2017-03-05 LAB — BASIC METABOLIC PANEL
ANION GAP: 10 (ref 5–15)
BUN: 6 mg/dL (ref 6–20)
CHLORIDE: 109 mmol/L (ref 101–111)
CO2: 22 mmol/L (ref 22–32)
Calcium: 8.2 mg/dL — ABNORMAL LOW (ref 8.9–10.3)
Creatinine, Ser: 0.85 mg/dL (ref 0.44–1.00)
GFR calc Af Amer: >60 mL/min (ref >60)
GFR calc non Af Amer: >60 mL/min (ref >60)
Glucose, Bld: 87 mg/dL (ref 65–99)
Potassium: 3.2 mmol/L — ABNORMAL LOW (ref 3.5–5.1)
Sodium: 141 mmol/L (ref 135–145)

## 2017-03-05 LAB — PROTIME-INR
INR: 1.18
PROTHROMBIN TIME: 15.1 s (ref 11.4–15.2)

## 2017-03-05 LAB — POCT ACTIVATED CLOTTING TIME: ACTIVATED CLOTTING TIME: 142 s

## 2017-03-05 LAB — HEPARIN LEVEL (UNFRACTIONATED): Heparin Unfractionated: 0.12 IU/mL — ABNORMAL LOW (ref 0.30–0.70)

## 2017-03-05 SURGERY — LEFT HEART CATH AND CORONARY ANGIOGRAPHY
Anesthesia: LOCAL

## 2017-03-05 MED ORDER — IOPAMIDOL (ISOVUE-370) INJECTION 76%
INTRAVENOUS | Status: DC | PRN
  Administered 2017-03-05: 30 mL via INTRA_ARTERIAL

## 2017-03-05 MED ORDER — HEPARIN (PORCINE) IN NACL 2-0.9 UNIT/ML-% IJ SOLN
INTRAMUSCULAR | Status: AC
  Filled 2017-03-05: qty 1000

## 2017-03-05 MED ORDER — FENTANYL CITRATE (PF) 100 MCG/2ML IJ SOLN
INTRAMUSCULAR | Status: DC | PRN
  Administered 2017-03-05: 25 ug via INTRAVENOUS

## 2017-03-05 MED ORDER — SODIUM CHLORIDE 0.9% FLUSH
3.0000 mL | Freq: Two times a day (BID) | INTRAVENOUS | Status: DC
  Administered 2017-03-05: 3 mL via INTRAVENOUS

## 2017-03-05 MED ORDER — SODIUM CHLORIDE 0.9 % IV SOLN: 250.0000 mL | INTRAVENOUS | Status: DC | PRN

## 2017-03-05 MED ORDER — SODIUM CHLORIDE 0.9 % IV SOLN: INTRAVENOUS | Status: AC

## 2017-03-05 MED ORDER — SODIUM CHLORIDE 0.9% FLUSH: 3.0000 mL | INTRAVENOUS | Status: DC | PRN

## 2017-03-05 MED ORDER — NITROGLYCERIN IN D5W 200-5 MCG/ML-% IV SOLN: 5.0000 ug/min | INTRAVENOUS | Status: DC

## 2017-03-05 MED ORDER — OXYCODONE HCL 5 MG PO TABS
5.0000 mg | ORAL_TABLET | Freq: Four times a day (QID) | ORAL | Status: DC | PRN
  Administered 2017-03-05 – 2017-03-11 (×14): 5 mg via ORAL
  Filled 2017-03-05 (×15): qty 1

## 2017-03-05 MED ORDER — MIDAZOLAM HCL 2 MG/2ML IJ SOLN
INTRAMUSCULAR | Status: DC | PRN
  Administered 2017-03-05: 1 mg via INTRAVENOUS

## 2017-03-05 MED ORDER — HEPARIN BOLUS VIA INFUSION
1500.0000 [IU] | Freq: Once | INTRAVENOUS | Status: AC
  Administered 2017-03-05: 1500 [IU] via INTRAVENOUS
  Filled 2017-03-05: qty 1500

## 2017-03-05 MED ORDER — LIDOCAINE HCL 1 % IJ SOLN
INTRAMUSCULAR | Status: AC
  Filled 2017-03-05: qty 20

## 2017-03-05 MED ORDER — LIDOCAINE HCL (PF) 1 % IJ SOLN
INTRAMUSCULAR | Status: DC | PRN
  Administered 2017-03-05: 20 mL via SUBCUTANEOUS

## 2017-03-05 MED ORDER — FENTANYL CITRATE (PF) 100 MCG/2ML IJ SOLN
INTRAMUSCULAR | Status: AC
  Filled 2017-03-05: qty 2

## 2017-03-05 MED ORDER — HEPARIN (PORCINE) IN NACL 2-0.9 UNIT/ML-% IJ SOLN
INTRAMUSCULAR | Status: AC | PRN
  Administered 2017-03-05: 1000 mL

## 2017-03-05 MED ORDER — MIDAZOLAM HCL 2 MG/2ML IJ SOLN
INTRAMUSCULAR | Status: AC
  Filled 2017-03-05: qty 2

## 2017-03-05 MED ORDER — SODIUM CHLORIDE 0.9% FLUSH
3.0000 mL | Freq: Two times a day (BID) | INTRAVENOUS | Status: DC
  Administered 2017-03-05 – 2017-03-07 (×4): 3 mL via INTRAVENOUS

## 2017-03-05 MED ORDER — SODIUM CHLORIDE 0.9 % IV SOLN
250.0000 mL | INTRAVENOUS | Status: DC | PRN
  Administered 2017-03-06: 250 mL via INTRAVENOUS

## 2017-03-05 MED ORDER — POTASSIUM CHLORIDE CRYS ER 20 MEQ PO TBCR
20.0000 meq | EXTENDED_RELEASE_TABLET | Freq: Once | ORAL | Status: AC
  Administered 2017-03-05: 20 meq via ORAL
  Filled 2017-03-05: qty 1

## 2017-03-05 MED ORDER — IOPAMIDOL (ISOVUE-370) INJECTION 76%
INTRAVENOUS | Status: AC
  Filled 2017-03-05: qty 100

## 2017-03-05 MED ORDER — SODIUM CHLORIDE 0.9 % IV SOLN
INTRAVENOUS | Status: DC
  Administered 2017-03-05: 04:00:00 via INTRAVENOUS

## 2017-03-05 MED ORDER — ENSURE ENLIVE PO LIQD
237.0000 mL | Freq: Three times a day (TID) | ORAL | Status: DC
  Administered 2017-03-05 – 2017-03-11 (×10): 237 mL via ORAL

## 2017-03-05 MED ORDER — MORPHINE SULFATE (PF) 4 MG/ML IV SOLN
2.0000 mg | Freq: Once | INTRAVENOUS | Status: AC
  Administered 2017-03-05: 2 mg via INTRAVENOUS
  Filled 2017-03-05: qty 1

## 2017-03-05 SURGICAL SUPPLY — 6 items
CATH INFINITI MULTIPACK ST 5F (CATHETERS) ×1 IMPLANT
GUIDEWIRE 3MM J TIP .035 145 (WIRE) ×1 IMPLANT
KIT HEART LEFT (KITS) ×2 IMPLANT
PACK CARDIAC CATHETERIZATION (CUSTOM PROCEDURE TRAY) ×2 IMPLANT
SHEATH PINNACLE 5F 10CM (SHEATH) ×1 IMPLANT
TRANSDUCER W/STOPCOCK (MISCELLANEOUS) ×2 IMPLANT

## 2017-03-05 NOTE — Progress Notes (Signed)
[facility] for heparin Indication: chest pain/ACS  Allergies  Allergen Reactions  . Sulfa Antibiotics Rash    Patient Measurements: Height: 5\' 5"  (165.1 cm) Weight: 129 lb 1.6 oz (58.6 kg) IBW/kg (Calculated) : 57 Heparin Dosing Weight: 60kg  Vital Signs: Temp: 98.9 F (37.2 C) (05/17 0322) Temp Source: Oral (05/17 0322) BP: 111/59 (05/17 0322) Pulse Rate: 63 (05/17 0322)  Labs:  Recent Labs  03/04/17 0612 03/04/17 0830 03/04/17 1348 03/04/17 1744 03/04/17 2031 03/05/17 0304  HGB 10.3*  --   --   --   --  9.2*  HCT 32.0*  --   --   --   --  28.1*  PLT 372  --   --   --   --  360  LABPROT  --   --   --   --   --  15.1  INR  --   --   --   --   --  1.18  HEPARINUNFRC  --   --   --  0.10*  --  0.12*  CREATININE 0.86  --   --   --   --   --   TROPONINI  --  3.61* 2.96*  --  2.80*  --     Estimated Creatinine Clearance: 54.8 mL/min (by C-G formula based on SCr of 0.86 mg/dL).  Assessment: 82 yof on heparin for r/o ACS. Heparin level subtherapeutic (0.12) on gtt at 400 units/hr. Heparin rate was supposed to be increased to 600 units/hr earlier but pharmacist forgot to put in order so still running at 400 units/hr. Hgb down a bit, no bleeding noted. No issues with line per RN. For cath today.  Goal of Therapy:  Heparin level 0.3-0.7 units/ml Monitor platelets by anticoagulation protocol: Yes   Plan:  Bolus heparin 1500 units Increase heparin gtt to 650 units/hr Will f/u 8 hr heparin level  Sherlon Handing, PharmD, BCPS Clinical pharmacist, pager [phone] 03/05/2017,4:14 AM

## 2017-03-05 NOTE — Progress Notes (Addendum)
Initial Nutrition Assessment  DOCUMENTATION CODES:   Severe malnutrition in context of chronic illness  INTERVENTION:    Continue Ensure Enlive po TID, each supplement provides 350 kcal and 20 grams of protein  NUTRITION DIAGNOSIS:   Malnutrition (chronic) related to chronic illness (multiple myeloma) as evidenced by mild depletion of body fat, severe depletion of muscle mass, energy intake < or equal to 75% for > or equal to 1 month, percent weight loss (20% x 3.5 months)  GOAL:   Patient will meet greater than or equal to 90% of their needs  MONITOR:   PO intake, Supplement acceptance, Labs, Weight trends, I & O's  REASON FOR ASSESSMENT:   Malnutrition Screening Tool  ASSESSMENT:   71 yo Female with recent antero-lateral wall MI and Stent in LAD and LCx has chest pain, mid chest and non-radiating, early this AM, responding to SL NTG. She has some nausea but no shortness of breath.   S/p cardiac cath this AM.  Pt reports a decreased appetite PTA.   Typically consumes 2 meals per day; likes frozen dinners.   Does drink Ensure Enlive supplements at home; 3 per day.  Also endorses a 30-40 lb weight loss since 10/2016.  Severe for time frame. Medications reviewed and include Remeron, ferrous sulfate and Vitamin C. Labs reviewed.  Potassium 3.2 (L).  Nutrition-Focused physical exam completed. Findings are mild fat depletion, severe muscle depletion, and no edema.   Diet Order:  Diet Heart Room service appropriate? Yes; Fluid consistency: Thin  Skin:  Reviewed, no issues  Last BM:  5/15  Height:   Ht Readings from Last 1 Encounters:  03/04/17 '5\' 5"'  (1.651 m)   Weight:   Wt Readings from Last 1 Encounters:  03/05/17 126 lb 8 oz (57.4 kg)   Wt Readings from Last 15 Encounters:  03/05/17 126 lb 8 oz (57.4 kg)  02/25/17 134 lb (60.8 kg)  02/12/17 131 lb 12.8 oz (59.8 kg)  01/29/17 132 lb 4.8 oz (60 kg)  01/25/17 131 lb (59.4 kg)  01/08/17 132 lb 14.4 oz (60.3  kg)  01/02/17 138 lb 0.1 oz (62.6 kg)  12/20/16 140 lb (63.5 kg)  12/18/16 140 lb 8 oz (63.7 kg)  12/11/16 146 lb 6.4 oz (66.4 kg)  12/09/16 139 lb 9.5 oz (63.3 kg)  12/04/16 146 lb 6 oz (66.4 kg)  12/03/16 146 lb 14.4 oz (66.6 kg)  11/25/16 156 lb (70.8 kg)  11/18/16 159 lb (72.1 kg)   Ideal Body Weight:  56.8 kg  BMI:  Body mass index is 21.05 kg/m.  Estimated Nutritional Needs:   Kcal:  1700-1900  Protein:  85-100 gm  Fluid:  1.7-1.9 L  EDUCATION NEEDS:   No education needs identified at this time  Arthur Holms, RD, LDN Pager #: [phone] After-Hours Pager #: [phone]

## 2017-03-05 NOTE — Progress Notes (Signed)
Patient complained of pain in left leg, unable to bear weight on left leg when OOB to [facility]. Homans done, no signs of swelling, calf warmness, but when barely touched or lifted patient grimaces with pain. Rates 4/10. Contacted CARDs to see who on call for Dr. Doylene Canard, only to find that he is performing heart cath. Report given to Cath lab technician, asked to relay message to Dr. Doylene Canard in case further intervention needed.

## 2017-03-05 NOTE — Telephone Encounter (Signed)
Pt called stating she is in hospital and will not make tomorrow's appt at [facility]. She is in hospital for chest pains. She had just been discharged Sunday after being treated for MI.

## 2017-03-05 NOTE — Progress Notes (Signed)
Resting in bed, no distress, denies pain, no hematoma or bleeding at site. Vitals have been stable.

## 2017-03-05 NOTE — Progress Notes (Signed)
Report received from Joyice Faster, RN-Cath lab; S/P right groin heart cath, no changes from last cath, stent is patient .

## 2017-03-05 NOTE — Progress Notes (Signed)
Patient moved from bed to chair before bedrest up, right groin minimal bleed, applied pressure for 30 minutes plus, reinforced dressing, returned to bed with 4 person assist, tolerated well. Frequent vitals started. Notified Dr. Doylene Canard of incident and interventions done. No new orders received.

## 2017-03-05 NOTE — Progress Notes (Signed)
Ref: Nolene Ebbs, MD   Subjective:  Episode of chest pain early this AM requiring NTG Sl, drip and nitroglycerin. Now has left lower leg pain.  Objective:  Vital Signs in the last 24 hours: Temp:  [98.5 F (36.9 C)-98.9 F (37.2 C)] 98.9 F (37.2 C) (05/17 0322) Pulse Rate:  [52-98] 63 (05/17 0322) Cardiac Rhythm: Sinus bradycardia (05/17 0700) Resp:  [13-18] 18 (05/17 0322) BP: (98-143)/(52-93) 111/59 (05/17 0322) SpO2:  [98 %-100 %] 100 % (05/17 0322) Weight:  [57.4 kg (126 lb 8 oz)-62.1 kg (136 lb 14.4 oz)] 57.4 kg (126 lb 8 oz) (05/17 0435)  Physical Exam: BP Readings from Last 1 Encounters:  03/05/17 (!) 111/59    Wt Readings from Last 1 Encounters:  03/05/17 57.4 kg (126 lb 8 oz)    Weight change:  Body mass index is 21.05 kg/m. HEENT: [place]/AT, Eyes-Brown, PERL, EOMI, Conjunctiva-Pale pink, Sclera-Non-icteric Neck: No JVD, No bruit, Trachea midline. Lungs:  Clear, Bilateral. Cardiac:  Regular rhythm, normal S1 and S2, no S3. II/VI systolic murmur. Abdomen:  Soft, non-tender. BS present. Extremities:  No edema present. No cyanosis. No clubbing. Left lower leg tender. CNS: AxOx3, Cranial nerves grossly intact, moves all 4 extremities.  Skin: Warm and dry.   Intake/Output from previous day: 05/16 0701 - 05/17 0700 In: 450.3 [P.O.:240; I.V.:210.3] Out: 600 [Urine:600]    Lab Results: BMET    Component Value Date/Time   NA 141 03/05/2017 0304   NA 141 03/04/2017 0612   NA 141 02/28/2017 0550   NA 143 02/12/2017 1035   NA 145 01/29/2017 1428   NA 140 12/18/2016 0932   K 3.2 (L) 03/05/2017 0304   K 3.2 (L) 03/04/2017 0612   K 4.6 02/28/2017 0550   K 3.8 02/12/2017 1035   K 3.7 01/29/2017 1428   K 4.9 12/18/2016 0932   CL 109 03/05/2017 0304   CL 107 03/04/2017 0612   CL 114 (H) 02/28/2017 0550   CO2 22 03/05/2017 0304   CO2 23 03/04/2017 0612   CO2 21 (L) 02/28/2017 0550   CO2 23 02/12/2017 1035   CO2 25 01/29/2017 1428   CO2 24 12/18/2016 0932   GLUCOSE 87 03/05/2017 0304   GLUCOSE 100 (H) 03/04/2017 0612   GLUCOSE 94 02/28/2017 0550   GLUCOSE 106 02/12/2017 1035   GLUCOSE 127 01/29/2017 1428   GLUCOSE 138 12/18/2016 0932   BUN 6 03/05/2017 0304   BUN 7 03/04/2017 0612   BUN 9 02/28/2017 0550   BUN 20.6 02/12/2017 1035   BUN 19.1 01/29/2017 1428   BUN 16.8 12/18/2016 0932   CREATININE 0.85 03/05/2017 0304   CREATININE 0.86 03/04/2017 0612   CREATININE 0.71 02/28/2017 0550   CREATININE 1.0 02/12/2017 1035   CREATININE 0.9 01/29/2017 1428   CREATININE 1.7 (H) 12/18/2016 0932   CALCIUM 8.2 (L) 03/05/2017 0304   CALCIUM 8.8 (L) 03/04/2017 0612   CALCIUM 7.7 (L) 02/28/2017 0550   CALCIUM 9.3 02/12/2017 1035   CALCIUM 9.2 01/29/2017 1428   CALCIUM 9.9 12/18/2016 0932   GFRNONAA >60 03/05/2017 0304   GFRNONAA >60 03/04/2017 0612   GFRNONAA >60 02/28/2017 0550   GFRAA >60 03/05/2017 0304   GFRAA >60 03/04/2017 0612   GFRAA >60 02/28/2017 0550   CBC    Component Value Date/Time   WBC 8.4 03/05/2017 0304   RBC 3.06 (L) 03/05/2017 0304   HGB 9.2 (L) 03/05/2017 0304   HGB 10.1 (L) 02/12/2017 1035   HCT 28.1 (L)  03/05/2017 0304   HCT 31.9 (L) 02/12/2017 1035   PLT 360 03/05/2017 0304   PLT 258 02/12/2017 1035   MCV 91.8 03/05/2017 0304   MCV 95.8 02/12/2017 1035   MCH 30.1 03/05/2017 0304   MCHC 32.7 03/05/2017 0304   RDW 16.7 (H) 03/05/2017 0304   RDW 15.3 (H) 02/12/2017 1035   LYMPHSABS 0.8 03/04/2017 0612   LYMPHSABS 1.0 02/12/2017 1035   MONOABS 1.5 (H) 03/04/2017 0612   MONOABS 0.9 02/12/2017 1035   EOSABS 0.1 03/04/2017 0612   EOSABS 0.1 02/12/2017 1035   BASOSABS 0.0 03/04/2017 0612   BASOSABS 0.0 02/12/2017 1035   HEPATIC Function Panel  Recent Labs  02/25/17 0300 02/27/17 0230 02/28/17 0550  PROT 4.6* 4.8* 4.3*   HEMOGLOBIN A1C No components found for: HGA1C,  MPG CARDIAC ENZYMES Lab Results  Component Value Date   CKTOTAL 129 01/01/2012   CKMB 4.6 (H) 01/01/2012   TROPONINI 2.80 (HH)  03/04/2017   TROPONINI 2.96 (HH) 03/04/2017   TROPONINI 3.61 (HH) 03/04/2017   BNP No results for input(s): PROBNP in the last 8760 hours. TSH  Recent Labs  12/06/16 2005 12/18/16 1056 12/22/16 2008  TSH 0.541 0.576 0.481   CHOLESTEROL  Recent Labs  12/07/16 0605 02/24/17 2028 03/05/17 0304  CHOL 220* 196 174    Scheduled Meds: . [MAR Hold] acyclovir  400 mg Oral BID  . [MAR Hold] amiodarone  200 mg Oral Daily  . [MAR Hold] aspirin EC  81 mg Oral Daily  . [MAR Hold] atorvastatin  80 mg Oral q1800  . [MAR Hold] calcium carbonate  1 tablet Oral Q breakfast  . [MAR Hold] DULoxetine  20 mg Oral Daily  . [MAR Hold] feeding supplement (ENSURE ENLIVE)  237 mL Oral BID BM  . [MAR Hold] ferrous sulfate  325 mg Oral Q breakfast  . [MAR Hold] mirtazapine  15 mg Oral QHS  . [MAR Hold] pantoprazole  40 mg Oral BID AC  . [MAR Hold] potassium chloride  10 mEq Oral TID  . [MAR Hold] pyridostigmine  30 mg Oral BID  . sodium chloride flush  3 mL Intravenous Q12H  . [MAR Hold] ticagrelor  90 mg Oral BID  . [MAR Hold] vitamin C  1,000 mg Oral Daily   Continuous Infusions: . sodium chloride    . sodium chloride 100 mL/hr at 03/05/17 0600  . heparin 650 Units/hr (03/05/17 0646)  . [MAR Hold] nitroGLYCERIN 5 mcg/min (03/05/17 0330)   PRN Meds:.sodium chloride, [MAR Hold] acetaminophen, [MAR Hold] nitroGLYCERIN, [MAR Hold] ondansetron (ZOFRAN) IV, [MAR Hold] polyethylene glycol, sodium chloride flush  Assessment/Plan: Acute coronary syndrome Recent antero-lateral wall MI Multiple myeloma New left lower leg pain r/o DVT  Cardiac cath today. Lower ext. Venous duplex to r/o DVT.   LOS: 1 day    [name]  MD  03/05/2017, 10:56 AM

## 2017-03-05 NOTE — Progress Notes (Signed)
Heparin drip off on call to Cath lab. Nitroglycerin drip infusing at 1.5 ml/hr; NS infusing at 100 ml/hr.; left unit via bed, accompanied by cath lab technician

## 2017-03-05 NOTE — Progress Notes (Addendum)
Site area:RFA  Site Prior to Removal:  Level 0 Pressure Applied For:25 min Manual:   yes Patient Status During Pull: stable  Post Pull Site:  Level 0 Post Pull Instructions Given:  yes Post Pull Pulses Present: palpable Dressing Applied:  tegaderm Bedrest begins @ 1230 [id number] Comments:

## 2017-03-05 NOTE — Progress Notes (Signed)
Patient back from cath lab. No signs of distress.

## 2017-03-06 ENCOUNTER — Inpatient Hospital Stay (HOSPITAL_COMMUNITY): Payer: Medicare HMO

## 2017-03-06 ENCOUNTER — Ambulatory Visit: Payer: Medicare HMO | Admitting: Nurse Practitioner

## 2017-03-06 DIAGNOSIS — M79605 Pain in left leg: Secondary | ICD-10-CM

## 2017-03-06 LAB — CBC
HCT: 28.2 % — ABNORMAL LOW (ref 36.0–46.0)
Hemoglobin: 8.8 g/dL — ABNORMAL LOW (ref 12.0–15.0)
MCH: 28.9 pg (ref 26.0–34.0)
MCHC: 31.2 g/dL (ref 30.0–36.0)
MCV: 92.5 fL (ref 78.0–100.0)
PLATELETS: 396 10*3/uL (ref 150–400)
RBC: 3.05 MIL/uL — AB (ref 3.87–5.11)
RDW: 16.3 % — ABNORMAL HIGH (ref 11.5–15.5)
WBC: 8 10*3/uL (ref 4.0–10.5)

## 2017-03-06 LAB — BASIC METABOLIC PANEL
ANION GAP: 7 (ref 5–15)
BUN: 6 mg/dL (ref 6–20)
CHLORIDE: 113 mmol/L — AB (ref 101–111)
CO2: 22 mmol/L (ref 22–32)
Calcium: 7.8 mg/dL — ABNORMAL LOW (ref 8.9–10.3)
Creatinine, Ser: 0.75 mg/dL (ref 0.44–1.00)
GFR calc non Af Amer: >60 mL/min (ref >60)
Glucose, Bld: 82 mg/dL (ref 65–99)
POTASSIUM: 3.8 mmol/L (ref 3.5–5.1)
Sodium: 142 mmol/L (ref 135–145)

## 2017-03-06 MED ORDER — HEPARIN (PORCINE) IN NACL 100-0.45 UNIT/ML-% IJ SOLN
700.0000 [IU]/h | INTRAMUSCULAR | Status: DC
  Administered 2017-03-06: 850 [IU]/h via INTRAVENOUS
  Administered 2017-03-07 – 2017-03-09 (×2): 700 [IU]/h via INTRAVENOUS
  Filled 2017-03-06 (×3): qty 250

## 2017-03-06 MED ORDER — WARFARIN - PHYSICIAN DOSING INPATIENT
Freq: Every day | Status: DC
  Administered 2017-03-07 – 2017-03-08 (×2)

## 2017-03-06 MED ORDER — WARFARIN SODIUM 5 MG PO TABS
5.0000 mg | ORAL_TABLET | Freq: Every day | ORAL | Status: DC
  Administered 2017-03-06: 5 mg via ORAL
  Filled 2017-03-06: qty 1

## 2017-03-06 MED ORDER — HEPARIN BOLUS VIA INFUSION
3000.0000 [IU] | Freq: Once | INTRAVENOUS | Status: DC
  Filled 2017-03-06: qty 3000

## 2017-03-06 NOTE — Progress Notes (Signed)
*  PRELIMINARY RESULTS* Vascular Ultrasound Left lower extremity venous duplex has been completed.  Preliminary findings: Small segment of acute deep vein thrombosis seen in the proximal and mid gastrocnemius veins of the left calf muscle.  Other visualized veins of the left lower extremity appear negative for thrombosis.  Preliminary results called to Dr. Doylene Canard @ 11:26.  Everrett Coombe 03/06/2017, 11:23 AM

## 2017-03-06 NOTE — Progress Notes (Signed)
Heparin bolus not given, per Dr. Doylene Canard verbal order.

## 2017-03-06 NOTE — Progress Notes (Signed)
Ref: Nolene Ebbs, MD   Subjective:  Left lower leg DVT. X-ray of foot and tib-fib are without acute fracture.   Objective:  Vital Signs in the last 24 hours: Temp:  [98.7 F (37.1 C)-98.9 F (37.2 C)] 98.9 F (37.2 C) (05/18 1632) Pulse Rate:  [50-56] 50 (05/18 1632) Cardiac Rhythm: Normal sinus rhythm (05/18 0700) Resp:  [18] 18 (05/18 0457) BP: (98-110)/(53-64) 98/58 (05/18 1632) SpO2:  [94 %-97 %] 97 % (05/18 1632) Weight:  [57 kg (125 lb 9.6 oz)] 57 kg (125 lb 9.6 oz) (05/18 0457)  Physical Exam: BP Readings from Last 1 Encounters:  03/06/17 (!) 98/58    Wt Readings from Last 1 Encounters:  03/06/17 57 kg (125 lb 9.6 oz)    Weight change: -5.126 kg (-11 lb 4.8 oz) Body mass index is 20.9 kg/m. HEENT: [place]/AT, Eyes-Brown, PERL, EOMI, Conjunctiva-Pale pink, Sclera-Non-icteric Neck: No JVD, No bruit, Trachea midline. Lungs:  Clear, Bilateral. Cardiac:  Regular rhythm, normal S1 and S2, no S3. II/VI systolic murmur. Abdomen:  Soft, non-tender. BS present. Extremities:  No edema present. No cyanosis. No clubbing. Tenderness over left calf and foot. CNS: AxOx3, Cranial nerves grossly intact, moves all 4 extremities.  Skin: Warm and dry.   Intake/Output from previous day: 05/17 0701 - 05/18 0700 In: 573 [P.O.:270; I.V.:303] Out: 400 [Urine:400]    Lab Results: BMET    Component Value Date/Time   NA 142 03/06/2017 0402   NA 141 03/05/2017 0304   NA 141 03/04/2017 0612   NA 143 02/12/2017 1035   NA 145 01/29/2017 1428   NA 140 12/18/2016 0932   K 3.8 03/06/2017 0402   K 3.2 (L) 03/05/2017 0304   K 3.2 (L) 03/04/2017 0612   K 3.8 02/12/2017 1035   K 3.7 01/29/2017 1428   K 4.9 12/18/2016 0932   CL 113 (H) 03/06/2017 0402   CL 109 03/05/2017 0304   CL 107 03/04/2017 0612   CO2 22 03/06/2017 0402   CO2 22 03/05/2017 0304   CO2 23 03/04/2017 0612   CO2 23 02/12/2017 1035   CO2 25 01/29/2017 1428   CO2 24 12/18/2016 0932   GLUCOSE 82 03/06/2017 0402   GLUCOSE 87 03/05/2017 0304   GLUCOSE 100 (H) 03/04/2017 0612   GLUCOSE 106 02/12/2017 1035   GLUCOSE 127 01/29/2017 1428   GLUCOSE 138 12/18/2016 0932   BUN 6 03/06/2017 0402   BUN 6 03/05/2017 0304   BUN 7 03/04/2017 0612   BUN 20.6 02/12/2017 1035   BUN 19.1 01/29/2017 1428   BUN 16.8 12/18/2016 0932   CREATININE 0.75 03/06/2017 0402   CREATININE 0.85 03/05/2017 0304   CREATININE 0.86 03/04/2017 0612   CREATININE 1.0 02/12/2017 1035   CREATININE 0.9 01/29/2017 1428   CREATININE 1.7 (H) 12/18/2016 0932   CALCIUM 7.8 (L) 03/06/2017 0402   CALCIUM 8.2 (L) 03/05/2017 0304   CALCIUM 8.8 (L) 03/04/2017 0612   CALCIUM 9.3 02/12/2017 1035   CALCIUM 9.2 01/29/2017 1428   CALCIUM 9.9 12/18/2016 0932   GFRNONAA >60 03/06/2017 0402   GFRNONAA >60 03/05/2017 0304   GFRNONAA >60 03/04/2017 0612   GFRAA >60 03/06/2017 0402   GFRAA >60 03/05/2017 0304   GFRAA >60 03/04/2017 0612   CBC    Component Value Date/Time   WBC 8.0 03/06/2017 0402   RBC 3.05 (L) 03/06/2017 0402   HGB 8.8 (L) 03/06/2017 0402   HGB 10.1 (L) 02/12/2017 1035   HCT 28.2 (L) 03/06/2017 0402  HCT 31.9 (L) 02/12/2017 1035   PLT 396 03/06/2017 0402   PLT 258 02/12/2017 1035   MCV 92.5 03/06/2017 0402   MCV 95.8 02/12/2017 1035   MCH 28.9 03/06/2017 0402   MCHC 31.2 03/06/2017 0402   RDW 16.3 (H) 03/06/2017 0402   RDW 15.3 (H) 02/12/2017 1035   LYMPHSABS 0.8 03/04/2017 0612   LYMPHSABS 1.0 02/12/2017 1035   MONOABS 1.5 (H) 03/04/2017 0612   MONOABS 0.9 02/12/2017 1035   EOSABS 0.1 03/04/2017 0612   EOSABS 0.1 02/12/2017 1035   BASOSABS 0.0 03/04/2017 0612   BASOSABS 0.0 02/12/2017 1035   HEPATIC Function Panel  Recent Labs  02/25/17 0300 02/27/17 0230 02/28/17 0550  PROT 4.6* 4.8* 4.3*   HEMOGLOBIN A1C No components found for: HGA1C,  MPG CARDIAC ENZYMES Lab Results  Component Value Date   CKTOTAL 129 01/01/2012   CKMB 4.6 (H) 01/01/2012   TROPONINI 2.80 (HH) 03/04/2017   TROPONINI 2.96 (HH)  03/04/2017   TROPONINI 3.61 (HH) 03/04/2017   BNP No results for input(s): PROBNP in the last 8760 hours. TSH  Recent Labs  12/06/16 2005 12/18/16 1056 12/22/16 2008  TSH 0.541 0.576 0.481   CHOLESTEROL  Recent Labs  12/07/16 0605 02/24/17 2028 03/05/17 0304  CHOL 220* 196 174    Scheduled Meds: . acyclovir  400 mg Oral BID  . amiodarone  200 mg Oral Daily  . aspirin EC  81 mg Oral Daily  . atorvastatin  80 mg Oral q1800  . calcium carbonate  1 tablet Oral Q breakfast  . DULoxetine  20 mg Oral Daily  . feeding supplement (ENSURE ENLIVE)  237 mL Oral TID BM  . ferrous sulfate  325 mg Oral Q breakfast  . mirtazapine  15 mg Oral QHS  . pantoprazole  40 mg Oral BID AC  . potassium chloride  10 mEq Oral TID  . pyridostigmine  30 mg Oral BID  . sodium chloride flush  3 mL Intravenous Q12H  . ticagrelor  90 mg Oral BID  . vitamin C  1,000 mg Oral Daily  . warfarin  5 mg Oral q1800   Continuous Infusions: . sodium chloride 250 mL (03/06/17 1355)  . nitroGLYCERIN 5 mcg/min (03/05/17 0910)   PRN Meds:.sodium chloride, acetaminophen, nitroGLYCERIN, ondansetron (ZOFRAN) IV, oxyCODONE, polyethylene glycol, sodium chloride flush  Assessment/Plan: Acute left leg DVT CAD S/P stent in LAD and LCX Recent antero-lateral wall MI Multiple myeloma H/O GI bleed Anemia of blood loss Left ventricular thrombus  Restart IV heparin and start coumadin Coumadin booklet for education. Continue aspirin and Brilinta. Monitor H and H INR goal 2.5 to 3.   LOS: 2 days    [name]  MD  03/06/2017, 5:13 PM

## 2017-03-06 NOTE — Progress Notes (Signed)
[place] for heparin Indication: DVT  Allergies  Allergen Reactions  . Sulfa Antibiotics Rash    Patient Measurements: Height: 5\' 5"  (165.1 cm) Weight: 125 lb 9.6 oz (57 kg) IBW/kg (Calculated) : 57 Heparin Dosing Weight: 57kg  Vital Signs: Temp: 98.9 F (37.2 C) (05/18 1632) Temp Source: Oral (05/18 1632) BP: 98/58 (05/18 1632) Pulse Rate: 50 (05/18 1632)  Labs:  Recent Labs  03/04/17 0612 03/04/17 0830 03/04/17 1348 03/04/17 1744 03/04/17 2031 03/05/17 0304 03/06/17 0402  HGB 10.3*  --   --   --   --  9.2* 8.8*  HCT 32.0*  --   --   --   --  28.1* 28.2*  PLT 372  --   --   --   --  360 396  LABPROT  --   --   --   --   --  15.1  --   INR  --   --   --   --   --  1.18  --   HEPARINUNFRC  --   --   --  0.10*  --  0.12*  --   CREATININE 0.86  --   --   --   --  0.85 0.75  TROPONINI  --  3.61* 2.96*  --  2.80*  --   --     Estimated Creatinine Clearance: 58.9 mL/min (by C-G formula based on SCr of 0.75 mg/dL).  Assessment: 61 yof who was previously on heparin for r/o ACS- now with DVT on doppler and to restart heparin, MD also ordered warfarin.  Heparin level was subtherapeutic when previously on heparin, has been off since yesterday afternoon post-cath. Hgb low, but overall relatively stable, plts normal- no bleeding noted. INR normal.  Goal of Therapy:  Heparin level 0.3-0.7 units/ml Monitor platelets by anticoagulation protocol: Yes   Plan:  Bolus heparin 3000 units IV x1, then start infusion at 850 units/hr Heparin level in 6 hours Daily heparin level and CBC Warfarin per MD (5mg  daily ordered currently) Daily INR  Tegan Britain D. Roslin Norwood, PharmD, BCPS Clinical Pharmacist Pager: [phone] 03/06/2017 5:26 PM

## 2017-03-07 LAB — HEPARIN LEVEL (UNFRACTIONATED)
HEPARIN UNFRACTIONATED: 0.76 [IU]/mL — AB (ref 0.30–0.70)
HEPARIN UNFRACTIONATED: 0.82 [IU]/mL — AB (ref 0.30–0.70)
Heparin Unfractionated: 0.4 IU/mL (ref 0.30–0.70)

## 2017-03-07 LAB — CBC
HCT: 30.5 % — ABNORMAL LOW (ref 36.0–46.0)
HEMOGLOBIN: 9.6 g/dL — AB (ref 12.0–15.0)
MCH: 29.3 pg (ref 26.0–34.0)
MCHC: 31.5 g/dL (ref 30.0–36.0)
MCV: 93 fL (ref 78.0–100.0)
PLATELETS: 435 10*3/uL — AB (ref 150–400)
RBC: 3.28 MIL/uL — ABNORMAL LOW (ref 3.87–5.11)
RDW: 16.4 % — ABNORMAL HIGH (ref 11.5–15.5)
WBC: 8.7 10*3/uL (ref 4.0–10.5)

## 2017-03-07 LAB — PROTIME-INR
INR: 1.34
Prothrombin Time: 16.7 seconds — ABNORMAL HIGH (ref 11.4–15.2)

## 2017-03-07 MED ORDER — FERROUS SULFATE 325 (65 FE) MG PO TABS
325.0000 mg | ORAL_TABLET | Freq: Three times a day (TID) | ORAL | Status: DC
  Administered 2017-03-07 – 2017-03-12 (×16): 325 mg via ORAL
  Filled 2017-03-07 (×16): qty 1

## 2017-03-07 MED ORDER — WARFARIN SODIUM 7.5 MG PO TABS
7.5000 mg | ORAL_TABLET | Freq: Every day | ORAL | Status: DC
  Administered 2017-03-07 – 2017-03-08 (×2): 7.5 mg via ORAL
  Filled 2017-03-07 (×2): qty 1

## 2017-03-07 MED ORDER — POLYETHYLENE GLYCOL 3350 17 G PO PACK
17.0000 g | PACK | Freq: Every day | ORAL | Status: DC
  Filled 2017-03-07: qty 1

## 2017-03-07 NOTE — Progress Notes (Addendum)
[place] for heparin Indication: DVT  Allergies  Allergen Reactions  . Sulfa Antibiotics Rash    Patient Measurements: Height: 5\' 5"  (165.1 cm) Weight: 127 lb (57.6 kg) IBW/kg (Calculated) : 57 Heparin Dosing Weight: 57kg  Vital Signs: Temp: 98.3 F (36.8 C) (05/19 1451) Temp Source: Oral (05/19 1451) BP: 101/56 (05/19 1451) Pulse Rate: 62 (05/19 1451)  Labs:  Recent Labs  03/04/17 2031  03/05/17 0304 03/06/17 0402 03/07/17 0020 03/07/17 0827 03/07/17 1624  HGB  --   < > 9.2* 8.8*  --  9.6*  --   HCT  --   --  28.1* 28.2*  --  30.5*  --   PLT  --   --  360 396  --  435*  --   LABPROT  --   --  15.1  --   --  16.7*  --   INR  --   --  1.18  --   --  1.34  --   HEPARINUNFRC  --   --  0.12*  --  0.40 0.76* 0.82*  CREATININE  --   --  0.85 0.75  --   --   --   TROPONINI 2.80*  --   --   --   --   --   --   < > = values in this interval not displayed.  Estimated Creatinine Clearance: 58.9 mL/min (by C-G formula based on SCr of 0.75 mg/dL).  Assessment: 39 yof on heparin and warfarin for DVT. Heparin remains above goal on 800 units/hr -heparin level = 0.82  Goal of Therapy:   Heparin level 0.3-0.7 units/ml Monitor platelets by anticoagulation protocol: Yes   Plan:  -Decrease heparin to 700 units/hr -Heparin level in 6 hours and daily wth CBC daily  Hildred Laser, Pharm D 03/07/2017 5:08 PM

## 2017-03-07 NOTE — Progress Notes (Signed)
ANTICOAGULATION CONSULT NOTE - Follow Up Consult  Pharmacy Consult for heparin Indication: DVT  Labs:  Recent Labs  03/04/17 0612 03/04/17 0830 03/04/17 1348 03/04/17 1744 03/04/17 2031 03/05/17 0304 03/06/17 0402 03/07/17 0020  HGB 10.3*  --   --   --   --  9.2* 8.8*  --   HCT 32.0*  --   --   --   --  28.1* 28.2*  --   PLT 372  --   --   --   --  360 396  --   LABPROT  --   --   --   --   --  15.1  --   --   INR  --   --   --   --   --  1.18  --   --   HEPARINUNFRC  --   --   --  0.10*  --  0.12*  --  0.40  CREATININE 0.86  --   --   --   --  0.85 0.75  --   TROPONINI  --  3.61* 2.96*  --  2.80*  --   --   --      Assessment/Plan:  71yo female therapeutic on heparin with initial dosing for DVT. Will continue gtt at current rate and confirm stable with additional level.   Wynona Neat, PharmD, BCPS  03/07/2017,2:33 AM

## 2017-03-07 NOTE — Progress Notes (Signed)
Subjective:  Complains of left leg pain. Denies any chest pain or shortness of breath. Tolerating dual antiplatelet and anticoagulation no obvious bleeding.  Objective:  Vital Signs in the last 24 hours: Temp:  [98.4 F (36.9 C)-98.9 F (37.2 C)] 98.6 F (37 C) (05/19 0426) Pulse Rate:  [50-62] 58 (05/19 0426) Resp:  [16-18] 18 (05/19 0426) BP: (90-110)/(52-58) 110/58 (05/19 0426) SpO2:  [97 %-100 %] 99 % (05/19 0426) Weight:  [57.6 kg (127 lb)] 57.6 kg (127 lb) (05/19 0426)  Intake/Output from previous day: 05/18 0701 - 05/19 0700 In: 312.5 [I.V.:312.5] Out: 600 [Urine:600] Intake/Output from this shift: No intake/output data recorded.  Physical Exam: Neck: no adenopathy, no carotid bruit, no JVD and supple, symmetrical, trachea midline Lungs: clear to auscultation bilaterally Heart: regular rate and rhythm, S1, S2 normal and 2/6 systolic murmur noted Abdomen: soft, non-tender; bowel sounds normal; no masses,  no organomegaly Extremities: extremities normal, atraumatic, no cyanosis or edema  Lab Results:  Recent Labs  03/06/17 0402 03/07/17 0827  WBC 8.0 8.7  HGB 8.8* 9.6*  PLT 396 435*    Recent Labs  03/05/17 0304 03/06/17 0402  NA 141 142  K 3.2* 3.8  CL 109 113*  CO2 22 22  GLUCOSE 87 82  BUN 6 6  CREATININE 0.85 0.75    Recent Labs  03/04/17 1348 03/04/17 2031  TROPONINI 2.96* 2.80*   Hepatic Function Panel No results for input(s): PROT, ALBUMIN, AST, ALT, ALKPHOS, BILITOT, BILIDIR, IBILI in the last 72 hours.  Recent Labs  03/05/17 0304  CHOL 174   No results for input(s): PROTIME in the last 72 hours.  Imaging: Imaging results have been reviewed and Dg Tibia/fibula Left  Result Date: 03/06/2017 CLINICAL DATA:  Pain with difficulty weight-bearing EXAM: LEFT TIBIA AND FIBULA - 2 VIEW COMPARISON:  None. FINDINGS: Frontal and lateral views obtained. There is no evident acute fracture or dislocation. There is calcification in the medial  aspect of the ankle joint which in part is vascular in part may represent residua of old trauma and calcified tendinosis. There is no knee or ankle joint effusion. There are multiple foci of popliteal and trifurcation artery atherosclerosis. There is calcification in the distal Achilles tendon. IMPRESSION: No acute fracture or dislocation. 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 Areas of arterial vascular atherosclerosis. No knee or ankle joint effusion. Electronically Signed   By: Lowella Grip III M.D.   On: 03/06/2017 11:21   Dg Foot 2 Views Left  Result Date: 03/06/2017 CLINICAL DATA:  Foot pain common no known injury, initial encounter EXAM: LEFT FOOT - 2 VIEW COMPARISON:  None. FINDINGS: There is relative flattening of the plantar arch. No acute fracture or dislocation is seen. Some soft tissue calcifications are identified. No significant soft tissue swelling is noted. IMPRESSION: Chronic changes without acute abnormality. Electronically Signed   By: Inez Catalina M.D.   On: 03/06/2017 11:17    Cardiac Studies:  Assessment/Plan:  Acute left leg DVT CAD S/P stent in LAD and LCX Recent antero-lateral wall MI Multiple myeloma H/O GI bleed Anemia of blood loss Left ventricular thrombus Plan Continue present management Monitor H&H closely  Start Feosol as per orders   LOS: 3 days    Charolette Forward 03/07/2017, 10:24 AM

## 2017-03-07 NOTE — Progress Notes (Signed)
[place] for heparin Indication: DVT  Allergies  Allergen Reactions  . Sulfa Antibiotics Rash    Patient Measurements: Height: 5\' 5"  (165.1 cm) Weight: 127 lb (57.6 kg) IBW/kg (Calculated) : 57 Heparin Dosing Weight: 57kg  Vital Signs: Temp: 98.6 F (37 C) (05/19 0426) Temp Source: Oral (05/19 0426) BP: 110/58 (05/19 0426) Pulse Rate: 58 (05/19 0426)  Labs:  Recent Labs  03/04/17 1348  03/04/17 2031  03/05/17 0304 03/06/17 0402 03/07/17 0020 03/07/17 0827  HGB  --   --   --   < > 9.2* 8.8*  --  9.6*  HCT  --   --   --   --  28.1* 28.2*  --  30.5*  PLT  --   --   --   --  360 396  --  435*  LABPROT  --   --   --   --  15.1  --   --  16.7*  INR  --   --   --   --  1.18  --   --  1.34  HEPARINUNFRC  --   < >  --   --  0.12*  --  0.40 0.76*  CREATININE  --   --   --   --  0.85 0.75  --   --   TROPONINI 2.96*  --  2.80*  --   --   --   --   --   < > = values in this interval not displayed.  Estimated Creatinine Clearance: 58.9 mL/min (by C-G formula based on SCr of 0.75 mg/dL).  Assessment: 67 yof on heparin and warfarin for DVT.   Heparin level now supratherapeutic at 0.76 on 850 units/hr. Spoke with nurse and no issues with bleeding observed. CBC is stable.   INR 1.18>>1.34 after 1 dose of 5 mg.  Goal of Therapy:   Heparin level 0.3-0.7 units/ml Monitor platelets by anticoagulation protocol: Yes   Plan:  Decrease heparin to 800 units/hr Heparin level in 6 hours Daily heparin level and CBC Warfarin per MD (7.5 mg daily ordered currently) Daily INR  Uvaldo Bristle, PharmD PGY1 Pharmacy Resident Pager: [phone] 03/07/2017 10:04 AM

## 2017-03-07 NOTE — Progress Notes (Signed)
Pt vital signs soft. Pt assessed no apparent distress. Pt is asymptomatic. HR low 40s upper 50s. BP 90s/50s. Paged cardiology to make medical team aware. On call MD [place] paged. No further orders at this time. Will Continue to monitor patient.

## 2017-03-08 LAB — CBC
HEMATOCRIT: 28.5 % — AB (ref 36.0–46.0)
Hemoglobin: 8.7 g/dL — ABNORMAL LOW (ref 12.0–15.0)
MCH: 28.5 pg (ref 26.0–34.0)
MCHC: 30.5 g/dL (ref 30.0–36.0)
MCV: 93.4 fL (ref 78.0–100.0)
PLATELETS: 412 10*3/uL — AB (ref 150–400)
RBC: 3.05 MIL/uL — AB (ref 3.87–5.11)
RDW: 16.6 % — AB (ref 11.5–15.5)
WBC: 6.8 10*3/uL (ref 4.0–10.5)

## 2017-03-08 LAB — PROTIME-INR
INR: 1.36
PROTHROMBIN TIME: 16.9 s — AB (ref 11.4–15.2)

## 2017-03-08 LAB — HEPARIN LEVEL (UNFRACTIONATED)
HEPARIN UNFRACTIONATED: 0.56 [IU]/mL (ref 0.30–0.70)
Heparin Unfractionated: 0.52 IU/mL (ref 0.30–0.70)

## 2017-03-08 NOTE — Progress Notes (Signed)
ANTICOAGULATION CONSULT NOTE - Follow Up Consult  Pharmacy Consult for heparin Indication: DVT  Labs:  Recent Labs  03/05/17 0304 03/06/17 0402  03/07/17 0827 03/07/17 1624 03/08/17 0006  HGB 9.2* 8.8*  --  9.6*  --  8.7*  HCT 28.1* 28.2*  --  30.5*  --  28.5*  PLT 360 396  --  435*  --  412*  LABPROT 15.1  --   --  16.7*  --   --   INR 1.18  --   --  1.34  --   --   HEPARINUNFRC 0.12*  --   < > 0.76* 0.82* 0.52  CREATININE 0.85 0.75  --   --   --   --   < > = values in this interval not displayed.   Assessment/Plan:  70yo female therapeutic on heparin after rate changes. Will continue gtt at current rate and confirm stable with additional level.   Wynona Neat, PharmD, BCPS  03/08/2017,12:58 AM

## 2017-03-08 NOTE — Progress Notes (Signed)
[place] for heparin Indication: DVT  Allergies  Allergen Reactions  . Sulfa Antibiotics Rash    Patient Measurements: Height: 5\' 5"  (165.1 cm) Weight: 133 lb 8 oz (60.6 kg) IBW/kg (Calculated) : 57 Heparin Dosing Weight: 57kg  Vital Signs: Temp: 98.5 F (36.9 C) (05/20 0604) Temp Source: Oral (05/20 0604) BP: 97/46 (05/20 0604) Pulse Rate: 57 (05/20 0604)  Labs:  Recent Labs  03/06/17 0402  03/07/17 0827 03/07/17 1624 03/08/17 0006 03/08/17 0746  HGB 8.8*  --  9.6*  --  8.7*  --   HCT 28.2*  --  30.5*  --  28.5*  --   PLT 396  --  435*  --  412*  --   LABPROT  --   --  16.7*  --  16.9*  --   INR  --   --  1.34  --  1.36  --   HEPARINUNFRC  --   < > 0.76* 0.82* 0.52 0.56  CREATININE 0.75  --   --   --   --   --   < > = values in this interval not displayed.  Estimated Creatinine Clearance: 58.9 mL/min (by C-G formula based on SCr of 0.75 mg/dL).  Assessment: 22 yof on heparin and warfarin for DVT (also w/ small LV apical thrombus on echo).  5/10. Heparin is at goal on 700 units/hr. He is also noted on brilinta and aspirin (recent MI w/ stent to LAD on in 12/2016 then with stent thrombosis and stented again 02/24/2017) -heparin level = 0.56, INR= 1.36 (warfarin dosing per MD)  Goal of Therapy:   Heparin level 0.3-0.7 units/ml Monitor platelets by anticoagulation protocol: Yes   Plan:  -No heparin changes needed -Daily heparin level and CBC  Hildred Laser, Pharm D 03/08/2017 11:50 AM

## 2017-03-08 NOTE — Progress Notes (Signed)
Subjective:  Denies any chest pain or shortness of breath states left leg pain has improved Objective:  Vital Signs in the last 24 hours: Temp:  [98.3 F (36.8 C)-98.5 F (36.9 C)] 98.5 F (36.9 C) (05/20 0604) Pulse Rate:  [57-62] 57 (05/20 0604) Resp:  [18] 18 (05/20 0604) BP: (97-102)/(46-58) 97/46 (05/20 0604) SpO2:  [99 %-100 %] 100 % (05/20 0604) Weight:  [60.6 kg (133 lb 8 oz)] 60.6 kg (133 lb 8 oz) (05/20 0604)  Intake/Output from previous day: 05/19 0701 - 05/20 0700 In: 582 [P.O.:360; I.V.:222] Out: 400 [Urine:200; Stool:200] Intake/Output from this shift: No intake/output data recorded.  Physical Exam: Neck: no adenopathy, no carotid bruit, no JVD and supple, symmetrical, trachea midline Lungs: clear to auscultation bilaterally Heart: regular rate and rhythm, S1, S2 normal and 2/6 systolic murmur noted Abdomen: soft, non-tender; bowel sounds normal; no masses,  no organomegaly Extremities: extremities normal, atraumatic, no cyanosis or edema  Lab Results:  Recent Labs  03/07/17 0827 03/08/17 0006  WBC 8.7 6.8  HGB 9.6* 8.7*  PLT 435* 412*    Recent Labs  03/06/17 0402  NA 142  K 3.8  CL 113*  CO2 22  GLUCOSE 82  BUN 6  CREATININE 0.75   No results for input(s): TROPONINI in the last 72 hours.  Invalid input(s): CK, MB Hepatic Function Panel No results for input(s): PROT, ALBUMIN, AST, ALT, ALKPHOS, BILITOT, BILIDIR, IBILI in the last 72 hours. No results for input(s): CHOL in the last 72 hours. No results for input(s): PROTIME in the last 72 hours.  Imaging: Imaging results have been reviewed and No results found.  Cardiac Studies:  Assessment/Plan:  Acute left leg DVT CAD S/P stent in LAD and LCX Recent antero-lateral wall MI Multiple myeloma H/O GI bleed Anemia of blood loss Left ventricular apical thrombus Plan Continue present management Check labs in a.m.   LOS: 4 days    Charolette Forward 03/08/2017, 11:22 AM

## 2017-03-09 LAB — CBC
HEMATOCRIT: 27.1 % — AB (ref 36.0–46.0)
Hemoglobin: 8.4 g/dL — ABNORMAL LOW (ref 12.0–15.0)
MCH: 28.9 pg (ref 26.0–34.0)
MCHC: 31 g/dL (ref 30.0–36.0)
MCV: 93.1 fL (ref 78.0–100.0)
Platelets: 432 10*3/uL — ABNORMAL HIGH (ref 150–400)
RBC: 2.91 MIL/uL — AB (ref 3.87–5.11)
RDW: 16.4 % — AB (ref 11.5–15.5)
WBC: 5.7 10*3/uL (ref 4.0–10.5)

## 2017-03-09 LAB — HEPARIN LEVEL (UNFRACTIONATED): Heparin Unfractionated: 0.58 IU/mL (ref 0.30–0.70)

## 2017-03-09 LAB — PROTIME-INR
INR: 2.43
Prothrombin Time: 26.8 seconds — ABNORMAL HIGH (ref 11.4–15.2)

## 2017-03-09 MED ORDER — WARFARIN SODIUM 2.5 MG PO TABS
2.5000 mg | ORAL_TABLET | Freq: Every day | ORAL | Status: DC
  Administered 2017-03-09: 2.5 mg via ORAL
  Filled 2017-03-09: qty 1

## 2017-03-09 NOTE — Progress Notes (Addendum)
[place] for heparin Indication: DVT  Allergies  Allergen Reactions  . Sulfa Antibiotics Rash    Patient Measurements: Height: 5\' 5"  (165.1 cm) Weight: 141 lb (64 kg) IBW/kg (Calculated) : 57 Heparin Dosing Weight: 57kg  Vital Signs: Temp: 98.6 F (37 C) (05/21 0523) Temp Source: Oral (05/21 0523) BP: 120/52 (05/21 0523) Pulse Rate: 50 (05/21 0523)  Labs:  Recent Labs  03/07/17 0827 03/07/17 1624 03/08/17 0006 03/08/17 0746 03/09/17 0333  HGB 9.6*  --  8.7*  --  8.4*  HCT 30.5*  --  28.5*  --  27.1*  PLT 435*  --  412*  --  432*  LABPROT 16.7*  --  16.9*  --   --   INR 1.34  --  1.36  --   --   HEPARINUNFRC 0.76* 0.82* 0.52 0.56  --     Estimated Creatinine Clearance: 58.9 mL/min (by C-G formula based on SCr of 0.75 mg/dL).  Assessment: 31 yof on heparin and warfarin (per MD dosing) for DVT (also w/ small LV apical thrombus on echo 5/10). Heparin remains at goal on 700 units/hr. He is also noted on brilinta and aspirin (recent MI w/ stent to LAD on in 12/2016, then with stent thrombosis and stented again 02/24/2017).  Heparin level remains therapeutic at 0.58, INR with large jump 1.36>2.43 (warfarin dosing per MD, goal 2.5-3 per MD note 5/18). Hg slow trend down 8.4, plt stable. No bleed documented.   Today is day #4/5 of heparin/warfarin overlap. Noted on amiodarone.  Goal of Therapy:   Heparin level 0.3-0.7 units/ml  INR goal 2.5-3.5 per MD dosing (may need to decrease while on triple therapy?) Monitor platelets by anticoagulation protocol: Yes   Plan:  -Continue heparin at 700 units/h - d/c when INR therapeutic > 24hr -Warfarin per MD (7.5 mg/d; increased 5/19) - will page MD to decrease dose with large INR jump -Daily heparin level/INR/CBC -Monitor for s/sx bleeding  Elicia Lamp, PharmD, BCPS Clinical Pharmacist 03/09/2017 9:51 AM    ADDENDUM:  Per discussion with Dr. Doylene Canard, will decrease warfarin dose to 2.5mg   daily and follow INR trend.  Elicia Lamp, PharmD, BCPS Clinical Pharmacist 03/09/2017 11:44 AM

## 2017-03-09 NOTE — Care Management Important Message (Signed)
Important Message  Patient Details  Name: Abigail [name] MRN: [id number] Date of Birth: 31-Mar-1946   Medicare Important Message Given:  Yes    [name] May 03/09/2017, 12:59 PM

## 2017-03-09 NOTE — Progress Notes (Signed)
Ref: Nolene Ebbs, MD   Subjective:  Left ankle pain. INR pending today.   Objective:  Vital Signs in the last 24 hours: Temp:  [97.4 F (36.3 C)-99 F (37.2 C)] 98.6 F (37 C) (05/21 0523) Pulse Rate:  [50-61] 50 (05/21 0523) Cardiac Rhythm: Sinus bradycardia (05/21 0747) Resp:  [18] 18 (05/21 0523) BP: (116-120)/(52-61) 120/52 (05/21 0523) SpO2:  [95 %-100 %] 100 % (05/21 0523) Weight:  [64 kg (141 lb)] 64 kg (141 lb) (05/21 0523)  Physical Exam: BP Readings from Last 1 Encounters:  03/09/17 (!) 120/52    Wt Readings from Last 1 Encounters:  03/09/17 64 kg (141 lb)    Weight change: 3.402 kg (7 lb 8 oz) Body mass index is 23.46 kg/m. HEENT: [place]/AT, Eyes-Brown, PERL, EOMI, Conjunctiva-Pale, Sclera-Non-icteric Neck: No JVD, No bruit, Trachea midline. Lungs:  Clear, Bilateral. Cardiac:  Regular rhythm, normal S1 and S2, no S3. II/VI systolic murmur. Abdomen:  Soft, non-tender. BS present. Extremities:  No edema present. No cyanosis. No clubbing. Left ankle tenderness. CNS: AxOx3, Cranial nerves grossly intact, moves all 4 extremities.  Skin: Warm and dry.   Intake/Output from previous day: 05/20 0701 - 05/21 0700 In: 378.7 [P.O.:170; I.V.:208.7] Out: 600 [Urine:600]    Lab Results: BMET    Component Value Date/Time   NA 142 03/06/2017 0402   NA 141 03/05/2017 0304   NA 141 03/04/2017 0612   NA 143 02/12/2017 1035   NA 145 01/29/2017 1428   NA 140 12/18/2016 0932   K 3.8 03/06/2017 0402   K 3.2 (L) 03/05/2017 0304   K 3.2 (L) 03/04/2017 0612   K 3.8 02/12/2017 1035   K 3.7 01/29/2017 1428   K 4.9 12/18/2016 0932   CL 113 (H) 03/06/2017 0402   CL 109 03/05/2017 0304   CL 107 03/04/2017 0612   CO2 22 03/06/2017 0402   CO2 22 03/05/2017 0304   CO2 23 03/04/2017 0612   CO2 23 02/12/2017 1035   CO2 25 01/29/2017 1428   CO2 24 12/18/2016 0932   GLUCOSE 82 03/06/2017 0402   GLUCOSE 87 03/05/2017 0304   GLUCOSE 100 (H) 03/04/2017 0612   GLUCOSE 106  02/12/2017 1035   GLUCOSE 127 01/29/2017 1428   GLUCOSE 138 12/18/2016 0932   BUN 6 03/06/2017 0402   BUN 6 03/05/2017 0304   BUN 7 03/04/2017 0612   BUN 20.6 02/12/2017 1035   BUN 19.1 01/29/2017 1428   BUN 16.8 12/18/2016 0932   CREATININE 0.75 03/06/2017 0402   CREATININE 0.85 03/05/2017 0304   CREATININE 0.86 03/04/2017 0612   CREATININE 1.0 02/12/2017 1035   CREATININE 0.9 01/29/2017 1428   CREATININE 1.7 (H) 12/18/2016 0932   CALCIUM 7.8 (L) 03/06/2017 0402   CALCIUM 8.2 (L) 03/05/2017 0304   CALCIUM 8.8 (L) 03/04/2017 0612   CALCIUM 9.3 02/12/2017 1035   CALCIUM 9.2 01/29/2017 1428   CALCIUM 9.9 12/18/2016 0932   GFRNONAA >60 03/06/2017 0402   GFRNONAA >60 03/05/2017 0304   GFRNONAA >60 03/04/2017 0612   GFRAA >60 03/06/2017 0402   GFRAA >60 03/05/2017 0304   GFRAA >60 03/04/2017 0612   CBC    Component Value Date/Time   WBC 5.7 03/09/2017 0333   RBC 2.91 (L) 03/09/2017 0333   HGB 8.4 (L) 03/09/2017 0333   HGB 10.1 (L) 02/12/2017 1035   HCT 27.1 (L) 03/09/2017 0333   HCT 31.9 (L) 02/12/2017 1035   PLT 432 (H) 03/09/2017 0333   PLT 258  02/12/2017 1035   MCV 93.1 03/09/2017 0333   MCV 95.8 02/12/2017 1035   MCH 28.9 03/09/2017 0333   MCHC 31.0 03/09/2017 0333   RDW 16.4 (H) 03/09/2017 0333   RDW 15.3 (H) 02/12/2017 1035   LYMPHSABS 0.8 03/04/2017 0612   LYMPHSABS 1.0 02/12/2017 1035   MONOABS 1.5 (H) 03/04/2017 0612   MONOABS 0.9 02/12/2017 1035   EOSABS 0.1 03/04/2017 0612   EOSABS 0.1 02/12/2017 1035   BASOSABS 0.0 03/04/2017 0612   BASOSABS 0.0 02/12/2017 1035   HEPATIC Function Panel  Recent Labs  02/25/17 0300 02/27/17 0230 02/28/17 0550  PROT 4.6* 4.8* 4.3*   HEMOGLOBIN A1C No components found for: HGA1C,  MPG CARDIAC ENZYMES Lab Results  Component Value Date   CKTOTAL 129 01/01/2012   CKMB 4.6 (H) 01/01/2012   TROPONINI 2.80 (HH) 03/04/2017   TROPONINI 2.96 (HH) 03/04/2017   TROPONINI 3.61 (HH) 03/04/2017   BNP No results for  input(s): PROBNP in the last 8760 hours. TSH  Recent Labs  12/06/16 2005 12/18/16 1056 12/22/16 2008  TSH 0.541 0.576 0.481   CHOLESTEROL  Recent Labs  12/07/16 0605 02/24/17 2028 03/05/17 0304  CHOL 220* 196 174    Scheduled Meds: . acyclovir  400 mg Oral BID  . amiodarone  200 mg Oral Daily  . aspirin EC  81 mg Oral Daily  . atorvastatin  80 mg Oral q1800  . calcium carbonate  1 tablet Oral Q breakfast  . DULoxetine  20 mg Oral Daily  . feeding supplement (ENSURE ENLIVE)  237 mL Oral TID BM  . ferrous sulfate  325 mg Oral TID WC  . mirtazapine  15 mg Oral QHS  . pantoprazole  40 mg Oral BID AC  . polyethylene glycol  17 g Oral Daily  . potassium chloride  10 mEq Oral TID  . pyridostigmine  30 mg Oral BID  . sodium chloride flush  3 mL Intravenous Q12H  . ticagrelor  90 mg Oral BID  . vitamin C  1,000 mg Oral Daily  . warfarin  7.5 mg Oral q1800  . Warfarin - Physician Dosing Inpatient   Does not apply q1800   Continuous Infusions: . sodium chloride 250 mL (03/06/17 1355)  . heparin 700 Units/hr (03/07/17 2120)  . nitroGLYCERIN 5 mcg/min (03/05/17 0910)   PRN Meds:.sodium chloride, acetaminophen, nitroGLYCERIN, ondansetron (ZOFRAN) IV, oxyCODONE, polyethylene glycol, sodium chloride flush  Assessment/Plan: Acute left leg DVT CAD S/P stent in LAD and LCX Recent antero-lateral wall MI Multiple myeloma H/O GI bleed Anemia of blood loss Left ventricular thrombus  Check Uric acid level. Continue analgesic   LOS: 5 days    [name]  MD  03/09/2017, 9:02 AM

## 2017-03-10 LAB — COMPREHENSIVE METABOLIC PANEL
ALK PHOS: 100 U/L (ref 38–126)
ALT: 24 U/L (ref 14–54)
AST: 28 U/L (ref 15–41)
Albumin: 2.1 g/dL — ABNORMAL LOW (ref 3.5–5.0)
Anion gap: 6 (ref 5–15)
BUN: 5 mg/dL — ABNORMAL LOW (ref 6–20)
CALCIUM: 8.5 mg/dL — AB (ref 8.9–10.3)
CO2: 20 mmol/L — AB (ref 22–32)
Chloride: 115 mmol/L — ABNORMAL HIGH (ref 101–111)
Creatinine, Ser: 0.81 mg/dL (ref 0.44–1.00)
Glucose, Bld: 84 mg/dL (ref 65–99)
Potassium: 4.4 mmol/L (ref 3.5–5.1)
Sodium: 141 mmol/L (ref 135–145)
Total Bilirubin: 0.6 mg/dL (ref 0.3–1.2)
Total Protein: 4.7 g/dL — ABNORMAL LOW (ref 6.5–8.1)

## 2017-03-10 LAB — CBC
HCT: 27.7 % — ABNORMAL LOW (ref 36.0–46.0)
Hemoglobin: 8.5 g/dL — ABNORMAL LOW (ref 12.0–15.0)
MCH: 28.9 pg (ref 26.0–34.0)
MCHC: 30.7 g/dL (ref 30.0–36.0)
MCV: 94.2 fL (ref 78.0–100.0)
PLATELETS: 455 10*3/uL — AB (ref 150–400)
RBC: 2.94 MIL/uL — ABNORMAL LOW (ref 3.87–5.11)
RDW: 16.6 % — AB (ref 11.5–15.5)
WBC: 5.8 10*3/uL (ref 4.0–10.5)

## 2017-03-10 LAB — URIC ACID: Uric Acid, Serum: 3.1 mg/dL (ref 2.3–6.6)

## 2017-03-10 LAB — HEPARIN LEVEL (UNFRACTIONATED): Heparin Unfractionated: 0.53 IU/mL (ref 0.30–0.70)

## 2017-03-10 LAB — PROTIME-INR
INR: 2.91
Prothrombin Time: 31.1 seconds — ABNORMAL HIGH (ref 11.4–15.2)

## 2017-03-10 MED ORDER — WARFARIN SODIUM 2 MG PO TABS
2.0000 mg | ORAL_TABLET | Freq: Every day | ORAL | Status: DC
  Administered 2017-03-10: 2 mg via ORAL
  Filled 2017-03-10: qty 1

## 2017-03-10 MED ORDER — POTASSIUM CHLORIDE CRYS ER 10 MEQ PO TBCR
10.0000 meq | EXTENDED_RELEASE_TABLET | Freq: Two times a day (BID) | ORAL | Status: DC
  Administered 2017-03-10 – 2017-03-12 (×5): 10 meq via ORAL
  Filled 2017-03-10 (×5): qty 1

## 2017-03-10 MED ORDER — AMIODARONE HCL 200 MG PO TABS
100.0000 mg | ORAL_TABLET | Freq: Every day | ORAL | Status: DC
  Administered 2017-03-10 – 2017-03-12 (×3): 100 mg via ORAL
  Filled 2017-03-10 (×3): qty 1

## 2017-03-10 MED ORDER — PANTOPRAZOLE SODIUM 40 MG PO TBEC
40.0000 mg | DELAYED_RELEASE_TABLET | Freq: Every day | ORAL | Status: DC
Start: 2017-03-11 — End: 2017-03-12
  Administered 2017-03-11 – 2017-03-12 (×2): 40 mg via ORAL
  Filled 2017-03-10 (×2): qty 1

## 2017-03-10 NOTE — Progress Notes (Signed)
Pt was ambulated across the hall.. C/o left leg pain during and after ambulation. Denies SOB or chest pain/discomfort. Pain medication given. Will continue to monitor

## 2017-03-10 NOTE — Progress Notes (Signed)
Ref: Nolene Ebbs, MD   Subjective:  Decreasing left ankle pain. INR therapeutic at 2.91. Uric acid level is normal.  Objective:  Vital Signs in the last 24 hours: Temp:  [98.1 F (36.7 C)-98.4 F (36.9 C)] 98.3 F (36.8 C) (05/22 0535) Pulse Rate:  [55-59] 59 (05/22 0535) Cardiac Rhythm: Sinus bradycardia (05/22 0700) Resp:  [17-18] 17 (05/22 0535) BP: (114-125)/(54-65) 114/54 (05/22 0535) SpO2:  [98 %-100 %] 100 % (05/22 0535) Weight:  [63.4 kg (139 lb 12.8 oz)] 63.4 kg (139 lb 12.8 oz) (05/22 0535)  Physical Exam: BP Readings from Last 1 Encounters:  03/10/17 (!) 114/54    Wt Readings from Last 1 Encounters:  03/10/17 63.4 kg (139 lb 12.8 oz)    Weight change: -0.544 kg (-1 lb 3.2 oz) Body mass index is 23.26 kg/m. HEENT: [place]/AT, Eyes-Brown, PERL, EOMI, Conjunctiva-Pale, Sclera-Non-icteric Neck: No JVD, No bruit, Trachea midline. Lungs:  Clear, Bilateral. Cardiac:  Regular rhythm, normal S1 and S2, no S3. II/VI systolic murmur. Abdomen:  Soft, non-tender. BS present. Extremities:  No edema present. No cyanosis. No clubbing. Left ankle decresing tenderness. CNS: AxOx3, Cranial nerves grossly intact, moves all 4 extremities.  Skin: Warm and dry.   Intake/Output from previous day: 05/21 0701 - 05/22 0700 In: 480 [P.O.:480] Out: 701 [Urine:700; Stool:1]    Lab Results: BMET    Component Value Date/Time   NA 141 03/10/2017 0302   NA 142 03/06/2017 0402   NA 141 03/05/2017 0304   NA 143 02/12/2017 1035   NA 145 01/29/2017 1428   NA 140 12/18/2016 0932   K 4.4 03/10/2017 0302   K 3.8 03/06/2017 0402   K 3.2 (L) 03/05/2017 0304   K 3.8 02/12/2017 1035   K 3.7 01/29/2017 1428   K 4.9 12/18/2016 0932   CL 115 (H) 03/10/2017 0302   CL 113 (H) 03/06/2017 0402   CL 109 03/05/2017 0304   CO2 20 (L) 03/10/2017 0302   CO2 22 03/06/2017 0402   CO2 22 03/05/2017 0304   CO2 23 02/12/2017 1035   CO2 25 01/29/2017 1428   CO2 24 12/18/2016 0932   GLUCOSE 84  03/10/2017 0302   GLUCOSE 82 03/06/2017 0402   GLUCOSE 87 03/05/2017 0304   GLUCOSE 106 02/12/2017 1035   GLUCOSE 127 01/29/2017 1428   GLUCOSE 138 12/18/2016 0932   BUN 5 (L) 03/10/2017 0302   BUN 6 03/06/2017 0402   BUN 6 03/05/2017 0304   BUN 20.6 02/12/2017 1035   BUN 19.1 01/29/2017 1428   BUN 16.8 12/18/2016 0932   CREATININE 0.81 03/10/2017 0302   CREATININE 0.75 03/06/2017 0402   CREATININE 0.85 03/05/2017 0304   CREATININE 1.0 02/12/2017 1035   CREATININE 0.9 01/29/2017 1428   CREATININE 1.7 (H) 12/18/2016 0932   CALCIUM 8.5 (L) 03/10/2017 0302   CALCIUM 7.8 (L) 03/06/2017 0402   CALCIUM 8.2 (L) 03/05/2017 0304   CALCIUM 9.3 02/12/2017 1035   CALCIUM 9.2 01/29/2017 1428   CALCIUM 9.9 12/18/2016 0932   GFRNONAA >60 03/10/2017 0302   GFRNONAA >60 03/06/2017 0402   GFRNONAA >60 03/05/2017 0304   GFRAA >60 03/10/2017 0302   GFRAA >60 03/06/2017 0402   GFRAA >60 03/05/2017 0304   CBC    Component Value Date/Time   WBC 5.8 03/10/2017 0302   RBC 2.94 (L) 03/10/2017 0302   HGB 8.5 (L) 03/10/2017 0302   HGB 10.1 (L) 02/12/2017 1035   HCT 27.7 (L) 03/10/2017 0302   HCT 31.9 (  L) 02/12/2017 1035   PLT 455 (H) 03/10/2017 0302   PLT 258 02/12/2017 1035   MCV 94.2 03/10/2017 0302   MCV 95.8 02/12/2017 1035   MCH 28.9 03/10/2017 0302   MCHC 30.7 03/10/2017 0302   RDW 16.6 (H) 03/10/2017 0302   RDW 15.3 (H) 02/12/2017 1035   LYMPHSABS 0.8 03/04/2017 0612   LYMPHSABS 1.0 02/12/2017 1035   MONOABS 1.5 (H) 03/04/2017 0612   MONOABS 0.9 02/12/2017 1035   EOSABS 0.1 03/04/2017 0612   EOSABS 0.1 02/12/2017 1035   BASOSABS 0.0 03/04/2017 0612   BASOSABS 0.0 02/12/2017 1035   HEPATIC Function Panel  Recent Labs  02/27/17 0230 02/28/17 0550 03/10/17 0302  PROT 4.8* 4.3* 4.7*   HEMOGLOBIN A1C No components found for: HGA1C,  MPG CARDIAC ENZYMES Lab Results  Component Value Date   CKTOTAL 129 01/01/2012   CKMB 4.6 (H) 01/01/2012   TROPONINI 2.80 (HH) 03/04/2017    TROPONINI 2.96 (HH) 03/04/2017   TROPONINI 3.61 (HH) 03/04/2017   BNP No results for input(s): PROBNP in the last 8760 hours. TSH  Recent Labs  12/06/16 2005 12/18/16 1056 12/22/16 2008  TSH 0.541 0.576 0.481   CHOLESTEROL  Recent Labs  12/07/16 0605 02/24/17 2028 03/05/17 0304  CHOL 220* 196 174    Scheduled Meds: . acyclovir  400 mg Oral BID  . amiodarone  100 mg Oral Daily  . aspirin EC  81 mg Oral Daily  . atorvastatin  80 mg Oral q1800  . calcium carbonate  1 tablet Oral Q breakfast  . DULoxetine  20 mg Oral Daily  . feeding supplement (ENSURE ENLIVE)  237 mL Oral TID BM  . ferrous sulfate  325 mg Oral TID WC  . mirtazapine  15 mg Oral QHS  . [START ON 03/11/2017] pantoprazole  40 mg Oral Daily  . potassium chloride  10 mEq Oral BID  . pyridostigmine  30 mg Oral BID  . ticagrelor  90 mg Oral BID  . vitamin C  1,000 mg Oral Daily  . warfarin  2 mg Oral q1800  . Warfarin - Physician Dosing Inpatient   Does not apply q1800   Continuous Infusions: . sodium chloride 250 mL (03/06/17 1355)   PRN Meds:.sodium chloride, acetaminophen, nitroGLYCERIN, ondansetron (ZOFRAN) IV, oxyCODONE, polyethylene glycol, sodium chloride flush  Assessment/Plan: Acute left leg DVT CAD S/P stent in LAD and LCX Recent antero-lateral wall MI Multiple myeloma H/O GI bleed Anemia of blood loss Left ventricular thrombus  Discontinue IV heparin. Decrease warfarin to 2 mg. q PM. Increase activity.   LOS: 6 days    [name]  MD  03/10/2017, 8:53 AM

## 2017-03-11 ENCOUNTER — Telehealth: Payer: Self-pay | Admitting: Oncology

## 2017-03-11 LAB — PROTIME-INR
INR: 2.67
PROTHROMBIN TIME: 29 s — AB (ref 11.4–15.2)

## 2017-03-11 MED ORDER — WARFARIN SODIUM 2.5 MG PO TABS
2.5000 mg | ORAL_TABLET | Freq: Every day | ORAL | Status: DC
Start: 2017-03-11 — End: 2017-03-12
  Administered 2017-03-11: 2.5 mg via ORAL
  Filled 2017-03-11: qty 1

## 2017-03-11 MED ORDER — DEXTROSE 5 % IV SOLN
1.0000 g | Freq: Once | INTRAVENOUS | Status: AC
  Administered 2017-03-11: 1 g via INTRAVENOUS
  Filled 2017-03-11: qty 10

## 2017-03-11 NOTE — Progress Notes (Signed)
IV removed from patient's left [facility] per patient's request as she stated it was painful. Site around IV is puffy and red, and there is a hole in patient's [facility] that is larger than size of IV catheter that had been in place.  Dr Doylene Canard notified of this and provided Rn with verbal orders for rocephin 1 gram one time dose and he would recheck site tomorrow. Patient complains of no pain now that IV has been removed.

## 2017-03-11 NOTE — Progress Notes (Addendum)
Nutrition Follow Up  DOCUMENTATION CODES:   Severe malnutrition in context of chronic illness  INTERVENTION:    Continue Ensure Enlive po TID, each supplement provides 350 kcal and 20 grams of protein  NUTRITION DIAGNOSIS:   Malnutrition (chronic) related to chronic illness (multiple myeloma) as evidenced by mild depletion of body fat, severe depletion of muscle mass, energy intake < or equal to 75% for > or equal to 1 month, percent weight loss (20% x 3.5 months), ongoing  GOAL:   Patient will meet greater than or equal to 90% of their needs, progressing  MONITOR:   PO intake, Supplement acceptance, Labs, Weight trends, I & O's  ASSESSMENT:   71 yo Female with recent antero-lateral wall MI and Stent in LAD and LCx has chest pain, mid chest and non-radiating, early this AM, responding to SL NTG. She has some nausea but no shortness of breath.   S/p cardiac cath 5/17.  Continues on a Heart Healthy diet. PO intake improved at 50-75% per flowsheet records. Drinking her Ensure Enlive nutrition supplements TID. Medications include Coumadin, Vit C, Remeron and ferrous sulfate. Labs reviewed.  Prothrombin Time 29.0 (H).  Calcium 8.5 (L).  Diet Order:  Diet Heart Room service appropriate? Yes; Fluid consistency: Thin  Skin:  Reviewed, no issues  Last BM:  5/22  Height:   Ht Readings from Last 1 Encounters:  03/04/17 '5\' 5"'  (1.651 m)   Weight:   Wt Readings from Last 1 Encounters:  03/11/17 138 lb (62.6 kg)   Wt Readings from Last 15 Encounters:  03/11/17 138 lb (62.6 kg)  02/25/17 134 lb (60.8 kg)  02/12/17 131 lb 12.8 oz (59.8 kg)  01/29/17 132 lb 4.8 oz (60 kg)  01/25/17 131 lb (59.4 kg)  01/08/17 132 lb 14.4 oz (60.3 kg)  01/02/17 138 lb 0.1 oz (62.6 kg)  12/20/16 140 lb (63.5 kg)  12/18/16 140 lb 8 oz (63.7 kg)  12/11/16 146 lb 6.4 oz (66.4 kg)  12/09/16 139 lb 9.5 oz (63.3 kg)  12/04/16 146 lb 6 oz (66.4 kg)  12/03/16 146 lb 14.4 oz (66.6 kg)  11/25/16 156  lb (70.8 kg)  11/18/16 159 lb (72.1 kg)   Ideal Body Weight:  56.8 kg  BMI:  Body mass index is 22.96 kg/m.  Estimated Nutritional Needs:   Kcal:  1700-1900  Protein:  85-100 gm  Fluid:  1.7-1.9 L  EDUCATION NEEDS:   No education needs identified at this time  Arthur Holms, RD, LDN Pager #: [phone] After-Hours Pager #: [phone]

## 2017-03-11 NOTE — Telephone Encounter (Signed)
Per 5/22 schedule message cancelled 5/24 f/u and moved to 5/31. Primary number not in service. Called secondary number and per son in law patient still in hospital and patient's sister is not available. Gave son in law new appointment for 5/31 @ 2:45 pm and mailed schedule.

## 2017-03-12 ENCOUNTER — Ambulatory Visit: Payer: Medicare HMO | Admitting: Nurse Practitioner

## 2017-03-12 LAB — PROTIME-INR
INR: 2.49
PROTHROMBIN TIME: 27.4 s — AB (ref 11.4–15.2)

## 2017-03-12 MED ORDER — WARFARIN SODIUM 5 MG PO TABS: 5.0000 mg | ORAL_TABLET | Freq: Every day | ORAL | Status: DC

## 2017-03-12 MED ORDER — WARFARIN SODIUM 5 MG PO TABS: 5.0000 mg | ORAL_TABLET | Freq: Every day | ORAL | 3 refills | Status: DC

## 2017-03-12 MED ORDER — AMIODARONE HCL 200 MG PO TABS: 100.0000 mg | ORAL_TABLET | Freq: Every day | ORAL | Status: DC

## 2017-03-12 MED ORDER — PANTOPRAZOLE SODIUM 40 MG PO TBEC: 40.0000 mg | DELAYED_RELEASE_TABLET | Freq: Every day | ORAL | 2 refills | Status: DC

## 2017-03-12 NOTE — Care Management Note (Signed)
Case Management Note Marvetta Gibbons RN, BSN Unit 2W-Case Manager [phone]  Patient Details  Name: NIANNA IGO MRN: [id number] Date of Birth: 05/07/1946  Subjective/Objective:   Pt admitted with DVT, recent MI                 Action/Plan: PTA pt lived at home- was active with [facility] for HHRN/PT/OT/aide- will need resumption orders for discharge  Expected Discharge Date:  03/12/17               Expected Discharge Plan:  [place]  In-House Referral:     Discharge planning Services  CM Consult  Post Acute Care Choice:  [place], Resumption of Svcs/PTA Provider Choice offered to:  Patient  DME Arranged:    DME Agency:     HH Arranged:  RN, PT, OT, Nurse's Aide [place] Agency:  [place]  Status of Service:  Completed, signed off  If discussed at [place] of Stay Meetings, dates discussed:  5/24  Discharge Disposition: home with home health   Additional Comments:  03/12/17- 1020- Delissa Silba RN, CM- pt for d/c home today - spoke with Dr. Doylene Canard- who gave verbal order to resume [facility] services- HHRN/PT/OT/aide- Santiago Glad with [facility] notified of resumption and discharge. For today  Dahlia Client Romeo Rabon, RN 03/12/2017, 10:21 AM

## 2017-03-12 NOTE — Progress Notes (Signed)
Patient discharged home with instructions given on medications and follow up visits,verbalized understanding. Prescriptions sent to Pharmacy of choice documented on AVS. [facility] to follow up with patient at home. No c/o pain or discomfort noted. Accompanied by staff to an awaiting vehicle.

## 2017-03-12 NOTE — Care Management Important Message (Signed)
Important Message  Patient Details  Name: Abigail [name] MRN: [id number] Date of Birth: April 21, 1946   Medicare Important Message Given:  Yes    [name] May 03/12/2017, 10:23 AM

## 2017-03-14 NOTE — Progress Notes (Signed)
Late entry Ref: Avbuere, Edwin, MD   Subjective:  INR 2.67. Warfarin was 2 mg. q 6 PM.  Objective:  Vital Signs in the last 24 hours:    Physical Exam: BP Readings from Last 1 Encounters:  03/12/17 140/84    Wt Readings from Last 1 Encounters:  03/12/17 61 kg (134 lb 6.4 oz)    Weight change:  Body mass index is 22.37 kg/m. HEENT: [place]/AT, Eyes-Brown, PERL, EOMI, Conjunctiva-Pale, Sclera-Non-icteric Neck: No JVD, No bruit, Trachea midline. Lungs:  Clear, Bilateral. Cardiac:  Regular rhythm, normal S1 and S2, no S3. II/VI systolic murmur. Abdomen:  Soft, non-tender. BS present. Extremities:  No edema present. No cyanosis. No clubbing. Left heel tenderness. CNS: AxOx3, Cranial nerves grossly intact, moves all 4 extremities.  Skin: Warm and dry.   Intake/Output from previous day: No intake/output data recorded.    Lab Results: BMET    Component Value Date/Time   NA 141 03/10/2017 0302   NA 142 03/06/2017 0402   NA 141 03/05/2017 0304   NA 143 02/12/2017 1035   NA 145 01/29/2017 1428   NA 140 12/18/2016 0932   K 4.4 03/10/2017 0302   K 3.8 03/06/2017 0402   K 3.2 (L) 03/05/2017 0304   K 3.8 02/12/2017 1035   K 3.7 01/29/2017 1428   K 4.9 12/18/2016 0932   CL 115 (H) 03/10/2017 0302   CL 113 (H) 03/06/2017 0402   CL 109 03/05/2017 0304   CO2 20 (L) 03/10/2017 0302   CO2 22 03/06/2017 0402   CO2 22 03/05/2017 0304   CO2 23 02/12/2017 1035   CO2 25 01/29/2017 1428   CO2 24 12/18/2016 0932   GLUCOSE 84 03/10/2017 0302   GLUCOSE 82 03/06/2017 0402   GLUCOSE 87 03/05/2017 0304   GLUCOSE 106 02/12/2017 1035   GLUCOSE 127 01/29/2017 1428   GLUCOSE 138 12/18/2016 0932   BUN 5 (L) 03/10/2017 0302   BUN 6 03/06/2017 0402   BUN 6 03/05/2017 0304   BUN 20.6 02/12/2017 1035   BUN 19.1 01/29/2017 1428   BUN 16.8 12/18/2016 0932   CREATININE 0.81 03/10/2017 0302   CREATININE 0.75 03/06/2017 0402   CREATININE 0.85 03/05/2017 0304   CREATININE 1.0 02/12/2017 1035   CREATININE 0.9 01/29/2017 1428   CREATININE 1.7 (H) 12/18/2016 0932   CALCIUM 8.5 (L) 03/10/2017 0302   CALCIUM 7.8 (L) 03/06/2017 0402   CALCIUM 8.2 (L) 03/05/2017 0304   CALCIUM 9.3 02/12/2017 1035   CALCIUM 9.2 01/29/2017 1428   CALCIUM 9.9 12/18/2016 0932   GFRNONAA >60 03/10/2017 0302   GFRNONAA >60 03/06/2017 0402   GFRNONAA >60 03/05/2017 0304   GFRAA >60 03/10/2017 0302   GFRAA >60 03/06/2017 0402   GFRAA >60 03/05/2017 0304   CBC    Component Value Date/Time   WBC 5.8 03/10/2017 0302   RBC 2.94 (L) 03/10/2017 0302   HGB 8.5 (L) 03/10/2017 0302   HGB 10.1 (L) 02/12/2017 1035   HCT 27.7 (L) 03/10/2017 0302   HCT 31.9 (L) 02/12/2017 1035   PLT 455 (H) 03/10/2017 0302   PLT 258 02/12/2017 1035   MCV 94.2 03/10/2017 0302   MCV 95.8 02/12/2017 1035   MCH 28.9 03/10/2017 0302   MCHC 30.7 03/10/2017 0302   RDW 16.6 (H) 03/10/2017 0302   RDW 15.3 (H) 02/12/2017 1035   LYMPHSABS 0.8 03/04/2017 0612   LYMPHSABS 1.0 02/12/2017 1035   MONOABS 1.5 (H) 03/04/2017 0612   MONOABS 0.9 02/12/2017 1035     EOSABS 0.1 03/04/2017 0612   EOSABS 0.1 02/12/2017 1035   BASOSABS 0.0 03/04/2017 0612   BASOSABS 0.0 02/12/2017 1035   HEPATIC Function Panel  Recent Labs  02/27/17 0230 02/28/17 0550 03/10/17 0302  PROT 4.8* 4.3* 4.7*   HEMOGLOBIN A1C No components found for: HGA1C,  MPG CARDIAC ENZYMES Lab Results  Component Value Date   CKTOTAL 129 01/01/2012   CKMB 4.6 (H) 01/01/2012   TROPONINI 2.80 (HH) 03/04/2017   TROPONINI 2.96 (HH) 03/04/2017   TROPONINI 3.61 (HH) 03/04/2017   BNP No results for input(s): PROBNP in the last 8760 hours. TSH  Recent Labs  12/06/16 2005 12/18/16 1056 12/22/16 2008  TSH 0.541 0.576 0.481   CHOLESTEROL  Recent Labs  12/07/16 0605 02/24/17 2028 03/05/17 0304  CHOL 220* 196 174    Scheduled Meds: Continuous Infusions: PRN Meds:.  Assessment/Plan: Acute left leg DVT CAD S/P stent in LAD and LCX Recent antero-lateral  wall MI Multiple myeloma H/O GI bleed Anemia of blood loss Left ventricular thrombus  Increase warfarin to 2.5 mg. Daily. Increase activity.   LOS: 8 days    [name]  MD  03/14/2017, 4:13 PM

## 2017-03-14 NOTE — Discharge Summary (Signed)
Physician Discharge Summary  Patient ID: Abigail [name] MRN: [id number] DOB/AGE: 70-Jul-1947 70 y.o.  Admit date: 03/04/2017 Discharge date: 03/12/2017  Admission Diagnoses: Acute coronary syndrome Recent antero-lateral wall MI S/P stent in LAD and LCX Hypoalbuminemia Hypomagnesemia Small LV apical thrombus Hypokalemia  Discharge Diagnoses:  Principle problem: * Acute left leg DVT* Active Problems:   CAD with chest pain   S/P stent in LAD and LCX   Multiple myeloma   H/O GI bleed   Anemia of blood loss   Left ventricular small thrombus   Hypoalbuminemia   Hypokalemia -corrected   Hypomagnesemia-corrected   Left heel pain   Sinus bradycardia  Discharged Condition: fair  Hospital Course: 71 year old female with recent antero-lateral wall MI and Stents in LAD/LCX had recurrent chest pain responding to SL NTG. Her cardiac catheterization showed patent LAD and LCX stent and along with mild to moderate RCA disease mild LAD, LCX and Ramus branch disease.  She developed acute left leg DVT. This along with her apical LV thrombus, risk was taken on triple anti-platelet therapy with aspirin, Brilinta and warfarin. Beta-blocker was not used due to bradycardia.  She also developed left heel pain, gradually responding to rest and analgesics. She was discharged home in stable condition with follow up by me in 5 days to recheck PT/INR.  Consults: cardiology  Significant Diagnostic Studies: labs: Normal CBC except low Hgb of 10.3. BMEt near normal except potassium of 3.2 meq. Uric acid level was 3.1. Low albumin level of 2.1.  EKG-SR, APCs, VPCs and recent antero-lateral wall MI.  CXR: Small bilateral pleural effusions and adjacent atelectasis, stable cardiomegaly and aortic atherosclerosis.  Left foot x-ray: Flattening of Plantar arch.  Cardiac cath: Patent osteal LAD and LCx stent. Mild to moderate RCA and mild LAD and LCX disease.  Treatments: cardiac meds: Aspirin, Brilinta, atorvastatin,  warfarin and potassium.  Discharge Exam: Blood pressure 140/84, pulse (!) 55, temperature 98.6 F (37 C), temperature source Oral, resp. rate 18, height _0  (1.651 m), weight 61 kg (134 lb 6.4 oz), SpO2 100 %. General appearance: alert, cooperative and appears stated age. Head: Normocephalic, atraumatic. Eyes: Brown eyes, pale conjunctiva, corneas clear. PERRL, EOM's intact.  Neck: No adenopathy, no carotid bruit, no JVD, supple, symmetrical, trachea midline and thyroid not enlarged. Resp: Clear to auscultation bilaterally. Cardio: Regular rate and rhythm, S1, S2 normal, II/VI systolic murmur, no click, rub or gallop. GI: Soft, non-tender; bowel sounds normal; no organomegaly. Extremities: No edema, cyanosis or clubbing. Left heel tenderness. Skin: Warm and dry.  Neurologic: Alert and oriented X 3, normal strength and tone. Normal coordination and slow gait.  Disposition: 01-Home or Self Care   Allergies as of 03/12/2017      Reactions   Sulfa Antibiotics Rash      Medication List    STOP taking these medications   dexamethasone 4 MG tablet Commonly known as:  DECADRON   digoxin 0.125 MG tablet Commonly known as:  LANOXIN     TAKE these medications   acyclovir 400 MG tablet Commonly known as:  ZOVIRAX TAKE 1 TABLET (400 MG TOTAL) BY MOUTH 2 (TWO) TIMES DAILY.   amiodarone 200 MG tablet Commonly known as:  PACERONE Take 0.5 tablets (100 mg total) by mouth daily. What changed:  how much to take   aspirin EC 81 MG tablet Take 81 mg by mouth daily.   atorvastatin 80 MG tablet Commonly known as:  LIPITOR Take 1 tablet (80 mg total) by  mouth daily at 6 PM.   calcium carbonate 1250 (500 Ca) MG tablet Commonly known as:  OS-CAL - dosed in mg of elemental calcium Take 1 tablet by mouth daily with breakfast.   DULoxetine 20 MG capsule Commonly known as:  CYMBALTA Take 1 capsule (20 mg total) by mouth daily.   feeding supplement (ENSURE ENLIVE) Liqd Take 237 mLs by  mouth 2 (two) times daily between meals. What changed:  when to take this  additional instructions   ferrous sulfate 325 (65 FE) MG tablet Take 1 tablet (325 mg total) by mouth daily with breakfast.   lenalidomide 15 MG capsule Commonly known as:  REVLIMID Take 1 capsule (15 mg total) by mouth daily. For 2 weeks then 2 weeks off.   mirtazapine 15 MG tablet Commonly known as:  REMERON Take 1 tablet (15 mg total) by mouth at bedtime. What changed:  how much to take   pantoprazole 40 MG tablet Commonly known as:  PROTONIX Take 1 tablet (40 mg total) by mouth daily. What changed:  when to take this   polyethylene glycol packet Commonly known as:  MIRALAX / GLYCOLAX Take 17 g by mouth daily as needed for moderate constipation.   potassium chloride 10 MEQ tablet Commonly known as:  K-DUR,KLOR-CON Take 1 tablet (10 mEq total) by mouth every Monday, Wednesday, and Friday.   pyridostigmine 60 MG tablet Commonly known as:  MESTINON Take 0.5 tablets (30 mg total) by mouth 2 (two) times daily.   ticagrelor 90 MG Tabs tablet Commonly known as:  BRILINTA Take 1 tablet (90 mg total) by mouth 2 (two) times daily.   vitamin C 1000 MG tablet Take 1,000 mg by mouth daily.   VITAMIN D PO Take 1 capsule by mouth daily.   warfarin 5 MG tablet Commonly known as:  COUMADIN Take 1 tablet (5 mg total) by mouth daily at 6 PM.      Follow-up Information    Nolene Ebbs, MD. Schedule an appointment as soon as possible for a visit in 1 month.   Specialty:  Internal Medicine Why:  PLEASE CALL OFFICE TO ARRANGE Contact information: [place] [address] [phone]        [name], MD. Go on 03/19/2017.   Specialty:  Cardiology Why:  [address] [place] IF INCONVENIENT Contact information: [place] [address] [phone]        Ladell Pier, MD In 2 weeks.   Specialty:  Oncology Why:  as  arranged THURSDAY MAY 31ST 2:45 PM Contact information: [place] [address] [phone]        [facility]-Home Follow up.   Why:  resumption of Abigail [name] services- HHRN/PT/OT/aide Contact information: Abigail [name] [address] [phone]           Signed: Birdie Riddle 03/14/2017, 4:19 PM

## 2017-03-19 ENCOUNTER — Telehealth: Payer: Self-pay | Admitting: Nurse Practitioner

## 2017-03-19 ENCOUNTER — Ambulatory Visit (HOSPITAL_BASED_OUTPATIENT_CLINIC_OR_DEPARTMENT_OTHER): Payer: Medicare HMO | Admitting: Nurse Practitioner

## 2017-03-19 VITALS — BP 144/88 | HR 82 | Temp 98.7°F | Resp 17 | Ht 65.0 in | Wt 133.7 lb

## 2017-03-19 DIAGNOSIS — I82401 Acute embolism and thrombosis of unspecified deep veins of right lower extremity: Secondary | ICD-10-CM | POA: Diagnosis not present

## 2017-03-19 DIAGNOSIS — C9001 Multiple myeloma in remission: Secondary | ICD-10-CM

## 2017-03-19 DIAGNOSIS — C9000 Multiple myeloma not having achieved remission: Secondary | ICD-10-CM

## 2017-03-19 NOTE — Progress Notes (Addendum)
Abigail [name]   Diagnosis:  Abigail [name]  INTERVAL HISTORY:   Abigail [name] returns for follow-up. She was last seen at the [facility] 02/12/2017. Revlimid/dexamethasone resumed at that time. She was hospitalized 02/24/2017 through 03/01/2017 with an acute anterior wall MI. Stents were placed. She was readmitted 03/04/2017 with recurrent chest pain. Cardiac catheterization showed patent stents. She was found to have an acute left leg DVT. She is now on Coumadin.  She overall is feeling well. No further chest pain. She continues Coumadin. She denies any bleeding. Appetite and energy level have improved.  Objective:  Vital signs in last 24 hours:  Blood pressure (!) 144/88, pulse 82, temperature 98.7 F (37.1 C), temperature source Oral, resp. rate 17, height 5\' 5"  (1.651 m), weight 133 lb 11.2 oz (60.6 kg), SpO2 100 %.    HEENT: No thrush or ulcers. Resp: Lungs clear bilaterally. Cardio: Regular rate and rhythm. GI: Abdomen soft and nontender. No hepatomegaly. Vascular: No leg edema.   Lab Results:  Lab Results  Component Value Date   WBC 5.8 03/10/2017   HGB 8.5 (L) 03/10/2017   HCT 27.7 (L) 03/10/2017   MCV 94.2 03/10/2017   PLT 455 (H) 03/10/2017   NEUTROABS 5.5 03/04/2017    Imaging:  No results found.  Medications: I have reviewed the patient's current medications.  Assessment/Plan: 1. 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 Severe anemia secondary to #1-markedly improved  3. Diffuse lytic bone lesions secondary to Abigail [name], status post Zometa 09/18/2016(plan to continue every 3 months)  4. History of coronary artery disease/myocardial infarction  5. History of colon polyps  6. Bilateral leg edema 09/04/2016. Negative bilateral venous Doppler 09/05/2016.  7. Mild periorbital edema 09/18/2016, question related to early stye formation, question Velcade related chalazia.Improved.  8. CT scan 10/27/2016 with a new right kidney massevaluated by urology; MRI abdomen 11/18/2016 Abigail bilateral renal cysts. Most of these are simple cysts. The 2 lesions in question on the CT scan are both hemorrhagic or proteinaceous cysts. No worrisome contrast enhancement.  9. Urinary retention 10/28/2016 status post evaluation in the emergency department. Urinalysis negative for signs of infection 10/30/2016. Velcade held. Resolved.  10. Abdominal pain, nausea, vomiting.Etiology unclear. Upper endoscopy 11/18/2016 with findings of reflux esophagitis, benign appearing esophageal stenosis, small hiatal hernia and erythematous duodenopathy. CT abdomen/pelvis 11/19/2016 with no acute abnormality.  11. 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 Left lower extremity DVT May 2018. Now maintained on Coumadin.     Disposition: Abigail [name] appears stable. She resumed Revlimid (2 weeks on/2 weeks off)/dexamethasone following her last office visit 02/12/2017. She completed 1 cycle. She was subsequently hospitalized with chest pain. She was found to have a left lower extremity DVT and is now on Coumadin.  We decided to place treatment of the [name] on hold for now while she recovers from the recent hospitalizations. She will return for a follow-up visit and labs in  3 weeks. We will discuss maintenance Revlimid when she  returns.  Patient seen with Dr. Benay Spice.    [name] ANP/GNP-BC   03/19/2017  2:47 PM This was a shared visit with [name]. Abigail [name] is in clinical remission from [name]. She has been hospitalized repeatedly cardiac issues over the past few months. We decided to place treatment for [name] on hold. She has been maintained on treatment for [name] since August 2017. I doubt the coronary disease is related to treatment of the [name]. However she is at increased risk for venous thrombosis with [name] and Revlimid/Decadron.  Julieanne Manson, M.D.

## 2017-03-19 NOTE — Telephone Encounter (Signed)
Gave patient AVS and calender per 5/31 LOS.

## 2017-03-23 NOTE — Addendum Note (Signed)
Addendum  created 03/23/17 1034 by Myrtie Soman, MD   Sign clinical note

## 2017-04-02 ENCOUNTER — Emergency Department (HOSPITAL_COMMUNITY): Payer: Medicare HMO

## 2017-04-02 ENCOUNTER — Other Ambulatory Visit: Payer: Self-pay

## 2017-04-02 ENCOUNTER — Inpatient Hospital Stay (HOSPITAL_COMMUNITY)
Admission: EM | Admit: 2017-04-02 | Discharge: 2017-04-08 | DRG: 291 | Disposition: A | Discharge status: Home / Self Care | Payer: Medicare HMO | Attending: Cardiovascular Disease | Admitting: Cardiovascular Disease

## 2017-04-02 ENCOUNTER — Encounter (HOSPITAL_COMMUNITY): Payer: Self-pay | Admitting: Emergency Medicine

## 2017-04-02 DIAGNOSIS — Z961 Presence of intraocular lens: Secondary | ICD-10-CM | POA: Diagnosis present

## 2017-04-02 DIAGNOSIS — Z7901 Long term (current) use of anticoagulants: Secondary | ICD-10-CM

## 2017-04-02 DIAGNOSIS — I248 Other forms of acute ischemic heart disease: Secondary | ICD-10-CM | POA: Diagnosis present

## 2017-04-02 DIAGNOSIS — E876 Hypokalemia: Secondary | ICD-10-CM | POA: Diagnosis present

## 2017-04-02 DIAGNOSIS — Z87891 Personal history of nicotine dependence: Secondary | ICD-10-CM

## 2017-04-02 DIAGNOSIS — N183 Chronic kidney disease, stage 3 (moderate): Secondary | ICD-10-CM | POA: Diagnosis present

## 2017-04-02 DIAGNOSIS — N141 Nephropathy induced by other drugs, medicaments and biological substances: Secondary | ICD-10-CM | POA: Diagnosis present

## 2017-04-02 DIAGNOSIS — I130 Hypertensive heart and chronic kidney disease with heart failure and stage 1 through stage 4 chronic kidney disease, or unspecified chronic kidney disease: Secondary | ICD-10-CM | POA: Diagnosis not present

## 2017-04-02 DIAGNOSIS — E785 Hyperlipidemia, unspecified: Secondary | ICD-10-CM | POA: Diagnosis present

## 2017-04-02 DIAGNOSIS — G44229 Chronic tension-type headache, not intractable: Secondary | ICD-10-CM | POA: Diagnosis present

## 2017-04-02 DIAGNOSIS — C9000 Multiple myeloma not having achieved remission: Secondary | ICD-10-CM | POA: Diagnosis present

## 2017-04-02 DIAGNOSIS — Z90710 Acquired absence of both cervix and uterus: Secondary | ICD-10-CM

## 2017-04-02 DIAGNOSIS — Z86718 Personal history of other venous thrombosis and embolism: Secondary | ICD-10-CM

## 2017-04-02 DIAGNOSIS — D500 Iron deficiency anemia secondary to blood loss (chronic): Secondary | ICD-10-CM | POA: Diagnosis present

## 2017-04-02 DIAGNOSIS — R0602 Shortness of breath: Secondary | ICD-10-CM

## 2017-04-02 DIAGNOSIS — Z8711 Personal history of peptic ulcer disease: Secondary | ICD-10-CM

## 2017-04-02 DIAGNOSIS — J9811 Atelectasis: Secondary | ICD-10-CM | POA: Diagnosis present

## 2017-04-02 DIAGNOSIS — Z9841 Cataract extraction status, right eye: Secondary | ICD-10-CM

## 2017-04-02 DIAGNOSIS — Z79899 Other long term (current) drug therapy: Secondary | ICD-10-CM

## 2017-04-02 DIAGNOSIS — I252 Old myocardial infarction: Secondary | ICD-10-CM

## 2017-04-02 DIAGNOSIS — K219 Gastro-esophageal reflux disease without esophagitis: Secondary | ICD-10-CM | POA: Diagnosis present

## 2017-04-02 DIAGNOSIS — Z955 Presence of coronary angioplasty implant and graft: Secondary | ICD-10-CM

## 2017-04-02 DIAGNOSIS — I5021 Acute systolic (congestive) heart failure: Secondary | ICD-10-CM | POA: Diagnosis present

## 2017-04-02 DIAGNOSIS — N179 Acute kidney failure, unspecified: Secondary | ICD-10-CM | POA: Diagnosis present

## 2017-04-02 DIAGNOSIS — T445X5A Adverse effect of predominantly beta-adrenoreceptor agonists, initial encounter: Secondary | ICD-10-CM | POA: Diagnosis present

## 2017-04-02 DIAGNOSIS — R001 Bradycardia, unspecified: Secondary | ICD-10-CM | POA: Diagnosis present

## 2017-04-02 DIAGNOSIS — Z7982 Long term (current) use of aspirin: Secondary | ICD-10-CM

## 2017-04-02 DIAGNOSIS — E861 Hypovolemia: Secondary | ICD-10-CM | POA: Diagnosis present

## 2017-04-02 DIAGNOSIS — G8929 Other chronic pain: Secondary | ICD-10-CM | POA: Diagnosis present

## 2017-04-02 DIAGNOSIS — M67472 Ganglion, left ankle and foot: Secondary | ICD-10-CM | POA: Diagnosis present

## 2017-04-02 DIAGNOSIS — R0789 Other chest pain: Secondary | ICD-10-CM

## 2017-04-02 LAB — BASIC METABOLIC PANEL
Anion gap: 10 (ref 5–15)
BUN: 14 mg/dL (ref 6–20)
CALCIUM: 8.9 mg/dL (ref 8.9–10.3)
CHLORIDE: 111 mmol/L (ref 101–111)
CO2: 21 mmol/L — ABNORMAL LOW (ref 22–32)
CREATININE: 0.81 mg/dL (ref 0.44–1.00)
Glucose, Bld: 98 mg/dL (ref 65–99)
Potassium: 3.2 mmol/L — ABNORMAL LOW (ref 3.5–5.1)
SODIUM: 142 mmol/L (ref 135–145)

## 2017-04-02 LAB — CBC
HCT: 32.9 % — ABNORMAL LOW (ref 36.0–46.0)
Hemoglobin: 10.1 g/dL — ABNORMAL LOW (ref 12.0–15.0)
MCH: 28.9 pg (ref 26.0–34.0)
MCHC: 30.7 g/dL (ref 30.0–36.0)
MCV: 94 fL (ref 78.0–100.0)
PLATELETS: 215 10*3/uL (ref 150–400)
RBC: 3.5 MIL/uL — AB (ref 3.87–5.11)
RDW: 16.9 % — AB (ref 11.5–15.5)
WBC: 4.4 10*3/uL (ref 4.0–10.5)

## 2017-04-02 LAB — MAGNESIUM: MAGNESIUM: 1.3 mg/dL — AB (ref 1.7–2.4)

## 2017-04-02 LAB — BRAIN NATRIURETIC PEPTIDE: B NATRIURETIC PEPTIDE 5: 1504.6 pg/mL — AB (ref 0.0–100.0)

## 2017-04-02 LAB — TROPONIN I
TROPONIN I: 0.08 ng/mL — AB (ref <0.03)
TROPONIN I: 0.1 ng/mL — AB (ref <0.03)
Troponin I: 0.09 ng/mL (ref <0.03)

## 2017-04-02 LAB — PROTIME-INR
INR: 1.43
PROTHROMBIN TIME: 17.6 s — AB (ref 11.4–15.2)

## 2017-04-02 LAB — I-STAT TROPONIN, ED
TROPONIN I, POC: 0.06 ng/mL (ref 0.00–0.08)
Troponin i, poc: 0.03 ng/mL (ref 0.00–0.08)

## 2017-04-02 MED ORDER — WARFARIN SODIUM 2.5 MG PO TABS: 2.5000 mg | ORAL_TABLET | Freq: Once | ORAL | Status: DC

## 2017-04-02 MED ORDER — HEPARIN (PORCINE) IN NACL 100-0.45 UNIT/ML-% IJ SOLN
800.0000 [IU]/h | INTRAMUSCULAR | Status: DC
  Administered 2017-04-02 – 2017-04-03 (×3): 800 [IU]/h via INTRAVENOUS
  Filled 2017-04-02 (×2): qty 250

## 2017-04-02 MED ORDER — FERROUS SULFATE 325 (65 FE) MG PO TABS
325.0000 mg | ORAL_TABLET | Freq: Every day | ORAL | Status: DC
  Administered 2017-04-03 – 2017-04-08 (×6): 325 mg via ORAL
  Filled 2017-04-02 (×6): qty 1

## 2017-04-02 MED ORDER — PANTOPRAZOLE SODIUM 40 MG PO TBEC
40.0000 mg | DELAYED_RELEASE_TABLET | Freq: Every day | ORAL | Status: DC
  Administered 2017-04-02 – 2017-04-08 (×7): 40 mg via ORAL
  Filled 2017-04-02 (×7): qty 1

## 2017-04-02 MED ORDER — CARVEDILOL 3.125 MG PO TABS
3.1250 mg | ORAL_TABLET | Freq: Two times a day (BID) | ORAL | Status: DC
  Administered 2017-04-02 – 2017-04-05 (×6): 3.125 mg via ORAL
  Filled 2017-04-02 (×8): qty 1

## 2017-04-02 MED ORDER — DEXTROSE 5 % IV SOLN
3.0000 g | Freq: Once | INTRAVENOUS | Status: AC
  Administered 2017-04-02: 3 g via INTRAVENOUS
  Filled 2017-04-02: qty 6

## 2017-04-02 MED ORDER — FUROSEMIDE 10 MG/ML IJ SOLN
40.0000 mg | Freq: Two times a day (BID) | INTRAMUSCULAR | Status: DC
  Administered 2017-04-02 – 2017-04-03 (×3): 40 mg via INTRAVENOUS
  Filled 2017-04-02 (×3): qty 4

## 2017-04-02 MED ORDER — TRAMADOL HCL 50 MG PO TABS
50.0000 mg | ORAL_TABLET | Freq: Two times a day (BID) | ORAL | Status: DC | PRN
  Administered 2017-04-02 – 2017-04-08 (×8): 50 mg via ORAL
  Filled 2017-04-02 (×9): qty 1

## 2017-04-02 MED ORDER — LOSARTAN POTASSIUM 25 MG PO TABS
25.0000 mg | ORAL_TABLET | Freq: Every day | ORAL | Status: DC
  Administered 2017-04-02 – 2017-04-04 (×3): 25 mg via ORAL
  Filled 2017-04-02 (×4): qty 1

## 2017-04-02 MED ORDER — ALBUTEROL SULFATE (2.5 MG/3ML) 0.083% IN NEBU: 2.5000 mg | INHALATION_SOLUTION | Freq: Four times a day (QID) | RESPIRATORY_TRACT | Status: DC | PRN

## 2017-04-02 MED ORDER — TICAGRELOR 90 MG PO TABS
90.0000 mg | ORAL_TABLET | Freq: Two times a day (BID) | ORAL | Status: DC
  Administered 2017-04-02 – 2017-04-08 (×13): 90 mg via ORAL
  Filled 2017-04-02 (×13): qty 1

## 2017-04-02 MED ORDER — ACYCLOVIR 400 MG PO TABS
400.0000 mg | ORAL_TABLET | Freq: Two times a day (BID) | ORAL | Status: DC
  Administered 2017-04-02 – 2017-04-08 (×13): 400 mg via ORAL
  Filled 2017-04-02 (×14): qty 1

## 2017-04-02 MED ORDER — ASPIRIN EC 81 MG PO TBEC
81.0000 mg | DELAYED_RELEASE_TABLET | Freq: Every day | ORAL | Status: DC
  Administered 2017-04-02 – 2017-04-04 (×3): 81 mg via ORAL
  Filled 2017-04-02 (×3): qty 1

## 2017-04-02 MED ORDER — ATORVASTATIN CALCIUM 80 MG PO TABS
80.0000 mg | ORAL_TABLET | Freq: Every day | ORAL | Status: DC
  Administered 2017-04-02 – 2017-04-07 (×6): 80 mg via ORAL
  Filled 2017-04-02 (×6): qty 1

## 2017-04-02 MED ORDER — WARFARIN SODIUM 7.5 MG PO TABS: 7.5000 mg | ORAL_TABLET | Freq: Every day | ORAL | Status: DC

## 2017-04-02 MED ORDER — POTASSIUM CHLORIDE CRYS ER 10 MEQ PO TBCR: 10.0000 meq | EXTENDED_RELEASE_TABLET | ORAL | Status: DC

## 2017-04-02 MED ORDER — AMIODARONE HCL 100 MG PO TABS
100.0000 mg | ORAL_TABLET | Freq: Every day | ORAL | Status: DC
  Administered 2017-04-02 – 2017-04-08 (×6): 100 mg via ORAL
  Filled 2017-04-02 (×7): qty 1

## 2017-04-02 MED ORDER — SODIUM CHLORIDE 0.9 % IV SOLN: 250.0000 mL | INTRAVENOUS | Status: DC | PRN

## 2017-04-02 MED ORDER — POLYETHYLENE GLYCOL 3350 17 G PO PACK
17.0000 g | PACK | Freq: Every day | ORAL | Status: DC | PRN
  Administered 2017-04-04: 17 g via ORAL
  Filled 2017-04-02: qty 1

## 2017-04-02 MED ORDER — SODIUM CHLORIDE 0.9% FLUSH
3.0000 mL | Freq: Two times a day (BID) | INTRAVENOUS | Status: DC
  Administered 2017-04-02 – 2017-04-08 (×11): 3 mL via INTRAVENOUS

## 2017-04-02 MED ORDER — WARFARIN SODIUM 7.5 MG PO TABS
7.5000 mg | ORAL_TABLET | Freq: Every day | ORAL | Status: DC
  Administered 2017-04-02: 7.5 mg via ORAL
  Filled 2017-04-02: qty 1

## 2017-04-02 MED ORDER — DULOXETINE HCL 20 MG PO CPEP: 20.0000 mg | ORAL_CAPSULE | Freq: Every day | ORAL | Status: DC

## 2017-04-02 MED ORDER — ONDANSETRON HCL 4 MG/2ML IJ SOLN: 4.0000 mg | Freq: Four times a day (QID) | INTRAMUSCULAR | Status: DC | PRN

## 2017-04-02 MED ORDER — MIRTAZAPINE 15 MG PO TABS
30.0000 mg | ORAL_TABLET | Freq: Every day | ORAL | Status: DC
  Administered 2017-04-02 – 2017-04-07 (×6): 30 mg via ORAL
  Filled 2017-04-02 (×6): qty 2

## 2017-04-02 MED ORDER — WARFARIN - PHYSICIAN DOSING INPATIENT: Freq: Every day | Status: DC

## 2017-04-02 MED ORDER — POTASSIUM CHLORIDE CRYS ER 20 MEQ PO TBCR
40.0000 meq | EXTENDED_RELEASE_TABLET | Freq: Every day | ORAL | Status: DC
  Administered 2017-04-02 – 2017-04-04 (×3): 40 meq via ORAL
  Filled 2017-04-02 (×4): qty 2

## 2017-04-02 MED ORDER — ENSURE ENLIVE PO LIQD
237.0000 mL | Freq: Two times a day (BID) | ORAL | Status: DC
  Administered 2017-04-02 – 2017-04-08 (×7): 237 mL via ORAL

## 2017-04-02 MED ORDER — VITAMIN C 500 MG PO TABS
1000.0000 mg | ORAL_TABLET | Freq: Every day | ORAL | Status: DC
  Administered 2017-04-02 – 2017-04-08 (×7): 1000 mg via ORAL
  Filled 2017-04-02 (×7): qty 2

## 2017-04-02 MED ORDER — PYRIDOSTIGMINE BROMIDE 60 MG PO TABS
30.0000 mg | ORAL_TABLET | Freq: Two times a day (BID) | ORAL | Status: DC
  Administered 2017-04-02 – 2017-04-08 (×13): 30 mg via ORAL
  Filled 2017-04-02 (×14): qty 0.5

## 2017-04-02 MED ORDER — CALCIUM CARBONATE 1250 (500 CA) MG PO TABS
1.0000 | ORAL_TABLET | Freq: Every day | ORAL | Status: DC
  Administered 2017-04-03 – 2017-04-08 (×6): 500 mg via ORAL
  Filled 2017-04-02 (×6): qty 1

## 2017-04-02 MED ORDER — ACETAMINOPHEN 325 MG PO TABS
650.0000 mg | ORAL_TABLET | ORAL | Status: DC | PRN
  Administered 2017-04-03 – 2017-04-07 (×3): 650 mg via ORAL
  Filled 2017-04-02 (×4): qty 2

## 2017-04-02 MED ORDER — WARFARIN SODIUM 5 MG PO TABS: 5.0000 mg | ORAL_TABLET | Freq: Every day | ORAL | Status: DC

## 2017-04-02 MED ORDER — VITAMIN D 1000 UNITS PO TABS
1000.0000 [IU] | ORAL_TABLET | Freq: Every day | ORAL | Status: DC
  Administered 2017-04-02 – 2017-04-08 (×7): 1000 [IU] via ORAL
  Filled 2017-04-02 (×7): qty 1

## 2017-04-02 MED ORDER — SODIUM CHLORIDE 0.9% FLUSH: 3.0000 mL | INTRAVENOUS | Status: DC | PRN

## 2017-04-02 NOTE — Progress Notes (Signed)
Still waiting for iv team to insert iv access.Heparin drip was stopped previous shift .

## 2017-04-02 NOTE — ED Triage Notes (Signed)
Patient woke up this morning around 0400 with shortness of breath and intermittent chest pain.  She states she had a heart attack three weeks ago.  Denies any nausea or vomiting at this time.

## 2017-04-02 NOTE — ED Notes (Signed)
Placed pt on bedpan, tolerated well. 

## 2017-04-02 NOTE — Progress Notes (Signed)
CRITICAL VALUE ALERT  Critical Value:  Troponin 0.08  Date & Time Notied:  04/02/17 1228pm  Provider Notified: Dr. Doylene Canard  Orders Received/Actions taken: Heparin per pharmacy

## 2017-04-02 NOTE — ED Notes (Signed)
Attempted report 

## 2017-04-02 NOTE — ED Notes (Signed)
Ambulated pt in hallway around nurses station. Pt's O2 started at 100% while ambulating pt's O2 dropped to 95%. While ambulating pt stated she felt "woozie." Pt placed back in bed and on monitor.

## 2017-04-02 NOTE — Progress Notes (Signed)
ANTICOAGULATION CONSULT NOTE - Initial Consult  Pharmacy Consult for Heparin , Warfarin per MD Indication: DVT/ ACS  Allergies  Allergen Reactions  . Sulfa Antibiotics Rash    Patient Measurements: Height: 5' 5.5" (166.4 cm) Weight: 133 lb 6.4 oz (60.5 kg) IBW/kg (Calculated) : 58.15   Vital Signs: Temp: 98.4 F (36.9 C) (06/14 1122) Temp Source: Oral (06/14 1122) BP: 172/84 (06/14 1122) Pulse Rate: 103 (06/14 1122)  Labs:  Recent Labs  04/02/17 0559 04/02/17 1054  HGB 10.1*  --   HCT 32.9*  --   PLT 215  --   LABPROT 17.6*  --   INR 1.43  --   CREATININE 0.81  --   TROPONINI  --  0.08*    Estimated Creatinine Clearance: 59.4 mL/min (by C-G formula based on SCr of 0.81 mg/dL).   Medical History: Past Medical History:  Diagnosis Date  . Anemia    "off and on all my life" (12/22/2016)  . Anxiety   . Bilateral renal artery stenosis ([place]) 1999   s/p stenting  . Chronic kidney disease (CKD), stage III (moderate)    Archie Endo 12/22/2016  . Coronary artery disease 1999  . Depression   . Diverticulosis   . Duodenitis   . Gastric erosions   . Gastric ulcer   . GERD (gastroesophageal reflux disease)   . Heart murmur   . History of blood transfusion ~ 03/2016   "low HgB; practically nonexistent"  . Hyperlipidemia   . Hypertension   . MI (myocardial infarction) ([place]) 1999  . Multiple myeloma ([place]) dx'd ~ 04/2016  . PAT (paroxysmal atrial tachycardia) ([place]) 12/22/2016   Archie Endo 12/22/2016  . Retinal hemorrhage, right eye   . Schatzki's ring   . Tachy-brady syndrome ([place])    Archie Endo 12/22/2016  . Tubular adenoma of colon      Assessment: 71yof admitted with CP and SOB recent DES to LAD and LCx - on ticagrelor and ASA pta with bump in Troponin 0.08 and BNP 1500.  HX EF 40% with moderate RV dysfunction 5/18.  Hx DVT on warfarin PTA with INR 1.4 on admit.  Begin heparin drip until INR > 2.   CBC stable, no bleeding noted.   K 3.2  low - replace by MD, Mag 1.3 low -  replace, Cr stable 0.8   Goal of Therapy:  INR 2-3 Heparin level 0.3-0.7 units/ml Monitor platelets by anticoagulation protocol: Yes   Plan:  MD already orderd warfarin 7.72m today  Daily INR Heparin drip - no bolus - 800 uts/hr HL 6hr after start Daily HL, CBC  LBonnita NasutiPharm.D. CPP, BCPS Clinical Pharmacist [phone]/14/2018 2:16 PM

## 2017-04-02 NOTE — ED Provider Notes (Signed)
[facility] DEPT Provider Note   CSN: [id number] Arrival date & time: 04/02/17  0543     History   Chief Complaint Chief Complaint  Patient presents with  . Shortness of Breath  . Chest Pain    HPI Abigail [name] is a 71 y.o. female with h/o of multiple myeloma (on remission and monitoring), anemia, HTN, HLD, CAD s/p stents and anterior wall MI on 02/24/17, left leg DVT, small apical LV thrombus presents to ED with sudden onset chest pain associated with shortness of breath that woke her up during her sleep this morning. CP has been intermittent, and stopped PTA.  She describes shortness of breath as "can't breathe through my nose" and having to breathe through her mouth. She has intermittent LLE pain and swelling, h/o DVT on anticoagulants.   She denies associated diaphoresis, palpitations, light-headedness. No preceding URI illness. Up until yesterday patient was feeling well.   HPI  Past Medical History:  Diagnosis Date  . Anemia    "off and on all my life" (12/22/2016)  . Anxiety   . Bilateral renal artery stenosis ([place]) 1999   s/p stenting  . Chronic kidney disease (CKD), stage III (moderate)    Archie Endo 12/22/2016  . Coronary artery disease 1999  . Depression   . Diverticulosis   . Duodenitis   . Gastric erosions   . Gastric ulcer   . GERD (gastroesophageal reflux disease)   . Heart murmur   . History of blood transfusion ~ 03/2016   "low HgB; practically nonexistent"  . Hyperlipidemia   . Hypertension   . MI (myocardial infarction) ([place]) 1999  . Multiple myeloma ([place]) dx'd ~ 04/2016  . PAT (paroxysmal atrial tachycardia) ([place]) 12/22/2016   Archie Endo 12/22/2016  . Retinal hemorrhage, right eye   . Schatzki's ring   . Tachy-brady syndrome ([place])    Archie Endo 12/22/2016  . Tubular adenoma of colon     Patient Active Problem List   Diagnosis Date Noted  . STEMI (ST elevation myocardial infarction) ([place]) 02/24/2017  . Acute anterolateral wall MI ([place]) 02/24/2017  . Dehydration  01/21/2017  . Acute coronary syndrome ([place]) 12/24/2016  . Dizziness 12/22/2016    Class: Acute  . Orthostatic hypotension 12/08/2016  . Syncope 12/08/2016  . Syncope and collapse 12/06/2016  . Tachycardia-bradycardia ([place])   . Essential hypertension   . Nausea and vomiting in adult patient   . Erosive esophagitis   . Protein-calorie malnutrition, severe 11/19/2016  . LLQ pain   . LUQ abdominal pain   . Intractable nausea and vomiting 11/17/2016  . Abdominal pain 11/05/2016  . Multiple myeloma ([place]) 06/18/2016  . Malnutrition of moderate degree 06/14/2016  . 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 Exertional dyspnea 11/01/2015  . Coronary atherosclerosis of native coronary artery 11/01/2015  . Shortness of breath 11/01/2015  . Chest pain 12/31/2011    Past Surgical History:  Procedure Laterality Date  . APPENDECTOMY    . BACK SURGERY    . CATARACT EXTRACTION W/ INTRAOCULAR LENS IMPLANT Right 2014  . CORONARY ANGIOGRAPHY N/A 01/23/2017   Procedure: Coronary Angiography;  Surgeon: [name], MD;  Location: [facility];  Service: Cardiovascular;  Laterality: N/A;  . CORONARY ANGIOPLASTY WITH STENT PLACEMENT  1999  . CORONARY STENT INTERVENTION N/A 12/29/2016   Procedure: Coronary Stent Intervention;  Surgeon: Charolette Forward, MD;  Location: [facility];  Service: Cardiovascular;  Laterality: N/A;  . CORONARY STENT INTERVENTION N/A 02/24/2017  Procedure: Coronary Stent Intervention;  Surgeon: Charolette Forward, MD;  Location: [facility];  Service: Cardiovascular;  Laterality: N/A;  . ESOPHAGOGASTRODUODENOSCOPY N/A 11/03/2015   Procedure: ESOPHAGOGASTRODUODENOSCOPY (EGD);  Surgeon: Carol Ada, MD;  Location: [facility] ENDOSCOPY;  Service: Endoscopy;  Laterality: N/A;  . ESOPHAGOGASTRODUODENOSCOPY (EGD) WITH PROPOFOL N/A 11/18/2016   Procedure: ESOPHAGOGASTRODUODENOSCOPY (EGD) WITH PROPOFOL;  Surgeon: Ladene Artist, MD;  Location: [facility]  ENDOSCOPY;  Service: Endoscopy;  Laterality: N/A;  . LEFT HEART CATH AND CORONARY ANGIOGRAPHY N/A 12/24/2016   Procedure: Left Heart Cath and Coronary Angiography;  Surgeon: [name], MD;  Location: [facility];  Service: Cardiovascular;  Laterality: N/A;  . LEFT HEART CATH AND CORONARY ANGIOGRAPHY N/A 02/24/2017   Procedure: Left Heart Cath and Coronary Angiography;  Surgeon: Charolette Forward, MD;  Location: [place] [facility];  Service: Cardiovascular;  Laterality: N/A;  . LEFT HEART CATH AND CORONARY ANGIOGRAPHY N/A 03/05/2017   Procedure: Left Heart Cath and Coronary Angiography;  Surgeon: [name], MD;  Location: [place] [facility];  Service: Cardiovascular;  Laterality: N/A;  . LUMBAR [place]    . RENAL ARTERY STENT  1999   bil RAS so ? bil vs unilateral stents  . RETINAL LASER PROCEDURE Right 2012   "bleeding"  . TONSILLECTOMY    . TOTAL ABDOMINAL HYSTERECTOMY      OB History    No data available       Home Medications    Prior to Admission medications   Medication Sig Start Date End Date Taking? Authorizing Provider  acyclovir (ZOVIRAX) 400 MG tablet TAKE 1 TABLET (400 MG TOTAL) BY MOUTH 2 (TWO) TIMES DAILY. 08/29/16   Ladell Pier, MD  amiodarone (PACERONE) 200 MG tablet Take 0.5 tablets (100 mg total) by mouth daily. 03/12/17   [name], MD  Ascorbic Acid (VITAMIN C) 1000 MG tablet Take 1,000 mg by mouth daily.     [provider]  aspirin EC 81 MG tablet Take 81 mg by mouth daily.    [provider]  atorvastatin (LIPITOR) 80 MG tablet Take 1 tablet (80 mg total) by mouth daily at 6 PM. 03/01/17   [name], MD  calcium carbonate (OS-CAL - DOSED IN MG OF ELEMENTAL CALCIUM) 1250 (500 Ca) MG tablet Take 1 tablet by mouth daily with breakfast.    [provider]  Cholecalciferol (VITAMIN D PO) Take 1 capsule by mouth daily.    [provider]  DULoxetine (CYMBALTA) 20 MG capsule Take 1 capsule (20 mg total) by mouth  daily. 12/10/16   Lavina Hamman, MD  feeding supplement, ENSURE ENLIVE, (ENSURE ENLIVE) LIQD Take 237 mLs by mouth 2 (two) times daily between meals. Patient taking differently: Take 237 mLs by mouth See admin instructions. Drink 239m by mouth 2-3 times daily 11/27/16   NMariel Aloe MD  ferrous sulfate 325 (65 FE) MG tablet Take 1 tablet (325 mg total) by mouth daily with breakfast. 03/02/17   KDixie Dials MD  lenalidomide (REVLIMID) 15 MG capsule Take 1 capsule (15 mg total) by mouth daily. For 2 weeks then 2 weeks off. 02/12/17   SLadell Pier MD  mirtazapine (REMERON) 15 MG tablet Take 1 tablet (15 mg total) by mouth at bedtime. Patient taking differently: Take 30 mg by mouth at bedtime.  12/27/16   KDixie Dials MD  pantoprazole (PROTONIX) 40 MG tablet Take 1 tablet (40 mg total) by mouth daily. 03/12/17   KDixie Dials MD  polyethylene glycol (MIRALAX / GLYCOLAX) packet Take 17 g by mouth daily as needed for moderate constipation.     [provider]  potassium chloride (K-DUR,KLOR-CON) 10 MEQ tablet Take 1 tablet (10 mEq total) by mouth every Monday, Wednesday, and Friday. 03/02/17   [name], MD  pyridostigmine (MESTINON) 60 MG tablet Take 0.5 tablets (30 mg total) by mouth 2 (two) times daily. 01/25/17   [name], MD  ticagrelor (BRILINTA) 90 MG TABS tablet Take 1 tablet (90 mg total) by mouth 2 (two) times daily. 12/27/16   [name], MD  warfarin (COUMADIN) 5 MG tablet Take 1 tablet (5 mg total) by mouth daily at 6 PM. Patient taking differently: Take 2.5 mg by mouth daily at 6 PM. Or as directed 03/12/17   [name], MD    Family History Family History  Problem Relation Age of Onset  . Colon cancer Neg Hx     Social History Social History  Substance Use Topics  . Smoking status: Former Smoker    Packs/day: 0.50    Years: 50.00    Types: Cigarettes    Quit date: 11/20/2016  . Smokeless tobacco: Never Used  . Alcohol use No     Allergies     Sulfa antibiotics   Review of Systems Review of Systems  Constitutional: Negative for fever.  HENT: Positive for congestion. Negative for sore throat.   Eyes: Negative for visual disturbance.  Respiratory: Positive for shortness of breath. Negative for cough.   Cardiovascular: Positive for chest pain and leg swelling. Negative for palpitations.  Gastrointestinal: Negative for abdominal pain, constipation, diarrhea, nausea and vomiting.  Genitourinary: Negative for difficulty urinating.  Musculoskeletal: Positive for myalgias. Negative for joint swelling.  Allergic/Immunologic: Positive for immunocompromised state.  Neurological: Negative for headaches.  Hematological: Bruises/bleeds easily.     Physical Exam Updated Vital Signs BP 126/75   Pulse 63   Temp 98.2 F (36.8 C) (Oral)   Resp (!) 23   SpO2 100%   Physical Exam  Constitutional: She is oriented to person, place, and time. She appears well-developed and well-nourished. No distress.  HENT:  Head: Normocephalic and atraumatic.  Nose: Nose normal.  Moist mucous membranes Oropharynx and tonsils normal No mucosal edema or rhinorrhea   Eyes: Conjunctivae and EOM are normal. Pupils are equal, round, and reactive to light.  Neck: Normal range of motion. Neck supple.  Cardiovascular: Normal rate, regular rhythm, S1 normal, S2 normal, normal heart sounds and intact distal pulses.  Exam reveals no friction rub.   No murmur heard. No JVD.  No LE edema. Carotid, radial, DP pulses 2+ bilaterally   Pulmonary/Chest: Effort normal and breath sounds normal. No respiratory distress. She has no decreased breath sounds. She has no wheezes. She has no rhonchi. She has no rales. She exhibits no tenderness.  Orthopnea  Abdominal: Soft. Bowel sounds are normal. She exhibits no mass. There is no tenderness. There is no rebound and no guarding.  Abdomen soft, NTND  Musculoskeletal: Normal range of motion. She exhibits no deformity.   Lymphadenopathy:    She has no cervical adenopathy.  Neurological: She is alert and oriented to person, place, and time. No sensory deficit.  Skin: Skin is warm and dry. Capillary refill takes less than 2 seconds.  Psychiatric: She has a normal mood and affect. Her behavior is normal. Judgment and thought content normal.  Nursing note and vitals reviewed.    ED Treatments / Results  Labs (all labs ordered  are listed, but only abnormal results are displayed) Labs Reviewed  BASIC METABOLIC PANEL - Abnormal; Notable for the following:       Result Value   Potassium 3.2 (*)    CO2 21 (*)    All other components within normal limits  CBC - Abnormal; Notable for the following:    RBC 3.50 (*)    Hemoglobin 10.1 (*)    HCT 32.9 (*)    RDW 16.9 (*)    All other components within normal limits  PROTIME-INR - Abnormal; Notable for the following:    Prothrombin Time 17.6 (*)    All other components within normal limits  BRAIN NATRIURETIC PEPTIDE - Abnormal; Notable for the following:    B Natriuretic Peptide 1,504.6 (*)    All other components within normal limits  I-STAT TROPOININ, ED  I-STAT TROPOININ, ED    EKG  EKG Interpretation  Date/Time:  Thursday April 02 2017 05:49:05 EDT Ventricular Rate:  74 PR Interval:  174 QRS Duration: 92 QT Interval:  482 QTC Calculation: 535 R Axis:   -51 Text Interpretation:  Normal sinus rhythm with sinus arrhythmia Left anterior fascicular block Septal infarct , age undetermined Lateral infarct , age undetermined T wave abnormality, consider anterior ischemia Abnormal ECG Since prior ECG, rate has increased, similar ST morphology to prior Confirmed by Gareth Morgan [phone]) on 04/02/2017 6:07:43 AM Also confirmed by Gareth Morgan [phone]), editor Hattie Perch ([id number])  on 04/02/2017 7:05:12 AM       Radiology Dg Chest Portable 1 View  Result Date: 04/02/2017 CLINICAL DATA:  Shortness of breath today. History of hypertension,  multiple myeloma. EXAM: PORTABLE CHEST 1 VIEW COMPARISON:  Chest radiograph Mar 04, 2017 FINDINGS: The cardiac silhouette is mildly enlarged, coronary artery stent noted. Calcified aortic knob. Bibasilar strandy densities. No pleural effusion. No pneumothorax. Faint lucencies in the clavicle. Old LEFT rib fracture. IMPRESSION: Stable cardiomegaly.  Bibasilar atelectasis/scarring. Faint lucencies in the RIGHT clavicle may reflect patient's myeloma. Electronically Signed   By: Elon Alas M.D.   On: 04/02/2017 06:32    Procedures Procedures (including critical care time)  Medications Ordered in ED Medications - No data to display   Initial Impression / Assessment and Plan / ED Course  I have reviewed the triage vital signs and the nursing notes.  Pertinent labs & imaging results that were available during my care of the patient were reviewed by me and considered in my medical decision making (see chart for details).  Clinical Course as of Apr 02 941  Thu Apr 02, 2017  0648 Left anterior fascicular block Septal infarct , age undetermined Lateral infarct , age undetermined T wave abnormality, consider anterior ischemia Abnormal ECG Since prior ECG, rate has increased, similar ST morphology to prior Confirmed by Gareth Morgan [phone]) on 04/02/2017 6:07:43 AM EKG 12-Lead [CG]  0649 IMPRESSION: Stable cardiomegaly. Bibasilar atelectasis/scarring.  Faint lucencies in the RIGHT clavicle may reflect patient's myeloma. DG Chest Portable 1 View [CG]  0711 Potassium: (!) 3.2 [CG]  0711 B Natriuretic Peptide: (!) 1,504.6 [CG]  0711 Stable anemia Hemoglobin: (!) 10.1 [CG]  0939 Spoke to Dr. Raliegh Ip  [CG]    Clinical Course User Index [CG] Kinnie Feil, PA-C    71 yo female with h/o of multiple myeloma, anemia, HTN, HLD, CAD s/p stents and anterior wall MI on 02/24/17, left leg DVT, small apical LV thrombus presents to ED with sudden onset chest pain associated with shortness of breath.  She is  not having CP anymore, has SOB. On exam VS are wnl, she is well appearing. RRR, lungs clear, no JVD or LE edema. Intact distal pulses. Chest is not TTP. She does have orthopnea. Concerned for ACS, arrhythmia, PNA, PE. She is on aspirin, brilinta and coumadin for LE DVT. Last seen by Dr. Doylene Canard yesterday in office.  CXR with stable cardiomegaly without effusions. EKG unchanged from prior. Troponin normal, 0.06. CBC remarkable with anemia, stable. CMP with hypokalemia at 3.2. PT/INR 17.6/1.43. BNP ~1500s, last 700s last year. Pending delta troponin.   0830: Patient ambulated around ED with normal O2 sats. Continues to be pain free.   0900: Delta troponin 0.03. Will contact Dr. Doylene Canard for further recommendations.   Final Clinical Impressions(s) / ED Diagnoses   Final diagnoses:  Shortness of breath  Chest wall pain   Spoke to Dr. Doylene Canard who will evaluate patient in ED and admit for observation. High suspicion for PE.  New Prescriptions New Prescriptions   No medications on file     Arlean Hopping 04/02/17 [place], [place], MD 04/02/17 1145

## 2017-04-02 NOTE — H&P (Signed)
Referring Physician:  [name] is an 71 y.o. female.                       Chief Complaint: Chest pain and shortnes of breath  HPI: 71 year old female with recent anterior-lateral wall MI with stents in LAD and LCX has recurrent chest pain and shortness of breath with elevated BNP. She has chronic leg pain with DVT. She is on dual antiplatelet therapy + warfarin therapy. INR is sub therapeutic. Chest x-ray is without infiltration or edema.  Past Medical History:  Diagnosis Date  . Anemia    "off and on all my life" (12/22/2016)  . Anxiety   . Bilateral renal artery stenosis ([place]) 1999   [name]/p stenting  . Chronic kidney disease (CKD), stage III (moderate)    Archie Endo 12/22/2016  . Coronary artery disease 1999  . Depression   . Diverticulosis   . Duodenitis   . Gastric erosions   . Gastric ulcer   . GERD (gastroesophageal reflux disease)   . Heart murmur   . History of blood transfusion ~ 03/2016   "low HgB; practically nonexistent"  . Hyperlipidemia   . Hypertension   . MI (myocardial infarction) ([place]) 1999  . Multiple myeloma ([place]) dx'd ~ 04/2016  . PAT (paroxysmal atrial tachycardia) ([place]) 12/22/2016   Archie Endo 12/22/2016  . Retinal hemorrhage, right eye   . Schatzki'[name] ring   . Tachy-brady syndrome ([place])    Archie Endo 12/22/2016  . Tubular adenoma of colon       Past Surgical History:  Procedure Laterality Date  . APPENDECTOMY    . BACK SURGERY    . CATARACT EXTRACTION W/ INTRAOCULAR LENS IMPLANT Right 2014  . CORONARY ANGIOGRAPHY N/A 01/23/2017   Procedure: Coronary Angiography;  Surgeon: [name], MD;  Location: [place] [facility];  Service: Cardiovascular;  Laterality: N/A;  . CORONARY ANGIOPLASTY WITH STENT PLACEMENT  1999  . CORONARY STENT INTERVENTION N/A 12/29/2016   Procedure: Coronary Stent Intervention;  Surgeon: Charolette Forward, MD;  Location: [place] [facility];  Service: Cardiovascular;  Laterality: N/A;  . CORONARY STENT INTERVENTION N/A 02/24/2017   Procedure: Coronary  Stent Intervention;  Surgeon: Charolette Forward, MD;  Location: [facility];  Service: Cardiovascular;  Laterality: N/A;  . ESOPHAGOGASTRODUODENOSCOPY N/A 11/03/2015   Procedure: ESOPHAGOGASTRODUODENOSCOPY (EGD);  Surgeon: Carol Ada, MD;  Location: [facility] ENDOSCOPY;  Service: Endoscopy;  Laterality: N/A;  . ESOPHAGOGASTRODUODENOSCOPY (EGD) WITH PROPOFOL N/A 11/18/2016   Procedure: ESOPHAGOGASTRODUODENOSCOPY (EGD) WITH PROPOFOL;  Surgeon: Ladene Artist, MD;  Location: [facility] ENDOSCOPY;  Service: Endoscopy;  Laterality: N/A;  . LEFT HEART CATH AND CORONARY ANGIOGRAPHY N/A 12/24/2016   Procedure: Left Heart Cath and Coronary Angiography;  Surgeon: [name], MD;  Location: [place] [facility];  Service: Cardiovascular;  Laterality: N/A;  . LEFT HEART CATH AND CORONARY ANGIOGRAPHY N/A 02/24/2017   Procedure: Left Heart Cath and Coronary Angiography;  Surgeon: Charolette Forward, MD;  Location: [place] [facility];  Service: Cardiovascular;  Laterality: N/A;  . LEFT HEART CATH AND CORONARY ANGIOGRAPHY N/A 03/05/2017   Procedure: Left Heart Cath and Coronary Angiography;  Surgeon: [name], MD;  Location: [place] [facility];  Service: Cardiovascular;  Laterality: N/A;  . LUMBAR [place]    . RENAL ARTERY STENT  1999   bil RAS so ? bil vs unilateral stents  . RETINAL LASER PROCEDURE Right 2012   "bleeding"  . TONSILLECTOMY    .  TOTAL ABDOMINAL HYSTERECTOMY      Family History  Problem Relation Age of Onset  . Colon cancer Neg Hx    Social History:  reports that she quit smoking about 4 months ago. Her smoking use included Cigarettes. She has a 25.00 pack-year smoking history. She has never used smokeless tobacco. She reports that she does not drink alcohol or use drugs.  Allergies:  Allergies  Allergen Reactions  . Sulfa Antibiotics Rash     (Not in a hospital admission)  Results for orders placed or performed during the hospital encounter of 04/02/17 (from the past 48 hour([name]))  Basic  metabolic panel     Status: Abnormal   Collection Time: 04/02/17  5:59 AM  Result Value Ref Range   Sodium 142 135 - 145 mmol/L   Potassium 3.2 (L) 3.5 - 5.1 mmol/L   Chloride 111 101 - 111 mmol/L   CO2 21 (L) 22 - 32 mmol/L   Glucose, Bld 98 65 - 99 mg/dL   BUN 14 6 - 20 mg/dL   Creatinine, Ser 0.81 0.44 - 1.00 mg/dL   Calcium 8.9 8.9 - 10.3 mg/dL   GFR calc non Af Amer >60 >60 mL/min   GFR calc Af Amer >60 >60 mL/min    Comment: (NOTE) The eGFR has been calculated using the CKD EPI equation. This calculation has not been validated in all clinical situations. eGFR'[name] persistently <60 mL/min signify possible Chronic Kidney Disease.    Anion gap 10 5 - 15  CBC     Status: Abnormal   Collection Time: 04/02/17  5:59 AM  Result Value Ref Range   WBC 4.4 4.0 - 10.5 K/uL   RBC 3.50 (L) 3.87 - 5.11 MIL/uL   Hemoglobin 10.1 (L) 12.0 - 15.0 g/dL   HCT 32.9 (L) 36.0 - 46.0 %   MCV 94.0 78.0 - 100.0 fL   MCH 28.9 26.0 - 34.0 pg   MCHC 30.7 30.0 - 36.0 g/dL   RDW 16.9 (H) 11.5 - 15.5 %   Platelets 215 150 - 400 K/uL  Protime-INR (order if Patient is taking Coumadin / Warfarin)     Status: Abnormal   Collection Time: 04/02/17  5:59 AM  Result Value Ref Range   Prothrombin Time 17.6 (H) 11.4 - 15.2 seconds   INR 1.43   Brain natriuretic peptide     Status: Abnormal   Collection Time: 04/02/17  6:00 AM  Result Value Ref Range   B Natriuretic Peptide 1,504.6 (H) 0.0 - 100.0 pg/mL  I-stat troponin, ED     Status: None   Collection Time: 04/02/17  6:04 AM  Result Value Ref Range   Troponin i, poc 0.06 0.00 - 0.08 ng/mL   Comment 3            Comment: Due to the release kinetics of cTnI, a negative result within the first hours of the onset of symptoms does not rule out myocardial infarction with certainty. If myocardial infarction is still suspected, repeat the test at appropriate intervals.   I-Stat Troponin, ED (not at [facility])     Status: None   Collection Time: 04/02/17  9:18 AM   Result Value Ref Range   Troponin i, poc 0.03 0.00 - 0.08 ng/mL   Comment 3            Comment: Due to the release kinetics of cTnI, a negative result within the first hours of the onset of symptoms does not rule out myocardial  infarction with certainty. If myocardial infarction is still suspected, repeat the test at appropriate intervals.    Dg Chest Portable 1 View  Result Date: 04/02/2017 CLINICAL DATA:  Shortness of breath today. History of hypertension, multiple myeloma. EXAM: PORTABLE CHEST 1 VIEW COMPARISON:  Chest radiograph Mar 04, 2017 FINDINGS: The cardiac silhouette is mildly enlarged, coronary artery stent noted. Calcified aortic knob. Bibasilar strandy densities. No pleural effusion. No pneumothorax. Faint lucencies in the clavicle. Old LEFT rib fracture. IMPRESSION: Stable cardiomegaly.  Bibasilar atelectasis/scarring. Faint lucencies in the RIGHT clavicle may reflect patient'[name] myeloma. Electronically Signed   By: Elon Alas M.D.   On: 04/02/2017 06:32    Review Of Systems Constitutional: No fever, chills, Positive weight loss. Eyes: No vision change, wears glasses. No discharge or pain. Ears: No hearing loss, No tinnitus. Respiratory: No asthma, COPD, pneumonias. Positive shortness of breath. No hemoptysis. Cardiovascular: Positive chest pain, palpitation, leg edema. Gastrointestinal: No nausea, vomiting, diarrhea, constipation. Positive GI bleed. No hepatitis. Genitourinary: No dysuria, hematuria, kidney stone. No incontinance. Neurological: NPositive headache, no stroke, seizures.  Psychiatry: No psych facility admission for anxiety, depression, suicide. No detox. Skin: No rash. Musculoskeletal: Positive joint pain, fibromyalgia. No neck pain, back pain. Lymphadenopathy: No lymphadenopathy. Hematology: Positive anemia or easy bruising.   Blood pressure 126/75, pulse 63, temperature 98.2 F (36.8 C), temperature source Oral, resp. rate (!) 23, SpO2 100 %.  There is no height or weight on file to calculate BMI. General appearance: alert, cooperative, appears stated age and no distress Head: Normocephalic, atraumatic. Eyes: Brown eyes, pale pink conjunctiva, corneas clear. PERRL, EOM'[name] intact. Neck: No adenopathy, no carotid bruit, no JVD, supple, symmetrical, trachea midline and thyroid not enlarged. Resp: Clear to auscultation bilaterally. Cardio: Regular rate and rhythm, S1, S2 normal, II/VI systolic murmur, no click, rub or gallop GI: Soft, non-tender; bowel sounds normal; no organomegaly. Extremities: No edema, cyanosis or clubbing. Skin: Warm and dry.  Neurologic: Alert and oriented X 3, normal strength. Normal coordination and slow gait.  Assessment/Plan Chest pain r/o MI Acute left heart systolic failure [name]/P DVT [name]/P stent in LAD nd LCX Multiple myeloma Anemia of blood loss Hypokalemia Small apical thrombus  Place in observation. Adjust warfarin dose. IV lasix. Potassium replacement   Abigail Widmayer S, MD  04/02/2017, 10:01 AM

## 2017-04-02 NOTE — Progress Notes (Signed)
Patient arrived to 3e09 via stretcher.  Patient ambulated with standby assistance to the bed.  Patient alert and oriented and educated on call bell, phone, and tv.  Patient has no complaints of pain at this time.  Will continue to monitor.

## 2017-04-03 LAB — CBC
HEMATOCRIT: 33.4 % — AB (ref 36.0–46.0)
Hemoglobin: 10.8 g/dL — ABNORMAL LOW (ref 12.0–15.0)
MCH: 30 pg (ref 26.0–34.0)
MCHC: 32.3 g/dL (ref 30.0–36.0)
MCV: 92.8 fL (ref 78.0–100.0)
Platelets: 199 10*3/uL (ref 150–400)
RBC: 3.6 MIL/uL — AB (ref 3.87–5.11)
RDW: 16.9 % — AB (ref 11.5–15.5)
WBC: 3.6 10*3/uL — AB (ref 4.0–10.5)

## 2017-04-03 LAB — BASIC METABOLIC PANEL
ANION GAP: 12 (ref 5–15)
BUN: 14 mg/dL (ref 6–20)
CHLORIDE: 106 mmol/L (ref 101–111)
CO2: 24 mmol/L (ref 22–32)
Calcium: 9.3 mg/dL (ref 8.9–10.3)
Creatinine, Ser: 0.93 mg/dL (ref 0.44–1.00)
GFR calc non Af Amer: >60 mL/min (ref >60)
Glucose, Bld: 95 mg/dL (ref 65–99)
POTASSIUM: 3.6 mmol/L (ref 3.5–5.1)
SODIUM: 142 mmol/L (ref 135–145)

## 2017-04-03 LAB — HEPARIN LEVEL (UNFRACTIONATED)
Heparin Unfractionated: 0.56 IU/mL (ref 0.30–0.70)
Heparin Unfractionated: 0.54 IU/mL (ref 0.30–0.70)

## 2017-04-03 LAB — PROTIME-INR
INR: 1.5
PROTHROMBIN TIME: 18.2 s — AB (ref 11.4–15.2)

## 2017-04-03 LAB — MAGNESIUM: MAGNESIUM: 2.3 mg/dL (ref 1.7–2.4)

## 2017-04-03 MED ORDER — FUROSEMIDE 10 MG/ML IJ SOLN
40.0000 mg | Freq: Every day | INTRAMUSCULAR | Status: DC
  Administered 2017-04-04: 40 mg via INTRAVENOUS
  Filled 2017-04-03 (×2): qty 4

## 2017-04-03 MED ORDER — WARFARIN SODIUM 10 MG PO TABS
10.0000 mg | ORAL_TABLET | Freq: Every day | ORAL | Status: DC
  Administered 2017-04-03: 10 mg via ORAL
  Filled 2017-04-03: qty 1

## 2017-04-03 NOTE — Progress Notes (Signed)
ANTICOAGULATION CONSULT NOTE - Follow Up Consult  Pharmacy Consult for heparin Indication: chest pain/ACS and DVT  Labs:  Recent Labs  04/02/17 0559 04/02/17 1054 04/02/17 1603 04/02/17 2226 04/03/17 0323  HGB 10.1*  --   --   --  10.8*  HCT 32.9*  --   --   --  33.4*  PLT 215  --   --   --  199  LABPROT 17.6*  --   --   --  18.2*  INR 1.43  --   --   --  1.50  HEPARINUNFRC  --   --   --   --  0.56  CREATININE 0.81  --   --   --   --   TROPONINI  --  0.08* 0.09* 0.10*  --     Assessment/Plan:  71yo female therapeutic on heparin with initial dosing for low INR. Will continue gtt at current rate and confirm stable with additional level.   Wynona Neat, PharmD, BCPS  04/03/2017,4:42 AM

## 2017-04-03 NOTE — Progress Notes (Addendum)
[facility] for Heparin, warfarin PTA and now per MD Indication: DVT/ small LV apical thrombus   Allergies  Allergen Reactions  . Sulfa Antibiotics Rash    Patient Measurements: Height: 5' 5.5" (166.4 cm) Weight: 128 lb 3.2 oz (58.2 kg) IBW/kg (Calculated) : 58.15   Vital Signs: Temp: 98.3 F (36.8 C) (06/15 0826) Temp Source: Oral (06/15 0826) BP: 116/65 (06/15 0826) Pulse Rate: 55 (06/15 0826)  Labs:  Recent Labs  04/02/17 0559 04/02/17 1054 04/02/17 1603 04/02/17 2226 04/03/17 0323 04/03/17 0925  HGB 10.1*  --   --   --  10.8*  --   HCT 32.9*  --   --   --  33.4*  --   PLT 215  --   --   --  199  --   LABPROT 17.6*  --   --   --  18.2*  --   INR 1.43  --   --   --  1.50  --   HEPARINUNFRC  --   --   --   --  0.56 0.54  CREATININE 0.81  --   --   --  0.93  --   TROPONINI  --  0.08* 0.09* 0.10*  --   --     Estimated Creatinine Clearance: 51.7 mL/min (by C-G formula based on SCr of 0.93 mg/dL).   Medical History: Past Medical History:  Diagnosis Date  . Anemia    "off and on all my life" (12/22/2016)  . Anxiety   . Bilateral renal artery stenosis ([place]) 1999   s/p stenting  . Chronic kidney disease (CKD), stage III (moderate)    Archie Endo 12/22/2016  . Coronary artery disease 1999  . Depression   . Diverticulosis   . Duodenitis   . Gastric erosions   . Gastric ulcer   . GERD (gastroesophageal reflux disease)   . Heart murmur   . History of blood transfusion ~ 03/2016   "low HgB; practically nonexistent"  . Hyperlipidemia   . Hypertension   . MI (myocardial infarction) ([place]) 1999  . Multiple myeloma ([place]) dx'd ~ 04/2016  . PAT (paroxysmal atrial tachycardia) ([place]) 12/22/2016   Archie Endo 12/22/2016  . Retinal hemorrhage, right eye   . Schatzki's ring   . Tachy-brady syndrome ([place])    Archie Endo 12/22/2016  . Tubular adenoma of colon    . sodium chloride    . heparin 800 Units/hr (04/02/17 2042)     Assessment: 71yof  admitted with CP and SOB recent DES to LAD and LCx as well as history of DVT - on ticagrelor, ASA, and warfarin PTA. Pharmacy to dose heparin drip until INR > 2. CBC stable, no bleeding noted.   Heparin level supratherapeutic at 1.10 on 800 units/hr  Notable increase in SCr as well  INR barely therapeutic at 2.05  Goal of Therapy:  INR 2-3 Heparin level 0.3-0.7 units/ml Monitor platelets by anticoagulation protocol: Yes   Plan:  MD ordered warfarin 10 mg today (PTA dose 5 mg daily) Daily INR Will f/u with MD   Hold heparin gtt x1 hour and resume heparin gtt at 650 units/hr Heparin level in 8 hours  Daily heparin level and CBC. Will d/c IV heparin once INR > 2 for two doses  ______________________________________________________  Addendum: D/C heparin gtt per MD  Warfarin per Pharmacy  Warfarin 50m PO x1 tonight  Daily INR, CBC as needed Monitor for s/s bleeding   KArgie Ramming PharmD Pharmacy  Resident  Pager [phone] 04/04/17 8:43 AM

## 2017-04-03 NOTE — Progress Notes (Signed)
Ref: Nolene Ebbs, MD   Subjective:  Breathing better post good diuresis. INR 1.5. VS stable. Hypokalemia and hypomagnesemia have resolved with supplements.  Objective:  Vital Signs in the last 24 hours: Temp:  [98.3 F (36.8 C)-98.8 F (37.1 C)] 98.3 F (36.8 C) (06/15 0826) Pulse Rate:  [55-103] 55 (06/15 0826) Cardiac Rhythm: Sinus bradycardia (06/15 0700) Resp:  [16-18] 16 (06/15 0826) BP: (92-172)/(53-117) 116/65 (06/15 0826) SpO2:  [92 %-100 %] 100 % (06/15 0826) Weight:  [58.2 kg (128 lb 3.2 oz)-60.5 kg (133 lb 6.4 oz)] 58.2 kg (128 lb 3.2 oz) (06/15 0410)  Physical Exam: BP Readings from Last 1 Encounters:  04/03/17 116/65    Wt Readings from Last 1 Encounters:  04/03/17 58.2 kg (128 lb 3.2 oz)    Weight change:  Body mass index is 21.01 kg/m. HEENT: [place]/AT, Eyes-Brown, PERL, EOMI, Conjunctiva-Pink, Sclera-Non-icteric Neck: No JVD, No bruit, Trachea midline. Lungs:  Clear, Bilateral. Cardiac:  Regular rhythm, normal S1 and S2, no S3. II/VI systolic murmur. Abdomen:  Soft, non-tender. BS present. Extremities:  No edema present. No cyanosis. No clubbing. CNS: AxOx3, Cranial nerves grossly intact, moves all 4 extremities.  Skin: Warm and dry.   Intake/Output from previous day: 06/14 0701 - 06/15 0700 In: 605.7 [P.O.:480; I.V.:19.7; IV Piggyback:106] Out: 3650 [Urine:3650]    Lab Results: BMET    Component Value Date/Time   NA 142 04/03/2017 0323   NA 142 04/02/2017 0559   NA 141 03/10/2017 0302   NA 143 02/12/2017 1035   NA 145 01/29/2017 1428   NA 140 12/18/2016 0932   K 3.6 04/03/2017 0323   K 3.2 (L) 04/02/2017 0559   K 4.4 03/10/2017 0302   K 3.8 02/12/2017 1035   K 3.7 01/29/2017 1428   K 4.9 12/18/2016 0932   CL 106 04/03/2017 0323   CL 111 04/02/2017 0559   CL 115 (H) 03/10/2017 0302   CO2 24 04/03/2017 0323   CO2 21 (L) 04/02/2017 0559   CO2 20 (L) 03/10/2017 0302   CO2 23 02/12/2017 1035   CO2 25 01/29/2017 1428   CO2 24 12/18/2016  0932   GLUCOSE 95 04/03/2017 0323   GLUCOSE 98 04/02/2017 0559   GLUCOSE 84 03/10/2017 0302   GLUCOSE 106 02/12/2017 1035   GLUCOSE 127 01/29/2017 1428   GLUCOSE 138 12/18/2016 0932   BUN 14 04/03/2017 0323   BUN 14 04/02/2017 0559   BUN 5 (L) 03/10/2017 0302   BUN 20.6 02/12/2017 1035   BUN 19.1 01/29/2017 1428   BUN 16.8 12/18/2016 0932   CREATININE 0.93 04/03/2017 0323   CREATININE 0.81 04/02/2017 0559   CREATININE 0.81 03/10/2017 0302   CREATININE 1.0 02/12/2017 1035   CREATININE 0.9 01/29/2017 1428   CREATININE 1.7 (H) 12/18/2016 0932   CALCIUM 9.3 04/03/2017 0323   CALCIUM 8.9 04/02/2017 0559   CALCIUM 8.5 (L) 03/10/2017 0302   CALCIUM 9.3 02/12/2017 1035   CALCIUM 9.2 01/29/2017 1428   CALCIUM 9.9 12/18/2016 0932   GFRNONAA >60 04/03/2017 0323   GFRNONAA >60 04/02/2017 0559   GFRNONAA >60 03/10/2017 0302   GFRAA >60 04/03/2017 0323   GFRAA >60 04/02/2017 0559   GFRAA >60 03/10/2017 0302   CBC    Component Value Date/Time   WBC 3.6 (L) 04/03/2017 0323   RBC 3.60 (L) 04/03/2017 0323   HGB 10.8 (L) 04/03/2017 0323   HGB 10.1 (L) 02/12/2017 1035   HCT 33.4 (L) 04/03/2017 0323   HCT 31.9 (L)  02/12/2017 1035   PLT 199 04/03/2017 0323   PLT 258 02/12/2017 1035   MCV 92.8 04/03/2017 0323   MCV 95.8 02/12/2017 1035   MCH 30.0 04/03/2017 0323   MCHC 32.3 04/03/2017 0323   RDW 16.9 (H) 04/03/2017 0323   RDW 15.3 (H) 02/12/2017 1035   LYMPHSABS 0.8 03/04/2017 0612   LYMPHSABS 1.0 02/12/2017 1035   MONOABS 1.5 (H) 03/04/2017 0612   MONOABS 0.9 02/12/2017 1035   EOSABS 0.1 03/04/2017 0612   EOSABS 0.1 02/12/2017 1035   BASOSABS 0.0 03/04/2017 0612   BASOSABS 0.0 02/12/2017 1035   HEPATIC Function Panel  Recent Labs  02/27/17 0230 02/28/17 0550 03/10/17 0302  PROT 4.8* 4.3* 4.7*   HEMOGLOBIN A1C No components found for: HGA1C,  MPG CARDIAC ENZYMES Lab Results  Component Value Date   CKTOTAL 129 01/01/2012   CKMB 4.6 (H) 01/01/2012   TROPONINI 0.10  (HH) 04/02/2017   TROPONINI 0.09 (HH) 04/02/2017   TROPONINI 0.08 (HH) 04/02/2017   BNP No results for input(s): PROBNP in the last 8760 hours. TSH  Recent Labs  12/06/16 2005 12/18/16 1056 12/22/16 2008  TSH 0.541 0.576 0.481   CHOLESTEROL  Recent Labs  12/07/16 0605 02/24/17 2028 03/05/17 0304  CHOL 220* 196 174    Scheduled Meds: . acyclovir  400 mg Oral BID  . amiodarone  100 mg Oral Daily  . aspirin EC  81 mg Oral Daily  . atorvastatin  80 mg Oral q1800  . calcium carbonate  1 tablet Oral Q breakfast  . carvedilol  3.125 mg Oral BID WC  . cholecalciferol  1,000 Units Oral Daily  . feeding supplement (ENSURE ENLIVE)  237 mL Oral BID BM  . ferrous sulfate  325 mg Oral Q breakfast  . [START ON 04/04/2017] furosemide  40 mg Intravenous Daily  . losartan  25 mg Oral Daily  . mirtazapine  30 mg Oral QHS  . pantoprazole  40 mg Oral Daily  . potassium chloride  40 mEq Oral Daily  . pyridostigmine  30 mg Oral BID  . sodium chloride flush  3 mL Intravenous Q12H  . ticagrelor  90 mg Oral BID  . vitamin C  1,000 mg Oral Daily  . warfarin  10 mg Oral q1800  . Warfarin - Physician Dosing Inpatient   Does not apply q1800   Continuous Infusions: . sodium chloride    . heparin 800 Units/hr (04/02/17 2042)   PRN Meds:.sodium chloride, acetaminophen, albuterol, ondansetron (ZOFRAN) IV, polyethylene glycol, sodium chloride flush, traMADol  Assessment/Plan: Acute left heart systolic failure Abnormal troponin-I most likely demand ischemia S/P left leg DVT S/P stent in LAD and LCX Multiple myeloma Anemia of blood loss Hypokalemia and hypomagnesemia-resolved Small LV apical thrombus  Increase warfarin dose Continue IV heparin Increase activity.   LOS: 0 days    [name]  MD  04/03/2017, 10:30 AM

## 2017-04-04 DIAGNOSIS — Z961 Presence of intraocular lens: Secondary | ICD-10-CM | POA: Diagnosis present

## 2017-04-04 DIAGNOSIS — J9811 Atelectasis: Secondary | ICD-10-CM | POA: Diagnosis present

## 2017-04-04 DIAGNOSIS — Z79899 Other long term (current) drug therapy: Secondary | ICD-10-CM | POA: Diagnosis not present

## 2017-04-04 DIAGNOSIS — E876 Hypokalemia: Secondary | ICD-10-CM | POA: Diagnosis present

## 2017-04-04 DIAGNOSIS — Z7982 Long term (current) use of aspirin: Secondary | ICD-10-CM | POA: Diagnosis not present

## 2017-04-04 DIAGNOSIS — N179 Acute kidney failure, unspecified: Secondary | ICD-10-CM | POA: Diagnosis present

## 2017-04-04 DIAGNOSIS — I252 Old myocardial infarction: Secondary | ICD-10-CM | POA: Diagnosis not present

## 2017-04-04 DIAGNOSIS — Z86718 Personal history of other venous thrombosis and embolism: Secondary | ICD-10-CM | POA: Diagnosis not present

## 2017-04-04 DIAGNOSIS — I5021 Acute systolic (congestive) heart failure: Secondary | ICD-10-CM | POA: Diagnosis present

## 2017-04-04 DIAGNOSIS — G8929 Other chronic pain: Secondary | ICD-10-CM | POA: Diagnosis present

## 2017-04-04 DIAGNOSIS — M67472 Ganglion, left ankle and foot: Secondary | ICD-10-CM | POA: Diagnosis present

## 2017-04-04 DIAGNOSIS — C9000 Multiple myeloma not having achieved remission: Secondary | ICD-10-CM | POA: Diagnosis present

## 2017-04-04 DIAGNOSIS — G44229 Chronic tension-type headache, not intractable: Secondary | ICD-10-CM | POA: Diagnosis present

## 2017-04-04 DIAGNOSIS — Z7901 Long term (current) use of anticoagulants: Secondary | ICD-10-CM | POA: Diagnosis not present

## 2017-04-04 DIAGNOSIS — Z955 Presence of coronary angioplasty implant and graft: Secondary | ICD-10-CM | POA: Diagnosis not present

## 2017-04-04 DIAGNOSIS — Z9841 Cataract extraction status, right eye: Secondary | ICD-10-CM | POA: Diagnosis not present

## 2017-04-04 DIAGNOSIS — Z90710 Acquired absence of both cervix and uterus: Secondary | ICD-10-CM | POA: Diagnosis not present

## 2017-04-04 DIAGNOSIS — K219 Gastro-esophageal reflux disease without esophagitis: Secondary | ICD-10-CM | POA: Diagnosis present

## 2017-04-04 DIAGNOSIS — Z87891 Personal history of nicotine dependence: Secondary | ICD-10-CM | POA: Diagnosis not present

## 2017-04-04 DIAGNOSIS — I248 Other forms of acute ischemic heart disease: Secondary | ICD-10-CM | POA: Diagnosis present

## 2017-04-04 DIAGNOSIS — D500 Iron deficiency anemia secondary to blood loss (chronic): Secondary | ICD-10-CM | POA: Diagnosis present

## 2017-04-04 DIAGNOSIS — N183 Chronic kidney disease, stage 3 (moderate): Secondary | ICD-10-CM | POA: Diagnosis present

## 2017-04-04 DIAGNOSIS — R0602 Shortness of breath: Secondary | ICD-10-CM | POA: Diagnosis present

## 2017-04-04 DIAGNOSIS — I130 Hypertensive heart and chronic kidney disease with heart failure and stage 1 through stage 4 chronic kidney disease, or unspecified chronic kidney disease: Secondary | ICD-10-CM | POA: Diagnosis present

## 2017-04-04 LAB — HEPARIN LEVEL (UNFRACTIONATED): HEPARIN UNFRACTIONATED: 1.1 [IU]/mL — AB (ref 0.30–0.70)

## 2017-04-04 LAB — BASIC METABOLIC PANEL
ANION GAP: 10 (ref 5–15)
BUN: 24 mg/dL — AB (ref 6–20)
CALCIUM: 8.8 mg/dL — AB (ref 8.9–10.3)
CO2: 23 mmol/L (ref 22–32)
CREATININE: 1.79 mg/dL — AB (ref 0.44–1.00)
Chloride: 107 mmol/L (ref 101–111)
GFR calc Af Amer: 32 mL/min — ABNORMAL LOW (ref >60)
GFR, EST NON AFRICAN AMERICAN: 28 mL/min — AB (ref >60)
GLUCOSE: 100 mg/dL — AB (ref 65–99)
Potassium: 4.3 mmol/L (ref 3.5–5.1)
Sodium: 140 mmol/L (ref 135–145)

## 2017-04-04 LAB — TROPONIN I: Troponin I: 0.11 ng/mL (ref <0.03)

## 2017-04-04 LAB — PROTIME-INR
INR: 2.05
Prothrombin Time: 23.5 seconds — ABNORMAL HIGH (ref 11.4–15.2)

## 2017-04-04 MED ORDER — HEPARIN (PORCINE) IN NACL 100-0.45 UNIT/ML-% IJ SOLN: 650.0000 [IU]/h | INTRAMUSCULAR | Status: DC

## 2017-04-04 MED ORDER — WARFARIN SODIUM 5 MG PO TABS
5.0000 mg | ORAL_TABLET | Freq: Once | ORAL | Status: AC
  Administered 2017-04-04: 5 mg via ORAL
  Filled 2017-04-04: qty 1

## 2017-04-04 MED ORDER — WARFARIN - PHARMACIST DOSING INPATIENT: Freq: Every day | Status: DC

## 2017-04-04 MED ORDER — WARFARIN SODIUM 5 MG PO TABS: 5.0000 mg | ORAL_TABLET | Freq: Once | ORAL | Status: DC

## 2017-04-04 NOTE — Progress Notes (Signed)
Subjective:  Patient denies any chest pain states breathing has improved. Had PCI to left circumflex/LAD more than one month ago. Patient had very high risk of repeat bleeding will switch to single antiplatelet medication plus Coumadin.  Objective:  Vital Signs in the last 24 hours: Temp:  [98.5 F (36.9 C)-98.9 F (37.2 C)] 98.5 F (36.9 C) (06/16 0623) Pulse Rate:  [60-78] 75 (06/16 0623) Resp:  [16-18] 18 (06/16 0623) BP: (90-120)/(49-68) 93/64 (06/16 0623) SpO2:  [96 %-100 %] 97 % (06/16 0623) Weight:  [57.6 kg (127 lb)] 57.6 kg (127 lb) (06/16 0623)  Intake/Output from previous day: 06/15 0701 - 06/16 0700 In: 1720.7 [P.O.:1440; I.V.:280.7] Out: 1270 [Urine:1270] Intake/Output from this shift: Total I/O In: 120 [P.O.:120] Out: -   Physical Exam: Neck: no adenopathy, no carotid bruit, no JVD and supple, symmetrical, trachea midline Lungs: clear to auscultation bilaterally Heart: regular rate and rhythm, S1, S2 normal and Soft systolic murmur noted Abdomen: soft, non-tender; bowel sounds normal; no masses,  no organomegaly Extremities: extremities normal, atraumatic, no cyanosis or edema  Lab Results:  Recent Labs  04/02/17 0559 04/03/17 0323  WBC 4.4 3.6*  HGB 10.1* 10.8*  PLT 215 199    Recent Labs  04/02/17 0559 04/03/17 0323  NA 142 142  K 3.2* 3.6  CL 111 106  CO2 21* 24  GLUCOSE 98 95  BUN 14 14  CREATININE 0.81 0.93    Recent Labs  04/02/17 1603 04/02/17 2226  TROPONINI 0.09* 0.10*   Hepatic Function Panel No results for input(s): PROT, ALBUMIN, AST, ALT, ALKPHOS, BILITOT, BILIDIR, IBILI in the last 72 hours. No results for input(s): CHOL in the last 72 hours. No results for input(s): PROTIME in the last 72 hours.  Imaging: Imaging results have been reviewed and No results found.  Cardiac Studies:  Assessment/Plan:  Compensated Acute left heart systolic failure Abnormal troponin-I most likely demand ischemia S/P left leg DVT S/P  stent in LAD and LCX Multiple myeloma Anemia of blood loss Hypokalemia and hypomagnesemia-resolved Small LV apical thrombus Plan DC aspirin Continue rest of the medication check PT/INR in a.m.  LOS: 0 days    Charolette Forward 04/04/2017, 10:35 AM

## 2017-04-04 NOTE — Progress Notes (Signed)
Critical Troponin 0.11 Called to Dr [name]. No Changes at this time.

## 2017-04-05 LAB — CBC
HEMATOCRIT: 32.5 % — AB (ref 36.0–46.0)
Hemoglobin: 9.9 g/dL — ABNORMAL LOW (ref 12.0–15.0)
MCH: 29.5 pg (ref 26.0–34.0)
MCHC: 30.5 g/dL (ref 30.0–36.0)
MCV: 96.7 fL (ref 78.0–100.0)
Platelets: 198 10*3/uL (ref 150–400)
RBC: 3.36 MIL/uL — ABNORMAL LOW (ref 3.87–5.11)
RDW: 17 % — AB (ref 11.5–15.5)
WBC: 3.9 10*3/uL — AB (ref 4.0–10.5)

## 2017-04-05 LAB — BASIC METABOLIC PANEL
ANION GAP: 9 (ref 5–15)
BUN: 32 mg/dL — ABNORMAL HIGH (ref 6–20)
CALCIUM: 8.4 mg/dL — AB (ref 8.9–10.3)
CHLORIDE: 111 mmol/L (ref 101–111)
CO2: 22 mmol/L (ref 22–32)
CREATININE: 2.32 mg/dL — AB (ref 0.44–1.00)
GFR calc non Af Amer: 20 mL/min — ABNORMAL LOW (ref >60)
GFR, EST AFRICAN AMERICAN: 23 mL/min — AB (ref >60)
Glucose, Bld: 89 mg/dL (ref 65–99)
Potassium: 4.9 mmol/L (ref 3.5–5.1)
SODIUM: 142 mmol/L (ref 135–145)

## 2017-04-05 LAB — PROTIME-INR
INR: 2.55
PROTHROMBIN TIME: 27.9 s — AB (ref 11.4–15.2)

## 2017-04-05 MED ORDER — WARFARIN SODIUM 5 MG PO TABS
5.0000 mg | ORAL_TABLET | Freq: Once | ORAL | Status: AC
  Administered 2017-04-05: 5 mg via ORAL
  Filled 2017-04-05: qty 1

## 2017-04-05 MED ORDER — SODIUM CHLORIDE 0.9 % IV BOLUS (SEPSIS)
250.0000 mL | Freq: Once | INTRAVENOUS | Status: AC
  Administered 2017-04-05: 250 mL via INTRAVENOUS

## 2017-04-05 NOTE — Progress Notes (Signed)
Page to Dr [name], who rounded for Dr Doylene Canard this am, in regard to pt's low BP and scheduled cardiac meds.   Per Dr [name], please hold the amiodarone and the coreg and give pt a bolus of NS totalling 250.

## 2017-04-05 NOTE — Progress Notes (Signed)
ANTICOAGULATION CONSULT NOTE  Pharmacy Consult for Warfarin  Indication: hx DVT/ small LV apical thrombus   Allergies  Allergen Reactions  . Sulfa Antibiotics Rash    Patient Measurements: Height: 5' 5.5" (166.4 cm) Weight: 128 lb 3.2 oz (58.2 kg) (scale a) IBW/kg (Calculated) : 58.15   Vital Signs: Temp: 98.8 F (37.1 C) (06/17 0610) Temp Source: Oral (06/17 0610) BP: 108/52 (06/17 0610) Pulse Rate: 83 (06/17 0610)  Labs:  Recent Labs  04/02/17 1603 04/02/17 2226 04/03/17 0323 04/03/17 0925 04/04/17 1057 04/05/17 0540  HGB  --   --  10.8*  --   --   --   HCT  --   --  33.4*  --   --   --   PLT  --   --  199  --   --   --   LABPROT  --   --  18.2*  --  23.5* 27.9*  INR  --   --  1.50  --  2.05 2.55  HEPARINUNFRC  --   --  0.56 0.54 1.10*  --   CREATININE  --   --  0.93  --  1.79* 2.32*  TROPONINI 0.09* 0.10*  --   --  0.11*  --     Estimated Creatinine Clearance: 20.7 mL/min (A) (by C-G formula based on SCr of 2.32 mg/dL (H)).   Medical History: Past Medical History:  Diagnosis Date  . Anemia    "off and on all my life" (12/22/2016)  . Anxiety   . Bilateral renal artery stenosis ([place]) 1999   s/p stenting  . Chronic kidney disease (CKD), stage III (moderate)    Archie Endo 12/22/2016  . Coronary artery disease 1999  . Depression   . Diverticulosis   . Duodenitis   . Gastric erosions   . Gastric ulcer   . GERD (gastroesophageal reflux disease)   . Heart murmur   . History of blood transfusion ~ 03/2016   "low HgB; practically nonexistent"  . Hyperlipidemia   . Hypertension   . MI (myocardial infarction) ([place]) 1999  . Multiple myeloma ([place]) dx'd ~ 04/2016  . PAT (paroxysmal atrial tachycardia) ([place]) 12/22/2016   Archie Endo 12/22/2016  . Retinal hemorrhage, right eye   . Schatzki's ring   . Tachy-brady syndrome ([place])    Archie Endo 12/22/2016  . Tubular adenoma of colon    . sodium chloride      Assessment: 71yof admitted with CP and SOB recent DES to LAD and LCx  as well as history of DVT and small apical thrombus. Patient on ticagrelor, ASA, and warfarin PTA. MD originally dosing warfarin, but now pharmacy consulted to dose. INR today is therapeutic at 2.55. CBC stable, no bleeding noted.   PTA dose: 5 mg daily   Goal of Therapy:  INR 2-3 Monitor platelets by anticoagulation protocol: Yes   Plan:  Warfarin 81m PO x1 tonight  Daily INR, CBC as needed Monitor for s/s bleeding   KArgie Ramming PharmD Pharmacy Resident  Pager [phone]/17/18 7:54 AM

## 2017-04-05 NOTE — Progress Notes (Signed)
Subjective:  Patient denies any chest pain or shortness of breath. INR in therapeutic range. Renal function progressively getting worst Will hold Lasix and losartan today  Objective:  Vital Signs in the last 24 hours: Temp:  [98.2 F (36.8 C)-98.8 F (37.1 C)] 98.8 F (37.1 C) (06/17 0610) Pulse Rate:  [63-83] 83 (06/17 0610) Resp:  [18] 18 (06/17 0610) BP: (97-108)/(51-65) 108/52 (06/17 0610) SpO2:  [100 %] 100 % (06/17 0610) Weight:  [58.2 kg (128 lb 3.2 oz)] 58.2 kg (128 lb 3.2 oz) (06/17 0610)  Intake/Output from previous day: 06/16 0701 - 06/17 0700 In: 360 [P.O.:360] Out: 300 [Urine:300] Intake/Output from this shift: Total I/O In: 240 [P.O.:240] Out: 300 [Urine:300]  Physical Exam: Neck: no adenopathy, no carotid bruit, no JVD and supple, symmetrical, trachea midline Lungs: clear to auscultation bilaterally Heart: regular rate and rhythm, S1, S2 normal and 2/6 systolic murmur noted no S3 gallop Abdomen: soft, non-tender; bowel sounds normal; no masses,  no organomegaly Extremities: extremities normal, atraumatic, no cyanosis or edema  Lab Results:  Recent Labs  04/03/17 0323  WBC 3.6*  HGB 10.8*  PLT 199    Recent Labs  04/04/17 1057 04/05/17 0540  NA 140 142  K 4.3 4.9  CL 107 111  CO2 23 22  GLUCOSE 100* 89  BUN 24* 32*  CREATININE 1.79* 2.32*    Recent Labs  04/02/17 2226 04/04/17 1057  TROPONINI 0.10* 0.11*   Hepatic Function Panel No results for input(s): PROT, ALBUMIN, AST, ALT, ALKPHOS, BILITOT, BILIDIR, IBILI in the last 72 hours. No results for input(s): CHOL in the last 72 hours. No results for input(s): PROTIME in the last 72 hours.  Imaging: Imaging results have been reviewed and No results found.  Cardiac Studies:  Assessment/Plan:  Compensated Acute left heart systolic failure Abnormal troponin-I most likely demand ischemia S/P left leg DVT S/P stent in LAD and LCX Multiple myeloma Anemia of blood loss Acute on chronic  renal insufficiency secondary to hypovolemia/ARB Hypokalemia and hypomagnesemia-resolved Small LV apical thrombus Plan Hold Lasix and losartan Check labs in a.m. Increase ambulation as tolerated  LOS: 1 day    Charolette Forward 04/05/2017, 10:42 AM

## 2017-04-06 LAB — BASIC METABOLIC PANEL
Anion gap: 8 (ref 5–15)
BUN: 28 mg/dL — AB (ref 6–20)
CHLORIDE: 113 mmol/L — AB (ref 101–111)
CO2: 21 mmol/L — ABNORMAL LOW (ref 22–32)
CREATININE: 1.39 mg/dL — AB (ref 0.44–1.00)
Calcium: 8.7 mg/dL — ABNORMAL LOW (ref 8.9–10.3)
GFR calc Af Amer: 43 mL/min — ABNORMAL LOW (ref >60)
GFR calc non Af Amer: 37 mL/min — ABNORMAL LOW (ref >60)
GLUCOSE: 88 mg/dL (ref 65–99)
Potassium: 4.4 mmol/L (ref 3.5–5.1)
SODIUM: 142 mmol/L (ref 135–145)

## 2017-04-06 LAB — PROTIME-INR
INR: 3.2
Prothrombin Time: 33.5 seconds — ABNORMAL HIGH (ref 11.4–15.2)

## 2017-04-06 MED ORDER — WARFARIN SODIUM 2.5 MG PO TABS
2.5000 mg | ORAL_TABLET | Freq: Once | ORAL | Status: AC
  Administered 2017-04-06: 2.5 mg via ORAL
  Filled 2017-04-06: qty 1

## 2017-04-06 NOTE — Progress Notes (Signed)
Pt educated about safety and importance of bed alarm during the night however pt refuses to be on bed alarm. Will continue to round on patient.   Aadin Gaut, RN    

## 2017-04-06 NOTE — Progress Notes (Signed)
Ref: Nolene Ebbs, MD  Subjective:  Feeling better except pain in left foot dorsal. Vital signs stable except sinus bradycardia.  Objective:  Vital Signs in the last 24 hours: Temp:  [98.1 F (36.7 C)-98.5 F (36.9 C)] 98.1 F (36.7 C) (06/18 0655) Pulse Rate:  [47-57] 47 (06/18 0655) Cardiac Rhythm: Sinus bradycardia (06/18 0700) Resp:  [18] 18 (06/18 0655) BP: (87-114)/(49-60) 102/60 (06/18 0655) SpO2:  [100 %] 100 % (06/18 0655) Weight:  [58.6 kg (129 lb 3.2 oz)] 58.6 kg (129 lb 3.2 oz) (06/18 0865)  Physical Exam: BP Readings from Last 1 Encounters:  04/06/17 102/60    Wt Readings from Last 1 Encounters:  04/06/17 58.6 kg (129 lb 3.2 oz)    Weight change: 0.454 kg (1 lb) Body mass index is 21.17 kg/m. HEENT: [place]/AT, Eyes-Brown, PERL, EOMI, Conjunctiva-Pink, Sclera-Non-icteric Neck: No JVD, No bruit, Trachea midline. Lungs:  Clear, Bilateral. Cardiac:  Regular rhythm, normal S1 and S2, no S3. II/VI systolic murmur. Abdomen:  Soft, non-tender. BS present. Extremities:  No edema present. No cyanosis. No clubbing. Small cyst over the dorsum of left foot along the extensor tendon of the fourth toe. CNS: AxOx3, Cranial nerves grossly intact, moves all 4 extremities.  Skin: Warm and dry.   Intake/Output from previous day: 06/17 0701 - 06/18 0700 In: 730 [P.O.:480; I.V.:250] Out: 1000 [Urine:1000]    Lab Results: BMET    Component Value Date/Time   NA 142 04/06/2017 0400   NA 142 04/05/2017 0540   NA 140 04/04/2017 1057   NA 143 02/12/2017 1035   NA 145 01/29/2017 1428   NA 140 12/18/2016 0932   K 4.4 04/06/2017 0400   K 4.9 04/05/2017 0540   K 4.3 04/04/2017 1057   K 3.8 02/12/2017 1035   K 3.7 01/29/2017 1428   K 4.9 12/18/2016 0932   CL 113 (H) 04/06/2017 0400   CL 111 04/05/2017 0540   CL 107 04/04/2017 1057   CO2 21 (L) 04/06/2017 0400   CO2 22 04/05/2017 0540   CO2 23 04/04/2017 1057   CO2 23 02/12/2017 1035   CO2 25 01/29/2017 1428   CO2 24  12/18/2016 0932   GLUCOSE 88 04/06/2017 0400   GLUCOSE 89 04/05/2017 0540   GLUCOSE 100 (H) 04/04/2017 1057   GLUCOSE 106 02/12/2017 1035   GLUCOSE 127 01/29/2017 1428   GLUCOSE 138 12/18/2016 0932   BUN 28 (H) 04/06/2017 0400   BUN 32 (H) 04/05/2017 0540   BUN 24 (H) 04/04/2017 1057   BUN 20.6 02/12/2017 1035   BUN 19.1 01/29/2017 1428   BUN 16.8 12/18/2016 0932   CREATININE 1.39 (H) 04/06/2017 0400   CREATININE 2.32 (H) 04/05/2017 0540   CREATININE 1.79 (H) 04/04/2017 1057   CREATININE 1.0 02/12/2017 1035   CREATININE 0.9 01/29/2017 1428   CREATININE 1.7 (H) 12/18/2016 0932   CALCIUM 8.7 (L) 04/06/2017 0400   CALCIUM 8.4 (L) 04/05/2017 0540   CALCIUM 8.8 (L) 04/04/2017 1057   CALCIUM 9.3 02/12/2017 1035   CALCIUM 9.2 01/29/2017 1428   CALCIUM 9.9 12/18/2016 0932   GFRNONAA 37 (L) 04/06/2017 0400   GFRNONAA 20 (L) 04/05/2017 0540   GFRNONAA 28 (L) 04/04/2017 1057   GFRAA 43 (L) 04/06/2017 0400   GFRAA 23 (L) 04/05/2017 0540   GFRAA 32 (L) 04/04/2017 1057   CBC    Component Value Date/Time   WBC 3.9 (L) 04/05/2017 1013   RBC 3.36 (L) 04/05/2017 1013   HGB 9.9 (L) 04/05/2017  1013   HGB 10.1 (L) 02/12/2017 1035   HCT 32.5 (L) 04/05/2017 1013   HCT 31.9 (L) 02/12/2017 1035   PLT 198 04/05/2017 1013   PLT 258 02/12/2017 1035   MCV 96.7 04/05/2017 1013   MCV 95.8 02/12/2017 1035   MCH 29.5 04/05/2017 1013   MCHC 30.5 04/05/2017 1013   RDW 17.0 (H) 04/05/2017 1013   RDW 15.3 (H) 02/12/2017 1035   LYMPHSABS 0.8 03/04/2017 0612   LYMPHSABS 1.0 02/12/2017 1035   MONOABS 1.5 (H) 03/04/2017 0612   MONOABS 0.9 02/12/2017 1035   EOSABS 0.1 03/04/2017 0612   EOSABS 0.1 02/12/2017 1035   BASOSABS 0.0 03/04/2017 0612   BASOSABS 0.0 02/12/2017 1035   HEPATIC Function Panel  Recent Labs  02/27/17 0230 02/28/17 0550 03/10/17 0302  PROT 4.8* 4.3* 4.7*   HEMOGLOBIN A1C No components found for: HGA1C,  MPG CARDIAC ENZYMES Lab Results  Component Value Date   CKTOTAL  129 01/01/2012   CKMB 4.6 (H) 01/01/2012   TROPONINI 0.11 (HH) 04/04/2017   TROPONINI 0.10 (HH) 04/02/2017   TROPONINI 0.09 (HH) 04/02/2017   BNP No results for input(s): PROBNP in the last 8760 hours. TSH  Recent Labs  12/06/16 2005 12/18/16 1056 12/22/16 2008  TSH 0.541 0.576 0.481   CHOLESTEROL  Recent Labs  12/07/16 0605 02/24/17 2028 03/05/17 0304  CHOL 220* 196 174    Scheduled Meds: . acyclovir  400 mg Oral BID  . amiodarone  100 mg Oral Daily  . atorvastatin  80 mg Oral q1800  . calcium carbonate  1 tablet Oral Q breakfast  . cholecalciferol  1,000 Units Oral Daily  . feeding supplement (ENSURE ENLIVE)  237 mL Oral BID BM  . ferrous sulfate  325 mg Oral Q breakfast  . mirtazapine  30 mg Oral QHS  . pantoprazole  40 mg Oral Daily  . pyridostigmine  30 mg Oral BID  . sodium chloride flush  3 mL Intravenous Q12H  . ticagrelor  90 mg Oral BID  . vitamin C  1,000 mg Oral Daily  . warfarin  2.5 mg Oral ONCE-1800  . Warfarin - Pharmacist Dosing Inpatient   Does not apply q1800   Continuous Infusions: . sodium chloride     PRN Meds:.sodium chloride, acetaminophen, albuterol, ondansetron (ZOFRAN) IV, polyethylene glycol, sodium chloride flush, traMADol  Assessment/Plan: Acute left heart systolic failure Abnormal troponin I most likely demand ischemia Status post left leg DVT Status post stent in LAD and LCx Multiple myeloma Anemia of blood loss Hypokalemia and hypomagnesemia resolved Small left ventricular apical thrombus Very small left foot cyst  Increase activity and discharge home when INR is therapeutic.   LOS: 2 days    [name]  MD  04/06/2017, 9:48 AM

## 2017-04-06 NOTE — Progress Notes (Signed)
ANTICOAGULATION CONSULT NOTE  Pharmacy Consult for Warfarin  Indication: hx DVT/ small LV apical thrombus   Allergies  Allergen Reactions  . Sulfa Antibiotics Rash    Patient Measurements: Height: 5' 5.5" (166.4 cm) Weight: 129 lb 3.2 oz (58.6 kg) (scale a) IBW/kg (Calculated) : 58.15   Vital Signs: Temp: 98.1 F (36.7 C) (06/18 0655) Temp Source: Oral (06/18 0655) BP: 102/60 (06/18 0655) Pulse Rate: 47 (06/18 0655)  Labs:  Recent Labs  04/03/17 0925 04/04/17 1057 04/05/17 0540 04/05/17 1013 04/06/17 0400  HGB  --   --   --  9.9*  --   HCT  --   --   --  32.5*  --   PLT  --   --   --  198  --   LABPROT  --  23.5* 27.9*  --  33.5*  INR  --  2.05 2.55  --  3.20  HEPARINUNFRC 0.54 1.10*  --   --   --   CREATININE  --  1.79* 2.32*  --  1.39*  TROPONINI  --  0.11*  --   --   --     Estimated Creatinine Clearance: 34.6 mL/min (A) (by C-G formula based on SCr of 1.39 mg/dL (H)).   Medical History: Past Medical History:  Diagnosis Date  . Anemia    "off and on all my life" (12/22/2016)  . Anxiety   . Bilateral renal artery stenosis ([place]) 1999   s/p stenting  . Chronic kidney disease (CKD), stage III (moderate)    Archie Endo 12/22/2016  . Coronary artery disease 1999  . Depression   . Diverticulosis   . Duodenitis   . Gastric erosions   . Gastric ulcer   . GERD (gastroesophageal reflux disease)   . Heart murmur   . History of blood transfusion ~ 03/2016   "low HgB; practically nonexistent"  . Hyperlipidemia   . Hypertension   . MI (myocardial infarction) ([place]) 1999  . Multiple myeloma ([place]) dx'd ~ 04/2016  . PAT (paroxysmal atrial tachycardia) ([place]) 12/22/2016   Archie Endo 12/22/2016  . Retinal hemorrhage, right eye   . Schatzki's ring   . Tachy-brady syndrome ([place])    Archie Endo 12/22/2016  . Tubular adenoma of colon    . sodium chloride      Assessment: 71yof admitted with CP and SOB recent DES to LAD and LCx as well as history of DVT and small apical thrombus.  Patient on ticagrelor, ASA, and warfarin PTA. MD originally dosing warfarin, but now pharmacy consulted to dose.   INR is now slightly supratherapeutic at 3.2 after continuing home regimen of 57m/d. Will give half dose today just so that it doesn't fall too low once stabilized.  PTA dose: 5 mg daily   Goal of Therapy:  INR 2-3 Monitor platelets by anticoagulation protocol: Yes   Plan:  Warfarin 2.532mtonight x1  Daily INR, CBC as needed Monitor for s/s bleeding   KaArgie RammingPharmD Pharmacy Resident  Pager [phone]/18/18 8:31 AM

## 2017-04-07 LAB — PROTIME-INR
INR: 3.15
PROTHROMBIN TIME: 33 s — AB (ref 11.4–15.2)

## 2017-04-07 MED ORDER — WARFARIN SODIUM 2.5 MG PO TABS
2.5000 mg | ORAL_TABLET | Freq: Once | ORAL | Status: AC
  Administered 2017-04-07: 2.5 mg via ORAL
  Filled 2017-04-07: qty 1

## 2017-04-07 NOTE — Progress Notes (Signed)
Patient with no complaints or concerns during 7pm - 7am shift.  Chaniece Barbato, RN 

## 2017-04-07 NOTE — Progress Notes (Addendum)
ANTICOAGULATION CONSULT NOTE - Follow Up Consult  Pharmacy Consult for Coumadin Indication: hx DVT/  small LV apical thrombus  Allergies  Allergen Reactions  . Sulfa Antibiotics Rash    Patient Measurements: Height: 5' 5.5" (166.4 cm) Weight: 128 lb 11.2 oz (58.4 kg) (scale b) IBW/kg (Calculated) : 58.15  Vital Signs: Temp: 97.8 F (36.6 C) (06/19 1207) Temp Source: Oral (06/19 1207) BP: 110/62 (06/19 1207) Pulse Rate: 60 (06/19 1207)  Labs:  Recent Labs  04/05/17 0540 04/05/17 1013 04/06/17 0400 04/07/17 0238  HGB  --  9.9*  --   --   HCT  --  32.5*  --   --   PLT  --  198  --   --   LABPROT 27.9*  --  33.5* 33.0*  INR 2.55  --  3.20 3.15  CREATININE 2.32*  --  1.39*  --     Estimated Creatinine Clearance: 34.6 mL/min (A) (by C-G formula based on SCr of 1.39 mg/dL (H)).  Assessment:  71yof admitted with CP and SOB recent DES to LAD and LCx as well as history of DVT and small apical thrombus. Patient on ticagrelor, ASA, and warfarin PTA. MD originally dosing warfarin, but now pharmacy consulted to dose on 6/16.  ASA stopped on 6/16.    INR was subtherapeutic (1.43) on admit 6/14, and received Coumadin 7.5 mg then 10 mg doses then 5 mg x 2 days.  INR up to 3.20 on 6/18 and 2.5 mg given; INR remains slightly therapeutic (3.15).  Not holding doses to try to avoid INR falling subtherapeutic again.    Home Coumadin regimen:  5 mg daily.  Goal of Therapy:  INR 2-3 Monitor platelets by anticoagulation protocol: Yes   Plan:   Coumadin 2.5 mg again today.   Daily PT/INR.  Arty Baumgartner, Remy Pager: [phone] 04/07/2017,5:10 PM

## 2017-04-07 NOTE — Progress Notes (Signed)
Ref: Nolene Ebbs, MD   Subjective:  Complains of headache otherwise vital signs are stable and no chest pain.  Objective:  Vital Signs in the last 24 hours: Temp:  [97.8 F (36.6 C)-98.8 F (37.1 C)] 98.8 F (37.1 C) (06/19 2112) Pulse Rate:  [45-60] 55 (06/19 2112) Cardiac Rhythm: Sinus bradycardia (06/19 1900) Resp:  [17-18] 17 (06/19 2112) BP: (105-120)/(51-62) 120/60 (06/19 2112) SpO2:  [98 %-100 %] 98 % (06/19 2112) Weight:  [58.4 kg (128 lb 11.2 oz)] 58.4 kg (128 lb 11.2 oz) (06/19 0543)  Physical Exam: BP Readings from Last 1 Encounters:  04/07/17 120/60    Wt Readings from Last 1 Encounters:  04/07/17 58.4 kg (128 lb 11.2 oz)    Weight change: -0.227 kg (-8 oz) Body mass index is 21.09 kg/m. HEENT: [place]/AT, Eyes-Brown, PERL, EOMI, Conjunctiva-Pink, Sclera-Non-icteric Neck: No JVD, No bruit, Trachea midline. Lungs:  Clear, Bilateral. Cardiac:  Regular rhythm, normal S1 and S2, no S3. II/VI systolic murmur. Abdomen:  Soft, non-tender. BS present. Extremities:  No edema present. No cyanosis. No clubbing. Left foot small cyst as before. CNS: AxOx3, Cranial nerves grossly intact, moves all 4 extremities.  Skin: Warm and dry.   Intake/Output from previous day: 06/18 0701 - 06/19 0700 In: 360 [P.O.:360] Out: 1400 [Urine:1400]    Lab Results: BMET    Component Value Date/Time   NA 142 04/06/2017 0400   NA 142 04/05/2017 0540   NA 140 04/04/2017 1057   NA 143 02/12/2017 1035   NA 145 01/29/2017 1428   NA 140 12/18/2016 0932   K 4.4 04/06/2017 0400   K 4.9 04/05/2017 0540   K 4.3 04/04/2017 1057   K 3.8 02/12/2017 1035   K 3.7 01/29/2017 1428   K 4.9 12/18/2016 0932   CL 113 (H) 04/06/2017 0400   CL 111 04/05/2017 0540   CL 107 04/04/2017 1057   CO2 21 (L) 04/06/2017 0400   CO2 22 04/05/2017 0540   CO2 23 04/04/2017 1057   CO2 23 02/12/2017 1035   CO2 25 01/29/2017 1428   CO2 24 12/18/2016 0932   GLUCOSE 88 04/06/2017 0400   GLUCOSE 89 04/05/2017  0540   GLUCOSE 100 (H) 04/04/2017 1057   GLUCOSE 106 02/12/2017 1035   GLUCOSE 127 01/29/2017 1428   GLUCOSE 138 12/18/2016 0932   BUN 28 (H) 04/06/2017 0400   BUN 32 (H) 04/05/2017 0540   BUN 24 (H) 04/04/2017 1057   BUN 20.6 02/12/2017 1035   BUN 19.1 01/29/2017 1428   BUN 16.8 12/18/2016 0932   CREATININE 1.39 (H) 04/06/2017 0400   CREATININE 2.32 (H) 04/05/2017 0540   CREATININE 1.79 (H) 04/04/2017 1057   CREATININE 1.0 02/12/2017 1035   CREATININE 0.9 01/29/2017 1428   CREATININE 1.7 (H) 12/18/2016 0932   CALCIUM 8.7 (L) 04/06/2017 0400   CALCIUM 8.4 (L) 04/05/2017 0540   CALCIUM 8.8 (L) 04/04/2017 1057   CALCIUM 9.3 02/12/2017 1035   CALCIUM 9.2 01/29/2017 1428   CALCIUM 9.9 12/18/2016 0932   GFRNONAA 37 (L) 04/06/2017 0400   GFRNONAA 20 (L) 04/05/2017 0540   GFRNONAA 28 (L) 04/04/2017 1057   GFRAA 43 (L) 04/06/2017 0400   GFRAA 23 (L) 04/05/2017 0540   GFRAA 32 (L) 04/04/2017 1057   CBC    Component Value Date/Time   WBC 3.9 (L) 04/05/2017 1013   RBC 3.36 (L) 04/05/2017 1013   HGB 9.9 (L) 04/05/2017 1013   HGB 10.1 (L) 02/12/2017 1035   HCT  32.5 (L) 04/05/2017 1013   HCT 31.9 (L) 02/12/2017 1035   PLT 198 04/05/2017 1013   PLT 258 02/12/2017 1035   MCV 96.7 04/05/2017 1013   MCV 95.8 02/12/2017 1035   MCH 29.5 04/05/2017 1013   MCHC 30.5 04/05/2017 1013   RDW 17.0 (H) 04/05/2017 1013   RDW 15.3 (H) 02/12/2017 1035   LYMPHSABS 0.8 03/04/2017 0612   LYMPHSABS 1.0 02/12/2017 1035   MONOABS 1.5 (H) 03/04/2017 0612   MONOABS 0.9 02/12/2017 1035   EOSABS 0.1 03/04/2017 0612   EOSABS 0.1 02/12/2017 1035   BASOSABS 0.0 03/04/2017 0612   BASOSABS 0.0 02/12/2017 1035   HEPATIC Function Panel  Recent Labs  02/27/17 0230 02/28/17 0550 03/10/17 0302  PROT 4.8* 4.3* 4.7*   HEMOGLOBIN A1C No components found for: HGA1C,  MPG CARDIAC ENZYMES Lab Results  Component Value Date   CKTOTAL 129 01/01/2012   CKMB 4.6 (H) 01/01/2012   TROPONINI 0.11 (HH)  04/04/2017   TROPONINI 0.10 (HH) 04/02/2017   TROPONINI 0.09 (HH) 04/02/2017   BNP No results for input(s): PROBNP in the last 8760 hours. TSH  Recent Labs  12/06/16 2005 12/18/16 1056 12/22/16 2008  TSH 0.541 0.576 0.481   CHOLESTEROL  Recent Labs  12/07/16 0605 02/24/17 2028 03/05/17 0304  CHOL 220* 196 174    Scheduled Meds: . acyclovir  400 mg Oral BID  . amiodarone  100 mg Oral Daily  . atorvastatin  80 mg Oral q1800  . calcium carbonate  1 tablet Oral Q breakfast  . cholecalciferol  1,000 Units Oral Daily  . feeding supplement (ENSURE ENLIVE)  237 mL Oral BID BM  . ferrous sulfate  325 mg Oral Q breakfast  . mirtazapine  30 mg Oral QHS  . pantoprazole  40 mg Oral Daily  . pyridostigmine  30 mg Oral BID  . sodium chloride flush  3 mL Intravenous Q12H  . ticagrelor  90 mg Oral BID  . vitamin C  1,000 mg Oral Daily  . Warfarin - Pharmacist Dosing Inpatient   Does not apply q1800   Continuous Infusions: . sodium chloride     PRN Meds:.sodium chloride, acetaminophen, albuterol, ondansetron (ZOFRAN) IV, polyethylene glycol, sodium chloride flush, traMADol  Assessment/Plan: Acute left heart systolic failure Abnormal troponin I from demand ischemia Status post left leg DVT Status post stent in LAD and LCx Multiple myeloma Anemia of blood loss Small left ventricular apical thrombus Small left foot ganglion cyst Headache  Use analgesic as needed. Increase activity. Home soon.   LOS: 3 days    [name]  MD  04/07/2017, 10:08 PM

## 2017-04-08 LAB — PROTIME-INR
INR: 2.85
Prothrombin Time: 30.5 s — ABNORMAL HIGH (ref 11.4–15.2)

## 2017-04-08 MED ORDER — WARFARIN SODIUM 5 MG PO TABS: 5.0000 mg | ORAL_TABLET | Freq: Every day | ORAL | Status: DC

## 2017-04-08 NOTE — Discharge Summary (Signed)
Physician Discharge Summary  Patient ID: Abigail [name] MRN: [id number] DOB/AGE: August 04, 1946 71 y.o.  Admit date: 04/02/2017 Discharge date: 04/08/2017  Admission Diagnoses: Acute left heart systolic failure Chest pain r/o MI S/P DVT S/P stent in LAD and LCx Multiple myeloma Anemia of blood loss Hypokalemia Small apical thrombus  Discharge Diagnoses:  Principle problem: * Acute left heart systolic failure * Active Problems:   Shortness of breath   Abnormal troponin-I from demand ischemia   S/P left leg DVT   S/P stents in LAD and LCx   Multiple myelpma   Anemia of blood loss   Small left ventriclar apical thrombus   Small left foot ganglion cyst   Chronic tension headache  Discharged Condition: fair  Hospital Course: 71 year old female with recent antero-lateral wall MI and stents in LAD and LCx had recurrent chest pain with shortness of breath and elevated BNP. She also had DVT but her INR was subtherapeutic. She improved with IV lasix and adjusting INR with IV heparin bridging. Her hypokalemia and hypomagnesemia improved with supplementation. She has developed small ganglion cyst on left foot and has recurrent tension headaches. She ambulated well and was discharged home in stable condition with therapeutic INR. Her Beta blocker was discontinued for sinus bradycardia. She will see me in 1 week for INR and Primary care in 1 month.  Consults: cardiology  Significant Diagnostic Studies: labs: Near normal CBC with improving hemoglobin to 10.1. Normal BMET except low potassium of 3.2 and magnesium level of 1.3. BNP was elevated at 1504.6. Troponin-I was minimally elevated at 0.08 to 0.11. INR was 1.43 on admission and 2.85 on day of discharge.  Treatments: cardiac meds: Ticagrelor, atorvastatin, amiodarone and warfarin 5 mg. M-W-F and 2.5 mg. On other days.  Discharge Exam: Blood pressure 116/63, pulse 60, temperature 98.5 F (36.9 C), temperature source Oral, resp. rate 17, height 5'  5.5" (1.664 m), weight 58.2 kg (128 lb 4.8 oz), SpO2 100 %. General appearance: alert, cooperative and appears stated age. Head: Normocephalic, atraumatic. Eyes: Brown eyes, pink conjunctiva, corneas clear. PERRL, EOM's intact.  Neck: No adenopathy, no carotid bruit, no JVD, supple, symmetrical, trachea midline and thyroid not enlarged. Resp: Clear to auscultation bilaterally. Cardio: Regular rate and rhythm, S1, S2 normal, II/VI systolic murmur, no click, rub or gallop. GI: Soft, non-tender; bowel sounds normal; no organomegaly. Extremities: No edema, cyanosis or clubbing. Small ganglion cyst on left foot. Skin: Warm and dry.  Neurologic: Alert and oriented X 3, normal strength and tone. Normal coordination and gait.  Disposition: 06-Home-Health Care Svc   Allergies as of 04/08/2017      Reactions   Sulfa Antibiotics Rash      Medication List    STOP taking these medications   aspirin EC 81 MG tablet   potassium chloride 10 MEQ tablet Commonly known as:  K-DUR,KLOR-CON     TAKE these medications   acyclovir 400 MG tablet Commonly known as:  ZOVIRAX TAKE 1 TABLET (400 MG TOTAL) BY MOUTH 2 (TWO) TIMES DAILY.   amiodarone 200 MG tablet Commonly known as:  PACERONE Take 0.5 tablets (100 mg total) by mouth daily.   atorvastatin 80 MG tablet Commonly known as:  LIPITOR Take 1 tablet (80 mg total) by mouth daily at 6 PM.   calcium carbonate 1250 (500 Ca) MG tablet Commonly known as:  OS-CAL - dosed in mg of elemental calcium Take 1 tablet by mouth daily with breakfast.   feeding supplement (ENSURE ENLIVE) Liqd  Take 237 mLs by mouth 2 (two) times daily between meals. What changed:  when to take this  additional instructions   ferrous sulfate 325 (65 FE) MG tablet Take 1 tablet (325 mg total) by mouth daily with breakfast.   mirtazapine 15 MG tablet Commonly known as:  REMERON Take 1 tablet (15 mg total) by mouth at bedtime. What changed:  how much to take    nitroGLYCERIN 0.4 MG SL tablet Commonly known as:  NITROSTAT Place 0.4 mg under the tongue every 5 (five) minutes as needed for chest pain.   OYSCO 500 500 MG Tabs Generic drug:  [place] Calcium Take 500 mg by mouth daily.   pantoprazole 40 MG tablet Commonly known as:  PROTONIX Take 1 tablet (40 mg total) by mouth daily.   polyethylene glycol packet Commonly known as:  MIRALAX / GLYCOLAX Take 17 g by mouth daily as needed for moderate constipation.   pyridostigmine 60 MG tablet Commonly known as:  MESTINON Take 0.5 tablets (30 mg total) by mouth 2 (two) times daily.   ticagrelor 90 MG Tabs tablet Commonly known as:  BRILINTA Take 1 tablet (90 mg total) by mouth 2 (two) times daily.   traMADol 50 MG tablet Commonly known as:  ULTRAM Take 50 mg by mouth at bedtime as needed for pain.   vitamin C 1000 MG tablet Take 1,000 mg by mouth daily.   VITAMIN D PO Take 1 capsule by mouth daily.   warfarin 5 MG tablet Commonly known as:  COUMADIN Take 1 tablet (5 mg total) by mouth daily at 6 PM. 5 mg. M-W-F and 2.5 mg. On T-Th-Sat-Sun. What changed:  additional instructions      Follow-up Information    [facility].   Why:  They will continue to do your home health care at your home Contact information: [address] [place] [address] [phone]        Nolene Ebbs, MD. Schedule an appointment as soon as possible for a visit in 1 month(s).   Specialty:  Internal Medicine Contact information: [place] [address] [phone]        [name], MD. Schedule an appointment as soon as possible for a visit in 1 week(s).   Specialty:  Cardiology Contact information: [place] [address] [phone]           Signed: Birdie Riddle 04/08/2017, 9:47 AM

## 2017-04-08 NOTE — Care Management Important Message (Signed)
Important Message  Patient Details  Name: Abigail [name] MRN: [id number] Date of Birth: Jul 18, 1946   Medicare Important Message Given:  Yes    Marquesha Robideau Montine Circle 04/08/2017, 11:31 AM

## 2017-04-09 ENCOUNTER — Telehealth: Payer: Self-pay | Admitting: Oncology

## 2017-04-09 ENCOUNTER — Ambulatory Visit (HOSPITAL_BASED_OUTPATIENT_CLINIC_OR_DEPARTMENT_OTHER): Payer: Medicare HMO | Admitting: Nurse Practitioner

## 2017-04-09 ENCOUNTER — Other Ambulatory Visit (HOSPITAL_BASED_OUTPATIENT_CLINIC_OR_DEPARTMENT_OTHER): Payer: Medicare HMO

## 2017-04-09 VITALS — BP 134/71 | HR 72 | Temp 99.3°F | Resp 18 | Ht 65.5 in | Wt 132.1 lb

## 2017-04-09 DIAGNOSIS — Z7901 Long term (current) use of anticoagulants: Secondary | ICD-10-CM | POA: Diagnosis not present

## 2017-04-09 DIAGNOSIS — C9001 Multiple myeloma in remission: Secondary | ICD-10-CM | POA: Diagnosis not present

## 2017-04-09 DIAGNOSIS — C9000 Multiple myeloma not having achieved remission: Secondary | ICD-10-CM

## 2017-04-09 DIAGNOSIS — D638 Anemia in other chronic diseases classified elsewhere: Secondary | ICD-10-CM | POA: Diagnosis not present

## 2017-04-09 LAB — CBC WITH DIFFERENTIAL/PLATELET
BASO%: 0.5 % (ref 0.0–2.0)
BASOS ABS: 0 10*3/uL (ref 0.0–0.1)
EOS%: 3.3 % (ref 0.0–7.0)
Eosinophils Absolute: 0.1 10*3/uL (ref 0.0–0.5)
HCT: 36.6 % (ref 34.8–46.6)
HEMOGLOBIN: 11.3 g/dL — AB (ref 11.6–15.9)
LYMPH%: 38.7 % (ref 14.0–49.7)
MCH: 29.3 pg (ref 25.1–34.0)
MCHC: 30.9 g/dL — ABNORMAL LOW (ref 31.5–36.0)
MCV: 94.8 fL (ref 79.5–101.0)
MONO#: 0.4 10*3/uL (ref 0.1–0.9)
MONO%: 10.2 % (ref 0.0–14.0)
NEUT%: 47.3 % (ref 38.4–76.8)
NEUTROS ABS: 1.9 10*3/uL (ref 1.5–6.5)
Platelets: 224 10*3/uL (ref 145–400)
RBC: 3.86 10*6/uL (ref 3.70–5.45)
RDW: 16.2 % — AB (ref 11.2–14.5)
WBC: 3.9 10*3/uL (ref 3.9–10.3)
lymph#: 1.5 10*3/uL (ref 0.9–3.3)

## 2017-04-09 LAB — COMPREHENSIVE METABOLIC PANEL
ALBUMIN: 4.1 g/dL (ref 3.5–5.0)
ALK PHOS: 118 U/L (ref 40–150)
ALT: 30 U/L (ref 0–55)
AST: 26 U/L (ref 5–34)
Anion Gap: 10 mEq/L (ref 3–11)
BUN: 23.4 mg/dL (ref 7.0–26.0)
CO2: 22 meq/L (ref 22–29)
Calcium: 10.2 mg/dL (ref 8.4–10.4)
Chloride: 109 mEq/L (ref 98–109)
Creatinine: 1 mg/dL (ref 0.6–1.1)
EGFR: 64 mL/min/{1.73_m2} — ABNORMAL LOW (ref >90)
GLUCOSE: 90 mg/dL (ref 70–140)
POTASSIUM: 4.3 meq/L (ref 3.5–5.1)
SODIUM: 141 meq/L (ref 136–145)
Total Bilirubin: 0.5 mg/dL (ref 0.20–1.20)
Total Protein: 7.3 g/dL (ref 6.4–8.3)

## 2017-04-09 NOTE — Telephone Encounter (Signed)
Appointments scheduled per 04/09/17 los. °Patient was given a copy of the AVS report and appointment schedule, per 04/09/17 los. °

## 2017-04-09 NOTE — Progress Notes (Signed)
  [facility] OFFICE PROGRESS NOTE   Diagnosis:  Multiple myeloma  INTERVAL HISTORY:   Abigail [name] returns as scheduled. Treatment for myeloma remains on hold. She was hospitalized 04/02/2017 through 04/08/2017 with recurrent chest pain and shortness of breath. BNP was elevated. She improved with diuresis.  She feels well. She denies shortness of breath and chest pain. No bleeding. She has a good appetite. She denies pain.  Objective:  Vital signs in last 24 hours:  Blood pressure 134/71, pulse 72, temperature 99.3 F (37.4 C), temperature source Oral, resp. rate 18, height 5' 5.5" (1.664 m), weight 132 lb 1.6 oz (59.9 kg), SpO2 100 %.    HEENT: No thrush or ulcers. Resp: Lungs clear bilaterally. Cardio: Regular rate and rhythm. GI: Abdomen soft and nontender. No hepatomegaly. Vascular: No leg edema. Neuro: Alert and oriented.     Lab Results:  Lab Results  Component Value Date   WBC 3.9 04/09/2017   HGB 11.3 (L) 04/09/2017   HCT 36.6 04/09/2017   MCV 94.8 04/09/2017   PLT 224 04/09/2017   NEUTROABS 1.9 04/09/2017    Imaging:  No results found.  Medications: I have reviewed the patient's current medications.  Assessment/Plan: 1. Multiple myeloma, IgG lambda, bone marrow biopsy 06/15/2016 confirmed multiple myeloma; cytogenetics by FISHshow +11, +12, 13q-  Elevated serum free lambda light chains  Lambda light chain proteinuria  Lytic bone lesions on a bone survey 06/13/2016  Initiation of weekly Velcade/Decadron 06/19/2016  Serum light chains improved 07/31/2016  Initiation of Revlimid 11/03/20172 weeks on/2 weeks off  Serum M spike and IgG significantly improved 09/25/2016  Velcade held on 10/30/2016 due to urinary retention  Treatment resumed with Revlimid (2 weeks on/2 weeks off) and weekly Decadron on 02/12/2017  2. Severe anemia secondary to #1-markedly improved  3. Diffuse lytic bone lesions secondary to multiple myeloma, status  post Zometa 09/18/2016(plan to continue every 3 months)  4. History of coronary artery disease/myocardial infarction  5. History of colon polyps  6. Bilateral leg edema 09/04/2016. Negative bilateral venous Doppler 09/05/2016.  7. Mild periorbital edema 09/18/2016, question related to early stye formation, question Velcade related chalazia.Improved.  8. CT scan 10/27/2016 with a new right kidney massevaluated by urology; MRI abdomen 11/18/2016 multiple bilateral renal cysts. Most of these are simple cysts. The 2 lesions in question on the CT scan are both hemorrhagic or proteinaceous cysts. No worrisome contrast enhancement.  9. Urinary retention 10/28/2016 status post evaluation in the emergency department. Urinalysis negative for signs of infection 10/30/2016. Velcade held. Resolved.  10. Abdominal pain, nausea, vomiting.Etiology unclear. Upper endoscopy 11/18/2016 with findings of reflux esophagitis, benign appearing esophageal stenosis, small hiatal hernia and erythematous duodenopathy. CT abdomen/pelvis 11/19/2016 with no acute abnormality.  11. 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 Left lower extremity DVT May 2018. Now maintained on Coumadin.   Disposition: Abigail [name] appears stable. Treatment of the myeloma will remain on hold. We will follow-up on the myeloma labs obtained today. She will return for a follow-up visit in 3 weeks.  She will continue close follow-up with Dr. Doylene Canard.    [name] ANP/GNP-BC   04/09/2017  3:10 PM

## 2017-04-10 LAB — KAPPA/LAMBDA LIGHT CHAINS
Ig Kappa Free Light Chain: 5.7 mg/L (ref 3.3–19.4)
Ig Lambda Free Light Chain: 10.5 mg/L (ref 5.7–26.3)
Kappa/Lambda FluidC Ratio: 0.54 (ref 0.26–1.65)

## 2017-04-10 LAB — IGG: IgG, Qn, Serum: 582 mg/dL — ABNORMAL LOW (ref 700–1600)

## 2017-04-12 LAB — PROTEIN ELECTROPHORESIS, SERUM
A/G Ratio: 1.4 (ref 0.7–1.7)
ALPHA 1: 0.3 g/dL (ref 0.0–0.4)
ALPHA 2: 0.9 g/dL (ref 0.4–1.0)
Albumin: 4.1 g/dL (ref 2.9–4.4)
BETA: 1.1 g/dL (ref 0.7–1.3)
GAMMA GLOBULIN: 0.6 g/dL (ref 0.4–1.8)
Globulin, Total: 2.9 g/dL (ref 2.2–3.9)
M-SPIKE, %: 0.2 g/dL — AB
Total Protein: 7 g/dL (ref 6.0–8.5)

## 2017-04-24 ENCOUNTER — Other Ambulatory Visit: Payer: Self-pay | Admitting: Cardiovascular Disease

## 2017-04-24 DIAGNOSIS — N6320 Unspecified lump in the left breast, unspecified quadrant: Secondary | ICD-10-CM

## 2017-04-24 DIAGNOSIS — N6310 Unspecified lump in the right breast, unspecified quadrant: Secondary | ICD-10-CM

## 2017-04-27 ENCOUNTER — Encounter (HOSPITAL_COMMUNITY): Payer: Self-pay | Admitting: Emergency Medicine

## 2017-04-27 ENCOUNTER — Emergency Department (HOSPITAL_COMMUNITY): Payer: Medicare HMO

## 2017-04-27 ENCOUNTER — Emergency Department (HOSPITAL_COMMUNITY)
Admission: EM | Admit: 2017-04-27 | Discharge: 2017-04-27 | Disposition: A | Discharge status: Home / Self Care | Payer: Medicare HMO | Attending: Emergency Medicine | Admitting: Emergency Medicine

## 2017-04-27 DIAGNOSIS — I2510 Atherosclerotic heart disease of native coronary artery without angina pectoris: Secondary | ICD-10-CM | POA: Diagnosis not present

## 2017-04-27 DIAGNOSIS — R0602 Shortness of breath: Secondary | ICD-10-CM | POA: Diagnosis not present

## 2017-04-27 DIAGNOSIS — Z87891 Personal history of nicotine dependence: Secondary | ICD-10-CM | POA: Diagnosis not present

## 2017-04-27 DIAGNOSIS — N183 Chronic kidney disease, stage 3 (moderate): Secondary | ICD-10-CM | POA: Insufficient documentation

## 2017-04-27 DIAGNOSIS — I252 Old myocardial infarction: Secondary | ICD-10-CM | POA: Diagnosis not present

## 2017-04-27 DIAGNOSIS — I129 Hypertensive chronic kidney disease with stage 1 through stage 4 chronic kidney disease, or unspecified chronic kidney disease: Secondary | ICD-10-CM | POA: Diagnosis not present

## 2017-04-27 DIAGNOSIS — Z79899 Other long term (current) drug therapy: Secondary | ICD-10-CM | POA: Insufficient documentation

## 2017-04-27 LAB — CBC
HEMATOCRIT: 32.7 % — AB (ref 36.0–46.0)
HEMOGLOBIN: 10.1 g/dL — AB (ref 12.0–15.0)
MCH: 28.6 pg (ref 26.0–34.0)
MCHC: 30.9 g/dL (ref 30.0–36.0)
MCV: 92.6 fL (ref 78.0–100.0)
Platelets: 219 10*3/uL (ref 150–400)
RBC: 3.53 MIL/uL — ABNORMAL LOW (ref 3.87–5.11)
RDW: 16.7 % — AB (ref 11.5–15.5)
WBC: 4.4 10*3/uL (ref 4.0–10.5)

## 2017-04-27 LAB — I-STAT TROPONIN, ED
TROPONIN I, POC: 0.05 ng/mL (ref 0.00–0.08)
TROPONIN I, POC: 0.06 ng/mL (ref 0.00–0.08)

## 2017-04-27 LAB — BASIC METABOLIC PANEL
ANION GAP: 9 (ref 5–15)
BUN: 12 mg/dL (ref 6–20)
CALCIUM: 8.9 mg/dL (ref 8.9–10.3)
CO2: 18 mmol/L — AB (ref 22–32)
Chloride: 116 mmol/L — ABNORMAL HIGH (ref 101–111)
Creatinine, Ser: 0.82 mg/dL (ref 0.44–1.00)
GFR calc Af Amer: >60 mL/min (ref >60)
GLUCOSE: 93 mg/dL (ref 65–99)
Potassium: 3.3 mmol/L — ABNORMAL LOW (ref 3.5–5.1)
Sodium: 143 mmol/L (ref 135–145)

## 2017-04-27 LAB — PROTIME-INR
INR: 2.13
Prothrombin Time: 24.2 seconds — ABNORMAL HIGH (ref 11.4–15.2)

## 2017-04-27 MED ORDER — POTASSIUM CHLORIDE CRYS ER 10 MEQ PO TBCR: 10.0000 meq | EXTENDED_RELEASE_TABLET | Freq: Two times a day (BID) | ORAL | 1 refills | Status: DC

## 2017-04-27 MED ORDER — FUROSEMIDE 20 MG PO TABS: 40.0000 mg | ORAL_TABLET | ORAL | Status: DC

## 2017-04-27 MED ORDER — WARFARIN SODIUM 5 MG PO TABS
5.0000 mg | ORAL_TABLET | Freq: Once | ORAL | Status: DC
  Filled 2017-04-27: qty 1

## 2017-04-27 MED ORDER — WARFARIN - PHYSICIAN DOSING INPATIENT: Freq: Every day | Status: DC

## 2017-04-27 MED ORDER — FUROSEMIDE 40 MG PO TABS: 40.0000 mg | ORAL_TABLET | ORAL | 1 refills | Status: DC

## 2017-04-27 MED ORDER — POTASSIUM CHLORIDE CRYS ER 10 MEQ PO TBCR: 10.0000 meq | EXTENDED_RELEASE_TABLET | Freq: Two times a day (BID) | ORAL | Status: DC

## 2017-04-27 NOTE — ED Notes (Signed)
IV team states they were unable to start iv, minimal blood draw and sent to lab

## 2017-04-27 NOTE — ED Notes (Signed)
Pt oob to br with steady gbait

## 2017-04-27 NOTE — ED Notes (Signed)
URINE IN TRIAGE IF NEEDED.

## 2017-04-27 NOTE — ED Notes (Addendum)
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 Pt understands and agrees to medication plan.

## 2017-04-27 NOTE — ED Triage Notes (Signed)
Patient woke up at 3am this morning with SOB and chest tightness , denies chest pain , no nausea or emesis , denies diaphoresis or cough . She has a history of CAD/Coronary stent - her cardiologist is Dr. Doylene Canard .

## 2017-04-27 NOTE — Discharge Instructions (Signed)
Take 5 mg of Coumadin today when he had at home. Then at 6 PM take 2.5 mg by mouth. Follow-up in 2 days as planned with your doctor

## 2017-04-27 NOTE — ED Provider Notes (Signed)
[facility] DEPT Provider Note   CSN: [id number] Arrival date & time: 04/27/17  6073     History   Chief Complaint Chief Complaint  Patient presents with  . Shortness of Breath    Chest Tightness    HPI Abigail [name] is a 71 y.o. female.  Patient states she had some shortness of breath early this morning but she feels better now.   The history is provided by the patient.  Shortness of Breath  This is a recurrent problem. The problem occurs intermittently.The current episode started 3 to 5 hours ago. The problem has been resolved. Pertinent negatives include no fever, no headaches, no cough, no chest pain, no abdominal pain and no rash. It is unknown what precipitated the problem. Risk factors: Coronary artery disease. She has tried nothing for the symptoms.    Past Medical History:  Diagnosis Date  . Anemia    "off and on all my life" (12/22/2016)  . Anxiety   . Bilateral renal artery stenosis ([place]) 1999   s/p stenting  . Chronic kidney disease (CKD), stage III (moderate)    Archie Endo 12/22/2016  . Coronary artery disease 1999  . Depression   . Diverticulosis   . Duodenitis   . Gastric erosions   . Gastric ulcer   . GERD (gastroesophageal reflux disease)   . Heart murmur   . History of blood transfusion ~ 03/2016   "low HgB; practically nonexistent"  . Hyperlipidemia   . Hypertension   . MI (myocardial infarction) ([place]) 1999  . Multiple myeloma ([place]) dx'd ~ 04/2016  . PAT (paroxysmal atrial tachycardia) ([place]) 12/22/2016   Archie Endo 12/22/2016  . Retinal hemorrhage, right eye   . Schatzki's ring   . Tachy-brady syndrome ([place])    Archie Endo 12/22/2016  . Tubular adenoma of colon     Patient Active Problem List   Diagnosis Date Noted  . STEMI (ST elevation myocardial infarction) ([place]) 02/24/2017  . Acute anterolateral wall MI ([place]) 02/24/2017  . Dehydration 01/21/2017  . Acute coronary syndrome ([place]) 12/24/2016  . Dizziness 12/22/2016    Class: Acute  . Orthostatic  hypotension 12/08/2016  . Syncope 12/08/2016  . Syncope and collapse 12/06/2016  . Tachycardia-bradycardia ([place])   . Essential hypertension   . Nausea and vomiting in adult patient   . Erosive esophagitis   . Protein-calorie malnutrition, severe 11/19/2016  . LLQ pain   . LUQ abdominal pain   . Intractable nausea and vomiting 11/17/2016  . Abdominal pain 11/05/2016  . Multiple myeloma ([place]) 06/18/2016  . Malnutrition of moderate degree 06/14/2016  . 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 Exertional dyspnea 11/01/2015  . Coronary atherosclerosis of native coronary artery 11/01/2015  . Shortness of breath 11/01/2015  . Chest pain 12/31/2011    Past Surgical History:  Procedure Laterality Date  . APPENDECTOMY    . BACK SURGERY    . CATARACT EXTRACTION W/ INTRAOCULAR LENS IMPLANT Right 2014  . CORONARY ANGIOGRAPHY N/A 01/23/2017   Procedure: Coronary Angiography;  Surgeon: [name], MD;  Location: [facility];  Service: Cardiovascular;  Laterality: N/A;  . CORONARY ANGIOPLASTY WITH STENT PLACEMENT  1999  . CORONARY STENT INTERVENTION N/A 12/29/2016   Procedure: Coronary Stent Intervention;  Surgeon: Charolette Forward, MD;  Location: [facility];  Service: Cardiovascular;  Laterality: N/A;  . CORONARY STENT INTERVENTION N/A 02/24/2017   Procedure: Coronary Stent Intervention;  Surgeon: Charolette Forward, MD;  Location: [facility];  Service: Cardiovascular;  Laterality: N/A;  . ESOPHAGOGASTRODUODENOSCOPY N/A 11/03/2015   Procedure: ESOPHAGOGASTRODUODENOSCOPY (EGD);  Surgeon: Carol Ada, MD;  Location[facility];  Service: Endoscopy;  Laterality: N/A;  . ESOPHAGOGASTRODUODENOSCOPY (EGD) WITH PROPOFOL N/A 11/18/2016   Procedure: ESOPHAGOGASTRODUODENOSCOPY (EGD) WITH PROPOFOL;  Surgeon: Ladene Artist, MD;  Location: [facility]OS[facility];  Service: Endoscopy;  Laterality: N/A;  . LEFT HEART CATH AND CORONARY ANGIOGRAPHY N/A 12/24/2016   Procedure:  Left Heart Cath and Coronary Angiography;  Surgeon: [name], MD;  Location: [facility];  Service: Cardiovascular;  Laterality: N/A;  . LEFT HEART CATH AND CORONARY ANGIOGRAPHY N/A 02/24/2017   Procedure: Left Heart Cath and Coronary Angiography;  Surgeon: Charolette Forward, MD;  Location: [facility];  Service: Cardiovascular;  Laterality: N/A;  . LEFT HEART CATH AND CORONARY ANGIOGRAPHY N/A 03/05/2017   Procedure: Left Heart Cath and Coronary Angiography;  Surgeon: [name], MD;  Location: [facility];  Service: Cardiovascular;  Laterality: N/A;  . LUMBAR [place]    . RENAL ARTERY STENT  1999   bil RAS so ? bil vs unilateral stents  . RETINAL LASER PROCEDURE Right 2012   "bleeding"  . TONSILLECTOMY    . TOTAL ABDOMINAL HYSTERECTOMY      OB History    No data available       Home Medications    Prior to Admission medications   Medication Sig Start Date End Date Taking? Authorizing Provider  acyclovir (ZOVIRAX) 400 MG tablet TAKE 1 TABLET (400 MG TOTAL) BY MOUTH 2 (TWO) TIMES DAILY. 08/29/16  Yes Ladell Pier, MD  amiodarone (PACERONE) 200 MG tablet Take 0.5 tablets (100 mg total) by mouth daily. 03/12/17  Yes [name], MD  Ascorbic Acid (VITAMIN C) 1000 MG tablet Take 1,000 mg by mouth daily.    Yes [provider]  atorvastatin (LIPITOR) 80 MG tablet Take 1 tablet (80 mg total) by mouth daily at 6 PM. 03/01/17  Yes [name], MD  calcium carbonate (OS-CAL - DOSED IN MG OF ELEMENTAL CALCIUM) 1250 (500 Ca) MG tablet Take 1 tablet by mouth daily with breakfast.   Yes [provider]  Cholecalciferol (VITAMIN D PO) Take 1 capsule by mouth daily.   Yes [provider]  ferrous sulfate 325 (65 FE) MG tablet Take 1 tablet (325 mg total) by mouth daily with breakfast. 03/02/17  Yes [name], MD  mirtazapine (REMERON) 15 MG tablet Take 1 tablet (15 mg total) by mouth at bedtime. Patient taking differently: Take 30 mg by  mouth at bedtime.  12/27/16  Yes [name], MD  pantoprazole (PROTONIX) 40 MG tablet Take 1 tablet (40 mg total) by mouth daily. 03/12/17  Yes [name], MD  pyridostigmine (MESTINON) 60 MG tablet Take 0.5 tablets (30 mg total) by mouth 2 (two) times daily. 01/25/17  Yes [name], MD  ticagrelor (BRILINTA) 90 MG TABS tablet Take 1 tablet (90 mg total) by mouth 2 (two) times daily. 12/27/16  Yes [name], MD  traMADol (ULTRAM) 50 MG tablet Take 50 mg by mouth at bedtime as needed for pain. 03/25/17  Yes [provider]  warfarin (COUMADIN) 5 MG tablet Take 1 tablet (5 mg total) by mouth daily at 6 PM. 5 mg. M-W-F and 2.5 mg. On T-Th-Sat-Sun. Patient taking differently: Take 5 mg by mouth daily at 6 PM. 1 tablet (64m) all days except on Sun take 1/2 tablet (2.543m 04/08/17  Yes KaDixie Dials  MD  feeding supplement, ENSURE ENLIVE, (ENSURE ENLIVE) LIQD Take 237 mLs by mouth 2 (two) times daily between meals. Patient taking differently: Take 237 mLs by mouth as needed (low appetite).  11/27/16   Mariel Aloe, MD  nitroGLYCERIN (NITROSTAT) 0.4 MG SL tablet Place 0.4 mg under the tongue every 5 (five) minutes as needed for chest pain. 03/20/17   [provider]  polyethylene glycol (MIRALAX / GLYCOLAX) packet Take 17 g by mouth daily as needed for moderate constipation.     [provider]    Family History Family History  Problem Relation Age of Onset  . Colon cancer Neg Hx     Social History Social History  Substance Use Topics  . Smoking status: Former Smoker    Packs/day: 0.50    Years: 50.00    Types: Cigarettes    Quit date: 11/20/2016  . Smokeless tobacco: Never Used  . Alcohol use No     Allergies   Sulfa antibiotics   Review of Systems Review of Systems  Constitutional: Negative for appetite change, fatigue and fever.  HENT: Negative for congestion, ear discharge and sinus pressure.   Eyes: Negative for discharge.  Respiratory: Positive  for shortness of breath. Negative for cough.   Cardiovascular: Negative for chest pain.  Gastrointestinal: Negative for abdominal pain and diarrhea.  Genitourinary: Negative for frequency and hematuria.  Musculoskeletal: Negative for back pain.  Skin: Negative for rash.  Neurological: Negative for seizures and headaches.  Psychiatric/Behavioral: Negative for hallucinations.     Physical Exam Updated Vital Signs BP (!) 179/112   Pulse 60   Temp 97.9 F (36.6 C) (Oral)   Resp (!) 25   Ht 5' 5" (1.651 m)   Wt 63.5 kg (140 lb)   SpO2 96%   BMI 23.30 kg/m   Physical Exam  Constitutional: She is oriented to person, place, and time. She appears well-developed.  HENT:  Head: Normocephalic.  Eyes: Conjunctivae and EOM are normal. No scleral icterus.  Neck: Neck supple. No thyromegaly present.  Cardiovascular: Normal rate and regular rhythm.  Exam reveals no gallop and no friction rub.   No murmur heard. Pulmonary/Chest: No stridor. She has no wheezes. She has no rales. She exhibits no tenderness.  Abdominal: She exhibits no distension. There is no tenderness. There is no rebound.  Musculoskeletal: Normal range of motion. She exhibits no edema.  Lymphadenopathy:    She has no cervical adenopathy.  Neurological: She is oriented to person, place, and time. She exhibits normal muscle tone. Coordination normal.  Skin: No rash noted. No erythema.  Psychiatric: She has a normal mood and affect. Her behavior is normal.     ED Treatments / Results  Labs (all labs ordered are listed, but only abnormal results are displayed) Labs Reviewed  BASIC METABOLIC PANEL - Abnormal; Notable for the following:       Result Value   Potassium 3.3 (*)    Chloride 116 (*)    CO2 18 (*)    All other components within normal limits  CBC - Abnormal; Notable for the following:    RBC 3.53 (*)    Hemoglobin 10.1 (*)    HCT 32.7 (*)    RDW 16.7 (*)    All other components within normal limits    PROTIME-INR - Abnormal; Notable for the following:    Prothrombin Time 24.2 (*)    All other components within normal limits  I-STAT TROPOININ, ED  Randolm Idol, ED  EKG  EKG Interpretation  Date/Time:  Monday April 27 2017 04:43:47 EDT Ventricular Rate:  71 PR Interval:  148 QRS Duration: 76 QT Interval:  496 QTC Calculation: 538 R Axis:   101 Text Interpretation:  Sinus bradycardia Ventricular bigeminy Septal infarct , age undetermined Lateral infarct , age undetermined Prolonged QT Abnormal ECG Confirmed by Pryor Curia [phone]) on 04/27/2017 5:52:12 AM Also confirmed by Pryor Curia [phone]), editor Hattie Perch ([phone])  on 04/27/2017 6:56:52 AM Also confirmed by Milton Ferguson [phone])  on 04/27/2017 8:34:42 AM       Radiology Dg Chest 2 View  Result Date: 04/27/2017 CLINICAL DATA:  21-year-old female with chest tightness and shortness of breath. EXAM: CHEST  2 VIEW COMPARISON:  Chest radiograph dated 04/02/2017 FINDINGS: Bibasilar linear interstitial densities most consistent with atelectasis/scarring. There is overall increased densities compared to the prior radiograph which may represent atelectatic changes although infiltrate is not excluded. Clinical correlation is recommended. No large pleural effusion. No pneumothorax. Stable top-normal cardiac silhouette. Coronary vascular stent noted. No acute osseous pathology. IMPRESSION: Probable bibasilar atelectatic changes. Infiltrate is less likely but not excluded. Clinical correlation is recommended. Electronically Signed   By: Anner Crete M.D.   On: 04/27/2017 05:49    Procedures Procedures (including critical care time)  Medications Ordered in ED Medications  warfarin (COUMADIN) tablet 5 mg (not administered)     Initial Impression / Assessment and Plan / ED Course  I have reviewed the triage vital signs and the nursing notes.  Pertinent labs & imaging results that were available during my care of the  patient were reviewed by me and considered in my medical decision making (see chart for details).     Patient with history coronary artery disease. Patient had 2 troponins that were negative. I spoke with her primary care doctor and we will increase her Coumadin slightly. He is to follow-up with her in 2 days  Final Clinical Impressions(s) / ED Diagnoses   Final diagnoses:  SOB (shortness of breath)    New Prescriptions New Prescriptions   No medications on file     Milton Ferguson, MD 04/27/17 1156

## 2017-04-27 NOTE — Consult Note (Signed)
Referring Physician:  [name] is an 71 y.o. female.                       Chief Complaint: Shortness of breath  HPI: 71 year old female with PMH of anemia, CAD, MI and multiple myeloma has shortness of breath x 1 day. She has no leg edema and her chest x-ray is negative for pulmonary edema.   Past Medical History:  Diagnosis Date  . Anemia    "off and on all my life" (12/22/2016)  . Anxiety   . Bilateral renal artery stenosis ([place]) 1999   s/p stenting  . Chronic kidney disease (CKD), stage III (moderate)    Archie Endo 12/22/2016  . Coronary artery disease 1999  . Depression   . Diverticulosis   . Duodenitis   . Gastric erosions   . Gastric ulcer   . GERD (gastroesophageal reflux disease)   . Heart murmur   . History of blood transfusion ~ 03/2016   "low HgB; practically nonexistent"  . Hyperlipidemia   . Hypertension   . MI (myocardial infarction) ([place]) 1999  . Multiple myeloma ([place]) dx'd ~ 04/2016  . PAT (paroxysmal atrial tachycardia) ([place]) 12/22/2016   Archie Endo 12/22/2016  . Retinal hemorrhage, right eye   . Schatzki's ring   . Tachy-brady syndrome ([place])    Archie Endo 12/22/2016  . Tubular adenoma of colon       Past Surgical History:  Procedure Laterality Date  . APPENDECTOMY    . BACK SURGERY    . CATARACT EXTRACTION W/ INTRAOCULAR LENS IMPLANT Right 2014  . CORONARY ANGIOGRAPHY N/A 01/23/2017   Procedure: Coronary Angiography;  Surgeon: [name], MD;  Location: [facility];  Service: Cardiovascular;  Laterality: N/A;  . CORONARY ANGIOPLASTY WITH STENT PLACEMENT  1999  . CORONARY STENT INTERVENTION N/A 12/29/2016   Procedure: Coronary Stent Intervention;  Surgeon: Charolette Forward, MD;  Location: [facility];  Service: Cardiovascular;  Laterality: N/A;  . CORONARY STENT INTERVENTION N/A 02/24/2017   Procedure: Coronary Stent Intervention;  Surgeon: Charolette Forward, MD;  Location: [facility];  Service: Cardiovascular;  Laterality: N/A;  .  ESOPHAGOGASTRODUODENOSCOPY N/A 11/03/2015   Procedure: ESOPHAGOGASTRODUODENOSCOPY (EGD);  Surgeon: Carol Ada, MD;  Location[facility];  Service: Endoscopy;  Laterality: N/A;  . ESOPHAGOGASTRODUODENOSCOPY (EGD) WITH PROPOFOL N/A 11/18/2016   Procedure: ESOPHAGOGASTRODUODENOSCOPY (EGD) WITH PROPOFOL;  Surgeon: Ladene Artist, MD;  Location: [facility];  Service: Endoscopy;  Laterality: N/A;  . LEFT HEART CATH AND CORONARY ANGIOGRAPHY N/A 12/24/2016   Procedure: Left Heart Cath and Coronary Angiography;  Surgeon: [name], MD;  Location: [facility];  Service: Cardiovascular;  Laterality: N/A;  . LEFT HEART CATH AND CORONARY ANGIOGRAPHY N/A 02/24/2017   Procedure: Left Heart Cath and Coronary Angiography;  Surgeon: Charolette Forward, MD;  Location: [facility];  Service: Cardiovascular;  Laterality: N/A;  . LEFT HEART CATH AND CORONARY ANGIOGRAPHY N/A 03/05/2017   Procedure: Left Heart Cath and Coronary Angiography;  Surgeon: [name], MD;  Location: [facility];  Service: Cardiovascular;  Laterality: N/A;  . LUMBAR [place]    . RENAL ARTERY STENT  1999   bil RAS so ? bil vs unilateral stents  . RETINAL LASER PROCEDURE Right 2012   "bleeding"  . TONSILLECTOMY    . TOTAL ABDOMINAL HYSTERECTOMY      Family History  Problem Relation Age of Onset  . Colon cancer Neg  Hx    Social History:  reports that she quit smoking about 5 months ago. Her smoking use included Cigarettes. She has a 25.00 pack-year smoking history. She has never used smokeless tobacco. She reports that she does not drink alcohol or use drugs.  Allergies:  Allergies  Allergen Reactions  . Sulfa Antibiotics Rash     (Not in a hospital admission)  Results for orders placed or performed during the hospital encounter of 04/27/17 (from the past 48 hour(s))  I-stat troponin, ED     Status: None   Collection Time: 04/27/17  5:03 AM  Result Value Ref Range   Troponin i, poc 0.05 0.00 - 0.08  ng/mL   Comment 3            Comment: Due to the release kinetics of cTnI, a negative result within the first hours of the onset of symptoms does not rule out myocardial infarction with certainty. If myocardial infarction is still suspected, repeat the test at appropriate intervals.   Basic metabolic panel     Status: Abnormal   Collection Time: 04/27/17  5:11 AM  Result Value Ref Range   Sodium 143 135 - 145 mmol/L   Potassium 3.3 (L) 3.5 - 5.1 mmol/L   Chloride 116 (H) 101 - 111 mmol/L   CO2 18 (L) 22 - 32 mmol/L   Glucose, Bld 93 65 - 99 mg/dL   BUN 12 6 - 20 mg/dL   Creatinine, Ser 0.82 0.44 - 1.00 mg/dL   Calcium 8.9 8.9 - 10.3 mg/dL   GFR calc non Af Amer >60 >60 mL/min   GFR calc Af Amer >60 >60 mL/min    Comment: (NOTE) The eGFR has been calculated using the CKD EPI equation. This calculation has not been validated in all clinical situations. eGFR's persistently <60 mL/min signify possible Chronic Kidney Disease.    Anion gap 9 5 - 15  CBC     Status: Abnormal   Collection Time: 04/27/17  5:11 AM  Result Value Ref Range   WBC 4.4 4.0 - 10.5 K/uL   RBC 3.53 (L) 3.87 - 5.11 MIL/uL   Hemoglobin 10.1 (L) 12.0 - 15.0 g/dL   HCT 32.7 (L) 36.0 - 46.0 %   MCV 92.6 78.0 - 100.0 fL   MCH 28.6 26.0 - 34.0 pg   MCHC 30.9 30.0 - 36.0 g/dL   RDW 16.7 (H) 11.5 - 15.5 %   Platelets 219 150 - 400 K/uL  Protime-INR (order if Patient is taking Coumadin / Warfarin)     Status: Abnormal   Collection Time: 04/27/17  5:11 AM  Result Value Ref Range   Prothrombin Time 24.2 (H) 11.4 - 15.2 seconds   INR 2.13   I-stat troponin, ED     Status: None   Collection Time: 04/27/17 10:49 AM  Result Value Ref Range   Troponin i, poc 0.06 0.00 - 0.08 ng/mL   Comment 3            Comment: Due to the release kinetics of cTnI, a negative result within the first hours of the onset of symptoms does not rule out myocardial infarction with certainty. If myocardial infarction is still  suspected, repeat the test at appropriate intervals.    Dg Chest 2 View  Result Date: 04/27/2017 CLINICAL DATA:  2-year-old female with chest tightness and shortness of breath. EXAM: CHEST  2 VIEW COMPARISON:  Chest radiograph dated 04/02/2017 FINDINGS: Bibasilar linear interstitial densities most consistent with atelectasis/scarring.  There is overall increased densities compared to the prior radiograph which may represent atelectatic changes although infiltrate is not excluded. Clinical correlation is recommended. No large pleural effusion. No pneumothorax. Stable top-normal cardiac silhouette. Coronary vascular stent noted. No acute osseous pathology. IMPRESSION: Probable bibasilar atelectatic changes. Infiltrate is less likely but not excluded. Clinical correlation is recommended. Electronically Signed   By: Anner Crete M.D.   On: 04/27/2017 05:49    Review Of Systems Constitutional: No fever, chills, weight loss or gain. Eyes: No vision change, wears glasses. No discharge or pain. Ears: No hearing loss, No tinnitus. Respiratory: No asthma, COPD, pneumonias. Positive shortness of breath. No hemoptysis. Cardiovascular: Positive chest pain, palpitation, leg edema. Gastrointestinal: No nausea, vomiting, diarrhea, constipation. No GI bleed. No hepatitis. Genitourinary: No dysuria, hematuria, kidney stone. No incontinance. Neurological: Positive headache, stroke, seizures.  Psychiatry: No psych facility admission for anxiety, depression, suicide. No detox. Skin: No rash. Musculoskeletal: Positive joint pain, fibromyalgia. No neck pain, back pain. Lymphadenopathy: No lymphadenopathy. Hematology: Positive anemia or easy bruising.   Blood pressure (!) 176/97, pulse (!) 56, temperature 97.9 F (36.6 C), temperature source Oral, resp. rate (!) 21, height '5\' 5"'  (1.651 m), weight 63.5 kg (140 lb), SpO2 100 %. Body mass index is 23.3 kg/m. General appearance: alert, cooperative, appears stated  age and no distress Head: Normocephalic, atraumatic. Eyes: Brown eyes, pale pink conjunctiva, corneas clear. PERRL, EOM's intact. Neck: No adenopathy, no carotid bruit, no JVD, supple, symmetrical, trachea midline and thyroid not enlarged. Resp: Basal crackles to auscultation bilaterally. Cardio: Regular rate and rhythm, S1, S2 normal, II/VI systolic murmur, no click, rub or gallop GI: Soft, non-tender; bowel sounds normal; no organomegaly. Extremities: No edema, cyanosis or clubbing. Skin: Warm and dry.  Neurologic: Alert and oriented X 3, normal strength. Normal coordination and gait.  Assessment/Plan Shortness of breath CAD S/P stent in LAD and LCx Anemia of blood loss Multiple myeloma Small apical thrombus in LV Hypokalemia  Add prn lasix and increase potassium to twice daily. Increase warfarin to 5 mg. daily.  Birdie Riddle, MD  04/27/2017, 12:02 PM

## 2017-04-27 NOTE — ED Notes (Signed)
IV team at bedside 

## 2017-04-30 ENCOUNTER — Ambulatory Visit (HOSPITAL_BASED_OUTPATIENT_CLINIC_OR_DEPARTMENT_OTHER): Payer: Medicare HMO | Admitting: Nurse Practitioner

## 2017-04-30 ENCOUNTER — Telehealth: Payer: Self-pay | Admitting: Nurse Practitioner

## 2017-04-30 ENCOUNTER — Other Ambulatory Visit (HOSPITAL_BASED_OUTPATIENT_CLINIC_OR_DEPARTMENT_OTHER): Payer: Medicare HMO

## 2017-04-30 VITALS — BP 128/82 | HR 55 | Temp 98.7°F | Resp 16 | Ht 65.0 in | Wt 139.6 lb

## 2017-04-30 DIAGNOSIS — Z7901 Long term (current) use of anticoagulants: Secondary | ICD-10-CM

## 2017-04-30 DIAGNOSIS — C9000 Multiple myeloma not having achieved remission: Secondary | ICD-10-CM | POA: Diagnosis not present

## 2017-04-30 DIAGNOSIS — I82402 Acute embolism and thrombosis of unspecified deep veins of left lower extremity: Secondary | ICD-10-CM | POA: Diagnosis not present

## 2017-04-30 DIAGNOSIS — D630 Anemia in neoplastic disease: Secondary | ICD-10-CM

## 2017-04-30 LAB — CBC WITH DIFFERENTIAL/PLATELET
BASO%: 0.2 % (ref 0.0–2.0)
BASOS ABS: 0 10*3/uL (ref 0.0–0.1)
EOS ABS: 0.1 10*3/uL (ref 0.0–0.5)
EOS%: 3.3 % (ref 0.0–7.0)
HCT: 31.5 % — ABNORMAL LOW (ref 34.8–46.6)
HGB: 9.8 g/dL — ABNORMAL LOW (ref 11.6–15.9)
LYMPH%: 34.3 % (ref 14.0–49.7)
MCH: 29.3 pg (ref 25.1–34.0)
MCHC: 31.1 g/dL — ABNORMAL LOW (ref 31.5–36.0)
MCV: 94.3 fL (ref 79.5–101.0)
MONO#: 0.5 10*3/uL (ref 0.1–0.9)
MONO%: 12 % (ref 0.0–14.0)
NEUT#: 2.1 10*3/uL (ref 1.5–6.5)
NEUT%: 50.2 % (ref 38.4–76.8)
PLATELETS: 214 10*3/uL (ref 145–400)
RBC: 3.34 10*6/uL — ABNORMAL LOW (ref 3.70–5.45)
RDW: 16.9 % — ABNORMAL HIGH (ref 11.2–14.5)
WBC: 4.3 10*3/uL (ref 3.9–10.3)
lymph#: 1.5 10*3/uL (ref 0.9–3.3)

## 2017-04-30 NOTE — Telephone Encounter (Signed)
Scheduled appt per 7/12 los - Gave patient AVS and calender per los.  

## 2017-04-30 NOTE — Progress Notes (Signed)
  [place] OFFICE PROGRESS NOTE   Diagnosis:  Multiple myeloma  INTERVAL HISTORY:   Ms. Schlotterbeck returns as scheduled. Treatment for myeloma is on hold. She feels well. She denies pain. No interim illnesses or infections. She denies any bleeding. No nausea or vomiting. She has a good appetite. She is gaining weight.  Objective:  Vital signs in last 24 hours:  Blood pressure 128/82, pulse (!) 55, temperature 98.7 F (37.1 C), temperature source Oral, resp. rate 16, height _0  (1.651 m), weight 139 lb 9.6 oz (63.3 kg), SpO2 100 %.    HEENT: No thrush. Lymphatics: No palpable cervical or supraclavicular lymph nodes. Resp: Lungs clear bilaterally. Cardio: Regular rate and rhythm. GI: No hepatosplenomegaly. Vascular: No leg edema.   Lab Results:  Lab Results  Component Value Date   WBC 4.3 04/30/2017   HGB 9.8 (L) 04/30/2017   HCT 31.5 (L) 04/30/2017   MCV 94.3 04/30/2017   PLT 214 04/30/2017   NEUTROABS 2.1 04/30/2017    Imaging:  No results found.  Medications: I have reviewed the patient's current medications.  Assessment/Plan: 1. Multiple myeloma, IgG lambda, bone marrow biopsy 06/15/2016 confirmed multiple myeloma; cytogenetics by FISHshow +11, +12, 13q-  Elevated serum free lambda light chains  Lambda light chain proteinuria  Lytic bone lesions on a bone survey 06/13/2016  Initiation of weekly Velcade/Decadron 06/19/2016  Serum light chains improved 07/31/2016  Initiation of Revlimid 11/03/20172 weeks on/2 weeks off  Serum M spike and IgG significantly improved 09/25/2016  Velcade held on 10/30/2016 due to urinary retention  Treatment resumed with Revlimid (2 weeks on/2 weeks off) and weekly Decadron on 02/12/2017  2. Severe anemia secondary to #1-markedly improved  3. Diffuse lytic bone lesions secondary to multiple myeloma, status post Zometa 09/18/2016(plan to continue every 3 months)  4. History of coronary artery  disease/myocardial infarction  5. History of colon polyps  6. Bilateral leg edema 09/04/2016. Negative bilateral venous Doppler 09/05/2016.  7. Mild periorbital edema 09/18/2016, question related to early stye formation, question Velcade related chalazia.Improved.  8. CT scan 10/27/2016 with a new right kidney massevaluated by urology; MRI abdomen 11/18/2016 multiple bilateral renal cysts. Most of these are simple cysts. The 2 lesions in question on the CT scan are both hemorrhagic or proteinaceous cysts. No worrisome contrast enhancement.  9. Urinary retention 10/28/2016 status post evaluation in the emergency department. Urinalysis negative for signs of infection 10/30/2016. Velcade held. Resolved.  10. Abdominal pain, nausea, vomiting.Etiology unclear. Upper endoscopy 11/18/2016 with findings of reflux esophagitis, benign appearing esophageal stenosis, small hiatal hernia and erythematous duodenopathy. CT abdomen/pelvis 11/19/2016 with no acute abnormality.  11. 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 Left lower extremity DVT May 2018. Now maintained on Coumadin.    Disposition: Ms. Swor appears stable. Most recent myeloma labs continued to show significant improvement as compared to labs at time of diagnosis. The plan is to continue to follow with observation off of treatment. She will return for labs and a follow-up visit in 6 weeks.  Plan reviewed with Dr. Benay Spice.    [name] ANP/GNP-BC   04/30/2017  3:09 PM

## 2017-05-01 ENCOUNTER — Ambulatory Visit: Admission: RE | Admit: 2017-05-01 | Payer: Medicare HMO | Source: Ambulatory Visit

## 2017-05-01 ENCOUNTER — Ambulatory Visit
Admission: RE | Admit: 2017-05-01 | Discharge: 2017-05-01 | Disposition: A | Discharge status: Home / Self Care | Payer: Medicare HMO | Source: Ambulatory Visit | Attending: Cardiovascular Disease | Admitting: Cardiovascular Disease

## 2017-05-01 ENCOUNTER — Other Ambulatory Visit: Payer: Self-pay | Admitting: Cardiovascular Disease

## 2017-05-01 DIAGNOSIS — N6310 Unspecified lump in the right breast, unspecified quadrant: Secondary | ICD-10-CM

## 2017-05-01 DIAGNOSIS — N6320 Unspecified lump in the left breast, unspecified quadrant: Secondary | ICD-10-CM

## 2017-06-11 ENCOUNTER — Other Ambulatory Visit (HOSPITAL_BASED_OUTPATIENT_CLINIC_OR_DEPARTMENT_OTHER): Payer: Medicare HMO

## 2017-06-11 ENCOUNTER — Ambulatory Visit (HOSPITAL_BASED_OUTPATIENT_CLINIC_OR_DEPARTMENT_OTHER): Payer: Medicare HMO | Admitting: Oncology

## 2017-06-11 ENCOUNTER — Telehealth: Payer: Self-pay | Admitting: Oncology

## 2017-06-11 VITALS — BP 177/63 | HR 46 | Temp 98.6°F | Resp 18 | Ht 65.0 in | Wt 150.3 lb

## 2017-06-11 DIAGNOSIS — D630 Anemia in neoplastic disease: Secondary | ICD-10-CM | POA: Diagnosis not present

## 2017-06-11 DIAGNOSIS — C9000 Multiple myeloma not having achieved remission: Secondary | ICD-10-CM

## 2017-06-11 LAB — COMPREHENSIVE METABOLIC PANEL
ALBUMIN: 4 g/dL (ref 3.5–5.0)
ALK PHOS: 128 U/L (ref 40–150)
ALT: 20 U/L (ref 0–55)
AST: 20 U/L (ref 5–34)
Anion Gap: 9 mEq/L (ref 3–11)
BUN: 18.7 mg/dL (ref 7.0–26.0)
CALCIUM: 9.6 mg/dL (ref 8.4–10.4)
CHLORIDE: 113 meq/L — AB (ref 98–109)
CO2: 20 mEq/L — ABNORMAL LOW (ref 22–29)
CREATININE: 1.2 mg/dL — AB (ref 0.6–1.1)
EGFR: 53 mL/min/{1.73_m2} — ABNORMAL LOW (ref >90)
Glucose: 74 mg/dl (ref 70–140)
Potassium: 4.3 mEq/L (ref 3.5–5.1)
Sodium: 142 mEq/L (ref 136–145)
Total Bilirubin: 0.53 mg/dL (ref 0.20–1.20)
Total Protein: 7 g/dL (ref 6.4–8.3)

## 2017-06-11 LAB — CBC WITH DIFFERENTIAL/PLATELET
BASO%: 0.9 % (ref 0.0–2.0)
Basophils Absolute: 0 10*3/uL (ref 0.0–0.1)
EOS%: 4.1 % (ref 0.0–7.0)
Eosinophils Absolute: 0.1 10*3/uL (ref 0.0–0.5)
HEMATOCRIT: 34.6 % — AB (ref 34.8–46.6)
HEMOGLOBIN: 11 g/dL — AB (ref 11.6–15.9)
LYMPH#: 1.1 10*3/uL (ref 0.9–3.3)
LYMPH%: 35.4 % (ref 14.0–49.7)
MCH: 29.6 pg (ref 25.1–34.0)
MCHC: 31.7 g/dL (ref 31.5–36.0)
MCV: 93.2 fL (ref 79.5–101.0)
MONO#: 0.3 10*3/uL (ref 0.1–0.9)
MONO%: 9 % (ref 0.0–14.0)
NEUT%: 50.6 % (ref 38.4–76.8)
NEUTROS ABS: 1.6 10*3/uL (ref 1.5–6.5)
Platelets: 180 10*3/uL (ref 145–400)
RBC: 3.71 10*6/uL (ref 3.70–5.45)
RDW: 17.5 % — AB (ref 11.2–14.5)
WBC: 3.2 10*3/uL — ABNORMAL LOW (ref 3.9–10.3)

## 2017-06-11 NOTE — Progress Notes (Signed)
[place] OFFICE PROGRESS NOTE   Diagnosis: Multiple myeloma  INTERVAL HISTORY:   Abigail [name] returns as scheduled per she feels well. Her dyspnea. She reports 1 episode of chest pain since she was last in the hospital. She is followed closely by Dr. Doylene Canard.  Objective:  Vital signs in last 24 hours:  Blood pressure (!) 177/63, pulse (!) 46, temperature 98.6 F (37 C), temperature source Oral, resp. rate 18, height _0  (1.651 m), weight 150 lb 4.8 oz (68.2 kg), SpO2 100 %.  Repeat manual pulse 48 and 52  Resp: Lungs clear bilaterally Cardio: Regular rate and rhythm GI: No hepatosplenomegaly Vascular: No leg edema   Lab Results:  Lab Results  Component Value Date   WBC 3.2 (L) 06/11/2017   HGB 11.0 (L) 06/11/2017   HCT 34.6 (L) 06/11/2017   MCV 93.2 06/11/2017   PLT 180 06/11/2017   NEUTROABS 1.6 06/11/2017    CMP     Component Value Date/Time   NA 142 06/11/2017 1451   K 4.3 06/11/2017 1451   CL 116 (H) 04/27/2017 0511   CO2 20 (L) 06/11/2017 1451   GLUCOSE 74 06/11/2017 1451   BUN 18.7 06/11/2017 1451   CREATININE 1.2 (H) 06/11/2017 1451   CALCIUM 9.6 06/11/2017 1451   PROT 7.0 06/11/2017 1451   ALBUMIN 4.0 06/11/2017 1451   AST 20 06/11/2017 1451   ALT 20 06/11/2017 1451   ALKPHOS 128 06/11/2017 1451   BILITOT 0.53 06/11/2017 1451   GFRNONAA >60 04/27/2017 0511   GFRAA >60 04/27/2017 0511   04/09/2017: IgG 582, lambda free light chains 10.5, serum M spike 0.2  Medications: I have reviewed the patient's current medications.  Assessment/Plan: 1. Multiple myeloma, IgG lambda, bone marrow biopsy 06/15/2016 confirmed multiple myeloma; cytogenetics by FISHshow +11, +12, 13q-  Elevated serum free lambda light chains  Lambda light chain proteinuria  Lytic bone lesions on a bone survey 06/13/2016  Initiation of weekly Velcade/Decadron 06/19/2016  Serum light chains improved 07/31/2016  Initiation of Revlimid 11/03/20172 weeks on/2  weeks off  Serum M spike and IgG significantly improved 09/25/2016  Velcade held on 10/30/2016 due to urinary retention  Treatment resumed with Revlimid (2 weeks on/2 weeks off) and weekly Decadron on 02/12/2017, treatment placed on hold May 2018 secondary to admissions with cardiac disease   2. Severe anemia secondary to #1-markedly improved  3. Diffuse lytic bone lesions secondary to multiple myeloma, status post Zometa 09/18/2016(plan to continue every 3 months)  4. History of coronary artery disease/myocardial infarction  5. History of colon polyps  6. Bilateral leg edema 09/04/2016. Negative bilateral venous Doppler 09/05/2016.  7. Mild periorbital edema 09/18/2016, question related to early stye formation, question Velcade related chalazia.Improved.  8. CT scan 10/27/2016 with a new right kidney massevaluated by urology; MRI abdomen 11/18/2016 multiple bilateral renal cysts. Most of these are simple cysts. The 2 lesions in question on the CT scan are both hemorrhagic or proteinaceous cysts. No worrisome contrast enhancement.  9. Urinary retention 10/28/2016 status post evaluation in the emergency department. Urinalysis negative for signs of infection 10/30/2016. Velcade held. Resolved.  10. Abdominal pain, nausea, vomiting.Etiology unclear. Upper endoscopy 11/18/2016 with findings of reflux esophagitis, benign appearing esophageal stenosis, small hiatal hernia and erythematous duodenopathy. CT abdomen/pelvis 11/19/2016 with no acute abnormality.  11. 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 Left lower extremity DVT May 2018. Now maintained on Coumadin.    Disposition:  She appears  well. No clinical evidence for progression of the myeloma. We will follow-up on the myeloma panel from today. She will return for an office visit, Zometa, and an influenza vaccine in 7 weeks.  She continues close follow-up with Dr. Doylene Canard for  management of heart disease and Coumadin anticoagulation.  15 minutes were spent with the patient today. The majority of the time was used for counseling and coordination of care.  Abigail Romberg, MD  06/11/2017  4:27 PM

## 2017-06-11 NOTE — Telephone Encounter (Signed)
Gave patient avs report and appointments for October  °

## 2017-06-12 LAB — PROTEIN ELECTROPHORESIS, SERUM
A/G Ratio: 1.8 — ABNORMAL HIGH (ref 0.7–1.7)
ALBUMIN: 4.3 g/dL (ref 2.9–4.4)
ALPHA 1: 0.3 g/dL (ref 0.0–0.4)
ALPHA 2: 0.8 g/dL (ref 0.4–1.0)
Beta: 0.9 g/dL (ref 0.7–1.3)
GAMMA GLOBULIN: 0.5 g/dL (ref 0.4–1.8)
Globulin, Total: 2.4 g/dL (ref 2.2–3.9)
M-Spike, %: 0.1 g/dL — ABNORMAL HIGH
TOTAL PROTEIN: 6.7 g/dL (ref 6.0–8.5)

## 2017-06-12 LAB — KAPPA/LAMBDA LIGHT CHAINS
Ig Kappa Free Light Chain: 5.2 mg/L (ref 3.3–19.4)
Ig Lambda Free Light Chain: 8.9 mg/L (ref 5.7–26.3)
Kappa/Lambda FluidC Ratio: 0.58 (ref 0.26–1.65)

## 2017-06-12 LAB — IGG: IGG (IMMUNOGLOBIN G), SERUM: 620 mg/dL — AB (ref 700–1600)

## 2017-06-20 DIAGNOSIS — I249 Acute ischemic heart disease, unspecified: Secondary | ICD-10-CM

## 2017-06-20 HISTORY — DX: Acute ischemic heart disease, unspecified: I24.9

## 2017-06-23 ENCOUNTER — Observation Stay (HOSPITAL_COMMUNITY)
Admission: EM | Admit: 2017-06-23 | Discharge: 2017-06-25 | Disposition: A | Discharge status: Home / Self Care | Payer: Medicare HMO | Attending: Cardiovascular Disease | Admitting: Cardiovascular Disease

## 2017-06-23 ENCOUNTER — Emergency Department (HOSPITAL_COMMUNITY): Payer: Medicare HMO

## 2017-06-23 ENCOUNTER — Encounter (HOSPITAL_COMMUNITY): Payer: Self-pay | Admitting: Emergency Medicine

## 2017-06-23 DIAGNOSIS — Z955 Presence of coronary angioplasty implant and graft: Secondary | ICD-10-CM | POA: Diagnosis not present

## 2017-06-23 DIAGNOSIS — I5043 Acute on chronic combined systolic (congestive) and diastolic (congestive) heart failure: Secondary | ICD-10-CM

## 2017-06-23 DIAGNOSIS — I252 Old myocardial infarction: Secondary | ICD-10-CM | POA: Insufficient documentation

## 2017-06-23 DIAGNOSIS — I248 Other forms of acute ischemic heart disease: Secondary | ICD-10-CM | POA: Diagnosis not present

## 2017-06-23 DIAGNOSIS — Z882 Allergy status to sulfonamides status: Secondary | ICD-10-CM | POA: Diagnosis not present

## 2017-06-23 DIAGNOSIS — N289 Disorder of kidney and ureter, unspecified: Secondary | ICD-10-CM | POA: Diagnosis not present

## 2017-06-23 DIAGNOSIS — I249 Acute ischemic heart disease, unspecified: Secondary | ICD-10-CM | POA: Diagnosis present

## 2017-06-23 DIAGNOSIS — C9000 Multiple myeloma not having achieved remission: Secondary | ICD-10-CM | POA: Insufficient documentation

## 2017-06-23 DIAGNOSIS — F419 Anxiety disorder, unspecified: Secondary | ICD-10-CM | POA: Insufficient documentation

## 2017-06-23 DIAGNOSIS — Z8719 Personal history of other diseases of the digestive system: Secondary | ICD-10-CM | POA: Insufficient documentation

## 2017-06-23 DIAGNOSIS — I130 Hypertensive heart and chronic kidney disease with heart failure and stage 1 through stage 4 chronic kidney disease, or unspecified chronic kidney disease: Secondary | ICD-10-CM | POA: Diagnosis not present

## 2017-06-23 DIAGNOSIS — F329 Major depressive disorder, single episode, unspecified: Secondary | ICD-10-CM | POA: Diagnosis not present

## 2017-06-23 DIAGNOSIS — I5023 Acute on chronic systolic (congestive) heart failure: Secondary | ICD-10-CM | POA: Insufficient documentation

## 2017-06-23 DIAGNOSIS — R011 Cardiac murmur, unspecified: Secondary | ICD-10-CM | POA: Insufficient documentation

## 2017-06-23 DIAGNOSIS — R072 Precordial pain: Secondary | ICD-10-CM

## 2017-06-23 DIAGNOSIS — D630 Anemia in neoplastic disease: Secondary | ICD-10-CM | POA: Insufficient documentation

## 2017-06-23 DIAGNOSIS — Z7901 Long term (current) use of anticoagulants: Secondary | ICD-10-CM | POA: Diagnosis not present

## 2017-06-23 DIAGNOSIS — N183 Chronic kidney disease, stage 3 (moderate): Secondary | ICD-10-CM | POA: Insufficient documentation

## 2017-06-23 DIAGNOSIS — Z87891 Personal history of nicotine dependence: Secondary | ICD-10-CM | POA: Diagnosis not present

## 2017-06-23 DIAGNOSIS — K219 Gastro-esophageal reflux disease without esophagitis: Secondary | ICD-10-CM | POA: Insufficient documentation

## 2017-06-23 DIAGNOSIS — I25110 Atherosclerotic heart disease of native coronary artery with unstable angina pectoris: Secondary | ICD-10-CM | POA: Diagnosis not present

## 2017-06-23 DIAGNOSIS — E785 Hyperlipidemia, unspecified: Secondary | ICD-10-CM | POA: Diagnosis not present

## 2017-06-23 DIAGNOSIS — Z86718 Personal history of other venous thrombosis and embolism: Secondary | ICD-10-CM | POA: Insufficient documentation

## 2017-06-23 DIAGNOSIS — Z79899 Other long term (current) drug therapy: Secondary | ICD-10-CM | POA: Diagnosis not present

## 2017-06-23 DIAGNOSIS — I471 Supraventricular tachycardia: Secondary | ICD-10-CM | POA: Insufficient documentation

## 2017-06-23 DIAGNOSIS — R001 Bradycardia, unspecified: Secondary | ICD-10-CM | POA: Insufficient documentation

## 2017-06-23 DIAGNOSIS — I509 Heart failure, unspecified: Secondary | ICD-10-CM

## 2017-06-23 HISTORY — DX: Acute ischemic heart disease, unspecified: I24.9

## 2017-06-23 HISTORY — DX: Dyspnea, unspecified: R06.00

## 2017-06-23 LAB — BASIC METABOLIC PANEL
Anion gap: 9 (ref 5–15)
BUN: 16 mg/dL (ref 6–20)
CHLORIDE: 113 mmol/L — AB (ref 101–111)
CO2: 20 mmol/L — AB (ref 22–32)
CREATININE: 0.94 mg/dL (ref 0.44–1.00)
Calcium: 8.9 mg/dL (ref 8.9–10.3)
GFR calc Af Amer: >60 mL/min (ref >60)
GFR calc non Af Amer: >60 mL/min (ref >60)
GLUCOSE: 97 mg/dL (ref 65–99)
POTASSIUM: 4.2 mmol/L (ref 3.5–5.1)
Sodium: 142 mmol/L (ref 135–145)

## 2017-06-23 LAB — I-STAT TROPONIN, ED: Troponin i, poc: 0.02 ng/mL (ref 0.00–0.08)

## 2017-06-23 LAB — CBC
HEMATOCRIT: 32.9 % — AB (ref 36.0–46.0)
Hemoglobin: 10.1 g/dL — ABNORMAL LOW (ref 12.0–15.0)
MCH: 28.6 pg (ref 26.0–34.0)
MCHC: 30.7 g/dL (ref 30.0–36.0)
MCV: 93.2 fL (ref 78.0–100.0)
PLATELETS: 196 10*3/uL (ref 150–400)
RBC: 3.53 MIL/uL — ABNORMAL LOW (ref 3.87–5.11)
RDW: 16.3 % — AB (ref 11.5–15.5)
WBC: 4.5 10*3/uL (ref 4.0–10.5)

## 2017-06-23 LAB — TROPONIN I
TROPONIN I: 0.03 ng/mL — AB (ref <0.03)
Troponin I: 0.03 ng/mL (ref <0.03)

## 2017-06-23 LAB — BRAIN NATRIURETIC PEPTIDE: B NATRIURETIC PEPTIDE 5: 938 pg/mL — AB (ref 0.0–100.0)

## 2017-06-23 LAB — PROTIME-INR
INR: 2.49
PROTHROMBIN TIME: 26.7 s — AB (ref 11.4–15.2)

## 2017-06-23 MED ORDER — WARFARIN - PHYSICIAN DOSING INPATIENT: Freq: Every day | Status: DC

## 2017-06-23 MED ORDER — WARFARIN SODIUM 5 MG PO TABS
5.0000 mg | ORAL_TABLET | Freq: Every day | ORAL | Status: DC
  Administered 2017-06-23: 5 mg via ORAL
  Filled 2017-06-23: qty 1

## 2017-06-23 MED ORDER — FUROSEMIDE 10 MG/ML IJ SOLN
40.0000 mg | Freq: Once | INTRAMUSCULAR | Status: AC
  Administered 2017-06-23: 40 mg via INTRAVENOUS
  Filled 2017-06-23: qty 4

## 2017-06-23 MED ORDER — PYRIDOSTIGMINE BROMIDE 60 MG PO TABS
30.0000 mg | ORAL_TABLET | Freq: Two times a day (BID) | ORAL | Status: DC
  Administered 2017-06-23 – 2017-06-25 (×5): 30 mg via ORAL
  Filled 2017-06-23 (×5): qty 0.5

## 2017-06-23 MED ORDER — AMIODARONE HCL 100 MG PO TABS
100.0000 mg | ORAL_TABLET | Freq: Every day | ORAL | Status: DC
  Administered 2017-06-23 – 2017-06-25 (×3): 100 mg via ORAL
  Filled 2017-06-23 (×3): qty 1

## 2017-06-23 MED ORDER — ASPIRIN 81 MG PO CHEW: 324.0000 mg | CHEWABLE_TABLET | ORAL | Status: AC

## 2017-06-23 MED ORDER — ACETAMINOPHEN 325 MG PO TABS
650.0000 mg | ORAL_TABLET | ORAL | Status: DC | PRN
Start: 2017-06-23 — End: 2017-06-25
  Administered 2017-06-24: 650 mg via ORAL
  Filled 2017-06-23: qty 2

## 2017-06-23 MED ORDER — ONDANSETRON HCL 4 MG/2ML IJ SOLN: 4.0000 mg | Freq: Four times a day (QID) | INTRAMUSCULAR | Status: DC | PRN

## 2017-06-23 MED ORDER — FERROUS SULFATE 325 (65 FE) MG PO TABS
325.0000 mg | ORAL_TABLET | Freq: Every day | ORAL | Status: DC
  Administered 2017-06-24 – 2017-06-25 (×2): 325 mg via ORAL
  Filled 2017-06-23 (×2): qty 1

## 2017-06-23 MED ORDER — TICAGRELOR 90 MG PO TABS
90.0000 mg | ORAL_TABLET | Freq: Two times a day (BID) | ORAL | Status: DC
Start: 2017-06-23 — End: 2017-06-25
  Administered 2017-06-23 – 2017-06-25 (×5): 90 mg via ORAL
  Filled 2017-06-23 (×5): qty 1

## 2017-06-23 MED ORDER — ASPIRIN EC 81 MG PO TBEC
81.0000 mg | DELAYED_RELEASE_TABLET | Freq: Every day | ORAL | Status: DC
  Administered 2017-06-24 – 2017-06-25 (×2): 81 mg via ORAL
  Filled 2017-06-23 (×2): qty 1

## 2017-06-23 MED ORDER — NITROGLYCERIN 0.4 MG SL SUBL: 0.4000 mg | SUBLINGUAL_TABLET | SUBLINGUAL | Status: DC | PRN

## 2017-06-23 MED ORDER — MIRTAZAPINE 15 MG PO TABS
15.0000 mg | ORAL_TABLET | Freq: Every day | ORAL | Status: DC
  Administered 2017-06-23 – 2017-06-24 (×2): 15 mg via ORAL
  Filled 2017-06-23 (×2): qty 1

## 2017-06-23 MED ORDER — POTASSIUM CHLORIDE CRYS ER 10 MEQ PO TBCR
10.0000 meq | EXTENDED_RELEASE_TABLET | Freq: Two times a day (BID) | ORAL | Status: DC
  Administered 2017-06-23 – 2017-06-25 (×4): 10 meq via ORAL
  Filled 2017-06-23 (×5): qty 1

## 2017-06-23 MED ORDER — ASPIRIN 300 MG RE SUPP: 300.0000 mg | RECTAL | Status: AC

## 2017-06-23 MED ORDER — ATORVASTATIN CALCIUM 80 MG PO TABS
80.0000 mg | ORAL_TABLET | Freq: Every day | ORAL | Status: DC
  Administered 2017-06-23 – 2017-06-24 (×2): 80 mg via ORAL
  Filled 2017-06-23 (×2): qty 1

## 2017-06-23 MED ORDER — ASPIRIN 81 MG PO CHEW
324.0000 mg | CHEWABLE_TABLET | Freq: Once | ORAL | Status: AC
  Administered 2017-06-23: 324 mg via ORAL
  Filled 2017-06-23: qty 4

## 2017-06-23 MED ORDER — PANTOPRAZOLE SODIUM 40 MG PO TBEC
40.0000 mg | DELAYED_RELEASE_TABLET | Freq: Every day | ORAL | Status: DC
  Administered 2017-06-23 – 2017-06-25 (×3): 40 mg via ORAL
  Filled 2017-06-23 (×3): qty 1

## 2017-06-23 MED ORDER — TRAMADOL HCL 50 MG PO TABS
50.0000 mg | ORAL_TABLET | Freq: Four times a day (QID) | ORAL | Status: DC | PRN
  Administered 2017-06-23 – 2017-06-24 (×2): 50 mg via ORAL
  Filled 2017-06-23 (×2): qty 1

## 2017-06-23 MED ORDER — ENSURE ENLIVE PO LIQD: 237.0000 mL | Freq: Two times a day (BID) | ORAL | Status: DC

## 2017-06-23 NOTE — ED Triage Notes (Signed)
Pt reports sob and central chest pressure that began last night, pt tearful in triage but states pain is mild. Pt a/ox4. Cardiac hx per patient

## 2017-06-23 NOTE — H&P (Signed)
Referring Physician:  Abigail [name] is an 71 y.o. female.                       Chief Complaint: Chest pain and shortness of breath  HPI: 71 year old female with PMH of CAD, LAD and LCX stent 3-4 months ago has chest pain and shortness of breath with elevated BNP and pulmonary edema on chest x-ray. She feels better post lasix use in ER.  Past Medical History:  Diagnosis Date  . ACS (acute coronary syndrome) (HCC) 06/2017  . Anemia    "off and on all my life" (12/22/2016)  . Anxiety   . Bilateral renal artery stenosis (HCC) 1999   s/p stenting  . Chronic kidney disease (CKD), stage III (moderate)    /notes 12/22/2016  . Coronary artery disease 1999  . Depression   . Diverticulosis   . Duodenitis   . Dyspnea   . Gastric erosions   . Gastric ulcer   . GERD (gastroesophageal reflux disease)   . Heart murmur   . History of blood transfusion ~ 03/2016   "low HgB; practically nonexistent"  . Hyperlipidemia   . Hypertension   . MI (myocardial infarction) (HCC) 1999  . Multiple myeloma (HCC) dx'd ~ 04/2016  . PAT (paroxysmal atrial tachycardia) (HCC) 12/22/2016   /notes 12/22/2016  . Retinal hemorrhage, right eye   . Schatzki's ring   . Tachy-brady syndrome (HCC)    /notes 12/22/2016  . Tubular adenoma of colon       Past Surgical History:  Procedure Laterality Date  . APPENDECTOMY    . BACK SURGERY    . CATARACT EXTRACTION W/ INTRAOCULAR LENS IMPLANT Right 2014  . CORONARY ANGIOGRAPHY N/A 01/23/2017   Procedure: Coronary Angiography;  Surgeon: Ajay Kadakia, MD;  Location: [facility];  Service: Cardiovascular;  Laterality: N/A;  . CORONARY ANGIOPLASTY WITH STENT PLACEMENT  1999  . CORONARY STENT INTERVENTION N/A 12/29/2016   Procedure: Coronary Stent Intervention;  Surgeon: Mohan Harwani, MD;  Location: [facility];  Service: Cardiovascular;  Laterality: N/A;  . CORONARY STENT INTERVENTION N/A 02/24/2017   Procedure: Coronary Stent Intervention;  Surgeon: Harwani, Mohan, MD;   Location: [facility];  Service: Cardiovascular;  Laterality: N/A;  . ESOPHAGOGASTRODUODENOSCOPY N/A 11/03/2015   Procedure: ESOPHAGOGASTRODUODENOSCOPY (EGD);  Surgeon: Patrick Hung, MD;  Location: [facility];  Service: Endoscopy;  Laterality: N/A;  . ESOPHAGOGASTRODUODENOSCOPY (EGD) WITH PROPOFOL N/A 11/18/2016   Procedure: ESOPHAGOGASTRODUODENOSCOPY (EGD) WITH PROPOFOL;  Surgeon: Malcolm T Stark, MD;  Location: [facility];  Service: Endoscopy;  Laterality: N/A;  . LEFT HEART CATH AND CORONARY ANGIOGRAPHY N/A 12/24/2016   Procedure: Left Heart Cath and Coronary Angiography;  Surgeon: Ajay Kadakia, MD;  Location: [facility];  Service: Cardiovascular;  Laterality: N/A;  . LEFT HEART CATH AND CORONARY ANGIOGRAPHY N/A 02/24/2017   Procedure: Left Heart Cath and Coronary Angiography;  Surgeon: Harwani, Mohan, MD;  Location: [facility];  Service: Cardiovascular;  Laterality: N/A;  . LEFT HEART CATH AND CORONARY ANGIOGRAPHY N/A 03/05/2017   Procedure: Left Heart Cath and Coronary Angiography;  Surgeon: Kadakia, Ajay, MD;  Location: [facility];  Service: Cardiovascular;  Laterality: N/A;  . LUMBAR DISC SURGERY    . RENAL ARTERY STENT  1999   bil RAS so ? bil vs unilateral stents  . RETINAL LASER PROCEDURE Right 2012   "bleeding"  . TONSILLECTOMY    . TOTAL ABDOMINAL   HYSTERECTOMY      Family History  Problem Relation Age of Onset  . Breast cancer Mother   . Breast cancer Sister   . Breast cancer Maternal Aunt   . Colon cancer Neg Hx    Social History:  reports that she quit smoking about 7 months ago. Her smoking use included Cigarettes. She has a 25.00 pack-year smoking history. She has never used smokeless tobacco. She reports that she does not drink alcohol or use drugs.  Allergies:  Allergies  Allergen Reactions  . Sulfa Antibiotics Rash    Medications Prior to Admission  Medication Sig Dispense Refill  . acyclovir (ZOVIRAX) 400 MG tablet TAKE 1 TABLET  (400 MG TOTAL) BY MOUTH 2 (TWO) TIMES DAILY. 60 tablet 2  . amiodarone (PACERONE) 200 MG tablet Take 0.5 tablets (100 mg total) by mouth daily.    . amoxicillin (AMOXIL) 500 MG capsule Take 500 mg by mouth 3 (three) times daily.    . Ascorbic Acid (VITAMIN C) 1000 MG tablet Take 1,000 mg by mouth daily.     . atorvastatin (LIPITOR) 80 MG tablet Take 1 tablet (80 mg total) by mouth daily at 6 PM. 30 tablet 3  . calcium carbonate (OS-CAL - DOSED IN MG OF ELEMENTAL CALCIUM) 1250 (500 Ca) MG tablet Take 1 tablet by mouth daily with breakfast.    . Cholecalciferol (VITAMIN D PO) Take 1 capsule by mouth daily.    . ferrous sulfate 325 (65 FE) MG tablet Take 1 tablet (325 mg total) by mouth daily with breakfast. 30 tablet 3  . furosemide (LASIX) 40 MG tablet Take 1 tablet (40 mg total) by mouth every Monday, Wednesday, and Friday. (Patient taking differently: Take 40 mg by mouth as needed. For edema) 30 tablet 1  . mirtazapine (REMERON) 15 MG tablet Take 1 tablet (15 mg total) by mouth at bedtime. 30 tablet 3  . nitroGLYCERIN (NITROSTAT) 0.4 MG SL tablet Place 0.4 mg under the tongue every 5 (five) minutes as needed for chest pain.  1  . pantoprazole (PROTONIX) 40 MG tablet Take 1 tablet (40 mg total) by mouth daily. 30 tablet 2  . polyethylene glycol (MIRALAX / GLYCOLAX) packet Take 17 g by mouth daily as needed for moderate constipation.     . potassium chloride (K-DUR,KLOR-CON) 10 MEQ tablet Take 1 tablet (10 mEq total) by mouth 2 (two) times daily. 60 tablet 1  . pyridostigmine (MESTINON) 60 MG tablet Take 0.5 tablets (30 mg total) by mouth 2 (two) times daily. 30 tablet 3  . ticagrelor (BRILINTA) 90 MG TABS tablet Take 1 tablet (90 mg total) by mouth 2 (two) times daily. 60 tablet 3  . traMADol (ULTRAM) 50 MG tablet Take 50 mg by mouth at bedtime as needed for pain.  0  . warfarin (COUMADIN) 5 MG tablet Take 1 tablet (5 mg total) by mouth daily at 6 PM. 5 mg. M-W-F and 2.5 mg. On T-Th-Sat-Sun.  (Patient taking differently: Take 5 mg by mouth daily at 6 PM. 1 tablet (5mg) all days except on Sun no Coumadin)      Results for orders placed or performed during the hospital encounter of 06/23/17 (from the past 48 hour(s))  Basic metabolic panel     Status: Abnormal   Collection Time: 06/23/17  7:20 AM  Result Value Ref Range   Sodium 142 135 - 145 mmol/L   Potassium 4.2 3.5 - 5.1 mmol/L   Chloride 113 (H) 101 - 111 mmol/L     CO2 20 (L) 22 - 32 mmol/L   Glucose, Bld 97 65 - 99 mg/dL   BUN 16 6 - 20 mg/dL   Creatinine, Ser 0.94 0.44 - 1.00 mg/dL   Calcium 8.9 8.9 - 10.3 mg/dL   GFR calc non Af Amer >60 >60 mL/min   GFR calc Af Amer >60 >60 mL/min    Comment: (NOTE) The eGFR has been calculated using the CKD EPI equation. This calculation has not been validated in all clinical situations. eGFR's persistently <60 mL/min signify possible Chronic Kidney Disease.    Anion gap 9 5 - 15  CBC     Status: Abnormal   Collection Time: 06/23/17  7:20 AM  Result Value Ref Range   WBC 4.5 4.0 - 10.5 K/uL   RBC 3.53 (L) 3.87 - 5.11 MIL/uL   Hemoglobin 10.1 (L) 12.0 - 15.0 g/dL   HCT 32.9 (L) 36.0 - 46.0 %   MCV 93.2 78.0 - 100.0 fL   MCH 28.6 26.0 - 34.0 pg   MCHC 30.7 30.0 - 36.0 g/dL   RDW 16.3 (H) 11.5 - 15.5 %   Platelets 196 150 - 400 K/uL  I-stat troponin, ED     Status: None   Collection Time: 06/23/17  8:06 AM  Result Value Ref Range   Troponin i, poc 0.02 0.00 - 0.08 ng/mL   Comment 3            Comment: Due to the release kinetics of cTnI, a negative result within the first hours of the onset of symptoms does not rule out myocardial infarction with certainty. If myocardial infarction is still suspected, repeat the test at appropriate intervals.   Brain natriuretic peptide     Status: Abnormal   Collection Time: 06/23/17  9:30 AM  Result Value Ref Range   B Natriuretic Peptide 938.0 (H) 0.0 - 100.0 pg/mL  Protime-INR     Status: Abnormal   Collection Time: 06/23/17  10:36 AM  Result Value Ref Range   Prothrombin Time 26.7 (H) 11.4 - 15.2 seconds   INR 2.49   Troponin I     Status: Abnormal   Collection Time: 06/23/17 12:32 PM  Result Value Ref Range   Troponin I 0.03 (HH) <0.03 ng/mL    Comment: CRITICAL RESULT CALLED TO, READ BACK BY AND VERIFIED WITH: H.MORRISON,RN 06/23/17 1328 BY BSLADE    Dg Chest 2 View  Result Date: 06/23/2017 CLINICAL DATA:  71 year old hypertensive female with recent heart attack. Chest pain. Shortness breath. Initial encounter. EXAM: CHEST  2 VIEW COMPARISON:  04/27/2017 and 12/06/2016. FINDINGS: Pulmonary vascular congestion/ mild pulmonary edema (Kerley B lines, fissure thickening and peribronchial thickening without significant cephalization of vasculature). On lateral view, opacity posterior sulcus possibly confluence of shadows although cannot exclude infiltrate or mass. Attention to this on follow-up. Heart top-normal to slightly enlarged. Prominent coronary artery calcifications. Calcified slightly tortuous aorta. No pneumothorax. Bilateral acromioclavicular joint degenerative changes greater on the right. Remote left sixth rib fracture. Scoliosis thoracic spine without focal compression fracture No free air. IMPRESSION: Pulmonary vascular congestion/ mild pulmonary edema. On lateral view, opacity posterior sulcus possibly represents confluence of shadows although cannot exclude infiltrate or mass. Attention to this on follow-up. Heart top-normal to slightly enlarged. Prominent coronary artery calcifications. Calcified slightly tortuous aorta. Aortic Atherosclerosis (ICD10-I70.0). Electronically Signed   By: Genia Del M.D.   On: 06/23/2017 07:52    Review Of Systems Constitutional: No fever, chills, weight loss or gain. Eyes: No vision  change, wears glasses. No discharge or pain. Ears: No hearing loss, No tinnitus. Respiratory: No asthma, COPD, pneumonias. Positive shortness of breath. No hemoptysis. Cardiovascular:  Positive chest pain, palpitation, leg edema. Gastrointestinal: No nausea, vomiting, diarrhea, constipation. No GI bleed. No hepatitis. Genitourinary: No dysuria, hematuria, kidney stone. No incontinance. Neurological: Positive headache, no stroke, seizures.  Psychiatry: No psych facility admission for anxiety, depression, suicide. No detox. Skin: No rash. Musculoskeletal: Positive joint pain, fibromyalgia. No neck pain, back pain. Lymphadenopathy: No lymphadenopathy. Hematology: Positive anemia or easy bruising.   Blood pressure (!) 149/85, pulse (!) 58, temperature 99.1 F (37.3 C), temperature source Oral, resp. rate 16, height 5' 5" (1.651 m), weight 66.3 kg (146 lb 1.6 oz), SpO2 99 %. Body mass index is 24.31 kg/m. General appearance: alert, cooperative, appears stated age and no distress Head: Normocephalic, atraumatic. Eyes: Brown eyes, pink conjunctiva, corneas clear. PERRL, EOM's intact. Neck: No adenopathy, no carotid bruit, Positive JVD, supple, symmetrical, trachea midline and thyroid not enlarged. Resp: Clear to auscultation bilaterally. Cardio: Regular rate and rhythm, S1, S2 normal, II/VI systolic murmur, no click, rub or gallop GI: Soft, non-tender; bowel sounds normal; no organomegaly. Extremities: Trace edema, no cyanosis or clubbing. Skin: Warm and dry.  Neurologic: Alert and oriented X 3, normal strength. Normal coordination and gait.  Assessment/Plan Acute on chronic left heart systolic failure Chest pain r/o MI S/P DVT S/P stent in LAD and LCx Multiple myeloma Anemia of blood loss Small apical thrombus in LV.  Admit IV lasix. Home medications  Han Lysne S, MD  06/23/2017, 6:26 PM

## 2017-06-23 NOTE — ED Notes (Signed)
Cardiology at bedside.

## 2017-06-23 NOTE — ED Provider Notes (Signed)
[facility] DEPT Provider Note   CSN: [id number] Arrival date & time: 06/23/17  0715     History   Chief Complaint Chief Complaint  Patient presents with  . Chest Pain  . Shortness of Breath    HPI Abigail [name] is a 71 y.o. female.  HPI Patient states that at 5 AM this morning she developed central chest tightness and shortness of breath especially with lying flat. She is having some pain in her left arm and tingling sensation in her hands. States the pain in her chest is roughly 3/10 in severity. Denies cough, fever or chills. Has taken no medications prior to arrival. Past Medical History:  Diagnosis Date  . Anemia    "off and on all my life" (12/22/2016)  . Anxiety   . Bilateral renal artery stenosis ([place]) 1999   s/p stenting  . Chronic kidney disease (CKD), stage III (moderate)    Archie Endo 12/22/2016  . Coronary artery disease 1999  . Depression   . Diverticulosis   . Duodenitis   . Gastric erosions   . Gastric ulcer   . GERD (gastroesophageal reflux disease)   . Heart murmur   . History of blood transfusion ~ 03/2016   "low HgB; practically nonexistent"  . Hyperlipidemia   . Hypertension   . MI (myocardial infarction) ([place]) 1999  . Multiple myeloma ([place]) dx'd ~ 04/2016  . PAT (paroxysmal atrial tachycardia) ([place]) 12/22/2016   Archie Endo 12/22/2016  . Retinal hemorrhage, right eye   . Schatzki's ring   . Tachy-brady syndrome ([place])    Archie Endo 12/22/2016  . Tubular adenoma of colon     Patient Active Problem List   Diagnosis Date Noted  . STEMI (ST elevation myocardial infarction) ([place]) 02/24/2017  . Acute anterolateral wall MI ([place]) 02/24/2017  . Dehydration 01/21/2017  . Acute coronary syndrome ([place]) 12/24/2016  . Dizziness 12/22/2016    Class: Acute  . Orthostatic hypotension 12/08/2016  . Syncope 12/08/2016  . Syncope and collapse 12/06/2016  . Tachycardia-bradycardia ([place])   . Essential hypertension   . Nausea and vomiting in adult patient   . Erosive  esophagitis   . Protein-calorie malnutrition, severe 11/19/2016  . LLQ pain   . LUQ abdominal pain   . Intractable nausea and vomiting 11/17/2016  . Abdominal pain 11/05/2016  . Multiple myeloma ([place]) 06/18/2016  . Malnutrition of moderate degree 06/14/2016  . 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 Exertional dyspnea 11/01/2015  . Coronary atherosclerosis of native coronary artery 11/01/2015  . Shortness of breath 11/01/2015  . Chest pain 12/31/2011    Past Surgical History:  Procedure Laterality Date  . APPENDECTOMY    . BACK SURGERY    . CATARACT EXTRACTION W/ INTRAOCULAR LENS IMPLANT Right 2014  . CORONARY ANGIOGRAPHY N/A 01/23/2017   Procedure: Coronary Angiography;  Surgeon: [name], MD;  Location: [facility];  Service: Cardiovascular;  Laterality: N/A;  . CORONARY ANGIOPLASTY WITH STENT PLACEMENT  1999  . CORONARY STENT INTERVENTION N/A 12/29/2016   Procedure: Coronary Stent Intervention;  Surgeon: Charolette Forward, MD;  Location: [facility];  Service: Cardiovascular;  Laterality: N/A;  . CORONARY STENT INTERVENTION N/A 02/24/2017   Procedure: Coronary Stent Intervention;  Surgeon: Charolette Forward, MD;  Location: [facility];  Service: Cardiovascular;  Laterality: N/A;  . ESOPHAGOGASTRODUODENOSCOPY N/A 11/03/2015   Procedure: ESOPHAGOGASTRODUODENOSCOPY (EGD);  Surgeon: Carol Ada, MD;  Location: [facility];  Service: Endoscopy;  Laterality: N/A;  .  ESOPHAGOGASTRODUODENOSCOPY (EGD) WITH PROPOFOL N/A 11/18/2016   Procedure: ESOPHAGOGASTRODUODENOSCOPY (EGD) WITH PROPOFOL;  Surgeon: Ladene Artist, MD;  Location: [facility];  Service: Endoscopy;  Laterality: N/A;  . LEFT HEART CATH AND CORONARY ANGIOGRAPHY N/A 12/24/2016   Procedure: Left Heart Cath and Coronary Angiography;  Surgeon: [name], MD;  Location: [facility];  Service: Cardiovascular;  Laterality: N/A;  . LEFT HEART CATH AND CORONARY ANGIOGRAPHY N/A  02/24/2017   Procedure: Left Heart Cath and Coronary Angiography;  Surgeon: Charolette Forward, MD;  Location: [facility];  Service: Cardiovascular;  Laterality: N/A;  . LEFT HEART CATH AND CORONARY ANGIOGRAPHY N/A 03/05/2017   Procedure: Left Heart Cath and Coronary Angiography;  Surgeon: [name], MD;  Location: [facility];  Service: Cardiovascular;  Laterality: N/A;  . LUMBAR [place]    . RENAL ARTERY STENT  1999   bil RAS so ? bil vs unilateral stents  . RETINAL LASER PROCEDURE Right 2012   "bleeding"  . TONSILLECTOMY    . TOTAL ABDOMINAL HYSTERECTOMY      OB History    No data available       Home Medications    Prior to Admission medications   Medication Sig Start Date End Date Taking? Authorizing Provider  acyclovir (ZOVIRAX) 400 MG tablet TAKE 1 TABLET (400 MG TOTAL) BY MOUTH 2 (TWO) TIMES DAILY. 08/29/16   Ladell Pier, MD  amiodarone (PACERONE) 200 MG tablet Take 0.5 tablets (100 mg total) by mouth daily. 03/12/17   [name], MD  Ascorbic Acid (VITAMIN C) 1000 MG tablet Take 1,000 mg by mouth daily.     [provider]  atorvastatin (LIPITOR) 80 MG tablet Take 1 tablet (80 mg total) by mouth daily at 6 PM. 03/01/17   [name], MD  calcium carbonate (OS-CAL - DOSED IN MG OF ELEMENTAL CALCIUM) 1250 (500 Ca) MG tablet Take 1 tablet by mouth daily with breakfast.    [provider]  Cholecalciferol (VITAMIN D PO) Take 1 capsule by mouth daily.    [provider]  feeding supplement, ENSURE ENLIVE, (ENSURE ENLIVE) LIQD Take 237 mLs by mouth 2 (two) times daily between meals. Patient taking differently: Take 237 mLs by mouth as needed (low appetite).  11/27/16   Mariel Aloe, MD  ferrous sulfate 325 (65 FE) MG tablet Take 1 tablet (325 mg total) by mouth daily with breakfast. 03/02/17   [name], MD  furosemide (LASIX) 40 MG tablet Take 1 tablet (40 mg total) by mouth every Monday, Wednesday, and Friday. Patient  taking differently: Take 40 mg by mouth as needed. For edema 04/27/17   [name], MD  mirtazapine (REMERON) 15 MG tablet Take 1 tablet (15 mg total) by mouth at bedtime. Patient taking differently: Take 30 mg by mouth at bedtime.  12/27/16   [name], MD  nitroGLYCERIN (NITROSTAT) 0.4 MG SL tablet Place 0.4 mg under the tongue every 5 (five) minutes as needed for chest pain. 03/20/17   [provider]  pantoprazole (PROTONIX) 40 MG tablet Take 1 tablet (40 mg total) by mouth daily. 03/12/17   [name], MD  polyethylene glycol (MIRALAX / GLYCOLAX) packet Take 17 g by mouth daily as needed for moderate constipation.     [provider]  potassium chloride (K-DUR,KLOR-CON) 10 MEQ tablet Take 1 tablet (10 mEq total) by mouth 2 (two) times daily. 04/27/17   [name], MD  pyridostigmine (MESTINON) 60 MG tablet Take  0.5 tablets (30 mg total) by mouth 2 (two) times daily. 01/25/17   [name], MD  ticagrelor (BRILINTA) 90 MG TABS tablet Take 1 tablet (90 mg total) by mouth 2 (two) times daily. 12/27/16   [name], MD  traMADol (ULTRAM) 50 MG tablet Take 50 mg by mouth at bedtime as needed for pain. 03/25/17   [provider]  warfarin (COUMADIN) 5 MG tablet Take 1 tablet (5 mg total) by mouth daily at 6 PM. 5 mg. M-W-F and 2.5 mg. On T-Th-Sat-Sun. Patient taking differently: Take 5 mg by mouth daily at 6 PM. 1 tablet (73m) all days except on Sun no Coumadin 04/08/17   KDixie Dials MD    Family History Family History  Problem Relation Age of Onset  . Breast cancer Mother   . Breast cancer Sister   . Breast cancer Maternal Aunt   . Colon cancer Neg Hx     Social History Social History  Substance Use Topics  . Smoking status: Former Smoker    Packs/day: 0.50    Years: 50.00    Types: Cigarettes    Quit date: 11/20/2016  . Smokeless tobacco: Never Used  . Alcohol use No     Allergies   Sulfa antibiotics   Review of Systems Review of Systems    Constitutional: Negative for chills and fever.  HENT: Negative for facial swelling.   Eyes: Negative for visual disturbance.  Respiratory: Positive for chest tightness and shortness of breath. Negative for cough.   Cardiovascular: Positive for chest pain and leg swelling. Negative for palpitations.  Gastrointestinal: Negative for abdominal pain, diarrhea, nausea and vomiting.  Genitourinary: Negative for dysuria, flank pain and frequency.  Musculoskeletal: Negative for back pain, myalgias and neck pain.  Skin: Negative for rash and wound.  Neurological: Positive for numbness. Negative for dizziness, weakness, light-headedness and headaches.  All other systems reviewed and are negative.    Physical Exam Updated Vital Signs BP (!) 112/40 (BP Location: Right Arm)   Pulse (!) 45   Temp 98.4 F (36.9 C) (Oral)   Resp 17   SpO2 100%   Physical Exam  Constitutional: She is oriented to person, place, and time. She appears well-developed and well-nourished. No distress.  HENT:  Head: Normocephalic and atraumatic.  Mouth/Throat: Oropharynx is clear and moist.  Eyes: Pupils are equal, round, and reactive to light. EOM are normal.  Neck: Normal range of motion. Neck supple. JVD present.  Cardiovascular: Normal rate and regular rhythm.  Exam reveals no gallop and no friction rub.   No murmur heard. Pulmonary/Chest: Effort normal. She has rales.  Few crackles in bilateral bases. Patient is in no respiratory distress.  Abdominal: Soft. Bowel sounds are normal. There is no tenderness. There is no rebound and no guarding.  Musculoskeletal: Normal range of motion. She exhibits edema. She exhibits no tenderness.  1+ bilateral lower extremity pitting edema. Distal pulses intact.  Neurological: She is alert and oriented to person, place, and time.  Moving all extremities without focal deficit. Sensation fully intact.  Skin: Skin is warm and dry. No rash noted. No erythema.  Psychiatric: She has  a normal mood and affect. Her behavior is normal.  Nursing note and vitals reviewed.    ED Treatments / Results  Labs (all labs ordered are listed, but only abnormal results are displayed) Labs Reviewed  BASIC METABOLIC PANEL - Abnormal; Notable for the following:       Result Value   Chloride 113 (*)  CO2 20 (*)    All other components within normal limits  CBC - Abnormal; Notable for the following:    RBC 3.53 (*)    Hemoglobin 10.1 (*)    HCT 32.9 (*)    RDW 16.3 (*)    All other components within normal limits  BRAIN NATRIURETIC PEPTIDE - Abnormal; Notable for the following:    B Natriuretic Peptide 938.0 (*)    All other components within normal limits  PROTIME-INR - Abnormal; Notable for the following:    Prothrombin Time 26.7 (*)    All other components within normal limits  I-STAT TROPONIN, ED    EKG  EKG Interpretation  Date/Time:  Tuesday June 23 2017 07:17:03 EDT Ventricular Rate:  60 PR Interval:  176 QRS Duration: 76 QT Interval:  436 QTC Calculation: 436 R Axis:   2 Text Interpretation:  Normal sinus rhythm Indeterminate axis Septal infarct , age undetermined Abnormal ECG Confirmed by Lita Mains  MD, Carnelia Oscar ([id number]) on 06/23/2017 9:05:42 AM       Radiology Dg Chest 2 View  Result Date: 06/23/2017 CLINICAL DATA:  71 year old hypertensive female with recent heart attack. Chest pain. Shortness breath. Initial encounter. EXAM: CHEST  2 VIEW COMPARISON:  04/27/2017 and 12/06/2016. FINDINGS: Pulmonary vascular congestion/ mild pulmonary edema (Kerley B lines, fissure thickening and peribronchial thickening without significant cephalization of vasculature). On lateral view, opacity posterior sulcus possibly confluence of shadows although cannot exclude infiltrate or mass. Attention to this on follow-up. Heart top-normal to slightly enlarged. Prominent coronary artery calcifications. Calcified slightly tortuous aorta. No pneumothorax. Bilateral acromioclavicular  joint degenerative changes greater on the right. Remote left sixth rib fracture. Scoliosis thoracic spine without focal compression fracture No free air. IMPRESSION: Pulmonary vascular congestion/ mild pulmonary edema. On lateral view, opacity posterior sulcus possibly represents confluence of shadows although cannot exclude infiltrate or mass. Attention to this on follow-up. Heart top-normal to slightly enlarged. Prominent coronary artery calcifications. Calcified slightly tortuous aorta. Aortic Atherosclerosis (ICD10-I70.0). Electronically Signed   By: Genia Del M.D.   On: 06/23/2017 07:52    Procedures Procedures (including critical care time)  Medications Ordered in ED Medications  nitroGLYCERIN (NITROSTAT) SL tablet 0.4 mg (not administered)  aspirin chewable tablet 324 mg (324 mg Oral Given 06/23/17 0933)  furosemide (LASIX) injection 40 mg (40 mg Intravenous Given 06/23/17 1033)     Initial Impression / Assessment and Plan / ED Course  I have reviewed the triage vital signs and the nursing notes.  Pertinent labs & imaging results that were available during my care of the patient were reviewed by me and considered in my medical decision making (see chart for details).     Patient is now chest pain-free after nitroglycerin. Given aspirin and IV Lasix. Discussed with Dr. Doylene Canard  who will see patient in the emergency department and admit.  Final Clinical Impressions(s) / ED Diagnoses   Final diagnoses:  Acute on chronic combined systolic and diastolic congestive heart failure (HCC)  Precordial pain    New Prescriptions New Prescriptions   No medications on file     Julianne Rice, MD 06/23/17 1135

## 2017-06-23 NOTE — Progress Notes (Signed)
Clarified warfarin dosing with Dr. Doylene Canard. He will be following the dosing.

## 2017-06-23 NOTE — ED Notes (Signed)
Attempted report x1. 

## 2017-06-24 ENCOUNTER — Other Ambulatory Visit: Payer: Self-pay

## 2017-06-24 DIAGNOSIS — I5023 Acute on chronic systolic (congestive) heart failure: Secondary | ICD-10-CM | POA: Diagnosis not present

## 2017-06-24 DIAGNOSIS — I248 Other forms of acute ischemic heart disease: Secondary | ICD-10-CM | POA: Diagnosis not present

## 2017-06-24 DIAGNOSIS — I130 Hypertensive heart and chronic kidney disease with heart failure and stage 1 through stage 4 chronic kidney disease, or unspecified chronic kidney disease: Secondary | ICD-10-CM | POA: Diagnosis not present

## 2017-06-24 DIAGNOSIS — I25110 Atherosclerotic heart disease of native coronary artery with unstable angina pectoris: Secondary | ICD-10-CM | POA: Diagnosis not present

## 2017-06-24 LAB — CBC
HCT: 33.5 % — ABNORMAL LOW (ref 36.0–46.0)
Hemoglobin: 10.6 g/dL — ABNORMAL LOW (ref 12.0–15.0)
MCH: 28.3 pg (ref 26.0–34.0)
MCHC: 31.6 g/dL (ref 30.0–36.0)
MCV: 89.3 fL (ref 78.0–100.0)
Platelets: 210 10*3/uL (ref 150–400)
RBC: 3.75 MIL/uL — ABNORMAL LOW (ref 3.87–5.11)
RDW: 15.9 % — AB (ref 11.5–15.5)
WBC: 4 10*3/uL (ref 4.0–10.5)

## 2017-06-24 LAB — BASIC METABOLIC PANEL
ANION GAP: 11 (ref 5–15)
BUN: 24 mg/dL — AB (ref 6–20)
CALCIUM: 9.3 mg/dL (ref 8.9–10.3)
CO2: 22 mmol/L (ref 22–32)
Chloride: 107 mmol/L (ref 101–111)
Creatinine, Ser: 1.15 mg/dL — ABNORMAL HIGH (ref 0.44–1.00)
GFR calc Af Amer: 55 mL/min — ABNORMAL LOW (ref >60)
GFR, EST NON AFRICAN AMERICAN: 47 mL/min — AB (ref >60)
GLUCOSE: 90 mg/dL (ref 65–99)
Potassium: 3.7 mmol/L (ref 3.5–5.1)
SODIUM: 140 mmol/L (ref 135–145)

## 2017-06-24 LAB — TSH: TSH: 0.844 u[IU]/mL (ref 0.350–4.500)

## 2017-06-24 LAB — LIPID PANEL
CHOLESTEROL: 191 mg/dL (ref 0–200)
HDL: 93 mg/dL (ref >40)
LDL Cholesterol: 66 mg/dL (ref 0–99)
Total CHOL/HDL Ratio: 2.1 RATIO
Triglycerides: 158 mg/dL — ABNORMAL HIGH (ref <150)
VLDL: 32 mg/dL (ref 0–40)

## 2017-06-24 LAB — PROTIME-INR
INR: 2.08
PROTHROMBIN TIME: 23.2 s — AB (ref 11.4–15.2)

## 2017-06-24 LAB — TROPONIN I: TROPONIN I: 0.03 ng/mL — AB (ref <0.03)

## 2017-06-24 LAB — T4, FREE: FREE T4: 1.23 ng/dL — AB (ref 0.61–1.12)

## 2017-06-24 MED ORDER — FUROSEMIDE 40 MG PO TABS
40.0000 mg | ORAL_TABLET | Freq: Every day | ORAL | Status: DC
  Administered 2017-06-24: 40 mg via ORAL
  Filled 2017-06-24 (×2): qty 1

## 2017-06-24 MED ORDER — WARFARIN SODIUM 7.5 MG PO TABS
7.5000 mg | ORAL_TABLET | Freq: Once | ORAL | Status: AC
  Administered 2017-06-24: 7.5 mg via ORAL
  Filled 2017-06-24: qty 1

## 2017-06-24 NOTE — Progress Notes (Signed)
Ref: Nolene Ebbs, MD   Subjective:  Feeling better. VS are stable. 99 % o2 sat at rest and 88-90 % with 50 feet walk returns to 99 % in less than 1 minute of rest.  Objective:  Vital Signs in the last 24 hours: Temp:  [98.4 F (36.9 C)-99.1 F (37.3 C)] 98.4 F (36.9 C) (09/05 0824) Pulse Rate:  [44-60] 50 (09/05 0824) Cardiac Rhythm: Sinus bradycardia (09/05 0700) Resp:  [12-19] 16 (09/05 0824) BP: (112-183)/(40-99) 120/60 (09/05 0824) SpO2:  [96 %-100 %] 99 % (09/05 0824) Weight:  [65.8 kg (145 lb)-66.3 kg (146 lb 1.6 oz)] 65.8 kg (145 lb) (09/05 7939)  Physical Exam: BP Readings from Last 1 Encounters:  06/24/17 120/60    Wt Readings from Last 1 Encounters:  06/24/17 65.8 kg (145 lb)    Weight change:  Body mass index is 24.13 kg/m. HEENT: [place]/AT, Eyes-Brown, PERL, EOMI, Conjunctiva-Pink, Sclera-Non-icteric Neck: No JVD at 90 degree angle, No bruit, Trachea midline. Lungs:  Clear, Bilateral. Cardiac:  Regular rhythm, normal S1 and S2, no S3. II/VI systolic murmur. Abdomen:  Soft, non-tender. BS present. Extremities:  No edema present. No cyanosis. No clubbing. CNS: AxOx3, Cranial nerves grossly intact, moves all 4 extremities.  Skin: Warm and dry.   Intake/Output from previous day: 09/04 0701 - 09/05 0700 In: 240 [P.O.:240] Out: 1850 [Urine:1850]    Lab Results: BMET    Component Value Date/Time   NA 140 06/24/2017 0545   NA 142 06/23/2017 0720   NA 142 06/11/2017 1451   NA 143 04/27/2017 0511   NA 141 04/09/2017 1357   NA 143 02/12/2017 1035   K 3.7 06/24/2017 0545   K 4.2 06/23/2017 0720   K 4.3 06/11/2017 1451   K 3.3 (L) 04/27/2017 0511   K 4.3 04/09/2017 1357   K 3.8 02/12/2017 1035   CL 107 06/24/2017 0545   CL 113 (H) 06/23/2017 0720   CL 116 (H) 04/27/2017 0511   CO2 22 06/24/2017 0545   CO2 20 (L) 06/23/2017 0720   CO2 20 (L) 06/11/2017 1451   CO2 18 (L) 04/27/2017 0511   CO2 22 04/09/2017 1357   CO2 23 02/12/2017 1035   GLUCOSE 90  06/24/2017 0545   GLUCOSE 97 06/23/2017 0720   GLUCOSE 74 06/11/2017 1451   GLUCOSE 93 04/27/2017 0511   GLUCOSE 90 04/09/2017 1357   GLUCOSE 106 02/12/2017 1035   BUN 24 (H) 06/24/2017 0545   BUN 16 06/23/2017 0720   BUN 18.7 06/11/2017 1451   BUN 12 04/27/2017 0511   BUN 23.4 04/09/2017 1357   BUN 20.6 02/12/2017 1035   CREATININE 1.15 (H) 06/24/2017 0545   CREATININE 0.94 06/23/2017 0720   CREATININE 1.2 (H) 06/11/2017 1451   CREATININE 0.82 04/27/2017 0511   CREATININE 1.0 04/09/2017 1357   CREATININE 1.0 02/12/2017 1035   CALCIUM 9.3 06/24/2017 0545   CALCIUM 8.9 06/23/2017 0720   CALCIUM 9.6 06/11/2017 1451   CALCIUM 8.9 04/27/2017 0511   CALCIUM 10.2 04/09/2017 1357   CALCIUM 9.3 02/12/2017 1035   GFRNONAA 47 (L) 06/24/2017 0545   GFRNONAA >60 06/23/2017 0720   GFRNONAA >60 04/27/2017 0511   GFRAA 55 (L) 06/24/2017 0545   GFRAA >60 06/23/2017 0720   GFRAA >60 04/27/2017 0511   CBC    Component Value Date/Time   WBC 4.0 06/24/2017 0545   RBC 3.75 (L) 06/24/2017 0545   HGB 10.6 (L) 06/24/2017 0545   HGB 11.0 (L) 06/11/2017 1451  HCT 33.5 (L) 06/24/2017 0545   HCT 34.6 (L) 06/11/2017 1451   PLT 210 06/24/2017 0545   PLT 180 06/11/2017 1451   MCV 89.3 06/24/2017 0545   MCV 93.2 06/11/2017 1451   MCH 28.3 06/24/2017 0545   MCHC 31.6 06/24/2017 0545   RDW 15.9 (H) 06/24/2017 0545   RDW 17.5 (H) 06/11/2017 1451   LYMPHSABS 1.1 06/11/2017 1451   MONOABS 0.3 06/11/2017 1451   EOSABS 0.1 06/11/2017 1451   BASOSABS 0.0 06/11/2017 1451   HEPATIC Function Panel  Recent Labs  04/09/17 1357 06/11/17 1451  PROT 7.3  7.0 7.0  6.7   HEMOGLOBIN A1C No components found for: HGA1C,  MPG CARDIAC ENZYMES Lab Results  Component Value Date   CKTOTAL 129 01/01/2012   CKMB 4.6 (H) 01/01/2012   TROPONINI 0.03 (HH) 06/23/2017   TROPONINI 0.03 (HH) 06/23/2017   TROPONINI 0.03 (HH) 06/23/2017   BNP No results for input(s): PROBNP in the last 8760  hours. TSH  Recent Labs  12/06/16 2005 12/18/16 1056 12/22/16 2008  TSH 0.541 0.576 0.481   CHOLESTEROL  Recent Labs  02/24/17 2028 03/05/17 0304 06/24/17 0545  CHOL 196 174 191    Scheduled Meds: . amiodarone  100 mg Oral Daily  . aspirin  324 mg Oral NOW   Or  . aspirin  300 mg Rectal NOW  . aspirin EC  81 mg Oral Daily  . atorvastatin  80 mg Oral q1800  . feeding supplement (ENSURE ENLIVE)  237 mL Oral BID BM  . ferrous sulfate  325 mg Oral Q breakfast  . furosemide  40 mg Oral Daily  . mirtazapine  15 mg Oral QHS  . pantoprazole  40 mg Oral Daily  . potassium chloride  10 mEq Oral BID  . pyridostigmine  30 mg Oral BID  . ticagrelor  90 mg Oral BID  . warfarin  5 mg Oral q1800  . Warfarin - Physician Dosing Inpatient   Does not apply q1800   Continuous Infusions: PRN Meds:.acetaminophen, nitroGLYCERIN, ondansetron (ZOFRAN) IV, traMADol  Assessment/Plan: Acute on chronic left heart systolic failure Chest pain Abnormal troponin-I from demand ischemia S/P DVT S/P stent in LAD and LCx Anemia of blood loss and chronic disease Small apical thrombus in LV Multiple myeloma  Change to oral lasix. Increase activity. Home in AM if stable. CXR in AM. Change to observation bed   LOS: 1 day    Abigail Dials  MD  06/24/2017, 9:58 AM

## 2017-06-24 NOTE — Care Management Obs Status (Signed)
[facility] NOTIFICATION   Patient Details  Name: Abigail [name] MRN: [id number] Date of Birth: 1946-04-09   Medicare Observation Status Notification Given:  Yes    Royston Bake, RN 06/24/2017, 2:27 PM

## 2017-06-24 NOTE — Care Management CC44 (Deleted)
Condition Code 44 Documentation Completed  Patient Details  Name: Abigail [name] MRN: [id number] Date of Birth: Aug 09, 1946   Condition Code 44 given:    Patient signature on Condition Code 44 notice:    Documentation of 2 MD's agreement:    Code 44 added to claim:       Royston Bake, RN 06/24/2017, 2:27 PM

## 2017-06-24 NOTE — Care Management CC44 (Signed)
Condition Code 44 Documentation Completed  Patient Details  Name: Abigail [name] MRN: [id number] Date of Birth: 03-21-1946   Condition Code 44 given:  Yes Patient signature on Condition Code 44 notice:  Yes Documentation of 2 MD's agreement:  Yes Code 44 added to claim:  Yes    Royston Bake, RN 06/24/2017, 2:28 PM

## 2017-06-25 ENCOUNTER — Other Ambulatory Visit: Payer: Self-pay

## 2017-06-25 ENCOUNTER — Observation Stay (HOSPITAL_COMMUNITY): Payer: Medicare HMO

## 2017-06-25 DIAGNOSIS — I5023 Acute on chronic systolic (congestive) heart failure: Secondary | ICD-10-CM | POA: Diagnosis not present

## 2017-06-25 DIAGNOSIS — I248 Other forms of acute ischemic heart disease: Secondary | ICD-10-CM | POA: Diagnosis not present

## 2017-06-25 DIAGNOSIS — I130 Hypertensive heart and chronic kidney disease with heart failure and stage 1 through stage 4 chronic kidney disease, or unspecified chronic kidney disease: Secondary | ICD-10-CM | POA: Diagnosis not present

## 2017-06-25 DIAGNOSIS — I25110 Atherosclerotic heart disease of native coronary artery with unstable angina pectoris: Secondary | ICD-10-CM | POA: Diagnosis not present

## 2017-06-25 LAB — BASIC METABOLIC PANEL
Anion gap: 13 (ref 5–15)
BUN: 41 mg/dL — ABNORMAL HIGH (ref 6–20)
CALCIUM: 9.1 mg/dL (ref 8.9–10.3)
CO2: 23 mmol/L (ref 22–32)
CREATININE: 1.52 mg/dL — AB (ref 0.44–1.00)
Chloride: 108 mmol/L (ref 101–111)
GFR calc non Af Amer: 34 mL/min — ABNORMAL LOW (ref >60)
GFR, EST AFRICAN AMERICAN: 39 mL/min — AB (ref >60)
GLUCOSE: 98 mg/dL (ref 65–99)
Potassium: 3.9 mmol/L (ref 3.5–5.1)
Sodium: 144 mmol/L (ref 135–145)

## 2017-06-25 LAB — BRAIN NATRIURETIC PEPTIDE: B Natriuretic Peptide: 184.8 pg/mL — ABNORMAL HIGH (ref 0.0–100.0)

## 2017-06-25 LAB — PROTIME-INR
INR: 2.23
PROTHROMBIN TIME: 24.5 s — AB (ref 11.4–15.2)

## 2017-06-25 MED ORDER — WARFARIN SODIUM 5 MG PO TABS: 5.0000 mg | ORAL_TABLET | Freq: Every day | ORAL | Status: DC

## 2017-06-25 NOTE — Discharge Summary (Signed)
Physician Discharge Summary  Patient ID: Abigail [name] MRN: [id number] DOB/AGE: [birth year] 71 y.o.  Admit date: 06/23/2017 Discharge date: 06/25/2017  Admission Diagnoses: Acute on chronic left heart systolic failure Chest pain rule out MI Status post DVT Status post stent in LAD and LCx Multiple myeloma Anemia of blood loss Small apical thrombus and left ventricle  Discharge Diagnoses:  Principal problem: *Acute on chronic left heart systolic failure* Active Problems:   Unstable angina   Abnormal troponin I from demand ischemia   Status post DVT   Status post stent in LAD and LCx   Anemia of blood loss and chronic disease   Small apical thrombus in left ventricle   Multiple myeloma     CKD, 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 Her chest x-ray showed pulmonary edema. She was treated with IV Lasix followed by oral Lasix with a significant improvement in her ability to breathe and ambulate. Her troponin elevation was minimal and considered to be a demand ischemia from heart failure. Beta-blocker was held due to sinus bradycardia and Losartan was held for renal insufficiency. She was discharged home in stable condition with follow-up by me with PT/INR recheck in 1 week and by primary care physician in one month.  Consults: cardiology  Significant Diagnostic Studies: labs: Normal WBC and Platelets count. Hgb was low at 10.1 g. BMET was near normal with mild increase in BUN/Cr with lasix use. Lipid panel was near normal. TSH was normal with slightly elevated free T4. BNP improved from 938 to 184.3 in 48 hours. Troponin-I was minimally elevated.   Treatments: cardiac meds: furosemide, amiodarone and potassium.   Discharge Exam: Blood pressure (!) 109/58, pulse (!) 51, temperature 98.2 F (36.8 C), temperature source Oral,  resp. rate 20, height _0  (1.651 m), weight 64.6 kg (142 lb 6.4 oz), SpO2 97 %. General appearance: alert, cooperative and appears stated age. Head: Normocephalic, atraumatic. Eyes: Brown eyes, pink conjunctiva, corneas clear. PERRL, EOM's intact.  Neck: No adenopathy, no carotid bruit, no JVD, supple, symmetrical, trachea midline and thyroid not enlarged. Resp: Clear to auscultation bilaterally. Cardio: Regular rate and rhythm, S1, S2 normal, II/VI systolic murmur, no click, rub or gallop. GI: Soft, non-tender; bowel sounds normal; no organomegaly. Extremities: No edema, cyanosis or clubbing. Skin: Warm and dry.  Neurologic: Alert and oriented X 3, normal strength and tone. Normal coordination and gait.  Disposition: 01-Home or Self Care   Allergies as of 06/25/2017      Reactions   Sulfa Antibiotics Rash      Medication List    STOP taking these medications   mirtazapine 15 MG tablet Commonly known as:  REMERON     TAKE these medications   acyclovir 400 MG tablet Commonly known as:  ZOVIRAX TAKE 1 TABLET (400 MG TOTAL) BY MOUTH 2 (TWO) TIMES DAILY.   amiodarone 200 MG tablet Commonly known as:  PACERONE Take 0.5 tablets (100 mg total) by mouth daily.   amoxicillin 500 MG capsule Commonly known as:  AMOXIL Take 500 mg by mouth 3 (three) times daily.   atorvastatin 80 MG tablet Commonly known as:  LIPITOR Take 1 tablet (80 mg total) by mouth daily at 6 PM.   calcium carbonate 1250 (500 Ca) MG tablet Commonly known as:  OS-CAL - dosed in mg of elemental calcium  Take 1 tablet by mouth daily with breakfast.   ferrous sulfate 325 (65 FE) MG tablet Take 1 tablet (325 mg total) by mouth daily with breakfast.   furosemide 40 MG tablet Commonly known as:  LASIX Take 1 tablet (40 mg total) by mouth every Monday, Wednesday, and Friday. What changed:  when to take this  reasons to take this  additional instructions   nitroGLYCERIN 0.4 MG SL tablet Commonly known  as:  NITROSTAT Place 0.4 mg under the tongue every 5 (five) minutes as needed for chest pain.   pantoprazole 40 MG tablet Commonly known as:  PROTONIX Take 1 tablet (40 mg total) by mouth daily.   polyethylene glycol packet Commonly known as:  MIRALAX / GLYCOLAX Take 17 g by mouth daily as needed for moderate constipation.   potassium chloride 10 MEQ tablet Commonly known as:  K-DUR,KLOR-CON Take 1 tablet (10 mEq total) by mouth 2 (two) times daily.   pyridostigmine 60 MG tablet Commonly known as:  MESTINON Take 0.5 tablets (30 mg total) by mouth 2 (two) times daily.   ticagrelor 90 MG Tabs tablet Commonly known as:  BRILINTA Take 1 tablet (90 mg total) by mouth 2 (two) times daily.   traMADol 50 MG tablet Commonly known as:  ULTRAM Take 50 mg by mouth at bedtime as needed for pain.   vitamin C 1000 MG tablet Take 1,000 mg by mouth daily.   VITAMIN D PO Take 1 capsule by mouth daily.   warfarin 5 MG tablet Commonly known as:  COUMADIN Take 1 tablet (5 mg total) by mouth daily at 6 PM. What changed:  additional instructions            Discharge Care Instructions        Start     Ordered   06/25/17 0000  warfarin (COUMADIN) 5 MG tablet  Daily-1800     06/25/17 0851     Follow-up Information    Nolene Ebbs, MD. Schedule an appointment as soon as possible for a visit in 1 month(s).   Specialty:  Internal Medicine Contact information: [place] [address] [phone]        [name], MD. Schedule an appointment as soon as possible for a visit in 1 week(s).   Specialty:  Cardiology Contact information: [place] [address] [phone]           Signed: Birdie Riddle 06/25/2017, 8:52 AM

## 2017-06-25 NOTE — Progress Notes (Signed)
Pt has orders to be discharged. Discharge instructions given and pt has no additional questions at this time. Medication regimen reviewed and pt educated. Pt verbalized understanding and has no additional questions. Telemetry box removed. IV removed and site in good condition. Pt stable and waiting for transportation.  Charnese Federici RN 

## 2017-07-30 ENCOUNTER — Ambulatory Visit (HOSPITAL_BASED_OUTPATIENT_CLINIC_OR_DEPARTMENT_OTHER): Payer: Medicare HMO | Admitting: Nurse Practitioner

## 2017-07-30 ENCOUNTER — Other Ambulatory Visit (HOSPITAL_BASED_OUTPATIENT_CLINIC_OR_DEPARTMENT_OTHER): Payer: Medicare HMO

## 2017-07-30 ENCOUNTER — Ambulatory Visit: Payer: Medicare HMO

## 2017-07-30 VITALS — BP 171/68 | HR 50 | Temp 98.9°F | Resp 18 | Wt 157.8 lb

## 2017-07-30 DIAGNOSIS — C9000 Multiple myeloma not having achieved remission: Secondary | ICD-10-CM

## 2017-07-30 DIAGNOSIS — M79606 Pain in leg, unspecified: Secondary | ICD-10-CM | POA: Diagnosis not present

## 2017-07-30 LAB — CBC WITH DIFFERENTIAL/PLATELET
BASO%: 0.9 % (ref 0.0–2.0)
BASOS ABS: 0 10*3/uL (ref 0.0–0.1)
EOS ABS: 0.1 10*3/uL (ref 0.0–0.5)
EOS%: 2.8 % (ref 0.0–7.0)
HCT: 33.8 % — ABNORMAL LOW (ref 34.8–46.6)
HEMOGLOBIN: 10.6 g/dL — AB (ref 11.6–15.9)
LYMPH%: 40.6 % (ref 14.0–49.7)
MCH: 28.7 pg (ref 25.1–34.0)
MCHC: 31.5 g/dL (ref 31.5–36.0)
MCV: 90.9 fL (ref 79.5–101.0)
MONO#: 0.4 10*3/uL (ref 0.1–0.9)
MONO%: 13.6 % (ref 0.0–14.0)
NEUT#: 1.4 10*3/uL — ABNORMAL LOW (ref 1.5–6.5)
NEUT%: 42.1 % (ref 38.4–76.8)
Platelets: 203 10*3/uL (ref 145–400)
RBC: 3.71 10*6/uL (ref 3.70–5.45)
RDW: 16.6 % — AB (ref 11.2–14.5)
WBC: 3.3 10*3/uL — ABNORMAL LOW (ref 3.9–10.3)
lymph#: 1.3 10*3/uL (ref 0.9–3.3)

## 2017-07-30 LAB — COMPREHENSIVE METABOLIC PANEL
ALBUMIN: 4 g/dL (ref 3.5–5.0)
ALK PHOS: 122 U/L (ref 40–150)
ALT: 10 U/L (ref 0–55)
AST: 16 U/L (ref 5–34)
Anion Gap: 9 mEq/L (ref 3–11)
BILIRUBIN TOTAL: 0.43 mg/dL (ref 0.20–1.20)
BUN: 24.4 mg/dL (ref 7.0–26.0)
CALCIUM: 9.1 mg/dL (ref 8.4–10.4)
CO2: 20 mEq/L — ABNORMAL LOW (ref 22–29)
Chloride: 112 mEq/L — ABNORMAL HIGH (ref 98–109)
Creatinine: 1.2 mg/dL — ABNORMAL HIGH (ref 0.6–1.1)
EGFR: 55 mL/min/{1.73_m2} — AB (ref >60)
Glucose: 79 mg/dl (ref 70–140)
POTASSIUM: 4.8 meq/L (ref 3.5–5.1)
Sodium: 141 mEq/L (ref 136–145)
Total Protein: 6.6 g/dL (ref 6.4–8.3)

## 2017-07-30 MED ORDER — TRAMADOL HCL 50 MG PO TABS: 50.0000 mg | ORAL_TABLET | Freq: Every evening | ORAL | 0 refills | Status: DC | PRN

## 2017-07-30 NOTE — Progress Notes (Signed)
  [place] [facility] OFFICE PROGRESS NOTE   Diagnosis:  Multiple myeloma  INTERVAL HISTORY:   Abigail [name] returns as scheduled. She was hospitalized in September with heart failure. Diuretic regimen has been adjusted. She denies shortness of breath. She has bilateral leg pain mainly at night. She attributes this to leg swelling. No bowel or bladder problems.  Objective:  Vital signs in last 24 hours:  Blood pressure (!) 171/68, pulse (!) 50, temperature 98.9 F (37.2 C), temperature source Oral, resp. rate 18, weight 157 lb 12.8 oz (71.6 kg), SpO2 100 %.    HEENT: no thrush or ulcers. Resp: lungs clear bilaterally. Cardio: regular rate and rhythm. GI: abdomen soft and nontender. No hepatosplenomegaly. Vascular: no leg edema. Calves soft and nontender.   Lab Results:  Lab Results  Component Value Date   WBC 3.3 (L) 07/30/2017   HGB 10.6 (L) 07/30/2017   HCT 33.8 (L) 07/30/2017   MCV 90.9 07/30/2017   PLT 203 07/30/2017   NEUTROABS 1.4 (L) 07/30/2017    Imaging:  No results found.  Medications: I have reviewed the patient's current medications.  Assessment/Plan: 1. Multiple myeloma, IgG lambda, bone marrow biopsy 06/15/2016 confirmed multiple myeloma; cytogenetics by FISHshow +11, +12, 13q-  Elevated serum free lambda light chains  Lambda light chain proteinuria  Lytic bone lesions on a bone survey 06/13/2016  Initiation of weekly Velcade/Decadron 06/19/2016  Serum light chains improved 07/31/2016  Initiation of Revlimid 11/03/20172 weeks on/2 weeks off  Serum M spike and IgG significantly improved 09/25/2016  Velcade held on 10/30/2016 due to urinary retention  Treatment resumed with Revlimid (2 weeks on/2 weeks off) and weekly Decadron on 02/12/2017, treatment placed on hold May 2018 secondary to admissions with cardiac disease   2. Severe anemia secondary to #1-markedly improved  3. Diffuse lytic bone lesions secondary to multiple myeloma,  status post Zometa 09/18/2016(plan to continue every 3 months)  4. History of coronary artery disease/myocardial infarction  5. History of colon polyps  6. Bilateral leg edema 09/04/2016. Negative bilateral venous Doppler 09/05/2016.  7. Mild periorbital edema 09/18/2016, question related to early stye formation, question Velcade related chalazia.Improved.  8. CT scan 10/27/2016 with a new right kidney massevaluated by urology; MRI abdomen 11/18/2016 multiple bilateral renal cysts. Most of these are simple cysts. The 2 lesions in question on the CT scan are both hemorrhagic or proteinaceous cysts. No worrisome contrast enhancement.  9. Urinary retention 10/28/2016 status post evaluation in the emergency department. Urinalysis negative for signs of infection 10/30/2016. Velcade held. Resolved.  10. Abdominal pain, nausea, vomiting.Etiology unclear. Upper endoscopy 11/18/2016 with findings of reflux esophagitis, benign appearing esophageal stenosis, small hiatal hernia and erythematous duodenopathy. CT abdomen/pelvis 11/19/2016 with no acute abnormality.  11. 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 Left lower extremity DVT May 2018. Now maintained on Coumadin.   Disposition: Abigail [name] appears stable. There is no clinical evidence for progression of the myeloma. We will follow-up on the myeloma panel from today. She will return for a follow-up visit and Zometa in 6 weeks.  She reports receiving the influenza vaccine at another office.  The etiology of the leg pain is unclear. She was given a prescription for tramadol 50 mg at bedtime as needed. She will follow-up with her PCP with persistent pain.  Plan reviewed with Dr. Benay Spice.  Abigail [name] ANP/GNP-BC   07/30/2017  4:16 PM

## 2017-07-31 LAB — IGG: IGG (IMMUNOGLOBIN G), SERUM: 547 mg/dL — AB (ref 700–1600)

## 2017-08-01 LAB — KAPPA/LAMBDA LIGHT CHAINS
IG LAMBDA FREE LIGHT CHAIN: 10.1 mg/L (ref 5.7–26.3)
Ig Kappa Free Light Chain: 8.4 mg/L (ref 3.3–19.4)
Kappa/Lambda FluidC Ratio: 0.83 (ref 0.26–1.65)

## 2017-08-03 ENCOUNTER — Telehealth: Payer: Self-pay | Admitting: Oncology

## 2017-08-03 LAB — PROTEIN ELECTROPHORESIS, SERUM
A/G Ratio: 1.5 (ref 0.7–1.7)
ALPHA 2: 0.7 g/dL (ref 0.4–1.0)
Albumin: 3.7 g/dL (ref 2.9–4.4)
Alpha 1: 0.2 g/dL (ref 0.0–0.4)
Beta: 0.9 g/dL (ref 0.7–1.3)
GLOBULIN, TOTAL: 2.4 g/dL (ref 2.2–3.9)
Gamma Globulin: 0.6 g/dL (ref 0.4–1.8)
M-SPIKE, %: 0.2 g/dL — AB
Total Protein: 6.1 g/dL (ref 6.0–8.5)

## 2017-08-03 NOTE — Telephone Encounter (Signed)
Scheduled appt per 10/11 - sent reminder letter in the mail.

## 2017-08-05 ENCOUNTER — Telehealth: Payer: Self-pay | Admitting: Oncology

## 2017-08-05 NOTE — Telephone Encounter (Signed)
Spoke with patient regarding appts scheduled per 10/11 los.

## 2017-08-16 ENCOUNTER — Inpatient Hospital Stay (HOSPITAL_COMMUNITY)
Admission: EM | Admit: 2017-08-16 | Discharge: 2017-08-21 | DRG: 871 | Disposition: A | Discharge status: Home Health | Payer: Medicare HMO | Attending: Internal Medicine | Admitting: Internal Medicine

## 2017-08-16 ENCOUNTER — Other Ambulatory Visit: Payer: Self-pay

## 2017-08-16 ENCOUNTER — Emergency Department (HOSPITAL_COMMUNITY): Payer: Medicare HMO

## 2017-08-16 ENCOUNTER — Encounter (HOSPITAL_COMMUNITY): Payer: Self-pay | Admitting: Nurse Practitioner

## 2017-08-16 DIAGNOSIS — Z955 Presence of coronary angioplasty implant and graft: Secondary | ICD-10-CM

## 2017-08-16 DIAGNOSIS — I10 Essential (primary) hypertension: Secondary | ICD-10-CM | POA: Diagnosis present

## 2017-08-16 DIAGNOSIS — I252 Old myocardial infarction: Secondary | ICD-10-CM

## 2017-08-16 DIAGNOSIS — R001 Bradycardia, unspecified: Secondary | ICD-10-CM | POA: Diagnosis present

## 2017-08-16 DIAGNOSIS — C9001 Multiple myeloma in remission: Secondary | ICD-10-CM | POA: Diagnosis present

## 2017-08-16 DIAGNOSIS — G43909 Migraine, unspecified, not intractable, without status migrainosus: Secondary | ICD-10-CM | POA: Diagnosis present

## 2017-08-16 DIAGNOSIS — I2510 Atherosclerotic heart disease of native coronary artery without angina pectoris: Secondary | ICD-10-CM | POA: Diagnosis present

## 2017-08-16 DIAGNOSIS — E876 Hypokalemia: Secondary | ICD-10-CM | POA: Diagnosis present

## 2017-08-16 DIAGNOSIS — K922 Gastrointestinal hemorrhage, unspecified: Secondary | ICD-10-CM | POA: Diagnosis present

## 2017-08-16 DIAGNOSIS — R509 Fever, unspecified: Secondary | ICD-10-CM

## 2017-08-16 DIAGNOSIS — E785 Hyperlipidemia, unspecified: Secondary | ICD-10-CM | POA: Diagnosis present

## 2017-08-16 DIAGNOSIS — Z79899 Other long term (current) drug therapy: Secondary | ICD-10-CM

## 2017-08-16 DIAGNOSIS — K219 Gastro-esophageal reflux disease without esophagitis: Secondary | ICD-10-CM | POA: Diagnosis present

## 2017-08-16 DIAGNOSIS — K921 Melena: Secondary | ICD-10-CM

## 2017-08-16 DIAGNOSIS — A419 Sepsis, unspecified organism: Secondary | ICD-10-CM | POA: Diagnosis present

## 2017-08-16 DIAGNOSIS — T45515A Adverse effect of anticoagulants, initial encounter: Secondary | ICD-10-CM | POA: Diagnosis present

## 2017-08-16 DIAGNOSIS — I130 Hypertensive heart and chronic kidney disease with heart failure and stage 1 through stage 4 chronic kidney disease, or unspecified chronic kidney disease: Secondary | ICD-10-CM | POA: Diagnosis present

## 2017-08-16 DIAGNOSIS — M79662 Pain in left lower leg: Secondary | ICD-10-CM | POA: Diagnosis present

## 2017-08-16 DIAGNOSIS — I272 Pulmonary hypertension, unspecified: Secondary | ICD-10-CM | POA: Diagnosis present

## 2017-08-16 DIAGNOSIS — K5731 Diverticulosis of large intestine without perforation or abscess with bleeding: Secondary | ICD-10-CM | POA: Diagnosis present

## 2017-08-16 DIAGNOSIS — Y95 Nosocomial condition: Secondary | ICD-10-CM | POA: Diagnosis present

## 2017-08-16 DIAGNOSIS — I5022 Chronic systolic (congestive) heart failure: Secondary | ICD-10-CM | POA: Diagnosis present

## 2017-08-16 DIAGNOSIS — D899 Disorder involving the immune mechanism, unspecified: Secondary | ICD-10-CM | POA: Diagnosis present

## 2017-08-16 DIAGNOSIS — J189 Pneumonia, unspecified organism: Secondary | ICD-10-CM | POA: Diagnosis present

## 2017-08-16 DIAGNOSIS — C9000 Multiple myeloma not having achieved remission: Secondary | ICD-10-CM | POA: Diagnosis not present

## 2017-08-16 DIAGNOSIS — K210 Gastro-esophageal reflux disease with esophagitis: Secondary | ICD-10-CM | POA: Diagnosis present

## 2017-08-16 DIAGNOSIS — Z86718 Personal history of other venous thrombosis and embolism: Secondary | ICD-10-CM | POA: Diagnosis not present

## 2017-08-16 DIAGNOSIS — I255 Ischemic cardiomyopathy: Secondary | ICD-10-CM | POA: Diagnosis present

## 2017-08-16 DIAGNOSIS — M79661 Pain in right lower leg: Secondary | ICD-10-CM | POA: Diagnosis present

## 2017-08-16 DIAGNOSIS — D6832 Hemorrhagic disorder due to extrinsic circulating anticoagulants: Secondary | ICD-10-CM | POA: Diagnosis present

## 2017-08-16 DIAGNOSIS — Z87891 Personal history of nicotine dependence: Secondary | ICD-10-CM

## 2017-08-16 DIAGNOSIS — D62 Acute posthemorrhagic anemia: Secondary | ICD-10-CM | POA: Diagnosis present

## 2017-08-16 DIAGNOSIS — K625 Hemorrhage of anus and rectum: Secondary | ICD-10-CM | POA: Diagnosis not present

## 2017-08-16 DIAGNOSIS — N183 Chronic kidney disease, stage 3 (moderate): Secondary | ICD-10-CM | POA: Diagnosis present

## 2017-08-16 DIAGNOSIS — Z7901 Long term (current) use of anticoagulants: Secondary | ICD-10-CM

## 2017-08-16 DIAGNOSIS — Z803 Family history of malignant neoplasm of breast: Secondary | ICD-10-CM

## 2017-08-16 DIAGNOSIS — Z86010 Personal history of colonic polyps: Secondary | ICD-10-CM

## 2017-08-16 DIAGNOSIS — Z8711 Personal history of peptic ulcer disease: Secondary | ICD-10-CM

## 2017-08-16 HISTORY — DX: Acute embolism and thrombosis of unspecified deep veins of unspecified lower extremity: I82.409

## 2017-08-16 LAB — COMPREHENSIVE METABOLIC PANEL
ALT: 10 U/L — ABNORMAL LOW (ref 14–54)
ANION GAP: 9 (ref 5–15)
AST: 22 U/L (ref 15–41)
Albumin: 3.4 g/dL — ABNORMAL LOW (ref 3.5–5.0)
Alkaline Phosphatase: 71 U/L (ref 38–126)
BUN: 22 mg/dL — ABNORMAL HIGH (ref 6–20)
CHLORIDE: 110 mmol/L (ref 101–111)
CO2: 23 mmol/L (ref 22–32)
Calcium: 8.8 mg/dL — ABNORMAL LOW (ref 8.9–10.3)
Creatinine, Ser: 1.17 mg/dL — ABNORMAL HIGH (ref 0.44–1.00)
GFR, EST AFRICAN AMERICAN: 53 mL/min — AB (ref >60)
GFR, EST NON AFRICAN AMERICAN: 46 mL/min — AB (ref >60)
Glucose, Bld: 110 mg/dL — ABNORMAL HIGH (ref 65–99)
POTASSIUM: 5 mmol/L (ref 3.5–5.1)
Sodium: 142 mmol/L (ref 135–145)
Total Bilirubin: 0.9 mg/dL (ref 0.3–1.2)
Total Protein: 7.1 g/dL (ref 6.5–8.1)

## 2017-08-16 LAB — CBC WITH DIFFERENTIAL/PLATELET
BASOS PCT: 0 %
Basophils Absolute: 0 10*3/uL (ref 0.0–0.1)
EOS ABS: 0 10*3/uL (ref 0.0–0.7)
Eosinophils Relative: 0 %
HCT: 26 % — ABNORMAL LOW (ref 36.0–46.0)
Hemoglobin: 8.3 g/dL — ABNORMAL LOW (ref 12.0–15.0)
LYMPHS ABS: 1.2 10*3/uL (ref 0.7–4.0)
LYMPHS PCT: 11 %
MCH: 28.8 pg (ref 26.0–34.0)
MCHC: 31.9 g/dL (ref 30.0–36.0)
MCV: 90.3 fL (ref 78.0–100.0)
Monocytes Absolute: 1.7 10*3/uL — ABNORMAL HIGH (ref 0.1–1.0)
Monocytes Relative: 16 %
NEUTROS ABS: 8.1 10*3/uL — AB (ref 1.7–7.7)
NEUTROS PCT: 73 %
PLATELETS: 274 10*3/uL (ref 150–400)
RBC: 2.88 MIL/uL — AB (ref 3.87–5.11)
RDW: 16.1 % — ABNORMAL HIGH (ref 11.5–15.5)
WBC: 11.1 10*3/uL — AB (ref 4.0–10.5)

## 2017-08-16 LAB — URINALYSIS, ROUTINE W REFLEX MICROSCOPIC
Bilirubin Urine: NEGATIVE
GLUCOSE, UA: NEGATIVE mg/dL
Ketones, ur: NEGATIVE mg/dL
Leukocytes, UA: NEGATIVE
Nitrite: NEGATIVE
Protein, ur: NEGATIVE mg/dL
SPECIFIC GRAVITY, URINE: 1.018 (ref 1.005–1.030)
pH: 5 (ref 5.0–8.0)

## 2017-08-16 LAB — OCCULT BLOOD, POC DEVICE: Fecal Occult Bld: POSITIVE — AB

## 2017-08-16 LAB — CG4 I-STAT (LACTIC ACID): LACTIC ACID, VENOUS: 0.96 mmol/L (ref 0.5–1.9)

## 2017-08-16 MED ORDER — ONDANSETRON HCL 4 MG PO TABS: 4.0000 mg | ORAL_TABLET | Freq: Four times a day (QID) | ORAL | Status: DC | PRN

## 2017-08-16 MED ORDER — PANTOPRAZOLE SODIUM 40 MG IV SOLR
40.0000 mg | Freq: Two times a day (BID) | INTRAVENOUS | Status: DC
  Administered 2017-08-17 (×2): 40 mg via INTRAVENOUS
  Filled 2017-08-16 (×2): qty 40

## 2017-08-16 MED ORDER — ONDANSETRON HCL 4 MG/2ML IJ SOLN: 4.0000 mg | Freq: Four times a day (QID) | INTRAMUSCULAR | Status: DC | PRN

## 2017-08-16 MED ORDER — PYRIDOSTIGMINE BROMIDE 60 MG PO TABS
30.0000 mg | ORAL_TABLET | Freq: Two times a day (BID) | ORAL | Status: DC
  Administered 2017-08-17 – 2017-08-21 (×9): 30 mg via ORAL
  Filled 2017-08-16 (×11): qty 0.5

## 2017-08-16 MED ORDER — SODIUM CHLORIDE 0.9 % IV SOLN: INTRAVENOUS | Status: DC

## 2017-08-16 MED ORDER — PROCHLORPERAZINE EDISYLATE 5 MG/ML IJ SOLN
10.0000 mg | Freq: Once | INTRAMUSCULAR | Status: AC
  Administered 2017-08-16: 10 mg via INTRAVENOUS
  Filled 2017-08-16: qty 2

## 2017-08-16 MED ORDER — ATORVASTATIN CALCIUM 40 MG PO TABS
80.0000 mg | ORAL_TABLET | Freq: Every day | ORAL | Status: DC
  Administered 2017-08-18 – 2017-08-20 (×2): 80 mg via ORAL
  Filled 2017-08-16 (×2): qty 2

## 2017-08-16 MED ORDER — TICAGRELOR 90 MG PO TABS
90.0000 mg | ORAL_TABLET | Freq: Two times a day (BID) | ORAL | Status: DC
  Filled 2017-08-16: qty 1

## 2017-08-16 MED ORDER — ACYCLOVIR 400 MG PO TABS
400.0000 mg | ORAL_TABLET | Freq: Two times a day (BID) | ORAL | Status: DC
  Administered 2017-08-17 – 2017-08-21 (×9): 400 mg via ORAL
  Filled 2017-08-16 (×11): qty 1

## 2017-08-16 MED ORDER — SODIUM CHLORIDE 0.9 % IV BOLUS (SEPSIS)
1000.0000 mL | Freq: Once | INTRAVENOUS | Status: AC
  Administered 2017-08-16: 1000 mL via INTRAVENOUS

## 2017-08-16 MED ORDER — MORPHINE SULFATE (PF) 2 MG/ML IV SOLN
2.0000 mg | INTRAVENOUS | Status: DC | PRN
  Administered 2017-08-17: 2 mg via INTRAVENOUS
  Filled 2017-08-16: qty 1

## 2017-08-16 MED ORDER — VANCOMYCIN HCL IN DEXTROSE 750-5 MG/150ML-% IV SOLN
750.0000 mg | Freq: Two times a day (BID) | INTRAVENOUS | Status: DC
  Filled 2017-08-16: qty 150

## 2017-08-16 MED ORDER — PIPERACILLIN-TAZOBACTAM 3.375 G IVPB 30 MIN: 3.3750 g | Freq: Once | INTRAVENOUS | Status: DC

## 2017-08-16 MED ORDER — ACETAMINOPHEN 325 MG PO TABS
650.0000 mg | ORAL_TABLET | Freq: Once | ORAL | Status: AC
  Administered 2017-08-16: 650 mg via ORAL
  Filled 2017-08-16: qty 2

## 2017-08-16 MED ORDER — PIPERACILLIN-TAZOBACTAM 3.375 G IVPB
3.3750 g | Freq: Three times a day (TID) | INTRAVENOUS | Status: DC
  Administered 2017-08-17: 3.375 g via INTRAVENOUS
  Filled 2017-08-16: qty 50

## 2017-08-16 MED ORDER — PANTOPRAZOLE SODIUM 40 MG IV SOLR
40.0000 mg | Freq: Once | INTRAVENOUS | Status: AC
  Administered 2017-08-16: 40 mg via INTRAVENOUS
  Filled 2017-08-16: qty 40

## 2017-08-16 MED ORDER — VANCOMYCIN HCL IN DEXTROSE 1-5 GM/200ML-% IV SOLN
1000.0000 mg | Freq: Once | INTRAVENOUS | Status: DC
  Filled 2017-08-16: qty 200

## 2017-08-16 MED ORDER — AMIODARONE HCL 100 MG PO TABS
100.0000 mg | ORAL_TABLET | Freq: Every day | ORAL | Status: DC
  Administered 2017-08-17 – 2017-08-21 (×3): 100 mg via ORAL
  Filled 2017-08-16 (×4): qty 1

## 2017-08-16 MED ORDER — ACETAMINOPHEN 325 MG PO TABS
650.0000 mg | ORAL_TABLET | Freq: Four times a day (QID) | ORAL | Status: DC | PRN
  Administered 2017-08-17 (×4): 650 mg via ORAL
  Filled 2017-08-16 (×5): qty 2

## 2017-08-16 MED ORDER — DEXTROSE 5 % IV SOLN
1.0000 g | Freq: Once | INTRAVENOUS | Status: AC
  Administered 2017-08-16: 1 g via INTRAVENOUS
  Filled 2017-08-16: qty 1

## 2017-08-16 MED ORDER — ACETAMINOPHEN 650 MG RE SUPP: 650.0000 mg | Freq: Four times a day (QID) | RECTAL | Status: DC | PRN

## 2017-08-16 MED ORDER — IOPAMIDOL (ISOVUE-300) INJECTION 61%
INTRAVENOUS | Status: AC
  Administered 2017-08-16: 100 mL via INTRAVENOUS
  Filled 2017-08-16: qty 100

## 2017-08-16 NOTE — ED Triage Notes (Signed)
Pt is c/o severe headache, fevr adding that she has been having bloody stools since Friday, on average 2-3 BMs all with blood. Reports she is in remission for CA treatments but has been feeling awfully sick for whole of last week.

## 2017-08-16 NOTE — ED Notes (Signed)
ED TO INPATIENT HANDOFF REPORT  Name/Age/Gender Abigail [name] 71 y.o. female  Code Status Code Status History    Date Active Date Inactive Code Status Order ID Comments User Context   06/23/2017 12:13 PM 06/25/2017  3:16 PM Full Code [id number]  [name], MD ED   04/02/2017  9:56 AM 04/08/2017  7:01 PM Full Code [id number]  [name], MD ED   03/04/2017  8:08 AM 03/12/2017  5:28 PM Full Code [id number]  [name], MD ED   02/24/2017 11:34 PM 03/01/2017  7:31 PM Full Code [id number]  Charolette Forward, MD Inpatient   02/24/2017 11:33 PM 02/24/2017 11:34 PM Full Code [id number]  Doylene Canning, MD Inpatient   01/21/2017  5:38 PM 01/25/2017  6:34 PM Full Code [id number]  [name], MD Inpatient   12/29/2016  2:15 PM 01/02/2017 10:39 PM Full Code [id number]  Charolette Forward, MD Inpatient   12/22/2016  7:52 PM 12/29/2016  2:15 PM Full Code [id number]  [name], MD Inpatient   12/06/2016  7:56 PM 12/09/2016  8:08 PM Full Code [id number]  Rosita Fire, MD Inpatient   11/23/2016  4:44 PM 11/27/2016  6:19 PM Full Code [id number]  Elgergawy, Silver Huguenin, MD Inpatient   11/17/2016  2:04 PM 11/21/2016  8:08 PM Full Code [id number]  Patrecia Pour, MD Inpatient   06/12/2016  3:56 PM 06/19/2016 10:03 PM Full Code [id number]  [name], MD Inpatient   01/11/2016  4:08 PM 01/14/2016  2:20 PM Full Code [id number]  [name], MD Inpatient   11/01/2015  5:35 PM 11/04/2015  5:38 PM Full Code [id number]  [name], MD Inpatient   12/31/2011  4:05 AM 01/02/2012  4:40 PM Full Code [id number]  Freddy Jaksch, RN Inpatient      Home/SNF/Other Home  Chief Complaint blood in stool / headache ( cancer pt )   Level of Care/Admitting Diagnosis ED Disposition    ED Disposition Condition [place]: [place] [[id number]]  Level of Care: Med-Surg [16]  Diagnosis: Fever [196222]  Admitting Physician: Karmen Bongo [[id number]]  Attending Physician: Karmen Bongo [[id number]]  Estimated  length of stay: 3 - 4 days  Certification:: I certify this patient will need inpatient services for at least 2 midnights  PT Class (Do Not Modify): Inpatient [101]  PT Acc Code (Do Not Modify): Private [1]       Medical History Past Medical History:  Diagnosis Date  . ACS (acute coronary syndrome) ([facility]) 06/2017  . Anemia    "off and on all my life" (12/22/2016)  . Anxiety   . Bilateral renal artery stenosis ([facility]) 1999   s/p stenting  . Chronic kidney disease (CKD), stage III (moderate) ([facility])    Archie Endo 12/22/2016  . Coronary artery disease 1999  . Depression   . Diverticulosis   . Duodenitis   . DVT (deep venous thrombosis) ([facility])    on Coumadin  . Dyspnea   . Gastric erosions   . Gastric ulcer   . GERD (gastroesophageal reflux disease)   . Heart murmur   . History of blood transfusion ~ 03/2016   "low HgB; practically nonexistent"  . Hyperlipidemia   . Hypertension   . MI (myocardial infarction) ([facility]) 1999  . Multiple myeloma ([facility]) dx'd ~ 04/2016  . PAT (paroxysmal atrial tachycardia) ([facility]) 12/22/2016  . Retinal hemorrhage, right eye   . Schatzki's ring   . Tachy-brady syndrome ([facility])    Archie Endo 12/22/2016  .  Tubular adenoma of colon     Allergies Allergies  Allergen Reactions  . Sulfa Antibiotics Rash    IV Location/Drains/Wounds Patient Lines/Drains/Airways Status   Active Line/Drains/Airways    Name:   Placement date:   Placement time:   Site:   Days:   Peripheral IV 08/16/17 Left Arm  08/16/17    1912    Arm    less than 1          Labs/Imaging Results for orders placed or performed during the hospital encounter of 08/16/17 (from the past 48 hour(s))  Comprehensive metabolic panel     Status: Abnormal   Collection Time: 08/16/17  4:56 PM  Result Value Ref Range   Sodium 142 135 - 145 mmol/L   Potassium 5.0 3.5 - 5.1 mmol/L   Chloride 110 101 - 111 mmol/L   CO2 23 22 - 32 mmol/L   Glucose, Bld 110 (H) 65 - 99 mg/dL   BUN 22 (H) 6 - 20 mg/dL   Creatinine,  Ser 1.17 (H) 0.44 - 1.00 mg/dL   Calcium 8.8 (L) 8.9 - 10.3 mg/dL   Total Protein 7.1 6.5 - 8.1 g/dL   Albumin 3.4 (L) 3.5 - 5.0 g/dL   AST 22 15 - 41 U/L   ALT 10 (L) 14 - 54 U/L   Alkaline Phosphatase 71 38 - 126 U/L   Total Bilirubin 0.9 0.3 - 1.2 mg/dL   GFR calc non Af Amer 46 (L) >60 mL/min   GFR calc Af Amer 53 (L) >60 mL/min    Comment: (NOTE) The eGFR has been calculated using the CKD EPI equation. This calculation has not been validated in all clinical situations. eGFR's persistently <60 mL/min signify possible Chronic Kidney Disease.    Anion gap 9 5 - 15  CBC with Differential     Status: Abnormal   Collection Time: 08/16/17  4:56 PM  Result Value Ref Range   WBC 11.1 (H) 4.0 - 10.5 K/uL   RBC 2.88 (L) 3.87 - 5.11 MIL/uL   Hemoglobin 8.3 (L) 12.0 - 15.0 g/dL   HCT 26.0 (L) 36.0 - 46.0 %   MCV 90.3 78.0 - 100.0 fL   MCH 28.8 26.0 - 34.0 pg   MCHC 31.9 30.0 - 36.0 g/dL   RDW 16.1 (H) 11.5 - 15.5 %   Platelets 274 150 - 400 K/uL   Neutrophils Relative % 73 %   Neutro Abs 8.1 (H) 1.7 - 7.7 K/uL   Lymphocytes Relative 11 %   Lymphs Abs 1.2 0.7 - 4.0 K/uL   Monocytes Relative 16 %   Monocytes Absolute 1.7 (H) 0.1 - 1.0 K/uL   Eosinophils Relative 0 %   Eosinophils Absolute 0.0 0.0 - 0.7 K/uL   Basophils Relative 0 %   Basophils Absolute 0.0 0.0 - 0.1 K/uL  Urinalysis, Routine w reflex microscopic     Status: Abnormal   Collection Time: 08/16/17  4:56 PM  Result Value Ref Range   Color, Urine YELLOW YELLOW   APPearance CLEAR CLEAR   Specific Gravity, Urine 1.018 1.005 - 1.030   pH 5.0 5.0 - 8.0   Glucose, UA NEGATIVE NEGATIVE mg/dL   Hgb urine dipstick MODERATE (A) NEGATIVE   Bilirubin Urine NEGATIVE NEGATIVE   Ketones, ur NEGATIVE NEGATIVE mg/dL   Protein, ur NEGATIVE NEGATIVE mg/dL   Nitrite NEGATIVE NEGATIVE   Leukocytes, UA NEGATIVE NEGATIVE   RBC / HPF 0-5 0 - 5 RBC/hpf   WBC, UA 0-5  0 - 5 WBC/hpf   Bacteria, UA RARE (A) NONE SEEN   Squamous  Epithelial / LPF 0-5 (A) NONE SEEN  Type and screen [place]     Status: None   Collection Time: 08/16/17  4:57 PM  Result Value Ref Range   ABO/RH(D) O POS    Antibody Screen POS    Sample Expiration 08/19/2017    Antibody Identification ANTI E   CG4 I-STAT (Lactic acid)     Status: None   Collection Time: 08/16/17  5:12 PM  Result Value Ref Range   Lactic Acid, Venous 0.96 0.5 - 1.9 mmol/L  Occult blood, poc device     Status: Abnormal   Collection Time: 08/16/17  5:40 PM  Result Value Ref Range   Fecal Occult Bld POSITIVE (A) NEGATIVE   Dg Chest 2 View  Result Date: 08/16/2017 CLINICAL DATA:  Sepsis.  Weakness.  Bloody stool. EXAM: CHEST  2 VIEW COMPARISON:  06/25/2017 FINDINGS: Stable mild cardiomegaly.  Aortic atherosclerosis. Mild infiltrate is seen in the right lower lobe, suspicious for pneumonia. Left lung is clear. No evidence of pneumothorax or pleural effusion. IMPRESSION: Mild right lower lobe infiltrate, suspicious for pneumonia. Recommend clinical correlation; consider followup PA and lateral chest X-ray following trial of antibiotic therapy to ensure resolution and exclude underlying malignancy. Mild cardiomegaly. Electronically Signed   By: Earle Gell M.D.   On: 08/16/2017 16:32   Ct Abdomen Pelvis W Contrast  Result Date: 08/16/2017 CLINICAL DATA:  Headache fever and bloody stools EXAM: CT ABDOMEN AND PELVIS WITH CONTRAST TECHNIQUE: Multidetector CT imaging of the abdomen and pelvis was performed using the standard protocol following bolus administration of intravenous contrast. CONTRAST:  100 mL Isovue-300 intravenous COMPARISON:  12/08/2016, 11/19/2016, 11/18/2016, 10/27/2016 FINDINGS: Lower chest: Ground-glass density in the right lower lobe consistent with pneumonia. No pleural effusion. Coronary calcification. Borderline cardiomegaly. Hepatobiliary: No calcified gallstones. No focal hepatic abnormality. No biliary dilatation Pancreas:  Unremarkable. No pancreatic ductal dilatation or surrounding inflammatory changes. Spleen: Normal in size without focal abnormality. Adrenals/Urinary Tract: Adrenal glands are within normal limits. Kidneys show no hydronephrosis. Subcentimeter hypodense lesions, too small to further characterize. Bilateral renal cysts. Intermediate density right lower pole lesion demonstrated to represent a cyst on prior MRI, no significant change in size. The bladder is unremarkable Stomach/Bowel: Stomach is nonenlarged. No dilated small bowel. No colon wall thickening. Sigmoid colon diverticula without acute inflammation Vascular/Lymphatic: Extensive atherosclerotic calcification. No aneurysmal dilatation. Bilateral renal artery stents. No significantly enlarged abdominal or pelvic lymph nodes Reproductive: Status post hysterectomy. No adnexal masses. Other: Negative for free air or free fluid Musculoskeletal: Possible old sacrococcygeal fracture. Stable retrolisthesis L1 on L2 and T12 on L1. Lucent lesion in T12 now contains central calcification. IMPRESSION: 1. Ground-glass opacity in the right lower lobe is suspicious for pneumonia. Imaging follow-up to resolution is recommended 2. Negative for bowel obstruction, bowel wall thickening or colitis. Scattered sigmoid colon diverticula without acute inflammation 3. Stable bilateral renal lesions 4. Stable size of 12 mm lucent lesion within the right aspect of T12 vertebral body, now with central calcifications. This was thought to possibly relate to history of myeloma. Electronically Signed   By: Donavan Foil M.D.   On: 08/16/2017 20:14    Pending Labs Unresulted Labs    Start     Ordered   08/16/17 1902  Protime-INR  STAT,   STAT     08/16/17 1902   Signed and Held  Culture, blood (x  2)  BLOOD CULTURE X 2,   STAT    Comments:  INITIATE ANTIBIOTICS WITHIN 1 HOUR AFTER BLOOD CULTURES DRAWN.  If unable to obtain blood cultures, call MD immediately regarding antibiotic  instructions.    Signed and Held   Signed and Held  Lactic acid, plasma  STAT Now then every 3 hours,   STAT     Signed and Held   Signed and Held  Procalcitonin  STAT,   R     Signed and Held   Signed and Held  Basic metabolic panel  Tomorrow morning,   R     Signed and Held   Signed and Held  CBC  Tomorrow morning,   R     Signed and Held   Signed and Held  CBC  Once,   R     Signed and Held      Vitals/Pain Today's Vitals   08/16/17 1737 08/16/17 1800 08/16/17 1832 08/16/17 2009  BP: 130/62   (!) 159/66  Pulse: (!) 57   69  Resp: 16   18  Temp: (!) 102.6 F (39.2 C)     TempSrc: Oral     SpO2: 100%   99%  PainSc:  8  Asleep Asleep    Isolation Precautions No active isolations  Medications Medications  vancomycin (VANCOCIN) IVPB 750 mg/150 ml premix (not administered)  vancomycin (VANCOCIN) IVPB 1000 mg/200 mL premix (not administered)  morphine 2 MG/ML injection 2 mg (not administered)  acetaminophen (TYLENOL) tablet 650 mg (650 mg Oral Given 08/16/17 1553)  sodium chloride 0.9 % bolus 1,000 mL (1,000 mLs Intravenous New Bag/Given 08/16/17 1815)  sodium chloride 0.9 % bolus 1,000 mL (1,000 mLs Intravenous New Bag/Given 08/16/17 1815)  prochlorperazine (COMPAZINE) injection 10 mg (10 mg Intravenous Given 08/16/17 1815)  pantoprazole (PROTONIX) injection 40 mg (40 mg Intravenous Given 08/16/17 1831)  iopamidol (ISOVUE-300) 61 % injection (100 mLs Intravenous Contrast Given 08/16/17 1934)  ceFEPIme (MAXIPIME) 1 g in dextrose 5 % 50 mL IVPB (0 g Intravenous Stopped 08/16/17 2039)    Mobility Ambulatory with assistance

## 2017-08-16 NOTE — ED Provider Notes (Signed)
[facility] DEPT Provider Note   CSN: [id number] Arrival date & time: 08/16/17  1516     History   Chief Complaint Chief Complaint  Patient presents with  . Hematochezia  . Fever    HPI Abigail [name] is a 71 y.o. female history of anemia, multiple myeloma on Zometa, CAD s/p stent, colon polyps, DVT on Coumadin and heart failure presents to the ED for evaluation of black stools, fever and headache 2 days. Patient is having formed, painless stools that look like charcoal. She has bright red blood on toilet paper when wiping. Denies abdominal pain, dysuria, hematuria, vaginal bleeding, chest pain or shortness of breath. She has history of occasional migraines that she states are worse when her hemoglobin drops, she started having a headache yesterday. Denies h/o IBS, IBD, diverticulitis, heavy NSAID or ETOH use. Per chart she has documented h/o gastric erosion and ulcer.  Her last oncology note on 07/30/17 patient has no clinical evidence of progression of myeloma, she states of follow-up for chemotherapy in 6 weeks. HPI  Past Medical History:  Diagnosis Date  . ACS (acute coronary syndrome) ([place]) 06/2017  . Anemia    "off and on all my life" (12/22/2016)  . Anxiety   . Bilateral renal artery stenosis ([place]) 1999   s/p stenting  . Chronic kidney disease (CKD), stage III (moderate) ([place])    Archie Endo 12/22/2016  . Coronary artery disease 1999  . Depression   . Diverticulosis   . Duodenitis   . Dyspnea   . Gastric erosions   . Gastric ulcer   . GERD (gastroesophageal reflux disease)   . Heart murmur   . History of blood transfusion ~ 03/2016   "low HgB; practically nonexistent"  . Hyperlipidemia   . Hypertension   . MI (myocardial infarction) ([place]) 1999  . Multiple myeloma ([place]) dx'd ~ 04/2016  . PAT (paroxysmal atrial tachycardia) ([place]) 12/22/2016   Archie Endo 12/22/2016  . Retinal hemorrhage, right eye   . Schatzki's ring   . Tachy-brady syndrome ([place])      Archie Endo 12/22/2016  . Tubular adenoma of colon     Patient Active Problem List   Diagnosis Date Noted  . STEMI (ST elevation myocardial infarction) ([place]) 02/24/2017  . Acute anterolateral wall MI ([place]) 02/24/2017  . Dehydration 01/21/2017  . Acute coronary syndrome ([place]) 12/24/2016  . Dizziness 12/22/2016    Class: Acute  . Orthostatic hypotension 12/08/2016  . Syncope 12/08/2016  . Syncope and collapse 12/06/2016  . Tachycardia-bradycardia ([place])   . Essential hypertension   . Nausea and vomiting in adult patient   . Erosive esophagitis   . Protein-calorie malnutrition, severe 11/19/2016  . LLQ pain   . LUQ abdominal pain   . Intractable nausea and vomiting 11/17/2016  . Abdominal pain 11/05/2016  . Multiple myeloma ([place]) 06/18/2016  . Malnutrition of moderate degree 06/14/2016  . 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 Exertional dyspnea 11/01/2015  . Coronary atherosclerosis of native coronary artery 11/01/2015  . Shortness of breath 11/01/2015  . Chest pain 12/31/2011    Past Surgical History:  Procedure Laterality Date  . APPENDECTOMY    . BACK SURGERY    . CATARACT EXTRACTION W/ INTRAOCULAR LENS IMPLANT Right 2014  . CORONARY ANGIOGRAPHY N/A 01/23/2017   Procedure: Coronary Angiography;  Surgeon: [name], MD;  Location: [facility];  Service: Cardiovascular;  Laterality: N/A;  . CORONARY ANGIOPLASTY WITH STENT PLACEMENT  1999  .  CORONARY STENT INTERVENTION N/A 12/29/2016   Procedure: Coronary Stent Intervention;  Surgeon: Charolette Forward, MD;  Location: [facility];  Service: Cardiovascular;  Laterality: N/A;  . CORONARY STENT INTERVENTION N/A 02/24/2017   Procedure: Coronary Stent Intervention;  Surgeon: Charolette Forward, MD;  Location: [facility];  Service: Cardiovascular;  Laterality: N/A;  . ESOPHAGOGASTRODUODENOSCOPY N/A 11/03/2015   Procedure: ESOPHAGOGASTRODUODENOSCOPY (EGD);  Surgeon: Carol Ada, MD;   Location: [facility];  Service: Endoscopy;  Laterality: N/A;  . ESOPHAGOGASTRODUODENOSCOPY (EGD) WITH PROPOFOL N/A 11/18/2016   Procedure: ESOPHAGOGASTRODUODENOSCOPY (EGD) WITH PROPOFOL;  Surgeon: Ladene Artist, MD;  Location: [facility] ENDOSCOPY;  Service: Endoscopy;  Laterality: N/A;  . LEFT HEART CATH AND CORONARY ANGIOGRAPHY N/A 12/24/2016   Procedure: Left Heart Cath and Coronary Angiography;  Surgeon: [name], MD;  Location: [facility];  Service: Cardiovascular;  Laterality: N/A;  . LEFT HEART CATH AND CORONARY ANGIOGRAPHY N/A 02/24/2017   Procedure: Left Heart Cath and Coronary Angiography;  Surgeon: Charolette Forward, MD;  Location: [facility];  Service: Cardiovascular;  Laterality: N/A;  . LEFT HEART CATH AND CORONARY ANGIOGRAPHY N/A 03/05/2017   Procedure: Left Heart Cath and Coronary Angiography;  Surgeon: [name], MD;  Location: [facility];  Service: Cardiovascular;  Laterality: N/A;  . LUMBAR [place]    . RENAL ARTERY STENT  1999   bil RAS so ? bil vs unilateral stents  . RETINAL LASER PROCEDURE Right 2012   "bleeding"  . TONSILLECTOMY    . TOTAL ABDOMINAL HYSTERECTOMY      OB History    No data available       Home Medications    Prior to Admission medications   Medication Sig Start Date End Date Taking? Authorizing Provider  acetaminophen (TYLENOL) 500 MG tablet Take 1,000 mg by mouth every 6 (six) hours as needed for mild pain or headache.   Yes [provider]  acyclovir (ZOVIRAX) 400 MG tablet TAKE 1 TABLET (400 MG TOTAL) BY MOUTH 2 (TWO) TIMES DAILY. 08/29/16  Yes Ladell Pier, MD  amiodarone (PACERONE) 200 MG tablet Take 0.5 tablets (100 mg total) by mouth daily. 03/12/17  Yes [name], MD  Ascorbic Acid (VITAMIN C) 1000 MG tablet Take 1,000 mg by mouth daily.    Yes [provider]  atorvastatin (LIPITOR) 80 MG tablet Take 1 tablet (80 mg total) by mouth daily at 6 PM. 03/01/17  Yes [name], MD  calcium  carbonate (OS-CAL - DOSED IN MG OF ELEMENTAL CALCIUM) 1250 (500 Ca) MG tablet Take 1 tablet by mouth daily with breakfast.   Yes [provider]  Cholecalciferol (VITAMIN D PO) Take 1 capsule by mouth daily.   Yes [provider]  ferrous sulfate 325 (65 FE) MG tablet Take 1 tablet (325 mg total) by mouth daily with breakfast. 03/02/17  Yes [name], MD  furosemide (LASIX) 40 MG tablet Take 1 tablet (40 mg total) by mouth every Monday, Wednesday, and Friday. Patient taking differently: Take 40 mg by mouth as needed. For edema 04/27/17  Yes [name], MD  nitroGLYCERIN (NITROSTAT) 0.4 MG SL tablet Place 0.4 mg under the tongue every 5 (five) minutes as needed for chest pain. 03/20/17  Yes [provider]  pantoprazole (PROTONIX) 40 MG tablet Take 1 tablet (40 mg total) by mouth daily. 03/12/17  Yes [name], MD  potassium chloride (K-DUR,KLOR-CON) 10 MEQ tablet Take 1 tablet (10 mEq total) by mouth 2 (  two) times daily. 04/27/17  Yes [name], MD  pyridostigmine (MESTINON) 60 MG tablet Take 0.5 tablets (30 mg total) by mouth 2 (two) times daily. 01/25/17  Yes [name], MD  ticagrelor (BRILINTA) 90 MG TABS tablet Take 1 tablet (90 mg total) by mouth 2 (two) times daily. 12/27/16  Yes [name], MD  warfarin (COUMADIN) 5 MG tablet Take 1 tablet (5 mg total) by mouth daily at 6 PM. 06/25/17  Yes [name], MD  traMADol (ULTRAM) 50 MG tablet Take 1 tablet (50 mg total) by mouth at bedtime as needed. Patient not taking: Reported on 08/16/2017 07/30/17   Owens Shark, NP    Family History Family History  Problem Relation Age of Onset  . Breast cancer Mother   . Breast cancer Sister   . Breast cancer Maternal Aunt   . Colon cancer Neg Hx     Social History Social History  Substance Use Topics  . Smoking status: Former Smoker    Packs/day: 0.50    Years: 50.00    Types: Cigarettes    Quit date: 11/20/2016  . Smokeless tobacco: Never Used  . Alcohol  use No     Allergies   Sulfa antibiotics   Review of Systems Review of Systems  Constitutional: Positive for chills and fever.  Eyes: Negative for visual disturbance.  Respiratory: Negative for cough and shortness of breath.   Cardiovascular: Negative for chest pain.  Gastrointestinal: Positive for anal bleeding and blood in stool. Negative for abdominal pain, constipation, diarrhea, nausea, rectal pain and vomiting.  Genitourinary: Negative for difficulty urinating, dysuria and hematuria.  Skin: Negative for rash.  Allergic/Immunologic: Positive for immunocompromised state.  Neurological: Positive for headaches. Negative for dizziness, syncope, weakness and numbness.     Physical Exam Updated Vital Signs BP (!) 159/66 (BP Location: Right Arm)   Pulse 69   Temp (!) 102.6 F (39.2 C) (Oral)   Resp 18   SpO2 99%   Physical Exam  Constitutional: She is oriented to person, place, and time. She appears well-developed and well-nourished. No distress.  HENT:  Head: Normocephalic and atraumatic.  Nose: Nose normal.  Mouth/Throat: Oropharynx is clear and moist. No oropharyngeal exudate.  Eyes: Pupils are equal, round, and reactive to light. Conjunctivae and EOM are normal.  Neck: Normal range of motion.  Neck is supple no meningeal signs   Cardiovascular: Normal rate, regular rhythm, normal heart sounds and intact distal pulses.   No murmur heard. Pulmonary/Chest: Effort normal and breath sounds normal. No respiratory distress. She has no wheezes. She has no rales. She exhibits no tenderness.  Abdominal: Soft. Bowel sounds are normal. There is no tenderness.  Soft NTND, no suprapubic or CVA tenderness No G/R/R  Genitourinary: Rectal exam shows guaiac positive stool.  Genitourinary Comments: Chaperone was present.  I was able to palpate the first 2-3cm of the rectum digitally without gross abnormalities or masses. Patient able to tolerate examination.  Patient with no pain,  fluctuance or induration in in perianal/rectal area with palpation.  There are no external fissures or external hemorrhoids noted.    +Stool color is melanotic    Musculoskeletal: Normal range of motion. She exhibits no deformity.  Lymphadenopathy:    She has no cervical adenopathy.  Neurological: She is alert and oriented to person, place, and time. No sensory deficit.  Skin: Skin is dry. Capillary refill takes less than 2 seconds.  Skin feels hot to touch  Psychiatric: She has a normal  mood and affect. Her behavior is normal. Judgment and thought content normal.  Nursing note and vitals reviewed.    ED Treatments / Results  Labs (all labs ordered are listed, but only abnormal results are displayed) Labs Reviewed  COMPREHENSIVE METABOLIC PANEL - Abnormal; Notable for the following:       Result Value   Glucose, Bld 110 (*)    BUN 22 (*)    Creatinine, Ser 1.17 (*)    Calcium 8.8 (*)    Albumin 3.4 (*)    ALT 10 (*)    GFR calc non Af Amer 46 (*)    GFR calc Af Amer 53 (*)    All other components within normal limits  CBC WITH DIFFERENTIAL/PLATELET - Abnormal; Notable for the following:    WBC 11.1 (*)    RBC 2.88 (*)    Hemoglobin 8.3 (*)    HCT 26.0 (*)    RDW 16.1 (*)    Neutro Abs 8.1 (*)    Monocytes Absolute 1.7 (*)    All other components within normal limits  URINALYSIS, ROUTINE W REFLEX MICROSCOPIC - Abnormal; Notable for the following:    Hgb urine dipstick MODERATE (*)    Bacteria, UA RARE (*)    Squamous Epithelial / LPF 0-5 (*)    All other components within normal limits  OCCULT BLOOD, POC DEVICE - Abnormal; Notable for the following:    Fecal Occult Bld POSITIVE (*)    All other components within normal limits  PROTIME-INR  I-STAT CG4 LACTIC ACID, ED  POC OCCULT BLOOD, ED  CG4 I-STAT (LACTIC ACID)  I-STAT CG4 LACTIC ACID, ED  TYPE AND SCREEN    EKG  EKG Interpretation None       Radiology Dg Chest 2 View  Result Date: 08/16/2017 CLINICAL  DATA:  Sepsis.  Weakness.  Bloody stool. EXAM: CHEST  2 VIEW COMPARISON:  06/25/2017 FINDINGS: Stable mild cardiomegaly.  Aortic atherosclerosis. Mild infiltrate is seen in the right lower lobe, suspicious for pneumonia. Left lung is clear. No evidence of pneumothorax or pleural effusion. IMPRESSION: Mild right lower lobe infiltrate, suspicious for pneumonia. Recommend clinical correlation; consider followup PA and lateral chest X-ray following trial of antibiotic therapy to ensure resolution and exclude underlying malignancy. Mild cardiomegaly. Electronically Signed   By: Earle Gell M.D.   On: 08/16/2017 16:32   Ct Abdomen Pelvis W Contrast  Result Date: 08/16/2017 CLINICAL DATA:  Headache fever and bloody stools EXAM: CT ABDOMEN AND PELVIS WITH CONTRAST TECHNIQUE: Multidetector CT imaging of the abdomen and pelvis was performed using the standard protocol following bolus administration of intravenous contrast. CONTRAST:  100 mL Isovue-300 intravenous COMPARISON:  12/08/2016, 11/19/2016, 11/18/2016, 10/27/2016 FINDINGS: Lower chest: Ground-glass density in the right lower lobe consistent with pneumonia. No pleural effusion. Coronary calcification. Borderline cardiomegaly. Hepatobiliary: No calcified gallstones. No focal hepatic abnormality. No biliary dilatation Pancreas: Unremarkable. No pancreatic ductal dilatation or surrounding inflammatory changes. Spleen: Normal in size without focal abnormality. Adrenals/Urinary Tract: Adrenal glands are within normal limits. Kidneys show no hydronephrosis. Subcentimeter hypodense lesions, too small to further characterize. Bilateral renal cysts. Intermediate density right lower pole lesion demonstrated to represent a cyst on prior MRI, no significant change in size. The bladder is unremarkable Stomach/Bowel: Stomach is nonenlarged. No dilated small bowel. No colon wall thickening. Sigmoid colon diverticula without acute inflammation Vascular/Lymphatic: Extensive  atherosclerotic calcification. No aneurysmal dilatation. Bilateral renal artery stents. No significantly enlarged abdominal or pelvic lymph nodes Reproductive: Status post hysterectomy.  No adnexal masses. Other: Negative for free air or free fluid Musculoskeletal: Possible old sacrococcygeal fracture. Stable retrolisthesis L1 on L2 and T12 on L1. Lucent lesion in T12 now contains central calcification. IMPRESSION: 1. Ground-glass opacity in the right lower lobe is suspicious for pneumonia. Imaging follow-up to resolution is recommended 2. Negative for bowel obstruction, bowel wall thickening or colitis. Scattered sigmoid colon diverticula without acute inflammation 3. Stable bilateral renal lesions 4. Stable size of 12 mm lucent lesion within the right aspect of T12 vertebral body, now with central calcifications. This was thought to possibly relate to history of myeloma. Electronically Signed   By: Donavan Foil M.D.   On: 08/16/2017 20:14    Procedures Procedures (including critical care time)  Medications Ordered in ED Medications  ceFEPIme (MAXIPIME) 1 g in dextrose 5 % 50 mL IVPB (1 g Intravenous New Bag/Given 08/16/17 2009)  vancomycin (VANCOCIN) IVPB 750 mg/150 ml premix (not administered)  vancomycin (VANCOCIN) IVPB 1000 mg/200 mL premix (not administered)  acetaminophen (TYLENOL) tablet 650 mg (650 mg Oral Given 08/16/17 1553)  sodium chloride 0.9 % bolus 1,000 mL (1,000 mLs Intravenous New Bag/Given 08/16/17 1815)  sodium chloride 0.9 % bolus 1,000 mL (1,000 mLs Intravenous New Bag/Given 08/16/17 1815)  prochlorperazine (COMPAZINE) injection 10 mg (10 mg Intravenous Given 08/16/17 1815)  pantoprazole (PROTONIX) injection 40 mg (40 mg Intravenous Given 08/16/17 1831)  iopamidol (ISOVUE-300) 61 % injection (100 mLs Intravenous Contrast Given 08/16/17 1934)     Initial Impression / Assessment and Plan / ED Course  I have reviewed the triage vital signs and the nursing notes.  Pertinent  labs & imaging results that were available during my care of the patient were reviewed by me and considered in my medical decision making (see chart for details).  Clinical Course as of Aug 16 2033  Sun Aug 16, 2017  1640 IMPRESSION: Mild right lower lobe infiltrate, suspicious for pneumonia. Recommend clinical correlation; consider followup PA and lateral chest X-ray following trial of antibiotic therapy to ensure resolution and exclude underlying malignancy.  Mild cardiomegaly.  Electronically Signed By: Earle Gell M.D. On: 08/16/2017 16:32 DG Chest 2 View [CG]  1730 Hemoglobin: (!) 8.3 [CG]  1730 Temp: (!) 102.3 F (39.1 C) [CG]  1839 Creatinine: (!) 1.17 [CG]  1839 Fecal Occult Blood, POC: (!) POSITIVE [CG]  1926 Reevaluated patient reports mild improvement in headache. Discussed workup so far, will get CT abdomen and pelvis and request admission. Had colonoscopy 2016 with diverticulosis and polyps.   [CG]  1930 WBC: (!) 11.1 [CG]    Clinical Course User Index [CG] Kinnie Feil, PA-C   71 yo female with MM on chemo and heart failure presents to ED for evaluation of melena and fever x 2 days. She is on coumadin.   On exam she is febrile, otherwise VS WNL. Abdomen is benign. Melenotic stool noted. Lungs CTAB.  Lab work remarkable for drop in Hgb at 8.3. Leukocytosis 11.1. +hemoccult. Moderate hgb in U/A w/o nitrites or leukocytes, she has no dysuria. CXR shows middle RLL infiltrate suspicious for pneumonia, although f/u x-ray recommended to r/o underlying malignancy. She has no cough, sputum, chest pain or URI symptoms.   Final Clinical Impressions(s) / ED Diagnoses   CT A/P negative for acute GI process, confirms RLL infiltrate. Will request admission for HCAP and GI bleed in immunocompromised patient on anticoagulation. PT/INR pending.   Final diagnoses:  HCAP (healthcare-associated pneumonia)  Gastrointestinal hemorrhage with melena    New  Prescriptions New  Prescriptions   No medications on file     Arlean Hopping 08/16/17 2048    Fredia Sorrow, MD 08/22/17 [place], [place], MD 08/28/17 1329

## 2017-08-16 NOTE — ED Notes (Signed)
EKG given to EDP,[place],MD.,, for review.

## 2017-08-16 NOTE — H&P (Signed)
History and Physical    Abigail [name]:[id number] DOB: Oct 21, 1945 DOA: 08/16/2017  PCP: Nolene Ebbs, MD Consultants:  Doylene Canard - cardiology; Letta Kocher - oncology Patient coming from:  Home - lives alone; NOK: [facility], [phone]  Chief Complaint: rectal bleeding  HPI: Abigail [name] is a 71 y.o. female with medical history significant of multiple myeloma; DVT on COumadin; CAD; HTN; HLD; CKD stage 3 with h/o B RAS s/p stenting; and chronic anemia presenting because she started passing a lot of blood in her stool.  This started on Friday afternoon.  It was sort of blackish-red.  She had 2 stools Friday, 3 yesterday, and 2 today.  She is occasionally light-headed or dizzy with standing.  +HA.  No h/o GI bleeding in the past.  She is on Coumadin and had her INR last checked on Wednesday.  Her Hgb was low 2 weeks ago at the cancer center.  No abdominal pain or nausea.  She denies having a fever at home.  Denies cough or congestion.  No recent SOB.  She has taken Tylenol for her headache but no NSAIDs or powders.   ED Course: Fever to 102+, melena x 2 days on Coumadin.  Hgb 8.3/  WBC 11.1.  Heme positive.  CXR with RLL infiltrate.  Review of Systems: As per HPI; otherwise review of systems reviewed and negative.   Ambulatory Status:  Ambulates without assistance  Past Medical History:  Diagnosis Date  . ACS (acute coronary syndrome) ([place]) 06/2017  . Anemia    "off and on all my life" (12/22/2016)  . Anxiety   . Bilateral renal artery stenosis ([place]) 1999   s/p stenting  . Chronic kidney disease (CKD), stage III (moderate) ([place])    Archie Endo 12/22/2016  . Coronary artery disease 1999  . Depression   . Diverticulosis   . Duodenitis   . DVT (deep venous thrombosis) ([facility])    on Coumadin  . Dyspnea   . Gastric erosions   . Gastric ulcer   . GERD (gastroesophageal reflux disease)   . Heart murmur   . History of blood transfusion ~ 03/2016   "low HgB; practically nonexistent"  .  Hyperlipidemia   . Hypertension   . MI (myocardial infarction) ([place]) 1999  . Multiple myeloma ([place]) dx'd ~ 04/2016  . PAT (paroxysmal atrial tachycardia) ([place]) 12/22/2016  . Retinal hemorrhage, right eye   . Schatzki's ring   . Tachy-brady syndrome ([place])    Archie Endo 12/22/2016  . Tubular adenoma of colon     Past Surgical History:  Procedure Laterality Date  . APPENDECTOMY    . BACK SURGERY    . CATARACT EXTRACTION W/ INTRAOCULAR LENS IMPLANT Right 2014  . CORONARY ANGIOGRAPHY N/A 01/23/2017   Procedure: Coronary Angiography;  Surgeon: [name], MD;  Location: [facility];  Service: Cardiovascular;  Laterality: N/A;  . CORONARY ANGIOPLASTY WITH STENT PLACEMENT  1999  . CORONARY STENT INTERVENTION N/A 12/29/2016   Procedure: Coronary Stent Intervention;  Surgeon: Charolette Forward, MD;  Location: [facility];  Service: Cardiovascular;  Laterality: N/A;  . CORONARY STENT INTERVENTION N/A 02/24/2017   Procedure: Coronary Stent Intervention;  Surgeon: Charolette Forward, MD;  Location: [facility];  Service: Cardiovascular;  Laterality: N/A;  . ESOPHAGOGASTRODUODENOSCOPY N/A 11/03/2015   Procedure: ESOPHAGOGASTRODUODENOSCOPY (EGD);  Surgeon: Carol Ada, MD;  Location[facility];  Service: Endoscopy;  Laterality: N/A;  . ESOPHAGOGASTRODUODENOSCOPY (EGD) WITH PROPOFOL N/A 11/18/2016   Procedure: ESOPHAGOGASTRODUODENOSCOPY (EGD) WITH  PROPOFOL;  Surgeon: Ladene Artist, MD;  Location: [facility];  Service: Endoscopy;  Laterality: N/A;  . LEFT HEART CATH AND CORONARY ANGIOGRAPHY N/A 12/24/2016   Procedure: Left Heart Cath and Coronary Angiography;  Surgeon: [name], MD;  Location: [facility];  Service: Cardiovascular;  Laterality: N/A;  . LEFT HEART CATH AND CORONARY ANGIOGRAPHY N/A 02/24/2017   Procedure: Left Heart Cath and Coronary Angiography;  Surgeon: Charolette Forward, MD;  Location: [facility];  Service: Cardiovascular;  Laterality: N/A;  . LEFT HEART CATH  AND CORONARY ANGIOGRAPHY N/A 03/05/2017   Procedure: Left Heart Cath and Coronary Angiography;  Surgeon: [name], MD;  Location: [place] [facility];  Service: Cardiovascular;  Laterality: N/A;  . LUMBAR [place]    . RENAL ARTERY STENT  1999   bil RAS so ? bil vs unilateral stents  . RETINAL LASER PROCEDURE Right 2012   "bleeding"  . TONSILLECTOMY    . TOTAL ABDOMINAL HYSTERECTOMY      Social History   Social History  . Marital status: Single    Spouse name: N/A  . Number of children: N/A  . Years of education: N/A   Occupational History  . Not on file.   Social History Main Topics  . Smoking status: Former Smoker    Packs/day: 0.50    Years: 50.00    Types: Cigarettes    Quit date: 11/20/2016  . Smokeless tobacco: Never Used  . Alcohol use No  . Drug use: No  . Sexual activity: No   Other Topics Concern  . Not on file   Social History Narrative  . No narrative on file    Allergies  Allergen Reactions  . Sulfa Antibiotics Rash    Family History  Problem Relation Age of Onset  . Breast cancer Mother   . Breast cancer Sister   . Breast cancer Maternal Aunt   . Colon cancer Neg Hx     Prior to Admission medications   Medication Sig Start Date End Date Taking? Authorizing Provider  acetaminophen (TYLENOL) 500 MG tablet Take 1,000 mg by mouth every 6 (six) hours as needed for mild pain or headache.   Yes [provider]  acyclovir (ZOVIRAX) 400 MG tablet TAKE 1 TABLET (400 MG TOTAL) BY MOUTH 2 (TWO) TIMES DAILY. 08/29/16  Yes Ladell Pier, MD  amiodarone (PACERONE) 200 MG tablet Take 0.5 tablets (100 mg total) by mouth daily. 03/12/17  Yes [name], MD  Ascorbic Acid (VITAMIN C) 1000 MG tablet Take 1,000 mg by mouth daily.    Yes [provider]  atorvastatin (LIPITOR) 80 MG tablet Take 1 tablet (80 mg total) by mouth daily at 6 PM. 03/01/17  Yes [name], MD  calcium carbonate (OS-CAL - DOSED IN MG OF ELEMENTAL CALCIUM) 1250  (500 Ca) MG tablet Take 1 tablet by mouth daily with breakfast.   Yes [provider]  Cholecalciferol (VITAMIN D PO) Take 1 capsule by mouth daily.   Yes [provider]  ferrous sulfate 325 (65 FE) MG tablet Take 1 tablet (325 mg total) by mouth daily with breakfast. 03/02/17  Yes [name], MD  furosemide (LASIX) 40 MG tablet Take 1 tablet (40 mg total) by mouth every Monday, Wednesday, and Friday. Patient taking differently: Take 40 mg by mouth as needed. For edema 04/27/17  Yes [name], MD  nitroGLYCERIN (NITROSTAT) 0.4 MG SL tablet Place 0.4 mg under the tongue every 5 (five)  minutes as needed for chest pain. 03/20/17  Yes [provider]  pantoprazole (PROTONIX) 40 MG tablet Take 1 tablet (40 mg total) by mouth daily. 03/12/17  Yes [name], MD  potassium chloride (K-DUR,KLOR-CON) 10 MEQ tablet Take 1 tablet (10 mEq total) by mouth 2 (two) times daily. 04/27/17  Yes [name], MD  pyridostigmine (MESTINON) 60 MG tablet Take 0.5 tablets (30 mg total) by mouth 2 (two) times daily. 01/25/17  Yes [name], MD  ticagrelor (BRILINTA) 90 MG TABS tablet Take 1 tablet (90 mg total) by mouth 2 (two) times daily. 12/27/16  Yes [name], MD  warfarin (COUMADIN) 5 MG tablet Take 1 tablet (5 mg total) by mouth daily at 6 PM. 06/25/17  Yes [name], MD  traMADol (ULTRAM) 50 MG tablet Take 1 tablet (50 mg total) by mouth at bedtime as needed. Patient not taking: Reported on 08/16/2017 07/30/17   Owens Shark, NP    Physical Exam: Vitals:   08/16/17 2009 08/16/17 2030 08/16/17 2200 08/16/17 2303  BP: (!) 159/66 (!) 164/68 (!) 166/74 (!) 149/70  Pulse: 69 66 73 88  Resp: '18 18  18  ' Temp:    (!) 101.7 F (38.7 C)  TempSrc:    Oral  SpO2: 99% 99% 94% 97%     General:  Appears calm and comfortable and is NAD Eyes:  PERRL, EOMI, normal lids, iris ENT:  grossly normal hearing, lips & tongue, mmm; poor dentition Neck:  no LAD, masses or  thyromegaly Cardiovascular:  RRR, no m/r/g. No LE edema.  Respiratory:   CTA bilaterally with no wheezes/rales/rhonchi.  Normal respiratory effort. Abdomen:  soft, NT, ND, NABS Back:   normal alignment, no CVAT Skin:  no rash or induration seen on limited exam Musculoskeletal:  grossly normal tone BUE/BLE, good ROM, no bony abnormality Lower extremity:  No LE edema.  2+ distal pulses. Psychiatric:  grossly normal mood and affect, speech fluent and appropriate, AOx3 Neurologic:  CN 2-12 grossly intact, moves all extremities in coordinated fashion, sensation intact    Radiological Exams on Admission: Dg Chest 2 View  Result Date: 08/16/2017 CLINICAL DATA:  Sepsis.  Weakness.  Bloody stool. EXAM: CHEST  2 VIEW COMPARISON:  06/25/2017 FINDINGS: Stable mild cardiomegaly.  Aortic atherosclerosis. Mild infiltrate is seen in the right lower lobe, suspicious for pneumonia. Left lung is clear. No evidence of pneumothorax or pleural effusion. IMPRESSION: Mild right lower lobe infiltrate, suspicious for pneumonia. Recommend clinical correlation; consider followup PA and lateral chest X-ray following trial of antibiotic therapy to ensure resolution and exclude underlying malignancy. Mild cardiomegaly. Electronically Signed   By: Earle Gell M.D.   On: 08/16/2017 16:32   Ct Abdomen Pelvis W Contrast  Result Date: 08/16/2017 CLINICAL DATA:  Headache fever and bloody stools EXAM: CT ABDOMEN AND PELVIS WITH CONTRAST TECHNIQUE: Multidetector CT imaging of the abdomen and pelvis was performed using the standard protocol following bolus administration of intravenous contrast. CONTRAST:  100 mL Isovue-300 intravenous COMPARISON:  12/08/2016, 11/19/2016, 11/18/2016, 10/27/2016 FINDINGS: Lower chest: Ground-glass density in the right lower lobe consistent with pneumonia. No pleural effusion. Coronary calcification. Borderline cardiomegaly. Hepatobiliary: No calcified gallstones. No focal hepatic abnormality. No  biliary dilatation Pancreas: Unremarkable. No pancreatic ductal dilatation or surrounding inflammatory changes. Spleen: Normal in size without focal abnormality. Adrenals/Urinary Tract: Adrenal glands are within normal limits. Kidneys show no hydronephrosis. Subcentimeter hypodense lesions, too small to further characterize. Bilateral renal cysts. Intermediate density right lower pole lesion demonstrated to  represent a cyst on prior MRI, no significant change in size. The bladder is unremarkable Stomach/Bowel: Stomach is nonenlarged. No dilated small bowel. No colon wall thickening. Sigmoid colon diverticula without acute inflammation Vascular/Lymphatic: Extensive atherosclerotic calcification. No aneurysmal dilatation. Bilateral renal artery stents. No significantly enlarged abdominal or pelvic lymph nodes Reproductive: Status post hysterectomy. No adnexal masses. Other: Negative for free air or free fluid Musculoskeletal: Possible old sacrococcygeal fracture. Stable retrolisthesis L1 on L2 and T12 on L1. Lucent lesion in T12 now contains central calcification. IMPRESSION: 1. Ground-glass opacity in the right lower lobe is suspicious for pneumonia. Imaging follow-up to resolution is recommended 2. Negative for bowel obstruction, bowel wall thickening or colitis. Scattered sigmoid colon diverticula without acute inflammation 3. Stable bilateral renal lesions 4. Stable size of 12 mm lucent lesion within the right aspect of T12 vertebral body, now with central calcifications. This was thought to possibly relate to history of myeloma. Electronically Signed   By: Donavan Foil M.D.   On: 08/16/2017 20:14    EKG: Independently reviewed.  NSR with rate 81; nonspecific ST changes with no evidence of acute ischemia   Labs on Admission: I have personally reviewed the available labs and imaging studies at the time of the admission.  Pertinent labs:   Lactate 0.96 Heme positive Glucose 110 BUN 22/Creatinine  1.17/GFR 53 - stable Albumin 3.4 WBC 11.1 Hgb 8.3; prior 10.6 on 10/11 UA reasurring  Assessment/Plan Principal Problem:   GI bleed Active Problems:   Multiple myeloma (HCC)   Essential hypertension   Fever   Sepsis (H[facility])   GI bleed -Differential to include bleeding hemorrhoids, bacterial infectious colitis, diverticular bleeding, and AVM.  -She is febrile at this time (see below) and while PNA is technically possible, her only symptoms are GI-related; will treat with Zosyn monotherapy for now. -Negative CT -Admit to Med Surg -CBC q6h; transfuse for Hgb <7 -Continue to monitory for recurrent bleeding  -If ongoing bleeding, she may need GI consultation -EGD 11/18/16 showed grade D reflux esophagitis, but her current presentation is more consistent with lower tract pathology by description -Type and screen were done in ED.  - NPO for possible EGD - NS at 125 mL/hr - Start IV pantoprazole 40 mg bid - Zofran IV for nausea - Avoid NSAIDs and SQ heparin - Maintain IV access (2 large bore IVs if possible). -For now, hold Brilinta and Coumadin  Sepsis -Elevated WBC count, fever -While awaiting blood cultures, this appears to be a preseptic condition. -Sepsis protocol initiated -Suspect abdominal source - while her CXR and chest CT were suspicious for RLL PNA, the patient has no symptoms associated with PNA and so will not continue to treat at this time  -Blood cultures pending -Treat with IV Zosyn empirically for now  Multiple myeloma -Patient reports being in remission -She was being treated with Revlimid but it appears that this treatment was placed on hold due to CAD -Dr. Benay Spice added to the treatment team -Continue acyclovir   DVT prophylaxis:  SCDs Code Status:  Full - confirmed with patient Family Communication: None present Disposition Plan:  Home once clinically improved Consults called: Dr. Benay Spice added to treatment team; may need GI consult Admission status:  Admit - It is my clinical opinion that admission to INPATIENT is reasonable and necessary because this patient will require at least 2 midnights in the hospital to treat this condition based on the medical complexity of the problems presented.  Given the aforementioned information, the predictability  of an adverse outcome is felt to be significant.     Karmen Bongo MD [facility]  If note is complete, please contact covering daytime or nighttime physician. [email or web address] Password [email or web address]  08/17/2017, 12:54 AM

## 2017-08-16 NOTE — Progress Notes (Signed)
Pharmacy Antibiotic Note  Abigail [name] is a 71 y.o. female admitted on 08/16/2017 with pneumonia. Pt has a hx of multiple myeloma, CAD s/p stent, colon polyps, DVT on coumadin and heart failure present to evaluation of black stools, fever and HA.  Pharmacy has been consulted for vancomycin dosing. Scr 1.17, CrCl ~ 48.8mls/min   Plan: Vancomycin 1gm IV x 1 then 750mg IV q12h Cefepime 1mg IV x 1 per MD Follow up renal function, cultures and clinical course     Temp (24hrs), Avg:102.5 F (39.2 C), Min:102.3 F (39.1 C), Max:102.6 F (39.2 C)   Recent Labs Lab 08/16/17 1656 08/16/17 1712  WBC 11.1*  --   CREATININE 1.17*  --   LATICACIDVEN  --  0.96    CrCl cannot be calculated (Unknown ideal weight.).    Allergies  Allergen Reactions  . Sulfa Antibiotics Rash    Antimicrobials this admission: 10/28 >> vanc  10/28 cefepime x 1 Dose adjustments this admission:   Microbiology results:   Thank you for allowing pharmacy to be a part of this patient's care.  Ellen Jackson RPh 08/16/2017, 7:53 PM Pager [phone]  

## 2017-08-17 DIAGNOSIS — K922 Gastrointestinal hemorrhage, unspecified: Secondary | ICD-10-CM | POA: Diagnosis present

## 2017-08-17 DIAGNOSIS — I10 Essential (primary) hypertension: Secondary | ICD-10-CM

## 2017-08-17 DIAGNOSIS — A419 Sepsis, unspecified organism: Secondary | ICD-10-CM | POA: Diagnosis present

## 2017-08-17 LAB — BASIC METABOLIC PANEL
ANION GAP: 10 (ref 5–15)
ANION GAP: 9 (ref 5–15)
BUN: 19 mg/dL (ref 6–20)
BUN: 18 mg/dL (ref 6–20)
CALCIUM: 8.4 mg/dL — AB (ref 8.9–10.3)
CALCIUM: 7.4 mg/dL — AB (ref 8.9–10.3)
CO2: 19 mmol/L — ABNORMAL LOW (ref 22–32)
CO2: 18 mmol/L — ABNORMAL LOW (ref 22–32)
Chloride: 111 mmol/L (ref 101–111)
Chloride: 112 mmol/L — ABNORMAL HIGH (ref 101–111)
Creatinine, Ser: 0.99 mg/dL (ref 0.44–1.00)
Creatinine, Ser: 0.97 mg/dL (ref 0.44–1.00)
GFR calc Af Amer: >60 mL/min (ref >60)
GFR calc Af Amer: >60 mL/min (ref >60)
GFR calc non Af Amer: 56 mL/min — ABNORMAL LOW (ref >60)
GFR, EST NON AFRICAN AMERICAN: 58 mL/min — AB (ref >60)
GLUCOSE: 108 mg/dL — AB (ref 65–99)
Glucose, Bld: 96 mg/dL (ref 65–99)
POTASSIUM: 4.1 mmol/L (ref 3.5–5.1)
POTASSIUM: 3.7 mmol/L (ref 3.5–5.1)
SODIUM: 139 mmol/L (ref 135–145)
Sodium: 140 mmol/L (ref 135–145)

## 2017-08-17 LAB — CBC
HCT: 22.1 % — ABNORMAL LOW (ref 36.0–46.0)
HCT: 26.7 % — ABNORMAL LOW (ref 36.0–46.0)
HEMATOCRIT: 30.9 % — AB (ref 36.0–46.0)
HEMOGLOBIN: 9.4 g/dL — AB (ref 12.0–15.0)
Hemoglobin: 7.3 g/dL — ABNORMAL LOW (ref 12.0–15.0)
Hemoglobin: 8.6 g/dL — ABNORMAL LOW (ref 12.0–15.0)
MCH: 26.7 pg (ref 26.0–34.0)
MCH: 29 pg (ref 26.0–34.0)
MCH: 29.1 pg (ref 26.0–34.0)
MCHC: 30.4 g/dL (ref 30.0–36.0)
MCHC: 32.2 g/dL (ref 30.0–36.0)
MCHC: 33 g/dL (ref 30.0–36.0)
MCV: 87.8 fL (ref 78.0–100.0)
MCV: 88 fL (ref 78.0–100.0)
MCV: 89.9 fL (ref 78.0–100.0)
PLATELETS: 274 10*3/uL (ref 150–400)
Platelets: 223 10*3/uL (ref 150–400)
Platelets: 262 10*3/uL (ref 150–400)
RBC: 2.51 MIL/uL — ABNORMAL LOW (ref 3.87–5.11)
RBC: 2.97 MIL/uL — ABNORMAL LOW (ref 3.87–5.11)
RBC: 3.52 MIL/uL — ABNORMAL LOW (ref 3.87–5.11)
RDW: 15.9 % — ABNORMAL HIGH (ref 11.5–15.5)
RDW: 16 % — AB (ref 11.5–15.5)
RDW: 16.2 % — AB (ref 11.5–15.5)
WBC: 11 10*3/uL — ABNORMAL HIGH (ref 4.0–10.5)
WBC: 12.3 10*3/uL — ABNORMAL HIGH (ref 4.0–10.5)
WBC: 9 10*3/uL (ref 4.0–10.5)

## 2017-08-17 LAB — LACTIC ACID, PLASMA
LACTIC ACID, VENOUS: 1 mmol/L (ref 0.5–1.9)
LACTIC ACID, VENOUS: 0.7 mmol/L (ref 0.5–1.9)
Lactic Acid, Venous: 1.7 mmol/L (ref 0.5–1.9)

## 2017-08-17 LAB — PROCALCITONIN: Procalcitonin: <0.1 ng/mL

## 2017-08-17 LAB — PROTIME-INR
INR: 8.05
Prothrombin Time: 66.9 seconds — ABNORMAL HIGH (ref 11.4–15.2)

## 2017-08-17 LAB — PREPARE RBC (CROSSMATCH)

## 2017-08-17 LAB — MRSA PCR SCREENING: MRSA BY PCR: NEGATIVE

## 2017-08-17 MED ORDER — SODIUM CHLORIDE 0.9 % IV BOLUS (SEPSIS)
250.0000 mL | Freq: Once | INTRAVENOUS | Status: AC
  Administered 2017-08-17: 250 mL via INTRAVENOUS

## 2017-08-17 MED ORDER — VITAMIN K1 10 MG/ML IJ SOLN
5.0000 mg | Freq: Once | INTRAMUSCULAR | Status: AC
  Administered 2017-08-17: 5 mg via SUBCUTANEOUS
  Filled 2017-08-17: qty 0.5

## 2017-08-17 MED ORDER — SODIUM CHLORIDE 0.9 % IV SOLN: Freq: Once | INTRAVENOUS | Status: DC

## 2017-08-17 MED ORDER — DEXTROSE 5 % IV SOLN
1.0000 g | Freq: Three times a day (TID) | INTRAVENOUS | Status: DC
  Administered 2017-08-17 – 2017-08-20 (×9): 1 g via INTRAVENOUS
  Filled 2017-08-17 (×10): qty 1

## 2017-08-17 MED ORDER — SODIUM CHLORIDE 0.9 % IV SOLN
INTRAVENOUS | Status: AC
  Administered 2017-08-17 – 2017-08-18 (×2): via INTRAVENOUS

## 2017-08-17 MED ORDER — SODIUM CHLORIDE 0.9% FLUSH
10.0000 mL | INTRAVENOUS | Status: DC | PRN
  Administered 2017-08-21: 10 mL
  Filled 2017-08-17: qty 40

## 2017-08-17 MED ORDER — VANCOMYCIN HCL IN DEXTROSE 1-5 GM/200ML-% IV SOLN
1000.0000 mg | INTRAVENOUS | Status: DC
  Filled 2017-08-17: qty 200

## 2017-08-17 MED ORDER — SODIUM CHLORIDE 0.9 % IV SOLN
1500.0000 mg | Freq: Once | INTRAVENOUS | Status: AC
  Administered 2017-08-17: 1500 mg via INTRAVENOUS
  Filled 2017-08-17: qty 1500

## 2017-08-17 MED ORDER — MORPHINE SULFATE (PF) 4 MG/ML IV SOLN
2.0000 mg | INTRAVENOUS | Status: DC | PRN
  Administered 2017-08-17 – 2017-08-21 (×10): 2 mg via INTRAVENOUS
  Filled 2017-08-17 (×10): qty 1

## 2017-08-17 MED ORDER — SODIUM CHLORIDE 0.9 % IV BOLUS (SEPSIS)
1000.0000 mL | Freq: Once | INTRAVENOUS | Status: AC
  Administered 2017-08-17: 1000 mL via INTRAVENOUS

## 2017-08-17 MED ORDER — SODIUM CHLORIDE 0.9 % IV SOLN: INTRAVENOUS | Status: DC

## 2017-08-17 NOTE — Progress Notes (Signed)
Dr Aileen Fass notified of Hbg and temp of 102.

## 2017-08-17 NOTE — Progress Notes (Signed)
Patient transferred to 1229 via bed. Report given to receiving nurse. Donne Hazel, RN

## 2017-08-17 NOTE — Progress Notes (Addendum)
Pharmacy Antibiotic Note  Abigail [name] is a 71 y.o. female admitted on 08/16/2017 with intra-abdominal infection/pneumonia. Pt has a hx of multiple myeloma, CAD s/p stent, colon polyps, DVT on coumadin and heart failure present to evaluation of black stools, fever and HA.  Pharmacy was initially consulted for vancomycin dosing. Upon admission, Vancomycin d/c'ed and pharmacy consulted to dose Zosyn.    Plan: Zosyn 3.375gm IV q8h (each dose infused over 4 hrs) Follow up renal function, cultures and clinical course  Weight: 158 lb 11.7 oz (72 kg)  Temp (24hrs), Avg:102.2 F (39 C), Min:101.7 F (38.7 C), Max:102.6 F (39.2 C)   Recent Labs Lab 08/16/17 1656 08/16/17 1712 08/17/17 0044  WBC 11.1*  --  12.3*  CREATININE 1.17*  --   --   LATICACIDVEN  --  0.96 1.7    Estimated Creatinine Clearance: 44.5 mL/min (A) (by C-G formula based on SCr of 1.17 mg/dL (H)).    Allergies  Allergen Reactions  . Sulfa Antibiotics Rash    Antimicrobials this admission: 10/28 cefepime x 1 10/29 >> zosyn >> Dose adjustments this admission:   Microbiology results: 10/29 Blood Cx: sent  Thank you for allowing pharmacy to be a part of this patient's care.  Leone Haven, PharmD 08/17/2017, 2:34 AM

## 2017-08-17 NOTE — Progress Notes (Signed)
IV Team notified to restart IV access on patient due to patient c/o about previous site in [place], noted to be swelling and bleeding around insertion site. Pt alert & oriented, states having difficulty holding urine, external cath placed at this time. Pt answering all questions , acknowledges understanding of person, place and time.

## 2017-08-17 NOTE — Progress Notes (Signed)
Pharmacy Antibiotic Note  Abigail [name] is a 71 y.o. female admitted on 08/16/2017 with HCAP.  Pharmacy has been consulted for Cefepime dosing.  Plan:  Cefepime 1g IV q8 hr  F/u SCr   Weight: 158 lb 11.7 oz (72 kg)  Temp (24hrs), Avg:101.8 F (38.8 C), Min:100.5 F (38.1 C), Max:102.6 F (39.2 C)   Recent Labs Lab 08/16/17 1656 08/16/17 1712 08/17/17 0044 08/17/17 0550  WBC 11.1*  --  12.3* 9.0  CREATININE 1.17*  --   --  0.99  LATICACIDVEN  --  0.96 1.7 1.0    Estimated Creatinine Clearance: 52.6 mL/min (by C-G formula based on SCr of 0.99 mg/dL).    Allergies  Allergen Reactions  . Sulfa Antibiotics Rash     Thank you for allowing pharmacy to be a part of this patient's care.  Reuel Boom, PharmD, BCPS Pager: [phone] 08/17/2017, 1:54 PM

## 2017-08-17 NOTE — Progress Notes (Addendum)
[facility] PROGRESS NOTE    Progress Note  Abigail [name]  HXT:[id number] DOB: [birth year] DOA: 08/16/2017 PCP: Nolene Ebbs, MD     Brief Narrative:   Abigail [name] is an 71 y.o. female past medical history significant for multiple myeloma, DVT on Coumadin, chronic kidney disease stage III, coronary artery sees status post stenting on Coumadin and Brillinta, with systolic heart failure with ejection fraction 40%, chronic anemia presents after passing bright red blood per rectum that started Friday in the afternoon she relates lightheadedness upon standing. INR was checked in the ED that was 8, her previous hemoglobin ranges around 10, on admission 9.4, she was also found to have fever and 11. White count on admission, she was started empirically on antibiotics.  Assessment/Plan:   GI bleed: - Continue to monitor hemoglobin every 12 hours. - CBC at noon came back with hemoglobin is 7.8, I received a call from the nurse that she has had 5 bowel movements, since  I saw her this morning. - I will go ahead and give her a liter of normal saline continue her at 45. - Transferred to the step down. - Transfuse 1 unit of fresh frozen plasma, she has already received 5 mg of vitamin K subcutaneously. - Will transfuse 2 units of packed red blood cells should a CBC posttransfusion. - Continue to hold [place] Coumadin. - Place her on nothing by mouth. - Last colonoscopy 2016 showed 2 polyps and diverticulosis, this probably a diverticular bleed in the setting of Brillinta and supratherapeutic INR. - Check lactic level  Sepsis due to pneumonia: White count elevated, fever and crackles on the right middle lobe on physical exam. Chest x-ray and CT show possible right lower pneumonia Culture data is pending. Empirically on IV vancomycin and Zosyn, DC Zosyn start cefepime. Culture data is pending.  Multiple myeloma (HCC) In remission, continue acyclovir.  Essential hypertension Hold  antihypertensive medication.   DVT prophylaxis: scd's Family Communication: none Disposition Plan/Barrier to D/C: none Code Status:     Code Status Orders        Start     Ordered   08/16/17 2335  Full code  Continuous     08/16/17 2334    Code Status History    Date Active Date Inactive Code Status Order ID Comments User Context   06/23/2017 12:13 PM 06/25/2017  3:16 PM Full Code [id number]  [name], MD ED   04/02/2017  9:56 AM 04/08/2017  7:01 PM Full Code [id number]  [name], MD ED   03/04/2017  8:08 AM 03/12/2017  5:28 PM Full Code [id number]  [name], MD ED   02/24/2017 11:34 PM 03/01/2017  7:31 PM Full Code [id number]  Charolette Forward, MD Inpatient   02/24/2017 11:33 PM 02/24/2017 11:34 PM Full Code [id number]  Doylene Canning, MD Inpatient   01/21/2017  5:38 PM 01/25/2017  6:34 PM Full Code [id number]  [name], MD Inpatient   12/29/2016  2:15 PM 01/02/2017 10:39 PM Full Code [id number]  Charolette Forward, MD Inpatient   12/22/2016  7:52 PM 12/29/2016  2:15 PM Full Code [id number]  [name], MD Inpatient   12/06/2016  7:56 PM 12/09/2016  8:08 PM Full Code [id number]  Rosita Fire, MD Inpatient   11/23/2016  4:44 PM 11/27/2016  6:19 PM Full Code [id number]  Elgergawy, Silver Huguenin, MD Inpatient   11/17/2016  2:04 PM 11/21/2016  8:08 PM Full Code [id number]  Patrecia Pour, MD Inpatient   06/12/2016  3:56 PM 06/19/2016 10:03 PM Full Code [id number]  [name], MD Inpatient   01/11/2016  4:08 PM 01/14/2016  2:20 PM Full Code [id number]  [name], MD Inpatient   11/01/2015  5:35 PM 11/04/2015  5:38 PM Full Code [id number]  [name], MD Inpatient   12/31/2011  4:05 AM 01/02/2012  4:40 PM Full Code [id number]  Freddy Jaksch, RN Inpatient        IV Access:    Peripheral IV   Procedures and diagnostic studies:   Dg Chest 2 View  Result Date: 08/16/2017 CLINICAL DATA:  Sepsis.  Weakness.  Bloody stool. EXAM: CHEST  2 VIEW COMPARISON:  06/25/2017 FINDINGS: Stable mild  cardiomegaly.  Aortic atherosclerosis. Mild infiltrate is seen in the right lower lobe, suspicious for pneumonia. Left lung is clear. No evidence of pneumothorax or pleural effusion. IMPRESSION: Mild right lower lobe infiltrate, suspicious for pneumonia. Recommend clinical correlation; consider followup PA and lateral chest X-ray following trial of antibiotic therapy to ensure resolution and exclude underlying malignancy. Mild cardiomegaly. Electronically Signed   By: Earle Gell M.D.   On: 08/16/2017 16:32   Ct Abdomen Pelvis W Contrast  Result Date: 08/16/2017 CLINICAL DATA:  Headache fever and bloody stools EXAM: CT ABDOMEN AND PELVIS WITH CONTRAST TECHNIQUE: Multidetector CT imaging of the abdomen and pelvis was performed using the standard protocol following bolus administration of intravenous contrast. CONTRAST:  100 mL Isovue-300 intravenous COMPARISON:  12/08/2016, 11/19/2016, 11/18/2016, 10/27/2016 FINDINGS: Lower chest: Ground-glass density in the right lower lobe consistent with pneumonia. No pleural effusion. Coronary calcification. Borderline cardiomegaly. Hepatobiliary: No calcified gallstones. No focal hepatic abnormality. No biliary dilatation Pancreas: Unremarkable. No pancreatic ductal dilatation or surrounding inflammatory changes. Spleen: Normal in size without focal abnormality. Adrenals/Urinary Tract: Adrenal glands are within normal limits. Kidneys show no hydronephrosis. Subcentimeter hypodense lesions, too small to further characterize. Bilateral renal cysts. Intermediate density right lower pole lesion demonstrated to represent a cyst on prior MRI, no significant change in size. The bladder is unremarkable Stomach/Bowel: Stomach is nonenlarged. No dilated small bowel. No colon wall thickening. Sigmoid colon diverticula without acute inflammation Vascular/Lymphatic: Extensive atherosclerotic calcification. No aneurysmal dilatation. Bilateral renal artery stents. No significantly  enlarged abdominal or pelvic lymph nodes Reproductive: Status post hysterectomy. No adnexal masses. Other: Negative for free air or free fluid Musculoskeletal: Possible old sacrococcygeal fracture. Stable retrolisthesis L1 on L2 and T12 on L1. Lucent lesion in T12 now contains central calcification. IMPRESSION: 1. Ground-glass opacity in the right lower lobe is suspicious for pneumonia. Imaging follow-up to resolution is recommended 2. Negative for bowel obstruction, bowel wall thickening or colitis. Scattered sigmoid colon diverticula without acute inflammation 3. Stable bilateral renal lesions 4. Stable size of 12 mm lucent lesion within the right aspect of T12 vertebral body, now with central calcifications. This was thought to possibly relate to history of myeloma. Electronically Signed   By: Donavan Foil M.D.   On: 08/16/2017 20:14     Medical Consultants:    None.  Anti-Infectives:   IV vancomycin and cefepime.  Subjective:    Kashana P Roedel labile emotion due to everything the skull and on, she does relate she had 2 bloody bowel movements today  Objective:    Vitals:   08/16/17 2200 08/16/17 2303 08/17/17 0219 08/17/17 0450  BP: (!) 166/74 (!) 149/70  (!) 146/52  Pulse: 73 88  (!) 56  Resp:  18  16  Temp:  (!) 101.7 F (38.7 C)  (!) 100.5  F (38.1 C)  TempSrc:  Oral  Oral  SpO2: 94% 97%  94%  Weight:   72 kg (158 lb 11.7 oz)     Intake/Output Summary (Last 24 hours) at 08/17/17 0926 Last data filed at 08/17/17 0636  Gross per 24 hour  Intake             1050 ml  Output              500 ml  Net              550 ml   Filed Weights   08/17/17 0219  Weight: 72 kg (158 lb 11.7 oz)    Exam: General exam: In no acute distress. Respiratory system: Good air movement and clear to auscultation. Cardiovascular system: S1 & S2 heard, RRR. No JVD, murmurs, rubs, gallops or clicks.  Gastrointestinal system: Abdomen is nondistended, soft and nontender.  Central nervous system:  Alert and oriented. No focal neurological deficits. Extremities: No pedal edema. Skin: No rashes, lesions or ulcers Psychiatry: Judgement and insight appear normal. Mood & affect appropriate.    Data Reviewed:    Labs: Basic Metabolic Panel:  Recent Labs Lab 08/16/17 1656 08/17/17 0550  NA 142 140  K 5.0 4.1  CL 110 111  CO2 23 19*  GLUCOSE 110* 108*  BUN 22* 19  CREATININE 1.17* 0.99  CALCIUM 8.8* 8.4*   GFR Estimated Creatinine Clearance: 52.6 mL/min (by C-G formula based on SCr of 0.99 mg/dL). Liver Function Tests:  Recent Labs Lab 08/16/17 1656  AST 22  ALT 10*  ALKPHOS 71  BILITOT 0.9  PROT 7.1  ALBUMIN 3.4*   No results for input(s): LIPASE, AMYLASE in the last 168 hours. No results for input(s): AMMONIA in the last 168 hours. Coagulation profile  Recent Labs Lab 08/17/17 0550  INR 8.05*    CBC:  Recent Labs Lab 08/16/17 1656 08/17/17 0044 08/17/17 0550  WBC 11.1* 12.3* 9.0  NEUTROABS 8.1*  --   --   HGB 8.3* 8.6* 9.4*  HCT 26.0* 26.7* 30.9*  MCV 90.3 89.9 87.8  PLT 274 274 223   Cardiac Enzymes: No results for input(s): CKTOTAL, CKMB, CKMBINDEX, TROPONINI in the last 168 hours. BNP (last 3 results) No results for input(s): PROBNP in the last 8760 hours. CBG: No results for input(s): GLUCAP in the last 168 hours. D-Dimer: No results for input(s): DDIMER in the last 72 hours. Hgb A1c: No results for input(s): HGBA1C in the last 72 hours. Lipid Profile: No results for input(s): CHOL, HDL, LDLCALC, TRIG, CHOLHDL, LDLDIRECT in the last 72 hours. Thyroid function studies: No results for input(s): TSH, T4TOTAL, T3FREE, THYROIDAB in the last 72 hours.  Invalid input(s): FREET3 Anemia work up: No results for input(s): VITAMINB12, FOLATE, FERRITIN, TIBC, IRON, RETICCTPCT in the last 72 hours. Sepsis Labs:  Recent Labs Lab 08/16/17 1656 08/16/17 1712 08/17/17 0044 08/17/17 0550  PROCALCITON  --   --  <0.10  --   WBC 11.1*  --   12.3* 9.0  LATICACIDVEN  --  0.96 1.7 1.0   Microbiology No results found for this or any previous visit (from the past 240 hour(s)).   Medications:   . acyclovir  400 mg Oral BID  . amiodarone  100 mg Oral Daily  . atorvastatin  80 mg Oral q1800  . pantoprazole  40 mg Intravenous Q12H  . pyridostigmine  30 mg Oral BID   Continuous Infusions: . sodium chloride    .  piperacillin-tazobactam (ZOSYN)  IV 3.375 g (08/17/17 0845)      LOS: 1 day   Charlynne Cousins  [facility] Pager [phone]  *Please refer to [email or web address], password [email or web address] to get updated schedule on who will round on this patient, as hospitalists switch teams weekly. If 7PM-7AM, please contact night-coverage at [email or web address], password [email or web address] for any overnight needs.  08/17/2017, 9:26 AM

## 2017-08-17 NOTE — Progress Notes (Signed)
Dr Aileen Fass paged to make him aware that patient has had five bloody stools since we spoke this morning. Donne Hazel, RN

## 2017-08-17 NOTE — Progress Notes (Signed)
Message sent to [facility], NP covering for [facility]. Pt still has not been able to obtain IV access. INR is 8.05. Awaiting further orders.

## 2017-08-17 NOTE — Progress Notes (Signed)
Pharmacy Antibiotic Note  Abigail [name] is a 71 y.o. female admitted on 08/16/2017 with HCAP.  Pharmacy has been consulted for Cefepime and vancomycin dosing.  Today, 08/17/2017  Renal: Scr improved from admission  Plan:  Vancomycin 1500mg  x then 1gm IV q24h  Check trough if remains on vancomycin   Cefepime 1g IV q8 hr  F/u SCr   Weight: 158 lb 11.7 oz (72 kg)  Temp (24hrs), Avg:101.8 F (38.8 C), Min:100.5 F (38.1 C), Max:102.6 F (39.2 C)   Recent Labs Lab 08/16/17 1656 08/16/17 1712 08/17/17 0044 08/17/17 0550 08/17/17 1412  WBC 11.1*  --  12.3* 9.0 11.0*  CREATININE 1.17*  --   --  0.99  --   LATICACIDVEN  --  0.96 1.7 1.0  --     Estimated Creatinine Clearance: 52.6 mL/min (by C-G formula based on SCr of 0.99 mg/dL).    Allergies  Allergen Reactions  . Sulfa Antibiotics Rash     Thank you for allowing pharmacy to be a part of this patient's care.  Doreene Eland, PharmD, BCPS.   Pager: [phone] 08/17/2017 4:13 PM

## 2017-08-17 NOTE — Progress Notes (Signed)
[facility] response called and report given. RN is coming as soon as she can and reports pt room is almost ready in step down room 1225. Donne Hazel, RN

## 2017-08-18 ENCOUNTER — Inpatient Hospital Stay (HOSPITAL_COMMUNITY): Payer: Medicare HMO

## 2017-08-18 DIAGNOSIS — Z86718 Personal history of other venous thrombosis and embolism: Secondary | ICD-10-CM

## 2017-08-18 LAB — CREATININE, SERUM
CREATININE: 1.03 mg/dL — AB (ref 0.44–1.00)
GFR calc Af Amer: >60 mL/min (ref >60)
GFR calc non Af Amer: 54 mL/min — ABNORMAL LOW (ref >60)

## 2017-08-18 LAB — CBC
HCT: 26.4 % — ABNORMAL LOW (ref 36.0–46.0)
HEMATOCRIT: 25.7 % — AB (ref 36.0–46.0)
HEMOGLOBIN: 8.6 g/dL — AB (ref 12.0–15.0)
HEMOGLOBIN: 8.9 g/dL — AB (ref 12.0–15.0)
MCH: 28.9 pg (ref 26.0–34.0)
MCH: 28.8 pg (ref 26.0–34.0)
MCHC: 33.5 g/dL (ref 30.0–36.0)
MCHC: 33.7 g/dL (ref 30.0–36.0)
MCV: 86.2 fL (ref 78.0–100.0)
MCV: 85.4 fL (ref 78.0–100.0)
Platelets: 231 10*3/uL (ref 150–400)
Platelets: 254 10*3/uL (ref 150–400)
RBC: 2.98 MIL/uL — AB (ref 3.87–5.11)
RBC: 3.09 MIL/uL — AB (ref 3.87–5.11)
RDW: 15.2 % (ref 11.5–15.5)
RDW: 15.6 % — ABNORMAL HIGH (ref 11.5–15.5)
WBC: 11.1 10*3/uL — ABNORMAL HIGH (ref 4.0–10.5)
WBC: 11.2 10*3/uL — AB (ref 4.0–10.5)

## 2017-08-18 LAB — PROTIME-INR
INR: 1.84
PROTHROMBIN TIME: 21.1 s — AB (ref 11.4–15.2)

## 2017-08-18 MED ORDER — ASPIRIN 81 MG PO CHEW
81.0000 mg | CHEWABLE_TABLET | Freq: Every day | ORAL | Status: DC
  Administered 2017-08-18 – 2017-08-21 (×4): 81 mg via ORAL
  Filled 2017-08-18 (×4): qty 1

## 2017-08-18 MED ORDER — FUROSEMIDE 40 MG PO TABS
40.0000 mg | ORAL_TABLET | Freq: Every day | ORAL | Status: DC
Start: 2017-08-18 — End: 2017-08-18

## 2017-08-18 MED ORDER — CHLORHEXIDINE GLUCONATE CLOTH 2 % EX PADS
6.0000 | MEDICATED_PAD | Freq: Every day | CUTANEOUS | Status: DC
  Administered 2017-08-18 – 2017-08-19 (×2): 6 via TOPICAL

## 2017-08-18 MED ORDER — VITAMINS A & D EX OINT
TOPICAL_OINTMENT | CUTANEOUS | Status: AC
  Filled 2017-08-18: qty 5

## 2017-08-18 MED ORDER — PANTOPRAZOLE SODIUM 40 MG PO TBEC
40.0000 mg | DELAYED_RELEASE_TABLET | Freq: Every day | ORAL | Status: DC
  Administered 2017-08-19 – 2017-08-21 (×3): 40 mg via ORAL
  Filled 2017-08-18 (×3): qty 1

## 2017-08-18 MED ORDER — FUROSEMIDE 10 MG/ML IJ SOLN
40.0000 mg | Freq: Two times a day (BID) | INTRAMUSCULAR | Status: AC
  Administered 2017-08-18 (×2): 40 mg via INTRAVENOUS
  Filled 2017-08-18 (×2): qty 4

## 2017-08-18 MED ORDER — FUROSEMIDE 40 MG PO TABS
40.0000 mg | ORAL_TABLET | Freq: Every day | ORAL | Status: DC
  Administered 2017-08-19 – 2017-08-20 (×2): 40 mg via ORAL
  Filled 2017-08-18 (×2): qty 1

## 2017-08-18 NOTE — Consult Note (Signed)
Referring Physician:  DEUNDRA FURBER is an 71 y.o. female.                       Chief Complaint: Bleeding with coumadin and Brilinta use.  HPI: 71 year old female with multiple myeloma had coronary stents in LAD and LCX about 8 months ago. 2 months later she had left lower leg DVT. She suffered acute GI bleed with supra-therapeutic INR of 8. She is currently stable post 2 units of blood transfusion.   Past Medical History:  Diagnosis Date  . ACS (acute coronary syndrome) ([place]) 06/2017  . Anemia    "off and on all my life" (12/22/2016)  . Anxiety   . Bilateral renal artery stenosis ([place]) 1999   s/p stenting  . Chronic kidney disease (CKD), stage III (moderate) ([place])    Archie Endo 12/22/2016  . Coronary artery disease 1999  . Depression   . Diverticulosis   . Duodenitis   . DVT (deep venous thrombosis) ([facility])    on Coumadin  . Dyspnea   . Gastric erosions   . Gastric ulcer   . GERD (gastroesophageal reflux disease)   . Heart murmur   . History of blood transfusion ~ 03/2016   "low HgB; practically nonexistent"  . Hyperlipidemia   . Hypertension   . MI (myocardial infarction) ([place]) 1999  . Multiple myeloma ([place]) dx'd ~ 04/2016  . PAT (paroxysmal atrial tachycardia) ([place]) 12/22/2016  . Retinal hemorrhage, right eye   . Schatzki's ring   . Tachy-brady syndrome ([place])    Archie Endo 12/22/2016  . Tubular adenoma of colon       Past Surgical History:  Procedure Laterality Date  . APPENDECTOMY    . BACK SURGERY    . CATARACT EXTRACTION W/ INTRAOCULAR LENS IMPLANT Right 2014  . CORONARY ANGIOGRAPHY N/A 01/23/2017   Procedure: Coronary Angiography;  Surgeon: [name], MD;  Location: [place] [facility];  Service: Cardiovascular;  Laterality: N/A;  . CORONARY ANGIOPLASTY WITH STENT PLACEMENT  1999  . CORONARY STENT INTERVENTION N/A 12/29/2016   Procedure: Coronary Stent Intervention;  Surgeon: Charolette Forward, MD;  Location: [place] [facility];  Service: Cardiovascular;  Laterality: N/A;  .  CORONARY STENT INTERVENTION N/A 02/24/2017   Procedure: Coronary Stent Intervention;  Surgeon: Charolette Forward, MD;  Location: [place] [facility];  Service: Cardiovascular;  Laterality: N/A;  . ESOPHAGOGASTRODUODENOSCOPY N/A 11/03/2015   Procedure: ESOPHAGOGASTRODUODENOSCOPY (EGD);  Surgeon: Carol Ada, MD;  Location[facility];  Service: Endoscopy;  Laterality: N/A;  . ESOPHAGOGASTRODUODENOSCOPY (EGD) WITH PROPOFOL N/A 11/18/2016   Procedure: ESOPHAGOGASTRODUODENOSCOPY (EGD) WITH PROPOFOL;  Surgeon: Ladene Artist, MD;  Location: [facility] ENDOSCOPY;  Service: Endoscopy;  Laterality: N/A;  . LEFT HEART CATH AND CORONARY ANGIOGRAPHY N/A 12/24/2016   Procedure: Left Heart Cath and Coronary Angiography;  Surgeon: [name], MD;  Location: [place] [facility];  Service: Cardiovascular;  Laterality: N/A;  . LEFT HEART CATH AND CORONARY ANGIOGRAPHY N/A 02/24/2017   Procedure: Left Heart Cath and Coronary Angiography;  Surgeon: Charolette Forward, MD;  Location: [place] [facility];  Service: Cardiovascular;  Laterality: N/A;  . LEFT HEART CATH AND CORONARY ANGIOGRAPHY N/A 03/05/2017   Procedure: Left Heart Cath and Coronary Angiography;  Surgeon: [name], MD;  Location: [place] [facility];  Service: Cardiovascular;  Laterality: N/A;  . LUMBAR [place]    . RENAL ARTERY STENT  1999   bil RAS so ? bil vs unilateral stents  .  RETINAL LASER PROCEDURE Right 2012   "bleeding"  . TONSILLECTOMY    . TOTAL ABDOMINAL HYSTERECTOMY      Family History  Problem Relation Age of Onset  . Breast cancer Mother   . Breast cancer Sister   . Breast cancer Maternal Aunt   . Colon cancer Neg Hx    Social History:  reports that she quit smoking about 8 months ago. Her smoking use included Cigarettes. She has a 25.00 pack-year smoking history. She has never used smokeless tobacco. She reports that she does not drink alcohol or use drugs.  Allergies:  Allergies  Allergen Reactions  . Sulfa Antibiotics Rash     Medications Prior to Admission  Medication Sig Dispense Refill  . acetaminophen (TYLENOL) 500 MG tablet Take 1,000 mg by mouth every 6 (six) hours as needed for mild pain or headache.    Marland Kitchen acyclovir (ZOVIRAX) 400 MG tablet TAKE 1 TABLET (400 MG TOTAL) BY MOUTH 2 (TWO) TIMES DAILY. 60 tablet 2  . amiodarone (PACERONE) 200 MG tablet Take 0.5 tablets (100 mg total) by mouth daily.    . Ascorbic Acid (VITAMIN C) 1000 MG tablet Take 1,000 mg by mouth daily.     Marland Kitchen atorvastatin (LIPITOR) 80 MG tablet Take 1 tablet (80 mg total) by mouth daily at 6 PM. 30 tablet 3  . calcium carbonate (OS-CAL - DOSED IN MG OF ELEMENTAL CALCIUM) 1250 (500 Ca) MG tablet Take 1 tablet by mouth daily with breakfast.    . Cholecalciferol (VITAMIN D PO) Take 1 capsule by mouth daily.    . ferrous sulfate 325 (65 FE) MG tablet Take 1 tablet (325 mg total) by mouth daily with breakfast. 30 tablet 3  . furosemide (LASIX) 40 MG tablet Take 1 tablet (40 mg total) by mouth every Monday, Wednesday, and Friday. (Patient taking differently: Take 40 mg by mouth as needed. For edema) 30 tablet 1  . nitroGLYCERIN (NITROSTAT) 0.4 MG SL tablet Place 0.4 mg under the tongue every 5 (five) minutes as needed for chest pain.  1  . pantoprazole (PROTONIX) 40 MG tablet Take 1 tablet (40 mg total) by mouth daily. 30 tablet 2  . potassium chloride (K-DUR,KLOR-CON) 10 MEQ tablet Take 1 tablet (10 mEq total) by mouth 2 (two) times daily. 60 tablet 1  . pyridostigmine (MESTINON) 60 MG tablet Take 0.5 tablets (30 mg total) by mouth 2 (two) times daily. 30 tablet 3  . ticagrelor (BRILINTA) 90 MG TABS tablet Take 1 tablet (90 mg total) by mouth 2 (two) times daily. 60 tablet 3  . warfarin (COUMADIN) 5 MG tablet Take 1 tablet (5 mg total) by mouth daily at 6 PM.      Results for orders placed or performed during the hospital encounter of 08/16/17 (from the past 48 hour(s))  Comprehensive metabolic panel     Status: Abnormal   Collection Time:  08/16/17  4:56 PM  Result Value Ref Range   Sodium 142 135 - 145 mmol/L   Potassium 5.0 3.5 - 5.1 mmol/L   Chloride 110 101 - 111 mmol/L   CO2 23 22 - 32 mmol/L   Glucose, Bld 110 (H) 65 - 99 mg/dL   BUN 22 (H) 6 - 20 mg/dL   Creatinine, Ser 1.17 (H) 0.44 - 1.00 mg/dL   Calcium 8.8 (L) 8.9 - 10.3 mg/dL   Total Protein 7.1 6.5 - 8.1 g/dL   Albumin 3.4 (L) 3.5 - 5.0 g/dL   AST 22 15 -  41 U/L   ALT 10 (L) 14 - 54 U/L   Alkaline Phosphatase 71 38 - 126 U/L   Total Bilirubin 0.9 0.3 - 1.2 mg/dL   GFR calc non Af Amer 46 (L) >60 mL/min   GFR calc Af Amer 53 (L) >60 mL/min    Comment: (NOTE) The eGFR has been calculated using the CKD EPI equation. This calculation has not been validated in all clinical situations. eGFR's persistently <60 mL/min signify possible Chronic Kidney Disease.    Anion gap 9 5 - 15  CBC with Differential     Status: Abnormal   Collection Time: 08/16/17  4:56 PM  Result Value Ref Range   WBC 11.1 (H) 4.0 - 10.5 K/uL   RBC 2.88 (L) 3.87 - 5.11 MIL/uL   Hemoglobin 8.3 (L) 12.0 - 15.0 g/dL   HCT 26.0 (L) 36.0 - 46.0 %   MCV 90.3 78.0 - 100.0 fL   MCH 28.8 26.0 - 34.0 pg   MCHC 31.9 30.0 - 36.0 g/dL   RDW 16.1 (H) 11.5 - 15.5 %   Platelets 274 150 - 400 K/uL   Neutrophils Relative % 73 %   Neutro Abs 8.1 (H) 1.7 - 7.7 K/uL   Lymphocytes Relative 11 %   Lymphs Abs 1.2 0.7 - 4.0 K/uL   Monocytes Relative 16 %   Monocytes Absolute 1.7 (H) 0.1 - 1.0 K/uL   Eosinophils Relative 0 %   Eosinophils Absolute 0.0 0.0 - 0.7 K/uL   Basophils Relative 0 %   Basophils Absolute 0.0 0.0 - 0.1 K/uL  Urinalysis, Routine w reflex microscopic     Status: Abnormal   Collection Time: 08/16/17  4:56 PM  Result Value Ref Range   Color, Urine YELLOW YELLOW   APPearance CLEAR CLEAR   Specific Gravity, Urine 1.018 1.005 - 1.030   pH 5.0 5.0 - 8.0   Glucose, UA NEGATIVE NEGATIVE mg/dL   Hgb urine dipstick MODERATE (A) NEGATIVE   Bilirubin Urine NEGATIVE NEGATIVE   Ketones,  ur NEGATIVE NEGATIVE mg/dL   Protein, ur NEGATIVE NEGATIVE mg/dL   Nitrite NEGATIVE NEGATIVE   Leukocytes, UA NEGATIVE NEGATIVE   RBC / HPF 0-5 0 - 5 RBC/hpf   WBC, UA 0-5 0 - 5 WBC/hpf   Bacteria, UA RARE (A) NONE SEEN   Squamous Epithelial / LPF 0-5 (A) NONE SEEN  Type and screen [place]     Status: None (Preliminary result)   Collection Time: 08/16/17  4:57 PM  Result Value Ref Range   ABO/RH(D) O POS    Antibody Screen POS    Sample Expiration 08/19/2017    Antibody Identification ANTI E    PT AG Type NEGATIVE FOR E ANTIGEN    Unit Number [id number]    Blood Component Type RED CELLS,LR    Unit division 00    Status of Unit ISSUED,FINAL    Transfusion Status OK TO TRANSFUSE    Crossmatch Result COMPATIBLE    Donor AG Type NEGATIVE FOR E ANTIGEN    Unit Number [id number]    Blood Component Type RED CELLS,LR    Unit division 00    Status of Unit ISSUED    Transfusion Status OK TO TRANSFUSE    Crossmatch Result COMPATIBLE    Donor AG Type NEGATIVE FOR E ANTIGEN   CG4 I-STAT (Lactic acid)     Status: None   Collection Time: 08/16/17  5:12 PM  Result Value Ref Range   Lactic  Acid, Venous 0.96 0.5 - 1.9 mmol/L  Occult blood, poc device     Status: Abnormal   Collection Time: 08/16/17  5:40 PM  Result Value Ref Range   Fecal Occult Bld POSITIVE (A) NEGATIVE  Lactic acid, plasma     Status: None   Collection Time: 08/17/17 12:44 AM  Result Value Ref Range   Lactic Acid, Venous 1.7 0.5 - 1.9 mmol/L  Procalcitonin     Status: None   Collection Time: 08/17/17 12:44 AM  Result Value Ref Range   Procalcitonin <0.10 ng/mL    Comment:        Interpretation: PCT (Procalcitonin) <= 0.5 ng/mL: Systemic infection (sepsis) is not likely. Local bacterial infection is possible. (NOTE)         ICU PCT Algorithm               Non ICU PCT Algorithm    ----------------------------     ------------------------------         PCT < 0.25 ng/mL                  PCT < 0.1 ng/mL     Stopping of antibiotics            Stopping of antibiotics       strongly encouraged.               strongly encouraged.    ----------------------------     ------------------------------       PCT level decrease by               PCT < 0.25 ng/mL       >= 80% from peak PCT       OR PCT 0.25 - 0.5 ng/mL          Stopping of antibiotics                                             encouraged.     Stopping of antibiotics           encouraged.    ----------------------------     ------------------------------       PCT level decrease by              PCT >= 0.25 ng/mL       < 80% from peak PCT        AND PCT >= 0.5 ng/mL            Continuin g antibiotics                                              encouraged.       Continuing antibiotics            encouraged.    ----------------------------     ------------------------------     PCT level increase compared          PCT > 0.5 ng/mL         with peak PCT AND          PCT >= 0.5 ng/mL             Escalation of antibiotics  strongly encouraged.      Escalation of antibiotics        strongly encouraged.   CBC     Status: Abnormal   Collection Time: 08/17/17 12:44 AM  Result Value Ref Range   WBC 12.3 (H) 4.0 - 10.5 K/uL   RBC 2.97 (L) 3.87 - 5.11 MIL/uL   Hemoglobin 8.6 (L) 12.0 - 15.0 g/dL   HCT 26.7 (L) 36.0 - 46.0 %   MCV 89.9 78.0 - 100.0 fL   MCH 29.0 26.0 - 34.0 pg   MCHC 32.2 30.0 - 36.0 g/dL   RDW 16.0 (H) 11.5 - 15.5 %   Platelets 274 150 - 400 K/uL  Protime-INR     Status: Abnormal   Collection Time: 08/17/17  5:50 AM  Result Value Ref Range   Prothrombin Time 66.9 (H) 11.4 - 15.2 seconds   INR 8.05 (HH)     Comment: REPEATED TO VERIFY CRITICAL RESULT CALLED TO, READ BACK BY AND VERIFIED WITH: A.LEMONS RN [phone] [id number] A.QUIZON   Lactic acid, plasma     Status: None   Collection Time: 08/17/17  5:50 AM  Result Value Ref Range   Lactic Acid, Venous 1.0 0.5 -  1.9 mmol/L  Basic metabolic panel     Status: Abnormal   Collection Time: 08/17/17  5:50 AM  Result Value Ref Range   Sodium 140 135 - 145 mmol/L   Potassium 4.1 3.5 - 5.1 mmol/L    Comment: DELTA CHECK NOTED   Chloride 111 101 - 111 mmol/L   CO2 19 (L) 22 - 32 mmol/L   Glucose, Bld 108 (H) 65 - 99 mg/dL   BUN 19 6 - 20 mg/dL   Creatinine, Ser 0.99 0.44 - 1.00 mg/dL   Calcium 8.4 (L) 8.9 - 10.3 mg/dL   GFR calc non Af Amer 56 (L) >60 mL/min   GFR calc Af Amer >60 >60 mL/min    Comment: (NOTE) The eGFR has been calculated using the CKD EPI equation. This calculation has not been validated in all clinical situations. eGFR's persistently <60 mL/min signify possible Chronic Kidney Disease.    Anion gap 10 5 - 15  CBC     Status: Abnormal   Collection Time: 08/17/17  5:50 AM  Result Value Ref Range   WBC 9.0 4.0 - 10.5 K/uL   RBC 3.52 (L) 3.87 - 5.11 MIL/uL   Hemoglobin 9.4 (L) 12.0 - 15.0 g/dL   HCT 30.9 (L) 36.0 - 46.0 %   MCV 87.8 78.0 - 100.0 fL   MCH 26.7 26.0 - 34.0 pg   MCHC 30.4 30.0 - 36.0 g/dL   RDW 15.9 (H) 11.5 - 15.5 %   Platelets 223 150 - 400 K/uL  CBC     Status: Abnormal   Collection Time: 08/17/17  2:12 PM  Result Value Ref Range   WBC 11.0 (H) 4.0 - 10.5 K/uL   RBC 2.51 (L) 3.87 - 5.11 MIL/uL   Hemoglobin 7.3 (L) 12.0 - 15.0 g/dL    Comment: DELTA CHECK NOTED   HCT 22.1 (L) 36.0 - 46.0 %   MCV 88.0 78.0 - 100.0 fL   MCH 29.1 26.0 - 34.0 pg   MCHC 33.0 30.0 - 36.0 g/dL   RDW 16.2 (H) 11.5 - 15.5 %   Platelets 262 150 - 400 K/uL  Prepare RBC     Status: None   Collection Time: 08/17/17  3:00 PM  Result Value Ref Range   Order  Confirmation ORDER PROCESSED BY BLOOD BANK   Prepare fresh frozen plasma     Status: None (Preliminary result)   Collection Time: 08/17/17  3:30 PM  Result Value Ref Range   Unit Number [id number]    Blood Component Type THAWED PLASMA    Unit division 00    Status of Unit ISSUED,FINAL    Transfusion Status OK TO TRANSFUSE     Unit Number [id number]    Blood Component Type THAWED PLASMA    Unit division 00    Status of Unit ALLOCATED    Transfusion Status OK TO TRANSFUSE   MRSA PCR Screening     Status: None   Collection Time: 08/17/17  4:08 PM  Result Value Ref Range   MRSA by PCR NEGATIVE NEGATIVE    Comment:        The GeneXpert MRSA Assay (FDA approved for NASAL specimens only), is one component of a comprehensive MRSA colonization surveillance program. It is not intended to diagnose MRSA infection nor to guide or monitor treatment for MRSA infections.   Lactic acid, plasma     Status: None   Collection Time: 08/17/17  9:00 PM  Result Value Ref Range   Lactic Acid, Venous 0.7 0.5 - 1.9 mmol/L  Basic metabolic panel     Status: Abnormal   Collection Time: 08/17/17  9:00 PM  Result Value Ref Range   Sodium 139 135 - 145 mmol/L   Potassium 3.7 3.5 - 5.1 mmol/L   Chloride 112 (H) 101 - 111 mmol/L   CO2 18 (L) 22 - 32 mmol/L   Glucose, Bld 96 65 - 99 mg/dL   BUN 18 6 - 20 mg/dL   Creatinine, Ser 0.97 0.44 - 1.00 mg/dL   Calcium 7.4 (L) 8.9 - 10.3 mg/dL   GFR calc non Af Amer 58 (L) >60 mL/min   GFR calc Af Amer >60 >60 mL/min    Comment: (NOTE) The eGFR has been calculated using the CKD EPI equation. This calculation has not been validated in all clinical situations. eGFR's persistently <60 mL/min signify possible Chronic Kidney Disease.    Anion gap 9 5 - 15  CBC     Status: Abnormal   Collection Time: 08/18/17  4:50 AM  Result Value Ref Range   WBC 11.1 (H) 4.0 - 10.5 K/uL   RBC 2.98 (L) 3.87 - 5.11 MIL/uL   Hemoglobin 8.6 (L) 12.0 - 15.0 g/dL   HCT 25.7 (L) 36.0 - 46.0 %   MCV 86.2 78.0 - 100.0 fL   MCH 28.9 26.0 - 34.0 pg   MCHC 33.5 30.0 - 36.0 g/dL   RDW 15.2 11.5 - 15.5 %   Platelets 231 150 - 400 K/uL  Creatinine, serum     Status: Abnormal   Collection Time: 08/18/17  4:50 AM  Result Value Ref Range   Creatinine, Ser 1.03 (H) 0.44 - 1.00 mg/dL   GFR calc non Af Amer  54 (L) >60 mL/min   GFR calc Af Amer >60 >60 mL/min    Comment: (NOTE) The eGFR has been calculated using the CKD EPI equation. This calculation has not been validated in all clinical situations. eGFR's persistently <60 mL/min signify possible Chronic Kidney Disease.   Protime-INR     Status: Abnormal   Collection Time: 08/18/17  4:50 AM  Result Value Ref Range   Prothrombin Time 21.1 (H) 11.4 - 15.2 seconds   INR 1.84    Dg Chest 2 View  Result  Date: 08/16/2017 CLINICAL DATA:  Sepsis.  Weakness.  Bloody stool. EXAM: CHEST  2 VIEW COMPARISON:  06/25/2017 FINDINGS: Stable mild cardiomegaly.  Aortic atherosclerosis. Mild infiltrate is seen in the right lower lobe, suspicious for pneumonia. Left lung is clear. No evidence of pneumothorax or pleural effusion. IMPRESSION: Mild right lower lobe infiltrate, suspicious for pneumonia. Recommend clinical correlation; consider followup PA and lateral chest X-ray following trial of antibiotic therapy to ensure resolution and exclude underlying malignancy. Mild cardiomegaly. Electronically Signed   By: Earle Gell M.D.   On: 08/16/2017 16:32   Ct Abdomen Pelvis W Contrast  Result Date: 08/16/2017 CLINICAL DATA:  Headache fever and bloody stools EXAM: CT ABDOMEN AND PELVIS WITH CONTRAST TECHNIQUE: Multidetector CT imaging of the abdomen and pelvis was performed using the standard protocol following bolus administration of intravenous contrast. CONTRAST:  100 mL Isovue-300 intravenous COMPARISON:  12/08/2016, 11/19/2016, 11/18/2016, 10/27/2016 FINDINGS: Lower chest: Ground-glass density in the right lower lobe consistent with pneumonia. No pleural effusion. Coronary calcification. Borderline cardiomegaly. Hepatobiliary: No calcified gallstones. No focal hepatic abnormality. No biliary dilatation Pancreas: Unremarkable. No pancreatic ductal dilatation or surrounding inflammatory changes. Spleen: Normal in size without focal abnormality. Adrenals/Urinary Tract:  Adrenal glands are within normal limits. Kidneys show no hydronephrosis. Subcentimeter hypodense lesions, too small to further characterize. Bilateral renal cysts. Intermediate density right lower pole lesion demonstrated to represent a cyst on prior MRI, no significant change in size. The bladder is unremarkable Stomach/Bowel: Stomach is nonenlarged. No dilated small bowel. No colon wall thickening. Sigmoid colon diverticula without acute inflammation Vascular/Lymphatic: Extensive atherosclerotic calcification. No aneurysmal dilatation. Bilateral renal artery stents. No significantly enlarged abdominal or pelvic lymph nodes Reproductive: Status post hysterectomy. No adnexal masses. Other: Negative for free air or free fluid Musculoskeletal: Possible old sacrococcygeal fracture. Stable retrolisthesis L1 on L2 and T12 on L1. Lucent lesion in T12 now contains central calcification. IMPRESSION: 1. Ground-glass opacity in the right lower lobe is suspicious for pneumonia. Imaging follow-up to resolution is recommended 2. Negative for bowel obstruction, bowel wall thickening or colitis. Scattered sigmoid colon diverticula without acute inflammation 3. Stable bilateral renal lesions 4. Stable size of 12 mm lucent lesion within the right aspect of T12 vertebral body, now with central calcifications. This was thought to possibly relate to history of myeloma. Electronically Signed   By: Donavan Foil M.D.   On: 08/16/2017 20:14    Review Of Systems Constitutional: No fever, chills, weight loss or gain. Eyes: No vision change, wears glasses. No discharge or pain. Ears: No hearing loss, No tinnitus. Respiratory: No asthma, COPD, pneumonias. Positive shortness of breath. No hemoptysis. Cardiovascular: Positive chest pain, palpitation, leg edema. Gastrointestinal: No nausea, vomiting, diarrhea, constipation. Positive GI bleed. No hepatitis. Genitourinary: No dysuria, hematuria, kidney stone. No  incontinance. Neurological: Positive headache, no stroke, seizures.  Psychiatry: No psych facility admission for anxiety, depression, suicide. No detox. Skin: No rash. Musculoskeletal: Positive joint pain, fibromyalgia. No neck pain, back pain. Lymphadenopathy: No lymphadenopathy. Hematology: Positive anemia or easy bruising.   Blood pressure 107/77, pulse 64, temperature 100.3 F (37.9 C), temperature source Oral, resp. rate (!) 29, height 5' 5.5" (1.664 m), weight 72 kg (158 lb 11.7 oz), SpO2 99 %. Body mass index is 26.01 kg/m. General appearance: alert, cooperative, appears stated age and no distress Head: Normocephalic, atraumatic. Eyes: Brown eyes, pale pink conjunctiva, corneas clear. PERRL, EOM's intact. Neck: No adenopathy, no carotid bruit, no JVD, supple, symmetrical, trachea midline and thyroid not enlarged. Resp: Clear to  auscultation bilaterally. Cardio: Regular rate and rhythm, S1, S2 normal, II/VI systolic murmur, no click, rub or gallop GI: Soft, non-tender; bowel sounds normal; no organomegaly. Extremities: No edema, cyanosis or clubbing. Skin: Warm and dry.  Neurologic: Alert and oriented X 3, normal strength. Normal coordination.  Assessment/Plan Acute lower GI bleed Anemia of blood loss S/P DVT S/P stents in LAD and LCX Multiple myeloma H/O small apical thrombus in LV  She will benefit from aspirin and Brilinta use for another 6 months. Start aspirin first and add Brilinta 2 days later.. Will check vascular ultrasound of lower leg and r/o DVT. Will also get echocardiogram for LV function and f/u on apical clot.  Birdie Riddle, MD  08/18/2017, 8:08 AM

## 2017-08-18 NOTE — Care Management Note (Signed)
Case Management Note  Patient Details  Name: Abigail [name] MRN: [id number] Date of Birth: October 23, 1945  Subjective/Objective:                  Gi bleed requiring bld transfusions  Action/Plan: Date:  August 18, 2017 Chart reviewed for concurrent status and case management needs.  Will continue to follow patient progress.  Discharge Planning: following for needs  Expected discharge date: [id number]  Velva Harman, BSN, [place], [place]   Expected Discharge Date:  08/18/17               Expected Discharge Plan:  Home/Self Care  In-House Referral:     Discharge planning Services  CM Consult  Post Acute Care Choice:    Choice offered to:     DME Arranged:    DME Agency:     HH Arranged:    HH Agency:     Status of Service:  In process, will continue to follow  If discussed at Long Length of Stay Meetings, dates discussed:    Additional Comments:  Leeroy Cha, RN 08/18/2017, 8:33 AM

## 2017-08-18 NOTE — Progress Notes (Signed)
Bilateral lower extremity venous duplex has been completed. Preliminary results can be found in chart review -> CV Proc  08/18/17 1:46 PM Carlos Levering RVT

## 2017-08-18 NOTE — Evaluation (Signed)
Physical Therapy Evaluation Patient Details Name: Abigail [name] MRN: [id number] DOB: [birth year] Today's Date: 08/18/2017   History of Present Illness  Abigail [name] is a 71 y.o. female with medical history significant of multiple myeloma; DVT on COumadin; CAD; HTN; HLD; CKD stage 3 with h/o B RAS s/Abigail stenting; and chronic anemia presenting because she started passing a lot of blood in her stool. Hgb 8.7, INR >8. Doppler pending for LE.  Clinical Impression  The patient is mobilizing well,  Fatigued after ambulating. Plans to STay with sister if needed after Dc.  Pt admitted with above diagnosis. Pt currently with functional limitations due to the deficits listed below (see PT Problem List).  Pt will benefit from skilled PT to increase their independence and safety with mobility to allow discharge to the venue listed below.   BP 163/72. HR 68.  Follow Up Recommendations No PT follow up    Equipment Recommendations  None recommended by PT    Recommendations for Other Services       Precautions / Restrictions Precautions Precaution Comments: monitor BP Restrictions Weight Bearing Restrictions: No      Mobility  Bed Mobility Overal bed mobility: Independent                Transfers Overall transfer level: Needs assistance Equipment used: Rolling walker (2 wheeled);None Transfers: Sit to/from Stand Sit to Stand: Supervision            Ambulation/Gait Ambulation/Gait assistance: Min assist;Min guard Ambulation Distance (Feet): 70 Feet Assistive device: Rolling walker (2 wheeled);1 person hand held assist Gait Pattern/deviations: Step-to pattern;Step-through pattern Gait velocity: decr   General Gait Details: HHA x 15', slow speed  Stairs            Wheelchair Mobility    Modified Rankin (Stroke Patients Only)       Balance Overall balance assessment: Needs assistance   Sitting balance-Leahy Scale: Normal     Standing balance support: During functional  activity;No upper extremity supported   Standing balance comment: stands and performs self pericare without balance loss                             Pertinent Vitals/Pain Pain Assessment: No/denies pain    Home Living Family/patient expects to be discharged to:: Private residence Living Arrangements: Alone;Other relatives Available Help at Discharge: Family;Available PRN/intermittently Type of Home: Apartment Home Access: Stairs to enter Entrance Stairs-Rails: Left Entrance Stairs-Number of Steps: 3 Home Layout: One level Home Equipment: Walker - 2 wheels;Grab bars - tub/shower Additional Comments: plans to stay with sister if needed    Prior Function Level of Independence: Independent         Comments: was independent, was able to get out of the house but does not drive     Hand Dominance        Extremity/Trunk Assessment   Upper Extremity Assessment Upper Extremity Assessment: Generalized weakness    Lower Extremity Assessment Lower Extremity Assessment: Generalized weakness    Cervical / Trunk Assessment Cervical / Trunk Assessment: Normal  Communication      Cognition Arousal/Alertness: Awake/alert Behavior During Therapy: WFL for tasks assessed/performed Overall Cognitive Status: Within Functional Limits for tasks assessed                                        General Comments  Exercises     Assessment/Plan    PT Assessment Patient needs continued PT services  PT Problem List Decreased activity tolerance;Decreased mobility       PT Treatment Interventions DME instruction;Gait training;Functional mobility training;Therapeutic activities;Patient/family education    PT Goals (Current goals can be found in the Care Plan section)  Acute Rehab PT Goals Patient Stated Goal: to return home PT Goal Formulation: With patient Time For Goal Achievement: 09/01/17 Potential to Achieve Goals: Good    Frequency Min  3X/week   Barriers to discharge        Co-evaluation               AM-PAC PT "6 Clicks" Daily Activity  Outcome Measure Difficulty turning over in bed (including adjusting bedclothes, sheets and blankets)?: None Difficulty moving from lying on back to sitting on the side of the bed? : None Difficulty sitting down on and standing up from a chair with arms (e.g., wheelchair, bedside commode, etc,.)?: A Little Help needed moving to and from a bed to chair (including a wheelchair)?: A Little Help needed walking in hospital room?: A Little Help needed climbing 3-5 steps with a railing? : Total 6 Click Score: 18    End of Session   Activity Tolerance: Patient tolerated treatment well Patient left: in chair;with call bell/phone within reach;with nursing/sitter in room Nurse Communication: Mobility status PT Visit Diagnosis: Difficulty in walking, not elsewhere classified (R26.2)    Time: 6629-4765 PT Time Calculation (min) (ACUTE ONLY): 25 min   Charges:   PT Evaluation $PT Eval Low Complexity: 1 Low PT Treatments $Gait Training: 8-22 mins   PT G CodesTresa Endo PT [phone]   Claretha Cooper 08/18/2017, 10:23 AM

## 2017-08-18 NOTE — Progress Notes (Signed)
[facility] PROGRESS NOTE    Progress Note  Abigail [name]  VOH:[id number] DOB: 12/28/1945 DOA: 08/16/2017 PCP: Nolene Ebbs, MD     Brief Narrative:   Abigail [name] is an 71 y.o. female past medical history significant for multiple myeloma, DVT on Coumadin, chronic kidney disease stage III, coronary artery sees status post stenting on Coumadin and Brillinta, with systolic heart failure with ejection fraction 40%, chronic anemia presents after passing bright red blood per rectum that started Friday in the afternoon she relates lightheadedness upon standing. INR was checked in the ED that was 8, her previous hemoglobin ranges around 10, on admission 9.4, she was also found to have fever and 11. White count on admission, she was started empirically on antibiotics.  Assessment/Plan:   GI bleed: She status post 2 units of packed red blood cells, her hemoglobin this morning is 8.6 she had no further bloody stools. She is also status post 2 units of fresh frozen plasma and vitamin K and her INR today is 1.8. - Last colonoscopy 2016 showed 2 polyps and diverticulosis, this probably a diverticular bleed in the setting of Brillinta and supratherapeutic INR. - Allow diet. Consult cards for recommendation on anticoagulation.  Sepsis due to pneumonia: Chest x-ray and CT show possible right lower pneumonia Culture data is negative till date. Cont IV vancomycin and cefepime. Afebrile WBC improving.  Multiple myeloma (HCC) In remission, continue acyclovir.  Essential hypertension Hold antihypertensive medication.  Chronic diastolic HF: +JVD on physical exam, start IV lasix.   DVT prophylaxis: scd's Family Communication: none Disposition Plan/Barrier to D/C: transfer to floor. Code Status:     Code Status Orders        Start     Ordered   08/16/17 2335  Full code  Continuous     08/16/17 2334    Code Status History    Date Active Date Inactive Code Status Order ID Comments User  Context   06/23/2017 12:13 PM 06/25/2017  3:16 PM Full Code [id number]  [name], MD ED   04/02/2017  9:56 AM 04/08/2017  7:01 PM Full Code [id number]  [name], MD ED   03/04/2017  8:08 AM 03/12/2017  5:28 PM Full Code [id number]  [name], MD ED   02/24/2017 11:34 PM 03/01/2017  7:31 PM Full Code [id number]  Charolette Forward, MD Inpatient   02/24/2017 11:33 PM 02/24/2017 11:34 PM Full Code [id number]  Doylene Canning, MD Inpatient   01/21/2017  5:38 PM 01/25/2017  6:34 PM Full Code [id number]  [name], MD Inpatient   12/29/2016  2:15 PM 01/02/2017 10:39 PM Full Code [id number]  Charolette Forward, MD Inpatient   12/22/2016  7:52 PM 12/29/2016  2:15 PM Full Code [id number]  [name], MD Inpatient   12/06/2016  7:56 PM 12/09/2016  8:08 PM Full Code [id number]  Rosita Fire, MD Inpatient   11/23/2016  4:44 PM 11/27/2016  6:19 PM Full Code [id number]  Elgergawy, Silver Huguenin, MD Inpatient   11/17/2016  2:04 PM 11/21/2016  8:08 PM Full Code [id number]  Patrecia Pour, MD Inpatient   06/12/2016  3:56 PM 06/19/2016 10:03 PM Full Code [id number]  [name], MD Inpatient   01/11/2016  4:08 PM 01/14/2016  2:20 PM Full Code [id number]  [name], MD Inpatient   11/01/2015  5:35 PM 11/04/2015  5:38 PM Full Code [id number]  [name], MD Inpatient   12/31/2011  4:05 AM 01/02/2012  4:40 PM Full Code [id number]  Griffin Dakin  Jeani Hawking, RN Inpatient        IV Access:    Peripheral IV   Procedures and diagnostic studies:   Dg Chest 2 View  Result Date: 08/16/2017 CLINICAL DATA:  Sepsis.  Weakness.  Bloody stool. EXAM: CHEST  2 VIEW COMPARISON:  06/25/2017 FINDINGS: Stable mild cardiomegaly.  Aortic atherosclerosis. Mild infiltrate is seen in the right lower lobe, suspicious for pneumonia. Left lung is clear. No evidence of pneumothorax or pleural effusion. IMPRESSION: Mild right lower lobe infiltrate, suspicious for pneumonia. Recommend clinical correlation; consider followup PA and lateral chest X-ray following  trial of antibiotic therapy to ensure resolution and exclude underlying malignancy. Mild cardiomegaly. Electronically Signed   By: Earle Gell M.D.   On: 08/16/2017 16:32   Ct Abdomen Pelvis W Contrast  Result Date: 08/16/2017 CLINICAL DATA:  Headache fever and bloody stools EXAM: CT ABDOMEN AND PELVIS WITH CONTRAST TECHNIQUE: Multidetector CT imaging of the abdomen and pelvis was performed using the standard protocol following bolus administration of intravenous contrast. CONTRAST:  100 mL Isovue-300 intravenous COMPARISON:  12/08/2016, 11/19/2016, 11/18/2016, 10/27/2016 FINDINGS: Lower chest: Ground-glass density in the right lower lobe consistent with pneumonia. No pleural effusion. Coronary calcification. Borderline cardiomegaly. Hepatobiliary: No calcified gallstones. No focal hepatic abnormality. No biliary dilatation Pancreas: Unremarkable. No pancreatic ductal dilatation or surrounding inflammatory changes. Spleen: Normal in size without focal abnormality. Adrenals/Urinary Tract: Adrenal glands are within normal limits. Kidneys show no hydronephrosis. Subcentimeter hypodense lesions, too small to further characterize. Bilateral renal cysts. Intermediate density right lower pole lesion demonstrated to represent a cyst on prior MRI, no significant change in size. The bladder is unremarkable Stomach/Bowel: Stomach is nonenlarged. No dilated small bowel. No colon wall thickening. Sigmoid colon diverticula without acute inflammation Vascular/Lymphatic: Extensive atherosclerotic calcification. No aneurysmal dilatation. Bilateral renal artery stents. No significantly enlarged abdominal or pelvic lymph nodes Reproductive: Status post hysterectomy. No adnexal masses. Other: Negative for free air or free fluid Musculoskeletal: Possible old sacrococcygeal fracture. Stable retrolisthesis L1 on L2 and T12 on L1. Lucent lesion in T12 now contains central calcification. IMPRESSION: 1. Ground-glass opacity in the right  lower lobe is suspicious for pneumonia. Imaging follow-up to resolution is recommended 2. Negative for bowel obstruction, bowel wall thickening or colitis. Scattered sigmoid colon diverticula without acute inflammation 3. Stable bilateral renal lesions 4. Stable size of 12 mm lucent lesion within the right aspect of T12 vertebral body, now with central calcifications. This was thought to possibly relate to history of myeloma. Electronically Signed   By: Donavan Foil M.D.   On: 08/16/2017 20:14     Medical Consultants:    None.  Anti-Infectives:   IV vancomycin and cefepime.  Subjective:    Shirlee Limerick she relates she feels better.  Objective:    Vitals:   08/18/17 0050 08/18/17 0200 08/18/17 0230 08/18/17 0349  BP: (!) 124/56 123/61 107/77   Pulse: (!) 58 63 64   Resp: (!) 26 17 (!) 29   Temp: (!) 100.4 F (38 C)  99.9 F (37.7 C) 100.3 F (37.9 C)  TempSrc: Oral  Oral Oral  SpO2:  97% 99%   Weight:      Height:        Intake/Output Summary (Last 24 hours) at 08/18/17 0721 Last data filed at 08/18/17 0230  Gross per 24 hour  Intake          1547.08 ml  Output  400 ml  Net          1147.08 ml   Filed Weights   08/17/17 0219 08/17/17 1525  Weight: 72 kg (158 lb 11.7 oz) 72 kg (158 lb 11.7 oz)    Exam: General exam: In no acute distress. Respiratory system: crackles on the right, good air  Movement  Cardiovascular system: S1 & S2 heard, RRR. + JVD, murmurs, rubs, gallops or clicks.  Gastrointestinal system: Abdomen is nondistended, soft and nontender.  Central nervous system: Alert and oriented. No focal neurological deficits. Extremities: No pedal edema. Skin: No rashes, lesions or ulcers Psychiatry: Judgement and insight appear normal. Mood & affect appropriate.    Data Reviewed:    Labs: Basic Metabolic Panel:  Recent Labs Lab 08/16/17 1656 08/17/17 0550 08/17/17 2100 08/18/17 0450  NA 142 140 139  --   K 5.0 4.1 3.7  --   CL 110  111 112*  --   CO2 23 19* 18*  --   GLUCOSE 110* 108* 96  --   BUN 22* 19 18  --   CREATININE 1.17* 0.99 0.97 1.03*  CALCIUM 8.8* 8.4* 7.4*  --    GFR Estimated Creatinine Clearance: 51.1 mL/min (A) (by C-G formula based on SCr of 1.03 mg/dL (H)). Liver Function Tests:  Recent Labs Lab 08/16/17 1656  AST 22  ALT 10*  ALKPHOS 71  BILITOT 0.9  PROT 7.1  ALBUMIN 3.4*   No results for input(s): LIPASE, AMYLASE in the last 168 hours. No results for input(s): AMMONIA in the last 168 hours. Coagulation profile  Recent Labs Lab 08/17/17 0550 08/18/17 0450  INR 8.05* 1.84    CBC:  Recent Labs Lab 08/16/17 1656 08/17/17 0044 08/17/17 0550 08/17/17 1412 08/18/17 0450  WBC 11.1* 12.3* 9.0 11.0* 11.1*  NEUTROABS 8.1*  --   --   --   --   HGB 8.3* 8.6* 9.4* 7.3* 8.6*  HCT 26.0* 26.7* 30.9* 22.1* 25.7*  MCV 90.3 89.9 87.8 88.0 86.2  PLT 274 274 223 262 231   Cardiac Enzymes: No results for input(s): CKTOTAL, CKMB, CKMBINDEX, TROPONINI in the last 168 hours. BNP (last 3 results) No results for input(s): PROBNP in the last 8760 hours. CBG: No results for input(s): GLUCAP in the last 168 hours. D-Dimer: No results for input(s): DDIMER in the last 72 hours. Hgb A1c: No results for input(s): HGBA1C in the last 72 hours. Lipid Profile: No results for input(s): CHOL, HDL, LDLCALC, TRIG, CHOLHDL, LDLDIRECT in the last 72 hours. Thyroid function studies: No results for input(s): TSH, T4TOTAL, T3FREE, THYROIDAB in the last 72 hours.  Invalid input(s): FREET3 Anemia work up: No results for input(s): VITAMINB12, FOLATE, FERRITIN, TIBC, IRON, RETICCTPCT in the last 72 hours. Sepsis Labs:  Recent Labs Lab 08/16/17 1712 08/17/17 0044 08/17/17 0550 08/17/17 1412 08/17/17 2100 08/18/17 0450  PROCALCITON  --  <0.10  --   --   --   --   WBC  --  12.3* 9.0 11.0*  --  11.1*  LATICACIDVEN 0.96 1.7 1.0  --  0.7  --    Microbiology Recent Results (from the past 240 hour(s))    MRSA PCR Screening     Status: None   Collection Time: 08/17/17  4:08 PM  Result Value Ref Range Status   MRSA by PCR NEGATIVE NEGATIVE Final    Comment:        The GeneXpert MRSA Assay (FDA approved for NASAL specimens only), is one component  of a comprehensive MRSA colonization surveillance program. It is not intended to diagnose MRSA infection nor to guide or monitor treatment for MRSA infections.      Medications:   . acyclovir  400 mg Oral BID  . amiodarone  100 mg Oral Daily  . atorvastatin  80 mg Oral q1800  . Chlorhexidine Gluconate Cloth  6 each Topical Q0600  . pantoprazole  40 mg Intravenous Q12H  . pyridostigmine  30 mg Oral BID  . vitamin A & D       Continuous Infusions: . sodium chloride    . sodium chloride 75 mL/hr at 08/18/17 0234  . ceFEPime (MAXIPIME) IV Stopped (08/18/17 0304)  . vancomycin        LOS: 2 days   Charlynne Cousins  [facility] Pager [phone]  *Please refer to [email or web address], password [email or web address] to get updated schedule on who will round on this patient, as hospitalists switch teams weekly. If 7PM-7AM, please contact night-coverage at [email or web address], password [email or web address] for any overnight needs.  08/18/2017, 7:21 AM

## 2017-08-19 ENCOUNTER — Other Ambulatory Visit: Payer: Self-pay

## 2017-08-19 ENCOUNTER — Inpatient Hospital Stay (HOSPITAL_COMMUNITY): Payer: Medicare HMO

## 2017-08-19 DIAGNOSIS — A419 Sepsis, unspecified organism: Principal | ICD-10-CM

## 2017-08-19 DIAGNOSIS — D62 Acute posthemorrhagic anemia: Secondary | ICD-10-CM

## 2017-08-19 DIAGNOSIS — J189 Pneumonia, unspecified organism: Secondary | ICD-10-CM

## 2017-08-19 DIAGNOSIS — K922 Gastrointestinal hemorrhage, unspecified: Secondary | ICD-10-CM

## 2017-08-19 LAB — TYPE AND SCREEN
ABO/RH(D): O POS
Antibody Screen: POSITIVE
DONOR AG TYPE: NEGATIVE
Donor AG Type: NEGATIVE
PT AG Type: NEGATIVE
UNIT DIVISION: 0
UNIT DIVISION: 0

## 2017-08-19 LAB — ECHOCARDIOGRAM COMPLETE
HEIGHTINCHES: 65.5 in
WEIGHTICAEL: 2539.7 [oz_av]

## 2017-08-19 LAB — PREPARE FRESH FROZEN PLASMA
UNIT DIVISION: 0
Unit division: 0

## 2017-08-19 LAB — BASIC METABOLIC PANEL
Anion gap: 11 (ref 5–15)
BUN: 16 mg/dL (ref 6–20)
CALCIUM: 8.2 mg/dL — AB (ref 8.9–10.3)
CO2: 24 mmol/L (ref 22–32)
CREATININE: 1.09 mg/dL — AB (ref 0.44–1.00)
Chloride: 107 mmol/L (ref 101–111)
GFR calc non Af Amer: 50 mL/min — ABNORMAL LOW (ref >60)
GFR, EST AFRICAN AMERICAN: 58 mL/min — AB (ref >60)
Glucose, Bld: 100 mg/dL — ABNORMAL HIGH (ref 65–99)
Potassium: 3.1 mmol/L — ABNORMAL LOW (ref 3.5–5.1)
SODIUM: 142 mmol/L (ref 135–145)

## 2017-08-19 LAB — BPAM FFP
BLOOD PRODUCT EXPIRATION DATE: 201811032359
BLOOD PRODUCT EXPIRATION DATE: 201811032359
ISSUE DATE / TIME: 201810291632
UNIT TYPE AND RH: 5100
Unit Type and Rh: 5100

## 2017-08-19 LAB — CREATININE, SERUM
Creatinine, Ser: 1.07 mg/dL — ABNORMAL HIGH (ref 0.44–1.00)
GFR, EST AFRICAN AMERICAN: 60 mL/min — AB (ref >60)
GFR, EST NON AFRICAN AMERICAN: 51 mL/min — AB (ref >60)

## 2017-08-19 LAB — CBC
HEMATOCRIT: 26.9 % — AB (ref 36.0–46.0)
Hemoglobin: 9 g/dL — ABNORMAL LOW (ref 12.0–15.0)
MCH: 28.5 pg (ref 26.0–34.0)
MCHC: 33.5 g/dL (ref 30.0–36.0)
MCV: 85.1 fL (ref 78.0–100.0)
Platelets: 296 10*3/uL (ref 150–400)
RBC: 3.16 MIL/uL — ABNORMAL LOW (ref 3.87–5.11)
RDW: 15.5 % (ref 11.5–15.5)
WBC: 8.1 10*3/uL (ref 4.0–10.5)

## 2017-08-19 LAB — BPAM RBC
Blood Product Expiration Date: 201811142359
Blood Product Expiration Date: 201811292359
ISSUE DATE / TIME: 201810292231
ISSUE DATE / TIME: 201810300026
Unit Type and Rh: 5100
Unit Type and Rh: 5100

## 2017-08-19 MED ORDER — POTASSIUM CHLORIDE CRYS ER 10 MEQ PO TBCR
10.0000 meq | EXTENDED_RELEASE_TABLET | Freq: Two times a day (BID) | ORAL | Status: DC
  Administered 2017-08-20 – 2017-08-21 (×3): 10 meq via ORAL
  Filled 2017-08-19 (×3): qty 1

## 2017-08-19 MED ORDER — POTASSIUM CHLORIDE CRYS ER 20 MEQ PO TBCR
40.0000 meq | EXTENDED_RELEASE_TABLET | Freq: Two times a day (BID) | ORAL | Status: AC
  Administered 2017-08-19 (×2): 40 meq via ORAL
  Filled 2017-08-19 (×2): qty 2

## 2017-08-19 MED ORDER — TICAGRELOR 90 MG PO TABS
90.0000 mg | ORAL_TABLET | Freq: Two times a day (BID) | ORAL | Status: DC
  Administered 2017-08-19 – 2017-08-21 (×4): 90 mg via ORAL
  Filled 2017-08-19 (×6): qty 1

## 2017-08-19 MED ORDER — FERROUS SULFATE 325 (65 FE) MG PO TABS
325.0000 mg | ORAL_TABLET | Freq: Every day | ORAL | Status: DC
  Administered 2017-08-19 – 2017-08-21 (×3): 325 mg via ORAL
  Filled 2017-08-19 (×3): qty 1

## 2017-08-19 NOTE — Progress Notes (Signed)
Physical Therapy Treatment Patient Details Name: Abigail [name] MRN: [id number] DOB: 07-12-1946 Today's Date: 08/19/2017    History of Present Illness Abigail [name] is a 71 y.o. female with medical history significant of multiple myeloma; DVT on COumadin; CAD; HTN; HLD; CKD stage 3 with h/o B RAS s/Abigail stenting; and chronic anemia presenting because she started passing a lot of blood in her stool. Hgb 8.7, INR >8.    PT Comments    Pt motivated and increased distance ambulated but ltd by c/o bilat ankle pain with WB  Follow Up Recommendations  No PT follow up     Equipment Recommendations  None recommended by PT    Recommendations for Other Services       Precautions / Restrictions Precautions Precautions: Fall Restrictions Weight Bearing Restrictions: No    Mobility  Bed Mobility Overal bed mobility: Modified Independent             General bed mobility comments: Pt unassisted supine<>sit but with increased time  Transfers Overall transfer level: Needs assistance Equipment used: Rolling walker (2 wheeled);None Transfers: Sit to/from Stand Sit to Stand: Supervision            Ambulation/Gait Ambulation/Gait assistance: Min assist;Min guard Ambulation Distance (Feet): 150 Feet Assistive device: Rolling walker (2 wheeled) Gait Pattern/deviations: Step-to pattern;Step-through pattern Gait velocity: decr Gait velocity interpretation: Below normal speed for age/gender General Gait Details: cues for posture and position from RW.  Multiple standing rest breaks to complete task   Stairs            Wheelchair Mobility    Modified Rankin (Stroke Patients Only)       Balance Overall balance assessment: Needs assistance   Sitting balance-Leahy Scale: Normal     Standing balance support: No upper extremity supported Standing balance-Leahy Scale: Fair                              Cognition Arousal/Alertness: Awake/alert Behavior During Therapy:  WFL for tasks assessed/performed Overall Cognitive Status: Within Functional Limits for tasks assessed                                        Exercises      General Comments        Pertinent Vitals/Pain Pain Assessment: 0-10 Pain Score: 4  Pain Location: bilat ankles with WB Pain Descriptors / Indicators: Grimacing;Sore Pain Intervention(s): Limited activity within patient's tolerance;Monitored during session    Home Living                      Prior Function            PT Goals (current goals can now be found in the care plan section) Acute Rehab PT Goals Patient Stated Goal: to return home PT Goal Formulation: With patient Time For Goal Achievement: 09/01/17 Potential to Achieve Goals: Good Progress towards PT goals: Progressing toward goals    Frequency    Min 3X/week      PT Plan Current plan remains appropriate    Co-evaluation              AM-PAC PT "6 Clicks" Daily Activity  Outcome Measure  Difficulty turning over in bed (including adjusting bedclothes, sheets and blankets)?: None Difficulty moving from lying on back to sitting on the side of  the bed? : None Difficulty sitting down on and standing up from a chair with arms (e.g., wheelchair, bedside commode, etc,.)?: A Little Help needed moving to and from a bed to chair (including a wheelchair)?: A Little Help needed walking in hospital room?: A Little Help needed climbing 3-5 steps with a railing? : A Lot 6 Click Score: 19    End of Session Equipment Utilized During Treatment: Gait belt Activity Tolerance: Patient tolerated treatment well Patient left: with call bell/phone within reach;with nursing/sitter in room;in bed Nurse Communication: Mobility status PT Visit Diagnosis: Difficulty in walking, not elsewhere classified (R26.2)     Time: 3383-2919 PT Time Calculation (min) (ACUTE ONLY): 27 min  Charges:  $Gait Training: 23-37 mins                    G  Codes:       [phone]    Abigail [name] 08/19/2017, 12:41 PM

## 2017-08-19 NOTE — Consult Note (Signed)
Ref: Nolene Ebbs, MD   Subjective:  Not bleeding but has significant weakness. VS stable. Some chest discomfort this AM. Hypokalemic.  Objective:  Vital Signs in the last 24 hours: Temp:  [98.8 F (37.1 C)-100.8 F (38.2 C)] 99.9 F (37.7 C) (10/31 0316) Pulse Rate:  [59] 59 (10/30 1200) Cardiac Rhythm: Junctional rhythm (10/31 0708) Resp:  [13-27] 18 (10/31 0800) BP: (98-149)/(37-73) 135/44 (10/31 0800) SpO2:  [94 %-100 %] 96 % (10/31 0400)  Physical Exam: BP Readings from Last 1 Encounters:  08/19/17 (!) 135/44    Wt Readings from Last 1 Encounters:  08/17/17 72 kg (158 lb 11.7 oz)    Weight change:  Body mass index is 26.01 kg/m. HEENT: Delta/AT, Eyes-Brown, PERL, EOMI, Conjunctiva-Pale, Sclera-Non-icteric Neck: No JVD, No bruit, Trachea midline. Lungs:  Clear, Bilateral. Cardiac:  Regular rhythm, normal S1 and S2, no S3. II/VI systolic murmur. Abdomen:  Soft, non-tender. BS present. Extremities:  No edema present. No cyanosis. No clubbing. CNS: AxOx3, Cranial nerves grossly intact, moves all 4 extremities.  Skin: Warm and dry.   Intake/Output from previous day: 10/30 0701 - 10/31 0700 In: 510 [P.O.:200; I.V.:160; IV Piggyback:150] Out: 2620 [Urine:4125; Stool:1]    Lab Results: BMET    Component Value Date/Time   NA 142 08/19/2017 0758   NA 139 08/17/2017 2100   NA 140 08/17/2017 0550   NA 141 07/30/2017 1504   NA 142 06/11/2017 1451   NA 141 04/09/2017 1357   K 3.1 (L) 08/19/2017 0758   K 3.7 08/17/2017 2100   K 4.1 08/17/2017 0550   K 4.8 07/30/2017 1504   K 4.3 06/11/2017 1451   K 4.3 04/09/2017 1357   CL 107 08/19/2017 0758   CL 112 (H) 08/17/2017 2100   CL 111 08/17/2017 0550   CO2 24 08/19/2017 0758   CO2 18 (L) 08/17/2017 2100   CO2 19 (L) 08/17/2017 0550   CO2 20 (L) 07/30/2017 1504   CO2 20 (L) 06/11/2017 1451   CO2 22 04/09/2017 1357   GLUCOSE 100 (H) 08/19/2017 0758   GLUCOSE 96 08/17/2017 2100   GLUCOSE 108 (H) 08/17/2017 0550   GLUCOSE 79 07/30/2017 1504   GLUCOSE 74 06/11/2017 1451   GLUCOSE 90 04/09/2017 1357   BUN 16 08/19/2017 0758   BUN 18 08/17/2017 2100   BUN 19 08/17/2017 0550   BUN 24.4 07/30/2017 1504   BUN 18.7 06/11/2017 1451   BUN 23.4 04/09/2017 1357   CREATININE 1.09 (H) 08/19/2017 0758   CREATININE 1.07 (H) 08/19/2017 0548   CREATININE 1.03 (H) 08/18/2017 0450   CREATININE 1.2 (H) 07/30/2017 1504   CREATININE 1.2 (H) 06/11/2017 1451   CREATININE 1.0 04/09/2017 1357   CALCIUM 8.2 (L) 08/19/2017 0758   CALCIUM 7.4 (L) 08/17/2017 2100   CALCIUM 8.4 (L) 08/17/2017 0550   CALCIUM 9.1 07/30/2017 1504   CALCIUM 9.6 06/11/2017 1451   CALCIUM 10.2 04/09/2017 1357   GFRNONAA 50 (L) 08/19/2017 0758   GFRNONAA 51 (L) 08/19/2017 0548   GFRNONAA 54 (L) 08/18/2017 0450   GFRAA 58 (L) 08/19/2017 0758   GFRAA 60 (L) 08/19/2017 0548   GFRAA >60 08/18/2017 0450   CBC    Component Value Date/Time   WBC 8.1 08/19/2017 0758   RBC 3.16 (L) 08/19/2017 0758   HGB 9.0 (L) 08/19/2017 0758   HGB 10.6 (L) 07/30/2017 1504   HCT 26.9 (L) 08/19/2017 0758   HCT 33.8 (L) 07/30/2017 1504   PLT 296 08/19/2017 0758  PLT 203 07/30/2017 1504   MCV 85.1 08/19/2017 0758   MCV 90.9 07/30/2017 1504   MCH 28.5 08/19/2017 0758   MCHC 33.5 08/19/2017 0758   RDW 15.5 08/19/2017 0758   RDW 16.6 (H) 07/30/2017 1504   LYMPHSABS 1.2 08/16/2017 1656   LYMPHSABS 1.3 07/30/2017 1504   MONOABS 1.7 (H) 08/16/2017 1656   MONOABS 0.4 07/30/2017 1504   EOSABS 0.0 08/16/2017 1656   EOSABS 0.1 07/30/2017 1504   BASOSABS 0.0 08/16/2017 1656   BASOSABS 0.0 07/30/2017 1504   HEPATIC Function Panel  Recent Labs  07/30/17 1504 07/30/17 1504 08/16/17 1656  PROT 6.6 6.1 7.1   HEMOGLOBIN A1C No components found for: HGA1C,  MPG CARDIAC ENZYMES Lab Results  Component Value Date   CKTOTAL 129 01/01/2012   CKMB 4.6 (H) 01/01/2012   TROPONINI 0.03 (HH) 06/23/2017   TROPONINI 0.03 (HH) 06/23/2017   TROPONINI 0.03 (HH)  06/23/2017   BNP No results for input(s): PROBNP in the last 8760 hours. TSH  Recent Labs  12/18/16 1056 12/22/16 2008 06/24/17 1046  TSH 0.576 0.481 0.844   CHOLESTEROL  Recent Labs  02/24/17 2028 03/05/17 0304 06/24/17 0545  CHOL 196 174 191    Scheduled Meds: . acyclovir  400 mg Oral BID  . amiodarone  100 mg Oral Daily  . aspirin  81 mg Oral Daily  . atorvastatin  80 mg Oral q1800  . Chlorhexidine Gluconate Cloth  6 each Topical Q0600  . ferrous sulfate  325 mg Oral Q breakfast  . furosemide  40 mg Oral Daily  . pantoprazole  40 mg Oral Daily  . [START ON 08/20/2017] potassium chloride  10 mEq Oral BID  . potassium chloride  40 mEq Oral BID  . pyridostigmine  30 mg Oral BID  . ticagrelor  90 mg Oral BID   Continuous Infusions: . sodium chloride    . ceFEPime (MAXIPIME) IV 1 g (08/19/17 0939)   PRN Meds:.acetaminophen **OR** acetaminophen, morphine injection, ondansetron **OR** ondansetron (ZOFRAN) IV, sodium chloride flush  Assessment/Plan: Acute lower GI bleed. Anemia of blood loss Lower leg DVT ruled out. CAD S/P stents in LAD and LCX Multiple myeloma H/O Apical LV thrombus Hypokalemia  Resume Brilinta Increase activity. Echocardiogram is pending Agree with potassium replacement.   LOS: 3 days    [name]  MD  08/19/2017, 10:09 AM

## 2017-08-19 NOTE — Progress Notes (Signed)
  Echocardiogram 2D Echocardiogram has been performed.  Abigail [name] T Sallye Lunz 08/19/2017, 1:30 PM

## 2017-08-19 NOTE — Progress Notes (Signed)
[facility] PROGRESS NOTE  Abigail [name] TDH:[id number] DOB: 07-Jul-1946 DOA: 08/16/2017  PCP: Nolene Ebbs, MD  Brief History/Interval Summary: 71 year old female with a past medical history of multiple myeloma in remission, history of DVT on Coumadin, chronic kidney disease stage III, coronary artery disease status post drug-eluting stent placement in March and then again in May, history of systolic congestive heart failure with ejection fraction of 40% presented with complains of rectal bleeding.  She was hospitalized for further management.  Reason for Visit: Diverticular bleed  Consultants: Cardiology  Procedures:  Transthoracic echocardiogram Pending.  Lower extremity venous Doppler No DVT noted in either extremity  Antibiotics: None  Subjective/Interval History: Patient has not had any further episodes of rectal bleeding since yesterday.  Complains of feeling fatigued at times.  Denies any chest pain shortness of breath.  Has been tolerating her diet.  ROS: Denies any nausea or vomiting  Objective:  Vital Signs  Vitals:   08/19/17 0316 08/19/17 0400 08/19/17 0800 08/19/17 1000  BP:  (!) 116/43 (!) 135/44 (!) 153/61  Pulse:      Resp:  _0 Temp: 99.9 F (37.7 C)  99.4 F (37.4 C)   TempSrc: Oral  Oral   SpO2:  96%  97%  Weight:      Height:        Intake/Output Summary (Last 24 hours) at 08/19/17 1215 Last data filed at 08/19/17 0554  Gross per 24 hour  Intake              200 ml  Output             2526 ml  Net            -2326 ml   Filed Weights   08/17/17 0219 08/17/17 1525  Weight: 72 kg (158 lb 11.7 oz) 72 kg (158 lb 11.7 oz)    General appearance: alert, cooperative, appears stated age and no distress Head: Normocephalic, without obvious abnormality, atraumatic Resp: clear to auscultation bilaterally Cardio: regular rate and rhythm, S1, S2 normal, no murmur, click, rub or gallop GI: soft, non-tender; bowel sounds normal; no masses,   no organomegaly Extremities: extremities normal, atraumatic, no cyanosis or edema Neurologic: No focal neurological deficits  Lab Results:  Data Reviewed: I have personally reviewed following labs and imaging studies  CBC:  Recent Labs Lab 08/16/17 1656  08/17/17 0550 08/17/17 1412 08/18/17 0450 08/18/17 1149 08/19/17 0758  WBC 11.1*  < > 9.0 11.0* 11.1* 11.2* 8.1  NEUTROABS 8.1*  --   --   --   --   --   --   HGB 8.3*  < > 9.4* 7.3* 8.6* 8.9* 9.0*  HCT 26.0*  < > 30.9* 22.1* 25.7* 26.4* 26.9*  MCV 90.3  < > 87.8 88.0 86.2 85.4 85.1  PLT 274  < > 223 262 231 254 296  < > = values in this interval not displayed.  Basic Metabolic Panel:  Recent Labs Lab 08/16/17 1656 08/17/17 0550 08/17/17 2100 08/18/17 0450 08/19/17 0548 08/19/17 0758  NA 142 140 139  --   --  142  K 5.0 4.1 3.7  --   --  3.1*  CL 110 111 112*  --   --  107  CO2 23 19* 18*  --   --  24  GLUCOSE 110* 108* 96  --   --  100*  BUN 22* 19 18  --   --  16  CREATININE 1.17* 0.99 0.97 1.03* 1.07* 1.09*  CALCIUM 8.8* 8.4* 7.4*  --   --  8.2*    GFR: Estimated Creatinine Clearance: 48.3 mL/min (A) (by C-G formula based on SCr of 1.09 mg/dL (H)).  Liver Function Tests:  Recent Labs Lab 08/16/17 1656  AST 22  ALT 10*  ALKPHOS 71  BILITOT 0.9  PROT 7.1  ALBUMIN 3.4*    Coagulation Profile:  Recent Labs Lab 08/17/17 0550 08/18/17 0450  INR 8.05* 1.84     Recent Results (from the past 240 hour(s))  Culture, blood (x 2)     Status: None (Preliminary result)   Collection Time: 08/17/17 12:44 AM  Result Value Ref Range Status   Specimen Description BLOOD BLOOD RIGHT HAND  Final   Special Requests IN PEDIATRIC BOTTLE Blood Culture adequate volume  Final   Culture   Final    NO GROWTH 1 DAY Performed at [facility], [place] [address] [place], [place] [address]    Report Status PENDING  Incomplete  Culture, blood (x 2)     Status: None (Preliminary result)   Collection Time:  08/17/17 12:44 AM  Result Value Ref Range Status   Specimen Description BLOOD BLOOD LEFT HAND  Final   Special Requests IN PEDIATRIC BOTTLE Blood Culture adequate volume  Final   Culture   Final    NO GROWTH 1 DAY Performed at [facility], [place] [address], [place], [place] [address]    Report Status PENDING  Incomplete  MRSA PCR Screening     Status: None   Collection Time: 08/17/17  4:08 PM  Result Value Ref Range Status   MRSA by PCR NEGATIVE NEGATIVE Final    Comment:        The GeneXpert MRSA Assay (FDA approved for NASAL specimens only), is one component of a comprehensive MRSA colonization surveillance program. It is not intended to diagnose MRSA infection nor to guide or monitor treatment for MRSA infections.       Radiology Studies: No results found.   Medications:  Scheduled: . acyclovir  400 mg Oral BID  . amiodarone  100 mg Oral Daily  . aspirin  81 mg Oral Daily  . atorvastatin  80 mg Oral q1800  . Chlorhexidine Gluconate Cloth  6 each Topical Q0600  . ferrous sulfate  325 mg Oral Q breakfast  . furosemide  40 mg Oral Daily  . pantoprazole  40 mg Oral Daily  . [START ON 08/20/2017] potassium chloride  10 mEq Oral BID  . potassium chloride  40 mEq Oral BID  . pyridostigmine  30 mg Oral BID  . ticagrelor  90 mg Oral BID   Continuous: . sodium chloride    . ceFEPime (MAXIPIME) IV Stopped (08/19/17 1009)   [id number] **OR** acetaminophen, morphine injection, ondansetron **OR** ondansetron (ZOFRAN) IV, sodium chloride flush  Assessment/Plan:  Principal Problem:   GI bleed Active Problems:   Multiple myeloma (HCC)   Essential hypertension   Fever   Sepsis ([place])    Lower GI bleed Most likely diverticular in the setting of supratherapeutic INR.  Appears to have subsided.  She is tolerating her diet.  Her last colonoscopy was in 2016 which showed 2 polyps and diverticulosis.  Since her bleeding has subsided there is no need to involve  gastroenterology.  Usually followed by Dr. Hilarie Fredrickson.  Continue to monitor for now.  Acute blood loss anemia Patient has been transfused 2 units of PRBC so far.  She also received fresh frozen plasma and vitamin K due to elevated INR.  Hemoglobin has been stable.  Continue to monitor.  Supratherapeutic INR in setting of anticoagulation for DVT INR was greater than 8.  She was given FFP's and vitamin K.  INR improved subsequently.  Sepsis due to healthcare associated pneumonia Patient was noted to have pneumonia on the chest x-ray and CT scan.  Placed on vancomycin and cefepime..  Vancomycin has been discontinued.  Continue cefepime for now.  History of DVT on warfarin Previous history of lower extremity DVT.  Lower extremity venous Doppler done during this hospitalization does not show any DVT.  There is no need to continue anticoagulation at this time.  History of coronary artery disease with drug-eluting stent placement in March and May Patient has been on Brilinta.  Cardiology consulted.  Since bleeding has subsided they initiated aspirin yesterday and they plan to initiate Brilinta soon.  She remains stable from a cardiac standpoint.  Patient also noted to be on amiodarone.  History of chronic systolic congestive heart failure/hypokalemia EF is noted to be 40-45% based on echocardiogram from May.  Repeat echocardiogram is pending.  She was given intravenous Lasix by cardiology.  Appears to be euvolemic currently.  Defer initiation of ACE inhibitor the outpatient setting since her creatinine has been elevated here.  Replace potassium.  History of multiple myeloma This is in remission.  History of essential hypertension Blood pressure medications on hold.  DVT Prophylaxis: SCDs    Code Status: Full code Family Communication: Discussed with the patient Disposition Plan: Mobilize.  Okay for transfer to telemetry floor.    LOS: 3 days   [facility] Pager  [phone] 08/19/2017, 12:15 PM  If 7PM-7AM, please contact night-coverage at [email or web address], password [facility]

## 2017-08-20 DIAGNOSIS — K625 Hemorrhage of anus and rectum: Secondary | ICD-10-CM

## 2017-08-20 DIAGNOSIS — I2510 Atherosclerotic heart disease of native coronary artery without angina pectoris: Secondary | ICD-10-CM

## 2017-08-20 LAB — BASIC METABOLIC PANEL
ANION GAP: 8 (ref 5–15)
BUN: 16 mg/dL (ref 6–20)
CALCIUM: 8.3 mg/dL — AB (ref 8.9–10.3)
CO2: 22 mmol/L (ref 22–32)
Chloride: 110 mmol/L (ref 101–111)
Creatinine, Ser: 1.03 mg/dL — ABNORMAL HIGH (ref 0.44–1.00)
GFR calc Af Amer: >60 mL/min (ref >60)
GFR, EST NON AFRICAN AMERICAN: 54 mL/min — AB (ref >60)
GLUCOSE: 98 mg/dL (ref 65–99)
Potassium: 4.2 mmol/L (ref 3.5–5.1)
SODIUM: 140 mmol/L (ref 135–145)

## 2017-08-20 LAB — CBC
HCT: 27.8 % — ABNORMAL LOW (ref 36.0–46.0)
Hemoglobin: 9 g/dL — ABNORMAL LOW (ref 12.0–15.0)
MCH: 28.2 pg (ref 26.0–34.0)
MCHC: 32.4 g/dL (ref 30.0–36.0)
MCV: 87.1 fL (ref 78.0–100.0)
PLATELETS: 370 10*3/uL (ref 150–400)
RBC: 3.19 MIL/uL — ABNORMAL LOW (ref 3.87–5.11)
RDW: 15 % (ref 11.5–15.5)
WBC: 9.2 10*3/uL (ref 4.0–10.5)

## 2017-08-20 LAB — MAGNESIUM: MAGNESIUM: 1.6 mg/dL — AB (ref 1.7–2.4)

## 2017-08-20 MED ORDER — GABAPENTIN 100 MG PO CAPS
100.0000 mg | ORAL_CAPSULE | Freq: Three times a day (TID) | ORAL | Status: DC
  Administered 2017-08-20 – 2017-08-21 (×2): 100 mg via ORAL
  Filled 2017-08-20 (×2): qty 1

## 2017-08-20 MED ORDER — AMOXICILLIN-POT CLAVULANATE 875-125 MG PO TABS
1.0000 | ORAL_TABLET | Freq: Two times a day (BID) | ORAL | Status: DC
  Administered 2017-08-20 – 2017-08-21 (×3): 1 via ORAL
  Filled 2017-08-20 (×3): qty 1

## 2017-08-20 MED ORDER — MAGNESIUM SULFATE 2 GM/50ML IV SOLN
2.0000 g | Freq: Once | INTRAVENOUS | Status: AC
  Administered 2017-08-20: 2 g via INTRAVENOUS
  Filled 2017-08-20: qty 50

## 2017-08-20 NOTE — Progress Notes (Signed)
[facility] PROGRESS NOTE  Abigail [name] WGN:[id number] DOB: 05-27-1946 DOA: 08/16/2017  PCP: Nolene Ebbs, MD  Brief History/Interval Summary: 71 year old female with a past medical history of multiple myeloma in remission, history of DVT on Coumadin, chronic kidney disease stage III, coronary artery disease status post drug-eluting stent placement in March and then again in May, history of systolic congestive heart failure with ejection fraction of 40% presented with complains of rectal bleeding.  She was hospitalized for further management.  Reason for Visit: Diverticular bleed  Consultants: Cardiology  Procedures:  Transthoracic echocardiogram Study Conclusions  - Left ventricle: The cavity size was normal. Systolic function was   mildly to moderately reduced. The estimated ejection fraction was   in the range of 40% to 45%. Severe hypokinesis of the   mid-apicalanterior and apical myocardium; in the distribution of   the left anterior descending coronary artery. - Aortic valve: Valve area (Vmax): 1.56 cm^2. - Mitral valve: There was mild regurgitation. - Left atrium: The atrium was moderately dilated. - Right atrium: The atrium was mildly dilated. - Pulmonary arteries: PA peak pressure: 50 mm Hg (S). - Inferior vena cava: The vessel was normal in size. The   respirophasic diameter changes were blunted (< 50%), consistent   with elevated central venous pressure.  Impressions:  - The right ventricular systolic pressure was increased consistent   with moderate pulmonary hypertension.  Lower extremity venous Doppler No DVT noted in either extremity  Antibiotics: None  Subjective/Interval History: Patient has not had any further episodes of rectal bleeding.  She feels better than yesterday.  Denies any Chest pain or shortness of breath.  Requesting that she be given solid foods.  ROS: Denies any nausea or vomiting.  Objective:  Vital Signs  Vitals:   08/20/17 0324 08/20/17 0700 08/20/17 0750 08/20/17 0800  BP:    (!) 138/51  Pulse:      Resp:  (!) 21 (!) 23 (!) 22  Temp: 99.8 F (37.7 C)     TempSrc: Axillary     SpO2:  97% 99% 98%  Weight:      Height:        Intake/Output Summary (Last 24 hours) at 08/20/17 0804 Last data filed at 08/20/17 0750  Gross per 24 hour  Intake              590 ml  Output              750 ml  Net             -160 ml   Filed Weights   08/17/17 0219 08/17/17 1525  Weight: 72 kg (158 lb 11.7 oz) 72 kg (158 lb 11.7 oz)    General appearance: Awake alert.  In no distress Resp: Clear to auscultation bilaterally Cardio: S1-S2 is normal regular.  No S3-S4.  No rubs murmurs or bruit GI: Abdomen is soft.  Nontender nondistended.  Bowel sounds are present.  No masses organomegaly Extremities: No edema Neurologic: No focal neurological deficits  Lab Results:  Data Reviewed: I have personally reviewed following labs and imaging studies  CBC:  Recent Labs Lab 08/16/17 1656  08/17/17 1412 08/18/17 0450 08/18/17 1149 08/19/17 0758 08/20/17 0500  WBC 11.1*  < > 11.0* 11.1* 11.2* 8.1 9.2  NEUTROABS 8.1*  --   --   --   --   --   --   HGB 8.3*  < > 7.3* 8.6* 8.9* 9.0* 9.0*  HCT 26.0*  < >  22.1* 25.7* 26.4* 26.9* 27.8*  MCV 90.3  < > 88.0 86.2 85.4 85.1 87.1  PLT 274  < > 262 231 254 296 370  < > = values in this interval not displayed.  Basic Metabolic Panel:  Recent Labs Lab 08/16/17 1656 08/17/17 0550 08/17/17 2100 08/18/17 0450 08/19/17 0548 08/19/17 0758 08/20/17 0500  NA 142 140 139  --   --  142 140  K 5.0 4.1 3.7  --   --  3.1* 4.2  CL 110 111 112*  --   --  107 110  CO2 23 19* 18*  --   --  24 22  GLUCOSE 110* 108* 96  --   --  100* 98  BUN 22* 19 18  --   --  16 16  CREATININE 1.17* 0.99 0.97 1.03* 1.07* 1.09* 1.03*  CALCIUM 8.8* 8.4* 7.4*  --   --  8.2* 8.3*  MG  --   --   --   --   --   --  1.6*    GFR: Estimated Creatinine Clearance: 51.1 mL/min (A) (by C-G formula  based on SCr of 1.03 mg/dL (H)).  Liver Function Tests:  Recent Labs Lab 08/16/17 1656  AST 22  ALT 10*  ALKPHOS 71  BILITOT 0.9  PROT 7.1  ALBUMIN 3.4*    Coagulation Profile:  Recent Labs Lab 08/17/17 0550 08/18/17 0450  INR 8.05* 1.84     Recent Results (from the past 240 hour(s))  Culture, blood (x 2)     Status: None (Preliminary result)   Collection Time: 08/17/17 12:44 AM  Result Value Ref Range Status   Specimen Description BLOOD BLOOD RIGHT HAND  Final   Special Requests IN PEDIATRIC BOTTLE Blood Culture adequate volume  Final   Culture   Final    NO GROWTH 2 DAYS Performed at [facility], [place] [address], [place], [place] [address]    Report Status PENDING  Incomplete  Culture, blood (x 2)     Status: None (Preliminary result)   Collection Time: 08/17/17 12:44 AM  Result Value Ref Range Status   Specimen Description BLOOD BLOOD LEFT HAND  Final   Special Requests IN PEDIATRIC BOTTLE Blood Culture adequate volume  Final   Culture   Final    NO GROWTH 2 DAYS Performed at [facility], [place] [address], [place], [place] [address]    Report Status PENDING  Incomplete  MRSA PCR Screening     Status: None   Collection Time: 08/17/17  4:08 PM  Result Value Ref Range Status   MRSA by PCR NEGATIVE NEGATIVE Final    Comment:        The GeneXpert MRSA Assay (FDA approved for NASAL specimens only), is one component of a comprehensive MRSA colonization surveillance program. It is not intended to diagnose MRSA infection nor to guide or monitor treatment for MRSA infections.       Radiology Studies: No results found.   Medications:  Scheduled: . acyclovir  400 mg Oral BID  . amiodarone  100 mg Oral Daily  . amoxicillin-clavulanate  1 tablet Oral Q12H  . aspirin  81 mg Oral Daily  . atorvastatin  80 mg Oral q1800  . Chlorhexidine Gluconate Cloth  6 each Topical Q0600  . ferrous sulfate  325 mg Oral Q breakfast  . furosemide  40 mg Oral  Daily  . pantoprazole  40 mg Oral Daily  . potassium chloride  10 mEq Oral BID  . pyridostigmine  30 mg Oral BID  . ticagrelor  90 mg Oral BID   Continuous: . sodium chloride     [id number] **OR** acetaminophen, morphine injection, ondansetron **OR** ondansetron (ZOFRAN) IV, sodium chloride flush  Assessment/Plan:  Principal Problem:   GI bleed Active Problems:   Multiple myeloma (HCC)   Essential hypertension   Fever   Sepsis ([place])    Lower GI bleed Most likely diverticular in the setting of supratherapeutic INR.  Appears to have subsided.  Her last colonoscopy was in 2016 which showed 2 polyps and diverticulosis.  Since her bleeding has subsided there is no need to involve gastroenterology.  Usually followed by Dr. Hilarie Fredrickson.  Advance diet.  Acute blood loss anemia Patient has been transfused 2 units of PRBC so far.  She also received fresh frozen plasma and vitamin K due to elevated INR.  Hemoglobin has been stable.  Continue to monitor.  Supratherapeutic INR in setting of anticoagulation for DVT INR was greater than 8.  She was given FFP's and vitamin K.  INR improved subsequently.  She will remain off of Coumadin.  Sepsis due to healthcare associated pneumonia Patient was noted to have pneumonia on the chest x-ray and CT scan.  Placed on vancomycin and cefepime..  Vancomycin has been discontinued.  Cultures negative so far.  Symptomatically she has improved.  She will be changed over to oral antibiotic.  Start Augmentin.  History of DVT on warfarin Previous history of lower extremity DVT.  Lower extremity venous Doppler done during this hospitalization does not show any DVT.  There is no need to continue anticoagulation at this time.  History of coronary artery disease with drug-eluting stent placement in March and May Patient has been on Brilinta.  Cardiology consulted.  Aspirin was initiated on 10/30.  Pertinent initiated 10/31.  Patient remains stable from a cardiac  standpoint.  She is also noted to be on amiodarone.  She is noted to be bradycardic but stable and asymptomatic.    History of chronic systolic congestive heart failure/hypokalemia/hypomagnesemia EF is noted to be 40-45% based on echocardiogram from May.  Repeat echocardiogram is as above.  Ejection fraction is about the same.  Further management per cardiology.  She was given intravenous Lasix by cardiology and is now on oral Lasix.  Appears to be euvolemic currently.  Defer initiation of ACE inhibitor to the outpatient setting since her creatinine has been elevated here.  Creatinine improving slowly.  Mild rise in creatinine probably due to diuretics.  Replace potassium and magnesium.  History of multiple myeloma This is in remission.  History of essential hypertension Stable.  Continue to monitor  DVT Prophylaxis: SCDs    Code Status: Full code Family Communication: Discussed with the patient Disposition Plan: Continue to mobilize.  Anticipate discharge tomorrow.    LOS: 4 days   [facility] Pager [phone] 08/20/2017, 8:04 AM  If 7PM-7AM, please contact night-coverage at [email or web address], password [facility]

## 2017-08-20 NOTE — Progress Notes (Signed)
Patient transferred from ICU. VSS tele applied. Agree with previous RN assessment. Denies any needs at this time. Will continue to monitor.

## 2017-08-20 NOTE — Progress Notes (Signed)
Physical Therapy Treatment Patient Details Name: Abigail [name] MRN: [id number] DOB: Aug 22, 1946 Today's Date: 08/20/2017    History of Present Illness P Filo is a 71 y.o. female with medical history significant of multiple myeloma; DVT on COumadin; CAD; HTN; HLD; CKD stage 3 with h/o B RAS s/p stenting; and chronic anemia presenting because she started passing a lot of blood in her stool. Hgb 8.7, INR >8.    PT Comments    Pt having significant pain in B lateral ankles just inferior to lateral malleoli at rest and more so with weight bearing. Pt denies injury to ankles, stated pain started during yesterday's PT session, she denied h/o gout. Pt only able to ambulate 12' today, limited by pain, which is significant decline from yesterday when she walked 150'. RN notified.    Follow Up Recommendations  ;Home health PT     Equipment Recommendations  None recommended by PT    Recommendations for Other Services       Precautions / Restrictions Precautions Precautions: Fall Precaution Comments: monitor BP Restrictions Weight Bearing Restrictions: No    Mobility  Bed Mobility Overal bed mobility: Modified Independent             General bed mobility comments: Pt unassisted supine<>sit   Transfers Overall transfer level: Needs assistance Equipment used: Rolling walker (2 wheeled);None Transfers: Sit to/from Stand Sit to Stand: Supervision         General transfer comment: pt reported B lateral ankle pain with weightbearing, noted pt weightbears on heels to keep weight off toes  Ambulation/Gait Ambulation/Gait assistance: Min assist;Min guard Ambulation Distance (Feet): 12 Feet Assistive device: Rolling walker (2 wheeled) Gait Pattern/deviations: Step-to pattern;Step-through pattern Gait velocity: decr Gait velocity interpretation: Below normal speed for age/gender General Gait Details: distance limited by B ankle pain   Stairs            Wheelchair Mobility     Modified Rankin (Stroke Patients Only)       Balance Overall balance assessment: Needs assistance   Sitting balance-Leahy Scale: Normal     Standing balance support: No upper extremity supported Standing balance-Leahy Scale: Fair                              Cognition Arousal/Alertness: Awake/alert Behavior During Therapy: WFL for tasks assessed/performed Overall Cognitive Status: Within Functional Limits for tasks assessed                                        Exercises      General Comments        Pertinent Vitals/Pain Pain Score: 7  Pain Location: bilat ankles with weight bearing Pain Descriptors / Indicators: Grimacing Pain Intervention(s): Limited activity within patient's tolerance;Monitored during session;Premedicated before session;Patient requesting pain meds-RN notified    Home Living                      Prior Function            PT Goals (current goals can now be found in the care plan section) Acute Rehab PT Goals Patient Stated Goal: to return home PT Goal Formulation: With patient Time For Goal Achievement: 09/01/17 Potential to Achieve Goals: Good Progress towards PT goals: Not progressing toward goals - comment (pain in B ankles limiting activity tolerance)  Frequency    Min 3X/week      PT Plan Current plan remains appropriate    Co-evaluation              AM-PAC PT "6 Clicks" Daily Activity  Outcome Measure  Difficulty turning over in bed (including adjusting bedclothes, sheets and blankets)?: None Difficulty moving from lying on back to sitting on the side of the bed? : None Difficulty sitting down on and standing up from a chair with arms (e.g., wheelchair, bedside commode, etc,.)?: A Little Help needed moving to and from a bed to chair (including a wheelchair)?: A Little Help needed walking in hospital room?: A Little Help needed climbing 3-5 steps with a railing? : A Lot 6  Click Score: 19    End of Session Equipment Utilized During Treatment: Gait belt Activity Tolerance: Patient limited by pain Patient left: with call bell/phone within reach;in chair Nurse Communication: Mobility status;Other (comment) (decline in activity tolerance 2* B ankle pain) PT Visit Diagnosis: Difficulty in walking, not elsewhere classified (R26.2);Pain Pain - Right/Left:  (both) Pain - part of body: Ankle and joints of foot     Time: 2703-5009 PT Time Calculation (min) (ACUTE ONLY): 22 min  Charges:  $Gait Training: 8-22 mins                    G Codes:          Philomena Doheny 08/20/2017, 12:33 PM [phone]

## 2017-08-20 NOTE — Consult Note (Signed)
Ref: Nolene Ebbs, MD   Subjective:  No chest pain. Feels cold. Hgb stable post diuresis. Hypokalemia resolves post potassium replacement. Echocardiogram negative for mobile thrombus but akinetic apex of heart. T max 100.7. C/O pain in feet when tries to walk. Monitor shows sinus rhythm.  Objective:  Vital Signs in the last 24 hours: Temp:  [97.6 F (36.4 C)-100.7 F (38.2 C)] 97.6 F (36.4 C) (11/01 1711) Pulse Rate:  [80] 80 (11/01 1711) Cardiac Rhythm: Sinus bradycardia (11/01 1726) Resp:  [11-26] 20 (11/01 1711) BP: (115-164)/(45-112) 115/71 (11/01 1711) SpO2:  [96 %-100 %] 99 % (11/01 1711)  Physical Exam: BP Readings from Last 1 Encounters:  08/20/17 115/71    Wt Readings from Last 1 Encounters:  08/17/17 72 kg (158 lb 11.7 oz)    Weight change:  Body mass index is 26.01 kg/m. HEENT: Abigail [name]/AT, Eyes-Brown, PERL, EOMI, Conjunctiva-Pale pink, Sclera-Non-icteric Neck: No JVD, No bruit, Trachea midline. Lungs:  Clear, Bilateral. Cardiac:  Regular rhythm, normal S1 and S2, no S3. II/VI systolic murmur. Abdomen:  Soft, non-tender. BS present. Extremities:  No edema present. No cyanosis. No clubbing. CNS: AxOx3, Cranial nerves grossly intact, moves all 4 extremities.  Skin: Warm and dry.   Intake/Output from previous day: 10/31 0701 - 11/01 0700 In: 31 [P.O.:350; I.V.:90; IV Piggyback:150] Out: 750 [Urine:750]    Lab Results: BMET    Component Value Date/Time   NA 140 08/20/2017 0500   NA 142 08/19/2017 0758   NA 139 08/17/2017 2100   NA 141 07/30/2017 1504   NA 142 06/11/2017 1451   NA 141 04/09/2017 1357   K 4.2 08/20/2017 0500   K 3.1 (L) 08/19/2017 0758   K 3.7 08/17/2017 2100   K 4.8 07/30/2017 1504   K 4.3 06/11/2017 1451   K 4.3 04/09/2017 1357   CL 110 08/20/2017 0500   CL 107 08/19/2017 0758   CL 112 (H) 08/17/2017 2100   CO2 22 08/20/2017 0500   CO2 24 08/19/2017 0758   CO2 18 (L) 08/17/2017 2100   CO2 20 (L) 07/30/2017 1504   CO2 20 (L)  06/11/2017 1451   CO2 22 04/09/2017 1357   GLUCOSE 98 08/20/2017 0500   GLUCOSE 100 (H) 08/19/2017 0758   GLUCOSE 96 08/17/2017 2100   GLUCOSE 79 07/30/2017 1504   GLUCOSE 74 06/11/2017 1451   GLUCOSE 90 04/09/2017 1357   BUN 16 08/20/2017 0500   BUN 16 08/19/2017 0758   BUN 18 08/17/2017 2100   BUN 24.4 07/30/2017 1504   BUN 18.7 06/11/2017 1451   BUN 23.4 04/09/2017 1357   CREATININE 1.03 (H) 08/20/2017 0500   CREATININE 1.09 (H) 08/19/2017 0758   CREATININE 1.07 (H) 08/19/2017 0548   CREATININE 1.2 (H) 07/30/2017 1504   CREATININE 1.2 (H) 06/11/2017 1451   CREATININE 1.0 04/09/2017 1357   CALCIUM 8.3 (L) 08/20/2017 0500   CALCIUM 8.2 (L) 08/19/2017 0758   CALCIUM 7.4 (L) 08/17/2017 2100   CALCIUM 9.1 07/30/2017 1504   CALCIUM 9.6 06/11/2017 1451   CALCIUM 10.2 04/09/2017 1357   GFRNONAA 54 (L) 08/20/2017 0500   GFRNONAA 50 (L) 08/19/2017 0758   GFRNONAA 51 (L) 08/19/2017 0548   GFRAA >60 08/20/2017 0500   GFRAA 58 (L) 08/19/2017 0758   GFRAA 60 (L) 08/19/2017 0548   CBC    Component Value Date/Time   WBC 9.2 08/20/2017 0500   RBC 3.19 (L) 08/20/2017 0500   HGB 9.0 (L) 08/20/2017 0500   HGB 10.6 (L)  07/30/2017 1504   HCT 27.8 (L) 08/20/2017 0500   HCT 33.8 (L) 07/30/2017 1504   PLT 370 08/20/2017 0500   PLT 203 07/30/2017 1504   MCV 87.1 08/20/2017 0500   MCV 90.9 07/30/2017 1504   MCH 28.2 08/20/2017 0500   MCHC 32.4 08/20/2017 0500   RDW 15.0 08/20/2017 0500   RDW 16.6 (H) 07/30/2017 1504   LYMPHSABS 1.2 08/16/2017 1656   LYMPHSABS 1.3 07/30/2017 1504   MONOABS 1.7 (H) 08/16/2017 1656   MONOABS 0.4 07/30/2017 1504   EOSABS 0.0 08/16/2017 1656   EOSABS 0.1 07/30/2017 1504   BASOSABS 0.0 08/16/2017 1656   BASOSABS 0.0 07/30/2017 1504   HEPATIC Function Panel  Recent Labs  07/30/17 1504 07/30/17 1504 08/16/17 1656  PROT 6.6 6.1 7.1   HEMOGLOBIN A1C No components found for: HGA1C,  MPG CARDIAC ENZYMES Lab Results  Component Value Date   CKTOTAL  129 01/01/2012   CKMB 4.6 (H) 01/01/2012   TROPONINI 0.03 (HH) 06/23/2017   TROPONINI 0.03 (HH) 06/23/2017   TROPONINI 0.03 (HH) 06/23/2017   BNP No results for input(s): PROBNP in the last 8760 hours. TSH  Recent Labs  12/18/16 1056 12/22/16 2008 06/24/17 1046  TSH 0.576 0.481 0.844   CHOLESTEROL  Recent Labs  02/24/17 2028 03/05/17 0304 06/24/17 0545  CHOL 196 174 191    Scheduled Meds: . acyclovir  400 mg Oral BID  . amiodarone  100 mg Oral Daily  . amoxicillin-clavulanate  1 tablet Oral Q12H  . aspirin  81 mg Oral Daily  . atorvastatin  80 mg Oral q1800  . Chlorhexidine Gluconate Cloth  6 each Topical Q0600  . ferrous sulfate  325 mg Oral Q breakfast  . furosemide  40 mg Oral Daily  . pantoprazole  40 mg Oral Daily  . potassium chloride  10 mEq Oral BID  . pyridostigmine  30 mg Oral BID  . ticagrelor  90 mg Oral BID   Continuous Infusions: . sodium chloride     PRN Meds:.acetaminophen **OR** acetaminophen, morphine injection, ondansetron **OR** ondansetron (ZOFRAN) IV, sodium chloride flush  Assessment/Plan: Acute lower GI bleed Anemia of blood loss Lower leg DVT ruled out Coronary artery disease Status post stents in LAD and LCx Multiple myeloma Hypokalemia  Continue medical treatment. Add gabapentin for feet pain. Increase activity as tolerated.   LOS: 4 days    Abigail Dials  MD  08/20/2017, 6:48 PM

## 2017-08-21 LAB — CBC
HEMATOCRIT: 27 % — AB (ref 36.0–46.0)
Hemoglobin: 8.9 g/dL — ABNORMAL LOW (ref 12.0–15.0)
MCH: 28.7 pg (ref 26.0–34.0)
MCHC: 33 g/dL (ref 30.0–36.0)
MCV: 87.1 fL (ref 78.0–100.0)
Platelets: 412 10*3/uL — ABNORMAL HIGH (ref 150–400)
RBC: 3.1 MIL/uL — AB (ref 3.87–5.11)
RDW: 15.2 % (ref 11.5–15.5)
WBC: 9.8 10*3/uL (ref 4.0–10.5)

## 2017-08-21 LAB — BASIC METABOLIC PANEL
ANION GAP: 8 (ref 5–15)
BUN: 24 mg/dL — AB (ref 6–20)
CO2: 24 mmol/L (ref 22–32)
Calcium: 8.6 mg/dL — ABNORMAL LOW (ref 8.9–10.3)
Chloride: 110 mmol/L (ref 101–111)
Creatinine, Ser: 1.21 mg/dL — ABNORMAL HIGH (ref 0.44–1.00)
GFR calc Af Amer: 51 mL/min — ABNORMAL LOW (ref >60)
GFR calc non Af Amer: 44 mL/min — ABNORMAL LOW (ref >60)
GLUCOSE: 106 mg/dL — AB (ref 65–99)
POTASSIUM: 3.9 mmol/L (ref 3.5–5.1)
Sodium: 142 mmol/L (ref 135–145)

## 2017-08-21 LAB — CK TOTAL AND CKMB (NOT AT ARMC)
CK, MB: 1.3 ng/mL (ref 0.5–5.0)
RELATIVE INDEX: INVALID (ref 0.0–2.5)
Total CK: 52 U/L (ref 38–234)

## 2017-08-21 MED ORDER — TRAMADOL HCL 50 MG PO TABS: 50.0000 mg | ORAL_TABLET | Freq: Four times a day (QID) | ORAL | 0 refills | Status: DC | PRN

## 2017-08-21 MED ORDER — FUROSEMIDE 40 MG PO TABS: 40.0000 mg | ORAL_TABLET | Freq: Every day | ORAL | Status: DC

## 2017-08-21 MED ORDER — TRAMADOL HCL 50 MG PO TABS: 50.0000 mg | ORAL_TABLET | Freq: Four times a day (QID) | ORAL | Status: DC | PRN

## 2017-08-21 MED ORDER — DICLOFENAC SODIUM 1 % TD GEL: 2.0000 g | Freq: Four times a day (QID) | TRANSDERMAL | 0 refills | Status: DC

## 2017-08-21 MED ORDER — AMOXICILLIN-POT CLAVULANATE 875-125 MG PO TABS: 1.0000 | ORAL_TABLET | Freq: Two times a day (BID) | ORAL | 0 refills | Status: AC

## 2017-08-21 MED ORDER — GABAPENTIN 100 MG PO CAPS: 100.0000 mg | ORAL_CAPSULE | Freq: Three times a day (TID) | ORAL | 0 refills | Status: DC

## 2017-08-21 MED ORDER — ASPIRIN EC 81 MG PO TBEC: 81.0000 mg | DELAYED_RELEASE_TABLET | Freq: Every day | ORAL | 2 refills | Status: DC

## 2017-08-21 MED ORDER — DICLOFENAC SODIUM 1 % TD GEL
2.0000 g | Freq: Four times a day (QID) | TRANSDERMAL | Status: DC
  Administered 2017-08-21: 12:00:00 2 g via TOPICAL
  Filled 2017-08-21: qty 100

## 2017-08-21 NOTE — Discharge Instructions (Signed)
Diverticulosis  Diverticulosis is a condition that develops when small pouches (diverticula) form in the wall of the large intestine (colon). The colon is where water is absorbed and stool is formed. The pouches form when the inside layer of the colon pushes through weak spots in the outer layers of the colon. You may have a few pouches or many of them.  What are the causes?  The cause of this condition is not known.  What increases the risk?  The following factors may make you more likely to develop this condition:   Being older than age 60. Your risk for this condition increases with age. Diverticulosis is rare among people younger than age 30. By age 80, many people have it.   Eating a low-fiber diet.   Having frequent constipation.   Being overweight.   Not getting enough exercise.   Smoking.   Taking over-the-counter pain medicines, like aspirin and ibuprofen.   Having a family history of diverticulosis.    What are the signs or symptoms?  In most people, there are no symptoms of this condition. If you do have symptoms, they may include:   Bloating.   Cramps in the abdomen.   Constipation or diarrhea.   Pain in the lower left side of the abdomen.    How is this diagnosed?  This condition is most often diagnosed during an exam for other colon problems. Because diverticulosis usually has no symptoms, it often cannot be diagnosed independently. This condition may be diagnosed by:   Using a flexible scope to examine the colon (colonoscopy).   Taking an X-ray of the colon after dye has been put into the colon (barium enema).   Doing a CT scan.    How is this treated?  You may not need treatment for this condition if you have never developed an infection related to diverticulosis. If you have had an infection before, treatment may include:   Eating a high-fiber diet. This may include eating more fruits, vegetables, and grains.   Taking a fiber supplement.   Taking a live bacteria supplement  (probiotic).   Taking medicine to relax your colon.   Taking antibiotic medicines.    Follow these instructions at home:   Drink 6-8 glasses of water or more each day to prevent constipation.   Try not to strain when you have a bowel movement.   If you have had an infection before:  ? Eat more fiber as directed by your health care provider or your diet and nutrition specialist (dietitian).  ? Take a fiber supplement or probiotic, if your health care provider approves.   Take over-the-counter and prescription medicines only as told by your health care provider.   If you were prescribed an antibiotic, take it as told by your health care provider. Do not stop taking the antibiotic even if you start to feel better.   Keep all follow-up visits as told by your health care provider. This is important.  Contact a health care provider if:   You have pain in your abdomen.   You have bloating.   You have cramps.   You have not had a bowel movement in 3 days.  Get help right away if:   Your pain gets worse.   Your bloating becomes very bad.   You have a fever or chills, and your symptoms suddenly get worse.   You vomit.   You have bowel movements that are bloody or black.   You have   bleeding from your rectum.  Summary   Diverticulosis is a condition that develops when small pouches (diverticula) form in the wall of the large intestine (colon).   You may have a few pouches or many of them.   This condition is most often diagnosed during an exam for other colon problems.   If you have had an infection related to diverticulosis, treatment may include increasing the fiber in your diet, taking supplements, or taking medicines.  This information is not intended to replace advice given to you by your health care provider. Make sure you discuss any questions you have with your health care provider.  Document Released: 07/03/2004 Document Revised: 08/25/2016 Document Reviewed: 08/25/2016  Elsevier Interactive  Patient Education  2017 [employer]

## 2017-08-21 NOTE — Consult Note (Signed)
Ref: Nolene Ebbs, MD   Subjective:  In tears trying to walk even 2 feet. VS stable. Mild bilateral calf pain.  Objective:  Vital Signs in the last 24 hours: Temp:  [97.6 F (36.4 C)-99.5 F (37.5 C)] 98.9 F (37.2 C) (11/02 0427) Pulse Rate:  [53-80] 53 (11/02 0427) Cardiac Rhythm: Normal sinus rhythm (11/01 1900) Resp:  [11-26] 21 (11/02 0427) BP: (105-164)/(45-112) 105/61 (11/02 0427) SpO2:  [98 %-100 %] 98 % (11/02 0427)  Physical Exam: BP Readings from Last 1 Encounters:  08/21/17 105/61    Wt Readings from Last 1 Encounters:  08/17/17 72 kg (158 lb 11.7 oz)    Weight change:  Body mass index is 26.01 kg/m. HEENT: [place]/AT, Eyes-Brown, PERL, EOMI, Conjunctiva-Pale pink, Sclera-Non-icteric Neck: No JVD, No bruit, Trachea midline. Lungs:  Clear, Bilateral. Cardiac:  Regular rhythm, normal S1 and S2, no S3. II/VI systolic murmur. Abdomen:  Soft, non-tender. BS present. Extremities:  No edema present. No cyanosis. No clubbing. Tenderness over metatarsals of both feet more dorsal aspect than [place] surface. CNS: AxOx3, Cranial nerves grossly intact, moves all 4 extremities.  Skin: Warm and dry.   Intake/Output from previous day: 11/01 0701 - 11/02 0700 In: 210 [P.O.:120; I.V.:10; IV Piggyback:50] Out: 1025 [Urine:1025]    Lab Results: BMET    Component Value Date/Time   NA 142 08/21/2017 0520   NA 140 08/20/2017 0500   NA 142 08/19/2017 0758   NA 141 07/30/2017 1504   NA 142 06/11/2017 1451   NA 141 04/09/2017 1357   K 3.9 08/21/2017 0520   K 4.2 08/20/2017 0500   K 3.1 (L) 08/19/2017 0758   K 4.8 07/30/2017 1504   K 4.3 06/11/2017 1451   K 4.3 04/09/2017 1357   CL 110 08/21/2017 0520   CL 110 08/20/2017 0500   CL 107 08/19/2017 0758   CO2 24 08/21/2017 0520   CO2 22 08/20/2017 0500   CO2 24 08/19/2017 0758   CO2 20 (L) 07/30/2017 1504   CO2 20 (L) 06/11/2017 1451   CO2 22 04/09/2017 1357   GLUCOSE 106 (H) 08/21/2017 0520   GLUCOSE 98 08/20/2017  0500   GLUCOSE 100 (H) 08/19/2017 0758   GLUCOSE 79 07/30/2017 1504   GLUCOSE 74 06/11/2017 1451   GLUCOSE 90 04/09/2017 1357   BUN 24 (H) 08/21/2017 0520   BUN 16 08/20/2017 0500   BUN 16 08/19/2017 0758   BUN 24.4 07/30/2017 1504   BUN 18.7 06/11/2017 1451   BUN 23.4 04/09/2017 1357   CREATININE 1.21 (H) 08/21/2017 0520   CREATININE 1.03 (H) 08/20/2017 0500   CREATININE 1.09 (H) 08/19/2017 0758   CREATININE 1.2 (H) 07/30/2017 1504   CREATININE 1.2 (H) 06/11/2017 1451   CREATININE 1.0 04/09/2017 1357   CALCIUM 8.6 (L) 08/21/2017 0520   CALCIUM 8.3 (L) 08/20/2017 0500   CALCIUM 8.2 (L) 08/19/2017 0758   CALCIUM 9.1 07/30/2017 1504   CALCIUM 9.6 06/11/2017 1451   CALCIUM 10.2 04/09/2017 1357   GFRNONAA 44 (L) 08/21/2017 0520   GFRNONAA 54 (L) 08/20/2017 0500   GFRNONAA 50 (L) 08/19/2017 0758   GFRAA 51 (L) 08/21/2017 0520   GFRAA >60 08/20/2017 0500   GFRAA 58 (L) 08/19/2017 0758   CBC    Component Value Date/Time   WBC 9.8 08/21/2017 0520   RBC 3.10 (L) 08/21/2017 0520   HGB 8.9 (L) 08/21/2017 0520   HGB 10.6 (L) 07/30/2017 1504   HCT 27.0 (L) 08/21/2017 0520   HCT  33.8 (L) 07/30/2017 1504   PLT 412 (H) 08/21/2017 0520   PLT 203 07/30/2017 1504   MCV 87.1 08/21/2017 0520   MCV 90.9 07/30/2017 1504   MCH 28.7 08/21/2017 0520   MCHC 33.0 08/21/2017 0520   RDW 15.2 08/21/2017 0520   RDW 16.6 (H) 07/30/2017 1504   LYMPHSABS 1.2 08/16/2017 1656   LYMPHSABS 1.3 07/30/2017 1504   MONOABS 1.7 (H) 08/16/2017 1656   MONOABS 0.4 07/30/2017 1504   EOSABS 0.0 08/16/2017 1656   EOSABS 0.1 07/30/2017 1504   BASOSABS 0.0 08/16/2017 1656   BASOSABS 0.0 07/30/2017 1504   HEPATIC Function Panel  Recent Labs  07/30/17 1504 07/30/17 1504 08/16/17 1656  PROT 6.6 6.1 7.1   HEMOGLOBIN A1C No components found for: HGA1C,  MPG CARDIAC ENZYMES Lab Results  Component Value Date   CKTOTAL 129 01/01/2012   CKMB 4.6 (H) 01/01/2012   TROPONINI 0.03 (HH) 06/23/2017   TROPONINI  0.03 (HH) 06/23/2017   TROPONINI 0.03 (HH) 06/23/2017   BNP No results for input(s): PROBNP in the last 8760 hours. TSH  Recent Labs  12/18/16 1056 12/22/16 2008 06/24/17 1046  TSH 0.576 0.481 0.844   CHOLESTEROL  Recent Labs  02/24/17 2028 03/05/17 0304 06/24/17 0545  CHOL 196 174 191    Scheduled Meds: . acyclovir  400 mg Oral BID  . amiodarone  100 mg Oral Daily  . amoxicillin-clavulanate  1 tablet Oral Q12H  . aspirin  81 mg Oral Daily  . atorvastatin  80 mg Oral q1800  . diclofenac sodium  2 g Topical QID  . ferrous sulfate  325 mg Oral Q breakfast  . [START ON 08/22/2017] furosemide  40 mg Oral Daily  . gabapentin  100 mg Oral TID  . pantoprazole  40 mg Oral Daily  . potassium chloride  10 mEq Oral BID  . pyridostigmine  30 mg Oral BID  . ticagrelor  90 mg Oral BID   Continuous Infusions: . sodium chloride     PRN Meds:.acetaminophen **OR** acetaminophen, ondansetron **OR** ondansetron (ZOFRAN) IV, sodium chloride flush, traMADol  Assessment/Plan: Acute lower GI bleed Anemia blood loss Ischemic cardiomyopathy Lower leg DVT ruled out Coronary artery disease Status post stents in LAD and LCx Multiple myeloma Hypokalemia Bilateral feet pain  Check total CK for myalgia Possible adverse effect of statin. Try diclofenac gel over feet.   LOS: 5 days    [name]  MD  08/21/2017, 9:52 AM

## 2017-08-21 NOTE — Discharge Summary (Signed)
[facility]  Physician Discharge Summary   Patient ID: Abigail [name] MRN: [id number] DOB/AGE: 03-06-1946 71 y.o.  Admit date: 08/16/2017 Discharge date: 08/21/2017  PCP: Nolene Ebbs, MD  DISCHARGE DIAGNOSES:  Principal Problem:   GI bleed Active Problems:   Multiple myeloma ([place])   Essential hypertension   Fever   Sepsis ([place])   RECOMMENDATIONS FOR OUTPATIENT FOLLOW UP: 1. Outpatient follow-up with cardiology for blood work next week   DISCHARGE CONDITION: fair  Diet recommendation: As before  [facility] Weights   08/17/17 0219 08/17/17 1525  Weight: 72 kg (158 lb 11.7 oz) 72 kg (158 lb 11.7 oz)    INITIAL HISTORY: 71 year old female with a past medical history of multiple myeloma in remission, history of DVT on Coumadin, chronic kidney disease stage III, coronary artery disease status post drug-eluting stent placement in March and then again in May, history of systolic congestive heart failure with ejection fraction of 40% presented with complains of rectal bleeding.  She was hospitalized for further management.  Consultants: Cardiology: Dr. Doylene Canard  Procedures:  Transthoracic echocardiogram Study Conclusions  - Left ventricle: The cavity size was normal. Systolic function was mildly to moderately reduced. The estimated ejection fraction was in the range of 40% to 45%. Severe hypokinesis of the mid-apicalanterior and apical myocardium; in the distribution of the left anterior descending coronary artery. - Aortic valve: Valve area (Vmax): 1.56 cm^2. - Mitral valve: There was mild regurgitation. - Left atrium: The atrium was moderately dilated. - Right atrium: The atrium was mildly dilated. - Pulmonary arteries: PA peak pressure: 50 mm Hg (S). - Inferior vena cava: The vessel was normal in size. The respirophasic diameter changes were blunted (<50%), consistent with elevated central venous pressure.  Impressions:  - The right ventricular  systolic pressure was increased consistent with moderate pulmonary hypertension.  Lower extremity venous Doppler No DVT noted in either extremity   HOSPITAL COURSE:   Lower GI bleed Most likely diverticular in the setting of supratherapeutic INR.  Appears to have subsided.  Her last colonoscopy was in 2016 which showed 2 polyps and diverticulosis. Usually followed by Dr. Hilarie Fredrickson.  Acute blood loss anemia Patient has been transfused 2 units of PRBC.  She also received fresh frozen plasma and vitamin K due to elevated INR. Hemoglobin has been stable.   Supratherapeutic INR in setting of anticoagulation for DVT INR was greater than 8.  She was given FFP's and vitamin K.  INR improved subsequently.  She will remain off of Coumadin.  Sepsis due to healthcare associated pneumonia Patient was noted to have pneumonia on the chest x-ray and CT scan.  Placed on vancomycin and cefepime. Cultures have been negative.  Symptomatically she has improved.  She will be discharged in a few more days of Augmentin.  History of DVT on warfarin Previous history of lower extremity DVT.  Lower extremity venous Doppler done during this hospitalization does not show any DVT.  There is no need to continue anticoagulation at this time.  History of coronary artery disease with drug-eluting stent placement in March and May Patient has been on Brilinta.  Cardiology consulted.  Aspirin was initiated on 10/30.    Brilinta initiated 10/31.  Patient remains stable from a cardiac standpoint.  She is also noted to be on amiodarone.  She is noted to be bradycardic but stable and asymptomatic.    History of chronic systolic congestive heart failure/hypokalemia/hypomagnesemia EF is noted to be 40-45% based on echocardiogram from May.  Repeat  echocardiogram is as above.  Ejection fraction is about the same.  She was given intravenous Lasix by cardiology and is now on oral Lasix.  Appears to be euvolemic currently.  Pain  is noted.  Discussed with cardiology who states that they will follow this up as outpatient.  Patient is making urine.  They also recommend holding her Lasix for now.  They will address this at follow-up.  Also defer initiation of ACE inhibitor to the outpatient setting since her creatinine has been elevated here.  Electrolytes were replaced.  History of multiple myeloma This is in remission.  History of essential hypertension Stable.    Overall stable.  Patient mentioned lower extremity pain.  On examination no lesions are identified on either leg or foot.  She has good peripheral pulses.  Doppler studies were negative for DVT.  No rashes noted.  She has good range of motion of the ankles and knees and hips bilaterally. Seen again by physical therapy today and has ambulated.  Home health is recommended.  Patient reassured.  Okay for discharge.    PERTINENT LABS:  The results of significant diagnostics from this hospitalization (including imaging, microbiology, ancillary and laboratory) are listed below for reference.    Microbiology: Recent Results (from the past 240 hour(s))  Culture, blood (x 2)     Status: None (Preliminary result)   Collection Time: 08/17/17 12:44 AM  Result Value Ref Range Status   Specimen Description BLOOD BLOOD RIGHT HAND  Final   Special Requests IN PEDIATRIC BOTTLE Blood Culture adequate volume  Final   Culture   Final    NO GROWTH 4 DAYS Performed at [facility], [address], [place], [place] [address]    Report Status PENDING  Incomplete  Culture, blood (x 2)     Status: None (Preliminary result)   Collection Time: 08/17/17 12:44 AM  Result Value Ref Range Status   Specimen Description BLOOD BLOOD LEFT HAND  Final   Special Requests IN PEDIATRIC BOTTLE Blood Culture adequate volume  Final   Culture   Final    NO GROWTH 4 DAYS Performed at [facility], [place] [address] [place], [place] [address]    Report Status PENDING  Incomplete    MRSA PCR Screening     Status: None   Collection Time: 08/17/17  4:08 PM  Result Value Ref Range Status   MRSA by PCR NEGATIVE NEGATIVE Final    Comment:        The GeneXpert MRSA Assay (FDA approved for NASAL specimens only), is one component of a comprehensive MRSA colonization surveillance program. It is not intended to diagnose MRSA infection nor to guide or monitor treatment for MRSA infections.      Labs: Basic Metabolic Panel:  Recent Labs Lab 08/17/17 0550 08/17/17 2100 08/18/17 0450 08/19/17 0548 08/19/17 0758 08/20/17 0500 08/21/17 0520  NA 140 139  --   --  142 140 142  K 4.1 3.7  --   --  3.1* 4.2 3.9  CL 111 112*  --   --  107 110 110  CO2 19* 18*  --   --  '24 22 24  ' GLUCOSE 108* 96  --   --  100* 98 106*  BUN 19 18  --   --  16 16 24*  CREATININE 0.99 0.97 1.03* 1.07* 1.09* 1.03* 1.21*  CALCIUM 8.4* 7.4*  --   --  8.2* 8.3* 8.6*  MG  --   --   --   --   --  1.6*  --    Liver Function Tests:  Recent Labs Lab 08/16/17 1656  AST 22  ALT 10*  ALKPHOS 71  BILITOT 0.9  PROT 7.1  ALBUMIN 3.4*   CBC:  Recent Labs Lab 08/16/17 1656  08/18/17 0450 08/18/17 1149 08/19/17 0758 08/20/17 0500 08/21/17 0520  WBC 11.1*  < > 11.1* 11.2* 8.1 9.2 9.8  NEUTROABS 8.1*  --   --   --   --   --   --   HGB 8.3*  < > 8.6* 8.9* 9.0* 9.0* 8.9*  HCT 26.0*  < > 25.7* 26.4* 26.9* 27.8* 27.0*  MCV 90.3  < > 86.2 85.4 85.1 87.1 87.1  PLT 274  < > 231 254 296 370 412*  < > = values in this interval not displayed. Cardiac Enzymes:  Recent Labs Lab 08/21/17 0520  CKTOTAL 52  CKMB 1.3     IMAGING STUDIES Dg Chest 2 View  Result Date: 08/16/2017 CLINICAL DATA:  Sepsis.  Weakness.  Bloody stool. EXAM: CHEST  2 VIEW COMPARISON:  06/25/2017 FINDINGS: Stable mild cardiomegaly.  Aortic atherosclerosis. Mild infiltrate is seen in the right lower lobe, suspicious for pneumonia. Left lung is clear. No evidence of pneumothorax or pleural effusion. IMPRESSION: Mild  right lower lobe infiltrate, suspicious for pneumonia. Recommend clinical correlation; consider followup PA and lateral chest X-ray following trial of antibiotic therapy to ensure resolution and exclude underlying malignancy. Mild cardiomegaly. Electronically Signed   By: Earle Gell M.D.   On: 08/16/2017 16:32   Ct Abdomen Pelvis W Contrast  Result Date: 08/16/2017 CLINICAL DATA:  Headache fever and bloody stools EXAM: CT ABDOMEN AND PELVIS WITH CONTRAST TECHNIQUE: Multidetector CT imaging of the abdomen and pelvis was performed using the standard protocol following bolus administration of intravenous contrast. CONTRAST:  100 mL Isovue-300 intravenous COMPARISON:  12/08/2016, 11/19/2016, 11/18/2016, 10/27/2016 FINDINGS: Lower chest: Ground-glass density in the right lower lobe consistent with pneumonia. No pleural effusion. Coronary calcification. Borderline cardiomegaly. Hepatobiliary: No calcified gallstones. No focal hepatic abnormality. No biliary dilatation Pancreas: Unremarkable. No pancreatic ductal dilatation or surrounding inflammatory changes. Spleen: Normal in size without focal abnormality. Adrenals/Urinary Tract: Adrenal glands are within normal limits. Kidneys show no hydronephrosis. Subcentimeter hypodense lesions, too small to further characterize. Bilateral renal cysts. Intermediate density right lower pole lesion demonstrated to represent a cyst on prior MRI, no significant change in size. The bladder is unremarkable Stomach/Bowel: Stomach is nonenlarged. No dilated small bowel. No colon wall thickening. Sigmoid colon diverticula without acute inflammation Vascular/Lymphatic: Extensive atherosclerotic calcification. No aneurysmal dilatation. Bilateral renal artery stents. No significantly enlarged abdominal or pelvic lymph nodes Reproductive: Status post hysterectomy. No adnexal masses. Other: Negative for free air or free fluid Musculoskeletal: Possible old sacrococcygeal fracture. Stable  retrolisthesis L1 on L2 and T12 on L1. Lucent lesion in T12 now contains central calcification. IMPRESSION: 1. Ground-glass opacity in the right lower lobe is suspicious for pneumonia. Imaging follow-up to resolution is recommended 2. Negative for bowel obstruction, bowel wall thickening or colitis. Scattered sigmoid colon diverticula without acute inflammation 3. Stable bilateral renal lesions 4. Stable size of 12 mm lucent lesion within the right aspect of T12 vertebral body, now with central calcifications. This was thought to possibly relate to history of myeloma. Electronically Signed   By: Donavan Foil M.D.   On: 08/16/2017 20:14    DISCHARGE EXAMINATION: Vitals:   08/20/17 1711 08/20/17 2044 08/21/17 0427 08/21/17 1341  BP: 115/71 (!) 108/58 105/61 (!) 123/51  Pulse: 80 79 (!) 53 (!) 45  Resp: 20 20 (!) 21 18  Temp: 97.6 F (36.4 C) 99.5 F (37.5 C) 98.9 F (37.2 C) 98.1 F (36.7 C)  TempSrc: Oral Oral Oral Oral  SpO2: 99% 100% 98% 100%  Weight:      Height:       General appearance: alert, cooperative, appears stated age and no distress Resp: clear to auscultation bilaterally Cardio: regular rate and rhythm, S1, S2 normal, no murmur, click, rub or gallop GI: soft, non-tender; bowel sounds normal; no masses,  no organomegaly  DISPOSITION: Home with home health.  Patient to live with her sister for a few days.  Discharge Instructions    (HEART FAILURE PATIENTS) Call MD:  Anytime you have any of the following symptoms: 1) 3 pound weight gain in 24 hours or 5 pounds in 1 week 2) shortness of breath, with or without a dry hacking cough 3) swelling in the hands, feet or stomach 4) if you have to sleep on extra pillows at night in order to breathe.    Complete by:  As directed    Call MD for:  extreme fatigue    Complete by:  As directed    Call MD for:  persistant dizziness or light-headedness    Complete by:  As directed    Call MD for:  persistant nausea and vomiting    Complete  by:  As directed    Call MD for:  severe uncontrolled pain    Complete by:  As directed    Call MD for:  temperature >100.4    Complete by:  As directed    Diet - low sodium heart healthy    Complete by:  As directed    Discharge instructions    Complete by:  As directed    Please be sure to follow-up with Dr. Doylene Canard next week.  Seek attention immediately if you notice blood in your stool.  You were cared for by a hospitalist during your hospital stay. If you have any questions about your discharge medications or the care you received while you were in the hospital after you are discharged, you can call the unit and asked to speak with the hospitalist on call if the hospitalist that took care of you is not available. Once you are discharged, your primary care physician will handle any further medical issues. Please note that NO REFILLS for any discharge medications will be authorized once you are discharged, as it is imperative that you return to your primary care physician (or establish a relationship with a primary care physician if you do not have one) for your aftercare needs so that they can reassess your need for medications and monitor your lab values. If you do not have a primary care physician, you can call [phone] for a physician referral.   Increase activity slowly    Complete by:  As directed       ALLERGIES:  Allergies  Allergen Reactions  . Sulfa Antibiotics Rash     Current Discharge Medication List    START taking these medications   Details  amoxicillin-clavulanate (AUGMENTIN) 875-125 MG tablet Take 1 tablet by mouth every 12 (twelve) hours. Qty: 10 tablet, Refills: 0    aspirin EC 81 MG tablet Take 1 tablet (81 mg total) by mouth daily. Qty: 30 tablet, Refills: 2    diclofenac sodium (VOLTAREN) 1 % GEL Apply 2 g topically 4 (four) times daily. Qty: 1 Tube, Refills:  0    gabapentin (NEURONTIN) 100 MG capsule Take 1 capsule (100 mg total) by mouth 3 (three)  times daily. Qty: 90 capsule, Refills: 0    traMADol (ULTRAM) 50 MG tablet Take 1 tablet (50 mg total) by mouth every 6 (six) hours as needed for moderate pain. Qty: 15 tablet, Refills: 0      CONTINUE these medications which have NOT CHANGED   Details  acetaminophen (TYLENOL) 500 MG tablet Take 1,000 mg by mouth every 6 (six) hours as needed for mild pain or headache.    acyclovir (ZOVIRAX) 400 MG tablet TAKE 1 TABLET (400 MG TOTAL) BY MOUTH 2 (TWO) TIMES DAILY. Qty: 60 tablet, Refills: 2    amiodarone (PACERONE) 200 MG tablet Take 0.5 tablets (100 mg total) by mouth daily.    Ascorbic Acid (VITAMIN C) 1000 MG tablet Take 1,000 mg by mouth daily.     atorvastatin (LIPITOR) 80 MG tablet Take 1 tablet (80 mg total) by mouth daily at 6 PM. Qty: 30 tablet, Refills: 3    calcium carbonate (OS-CAL - DOSED IN MG OF ELEMENTAL CALCIUM) 1250 (500 Ca) MG tablet Take 1 tablet by mouth daily with breakfast.    Cholecalciferol (VITAMIN D PO) Take 1 capsule by mouth daily.    ferrous sulfate 325 (65 FE) MG tablet Take 1 tablet (325 mg total) by mouth daily with breakfast. Qty: 30 tablet, Refills: 3    nitroGLYCERIN (NITROSTAT) 0.4 MG SL tablet Place 0.4 mg under the tongue every 5 (five) minutes as needed for chest pain. Refills: 1    pantoprazole (PROTONIX) 40 MG tablet Take 1 tablet (40 mg total) by mouth daily. Qty: 30 tablet, Refills: 2    pyridostigmine (MESTINON) 60 MG tablet Take 0.5 tablets (30 mg total) by mouth 2 (two) times daily. Qty: 30 tablet, Refills: 3    ticagrelor (BRILINTA) 90 MG TABS tablet Take 1 tablet (90 mg total) by mouth 2 (two) times daily. Qty: 60 tablet, Refills: 3      STOP taking these medications     furosemide (LASIX) 40 MG tablet      potassium chloride (K-DUR,KLOR-CON) 10 MEQ tablet      warfarin (COUMADIN) 5 MG tablet          Follow-up Information    [name], MD. Schedule an appointment as soon as possible for a visit in 1 week(s).     Specialty:  Cardiology Contact information: [address] [place] [address] [phone]           TOTAL DISCHARGE TIME: 79 minutes  [facility] Pager [phone]  08/21/2017, 3:19 PM

## 2017-08-21 NOTE — Progress Notes (Addendum)
Spoke with pt concerning Abigail [name] vs SNF. Pt states she is going home with HH. Pt selected [facility]. Referral given. Will need face to face and orders signed.

## 2017-08-21 NOTE — Progress Notes (Signed)
Physical Therapy Treatment Patient Details Name: Abigail [name] MRN: [id number] DOB: [birth year] Today's Date: 08/21/2017    History of Present Illness P Abigail [name] is a 71 y.o. female with medical history significant of multiple myeloma; DVT on COumadin; CAD; HTN; HLD; CKD stage 3 with h/o B RAS s/p stenting; and chronic anemia presenting because she started passing a lot of blood in her stool. Hgb 8.7, INR >8.    PT Comments    RN asked PT to see pt today as she was unable to walk with MD earlier and had concerns for home D/C. Patient performed bed mobility with Mod Indep . Ambulated to bathroom with RW, without shoes, c/o pain B foot pain and present with walking on her heels.   Applied patients shoes, foot pain relieved and ambulated 150 ft with RW without pain at [employer]. Ambulated 50 ft with no AD required Min Assist, occasional support with wall rail (furniture walker).     Follow Up Recommendations  Home health PT Assist from sister   Equipment Recommendations  None recommended by PT    Recommendations for Other Services       Precautions / Restrictions Precautions Precautions: Fall Precaution Comments: monitor BP Restrictions Weight Bearing Restrictions: No    Mobility  Bed Mobility Overal bed mobility: Modified Independent                Transfers Overall transfer level: Needs assistance Equipment used: Rolling walker (2 wheeled);None Transfers: Sit to/from Stand Sit to Stand: Min guard;Supervision         General transfer comment:  25% VC's for hand placement with sit to stand and 50% VC's turn completion using walker   Ambulation/Gait Ambulation/Gait assistance: Min assist;Min guard Ambulation Distance (Feet): 200 Feet Assistive device: Rolling walker (2 wheeled) Gait Pattern/deviations: Step-through pattern;Decreased step length - left;Decreased step length - right Gait velocity: decreased Gait velocity interpretation: Below normal speed for  age/gender General Gait Details: Ambulated 150 ft with RW MinGuard, ambulated 50 feet no AD Min Assist occasional support on wall rail (furniture walker)   Stairs            Wheelchair Mobility    Modified Rankin (Stroke Patients Only)       Balance                                            Cognition Arousal/Alertness: Awake/alert Behavior During Therapy: WFL for tasks assessed/performed Overall Cognitive Status: Within Functional Limits for tasks assessed                                        Exercises      General Comments        Pertinent Vitals/Pain Pain Assessment: No/denies pain Pain Score: 0-No pain    Home Living                      Prior Function            PT Goals (current goals can now be found in the care plan section)      Frequency    Min 3X/week      PT Plan Current plan remains appropriate    Co-evaluation  AM-PAC PT "6 Clicks" Daily Activity  Outcome Measure  Difficulty turning over in bed (including adjusting bedclothes, sheets and blankets)?: None Difficulty moving from lying on back to sitting on the side of the bed? : None Difficulty sitting down on and standing up from a chair with arms (e.g., wheelchair, bedside commode, etc,.)?: A Little Help needed moving to and from a bed to chair (including a wheelchair)?: A Little Help needed walking in hospital room?: A Little Help needed climbing 3-5 steps with a railing? : A Lot 6 Click Score: 19    End of Session Equipment Utilized During Treatment: Gait belt Activity Tolerance: Patient tolerated treatment well;No increased pain Patient left: in chair;with call bell/phone within reach;with bed alarm set Nurse Communication: Mobility status PT Visit Diagnosis: Difficulty in walking, not elsewhere classified (R26.2);Pain     Time: 6734-1937 PT Time Calculation (min) (ACUTE ONLY): 24 min  Charges:                        G Codes:       Almond Lint, SPTA [facility] [place]  PTA [facility]  Acute  Rehab Pager      [phone]

## 2017-08-22 LAB — CULTURE, BLOOD (ROUTINE X 2)
CULTURE: NO GROWTH
Culture: NO GROWTH
SPECIAL REQUESTS: ADEQUATE
Special Requests: ADEQUATE

## 2017-08-25 ENCOUNTER — Telehealth: Payer: Self-pay | Admitting: Oncology

## 2017-08-25 NOTE — Telephone Encounter (Signed)
Faxed office notes to [facility] risk adjustment

## 2017-09-03 ENCOUNTER — Inpatient Hospital Stay (HOSPITAL_COMMUNITY): Payer: Medicare HMO | Admitting: Anesthesiology

## 2017-09-03 ENCOUNTER — Inpatient Hospital Stay (HOSPITAL_COMMUNITY)
Admission: EM | Admit: 2017-09-03 | Discharge: 2017-09-05 | DRG: 377 | Disposition: A | Discharge status: Home Health | Payer: Medicare HMO | Attending: Internal Medicine | Admitting: Internal Medicine

## 2017-09-03 ENCOUNTER — Inpatient Hospital Stay (HOSPITAL_COMMUNITY): Payer: Medicare HMO

## 2017-09-03 ENCOUNTER — Encounter (HOSPITAL_COMMUNITY): Payer: Self-pay

## 2017-09-03 ENCOUNTER — Encounter (HOSPITAL_COMMUNITY)
Admission: EM | Disposition: A | Discharge status: Home Health | Payer: Self-pay | Source: Home / Self Care | Attending: Internal Medicine

## 2017-09-03 ENCOUNTER — Emergency Department (HOSPITAL_COMMUNITY): Payer: Medicare HMO

## 2017-09-03 ENCOUNTER — Other Ambulatory Visit: Payer: Self-pay

## 2017-09-03 DIAGNOSIS — K449 Diaphragmatic hernia without obstruction or gangrene: Secondary | ICD-10-CM | POA: Diagnosis present

## 2017-09-03 DIAGNOSIS — I4891 Unspecified atrial fibrillation: Secondary | ICD-10-CM | POA: Diagnosis present

## 2017-09-03 DIAGNOSIS — E785 Hyperlipidemia, unspecified: Secondary | ICD-10-CM | POA: Diagnosis present

## 2017-09-03 DIAGNOSIS — Z95820 Peripheral vascular angioplasty status with implants and grafts: Secondary | ICD-10-CM | POA: Diagnosis not present

## 2017-09-03 DIAGNOSIS — I2510 Atherosclerotic heart disease of native coronary artery without angina pectoris: Secondary | ICD-10-CM | POA: Diagnosis not present

## 2017-09-03 DIAGNOSIS — N183 Chronic kidney disease, stage 3 unspecified: Secondary | ICD-10-CM | POA: Diagnosis present

## 2017-09-03 DIAGNOSIS — F329 Major depressive disorder, single episode, unspecified: Secondary | ICD-10-CM | POA: Diagnosis present

## 2017-09-03 DIAGNOSIS — I25119 Atherosclerotic heart disease of native coronary artery with unspecified angina pectoris: Secondary | ICD-10-CM | POA: Diagnosis present

## 2017-09-03 DIAGNOSIS — C9001 Multiple myeloma in remission: Secondary | ICD-10-CM | POA: Diagnosis present

## 2017-09-03 DIAGNOSIS — J810 Acute pulmonary edema: Secondary | ICD-10-CM | POA: Diagnosis not present

## 2017-09-03 DIAGNOSIS — I5023 Acute on chronic systolic (congestive) heart failure: Secondary | ICD-10-CM | POA: Diagnosis present

## 2017-09-03 DIAGNOSIS — I272 Pulmonary hypertension, unspecified: Secondary | ICD-10-CM | POA: Diagnosis present

## 2017-09-03 DIAGNOSIS — F419 Anxiety disorder, unspecified: Secondary | ICD-10-CM | POA: Diagnosis present

## 2017-09-03 DIAGNOSIS — Z955 Presence of coronary angioplasty implant and graft: Secondary | ICD-10-CM

## 2017-09-03 DIAGNOSIS — D62 Acute posthemorrhagic anemia: Secondary | ICD-10-CM | POA: Diagnosis present

## 2017-09-03 DIAGNOSIS — I252 Old myocardial infarction: Secondary | ICD-10-CM

## 2017-09-03 DIAGNOSIS — G629 Polyneuropathy, unspecified: Secondary | ICD-10-CM | POA: Diagnosis present

## 2017-09-03 DIAGNOSIS — K5730 Diverticulosis of large intestine without perforation or abscess without bleeding: Secondary | ICD-10-CM | POA: Diagnosis not present

## 2017-09-03 DIAGNOSIS — I739 Peripheral vascular disease, unspecified: Secondary | ICD-10-CM | POA: Diagnosis present

## 2017-09-03 DIAGNOSIS — K922 Gastrointestinal hemorrhage, unspecified: Secondary | ICD-10-CM | POA: Diagnosis not present

## 2017-09-03 DIAGNOSIS — D500 Iron deficiency anemia secondary to blood loss (chronic): Secondary | ICD-10-CM | POA: Diagnosis present

## 2017-09-03 DIAGNOSIS — Z87891 Personal history of nicotine dependence: Secondary | ICD-10-CM

## 2017-09-03 DIAGNOSIS — K921 Melena: Secondary | ICD-10-CM | POA: Diagnosis present

## 2017-09-03 DIAGNOSIS — R079 Chest pain, unspecified: Secondary | ICD-10-CM

## 2017-09-03 DIAGNOSIS — Z7982 Long term (current) use of aspirin: Secondary | ICD-10-CM

## 2017-09-03 DIAGNOSIS — K5731 Diverticulosis of large intestine without perforation or abscess with bleeding: Secondary | ICD-10-CM | POA: Diagnosis present

## 2017-09-03 DIAGNOSIS — I130 Hypertensive heart and chronic kidney disease with heart failure and stage 1 through stage 4 chronic kidney disease, or unspecified chronic kidney disease: Secondary | ICD-10-CM | POA: Diagnosis present

## 2017-09-03 DIAGNOSIS — Z882 Allergy status to sulfonamides status: Secondary | ICD-10-CM

## 2017-09-03 DIAGNOSIS — D649 Anemia, unspecified: Secondary | ICD-10-CM | POA: Diagnosis present

## 2017-09-03 DIAGNOSIS — I5021 Acute systolic (congestive) heart failure: Secondary | ICD-10-CM | POA: Diagnosis not present

## 2017-09-03 DIAGNOSIS — K222 Esophageal obstruction: Secondary | ICD-10-CM | POA: Diagnosis not present

## 2017-09-03 DIAGNOSIS — Z86718 Personal history of other venous thrombosis and embolism: Secondary | ICD-10-CM | POA: Diagnosis not present

## 2017-09-03 HISTORY — PX: ESOPHAGOGASTRODUODENOSCOPY: SHX5428

## 2017-09-03 LAB — I-STAT TROPONIN, ED: Troponin i, poc: 0.01 ng/mL (ref 0.00–0.08)

## 2017-09-03 LAB — POC OCCULT BLOOD, ED: Fecal Occult Bld: POSITIVE — AB

## 2017-09-03 LAB — BASIC METABOLIC PANEL
Anion gap: 7 (ref 5–15)
BUN: 10 mg/dL (ref 6–20)
CHLORIDE: 114 mmol/L — AB (ref 101–111)
CO2: 20 mmol/L — AB (ref 22–32)
CREATININE: 1.01 mg/dL — AB (ref 0.44–1.00)
Calcium: 8.3 mg/dL — ABNORMAL LOW (ref 8.9–10.3)
GFR calc Af Amer: >60 mL/min (ref >60)
GFR calc non Af Amer: 55 mL/min — ABNORMAL LOW (ref >60)
GLUCOSE: 99 mg/dL (ref 65–99)
POTASSIUM: 3.8 mmol/L (ref 3.5–5.1)
SODIUM: 141 mmol/L (ref 135–145)

## 2017-09-03 LAB — BRAIN NATRIURETIC PEPTIDE: B NATRIURETIC PEPTIDE 5: 1024.1 pg/mL — AB (ref 0.0–100.0)

## 2017-09-03 LAB — CBC
HEMATOCRIT: 18.8 % — AB (ref 36.0–46.0)
Hemoglobin: 5.9 g/dL — CL (ref 12.0–15.0)
MCH: 29.5 pg (ref 26.0–34.0)
MCHC: 31.4 g/dL (ref 30.0–36.0)
MCV: 94 fL (ref 78.0–100.0)
Platelets: 235 10*3/uL (ref 150–400)
RBC: 2 MIL/uL — ABNORMAL LOW (ref 3.87–5.11)
RDW: 18.5 % — AB (ref 11.5–15.5)
WBC: 8.1 10*3/uL (ref 4.0–10.5)

## 2017-09-03 LAB — POCT I-STAT 4, (NA,K, GLUC, HGB,HCT)
Glucose, Bld: 91 mg/dL (ref 65–99)
HEMATOCRIT: 29 % — AB (ref 36.0–46.0)
HEMOGLOBIN: 9.9 g/dL — AB (ref 12.0–15.0)
Potassium: 4 mmol/L (ref 3.5–5.1)
SODIUM: 144 mmol/L (ref 135–145)

## 2017-09-03 LAB — MRSA PCR SCREENING: MRSA by PCR: NEGATIVE

## 2017-09-03 LAB — PROTIME-INR
INR: 1.04
Prothrombin Time: 13.5 seconds (ref 11.4–15.2)

## 2017-09-03 LAB — PREPARE RBC (CROSSMATCH)

## 2017-09-03 LAB — TROPONIN I: Troponin I: <0.03 ng/mL (ref <0.03)

## 2017-09-03 LAB — APTT: APTT: 31 s (ref 24–36)

## 2017-09-03 SURGERY — EGD (ESOPHAGOGASTRODUODENOSCOPY)
Anesthesia: Monitor Anesthesia Care

## 2017-09-03 MED ORDER — SODIUM CHLORIDE 0.9 % IV SOLN
8.0000 mg/h | INTRAVENOUS | Status: DC
  Administered 2017-09-03: 8 mg/h via INTRAVENOUS
  Filled 2017-09-03 (×2): qty 80

## 2017-09-03 MED ORDER — FUROSEMIDE 10 MG/ML IJ SOLN
40.0000 mg | Freq: Two times a day (BID) | INTRAMUSCULAR | Status: DC
  Administered 2017-09-04 – 2017-09-05 (×3): 40 mg via INTRAVENOUS
  Filled 2017-09-03 (×3): qty 4

## 2017-09-03 MED ORDER — PANTOPRAZOLE SODIUM 40 MG IV SOLR: 40.0000 mg | Freq: Two times a day (BID) | INTRAVENOUS | Status: DC

## 2017-09-03 MED ORDER — PYRIDOSTIGMINE BROMIDE 60 MG PO TABS
30.0000 mg | ORAL_TABLET | Freq: Two times a day (BID) | ORAL | Status: DC
  Administered 2017-09-04 – 2017-09-05 (×4): 30 mg via ORAL
  Filled 2017-09-03 (×5): qty 0.5

## 2017-09-03 MED ORDER — PROPOFOL 500 MG/50ML IV EMUL
INTRAVENOUS | Status: DC | PRN
  Administered 2017-09-03: 50 ug/kg/min via INTRAVENOUS

## 2017-09-03 MED ORDER — METOCLOPRAMIDE HCL 5 MG/ML IJ SOLN
10.0000 mg | Freq: Once | INTRAMUSCULAR | Status: AC
  Administered 2017-09-04: 10 mg via INTRAVENOUS
  Filled 2017-09-03: qty 2

## 2017-09-03 MED ORDER — AMIODARONE HCL 100 MG PO TABS
100.0000 mg | ORAL_TABLET | Freq: Every day | ORAL | Status: DC
  Administered 2017-09-04 – 2017-09-05 (×2): 100 mg via ORAL
  Filled 2017-09-03 (×2): qty 1

## 2017-09-03 MED ORDER — METOCLOPRAMIDE HCL 5 MG/ML IJ SOLN
10.0000 mg | Freq: Once | INTRAMUSCULAR | Status: AC
  Administered 2017-09-03: 10 mg via INTRAVENOUS
  Filled 2017-09-03: qty 2

## 2017-09-03 MED ORDER — ONDANSETRON HCL 4 MG PO TABS
4.0000 mg | ORAL_TABLET | Freq: Four times a day (QID) | ORAL | Status: DC | PRN
Start: 2017-09-03 — End: 2017-09-03

## 2017-09-03 MED ORDER — PROPOFOL 10 MG/ML IV BOLUS
INTRAVENOUS | Status: DC | PRN
  Administered 2017-09-03: 20 mg via INTRAVENOUS
  Administered 2017-09-03: 40 mg via INTRAVENOUS

## 2017-09-03 MED ORDER — NITROGLYCERIN 0.4 MG SL SUBL: 0.4000 mg | SUBLINGUAL_TABLET | SUBLINGUAL | Status: DC | PRN

## 2017-09-03 MED ORDER — PANTOPRAZOLE SODIUM 40 MG IV SOLR
80.0000 mg | Freq: Once | INTRAVENOUS | Status: AC
  Administered 2017-09-03: 11:00:00 80 mg via INTRAVENOUS
  Filled 2017-09-03: qty 80

## 2017-09-03 MED ORDER — PANTOPRAZOLE SODIUM 40 MG PO TBEC
40.0000 mg | DELAYED_RELEASE_TABLET | Freq: Every day | ORAL | Status: DC
  Administered 2017-09-05: 40 mg via ORAL
  Filled 2017-09-03: qty 1

## 2017-09-03 MED ORDER — BUTAMBEN-TETRACAINE-BENZOCAINE 2-2-14 % EX AERO
INHALATION_SPRAY | CUTANEOUS | Status: DC | PRN
  Administered 2017-09-03: 1 via TOPICAL

## 2017-09-03 MED ORDER — BISACODYL 5 MG PO TBEC
5.0000 mg | DELAYED_RELEASE_TABLET | Freq: Once | ORAL | Status: AC
  Administered 2017-09-03: 5 mg via ORAL
  Filled 2017-09-03: qty 1

## 2017-09-03 MED ORDER — SODIUM CHLORIDE 0.9% FLUSH: 10.0000 mL | INTRAVENOUS | Status: DC | PRN

## 2017-09-03 MED ORDER — SODIUM CHLORIDE 0.9 % IV SOLN
INTRAVENOUS | Status: DC | PRN
  Administered 2017-09-03: 15:00:00 via INTRAVENOUS

## 2017-09-03 MED ORDER — ACETAMINOPHEN 325 MG PO TABS
650.0000 mg | ORAL_TABLET | Freq: Four times a day (QID) | ORAL | Status: DC | PRN
  Administered 2017-09-05: 650 mg via ORAL
  Filled 2017-09-03: qty 2

## 2017-09-03 MED ORDER — ONDANSETRON HCL 4 MG/2ML IJ SOLN: 4.0000 mg | Freq: Four times a day (QID) | INTRAMUSCULAR | Status: DC | PRN

## 2017-09-03 MED ORDER — FUROSEMIDE 10 MG/ML IJ SOLN
40.0000 mg | Freq: Once | INTRAMUSCULAR | Status: AC
  Administered 2017-09-03: 40 mg via INTRAVENOUS
  Filled 2017-09-03: qty 4

## 2017-09-03 MED ORDER — HYDRALAZINE HCL 20 MG/ML IJ SOLN: 5.0000 mg | INTRAMUSCULAR | Status: DC | PRN

## 2017-09-03 MED ORDER — MORPHINE SULFATE (PF) 4 MG/ML IV SOLN
2.0000 mg | INTRAVENOUS | Status: DC | PRN
  Administered 2017-09-03 (×2): 2 mg via INTRAVENOUS
  Filled 2017-09-03 (×2): qty 1

## 2017-09-03 MED ORDER — PEG-KCL-NACL-NASULF-NA ASC-C 100 G PO SOLR: 1.0000 | Freq: Once | ORAL | Status: DC

## 2017-09-03 MED ORDER — ACETAMINOPHEN 650 MG RE SUPP: 650.0000 mg | Freq: Four times a day (QID) | RECTAL | Status: DC | PRN

## 2017-09-03 MED ORDER — SODIUM CHLORIDE 0.9% FLUSH
10.0000 mL | Freq: Two times a day (BID) | INTRAVENOUS | Status: DC
  Administered 2017-09-04: 20 mL
  Administered 2017-09-04: 10 mL
  Administered 2017-09-05: 20 mL

## 2017-09-03 MED ORDER — GABAPENTIN 100 MG PO CAPS
100.0000 mg | ORAL_CAPSULE | Freq: Three times a day (TID) | ORAL | Status: DC
Start: 2017-09-03 — End: 2017-09-05
  Administered 2017-09-04 – 2017-09-05 (×5): 100 mg via ORAL
  Filled 2017-09-03 (×6): qty 1

## 2017-09-03 MED ORDER — IOPAMIDOL (ISOVUE-370) INJECTION 76%
100.0000 mL | Freq: Once | INTRAVENOUS | Status: AC | PRN
  Administered 2017-09-03: 100 mL via INTRAVENOUS

## 2017-09-03 MED ORDER — PEG-KCL-NACL-NASULF-NA ASC-C 100 G PO SOLR
0.5000 | Freq: Once | ORAL | Status: AC
  Administered 2017-09-04: 100 g via ORAL
  Filled 2017-09-03: qty 1

## 2017-09-03 MED ORDER — PEG-KCL-NACL-NASULF-NA ASC-C 100 G PO SOLR
0.5000 | Freq: Once | ORAL | Status: AC
Start: 2017-09-03 — End: 2017-09-03
  Administered 2017-09-03: 100 g via ORAL
  Filled 2017-09-03: qty 1

## 2017-09-03 MED ORDER — FENTANYL CITRATE (PF) 100 MCG/2ML IJ SOLN
50.0000 ug | Freq: Once | INTRAMUSCULAR | Status: AC
  Administered 2017-09-03: 50 ug via INTRAVENOUS
  Filled 2017-09-03: qty 2

## 2017-09-03 MED ORDER — SODIUM CHLORIDE 0.9 % IV SOLN
10.0000 mL/h | Freq: Once | INTRAVENOUS | Status: AC
  Administered 2017-09-03: 10 mL/h via INTRAVENOUS

## 2017-09-03 NOTE — ED Notes (Addendum)
Blood transfusion complete

## 2017-09-03 NOTE — ED Notes (Signed)
Blood administration start now - rate 120 ml/hr

## 2017-09-03 NOTE — Progress Notes (Addendum)
This is a no charge note  Pending admission per Dr. Leonides Schanz  71 year old lady with a past medical history of CAD, stent placement on brilinta and aspirin, hypertension, hyperlipidemia, GERD, DVT, PAF not on anticoagulants, sCHF with EF 40-45 percent, gastric ulcer disease, duodenitis, GI bleeding, CKD-3, who presents with chest pain and shortness of breath. Chest pain has been going on intermittently for 2 weeks, is pleuritic pleuritic per ED physician.  Pt used be on coumadin for DVT and PAF, which was discontinued due to GIB.  Patient was found to have positive FOBT, hemoglobin dropped from 8.9 on 08/21/17 to 5.9, negative troponin, BNP 1024, INR 1.04, PTT 31, electrolytes and renal function okay, bradycardia, no tachypnea, oxygen saturation 99% on room air, temperature normal, blood pressure 153/67. Chest x-ray showed vascular congestion. Patient is admitted to stepdown as inpatient. 3 unit of blood were ordered. 40 mg Lasix will be given. CTA of chest is pening for ruling out PE. Need to call GI in AM ([facility], Dr. Fuller Plan did EDG on 11/18/16).   Ivor Costa, MD  [facility] Pager [phone]  If 7PM-7AM, please contact night-coverage [email or web address] Password [facility] 09/03/2017, 6:41 AM

## 2017-09-03 NOTE — ED Notes (Signed)
Autumn [phone] from Endo

## 2017-09-03 NOTE — ED Notes (Signed)
Attempted IV without success.

## 2017-09-03 NOTE — Anesthesia Preprocedure Evaluation (Signed)
Anesthesia Evaluation  Patient identified by MRN, date of birth, ID band Patient awake    Reviewed: Allergy & Precautions, NPO status , Patient's Chart, lab work & pertinent test results  Airway Mallampati: II  TM Distance: >3 FB     Dental   Pulmonary shortness of breath, former smoker,    breath sounds clear to auscultation       Cardiovascular hypertension, + CAD, + Past MI and + Peripheral Vascular Disease  + Valvular Problems/Murmurs  Rhythm:Regular Rate:Normal     Neuro/Psych    GI/Hepatic PUD, GERD  ,  Endo/Other    Renal/GU Renal disease     Musculoskeletal   Abdominal   Peds  Hematology  (+) anemia ,   Anesthesia Other Findings   Reproductive/Obstetrics                             Anesthesia Physical Anesthesia Plan  ASA: IV  Anesthesia Plan: MAC   Post-op Pain Management:    Induction: Intravenous  PONV Risk Score and Plan: 2 and Treatment may vary due to age or medical condition  Airway Management Planned: Simple Face Mask  Additional Equipment:   Intra-op Plan:   Post-operative Plan:   Informed Consent: I have reviewed the patients History and Physical, chart, labs and discussed the procedure including the risks, benefits and alternatives for the proposed anesthesia with the patient or authorized representative who has indicated his/her understanding and acceptance.   Dental advisory given  Plan Discussed with: CRNA and Anesthesiologist  Anesthesia Plan Comments:         Anesthesia Quick Evaluation

## 2017-09-03 NOTE — Transfer of Care (Signed)
Immediate Anesthesia Transfer of Care Note  Patient: Abigail [name]  Procedure(s) Performed: ESOPHAGOGASTRODUODENOSCOPY (EGD) (N/A )  Patient Location: PACU  Anesthesia Type:MAC  Level of Consciousness: awake, sedated and patient cooperative  Airway & Oxygen Therapy: Patient Spontanous Breathing and Patient connected to nasal cannula oxygen  Post-op Assessment: Report given to RN, Post -op Vital signs reviewed and stable, Patient moving all extremities and Patient moving all extremities X 4  Post vital signs: Reviewed and stable  Last Vitals:  Vitals:   09/03/17 1500 09/03/17 1538  BP: (!) 177/80 (!) 182/80  Pulse: (!) 57 63  Resp: 15 (!) 27  Temp: 37.3 C 37.3 C  SpO2: 99% 99%    Last Pain:  Vitals:   09/03/17 1538  TempSrc: Oral  PainSc:          Complications: No apparent anesthesia complications

## 2017-09-03 NOTE — Progress Notes (Signed)
Peripherally Inserted Central Catheter/Midline Placement  The IV Nurse has discussed with the patient and/or persons authorized to consent for the patient, the purpose of this procedure and the potential benefits and risks involved with this procedure.  The benefits include less needle sticks, lab draws from the catheter, and the patient may be discharged home with the catheter. Risks include, but not limited to, infection, bleeding, blood clot (thrombus formation), and puncture of an artery; nerve damage and irregular heartbeat and possibility to perform a PICC exchange if needed/ordered by physician.  Alternatives to this procedure were also discussed.  Bard Power PICC patient education guide, fact sheet on infection prevention and patient information card has been provided to patient /or left at bedside.    PICC/Midline Placement Documentation  PICC Double Lumen 09/03/17 PICC Right Brachial 39 cm 1 cm (Active)  Indication for Insertion or Continuance of Line Limited venous access - need for IV therapy >5 days (PICC only) 09/03/2017 10:25 AM  Exposed Catheter (cm) 1 cm 09/03/2017 10:25 AM  Site Assessment Clean;Dry;Intact 09/03/2017 10:25 AM  Lumen #1 Status Flushed;Saline locked;Blood return noted 09/03/2017 10:25 AM  Lumen #2 Status Flushed;Saline locked;Blood return noted 09/03/2017 10:25 AM  Dressing Type Transparent;Securing device 09/03/2017 10:25 AM  Dressing Status Clean;Dry;Intact;Antimicrobial disc in place 09/03/2017 10:25 AM  Dressing Change Due 09/10/17 09/03/2017 10:25 AM       Frances Maywood 09/03/2017, 10:28 AM

## 2017-09-03 NOTE — ED Notes (Signed)
Blood paused. IV team at bedside

## 2017-09-03 NOTE — Interval H&P Note (Signed)
History and Physical Interval Note:  09/03/2017 3:16 PM  Abigail [name]  has presented today for surgery, with the diagnosis of GI bleed with dark red bloody stools and recurrent, transfusion requiring, anemia  The various methods of treatment have been discussed with the patient and family. After consideration of risks, benefits and other options for treatment, the patient has consented to  Procedure(s): ESOPHAGOGASTRODUODENOSCOPY (EGD) (N/A) as a surgical intervention .  The patient's history has been reviewed, patient examined, no change in status, stable for surgery.  I have reviewed the patient's chart and labs.  Questions were answered to the patient's satisfaction.    The patient has had 3 units of PRBCs and remains hemodynamically stable.  Nelida Meuse III

## 2017-09-03 NOTE — Anesthesia Procedure Notes (Signed)
Procedure Name: MAC Date/Time: 09/03/2017 3:40 PM Performed by: Izora Gala, CRNA Pre-anesthesia Checklist: Patient identified, Emergency Drugs available, Suction available and Patient being monitored Patient Re-evaluated:Patient Re-evaluated prior to induction Oxygen Delivery Method: Nasal cannula Preoxygenation: Pre-oxygenation with 100% oxygen Induction Type: IV induction Placement Confirmation: positive ETCO2 Dental Injury: Teeth and Oropharynx as per pre-operative assessment

## 2017-09-03 NOTE — ED Triage Notes (Signed)
Pt coming from home. Pt states chest pain has been on and off for two weeks since discharged from the hospital. Pt states tonight chest pain was 8/10 pt took 3 nitro at home w/o relief. Pt is axo x4 and pain level is a 4/10

## 2017-09-03 NOTE — Op Note (Signed)
[facility] Patient Name: Abigail [name] Procedure Date : 09/03/2017 MRN: [id number] Attending MD: Estill Cotta. Loletha Carrow , MD Date of Birth: 1946/10/05 CSN: [id number] Age: 71 Admit Type: Outpatient Procedure:                Upper GI endoscopy Indications:              Acute post hemorrhagic anemia, Hematochezia Providers:                Mallie Mussel L. Loletha Carrow, MD, Zenon Mayo, RN, Alan Mulder, Technician Referring MD:              Medicines:                Monitored Anesthesia Care Complications:            No immediate complications. Estimated Blood Loss:     Estimated blood loss: none. Procedure:                Pre-Anesthesia Assessment:                           - Prior to the procedure, a History and Physical                            was performed, and patient medications and                            allergies were reviewed. The patient's tolerance of                            previous anesthesia was also reviewed. The risks                            and benefits of the procedure and the sedation                            options and risks were discussed with the patient.                            All questions were answered, and informed consent                            was obtained. Prior Anticoagulants: The patient                            last took aspirin and brilinta 1 day prior to the                            procedure. ASA grade 4 After reviewing the risks                            and benefits, the patient was deemed in  satisfactory condition to undergo the procedure.                           After obtaining informed consent, the endoscope was                            passed under direct vision. Throughout the                            procedure, the patient's blood pressure, pulse, and                            oxygen saturations were monitored continuously. The                            [id number]  ([id number]) scope was introduced through the                            mouth, and advanced to the second part of duodenum.                            The upper GI endoscopy was accomplished without                            difficulty. The patient tolerated the procedure                            well. Scope In: Scope Out: Findings:      The larynx was normal.      A small hiatal hernia was present.      A widely patent Schatzki ring (acquired) was found at the       gastroesophageal junction.      The exam of the esophagus was otherwise normal.      The stomach was normal.      The cardia and gastric fundus were normal on retroflexion.      The examined duodenum was normal. Impression:               - Normal larynx.                           - Small hiatal hernia.                           - Widely patent Schatzki ring.                           - Normal stomach.                           - Normal examined duodenum.                           - No specimens collected. Moderate Sedation:      MAC sedation used Recommendation:           - Clear liquid diet.                           -  Perform a colonoscopy tomorrow.                           Serial hemoglobin and hematocrit with goal to keep                            hemoglobin at least 8.0                           - Continue present medications. Procedure Code(s):        --- Professional ---                           [phone], Esophagogastroduodenoscopy, flexible,                            transoral; diagnostic, including collection of                            specimen(s) by brushing or washing, when performed                            (separate procedure) Diagnosis Code(s):        --- Professional ---                           K22.2, Esophageal obstruction                           D62, Acute posthemorrhagic anemia                           K92.1, Melena (includes Hematochezia) CPT copyright 2016 [employer]  Association. All rights reserved. The codes documented in this report are preliminary and upon coder review may  be revised to meet current compliance requirements. Henry L. Loletha Carrow, MD 09/03/2017 3:38:24 PM This report has been signed electronically. Number of Addenda: 0

## 2017-09-03 NOTE — Care Management Note (Signed)
Case Management Note  Patient Details  Name: Abigail [name] MRN: [id number] Date of Birth: 1945-12-24  Subjective/Objective:                  71 yo Patient presents with melanotic stools that are heme positive,not associated with abdominal pain.  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 From home with sister.    Action/Plan: Admit status INPATIENT (GIB); anticipate discharge [place].   Expected Discharge Date:  (unknown)               Expected Discharge Plan:  [place]  In-House Referral:  NA  Discharge planning Services  CM Consult  Post Acute Care Choice:    Choice offered to:     DME Arranged:    DME Agency:     HH Arranged:    HH Agency:     Status of Service:  In process, will continue to follow  If discussed at Long Length of Stay Meetings, dates discussed:    Additional Comments:  Fuller Mandril, RN 09/03/2017, 10:25 AM

## 2017-09-03 NOTE — H&P (View-Only) (Signed)
[facility] Consult: 8:25 AM 09/03/2017  LOS: 0 days    Referring Provider:  Dr Blaine Hamper Primary Care Physician:  Nolene Ebbs, MD Primary Gastroenterologist:  Dr Fuller Plan    Reason for Consultation:  GI bleed, anemia.     HPI: Abigail [name] is a 71 y.o. female. Hx CAD.  MI and stenting 1999.  MI 12/2016, s/p stenting and initiation on Brilinta/ASA PVD.  ACS 01/2017 and repeat cath with patent stents.   Anterior wall MI 02/2017: cath with DES to LAD/CFX.  CHF.  Renal artery stenting 1999.  Previous stage 3 CKD.  Retinal hemorrhage.  Multiple myeloma, "in remission". Anemia, chronic/recurrent PO iron started 02/2017.  02/2017 DVT and left apical thrombus, Coumadin added 02/2017.  Postural hypotension.  Protein calorie malnutrition.  Colonoscopy 10/19/2015.  screening study.  5 mm polyps 2 (tubular adenomas).  Left colon tics. EGD 10/2015 for Hgb 6.7.  Schatski's ring.  3 cm HH.  Gastric erosions.  Healing antral ulcer.  Duodenitis.  Path: chronic active gastritis, no H Pylori.     11/18/16 EGD for left abd pain and nausea/vomiting, anorexia, weight loss.  Grade D esophagitis.  Benign, non-obstructing esophageal stenosis.  Small HH.  Duodenal erythema.    10/28 - 08/21/17 admission with Hematochezia (pt says stools burgundy) presumed diverticular bleed in setting of INR >8, HCAP.  Transfused PRBC x 2.  Hgb 7.3 >> 8.9.  CT abd/pelvis with contrast 08/16/17: sigmoid tics.    Warfarin d/c'd but Brilinta, ASA and daily Protonix continued at discharge, Augmentin for PNA completed 2 days ago.  Was not seen by GI during admit.     Returns to ED with melenic stool and chest pain.  Persitent black/burgundy tone stools up to 3 or 4 /day since discharge. No N/V or GI distress.  No anorexia, no abd pain.  Intermittent exertional CP.  Yesterday at  OV, Dr Doylene Canard reduced [place] from 90 to 60 mg due to bleeding, she took the lowered dose last PM. Not hypotensive or tachycardic.    Hgb 5.8.  MCV 94.  BUN 10.  BNP 1024.  PT/INR normal.  FOBT+.   Imaging including CTA chest confirms pulmonary edema.    Patient not using any NSAIDs.  Compliant with her daily pantoprazole and 81 mg aspirin.  No alcohol.  Family history unremarkable for GI bleeding.   Past Medical History:  Diagnosis Date  . ACS (acute coronary syndrome) ([place]) 06/2017  . Anemia    "off and on all my life" (12/22/2016)  . Anxiety   . Bilateral renal artery stenosis ([place]) 1999   s/p stenting  . Chronic kidney disease (CKD), stage III (moderate) ([place])    Archie Endo 12/22/2016  . Coronary artery disease 1999  . Depression   . Diverticulosis   . Duodenitis   . DVT (deep venous thrombosis) ([facility])    on Coumadin  . Dyspnea   . Gastric erosions   . Gastric ulcer   . GERD (gastroesophageal reflux disease)   . Heart murmur   . History of blood transfusion ~  03/2016   "low HgB; practically nonexistent"  . Hyperlipidemia   . Hypertension   . MI (myocardial infarction) ([place]) 1999  . Multiple myeloma ([place]) dx'd ~ 04/2016  . PAT (paroxysmal atrial tachycardia) ([place]) 12/22/2016  . Retinal hemorrhage, right eye   . Schatzki's ring   . Tachy-brady syndrome ([place])    Archie Endo 12/22/2016  . Tubular adenoma of colon     Past Surgical History:  Procedure Laterality Date  . APPENDECTOMY    . BACK SURGERY    . CATARACT EXTRACTION W/ INTRAOCULAR LENS IMPLANT Right 2014  . CORONARY ANGIOGRAPHY N/A 01/23/2017   Procedure: Coronary Angiography;  Surgeon: [name], MD;  Location: [place] [facility];  Service: Cardiovascular;  Laterality: N/A;  . CORONARY ANGIOPLASTY WITH STENT PLACEMENT  1999  . CORONARY STENT INTERVENTION N/A 12/29/2016   Procedure: Coronary Stent Intervention;  Surgeon: Charolette Forward, MD;  Location: [place] [facility];  Service: Cardiovascular;  Laterality: N/A;  .  CORONARY STENT INTERVENTION N/A 02/24/2017   Procedure: Coronary Stent Intervention;  Surgeon: Charolette Forward, MD;  Location: [place] [facility];  Service: Cardiovascular;  Laterality: N/A;  . ESOPHAGOGASTRODUODENOSCOPY N/A 11/03/2015   Procedure: ESOPHAGOGASTRODUODENOSCOPY (EGD);  Surgeon: Carol Ada, MD;  Location[facility] ENDOSCOPY;  Service: Endoscopy;  Laterality: N/A;  . ESOPHAGOGASTRODUODENOSCOPY (EGD) WITH PROPOFOL N/A 11/18/2016   Procedure: ESOPHAGOGASTRODUODENOSCOPY (EGD) WITH PROPOFOL;  Surgeon: Ladene Artist, MD;  Location: [facility] ENDOSCOPY;  Service: Endoscopy;  Laterality: N/A;  . LEFT HEART CATH AND CORONARY ANGIOGRAPHY N/A 12/24/2016   Procedure: Left Heart Cath and Coronary Angiography;  Surgeon: [name], MD;  Location: [place] [facility];  Service: Cardiovascular;  Laterality: N/A;  . LEFT HEART CATH AND CORONARY ANGIOGRAPHY N/A 02/24/2017   Procedure: Left Heart Cath and Coronary Angiography;  Surgeon: Charolette Forward, MD;  Location: [place] [facility];  Service: Cardiovascular;  Laterality: N/A;  . LEFT HEART CATH AND CORONARY ANGIOGRAPHY N/A 03/05/2017   Procedure: Left Heart Cath and Coronary Angiography;  Surgeon: [name], MD;  Location: [place] [facility];  Service: Cardiovascular;  Laterality: N/A;  . LUMBAR [place]    . RENAL ARTERY STENT  1999   bil RAS so ? bil vs unilateral stents  . RETINAL LASER PROCEDURE Right 2012   "bleeding"  . TONSILLECTOMY    . TOTAL ABDOMINAL HYSTERECTOMY      Prior to Admission medications   Medication Sig Start Date End Date Taking? Authorizing Provider  acetaminophen (TYLENOL) 500 MG tablet Take 1,000 mg by mouth every 6 (six) hours as needed for mild pain or headache.   Yes [provider]  amiodarone (PACERONE) 200 MG tablet Take 0.5 tablets (100 mg total) by mouth daily. 03/12/17  Yes [name], MD  Ascorbic Acid (VITAMIN C) 1000 MG tablet Take 1,000 mg by mouth daily.    Yes [provider]  aspirin EC 81  MG tablet Take 1 tablet (81 mg total) by mouth daily. 08/21/17  Yes Bonnielee Haff, MD  atorvastatin (LIPITOR) 80 MG tablet Take 1 tablet (80 mg total) by mouth daily at 6 PM. 03/01/17  Yes [name], MD  calcium carbonate (OS-CAL - DOSED IN MG OF ELEMENTAL CALCIUM) 1250 (500 Ca) MG tablet Take 1 tablet by mouth daily with breakfast.   Yes [provider]  Cholecalciferol (VITAMIN D PO) Take 1 capsule by mouth daily.   Yes [provider]  diclofenac sodium (VOLTAREN) 1 % GEL Apply 2 g topically 4 (four)  times daily. 08/21/17  Yes Bonnielee Haff, MD  ferrous sulfate 325 (65 FE) MG tablet Take 1 tablet (325 mg total) by mouth daily with breakfast. 03/02/17  Yes [name], MD  gabapentin (NEURONTIN) 100 MG capsule Take 1 capsule (100 mg total) by mouth 3 (three) times daily. 08/21/17  Yes Bonnielee Haff, MD  nitroGLYCERIN (NITROSTAT) 0.4 MG SL tablet Place 0.4 mg under the tongue every 5 (five) minutes as needed for chest pain. 03/20/17  Yes [provider]  pantoprazole (PROTONIX) 40 MG tablet Take 1 tablet (40 mg total) by mouth daily. 03/12/17  Yes [name], MD  pyridostigmine (MESTINON) 60 MG tablet Take 0.5 tablets (30 mg total) by mouth 2 (two) times daily. 01/25/17  Yes [name], MD  ticagrelor (BRILINTA) 60 MG TABS tablet Take 60 mg 2 (two) times daily by mouth.   Yes [provider]  traMADol (ULTRAM) 50 MG tablet Take 1 tablet (50 mg total) by mouth every 6 (six) hours as needed for moderate pain. 08/21/17  Yes Bonnielee Haff, MD  acyclovir (ZOVIRAX) 400 MG tablet TAKE 1 TABLET (400 MG TOTAL) BY MOUTH 2 (TWO) TIMES DAILY. Patient not taking: Reported on 09/03/2017 08/29/16   Ladell Pier, MD  ticagrelor (BRILINTA) 90 MG TABS tablet Take 1 tablet (90 mg total) by mouth 2 (two) times daily. Patient not taking: Reported on 09/03/2017 12/27/16   [name], MD   The aspirin was recently started after the recent hospitalization for GI  bleeding. She was not taking any aspirin tablets prior to that, and denies NSAID use.  Scheduled Meds: . amiodarone  100 mg Oral Daily  . furosemide  40 mg Intravenous Q12H  . gabapentin  100 mg Oral TID  . [START ON 09/06/2017] pantoprazole  40 mg Intravenous Q12H  . pyridostigmine  30 mg Oral BID   Infusions: . pantoprazole (PROTONIX) IVPB    . pantoprozole (PROTONIX) infusion     PRN Meds: acetaminophen **OR** acetaminophen, hydrALAZINE, morphine injection, nitroGLYCERIN   Allergies as of 09/03/2017 - Review Complete 09/03/2017  Allergen Reaction Noted  . Sulfa antibiotics Rash 12/30/2011    Family History  Problem Relation Age of Onset  . Breast cancer Mother   . Breast cancer Sister   . Breast cancer Maternal Aunt   . Colon cancer Neg Hx     Social History   Socioeconomic History  . Marital status: Single    Spouse name: Not on file  . Number of children: Not on file  . Years of education: Not on file  . Highest education level: Not on file  Social Needs  . Financial resource strain: Not on file  . Food insecurity - worry: Not on file  . Food insecurity - inability: Not on file  . Transportation needs - medical: Not on file  . Transportation needs - non-medical: Not on file  Occupational History  . Not on file  Tobacco Use  . Smoking status: Former Smoker    Packs/day: 0.50    Years: 50.00    Pack years: 25.00    Types: Cigarettes    Last attempt to quit: 11/20/2016    Years since quitting: 0.7  . Smokeless tobacco: Never Used  Substance and Sexual Activity  . Alcohol use: No    Alcohol/week: 0.0 oz  . Drug use: No  . Sexual activity: No  Other Topics Concern  . Not on file  Social History Narrative  . Not on file  REVIEW OF SYSTEMS: Constitutional:  Fatigue.  ENT:  No nose bleeds Pulm:  DOE, SOB without cough CV:  No palpitations, no LE edema.  GU:  No hematuria, no frequency GI:  Per HPI Heme:  No unusual or excessive  bleeding/bruising. Transfusions:  Per HPI MS:  Leg pain.   Neuro:  No headaches, no peripheral tingling or numbness Derm:  No itching, no rash or sores.  Endocrine:  No sweats or chills.  No polyuria or dysuria Immunization:  Flu shot current.   Travel:  None beyond local counties in last few months.    PHYSICAL EXAM: Vital signs in last 24 hours: Vitals:   09/03/17 0700 09/03/17 0804  BP: (!) 161/63 102/87  Pulse: 64 61  Resp: (!) 21 16  Temp:  98.8 F (37.1 C)  SpO2: 99% 100%   Wt Readings from Last 3 Encounters:  08/17/17 72 kg (158 lb 11.7 oz)  07/30/17 71.6 kg (157 lb 12.8 oz)  06/25/17 64.6 kg (142 lb 6.4 oz)    General: Pleasant, alert, well-informed, comfortable, frail appearing elderly AAF. She becomes tearful during the exam out of frustration for her repeated hospitalizations and multiple medical issues. Head: Atraumatic.  Symmetric facies.  No facial edema. Eyes: No scleral icterus.  Conjunctiva pale.  EOMI. Ears: Not hard of hearing Nose: No congestion or discharge. Mouth: Moist, clear, pink oral mucosa.  Tongue midline. Neck: No JVD, no masses, no thyromegaly. Lungs: Clear bilaterally.  No labored breathing or cough. Heart: RRR.  No MRG.  S1, S2 present. Abdomen: Soft.  Not tender or distended.  No HSM, bruits, masses, hernias.  Bowel sounds normal timbre but hypoactive..   Rectal: Smear of deep, red colored blood on exam glove.  No masses.  Visually this does not look like melena. Musc/Skeltl: Spinal scoliosis and kyphosis.  No erythematous or swollen joints.  No muscle wasting. Extremities: Slight pedal edema.  Feet are warm with good capillary refill. Neurologic: Patient is a knowledgeable about her medical issues, the timing of events and medicines.she, knows all of her medications/doses/when she last took them.  Moves all 4 limbs.  Strength not tested but no gross deficits.  No tremors.  Alert and oriented x3. Skin: No telangiectasia, rashes or  sores. Tattoos: None Nodes: No cervical or inguinal adenopathy. Psych: Pleasant, calm, cooperative.  Fluid speech.  Excellent fund of knowledge.  Intake/Output from previous day: No intake/output data recorded. Intake/Output this shift: Total I/O In: 10 [I.V.:10] Out: 1100 [Urine:1100]  LAB RESULTS: Recent Labs    09/03/17 0449  WBC 8.1  HGB 5.9*  HCT 18.8*  PLT 235   BMET Lab Results  Component Value Date   NA 141 09/03/2017   NA 142 08/21/2017   NA 140 08/20/2017   K 3.8 09/03/2017   K 3.9 08/21/2017   K 4.2 08/20/2017   CL 114 (H) 09/03/2017   CL 110 08/21/2017   CL 110 08/20/2017   CO2 20 (L) 09/03/2017   CO2 24 08/21/2017   CO2 22 08/20/2017   GLUCOSE 99 09/03/2017   GLUCOSE 106 (H) 08/21/2017   GLUCOSE 98 08/20/2017   BUN 10 09/03/2017   BUN 24 (H) 08/21/2017   BUN 16 08/20/2017   CREATININE 1.01 (H) 09/03/2017   CREATININE 1.21 (H) 08/21/2017   CREATININE 1.03 (H) 08/20/2017   CALCIUM 8.3 (L) 09/03/2017   CALCIUM 8.6 (L) 08/21/2017   CALCIUM 8.3 (L) 08/20/2017   LFT No results for input(s): PROT, ALBUMIN, AST, ALT,  ALKPHOS, BILITOT, BILIDIR, IBILI in the last 72 hours. PT/INR Lab Results  Component Value Date   INR 1.04 09/03/2017   INR 1.84 08/18/2017   INR 8.05 (HH) 08/17/2017    Drugs of Abuse  No results found for: LABOPIA, COCAINSCRNUR, LABBENZ, AMPHETMU, THCU, LABBARB   RADIOLOGY STUDIES: Dg Chest 2 View  Result Date: 09/03/2017 CLINICAL DATA:  Acute onset of shortness of breath and generalized chest pain. EXAM: CHEST  2 VIEW COMPARISON:  Chest radiograph performed 08/16/2017 FINDINGS: The lungs are well-aerated. Minimal bibasilar opacities may reflect atelectasis or mild interstitial edema. Vascular congestion is noted. There is no evidence of focal opacification, pleural effusion or pneumothorax. The heart is borderline normal in size. No acute osseous abnormalities are seen. IMPRESSION: Vascular congestion noted. Minimal bibasilar  opacities may reflect atelectasis or mild interstitial edema. Electronically Signed   By: Garald Balding M.D.   On: 09/03/2017 05:43   Ct Angio Chest Pe W And/or Wo Contrast  Result Date: 09/03/2017 CLINICAL DATA:  Subacute onset of intermittent left-sided chest pain. EXAM: CT ANGIOGRAPHY CHEST WITH CONTRAST TECHNIQUE: Multidetector CT imaging of the chest was performed using the standard protocol during bolus administration of intravenous contrast. Multiplanar CT image reconstructions and MIPs were obtained to evaluate the vascular anatomy. CONTRAST:  144m ISOVUE-370 IOPAMIDOL (ISOVUE-370) INJECTION 76% COMPARISON:  Chest radiograph performed earlier today at 5:05 a.m., and CTA of the chest performed 07/24/2015 FINDINGS: Cardiovascular:  There is no evidence of pulmonary embolus. The heart is borderline normal in size. Diffuse coronary artery calcifications are seen. Mild calcification is seen along the thoracic aorta and proximal great vessels. Mediastinum/Nodes: There is nonspecific wall thickening along the mainstem bronchi bilaterally, which may reflect chronic inflammation. No definite mediastinal lymphadenopathy is seen. No pericardial effusion is identified. The thyroid gland is unremarkable in appearance. No axillary lymphadenopathy is appreciated. Lungs/Pleura: Trace bilateral pleural fluid is noted. Diffuse interstitial prominence is noted at the lung bases, with hazy opacities, likely reflecting mild interstitial edema. Diffuse peribronchial thickening is noted bilaterally. No pneumothorax is seen.  No dominant mass is seen. Upper Abdomen: The visualized portions of the liver and spleen are unremarkable. The visualized portions of the gallbladder, pancreas, adrenal glands and kidneys are within normal limits, aside from a left renal cyst. Bilateral renal artery stents are noted. Musculoskeletal: No acute osseous abnormalities are identified. There is grade 1 retrolisthesis of T12 on L1 and of L1 on  L2. The visualized musculature is unremarkable in appearance. Review of the MIP images confirms the above findings. IMPRESSION: 1. No evidence of pulmonary embolus. 2. Trace bilateral pleural fluid. Diffuse interstitial prominence at the lung bases, with hazy opacities, likely reflecting mild pulmonary edema. 3. Diffuse peribronchial thickening noted bilaterally. Nonspecific wall thickening along the mainstem bronchi bilaterally. This may reflect chronic inflammation. 4. Diffuse coronary artery calcifications noted. 5. Left renal cyst. 6. Grade 1 retrolisthesis of T12 on L1 and of L1 on L2. Electronically Signed   By: JGarald BaldingM.D.   On: 09/03/2017 06:49     IMPRESSION:   *  GI bleed.  Presumed diverticular bleeding during admission ending 11/2 but ongoing melenic type stools since then.  Hx of severe GERD, gastric ulcers, gastroduodenitis, H Pylori negative on previous EGDs and compliant with daily PPI.  Latest EGD 10/2016. Hx diverticulosis and adenomatous colon polyps, latest colonoscopy 09/2015.    *  Blood loss anemia.  Transfusion PRBCs x 3 started.  Transfused 2 PRBCs during recent admission.    *  CAD.  Stenting in 1999 and march and May 2018.  On Brilinta, 81 ASA.    *  Hx DVT and atrial thrombus.  Coumadin d/c'd late 07/2017 due to GI bleed in setting of INR> 8.    *  CKD  *  MM, dx 05/2016.  Treated with chemo Zometa, stopped ~ "18 weeks ago" per pt.  No evidence of progressive disease per 07/30/17 oncology note.   Pt says she is in remission.     *  Mild pulmonary edema.  Recent treatment completed for HCAP.      PLAN:     *  EGD this afternoon 3 PM.  Stat CBC at 1330.    *  Reluctant to order CT angio of abdomen/pelvis due to her just having undergone CT angio of the chest and wanting to avoid excessive contrast exposure due to her recent mild AKI.  *  Not convinced she needs PPI drip but it has been initiated so will leave this in place for now.  If no plans for  endoscopy today, will allow her a diet of clear liquids.     Azucena Freed  09/03/2017, 8:25 AM Pager: [phone]  I have reviewed the entire case in detail with the above APP and discussed the plan in detail.  Therefore, I agree with the diagnoses recorded above. In addition,  I have personally interviewed and examined the patient and have personally reviewed any abdominal/pelvic CT scan images.  My additional thoughts are as follows:  This is a 71 year old woman with multiple active medical issues, most notably complex coronary disease with drug eluting stents placed within the last 6-9 months requiring dual antiplatelet therapy as well as pulmonary edema, chronic kidney disease and atrial fibrillation. Poor oral anticoagulation was recently discontinued when it was supratherapeutic and the patient had GI bleeding. By her description, her GI bleeding did not stop after the last hospitalization. She describes the stool as mostly black, but at times burgundy. Geoffery Spruce denies abdominal pain, but admits that her appetite is decreased. She has a severe anemia of acute GI blood loss from an unclear source in the digestive tract. I am concerned that it may be an upper GI bleed, so I'm planning to do an upper endoscopy today. She is familiar with the procedure, having had it done as recently as January of this year. I again reviewed the procedure in detail along with the risks and benefits and she is agreeable. We are waiting for her to start her third unit of PRBCs per the request of anesthesia services. Fortunately, she has a normal blood pressure and heart rate right now. She has mild pulmonary edema with crackles at the bases bilaterally, but is breathing comfortably on minimal supplemental oxygen. She describes chest pain but thus far has no evidence of acute coronary syndrome. Troponins have been negative.  If upper endoscopy does not localize the source of bleeding, she will most likely require  colonoscopy tomorrow. I have discussed this with her and she is agreeable. Naturally, we need to keep her off antiplatelet therapy right now as the risks outweigh the benefits, even with drug eluting coronary stent placement earlier this year.  Patient at increased risk for cardiopulmonary complications of procedure due to medical comorbidities.     Nelida Meuse III Pager [phone]  Mon-Fri 8a-5p [phone] after 5p, weekends, holidays

## 2017-09-03 NOTE — ED Notes (Signed)
Pt transported on monitor to endoscopy

## 2017-09-03 NOTE — Consult Note (Signed)
                                                                           [facility] Consult: 8:25 AM 09/03/2017  LOS: 0 days    Referring Provider:  Dr Niu Primary Care Physician:  Avbuere, Edwin, MD Primary Gastroenterologist:  Dr Stark    Reason for Consultation:  GI bleed, anemia.     HPI: Abigail [name] is a 71 y.o. female. Hx CAD.  MI and stenting 1999.  MI 12/2016, s/p stenting and initiation on Brilinta/ASA PVD.  ACS 01/2017 and repeat cath with patent stents.   Anterior wall MI 02/2017: cath with DES to LAD/CFX.  CHF.  Renal artery stenting 1999.  Previous stage 3 CKD.  Retinal hemorrhage.  Multiple myeloma, "in remission". Anemia, chronic/recurrent PO iron started 02/2017.  02/2017 DVT and left apical thrombus, Coumadin added 02/2017.  Postural hypotension.  Protein calorie malnutrition.  Colonoscopy 10/19/2015.  screening study.  5 mm polyps 2 (tubular adenomas).  Left colon tics. EGD 10/2015 for Hgb 6.7.  Schatski's ring.  3 cm HH.  Gastric erosions.  Healing antral ulcer.  Duodenitis.  Path: chronic active gastritis, no H Pylori.     11/18/16 EGD for left abd pain and nausea/vomiting, anorexia, weight loss.  Grade D esophagitis.  Benign, non-obstructing esophageal stenosis.  Small HH.  Duodenal erythema.    10/28 - 08/21/17 admission with Hematochezia (pt says stools burgundy) presumed diverticular bleed in setting of INR >8, HCAP.  Transfused PRBC x 2.  Hgb 7.3 >> 8.9.  CT abd/pelvis with contrast 08/16/17: sigmoid tics.    Warfarin d/c'd but Brilinta, ASA and daily Protonix continued at discharge, Augmentin for PNA completed 2 days ago.  Was not seen by GI during admit.     Returns to ED with melenic stool and chest pain.  Persitent black/burgundy tone stools up to 3 or 4 /day since discharge. No N/V or GI distress.  No anorexia, no abd pain.  Intermittent exertional CP.  Yesterday at  [facility], Dr Kadakia reduced Brilinta from 90 to 60 mg due to bleeding, she took the lowered dose last PM. Not hypotensive or tachycardic.    Hgb 5.8.  MCV 94.  BUN 10.  BNP 1024.  PT/INR normal.  FOBT+.   Imaging including CTA chest confirms pulmonary edema.    Patient not using any NSAIDs.  Compliant with her daily pantoprazole and 81 mg aspirin.  No alcohol.  Family history unremarkable for GI bleeding.   Past Medical History:  Diagnosis Date  . ACS (acute coronary syndrome) (HCC) 06/2017  . Anemia    "off and on all my life" (12/22/2016)  . Anxiety   . Bilateral renal artery stenosis (HCC) 1999   s/p stenting  . Chronic kidney disease (CKD), stage III (moderate) (HCC)    /notes 12/22/2016  . Coronary artery disease 1999  . Depression   . Diverticulosis   . Duodenitis   . DVT (deep venous thrombosis) (HCC)    on Coumadin  . Dyspnea   . Gastric erosions   . Gastric ulcer   . GERD (gastroesophageal reflux disease)   . Heart murmur   . History of blood transfusion ~   03/2016   "low HgB; practically nonexistent"  . Hyperlipidemia   . Hypertension   . MI (myocardial infarction) (HCC) 1999  . Multiple myeloma (HCC) dx'd ~ 04/2016  . PAT (paroxysmal atrial tachycardia) (HCC) 12/22/2016  . Retinal hemorrhage, right eye   . Schatzki's ring   . Tachy-brady syndrome (HCC)    /notes 12/22/2016  . Tubular adenoma of colon     Past Surgical History:  Procedure Laterality Date  . APPENDECTOMY    . BACK SURGERY    . CATARACT EXTRACTION W/ INTRAOCULAR LENS IMPLANT Right 2014  . CORONARY ANGIOGRAPHY N/A 01/23/2017   Procedure: Coronary Angiography;  Surgeon: Ajay Kadakia, MD;  Location: [facility];  Service: Cardiovascular;  Laterality: N/A;  . CORONARY ANGIOPLASTY WITH STENT PLACEMENT  1999  . CORONARY STENT INTERVENTION N/A 12/29/2016   Procedure: Coronary Stent Intervention;  Surgeon: Mohan Harwani, MD;  Location: [facility];  Service: Cardiovascular;  Laterality: N/A;  .  CORONARY STENT INTERVENTION N/A 02/24/2017   Procedure: Coronary Stent Intervention;  Surgeon: Harwani, Mohan, MD;  Location: [facility];  Service: Cardiovascular;  Laterality: N/A;  . ESOPHAGOGASTRODUODENOSCOPY N/A 11/03/2015   Procedure: ESOPHAGOGASTRODUODENOSCOPY (EGD);  Surgeon: Patrick Hung, MD;  Location: [facility];  Service: Endoscopy;  Laterality: N/A;  . ESOPHAGOGASTRODUODENOSCOPY (EGD) WITH PROPOFOL N/A 11/18/2016   Procedure: ESOPHAGOGASTRODUODENOSCOPY (EGD) WITH PROPOFOL;  Surgeon: Malcolm T Stark, MD;  Location: [facility];  Service: Endoscopy;  Laterality: N/A;  . LEFT HEART CATH AND CORONARY ANGIOGRAPHY N/A 12/24/2016   Procedure: Left Heart Cath and Coronary Angiography;  Surgeon: Ajay Kadakia, MD;  Location: [facility];  Service: Cardiovascular;  Laterality: N/A;  . LEFT HEART CATH AND CORONARY ANGIOGRAPHY N/A 02/24/2017   Procedure: Left Heart Cath and Coronary Angiography;  Surgeon: Harwani, Mohan, MD;  Location: [facility];  Service: Cardiovascular;  Laterality: N/A;  . LEFT HEART CATH AND CORONARY ANGIOGRAPHY N/A 03/05/2017   Procedure: Left Heart Cath and Coronary Angiography;  Surgeon: Kadakia, Ajay, MD;  Location: [facility];  Service: Cardiovascular;  Laterality: N/A;  . LUMBAR DISC SURGERY    . RENAL ARTERY STENT  1999   bil RAS so ? bil vs unilateral stents  . RETINAL LASER PROCEDURE Right 2012   "bleeding"  . TONSILLECTOMY    . TOTAL ABDOMINAL HYSTERECTOMY      Prior to Admission medications   Medication Sig Start Date End Date Taking? Authorizing Provider  acetaminophen (TYLENOL) 500 MG tablet Take 1,000 mg by mouth every 6 (six) hours as needed for mild pain or headache.   Yes [provider]  amiodarone (PACERONE) 200 MG tablet Take 0.5 tablets (100 mg total) by mouth daily. 03/12/17  Yes Kadakia, Ajay, MD  Ascorbic Acid (VITAMIN C) 1000 MG tablet Take 1,000 mg by mouth daily.    Yes [provider]  aspirin EC 81  MG tablet Take 1 tablet (81 mg total) by mouth daily. 08/21/17  Yes Krishnan, Gokul, MD  atorvastatin (LIPITOR) 80 MG tablet Take 1 tablet (80 mg total) by mouth daily at 6 PM. 03/01/17  Yes Kadakia, Ajay, MD  calcium carbonate (OS-CAL - DOSED IN MG OF ELEMENTAL CALCIUM) 1250 (500 Ca) MG tablet Take 1 tablet by mouth daily with breakfast.   Yes [provider]  Cholecalciferol (VITAMIN D PO) Take 1 capsule by mouth daily.   Yes [provider]  diclofenac sodium (VOLTAREN) 1 % GEL Apply 2 g topically 4 (four)   times daily. 08/21/17  Yes Krishnan, Gokul, MD  ferrous sulfate 325 (65 FE) MG tablet Take 1 tablet (325 mg total) by mouth daily with breakfast. 03/02/17  Yes Kadakia, Ajay, MD  gabapentin (NEURONTIN) 100 MG capsule Take 1 capsule (100 mg total) by mouth 3 (three) times daily. 08/21/17  Yes Krishnan, Gokul, MD  nitroGLYCERIN (NITROSTAT) 0.4 MG SL tablet Place 0.4 mg under the tongue every 5 (five) minutes as needed for chest pain. 03/20/17  Yes [provider]  pantoprazole (PROTONIX) 40 MG tablet Take 1 tablet (40 mg total) by mouth daily. 03/12/17  Yes Kadakia, Ajay, MD  pyridostigmine (MESTINON) 60 MG tablet Take 0.5 tablets (30 mg total) by mouth 2 (two) times daily. 01/25/17  Yes Kadakia, Ajay, MD  ticagrelor (BRILINTA) 60 MG TABS tablet Take 60 mg 2 (two) times daily by mouth.   Yes [provider]  traMADol (ULTRAM) 50 MG tablet Take 1 tablet (50 mg total) by mouth every 6 (six) hours as needed for moderate pain. 08/21/17  Yes Krishnan, Gokul, MD  acyclovir (ZOVIRAX) 400 MG tablet TAKE 1 TABLET (400 MG TOTAL) BY MOUTH 2 (TWO) TIMES DAILY. Patient not taking: Reported on 09/03/2017 08/29/16   Sherrill, Gary B, MD  ticagrelor (BRILINTA) 90 MG TABS tablet Take 1 tablet (90 mg total) by mouth 2 (two) times daily. Patient not taking: Reported on 09/03/2017 12/27/16   Kadakia, Ajay, MD   The aspirin was recently started after the recent hospitalization for GI  bleeding. She was not taking any aspirin tablets prior to that, and denies NSAID use.  Scheduled Meds: . amiodarone  100 mg Oral Daily  . furosemide  40 mg Intravenous Q12H  . gabapentin  100 mg Oral TID  . [START ON 09/06/2017] pantoprazole  40 mg Intravenous Q12H  . pyridostigmine  30 mg Oral BID   Infusions: . pantoprazole (PROTONIX) IVPB    . pantoprozole (PROTONIX) infusion     PRN Meds: acetaminophen **OR** acetaminophen, hydrALAZINE, morphine injection, nitroGLYCERIN   Allergies as of 09/03/2017 - Review Complete 09/03/2017  Allergen Reaction Noted  . Sulfa antibiotics Rash 12/30/2011    Family History  Problem Relation Age of Onset  . Breast cancer Mother   . Breast cancer Sister   . Breast cancer Maternal Aunt   . Colon cancer Neg Hx     Social History   Socioeconomic History  . Marital status: Single    Spouse name: Not on file  . Number of children: Not on file  . Years of education: Not on file  . Highest education level: Not on file  Social Needs  . Financial resource strain: Not on file  . Food insecurity - worry: Not on file  . Food insecurity - inability: Not on file  . Transportation needs - medical: Not on file  . Transportation needs - non-medical: Not on file  Occupational History  . Not on file  Tobacco Use  . Smoking status: Former Smoker    Packs/day: 0.50    Years: 50.00    Pack years: 25.00    Types: Cigarettes    Last attempt to quit: 11/20/2016    Years since quitting: 0.7  . Smokeless tobacco: Never Used  Substance and Sexual Activity  . Alcohol use: No    Alcohol/week: 0.0 oz  . Drug use: No  . Sexual activity: No  Other Topics Concern  . Not on file  Social History Narrative  . Not on file      REVIEW OF SYSTEMS: Constitutional:  Fatigue.  ENT:  No nose bleeds Pulm:  DOE, SOB without cough CV:  No palpitations, no LE edema.  GU:  No hematuria, no frequency GI:  Per HPI Heme:  No unusual or excessive  bleeding/bruising. Transfusions:  Per HPI MS:  Leg pain.   Neuro:  No headaches, no peripheral tingling or numbness Derm:  No itching, no rash or sores.  Endocrine:  No sweats or chills.  No polyuria or dysuria Immunization:  Flu shot current.   Travel:  None beyond local counties in last few months.    PHYSICAL EXAM: Vital signs in last 24 hours: Vitals:   09/03/17 0700 09/03/17 0804  BP: (!) 161/63 102/87  Pulse: 64 61  Resp: (!) 21 16  Temp:  98.8 F (37.1 C)  SpO2: 99% 100%   Wt Readings from Last 3 Encounters:  08/17/17 72 kg (158 lb 11.7 oz)  07/30/17 71.6 kg (157 lb 12.8 oz)  06/25/17 64.6 kg (142 lb 6.4 oz)    General: Pleasant, alert, well-informed, comfortable, frail appearing elderly AAF. She becomes tearful during the exam out of frustration for her repeated hospitalizations and multiple medical issues. Head: Atraumatic.  Symmetric facies.  No facial edema. Eyes: No scleral icterus.  Conjunctiva pale.  EOMI. Ears: Not hard of hearing Nose: No congestion or discharge. Mouth: Moist, clear, pink oral mucosa.  Tongue midline. Neck: No JVD, no masses, no thyromegaly. Lungs: Clear bilaterally.  No labored breathing or cough. Heart: RRR.  No MRG.  S1, S2 present. Abdomen: Soft.  Not tender or distended.  No HSM, bruits, masses, hernias.  Bowel sounds normal timbre but hypoactive..   Rectal: Smear of deep, red colored blood on exam glove.  No masses.  Visually this does not look like melena. Musc/Skeltl: Spinal scoliosis and kyphosis.  No erythematous or swollen joints.  No muscle wasting. Extremities: Slight pedal edema.  Feet are warm with good capillary refill. Neurologic: Patient is a knowledgeable about her medical issues, the timing of events and medicines.she, knows all of her medications/doses/when she last took them.  Moves all 4 limbs.  Strength not tested but no gross deficits.  No tremors.  Alert and oriented x3. Skin: No telangiectasia, rashes or  sores. Tattoos: None Nodes: No cervical or inguinal adenopathy. Psych: Pleasant, calm, cooperative.  Fluid speech.  Excellent fund of knowledge.  Intake/Output from previous day: No intake/output data recorded. Intake/Output this shift: Total I/O In: 10 [I.V.:10] Out: 1100 [Urine:1100]  LAB RESULTS: Recent Labs    09/03/17 0449  WBC 8.1  HGB 5.9*  HCT 18.8*  PLT 235   BMET Lab Results  Component Value Date   NA 141 09/03/2017   NA 142 08/21/2017   NA 140 08/20/2017   K 3.8 09/03/2017   K 3.9 08/21/2017   K 4.2 08/20/2017   CL 114 (H) 09/03/2017   CL 110 08/21/2017   CL 110 08/20/2017   CO2 20 (L) 09/03/2017   CO2 24 08/21/2017   CO2 22 08/20/2017   GLUCOSE 99 09/03/2017   GLUCOSE 106 (H) 08/21/2017   GLUCOSE 98 08/20/2017   BUN 10 09/03/2017   BUN 24 (H) 08/21/2017   BUN 16 08/20/2017   CREATININE 1.01 (H) 09/03/2017   CREATININE 1.21 (H) 08/21/2017   CREATININE 1.03 (H) 08/20/2017   CALCIUM 8.3 (L) 09/03/2017   CALCIUM 8.6 (L) 08/21/2017   CALCIUM 8.3 (L) 08/20/2017   LFT No results for input(s): PROT, ALBUMIN, AST, ALT,   ALKPHOS, BILITOT, BILIDIR, IBILI in the last 72 hours. PT/INR Lab Results  Component Value Date   INR 1.04 09/03/2017   INR 1.84 08/18/2017   INR 8.05 (HH) 08/17/2017    Drugs of Abuse  No results found for: LABOPIA, COCAINSCRNUR, LABBENZ, AMPHETMU, THCU, LABBARB   RADIOLOGY STUDIES: Dg Chest 2 View  Result Date: 09/03/2017 CLINICAL DATA:  Acute onset of shortness of breath and generalized chest pain. EXAM: CHEST  2 VIEW COMPARISON:  Chest radiograph performed 08/16/2017 FINDINGS: The lungs are well-aerated. Minimal bibasilar opacities may reflect atelectasis or mild interstitial edema. Vascular congestion is noted. There is no evidence of focal opacification, pleural effusion or pneumothorax. The heart is borderline normal in size. No acute osseous abnormalities are seen. IMPRESSION: Vascular congestion noted. Minimal bibasilar  opacities may reflect atelectasis or mild interstitial edema. Electronically Signed   By: Jeffery  Chang M.D.   On: 09/03/2017 05:43   Ct Angio Chest Pe W And/or Wo Contrast  Result Date: 09/03/2017 CLINICAL DATA:  Subacute onset of intermittent left-sided chest pain. EXAM: CT ANGIOGRAPHY CHEST WITH CONTRAST TECHNIQUE: Multidetector CT imaging of the chest was performed using the standard protocol during bolus administration of intravenous contrast. Multiplanar CT image reconstructions and MIPs were obtained to evaluate the vascular anatomy. CONTRAST:  100mL ISOVUE-370 IOPAMIDOL (ISOVUE-370) INJECTION 76% COMPARISON:  Chest radiograph performed earlier today at 5:05 a.m., and CTA of the chest performed 07/24/2015 FINDINGS: Cardiovascular:  There is no evidence of pulmonary embolus. The heart is borderline normal in size. Diffuse coronary artery calcifications are seen. Mild calcification is seen along the thoracic aorta and proximal great vessels. Mediastinum/Nodes: There is nonspecific wall thickening along the mainstem bronchi bilaterally, which may reflect chronic inflammation. No definite mediastinal lymphadenopathy is seen. No pericardial effusion is identified. The thyroid gland is unremarkable in appearance. No axillary lymphadenopathy is appreciated. Lungs/Pleura: Trace bilateral pleural fluid is noted. Diffuse interstitial prominence is noted at the lung bases, with hazy opacities, likely reflecting mild interstitial edema. Diffuse peribronchial thickening is noted bilaterally. No pneumothorax is seen.  No dominant mass is seen. Upper Abdomen: The visualized portions of the liver and spleen are unremarkable. The visualized portions of the gallbladder, pancreas, adrenal glands and kidneys are within normal limits, aside from a left renal cyst. Bilateral renal artery stents are noted. Musculoskeletal: No acute osseous abnormalities are identified. There is grade 1 retrolisthesis of T12 on L1 and of L1 on  L2. The visualized musculature is unremarkable in appearance. Review of the MIP images confirms the above findings. IMPRESSION: 1. No evidence of pulmonary embolus. 2. Trace bilateral pleural fluid. Diffuse interstitial prominence at the lung bases, with hazy opacities, likely reflecting mild pulmonary edema. 3. Diffuse peribronchial thickening noted bilaterally. Nonspecific wall thickening along the mainstem bronchi bilaterally. This may reflect chronic inflammation. 4. Diffuse coronary artery calcifications noted. 5. Left renal cyst. 6. Grade 1 retrolisthesis of T12 on L1 and of L1 on L2. Electronically Signed   By: Jeffery  Chang M.D.   On: 09/03/2017 06:49     IMPRESSION:   *  GI bleed.  Presumed diverticular bleeding during admission ending 11/2 but ongoing melenic type stools since then.  Hx of severe GERD, gastric ulcers, gastroduodenitis, H Pylori negative on previous EGDs and compliant with daily PPI.  Latest EGD 10/2016. Hx diverticulosis and adenomatous colon polyps, latest colonoscopy 09/2015.    *  Blood loss anemia.  Transfusion PRBCs x 3 started.  Transfused 2 PRBCs during recent admission.    *    CAD.  Stenting in 1999 and march and May 2018.  On Brilinta, 81 ASA.    *  Hx DVT and atrial thrombus.  Coumadin d/c'd late 07/2017 due to GI bleed in setting of INR> 8.    *  CKD  *  MM, dx 05/2016.  Treated with chemo Zometa, stopped ~ "18 weeks ago" per pt.  No evidence of progressive disease per 07/30/17 oncology note.   Pt says she is in remission.     *  Mild pulmonary edema.  Recent treatment completed for HCAP.      PLAN:     *  EGD this afternoon 3 PM.  Stat CBC at 1330.    *  Reluctant to order CT angio of abdomen/pelvis due to her just having undergone CT angio of the chest and wanting to avoid excessive contrast exposure due to her recent mild AKI.  *  Not convinced she needs PPI drip but it has been initiated so will leave this in place for now.  If no plans for  endoscopy today, will allow her a diet of clear liquids.     Sarah Gribbin  09/03/2017, 8:25 AM Pager: [phone]  I have reviewed the entire case in detail with the above APP and discussed the plan in detail.  Therefore, I agree with the diagnoses recorded above. In addition,  I have personally interviewed and examined the patient and have personally reviewed any abdominal/pelvic CT scan images.  My additional thoughts are as follows:  This is a 71-year-old woman with multiple active medical issues, most notably complex coronary disease with drug eluting stents placed within the last 6-9 months requiring dual antiplatelet therapy as well as pulmonary edema, chronic kidney disease and atrial fibrillation. Poor oral anticoagulation was recently discontinued when it was supratherapeutic and the patient had GI bleeding. By her description, her GI bleeding did not stop after the last hospitalization. She describes the stool as mostly black, but at times burgundy. Abigail [name] denies abdominal pain, but admits that her appetite is decreased. She has a severe anemia of acute GI blood loss from an unclear source in the digestive tract. I am concerned that it may be an upper GI bleed, so I'm planning to do an upper endoscopy today. She is familiar with the procedure, having had it done as recently as January of this year. I again reviewed the procedure in detail along with the risks and benefits and she is agreeable. We are waiting for her to start her third unit of PRBCs per the request of anesthesia services. Fortunately, she has a normal blood pressure and heart rate right now. She has mild pulmonary edema with crackles at the bases bilaterally, but is breathing comfortably on minimal supplemental oxygen. She describes chest pain but thus far has no evidence of acute coronary syndrome. Troponins have been negative.  If upper endoscopy does not localize the source of bleeding, she will most likely require  colonoscopy tomorrow. I have discussed this with her and she is agreeable. Naturally, we need to keep her off antiplatelet therapy right now as the risks outweigh the benefits, even with drug eluting coronary stent placement earlier this year.  Patient at increased risk for cardiopulmonary complications of procedure due to medical comorbidities.     Jessia Kief L Danis III Pager [phone]  Mon-Fri 8a-5p [phone] after 5p, weekends, holidays     

## 2017-09-03 NOTE — ED Notes (Signed)
Blood restarted at rate of 300 ml/hr

## 2017-09-03 NOTE — ED Provider Notes (Addendum)
TIME SEEN: 5:00 AM  CHIEF COMPLAINT: Chest pain and shortness of breath  HPI: Patient is a 71 year old female with history of CAD status post stent in March and then again in May followed by Dr. Doylene Canard currently on Brilinta, previous left lower extremity DVT no longer on anticoagulation, recent admission to the hospital for HCAP and melena who presents to the emergency department with complaints of intermittent left-sided chest pain that is a dull pain for the past 2 weeks.  Pain started back around 2:00 this morning and was more severe.  No radiation of pain.  States it does feel similar to her anginal equivalent.  She has felt short of breath but no nausea, vomiting, dizziness or diaphoresis.  She states it is not exertional but it is pleuritic.  She denies history of PE.  States that she was recently admitted at the end of October for pneumonia.  She denies any known fever but has had cough with yellow sputum.  Reports finishing her antibiotics a week ago.  She reports that she has had black stool since discharge.  She is on iron tablets.  She states that she saw her cardiologist yesterday and he increased her [place].  Her GI physician is Dr. Hilarie Fredrickson.  States her last colonoscopy was in 2016.  PCP - Dr. Jeanie Cooks Cardiologist - Dr. Doylene Canard GI - [place]  ROS: See HPI Constitutional: no fever  Eyes: no drainage  ENT: no runny nose   Cardiovascular:   chest pain  Resp:  SOB  GI: no vomiting GU: no dysuria Integumentary: no rash  Allergy: no hives  Musculoskeletal: no leg swelling  Neurological: no slurred speech ROS otherwise negative  PAST MEDICAL HISTORY/PAST SURGICAL HISTORY:  Past Medical History:  Diagnosis Date  . ACS (acute coronary syndrome) ([place]) 06/2017  . Anemia    "off and on all my life" (12/22/2016)  . Anxiety   . Bilateral renal artery stenosis ([place]) 1999   s/p stenting  . Chronic kidney disease (CKD), stage III (moderate) ([place])    Archie Endo 12/22/2016  . Coronary artery  disease 1999  . Depression   . Diverticulosis   . Duodenitis   . DVT (deep venous thrombosis) ([facility])    on Coumadin  . Dyspnea   . Gastric erosions   . Gastric ulcer   . GERD (gastroesophageal reflux disease)   . Heart murmur   . History of blood transfusion ~ 03/2016   "low HgB; practically nonexistent"  . Hyperlipidemia   . Hypertension   . MI (myocardial infarction) ([place]) 1999  . Multiple myeloma ([place]) dx'd ~ 04/2016  . PAT (paroxysmal atrial tachycardia) ([place]) 12/22/2016  . Retinal hemorrhage, right eye   . Schatzki's ring   . Tachy-brady syndrome ([place])    Archie Endo 12/22/2016  . Tubular adenoma of colon     MEDICATIONS:  Prior to Admission medications   Medication Sig Start Date End Date Taking? Authorizing Provider  acetaminophen (TYLENOL) 500 MG tablet Take 1,000 mg by mouth every 6 (six) hours as needed for mild pain or headache.    [provider]  acyclovir (ZOVIRAX) 400 MG tablet TAKE 1 TABLET (400 MG TOTAL) BY MOUTH 2 (TWO) TIMES DAILY. 08/29/16   Ladell Pier, MD  amiodarone (PACERONE) 200 MG tablet Take 0.5 tablets (100 mg total) by mouth daily. 03/12/17   [name], MD  Ascorbic Acid (VITAMIN C) 1000 MG tablet Take 1,000 mg by mouth daily.     [provider]  aspirin  EC 81 MG tablet Take 1 tablet (81 mg total) by mouth daily. 08/21/17   Bonnielee Haff, MD  atorvastatin (LIPITOR) 80 MG tablet Take 1 tablet (80 mg total) by mouth daily at 6 PM. 03/01/17   [name], MD  calcium carbonate (OS-CAL - DOSED IN MG OF ELEMENTAL CALCIUM) 1250 (500 Ca) MG tablet Take 1 tablet by mouth daily with breakfast.    [provider]  Cholecalciferol (VITAMIN D PO) Take 1 capsule by mouth daily.    [provider]  diclofenac sodium (VOLTAREN) 1 % GEL Apply 2 g topically 4 (four) times daily. 08/21/17   Bonnielee Haff, MD  ferrous sulfate 325 (65 FE) MG tablet Take 1 tablet (325 mg total) by mouth daily with breakfast. 03/02/17   [name],  MD  gabapentin (NEURONTIN) 100 MG capsule Take 1 capsule (100 mg total) by mouth 3 (three) times daily. 08/21/17   Bonnielee Haff, MD  nitroGLYCERIN (NITROSTAT) 0.4 MG SL tablet Place 0.4 mg under the tongue every 5 (five) minutes as needed for chest pain. 03/20/17   [provider]  pantoprazole (PROTONIX) 40 MG tablet Take 1 tablet (40 mg total) by mouth daily. 03/12/17   [name], MD  pyridostigmine (MESTINON) 60 MG tablet Take 0.5 tablets (30 mg total) by mouth 2 (two) times daily. 01/25/17   [name], MD  ticagrelor (BRILINTA) 90 MG TABS tablet Take 1 tablet (90 mg total) by mouth 2 (two) times daily. 12/27/16   [name], MD  traMADol (ULTRAM) 50 MG tablet Take 1 tablet (50 mg total) by mouth every 6 (six) hours as needed for moderate pain. 08/21/17   Bonnielee Haff, MD    ALLERGIES:  Allergies  Allergen Reactions  . Sulfa Antibiotics Rash    SOCIAL HISTORY:  Social History   Tobacco Use  . Smoking status: Former Smoker    Packs/day: 0.50    Years: 50.00    Pack years: 25.00    Types: Cigarettes    Last attempt to quit: 11/20/2016    Years since quitting: 0.7  . Smokeless tobacco: Never Used  Substance Use Topics  . Alcohol use: No    Alcohol/week: 0.0 oz    FAMILY HISTORY: Family History  Problem Relation Age of Onset  . Breast cancer Mother   . Breast cancer Sister   . Breast cancer Maternal Aunt   . Colon cancer Neg Hx     EXAM: BP (!) 153/67 (BP Location: Right Arm)   Pulse (!) 55   Temp 98.8 F (37.1 C) (Oral)   Resp 15   SpO2 99%  CONSTITUTIONAL: Alert and oriented and responds appropriately to questions.  Elderly.  Rectal temp is 99.6.  Appears pale. HEAD: Normocephalic EYES: Conjunctivae clear, pupils appear equal, EOMI, pale conjunctive a ENT: normal nose; moist mucous membranes NECK: Supple, no meningismus, no nuchal rigidity, no LAD  CARD: RRR; S1 and S2 appreciated; no murmurs, no clicks, no rubs, no gallops CHEST:  Chest wall is  nontender to palpation.  No crepitus, ecchymosis, erythema, warmth, rash or other lesions present.   RESP: Normal chest excursion without splinting or tachypnea; breath sounds clear and equal bilaterally; no wheezes, no rhonchi, no rales, no hypoxia or respiratory distress, speaking full sentences ABD/GI: Normal bowel sounds; non-distended; soft, non-tender, no rebound, no guarding, no peritoneal signs, no hepatosplenomegaly RECTAL:  Normal rectal tone, no gross blood, patient has melena and is strongly guaiac positive, no hemorrhoids appreciated, nontender rectal exam, no fecal  impaction BACK:  The back appears normal and is non-tender to palpation, there is no CVA tenderness EXT: Normal ROM in all joints; non-tender to palpation; no edema; normal capillary refill; no cyanosis, no calf tenderness or swelling    SKIN: Normal color for age and race; warm; no rash NEURO: Moves all extremities equally PSYCH: The patient's mood and manner are appropriate. Grooming and personal hygiene are appropriate.  MEDICAL DECISION MAKING: Patient here with chest pain and shortness of breath.  Differential diagnosis includes ACS, PE, pneumonia, symptomatic anemia.  She is strongly guaiac positive here with black stool.  Abdominal exam benign.  Lungs are clear with no hypoxia.  She is hemodynamically stable.  Will obtain labs, chest x-ray but also feel she will need a CT of her chest because she has high risk for a PE and she describes her pain is pleuritic in nature.  Will give fentanyl for pain control.  ED PROGRESS: Patient has a hemoglobin of 5.9.  Again she is hemodynamically stable and I suspect slow decrease over a period of several weeks.  We will give her 3 units of packed red blood cells in the emergency department.  Platelets normal.  She is not coagulopathic.  Chest x-ray suggest mild edema.  CT pending.  She will need admission.   Patient's BNP is elevated to greater than 1000.  Will give 40 of IV  Lasix.   6:34 AM Discussed patient's case with hospitalist, Dr. Blaine Hamper.  I have recommended admission and patient (and family if present) agree with this plan. Admitting physician will place admission orders.   I reviewed all nursing notes, vitals, pertinent previous records, EKGs, lab and urine results, imaging (as available).  CTA shows no PE.  It does again redemonstrates pulmonary edema.  She is receiving diuresis.  Hospitalist has also ordered Protonix bolus and infusion given concern for upper GI bleed.   EKG Interpretation  Date/Time:  Thursday September 03 2017 04:30:12 EST Ventricular Rate:  70 PR Interval:    QRS Duration: 92 QT Interval:  466 QTC Calculation: 503 R Axis:   103 Text Interpretation:  Right and left arm electrode reversal, interpretation assumes no reversal Sinus rhythm Supraventricular bigeminy Right axis deviation Anteroseptal infarct, old Abnormal T, consider ischemia, lateral leads Prolonged QT interval Baseline wander in lead(s) I II aVR Confirmed by Pryor Curia [id number]) on 09/03/2017 4:41:07 AM        EKG Interpretation  Date/Time:  Thursday September 03 2017 04:57:42 EST Ventricular Rate:  65 PR Interval:    QRS Duration: 87 QT Interval:  483 QTC Calculation: 503 R Axis:   104 Text Interpretation:  NSR with prematire supraventricular complexes Right axis deviation Low voltage, precordial leads Anteroseptal infarct, old Nonspecific T abnormalities, lateral leads Prolonged QT interval Confirmed by Pryor Curia [phone]) on 09/03/2017 5:28:07 AM        CRITICAL CARE Performed by: Nyra Jabs   Total critical care time: 45 minutes  Critical care time was exclusive of separately billable procedures and treating other patients.  Critical care was necessary to treat or prevent imminent or life-threatening deterioration.  Critical care was time spent personally by me on the following activities: development of treatment plan with patient and/or  surrogate as well as nursing, discussions with consultants, evaluation of patient's response to treatment, examination of patient, obtaining history from patient or surrogate, ordering and performing treatments and interventions, ordering and review of laboratory studies, ordering and review of radiographic studies, pulse oximetry and  re-evaluation of patient's condition.      Ismeal Heider, Delice Bison, DO 09/03/17 [place], Delice Bison, DO 09/03/17 [place], Delice Bison, DO 09/03/17 [phone]

## 2017-09-03 NOTE — Progress Notes (Signed)
Admission from endoscopy by wheelchair, awake and alert.

## 2017-09-03 NOTE — ED Notes (Signed)
Blood administration - rate increased to 250 ml/hr. No suspected transfusion reaction.

## 2017-09-03 NOTE — ED Notes (Signed)
IV team at bedside 

## 2017-09-03 NOTE — ED Notes (Signed)
X-ray at bedside

## 2017-09-03 NOTE — H&P (Signed)
History and Physical    Abigail [name] QTM:[id number] DOB: 05-29-1946 DOA: 09/03/2017   PCP: Nolene Ebbs, MD   Attending physician: Manuella Ghazi  Patient coming from/Resides with: Private residence/living with sister since recent hospitalization  Chief Complaint: Melena and chest pain  HPI: Abigail [name] is a 71 y.o. female with medical history significant for chronic systolic heart failure with pulmonary hypertension, orthostatic hypotension, multiple myeloma in remission, stage III chronic kidney disease, known CAD with recent DES placed in March and May 2018, history of DVT with recent discontinuation of warfarin, hypertension and dyslipidemia.  The patient was just discharged on 11/2 after an admission for HCAP and symptomatic anemia secondary to GI bleeding.  After confirming no current DVT her warfarin was discontinued noting at presentation her INR supratherapeutic.  Her aspirin and Brilinta were initially placed on hold but were resumed prior to discharge.  She also had an episode of acute systolic heart failure and was given IV Lasix but cardiology recommended holding Lasix at time of discharge.  She was discharged on Augmentin and has completed this medication.  Since discharge the patient reports she has had persistent dark red stools up to 3-4/day.  She has not had any abdominal pain, nausea, vomiting.  She has had waxing and waning chest pain especially with ambulation. Because of the persistent bleeding symptoms patient notify Dr. Merrilee Jansky office yesterday and her Brilinta was decreased from 90 mg to 60 mg.  Because of chest pain and ongoing bleeding patient presented to the ER for further evaluation.  Her hemoglobin was noted to be 5.9; in comparison her hemoglobin was 8.9 on date of discharge on 11/2.  She had an elevated BNP and her chest x-ray was consistent with vascular congestion and mild interstitial edema.  CTA of chest revealed no pulmonary emboli but did confirm mild pulmonary  edema.  ED Course:  Vital Signs: BP (!) 161/63   Pulse 64   Temp 98.8 F (37.1 C) (Oral)   Resp (!) 21   SpO2 99%  Chest x-ray: As above CTA chest: As above Lab data: Sodium 141, potassium 3.8, chloride 114, CO2 20, glucose 99, BUN 10, creatinine 1.03, calcium 8.3, BNP 1024, poc troponin 0 0.01, white count 8100, differential not obtained, hemoglobin 8.9, hematocrit 18.8, platelets 235,000, coags normal, FOB positive. Medications and treatments: Fentanyl 50 mcg IV x1, Lasix 40 mg IV x1, PRBCs ordered but not yet transfused due to patient having need for radiated blood secondary to antibiotics  Review of Systems:  In addition to the HPI above,  No Fever-chills, myalgias or other constitutional symptoms No Headache, changes with Vision or hearing, new weakness, tingling, numbness in any extremity, dizziness, dysarthria or word finding difficulty, gait disturbance or imbalance, tremors or seizure activity No problems swallowing food or Liquids, indigestion/reflux, choking or coughing while eating, abdominal pain with or after eating No Cough, palpitations, orthopnea  No Abdominal pain, N/V, constipation No dysuria, malodorous urine, hematuria or flank pain No new skin rashes, lesions, masses or bruises, No new joint pains, aches, swelling or redness No recent unintentional weight gain or loss No polyuria, polydypsia or polyphagia   Past Medical History:  Diagnosis Date  . ACS (acute coronary syndrome) ([place]) 06/2017  . Anemia    "off and on all my life" (12/22/2016)  . Anxiety   . Bilateral renal artery stenosis ([place]) 1999   s/p stenting  . Chronic kidney disease (CKD), stage III (moderate) ([place])    Archie Endo 12/22/2016  .  Coronary artery disease 1999  . Depression   . Diverticulosis   . Duodenitis   . DVT (deep venous thrombosis) ([facility])    on Coumadin  . Dyspnea   . Gastric erosions   . Gastric ulcer   . GERD (gastroesophageal reflux disease)   . Heart murmur   . History of  blood transfusion ~ 03/2016   "low HgB; practically nonexistent"  . Hyperlipidemia   . Hypertension   . MI (myocardial infarction) ([place]) 1999  . Multiple myeloma ([place]) dx'd ~ 04/2016  . PAT (paroxysmal atrial tachycardia) ([place]) 12/22/2016  . Retinal hemorrhage, right eye   . Schatzki's ring   . Tachy-brady syndrome ([place])    Archie Endo 12/22/2016  . Tubular adenoma of colon     Past Surgical History:  Procedure Laterality Date  . APPENDECTOMY    . BACK SURGERY    . CATARACT EXTRACTION W/ INTRAOCULAR LENS IMPLANT Right 2014  . CORONARY ANGIOGRAPHY N/A 01/23/2017   Procedure: Coronary Angiography;  Surgeon: [name], MD;  Location: [facility];  Service: Cardiovascular;  Laterality: N/A;  . CORONARY ANGIOPLASTY WITH STENT PLACEMENT  1999  . CORONARY STENT INTERVENTION N/A 12/29/2016   Procedure: Coronary Stent Intervention;  Surgeon: Charolette Forward, MD;  Location: [facility];  Service: Cardiovascular;  Laterality: N/A;  . CORONARY STENT INTERVENTION N/A 02/24/2017   Procedure: Coronary Stent Intervention;  Surgeon: Charolette Forward, MD;  Location: [facility];  Service: Cardiovascular;  Laterality: N/A;  . ESOPHAGOGASTRODUODENOSCOPY N/A 11/03/2015   Procedure: ESOPHAGOGASTRODUODENOSCOPY (EGD);  Surgeon: Carol Ada, MD;  Location: [facility];  Service: Endoscopy;  Laterality: N/A;  . ESOPHAGOGASTRODUODENOSCOPY (EGD) WITH PROPOFOL N/A 11/18/2016   Procedure: ESOPHAGOGASTRODUODENOSCOPY (EGD) WITH PROPOFOL;  Surgeon: Ladene Artist, MD;  Location: [facility];  Service: Endoscopy;  Laterality: N/A;  . LEFT HEART CATH AND CORONARY ANGIOGRAPHY N/A 12/24/2016   Procedure: Left Heart Cath and Coronary Angiography;  Surgeon: [name], MD;  Location: [facility];  Service: Cardiovascular;  Laterality: N/A;  . LEFT HEART CATH AND CORONARY ANGIOGRAPHY N/A 02/24/2017   Procedure: Left Heart Cath and Coronary Angiography;  Surgeon: Charolette Forward, MD;  Location: [facility] [facility]  [facility];  Service: Cardiovascular;  Laterality: N/A;  . LEFT HEART CATH AND CORONARY ANGIOGRAPHY N/A 03/05/2017   Procedure: Left Heart Cath and Coronary Angiography;  Surgeon: [name], MD;  Location: [place] [facility];  Service: Cardiovascular;  Laterality: N/A;  . LUMBAR [place]    . RENAL ARTERY STENT  1999   bil RAS so ? bil vs unilateral stents  . RETINAL LASER PROCEDURE Right 2012   "bleeding"  . TONSILLECTOMY    . TOTAL ABDOMINAL HYSTERECTOMY      Social History   Socioeconomic History  . Marital status: Single    Spouse name: Not on file  . Number of children: Not on file  . Years of education: Not on file  . Highest education level: Not on file  Social Needs  . Financial resource strain: Not on file  . Food insecurity - worry: Not on file  . Food insecurity - inability: Not on file  . Transportation needs - medical: Not on file  . Transportation needs - non-medical: Not on file  Occupational History  . Not on file  Tobacco Use  . Smoking status: Former Smoker    Packs/day: 0.50    Years: 50.00    Pack years: 25.00    Types: Cigarettes  Last attempt to quit: 11/20/2016    Years since quitting: 0.7  . Smokeless tobacco: Never Used  Substance and Sexual Activity  . Alcohol use: No    Alcohol/week: 0.0 oz  . Drug use: No  . Sexual activity: No  Other Topics Concern  . Not on file  Social History Narrative  . Not on file    Mobility: Independent Work history: Not obtained   Allergies  Allergen Reactions  . Sulfa Antibiotics Rash    Family History  Problem Relation Age of Onset  . Breast cancer Mother   . Breast cancer Sister   . Breast cancer Maternal Aunt   . Colon cancer Neg Hx      Prior to Admission medications   Medication Sig Start Date End Date Taking? Authorizing Provider  acetaminophen (TYLENOL) 500 MG tablet Take 1,000 mg by mouth every 6 (six) hours as needed for mild pain or headache.   Yes [provider]    amiodarone (PACERONE) 200 MG tablet Take 0.5 tablets (100 mg total) by mouth daily. 03/12/17  Yes [name], MD  Ascorbic Acid (VITAMIN C) 1000 MG tablet Take 1,000 mg by mouth daily.    Yes [provider]  aspirin EC 81 MG tablet Take 1 tablet (81 mg total) by mouth daily. 08/21/17  Yes Bonnielee Haff, MD  atorvastatin (LIPITOR) 80 MG tablet Take 1 tablet (80 mg total) by mouth daily at 6 PM. 03/01/17  Yes [name], MD  calcium carbonate (OS-CAL - DOSED IN MG OF ELEMENTAL CALCIUM) 1250 (500 Ca) MG tablet Take 1 tablet by mouth daily with breakfast.   Yes [provider]  Cholecalciferol (VITAMIN D PO) Take 1 capsule by mouth daily.   Yes [provider]  diclofenac sodium (VOLTAREN) 1 % GEL Apply 2 g topically 4 (four) times daily. 08/21/17  Yes Bonnielee Haff, MD  ferrous sulfate 325 (65 FE) MG tablet Take 1 tablet (325 mg total) by mouth daily with breakfast. 03/02/17  Yes [name], MD  gabapentin (NEURONTIN) 100 MG capsule Take 1 capsule (100 mg total) by mouth 3 (three) times daily. 08/21/17  Yes Bonnielee Haff, MD  nitroGLYCERIN (NITROSTAT) 0.4 MG SL tablet Place 0.4 mg under the tongue every 5 (five) minutes as needed for chest pain. 03/20/17  Yes [provider]  pantoprazole (PROTONIX) 40 MG tablet Take 1 tablet (40 mg total) by mouth daily. 03/12/17  Yes [name], MD  pyridostigmine (MESTINON) 60 MG tablet Take 0.5 tablets (30 mg total) by mouth 2 (two) times daily. 01/25/17  Yes [name], MD  ticagrelor (BRILINTA) 60 MG TABS tablet Take 60 mg 2 (two) times daily by mouth.   Yes [provider]  traMADol (ULTRAM) 50 MG tablet Take 1 tablet (50 mg total) by mouth every 6 (six) hours as needed for moderate pain. 08/21/17  Yes Bonnielee Haff, MD  acyclovir (ZOVIRAX) 400 MG tablet TAKE 1 TABLET (400 MG TOTAL) BY MOUTH 2 (TWO) TIMES DAILY. Patient not taking: Reported on 09/03/2017 08/29/16   Ladell Pier, MD  ticagrelor  (BRILINTA) 90 MG TABS tablet Take 1 tablet (90 mg total) by mouth 2 (two) times daily. Patient not taking: Reported on 09/03/2017 12/27/16   [name], MD    Physical Exam: Vitals:   09/03/17 0430 09/03/17 0432 09/03/17 0637 09/03/17 0700  BP: (!) 153/67 (!) 153/67 (!) 176/69 (!) 161/63  Pulse: 65 (!) 55 72 64  Resp: '19 15 16 ' (!) 21  Temp:  98.8 F (37.1 C)    TempSrc:  Oral    SpO2: 97% 99% 96% 99%      Constitutional: NAD, calm, comfortable Eyes: PERRL, lids normal; conjunctivae pale bilaterally ENMT: Mucous membranes are moist. Posterior pharynx clear of any exudate or lesions.age-appropriate dentition.  Neck: normal, supple, no masses, no thyromegaly Respiratory: Coarse bilateral crackles mid fields down posteriorly. Normal respiratory effort without accessory muscle use rest. RA Cardiovascular: Slightly irregular rate and rhythm, no murmurs / rubs / gallops.  Trace bilateral lower extremity edema. 2+ pedal pulses. No carotid bruits.  Abdomen: no tenderness, no masses palpated. No hepatosplenomegaly. Bowel sounds positive.  Musculoskeletal: no clubbing / cyanosis. No joint deformity upper and lower extremities. Good ROM, no contractures. Normal muscle tone.  Skin: no rashes, lesions, ulcers. No induration Neurologic: CN 2-12 grossly intact. Sensation intact, DTR normal. Strength 5/5 x all 4 extremities.  Psychiatric: Normal judgment and insight. Alert and oriented x 3. Normal mood.    Labs on Admission: I have personally reviewed following labs and imaging studies  CBC: Recent Labs  Lab 09/03/17 0449  WBC 8.1  HGB 5.9*  HCT 18.8*  MCV 94.0  PLT 191   Basic Metabolic Panel: Recent Labs  Lab 09/03/17 0449  NA 141  K 3.8  CL 114*  CO2 20*  GLUCOSE 99  BUN 10  CREATININE 1.01*  CALCIUM 8.3*   GFR: CrCl cannot be calculated (Unknown ideal weight.). Liver Function Tests: No results for input(s): AST, ALT, ALKPHOS, BILITOT, PROT, ALBUMIN in the last 168  hours. No results for input(s): LIPASE, AMYLASE in the last 168 hours. No results for input(s): AMMONIA in the last 168 hours. Coagulation Profile: Recent Labs  Lab 09/03/17 0449  INR 1.04   Cardiac Enzymes: No results for input(s): CKTOTAL, CKMB, CKMBINDEX, TROPONINI in the last 168 hours. BNP (last 3 results) No results for input(s): PROBNP in the last 8760 hours. HbA1C: No results for input(s): HGBA1C in the last 72 hours. CBG: No results for input(s): GLUCAP in the last 168 hours. Lipid Profile: No results for input(s): CHOL, HDL, LDLCALC, TRIG, CHOLHDL, LDLDIRECT in the last 72 hours. Thyroid Function Tests: No results for input(s): TSH, T4TOTAL, FREET4, T3FREE, THYROIDAB in the last 72 hours. Anemia Panel: No results for input(s): VITAMINB12, FOLATE, FERRITIN, TIBC, IRON, RETICCTPCT in the last 72 hours. Urine analysis:    Component Value Date/Time   COLORURINE YELLOW 08/16/2017 1656   APPEARANCEUR CLEAR 08/16/2017 1656   LABSPEC 1.018 08/16/2017 1656   LABSPEC 1.010 10/30/2016 1522   PHURINE 5.0 08/16/2017 1656   GLUCOSEU NEGATIVE 08/16/2017 1656   GLUCOSEU Negative 10/30/2016 1522   HGBUR MODERATE (A) 08/16/2017 1656   BILIRUBINUR NEGATIVE 08/16/2017 1656   BILIRUBINUR Negative 10/30/2016 [place] 08/16/2017 1656   PROTEINUR NEGATIVE 08/16/2017 1656   UROBILINOGEN 0.2 10/30/2016 1522   NITRITE NEGATIVE 08/16/2017 1656   LEUKOCYTESUR NEGATIVE 08/16/2017 1656   LEUKOCYTESUR Trace 10/30/2016 1522   Sepsis Labs: '@LABRCNTIP' (procalcitonin:4,lacticidven:4) )No results found for this or any previous visit (from the past 240 hour(s)).   Radiological Exams on Admission: Dg Chest 2 View  Result Date: 09/03/2017 CLINICAL DATA:  Acute onset of shortness of breath and generalized chest pain. EXAM: CHEST  2 VIEW COMPARISON:  Chest radiograph performed 08/16/2017 FINDINGS: The lungs are well-aerated. Minimal bibasilar opacities may reflect atelectasis or  mild interstitial edema. Vascular congestion is noted. There is no evidence of focal opacification, pleural effusion or pneumothorax. The heart  is borderline normal in size. No acute osseous abnormalities are seen. IMPRESSION: Vascular congestion noted. Minimal bibasilar opacities may reflect atelectasis or mild interstitial edema. Electronically Signed   By: Garald Balding M.D.   On: 09/03/2017 05:43   Ct Angio Chest Pe W And/or Wo Contrast  Result Date: 09/03/2017 CLINICAL DATA:  Subacute onset of intermittent left-sided chest pain. EXAM: CT ANGIOGRAPHY CHEST WITH CONTRAST TECHNIQUE: Multidetector CT imaging of the chest was performed using the standard protocol during bolus administration of intravenous contrast. Multiplanar CT image reconstructions and MIPs were obtained to evaluate the vascular anatomy. CONTRAST:  165m ISOVUE-370 IOPAMIDOL (ISOVUE-370) INJECTION 76% COMPARISON:  Chest radiograph performed earlier today at 5:05 a.m., and CTA of the chest performed 07/24/2015 FINDINGS: Cardiovascular:  There is no evidence of pulmonary embolus. The heart is borderline normal in size. Diffuse coronary artery calcifications are seen. Mild calcification is seen along the thoracic aorta and proximal great vessels. Mediastinum/Nodes: There is nonspecific wall thickening along the mainstem bronchi bilaterally, which may reflect chronic inflammation. No definite mediastinal lymphadenopathy is seen. No pericardial effusion is identified. The thyroid gland is unremarkable in appearance. No axillary lymphadenopathy is appreciated. Lungs/Pleura: Trace bilateral pleural fluid is noted. Diffuse interstitial prominence is noted at the lung bases, with hazy opacities, likely reflecting mild interstitial edema. Diffuse peribronchial thickening is noted bilaterally. No pneumothorax is seen.  No dominant mass is seen. Upper Abdomen: The visualized portions of the liver and spleen are unremarkable. The visualized portions of  the gallbladder, pancreas, adrenal glands and kidneys are within normal limits, aside from a left renal cyst. Bilateral renal artery stents are noted. Musculoskeletal: No acute osseous abnormalities are identified. There is grade 1 retrolisthesis of T12 on L1 and of L1 on L2. The visualized musculature is unremarkable in appearance. Review of the MIP images confirms the above findings. IMPRESSION: 1. No evidence of pulmonary embolus. 2. Trace bilateral pleural fluid. Diffuse interstitial prominence at the lung bases, with hazy opacities, likely reflecting mild pulmonary edema. 3. Diffuse peribronchial thickening noted bilaterally. Nonspecific wall thickening along the mainstem bronchi bilaterally. This may reflect chronic inflammation. 4. Diffuse coronary artery calcifications noted. 5. Left renal cyst. 6. Grade 1 retrolisthesis of T12 on L1 and of L1 on L2. Electronically Signed   By: JGarald BaldingM.D.   On: 09/03/2017 06:49    EKG: (Independently reviewed) sinus rhythm and infrequent PACs versus sinus arrhythmia with ventricular rate 65 bpm, QTC 503 ms, normal R wave rotation, right axis deviation versus incomplete right bundle branch block, no acute ischemic changes  Assessment/Plan Principal Problem:   GIB (gastrointestinal bleeding) -Patient presents with melanotic stools that are heme positive,not associated with abdominal pain -Just discharged 11/2 after an admission for multiple problems including GI bleeding presumed to be diverticular in nature in the context of supratherapeutic INR -No longer on warfarin; Brilinta and aspirin resumed prior to discharge with recent dose decrease on 11/14 by cardiologist-hold both antiplatelet agents -NPO except for ice chips and sips with medications -Consult gastroenterology -History of GERD, gastric ulcer and gastric erosions so we will continue Protonix infusion started by EDP; also has a history of tubular adenoma: -EGD on 11/18/16 by Dr. SFuller Plan Findings  included LA grade D esophagitis without bleeding as well as intrinsic stenosis at the GE junction and erythematous duodenopathy--treatment included PPI twice daily for 2 months -Bleeding not brisk enough to warrant nuclear med bleeding scan -No Zofran or Phenergan if develops nausea secondary to prolonged QTC  Active Problems:  Symptomatic anemia -Patient presents with shortness of breath, heart failure exacerbation and chest pain in the context of decreased hemoglobin of 5.9 -Hemoglobin was 8.9 at time of discharge on 11/2 -3 units PRBCs have been ordered but patient requires a radiated blood secondary to antibody so infusion has not started -Repeat CBC after blood products infused and again in a.m.; if develops more brisk bleeding will need to repeat CBC sooner    Acute systolic heart failure  -Presents with dyspnea/shortness of breath, mild lower extremity edema, elevated BNP and abnormal chest x-ray consistent with acute heart failure exacerbation -Likely precipitated by anemia -Treat underlying causes as above -Lasix 40 mg IV every 12 hours (Lasix held at discharge likely in context of known orthostatic hypotension history) -Early weights, strict I/O -Last echocardiogram was on 10/31: EF 40-45%, severe hypokinesis of mid apical anterior and apical myocardium in the distribution of the LAD, right ventricular systolic pressure increased consistent with moderate pulmonary hypertension with PA P 50 mmHg -No ACE inhibitor/ARB secondary to chronic kidney disease -Was not on beta-blocker prior to admission noting was on amiodarone-also acute systolic heart failure exacerbation at present therefore no beta-blocker    Chronic kidney disease (CKD), stage III (moderate)  -Renal function stable and at baseline    Coronary artery disease -Presents with chest pain in the context of symptomatic anemia with initial poc opponent and EKG unremarkable -s/p DES to LAD and left circumflex with most recent  being in May 2018 -Holding Brilinta and aspirin in context of acute GI bleeding -Consult cardiology for input -IV morphine and SL NTG for chest pain -Treat underlying causes i.e. transfuse blood -Cycle troponin as precaution     H/O PAT -Currently maintaining sinus rhythm with PACs on amiodarone -QTC slightly prolonged    Hyperlipidemia -Hold statin while NPO    Hypertension -Was not on antihypertensive medications prior to admission -Anticipate normalization of blood pressure with treatment of heart failure/Lasix -Hydralazine IV prn    Peripheral neuropathy -Continue Neurontin    H/O orthostatic hypotension -Continue Mestinon    Multiple myeloma in remission ([place])      DVT prophylaxis: SCDs Code Status: Full Family Communication: No family at bedside Disposition Plan: Home Consults called: Cardiology/[place]; Toniann Fail ANP-BC [facility] Pager [phone]   If 7PM-7AM, please contact night-coverage [email or web address] Password [facility]  09/03/2017, 7:28 AM

## 2017-09-04 ENCOUNTER — Inpatient Hospital Stay (HOSPITAL_COMMUNITY): Payer: Medicare HMO | Admitting: Certified Registered"

## 2017-09-04 ENCOUNTER — Other Ambulatory Visit: Payer: Self-pay

## 2017-09-04 ENCOUNTER — Encounter (HOSPITAL_COMMUNITY): Payer: Self-pay | Admitting: *Deleted

## 2017-09-04 ENCOUNTER — Encounter (HOSPITAL_COMMUNITY)
Admission: EM | Disposition: A | Discharge status: Home Health | Payer: Self-pay | Source: Home / Self Care | Attending: Internal Medicine

## 2017-09-04 DIAGNOSIS — I2510 Atherosclerotic heart disease of native coronary artery without angina pectoris: Secondary | ICD-10-CM

## 2017-09-04 DIAGNOSIS — K5731 Diverticulosis of large intestine without perforation or abscess with bleeding: Principal | ICD-10-CM

## 2017-09-04 DIAGNOSIS — K5730 Diverticulosis of large intestine without perforation or abscess without bleeding: Secondary | ICD-10-CM

## 2017-09-04 DIAGNOSIS — K922 Gastrointestinal hemorrhage, unspecified: Secondary | ICD-10-CM

## 2017-09-04 DIAGNOSIS — N183 Chronic kidney disease, stage 3 (moderate): Secondary | ICD-10-CM

## 2017-09-04 DIAGNOSIS — D62 Acute posthemorrhagic anemia: Secondary | ICD-10-CM

## 2017-09-04 DIAGNOSIS — K222 Esophageal obstruction: Secondary | ICD-10-CM

## 2017-09-04 DIAGNOSIS — K921 Melena: Secondary | ICD-10-CM

## 2017-09-04 HISTORY — PX: COLONOSCOPY WITH PROPOFOL: SHX5780

## 2017-09-04 LAB — TYPE AND SCREEN
ABO/RH(D): O POS
ANTIBODY SCREEN: POSITIVE
DONOR AG TYPE: NEGATIVE
Donor AG Type: NEGATIVE
Donor AG Type: NEGATIVE
UNIT DIVISION: 0
Unit division: 0
Unit division: 0

## 2017-09-04 LAB — CBC
HEMATOCRIT: 31.1 % — AB (ref 36.0–46.0)
Hemoglobin: 10.2 g/dL — ABNORMAL LOW (ref 12.0–15.0)
MCH: 29.6 pg (ref 26.0–34.0)
MCHC: 32.8 g/dL (ref 30.0–36.0)
MCV: 90.1 fL (ref 78.0–100.0)
PLATELETS: 233 10*3/uL (ref 150–400)
RBC: 3.45 MIL/uL — ABNORMAL LOW (ref 3.87–5.11)
RDW: 17.3 % — AB (ref 11.5–15.5)
WBC: 6.3 10*3/uL (ref 4.0–10.5)

## 2017-09-04 LAB — BPAM RBC
BLOOD PRODUCT EXPIRATION DATE: 201811212359
Blood Product Expiration Date: 201811212359
Blood Product Expiration Date: 201812012359
ISSUE DATE / TIME: 201811151329
ISSUE DATE / TIME: 201811150746
ISSUE DATE / TIME: 201811151129
UNIT TYPE AND RH: 5100
Unit Type and Rh: 5100
Unit Type and Rh: 5100

## 2017-09-04 LAB — CBC WITH DIFFERENTIAL/PLATELET

## 2017-09-04 LAB — COMPREHENSIVE METABOLIC PANEL
ALBUMIN: 3.1 g/dL — AB (ref 3.5–5.0)
ALT: 9 U/L — AB (ref 14–54)
AST: 15 U/L (ref 15–41)
Alkaline Phosphatase: 71 U/L (ref 38–126)
Anion gap: 9 (ref 5–15)
BUN: 12 mg/dL (ref 6–20)
CHLORIDE: 108 mmol/L (ref 101–111)
CO2: 25 mmol/L (ref 22–32)
CREATININE: 1.09 mg/dL — AB (ref 0.44–1.00)
Calcium: 8.7 mg/dL — ABNORMAL LOW (ref 8.9–10.3)
GFR calc Af Amer: 58 mL/min — ABNORMAL LOW (ref >60)
GFR calc non Af Amer: 50 mL/min — ABNORMAL LOW (ref >60)
GLUCOSE: 90 mg/dL (ref 65–99)
POTASSIUM: 3.2 mmol/L — AB (ref 3.5–5.1)
SODIUM: 142 mmol/L (ref 135–145)
Total Bilirubin: 1.7 mg/dL — ABNORMAL HIGH (ref 0.3–1.2)
Total Protein: 5.6 g/dL — ABNORMAL LOW (ref 6.5–8.1)

## 2017-09-04 LAB — DIFFERENTIAL
BASOS PCT: 0 %
Basophils Absolute: 0 10*3/uL (ref 0.0–0.1)
EOS PCT: 1 %
Eosinophils Absolute: 0.1 10*3/uL (ref 0.0–0.7)
Lymphocytes Relative: 14 %
Lymphs Abs: 0.9 10*3/uL (ref 0.7–4.0)
MONO ABS: 0.7 10*3/uL (ref 0.1–1.0)
Monocytes Relative: 11 %
NEUTROS PCT: 74 %
Neutro Abs: 4.8 10*3/uL (ref 1.7–7.7)

## 2017-09-04 SURGERY — COLONOSCOPY
Anesthesia: Monitor Anesthesia Care

## 2017-09-04 SURGERY — COLONOSCOPY WITH PROPOFOL
Anesthesia: Monitor Anesthesia Care

## 2017-09-04 MED ORDER — PROPOFOL 10 MG/ML IV BOLUS
INTRAVENOUS | Status: DC | PRN
  Administered 2017-09-04: 15 mg via INTRAVENOUS
  Administered 2017-09-04: 20 mg via INTRAVENOUS

## 2017-09-04 MED ORDER — ASPIRIN EC 81 MG PO TBEC
81.0000 mg | DELAYED_RELEASE_TABLET | Freq: Every day | ORAL | Status: DC
  Administered 2017-09-04 – 2017-09-05 (×2): 81 mg via ORAL
  Filled 2017-09-04 (×2): qty 1

## 2017-09-04 MED ORDER — PROPOFOL 500 MG/50ML IV EMUL
INTRAVENOUS | Status: DC | PRN
  Administered 2017-09-04: 50 ug/kg/min via INTRAVENOUS

## 2017-09-04 MED ORDER — EPHEDRINE SULFATE-NACL 50-0.9 MG/10ML-% IV SOSY
PREFILLED_SYRINGE | INTRAVENOUS | Status: DC | PRN
  Administered 2017-09-04: 2.5 mg via INTRAVENOUS

## 2017-09-04 MED ORDER — SODIUM CHLORIDE 0.9 % IV SOLN
INTRAVENOUS | Status: DC | PRN
  Administered 2017-09-04: 08:00:00 via INTRAVENOUS

## 2017-09-04 SURGICAL SUPPLY — 22 items

## 2017-09-04 NOTE — Transfer of Care (Signed)
Immediate Anesthesia Transfer of Care Note  Patient: Abigail [name]  Procedure(s) Performed: COLONOSCOPY WITH PROPOFOL (N/A )  Patient Location: Endoscopy Unit  Anesthesia Type:MAC  Level of Consciousness: drowsy  Airway & Oxygen Therapy: Patient Spontanous Breathing and Patient connected to nasal cannula oxygen  Post-op Assessment: Report given to RN and Post -op Vital signs reviewed and stable  Post vital signs: Reviewed and stable  Last Vitals:  Vitals:   09/04/17 0506 09/04/17 0733  BP: (!) 145/63 (!) 169/75  Pulse: (!) 51   Resp: 11 11  Temp: 36.9 C 36.5 C  SpO2: 100% 100%    Last Pain:  Vitals:   09/04/17 0733  TempSrc: Oral  PainSc:       Patients Stated Pain Goal: 2 (65/[id number]/46 [id number])  Complications: No apparent anesthesia complications

## 2017-09-04 NOTE — Op Note (Signed)
[facility] Patient Name: Abigail [name] Procedure Date : 09/04/2017 MRN: [id number] Attending MD: Estill Cotta. Loletha [name] , MD Date of Birth: [birth year] CSN: [id number] Age: 71 Admit Type: Inpatient Procedure:                Colonoscopy Indications:              Hematochezia, Acute post hemorrhagic anemia Providers:                Mallie Mussel L. Loletha Carrow, MD, Zenon Mayo, RN, Tinnie Gens,                            Technician, Edmonia James, CRNA Referring MD:              Medicines:                Monitored Anesthesia Care Complications:            No immediate complications. Estimated Blood Loss:     Estimated blood loss: none. Procedure:                Pre-Anesthesia Assessment:                           - Prior to the procedure, a History and Physical                            was performed, and patient medications and                            allergies were reviewed. The patient's tolerance of                            previous anesthesia was also reviewed. The risks                            and benefits of the procedure and the sedation                            options and risks were discussed with the patient.                            All questions were answered, and informed consent                            was obtained. Prior Anticoagulants: The patient has                            taken aspirin, last dose was 2 days prior to                            procedure. ASA Grade Assessment: III - A patient                            with severe systemic disease. After reviewing the  risks and benefits, the patient was deemed in                            satisfactory condition to undergo the procedure.                           After obtaining informed consent, the colonoscope                            was passed under direct vision. Throughout the                            procedure, the patient's blood pressure, pulse, and        oxygen saturations were monitored continuously. The                            EC-[id number] ([id number]) scope was introduced through                            the anus and advanced to the the cecum, identified                            by appendiceal orifice and ileocecal valve. The                            colonoscopy was performed without difficulty. The                            patient tolerated the procedure well. The quality                            of the bowel preparation was good. The ileocecal                            valve, appendiceal orifice, and rectum were                            photographed. The quality of the bowel preparation                            was evaluated using the BBPS [facility] Bowel                            Preparation Scale) with scores of: Right Colon = 2,                            Transverse Colon = 2 and Left Colon = 2. The total                            BBPS score equals 6. The bowel preparation used was                            MoviPrep. Scope In:  8:29:06 AM Scope Out: 8:44:25 AM Scope Withdrawal Time: 0 hours 8 minutes 5 seconds  Total Procedure Duration: 0 hours 15 minutes 19 seconds  Findings:      The perianal and digital rectal examinations were normal.      - The terminal ileum could not be intubated due to scope looping.      Diverticula were found in the left colon and right colon.      There was a small acute-appearing superficial tear of the posterior anal       mucosa without stigmata of recent bleeding.      The exam was otherwise without abnormality on direct and retroflexion       views.      There was no old or fresh blood in the colon. Impression:               - Diverticulosis in the left colon and in the right                            colon.                           - Small superficial tear of posterior anal mucosa -                            appears doubtful to have been the cause of bleeding.                            - The examination was otherwise normal on direct                            and retroflexion views.                           - No specimens collected.                           Suspected diverticular bleeding that has now                            stopped.                           If patient develops recurrent bleeding, perform                            careful ano-rectal exam to exclude anal finding                            noted above as the cause. If ruled out, proceed to                            tagged RBC scan or CTA abdomen to localize                            approximate area of bleeding. Moderate Sedation:      MAC sedation used Recommendation:           -  Return patient to hospital ward for ongoing care.                            - Serial hemoglobin and hematocrit no longer needed                            unless recurrent overt bleeding.                           - Resume regular diet.                           - Resume aspirin today and Brilinta in 7 days at                            prior doses.                           - No recommendation at this time regarding repeat                            colonoscopy. Procedure Code(s):        --- Professional ---                           [id number], Colonoscopy, flexible; diagnostic, including                            collection of specimen(s) by brushing or washing,                            when performed (separate procedure) Diagnosis Code(s):        --- Professional ---                           K92.1, Melena (includes Hematochezia)                           D62, Acute posthemorrhagic anemia                           K57.30, Diverticulosis of large intestine without                            perforation or abscess without bleeding CPT copyright 2016 American Medical Association. All rights reserved. The codes documented in this report are preliminary and upon coder review may  be revised to meet current  compliance requirements. Abigail L. Loletha Carrow, MD 09/04/2017 9:02:26 AM This report has been signed electronically. Number of Addenda: 0

## 2017-09-04 NOTE — Anesthesia Postprocedure Evaluation (Signed)
Anesthesia Post Note  Patient: Abigail [name]  Procedure(s) Performed: COLONOSCOPY WITH PROPOFOL (N/A )     Patient location during evaluation: PACU Anesthesia Type: MAC Level of consciousness: awake and alert Pain management: pain level controlled Vital Signs Assessment: post-procedure vital signs reviewed and stable Respiratory status: spontaneous breathing, nonlabored ventilation, respiratory function stable and patient connected to nasal cannula oxygen Cardiovascular status: stable and blood pressure returned to baseline Postop Assessment: no apparent nausea or vomiting Anesthetic complications: no    Last Vitals:  Vitals:   09/04/17 1524 09/04/17 2030  BP: (!) 127/58 122/65  Pulse: (!) 47 (!) 52  Resp: 16 19  Temp: 37.2 C 37.3 C  SpO2: 97% 92%    Last Pain:  Vitals:   09/04/17 2030  TempSrc: Oral  PainSc:                  Jeany Seville

## 2017-09-04 NOTE — Anesthesia Postprocedure Evaluation (Signed)
Anesthesia Post Note  Patient: Abigail [name]  Procedure(s) Performed: ESOPHAGOGASTRODUODENOSCOPY (EGD) (N/A )     Patient location during evaluation: PACU Anesthesia Type: MAC Level of consciousness: awake Pain management: pain level controlled Vital Signs Assessment: post-procedure vital signs reviewed and stable Respiratory status: spontaneous breathing Cardiovascular status: stable Anesthetic complications: no    Last Vitals:  Vitals:   09/04/17 1142 09/04/17 1144  BP: 111/79 127/89  Pulse: (!) 45 97  Resp: 12 17  Temp: 36.9 C   SpO2: 99% 97%    Last Pain:  Vitals:   09/04/17 1142  TempSrc: Oral  PainSc: 0-No pain                 Kaiel Weide

## 2017-09-04 NOTE — Progress Notes (Signed)
PROGRESS NOTE    Abigail [name]  AVW:[id number] DOB: [birth year] DOA: 09/03/2017 PCP: Nolene Ebbs, MD  Outpatient Specialists:   Brief Narrative:  Patient is a 71 y.o. 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 Patient was admitted with rectal bleed. On admission, the Hb was 5.9g/dl, down from 8.9/dl approximately 2 weeks earlier. Patient has history of prior GI bleed. Patient was seen by her Cardiologist on outpatient basis and the dose of Brilanta was decreased from 65m po once daily to 654mpo once daily. Patient was also on coumadin prior to admission, and this is currently on hold. BrEpifania Gores also on hold. Patient has undergone both EGD and colonoscopy. Colonoscopy done earlier today revealed diverticulosis and small superficial tear of posterior anal mucosa. Will repeat H/H today. Will liaise with GI and Cardiology regarding discharge medication etc.  Assessment & Plan:   Principal Problem:   GIB (gastrointestinal bleeding) Active Problems:   Symptomatic anemia   Acute systolic heart failure (HCC)   Chronic kidney disease (CKD), stage III (moderate) (HCC)   Coronary artery disease   Hyperlipidemia   Hypertension   Multiple myeloma in remission ([facility]Branch  Hematochezia   Acute blood loss anemia   Diverticulosis of colon with hemorrhage    - Continue to monitor h/h - Transfuse PRBC PRN - To liaise with GI and Cardiology regarding antiplatelet and anticoagulation management. - Monitor renal function.   DVT prophylaxis: SCD Code Status: Full Family Communication:  Disposition Plan: Home eventually   Consultants:   GI  Cardiology  Procedures:   EGD  Colonoscopy  Antimicrobials:   None    Subjective: No complaints. No SOB, chest pain, fever or chills.  Objective: Vitals:   09/04/17 0506 09/04/17 0733 09/04/17 0853 09/04/17 0902  BP: (!) 145/63 (!)  169/75 (!) 153/63 (!) 165/57  Pulse: (!) 51  (!) 45 (!) 48  Resp: '11 11 17 13  ' Temp: 98.5 F (36.9 C) 97.7 F (36.5 C) 98.6 F (37 C)   TempSrc: Oral Oral Oral   SpO2: 100% 100% 99% 100%  Weight:  71.7 kg (158 lb)    Height:  '5\' 5"'  (1.651 m)      Intake/Output Summary (Last 24 hours) at 09/04/2017 1137 Last data filed at 09/04/2017 0830 Gross per 24 hour  Intake 818.33 ml  Output 702 ml  Net 116.33 ml   Filed Weights   09/03/17 1248 09/03/17 1645 09/04/17 0733  Weight: 72 kg (158 lb 11.7 oz) 72 kg (158 lb 11.7 oz) 71.7 kg (158 lb)    Examination:  General exam: Appears calm and comfortable   HEENT - No pallor. No jaundice. Respiratory system: Clear to auscultation. Respiratory effort normal. Cardiovascular system: S1 & S2. No pedal edema. Gastrointestinal system: Abdomen is nondistended, soft and nontender. No organomegaly or masses felt. Normal bowel sounds heard. Central nervous system: Alert and oriented. No focal neurological deficits. Extremities: No edema. Skin: No rashes, lesions or ulcers noted. Psychiatry: Judgement and insight appear normal. Mood & affect appropriate.    Data Reviewed: I have personally reviewed following labs and imaging studies  CBC: Recent Labs  Lab 09/03/17 0449 09/03/17 1503  WBC 8.1  --   HGB 5.9* 9.9*  HCT 18.8* 29.0*  MCV 94.0  --   PLT 235  --    Basic Metabolic Panel: Recent Labs  Lab 09/03/17 0449 09/03/17  1503  NA 141 144  K 3.8 4.0  CL 114*  --   CO2 20*  --   GLUCOSE 99 91  BUN 10  --   CREATININE 1.01*  --   CALCIUM 8.3*  --    GFR: Estimated Creatinine Clearance: 51.5 mL/min (A) (by C-G formula based on SCr of 1.01 mg/dL (H)). Liver Function Tests: No results for input(s): AST, ALT, ALKPHOS, BILITOT, PROT, ALBUMIN in the last 168 hours. No results for input(s): LIPASE, AMYLASE in the last 168 hours. No results for input(s): AMMONIA in the last 168 hours. Coagulation Profile: Recent Labs  Lab  09/03/17 0449  INR 1.04   Cardiac Enzymes: Recent Labs  Lab 09/03/17 0731 09/03/17 1140  TROPONINI <0.03 <0.03   BNP (last 3 results) No results for input(s): PROBNP in the last 8760 hours. HbA1C: No results for input(s): HGBA1C in the last 72 hours. CBG: No results for input(s): GLUCAP in the last 168 hours. Lipid Profile: No results for input(s): CHOL, HDL, LDLCALC, TRIG, CHOLHDL, LDLDIRECT in the last 72 hours. Thyroid Function Tests: No results for input(s): TSH, T4TOTAL, FREET4, T3FREE, THYROIDAB in the last 72 hours. Anemia Panel: No results for input(s): VITAMINB12, FOLATE, FERRITIN, TIBC, IRON, RETICCTPCT in the last 72 hours. Urine analysis:    Component Value Date/Time   COLORURINE YELLOW 08/16/2017 1656   APPEARANCEUR CLEAR 08/16/2017 1656   LABSPEC 1.018 08/16/2017 1656   LABSPEC 1.010 10/30/2016 1522   PHURINE 5.0 08/16/2017 1656   GLUCOSEU NEGATIVE 08/16/2017 1656   GLUCOSEU Negative 10/30/2016 1522   HGBUR MODERATE (A) 08/16/2017 1656   BILIRUBINUR NEGATIVE 08/16/2017 1656   BILIRUBINUR Negative 10/30/2016 1522   KETONESUR NEGATIVE 08/16/2017 1656   PROTEINUR NEGATIVE 08/16/2017 1656   UROBILINOGEN 0.2 10/30/2016 1522   NITRITE NEGATIVE 08/16/2017 1656   LEUKOCYTESUR NEGATIVE 08/16/2017 1656   LEUKOCYTESUR Trace 10/30/2016 1522   Sepsis Labs: '@LABRCNTIP' (procalcitonin:4,lacticidven:4)  ) Recent Results (from the past 240 hour(s))  MRSA PCR Screening     Status: None   Collection Time: 09/03/17  4:45 PM  Result Value Ref Range Status   MRSA by PCR NEGATIVE NEGATIVE Final    Comment:        The GeneXpert MRSA Assay (FDA approved for NASAL specimens only), is one component of a comprehensive MRSA colonization surveillance program. It is not intended to diagnose MRSA infection nor to guide or monitor treatment for MRSA infections.          Radiology Studies: Dg Chest 2 View  Result Date: 09/03/2017 CLINICAL DATA:  Acute onset of  shortness of breath and generalized chest pain. EXAM: CHEST  2 VIEW COMPARISON:  Chest radiograph performed 08/16/2017 FINDINGS: The lungs are well-aerated. Minimal bibasilar opacities may reflect atelectasis or mild interstitial edema. Vascular congestion is noted. There is no evidence of focal opacification, pleural effusion or pneumothorax. The heart is borderline normal in size. No acute osseous abnormalities are seen. IMPRESSION: Vascular congestion noted. Minimal bibasilar opacities may reflect atelectasis or mild interstitial edema. Electronically Signed   By: Garald Balding M.D.   On: 09/03/2017 05:43   Ct Angio Chest Pe W And/or Wo Contrast  Result Date: 09/03/2017 CLINICAL DATA:  Subacute onset of intermittent left-sided chest pain. EXAM: CT ANGIOGRAPHY CHEST WITH CONTRAST TECHNIQUE: Multidetector CT imaging of the chest was performed using the standard protocol during bolus administration of intravenous contrast. Multiplanar CT image reconstructions and MIPs were obtained to evaluate the vascular anatomy. CONTRAST:  19m ISOVUE-370 IOPAMIDOL (ISOVUE-370)  INJECTION 76% COMPARISON:  Chest radiograph performed earlier today at 5:05 a.m., and CTA of the chest performed 07/24/2015 FINDINGS: Cardiovascular:  There is no evidence of pulmonary embolus. The heart is borderline normal in size. Diffuse coronary artery calcifications are seen. Mild calcification is seen along the thoracic aorta and proximal great vessels. Mediastinum/Nodes: There is nonspecific wall thickening along the mainstem bronchi bilaterally, which may reflect chronic inflammation. No definite mediastinal lymphadenopathy is seen. No pericardial effusion is identified. The thyroid gland is unremarkable in appearance. No axillary lymphadenopathy is appreciated. Lungs/Pleura: Trace bilateral pleural fluid is noted. Diffuse interstitial prominence is noted at the lung bases, with hazy opacities, likely reflecting mild interstitial edema.  Diffuse peribronchial thickening is noted bilaterally. No pneumothorax is seen.  No dominant mass is seen. Upper Abdomen: The visualized portions of the liver and spleen are unremarkable. The visualized portions of the gallbladder, pancreas, adrenal glands and kidneys are within normal limits, aside from a left renal cyst. Bilateral renal artery stents are noted. Musculoskeletal: No acute osseous abnormalities are identified. There is grade 1 retrolisthesis of T12 on L1 and of L1 on L2. The visualized musculature is unremarkable in appearance. Review of the MIP images confirms the above findings. IMPRESSION: 1. No evidence of pulmonary embolus. 2. Trace bilateral pleural fluid. Diffuse interstitial prominence at the lung bases, with hazy opacities, likely reflecting mild pulmonary edema. 3. Diffuse peribronchial thickening noted bilaterally. Nonspecific wall thickening along the mainstem bronchi bilaterally. This may reflect chronic inflammation. 4. Diffuse coronary artery calcifications noted. 5. Left renal cyst. 6. Grade 1 retrolisthesis of T12 on L1 and of L1 on L2. Electronically Signed   By: Garald Balding M.D.   On: 09/03/2017 06:49   Dg Chest Port 1 View  Result Date: 09/03/2017 CLINICAL DATA:  Coronary artery disease. EXAM: PORTABLE CHEST 1 VIEW COMPARISON:  CT 09/03/2017 . FINDINGS: Cardiomegaly with pulmonary vascular prominence and bilateral interstitial prominence. No pleural effusion or pneumothorax. No acute bony abnormality. IMPRESSION: Congestive heart failure with bilateral pulmonary interstitial edema. Electronically Signed   By: Marcello Moores  Register   On: 09/03/2017 09:51        Scheduled Meds: . amiodarone  100 mg Oral Daily  . aspirin EC  81 mg Oral Daily  . furosemide  40 mg Intravenous Q12H  . gabapentin  100 mg Oral TID  . pantoprazole  40 mg Oral Q0600  . pyridostigmine  30 mg Oral BID  . sodium chloride flush  10-40 mL Intracatheter Q12H   Continuous Infusions:   LOS: 1  day    Time spent: 26 Mins    Dana Allan, MD  [facility] Pager #: [phone] 7PM-7AM contact night coverage as above

## 2017-09-04 NOTE — Interval H&P Note (Signed)
History and Physical Interval Note:  09/04/2017 8:19 AM  Abigail [name]  has presented today for surgery, with the diagnosis of gastrointestinal bleeding  The various methods of treatment have been discussed with the patient and family. After consideration of risks, benefits and other options for treatment, the patient has consented to  Procedure(s): COLONOSCOPY WITH PROPOFOL (N/A) as a surgical intervention .  The patient's history has been reviewed, patient examined, no change in status, stable for surgery.  I have reviewed the patient's chart and labs.  Questions were answered to the patient's satisfaction.    Patient stable after transfusion yesterday. Lungs clear to auscultation - breathing comfortably.  Fatigued from illness and bowel prep with poor sleep. Proceed with colonoscopy. Nelida Meuse III

## 2017-09-04 NOTE — Anesthesia Procedure Notes (Signed)
Procedure Name: MAC Performed by: Valda Favia, CRNA Pre-anesthesia Checklist: Patient identified, Emergency Drugs available, Suction available, Timeout performed and Patient being monitored Patient Re-evaluated:Patient Re-evaluated prior to induction Oxygen Delivery Method: Simple face mask Preoxygenation: Pre-oxygenation with 100% oxygen Placement Confirmation: positive ETCO2 Dental Injury: Teeth and Oropharynx as per pre-operative assessment

## 2017-09-04 NOTE — Anesthesia Preprocedure Evaluation (Signed)
Anesthesia Evaluation  Patient identified by MRN, date of birth, ID band Patient awake    Reviewed: Allergy & Precautions, NPO status , Patient's Chart, lab work & pertinent test results  History of Anesthesia Complications Negative for: history of anesthetic complications  Airway Mallampati: I  TM Distance: >3 FB Neck ROM: Full    Dental  (+) Edentulous Upper, Edentulous Lower   Pulmonary shortness of breath, former smoker,    breath sounds clear to auscultation       Cardiovascular hypertension, Pt. on medications + angina + CAD, + Past MI, + Cardiac Stents and + Peripheral Vascular Disease   Rhythm:Regular     Neuro/Psych PSYCHIATRIC DISORDERS Anxiety Depression    GI/Hepatic Neg liver ROS, PUD, GERD  ,  Endo/Other    Renal/GU CRFRenal disease     Musculoskeletal   Abdominal   Peds  Hematology  (+) anemia ,   Anesthesia Other Findings EF 45%, angina on admission, cardiac stent x2 in 02/2017 for MI, no chest pain today, Hgb acceptable   Reproductive/Obstetrics                             Anesthesia Physical Anesthesia Plan  ASA: IV  Anesthesia Plan: MAC   Post-op Pain Management:    Induction:   PONV Risk Score and Plan: 2  Airway Management Planned: Nasal Cannula  Additional Equipment:   Intra-op Plan:   Post-operative Plan:   Informed Consent: I have reviewed the patients History and Physical, chart, labs and discussed the procedure including the risks, benefits and alternatives for the proposed anesthesia with the patient or authorized representative who has indicated his/her understanding and acceptance.   Dental advisory given  Plan Discussed with: CRNA and Surgeon  Anesthesia Plan Comments:         Anesthesia Quick Evaluation

## 2017-09-05 DIAGNOSIS — I5021 Acute systolic (congestive) heart failure: Secondary | ICD-10-CM

## 2017-09-05 DIAGNOSIS — J810 Acute pulmonary edema: Secondary | ICD-10-CM

## 2017-09-05 LAB — RENAL FUNCTION PANEL
Albumin: 3.2 g/dL — ABNORMAL LOW (ref 3.5–5.0)
Anion gap: 9 (ref 5–15)
BUN: 15 mg/dL (ref 6–20)
CO2: 27 mmol/L (ref 22–32)
Calcium: 8.6 mg/dL — ABNORMAL LOW (ref 8.9–10.3)
Chloride: 103 mmol/L (ref 101–111)
Creatinine, Ser: 1.23 mg/dL — ABNORMAL HIGH (ref 0.44–1.00)
GFR calc Af Amer: 50 mL/min — ABNORMAL LOW (ref >60)
GFR calc non Af Amer: 43 mL/min — ABNORMAL LOW (ref >60)
Glucose, Bld: 90 mg/dL (ref 65–99)
Phosphorus: 4 mg/dL (ref 2.5–4.6)
Potassium: 3.2 mmol/L — ABNORMAL LOW (ref 3.5–5.1)
Sodium: 139 mmol/L (ref 135–145)

## 2017-09-05 LAB — CBC WITH DIFFERENTIAL/PLATELET
Basophils Absolute: 0 10*3/uL (ref 0.0–0.1)
Basophils Relative: 0 %
Eosinophils Absolute: 0.2 10*3/uL (ref 0.0–0.7)
Eosinophils Relative: 5 %
HCT: 33.4 % — ABNORMAL LOW (ref 36.0–46.0)
Hemoglobin: 11 g/dL — ABNORMAL LOW (ref 12.0–15.0)
Lymphocytes Relative: 27 %
Lymphs Abs: 1.4 10*3/uL (ref 0.7–4.0)
MCH: 29.5 pg (ref 26.0–34.0)
MCHC: 32.9 g/dL (ref 30.0–36.0)
MCV: 89.5 fL (ref 78.0–100.0)
Monocytes Absolute: 0.5 10*3/uL (ref 0.1–1.0)
Monocytes Relative: 11 %
Neutro Abs: 2.9 10*3/uL (ref 1.7–7.7)
Neutrophils Relative %: 57 %
Platelets: 225 10*3/uL (ref 150–400)
RBC: 3.73 MIL/uL — ABNORMAL LOW (ref 3.87–5.11)
RDW: 16.6 % — ABNORMAL HIGH (ref 11.5–15.5)
WBC: 5 10*3/uL (ref 4.0–10.5)

## 2017-09-05 MED ORDER — POTASSIUM CHLORIDE 20 MEQ PO PACK: 40.0000 meq | PACK | Freq: Once | ORAL | Status: DC

## 2017-09-05 MED ORDER — POTASSIUM CHLORIDE CRYS ER 20 MEQ PO TBCR
40.0000 meq | EXTENDED_RELEASE_TABLET | Freq: Once | ORAL | Status: DC
  Filled 2017-09-05: qty 2

## 2017-09-05 NOTE — Discharge Summary (Signed)
Physician Discharge Summary  Patient ID: Abigail [name] MRN: [id number] DOB/AGE: October 09, 1946 71 y.o.  Admit date: 09/03/2017 Discharge date: 09/05/2017  Admission Diagnoses:  Discharge Diagnoses:  Principal Problem:   GIB (gastrointestinal bleeding) Active Problems:   Symptomatic anemia   Acute systolic heart failure (HCC)   Chronic kidney disease (CKD), stage III (moderate) (HCC)   Coronary artery disease   Hyperlipidemia   Hypertension   Multiple myeloma in remission ([place])   Hematochezia   Acute blood loss anemia   Diverticulosis of colon with hemorrhage   Discharged Condition: Stable.  Hospital Course: Patient is a 71 year old African American Female with past medical history significant for chronic systolic heart failure, pulmonary hypertension, orthostatic hypotension, multiple myeloma in remission, stage III chronic kidney disease, known CAD with recent DES placed in March and May 2018, history of DVT with recent discontinuation of warfarin, hypertension, Tachy-brady syndrome and dyslipidemia.  The patient was just discharged on August 21, 2017 after an admission for HCAP and symptomatic anemia secondary to GI bleeding. Patient's coumadin was discontinued on last admission after confirming patient did not have DVT at the time. Patient was on aspirin and Brillanta prior to this admission, and the dose of Brillanta was decreased from 22m po bid to 6105mpo bid just prior to admission. Patient was admitted with another episode of GI Bleeding, largely, persistent hematochezia for several days. Patient's Hemoglobin had dropped from 8.9g/dl to 5.9g/dl within 2 weeks. Patient was admitted for further assessment and management. GI Team was consulted to direct care. Patient underwent EGD and Colonoscopy. EGD was non revealing, and the Schatzki ring was widely patent. Normal stomach and duodenum was reported. Colonoscopy revealed diverticulosis in the left and right colon, and small superficial  tear of posterior anal mucosa. Patient was transfused 3 units of PRBC during the hospital stay. Patient was also diuresed. With diuresis, patient's potassium dropped, but was replete during the hospital stay. There will be need to monitor renal function and electrolytes closely as well. Patient has been cleared for discharge. Brillanta will be restarted as per GI Team. Patient is to continue aspirin as well.  Consults: GI, Cardiology  Significant Diagnostic Studies: EGD and Colonoscopy  Treatments: Blood transfusion, and supportive care.  Discharge Exam: Blood pressure 123/76, pulse (!) 49, temperature 98.9 F (37.2 C), temperature source Oral, resp. rate 19, height '5\' 5"'  (1.651 m), weight 70.5 kg (155 lb 6.4 oz), SpO2 98 %.   Disposition: 06-Home-Health Care Svc  Discharge Instructions    Call MD for:   Complete by:  As directed    If bleeding roccurs   Diet - low sodium heart healthy   Complete by:  As directed    Discharge instructions   Complete by:  As directed    Avoid constipation. Call MD if bleeding reoccurs. Follow up with GI, Cardiology and PCP within 1 week   Increase activity slowly   Complete by:  As directed      Allergies as of 09/05/2017      Reactions   Sulfa Antibiotics Rash      Medication List    STOP taking these medications   acetaminophen 500 MG tablet Commonly known as:  TYLENOL   diclofenac sodium 1 % Gel Commonly known as:  VOLTAREN   traMADol 50 MG tablet Commonly known as:  ULTRAM     TAKE these medications   acyclovir 400 MG tablet Commonly known as:  ZOVIRAX TAKE 1 TABLET (400 MG TOTAL) BY MOUTH  2 (TWO) TIMES DAILY.   amiodarone 200 MG tablet Commonly known as:  PACERONE Take 0.5 tablets (100 mg total) by mouth daily.   aspirin EC 81 MG tablet Take 1 tablet (81 mg total) by mouth daily.   atorvastatin 80 MG tablet Commonly known as:  LIPITOR Take 1 tablet (80 mg total) by mouth daily at 6 PM.   BRILINTA 60 MG Tabs  tablet Generic drug:  ticagrelor Take 60 mg 2 (two) times daily by mouth. What changed:  Another medication with the same name was removed. Continue taking this medication, and follow the directions you see here.   calcium carbonate 1250 (500 Ca) MG tablet Commonly known as:  OS-CAL - dosed in mg of elemental calcium Take 1 tablet by mouth daily with breakfast.   ferrous sulfate 325 (65 FE) MG tablet Take 1 tablet (325 mg total) by mouth daily with breakfast.   gabapentin 100 MG capsule Commonly known as:  NEURONTIN Take 1 capsule (100 mg total) by mouth 3 (three) times daily.   nitroGLYCERIN 0.4 MG SL tablet Commonly known as:  NITROSTAT Place 0.4 mg under the tongue every 5 (five) minutes as needed for chest pain.   pantoprazole 40 MG tablet Commonly known as:  PROTONIX Take 1 tablet (40 mg total) by mouth daily.   pyridostigmine 60 MG tablet Commonly known as:  MESTINON Take 0.5 tablets (30 mg total) by mouth 2 (two) times daily.   vitamin C 1000 MG tablet Take 1,000 mg by mouth daily.   VITAMIN D PO Take 1 capsule by mouth daily.        SignedBonnell Public 09/05/2017, 12:53 PM

## 2017-09-05 NOTE — Progress Notes (Signed)
Daily Rounding Note  09/05/2017, 8:45 AM  LOS: 2 days   SUBJECTIVE:   One dark stool last evening.  No GI distress.  Weakness improved but persists, dyspnea resolved. No dizziness.    Asking about when/if Brilinta will restart.     OBJECTIVE:         Vital signs in last 24 hours:    Temp:  [98.4 F (36.9 C)-99.5 F (37.5 C)] 99.1 F (37.3 C) (11/17 0316) Pulse Rate:  [45-97] 46 (11/17 0316) Resp:  [1-22] 1 (11/17 0316) BP: (111-165)/(57-97) 145/61 (11/17 0316) SpO2:  [92 %-100 %] 98 % (11/17 0316) Weight:  [70.5 kg (155 lb 6.4 oz)] 70.5 kg (155 lb 6.4 oz) (11/17 0316) Last BM Date: 09/04/17 Filed Weights   09/03/17 1645 09/04/17 0733 09/05/17 0316  Weight: 72 kg (158 lb 11.7 oz) 71.7 kg (158 lb) 70.5 kg (155 lb 6.4 oz)   General: pleasant, chronically ill looking   Heart: RRR.  Sinus brady in high 40s on tele Chest: clear bil.  No dyspnea or cough Abdomen: soft, NT, ND.  Active BS  Extremities: no CCE Neuro/Psych:  Oriented x 3.  No deficits.  Fully alert.    Intake/Output from previous day: 11/16 0701 - 11/17 0700 In: 820 [P.O.:820] Out: 1100 [Urine:1100]  Intake/Output this shift: No intake/output data recorded.  Lab Results: Recent Labs    09/03/17 0449 09/03/17 1503 09/04/17 1100  WBC 8.1  --  DUPLICATE REFER TO [id number]  6.3  HGB 5.9* 9.9* DUPLICATE REFER TO [id number]  10.2*  HCT 61.6* 07.3* DUPLICATE REFER TO [id number]  31.1*  PLT 948  --  DUPLICATE REFER TO [id number]  233   BMET Recent Labs    09/03/17 0449 09/03/17 1503 09/04/17 1100  NA 141 144 142  K 3.8 4.0 3.2*  CL 114*  --  108  CO2 20*  --  25  GLUCOSE 99 91 90  BUN 10  --  12  CREATININE 1.01*  --  1.09*  CALCIUM 8.3*  --  8.7*   LFT Recent Labs    09/04/17 1100  PROT 5.6*  ALBUMIN 3.1*  AST 15  ALT 9*  ALKPHOS 71  BILITOT 1.7*   PT/INR Recent Labs    09/03/17 0449  LABPROT 13.5  INR 1.04   Hepatitis Panel No  results for input(s): HEPBSAG, HCVAB, HEPAIGM, HEPBIGM in the last 72 hours.  Studies/Results: Dg Chest Port 1 View  Result Date: 09/03/2017 CLINICAL DATA:  Coronary artery disease. EXAM: PORTABLE CHEST 1 VIEW COMPARISON:  CT 09/03/2017 . FINDINGS: Cardiomegaly with pulmonary vascular prominence and bilateral interstitial prominence. No pleural effusion or pneumothorax. No acute bony abnormality. IMPRESSION: Congestive heart failure with bilateral pulmonary interstitial edema. Electronically Signed   By: Marcello Moores  Register   On: 09/03/2017 09:51   Scheduled Meds: . amiodarone  100 mg Oral Daily  . aspirin EC  81 mg Oral Daily  . furosemide  40 mg Intravenous Q12H  . gabapentin  100 mg Oral TID  . pantoprazole  40 mg Oral Q0600  . pyridostigmine  30 mg Oral BID  . sodium chloride flush  10-40 mL Intracatheter Q12H   Continuous Infusions: PRN Meds:.acetaminophen **OR** acetaminophen, hydrALAZINE, morphine injection, nitroGLYCERIN, sodium chloride flush   ASSESMENT:   *  Hematochezia/dark bloody school.  BUN normal.  Persistent bleeding since starting ~ 10/28  11/15 EGD.  Small HH.  Patent Schatzki ring.  Otherwise normal  exam.   11/16 colonoscopy.  No blood present.  Pandiverticulosis.  Superficial tear in posterior anal mucosa, doubtful this is source of bleeding but if active bleeding, tear needs close scrutiny.    *  Blood loss anemia.  PRBC x 2 during 10/28 admission, x 3 this admission with Hgb 5.9 >> 9.9 >> 10.2.    *  CAD.  Stenting in 1999 and march and May 2018.  Brilinta on hold, low dose ASA continues.    *  Hx DVT, atrial thrombus.  Warfarin d/c'd during admission 2 weeks ago due to GI bleeding.    *  MM.  Followed by Dr Ammie Dalton.  Felt to be in remission.  Previous Rx with Zometa.     PLAN   *  Observe with periodic CBC.  With the dark stool last PM,  Wonder if next best step would be capsule endoscopy with pt back on Brilinta, which can be arranged as outpt?  If  bleeding were from the anal tear, would expect red blood, not dark/burgundy stool blood.  *  ? Restart Brilinta, timing?     Abigail [name]  09/05/2017, 8:45 AM Pager: [phone]  GI ATTENDING  Interval history data reviewed. Patient personally seen and examined. Case discussed with Dr. Loletha Carrow. Agree with interval progress note. Acute GI bleeding presumably due to diverticulosis has resolved. Endoscopy and colonoscopy findings as outlined their reports. Hemoglobin stable. Okay to resume Brilinta and aspirin from GI standpoint. Okay for patient to be discharged home. If she were to have recurrent acute bleeding consider tagged red blood cell scan. For subacute or occult bleeding, consider small bowel source. Consider capsule endoscopy. Resume care with primary care physician and other specialists. No particular GI follow-up required at this time. Certainly available if needed for recurrent problems as discussed.  Docia Chuck. Geri Seminole., M.D. [facility] Division of Gastroenterology

## 2017-09-06 ENCOUNTER — Encounter (HOSPITAL_COMMUNITY): Payer: Self-pay | Admitting: Gastroenterology

## 2017-09-17 ENCOUNTER — Other Ambulatory Visit (HOSPITAL_BASED_OUTPATIENT_CLINIC_OR_DEPARTMENT_OTHER): Payer: Medicare HMO

## 2017-09-17 ENCOUNTER — Other Ambulatory Visit: Payer: Self-pay | Admitting: Emergency Medicine

## 2017-09-17 ENCOUNTER — Ambulatory Visit (HOSPITAL_BASED_OUTPATIENT_CLINIC_OR_DEPARTMENT_OTHER): Payer: Medicare HMO

## 2017-09-17 ENCOUNTER — Ambulatory Visit (HOSPITAL_BASED_OUTPATIENT_CLINIC_OR_DEPARTMENT_OTHER): Payer: Medicare HMO | Admitting: Oncology

## 2017-09-17 VITALS — BP 157/73 | HR 71 | Temp 99.3°F | Resp 20 | Ht 65.0 in | Wt 163.0 lb

## 2017-09-17 DIAGNOSIS — Z86718 Personal history of other venous thrombosis and embolism: Secondary | ICD-10-CM | POA: Diagnosis not present

## 2017-09-17 DIAGNOSIS — D630 Anemia in neoplastic disease: Secondary | ICD-10-CM | POA: Diagnosis not present

## 2017-09-17 DIAGNOSIS — C9001 Multiple myeloma in remission: Secondary | ICD-10-CM

## 2017-09-17 DIAGNOSIS — Z23 Encounter for immunization: Secondary | ICD-10-CM

## 2017-09-17 DIAGNOSIS — C9000 Multiple myeloma not having achieved remission: Secondary | ICD-10-CM | POA: Diagnosis not present

## 2017-09-17 LAB — COMPREHENSIVE METABOLIC PANEL
ALBUMIN: 3.6 g/dL (ref 3.5–5.0)
ALK PHOS: 93 U/L (ref 40–150)
ALT: 11 U/L (ref 0–55)
AST: 14 U/L (ref 5–34)
Anion Gap: 12 mEq/L — ABNORMAL HIGH (ref 3–11)
BUN: 15.6 mg/dL (ref 7.0–26.0)
CO2: 23 meq/L (ref 22–29)
Calcium: 8.6 mg/dL (ref 8.4–10.4)
Chloride: 109 mEq/L (ref 98–109)
Creatinine: 1 mg/dL (ref 0.6–1.1)
GLUCOSE: 96 mg/dL (ref 70–140)
POTASSIUM: 3.1 meq/L — AB (ref 3.5–5.1)
SODIUM: 144 meq/L (ref 136–145)
Total Bilirubin: 1.22 mg/dL — ABNORMAL HIGH (ref 0.20–1.20)
Total Protein: 6.6 g/dL (ref 6.4–8.3)

## 2017-09-17 LAB — CBC WITH DIFFERENTIAL/PLATELET
BASO%: 0.2 % (ref 0.0–2.0)
BASOS ABS: 0 10*3/uL (ref 0.0–0.1)
EOS ABS: 0.1 10*3/uL (ref 0.0–0.5)
EOS%: 1.4 % (ref 0.0–7.0)
HEMATOCRIT: 35.8 % (ref 34.8–46.6)
HGB: 11.3 g/dL — ABNORMAL LOW (ref 11.6–15.9)
LYMPH#: 0.7 10*3/uL — AB (ref 0.9–3.3)
LYMPH%: 11.5 % — AB (ref 14.0–49.7)
MCH: 29.4 pg (ref 25.1–34.0)
MCHC: 31.6 g/dL (ref 31.5–36.0)
MCV: 93.2 fL (ref 79.5–101.0)
MONO#: 0.5 10*3/uL (ref 0.1–0.9)
MONO%: 9.2 % (ref 0.0–14.0)
NEUT#: 4.4 10*3/uL (ref 1.5–6.5)
NEUT%: 77.7 % — AB (ref 38.4–76.8)
PLATELETS: 231 10*3/uL (ref 145–400)
RBC: 3.84 10*6/uL (ref 3.70–5.45)
RDW: 16.5 % — ABNORMAL HIGH (ref 11.2–14.5)
WBC: 5.6 10*3/uL (ref 3.9–10.3)

## 2017-09-17 MED ORDER — PNEUMOCOCCAL 13-VAL CONJ VACC IM SUSP
0.5000 mL | Freq: Once | INTRAMUSCULAR | Status: AC
  Administered 2017-09-17: 0.5 mL via INTRAMUSCULAR
  Filled 2017-09-17: qty 0.5

## 2017-09-17 MED ORDER — POTASSIUM CHLORIDE CRYS ER 20 MEQ PO TBCR: 20.0000 meq | EXTENDED_RELEASE_TABLET | Freq: Every day | ORAL | 0 refills | Status: DC

## 2017-09-17 NOTE — Progress Notes (Signed)
Attempted IV access x 2 without success. Patient states she does not want to have additional attempts made. Requested this appt to be rescheduled. Rosalio Macadamia, RN aware and will reschedule.

## 2017-09-17 NOTE — Progress Notes (Signed)
[place] OFFICE PROGRESS NOTE   Diagnosis: Multiple myeloma  INTERVAL HISTORY:   She was admitted with acute GI bleeding and a supratherapeutic PT/INR on 08/16/2017.  She received red cell transfusions and was discharged on 08/21/2017.  Coumadin was discontinued.  The Brilinta dose was decreased.  She was readmitted 09/03/2017 with recurrent bleeding.  An upper endoscopy was unremarkable.  A colonoscopy revealed diverticulosis.  She was transfused with packed red blood cells and was discharged home 09/05/2017.  She is taking aspirin and Brilinta.  No further bleeding.  She feels faint at times when she stands up.  She has orthopnea.  She relates this to intermittent volume overload.  She takes a diuretic as needed.    Objective:  Vital signs in last 24 hours:  Blood pressure (!) 157/73, pulse 71, temperature 99.3 F (37.4 C), temperature source Oral, resp. rate 20, height '5\' 5"'  (1.651 m), weight 163 lb (73.9 kg), SpO2 100 %.    HEENT: Mild white coat over the tongue, no buccal thrush Resp: Lungs clear bilaterally Cardio: Regular rate and rhythm GI: No hepatomegaly, no mass, nontender Vascular: No leg edema      Lab Results:  Lab Results  Component Value Date   WBC 5.6 09/17/2017   HGB 11.3 (L) 09/17/2017   HCT 35.8 09/17/2017   MCV 93.2 09/17/2017   PLT 231 09/17/2017   NEUTROABS 4.4 09/17/2017    CMP     Component Value Date/Time   NA 144 09/17/2017 1429   K 3.1 (L) 09/17/2017 1429   CL 103 09/05/2017 0900   CO2 23 09/17/2017 1429   GLUCOSE 96 09/17/2017 1429   BUN 15.6 09/17/2017 1429   CREATININE 1.0 09/17/2017 1429   CALCIUM 8.6 09/17/2017 1429   PROT 6.6 09/17/2017 1429   ALBUMIN 3.6 09/17/2017 1429   AST 14 09/17/2017 1429   ALT 11 09/17/2017 1429   ALKPHOS 93 09/17/2017 1429   BILITOT 1.22 (H) 09/17/2017 1429   GFRNONAA 43 (L) 09/05/2017 0900   GFRAA 50 (L) 09/05/2017 0900   Serum M spike on 07/30/2017-0 0.2, serum free lambda  light chains 10.1  Medications: I have reviewed the patient's current medications.  Assessment/Plan: 1. Multiple myeloma, IgG lambda, bone marrow biopsy 06/15/2016 confirmed multiple myeloma; cytogenetics by FISHshow +11, +12, 13q-  Elevated serum free lambda light chains  Lambda light chain proteinuria  Lytic bone lesions on a bone survey 06/13/2016  Initiation of weekly Velcade/Decadron 06/19/2016  Serum light chains improved 07/31/2016  Initiation of Revlimid 11/03/20172 weeks on/2 weeks off  Serum M spike and IgG significantly improved 09/25/2016  Velcade held on 10/30/2016 due to urinary retention  Treatment resumed with Revlimid (2 weeks on/2 weeks off) and weekly Decadron on 02/12/2017, treatment placed on hold May 2018 secondary to admissions with cardiac disease   2. Severe anemia secondary to #1-markedly improved  3. Diffuse lytic bone lesions secondary to multiple myeloma, status post Zometa 09/18/2016(plan to continue every 3 months)  4. History of coronary artery disease/myocardial infarction  5. History of colon polyps  6. Bilateral leg edema 09/04/2016. Negative bilateral venous Doppler 09/05/2016.  7. Mild periorbital edema 09/18/2016, question related to early stye formation, question Velcade related chalazia.Improved.  8. CT scan 10/27/2016 with a new right kidney massevaluated by urology; MRI abdomen 11/18/2016 multiple bilateral renal cysts. Most of these are simple cysts. The 2 lesions in question on the CT scan are both hemorrhagic or proteinaceous cysts. No worrisome contrast enhancement.  9. Urinary retention 10/28/2016 status post evaluation in the emergency department. Urinalysis negative for signs of infection 10/30/2016. Velcade held. Resolved.  10. Abdominal pain, nausea, vomiting.Etiology unclear. Upper endoscopy 11/18/2016 with findings of reflux esophagitis, benign appearing esophageal stenosis, small hiatal hernia and  erythematous duodenopathy. CT abdomen/pelvis 11/19/2016 with no acute abnormality.  11. 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 Left lower extremity DVT May 2018.  Maintained on Coumadin.  Coumadin discontinued October 2018 secondary to a supratherapeutic PT/INR and GI bleeding  13.  Admission with GI bleeding October and November 2018-felt secondary to diverticular bleeding, initially while on supratherapeutic Coumadin, colonoscopy 09/04/2017-no source of blood loss identified, superficial tear at the posterior anal mucosa without stigmata of bleeding   Disposition:  Abigail [name] remains in clinical remission from multiple myeloma.  She will remain off of specific therapy for myeloma.  She will resume Zometa therapy today.  [name] will receive a 13 valent pneumococcal vaccine today.  She will return for an office and lab visit in 6 weeks.  She will begin a potassium supplement.  She takes diuretic as needed.  [name], MD  09/17/2017  3:38 PM

## 2017-09-18 ENCOUNTER — Telehealth: Payer: Self-pay | Admitting: Oncology

## 2017-09-18 ENCOUNTER — Other Ambulatory Visit: Payer: Self-pay | Admitting: Cardiovascular Disease

## 2017-09-18 DIAGNOSIS — Z1231 Encounter for screening mammogram for malignant neoplasm of breast: Secondary | ICD-10-CM

## 2017-09-18 LAB — KAPPA/LAMBDA LIGHT CHAINS
IG KAPPA FREE LIGHT CHAIN: 7.4 mg/L (ref 3.3–19.4)
Ig Lambda Free Light Chain: 11 mg/L (ref 5.7–26.3)
Kappa/Lambda FluidC Ratio: 0.67 (ref 0.26–1.65)

## 2017-09-18 LAB — IGG: IGG (IMMUNOGLOBIN G), SERUM: 579 mg/dL — AB (ref 700–1600)

## 2017-09-18 NOTE — Telephone Encounter (Signed)
Scheduled appt per 11/29 los - lab and f/u 10/29/2017  - sent reminder letter in the mail with appt date and time.

## 2017-09-21 ENCOUNTER — Telehealth: Payer: Self-pay | Admitting: Oncology

## 2017-09-21 LAB — PROTEIN ELECTROPHORESIS, SERUM
A/G Ratio: 1.3 (ref 0.7–1.7)
ALPHA 1: 0.3 g/dL (ref 0.0–0.4)
ALPHA 2: 0.8 g/dL (ref 0.4–1.0)
Albumin: 3.5 g/dL (ref 2.9–4.4)
BETA: 0.9 g/dL (ref 0.7–1.3)
GAMMA GLOBULIN: 0.5 g/dL (ref 0.4–1.8)
Globulin, Total: 2.6 g/dL (ref 2.2–3.9)
M-SPIKE, %: 0.2 g/dL — AB
Total Protein: 6.1 g/dL (ref 6.0–8.5)

## 2017-09-21 NOTE — Telephone Encounter (Signed)
Scheduled appt per 11/29 sch message - patient is aware of appt date and time on 12/20.

## 2017-10-08 ENCOUNTER — Ambulatory Visit (HOSPITAL_BASED_OUTPATIENT_CLINIC_OR_DEPARTMENT_OTHER): Payer: Medicare HMO

## 2017-10-08 VITALS — BP 136/67 | HR 60 | Temp 98.9°F | Resp 18

## 2017-10-08 DIAGNOSIS — C9000 Multiple myeloma not having achieved remission: Secondary | ICD-10-CM | POA: Diagnosis not present

## 2017-10-08 MED ORDER — SODIUM CHLORIDE 0.9 % IV SOLN
Freq: Once | INTRAVENOUS | Status: AC
  Administered 2017-10-08: 10:00:00 via INTRAVENOUS

## 2017-10-08 MED ORDER — ZOLEDRONIC ACID 4 MG/100ML IV SOLN
4.0000 mg | Freq: Once | INTRAVENOUS | Status: AC
  Administered 2017-10-08: 4 mg via INTRAVENOUS
  Filled 2017-10-08: qty 100

## 2017-10-08 NOTE — Patient Instructions (Signed)

## 2017-10-21 ENCOUNTER — Ambulatory Visit
Admission: RE | Admit: 2017-10-21 | Discharge: 2017-10-21 | Disposition: A | Discharge status: Home / Self Care | Payer: Medicare HMO | Source: Ambulatory Visit | Attending: Cardiovascular Disease | Admitting: Cardiovascular Disease

## 2017-10-21 DIAGNOSIS — Z1231 Encounter for screening mammogram for malignant neoplasm of breast: Secondary | ICD-10-CM

## 2017-10-23 ENCOUNTER — Other Ambulatory Visit: Payer: Self-pay | Admitting: Emergency Medicine

## 2017-10-23 MED ORDER — POTASSIUM CHLORIDE CRYS ER 20 MEQ PO TBCR: 20.0000 meq | EXTENDED_RELEASE_TABLET | Freq: Every day | ORAL | 0 refills | Status: DC

## 2017-10-29 ENCOUNTER — Encounter: Payer: Self-pay | Admitting: Nurse Practitioner

## 2017-10-29 ENCOUNTER — Inpatient Hospital Stay: Payer: Medicare HMO | Attending: Nurse Practitioner | Admitting: Nurse Practitioner

## 2017-10-29 ENCOUNTER — Inpatient Hospital Stay: Payer: Medicare HMO

## 2017-10-29 VITALS — BP 159/86 | HR 52 | Temp 98.5°F | Resp 18 | Wt 167.4 lb

## 2017-10-29 DIAGNOSIS — Z7901 Long term (current) use of anticoagulants: Secondary | ICD-10-CM | POA: Diagnosis not present

## 2017-10-29 DIAGNOSIS — R112 Nausea with vomiting, unspecified: Secondary | ICD-10-CM | POA: Insufficient documentation

## 2017-10-29 DIAGNOSIS — C9001 Multiple myeloma in remission: Secondary | ICD-10-CM

## 2017-10-29 DIAGNOSIS — C9000 Multiple myeloma not having achieved remission: Secondary | ICD-10-CM | POA: Diagnosis not present

## 2017-10-29 DIAGNOSIS — D630 Anemia in neoplastic disease: Secondary | ICD-10-CM | POA: Diagnosis not present

## 2017-10-29 DIAGNOSIS — Z86718 Personal history of other venous thrombosis and embolism: Secondary | ICD-10-CM | POA: Insufficient documentation

## 2017-10-29 DIAGNOSIS — R339 Retention of urine, unspecified: Secondary | ICD-10-CM

## 2017-10-29 LAB — CBC WITH DIFFERENTIAL/PLATELET
BASOS ABS: 0 10*3/uL (ref 0.0–0.1)
Basophils Relative: 1 %
EOS ABS: 0.1 10*3/uL (ref 0.0–0.5)
Eosinophils Relative: 2 %
HCT: 37.3 % (ref 34.8–46.6)
HEMOGLOBIN: 11.7 g/dL (ref 11.6–15.9)
LYMPHS ABS: 1 10*3/uL (ref 0.9–3.3)
LYMPHS PCT: 21 %
MCH: 29 pg (ref 25.1–34.0)
MCHC: 31.5 g/dL (ref 31.5–36.0)
MCV: 92.1 fL (ref 79.5–101.0)
Monocytes Absolute: 0.4 10*3/uL (ref 0.1–0.9)
Monocytes Relative: 8 %
NEUTROS PCT: 68 %
Neutro Abs: 3.1 10*3/uL (ref 1.5–6.5)
PLATELETS: 202 10*3/uL (ref 145–400)
RBC: 4.04 MIL/uL (ref 3.70–5.45)
RDW: 17.6 % — ABNORMAL HIGH (ref 11.2–16.1)
WBC: 4.6 10*3/uL (ref 3.9–10.3)

## 2017-10-29 LAB — COMPREHENSIVE METABOLIC PANEL
ALT: 14 U/L (ref 0–55)
AST: 14 U/L (ref 5–34)
Albumin: 4.2 g/dL (ref 3.5–5.0)
Alkaline Phosphatase: 133 U/L (ref 40–150)
Anion gap: 8 (ref 3–11)
BILIRUBIN TOTAL: 0.5 mg/dL (ref 0.2–1.2)
BUN: 20 mg/dL (ref 7–26)
CHLORIDE: 116 mmol/L — AB (ref 98–109)
CO2: 20 mmol/L — ABNORMAL LOW (ref 22–29)
Calcium: 9.8 mg/dL (ref 8.4–10.4)
Creatinine, Ser: 1.53 mg/dL — ABNORMAL HIGH (ref 0.60–1.10)
GFR calc non Af Amer: 33 mL/min — ABNORMAL LOW (ref >60)
GFR, EST AFRICAN AMERICAN: 39 mL/min — AB (ref >60)
Glucose, Bld: 76 mg/dL (ref 70–140)
POTASSIUM: 4.2 mmol/L (ref 3.3–4.7)
Sodium: 144 mmol/L (ref 136–145)
TOTAL PROTEIN: 7.3 g/dL (ref 6.4–8.3)

## 2017-10-29 NOTE — Progress Notes (Signed)
[place] OFFICE PROGRESS NOTE   Diagnosis: Multiple myeloma  INTERVAL HISTORY:   Ms. Mcdonald returns as scheduled.  She overall feels well.  No shortness of breath.  No bleeding.  No dental issues/jaw pain.  She reports a good appetite.  No constipation or diarrhea.  No nausea.  Objective:  Vital signs in last 24 hours:  Blood pressure (!) 159/86, pulse (!) 52, temperature 98.5 F (36.9 C), temperature source Oral, resp. rate 18, weight 167 lb 6 oz (75.9 kg), SpO2 100 %.    HEENT: White coating over tongue.  No buccal thrush. Resp: Lungs clear bilaterally. Cardio: Regular rate and rhythm. GI: Abdomen soft and nontender.  No hepatosplenomegaly. Vascular: No leg edema.  Calves soft and nontender.    Lab Results:  Lab Results  Component Value Date   WBC 4.6 10/29/2017   HGB 11.7 10/29/2017   HCT 37.3 10/29/2017   MCV 92.1 10/29/2017   PLT 202 10/29/2017   NEUTROABS 3.1 10/29/2017    Imaging:  No results found.  Medications: I have reviewed the patient's current medications.  Assessment/Plan: 1. Multiple myeloma, IgG lambda, bone marrow biopsy 06/15/2016 confirmed multiple myeloma; cytogenetics by FISHshow +11, +12, 13q-  Elevated serum free lambda light chains  Lambda light chain proteinuria  Lytic bone lesions on a bone survey 06/13/2016  Initiation of weekly Velcade/Decadron 06/19/2016  Serum light chains improved 07/31/2016  Initiation of Revlimid 11/03/20172 weeks on/2 weeks off  Serum M spike and IgG significantly improved 09/25/2016  Velcade held on 10/30/2016 due to urinary retention  Treatment resumed with Revlimid (2 weeks on/2 weeks off) and weekly Decadron on 02/12/2017, treatment placed on hold May 2018 secondary to admissions with cardiac disease  2. Severe anemia secondary to #1-markedly improved  3. Diffuse lytic bone lesions secondary to multiple myeloma, status post Zometa 09/18/2016(plan to continue every 3 months);  most recent Zometa infusion 10/08/2017  4. History of coronary artery disease/myocardial infarction  5. History of colon polyps  6. Bilateral leg edema 09/04/2016. Negative bilateral venous Doppler 09/05/2016.  7. Mild periorbital edema 09/18/2016, question related to early stye formation, question Velcade related chalazia.Improved.  8. CT scan 10/27/2016 with a new right kidney massevaluated by urology; MRI abdomen 11/18/2016 multiple bilateral renal cysts. Most of these are simple cysts. The 2 lesions in question on the CT scan are both hemorrhagic or proteinaceous cysts. No worrisome contrast enhancement.  9. Urinary retention 10/28/2016 status post evaluation in the emergency department. Urinalysis negative for signs of infection 10/30/2016. Velcade held. Resolved.  10. Abdominal pain, nausea, vomiting.Etiology unclear. Upper endoscopy 11/18/2016 with findings of reflux esophagitis, benign appearing esophageal stenosis, small hiatal hernia and erythematous duodenopathy. CT abdomen/pelvis 11/19/2016 with no acute abnormality.  11. 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 Left lower extremity DVT May 2018.  Maintained on Coumadin.  Coumadin discontinued October 2018 secondary to a supratherapeutic PT/INR and GI bleeding  13.  Admission with GI bleeding October and November 2018-felt secondary to diverticular bleeding, initially while on supratherapeutic Coumadin, colonoscopy 09/04/2017-no source of blood loss identified, superficial tear at the posterior anal mucosa without stigmata of bleeding     Disposition: Ms. Gita Kudo appears stable.  She remains in clinical remission from multiple myeloma.  We will follow-up on the outstanding myeloma labs from today.  Plan to continue Zometa every 3 months.  She will return for a follow-up visit and labs in 6 weeks.  She will contact the office  in the interim with any problems.    [name] ANP/GNP-BC     10/29/2017  2:40 PM

## 2017-10-30 ENCOUNTER — Telehealth: Payer: Self-pay

## 2017-10-30 ENCOUNTER — Telehealth: Payer: Self-pay | Admitting: Nurse Practitioner

## 2017-10-30 ENCOUNTER — Telehealth: Payer: Self-pay | Admitting: Oncology

## 2017-10-30 ENCOUNTER — Other Ambulatory Visit: Payer: Self-pay | Admitting: Nurse Practitioner

## 2017-10-30 DIAGNOSIS — C9001 Multiple myeloma in remission: Secondary | ICD-10-CM

## 2017-10-30 LAB — KAPPA/LAMBDA LIGHT CHAINS
Kappa free light chain: 8.6 mg/L (ref 3.3–19.4)
Kappa, lambda light chain ratio: 0.67 (ref 0.26–1.65)
Lambda free light chains: 12.8 mg/L (ref 5.7–26.3)

## 2017-10-30 NOTE — Telephone Encounter (Signed)
Attempted to call home and cell. Left VM for patient to return call to discuss lab results. Will try again.  Cyndia Bent RN

## 2017-10-30 NOTE — Telephone Encounter (Signed)
Scheduled appt per 1/11 sch msg - left voicemail for patient regarding appts. Sending confirmation letter in the mail as well.

## 2017-10-30 NOTE — Telephone Encounter (Signed)
Scheduled appt per 1/10 los - Sent reminder letter in the mail -

## 2017-10-30 NOTE — Telephone Encounter (Signed)
-----   Message from Owens Shark, NP sent at 10/30/2017  9:23 AM EST ----- Please let her know kidney function was a little off yesterday.  We would like her to return for repeat labs on 11/12/2017.

## 2017-11-02 LAB — PROTEIN ELECTROPHORESIS, SERUM
A/G RATIO SPE: 1.4 (ref 0.7–1.7)
ALPHA-2-GLOBULIN: 0.9 g/dL (ref 0.4–1.0)
Albumin ELP: 4 g/dL (ref 2.9–4.4)
Alpha-1-Globulin: 0.3 g/dL (ref 0.0–0.4)
BETA GLOBULIN: 1 g/dL (ref 0.7–1.3)
GAMMA GLOBULIN: 0.7 g/dL (ref 0.4–1.8)
Globulin, Total: 2.8 g/dL (ref 2.2–3.9)
M-Spike, %: 0.2 g/dL — ABNORMAL HIGH
Total Protein ELP: 6.8 g/dL (ref 6.0–8.5)

## 2017-11-12 ENCOUNTER — Inpatient Hospital Stay: Payer: Medicare HMO

## 2017-11-12 ENCOUNTER — Encounter: Payer: Self-pay | Admitting: Nurse Practitioner

## 2017-11-12 ENCOUNTER — Inpatient Hospital Stay (HOSPITAL_BASED_OUTPATIENT_CLINIC_OR_DEPARTMENT_OTHER): Payer: Medicare HMO | Admitting: Nurse Practitioner

## 2017-11-12 VITALS — BP 164/90 | HR 51 | Temp 99.4°F | Resp 16 | Ht 65.0 in | Wt 174.7 lb

## 2017-11-12 DIAGNOSIS — D630 Anemia in neoplastic disease: Secondary | ICD-10-CM

## 2017-11-12 DIAGNOSIS — C9000 Multiple myeloma not having achieved remission: Secondary | ICD-10-CM

## 2017-11-12 DIAGNOSIS — R339 Retention of urine, unspecified: Secondary | ICD-10-CM | POA: Diagnosis not present

## 2017-11-12 DIAGNOSIS — C9001 Multiple myeloma in remission: Secondary | ICD-10-CM

## 2017-11-12 LAB — BASIC METABOLIC PANEL - CANCER CENTER ONLY
Anion gap: 9 (ref 3–11)
BUN: 19 mg/dL (ref 7–26)
CALCIUM: 8.8 mg/dL (ref 8.4–10.4)
CO2: 19 mmol/L — AB (ref 22–29)
CREATININE: 1.14 mg/dL — AB (ref 0.60–1.10)
Chloride: 117 mmol/L — ABNORMAL HIGH (ref 98–109)
GFR, EST AFRICAN AMERICAN: 55 mL/min — AB (ref >60)
GFR, Estimated: 48 mL/min — ABNORMAL LOW (ref >60)
Glucose, Bld: 100 mg/dL (ref 70–140)
Potassium: 3.7 mmol/L (ref 3.3–4.7)
SODIUM: 145 mmol/L (ref 136–145)

## 2017-11-12 NOTE — Progress Notes (Signed)
[place] OFFICE PROGRESS NOTE   Diagnosis: Multiple myeloma  INTERVAL HISTORY:   Abigail [name] returns prior to scheduled follow-up.  She has a scar at the lower abdomen that periodically "opens".  This has happened multiple times over the years.  There is no associated discomfort.  She also reports having a "cold".  She has nasal congestion.  She has a productive cough.  No fever or shaking chills.  Her chest is sore from coughing.  She reports similar symptoms over the past few months that "come and go".  Objective:  Vital signs in last 24 hours:  Blood pressure (!) 164/90, pulse (!) 51, temperature 99.4 F (37.4 C), resp. rate 16, height '5\' 5"'  (1.651 m), weight 174 lb 11.2 oz (79.2 kg), SpO2 98 %.    HEENT: Posterior pharynx is without erythema or exudate. Resp: Lungs are clear bilaterally.  No respiratory distress. Cardio: Regular rate and rhythm. GI: Abdomen soft and nontender.  No hepatosplenomegaly. Vascular: Trace edema at the lower legs bilaterally. Skin: Scar at the right lower abdomen located in a skin fold with mild superficial abrasion.  No surrounding erythema.  Nontender.   Lab Results:  Lab Results  Component Value Date   WBC 4.6 10/29/2017   HGB 11.7 10/29/2017   HCT 37.3 10/29/2017   MCV 92.1 10/29/2017   PLT 202 10/29/2017   NEUTROABS 3.1 10/29/2017    Imaging:  No results found.  Medications: I have reviewed the patient's current medications.  Assessment/Plan: 1. Multiple myeloma, IgG lambda, bone marrow biopsy 06/15/2016 confirmed multiple myeloma; cytogenetics by FISHshow +11, +12, 13q-  Elevated serum free lambda light chains  Lambda light chain proteinuria  Lytic bone lesions on a bone survey 06/13/2016  Initiation of weekly Velcade/Decadron 06/19/2016  Serum light chains improved 07/31/2016  Initiation of Revlimid 11/03/20172 weeks on/2 weeks off  Serum M spike and IgG significantly improved 09/25/2016  Velcade held  on 10/30/2016 due to urinary retention  Treatment resumed with Revlimid (2 weeks on/2 weeks off) and weekly Decadron on 02/12/2017, treatment placed on hold May 2018 secondary to admissions with cardiac disease  2. Severe anemia secondary to #1-markedly improved  3. Diffuse lytic bone lesions secondary to multiple myeloma, status post Zometa 09/18/2016(plan to continue every 3 months); most recent Zometa infusion 10/08/2017  4. History of coronary artery disease/myocardial infarction  5. History of colon polyps  6. Bilateral leg edema 09/04/2016. Negative bilateral venous Doppler 09/05/2016.  7. Mild periorbital edema 09/18/2016, question related to early stye formation, question Velcade related chalazia.Improved.  8. CT scan 10/27/2016 with a new right kidney massevaluated by urology; MRI abdomen 11/18/2016 multiple bilateral renal cysts. Most of these are simple cysts. The 2 lesions in question on the CT scan are both hemorrhagic or proteinaceous cysts. No worrisome contrast enhancement.  9. Urinary retention 10/28/2016 status post evaluation in the emergency department. Urinalysis negative for signs of infection 10/30/2016. Velcade held. Resolved.  10. Abdominal pain, nausea, vomiting.Etiology unclear. Upper endoscopy 11/18/2016 with findings of reflux esophagitis, benign appearing esophageal stenosis, small hiatal hernia and erythematous duodenopathy. CT abdomen/pelvis 11/19/2016 with no acute abnormality.  11. 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 Left lower extremity DVT May 2018.Maintainedon Coumadin.Coumadin discontinued October 2018 secondary to a supratherapeutic PT/INR and GI bleeding  13.Admission with GI bleeding October and November 2018-felt secondary to diverticular bleeding, initially while on supratherapeutic Coumadin, colonoscopy 09/04/2017-no source of blood loss identified, superficial tear at the posterior anal  mucosa without stigmata of bleeding     Disposition: Abigail [name] appears stable.  She most likely has a viral URI.  She will treat symptomatically.  She understands to contact the office with fever, chills, dyspnea.  She will follow-up as scheduled.  Plan reviewed with Dr. Benay Spice.    [name] ANP/GNP-BC   11/12/2017  4:55 PM

## 2017-12-10 ENCOUNTER — Inpatient Hospital Stay: Payer: Medicare HMO

## 2017-12-10 ENCOUNTER — Inpatient Hospital Stay: Payer: Medicare HMO | Attending: Nurse Practitioner | Admitting: Oncology

## 2017-12-10 VITALS — BP 202/97 | HR 66 | Temp 98.7°F | Resp 18 | Ht 65.0 in | Wt 175.5 lb

## 2017-12-10 VITALS — BP 182/82 | HR 57 | Temp 98.7°F

## 2017-12-10 DIAGNOSIS — D630 Anemia in neoplastic disease: Secondary | ICD-10-CM | POA: Diagnosis not present

## 2017-12-10 DIAGNOSIS — C9000 Multiple myeloma not having achieved remission: Secondary | ICD-10-CM

## 2017-12-10 DIAGNOSIS — Z7901 Long term (current) use of anticoagulants: Secondary | ICD-10-CM | POA: Insufficient documentation

## 2017-12-10 DIAGNOSIS — Z86718 Personal history of other venous thrombosis and embolism: Secondary | ICD-10-CM | POA: Insufficient documentation

## 2017-12-10 DIAGNOSIS — C9001 Multiple myeloma in remission: Secondary | ICD-10-CM

## 2017-12-10 DIAGNOSIS — I252 Old myocardial infarction: Secondary | ICD-10-CM | POA: Insufficient documentation

## 2017-12-10 LAB — CBC WITH DIFFERENTIAL (CANCER CENTER ONLY)
BASOS ABS: 0 10*3/uL (ref 0.0–0.1)
Basophils Relative: 1 %
EOS ABS: 0.1 10*3/uL (ref 0.0–0.5)
EOS PCT: 2 %
HCT: 35.3 % (ref 34.8–46.6)
Hemoglobin: 11 g/dL — ABNORMAL LOW (ref 11.6–15.9)
Lymphocytes Relative: 22 %
Lymphs Abs: 1.3 10*3/uL (ref 0.9–3.3)
MCH: 28.3 pg (ref 25.1–34.0)
MCHC: 31.2 g/dL — ABNORMAL LOW (ref 31.5–36.0)
MCV: 90.7 fL (ref 79.5–101.0)
Monocytes Absolute: 0.6 10*3/uL (ref 0.1–0.9)
Monocytes Relative: 10 %
Neutro Abs: 4.1 10*3/uL (ref 1.5–6.5)
Neutrophils Relative %: 65 %
PLATELETS: 215 10*3/uL (ref 145–400)
RBC: 3.89 MIL/uL (ref 3.70–5.45)
RDW: 18.1 % — ABNORMAL HIGH (ref 11.2–14.5)
WBC: 6.2 10*3/uL (ref 3.9–10.3)

## 2017-12-10 LAB — CMP (CANCER CENTER ONLY)
ALT: 14 U/L (ref 0–55)
AST: 18 U/L (ref 5–34)
Albumin: 3.9 g/dL (ref 3.5–5.0)
Alkaline Phosphatase: 142 U/L (ref 40–150)
Anion gap: 10 (ref 3–11)
BUN: 24 mg/dL (ref 7–26)
CO2: 20 mmol/L — ABNORMAL LOW (ref 22–29)
CREATININE: 1.31 mg/dL — AB (ref 0.60–1.10)
Calcium: 9.4 mg/dL (ref 8.4–10.4)
Chloride: 115 mmol/L — ABNORMAL HIGH (ref 98–109)
GFR, EST NON AFRICAN AMERICAN: 40 mL/min — AB (ref >60)
GFR, Est AFR Am: 47 mL/min — ABNORMAL LOW (ref >60)
Glucose, Bld: 96 mg/dL (ref 70–140)
Potassium: 4.3 mmol/L (ref 3.5–5.1)
Sodium: 145 mmol/L (ref 136–145)
TOTAL PROTEIN: 6.8 g/dL (ref 6.4–8.3)
Total Bilirubin: 0.6 mg/dL (ref 0.2–1.2)

## 2017-12-10 MED ORDER — ZOLEDRONIC ACID 4 MG/100ML IV SOLN: 4.0000 mg | Freq: Once | INTRAVENOUS | Status: DC

## 2017-12-10 MED ORDER — SODIUM CHLORIDE 0.9 % IV SOLN
Freq: Once | INTRAVENOUS | Status: DC
  Administered 2017-12-10: 17:00:00 via INTRAVENOUS

## 2017-12-10 NOTE — Progress Notes (Signed)
[facility] OFFICE PROGRESS NOTE   Diagnosis: Multiple myeloma  INTERVAL HISTORY:   Ms. Mauck returns as scheduled.  She remains off of specific treatment for myeloma.  She complains of malaise and dyspnea.  She was recently treated for "bronchitis ".  She has aching in both legs.  Objective:  Vital signs in last 24 hours:  Blood pressure (!) 203/88, pulse 66, temperature 98.7 F (37.1 C), temperature source Oral, resp. rate 18, height _0  (1.651 m), weight 175 lb 8 oz (79.6 kg), SpO2 100 %.   Resp: Lungs clear bilaterally, no respiratory distress Cardio: Regular rate and rhythm GI: No hepatosplenomegaly, nontender Vascular: Trace lower leg edema bilaterally  Lab Results:  Lab Results  Component Value Date   WBC 6.2 12/10/2017   HGB 11.7 10/29/2017   HCT 35.3 12/10/2017   MCV 90.7 12/10/2017   PLT 215 12/10/2017   NEUTROABS 4.1 12/10/2017    CMP     Component Value Date/Time   NA 145 12/10/2017 1445   NA 144 09/17/2017 1429   K 4.3 12/10/2017 1445   K 3.1 (L) 09/17/2017 1429   CL 115 (H) 12/10/2017 1445   CO2 20 (L) 12/10/2017 1445   CO2 23 09/17/2017 1429   GLUCOSE 96 12/10/2017 1445   GLUCOSE 96 09/17/2017 1429   BUN 24 12/10/2017 1445   BUN 15.6 09/17/2017 1429   CREATININE 1.31 (H) 12/10/2017 1445   CREATININE 1.0 09/17/2017 1429   CALCIUM 9.4 12/10/2017 1445   CALCIUM 8.6 09/17/2017 1429   PROT 6.8 12/10/2017 1445   PROT 6.6 09/17/2017 1429   ALBUMIN 3.9 12/10/2017 1445   ALBUMIN 3.6 09/17/2017 1429   AST 18 12/10/2017 1445   AST 14 09/17/2017 1429   ALT 14 12/10/2017 1445   ALT 11 09/17/2017 1429   ALKPHOS 142 12/10/2017 1445   ALKPHOS 93 09/17/2017 1429   BILITOT 0.6 12/10/2017 1445   BILITOT 1.22 (H) 09/17/2017 1429   GFRNONAA 40 (L) 12/10/2017 1445   GFRAA 47 (L) 12/10/2017 1445    10/29/2017: Serum M spike 0.2, lambda free light chains 12.8, IgG 579 Medications: I have reviewed the patient's current  medications.   Assessment/Plan: 1. Multiple myeloma, IgG lambda, bone marrow biopsy 06/15/2016 confirmed multiple myeloma; cytogenetics by FISHshow +11, +12, 13q-  Elevated serum free lambda light chains  Lambda light chain proteinuria  Lytic bone lesions on a bone survey 06/13/2016  Initiation of weekly Velcade/Decadron 06/19/2016  Serum light chains improved 07/31/2016  Initiation of Revlimid 11/03/20172 weeks on/2 weeks off  Serum M spike and IgG significantly improved 09/25/2016  Velcade held on 10/30/2016 due to urinary retention  Treatment resumed with Revlimid (2 weeks on/2 weeks off) and weekly Decadron on 02/12/2017, treatment placed on hold May 2018 secondary to admissions with cardiac disease  2. Severe anemia secondary to #1-markedly improved  3. Diffuse lytic bone lesions secondary to multiple myeloma, status post Zometa 09/18/2016(plan to continue every 3 months); most recent Zometa infusion 10/08/2017  4. History of coronary artery disease/myocardial infarction  5. History of colon polyps  6. Bilateral leg edema 09/04/2016. Negative bilateral venous Doppler 09/05/2016.  7. Mild periorbital edema 09/18/2016, question related to early stye formation, question Velcade related chalazia.Improved.  8. CT scan 10/27/2016 with a new right kidney massevaluated by urology; MRI abdomen 11/18/2016 multiple bilateral renal cysts. Most of these are simple cysts. The 2 lesions in question on the CT scan are both hemorrhagic or proteinaceous cysts. No worrisome contrast  enhancement.  9. Urinary retention 10/28/2016 status post evaluation in the emergency department. Urinalysis negative for signs of infection 10/30/2016. Velcade held. Resolved.  10. Abdominal pain, nausea, vomiting.Etiology unclear. Upper endoscopy 11/18/2016 with findings of reflux esophagitis, benign appearing esophageal stenosis, small hiatal hernia and erythematous duodenopathy. CT  abdomen/pelvis 11/19/2016 with no acute abnormality.  11. 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 Left lower extremity DVT May 2018.Maintainedon Coumadin.Coumadin discontinued October 2018 secondary to a supratherapeutic PT/INR and GI bleeding  13.Admission with GI bleeding October and November 2018-felt secondary to diverticular bleeding, initially while on supratherapeutic Coumadin, colonoscopy 09/04/2017-no source of blood loss identified, superficial tear at the posterior anal mucosa without stigmata of bleeding    Disposition: Ms. Kary has multiple myeloma.  There has been no clinical or laboratory evidence of disease progression.  She remains off of specific therapy for myeloma.  We will follow-up on the myeloma panel from today.  She will return for an office and lab visit in 6 weeks. She will receive Zometa today.  I recommend she follow-up with her primary physician or Dr. Doylene Canard to evaluate hypertension.  15 minutes were spent with the patient today.  The majority of the time was used for counseling and coordination of care.  [name], MD  12/10/2017  3:55 PM

## 2017-12-10 NOTE — Patient Instructions (Signed)

## 2017-12-11 ENCOUNTER — Telehealth: Payer: Self-pay | Admitting: Oncology

## 2017-12-11 LAB — PROTEIN ELECTROPHORESIS, SERUM
A/G Ratio: 1.3 (ref 0.7–1.7)
ALPHA-1-GLOBULIN: 0.3 g/dL (ref 0.0–0.4)
ALPHA-2-GLOBULIN: 0.9 g/dL (ref 0.4–1.0)
Albumin ELP: 3.7 g/dL (ref 2.9–4.4)
BETA GLOBULIN: 1 g/dL (ref 0.7–1.3)
GAMMA GLOBULIN: 0.7 g/dL (ref 0.4–1.8)
Globulin, Total: 2.9 g/dL (ref 2.2–3.9)
M-SPIKE, %: 0.3 g/dL — AB
TOTAL PROTEIN ELP: 6.6 g/dL (ref 6.0–8.5)

## 2017-12-11 LAB — IGG: IGG (IMMUNOGLOBIN G), SERUM: 726 mg/dL (ref 700–1600)

## 2017-12-11 LAB — KAPPA/LAMBDA LIGHT CHAINS
KAPPA FREE LGHT CHN: 8.7 mg/L (ref 3.3–19.4)
Kappa, lambda light chain ratio: 0.67 (ref 0.26–1.65)
Lambda free light chains: 12.9 mg/L (ref 5.7–26.3)

## 2017-12-11 NOTE — Telephone Encounter (Signed)
Scheduled appt per 2/21 los - sent reminder letter in the mail.

## 2017-12-13 ENCOUNTER — Other Ambulatory Visit: Payer: Self-pay

## 2017-12-13 ENCOUNTER — Emergency Department (HOSPITAL_COMMUNITY): Payer: Medicare HMO

## 2017-12-13 ENCOUNTER — Encounter (HOSPITAL_COMMUNITY): Payer: Self-pay | Admitting: Emergency Medicine

## 2017-12-13 DIAGNOSIS — R0789 Other chest pain: Secondary | ICD-10-CM | POA: Diagnosis not present

## 2017-12-13 DIAGNOSIS — C9001 Multiple myeloma in remission: Secondary | ICD-10-CM | POA: Diagnosis present

## 2017-12-13 DIAGNOSIS — I249 Acute ischemic heart disease, unspecified: Principal | ICD-10-CM | POA: Diagnosis present

## 2017-12-13 DIAGNOSIS — Z9841 Cataract extraction status, right eye: Secondary | ICD-10-CM

## 2017-12-13 DIAGNOSIS — Z87891 Personal history of nicotine dependence: Secondary | ICD-10-CM

## 2017-12-13 DIAGNOSIS — Z955 Presence of coronary angioplasty implant and graft: Secondary | ICD-10-CM

## 2017-12-13 DIAGNOSIS — K219 Gastro-esophageal reflux disease without esophagitis: Secondary | ICD-10-CM | POA: Diagnosis present

## 2017-12-13 DIAGNOSIS — Z961 Presence of intraocular lens: Secondary | ICD-10-CM | POA: Diagnosis present

## 2017-12-13 DIAGNOSIS — N183 Chronic kidney disease, stage 3 (moderate): Secondary | ICD-10-CM | POA: Diagnosis present

## 2017-12-13 DIAGNOSIS — F329 Major depressive disorder, single episode, unspecified: Secondary | ICD-10-CM | POA: Diagnosis present

## 2017-12-13 DIAGNOSIS — Z86718 Personal history of other venous thrombosis and embolism: Secondary | ICD-10-CM

## 2017-12-13 DIAGNOSIS — Z90710 Acquired absence of both cervix and uterus: Secondary | ICD-10-CM

## 2017-12-13 DIAGNOSIS — R001 Bradycardia, unspecified: Secondary | ICD-10-CM | POA: Diagnosis present

## 2017-12-13 DIAGNOSIS — I2510 Atherosclerotic heart disease of native coronary artery without angina pectoris: Secondary | ICD-10-CM | POA: Diagnosis present

## 2017-12-13 DIAGNOSIS — I129 Hypertensive chronic kidney disease with stage 1 through stage 4 chronic kidney disease, or unspecified chronic kidney disease: Secondary | ICD-10-CM | POA: Diagnosis present

## 2017-12-13 DIAGNOSIS — E785 Hyperlipidemia, unspecified: Secondary | ICD-10-CM | POA: Diagnosis present

## 2017-12-13 DIAGNOSIS — I252 Old myocardial infarction: Secondary | ICD-10-CM

## 2017-12-13 LAB — I-STAT TROPONIN, ED: TROPONIN I, POC: 0.02 ng/mL (ref 0.00–0.08)

## 2017-12-13 LAB — BASIC METABOLIC PANEL
Anion gap: 13 (ref 5–15)
BUN: 29 mg/dL — ABNORMAL HIGH (ref 6–20)
CALCIUM: 10.2 mg/dL (ref 8.9–10.3)
CO2: 22 mmol/L (ref 22–32)
CREATININE: 1.56 mg/dL — AB (ref 0.44–1.00)
Chloride: 108 mmol/L (ref 101–111)
GFR, EST AFRICAN AMERICAN: 38 mL/min — AB (ref >60)
GFR, EST NON AFRICAN AMERICAN: 33 mL/min — AB (ref >60)
Glucose, Bld: 102 mg/dL — ABNORMAL HIGH (ref 65–99)
Potassium: 4 mmol/L (ref 3.5–5.1)
SODIUM: 143 mmol/L (ref 135–145)

## 2017-12-13 LAB — CBC
HCT: 38.6 % (ref 36.0–46.0)
HEMOGLOBIN: 11.9 g/dL — AB (ref 12.0–15.0)
MCH: 28.3 pg (ref 26.0–34.0)
MCHC: 30.8 g/dL (ref 30.0–36.0)
MCV: 91.9 fL (ref 78.0–100.0)
PLATELETS: 242 10*3/uL (ref 150–400)
RBC: 4.2 MIL/uL (ref 3.87–5.11)
RDW: 17.6 % — ABNORMAL HIGH (ref 11.5–15.5)
WBC: 7 10*3/uL (ref 4.0–10.5)

## 2017-12-13 NOTE — ED Triage Notes (Signed)
Pt reporting chest pain with radiation to her arm and neck and shortness of breath. Pt states she had an episode of the same earlier today but it subsided with her lasix.

## 2017-12-14 ENCOUNTER — Other Ambulatory Visit: Payer: Self-pay

## 2017-12-14 ENCOUNTER — Inpatient Hospital Stay (HOSPITAL_COMMUNITY)
Admission: EM | Admit: 2017-12-14 | Discharge: 2017-12-17 | DRG: 311 | Disposition: A | Discharge status: Home / Self Care | Payer: Medicare HMO | Attending: Cardiovascular Disease | Admitting: Cardiovascular Disease

## 2017-12-14 DIAGNOSIS — Z9841 Cataract extraction status, right eye: Secondary | ICD-10-CM | POA: Diagnosis not present

## 2017-12-14 DIAGNOSIS — I252 Old myocardial infarction: Secondary | ICD-10-CM | POA: Diagnosis not present

## 2017-12-14 DIAGNOSIS — Z961 Presence of intraocular lens: Secondary | ICD-10-CM | POA: Diagnosis present

## 2017-12-14 DIAGNOSIS — C9001 Multiple myeloma in remission: Secondary | ICD-10-CM | POA: Diagnosis present

## 2017-12-14 DIAGNOSIS — Z955 Presence of coronary angioplasty implant and graft: Secondary | ICD-10-CM | POA: Diagnosis not present

## 2017-12-14 DIAGNOSIS — R0789 Other chest pain: Secondary | ICD-10-CM | POA: Diagnosis present

## 2017-12-14 DIAGNOSIS — K219 Gastro-esophageal reflux disease without esophagitis: Secondary | ICD-10-CM | POA: Diagnosis present

## 2017-12-14 DIAGNOSIS — I2510 Atherosclerotic heart disease of native coronary artery without angina pectoris: Secondary | ICD-10-CM | POA: Diagnosis present

## 2017-12-14 DIAGNOSIS — Z86718 Personal history of other venous thrombosis and embolism: Secondary | ICD-10-CM | POA: Diagnosis not present

## 2017-12-14 DIAGNOSIS — N183 Chronic kidney disease, stage 3 (moderate): Secondary | ICD-10-CM | POA: Diagnosis present

## 2017-12-14 DIAGNOSIS — I249 Acute ischemic heart disease, unspecified: Secondary | ICD-10-CM | POA: Diagnosis present

## 2017-12-14 DIAGNOSIS — E785 Hyperlipidemia, unspecified: Secondary | ICD-10-CM | POA: Diagnosis present

## 2017-12-14 DIAGNOSIS — Z90710 Acquired absence of both cervix and uterus: Secondary | ICD-10-CM | POA: Diagnosis not present

## 2017-12-14 DIAGNOSIS — I129 Hypertensive chronic kidney disease with stage 1 through stage 4 chronic kidney disease, or unspecified chronic kidney disease: Secondary | ICD-10-CM | POA: Diagnosis present

## 2017-12-14 DIAGNOSIS — Z87891 Personal history of nicotine dependence: Secondary | ICD-10-CM | POA: Diagnosis not present

## 2017-12-14 DIAGNOSIS — R001 Bradycardia, unspecified: Secondary | ICD-10-CM | POA: Diagnosis present

## 2017-12-14 DIAGNOSIS — F329 Major depressive disorder, single episode, unspecified: Secondary | ICD-10-CM | POA: Diagnosis present

## 2017-12-14 LAB — TROPONIN I
TROPONIN I: 0.03 ng/mL — AB (ref <0.03)
Troponin I: 0.03 ng/mL (ref <0.03)
Troponin I: <0.03 ng/mL (ref <0.03)

## 2017-12-14 LAB — LIPID PANEL
CHOLESTEROL: 167 mg/dL (ref 0–200)
HDL: 95 mg/dL (ref >40)
LDL CALC: 57 mg/dL (ref 0–99)
TRIGLYCERIDES: 76 mg/dL (ref <150)
Total CHOL/HDL Ratio: 1.8 RATIO
VLDL: 15 mg/dL (ref 0–40)

## 2017-12-14 LAB — TSH: TSH: 1.922 u[IU]/mL (ref 0.350–4.500)

## 2017-12-14 LAB — T4, FREE: FREE T4: 1.38 ng/dL — AB (ref 0.61–1.12)

## 2017-12-14 LAB — MAGNESIUM: Magnesium: 1.8 mg/dL (ref 1.7–2.4)

## 2017-12-14 LAB — BRAIN NATRIURETIC PEPTIDE: B Natriuretic Peptide: 994.5 pg/mL — ABNORMAL HIGH (ref 0.0–100.0)

## 2017-12-14 LAB — HEPARIN LEVEL (UNFRACTIONATED): Heparin Unfractionated: 1.1 IU/mL — ABNORMAL HIGH (ref 0.30–0.70)

## 2017-12-14 MED ORDER — FERROUS SULFATE 325 (65 FE) MG PO TABS
325.0000 mg | ORAL_TABLET | Freq: Every day | ORAL | Status: DC
  Administered 2017-12-15 – 2017-12-17 (×3): 325 mg via ORAL
  Filled 2017-12-14 (×3): qty 1

## 2017-12-14 MED ORDER — HEPARIN BOLUS VIA INFUSION
3000.0000 [IU] | Freq: Once | INTRAVENOUS | Status: AC
  Administered 2017-12-14: 3000 [IU] via INTRAVENOUS
  Filled 2017-12-14: qty 3000

## 2017-12-14 MED ORDER — ATROPINE SULFATE 1 MG/10ML IJ SOSY
PREFILLED_SYRINGE | INTRAMUSCULAR | Status: AC
  Administered 2017-12-15: 0.5 mg via INTRAVENOUS
  Filled 2017-12-14: qty 10

## 2017-12-14 MED ORDER — ONDANSETRON HCL 4 MG/2ML IJ SOLN: 4.0000 mg | Freq: Four times a day (QID) | INTRAMUSCULAR | Status: DC | PRN

## 2017-12-14 MED ORDER — HEPARIN (PORCINE) IN NACL 100-0.45 UNIT/ML-% IJ SOLN
1000.0000 [IU]/h | INTRAMUSCULAR | Status: DC
  Administered 2017-12-14: 1000 [IU]/h via INTRAVENOUS
  Filled 2017-12-14: qty 250

## 2017-12-14 MED ORDER — ACETAMINOPHEN 325 MG PO TABS
650.0000 mg | ORAL_TABLET | ORAL | Status: DC | PRN
  Administered 2017-12-14 – 2017-12-17 (×8): 650 mg via ORAL
  Filled 2017-12-14 (×8): qty 2

## 2017-12-14 MED ORDER — TICAGRELOR 60 MG PO TABS
60.0000 mg | ORAL_TABLET | Freq: Two times a day (BID) | ORAL | Status: DC
  Administered 2017-12-14 – 2017-12-17 (×7): 60 mg via ORAL
  Filled 2017-12-14 (×8): qty 1

## 2017-12-14 MED ORDER — HEPARIN (PORCINE) IN NACL 100-0.45 UNIT/ML-% IJ SOLN
750.0000 [IU]/h | INTRAMUSCULAR | Status: DC
  Administered 2017-12-15: 800 [IU]/h via INTRAVENOUS
  Filled 2017-12-14: qty 250

## 2017-12-14 MED ORDER — VITAMIN C 500 MG PO TABS
1000.0000 mg | ORAL_TABLET | Freq: Every day | ORAL | Status: DC
  Administered 2017-12-14 – 2017-12-17 (×4): 1000 mg via ORAL
  Filled 2017-12-14 (×5): qty 2

## 2017-12-14 MED ORDER — HYDRALAZINE HCL 25 MG PO TABS
25.0000 mg | ORAL_TABLET | Freq: Four times a day (QID) | ORAL | Status: DC
  Administered 2017-12-14 – 2017-12-17 (×12): 25 mg via ORAL
  Filled 2017-12-14 (×11): qty 1

## 2017-12-14 MED ORDER — PYRIDOSTIGMINE BROMIDE 60 MG PO TABS
30.0000 mg | ORAL_TABLET | Freq: Two times a day (BID) | ORAL | Status: DC
  Administered 2017-12-14 – 2017-12-17 (×7): 30 mg via ORAL
  Filled 2017-12-14 (×8): qty 0.5

## 2017-12-14 MED ORDER — ASPIRIN 81 MG PO CHEW
324.0000 mg | CHEWABLE_TABLET | Freq: Once | ORAL | Status: AC
  Administered 2017-12-14: 324 mg via ORAL
  Filled 2017-12-14: qty 4

## 2017-12-14 MED ORDER — ACYCLOVIR 400 MG PO TABS
400.0000 mg | ORAL_TABLET | Freq: Two times a day (BID) | ORAL | Status: DC
  Administered 2017-12-14 – 2017-12-17 (×7): 400 mg via ORAL
  Filled 2017-12-14 (×8): qty 1

## 2017-12-14 MED ORDER — PANTOPRAZOLE SODIUM 40 MG PO TBEC
40.0000 mg | DELAYED_RELEASE_TABLET | Freq: Every day | ORAL | Status: DC
  Administered 2017-12-14 – 2017-12-17 (×4): 40 mg via ORAL
  Filled 2017-12-14 (×4): qty 1

## 2017-12-14 MED ORDER — POTASSIUM CHLORIDE CRYS ER 20 MEQ PO TBCR
20.0000 meq | EXTENDED_RELEASE_TABLET | Freq: Every day | ORAL | Status: DC
  Administered 2017-12-14 – 2017-12-17 (×4): 20 meq via ORAL
  Filled 2017-12-14 (×4): qty 1

## 2017-12-14 MED ORDER — NITROGLYCERIN 0.4 MG SL SUBL: 0.4000 mg | SUBLINGUAL_TABLET | SUBLINGUAL | Status: DC | PRN

## 2017-12-14 MED ORDER — ATORVASTATIN CALCIUM 80 MG PO TABS
80.0000 mg | ORAL_TABLET | Freq: Every day | ORAL | Status: DC
  Administered 2017-12-14 – 2017-12-16 (×3): 80 mg via ORAL
  Filled 2017-12-14 (×5): qty 1

## 2017-12-14 MED ORDER — ATROPINE SULFATE 1 MG/10ML IJ SOSY
1.0000 mg | PREFILLED_SYRINGE | Freq: Once | INTRAMUSCULAR | Status: AC
  Administered 2017-12-14: 1 mg via INTRAVENOUS

## 2017-12-14 MED ORDER — ASPIRIN EC 81 MG PO TBEC
81.0000 mg | DELAYED_RELEASE_TABLET | Freq: Every day | ORAL | Status: DC
  Administered 2017-12-15 – 2017-12-17 (×3): 81 mg via ORAL
  Filled 2017-12-14 (×3): qty 1

## 2017-12-14 NOTE — ED Notes (Signed)
Ordered dinner tray.  

## 2017-12-14 NOTE — ED Notes (Signed)
Pt remains in waiting room. Updated on wait for treatment room. Pt given warm blanket. 

## 2017-12-14 NOTE — ED Notes (Signed)
Pt remains in waiting room. Updated on wait for treatment room. 

## 2017-12-14 NOTE — Progress Notes (Signed)
ANTICOAGULATION CONSULT NOTE - Initial Consult  Pharmacy Consult for heparin Indication: chest pain/ACS  Allergies  Allergen Reactions  . Sulfa Antibiotics Rash    Patient Measurements: Height: '5\' 5"'  (165.1 cm) Weight: 175 lb (79.4 kg) IBW/kg (Calculated) : 57 Heparin Dosing Weight: 75kg  Vital Signs: Temp: 98.4 F (36.9 C) (02/25 0343) Temp Source: Oral (02/25 0343) BP: 165/85 (02/25 0415) Pulse Rate: 58 (02/25 0442)  Labs: Recent Labs    12/13/17 2243  HGB 11.9*  HCT 38.6  PLT 242  CREATININE 1.56*    Estimated Creatinine Clearance: 35 mL/min (A) (by C-G formula based on SCr of 1.56 mg/dL (H)).   Medical History: Past Medical History:  Diagnosis Date  . ACS (acute coronary syndrome) ([place]) 06/2017  . Anemia    "off and on all my life" (12/22/2016)  . Anxiety   . Bilateral renal artery stenosis ([place]) 1999   s/p stenting  . Chronic kidney disease (CKD), stage III (moderate) ([place])    Archie Endo 12/22/2016  . Coronary artery disease 1999  . Depression   . Diverticulosis   . Duodenitis   . DVT (deep venous thrombosis) ([facility])    on Coumadin  . Dyspnea   . Gastric erosions   . Gastric ulcer   . GERD (gastroesophageal reflux disease)   . Heart murmur   . History of blood transfusion ~ 03/2016   "low HgB; practically nonexistent"  . Hyperlipidemia   . Hypertension   . MI (myocardial infarction) ([place]) 1999  . Multiple myeloma ([place]) dx'd ~ 04/2016  . PAT (paroxysmal atrial tachycardia) ([place]) 12/22/2016  . Retinal hemorrhage, right eye   . Schatzki's ring   . Tachy-brady syndrome ([place])    Archie Endo 12/22/2016  . Tubular adenoma of colon     Assessment: 72yo female c/o CP radiating to UE/neck and associated w/ SOB, istat troponin negative, to begin heparin.  Goal of Therapy:  Heparin level 0.3-0.7 units/ml Monitor platelets by anticoagulation protocol: Yes   Plan:  Will give heparin 3000 units IV bolus x1 followed by gtt at 1000 units/hr and monitor heparin levels  and CBC.  Wynona Neat, PharmD, BCPS  12/14/2017,5:05 AM

## 2017-12-14 NOTE — ED Provider Notes (Addendum)
TIME SEEN: 3:58 AM  CHIEF COMPLAINT: Chest pain and shortness of breath  HPI: Patient is a 72 year old female with history of stents on Brilinta, chronic kidney disease, multiple myeloma in remission, paroxysmal atrial tachycardia on amiodarone and not on anticoagulation symptoms started at 7 AM yesterday morning and then resolved after taking Lasix.  Came back around 6 PM tonight.  No other aggravating or relieving factors.  Feels similar to her anginal equivalent.  No fever or cough.  No lower extremity swelling or pain.  Has history of tachy brady syndrome and states that there have been previous discussions about a possible pacemaker.  She does not think she is on a beta-blocker.  ROS: See HPI Constitutional: no fever  Eyes: no drainage  ENT: no runny nose   Cardiovascular:   chest pain  Resp:  SOB  GI: no vomiting GU: no dysuria Integumentary: no rash  Allergy: no hives  Musculoskeletal: no leg swelling  Neurological: no slurred speech ROS otherwise negative  PAST MEDICAL HISTORY/PAST SURGICAL HISTORY:  Past Medical History:  Diagnosis Date  . ACS (acute coronary syndrome) ([place]) 06/2017  . Anemia    "off and on all my life" (12/22/2016)  . Anxiety   . Bilateral renal artery stenosis ([place]) 1999   s/p stenting  . Chronic kidney disease (CKD), stage III (moderate) ([place])    Archie Endo 12/22/2016  . Coronary artery disease 1999  . Depression   . Diverticulosis   . Duodenitis   . DVT (deep venous thrombosis) ([facility])    on Coumadin  . Dyspnea   . Gastric erosions   . Gastric ulcer   . GERD (gastroesophageal reflux disease)   . Heart murmur   . History of blood transfusion ~ 03/2016   "low HgB; practically nonexistent"  . Hyperlipidemia   . Hypertension   . MI (myocardial infarction) ([place]) 1999  . Multiple myeloma ([place]) dx'd ~ 04/2016  . PAT (paroxysmal atrial tachycardia) ([place]) 12/22/2016  . Retinal hemorrhage, right eye   . Schatzki's ring   . Tachy-brady syndrome ([place])     Archie Endo 12/22/2016  . Tubular adenoma of colon     MEDICATIONS:  Prior to Admission medications   Medication Sig Start Date End Date Taking? Authorizing Provider  acyclovir (ZOVIRAX) 400 MG tablet TAKE 1 TABLET (400 MG TOTAL) BY MOUTH 2 (TWO) TIMES DAILY. 08/29/16   Ladell Pier, MD  amiodarone (PACERONE) 200 MG tablet Take 0.5 tablets (100 mg total) by mouth daily. 03/12/17   [name], MD  Ascorbic Acid (VITAMIN C) 1000 MG tablet Take 1,000 mg by mouth daily.     [provider]  aspirin EC 81 MG tablet Take 1 tablet (81 mg total) by mouth daily. 08/21/17   Bonnielee Haff, MD  atorvastatin (LIPITOR) 80 MG tablet Take 1 tablet (80 mg total) by mouth daily at 6 PM. 03/01/17   [name], MD  calcium carbonate (OS-CAL - DOSED IN MG OF ELEMENTAL CALCIUM) 1250 (500 Ca) MG tablet Take 1 tablet by mouth daily with breakfast.    [provider]  Cholecalciferol (VITAMIN D PO) Take 1 capsule by mouth daily.    [provider]  ferrous sulfate 325 (65 FE) MG tablet Take 1 tablet (325 mg total) by mouth daily with breakfast. 03/02/17   [name], MD  furosemide (LASIX) 40 MG tablet TAKE 1 TABLET (40 MG TOTAL) BY MOUTH EVERY MONDAY, WEDNESDAY, AND FRIDAY. 09/18/17   [provider]  gabapentin (NEURONTIN) 100  MG capsule Take 1 capsule (100 mg total) by mouth 3 (three) times daily. Patient not taking: Reported on 11/12/2017 08/21/17   Bonnielee Haff, MD  ibuprofen (ADVIL,MOTRIN) 200 MG tablet Take 200 mg by mouth every 6 (six) hours as needed.    [provider]  nitroGLYCERIN (NITROSTAT) 0.4 MG SL tablet Place 0.4 mg under the tongue every 5 (five) minutes as needed for chest pain. 03/20/17   [provider]  pantoprazole (PROTONIX) 40 MG tablet Take 1 tablet (40 mg total) by mouth daily. 03/12/17   [name], MD  potassium chloride SA (K-DUR,KLOR-CON) 20 MEQ tablet Take 1 tablet (20 mEq total) by mouth daily. 10/23/17   Ladell Pier, MD   pyridostigmine (MESTINON) 60 MG tablet Take 0.5 tablets (30 mg total) by mouth 2 (two) times daily. 01/25/17   [name], MD  ticagrelor (BRILINTA) 60 MG TABS tablet Take 60 mg 2 (two) times daily by mouth.    [provider]  atenolol (TENORMIN) 25 MG tablet Take 25 mg by mouth daily.  01/02/12  [provider]  cloNIDine (CATAPRES) 0.1 MG tablet Take 0.1 mg by mouth daily.   01/02/12  [provider]  ISOSORBIDE DINITRATE PO Take by mouth.  12/30/11  [provider]    ALLERGIES:  Allergies  Allergen Reactions  . Sulfa Antibiotics Rash    SOCIAL HISTORY:  Social History   Tobacco Use  . Smoking status: Former Smoker    Packs/day: 0.50    Years: 50.00    Pack years: 25.00    Types: Cigarettes    Last attempt to quit: 11/20/2016    Years since quitting: 1.0  . Smokeless tobacco: Never Used  Substance Use Topics  . Alcohol use: No    Alcohol/week: 0.0 oz    FAMILY HISTORY: Family History  Problem Relation Age of Onset  . Breast cancer Mother   . Breast cancer Sister   . Breast cancer Maternal Aunt   . Colon cancer Neg Hx     EXAM: BP (!) 159/64 (BP Location: Right Arm)   Pulse (!) 39   Temp 98.4 F (36.9 C) (Oral)   Resp 14   Ht '5\' 5"'  (1.651 m)   Wt 79.4 kg (175 lb)   SpO2 98%   BMI 29.12 kg/m  CONSTITUTIONAL: Alert and oriented and responds appropriately to questions. Well-appearing; well-nourished HEAD: Normocephalic EYES: Conjunctivae clear, pupils appear equal, EOMI ENT: normal nose; moist mucous membranes NECK: Supple, no meningismus, no nuchal rigidity, no LAD  CARD: RRR; S1 and S2 appreciated; no murmurs, no clicks, no rubs, no gallops RESP: Normal chest excursion without splinting or tachypnea; breath sounds clear and equal bilaterally; no wheezes, no rhonchi, no rales, no hypoxia or respiratory distress, speaking full sentences ABD/GI: Normal bowel sounds; non-distended; soft, non-tender, no rebound, no guarding, no  peritoneal signs, no hepatosplenomegaly BACK:  The back appears normal and is non-tender to palpation, there is no CVA tenderness EXT: Normal ROM in all joints; non-tender to palpation; no edema; normal capillary refill; no cyanosis, no calf tenderness or swelling    SKIN: Normal color for age and race; warm; no rash NEURO: Moves all extremities equally PSYCH: The patient's mood and manner are appropriate. Grooming and personal hygiene are appropriate.  MEDICAL DECISION MAKING: Patient here with chest pain shortness of breath.  Has a cardiac history.  States this feels similar to her anginal equivalent.  She is bradycardic here with a heart rate of 38  without any sign of a block or interval abnormality.  Potassium is normal.  Will check magnesium level.  First troponin negative.  Chest x-ray shows cardiomegaly without pulmonary edema, pneumothorax, opacity.  Given 1 mg of IV atropine and heart rate has improved into the 80s in a sinus rhythm.  With heart rate in 30s she was mentating normally and had normal blood pressure.  Will discuss with her cardiologist.  Pacer pads have been placed on patient.  ED PROGRESS:     4:18 AM Discussed patient's case with cardiologist, Dr. Doylene Canard.  I have recommended admission and patient (and family if present) agree with this plan. Admitting physician will place admission orders.  He will see patient in the emergency department now.  I reviewed all nursing notes, vitals, pertinent previous records, EKGs, lab and urine results, imaging (as available).    Angiocath insertion Performed by: Cyril Mourning Sky Primo  Consent: Verbal consent obtained. Risks and benefits: risks, benefits and alternatives were discussed Time out: Immediately prior to procedure a "time out" was called to verify the correct patient, procedure, equipment, support staff and site/side marked as required.  Preparation: Patient was prepped and draped in the usual sterile fashion.  Vein Location:  right EJ  NotUltrasound Guided  Gauge: 18  Normal blood return and flush without difficulty Patient tolerance: Patient tolerated the procedure well with no immediate complications.       EKG Interpretation  Date/Time:  Sunday December 13 2017 22:42:24 EST Ventricular Rate:  50 PR Interval:    QRS Duration: 80 QT Interval:  428 QTC Calculation: 390 R Axis:   -48 Text Interpretation:  Sinus bradycardia with 1st degree A-V block Left axis deviation Septal infarct , age undetermined Abnormal ECG Confirmed by Pryor Curia [phone]) on 12/14/2017 4:21:57 AM         EKG Interpretation  Date/Time:  Monday December 14 2017 04:01:56 EST Ventricular Rate:  49 PR Interval:    QRS Duration: 269 QT Interval:  536 QTC Calculation: 484 R Axis:   94 Text Interpretation:  Sinus bradycardia Atrial premature complex Nonspecific intraventricular conduction delay Anteroseptal infarct, old Confirmed by Anaston Koehn, Cyril Mourning [phone]) on 12/14/2017 4:22:26 AM         CRITICAL CARE Performed by: Cyril Mourning Sim [place]   Total critical care time: 50 minutes  Critical care time was exclusive of separately billable procedures and treating other patients.  Critical care was necessary to treat or prevent imminent or life-threatening deterioration.  Critical care was time spent personally by me on the following activities: development of treatment plan with patient and/or surrogate as well as nursing, discussions with consultants, evaluation of patient's response to treatment, examination of patient, obtaining history from patient or surrogate, ordering and performing treatments and interventions, ordering and review of laboratory studies, ordering and review of radiographic studies, pulse oximetry and re-evaluation of patient's condition.        Conall Vangorder, Delice Bison, DO 12/14/17 [place], Delice Bison, DO 12/14/17 [phone]

## 2017-12-14 NOTE — Progress Notes (Signed)
ANTICOAGULATION CONSULT NOTE - Initial Consult  Pharmacy Consult for heparin Indication: chest pain/ACS  Allergies  Allergen Reactions  . Sulfa Antibiotics Rash    Patient Measurements: Height: 5\' 5"  (165.1 cm) Weight: 165 lb 8 oz (75.1 kg) IBW/kg (Calculated) : 57 Heparin Dosing Weight: 75kg  Vital Signs: Temp: 98.3 F (36.8 C) (02/25 1816) Temp Source: Oral (02/25 1816) BP: 151/73 (02/25 1816) Pulse Rate: 94 (02/25 1816)  Labs: Recent Labs    12/13/17 2243 12/14/17 0456 12/14/17 1049 12/14/17 1824  HGB 11.9*  --   --   --   HCT 38.6  --   --   --   PLT 242  --   --   --   HEPARINUNFRC  --   --   --  1.10*  CREATININE 1.56*  --   --   --   TROPONINI  --  0.03* 0.03* <0.03    Estimated Creatinine Clearance: 34 mL/min (A) (by C-G formula based on SCr of 1.56 mg/dL (H)).  Assessment: 72yo female c/o CP radiating to UE/neck and associated w/ SOB, and started IV heparin earlier today. Heparin level is back 1.1 supratherapeutic on 1000 units/hr. Confirmed with IV and lab level was drew through her R hand and the infusion was going through R jugular vein. No bleeding reported  Goal of Therapy:  Heparin level 0.3-0.7 units/ml Monitor platelets by anticoagulation protocol: Yes   Plan:  Hold heparin for 1 hr Restart heparin 800 units/hr at 2100 F/u AM level  Maryanna Shape, PharmD, BCPS  Clinical Pharmacist  Pager: [phone]   12/14/2017,7:56 PM

## 2017-12-14 NOTE — ED Notes (Signed)
Attempted report 

## 2017-12-14 NOTE — H&P (Signed)
Referring Physician:  GEOFFREY MANKIN is an 72 y.o. female.                       Chief Complaint: Chest pain and shortness of breath  HPI: 72 year old female has 2 day history of recurrent chest pain and shortness of breath. She has PMH of multiple myeloma, LAD and LCX stents, left leg DVT, recurrent GI bleed. She is not on Beta-blocker but she is on Amiodarone. She also reports uncontrolled hypertension for few days.  Past Medical History:  Diagnosis Date  . ACS (acute coronary syndrome) ([place]) 06/2017  . Anemia    "off and on all my life" (12/22/2016)  . Anxiety   . Bilateral renal artery stenosis ([place]) 1999   s/p stenting  . Chronic kidney disease (CKD), stage III (moderate) ([place])    Archie Endo 12/22/2016  . Coronary artery disease 1999  . Depression   . Diverticulosis   . Duodenitis   . DVT (deep venous thrombosis) ([facility])    on Coumadin  . Dyspnea   . Gastric erosions   . Gastric ulcer   . GERD (gastroesophageal reflux disease)   . Heart murmur   . History of blood transfusion ~ 03/2016   "low HgB; practically nonexistent"  . Hyperlipidemia   . Hypertension   . MI (myocardial infarction) ([place]) 1999  . Multiple myeloma ([place]) dx'd ~ 04/2016  . PAT (paroxysmal atrial tachycardia) ([place]) 12/22/2016  . Retinal hemorrhage, right eye   . Schatzki's ring   . Tachy-brady syndrome ([place])    Archie Endo 12/22/2016  . Tubular adenoma of colon       Past Surgical History:  Procedure Laterality Date  . APPENDECTOMY    . BACK SURGERY    . CATARACT EXTRACTION W/ INTRAOCULAR LENS IMPLANT Right 2014  . COLONOSCOPY WITH PROPOFOL N/A 09/04/2017   Procedure: COLONOSCOPY WITH PROPOFOL;  Surgeon: Doran Stabler, MD;  Location: [facility];  Service: Gastroenterology;  Laterality: N/A;  . CORONARY ANGIOGRAPHY N/A 01/23/2017   Procedure: Coronary Angiography;  Surgeon: [name], MD;  Location: [facility];  Service: Cardiovascular;  Laterality: N/A;  . CORONARY ANGIOPLASTY WITH STENT PLACEMENT   1999  . CORONARY STENT INTERVENTION N/A 12/29/2016   Procedure: Coronary Stent Intervention;  Surgeon: Charolette Forward, MD;  Location: [facility];  Service: Cardiovascular;  Laterality: N/A;  . CORONARY STENT INTERVENTION N/A 02/24/2017   Procedure: Coronary Stent Intervention;  Surgeon: Charolette Forward, MD;  Location: [facility];  Service: Cardiovascular;  Laterality: N/A;  . ESOPHAGOGASTRODUODENOSCOPY N/A 11/03/2015   Procedure: ESOPHAGOGASTRODUODENOSCOPY (EGD);  Surgeon: Carol Ada, MD;  Location: [facility] END[facility]COPY;  Service: Endoscopy;  Laterality: N/A;  . ESOPHAGOGASTRODUODENOSCOPY N/A 09/03/2017   Procedure: ESOPHAGOGASTRODUODENOSCOPY (EGD);  Surgeon: Doran Stabler, MD;  Location: [facility];  Service: Gastroenterology;  Laterality: N/A;  . ESOPHAGOGASTRODUODENOSCOPY (EGD) WITH PROPOFOL N/A 11/18/2016   Procedure: ESOPHAGOGASTRODUODENOSCOPY (EGD) WITH PROPOFOL;  Surgeon: Ladene Artist, MD;  Location: [facility];  Service: Endoscopy;  Laterality: N/A;  . LEFT HEART CATH AND CORONARY ANGIOGRAPHY N/A 12/24/2016   Procedure: Left Heart Cath and Coronary Angiography;  Surgeon: [name], MD;  Location: [facility];  Service: Cardiovascular;  Laterality: N/A;  . LEFT HEART CATH AND CORONARY ANGIOGRAPHY N/A 02/24/2017   Procedure: Left Heart Cath and Coronary Angiography;  Surgeon: Charolette Forward, MD;  Location: [facility];  Service: Cardiovascular;  Laterality: N/A;  .  LEFT HEART CATH AND CORONARY ANGIOGRAPHY N/A 03/05/2017   Procedure: Left Heart Cath and Coronary Angiography;  Surgeon: [name], MD;  Location: [facility];  Service: Cardiovascular;  Laterality: N/A;  . LUMBAR [place]    . RENAL ARTERY STENT  1999   bil RAS so ? bil vs unilateral stents  . RETINAL LASER PROCEDURE Right 2012   "bleeding"  . TONSILLECTOMY    . TOTAL ABDOMINAL HYSTERECTOMY      Family History  Problem Relation Age of Onset  . Breast cancer Mother   . Breast  cancer Sister   . Breast cancer Maternal Aunt   . Colon cancer Neg Hx    Social History:  reports that she quit smoking about 12 months ago. Her smoking use included cigarettes. She has a 25.00 pack-year smoking history. she has never used smokeless tobacco. She reports that she does not drink alcohol or use drugs.  Allergies:  Allergies  Allergen Reactions  . Sulfa Antibiotics Rash     (Not in a hospital admission)  Results for orders placed or performed during the hospital encounter of 12/14/17 (from the past 48 hour(s))  Basic metabolic panel     Status: Abnormal   Collection Time: 12/13/17 10:43 PM  Result Value Ref Range   Sodium 143 135 - 145 mmol/L   Potassium 4.0 3.5 - 5.1 mmol/L   Chloride 108 101 - 111 mmol/L   CO2 22 22 - 32 mmol/L   Glucose, Bld 102 (H) 65 - 99 mg/dL   BUN 29 (H) 6 - 20 mg/dL   Creatinine, Ser 1.56 (H) 0.44 - 1.00 mg/dL   Calcium 10.2 8.9 - 10.3 mg/dL   GFR calc non Af Amer 33 (L) >60 mL/min   GFR calc Af Amer 38 (L) >60 mL/min    Comment: (NOTE) The eGFR has been calculated using the CKD EPI equation. This calculation has not been validated in all clinical situations. eGFR's persistently <60 mL/min signify possible Chronic Kidney Disease.    Anion gap 13 5 - 15    Comment: Performed at [facility] [address] [place], [place] [address]  CBC     Status: Abnormal   Collection Time: 12/13/17 10:43 PM  Result Value Ref Range   WBC 7.0 4.0 - 10.5 K/uL   RBC 4.20 3.87 - 5.11 MIL/uL   Hemoglobin 11.9 (L) 12.0 - 15.0 g/dL   HCT 38.6 36.0 - 46.0 %   MCV 91.9 78.0 - 100.0 fL   MCH 28.3 26.0 - 34.0 pg   MCHC 30.8 30.0 - 36.0 g/dL   RDW 17.6 (H) 11.5 - 15.5 %   Platelets 242 150 - 400 K/uL    Comment: Performed at [facility] [address], [place], [place] [address]  I-stat troponin, ED     Status: None   Collection Time: 12/13/17 11:16 PM  Result Value Ref Range   Troponin i, poc 0.02 0.00 - 0.08 ng/mL   Comment 3             Comment: Due to the release kinetics of cTnI, a negative result within the first hours of the onset of symptoms does not rule out myocardial infarction with certainty. If myocardial infarction is still suspected, repeat the test at appropriate intervals.   Magnesium     Status: None   Collection Time: 12/14/17  4:43 AM  Result Value Ref Range   Magnesium 1.8 1.7 - 2.4  mg/dL    Comment: Performed at [facility], [place] [address], [place], [place] [address]   Dg Chest 2 View  Result Date: 12/13/2017 CLINICAL DATA:  Acute LEFT chest pain today.  Cough for 3 weeks. EXAM: CHEST  2 VIEW COMPARISON:  09/03/2017 and prior radiographs FINDINGS: Cardiomegaly noted. There is no evidence of focal airspace disease, pulmonary edema, suspicious pulmonary nodule/mass, pleural effusion, or pneumothorax. No acute bony abnormalities are identified. IMPRESSION: Cardiomegaly without evidence of acute cardiopulmonary disease. Electronically Signed   By: Margarette Canada M.D.   On: 12/13/2017 23:03    Review Of Systems Constitutional: No fever, chills, weight loss or gain. Eyes: No vision change, wears glasses. No discharge or pain. Ears: No hearing loss, No tinnitus. Respiratory: No asthma, COPD, pneumonias. Positive shortness of breath. No hemoptysis. Cardiovascular: Positive chest pain, palpitation, leg edema. Gastrointestinal: No nausea, vomiting, diarrhea, constipation. Positive GI bleed. No hepatitis. Genitourinary: No dysuria, hematuria, kidney stone. No incontinance. Neurological: Positive headache, no stroke, no seizures.  Psychiatry: No psych facility admission for anxiety, depression, suicide. No detox. Skin: No rash. Musculoskeletal: Positive joint pain, fibromyalgia. No neck pain, back pain. Lymphadenopathy: No lymphadenopathy. Hematology: No anemia or easy bruising.   Blood pressure (!) 165/85, pulse (!) 58, temperature 98.4 F (36.9 C), temperature source Oral, resp. rate 15, height 5'  5" (1.651 m), weight 79.4 kg (175 lb), SpO2 99 %. Body mass index is 29.12 kg/m. General appearance: alert, cooperative, appears stated age and no distress Head: Normocephalic, atraumatic. Eyes: Brown eyes, pink conjunctiva, corneas clear. PERRL, EOM's intact. Neck: No adenopathy, no carotid bruit, no JVD, supple, symmetrical, trachea midline and thyroid not enlarged. Resp: Clear to auscultation bilaterally. Cardio: Regular rate and rhythm, S1, S2 normal, II/VI systolic murmur, no click, rub or gallop GI: Soft, non-tender; bowel sounds normal; no organomegaly. Extremities: No edema, cyanosis or clubbing. Skin: Warm and dry.  Neurologic: Alert and oriented X 3, normal strength. Normal coordination..  Assessment/Plan Chest pain CAD LAD and LCX stents Multiple myeloma H/O DVT Sinus bradycardia  Admit. Hold amiodarone Check TSH. Hydralazine for blood pressure control. R/O MI  Birdie Riddle, MD  12/14/2017, 5:18 AM

## 2017-12-14 NOTE — ED Notes (Signed)
Cards at bedside

## 2017-12-14 NOTE — Discharge Planning (Signed)
Pt was recently active with [facility] for home health services.  If pt decides to go home with home health, [facility] can re-open services.

## 2017-12-14 NOTE — ED Notes (Signed)
Patient assisted to [facility] large formed bowel movement. Patient c/o pain in her left arm when b/p cuff pumps up.

## 2017-12-14 NOTE — ED Notes (Signed)
Room [id number]

## 2017-12-14 NOTE — Plan of Care (Signed)
Pt. Stated that she will use call light to call for assistance with toileting.

## 2017-12-14 NOTE — ED Notes (Signed)
Pt eating breakfast, reporting 5/10 left arm tingling pain, also chronic pain in her legs.

## 2017-12-14 NOTE — ED Notes (Signed)
Patient is resting in bed no complaints at present.

## 2017-12-15 ENCOUNTER — Inpatient Hospital Stay (HOSPITAL_COMMUNITY): Payer: Medicare HMO

## 2017-12-15 LAB — BASIC METABOLIC PANEL
Anion gap: 10 (ref 5–15)
BUN: 24 mg/dL — AB (ref 6–20)
CALCIUM: 9.3 mg/dL (ref 8.9–10.3)
CO2: 22 mmol/L (ref 22–32)
CREATININE: 1.22 mg/dL — AB (ref 0.44–1.00)
Chloride: 110 mmol/L (ref 101–111)
GFR calc Af Amer: 51 mL/min — ABNORMAL LOW (ref >60)
GFR calc non Af Amer: 44 mL/min — ABNORMAL LOW (ref >60)
GLUCOSE: 89 mg/dL (ref 65–99)
Potassium: 3.6 mmol/L (ref 3.5–5.1)
Sodium: 142 mmol/L (ref 135–145)

## 2017-12-15 LAB — CBC
HCT: 34.5 % — ABNORMAL LOW (ref 36.0–46.0)
Hemoglobin: 11.1 g/dL — ABNORMAL LOW (ref 12.0–15.0)
MCH: 29.2 pg (ref 26.0–34.0)
MCHC: 32.2 g/dL (ref 30.0–36.0)
MCV: 90.8 fL (ref 78.0–100.0)
PLATELETS: 219 10*3/uL (ref 150–400)
RBC: 3.8 MIL/uL — ABNORMAL LOW (ref 3.87–5.11)
RDW: 17.2 % — AB (ref 11.5–15.5)
WBC: 6.3 10*3/uL (ref 4.0–10.5)

## 2017-12-15 LAB — PROTIME-INR
INR: 1.09
Prothrombin Time: 14 seconds (ref 11.4–15.2)

## 2017-12-15 LAB — T3, FREE: T3, Free: 2.3 pg/mL (ref 2.0–4.4)

## 2017-12-15 LAB — HEPARIN LEVEL (UNFRACTIONATED)
HEPARIN UNFRACTIONATED: 0.7 [IU]/mL (ref 0.30–0.70)
Heparin Unfractionated: 0.48 IU/mL (ref 0.30–0.70)

## 2017-12-15 MED ORDER — ATROPINE SULFATE 1 MG/10ML IJ SOSY
PREFILLED_SYRINGE | INTRAMUSCULAR | Status: AC
  Filled 2017-12-15: qty 10

## 2017-12-15 MED ORDER — TECHNETIUM TC 99M TETROFOSMIN IV KIT
30.0000 | PACK | Freq: Once | INTRAVENOUS | Status: AC | PRN
  Administered 2017-12-15: 30 via INTRAVENOUS

## 2017-12-15 MED ORDER — REGADENOSON 0.4 MG/5ML IV SOLN
INTRAVENOUS | Status: AC
  Filled 2017-12-15: qty 5

## 2017-12-15 MED ORDER — TECHNETIUM TC 99M TETROFOSMIN IV KIT
10.0000 | PACK | Freq: Once | INTRAVENOUS | Status: AC | PRN
  Administered 2017-12-15: 10 via INTRAVENOUS

## 2017-12-15 MED ORDER — ATROPINE SULFATE 1 MG/10ML IJ SOSY: 1.0000 mg | PREFILLED_SYRINGE | Freq: Once | INTRAMUSCULAR | Status: DC

## 2017-12-15 MED ORDER — REGADENOSON 0.4 MG/5ML IV SOLN
0.4000 mg | Freq: Once | INTRAVENOUS | Status: AC
  Administered 2017-12-15: 0.4 mg via INTRAVENOUS
  Filled 2017-12-15: qty 5

## 2017-12-15 MED ORDER — ATROPINE SULFATE 1 MG/10ML IJ SOSY
0.5000 mg | PREFILLED_SYRINGE | Freq: Once | INTRAMUSCULAR | Status: AC
  Administered 2017-12-15: 0.5 mg via INTRAVENOUS

## 2017-12-15 NOTE — Progress Notes (Signed)
ANTICOAGULATION CONSULT NOTE - Follow Up Consult  Pharmacy Consult for Heparin Indication: chest pain/ACS  Allergies  Allergen Reactions  . Sulfa Antibiotics Rash    Patient Measurements: Height: 5\' 5"  (165.1 cm) Weight: 168 lb 4.8 oz (76.3 kg)(a scale) IBW/kg (Calculated) : 57 Heparin Dosing Weight: 73 kg  Vital Signs: Temp: 97.2 F (36.2 C) (02/26 0229) Temp Source: Oral (02/26 0229) BP: 150/79 (02/26 1103) Pulse Rate: 42 (02/26 0229)  Labs: Recent Labs    12/13/17 2243 12/14/17 0456 12/14/17 1049 12/14/17 1824 12/15/17 0405 12/15/17 1217  HGB 11.9*  --   --   --  11.1*  --   HCT 38.6  --   --   --  34.5*  --   PLT 242  --   --   --  219  --   LABPROT  --   --   --   --  14.0  --   INR  --   --   --   --  1.09  --   HEPARINUNFRC  --   --   --  1.10* 0.48 0.70  CREATININE 1.56*  --   --   --  1.22*  --   TROPONINI  --  0.03* 0.03* <0.03  --   --     Estimated Creatinine Clearance: 43.8 mL/min (A) (by C-G formula based on SCr of 1.22 mg/dL (H)).   Medications:  Scheduled:  . acyclovir  400 mg Oral BID  . aspirin EC  81 mg Oral Daily  . atorvastatin  80 mg Oral q1800  . ferrous sulfate  325 mg Oral Q breakfast  . hydrALAZINE  25 mg Oral QID  . pantoprazole  40 mg Oral Daily  . potassium chloride SA  20 mEq Oral Daily  . pyridostigmine  30 mg Oral BID  . ticagrelor  60 mg Oral BID  . vitamin C  1,000 mg Oral Daily   Infusions:  . heparin 800 Units/hr (12/15/17 1100)    Assessment: 72 yo F c/o CP radiating to UE/neck and associated w/ SOB, istat troponin negative.  Pt underwent stress test today, results pending.  Pt continues on heparin at 800 units/hr with hepar level at upper end of goal range.  No bleeding noted.  Goal of Therapy:  Heparin level 0.3-0.7 units/ml Monitor platelets by anticoagulation protocol: Yes   Plan:  Reduce heparin to 750 units/hr Daily heparin level and CBC  Amiel Mccaffrey, Pharm.D., BCPS Clinical Pharmacist Pager:  [phone] Clinical phone for 12/15/2017 from 8:30-4:00 is [phone]. After 4pm, please call Main [facility] [phone]) for assistance. 12/15/2017 1:56 PM

## 2017-12-15 NOTE — Progress Notes (Signed)
Ref: Nolene Ebbs, MD   Subjective:  Sinus bradycardia persist. Patient denies dizziness. Nuclear stress test today for Chest pain and CAD/Stent in LAD and LCX.  Objective:  Vital Signs in the last 24 hours: Temp:  [97.2 F (36.2 C)-98.6 F (37 C)] 97.2 F (36.2 C) (02/26 0229) Pulse Rate:  [42-94] 42 (02/26 0229) Cardiac Rhythm: Junctional rhythm (02/26 1025) Resp:  [18] 18 (02/25 2240) BP: (118-164)/(61-84) 150/79 (02/26 1103) SpO2:  [95 %-98 %] 97 % (02/26 0229) Weight:  [75.1 kg (165 lb 8 oz)-76.3 kg (168 lb 4.8 oz)] 76.3 kg (168 lb 4.8 oz) (02/26 0229)  Physical Exam: BP Readings from Last 1 Encounters:  12/15/17 (!) 150/79     Wt Readings from Last 1 Encounters:  12/15/17 76.3 kg (168 lb 4.8 oz)    Weight change: -4.309 kg (-8 oz) Body mass index is 28.01 kg/m. HEENT: [place]/AT, Eyes-Brown, PERL, EOMI, Conjunctiva-Pink, Sclera-Non-icteric Neck: No JVD, No bruit, Trachea midline. Lungs:  Clear, Bilateral. Cardiac:  Bradycardia, Regular rhythm, normal S1 and S2, no S3. II/VI systolic murmur. Abdomen:  Soft, non-tender. BS present. Extremities:  No edema present. No cyanosis. No clubbing. CNS: AxOx3, Cranial nerves grossly intact, moves all 4 extremities.  Skin: Warm and dry.   Intake/Output from previous day: 02/25 0701 - 02/26 0700 In: 389.8 [P.O.:240; I.V.:149.8] Out: 325 [Urine:325]    Lab Results: BMET    Component Value Date/Time   NA 142 12/15/2017 0405   NA 143 12/13/2017 2243   NA 145 12/10/2017 1445   NA 144 09/17/2017 1429   NA 141 07/30/2017 1504   NA 142 06/11/2017 1451   K 3.6 12/15/2017 0405   K 4.0 12/13/2017 2243   K 4.3 12/10/2017 1445   K 3.1 (L) 09/17/2017 1429   K 4.8 07/30/2017 1504   K 4.3 06/11/2017 1451   CL 110 12/15/2017 0405   CL 108 12/13/2017 2243   CL 115 (H) 12/10/2017 1445   CO2 22 12/15/2017 0405   CO2 22 12/13/2017 2243   CO2 20 (L) 12/10/2017 1445   CO2 23 09/17/2017 1429   CO2 20 (L) 07/30/2017 1504   CO2 20 (L)  06/11/2017 1451   GLUCOSE 89 12/15/2017 0405   GLUCOSE 102 (H) 12/13/2017 2243   GLUCOSE 96 12/10/2017 1445   GLUCOSE 96 09/17/2017 1429   GLUCOSE 79 07/30/2017 1504   GLUCOSE 74 06/11/2017 1451   BUN 24 (H) 12/15/2017 0405   BUN 29 (H) 12/13/2017 2243   BUN 24 12/10/2017 1445   BUN 15.6 09/17/2017 1429   BUN 24.4 07/30/2017 1504   BUN 18.7 06/11/2017 1451   CREATININE 1.22 (H) 12/15/2017 0405   CREATININE 1.56 (H) 12/13/2017 2243   CREATININE 1.31 (H) 12/10/2017 1445   CREATININE 1.14 (H) 11/12/2017 1442   CREATININE 1.53 (H) 10/29/2017 1338   CREATININE 1.0 09/17/2017 1429   CREATININE 1.2 (H) 07/30/2017 1504   CREATININE 1.2 (H) 06/11/2017 1451   CALCIUM 9.3 12/15/2017 0405   CALCIUM 10.2 12/13/2017 2243   CALCIUM 9.4 12/10/2017 1445   CALCIUM 8.6 09/17/2017 1429   CALCIUM 9.1 07/30/2017 1504   CALCIUM 9.6 06/11/2017 1451   GFRNONAA 44 (L) 12/15/2017 0405   GFRNONAA 33 (L) 12/13/2017 2243   GFRNONAA 40 (L) 12/10/2017 1445   GFRNONAA 48 (L) 11/12/2017 1442   GFRNONAA 33 (L) 10/29/2017 1338   GFRAA 51 (L) 12/15/2017 0405   GFRAA 38 (L) 12/13/2017 2243   GFRAA 47 (L) 12/10/2017 1445  GFRAA 55 (L) 11/12/2017 1442   GFRAA 39 (L) 10/29/2017 1338   CBC    Component Value Date/Time   WBC 6.3 12/15/2017 0405   RBC 3.80 (L) 12/15/2017 0405   HGB 11.1 (L) 12/15/2017 0405   HGB 11.3 (L) 09/17/2017 1428   HCT 34.5 (L) 12/15/2017 0405   HCT 35.8 09/17/2017 1428   PLT 219 12/15/2017 0405   PLT 215 12/10/2017 1446   PLT 231 09/17/2017 1428   MCV 90.8 12/15/2017 0405   MCV 93.2 09/17/2017 1428   MCH 29.2 12/15/2017 0405   MCHC 32.2 12/15/2017 0405   RDW 17.2 (H) 12/15/2017 0405   RDW 16.5 (H) 09/17/2017 1428   LYMPHSABS 1.3 12/10/2017 1446   LYMPHSABS 0.7 (L) 09/17/2017 1428   MONOABS 0.6 12/10/2017 1446   MONOABS 0.5 09/17/2017 1428   EOSABS 0.1 12/10/2017 1446   EOSABS 0.1 09/17/2017 1428   BASOSABS 0.0 12/10/2017 1446   BASOSABS 0.0 09/17/2017 1428   HEPATIC  Function Panel Recent Labs    09/17/17 1429 10/29/17 1338 12/10/17 1445  PROT 6.6 7.3 6.8   HEMOGLOBIN A1C No components found for: HGA1C,  MPG CARDIAC ENZYMES Lab Results  Component Value Date   CKTOTAL 52 08/21/2017   CKMB 1.3 08/21/2017   TROPONINI <0.03 12/14/2017   TROPONINI 0.03 (HH) 12/14/2017   TROPONINI 0.03 (HH) 12/14/2017   BNP No results for input(s): PROBNP in the last 8760 hours. TSH Recent Labs    12/22/16 2008 06/24/17 1046 12/13/17 2243  TSH 0.481 0.844 1.922   CHOLESTEROL Recent Labs    03/05/17 0304 06/24/17 0545 12/14/17 0457  CHOL 174 191 167    Scheduled Meds: . acyclovir  400 mg Oral BID  . aspirin EC  81 mg Oral Daily  . atorvastatin  80 mg Oral q1800  . ferrous sulfate  325 mg Oral Q breakfast  . hydrALAZINE  25 mg Oral QID  . pantoprazole  40 mg Oral Daily  . potassium chloride SA  20 mEq Oral Daily  . pyridostigmine  30 mg Oral BID  . ticagrelor  60 mg Oral BID  . vitamin C  1,000 mg Oral Daily   Continuous Infusions: . heparin 750 Units/hr (12/15/17 1407)   PRN Meds:.acetaminophen, nitroGLYCERIN, ondansetron (ZOFRAN) IV  Assessment/Plan:   Chest pain CAD Stents in LAD and LCX Multiple myeloma H/O dVt H/O GI bleed Sinus bradycardia, asymptomatic  Nuclear stress test today.   LOS: 1 day    [name]  MD  12/15/2017, 5:01 PM

## 2017-12-15 NOTE — Progress Notes (Signed)
ANTICOAGULATION CONSULT NOTE - Follow Up Consult  Pharmacy Consult for heparin Indication: chest pain/ACS  Labs: Recent Labs    12/13/17 2243 12/14/17 0456 12/14/17 1049 12/14/17 1824 12/15/17 0405  HGB 11.9*  --   --   --  11.1*  HCT 38.6  --   --   --  34.5*  PLT 242  --   --   --  219  LABPROT  --   --   --   --  14.0  INR  --   --   --   --  1.09  HEPARINUNFRC  --   --   --  1.10* 0.48  CREATININE 1.56*  --   --   --   --   TROPONINI  --  0.03* 0.03* <0.03  --     Assessment/Plan:  72yo female therapeutic on heparin after rate change. Will continue gtt at current rate and confirm stable with additional level.   Wynona Neat, PharmD, BCPS  12/15/2017,5:24 AM

## 2017-12-15 NOTE — Plan of Care (Signed)
Pt. Ambulates able to ambulate to restroom with assistance without incident. Pt. Uses call light for assistance when needed.

## 2017-12-16 LAB — CBC
HEMATOCRIT: 31.4 % — AB (ref 36.0–46.0)
HEMOGLOBIN: 9.8 g/dL — AB (ref 12.0–15.0)
MCH: 28.8 pg (ref 26.0–34.0)
MCHC: 31.2 g/dL (ref 30.0–36.0)
MCV: 92.4 fL (ref 78.0–100.0)
Platelets: 189 10*3/uL (ref 150–400)
RBC: 3.4 MIL/uL — AB (ref 3.87–5.11)
RDW: 17.5 % — ABNORMAL HIGH (ref 11.5–15.5)
WBC: 4.6 10*3/uL (ref 4.0–10.5)

## 2017-12-16 LAB — HEPARIN LEVEL (UNFRACTIONATED): HEPARIN UNFRACTIONATED: 0.65 [IU]/mL (ref 0.30–0.70)

## 2017-12-16 NOTE — Progress Notes (Signed)
Ref: Nolene Ebbs, MD   Subjective:  Sinus bradycardia persist. Nuclear stress test showed large scar and no reversible ischemia.   Objective:  Vital Signs in the last 24 hours: Temp:  [98.2 F (36.8 C)-98.9 F (37.2 C)] 98.7 F (37.1 C) (02/27 1956) Pulse Rate:  [50-57] 52 (02/27 1956) Cardiac Rhythm: Sinus bradycardia (02/27 1900) Resp:  [18] 18 (02/27 1956) BP: (127-148)/(48-80) 145/64 (02/27 1956) SpO2:  [99 %-100 %] 100 % (02/27 1956) Weight:  [76.2 kg (167 lb 14.4 oz)] 76.2 kg (167 lb 14.4 oz) (02/27 0600)  Physical Exam: BP Readings from Last 1 Encounters:  12/16/17 (!) 145/64     Wt Readings from Last 1 Encounters:  12/16/17 76.2 kg (167 lb 14.4 oz)    Weight change: 1.089 kg (2 lb 6.4 oz) Body mass index is 27.94 kg/m. HEENT: [place]/AT, Eyes-Brown, PERL, EOMI, Conjunctiva-Pink, Sclera-Non-icteric Neck: No JVD, No bruit, Trachea midline. Lungs:  Clear, Bilateral. Cardiac:  Bradycardia, normal S1 and S2, no S3. II/VI systolic murmur. Abdomen:  Soft, non-tender. BS present. Extremities:  No edema present. No cyanosis. No clubbing. CNS: AxOx3, Cranial nerves grossly intact, moves all 4 extremities.  Skin: Warm and dry.   Intake/Output from previous day: 02/26 0701 - 02/27 0700 In: 358.1 [P.O.:240; I.V.:118.1] Out: 720 [Urine:720]    Lab Results: BMET    Component Value Date/Time   NA 142 12/15/2017 0405   NA 143 12/13/2017 2243   NA 145 12/10/2017 1445   NA 144 09/17/2017 1429   NA 141 07/30/2017 1504   NA 142 06/11/2017 1451   K 3.6 12/15/2017 0405   K 4.0 12/13/2017 2243   K 4.3 12/10/2017 1445   K 3.1 (L) 09/17/2017 1429   K 4.8 07/30/2017 1504   K 4.3 06/11/2017 1451   CL 110 12/15/2017 0405   CL 108 12/13/2017 2243   CL 115 (H) 12/10/2017 1445   CO2 22 12/15/2017 0405   CO2 22 12/13/2017 2243   CO2 20 (L) 12/10/2017 1445   CO2 23 09/17/2017 1429   CO2 20 (L) 07/30/2017 1504   CO2 20 (L) 06/11/2017 1451   GLUCOSE 89 12/15/2017 0405   GLUCOSE 102 (H) 12/13/2017 2243   GLUCOSE 96 12/10/2017 1445   GLUCOSE 96 09/17/2017 1429   GLUCOSE 79 07/30/2017 1504   GLUCOSE 74 06/11/2017 1451   BUN 24 (H) 12/15/2017 0405   BUN 29 (H) 12/13/2017 2243   BUN 24 12/10/2017 1445   BUN 15.6 09/17/2017 1429   BUN 24.4 07/30/2017 1504   BUN 18.7 06/11/2017 1451   CREATININE 1.22 (H) 12/15/2017 0405   CREATININE 1.56 (H) 12/13/2017 2243   CREATININE 1.31 (H) 12/10/2017 1445   CREATININE 1.14 (H) 11/12/2017 1442   CREATININE 1.53 (H) 10/29/2017 1338   CREATININE 1.0 09/17/2017 1429   CREATININE 1.2 (H) 07/30/2017 1504   CREATININE 1.2 (H) 06/11/2017 1451   CALCIUM 9.3 12/15/2017 0405   CALCIUM 10.2 12/13/2017 2243   CALCIUM 9.4 12/10/2017 1445   CALCIUM 8.6 09/17/2017 1429   CALCIUM 9.1 07/30/2017 1504   CALCIUM 9.6 06/11/2017 1451   GFRNONAA 44 (L) 12/15/2017 0405   GFRNONAA 33 (L) 12/13/2017 2243   GFRNONAA 40 (L) 12/10/2017 1445   GFRNONAA 48 (L) 11/12/2017 1442   GFRNONAA 33 (L) 10/29/2017 1338   GFRAA 51 (L) 12/15/2017 0405   GFRAA 38 (L) 12/13/2017 2243   GFRAA 47 (L) 12/10/2017 1445   GFRAA 55 (L) 11/12/2017 1442   GFRAA 39 (L) 10/29/2017  1338   CBC    Component Value Date/Time   WBC 4.6 12/16/2017 0422   RBC 3.40 (L) 12/16/2017 0422   HGB 9.8 (L) 12/16/2017 0422   HGB 11.3 (L) 09/17/2017 1428   HCT 31.4 (L) 12/16/2017 0422   HCT 35.8 09/17/2017 1428   PLT 189 12/16/2017 0422   PLT 215 12/10/2017 1446   PLT 231 09/17/2017 1428   MCV 92.4 12/16/2017 0422   MCV 93.2 09/17/2017 1428   MCH 28.8 12/16/2017 0422   MCHC 31.2 12/16/2017 0422   RDW 17.5 (H) 12/16/2017 0422   RDW 16.5 (H) 09/17/2017 1428   LYMPHSABS 1.3 12/10/2017 1446   LYMPHSABS 0.7 (L) 09/17/2017 1428   MONOABS 0.6 12/10/2017 1446   MONOABS 0.5 09/17/2017 1428   EOSABS 0.1 12/10/2017 1446   EOSABS 0.1 09/17/2017 1428   BASOSABS 0.0 12/10/2017 1446   BASOSABS 0.0 09/17/2017 1428   HEPATIC Function Panel Recent Labs    09/17/17 1429  10/29/17 1338 12/10/17 1445  PROT 6.6 7.3 6.8   HEMOGLOBIN A1C No components found for: HGA1C,  MPG CARDIAC ENZYMES Lab Results  Component Value Date   CKTOTAL 52 08/21/2017   CKMB 1.3 08/21/2017   TROPONINI <0.03 12/14/2017   TROPONINI 0.03 (HH) 12/14/2017   TROPONINI 0.03 (HH) 12/14/2017   BNP No results for input(s): PROBNP in the last 8760 hours. TSH Recent Labs    12/22/16 2008 06/24/17 1046 12/13/17 2243  TSH 0.481 0.844 1.922   CHOLESTEROL Recent Labs    03/05/17 0304 06/24/17 0545 12/14/17 0457  CHOL 174 191 167    Scheduled Meds: . acyclovir  400 mg Oral BID  . aspirin EC  81 mg Oral Daily  . atorvastatin  80 mg Oral q1800  . ferrous sulfate  325 mg Oral Q breakfast  . hydrALAZINE  25 mg Oral QID  . pantoprazole  40 mg Oral Daily  . potassium chloride SA  20 mEq Oral Daily  . pyridostigmine  30 mg Oral BID  . ticagrelor  60 mg Oral BID  . vitamin C  1,000 mg Oral Daily   Continuous Infusions: PRN Meds:.acetaminophen, nitroGLYCERIN, ondansetron (ZOFRAN) IV  Assessment/Plan: Chest pain CAD LAD and LCX stents Multiple myeloma H/O DVT H/O GI bleed Sinus bradycardia  Increase activity.   LOS: 2 days    [name]  MD  12/16/2017, 10:14 PM

## 2017-12-16 NOTE — Progress Notes (Addendum)
ANTICOAGULATION CONSULT NOTE - Follow Up Consult  Pharmacy Consult for Heparin Indication: chest pain/ACS  Allergies  Allergen Reactions  . Sulfa Antibiotics Rash    Patient Measurements: Height: 5\' 5"  (165.1 cm) Weight: 167 lb 14.4 oz (76.2 kg) IBW/kg (Calculated) : 57 Heparin Dosing Weight: 73 kg  Vital Signs: Temp: 98.2 F (36.8 C) (02/27 0600) Temp Source: Oral (02/27 0600) BP: 148/80 (02/27 0600) Pulse Rate: 56 (02/27 0600)  Labs: Recent Labs    12/13/17 2243 12/14/17 0456 12/14/17 1049  12/14/17 1824 12/15/17 0405 12/15/17 1217 12/16/17 0422  HGB 11.9*  --   --   --   --  11.1*  --  9.8*  HCT 38.6  --   --   --   --  34.5*  --  31.4*  PLT 242  --   --   --   --  219  --  189  LABPROT  --   --   --   --   --  14.0  --   --   INR  --   --   --   --   --  1.09  --   --   HEPARINUNFRC  --   --   --    < > 1.10* 0.48 0.70 0.65  CREATININE 1.56*  --   --   --   --  1.22*  --   --   TROPONINI  --  0.03* 0.03*  --  <0.03  --   --   --    < > = values in this interval not displayed.    Estimated Creatinine Clearance: 43.8 mL/min (A) (by C-G formula based on SCr of 1.22 mg/dL (H)).   Medications:  Scheduled:  . acyclovir  400 mg Oral BID  . aspirin EC  81 mg Oral Daily  . atorvastatin  80 mg Oral q1800  . ferrous sulfate  325 mg Oral Q breakfast  . hydrALAZINE  25 mg Oral QID  . pantoprazole  40 mg Oral Daily  . potassium chloride SA  20 mEq Oral Daily  . pyridostigmine  30 mg Oral BID  . ticagrelor  60 mg Oral BID  . vitamin C  1,000 mg Oral Daily   Infusions:  . heparin 750 Units/hr (12/15/17 1800)    Assessment: 72 yo F c/o CP radiating to UE/neck and associated w/ SOB, istat troponin negative.  Pt underwent stress test today, results pending.  Pt  on heparin at 750 units/hr and noted at goal. Stress test completed 2/26- no areas of ischemia  Goal of Therapy:  Heparin level 0.3-0.7 units/ml Monitor platelets by anticoagulation protocol: Yes    Plan:  Hildred Laser, PharmD Clinical Pharmacist Clinical phone from 8:30-4:00 is [phone] After 4pm, please call [facility] [phone]) for assistance. 12/16/2017 8:10 AM    Hildred Laser, Pharm D 12/16/2017 8:03 AM

## 2017-12-16 NOTE — Plan of Care (Signed)
  Education: Knowledge of General Education information will improve 12/16/2017 0849 - Progressing by Imagene Gurney, RN

## 2017-12-17 MED ORDER — HYDRALAZINE HCL 25 MG PO TABS: 25.0000 mg | ORAL_TABLET | Freq: Two times a day (BID) | ORAL | 6 refills | Status: DC

## 2017-12-17 NOTE — Plan of Care (Signed)
  Education: Knowledge of General Education information will improve 12/17/2017 0024 - Progressing by Theador Hawthorne, RN   Clinical Measurements: Diagnostic test results will improve 12/17/2017 0024 - Progressing by Theador Hawthorne, RN   Clinical Measurements: Respiratory complications will improve 12/17/2017 0024 - Progressing by Theador Hawthorne, RN   Pain Managment: General experience of comfort will improve 12/17/2017 0024 - Progressing by Theador Hawthorne, RN   Safety: Ability to remain free from injury will improve 12/17/2017 0024 - Progressing by Theador Hawthorne, RN

## 2017-12-17 NOTE — Progress Notes (Signed)
CM talked to patient about [facility], patient is refusing [place] at this time. CM will continue to follow for progression of care. Mindi Slicker [facility] [phone]

## 2017-12-17 NOTE — Discharge Summary (Addendum)
Physician Discharge Summary  Patient ID: Abigail [name] MRN: [id number] DOB/AGE: [birth year] 72 y.o.  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  She has past medical history of multiple myeloma, LAD and LCx stents, with left leg DVT and recurrent GI bleed.  She was on amiodarone for nonsustained VT post myocardial infarction.  Her amiodarone was discontinued and her sinus bradycardia rate improved from 30s to high 40s-50s.  Her blood pressure was controlled with the use of hydralazine.  She underwent nuclear stress test that showed a large fixed defect without reversible ischemia.  Her activity was increased and she was discharged home in stable condition with follow-up by me in 1 month and by primary care physician in 1 week.  Consults: cardiology  Significant Diagnostic Studies: labs: CBC was essentially unremarkable with a mild anemia.  We met was unremarkable except slightly high BUN and creatinine.  BNP was elevated.  Troponin was borderline high.  Lipid panel was normal with LDL cholesterol of 57 mg.  TSH was normal.  EKG: SB, Old ASWMI, LVH.  Chest X-ray: cardiomegaly.   Nuclear medicine: NM myocardial perfusion test was negative for reversible ischemia.  Treatments: cardiac meds: Aspirin, Brilinta, Atorvastatin, SL NTG, furosemide and potassium.  Discharge Exam: Blood pressure (!) 124/48, pulse (!) 50, temperature 98.6 F (37 C), temperature source Oral, resp. rate 18, height _0  (1.651 m),  weight 76.2 kg (167 lb 14.4 oz), SpO2 99 %. General appearance: alert, cooperative and appears stated age. Head: Normocephalic, atraumatic. Eyes: Brown eyes, pale conjunctiva, corneas clear. PERRL, EOM's intact.  Neck: No adenopathy, no carotid bruit, no JVD, supple, symmetrical, trachea midline and thyroid not enlarged. Resp: Clear to auscultation bilaterally. Cardio: Regular rate and rhythm, S1, S2 normal, II/VI systolic murmur, no click, rub or gallop. GI: Soft, non-tender; bowel sounds normal; no organomegaly. Extremities: No edema, cyanosis or clubbing. Skin: Warm and dry.  Neurologic: Alert and oriented X 3, normal strength and tone. Normal coordination and gait.  Disposition: 01-Home-Self care.   Allergies as of 12/17/2017      Reactions   Sulfa Antibiotics Rash      Medication List    STOP taking these medications   amiodarone 200 MG tablet Commonly known as:  PACERONE     TAKE these medications   acyclovir 400 MG tablet Commonly known as:  ZOVIRAX TAKE 1 TABLET (400 MG TOTAL) BY MOUTH 2 (TWO) TIMES DAILY.   aspirin EC 81 MG tablet Take 1 tablet (81 mg total) by mouth daily.   atorvastatin 80 MG tablet Commonly known as:  LIPITOR Take 1 tablet (80 mg total) by mouth daily at 6 PM.   BRILINTA 60 MG Tabs tablet Generic drug:  ticagrelor Take 60 mg 2 (two) times daily by mouth.   calcium carbonate 1250 (500 Ca) MG tablet Commonly known as:  OS-CAL - dosed in mg of elemental calcium Take 1 tablet by mouth daily with breakfast.   ferrous sulfate 325 (65 FE) MG tablet Take 1 tablet (325 mg total) by mouth daily with breakfast.   furosemide 40  MG tablet Commonly known as:  LASIX TAKE 1 TABLET (40 MG TOTAL) BY MOUTH DAILY AS NEEDED FOR FLUID   hydrALAZINE 25 MG tablet Commonly known as:  APRESOLINE Take 1 tablet (25 mg total) by mouth 2 (two) times daily.   ibuprofen 200 MG tablet Commonly known as:  ADVIL,MOTRIN Take 200 mg by mouth every 6 (six) hours as  needed.   nitroGLYCERIN 0.4 MG SL tablet Commonly known as:  NITROSTAT Place 0.4 mg under the tongue every 5 (five) minutes as needed for chest pain.   pantoprazole 40 MG tablet Commonly known as:  PROTONIX Take 1 tablet (40 mg total) by mouth daily.   potassium chloride SA 20 MEQ tablet Commonly known as:  K-DUR,KLOR-CON Take 1 tablet (20 mEq total) by mouth daily.   pyridostigmine 60 MG tablet Commonly known as:  MESTINON Take 0.5 tablets (30 mg total) by mouth 2 (two) times daily.   vitamin C 1000 MG tablet Take 1,000 mg by mouth daily.   VITAMIN D PO Take 1 capsule by mouth daily.      Follow-up Information    Nolene Ebbs, MD. Schedule an appointment as soon as possible for a visit in 1 week(s).   Specialty:  Internal Medicine Contact information: [place] [address] [phone]        [name], MD. Schedule an appointment as soon as possible for a visit in 2 week(s).   Specialty:  Cardiology Contact information: [place] [address] [phone]           Signed: Birdie Riddle 12/17/2017, 9:29 AM

## 2018-01-21 ENCOUNTER — Inpatient Hospital Stay: Payer: Medicare HMO | Attending: Nurse Practitioner

## 2018-01-21 ENCOUNTER — Inpatient Hospital Stay (HOSPITAL_BASED_OUTPATIENT_CLINIC_OR_DEPARTMENT_OTHER): Payer: Medicare HMO | Admitting: Nurse Practitioner

## 2018-01-21 ENCOUNTER — Telehealth: Payer: Self-pay | Admitting: Nurse Practitioner

## 2018-01-21 ENCOUNTER — Encounter: Payer: Self-pay | Admitting: Nurse Practitioner

## 2018-01-21 ENCOUNTER — Inpatient Hospital Stay: Payer: Medicare HMO

## 2018-01-21 VITALS — BP 171/69 | HR 62 | Temp 98.6°F | Resp 18 | Ht 65.0 in | Wt 178.9 lb

## 2018-01-21 DIAGNOSIS — I2510 Atherosclerotic heart disease of native coronary artery without angina pectoris: Secondary | ICD-10-CM | POA: Insufficient documentation

## 2018-01-21 DIAGNOSIS — Z86718 Personal history of other venous thrombosis and embolism: Secondary | ICD-10-CM

## 2018-01-21 DIAGNOSIS — D630 Anemia in neoplastic disease: Secondary | ICD-10-CM

## 2018-01-21 DIAGNOSIS — C9000 Multiple myeloma not having achieved remission: Secondary | ICD-10-CM

## 2018-01-21 DIAGNOSIS — D649 Anemia, unspecified: Secondary | ICD-10-CM | POA: Insufficient documentation

## 2018-01-21 DIAGNOSIS — Z7901 Long term (current) use of anticoagulants: Secondary | ICD-10-CM | POA: Diagnosis not present

## 2018-01-21 DIAGNOSIS — C9001 Multiple myeloma in remission: Secondary | ICD-10-CM | POA: Diagnosis not present

## 2018-01-21 LAB — CBC WITH DIFFERENTIAL (CANCER CENTER ONLY)
Basophils Absolute: 0 10*3/uL (ref 0.0–0.1)
Basophils Relative: 1 %
EOS ABS: 0.2 10*3/uL (ref 0.0–0.5)
Eosinophils Relative: 4 %
HCT: 34.2 % — ABNORMAL LOW (ref 34.8–46.6)
HEMOGLOBIN: 10.9 g/dL — AB (ref 11.6–15.9)
Lymphocytes Relative: 31 %
Lymphs Abs: 1.4 10*3/uL (ref 0.9–3.3)
MCH: 28.6 pg (ref 25.1–34.0)
MCHC: 32 g/dL (ref 31.5–36.0)
MCV: 89.4 fL (ref 79.5–101.0)
Monocytes Absolute: 0.6 10*3/uL (ref 0.1–0.9)
Monocytes Relative: 13 %
NEUTROS PCT: 51 %
Neutro Abs: 2.3 10*3/uL (ref 1.5–6.5)
PLATELETS: 177 10*3/uL (ref 145–400)
RBC: 3.82 MIL/uL (ref 3.70–5.45)
RDW: 18.2 % — ABNORMAL HIGH (ref 11.2–14.5)
WBC: 4.5 10*3/uL (ref 3.9–10.3)

## 2018-01-21 LAB — CMP (CANCER CENTER ONLY)
ALK PHOS: 106 U/L (ref 40–150)
ALT: 12 U/L (ref 0–55)
AST: 16 U/L (ref 5–34)
Albumin: 3.7 g/dL (ref 3.5–5.0)
Anion gap: 9 (ref 3–11)
BILIRUBIN TOTAL: 0.5 mg/dL (ref 0.2–1.2)
BUN: 20 mg/dL (ref 7–26)
CO2: 25 mmol/L (ref 22–29)
CREATININE: 1.22 mg/dL — AB (ref 0.60–1.10)
Calcium: 9.5 mg/dL (ref 8.4–10.4)
Chloride: 111 mmol/L — ABNORMAL HIGH (ref 98–109)
GFR, EST AFRICAN AMERICAN: 50 mL/min — AB (ref >60)
GFR, EST NON AFRICAN AMERICAN: 43 mL/min — AB (ref >60)
Glucose, Bld: 81 mg/dL (ref 70–140)
Potassium: 3.7 mmol/L (ref 3.5–5.1)
Sodium: 145 mmol/L (ref 136–145)
Total Protein: 6.7 g/dL (ref 6.4–8.3)

## 2018-01-21 MED ORDER — ZOLEDRONIC ACID 4 MG/5ML IV CONC
3.5000 mg | Freq: Once | INTRAVENOUS | Status: AC
  Administered 2018-01-21: 3.5 mg via INTRAVENOUS
  Filled 2018-01-21: qty 4.38

## 2018-01-21 MED ORDER — SODIUM CHLORIDE 0.9 % IV SOLN
Freq: Once | INTRAVENOUS | Status: AC
  Administered 2018-01-21: 17:00:00 via INTRAVENOUS

## 2018-01-21 NOTE — Progress Notes (Signed)
[facility] OFFICE PROGRESS NOTE   Diagnosis: Multiple myeloma  INTERVAL HISTORY:   Abigail [name] returns as scheduled.  She was hospitalized 12/14/2017 presenting with chest pain and shortness of breath. Medications were adjusted.  She was discharged home 12/14/2017.  She feels well.  She denies shortness of breath.  No further episodes of chest pain.  She has a good appetite.  She reports she is gaining weight.  No bowel or bladder problems.  Objective:  Vital signs in last 24 hours:  Blood pressure (!) 171/69, pulse 62, temperature 98.6 F (37 C), temperature source Oral, resp. rate 18, height _0  (1.651 m), weight 178 lb 14.4 oz (81.1 kg), SpO2 100 %.    HEENT: No thrush or ulcers. Resp: Lungs clear bilaterally. Cardio: Regular rate and rhythm. GI: Abdomen soft and nontender.  No hepatospleno megaly. Vascular: No leg edema.  Calves soft and nontender.    Lab Results:  Lab Results  Component Value Date   WBC 4.5 01/21/2018   HGB 9.8 (L) 12/16/2017   HCT 34.2 (L) 01/21/2018   MCV 89.4 01/21/2018   PLT 177 01/21/2018   NEUTROABS 2.3 01/21/2018    Imaging:  No results found.  Medications: I have reviewed the patient's current medications.  Assessment/Plan: 1. Multiple myeloma, IgG lambda, bone marrow biopsy 06/15/2016 confirmed multiple myeloma; cytogenetics by FISHshow +11, +12, 13q-  Elevated serum free lambda light chains  Lambda light chain proteinuria  Lytic bone lesions on a bone survey 06/13/2016  Initiation of weekly Velcade/Decadron 06/19/2016  Serum light chains improved 07/31/2016  Initiation of Revlimid 11/03/20172 weeks on/2 weeks off  Serum M spike and IgG significantly improved 09/25/2016  Velcade held on 10/30/2016 due to urinary retention  Treatment resumed with Revlimid (2 weeks on/2 weeks off) and weekly Decadron on 02/12/2017, treatment placed on hold May 2018 secondary to admissions with cardiac disease  2. Severe  anemia secondary to #1-markedly improved  3. Diffuse lytic bone lesions secondary to multiple myeloma, status post Zometa 09/18/2016(plan to continue every 3 months);most recent Zometa infusion 10/08/2017  4. History of coronary artery disease/myocardial infarction  5. History of colon polyps  6. Bilateral leg edema 09/04/2016. Negative bilateral venous Doppler 09/05/2016.  7. Mild periorbital edema 09/18/2016, question related to early stye formation, question Velcade related chalazia.Improved.  8. CT scan 10/27/2016 with a new right kidney massevaluated by urology; MRI abdomen 11/18/2016 multiple bilateral renal cysts. Most of these are simple cysts. The 2 lesions in question on the CT scan are both hemorrhagic or proteinaceous cysts. No worrisome contrast enhancement.  9. Urinary retention 10/28/2016 status post evaluation in the emergency department. Urinalysis negative for signs of infection 10/30/2016. Velcade held. Resolved.  10. Abdominal pain, nausea, vomiting.Etiology unclear. Upper endoscopy 11/18/2016 with findings of reflux esophagitis, benign appearing esophageal stenosis, small hiatal hernia and erythematous duodenopathy. CT abdomen/pelvis 11/19/2016 with no acute abnormality.  11. 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 Left lower extremity DVT May 2018.Maintainedon Coumadin.Coumadin discontinued October 2018 secondary to a supratherapeutic PT/INR and GI bleeding  13.Admission with GI bleeding October and November 2018-felt secondary to diverticular bleeding, initially while on supratherapeutic Coumadin, colonoscopy 09/04/2017-no source of blood loss identified, superficial tear at the posterior anal mucosa without stigmata of bleeding      Disposition: Abigail [name] appears stable.  There is no clinical or laboratory evidence of disease progression.  She will remain off of specific therapy for myeloma.  Plan to continue  Zometa every 3 months, dose due today.  She will return for lab and follow-up in 6 weeks.  She will contact the office in the interim with any problems.    [name] ANP/GNP-BC   01/21/2018  4:05 PM

## 2018-01-21 NOTE — Patient Instructions (Signed)

## 2018-01-21 NOTE — Telephone Encounter (Signed)
Scheduled appt per 4/4 los - sent reminder letter in the mail with appt date and time.

## 2018-01-22 LAB — KAPPA/LAMBDA LIGHT CHAINS
KAPPA, LAMDA LIGHT CHAIN RATIO: 0.43 (ref 0.26–1.65)
Kappa free light chain: 8.9 mg/L (ref 3.3–19.4)
Lambda free light chains: 20.6 mg/L (ref 5.7–26.3)

## 2018-01-22 LAB — BETA 2 MICROGLOBULIN, SERUM: BETA 2 MICROGLOBULIN: 2.6 mg/L — AB (ref 0.6–2.4)

## 2018-01-25 LAB — PROTEIN ELECTROPHORESIS, SERUM
A/G RATIO SPE: 1.4 (ref 0.7–1.7)
ALBUMIN ELP: 3.9 g/dL (ref 2.9–4.4)
ALPHA-1-GLOBULIN: 0.2 g/dL (ref 0.0–0.4)
ALPHA-2-GLOBULIN: 0.7 g/dL (ref 0.4–1.0)
Beta Globulin: 0.9 g/dL (ref 0.7–1.3)
Gamma Globulin: 0.8 g/dL (ref 0.4–1.8)
Globulin, Total: 2.7 g/dL (ref 2.2–3.9)
M-SPIKE, %: 0.5 g/dL — AB
TOTAL PROTEIN ELP: 6.6 g/dL (ref 6.0–8.5)

## 2018-03-04 ENCOUNTER — Ambulatory Visit: Payer: Medicare HMO | Admitting: Oncology

## 2018-03-04 ENCOUNTER — Other Ambulatory Visit: Payer: Medicare HMO

## 2018-03-05 ENCOUNTER — Telehealth: Payer: Self-pay

## 2018-03-05 ENCOUNTER — Inpatient Hospital Stay: Payer: Medicare HMO | Attending: Nurse Practitioner

## 2018-03-05 ENCOUNTER — Inpatient Hospital Stay (HOSPITAL_BASED_OUTPATIENT_CLINIC_OR_DEPARTMENT_OTHER): Payer: Medicare HMO | Admitting: Oncology

## 2018-03-05 VITALS — BP 180/79 | HR 56 | Temp 98.9°F | Resp 18 | Ht 63.0 in | Wt 180.5 lb

## 2018-03-05 DIAGNOSIS — I2510 Atherosclerotic heart disease of native coronary artery without angina pectoris: Secondary | ICD-10-CM | POA: Insufficient documentation

## 2018-03-05 DIAGNOSIS — D630 Anemia in neoplastic disease: Secondary | ICD-10-CM

## 2018-03-05 DIAGNOSIS — Z86718 Personal history of other venous thrombosis and embolism: Secondary | ICD-10-CM

## 2018-03-05 DIAGNOSIS — C9001 Multiple myeloma in remission: Secondary | ICD-10-CM | POA: Diagnosis not present

## 2018-03-05 DIAGNOSIS — Z7901 Long term (current) use of anticoagulants: Secondary | ICD-10-CM | POA: Diagnosis not present

## 2018-03-05 LAB — CBC WITH DIFFERENTIAL (CANCER CENTER ONLY)
Basophils Absolute: 0 10*3/uL (ref 0.0–0.1)
Basophils Relative: 1 %
EOS ABS: 0.1 10*3/uL (ref 0.0–0.5)
Eosinophils Relative: 2 %
HEMATOCRIT: 37.1 % (ref 34.8–46.6)
HEMOGLOBIN: 11.6 g/dL (ref 11.6–15.9)
LYMPHS PCT: 22 %
Lymphs Abs: 1.3 10*3/uL (ref 0.9–3.3)
MCH: 28.6 pg (ref 25.1–34.0)
MCHC: 31.3 g/dL — ABNORMAL LOW (ref 31.5–36.0)
MCV: 91.4 fL (ref 79.5–101.0)
Monocytes Absolute: 0.6 10*3/uL (ref 0.1–0.9)
Monocytes Relative: 10 %
NEUTROS ABS: 3.9 10*3/uL (ref 1.5–6.5)
NEUTROS PCT: 65 %
Platelet Count: 231 10*3/uL (ref 145–400)
RBC: 4.06 MIL/uL (ref 3.70–5.45)
RDW: 17.3 % — ABNORMAL HIGH (ref 11.2–14.5)
WBC: 5.9 10*3/uL (ref 3.9–10.3)

## 2018-03-05 LAB — CMP (CANCER CENTER ONLY)
ALBUMIN: 4.3 g/dL (ref 3.5–5.0)
ALK PHOS: 124 U/L (ref 40–150)
ALT: 11 U/L (ref 0–55)
ANION GAP: 12 — AB (ref 3–11)
AST: 16 U/L (ref 5–34)
BILIRUBIN TOTAL: 0.6 mg/dL (ref 0.2–1.2)
BUN: 25 mg/dL (ref 7–26)
CALCIUM: 9.8 mg/dL (ref 8.4–10.4)
CO2: 19 mmol/L — AB (ref 22–29)
CREATININE: 1.54 mg/dL — AB (ref 0.60–1.10)
Chloride: 113 mmol/L — ABNORMAL HIGH (ref 98–109)
GFR, EST AFRICAN AMERICAN: 38 mL/min — AB (ref >60)
GFR, Estimated: 33 mL/min — ABNORMAL LOW (ref >60)
GLUCOSE: 92 mg/dL (ref 70–140)
Potassium: 3.8 mmol/L (ref 3.5–5.1)
SODIUM: 144 mmol/L (ref 136–145)
TOTAL PROTEIN: 8.1 g/dL (ref 6.4–8.3)

## 2018-03-05 NOTE — Progress Notes (Signed)
[facility] OFFICE PROGRESS NOTE   Diagnosis: Multiple myeloma  INTERVAL HISTORY:   Abigail [name] returns as scheduled.  She remains off of specific therapy for myeloma.  No chest pain.  She complains of malaise.  She has discomfort in both legs and feet.  Objective:  Vital signs in last 24 hours:  Blood pressure (!) 180/79, pulse (!) 56, temperature 98.9 F (37.2 C), temperature source Oral, resp. rate 18, height '5\' 3"'  (1.6 m), weight 180 lb 8 oz (81.9 kg).    Resp: Lungs clear bilaterally Cardio: Regular rate and rhythm GI: No hepatosplenomegaly Vascular: No leg edema     Lab Results:  Lab Results  Component Value Date   WBC 5.9 03/05/2018   HGB 11.6 03/05/2018   HCT 37.1 03/05/2018   MCV 91.4 03/05/2018   PLT 231 03/05/2018   NEUTROABS 3.9 03/05/2018    CMP     Component Value Date/Time   NA 145 01/21/2018 1457   NA 144 09/17/2017 1429   K 3.7 01/21/2018 1457   K 3.1 (L) 09/17/2017 1429   CL 111 (H) 01/21/2018 1457   CO2 25 01/21/2018 1457   CO2 23 09/17/2017 1429   GLUCOSE 81 01/21/2018 1457   GLUCOSE 96 09/17/2017 1429   BUN 20 01/21/2018 1457   BUN 15.6 09/17/2017 1429   CREATININE 1.22 (H) 01/21/2018 1457   CREATININE 1.0 09/17/2017 1429   CALCIUM 9.5 01/21/2018 1457   CALCIUM 8.6 09/17/2017 1429   PROT 6.7 01/21/2018 1457   PROT 6.6 09/17/2017 1429   ALBUMIN 3.7 01/21/2018 1457   ALBUMIN 3.6 09/17/2017 1429   AST 16 01/21/2018 1457   AST 14 09/17/2017 1429   ALT 12 01/21/2018 1457   ALT 11 09/17/2017 1429   ALKPHOS 106 01/21/2018 1457   ALKPHOS 93 09/17/2017 1429   BILITOT 0.5 01/21/2018 1457   BILITOT 1.22 (H) 09/17/2017 1429   GFRNONAA 43 (L) 01/21/2018 1457   GFRAA 50 (L) 01/21/2018 1457    Medications: I have reviewed the patient's current medications.   Assessment/Plan: 1. Multiple myeloma, IgG lambda, bone marrow biopsy 06/15/2016 confirmed multiple myeloma; cytogenetics by FISHshow +11, +12, 13q-  Elevated serum  free lambda light chains  Lambda light chain proteinuria  Lytic bone lesions on a bone survey 06/13/2016  Initiation of weekly Velcade/Decadron 06/19/2016  Serum light chains improved 07/31/2016  Initiation of Revlimid 11/03/20172 weeks on/2 weeks off  Serum M spike and IgG significantly improved 09/25/2016  Velcade held on 10/30/2016 due to urinary retention  Treatment resumed with Revlimid (2 weeks on/2 weeks off) and weekly Decadron on 02/12/2017, treatment placed on hold May 2018 secondary to admissions with cardiac disease  2. Severe anemia secondary to #1-markedly improved  3. Diffuse lytic bone lesions secondary to multiple myeloma, status post Zometa 09/18/2016(plan to continue every 3 months);most recent Zometa infusion 10/08/2017  4. History of coronary artery disease/myocardial infarction  5. History of colon polyps  6. Bilateral leg edema 09/04/2016. Negative bilateral venous Doppler 09/05/2016.  7. Mild periorbital edema 09/18/2016, question related to early stye formation, question Velcade related chalazia.Improved.  8. CT scan 10/27/2016 with a new right kidney massevaluated by urology; MRI abdomen 11/18/2016 multiple bilateral renal cysts. Most of these are simple cysts. The 2 lesions in question on the CT scan are both hemorrhagic or proteinaceous cysts. No worrisome contrast enhancement.  9. Urinary retention 10/28/2016 status post evaluation in the emergency department. Urinalysis negative for signs of infection 10/30/2016. Velcade held.  Resolved.  10. Abdominal pain, nausea, vomiting.Etiology unclear. Upper endoscopy 11/18/2016 with findings of reflux esophagitis, benign appearing esophageal stenosis, small hiatal hernia and erythematous duodenopathy. CT abdomen/pelvis 11/19/2016 with no acute abnormality.  11. 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 Left lower extremity DVT May 2018.Maintainedon  Coumadin.Coumadin discontinued October 2018 secondary to a supratherapeutic PT/INR and GI bleeding  13.Admission with GI bleeding October and November 2018-felt secondary to diverticular bleeding, initially while on supratherapeutic Coumadin, colonoscopy 09/04/2017-no source of blood loss identified, superficial tear at the posterior anal mucosa without stigmata of bleeding  Disposition: Abigail [name] is stable from a hematologic standpoint.  No clinical evidence for progression of myeloma.  The serum light chains and serum M spike were slightly higher last month.  We will follow-up on the myeloma panel from today.  She will return for an office and lab visit in 6 weeks.  She continues every 3 months Zometa.  [name], MD  03/05/2018  11:48 AM

## 2018-03-06 LAB — IGG: IGG (IMMUNOGLOBIN G), SERUM: 1455 mg/dL (ref 700–1600)

## 2018-03-08 LAB — KAPPA/LAMBDA LIGHT CHAINS
KAPPA FREE LGHT CHN: 11 mg/L (ref 3.3–19.4)
Kappa, lambda light chain ratio: 0.25 — ABNORMAL LOW (ref 0.26–1.65)
LAMDA FREE LIGHT CHAINS: 44.8 mg/L — AB (ref 5.7–26.3)

## 2018-03-10 LAB — PROTEIN ELECTROPHORESIS, SERUM
A/G RATIO SPE: 1.2 (ref 0.7–1.7)
ALPHA-2-GLOBULIN: 0.8 g/dL (ref 0.4–1.0)
Albumin ELP: 3.9 g/dL (ref 2.9–4.4)
Alpha-1-Globulin: 0.3 g/dL (ref 0.0–0.4)
Beta Globulin: 0.8 g/dL (ref 0.7–1.3)
GAMMA GLOBULIN: 1.3 g/dL (ref 0.4–1.8)
Globulin, Total: 3.2 g/dL (ref 2.2–3.9)
M-Spike, %: 0.9 g/dL — ABNORMAL HIGH
Total Protein ELP: 7.1 g/dL (ref 6.0–8.5)

## 2018-04-08 NOTE — Telephone Encounter (Signed)
Error opening  

## 2018-04-15 ENCOUNTER — Telehealth: Payer: Self-pay | Admitting: Nurse Practitioner

## 2018-04-15 ENCOUNTER — Encounter: Payer: Self-pay | Admitting: Nurse Practitioner

## 2018-04-15 ENCOUNTER — Telehealth: Payer: Self-pay | Admitting: *Deleted

## 2018-04-15 ENCOUNTER — Inpatient Hospital Stay: Payer: Medicare HMO | Attending: Nurse Practitioner | Admitting: Nurse Practitioner

## 2018-04-15 ENCOUNTER — Inpatient Hospital Stay: Payer: Medicare HMO

## 2018-04-15 VITALS — BP 179/54 | HR 71 | Temp 99.5°F | Resp 17 | Ht 63.0 in | Wt 184.4 lb

## 2018-04-15 DIAGNOSIS — I2510 Atherosclerotic heart disease of native coronary artery without angina pectoris: Secondary | ICD-10-CM | POA: Diagnosis not present

## 2018-04-15 DIAGNOSIS — Z86010 Personal history of colonic polyps: Secondary | ICD-10-CM | POA: Insufficient documentation

## 2018-04-15 DIAGNOSIS — Z86718 Personal history of other venous thrombosis and embolism: Secondary | ICD-10-CM | POA: Diagnosis not present

## 2018-04-15 DIAGNOSIS — I252 Old myocardial infarction: Secondary | ICD-10-CM | POA: Diagnosis not present

## 2018-04-15 DIAGNOSIS — C9001 Multiple myeloma in remission: Secondary | ICD-10-CM

## 2018-04-15 DIAGNOSIS — C9000 Multiple myeloma not having achieved remission: Secondary | ICD-10-CM

## 2018-04-15 DIAGNOSIS — D630 Anemia in neoplastic disease: Secondary | ICD-10-CM | POA: Diagnosis not present

## 2018-04-15 LAB — CBC WITH DIFFERENTIAL (CANCER CENTER ONLY)
Basophils Absolute: 0 10*3/uL (ref 0.0–0.1)
Basophils Relative: 1 %
EOS ABS: 0.2 10*3/uL (ref 0.0–0.5)
Eosinophils Relative: 2 %
HEMATOCRIT: 32 % — AB (ref 34.8–46.6)
HEMOGLOBIN: 10.1 g/dL — AB (ref 11.6–15.9)
LYMPHS ABS: 1.7 10*3/uL (ref 0.9–3.3)
LYMPHS PCT: 26 %
MCH: 28.6 pg (ref 25.1–34.0)
MCHC: 31.6 g/dL (ref 31.5–36.0)
MCV: 90.7 fL (ref 79.5–101.0)
MONOS PCT: 10 %
Monocytes Absolute: 0.6 10*3/uL (ref 0.1–0.9)
NEUTROS PCT: 61 %
Neutro Abs: 3.9 10*3/uL (ref 1.5–6.5)
Platelet Count: 181 10*3/uL (ref 145–400)
RBC: 3.53 MIL/uL — AB (ref 3.70–5.45)
RDW: 16.9 % — ABNORMAL HIGH (ref 11.2–14.5)
WBC: 6.5 10*3/uL (ref 3.9–10.3)

## 2018-04-15 LAB — CMP (CANCER CENTER ONLY)
ALT: 15 U/L (ref 0–44)
AST: 16 U/L (ref 15–41)
Albumin: 3.8 g/dL (ref 3.5–5.0)
Alkaline Phosphatase: 107 U/L (ref 38–126)
Anion gap: 8 (ref 5–15)
BUN: 21 mg/dL (ref 8–23)
CHLORIDE: 109 mmol/L (ref 98–111)
CO2: 27 mmol/L (ref 22–32)
Calcium: 9.1 mg/dL (ref 8.9–10.3)
Creatinine: 1.26 mg/dL — ABNORMAL HIGH (ref 0.44–1.00)
GFR, EST AFRICAN AMERICAN: 48 mL/min — AB (ref >60)
GFR, Estimated: 42 mL/min — ABNORMAL LOW (ref >60)
Glucose, Bld: 95 mg/dL (ref 70–99)
POTASSIUM: 2.9 mmol/L — AB (ref 3.5–5.1)
SODIUM: 144 mmol/L (ref 135–145)
Total Bilirubin: 0.8 mg/dL (ref 0.3–1.2)
Total Protein: 7.6 g/dL (ref 6.5–8.1)

## 2018-04-15 MED ORDER — POTASSIUM CHLORIDE CRYS ER 20 MEQ PO TBCR: EXTENDED_RELEASE_TABLET | ORAL | 1 refills | Status: DC

## 2018-04-15 MED ORDER — ZOLEDRONIC ACID 4 MG/5ML IV CONC
3.5000 mg | Freq: Once | INTRAVENOUS | Status: AC
  Administered 2018-04-15: 3.5 mg via INTRAVENOUS
  Filled 2018-04-15: qty 4.38

## 2018-04-15 NOTE — Patient Instructions (Signed)

## 2018-04-15 NOTE — Addendum Note (Signed)
Addended by: Owens Shark on: 04/15/2018 03:33 PM   Modules accepted: Orders

## 2018-04-15 NOTE — Progress Notes (Signed)
[name] OFFICE PROGRESS NOTE   Diagnosis: Multiple myeloma  INTERVAL HISTORY:   Abigail [name] returns as scheduled.  Overall feels well.  She recently "pulled a muscle" in her back.  The pain is improving.  She denies nausea/vomiting.  No constipation or diarrhea.  She has periodic shortness of breath and takes furosemide as needed.  Objective:  Vital signs in last 24 hours:  Blood pressure (!) 179/54, pulse 71, temperature 99.5 F (37.5 C), temperature source Oral, resp. rate 17, height 5' 3" (1.6 m), weight 184 lb 6.4 oz (83.6 kg), SpO2 97 %.    HEENT: No thrush or ulcers. Resp: Lungs clear bilaterally. Cardio: Regular rate and rhythm. GI: Abdomen soft and nontender.  No hepatomegaly. Vascular: No leg edema.    Lab Results:  Lab Results  Component Value Date   WBC 6.5 04/15/2018   HGB 10.1 (L) 04/15/2018   HCT 32.0 (L) 04/15/2018   MCV 90.7 04/15/2018   PLT 181 04/15/2018   NEUTROABS 3.9 04/15/2018    Imaging:  No results found.  Medications: I have reviewed the patient's current medications.  Assessment/Plan: 1. Multiple myeloma, IgG lambda, bone marrow biopsy 06/15/2016 confirmed multiple myeloma; cytogenetics by FISHshow +11, +12, 13q-  Elevated serum free lambda light chains  Lambda light chain proteinuria  Lytic bone lesions on a bone survey 06/13/2016  Initiation of weekly Velcade/Decadron 06/19/2016  Serum light chains improved 07/31/2016  Initiation of Revlimid 11/03/20172 weeks on/2 weeks off  Serum M spike and IgG significantly improved 09/25/2016  Velcade held on 10/30/2016 due to urinary retention  Treatment resumed with Revlimid (2 weeks on/2 weeks off) and weekly Decadron on 02/12/2017, treatment placed on hold May 2018 secondary to admissions with cardiac disease  2. Severe anemia secondary to #1-markedly improved  3. Diffuse lytic bone lesions secondary to multiple myeloma, status post Zometa 09/18/2016(plan to  continue every 3 months);most recent Zometa infusion 10/08/2017  4. History of coronary artery disease/myocardial infarction  5. History of colon polyps  6. Bilateral leg edema 09/04/2016. Negative bilateral venous Doppler 09/05/2016.  7. Mild periorbital edema 09/18/2016, question related to early stye formation, question Velcade related chalazia.Improved.  8. CT scan 10/27/2016 with a new right kidney massevaluated by urology; MRI abdomen 11/18/2016 multiple bilateral renal cysts. Most of these are simple cysts. The 2 lesions in question on the CT scan are both hemorrhagic or proteinaceous cysts. No worrisome contrast enhancement.  9. Urinary retention 10/28/2016 status post evaluation in the emergency department. Urinalysis negative for signs of infection 10/30/2016. Velcade held. Resolved.  10. Abdominal pain, nausea, vomiting.Etiology unclear. Upper endoscopy 11/18/2016 with findings of reflux esophagitis, benign appearing esophageal stenosis, small hiatal hernia and erythematous duodenopathy. CT abdomen/pelvis 11/19/2016 with no acute abnormality.  11. 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 Left lower extremity DVT May 2018.Maintainedon Coumadin.Coumadin discontinued October 2018 secondary to a supratherapeutic PT/INR and GI bleeding  13.Admission with GI bleeding October and November 2018-felt secondary to diverticular bleeding, initially while on supratherapeutic Coumadin, colonoscopy 09/04/2017-no source of blood loss identified, superficial tear at the posterior anal mucosa without stigmata of bleeding     Disposition: Abigail [name] appears stable.  The serum light chains and serum M spike were slightly higher last month.  We will follow-up on the results from today.  She is scheduled to receive Zometa today.  She will return for lab and a follow-up visit in 6 weeks.    [name] ANP/GNP-BC  04/15/2018  1:50  PM

## 2018-04-15 NOTE — Telephone Encounter (Signed)
Scheduled appt per 6/27 los - pt kept picking up and hanging up when attempted to call - sent reminder letter in the mail of Aug appts.

## 2018-04-15 NOTE — Telephone Encounter (Signed)
Called patient to advise that her potassium levels were low and that she needs to pick up the prescription called to her pharmacy.  Advised to call if they have any questions.  LM

## 2018-04-16 LAB — KAPPA/LAMBDA LIGHT CHAINS
KAPPA FREE LGHT CHN: 9.1 mg/L (ref 3.3–19.4)
KAPPA, LAMDA LIGHT CHAIN RATIO: 0.18 — AB (ref 0.26–1.65)
LAMDA FREE LIGHT CHAINS: 51.5 mg/L — AB (ref 5.7–26.3)

## 2018-04-16 LAB — PROTEIN ELECTROPHORESIS, SERUM
A/G RATIO SPE: 1.2 (ref 0.7–1.7)
Albumin ELP: 3.9 g/dL (ref 2.9–4.4)
Alpha-1-Globulin: 0.3 g/dL (ref 0.0–0.4)
Alpha-2-Globulin: 0.7 g/dL (ref 0.4–1.0)
BETA GLOBULIN: 0.8 g/dL (ref 0.7–1.3)
GLOBULIN, TOTAL: 3.3 g/dL (ref 2.2–3.9)
Gamma Globulin: 1.5 g/dL (ref 0.4–1.8)
M-SPIKE, %: 1.2 g/dL — AB
Total Protein ELP: 7.2 g/dL (ref 6.0–8.5)

## 2018-05-27 ENCOUNTER — Telehealth: Payer: Self-pay | Admitting: Oncology

## 2018-05-27 ENCOUNTER — Encounter: Payer: Self-pay | Admitting: Nurse Practitioner

## 2018-05-27 ENCOUNTER — Inpatient Hospital Stay: Payer: Medicare HMO | Attending: Nurse Practitioner | Admitting: Nurse Practitioner

## 2018-05-27 ENCOUNTER — Inpatient Hospital Stay: Payer: Medicare HMO

## 2018-05-27 VITALS — BP 151/69 | HR 67 | Temp 98.7°F | Resp 17 | Ht 63.0 in | Wt 184.7 lb

## 2018-05-27 DIAGNOSIS — M545 Low back pain: Secondary | ICD-10-CM | POA: Insufficient documentation

## 2018-05-27 DIAGNOSIS — I252 Old myocardial infarction: Secondary | ICD-10-CM | POA: Insufficient documentation

## 2018-05-27 DIAGNOSIS — Z7901 Long term (current) use of anticoagulants: Secondary | ICD-10-CM | POA: Insufficient documentation

## 2018-05-27 DIAGNOSIS — C9002 Multiple myeloma in relapse: Secondary | ICD-10-CM | POA: Diagnosis present

## 2018-05-27 DIAGNOSIS — D630 Anemia in neoplastic disease: Secondary | ICD-10-CM | POA: Diagnosis not present

## 2018-05-27 DIAGNOSIS — C9000 Multiple myeloma not having achieved remission: Secondary | ICD-10-CM

## 2018-05-27 DIAGNOSIS — C9001 Multiple myeloma in remission: Secondary | ICD-10-CM | POA: Diagnosis not present

## 2018-05-27 DIAGNOSIS — Z86718 Personal history of other venous thrombosis and embolism: Secondary | ICD-10-CM | POA: Insufficient documentation

## 2018-05-27 DIAGNOSIS — I2510 Atherosclerotic heart disease of native coronary artery without angina pectoris: Secondary | ICD-10-CM | POA: Diagnosis not present

## 2018-05-27 LAB — CBC WITH DIFFERENTIAL (CANCER CENTER ONLY)
Basophils Absolute: 0 10*3/uL (ref 0.0–0.1)
Basophils Relative: 1 %
EOS ABS: 0.1 10*3/uL (ref 0.0–0.5)
Eosinophils Relative: 2 %
HCT: 30.7 % — ABNORMAL LOW (ref 34.8–46.6)
HEMOGLOBIN: 9.8 g/dL — AB (ref 11.6–15.9)
LYMPHS ABS: 1.3 10*3/uL (ref 0.9–3.3)
LYMPHS PCT: 22 %
MCH: 29.1 pg (ref 25.1–34.0)
MCHC: 31.8 g/dL (ref 31.5–36.0)
MCV: 91.3 fL (ref 79.5–101.0)
MONOS PCT: 14 %
Monocytes Absolute: 0.8 10*3/uL (ref 0.1–0.9)
NEUTROS PCT: 61 %
Neutro Abs: 3.5 10*3/uL (ref 1.5–6.5)
Platelet Count: 210 10*3/uL (ref 145–400)
RBC: 3.36 MIL/uL — ABNORMAL LOW (ref 3.70–5.45)
RDW: 18.4 % — ABNORMAL HIGH (ref 11.2–14.5)
WBC Count: 5.7 10*3/uL (ref 3.9–10.3)

## 2018-05-27 LAB — CMP (CANCER CENTER ONLY)
ALK PHOS: 98 U/L (ref 38–126)
ALT: 11 U/L (ref 0–44)
AST: 18 U/L (ref 15–41)
Albumin: 3.6 g/dL (ref 3.5–5.0)
Anion gap: 8 (ref 5–15)
BILIRUBIN TOTAL: 0.6 mg/dL (ref 0.3–1.2)
BUN: 16 mg/dL (ref 8–23)
CALCIUM: 8.3 mg/dL — AB (ref 8.9–10.3)
CO2: 17 mmol/L — AB (ref 22–32)
CREATININE: 1.38 mg/dL — AB (ref 0.44–1.00)
Chloride: 117 mmol/L — ABNORMAL HIGH (ref 98–111)
GFR, EST AFRICAN AMERICAN: 43 mL/min — AB (ref >60)
GFR, EST NON AFRICAN AMERICAN: 37 mL/min — AB (ref >60)
Glucose, Bld: 103 mg/dL — ABNORMAL HIGH (ref 70–99)
Potassium: 3.3 mmol/L — ABNORMAL LOW (ref 3.5–5.1)
SODIUM: 142 mmol/L (ref 135–145)
TOTAL PROTEIN: 8.4 g/dL — AB (ref 6.5–8.1)

## 2018-05-27 NOTE — Progress Notes (Signed)
[facility] OFFICE PROGRESS NOTE   Diagnosis: Multiple myeloma  INTERVAL HISTORY:   Abigail [name] returns as scheduled.  She overall is feeling well.  She has a good appetite.  No interim illnesses or infections.  No bowel or bladder problems.  No shortness of breath.  No bleeding.  She reports pain at the left low back due to "a pulled muscle and arthritis".  The pain is better.  No leg weakness or numbness.  Objective:  Vital signs in last 24 hours:  Blood pressure (!) 151/69, pulse 67, temperature 98.7 F (37.1 C), temperature source Oral, resp. rate 17, height _0  (1.6 m), weight 184 lb 11.2 oz (83.8 kg), SpO2 96 %.    HEENT: No thrush or ulcers. Resp: Lungs clear bilaterally. Cardio: Regular rate and rhythm. GI: Abdomen soft and nontender.  No hepatomegaly. Vascular: No leg edema.    Lab Results:  Lab Results  Component Value Date   WBC 5.7 05/27/2018   HGB 9.8 (L) 05/27/2018   HCT 30.7 (L) 05/27/2018   MCV 91.3 05/27/2018   PLT 210 05/27/2018   NEUTROABS 3.5 05/27/2018    Imaging:  No results found.  Medications: I have reviewed the patient's current medications.  Assessment/Plan: 1. Multiple myeloma, IgG lambda, bone marrow biopsy 06/15/2016 confirmed multiple myeloma; cytogenetics by FISHshow +11, +12, 13q-  Elevated serum free lambda light chains  Lambda light chain proteinuria  Lytic bone lesions on a bone survey 06/13/2016  Initiation of weekly Velcade/Decadron 06/19/2016  Serum light chains improved 07/31/2016  Initiation of Revlimid 11/03/20172 weeks on/2 weeks off  Serum M spike and IgG significantly improved 09/25/2016  Velcade held on 10/30/2016 due to urinary retention  Treatment resumed with Revlimid (2 weeks on/2 weeks off) and weekly Decadron on 02/12/2017, treatment placed on hold May 2018 secondary to admissions with cardiac disease  2. Severe anemia secondary to #1-markedly improved  3. Diffuse lytic bone lesions  secondary to multiple myeloma, status post Zometa 09/18/2016(plan to continue every 3 months);most recent Zometa infusion 10/08/2017  4. History of coronary artery disease/myocardial infarction  5. History of colon polyps  6. Bilateral leg edema 09/04/2016. Negative bilateral venous Doppler 09/05/2016.  7. Mild periorbital edema 09/18/2016, question related to early stye formation, question Velcade related chalazia.Improved.  8. CT scan 10/27/2016 with a new right kidney massevaluated by urology; MRI abdomen 11/18/2016 multiple bilateral renal cysts. Most of these are simple cysts. The 2 lesions in question on the CT scan are both hemorrhagic or proteinaceous cysts. No worrisome contrast enhancement.  9. Urinary retention 10/28/2016 status post evaluation in the emergency department. Urinalysis negative for signs of infection 10/30/2016. Velcade held. Resolved.  10. Abdominal pain, nausea, vomiting.Etiology unclear. Upper endoscopy 11/18/2016 with findings of reflux esophagitis, benign appearing esophageal stenosis, small hiatal hernia and erythematous duodenopathy. CT abdomen/pelvis 11/19/2016 with no acute abnormality.  11. 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 Left lower extremity DVT May 2018.Maintainedon Coumadin.Coumadin discontinued October 2018 secondary to a supratherapeutic PT/INR and GI bleeding  13.Admission with GI bleeding October and November 2018-felt secondary to diverticular bleeding, initially while on supratherapeutic Coumadin, colonoscopy 09/04/2017-no source of blood loss identified, superficial tear at the posterior anal mucosa without stigmata of bleeding   Disposition: Abigail [name] appears stable.  We reviewed the myeloma labs from June.  Serum light chains with stable elevation; serum M spike higher.  She has mild progression of anemia.  She understands we may need to  resume treatment for the myeloma in the near  future.  We will follow-up on the myeloma labs from today.  We are referring her for a bone survey.  She will return for a follow-up visit in approximately 2 weeks to review those results.  She will contact the office in the interim with any problems.  Plan reviewed with Dr. Benay Spice.    [name] ANP/GNP-BC   05/27/2018  2:14 PM

## 2018-05-27 NOTE — Telephone Encounter (Signed)
Appts scheduled AVS/Calendar printed per 8/8 los °

## 2018-05-28 ENCOUNTER — Other Ambulatory Visit: Payer: Self-pay | Admitting: Internal Medicine

## 2018-05-28 DIAGNOSIS — E2839 Other primary ovarian failure: Secondary | ICD-10-CM

## 2018-05-28 LAB — KAPPA/LAMBDA LIGHT CHAINS
Kappa free light chain: 8.5 mg/L (ref 3.3–19.4)
Kappa, lambda light chain ratio: 0.08 — ABNORMAL LOW (ref 0.26–1.65)
Lambda free light chains: 105.7 mg/L — ABNORMAL HIGH (ref 5.7–26.3)

## 2018-05-28 LAB — IGG: IgG (Immunoglobin G), Serum: 2531 mg/dL — ABNORMAL HIGH (ref 700–1600)

## 2018-05-29 LAB — PROTEIN ELECTROPHORESIS, SERUM
A/G RATIO SPE: 0.9 (ref 0.7–1.7)
ALBUMIN ELP: 3.7 g/dL (ref 2.9–4.4)
ALPHA-1-GLOBULIN: 0.3 g/dL (ref 0.0–0.4)
ALPHA-2-GLOBULIN: 0.9 g/dL (ref 0.4–1.0)
Beta Globulin: 0.9 g/dL (ref 0.7–1.3)
GLOBULIN, TOTAL: 4.3 g/dL — AB (ref 2.2–3.9)
Gamma Globulin: 2.1 g/dL — ABNORMAL HIGH (ref 0.4–1.8)
M-Spike, %: 1.9 g/dL — ABNORMAL HIGH
Total Protein ELP: 8 g/dL (ref 6.0–8.5)

## 2018-05-31 ENCOUNTER — Ambulatory Visit (HOSPITAL_COMMUNITY)
Admission: RE | Admit: 2018-05-31 | Discharge: 2018-05-31 | Disposition: A | Discharge status: Home / Self Care | Payer: Medicare HMO | Source: Ambulatory Visit | Attending: Nurse Practitioner | Admitting: Nurse Practitioner

## 2018-05-31 DIAGNOSIS — C9000 Multiple myeloma not having achieved remission: Secondary | ICD-10-CM

## 2018-06-15 ENCOUNTER — Inpatient Hospital Stay (HOSPITAL_BASED_OUTPATIENT_CLINIC_OR_DEPARTMENT_OTHER): Payer: Medicare HMO | Admitting: Nurse Practitioner

## 2018-06-15 ENCOUNTER — Encounter: Payer: Self-pay | Admitting: Nurse Practitioner

## 2018-06-15 ENCOUNTER — Telehealth: Payer: Self-pay | Admitting: Oncology

## 2018-06-15 VITALS — BP 131/54 | HR 57 | Temp 98.4°F | Resp 18 | Ht 63.0 in | Wt 179.0 lb

## 2018-06-15 DIAGNOSIS — I252 Old myocardial infarction: Secondary | ICD-10-CM

## 2018-06-15 DIAGNOSIS — D630 Anemia in neoplastic disease: Secondary | ICD-10-CM | POA: Diagnosis not present

## 2018-06-15 DIAGNOSIS — M545 Low back pain: Secondary | ICD-10-CM | POA: Diagnosis not present

## 2018-06-15 DIAGNOSIS — C9002 Multiple myeloma in relapse: Secondary | ICD-10-CM

## 2018-06-15 DIAGNOSIS — C9000 Multiple myeloma not having achieved remission: Secondary | ICD-10-CM

## 2018-06-15 DIAGNOSIS — Z7901 Long term (current) use of anticoagulants: Secondary | ICD-10-CM

## 2018-06-15 DIAGNOSIS — Z452 Encounter for adjustment and management of vascular access device: Secondary | ICD-10-CM | POA: Insufficient documentation

## 2018-06-15 DIAGNOSIS — Z7189 Other specified counseling: Secondary | ICD-10-CM

## 2018-06-15 DIAGNOSIS — Z86718 Personal history of other venous thrombosis and embolism: Secondary | ICD-10-CM

## 2018-06-15 DIAGNOSIS — I2510 Atherosclerotic heart disease of native coronary artery without angina pectoris: Secondary | ICD-10-CM

## 2018-06-15 MED ORDER — DEXAMETHASONE 4 MG PO TABS: ORAL_TABLET | ORAL | 1 refills | Status: DC

## 2018-06-15 NOTE — Telephone Encounter (Signed)
Appts scheduled AVS/Calendar printed per 8/27 los °

## 2018-06-15 NOTE — Progress Notes (Signed)
START ON PATHWAY REGIMEN - Multiple Myeloma and Other Plasma Cell Dyscrasias     A cycle is every 28 days:     Bortezomib      Cyclophosphamide      Dexamethasone   **Always confirm dose/schedule in your pharmacy ordering system**  Patient Characteristics: Relapsed / Refractory, All Lines of Therapy R-ISS Staging: Not Applicable Disease Classification: Relapsed Line of Therapy: Second Line Intent of Therapy: Non-Curative / Palliative Intent, Discussed with Patient

## 2018-06-15 NOTE — Progress Notes (Addendum)
[place] OFFICE PROGRESS NOTE   Diagnosis: Multiple myeloma  INTERVAL HISTORY:   Abigail [name] returns as scheduled.  Left low back pain continues to improve.  No nausea or vomiting.  No mouth sores.  No diarrhea.  Overall good appetite.  No fever or chills.  She notes intermittent swelling of the right lower leg.  She recently noted a "knot" at the right lower leg.  Objective:  Vital signs in last 24 hours:  Blood pressure (!) 131/54, pulse (!) 57, temperature 98.4 F (36.9 C), temperature source Oral, resp. rate 18, height '5\' 3"'$  (1.6 m), weight 179 lb (81.2 kg), SpO2 100 %.    HEENT: Mild white coating over tongue. Resp: Lungs clear bilaterally. Cardio: Regular rate and rhythm. GI: Abdomen soft and nontender.  No hepatospleno megaly. Vascular: Trace edema at the right ankle.  Soft fullness right lower outer leg, mildly tender, no erythema. Neuro: Alert and oriented. Skin: No rash.   Lab Results:  Lab Results  Component Value Date   WBC 5.7 05/27/2018   HGB 9.8 (L) 05/27/2018   HCT 30.7 (L) 05/27/2018   MCV 91.3 05/27/2018   PLT 210 05/27/2018   NEUTROABS 3.5 05/27/2018    Imaging:  No results found.  Medications: I have reviewed the patient's current medications.  Assessment/Plan: 1. Multiple myeloma, IgG lambda, bone marrow biopsy 06/15/2016 confirmed multiple myeloma; cytogenetics by FISHshow +11, +12, 13q-  Elevated serum free lambda light chains  Lambda light chain proteinuria  Lytic bone lesions on a bone survey 06/13/2016  Initiation of weekly Velcade/Decadron 06/19/2016  Serum light chains improved 07/31/2016  Initiation of Revlimid 11/03/20172 weeks on/2 weeks off  Serum M spike and IgG significantly improved 09/25/2016  Velcade held on 10/30/2016 due to urinary retention  Treatment resumed with Revlimid (2 weeks on/2 weeks off) and weekly Decadron on 02/12/2017, treatment placed on hold May 2018 secondary to admissions with  cardiac disease   05/27/2018-increased serum M spike, IgG, serum lambda light chains  Bone survey 05/31/2018- widespread myeloma without detectable progression since 06/13/2016. 2. Severe anemia secondary to #1-markedly improved  3. Diffuse lytic bone lesions secondary to multiple myeloma, status post Zometa 09/18/2016(plan to continue every 3 months);most recent Zometa infusion 10/08/2017  4. History of coronary artery disease/myocardial infarction  5. History of colon polyps  6. Bilateral leg edema 09/04/2016. Negative bilateral venous Doppler 09/05/2016.  7. Mild periorbital edema 09/18/2016, question related to early stye formation, question Velcade related chalazia.Improved.  8. CT scan 10/27/2016 with a new right kidney massevaluated by urology; MRI abdomen 11/18/2016 multiple bilateral renal cysts. Most of these are simple cysts. The 2 lesions in question on the CT scan are both hemorrhagic or proteinaceous cysts. No worrisome contrast enhancement.  9. Urinary retention 10/28/2016 status post evaluation in the emergency department. Urinalysis negative for signs of infection 10/30/2016. Velcade held. Resolved.  10. Abdominal pain, nausea, vomiting.Etiology unclear. Upper endoscopy 11/18/2016 with findings of reflux esophagitis, benign appearing esophageal stenosis, small hiatal hernia and erythematous duodenopathy. CT abdomen/pelvis 11/19/2016 with no acute abnormality.  11. 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 Left lower extremity DVT May 2018.Maintainedon Coumadin.Coumadin discontinued October 2018 secondary to a supratherapeutic PT/INR and GI bleeding  13.Admission with GI bleeding October and November 2018-felt secondary to diverticular bleeding, initially while on supratherapeutic Coumadin, colonoscopy 09/04/2017-no source of blood loss identified, superficial tear at the posterior anal mucosa without stigmata of  bleeding   Disposition: Abigail [name] appears  unchanged.  We reviewed the myeloma labs from 05/27/2018.  She understands the myeloma appears to be progressing.  Dr. Benay Spice recommends resuming treatment of the myeloma with Cytoxan/Velcade/dexamethasone weekly x3 followed by a 1 week break.  We reviewed potential toxicities associated with the chemotherapy including bone marrow toxicity, hair loss, nausea, mouth sores.  We discussed the neuropathy associated with Velcade.  She has taken steroids in the past and is familiar with the potential toxicities.  She will return for cycle 1 day 1 Velcade on 06/24/2018, day 8 Velcade 07/01/2018.  Prescriptions for dexamethasone and Cytoxan will be sent to her pharmacy.  We will see her in follow-up on 07/08/2018.  She will contact the office in the interim with any problems.  We specifically discussed progressive swelling/discomfort at the right lower leg.  Patient seen with Dr. Benay Spice.  25 minutes were spent face-to-face at today's visit with the majority of that time involved in counseling/coordination of care.    [name] ANP/GNP-BC   06/15/2018  11:14 AM  This was a shared visit with [name].  Abigail [name] interviewed and examined.  There is evidence of progressive myeloma.  She will resume treatment with Cytoxan/Velcade/Decadron.  We reviewed potential toxicities associated with this regimen and she agrees to proceed.  The etiology of the soft induration at the right lower leg is unclear.  I have a low clinical suspicion for a superficial thrombus.  Julieanne Manson, MD

## 2018-06-16 ENCOUNTER — Telehealth: Payer: Self-pay | Admitting: Pharmacist

## 2018-06-16 ENCOUNTER — Telehealth: Payer: Self-pay

## 2018-06-16 DIAGNOSIS — C9002 Multiple myeloma in relapse: Secondary | ICD-10-CM

## 2018-06-16 MED ORDER — CYCLOPHOSPHAMIDE 50 MG PO CAPS: ORAL_CAPSULE | ORAL | 0 refills | Status: DC

## 2018-06-16 NOTE — Telephone Encounter (Signed)
Oral Oncology Patient Advocate Encounter  Received notification from [facility] that prior authorization for Cyclophosphamide is required.  PA submitted on CoverMyMeds Key [id number] Status is pending  Oral Oncology Clinic will continue to follow.  [place] Patient [place] Phone [phone] Fax [phone]

## 2018-06-16 NOTE — Telephone Encounter (Signed)
Oral Oncology Pharmacist Encounter  Received new prescription for Cytoxan (cyclophosphamide) for the treatment of multiple myeloma in remission in conjunction with pulse dexamethasone and Velcade, planned duration 4-6 cycles and then reassess.  Cyclophosphamide (~300 mg/m2 oral), pulse dexamethasone (47m oral), and Velcade (1.5 mg/m2 subQ) will be administered once weekly for 3 weeks on, 1 week off, then repeated  Labs from 05/27/18 assessed Noted SCr=1.38, est CrCl ~ 45 mL/min, OK for treatment  Current medication list in Epic reviewed, no DDIs with cyclophosphamide identified.  [facility].  Oral Oncology Clinic will continue to follow for insurance authorization, copayment issues, initial counseling and start date.  JJohny Drilling PharmD, BCPS, BCOP  06/16/2018 1:34 PM Oral Oncology Clinic [phone]

## 2018-06-16 NOTE — Telephone Encounter (Signed)
Oral Chemotherapy Pharmacist Encounter   I spoke with patient for overview of: cyclophosphamide for the treatment of multiple myeloma in remission in conjunction with pulse dexamethasone and Velcade, planned duration 4-6 cycles and then reassess.  Counseled patient on administration, dosing, side effects, monitoring, drug-food interactions, safe handling, storage, and disposal.  Patient will take cyclophosphamide 37m capsules, 10 capsules (5026m by mouth once weekly, to be given on the days of Velcade injection.  Cyclophosphamide (~300 mg/m2 oral), pulse dexamethasone (209mral), and Velcade (1.5 mg/m2 subQ) will be administered once weekly for 3 weeks on, 1 week off, then repeated  Patient will take cyclophosphamide without regard to food. Patient instructed to take cyclophosphamide early in the day and to maintain adequate hydration to prevent bladder toxicity.  Cyclophosphamide start date: 06/24/18  Adverse effects include but are not limited to: decreased blood counts, nausea, vomiting, diarrhea, hair loss, and hemorrhagic cystitis.    Reviewed with patient importance of keeping a medication schedule and plan for any missed doses.  Mrs. KeeSizemoreiced understanding and appreciation.   All questions answered. Medication reconciliation performed and medication/allergy list updated.  Insurance authorization has been submitted. We will follow-up with the patient for medication acquisition once insurance authorization is obtained.  Patient knows to call the office with questions or concerns. Oral Oncology Clinic will continue to follow.  JesJohny DrillingharmD, BCPS, BCOP  06/16/2018 2:21 PM Oral Oncology Clinic [phone]

## 2018-06-17 ENCOUNTER — Telehealth: Payer: Self-pay

## 2018-06-17 MED FILL — CYCLOPHOSPHAMIDE 50 MG CAP: 50 | 28 days supply | Qty: 30 | Fill #0

## 2018-06-17 NOTE — Telephone Encounter (Signed)
Oral Oncology Patient Advocate Encounter  Confirmed with [facility] that Cyclophosphamide was shipped 06/17/18.  [place] Patient [place] Phone [phone] Fax [phone]

## 2018-06-17 NOTE — Telephone Encounter (Signed)
Oral Oncology Patient Advocate Encounter  I was able to secure a [place] for $10000 to cover the $82.47 copay.  Abigail [name] information is as follows and has been shared with [place]. Approved 05/18/18-05/18/19  BIN: [id number] PCN: [id number] Group: [id number] ID: [id number]  [place] Patient [place] Phone [phone] Fax [phone]

## 2018-06-17 NOTE — Telephone Encounter (Signed)
Oral Oncology Patient Advocate Encounter  Received notification from [facility] that the request for prior authorization for Cyclophosphamide has been denied due to Part B covering this.    I ran the prescription under Part B and it said it needs to be ran under Part D. I billed part D and her copay is $82.47.   I will work with the patient to get copay assistance.   [place] Patient [place] Phone [phone] Fax [phone]

## 2018-06-21 ENCOUNTER — Other Ambulatory Visit: Payer: Self-pay | Admitting: Oncology

## 2018-06-24 ENCOUNTER — Other Ambulatory Visit: Payer: Self-pay | Admitting: Nurse Practitioner

## 2018-06-24 ENCOUNTER — Inpatient Hospital Stay: Payer: Medicare HMO | Attending: Nurse Practitioner

## 2018-06-24 ENCOUNTER — Inpatient Hospital Stay: Payer: Medicare HMO

## 2018-06-24 ENCOUNTER — Other Ambulatory Visit: Payer: Self-pay

## 2018-06-24 VITALS — BP 126/60 | HR 45 | Temp 98.8°F | Resp 17 | Ht 63.0 in | Wt 176.8 lb

## 2018-06-24 DIAGNOSIS — Z5112 Encounter for antineoplastic immunotherapy: Secondary | ICD-10-CM | POA: Insufficient documentation

## 2018-06-24 DIAGNOSIS — Z86718 Personal history of other venous thrombosis and embolism: Secondary | ICD-10-CM | POA: Insufficient documentation

## 2018-06-24 DIAGNOSIS — I2510 Atherosclerotic heart disease of native coronary artery without angina pectoris: Secondary | ICD-10-CM | POA: Diagnosis not present

## 2018-06-24 DIAGNOSIS — Z79899 Other long term (current) drug therapy: Secondary | ICD-10-CM | POA: Diagnosis not present

## 2018-06-24 DIAGNOSIS — Z7901 Long term (current) use of anticoagulants: Secondary | ICD-10-CM | POA: Diagnosis not present

## 2018-06-24 DIAGNOSIS — D630 Anemia in neoplastic disease: Secondary | ICD-10-CM | POA: Diagnosis not present

## 2018-06-24 DIAGNOSIS — Z23 Encounter for immunization: Secondary | ICD-10-CM | POA: Diagnosis not present

## 2018-06-24 DIAGNOSIS — C9000 Multiple myeloma not having achieved remission: Secondary | ICD-10-CM

## 2018-06-24 DIAGNOSIS — C9002 Multiple myeloma in relapse: Secondary | ICD-10-CM

## 2018-06-24 DIAGNOSIS — I252 Old myocardial infarction: Secondary | ICD-10-CM | POA: Diagnosis not present

## 2018-06-24 LAB — CBC WITH DIFFERENTIAL (CANCER CENTER ONLY)
Basophils Absolute: 0 10*3/uL (ref 0.0–0.1)
Basophils Relative: 0 %
Eosinophils Absolute: 0.1 10*3/uL (ref 0.0–0.5)
Eosinophils Relative: 1 %
HEMATOCRIT: 31.1 % — AB (ref 34.8–46.6)
Hemoglobin: 9.7 g/dL — ABNORMAL LOW (ref 11.6–15.9)
LYMPHS PCT: 11 %
Lymphs Abs: 0.9 10*3/uL (ref 0.9–3.3)
MCH: 28.3 pg (ref 25.1–34.0)
MCHC: 31.2 g/dL — AB (ref 31.5–36.0)
MCV: 90.7 fL (ref 79.5–101.0)
MONO ABS: 0.4 10*3/uL (ref 0.1–0.9)
MONOS PCT: 5 %
NEUTROS ABS: 6.5 10*3/uL (ref 1.5–6.5)
Neutrophils Relative %: 83 %
Platelet Count: 225 10*3/uL (ref 145–400)
RBC: 3.43 MIL/uL — ABNORMAL LOW (ref 3.70–5.45)
RDW: 17.5 % — AB (ref 11.2–14.5)
WBC Count: 7.9 10*3/uL (ref 3.9–10.3)

## 2018-06-24 LAB — CMP (CANCER CENTER ONLY)
ALT: 12 U/L (ref 0–44)
ANION GAP: 7 (ref 5–15)
AST: 19 U/L (ref 15–41)
Albumin: 3.6 g/dL (ref 3.5–5.0)
Alkaline Phosphatase: 107 U/L (ref 38–126)
BILIRUBIN TOTAL: 0.6 mg/dL (ref 0.3–1.2)
BUN: 22 mg/dL (ref 8–23)
CO2: 19 mmol/L — ABNORMAL LOW (ref 22–32)
Calcium: 9.6 mg/dL (ref 8.9–10.3)
Chloride: 111 mmol/L (ref 98–111)
Creatinine: 1.56 mg/dL — ABNORMAL HIGH (ref 0.44–1.00)
GFR, EST AFRICAN AMERICAN: 37 mL/min — AB (ref >60)
GFR, Estimated: 32 mL/min — ABNORMAL LOW (ref >60)
Glucose, Bld: 113 mg/dL — ABNORMAL HIGH (ref 70–99)
POTASSIUM: 4.3 mmol/L (ref 3.5–5.1)
Sodium: 137 mmol/L (ref 135–145)
TOTAL PROTEIN: 9.6 g/dL — AB (ref 6.5–8.1)

## 2018-06-24 MED ORDER — BORTEZOMIB CHEMO SQ INJECTION 3.5 MG (2.5MG/ML)
1.5000 mg/m2 | Freq: Once | INTRAMUSCULAR | Status: AC
  Administered 2018-06-24: 2.75 mg via SUBCUTANEOUS
  Filled 2018-06-24: qty 1.1

## 2018-06-24 MED ORDER — POTASSIUM CHLORIDE CRYS ER 20 MEQ PO TBCR: EXTENDED_RELEASE_TABLET | ORAL | 1 refills | Status: DC

## 2018-06-24 MED ORDER — ONDANSETRON HCL 8 MG PO TABS
ORAL_TABLET | ORAL | Status: AC
  Filled 2018-06-24: qty 1

## 2018-06-24 MED ORDER — ONDANSETRON HCL 8 MG PO TABS
8.0000 mg | ORAL_TABLET | Freq: Once | ORAL | Status: AC
  Administered 2018-06-24: 8 mg via ORAL

## 2018-06-24 NOTE — Progress Notes (Signed)
Per Dr. Benay Spice: OK to treat with Creatinine of 1.56 and other lab work from today

## 2018-06-24 NOTE — Patient Instructions (Signed)
[facility] Discharge Instructions for Patients Receiving Chemotherapy  Today you received the following chemotherapy agents: Bortezomib (Velcade)  To help prevent nausea and vomiting after your treatment, we encourage you to take your nausea medication as directed.    If you develop nausea and vomiting that is not controlled by your nausea medication, call the clinic.   BELOW ARE SYMPTOMS THAT SHOULD BE REPORTED IMMEDIATELY:  *FEVER GREATER THAN 100.5 F  *CHILLS WITH OR WITHOUT FEVER  NAUSEA AND VOMITING THAT IS NOT CONTROLLED WITH YOUR NAUSEA MEDICATION  *UNUSUAL SHORTNESS OF BREATH  *UNUSUAL BRUISING OR BLEEDING  TENDERNESS IN MOUTH AND THROAT WITH OR WITHOUT PRESENCE OF ULCERS  *URINARY PROBLEMS  *BOWEL PROBLEMS  UNUSUAL RASH Items with * indicate a potential emergency and should be followed up as soon as possible.  Feel free to call the clinic should you have any questions or concerns. The clinic phone number is [phone].  Please show the CHEMO ALERT CARD at check-in to the Emergency Department and triage nurse.   

## 2018-07-01 ENCOUNTER — Inpatient Hospital Stay: Payer: Medicare HMO

## 2018-07-01 VITALS — BP 162/82 | HR 71 | Temp 98.3°F | Resp 18 | Wt 184.0 lb

## 2018-07-01 DIAGNOSIS — C9002 Multiple myeloma in relapse: Secondary | ICD-10-CM

## 2018-07-01 DIAGNOSIS — Z5112 Encounter for antineoplastic immunotherapy: Secondary | ICD-10-CM | POA: Diagnosis not present

## 2018-07-01 DIAGNOSIS — C9000 Multiple myeloma not having achieved remission: Secondary | ICD-10-CM

## 2018-07-01 LAB — CBC WITH DIFFERENTIAL (CANCER CENTER ONLY)
Basophils Absolute: 0 10*3/uL (ref 0.0–0.1)
Basophils Relative: 0 %
Eosinophils Absolute: 0.1 10*3/uL (ref 0.0–0.5)
Eosinophils Relative: 1 %
HEMATOCRIT: 29.7 % — AB (ref 34.8–46.6)
HEMOGLOBIN: 9.2 g/dL — AB (ref 11.6–15.9)
LYMPHS ABS: 0.7 10*3/uL — AB (ref 0.9–3.3)
Lymphocytes Relative: 11 %
MCH: 28.6 pg (ref 25.1–34.0)
MCHC: 31 g/dL — AB (ref 31.5–36.0)
MCV: 92.2 fL (ref 79.5–101.0)
MONOS PCT: 7 %
Monocytes Absolute: 0.4 10*3/uL (ref 0.1–0.9)
NEUTROS ABS: 5.5 10*3/uL (ref 1.5–6.5)
NEUTROS PCT: 81 %
Platelet Count: 189 10*3/uL (ref 145–400)
RBC: 3.22 MIL/uL — ABNORMAL LOW (ref 3.70–5.45)
RDW: 17.7 % — ABNORMAL HIGH (ref 11.2–14.5)
WBC Count: 6.8 10*3/uL (ref 3.9–10.3)

## 2018-07-01 LAB — CMP (CANCER CENTER ONLY)
ALK PHOS: 110 U/L (ref 38–126)
ALT: 12 U/L (ref 0–44)
AST: 19 U/L (ref 15–41)
Albumin: 3.3 g/dL — ABNORMAL LOW (ref 3.5–5.0)
Anion gap: 7 (ref 5–15)
BILIRUBIN TOTAL: 0.4 mg/dL (ref 0.3–1.2)
BUN: 18 mg/dL (ref 8–23)
CALCIUM: 8.9 mg/dL (ref 8.9–10.3)
CO2: 16 mmol/L — ABNORMAL LOW (ref 22–32)
Chloride: 117 mmol/L — ABNORMAL HIGH (ref 98–111)
Creatinine: 1.3 mg/dL — ABNORMAL HIGH (ref 0.44–1.00)
GFR, EST AFRICAN AMERICAN: 47 mL/min — AB (ref >60)
GFR, Estimated: 40 mL/min — ABNORMAL LOW (ref >60)
GLUCOSE: 121 mg/dL — AB (ref 70–99)
Potassium: 4.2 mmol/L (ref 3.5–5.1)
Sodium: 140 mmol/L (ref 135–145)
TOTAL PROTEIN: 9 g/dL — AB (ref 6.5–8.1)

## 2018-07-01 MED ORDER — BORTEZOMIB CHEMO SQ INJECTION 3.5 MG (2.5MG/ML)
1.5000 mg/m2 | Freq: Once | INTRAMUSCULAR | Status: AC
  Administered 2018-07-01: 2.75 mg via SUBCUTANEOUS
  Filled 2018-07-01: qty 1.1

## 2018-07-01 MED ORDER — ONDANSETRON HCL 8 MG PO TABS
8.0000 mg | ORAL_TABLET | Freq: Once | ORAL | Status: AC
  Administered 2018-07-01: 8 mg via ORAL

## 2018-07-01 MED ORDER — ONDANSETRON HCL 8 MG PO TABS
ORAL_TABLET | ORAL | Status: AC
  Filled 2018-07-01: qty 1

## 2018-07-01 MED ORDER — SODIUM CHLORIDE 0.9 % IV SOLN
Freq: Once | INTRAVENOUS | Status: DC
  Filled 2018-07-01: qty 250

## 2018-07-01 NOTE — Patient Instructions (Signed)
[facility] Discharge Instructions for Patients Receiving Chemotherapy  Today you received the following chemotherapy agents: Bortezomib (Velcade)  To help prevent nausea and vomiting after your treatment, we encourage you to take your nausea medication as directed.    If you develop nausea and vomiting that is not controlled by your nausea medication, call the clinic.   BELOW ARE SYMPTOMS THAT SHOULD BE REPORTED IMMEDIATELY:  *FEVER GREATER THAN 100.5 F  *CHILLS WITH OR WITHOUT FEVER  NAUSEA AND VOMITING THAT IS NOT CONTROLLED WITH YOUR NAUSEA MEDICATION  *UNUSUAL SHORTNESS OF BREATH  *UNUSUAL BRUISING OR BLEEDING  TENDERNESS IN MOUTH AND THROAT WITH OR WITHOUT PRESENCE OF ULCERS  *URINARY PROBLEMS  *BOWEL PROBLEMS  UNUSUAL RASH Items with * indicate a potential emergency and should be followed up as soon as possible.  Feel free to call the clinic should you have any questions or concerns. The clinic phone number is [phone].  Please show the CHEMO ALERT CARD at check-in to the Emergency Department and triage nurse.   

## 2018-07-08 ENCOUNTER — Telehealth: Payer: Self-pay

## 2018-07-08 ENCOUNTER — Other Ambulatory Visit: Payer: Medicare HMO

## 2018-07-08 ENCOUNTER — Inpatient Hospital Stay (HOSPITAL_BASED_OUTPATIENT_CLINIC_OR_DEPARTMENT_OTHER): Payer: Medicare HMO | Admitting: Oncology

## 2018-07-08 ENCOUNTER — Inpatient Hospital Stay: Payer: Medicare HMO

## 2018-07-08 VITALS — BP 174/76 | HR 84 | Temp 99.0°F | Resp 17 | Ht 63.0 in | Wt 180.5 lb

## 2018-07-08 DIAGNOSIS — D630 Anemia in neoplastic disease: Secondary | ICD-10-CM | POA: Diagnosis not present

## 2018-07-08 DIAGNOSIS — Z86718 Personal history of other venous thrombosis and embolism: Secondary | ICD-10-CM

## 2018-07-08 DIAGNOSIS — Z5112 Encounter for antineoplastic immunotherapy: Secondary | ICD-10-CM | POA: Diagnosis not present

## 2018-07-08 DIAGNOSIS — C9000 Multiple myeloma not having achieved remission: Secondary | ICD-10-CM

## 2018-07-08 DIAGNOSIS — N183 Chronic kidney disease, stage 3 (moderate): Secondary | ICD-10-CM

## 2018-07-08 DIAGNOSIS — I2510 Atherosclerotic heart disease of native coronary artery without angina pectoris: Secondary | ICD-10-CM | POA: Diagnosis not present

## 2018-07-08 DIAGNOSIS — C9002 Multiple myeloma in relapse: Secondary | ICD-10-CM

## 2018-07-08 DIAGNOSIS — Z23 Encounter for immunization: Secondary | ICD-10-CM

## 2018-07-08 DIAGNOSIS — Z7901 Long term (current) use of anticoagulants: Secondary | ICD-10-CM

## 2018-07-08 DIAGNOSIS — I129 Hypertensive chronic kidney disease with stage 1 through stage 4 chronic kidney disease, or unspecified chronic kidney disease: Secondary | ICD-10-CM

## 2018-07-08 LAB — CMP (CANCER CENTER ONLY)
ALBUMIN: 3.5 g/dL (ref 3.5–5.0)
ALK PHOS: 135 U/L — AB (ref 38–126)
ALT: 9 U/L (ref 0–44)
AST: 15 U/L (ref 15–41)
Anion gap: 10 (ref 5–15)
BILIRUBIN TOTAL: 0.6 mg/dL (ref 0.3–1.2)
BUN: 17 mg/dL (ref 8–23)
CO2: 19 mmol/L — ABNORMAL LOW (ref 22–32)
Calcium: 9 mg/dL (ref 8.9–10.3)
Chloride: 112 mmol/L — ABNORMAL HIGH (ref 98–111)
Creatinine: 1.38 mg/dL — ABNORMAL HIGH (ref 0.44–1.00)
GFR, Est AFR Am: 43 mL/min — ABNORMAL LOW (ref >60)
GFR, Estimated: 37 mL/min — ABNORMAL LOW (ref >60)
GLUCOSE: 98 mg/dL (ref 70–99)
Potassium: 4.1 mmol/L (ref 3.5–5.1)
Sodium: 141 mmol/L (ref 135–145)
TOTAL PROTEIN: 9.3 g/dL — AB (ref 6.5–8.1)

## 2018-07-08 LAB — CBC WITH DIFFERENTIAL (CANCER CENTER ONLY)
BASOS ABS: 0.1 10*3/uL (ref 0.0–0.1)
Basophils Relative: 1 %
EOS ABS: 0.1 10*3/uL (ref 0.0–0.5)
EOS PCT: 1 %
HCT: 30.7 % — ABNORMAL LOW (ref 34.8–46.6)
Hemoglobin: 10 g/dL — ABNORMAL LOW (ref 11.6–15.9)
Lymphocytes Relative: 8 %
Lymphs Abs: 0.6 10*3/uL — ABNORMAL LOW (ref 0.9–3.3)
MCH: 29 pg (ref 25.1–34.0)
MCHC: 32.6 g/dL (ref 31.5–36.0)
MCV: 88.8 fL (ref 79.5–101.0)
MONO ABS: 0.5 10*3/uL (ref 0.1–0.9)
Monocytes Relative: 6 %
Neutro Abs: 6.3 10*3/uL (ref 1.5–6.5)
Neutrophils Relative %: 84 %
PLATELETS: 216 10*3/uL (ref 145–400)
RBC: 3.46 MIL/uL — ABNORMAL LOW (ref 3.70–5.45)
RDW: 18.4 % — AB (ref 11.2–14.5)
WBC Count: 7.6 10*3/uL (ref 3.9–10.3)

## 2018-07-08 MED ORDER — CYCLOPHOSPHAMIDE 50 MG PO CAPS: ORAL_CAPSULE | ORAL | 0 refills | Status: DC

## 2018-07-08 MED ORDER — INFLUENZA VAC SPLIT QUAD 0.5 ML IM SUSY
0.5000 mL | PREFILLED_SYRINGE | Freq: Once | INTRAMUSCULAR | Status: AC
  Administered 2018-07-08: 0.5 mL via INTRAMUSCULAR

## 2018-07-08 MED ORDER — BORTEZOMIB CHEMO SQ INJECTION 3.5 MG (2.5MG/ML)
1.5000 mg/m2 | Freq: Once | INTRAMUSCULAR | Status: AC
  Administered 2018-07-08: 2.75 mg via SUBCUTANEOUS
  Filled 2018-07-08: qty 1.1

## 2018-07-08 MED ORDER — INFLUENZA VAC SPLIT QUAD 0.5 ML IM SUSY
PREFILLED_SYRINGE | INTRAMUSCULAR | Status: AC
  Filled 2018-07-08: qty 0.5

## 2018-07-08 MED ORDER — ZOLEDRONIC ACID 4 MG/5ML IV CONC
3.5000 mg | Freq: Once | INTRAVENOUS | Status: DC
  Filled 2018-07-08: qty 4.38

## 2018-07-08 MED ORDER — ONDANSETRON HCL 8 MG PO TABS
ORAL_TABLET | ORAL | Status: AC
  Filled 2018-07-08: qty 1

## 2018-07-08 MED ORDER — ONDANSETRON HCL 8 MG PO TABS
8.0000 mg | ORAL_TABLET | Freq: Once | ORAL | Status: AC
  Administered 2018-07-08: 8 mg via ORAL

## 2018-07-08 MED ORDER — DEXAMETHASONE 4 MG PO TABS: ORAL_TABLET | ORAL | 1 refills | Status: DC

## 2018-07-08 NOTE — Telephone Encounter (Signed)
Printed avs and calender of upcoming appointment.per 9/19 los 

## 2018-07-08 NOTE — Addendum Note (Signed)
Addended by: Hughie Closs on: 07/08/2018 10:12 AM   Modules accepted: Orders

## 2018-07-08 NOTE — Patient Instructions (Signed)
[facility] Discharge Instructions for Patients Receiving Chemotherapy  Today you received the following chemotherapy agents: Bortezomib (Velcade)  To help prevent nausea and vomiting after your treatment, we encourage you to take your nausea medication as directed.    If you develop nausea and vomiting that is not controlled by your nausea medication, call the clinic.   BELOW ARE SYMPTOMS THAT SHOULD BE REPORTED IMMEDIATELY:  *FEVER GREATER THAN 100.5 F  *CHILLS WITH OR WITHOUT FEVER  NAUSEA AND VOMITING THAT IS NOT CONTROLLED WITH YOUR NAUSEA MEDICATION  *UNUSUAL SHORTNESS OF BREATH  *UNUSUAL BRUISING OR BLEEDING  TENDERNESS IN MOUTH AND THROAT WITH OR WITHOUT PRESENCE OF ULCERS  *URINARY PROBLEMS  *BOWEL PROBLEMS  UNUSUAL RASH Items with * indicate a potential emergency and should be followed up as soon as possible.  Feel free to call the clinic should you have any questions or concerns. The clinic phone number is [phone].  Please show the CHEMO ALERT CARD at check-in to the Emergency Department and triage nurse.   

## 2018-07-08 NOTE — Progress Notes (Signed)
[facility] OFFICE PROGRESS NOTE  Diagnosis: Multiple myeloma  INTERVAL HISTORY:   Abigail [name] began cycle 1 Cytoxan/Velcade/Decadron on 06/24/2018.  She reports tolerating the treatment well.  She has diarrhea on the day following Velcade.  No further diarrhea.  No nausea or vomiting.  No mouth sores.  Objective:  Vital signs in last 24 hours:  Blood pressure (!) 174/76, pulse 84, temperature 99 F (37.2 C), temperature source Oral, resp. rate 17, height '5\' 3"'  (1.6 m), weight 180 lb 8 oz (81.9 kg), SpO2 100 %.    HEENT: No buccal thrush or ulcers, white coat over the tongue Resp: Lungs clear bilaterally Cardio: Regular rate and rhythm with an occasional pause GI: Soft, nontender, no hepatosplenomegaly Vascular: Leg edema Musculoskeletal: Area of nodular induration at the upper right ankle is without erythema    Lab Results:  Lab Results  Component Value Date   WBC 7.6 07/08/2018   HGB 10.0 (L) 07/08/2018   HCT 30.7 (L) 07/08/2018   MCV 88.8 07/08/2018   PLT 216 07/08/2018   NEUTROABS 6.3 07/08/2018    CMP  Lab Results  Component Value Date   NA 141 07/08/2018   K 4.1 07/08/2018   CL 112 (H) 07/08/2018   CO2 19 (L) 07/08/2018   GLUCOSE 98 07/08/2018   BUN 17 07/08/2018   CREATININE 1.38 (H) 07/08/2018   CALCIUM 9.0 07/08/2018   PROT 9.3 (H) 07/08/2018   ALBUMIN 3.5 07/08/2018   AST 15 07/08/2018   ALT 9 07/08/2018   ALKPHOS 135 (H) 07/08/2018   BILITOT 0.6 07/08/2018   GFRNONAA 37 (L) 07/08/2018   GFRAA 43 (L) 07/08/2018    No results found for: CEA1  Lab Results  Component Value Date   INR 1.09 12/15/2017    Imaging:  No results found.  Medications: I have reviewed the patient's current medications.   Assessment/Plan: 1. Multiple myeloma, IgG lambda, bone marrow biopsy 06/15/2016 confirmed multiple myeloma; cytogenetics by FISHshow +11, +12, 13q-  Elevated serum free lambda light chains  Lambda light chain  proteinuria  Lytic bone lesions on a bone survey 06/13/2016  Initiation of weekly Velcade/Decadron 06/19/2016  Serum light chains improved 07/31/2016  Initiation of Revlimid 11/03/20172 weeks on/2 weeks off  Serum M spike and IgG significantly improved 09/25/2016  Velcade held on 10/30/2016 due to urinary retention  Treatment resumed with Revlimid (2 weeks on/2 weeks off) and weekly Decadron on 02/12/2017, treatment placed on hold May 2018 secondary to admissions with cardiac disease   05/27/2018-increased serum M spike, IgG, serum lambda light chains  Bone survey 05/31/2018- widespread myeloma without detectable progression since 06/13/2016.  Cycle 1 Cytoxan/Velcade/Decadron 06/24/2018 2. Severe anemia secondary to #1-markedly improved  3. Diffuse lytic bone lesions secondary to multiple myeloma, status post Zometa 09/18/2016(plan to continue every 3 months);most recent Zometa infusion 10/08/2017  4. History of coronary artery disease/myocardial infarction  5. History of colon polyps  6. Bilateral leg edema 09/04/2016. Negative bilateral venous Doppler 09/05/2016.  7. Mild periorbital edema 09/18/2016, question related to early stye formation, question Velcade related chalazia.Improved.  8. CT scan 10/27/2016 with a new right kidney massevaluated by urology; MRI abdomen 11/18/2016 multiple bilateral renal cysts. Most of these are simple cysts. The 2 lesions in question on the CT scan are both hemorrhagic or proteinaceous cysts. No worrisome contrast enhancement.  9. Urinary retention 10/28/2016 status post evaluation in the emergency department. Urinalysis negative for signs of infection 10/30/2016. Velcade held. Resolved.  10. Abdominal  pain, nausea, vomiting.Etiology unclear. Upper endoscopy 11/18/2016 with findings of reflux esophagitis, benign appearing esophageal stenosis, small hiatal hernia and erythematous duodenopathy. CT abdomen/pelvis 11/19/2016 with no acute  abnormality.  11. 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 Left lower extremity DVT May 2018.Maintainedon Coumadin.Coumadin discontinued October 2018 secondary to a supratherapeutic PT/INR and GI bleeding  13.Admission with GI bleeding October and November 2018-felt secondary to diverticular bleeding, initially while on supratherapeutic Coumadin, colonoscopy 09/04/2017-no source of blood loss identified, superficial tear at the posterior anal mucosa without stigmata of bleeding     Disposition: Abigail [name] appears stable.  She will complete day 15 of cycle 1 Cytoxan/Velcade/Decadron today.  She will return for an office visit prior to beginning cycle 2 on 07/22/2018.  She will receive an influenza vaccine today.  We will obtain a myeloma panel when she returns on 07/22/2018.  [name], MD  07/08/2018  9:40 AM

## 2018-07-18 ENCOUNTER — Other Ambulatory Visit: Payer: Self-pay | Admitting: Oncology

## 2018-07-20 ENCOUNTER — Ambulatory Visit
Admission: RE | Admit: 2018-07-20 | Discharge: 2018-07-20 | Disposition: A | Discharge status: Home / Self Care | Payer: Medicare HMO | Source: Ambulatory Visit | Attending: Internal Medicine | Admitting: Internal Medicine

## 2018-07-20 DIAGNOSIS — E2839 Other primary ovarian failure: Secondary | ICD-10-CM

## 2018-07-22 ENCOUNTER — Inpatient Hospital Stay: Payer: Medicare HMO | Attending: Nurse Practitioner

## 2018-07-22 ENCOUNTER — Inpatient Hospital Stay: Payer: Medicare HMO

## 2018-07-22 ENCOUNTER — Inpatient Hospital Stay (HOSPITAL_BASED_OUTPATIENT_CLINIC_OR_DEPARTMENT_OTHER): Payer: Medicare HMO | Admitting: Oncology

## 2018-07-22 ENCOUNTER — Telehealth: Payer: Self-pay | Admitting: Oncology

## 2018-07-22 VITALS — BP 165/67 | HR 78 | Temp 99.1°F | Resp 16 | Ht 63.0 in | Wt 184.6 lb

## 2018-07-22 DIAGNOSIS — F419 Anxiety disorder, unspecified: Secondary | ICD-10-CM | POA: Insufficient documentation

## 2018-07-22 DIAGNOSIS — N183 Chronic kidney disease, stage 3 (moderate): Secondary | ICD-10-CM | POA: Insufficient documentation

## 2018-07-22 DIAGNOSIS — Z7901 Long term (current) use of anticoagulants: Secondary | ICD-10-CM | POA: Diagnosis not present

## 2018-07-22 DIAGNOSIS — Z86718 Personal history of other venous thrombosis and embolism: Secondary | ICD-10-CM | POA: Insufficient documentation

## 2018-07-22 DIAGNOSIS — I129 Hypertensive chronic kidney disease with stage 1 through stage 4 chronic kidney disease, or unspecified chronic kidney disease: Secondary | ICD-10-CM | POA: Diagnosis not present

## 2018-07-22 DIAGNOSIS — Z86010 Personal history of colonic polyps: Secondary | ICD-10-CM | POA: Diagnosis not present

## 2018-07-22 DIAGNOSIS — I2510 Atherosclerotic heart disease of native coronary artery without angina pectoris: Secondary | ICD-10-CM | POA: Insufficient documentation

## 2018-07-22 DIAGNOSIS — D649 Anemia, unspecified: Secondary | ICD-10-CM | POA: Diagnosis not present

## 2018-07-22 DIAGNOSIS — Z79899 Other long term (current) drug therapy: Secondary | ICD-10-CM | POA: Insufficient documentation

## 2018-07-22 DIAGNOSIS — C9002 Multiple myeloma in relapse: Secondary | ICD-10-CM

## 2018-07-22 DIAGNOSIS — F329 Major depressive disorder, single episode, unspecified: Secondary | ICD-10-CM | POA: Diagnosis not present

## 2018-07-22 DIAGNOSIS — C9000 Multiple myeloma not having achieved remission: Secondary | ICD-10-CM | POA: Insufficient documentation

## 2018-07-22 DIAGNOSIS — Z5112 Encounter for antineoplastic immunotherapy: Secondary | ICD-10-CM | POA: Diagnosis not present

## 2018-07-22 DIAGNOSIS — Z8679 Personal history of other diseases of the circulatory system: Secondary | ICD-10-CM

## 2018-07-22 LAB — CMP (CANCER CENTER ONLY)
ALBUMIN: 3.6 g/dL (ref 3.5–5.0)
ALT: 8 U/L (ref 0–44)
AST: 17 U/L (ref 15–41)
Alkaline Phosphatase: 118 U/L (ref 38–126)
Anion gap: 9 (ref 5–15)
BUN: 19 mg/dL (ref 8–23)
CHLORIDE: 115 mmol/L — AB (ref 98–111)
CO2: 16 mmol/L — ABNORMAL LOW (ref 22–32)
CREATININE: 1.24 mg/dL — AB (ref 0.44–1.00)
Calcium: 9.4 mg/dL (ref 8.9–10.3)
GFR, EST NON AFRICAN AMERICAN: 43 mL/min — AB (ref >60)
GFR, Est AFR Am: 49 mL/min — ABNORMAL LOW (ref >60)
GLUCOSE: 102 mg/dL — AB (ref 70–99)
POTASSIUM: 4 mmol/L (ref 3.5–5.1)
Sodium: 140 mmol/L (ref 135–145)
Total Bilirubin: 0.5 mg/dL (ref 0.3–1.2)
Total Protein: 9.1 g/dL — ABNORMAL HIGH (ref 6.5–8.1)

## 2018-07-22 LAB — CBC WITH DIFFERENTIAL (CANCER CENTER ONLY)
BASOS ABS: 0 10*3/uL (ref 0.0–0.1)
BASOS PCT: 1 %
Eosinophils Absolute: 0.1 10*3/uL (ref 0.0–0.5)
Eosinophils Relative: 1 %
HEMATOCRIT: 29.9 % — AB (ref 34.8–46.6)
HEMOGLOBIN: 9.8 g/dL — AB (ref 11.6–15.9)
LYMPHS PCT: 7 %
Lymphs Abs: 0.4 10*3/uL — ABNORMAL LOW (ref 0.9–3.3)
MCH: 29.3 pg (ref 25.1–34.0)
MCHC: 32.7 g/dL (ref 31.5–36.0)
MCV: 89.8 fL (ref 79.5–101.0)
MONO ABS: 0.4 10*3/uL (ref 0.1–0.9)
MONOS PCT: 6 %
NEUTROS ABS: 5.4 10*3/uL (ref 1.5–6.5)
NEUTROS PCT: 85 %
Platelet Count: 278 10*3/uL (ref 145–400)
RBC: 3.33 MIL/uL — ABNORMAL LOW (ref 3.70–5.45)
RDW: 19.2 % — AB (ref 11.2–14.5)
WBC Count: 6.3 10*3/uL (ref 3.9–10.3)

## 2018-07-22 MED ORDER — SODIUM CHLORIDE 0.9 % IV SOLN
INTRAVENOUS | Status: DC
  Administered 2018-07-22: 10:00:00 via INTRAVENOUS
  Filled 2018-07-22: qty 250

## 2018-07-22 MED ORDER — BORTEZOMIB CHEMO SQ INJECTION 3.5 MG (2.5MG/ML)
1.5000 mg/m2 | Freq: Once | INTRAMUSCULAR | Status: AC
  Administered 2018-07-22: 2.75 mg via SUBCUTANEOUS
  Filled 2018-07-22: qty 1.1

## 2018-07-22 MED ORDER — ONDANSETRON HCL 8 MG PO TABS
8.0000 mg | ORAL_TABLET | Freq: Once | ORAL | Status: AC
  Administered 2018-07-22: 8 mg via ORAL

## 2018-07-22 MED ORDER — ZOLEDRONIC ACID 4 MG/5ML IV CONC
3.5000 mg | Freq: Once | INTRAVENOUS | Status: AC
  Administered 2018-07-22: 3.5 mg via INTRAVENOUS
  Filled 2018-07-22: qty 4.38

## 2018-07-22 MED ORDER — ONDANSETRON HCL 8 MG PO TABS
ORAL_TABLET | ORAL | Status: AC
  Filled 2018-07-22: qty 1

## 2018-07-22 NOTE — Patient Instructions (Addendum)
[facility] Discharge Instructions for Patients Receiving Chemotherapy  Today you received the following chemotherapy agents Velcade, Zometa.  To help prevent nausea and vomiting after your treatment, we encourage you to take your nausea medication as directed   If you develop nausea and vomiting that is not controlled by your nausea medication, call the clinic.   BELOW ARE SYMPTOMS THAT SHOULD BE REPORTED IMMEDIATELY:  *FEVER GREATER THAN 100.5 F  *CHILLS WITH OR WITHOUT FEVER  NAUSEA AND VOMITING THAT IS NOT CONTROLLED WITH YOUR NAUSEA MEDICATION  *UNUSUAL SHORTNESS OF BREATH  *UNUSUAL BRUISING OR BLEEDING  TENDERNESS IN MOUTH AND THROAT WITH OR WITHOUT PRESENCE OF ULCERS  *URINARY PROBLEMS  *BOWEL PROBLEMS  UNUSUAL RASH Items with * indicate a potential emergency and should be followed up as soon as possible.  Feel free to call the clinic should you have any questions or concerns. The clinic phone number is [phone].  Please show the CHEMO ALERT CARD at check-in to the Emergency Department and triage nurse.   

## 2018-07-22 NOTE — Telephone Encounter (Signed)
Scheduled appt per 10/3 los - left message for patient with appt date and time   

## 2018-07-22 NOTE — Progress Notes (Signed)
[place] OFFICE PROGRESS NOTE   Diagnosis: Multiple myeloma  INTERVAL HISTORY:   Ms. Diclemente returns as scheduled.  She completed a first cycle of salvage therapy 07/08/2018.  No nausea or mouth sores.  She has diarrhea on day 2.  She thinks this may be related to Cytoxan.  The diarrhea resolves after taking Imodium.  Objective:  Vital signs in last 24 hours:  Blood pressure (!) 165/67, pulse 78, temperature 99.1 F (37.3 C), temperature source Oral, resp. rate 16, height 5' 3" (1.6 m), weight 184 lb 9.6 oz (83.7 kg), SpO2 100 %.    HEENT: No thrush or ulcers Resp: Lungs clear bilaterally Cardio: Regular rate and rhythm GI: Nontender, no hepatosplenomegaly Vascular: No leg edema   Lab Results:  Lab Results  Component Value Date   WBC 6.3 07/22/2018   HGB 9.8 (L) 07/22/2018   HCT 29.9 (L) 07/22/2018   MCV 89.8 07/22/2018   PLT 278 07/22/2018   NEUTROABS 5.4 07/22/2018    CMP  Lab Results  Component Value Date   NA 141 07/08/2018   K 4.1 07/08/2018   CL 112 (H) 07/08/2018   CO2 19 (L) 07/08/2018   GLUCOSE 98 07/08/2018   BUN 17 07/08/2018   CREATININE 1.38 (H) 07/08/2018   CALCIUM 9.0 07/08/2018   PROT 9.3 (H) 07/08/2018   ALBUMIN 3.5 07/08/2018   AST 15 07/08/2018   ALT 9 07/08/2018   ALKPHOS 135 (H) 07/08/2018   BILITOT 0.6 07/08/2018   GFRNONAA 37 (L) 07/08/2018   GFRAA 43 (L) 07/08/2018    Imaging:  Dg Bone Density (dxa)  Result Date: 07/20/2018 EXAM: DUAL X-RAY ABSORPTIOMETRY (DXA) FOR BONE MINERAL DENSITY IMPRESSION: Referring Physician:  Nolene Ebbs Your patient completed a BMD test using Lunar IDXA DXA system ( analysis version: 16 ) manufactured by [employer]. Technologist: WLS PATIENT: Name: Javaeh, Muscatello Patient ID: [id number] Birth Date: 10-13-1946 Height: 63.0 in. Sex: Female Measured: 07/20/2018 Weight: 183.5 lbs. Indications: Multiple myeloma. Advanced Age, Bilateral Ovariectomy (65.51), Estrogen Deficient, Family Hist.  (Parent hip fracture), Family History of Osteoporosis, Height Loss (781.91), Hysterectomy, Postmenopausal, Tobacco User (Current Smoker), Unilateral Ovariectomy Fractures: None Treatments: Calcium (E943.0), Vitamin D (E933.5), zometa ASSESSMENT: The BMD measured at Femur Total Right is 0.843 g/cm2 with a T-score of -1.3. This patient is considered osteopenic according to [facility]) criteria. There has been a statistically significant decrease in BMD of Total mean hips since prior exam dated 10/02/2015. The scan quality is limited by patient limited mobility. Lumbar spine was not utilized due to advanced degenerative changes. Patient does not meet criteria for FRAX due to treatment with Zometa. Site Region Measured Date Measured Age YA BMD Significant CHANGE T-score DualFemur Total Right 07/20/2018 71.5 -1.4 0.843 g/cm2 * DualFemur Total Right 10/02/2015 68.7 -0.7 0.932 g/cm2 DualFemur Total Mean 07/20/2018 71.5 -1.1 0.865 g/cm2 * DualFemur Total Mean 10/02/2015 68.7 -0.5 0.951 g/cm2 Left Forearm Radius 33% 07/20/2018 71.5 -0.8 0.820 g/cm2 World Health Organization [facility]) criteria for post-menopausal, Caucasian Women: Normal       T-score at or above -1 SD Osteopenia   T-score between -1 and -2.5 SD Osteoporosis T-score at or below -2.5 SD RECOMMENDATION: 1. All patients should optimize calcium and vitamin D intake. 2. Consider FDA approved medical therapies in postmenopausal women and men aged 39 years and older, based on the following: a. A hip or vertebral (clinical or morphometric) fracture b. T- score < or = -2.5 at the femoral neck  or spine after appropriate evaluation to exclude secondary causes c. Low bone mass (T-score between -1.0 and -2.5 at the femoral neck or spine) and a 10 year probability of a hip fracture > or = 3% or a 10 year probability of a major osteoporosis-related fracture > or = 20% based on the [place]-adapted WHO algorithm d. Clinician judgment and/or patient preferences may  indicate treatment for people with 10-year fracture probabilities above or below these levels FOLLOW-UP: People with diagnosed cases of osteoporosis or at high risk for fracture should have regular bone mineral density tests. For patients eligible for Medicare, routine testing is allowed once every 2 years. The testing frequency can be increased to one year for patients who have rapidly progressing disease, those who are receiving or discontinuing medical therapy to restore bone mass, or have additional risk factors. I have reviewed this report and agree with the above findings. [facility] Electronically Signed   By: Earle Gell M.D.   On: 07/20/2018 10:53    Medications: I have reviewed the patient's current medications.   Assessment/Plan: 1. Multiple myeloma, IgG lambda, bone marrow biopsy 06/15/2016 confirmed multiple myeloma; cytogenetics by FISHshow +11, +12, 13q-  Elevated serum free lambda light chains  Lambda light chain proteinuria  Lytic bone lesions on a bone survey 06/13/2016  Initiation of weekly Velcade/Decadron 06/19/2016  Serum light chains improved 07/31/2016  Initiation of Revlimid 11/03/20172 weeks on/2 weeks off  Serum M spike and IgG significantly improved 09/25/2016  Velcade held on 10/30/2016 due to urinary retention  Treatment resumed with Revlimid (2 weeks on/2 weeks off) and weekly Decadron on 02/12/2017, treatment placed on hold May 2018 secondary to admissions with cardiac disease  05/27/2018-increased serum M spike, IgG, serum lambda light chains  Bone survey 05/31/2018- widespread myeloma without detectable progression since 06/13/2016.  Cycle 1 Cytoxan/Velcade/Decadron 06/24/2018  Cycle 2 Cytoxan/Velcade/Decadron 07/22/2018 2. Severe anemia secondary to #1-markedly improved  3. Diffuse lytic bone lesions secondary to multiple myeloma, status post Zometa 09/18/2016(plan to continue every 3 months);most recent Zometa infusion 10/08/2017  4.  History of coronary artery disease/myocardial infarction  5. History of colon polyps  6. Bilateral leg edema 09/04/2016. Negative bilateral venous Doppler 09/05/2016.  7. Mild periorbital edema 09/18/2016, question related to early stye formation, question Velcade related chalazia.Improved.  8. CT scan 10/27/2016 with a new right kidney massevaluated by urology; MRI abdomen 11/18/2016 multiple bilateral renal cysts. Most of these are simple cysts. The 2 lesions in question on the CT scan are both hemorrhagic or proteinaceous cysts. No worrisome contrast enhancement.  9. Urinary retention 10/28/2016 status post evaluation in the emergency department. Urinalysis negative for signs of infection 10/30/2016. Velcade held. Resolved.  10. Abdominal pain, nausea, vomiting.Etiology unclear. Upper endoscopy 11/18/2016 with findings of reflux esophagitis, benign appearing esophageal stenosis, small hiatal hernia and erythematous duodenopathy. CT abdomen/pelvis 11/19/2016 with no acute abnormality.  11. 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 Left lower extremity DVT May 2018.Maintainedon Coumadin.Coumadin discontinued October 2018 secondary to a supratherapeutic PT/INR and GI bleeding  13.Admission with GI bleeding October and November 2018-felt secondary to diverticular bleeding, initially while on supratherapeutic Coumadin, colonoscopy 09/04/2017-no source of blood loss identified, superficial tear at the posterior anal mucosa without stigmata of bleeding     Disposition: Ms. Lanyon appears unchanged.  She will begin cycle 2 Cytoxan/Velcade/Decadron today.  We will follow-up on the myeloma panel from today.  She will return for Velcade on 07/29/2018 and 08/05/2018.  She will  be scheduled for an office and lab visit on 08/19/2018.  15 minutes were spent with the patient today.  The majority of the time was used for counseling and coordination of  care.  [name], MD  07/22/2018  9:00 AM

## 2018-07-23 LAB — PROTEIN ELECTROPHORESIS, SERUM
A/G Ratio: 0.8 (ref 0.7–1.7)
Albumin ELP: 3.8 g/dL (ref 2.9–4.4)
Alpha-1-Globulin: 0.3 g/dL (ref 0.0–0.4)
Alpha-2-Globulin: 0.9 g/dL (ref 0.4–1.0)
BETA GLOBULIN: 1 g/dL (ref 0.7–1.3)
GAMMA GLOBULIN: 2.4 g/dL — AB (ref 0.4–1.8)
Globulin, Total: 4.5 g/dL — ABNORMAL HIGH (ref 2.2–3.9)
M-SPIKE, %: 2.3 g/dL — AB
Total Protein ELP: 8.3 g/dL (ref 6.0–8.5)

## 2018-07-23 LAB — KAPPA/LAMBDA LIGHT CHAINS
KAPPA, LAMDA LIGHT CHAIN RATIO: 0.08 — AB (ref 0.26–1.65)
Kappa free light chain: 8.8 mg/L (ref 3.3–19.4)
Lambda free light chains: 109.7 mg/L — ABNORMAL HIGH (ref 5.7–26.3)

## 2018-07-23 LAB — IGG: IgG (Immunoglobin G), Serum: 3091 mg/dL — ABNORMAL HIGH (ref 700–1600)

## 2018-07-28 IMAGING — CR DG ABDOMEN ACUTE W/ 1V CHEST
3 series · 3 of 3 positions shown · non-contrast
Comparison: Prior CT from 10/27/2016.

CLINICAL DATA: Initial evaluation for acute left lower quadrant
pain for 3 weeks. History of multiple myeloma.

EXAM:
DG ABDOMEN ACUTE W/ 1V CHEST

[w chest pa]
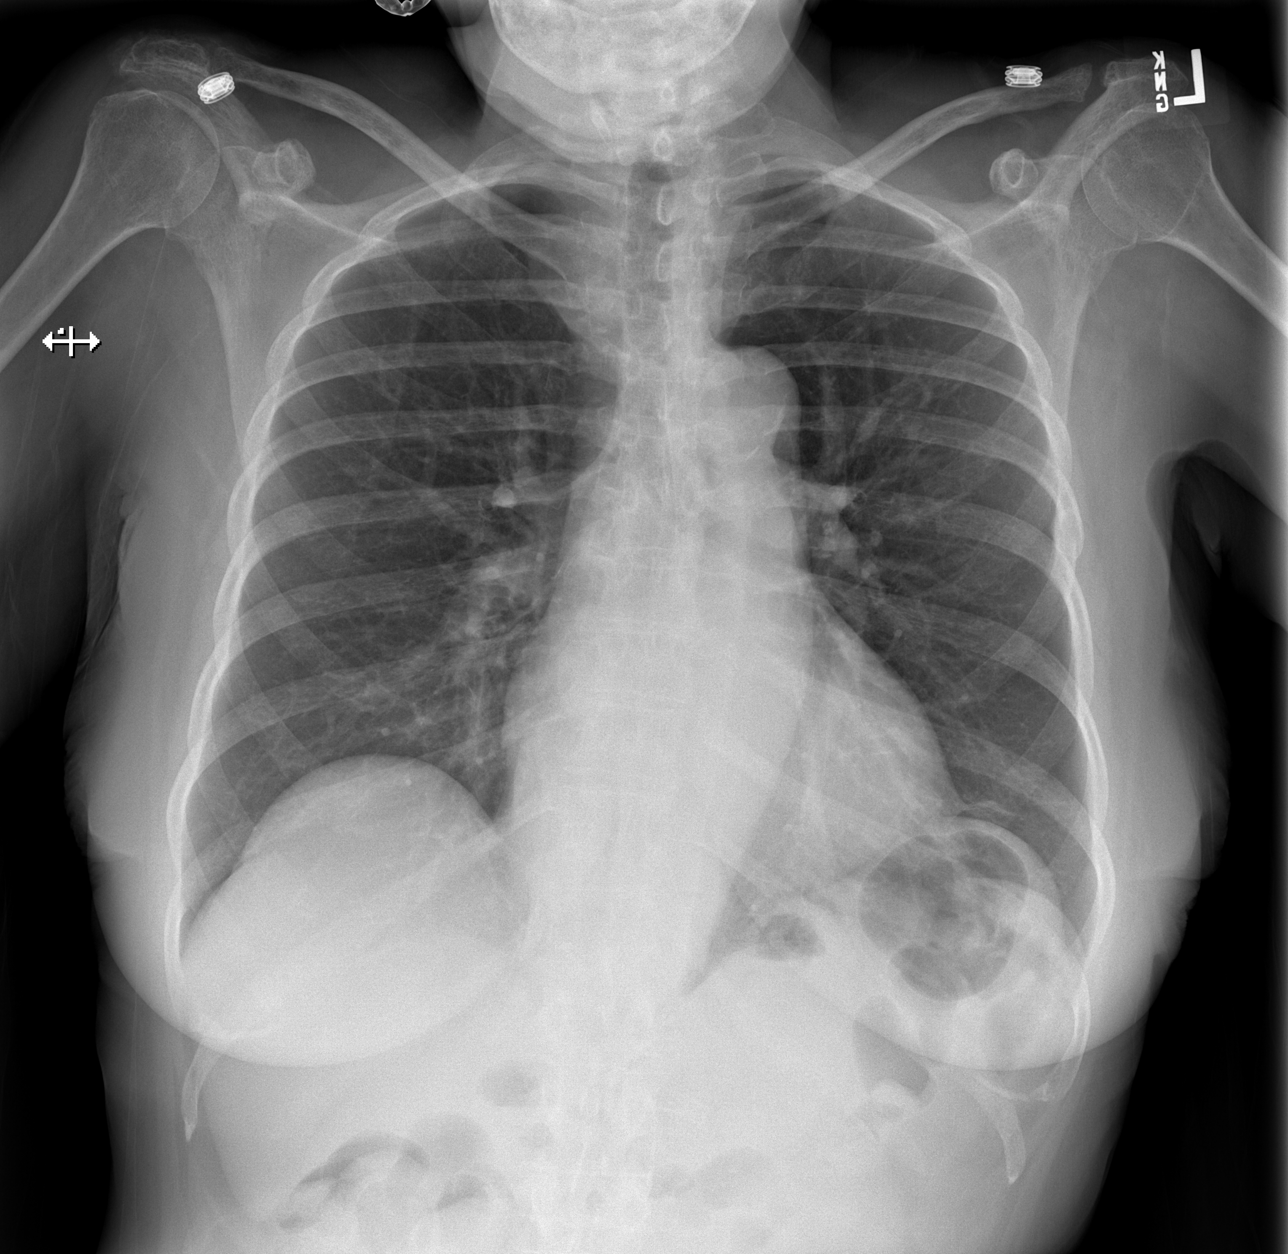

[w abdomen upright]
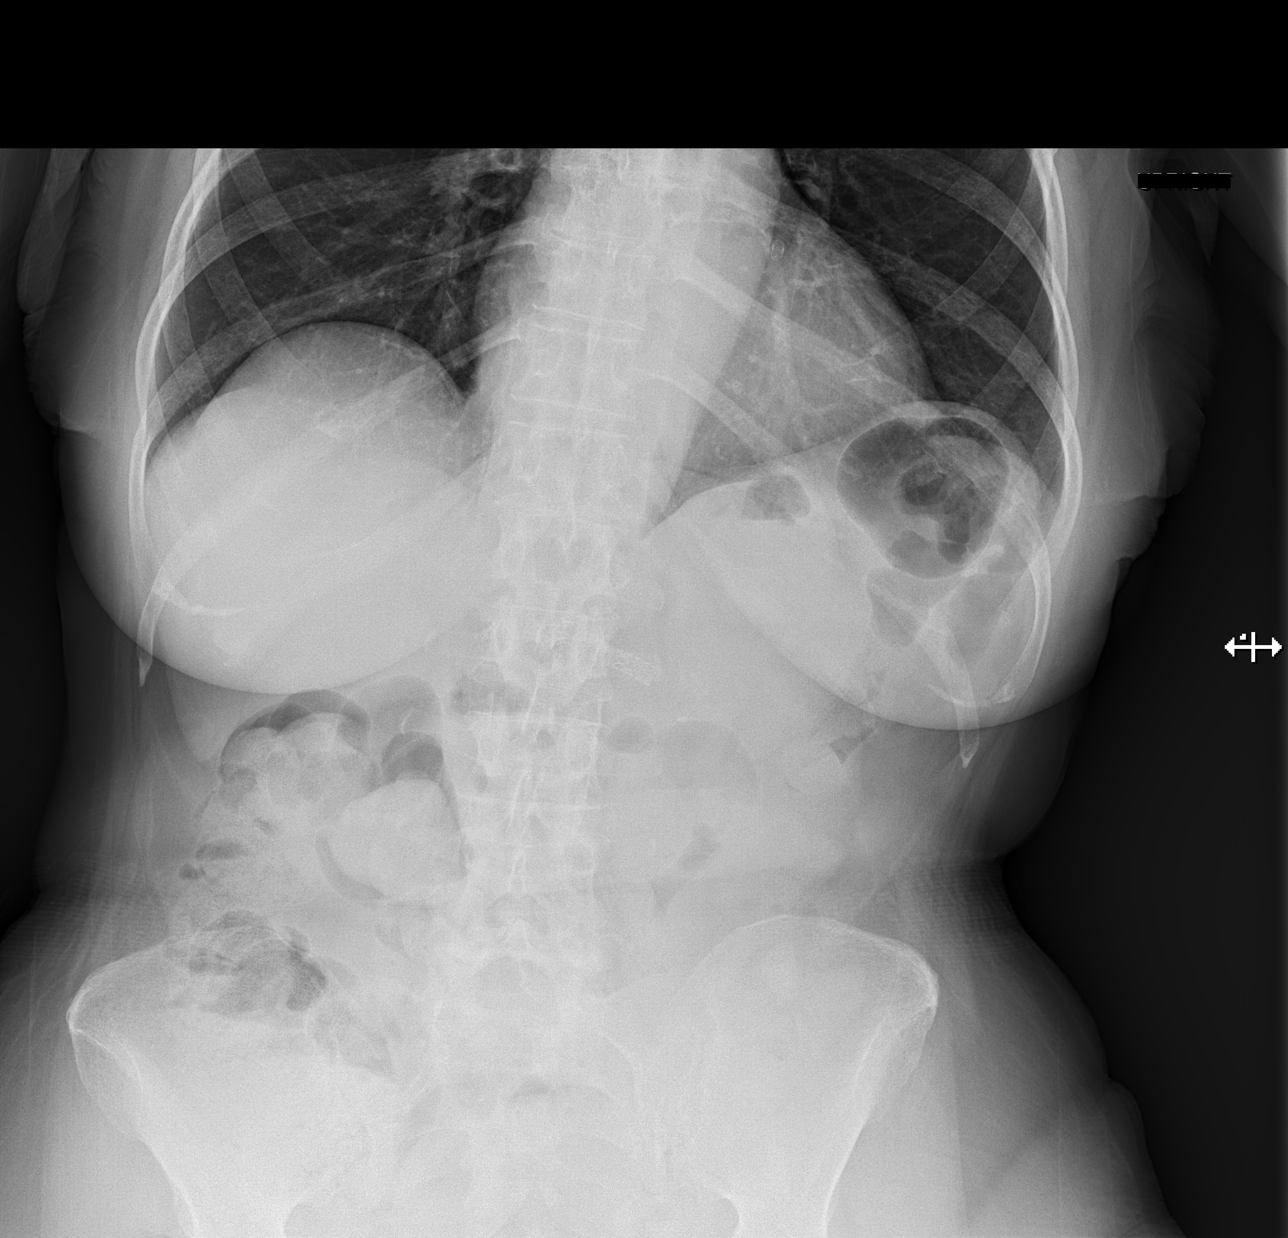

[t abdomen supine]
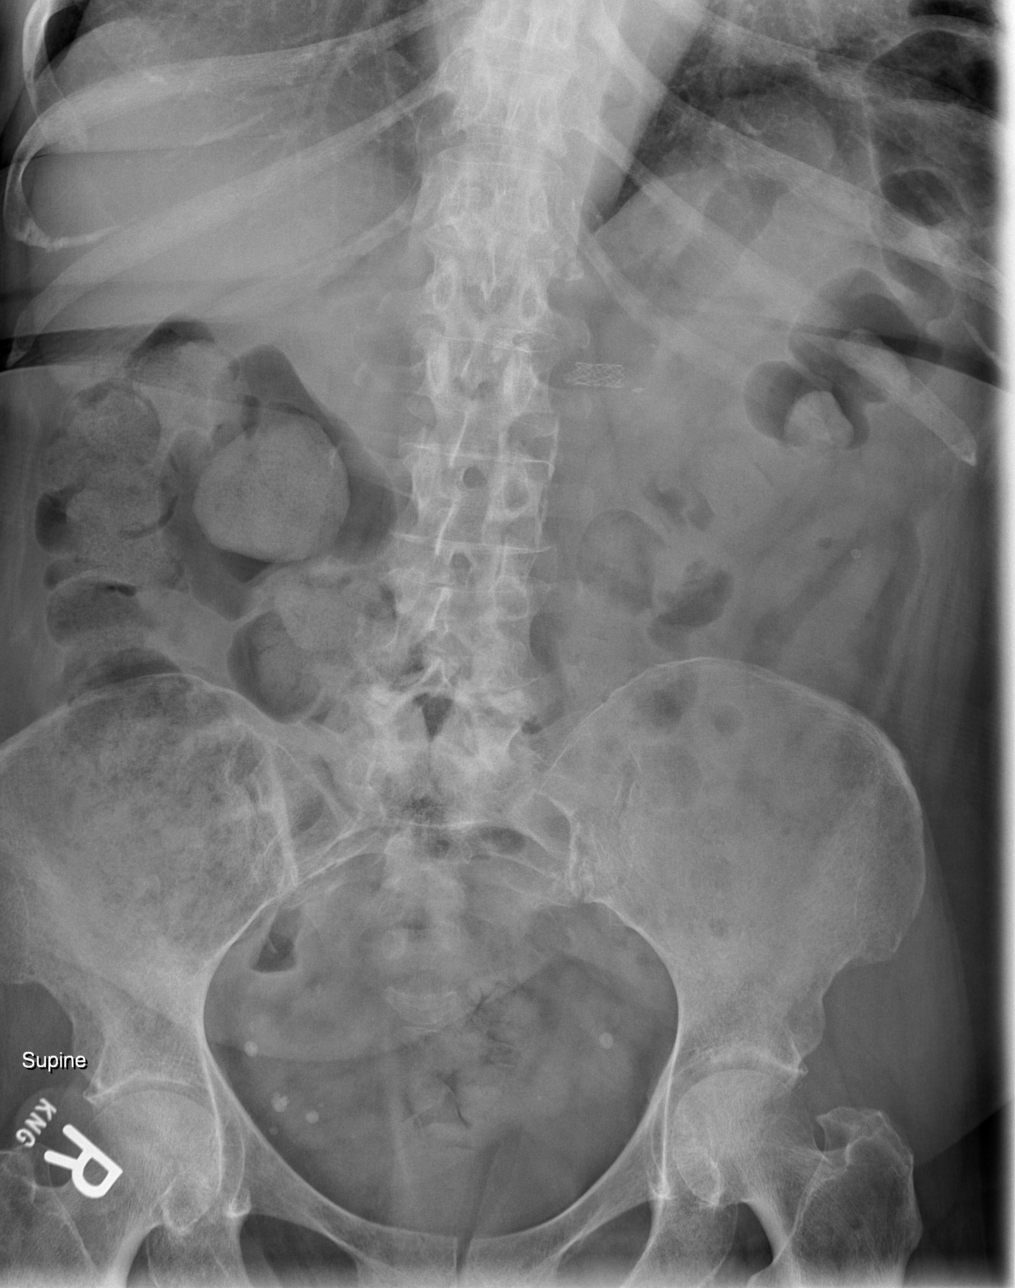

[3 of 3 positions shown; findings below may reference images not displayed]

FINDINGS: Cardiac and mediastinal silhouettes are within normal limits.
Scattered atheromatous plaque noted within the aortic arch.

Lungs normally inflated. Linear opacities of the left lung base most
compatible with atelectasis. No other focal airspace disease. No
pulmonary edema or pleural effusion. No pneumothorax.

Bowel gas pattern within normal limits without evidence for
obstruction or ileus. No abnormal bowel wall thickening. Moderate to
large amount of retained stool within the right colon. No soft
tissue mass or abnormal calcification. Vascular stent overlies the
left upper abdomen.

No acute osseous abnormality.
IMPRESSION: 1. Nonobstructive bowel gas pattern with no radiographic evidence
for acute intra-abdominal process.
2. Moderate to large amount of retained stool within the right
colon.
3. Mild left basilar atelectasis. No other active cardiopulmonary
disease.
4. Aortic atherosclerosis.

## 2018-07-29 ENCOUNTER — Inpatient Hospital Stay: Payer: Medicare HMO

## 2018-07-29 ENCOUNTER — Telehealth: Payer: Self-pay | Admitting: *Deleted

## 2018-07-29 VITALS — BP 118/48 | HR 56 | Temp 97.8°F | Resp 16 | Wt 182.0 lb

## 2018-07-29 DIAGNOSIS — C9002 Multiple myeloma in relapse: Secondary | ICD-10-CM

## 2018-07-29 DIAGNOSIS — Z5112 Encounter for antineoplastic immunotherapy: Secondary | ICD-10-CM | POA: Diagnosis not present

## 2018-07-29 DIAGNOSIS — C9000 Multiple myeloma not having achieved remission: Secondary | ICD-10-CM

## 2018-07-29 LAB — CBC WITH DIFFERENTIAL (CANCER CENTER ONLY)
ABS IMMATURE GRANULOCYTES: 0.11 10*3/uL — AB (ref 0.00–0.07)
BASOS PCT: 1 %
Basophils Absolute: 0 10*3/uL (ref 0.0–0.1)
Eosinophils Absolute: 0.1 10*3/uL (ref 0.0–0.5)
Eosinophils Relative: 2 %
HCT: 28.3 % — ABNORMAL LOW (ref 36.0–46.0)
Hemoglobin: 8.5 g/dL — ABNORMAL LOW (ref 12.0–15.0)
Immature Granulocytes: 2 %
Lymphocytes Relative: 7 %
Lymphs Abs: 0.4 10*3/uL — ABNORMAL LOW (ref 0.7–4.0)
MCH: 28.2 pg (ref 26.0–34.0)
MCHC: 30 g/dL (ref 30.0–36.0)
MCV: 94 fL (ref 80.0–100.0)
MONO ABS: 0.5 10*3/uL (ref 0.1–1.0)
Monocytes Relative: 9 %
NEUTROS ABS: 4.8 10*3/uL (ref 1.7–7.7)
Neutrophils Relative %: 79 %
PLATELETS: 165 10*3/uL (ref 150–400)
RBC: 3.01 MIL/uL — AB (ref 3.87–5.11)
RDW: 18.5 % — ABNORMAL HIGH (ref 11.5–15.5)
WBC: 6 10*3/uL (ref 4.0–10.5)
nRBC: 0 % (ref 0.0–0.2)

## 2018-07-29 MED ORDER — ONDANSETRON HCL 8 MG PO TABS
8.0000 mg | ORAL_TABLET | Freq: Once | ORAL | Status: AC
  Administered 2018-07-29: 8 mg via ORAL

## 2018-07-29 MED ORDER — ONDANSETRON HCL 8 MG PO TABS
ORAL_TABLET | ORAL | Status: AC
  Filled 2018-07-29: qty 1

## 2018-07-29 MED ORDER — BORTEZOMIB CHEMO SQ INJECTION 3.5 MG (2.5MG/ML)
1.5000 mg/m2 | Freq: Once | INTRAMUSCULAR | Status: AC
  Administered 2018-07-29: 2.75 mg via SUBCUTANEOUS
  Filled 2018-07-29: qty 1.1

## 2018-07-29 NOTE — Telephone Encounter (Signed)
Ok to treat without CMP today per Dr. Benay Spice

## 2018-07-29 NOTE — Patient Instructions (Signed)
[facility] Discharge Instructions for Patients Receiving Chemotherapy  Today you received the following chemotherapy agents Velcade.  To help prevent nausea and vomiting after your treatment, we encourage you to take your nausea medication as directed.  If you develop nausea and vomiting that is not controlled by your nausea medication, call the clinic.   BELOW ARE SYMPTOMS THAT SHOULD BE REPORTED IMMEDIATELY:  *FEVER GREATER THAN 100.5 F  *CHILLS WITH OR WITHOUT FEVER  NAUSEA AND VOMITING THAT IS NOT CONTROLLED WITH YOUR NAUSEA MEDICATION  *UNUSUAL SHORTNESS OF BREATH  *UNUSUAL BRUISING OR BLEEDING  TENDERNESS IN MOUTH AND THROAT WITH OR WITHOUT PRESENCE OF ULCERS  *URINARY PROBLEMS  *BOWEL PROBLEMS  UNUSUAL RASH Items with * indicate a potential emergency and should be followed up as soon as possible.  Feel free to call the clinic should you have any questions or concerns. The clinic phone number is [phone].  Please show the CHEMO ALERT CARD at check-in to the Emergency Department and triage nurse.   

## 2018-08-05 ENCOUNTER — Inpatient Hospital Stay: Payer: Medicare HMO

## 2018-08-05 ENCOUNTER — Encounter: Payer: Self-pay | Admitting: Emergency Medicine

## 2018-08-05 ENCOUNTER — Telehealth: Payer: Self-pay | Admitting: Emergency Medicine

## 2018-08-05 VITALS — BP 137/62 | HR 72 | Temp 98.0°F | Resp 20 | Ht 63.0 in | Wt 178.8 lb

## 2018-08-05 DIAGNOSIS — C9000 Multiple myeloma not having achieved remission: Secondary | ICD-10-CM

## 2018-08-05 DIAGNOSIS — Z5112 Encounter for antineoplastic immunotherapy: Secondary | ICD-10-CM | POA: Diagnosis not present

## 2018-08-05 DIAGNOSIS — C9002 Multiple myeloma in relapse: Secondary | ICD-10-CM

## 2018-08-05 LAB — CBC WITH DIFFERENTIAL (CANCER CENTER ONLY)
Abs Immature Granulocytes: 0.04 10*3/uL (ref 0.00–0.07)
Basophils Absolute: 0 10*3/uL (ref 0.0–0.1)
Basophils Relative: 0 %
Eosinophils Absolute: 0.1 10*3/uL (ref 0.0–0.5)
Eosinophils Relative: 1 %
HEMATOCRIT: 30.2 % — AB (ref 36.0–46.0)
HEMOGLOBIN: 9 g/dL — AB (ref 12.0–15.0)
Immature Granulocytes: 1 %
LYMPHS ABS: 0.4 10*3/uL — AB (ref 0.7–4.0)
LYMPHS PCT: 5 %
MCH: 28.7 pg (ref 26.0–34.0)
MCHC: 29.8 g/dL — AB (ref 30.0–36.0)
MCV: 96.2 fL (ref 80.0–100.0)
MONO ABS: 0.5 10*3/uL (ref 0.1–1.0)
Monocytes Relative: 8 %
Neutro Abs: 5.9 10*3/uL (ref 1.7–7.7)
Neutrophils Relative %: 85 %
Platelet Count: 211 10*3/uL (ref 150–400)
RBC: 3.14 MIL/uL — ABNORMAL LOW (ref 3.87–5.11)
RDW: 19 % — ABNORMAL HIGH (ref 11.5–15.5)
WBC: 6.9 10*3/uL (ref 4.0–10.5)
nRBC: 0 % (ref 0.0–0.2)

## 2018-08-05 LAB — COMPREHENSIVE METABOLIC PANEL
ALT: 7 U/L (ref 0–44)
ANION GAP: 11 (ref 5–15)
AST: 17 U/L (ref 15–41)
Albumin: 3.8 g/dL (ref 3.5–5.0)
Alkaline Phosphatase: 120 U/L (ref 38–126)
BILIRUBIN TOTAL: 0.5 mg/dL (ref 0.3–1.2)
BUN: 20 mg/dL (ref 8–23)
CO2: 8 mmol/L — ABNORMAL LOW (ref 22–32)
Calcium: 8.2 mg/dL — ABNORMAL LOW (ref 8.9–10.3)
Chloride: 122 mmol/L — ABNORMAL HIGH (ref 98–111)
Creatinine, Ser: 3.26 mg/dL (ref 0.44–1.00)
GFR, EST AFRICAN AMERICAN: 15 mL/min — AB (ref >60)
GFR, EST NON AFRICAN AMERICAN: 13 mL/min — AB (ref >60)
Glucose, Bld: 93 mg/dL (ref 70–99)
Potassium: 4 mmol/L (ref 3.5–5.1)
Sodium: 141 mmol/L (ref 135–145)
TOTAL PROTEIN: 9.2 g/dL — AB (ref 6.5–8.1)

## 2018-08-05 MED ORDER — ONDANSETRON HCL 8 MG PO TABS
8.0000 mg | ORAL_TABLET | Freq: Once | ORAL | Status: AC
  Administered 2018-08-05: 8 mg via ORAL

## 2018-08-05 MED ORDER — ONDANSETRON HCL 8 MG PO TABS
ORAL_TABLET | ORAL | Status: AC
  Filled 2018-08-05: qty 1

## 2018-08-05 MED ORDER — BORTEZOMIB CHEMO SQ INJECTION 3.5 MG (2.5MG/ML)
1.5000 mg/m2 | Freq: Once | INTRAMUSCULAR | Status: AC
  Administered 2018-08-05: 2.75 mg via SUBCUTANEOUS
  Filled 2018-08-05: qty 1.1

## 2018-08-05 NOTE — Progress Notes (Signed)
Ok to treat with out results from [facility] today per Dr.Sherrill

## 2018-08-05 NOTE — Patient Instructions (Signed)
[facility] Discharge Instructions for Patients Receiving Chemotherapy  Today you received the following chemotherapy agents Velcade.  To help prevent nausea and vomiting after your treatment, we encourage you to take your nausea medication as directed.  If you develop nausea and vomiting that is not controlled by your nausea medication, call the clinic.   BELOW ARE SYMPTOMS THAT SHOULD BE REPORTED IMMEDIATELY:  *FEVER GREATER THAN 100.5 F  *CHILLS WITH OR WITHOUT FEVER  NAUSEA AND VOMITING THAT IS NOT CONTROLLED WITH YOUR NAUSEA MEDICATION  *UNUSUAL SHORTNESS OF BREATH  *UNUSUAL BRUISING OR BLEEDING  TENDERNESS IN MOUTH AND THROAT WITH OR WITHOUT PRESENCE OF ULCERS  *URINARY PROBLEMS  *BOWEL PROBLEMS  UNUSUAL RASH Items with * indicate a potential emergency and should be followed up as soon as possible.  Feel free to call the clinic should you have any questions or concerns. The clinic phone number is [phone].  Please show the CHEMO ALERT CARD at check-in to the Emergency Department and triage nurse.   

## 2018-08-05 NOTE — Telephone Encounter (Signed)
Multiple attempts to reach the patient with no success. No VM set up. Per Dr.Sherrill pt needs to hold her cytoxan and lasix. He would also like to know if she is dehydrated or having diarrhea. Also is she taking any new medications since her creatine is elevated today. The patient needs a BMET drawn on 10/18. Will follow up with this.

## 2018-08-06 ENCOUNTER — Inpatient Hospital Stay: Payer: Medicare HMO

## 2018-08-06 ENCOUNTER — Telehealth: Payer: Self-pay | Admitting: *Deleted

## 2018-08-06 ENCOUNTER — Other Ambulatory Visit: Payer: Self-pay | Admitting: *Deleted

## 2018-08-06 ENCOUNTER — Other Ambulatory Visit: Payer: Self-pay | Admitting: Nurse Practitioner

## 2018-08-06 ENCOUNTER — Telehealth: Payer: Self-pay | Admitting: Hematology

## 2018-08-06 DIAGNOSIS — Z5112 Encounter for antineoplastic immunotherapy: Secondary | ICD-10-CM | POA: Diagnosis not present

## 2018-08-06 DIAGNOSIS — C9002 Multiple myeloma in relapse: Secondary | ICD-10-CM

## 2018-08-06 LAB — BASIC METABOLIC PANEL - CANCER CENTER ONLY
ANION GAP: 9 (ref 5–15)
BUN: 23 mg/dL (ref 8–23)
CALCIUM: 7.9 mg/dL — AB (ref 8.9–10.3)
CO2: 8 mmol/L — AB (ref 22–32)
Chloride: 124 mmol/L — ABNORMAL HIGH (ref 98–111)
Creatinine: 3.13 mg/dL (ref 0.44–1.00)
GFR, EST NON AFRICAN AMERICAN: 14 mL/min — AB (ref >60)
GFR, Est AFR Am: 16 mL/min — ABNORMAL LOW (ref >60)
GLUCOSE: 74 mg/dL (ref 70–99)
Potassium: 3.8 mmol/L (ref 3.5–5.1)
Sodium: 141 mmol/L (ref 135–145)

## 2018-08-06 NOTE — Progress Notes (Signed)
Err

## 2018-08-06 NOTE — Telephone Encounter (Signed)
Appts scheduled patients sister notified per 10/18 sch msg

## 2018-08-06 NOTE — Telephone Encounter (Signed)
Spoke to patient- only one episode of diarrhea noted late last night. Nothing since she took an imodium. Patient reports she drinks water on a consistent basis. She does not feel dehydrated. She feels she is tolerating treatment well. Patient finished her 3 weeks on Cytoxan last night. She is now off for 3 weeks. She will refrain from taking lasix until she hears back from [place] it is ok. She only takes with signs of edema. This is not a daily medication. No new medications. Patient agrees to come in for lab work today.

## 2018-08-06 NOTE — Telephone Encounter (Signed)
Telephone call to patient- spoke to her sister. She is not aware of any new medications. She will give her the message and have her return my call to discuss further. Lab appt scheduled for 1115 today. Sister will have her come in. Orders entered.

## 2018-08-09 ENCOUNTER — Encounter: Payer: Self-pay | Admitting: Nurse Practitioner

## 2018-08-09 ENCOUNTER — Inpatient Hospital Stay: Payer: Medicare HMO

## 2018-08-09 ENCOUNTER — Ambulatory Visit: Payer: Medicare HMO

## 2018-08-09 ENCOUNTER — Other Ambulatory Visit: Payer: Medicare HMO

## 2018-08-09 ENCOUNTER — Ambulatory Visit: Payer: Medicare HMO | Admitting: Oncology

## 2018-08-09 ENCOUNTER — Inpatient Hospital Stay (HOSPITAL_BASED_OUTPATIENT_CLINIC_OR_DEPARTMENT_OTHER): Payer: Medicare HMO | Admitting: Nurse Practitioner

## 2018-08-09 VITALS — BP 146/68 | HR 71 | Temp 98.8°F | Resp 18 | Ht 63.0 in | Wt 175.9 lb

## 2018-08-09 DIAGNOSIS — C9000 Multiple myeloma not having achieved remission: Secondary | ICD-10-CM | POA: Diagnosis not present

## 2018-08-09 DIAGNOSIS — C9002 Multiple myeloma in relapse: Secondary | ICD-10-CM

## 2018-08-09 DIAGNOSIS — Z5112 Encounter for antineoplastic immunotherapy: Secondary | ICD-10-CM | POA: Diagnosis not present

## 2018-08-09 LAB — CBC WITH DIFFERENTIAL (CANCER CENTER ONLY)
ABS IMMATURE GRANULOCYTES: 0.02 10*3/uL (ref 0.00–0.07)
BASOS PCT: 0 %
Basophils Absolute: 0 10*3/uL (ref 0.0–0.1)
EOS PCT: 1 %
Eosinophils Absolute: 0 10*3/uL (ref 0.0–0.5)
HCT: 29.6 % — ABNORMAL LOW (ref 36.0–46.0)
HEMOGLOBIN: 9 g/dL — AB (ref 12.0–15.0)
IMMATURE GRANULOCYTES: 1 %
LYMPHS PCT: 6 %
Lymphs Abs: 0.2 10*3/uL — ABNORMAL LOW (ref 0.7–4.0)
MCH: 28.8 pg (ref 26.0–34.0)
MCHC: 30.4 g/dL (ref 30.0–36.0)
MCV: 94.9 fL (ref 80.0–100.0)
Monocytes Absolute: 0.7 10*3/uL (ref 0.1–1.0)
Monocytes Relative: 17 %
NEUTROS ABS: 3.1 10*3/uL (ref 1.7–7.7)
NRBC: 0 % (ref 0.0–0.2)
Neutrophils Relative %: 75 %
PLATELETS: 140 10*3/uL — AB (ref 150–400)
RBC: 3.12 MIL/uL — AB (ref 3.87–5.11)
RDW: 19.2 % — ABNORMAL HIGH (ref 11.5–15.5)
WBC: 4.1 10*3/uL (ref 4.0–10.5)

## 2018-08-09 LAB — CMP (CANCER CENTER ONLY)
ALT: 8 U/L (ref 0–44)
ANION GAP: 9 (ref 5–15)
AST: 11 U/L — ABNORMAL LOW (ref 15–41)
Albumin: 3.6 g/dL (ref 3.5–5.0)
Alkaline Phosphatase: 78 U/L (ref 38–126)
BUN: 20 mg/dL (ref 8–23)
CHLORIDE: 123 mmol/L — AB (ref 98–111)
CO2: 10 mmol/L — ABNORMAL LOW (ref 22–32)
Calcium: 8.8 mg/dL — ABNORMAL LOW (ref 8.9–10.3)
Creatinine: 2.27 mg/dL — ABNORMAL HIGH (ref 0.44–1.00)
GFR, EST AFRICAN AMERICAN: 24 mL/min — AB (ref >60)
GFR, EST NON AFRICAN AMERICAN: 21 mL/min — AB (ref >60)
Glucose, Bld: 90 mg/dL (ref 70–99)
POTASSIUM: 4.1 mmol/L (ref 3.5–5.1)
Sodium: 142 mmol/L (ref 135–145)
Total Bilirubin: 0.6 mg/dL (ref 0.3–1.2)
Total Protein: 8.5 g/dL — ABNORMAL HIGH (ref 6.5–8.1)

## 2018-08-09 NOTE — Progress Notes (Addendum)
[facility]  [phone] [phone]  Clinic Follow up Note   Patient Care Team: Nolene Ebbs, MD as PCP - General (Internal Medicine) 08/09/2018  DIAGNOSIS: Multiple Myeloma   INTERVAL HISTORY: [name] returns for labs and f/u as scheduled. She completed cycle 2 cytoxan/velcade and decadron day 1 on 10/3, day 8 on 10/10, and day 15 on 10/17. She was found to have elevated creatinine and returns for monitoring. She had a "rough" weekend and "felt bad." She rested on couch most of the weekend, [name] weak and tired. Has no appetite but drinking liquids, 3-4 cups per day with 2-3 ensure. Denies n/v/c/d or mucositis. She is urinating yellow urine without dysuria or hematuria, but does not urine has an odor. Not taking lasix. She had few episodes of shaking chills over the weekend without fever. Denies cough, chest pain, or dyspnea. Neuropathy is stable. Takes advil q2-3 hours PRN for pulled muscle in her left back in addition to tramadol and muscle relaxer. Her PCP manages this and recommends MRI in the future.    MEDICAL HISTORY:  Past Medical History:  Diagnosis Date  . ACS (acute coronary syndrome) ([place]) 06/2017  . Anemia    "off and on all my life" (12/22/2016)  . Anxiety   . Bilateral renal artery stenosis ([place]) 1999   s/p stenting  . Chronic kidney disease (CKD), stage III (moderate) ([place])    Abigail [name] 12/22/2016  . Coronary artery disease 1999  . Depression   . Diverticulosis   . Duodenitis   . DVT (deep venous thrombosis) ([facility])    on Coumadin  . Dyspnea   . Gastric erosions   . Gastric ulcer   . GERD (gastroesophageal reflux disease)   . Heart murmur   . History of blood transfusion ~ 03/2016   "low HgB; practically nonexistent"  . Hyperlipidemia   . Hypertension   . MI (myocardial infarction) ([place]) 1999  . Multiple myeloma ([place]) dx'd ~ 04/2016  . PAT (paroxysmal atrial tachycardia) ([place]) 12/22/2016  . Retinal hemorrhage, right eye   .  Schatzki's ring   . Tachy-brady syndrome ([place])    Abigail [name] 12/22/2016  . Tubular adenoma of colon     SURGICAL HISTORY: Past Surgical History:  Procedure Laterality Date  . APPENDECTOMY    . BACK SURGERY    . CATARACT EXTRACTION W/ INTRAOCULAR LENS IMPLANT Right 2014  . COLONOSCOPY WITH PROPOFOL N/A 09/04/2017   Procedure: COLONOSCOPY WITH PROPOFOL;  Surgeon: Doran Stabler, MD;  Location: [facility];  Service: Gastroenterology;  Laterality: N/A;  . CORONARY ANGIOGRAPHY N/A 01/23/2017   Procedure: Coronary Angiography;  Surgeon: [name], MD;  Location: [facility];  Service: Cardiovascular;  Laterality: N/A;  . CORONARY ANGIOPLASTY WITH STENT PLACEMENT  1999  . CORONARY STENT INTERVENTION N/A 12/29/2016   Procedure: Coronary Stent Intervention;  Surgeon: Charolette Forward, MD;  Location: [facility];  Service: Cardiovascular;  Laterality: N/A;  . CORONARY STENT INTERVENTION N/A 02/24/2017   Procedure: Coronary Stent Intervention;  Surgeon: Charolette Forward, MD;  Location: [facility];  Service: Cardiovascular;  Laterality: N/A;  . ESOPHAGOGASTRODUODENOSCOPY N/A 11/03/2015   Procedure: ESOPHAGOGASTRODUODENOSCOPY (EGD);  Surgeon: Carol Ada, MD;  [facility];  Service: Endoscopy;  Laterality: N/A;  . ESOPHAGOGASTRODUODENOSCOPY N/A 09/03/2017   Procedure: ESOPHAGOGASTRODUODENOSCOPY (EGD);  Surgeon: Doran Stabler, MD;  Location: [facility];  Service: Gastroenterology;  Laterality: N/A;  . ESOPHAGOGASTRODUODENOSCOPY (EGD) WITH PROPOFOL N/A 11/18/2016  Procedure: ESOPHAGOGASTRODUODENOSCOPY (EGD) WITH PROPOFOL;  Surgeon: Ladene Artist, MD;  Location: [facility];  Service: Endoscopy;  Laterality: N/A;  . LEFT HEART CATH AND CORONARY ANGIOGRAPHY N/A 12/24/2016   Procedure: Left Heart Cath and Coronary Angiography;  Surgeon: [name], MD;  Location: [facility];  Service: Cardiovascular;  Laterality: N/A;  . LEFT HEART CATH AND CORONARY ANGIOGRAPHY  N/A 02/24/2017   Procedure: Left Heart Cath and Coronary Angiography;  Surgeon: Charolette Forward, MD;  Location: [facility];  Service: Cardiovascular;  Laterality: N/A;  . LEFT HEART CATH AND CORONARY ANGIOGRAPHY N/A 03/05/2017   Procedure: Left Heart Cath and Coronary Angiography;  Surgeon: [name], MD;  Location: [facility];  Service: Cardiovascular;  Laterality: N/A;  . LUMBAR [place]    . RENAL ARTERY STENT  1999   bil RAS so ? bil vs unilateral stents  . RETINAL LASER PROCEDURE Right 2012   "bleeding"  . TONSILLECTOMY    . TOTAL ABDOMINAL HYSTERECTOMY      I have reviewed the social history and family history with the patient and they are unchanged from previous note.  ALLERGIES:  is allergic to sulfa antibiotics.  MEDICATIONS:  Current Outpatient Medications  Medication Sig Dispense Refill  . acyclovir (ZOVIRAX) 400 MG tablet TAKE 1 TABLET (400 MG TOTAL) BY MOUTH 2 (TWO) TIMES DAILY. 60 tablet 2  . Ascorbic Acid (VITAMIN C) 1000 MG tablet Take 1,000 mg by mouth daily.     Marland Kitchen aspirin EC 81 MG tablet Take 1 tablet (81 mg total) by mouth daily. 30 tablet 2  . atorvastatin (LIPITOR) 80 MG tablet Take 1 tablet (80 mg total) by mouth daily at 6 PM. 30 tablet 3  . calcium carbonate (OS-CAL - DOSED IN MG OF ELEMENTAL CALCIUM) 1250 (500 Ca) MG tablet Take 1 tablet by mouth daily with breakfast.    . Cholecalciferol (VITAMIN D PO) Take 1 capsule by mouth daily.    Marland Kitchen dexamethasone (DECADRON) 4 MG tablet Take 5 tabs (20 mg) on 9/5, 9/12 and 9/19 15 tablet 1  . ferrous sulfate 325 (65 FE) MG tablet Take 1 tablet (325 mg total) by mouth daily with breakfast. 30 tablet 3  . hydrALAZINE (APRESOLINE) 25 MG tablet Take 1 tablet (25 mg total) by mouth 2 (two) times daily. 60 tablet 6  . ibuprofen (ADVIL,MOTRIN) 200 MG tablet Take 200 mg by mouth every 6 (six) hours as needed.    . nitroGLYCERIN (NITROSTAT) 0.4 MG SL tablet Place 0.4 mg under the tongue every 5 (five) minutes as  needed for chest pain.  1  . pantoprazole (PROTONIX) 40 MG tablet Take 1 tablet (40 mg total) by mouth daily. 30 tablet 2  . potassium chloride SA (K-DUR,KLOR-CON) 20 MEQ tablet Take 1 tablet twice daily for 3 days, then 1 tablet daily 33 tablet 1  . pyridostigmine (MESTINON) 60 MG tablet Take 0.5 tablets (30 mg total) by mouth 2 (two) times daily. 30 tablet 3  . ticagrelor (BRILINTA) 60 MG TABS tablet Take 60 mg 2 (two) times daily by mouth.    Marland Kitchen tiZANidine (ZANAFLEX) 4 MG tablet Take 4 mg by mouth 2 (two) times daily as needed.  2  . traMADol (ULTRAM) 50 MG tablet Take 1 tablet by mouth every 6 (six) hours as needed.  0  . cyclophosphamide (CYTOXAN) 50 MG capsule Take 10 capsules (578m) by mouth once weekly for 3 weeks on, 1 week off, repeat every 4 weeks.  Take early in the day. Maintain hydration. (Patient not taking: Reported on 08/09/2018) 30 capsule 0  . furosemide (LASIX) 40 MG tablet TAKE 1 TABLET (40 MG TOTAL) BY MOUTH DAILY AS NEEDED FOR FLUID  3   No current facility-administered medications for this visit.     PHYSICAL EXAMINATION: ECOG PERFORMANCE STATUS: 2-3   Vitals:   08/09/18 0901  BP: (!) 146/68  Pulse: 71  Resp: 18  Temp: 98.8 F (37.1 C)  SpO2: 100%   Filed Weights   08/09/18 0901  Weight: 175 lb 14.4 oz (79.8 kg)    GENERAL:alert, no distress and comfortable SKIN: no rashes or significant lesions. Bruising to lower abdomen  EYES: sclera clear OROPHARYNX:no thrush or ulcers LYMPH:  no palpable cervical lymphadenopathy LUNGS: clear to auscultation with normal breathing effort HEART: regular rate & rhythm, no lower extremity edema ABDOMEN:abdomen soft, non-tender and normal bowel sounds NEURO: alert & oriented x 3 with fluent speech  LABORATORY DATA:  I have reviewed the data as listed CBC Latest Ref Rng & Units 08/09/2018 08/05/2018 07/29/2018  WBC 4.0 - 10.5 K/uL 4.1 6.9 6.0  Hemoglobin 12.0 - 15.0 g/dL 9.0(L) 9.0(L) 8.5(L)  Hematocrit 36.0 - 46.0 %  29.6(L) 30.2(L) 28.3(L)  Platelets 150 - 400 K/uL 140(L) 211 165     CMP Latest Ref Rng & Units 08/09/2018 08/06/2018 08/05/2018  Glucose 70 - 99 mg/dL 90 74 93  BUN 8 - 23 mg/dL '20 23 20  ' Creatinine 0.44 - 1.00 mg/dL 2.27(H) 3.13(HH) 3.26(HH)  Sodium 135 - 145 mmol/L 142 141 141  Potassium 3.5 - 5.1 mmol/L 4.1 3.8 4.0  Chloride 98 - 111 mmol/L 123(H) 124(H) 122(H)  CO2 22 - 32 mmol/L 10(L) 8(L) 8(L)  Calcium 8.9 - 10.3 mg/dL 8.8(L) 7.9(L) 8.2(L)  Total Protein 6.5 - 8.1 g/dL 8.5(H) - 9.2(H)  Total Bilirubin 0.3 - 1.2 mg/dL 0.6 - 0.5  Alkaline Phos 38 - 126 U/L 78 - 120  AST 15 - 41 U/L 11(L) - 17  ALT 0 - 44 U/L 8 - 7     RADIOGRAPHIC STUDIES: I have personally reviewed the radiological images as listed and agreed with the findings in the report. No results found.   1. Multiple myeloma, IgG lambda, bone marrow biopsy 06/15/2016 confirmed multiple myeloma; cytogenetics by FISHshow +11, +12, 13q-  Elevated serum free lambda light chains  Lambda light chain proteinuria  Lytic bone lesions on a bone survey 06/13/2016  Initiation of weekly Velcade/Decadron 06/19/2016  Serum light chains improved 07/31/2016  Initiation of Revlimid 11/03/20172 weeks on/2 weeks off  Serum M spike and IgG significantly improved 09/25/2016  Velcade held on 10/30/2016 due to urinary retention  Treatment resumed with Revlimid (2 weeks on/2 weeks off) and weekly Decadron on 02/12/2017, treatment placed on hold May 2018 secondary to admissions with cardiac disease  05/27/2018-increased serum M spike, IgG, serum lambda light chains  Bone survey 05/31/2018-widespread myeloma without detectable progression since 06/13/2016.  Cycle 1 Cytoxan/Velcade/Decadron 06/24/2018  Cycle 2 Cytoxan/Velcade/Decadron 07/22/2018 2. Severe anemia secondary to #1-markedly improved  3. Diffuse lytic bone lesions secondary to multiple myeloma, status post Zometa 09/18/2016(plan to continue every 3 months);most  recent Zometa infusion 10/08/2017  4. History of coronary artery disease/myocardial infarction  5. History of colon polyps  6. Bilateral leg edema 09/04/2016. Negative bilateral venous Doppler 09/05/2016.  7. Mild periorbital edema 09/18/2016, question related to early stye formation, question Velcade related chalazia.Improved.  8. CT scan 10/27/2016 with a new right kidney massevaluated  by urology; MRI abdomen 11/18/2016 multiple bilateral renal cysts. Most of these are simple cysts. The 2 lesions in question on the CT scan are both hemorrhagic or proteinaceous cysts. No worrisome contrast enhancement.  9. Urinary retention 10/28/2016 status post evaluation in the emergency department. Urinalysis negative for signs of infection 10/30/2016. Velcade held. Resolved.  10. Abdominal pain, nausea, vomiting.Etiology unclear. Upper endoscopy 11/18/2016 with findings of reflux esophagitis, benign appearing esophageal stenosis, small hiatal hernia and erythematous duodenopathy. CT abdomen/pelvis 11/19/2016 with no acute abnormality.  11. 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 Left lower extremity DVT May 2018.Maintainedon Coumadin.Coumadin discontinued October 2018 secondary to a supratherapeutic PT/INR and GI bleeding  13.Admission with GI bleeding October and November 2018-felt secondary to diverticular bleeding, initially while on supratherapeutic Coumadin, colonoscopy 09/04/2017-no source of blood loss identified, superficial tear at the posterior anal mucosa without stigmata of bleeding  14. Acute creatinine elevation 3.26 on 08/05/2018 of cycle 2 day 15; confirmed on repeat 3.13 on 08/06/18; improved to 2/27 on 10/21; 24 hour urine is pending. Currently holding lasix, no recent CT contrast. Recently increased NSAID use for back pain, encouraged to decrease use and increase po hydration   Disposition:  Ms. Deangelo appears stable. She completed  cycle 2 cytoxan/velcade;decadron. She has increased weakness and fatigue over last few days, but otherwise tolerated well. Her labs reflect acutely elevated creatinine on day 15, up to 3.26 from baseline 1.2 - 1.5. Creatinine is trending down, 2.27 today. The patient was seen with Dr. Benay Spice who reviewed differential for elevated creatinine, including worsening multiple myeloma, dehydration, and/or nephrotoxic drugs such as increased NSAIDS. She takes Advil frequently for back pain, advised to limit use. She does not appear dehydrated. MM labs pending.   She will return next week for lab and f/u prior to beginning cycle 3. She will perform 24 hour urine test. Will refill Cytoxan today but she knows to hold it until she is seen in f/u and told to restart pending labs.    Orders Placed This Encounter  Procedures  . CBC with Differential (Cancer Center Only)    Standing Status:   Future    Standing Expiration Date:   08/10/2019  . CMP ([facility] only)    Standing Status:   Future    Standing Expiration Date:   08/10/2019   All questions were answered. The patient knows to call the clinic with any problems, questions or concerns. No barriers to learning was detected.     Abigail Feeling, NP 08/09/18  This was a shared visit with Abigail [name].  Ms. Sautter has developed progressive renal insufficiency over the past week.  The etiology is unclear.  The creatinine is partially improved today.  Systemic therapy for myeloma will remain on hold.  She will hold ibuprofen and furosemide.  We will obtain a 24-hour urine. We discussed the differential diagnosis for the renal failure with Ms. Arocho.  She understands this could be related to multiple myeloma.  We will change systemic therapy if the creatinine remains elevated when she returns next week.  Abigail Manson, MD

## 2018-08-10 ENCOUNTER — Other Ambulatory Visit: Payer: Self-pay | Admitting: Emergency Medicine

## 2018-08-10 ENCOUNTER — Telehealth: Payer: Self-pay | Admitting: Hematology

## 2018-08-10 DIAGNOSIS — C9002 Multiple myeloma in relapse: Secondary | ICD-10-CM

## 2018-08-10 LAB — KAPPA/LAMBDA LIGHT CHAINS
Kappa free light chain: 5.2 mg/L (ref 3.3–19.4)
Kappa, lambda light chain ratio: 0.08 — ABNORMAL LOW (ref 0.26–1.65)
Lambda free light chains: 64.1 mg/L — ABNORMAL HIGH (ref 5.7–26.3)

## 2018-08-10 LAB — IGG, IGA, IGM
IGA: 25 mg/dL — AB (ref 64–422)
IGM (IMMUNOGLOBULIN M), SRM: 21 mg/dL — AB (ref 26–217)
IgG (Immunoglobin G), Serum: 2453 mg/dL — ABNORMAL HIGH (ref 700–1600)

## 2018-08-10 MED ORDER — CYCLOPHOSPHAMIDE 50 MG PO CAPS: ORAL_CAPSULE | ORAL | 0 refills | Status: DC

## 2018-08-10 NOTE — Telephone Encounter (Signed)
No los 10/21

## 2018-08-11 LAB — PROTEIN ELECTROPHORESIS, SERUM
A/G RATIO SPE: 1 (ref 0.7–1.7)
ALBUMIN ELP: 3.8 g/dL (ref 2.9–4.4)
Alpha-1-Globulin: 0.3 g/dL (ref 0.0–0.4)
Alpha-2-Globulin: 0.8 g/dL (ref 0.4–1.0)
BETA GLOBULIN: 0.9 g/dL (ref 0.7–1.3)
GAMMA GLOBULIN: 1.9 g/dL — AB (ref 0.4–1.8)
Globulin, Total: 3.9 g/dL (ref 2.2–3.9)
M-Spike, %: 1.7 g/dL — ABNORMAL HIGH
Total Protein ELP: 7.7 g/dL (ref 6.0–8.5)

## 2018-08-13 DIAGNOSIS — Z5112 Encounter for antineoplastic immunotherapy: Secondary | ICD-10-CM | POA: Diagnosis not present

## 2018-08-16 LAB — UPEP/TP, 24-HR URINE
Albumin, U: 27.9 %
Alpha 1, Urine: 2.7 %
Alpha 2, Urine: 21.8 %
Beta, Urine: 26.7 %
Gamma Globulin, Urine: 20.9 %
M-SPIKE, %-U24PEL: 12.9 % — AB
M-SPIKE, MG/24 HR U24PEL: 28.6 mg/(24.h) — AB
TOTAL VOLUME: 550
Total Protein, Urine-Ur/day: 222 mg/24 hr — ABNORMAL HIGH (ref 30–150)
Total Protein, Urine: 40.3 mg/dL

## 2018-08-17 ENCOUNTER — Telehealth: Payer: Self-pay | Admitting: Nurse Practitioner

## 2018-08-17 NOTE — Telephone Encounter (Signed)
Tried to reach regarding voicemail I did leave a message °

## 2018-08-19 ENCOUNTER — Inpatient Hospital Stay (HOSPITAL_BASED_OUTPATIENT_CLINIC_OR_DEPARTMENT_OTHER): Payer: Medicare HMO | Admitting: Nurse Practitioner

## 2018-08-19 ENCOUNTER — Inpatient Hospital Stay: Payer: Medicare HMO

## 2018-08-19 ENCOUNTER — Encounter: Payer: Self-pay | Admitting: Nurse Practitioner

## 2018-08-19 ENCOUNTER — Inpatient Hospital Stay: Payer: Medicare HMO | Admitting: Nutrition

## 2018-08-19 VITALS — BP 149/68 | HR 72 | Temp 100.0°F | Resp 17 | Ht 63.0 in | Wt 175.6 lb

## 2018-08-19 DIAGNOSIS — C9002 Multiple myeloma in relapse: Secondary | ICD-10-CM

## 2018-08-19 DIAGNOSIS — Z86010 Personal history of colonic polyps: Secondary | ICD-10-CM | POA: Diagnosis not present

## 2018-08-19 DIAGNOSIS — J349 Unspecified disorder of nose and nasal sinuses: Secondary | ICD-10-CM

## 2018-08-19 DIAGNOSIS — C9000 Multiple myeloma not having achieved remission: Secondary | ICD-10-CM | POA: Diagnosis not present

## 2018-08-19 DIAGNOSIS — I252 Old myocardial infarction: Secondary | ICD-10-CM | POA: Diagnosis not present

## 2018-08-19 DIAGNOSIS — D630 Anemia in neoplastic disease: Secondary | ICD-10-CM | POA: Diagnosis not present

## 2018-08-19 DIAGNOSIS — R944 Abnormal results of kidney function studies: Secondary | ICD-10-CM

## 2018-08-19 DIAGNOSIS — R509 Fever, unspecified: Secondary | ICD-10-CM

## 2018-08-19 DIAGNOSIS — Z5112 Encounter for antineoplastic immunotherapy: Secondary | ICD-10-CM | POA: Diagnosis not present

## 2018-08-19 LAB — CBC WITH DIFFERENTIAL (CANCER CENTER ONLY)
Abs Immature Granulocytes: 0.04 10*3/uL (ref 0.00–0.07)
Basophils Absolute: 0 10*3/uL (ref 0.0–0.1)
Basophils Relative: 0 %
EOS ABS: 0.1 10*3/uL (ref 0.0–0.5)
Eosinophils Relative: 1 %
HCT: 27.9 % — ABNORMAL LOW (ref 36.0–46.0)
Hemoglobin: 8.4 g/dL — ABNORMAL LOW (ref 12.0–15.0)
Immature Granulocytes: 1 %
Lymphocytes Relative: 8 %
Lymphs Abs: 0.6 10*3/uL — ABNORMAL LOW (ref 0.7–4.0)
MCH: 28.1 pg (ref 26.0–34.0)
MCHC: 30.1 g/dL (ref 30.0–36.0)
MCV: 93.3 fL (ref 80.0–100.0)
MONO ABS: 1.5 10*3/uL — AB (ref 0.1–1.0)
Monocytes Relative: 19 %
NEUTROS ABS: 5.7 10*3/uL (ref 1.7–7.7)
Neutrophils Relative %: 71 %
Platelet Count: 232 10*3/uL (ref 150–400)
RBC: 2.99 MIL/uL — AB (ref 3.87–5.11)
RDW: 19.1 % — ABNORMAL HIGH (ref 11.5–15.5)
WBC: 7.9 10*3/uL (ref 4.0–10.5)
nRBC: 0 % (ref 0.0–0.2)

## 2018-08-19 LAB — CMP (CANCER CENTER ONLY)
ALK PHOS: 102 U/L (ref 38–126)
ALT: 8 U/L (ref 0–44)
AST: 16 U/L (ref 15–41)
Albumin: 3.4 g/dL — ABNORMAL LOW (ref 3.5–5.0)
Anion gap: 7 (ref 5–15)
BUN: 13 mg/dL (ref 8–23)
CALCIUM: 8.8 mg/dL — AB (ref 8.9–10.3)
CHLORIDE: 116 mmol/L — AB (ref 98–111)
CO2: 18 mmol/L — AB (ref 22–32)
CREATININE: 1.74 mg/dL — AB (ref 0.44–1.00)
GFR, EST AFRICAN AMERICAN: 33 mL/min — AB (ref >60)
GFR, EST NON AFRICAN AMERICAN: 28 mL/min — AB (ref >60)
Glucose, Bld: 92 mg/dL (ref 70–99)
Potassium: 4.5 mmol/L (ref 3.5–5.1)
SODIUM: 141 mmol/L (ref 135–145)
Total Bilirubin: 0.7 mg/dL (ref 0.3–1.2)
Total Protein: 7.9 g/dL (ref 6.5–8.1)

## 2018-08-19 NOTE — Progress Notes (Signed)
[facility] OFFICE PROGRESS NOTE   Diagnosis: Multiple myeloma  INTERVAL HISTORY:   Abigail [name] returns as scheduled.  She completed cycle 2-week 3 Cytoxan/Velcade/dexamethasone 08/05/2018.  She denies nausea/vomiting.  No mouth sores.  No constipation or diarrhea.  For the past several days she has had a runny nose and low-grade fever.  She has felt "chilled" but is not having shaking chills.  She denies cough.  No shortness of breath.  No sore throat.  Objective:  Vital signs in last 24 hours:  Blood pressure (!) 149/68, pulse 72, temperature 100 F (37.8 C), temperature source Oral, resp. rate 17, height '5\' 3"'  (1.6 m), weight 175 lb 9.6 oz (79.7 kg), SpO2 99 %.    HEENT: No thrush or ulcers. Resp: Lungs clear bilaterally. Cardio: Regular rate and rhythm. GI: Abdomen soft and nontender.  No hepatomegaly. Vascular: No leg edema.    Lab Results:  Lab Results  Component Value Date   WBC 7.9 08/19/2018   HGB 8.4 (L) 08/19/2018   HCT 27.9 (L) 08/19/2018   MCV 93.3 08/19/2018   PLT 232 08/19/2018   NEUTROABS 5.7 08/19/2018    Imaging:  No results found.  Medications: I have reviewed the patient's current medications.  Assessment/Plan: 1. Multiple myeloma, IgG lambda, bone marrow biopsy 06/15/2016 confirmed multiple myeloma; cytogenetics by FISHshow +11, +12, 13q-  Elevated serum free lambda light chains  Lambda light chain proteinuria  Lytic bone lesions on a bone survey 06/13/2016  Initiation of weekly Velcade/Decadron 06/19/2016  Serum light chains improved 07/31/2016  Initiation of Revlimid 11/03/20172 weeks on/2 weeks off  Serum M spike and IgG significantly improved 09/25/2016  Velcade held on 10/30/2016 due to urinary retention  Treatment resumed with Revlimid (2 weeks on/2 weeks off) and weekly Decadron on 02/12/2017, treatment placed on hold May 2018 secondary to admissions with cardiac disease  05/27/2018-increased serum M spike,  IgG, serum lambda light chains  Bone survey 05/31/2018-widespread myeloma without detectable progression since 06/13/2016.  Cycle 1 Cytoxan/Velcade/Decadron 06/24/2018  Cycle 2 Cytoxan/Velcade/Decadron 07/22/2018  08/09/2018 serum light chains, M spike, IgG improved 2. Severe anemia secondary to #1-markedly improved  3. Diffuse lytic bone lesions secondary to multiple myeloma, status post Zometa 09/18/2016(plan to continue every 3 months);most recent Zometa infusion 10/08/2017  4. History of coronary artery disease/myocardial infarction  5. History of colon polyps  6. Bilateral leg edema 09/04/2016. Negative bilateral venous Doppler 09/05/2016.  7. Mild periorbital edema 09/18/2016, question related to early stye formation, question Velcade related chalazia.Improved.  8. CT scan 10/27/2016 with a new right kidney massevaluated by urology; MRI abdomen 11/18/2016 multiple bilateral renal cysts. Most of these are simple cysts. The 2 lesions in question on the CT scan are both hemorrhagic or proteinaceous cysts. No worrisome contrast enhancement.  9. Urinary retention 10/28/2016 status post evaluation in the emergency department. Urinalysis negative for signs of infection 10/30/2016. Velcade held. Resolved.  10. Abdominal pain, nausea, vomiting.Etiology unclear. Upper endoscopy 11/18/2016 with findings of reflux esophagitis, benign appearing esophageal stenosis, small hiatal hernia and erythematous duodenopathy. CT abdomen/pelvis 11/19/2016 with no acute abnormality.  11. 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 Left lower extremity DVT May 2018.Maintainedon Coumadin.Coumadin discontinued October 2018 secondary to a supratherapeutic PT/INR and GI bleeding  13.Admission with GI bleeding October and November 2018-felt secondary to diverticular bleeding, initially while on supratherapeutic Coumadin, colonoscopy 09/04/2017-no source of blood  loss identified, superficial tear at the posterior anal mucosa without stigmata of bleeding  14. Acute creatinine elevation 3.26 on 08/05/2018 of cycle 2 day 15; confirmed on repeat 3.13 on 08/06/18; improved to 2/27 on 10/21; 24 hour urine is pending. Currently holding lasix, no recent CT contrast. Recently increased NSAID use for back pain, encouraged to decrease use and increase po hydration   Disposition: Abigail [name] appears stable.  She has completed 2 cycles of Cytoxan/Velcade/dexamethasone.  Recent myeloma labs show improvement.  She had an acute increase in the creatinine 08/05/2018.  Creatinine was improved on 08/09/2018 and is further improved today.  We decided to hold cycle 3-day 1 today and reschedule for 1 week.  At present she likely has a viral URI with low-grade fever and rhinorrhea.  She understands to contact the office with fever greater than equal to 100.5, shaking chills, shortness of breath, cough or any other worrisome symptoms.  She will return for lab, follow-up and cycle 3 Cytoxan/Velcade/dexamethasone 08/26/2018.  She will contact the office in the interim with any problems.  Plan reviewed with Dr. Benay Spice.    [name] ANP/GNP-BC   08/19/2018  2:26 PM

## 2018-08-20 LAB — PROTEIN ELECTROPHORESIS, SERUM
A/G Ratio: 0.9 (ref 0.7–1.7)
ALPHA-1-GLOBULIN: 0.4 g/dL (ref 0.0–0.4)
ALPHA-2-GLOBULIN: 0.9 g/dL (ref 0.4–1.0)
Albumin ELP: 3.4 g/dL (ref 2.9–4.4)
BETA GLOBULIN: 0.8 g/dL (ref 0.7–1.3)
Gamma Globulin: 1.7 g/dL (ref 0.4–1.8)
Globulin, Total: 3.8 g/dL (ref 2.2–3.9)
M-SPIKE, %: 1.5 g/dL — AB
Total Protein ELP: 7.2 g/dL (ref 6.0–8.5)

## 2018-08-20 LAB — KAPPA/LAMBDA LIGHT CHAINS
KAPPA, LAMDA LIGHT CHAIN RATIO: 0.08 — AB (ref 0.26–1.65)
Kappa free light chain: 6.2 mg/L (ref 3.3–19.4)
LAMDA FREE LIGHT CHAINS: 79 mg/L — AB (ref 5.7–26.3)

## 2018-08-20 LAB — IGG: IgG (Immunoglobin G), Serum: 2085 mg/dL — ABNORMAL HIGH (ref 700–1600)

## 2018-08-22 ENCOUNTER — Other Ambulatory Visit: Payer: Self-pay | Admitting: Oncology

## 2018-08-26 ENCOUNTER — Inpatient Hospital Stay: Payer: Medicare HMO

## 2018-08-26 ENCOUNTER — Ambulatory Visit (HOSPITAL_COMMUNITY)
Admission: RE | Admit: 2018-08-26 | Discharge: 2018-08-26 | Disposition: A | Discharge status: Home / Self Care | Payer: Medicare HMO | Source: Ambulatory Visit | Attending: Nurse Practitioner | Admitting: Nurse Practitioner

## 2018-08-26 ENCOUNTER — Inpatient Hospital Stay: Payer: Medicare HMO | Attending: Nurse Practitioner

## 2018-08-26 ENCOUNTER — Inpatient Hospital Stay (HOSPITAL_BASED_OUTPATIENT_CLINIC_OR_DEPARTMENT_OTHER): Payer: Medicare HMO | Admitting: Nurse Practitioner

## 2018-08-26 ENCOUNTER — Encounter: Payer: Self-pay | Admitting: Nurse Practitioner

## 2018-08-26 VITALS — BP 138/51 | HR 70 | Temp 98.0°F | Resp 17 | Ht 63.0 in | Wt 172.1 lb

## 2018-08-26 DIAGNOSIS — M545 Low back pain: Secondary | ICD-10-CM

## 2018-08-26 DIAGNOSIS — Z7901 Long term (current) use of anticoagulants: Secondary | ICD-10-CM | POA: Insufficient documentation

## 2018-08-26 DIAGNOSIS — Z86010 Personal history of colonic polyps: Secondary | ICD-10-CM

## 2018-08-26 DIAGNOSIS — I2510 Atherosclerotic heart disease of native coronary artery without angina pectoris: Secondary | ICD-10-CM | POA: Insufficient documentation

## 2018-08-26 DIAGNOSIS — M5136 Other intervertebral disc degeneration, lumbar region: Secondary | ICD-10-CM | POA: Insufficient documentation

## 2018-08-26 DIAGNOSIS — D649 Anemia, unspecified: Secondary | ICD-10-CM | POA: Insufficient documentation

## 2018-08-26 DIAGNOSIS — C9000 Multiple myeloma not having achieved remission: Secondary | ICD-10-CM

## 2018-08-26 DIAGNOSIS — Z9221 Personal history of antineoplastic chemotherapy: Secondary | ICD-10-CM | POA: Diagnosis not present

## 2018-08-26 DIAGNOSIS — R7989 Other specified abnormal findings of blood chemistry: Secondary | ICD-10-CM | POA: Diagnosis not present

## 2018-08-26 DIAGNOSIS — Z86718 Personal history of other venous thrombosis and embolism: Secondary | ICD-10-CM | POA: Diagnosis not present

## 2018-08-26 DIAGNOSIS — Z1501 Genetic susceptibility to malignant neoplasm of breast: Secondary | ICD-10-CM

## 2018-08-26 LAB — CMP (CANCER CENTER ONLY)
ALBUMIN: 2.9 g/dL — AB (ref 3.5–5.0)
ALT: 13 U/L (ref 0–44)
AST: 20 U/L (ref 15–41)
Alkaline Phosphatase: 101 U/L (ref 38–126)
Anion gap: 10 (ref 5–15)
BUN: 18 mg/dL (ref 8–23)
CO2: 14 mmol/L — ABNORMAL LOW (ref 22–32)
CREATININE: 2.1 mg/dL — AB (ref 0.44–1.00)
Calcium: 9.3 mg/dL (ref 8.9–10.3)
Chloride: 116 mmol/L — ABNORMAL HIGH (ref 98–111)
GFR, Est AFR Am: 26 mL/min — ABNORMAL LOW (ref >60)
GFR, Estimated: 23 mL/min — ABNORMAL LOW (ref >60)
GLUCOSE: 93 mg/dL (ref 70–99)
POTASSIUM: 4.2 mmol/L (ref 3.5–5.1)
Sodium: 140 mmol/L (ref 135–145)
TOTAL PROTEIN: 8.1 g/dL (ref 6.5–8.1)
Total Bilirubin: 0.5 mg/dL (ref 0.3–1.2)

## 2018-08-26 LAB — CBC WITH DIFFERENTIAL (CANCER CENTER ONLY)
ABS IMMATURE GRANULOCYTES: 0.39 10*3/uL — AB (ref 0.00–0.07)
BASOS PCT: 1 %
Basophils Absolute: 0.1 10*3/uL (ref 0.0–0.1)
Eosinophils Absolute: 0.1 10*3/uL (ref 0.0–0.5)
Eosinophils Relative: 1 %
HCT: 29.2 % — ABNORMAL LOW (ref 36.0–46.0)
HEMOGLOBIN: 8.8 g/dL — AB (ref 12.0–15.0)
Immature Granulocytes: 5 %
LYMPHS PCT: 10 %
Lymphs Abs: 0.8 10*3/uL (ref 0.7–4.0)
MCH: 27.8 pg (ref 26.0–34.0)
MCHC: 30.1 g/dL (ref 30.0–36.0)
MCV: 92.4 fL (ref 80.0–100.0)
MONOS PCT: 17 %
Monocytes Absolute: 1.4 10*3/uL — ABNORMAL HIGH (ref 0.1–1.0)
NEUTROS ABS: 5.4 10*3/uL (ref 1.7–7.7)
Neutrophils Relative %: 66 %
PLATELETS: 262 10*3/uL (ref 150–400)
RBC: 3.16 MIL/uL — ABNORMAL LOW (ref 3.87–5.11)
RDW: 18.6 % — ABNORMAL HIGH (ref 11.5–15.5)
WBC Count: 8.1 10*3/uL (ref 4.0–10.5)
nRBC: 0.4 % — ABNORMAL HIGH (ref 0.0–0.2)

## 2018-08-26 LAB — URINALYSIS, COMPLETE (UACMP) WITH MICROSCOPIC
BILIRUBIN URINE: NEGATIVE
GLUCOSE, UA: NEGATIVE mg/dL
Hgb urine dipstick: NEGATIVE
Ketones, ur: NEGATIVE mg/dL
Leukocytes, UA: NEGATIVE
Nitrite: NEGATIVE
PH: 5 (ref 5.0–8.0)
Protein, ur: 30 mg/dL — AB
Specific Gravity, Urine: 1.012 (ref 1.005–1.030)

## 2018-08-26 NOTE — Progress Notes (Addendum)
[facility] OFFICE PROGRESS NOTE   Diagnosis: Multiple myeloma  INTERVAL HISTORY:   Abigail [name] returns as scheduled.  She completed cycle 2-week 3 Cytoxan/Velcade/dexamethasone 08/05/2018.  Treatment has been on hold most recently due to an acute increase in the creatinine.  The creatinine was improved on 08/09/2018 and 08/19/2018.  Cold symptoms are better.  For the past 6 days she has had right low back pain.  The pain is present fairly consistently.  No radiation of the pain.  She notes mild weakness in both legs.  No numbness.  No bowel or bladder dysfunction.  She has not noticed a skin rash.  On a few occasions she has noted a small amount of bright red blood on the toilet tissue after a bowel movement.  She does not think she has hemorrhoids.  She has not been constipated.  Objective:  Vital signs in last 24 hours:  Blood pressure (!) 138/51, pulse 70, temperature 98 F (36.7 C), temperature source Oral, resp. rate 17, height '5\' 3"'  (1.6 m), weight 172 lb 1.6 oz (78.1 kg), SpO2 100 %.    HEENT: Mild white coating over tongue.  No buccal thrush. Resp: Lungs clear bilaterally. Cardio: Regular rate and rhythm. GI: Abdomen soft and nontender.  No hepatospleno megaly.  No external hemorrhoids. Vascular: No leg edema. Neuro: Lower extremity motor strength 5/5. Skin: No rash.   Lab Results:  Lab Results  Component Value Date   WBC 8.1 08/26/2018   HGB 8.8 (L) 08/26/2018   HCT 29.2 (L) 08/26/2018   MCV 92.4 08/26/2018   PLT 262 08/26/2018   NEUTROABS 5.4 08/26/2018    Imaging:  No results found.  Medications: I have reviewed the patient's current medications.  Assessment/Plan: 1. Multiple myeloma, IgG lambda, bone marrow biopsy 06/15/2016 confirmed multiple myeloma; cytogenetics by FISHshow +11, +12, 13q-  Elevated serum free lambda light chains  Lambda light chain proteinuria  Lytic bone lesions on a bone survey 06/13/2016  Initiation of weekly  Velcade/Decadron 06/19/2016  Serum light chains improved 07/31/2016  Initiation of Revlimid 11/03/20172 weeks on/2 weeks off  Serum M spike and IgG significantly improved 09/25/2016  Velcade held on 10/30/2016 due to urinary retention  Treatment resumed with Revlimid (2 weeks on/2 weeks off) and weekly Decadron on 02/12/2017, treatment placed on hold May 2018 secondary to admissions with cardiac disease  05/27/2018-increased serum M spike, IgG, serum lambda light chains  Bone survey 05/31/2018-widespread myeloma without detectable progression since 06/13/2016.  Cycle 1 Cytoxan/Velcade/Decadron 06/24/2018  Cycle 2 Cytoxan/Velcade/Decadron 07/22/2018  08/09/2018 serum light chains, M spike, IgG improved 2. Severe anemia secondary to #1-markedly improved  3. Diffuse lytic bone lesions secondary to multiple myeloma, status post Zometa 09/18/2016(plan to continue every 3 months);most recent Zometa infusion 10/08/2017  4. History of coronary artery disease/myocardial infarction  5. History of colon polyps  6. Bilateral leg edema 09/04/2016. Negative bilateral venous Doppler 09/05/2016.  7. Mild periorbital edema 09/18/2016, question related to early stye formation, question Velcade related chalazia.Improved.  8. CT scan 10/27/2016 with a new right kidney massevaluated by urology; MRI abdomen 11/18/2016 multiple bilateral renal cysts. Most of these are simple cysts. The 2 lesions in question on the CT scan are both hemorrhagic or proteinaceous cysts. No worrisome contrast enhancement.  9. Urinary retention 10/28/2016 status post evaluation in the emergency department. Urinalysis negative for signs of infection 10/30/2016. Velcade held. Resolved.  10. Abdominal pain, nausea, vomiting.Etiology unclear. Upper endoscopy 11/18/2016 with findings of reflux esophagitis, benign appearing  esophageal stenosis, small hiatal hernia and erythematous duodenopathy. CT abdomen/pelvis  11/19/2016 with no acute abnormality.  11. 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 Left lower extremity DVT May 2018.Maintainedon Coumadin.Coumadin discontinued October 2018 secondary to a supratherapeutic PT/INR and GI bleeding  13.Admission with GI bleeding October and November 2018-felt secondary to diverticular bleeding, initially while on supratherapeutic Coumadin, colonoscopy 09/04/2017-no source of blood loss identified, superficial tear at the posterior anal mucosa without stigmata of bleeding  14. Acute creatinine elevation 3.26 on 08/05/2018 of cycle 2 day 15; confirmed on repeat 3.13 on 08/06/18; improved to 2/27 on 10/21; 24 hour urine is pending. Currently holding lasix, no recent CT contrast. Recently increased NSAID use for back pain, encouraged to decrease use and increase po hydration    Disposition: Ms. Brase appears unchanged.  She has completed 2 cycles of Cytoxan/Velcade/dexamethasone.  Treatment subsequently placed on hold due to elevated creatinine of unclear etiology.  The creatinine is higher today.  We are holding today's scheduled chemotherapy.  She is being referred for a renal ultrasound.  She is having right low back pain.  We are referring her for an x-ray of the lumbar spine.  She may have a compression fracture.  She will return for lab, follow-up and possible chemotherapy in 1 week.  She will contact the office in the interim with any problems.  Patient seen with Dr. Benay Spice.  25 minutes were spent face-to-face at today's visit with the majority of that time involved in counseling/coordination of care.    [name] ANP/GNP-BC   08/26/2018  2:26 PM  This was a shared visit with [name].  Ms. Georgiades interviewed and examined.  The creatinine is further elevated today.  We decided to hold chemotherapy today.  We will investigate the back pain and renal failure further.  The IgG and M spike were improved last  week.  Julieanne Manson, MD

## 2018-08-28 ENCOUNTER — Other Ambulatory Visit: Payer: Self-pay | Admitting: Oncology

## 2018-08-30 ENCOUNTER — Telehealth: Payer: Self-pay | Admitting: Nurse Practitioner

## 2018-08-30 NOTE — Telephone Encounter (Signed)
Scheduled appt per 11/07 los - left message for patient with appt date and time

## 2018-08-31 ENCOUNTER — Telehealth: Payer: Self-pay | Admitting: Oncology

## 2018-08-31 NOTE — Telephone Encounter (Signed)
Faxed medical records to [facility] at [phone], Release [id number]

## 2018-09-01 ENCOUNTER — Emergency Department (HOSPITAL_COMMUNITY): Payer: Medicare HMO

## 2018-09-01 ENCOUNTER — Inpatient Hospital Stay (HOSPITAL_COMMUNITY)
Admission: EM | Admit: 2018-09-01 | Discharge: 2018-09-03 | DRG: 287 | Disposition: A | Discharge status: Home / Self Care | Payer: Medicare HMO | Attending: Cardiovascular Disease | Admitting: Cardiovascular Disease

## 2018-09-01 ENCOUNTER — Encounter (HOSPITAL_COMMUNITY): Payer: Self-pay | Admitting: Emergency Medicine

## 2018-09-01 DIAGNOSIS — N183 Chronic kidney disease, stage 3 (moderate): Secondary | ICD-10-CM | POA: Diagnosis present

## 2018-09-01 DIAGNOSIS — I129 Hypertensive chronic kidney disease with stage 1 through stage 4 chronic kidney disease, or unspecified chronic kidney disease: Secondary | ICD-10-CM | POA: Diagnosis present

## 2018-09-01 DIAGNOSIS — Z9049 Acquired absence of other specified parts of digestive tract: Secondary | ICD-10-CM

## 2018-09-01 DIAGNOSIS — Z803 Family history of malignant neoplasm of breast: Secondary | ICD-10-CM

## 2018-09-01 DIAGNOSIS — Z9221 Personal history of antineoplastic chemotherapy: Secondary | ICD-10-CM | POA: Diagnosis not present

## 2018-09-01 DIAGNOSIS — D500 Iron deficiency anemia secondary to blood loss (chronic): Secondary | ICD-10-CM | POA: Diagnosis present

## 2018-09-01 DIAGNOSIS — I252 Old myocardial infarction: Secondary | ICD-10-CM | POA: Diagnosis not present

## 2018-09-01 DIAGNOSIS — I249 Acute ischemic heart disease, unspecified: Secondary | ICD-10-CM | POA: Diagnosis present

## 2018-09-01 DIAGNOSIS — E785 Hyperlipidemia, unspecified: Secondary | ICD-10-CM | POA: Diagnosis present

## 2018-09-01 DIAGNOSIS — I25110 Atherosclerotic heart disease of native coronary artery with unstable angina pectoris: Principal | ICD-10-CM | POA: Diagnosis present

## 2018-09-01 DIAGNOSIS — R079 Chest pain, unspecified: Secondary | ICD-10-CM | POA: Diagnosis present

## 2018-09-01 DIAGNOSIS — C9000 Multiple myeloma not having achieved remission: Secondary | ICD-10-CM | POA: Diagnosis present

## 2018-09-01 DIAGNOSIS — I701 Atherosclerosis of renal artery: Secondary | ICD-10-CM | POA: Diagnosis present

## 2018-09-01 DIAGNOSIS — Z86010 Personal history of colonic polyps: Secondary | ICD-10-CM

## 2018-09-01 DIAGNOSIS — Z87891 Personal history of nicotine dependence: Secondary | ICD-10-CM | POA: Diagnosis not present

## 2018-09-01 DIAGNOSIS — I214 Non-ST elevation (NSTEMI) myocardial infarction: Secondary | ICD-10-CM

## 2018-09-01 DIAGNOSIS — Z882 Allergy status to sulfonamides status: Secondary | ICD-10-CM

## 2018-09-01 DIAGNOSIS — R011 Cardiac murmur, unspecified: Secondary | ICD-10-CM | POA: Diagnosis present

## 2018-09-01 DIAGNOSIS — Z9089 Acquired absence of other organs: Secondary | ICD-10-CM

## 2018-09-01 DIAGNOSIS — Z955 Presence of coronary angioplasty implant and graft: Secondary | ICD-10-CM

## 2018-09-01 DIAGNOSIS — I081 Rheumatic disorders of both mitral and tricuspid valves: Secondary | ICD-10-CM | POA: Diagnosis present

## 2018-09-01 DIAGNOSIS — Z6827 Body mass index (BMI) 27.0-27.9, adult: Secondary | ICD-10-CM

## 2018-09-01 DIAGNOSIS — Z95820 Peripheral vascular angioplasty status with implants and grafts: Secondary | ICD-10-CM

## 2018-09-01 DIAGNOSIS — Z8679 Personal history of other diseases of the circulatory system: Secondary | ICD-10-CM

## 2018-09-01 DIAGNOSIS — I480 Paroxysmal atrial fibrillation: Secondary | ICD-10-CM | POA: Diagnosis present

## 2018-09-01 DIAGNOSIS — E669 Obesity, unspecified: Secondary | ICD-10-CM | POA: Diagnosis present

## 2018-09-01 DIAGNOSIS — K219 Gastro-esophageal reflux disease without esophagitis: Secondary | ICD-10-CM | POA: Diagnosis present

## 2018-09-01 DIAGNOSIS — Z86718 Personal history of other venous thrombosis and embolism: Secondary | ICD-10-CM

## 2018-09-01 DIAGNOSIS — Z7902 Long term (current) use of antithrombotics/antiplatelets: Secondary | ICD-10-CM

## 2018-09-01 DIAGNOSIS — I319 Disease of pericardium, unspecified: Secondary | ICD-10-CM | POA: Diagnosis present

## 2018-09-01 DIAGNOSIS — Z9841 Cataract extraction status, right eye: Secondary | ICD-10-CM

## 2018-09-01 DIAGNOSIS — Z8719 Personal history of other diseases of the digestive system: Secondary | ICD-10-CM

## 2018-09-01 DIAGNOSIS — Z8711 Personal history of peptic ulcer disease: Secondary | ICD-10-CM

## 2018-09-01 DIAGNOSIS — Z90710 Acquired absence of both cervix and uterus: Secondary | ICD-10-CM

## 2018-09-01 DIAGNOSIS — I471 Supraventricular tachycardia: Secondary | ICD-10-CM | POA: Diagnosis present

## 2018-09-01 DIAGNOSIS — Z79899 Other long term (current) drug therapy: Secondary | ICD-10-CM

## 2018-09-01 DIAGNOSIS — Z7982 Long term (current) use of aspirin: Secondary | ICD-10-CM

## 2018-09-01 DIAGNOSIS — Z961 Presence of intraocular lens: Secondary | ICD-10-CM | POA: Diagnosis present

## 2018-09-01 LAB — BASIC METABOLIC PANEL
ANION GAP: 11 (ref 5–15)
BUN: 10 mg/dL (ref 8–23)
CO2: 15 mmol/L — AB (ref 22–32)
Calcium: 9.7 mg/dL (ref 8.9–10.3)
Chloride: 115 mmol/L — ABNORMAL HIGH (ref 98–111)
Creatinine, Ser: 1.6 mg/dL — ABNORMAL HIGH (ref 0.44–1.00)
GFR calc Af Amer: 36 mL/min — ABNORMAL LOW (ref >60)
GFR, EST NON AFRICAN AMERICAN: 31 mL/min — AB (ref >60)
GLUCOSE: 129 mg/dL — AB (ref 70–99)
POTASSIUM: 4.4 mmol/L (ref 3.5–5.1)
Sodium: 141 mmol/L (ref 135–145)

## 2018-09-01 LAB — CBC
HEMATOCRIT: 31.9 % — AB (ref 36.0–46.0)
Hemoglobin: 9.2 g/dL — ABNORMAL LOW (ref 12.0–15.0)
MCH: 27.3 pg (ref 26.0–34.0)
MCHC: 28.8 g/dL — ABNORMAL LOW (ref 30.0–36.0)
MCV: 94.7 fL (ref 80.0–100.0)
PLATELETS: 370 10*3/uL (ref 150–400)
RBC: 3.37 MIL/uL — AB (ref 3.87–5.11)
RDW: 18.8 % — AB (ref 11.5–15.5)
WBC: 12.3 10*3/uL — ABNORMAL HIGH (ref 4.0–10.5)
nRBC: 0 % (ref 0.0–0.2)

## 2018-09-01 LAB — PROTIME-INR
INR: 1.16
Prothrombin Time: 14.7 seconds (ref 11.4–15.2)

## 2018-09-01 LAB — I-STAT TROPONIN, ED: TROPONIN I, POC: 0.22 ng/mL — AB (ref 0.00–0.08)

## 2018-09-01 MED ORDER — ACYCLOVIR 400 MG PO TABS
400.0000 mg | ORAL_TABLET | Freq: Two times a day (BID) | ORAL | Status: DC
  Administered 2018-09-02 – 2018-09-03 (×4): 400 mg via ORAL
  Filled 2018-09-01 (×4): qty 1

## 2018-09-01 MED ORDER — ACETAMINOPHEN 325 MG PO TABS: 650.0000 mg | ORAL_TABLET | ORAL | Status: DC | PRN

## 2018-09-01 MED ORDER — TRAMADOL HCL 50 MG PO TABS
50.0000 mg | ORAL_TABLET | Freq: Two times a day (BID) | ORAL | Status: DC
  Administered 2018-09-02 – 2018-09-03 (×4): 50 mg via ORAL
  Filled 2018-09-01 (×5): qty 1

## 2018-09-01 MED ORDER — ASPIRIN EC 81 MG PO TBEC
81.0000 mg | DELAYED_RELEASE_TABLET | Freq: Every day | ORAL | Status: DC
  Administered 2018-09-02 – 2018-09-03 (×2): 81 mg via ORAL
  Filled 2018-09-01 (×2): qty 1

## 2018-09-01 MED ORDER — SODIUM CHLORIDE 0.9 % IV SOLN
INTRAVENOUS | Status: DC
  Administered 2018-09-01 (×2): via INTRAVENOUS

## 2018-09-01 MED ORDER — NITROGLYCERIN 0.4 MG SL SUBL
0.4000 mg | SUBLINGUAL_TABLET | SUBLINGUAL | Status: AC | PRN
  Administered 2018-09-01 (×3): 0.4 mg via SUBLINGUAL
  Filled 2018-09-01 (×2): qty 1

## 2018-09-01 MED ORDER — TICAGRELOR 60 MG PO TABS
60.0000 mg | ORAL_TABLET | Freq: Two times a day (BID) | ORAL | Status: DC
  Administered 2018-09-02 – 2018-09-03 (×4): 60 mg via ORAL
  Filled 2018-09-01 (×4): qty 1

## 2018-09-01 MED ORDER — HEPARIN BOLUS VIA INFUSION
4000.0000 [IU] | Freq: Once | INTRAVENOUS | Status: AC
  Administered 2018-09-01: 4000 [IU] via INTRAVENOUS
  Filled 2018-09-01: qty 4000

## 2018-09-01 MED ORDER — ONDANSETRON HCL 4 MG/2ML IJ SOLN
4.0000 mg | Freq: Once | INTRAMUSCULAR | Status: AC
  Administered 2018-09-01: 4 mg via INTRAVENOUS
  Filled 2018-09-01: qty 2

## 2018-09-01 MED ORDER — MORPHINE SULFATE (PF) 2 MG/ML IV SOLN
2.0000 mg | Freq: Once | INTRAVENOUS | Status: AC
  Administered 2018-09-01: 2 mg via INTRAVENOUS
  Filled 2018-09-01: qty 1

## 2018-09-01 MED ORDER — HYDRALAZINE HCL 25 MG PO TABS
12.5000 mg | ORAL_TABLET | Freq: Two times a day (BID) | ORAL | Status: DC
  Administered 2018-09-02 – 2018-09-03 (×4): 12.5 mg via ORAL
  Filled 2018-09-01 (×4): qty 1

## 2018-09-01 MED ORDER — PANTOPRAZOLE SODIUM 40 MG PO TBEC
40.0000 mg | DELAYED_RELEASE_TABLET | Freq: Every day | ORAL | Status: DC
  Administered 2018-09-02 – 2018-09-03 (×2): 40 mg via ORAL
  Filled 2018-09-01 (×2): qty 1

## 2018-09-01 MED ORDER — FERROUS SULFATE 325 (65 FE) MG PO TABS
325.0000 mg | ORAL_TABLET | Freq: Every day | ORAL | Status: DC
  Administered 2018-09-03: 325 mg via ORAL
  Filled 2018-09-01 (×2): qty 1

## 2018-09-01 MED ORDER — PROMETHAZINE HCL 25 MG/ML IJ SOLN
12.5000 mg | Freq: Once | INTRAMUSCULAR | Status: AC
  Administered 2018-09-01: 12.5 mg via INTRAVENOUS
  Filled 2018-09-01: qty 1

## 2018-09-01 MED ORDER — ATORVASTATIN CALCIUM 80 MG PO TABS
80.0000 mg | ORAL_TABLET | Freq: Every day | ORAL | Status: DC
  Administered 2018-09-02 (×2): 80 mg via ORAL
  Filled 2018-09-01: qty 1
  Filled 2018-09-01: qty 4
  Filled 2018-09-01: qty 1

## 2018-09-01 MED ORDER — CALCIUM CARBONATE 1250 (500 CA) MG PO TABS
1.0000 | ORAL_TABLET | Freq: Every day | ORAL | Status: DC
  Administered 2018-09-03: 500 mg via ORAL
  Filled 2018-09-01 (×2): qty 1

## 2018-09-01 MED ORDER — ADENOSINE 6 MG/2ML IV SOLN
6.0000 mg | Freq: Once | INTRAVENOUS | Status: DC
  Filled 2018-09-01: qty 2

## 2018-09-01 MED ORDER — HEPARIN (PORCINE) 25000 UT/250ML-% IV SOLN
750.0000 [IU]/h | INTRAVENOUS | Status: DC
  Administered 2018-09-01: 900 [IU]/h via INTRAVENOUS
  Filled 2018-09-01: qty 250

## 2018-09-01 MED ORDER — TIZANIDINE HCL 4 MG PO TABS
4.0000 mg | ORAL_TABLET | Freq: Every day | ORAL | Status: DC
  Administered 2018-09-02 (×2): 4 mg via ORAL
  Filled 2018-09-01 (×2): qty 1

## 2018-09-01 MED ORDER — PYRIDOSTIGMINE BROMIDE 60 MG PO TABS
30.0000 mg | ORAL_TABLET | Freq: Two times a day (BID) | ORAL | Status: DC
  Administered 2018-09-02 – 2018-09-03 (×4): 30 mg via ORAL
  Filled 2018-09-01 (×4): qty 0.5

## 2018-09-01 MED ORDER — ASPIRIN 81 MG PO CHEW: 324.0000 mg | CHEWABLE_TABLET | ORAL | Status: AC

## 2018-09-01 MED ORDER — ONDANSETRON HCL 4 MG/2ML IJ SOLN
4.0000 mg | Freq: Four times a day (QID) | INTRAMUSCULAR | Status: DC | PRN
  Administered 2018-09-02: 4 mg via INTRAVENOUS
  Filled 2018-09-01: qty 2

## 2018-09-01 MED ORDER — ASPIRIN 300 MG RE SUPP: 300.0000 mg | RECTAL | Status: AC

## 2018-09-01 MED ORDER — MORPHINE SULFATE (PF) 4 MG/ML IV SOLN
4.0000 mg | Freq: Once | INTRAVENOUS | Status: AC
  Administered 2018-09-01: 4 mg via INTRAVENOUS
  Filled 2018-09-01: qty 1

## 2018-09-01 NOTE — Progress Notes (Signed)
Patient arrived to unit with left side, stabbing chest pain 8/10, one sublingual nitroglycerin given. Patient also complaining of nausea and has had emesis x2 since arrival to unit also having hiccups.  Post sublingual nitroglycerin patient rating pain 6/10.  Patent had two sublingual nitroglycerin tablets in emergency department, with this RNs' administration of sublingual nitroglycerin, order complete.  EKG obtained.  2L nasal cannula applied for comfort.  Patient had 4mg  IV push Zofran at [facility].  Dr. Doylene Canard paged, returned page and was updated of all information in this note.  New orders received.

## 2018-09-01 NOTE — Progress Notes (Signed)
[facility] for heparin Indication: chest pain/ACS  Heparin Dosing Weight: 73 kg  Labs: No results for input(s): HGB, HCT, PLT, APTT, LABPROT, INR, HEPARINUNFRC, HEPRLOWMOCWT, CREATININE, CKTOTAL, CKMB, TROPONINI in the last 72 hours.  Assessment: 15 yof with hx of afib, DVT presenting with CP, SVT. Pharmacy consulted to dose heparin. Not on anticoagulation PTA. CBC pending - last from 11/7: Hg 8.8 stable, plt wnl. No bleed issues documented.  Goal of Therapy:  Heparin level 0.3-0.7 units/ml Monitor platelets by anticoagulation protocol: Yes   Plan:  Heparin 4000 unit bolus Start heparin at 900 units/h 8h heparin level Daily heparin level/CBC Monitor s/sx bleeding  Elicia Lamp, PharmD, BCPS Clinical Pharmacist 09/01/2018 3:16 PM

## 2018-09-01 NOTE — ED Notes (Signed)
Doylene Canard MD made aware re pt's chest pain and nausea.  Attempted to administer Tramadol PO as ordered by pt was actively dry heaving.  VORB for Morphine for pain received and administered.

## 2018-09-01 NOTE — ED Notes (Signed)
Attempted to call report to 6E, receiving nurse will return call.

## 2018-09-01 NOTE — ED Triage Notes (Addendum)
Per EMS pt is from home.  Pt sister called out today due to the pt having chest pain.  Pt apperared to be in SVT 170s.  Pt complains of left sided CP 7/10 AOx4 but EMS did state she is becoming lethargic. Pt was given 325 of Asprin by EMS

## 2018-09-01 NOTE — ED Provider Notes (Signed)
[facility] EMERGENCY DEPARTMENT Provider Note   CSN: [id number] Arrival date & time: 09/01/18  1437     History   Chief Complaint Chief Complaint  Patient presents with  . Chest Pain  . Tachycardia    HPI Abigail [name] is a 72 y.o. female.  HPI  The patient is a 72 year old female, she has a history of acute coronary syndrome, history of paroxysmal atrial fibrillation, she also has a history of anemia, chronic kidney disease and is currently being treated with antiplatelet agent, Brilinta, aspirin, long-acting nitrates.  She presents with acute onset of chest discomfort that started somewhere between 1130 and noon, she was eating lunch, she had been out paying her bills this morning out of the house and doing well.  The patient does not give much in the way of history, she seems reserved, she does complain of nausea and vomiting and chest pain which is mid to left side of her chest which is gradually worsened and was associated with severe tachycardia per the paramedics who gave her Zofran.  The patient does have a history of myocardial infarction twice in the past.  Primary cardiologist is Dr. Doylene Canard  Past Medical History:  Diagnosis Date  . ACS (acute coronary syndrome) ([place]) 06/2017  . Anemia    "off and on all my life" (12/22/2016)  . Anxiety   . Bilateral renal artery stenosis ([place]) 1999   s/p stenting  . Chronic kidney disease (CKD), stage III (moderate) ([place])    Archie Endo 12/22/2016  . Coronary artery disease 1999  . Depression   . Diverticulosis   . Duodenitis   . DVT (deep venous thrombosis) ([facility])    on Coumadin  . Dyspnea   . Gastric erosions   . Gastric ulcer   . GERD (gastroesophageal reflux disease)   . Heart murmur   . History of blood transfusion ~ 03/2016   "low HgB; practically nonexistent"  . Hyperlipidemia   . Hypertension   . MI (myocardial infarction) ([place]) 1999  . Multiple myeloma ([place]) dx'd ~ 04/2016  . PAT (paroxysmal atrial  tachycardia) ([place]) 12/22/2016  . Retinal hemorrhage, right eye   . Schatzki's ring   . Tachy-brady syndrome ([place])    Archie Endo 12/22/2016  . Tubular adenoma of colon     Patient Active Problem List   Diagnosis Date Noted  . Multiple myeloma in relapse ([place]) 06/15/2018  . Goals of care, counseling/discussion 06/15/2018  . Hematochezia   . Acute blood loss anemia   . Diverticulosis of colon with hemorrhage   . GIB (gastrointestinal bleeding) 09/03/2017  . Symptomatic anemia 09/03/2017  . Acute systolic heart failure ([place]) 09/03/2017  . Chronic kidney disease (CKD), stage III (moderate) ([place]) 09/03/2017  . Hyperlipidemia 09/03/2017  . Hypertension 09/03/2017  . Multiple myeloma in remission ([place]) 09/03/2017  . GI bleed 08/17/2017  . Sepsis ([place]) 08/17/2017  . Fever 08/16/2017  . STEMI (ST elevation myocardial infarction) ([place]) 02/24/2017  . Acute anterolateral wall MI ([place]) 02/24/2017  . Dehydration 01/21/2017  . Acute coronary syndrome ([place]) 12/24/2016  . Dizziness 12/22/2016    Class: Acute  . Orthostatic hypotension 12/08/2016  . Syncope and collapse 12/06/2016  . Tachycardia-bradycardia ([place])   . Essential hypertension   . Nausea and vomiting in adult patient   . Erosive esophagitis   . Protein-calorie malnutrition, severe 11/19/2016  . LLQ pain   . LUQ abdominal pain   . Intractable nausea and vomiting 11/17/2016  . Abdominal pain  11/05/2016  . Multiple myeloma ([place]) 06/18/2016  . Malnutrition of moderate degree 06/14/2016  . 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 Exertional dyspnea 11/01/2015  . Coronary atherosclerosis of native coronary artery 11/01/2015  . Shortness of breath 11/01/2015  . Chest pain 12/31/2011  . Coronary artery disease 10/20/1997    Past Surgical History:  Procedure Laterality Date  . APPENDECTOMY    . BACK SURGERY    . CATARACT EXTRACTION W/ INTRAOCULAR LENS IMPLANT Right 2014  . COLONOSCOPY WITH PROPOFOL  N/A 09/04/2017   Procedure: COLONOSCOPY WITH PROPOFOL;  Surgeon: Doran Stabler, MD;  Location: [facility];  Service: Gastroenterology;  Laterality: N/A;  . CORONARY ANGIOGRAPHY N/A 01/23/2017   Procedure: Coronary Angiography;  Surgeon: [name], MD;  Location: [facility];  Service: Cardiovascular;  Laterality: N/A;  . CORONARY ANGIOPLASTY WITH STENT PLACEMENT  1999  . CORONARY STENT INTERVENTION N/A 12/29/2016   Procedure: Coronary Stent Intervention;  Surgeon: Charolette Forward, MD;  Location: [facility];  Service: Cardiovascular;  Laterality: N/A;  . CORONARY STENT INTERVENTION N/A 02/24/2017   Procedure: Coronary Stent Intervention;  Surgeon: Charolette Forward, MD;  Location: [facility];  Service: Cardiovascular;  Laterality: N/A;  . ESOPHAGOGASTRODUODENOSCOPY N/A 11/03/2015   Procedure: ESOPHAGOGASTRODUODENOSCOPY (EGD);  Surgeon: Carol Ada, MD;  Location: [facility];  Service: Endoscopy;  Laterality: N/A;  . ESOPHAGOGASTRODUODENOSCOPY N/A 09/03/2017   Procedure: ESOPHAGOGASTRODUODENOSCOPY (EGD);  Surgeon: Doran Stabler, MD;  Location: [facility];  Service: Gastroenterology;  Laterality: N/A;  . ESOPHAGOGASTRODUODENOSCOPY (EGD) WITH PROPOFOL N/A 11/18/2016   Procedure: ESOPHAGOGASTRODUODENOSCOPY (EGD) WITH PROPOFOL;  Surgeon: Ladene Artist, MD;  Location: [facility];  Service: Endoscopy;  Laterality: N/A;  . LEFT HEART CATH AND CORONARY ANGIOGRAPHY N/A 12/24/2016   Procedure: Left Heart Cath and Coronary Angiography;  Surgeon: [name], MD;  Location: [facility];  Service: Cardiovascular;  Laterality: N/A;  . LEFT HEART CATH AND CORONARY ANGIOGRAPHY N/A 02/24/2017   Procedure: Left Heart Cath and Coronary Angiography;  Surgeon: Charolette Forward, MD;  Location: [facility];  Service: Cardiovascular;  Laterality: N/A;  . LEFT HEART CATH AND CORONARY ANGIOGRAPHY N/A 03/05/2017   Procedure: Left Heart Cath and Coronary Angiography;  Surgeon:  [name], MD;  Location: [facility];  Service: Cardiovascular;  Laterality: N/A;  . LUMBAR [place]    . RENAL ARTERY STENT  1999   bil RAS so ? bil vs unilateral stents  . RETINAL LASER PROCEDURE Right 2012   "bleeding"  . TONSILLECTOMY    . TOTAL ABDOMINAL HYSTERECTOMY       OB History   None      Home Medications    Prior to Admission medications   Medication Sig Start Date End Date Taking? Authorizing Provider  acyclovir (ZOVIRAX) 400 MG tablet TAKE 1 TABLET (400 MG TOTAL) BY MOUTH 2 (TWO) TIMES DAILY. 08/29/16   Ladell Pier, MD  Ascorbic Acid (VITAMIN C) 1000 MG tablet Take 1,000 mg by mouth daily.     [provider]  aspirin EC 81 MG tablet Take 1 tablet (81 mg total) by mouth daily. 08/21/17   Bonnielee Haff, MD  atorvastatin (LIPITOR) 80 MG tablet Take 1 tablet (80 mg total) by mouth daily at 6 PM. 03/01/17   [name], MD  calcium carbonate (OS-CAL - DOSED IN MG OF ELEMENTAL CALCIUM) 1250 (500 Ca) MG tablet Take 1 tablet by mouth daily  with breakfast.    [provider]  Cholecalciferol (VITAMIN D PO) Take 1 capsule by mouth daily.    [provider]  cyclophosphamide (CYTOXAN) 50 MG capsule Take 10 capsules (562m) by mouth once weekly for 3 weeks on, 1 week off, repeat every 4 weeks. Take early in the day. Maintain hydration. 08/10/18   SLadell Pier MD  dexamethasone (DECADRON) 4 MG tablet Take 5 tabs (20 mg) on 9/5, 9/12 and 9/19 07/08/18   SLadell Pier MD  ferrous sulfate 325 (65 FE) MG tablet Take 1 tablet (325 mg total) by mouth daily with breakfast. 03/02/17   KDixie Dials MD  furosemide (LASIX) 40 MG tablet TAKE 1 TABLET (40 MG TOTAL) BY MOUTH DAILY AS NEEDED FOR FLUID 09/18/17   [provider]  hydrALAZINE (APRESOLINE) 25 MG tablet Take 1 tablet (25 mg total) by mouth 2 (two) times daily. Patient taking differently: Take 12.5 mg by mouth 2 (two) times daily.  12/17/17   KDixie Dials MD    ibuprofen (ADVIL,MOTRIN) 200 MG tablet Take 200 mg by mouth every 6 (six) hours as needed.    [provider]  nitroGLYCERIN (NITROSTAT) 0.4 MG SL tablet Place 0.4 mg under the tongue every 5 (five) minutes as needed for chest pain. 03/20/17   [provider]  pantoprazole (PROTONIX) 40 MG tablet Take 1 tablet (40 mg total) by mouth daily. 03/12/17   KDixie Dials MD  potassium chloride SA (K-DUR,KLOR-CON) 20 MEQ tablet Take 1 tablet twice daily for 3 days, then 1 tablet daily 06/24/18   SLadell Pier MD  pyridostigmine (MESTINON) 60 MG tablet Take 0.5 tablets (30 mg total) by mouth 2 (two) times daily. 01/25/17   KDixie Dials MD  ticagrelor (BRILINTA) 60 MG TABS tablet Take 60 mg 2 (two) times daily by mouth.    [provider]  tiZANidine (ZANAFLEX) 4 MG tablet Take 4 mg by mouth 2 (two) times daily as needed. 04/06/18   [provider]  traMADol (ULTRAM) 50 MG tablet Take 1 tablet by mouth every 6 (six) hours as needed. 04/06/18   [provider]  ISOSORBIDE DINITRATE PO Take by mouth.  12/30/11  [provider]    Family History Family History  Problem Relation Age of Onset  . Breast cancer Mother   . Breast cancer Sister   . Breast cancer Maternal Aunt   . Colon cancer Neg Hx     Social History Social History   Tobacco Use  . Smoking status: Former Smoker    Packs/day: 0.50    Years: 50.00    Pack years: 25.00    Types: Cigarettes    Last attempt to quit: 11/20/2016    Years since quitting: 1.7  . Smokeless tobacco: Never Used  Substance Use Topics  . Alcohol use: No    Alcohol/week: 0.0 standard drinks  . Drug use: No     Allergies   Sulfa antibiotics   Review of Systems Review of Systems  All other systems reviewed and are negative.    Physical Exam Updated Vital Signs Ht 1.651 m ('5\' 5"' )   Wt 77.1 kg   SpO2 98%   BMI 28.29 kg/m   Physical Exam  Constitutional: She appears well-developed and  well-nourished. She appears ill. She appears distressed.  HENT:  Head: Normocephalic and atraumatic.  Mouth/Throat: No oropharyngeal exudate.  Pale mucous membranes  Eyes: Pupils are equal, round, and reactive to light. EOM are normal. Right eye  exhibits no discharge. Left eye exhibits no discharge. No scleral icterus.  Pale conjunctiva  Neck: Normal range of motion. Neck supple. No JVD present. No thyromegaly present.  No JVD  Cardiovascular: Normal heart sounds and intact distal pulses. Exam reveals no gallop and no friction rub.  No murmur heard. Initial rhythm was regular narrow complex tachycardia at approximately 165 bpm, pulses were palpable  Pulmonary/Chest: Effort normal and breath sounds normal. No respiratory distress. She has no wheezes. She has no rales.  Abdominal: Soft. Bowel sounds are normal. She exhibits no distension and no mass. There is no tenderness.  Musculoskeletal: Normal range of motion. She exhibits no edema or tenderness.  No edema  Lymphadenopathy:    She has no cervical adenopathy.  Neurological: She is alert. Coordination normal.  Skin: Skin is warm and dry. No rash noted. No erythema.  Psychiatric: She has a normal mood and affect. Her behavior is normal.  Nursing note and vitals reviewed.    ED Treatments / Results  Labs (all labs ordered are listed, but only abnormal results are displayed) Labs Reviewed - No data to display  EKG EKG Interpretation  Date/Time:  Wednesday September 01 2018 14:51:04 EST Ventricular Rate:  167 PR Interval:    QRS Duration: 84 QT Interval:  300 QTC Calculation: 500 R Axis:   40 Text Interpretation:  Supraventricular tachycardia Anteroseptal infarct, old Repolarization abnormality, prob rate related Baseline wander in lead(s) III aVL V1 V2 Since last tracing Normal sinus rhythm replaced wtih SVT Confirmed by Noemi Chapel [phone]) on 09/01/2018 3:11:38 PM   EKG Interpretation  Date/Time:  Wednesday September 01 2018 15:04:52 EST Ventricular Rate:  59 PR Interval:    QRS Duration: 105 QT Interval:  372 QTC Calculation: 369 R Axis:   68 Text Interpretation:  Atrial fibrillation Anteroseptal infarct, age indeterminate Lateral leads are also involved Since last tracing ST depressions visible and question sslight ST elevation in avL PVC's present Confirmed by Noemi Chapel [phone]) on 09/01/2018 3:15:37 PM       EKG Interpretation  Date/Time:  Wednesday September 01 2018 16:42:58 EST Ventricular Rate:  61 PR Interval:    QRS Duration: 93 QT Interval:  470 QTC Calculation: 474 R Axis:   10 Text Interpretation:  Sinus rhythm Ventricular premature complex Anteroseptal infarct, age indeterminate since last tracing no significant change Abnormal ekg Confirmed by Noemi Chapel [phone]) on 09/01/2018 4:45:29 PM         Radiology No results found.  Procedures .Critical Care Performed by: Noemi Chapel, MD Authorized by: Noemi Chapel, MD   Critical care provider statement:    Critical care time (minutes):  35   Critical care time was exclusive of:  Separately billable procedures and treating other patients and teaching time   Critical care was necessary to treat or prevent imminent or life-threatening deterioration of the following conditions:  Cardiac failure   Critical care was time spent personally by me on the following activities:  Blood draw for specimens, development of treatment plan with patient or surrogate, discussions with consultants, evaluation of patient's response to treatment, examination of patient, obtaining history from patient or surrogate, ordering and performing treatments and interventions, ordering and review of laboratory studies, ordering and review of radiographic studies, pulse oximetry, re-evaluation of patient's condition and review of old charts   (including critical care time)  Medications Ordered in ED Medications  adenosine (ADENOCARD) 6 MG/2ML injection 6 mg  (has no administration in time range)  Initial Impression / Assessment and Plan / ED Course  I have reviewed the triage vital signs and the nursing notes.  Pertinent labs & imaging results that were available during my care of the patient were reviewed by me and considered in my medical decision making (see chart for details).     The patient is ill-appearing, very tachycardic and having ongoing active chest pain.  She was given aspirin prehospital.  The patient was informed that her symptoms may be related to cardiac causes including ischemia, less likely to be pulmonary embolism, during the exam she spontaneously converted into what appeared to be a normal rhythm, EKG was repeated, SVT had resolved, there was some slight ST depressions, no ST elevations.  The patient will be given nitroglycerin, started on heparin, will discuss with cardiology.  She is not on any beta-blocker or calcium channel blocker, she will likely need to have this though I am hesitant to give it acutely in the setting of her slight bradycardia and potential ischemic chest pain.  The patient is critically ill, critical care services provided.  The patient is remained nauseated with slight chest pain, given nitroglycerin, aspirin, heparin, troponin comes back elevated, discussed with the cardiologist Dr. Doylene Canard who will admit.   Final Clinical Impressions(s) / ED Diagnoses   Final diagnoses:  NSTEMI (non-ST elevated myocardial infarction) ([place])  SVT (supraventricular tachycardia) ([facility])      Noemi Chapel, MD 09/01/18 1819

## 2018-09-01 NOTE — ED Provider Notes (Signed)
Angiocath insertion Performed by: Ephraim Hamburger  Consent: Verbal consent obtained. Risks and benefits: risks, benefits and alternatives were discussed Time out: Immediately prior to procedure a "time out" was called to verify the correct patient, procedure, equipment, support staff and site/side marked as required.  Preparation: Patient was prepped and draped in the usual sterile fashion.  Vein Location: right EJ  Gauge: 18  Normal blood return and flush without difficulty Patient tolerance: Patient tolerated the procedure well with no immediate complications.      Sherwood Gambler, MD 09/01/18 [phone]

## 2018-09-01 NOTE — H&P (Signed)
Referring Physician: Lorelle Formosa is an 72 y.o. female.                       Chief Complaint: Chest pain  HPI: 72 year old female with PMH of multiple myeloma, LAD and LCX multiple stents, left leg DVT, obesity and chronic anemia with GI bleed has acute onset of chest pain, left sided associated with SVT with significant ST depression in inferior and lateral leads. She had spontaneous cardioversion to sinus rhythm with EKG showing Isolated PVC and non specific T changes.  Past Medical History:  Diagnosis Date  . ACS (acute coronary syndrome) ([place]) 06/2017  . Anemia    "off and on all my life" (12/22/2016)  . Anxiety   . Bilateral renal artery stenosis ([place]) 1999   s/p stenting  . Chronic kidney disease (CKD), stage III (moderate) ([place])    Archie Endo 12/22/2016  . Coronary artery disease 1999  . Depression   . Diverticulosis   . Duodenitis   . DVT (deep venous thrombosis) ([facility])    on Coumadin  . Dyspnea   . Gastric erosions   . Gastric ulcer   . GERD (gastroesophageal reflux disease)   . Heart murmur   . History of blood transfusion ~ 03/2016   "low HgB; practically nonexistent"  . Hyperlipidemia   . Hypertension   . MI (myocardial infarction) ([place]) 1999  . Multiple myeloma ([place]) dx'd ~ 04/2016  . PAT (paroxysmal atrial tachycardia) ([place]) 12/22/2016  . Retinal hemorrhage, right eye   . Schatzki's ring   . Tachy-brady syndrome ([place])    Archie Endo 12/22/2016  . Tubular adenoma of colon       Past Surgical History:  Procedure Laterality Date  . APPENDECTOMY    . BACK SURGERY    . CATARACT EXTRACTION W/ INTRAOCULAR LENS IMPLANT Right 2014  . COLONOSCOPY WITH PROPOFOL N/A 09/04/2017   Procedure: COLONOSCOPY WITH PROPOFOL;  Surgeon: Doran Stabler, MD;  Location: [facility];  Service: Gastroenterology;  Laterality: N/A;  . CORONARY ANGIOGRAPHY N/A 01/23/2017   Procedure: Coronary Angiography;  Surgeon: [name], MD;  Location: [facility];  Service:  Cardiovascular;  Laterality: N/A;  . CORONARY ANGIOPLASTY WITH STENT PLACEMENT  1999  . CORONARY STENT INTERVENTION N/A 12/29/2016   Procedure: Coronary Stent Intervention;  Surgeon: Charolette Forward, MD;  Location: [facility];  Service: Cardiovascular;  Laterality: N/A;  . CORONARY STENT INTERVENTION N/A 02/24/2017   Procedure: Coronary Stent Intervention;  Surgeon: Charolette Forward, MD;  Location: [facility];  Service: Cardiovascular;  Laterality: N/A;  . ESOPHAGOGASTRODUODENOSCOPY N/A 11/03/2015   Procedure: ESOPHAGOGASTRODUODENOSCOPY (EGD);  Surgeon: Carol Ada, MD;  Location: [facility] ENDOSCOPY;  Service: Endoscopy;  Laterality: N/A;  . ESOPHAGOGASTRODUODENOSCOPY N/A 09/03/2017   Procedure: ESOPHAGOGASTRODUODENOSCOPY (EGD);  Surgeon: Doran Stabler, MD;  Location: [facility];  Service: Gastroenterology;  Laterality: N/A;  . ESOPHAGOGASTRODUODENOSCOPY (EGD) WITH PROPOFOL N/A 11/18/2016   Procedure: ESOPHAGOGASTRODUODENOSCOPY (EGD) WITH PROPOFOL;  Surgeon: Ladene Artist, MD;  Location: [facility] ENDOSCOPY;  Service: Endoscopy;  Laterality: N/A;  . LEFT HEART CATH AND CORONARY ANGIOGRAPHY N/A 12/24/2016   Procedure: Left Heart Cath and Coronary Angiography;  Surgeon: [name], MD;  Location: [facility];  Service: Cardiovascular;  Laterality: N/A;  . LEFT HEART CATH AND CORONARY ANGIOGRAPHY N/A 02/24/2017   Procedure: Left Heart Cath and Coronary Angiography;  Surgeon: Charolette Forward, MD;  Location: [facility];  Service: Cardiovascular;  Laterality: N/A;  . LEFT HEART CATH AND CORONARY ANGIOGRAPHY N/A 03/05/2017   Procedure: Left Heart Cath and Coronary Angiography;  Surgeon: Kadakia, Ajay, MD;  Location: [facility];  Service: Cardiovascular;  Laterality: N/A;  . LUMBAR DISC SURGERY    . RENAL ARTERY STENT  1999   bil RAS so ? bil vs unilateral stents  . RETINAL LASER PROCEDURE Right 2012   "bleeding"  . TONSILLECTOMY    . TOTAL ABDOMINAL HYSTERECTOMY      Family  History  Problem Relation Age of Onset  . Breast cancer Mother   . Breast cancer Sister   . Breast cancer Maternal Aunt   . Colon cancer Neg Hx    Social History:  reports that she quit smoking about 21 months ago. Her smoking use included cigarettes. She has a 25.00 pack-year smoking history. She has never used smokeless tobacco. She reports that she does not drink alcohol or use drugs.  Allergies:  Allergies  Allergen Reactions  . Sulfa Antibiotics Rash     (Not in a hospital admission)  Results for orders placed or performed during the hospital encounter of 09/01/18 (from the past 48 hour(s))  CBC     Status: Abnormal   Collection Time: 09/01/18  5:15 PM  Result Value Ref Range   WBC 12.3 (H) 4.0 - 10.5 K/uL   RBC 3.37 (L) 3.87 - 5.11 MIL/uL   Hemoglobin 9.2 (L) 12.0 - 15.0 g/dL   HCT 31.9 (L) 36.0 - 46.0 %   MCV 94.7 80.0 - 100.0 fL   MCH 27.3 26.0 - 34.0 pg   MCHC 28.8 (L) 30.0 - 36.0 g/dL   RDW 18.8 (H) 11.5 - 15.5 %   Platelets 370 150 - 400 K/uL   nRBC 0.0 0.0 - 0.2 %    Comment: Performed at [facility], [address] [place], [place] [address]  Basic metabolic panel     Status: Abnormal   Collection Time: 09/01/18  5:15 PM  Result Value Ref Range   Sodium 141 135 - 145 mmol/L   Potassium 4.4 3.5 - 5.1 mmol/L   Chloride 115 (H) 98 - 111 mmol/L   CO2 15 (L) 22 - 32 mmol/L   Glucose, Bld 129 (H) 70 - 99 mg/dL   BUN 10 8 - 23 mg/dL   Creatinine, Ser 1.60 (H) 0.44 - 1.00 mg/dL   Calcium 9.7 8.9 - 10.3 mg/dL   GFR calc non Af Amer 31 (L) >60 mL/min   GFR calc Af Amer 36 (L) >60 mL/min    Comment: (NOTE) The eGFR has been calculated using the CKD EPI equation. This calculation has not been validated in all clinical situations. eGFR's persistently <60 mL/min signify possible Chronic Kidney Disease.    Anion gap 11 5 - 15    Comment: Performed at [facility], [address] [place], [place] [address]  Protime-INR     Status: None   Collection  Time: 09/01/18  5:15 PM  Result Value Ref Range   Prothrombin Time 14.7 11.4 - 15.2 seconds   INR 1.16     Comment: Performed at [facility], [address] [place], [place] [address]  I-Stat Troponin, ED (not at [facility])     Status: Abnormal   Collection Time: 09/01/18  5:25 PM  Result Value Ref Range   Troponin i, poc 0.22 (HH) 0.00 - 0.08 ng/mL   Comment   NOTIFIED PHYSICIAN    Comment 3            Comment: Due to the release kinetics of cTnI, a negative result within the first hours of the onset of symptoms does not rule out myocardial infarction with certainty. If myocardial infarction is still suspected, repeat the test at appropriate intervals.    Dg Chest Port 1 View  Result Date: 09/01/2018 CLINICAL DATA:  Short of breath chest pain, arrhythmia EXAM: PORTABLE CHEST 1 VIEW COMPARISON:  12/13/2017 FINDINGS: Cardiac enlargement. Mild vascular congestion without edema. Mild bibasilar atelectasis. Negative for pleural effusion. IMPRESSION: Cardiac enlargement with mild vascular congestion Mild bibasilar atelectasis. Electronically Signed   By: Franchot Gallo M.D.   On: 09/01/2018 16:36    Review Of Systems Constitutional: No fever, chills, weight loss or gain. Eyes: No vision change, wears glasses. No discharge or pain. Ears: No hearing loss, No tinnitus. Respiratory: No asthma, COPD, pneumonias. Positive shortness of breath. No hemoptysis. Cardiovascular: Positive chest pain, palpitation, leg edema. Gastrointestinal: Positive nausea, vomiting, no diarrhea, constipation. Positive GI bleed. No hepatitis. Genitourinary: No dysuria, hematuria, kidney stone. No incontinance. Neurological: Positive headache, no stroke, seizures.  Psychiatry: No psych facility admission for anxiety, depression, suicide. No detox. Skin: No rash. Musculoskeletal: Positive joint pain, fibromyalgia, neck pain, back pain. Lymphadenopathy: No lymphadenopathy. Hematology: Positive anemia, no easy  bruising.   Blood pressure (!) 160/88, pulse 62, resp. rate 15, height 5' 5" (1.651 m), weight 77.1 kg, SpO2 97 %. Body mass index is 28.29 kg/m. General appearance: alert, cooperative, appears stated age and no distress Head: Normocephalic, atraumatic. Eyes: Brown eyes, pale pink conjunctiva, corneas clear. PERRL, EOM's intact. Neck: No adenopathy, no carotid bruit, + JVD, supple, symmetrical, trachea midline and thyroid not enlarged. Resp: Basal crackles to auscultation bilaterally. Cardio: Regular rate and rhythm, S1, S2 normal, II/VI systolic murmur, no click, rub or gallop GI: Soft, non-tender; bowel sounds normal; no organomegaly. Extremities: Trace edema,no  cyanosis or clubbing. Skin: Warm and dry.  Neurologic: Alert and oriented X 3, normal strength. Normal coordination and slow gait.  Assessment/Plan Acute coronary syndrome Multi-vessel CAD Stent in LAD and LCX Multiple myeloma Anemia of chronic GI blood loss H/O DVT CKD, III Hypertension  Admit. IV heparin Cardiac cath in AM.  Birdie Riddle, MD  09/01/2018, 7:06 PM

## 2018-09-01 NOTE — ED Notes (Signed)
Pt heart rate dropped from 170 down to 57 ECG performed and given to EDP

## 2018-09-02 ENCOUNTER — Encounter (HOSPITAL_COMMUNITY): Payer: Self-pay | Admitting: General Practice

## 2018-09-02 ENCOUNTER — Inpatient Hospital Stay: Payer: Medicare HMO | Admitting: Nurse Practitioner

## 2018-09-02 ENCOUNTER — Inpatient Hospital Stay: Payer: Medicare HMO

## 2018-09-02 ENCOUNTER — Encounter (HOSPITAL_COMMUNITY)
Admission: EM | Disposition: A | Discharge status: Home / Self Care | Payer: Self-pay | Source: Home / Self Care | Attending: Cardiovascular Disease

## 2018-09-02 ENCOUNTER — Other Ambulatory Visit: Payer: Medicare HMO

## 2018-09-02 ENCOUNTER — Inpatient Hospital Stay (HOSPITAL_COMMUNITY): Payer: Medicare HMO

## 2018-09-02 ENCOUNTER — Other Ambulatory Visit: Payer: Self-pay

## 2018-09-02 HISTORY — PX: LEFT HEART CATH AND CORONARY ANGIOGRAPHY: CATH118249

## 2018-09-02 LAB — LIPID PANEL
CHOL/HDL RATIO: 2.2 ratio
Cholesterol: 143 mg/dL (ref 0–200)
HDL: 65 mg/dL (ref >40)
LDL Cholesterol: 65 mg/dL (ref 0–99)
Triglycerides: 66 mg/dL (ref <150)
VLDL: 13 mg/dL (ref 0–40)

## 2018-09-02 LAB — BASIC METABOLIC PANEL
ANION GAP: 12 (ref 5–15)
BUN: 10 mg/dL (ref 8–23)
CHLORIDE: 113 mmol/L — AB (ref 98–111)
CO2: 16 mmol/L — AB (ref 22–32)
Calcium: 9.2 mg/dL (ref 8.9–10.3)
Creatinine, Ser: 1.46 mg/dL — ABNORMAL HIGH (ref 0.44–1.00)
GFR calc Af Amer: 41 mL/min — ABNORMAL LOW (ref >60)
GFR calc non Af Amer: 35 mL/min — ABNORMAL LOW (ref >60)
GLUCOSE: 106 mg/dL — AB (ref 70–99)
POTASSIUM: 4.3 mmol/L (ref 3.5–5.1)
Sodium: 141 mmol/L (ref 135–145)

## 2018-09-02 LAB — PROTIME-INR
INR: 1.09
PROTHROMBIN TIME: 14 s (ref 11.4–15.2)

## 2018-09-02 LAB — CBC
HEMATOCRIT: 30.5 % — AB (ref 36.0–46.0)
Hemoglobin: 9.2 g/dL — ABNORMAL LOW (ref 12.0–15.0)
MCH: 27.9 pg (ref 26.0–34.0)
MCHC: 30.2 g/dL (ref 30.0–36.0)
MCV: 92.4 fL (ref 80.0–100.0)
PLATELETS: 344 10*3/uL (ref 150–400)
RBC: 3.3 MIL/uL — ABNORMAL LOW (ref 3.87–5.11)
RDW: 19.1 % — AB (ref 11.5–15.5)
WBC: 14.9 10*3/uL — AB (ref 4.0–10.5)
nRBC: 0.3 % — ABNORMAL HIGH (ref 0.0–0.2)

## 2018-09-02 LAB — TROPONIN I
TROPONIN I: 0.54 ng/mL — AB (ref <0.03)
Troponin I: 0.3 ng/mL (ref <0.03)
Troponin I: 0.46 ng/mL (ref <0.03)

## 2018-09-02 LAB — POCT ACTIVATED CLOTTING TIME: ACTIVATED CLOTTING TIME: 158 s

## 2018-09-02 LAB — HEPARIN LEVEL (UNFRACTIONATED)
HEPARIN UNFRACTIONATED: 0.74 [IU]/mL — AB (ref 0.30–0.70)
HEPARIN UNFRACTIONATED: 0.75 [IU]/mL — AB (ref 0.30–0.70)

## 2018-09-02 SURGERY — LEFT HEART CATH AND CORONARY ANGIOGRAPHY
Anesthesia: LOCAL

## 2018-09-02 MED ORDER — MORPHINE SULFATE (PF) 2 MG/ML IV SOLN
2.0000 mg | INTRAVENOUS | Status: DC | PRN
  Administered 2018-09-02: 2 mg via INTRAVENOUS
  Filled 2018-09-02: qty 1

## 2018-09-02 MED ORDER — MIDAZOLAM HCL 2 MG/2ML IJ SOLN
INTRAMUSCULAR | Status: DC | PRN
  Administered 2018-09-02: 1 mg via INTRAVENOUS

## 2018-09-02 MED ORDER — FENTANYL CITRATE (PF) 100 MCG/2ML IJ SOLN
INTRAMUSCULAR | Status: AC
  Filled 2018-09-02: qty 2

## 2018-09-02 MED ORDER — MIDAZOLAM HCL 2 MG/2ML IJ SOLN
INTRAMUSCULAR | Status: AC
  Filled 2018-09-02: qty 2

## 2018-09-02 MED ORDER — IOHEXOL 350 MG/ML SOLN
INTRAVENOUS | Status: DC | PRN
  Administered 2018-09-02: 45 mL via INTRA_ARTERIAL

## 2018-09-02 MED ORDER — SODIUM CHLORIDE 0.9% FLUSH
3.0000 mL | Freq: Two times a day (BID) | INTRAVENOUS | Status: DC
  Administered 2018-09-02: 3 mL via INTRAVENOUS

## 2018-09-02 MED ORDER — LIDOCAINE HCL (PF) 1 % IJ SOLN
INTRAMUSCULAR | Status: AC
  Filled 2018-09-02: qty 30

## 2018-09-02 MED ORDER — HEPARIN (PORCINE) IN NACL 1000-0.9 UT/500ML-% IV SOLN
INTRAVENOUS | Status: DC | PRN
  Administered 2018-09-02 (×2): 500 mL

## 2018-09-02 MED ORDER — SODIUM CHLORIDE 0.9 % IV SOLN: INTRAVENOUS | Status: AC

## 2018-09-02 MED ORDER — OXYCODONE HCL 5 MG PO TABS: 5.0000 mg | ORAL_TABLET | ORAL | Status: DC | PRN

## 2018-09-02 MED ORDER — MORPHINE SULFATE (PF) 2 MG/ML IV SOLN
INTRAVENOUS | Status: AC
  Administered 2018-09-02: 2 mg via INTRAVENOUS
  Filled 2018-09-02: qty 1

## 2018-09-02 MED ORDER — SODIUM CHLORIDE 0.9 % IV SOLN: 250.0000 mL | INTRAVENOUS | Status: DC | PRN

## 2018-09-02 MED ORDER — LIDOCAINE HCL (PF) 1 % IJ SOLN
INTRAMUSCULAR | Status: DC | PRN
  Administered 2018-09-02: 12 mL via SUBCUTANEOUS

## 2018-09-02 MED ORDER — HEPARIN (PORCINE) IN NACL 1000-0.9 UT/500ML-% IV SOLN
INTRAVENOUS | Status: AC
  Filled 2018-09-02: qty 1000

## 2018-09-02 MED ORDER — SODIUM CHLORIDE 0.9% FLUSH: 3.0000 mL | INTRAVENOUS | Status: DC | PRN

## 2018-09-02 MED ORDER — FENTANYL CITRATE (PF) 100 MCG/2ML IJ SOLN
INTRAMUSCULAR | Status: DC | PRN
  Administered 2018-09-02: 25 ug via INTRAVENOUS

## 2018-09-02 SURGICAL SUPPLY — 7 items
CATH INFINITI 5 FR 3DRC (CATHETERS) ×1 IMPLANT
CATH INFINITI 5FR MULTPACK ANG (CATHETERS) ×1 IMPLANT
KIT HEART LEFT (KITS) ×2 IMPLANT
PACK CARDIAC CATHETERIZATION (CUSTOM PROCEDURE TRAY) ×2 IMPLANT
SHEATH PINNACLE 5F 10CM (SHEATH) ×1 IMPLANT
TRANSDUCER W/STOPCOCK (MISCELLANEOUS) ×2 IMPLANT
WIRE EMERALD 3MM-J .035X150CM (WIRE) ×1 IMPLANT

## 2018-09-02 NOTE — Progress Notes (Signed)
Patient complained of chest pain 8/10, same chest pain patient has been having.  Dr. Doylene Canard paged.  Dr. Doylene Canard returned page and was updated, new order received.

## 2018-09-02 NOTE — Progress Notes (Addendum)
[facility] for heparin Indication: chest pain/ACS  Heparin Dosing Weight: 73 kg  Labs: Recent Labs    09/01/18 1715 09/01/18 2003 09/02/18 0152  HGB 9.2*  --   --   HCT 31.9*  --   --   PLT 370  --   --   LABPROT 14.7  --   --   INR 1.16  --   --   HEPARINUNFRC  --   --  0.74*  CREATININE 1.60*  --   --   TROPONINI  --  0.30* 0.46*    Assessment: 86 yof with hx of afib, DVT presenting with CP, SVT. Pharmacy consulted to dose heparin. Not on anticoagulation PTA. CBC pending - last from 11/7: Hg 8.8 stable, plt wnl. No bleed issues documented. Initial heparin level 0.74 units/ml Goal of Therapy:  Heparin level 0.3-0.7 units/ml Monitor platelets by anticoagulation protocol: Yes   Plan:  Decrease heparin to 850 units/h 8h heparin level Daily heparin level/CBC Monitor s/sx bleeding  Excell Seltzer, PharmD Clinical Pharmacist 09/02/2018 3:53 AM   Addum:  HL 0.75, dec heparin to 750 units/hr.  Check 6 hr HL

## 2018-09-02 NOTE — Progress Notes (Signed)
*  PRELIMINARY RESULTS* Echocardiogram 2D Echocardiogram has been performed.  Abigail [name] 09/02/2018, 2:16 PM

## 2018-09-02 NOTE — Progress Notes (Addendum)
Site area: RFA Site Prior to Removal:  Level 0 Pressure Applied For:20 min Manual:   yes Patient Status During Pull:  stable Post Pull Site:  Level 0 Post Pull Instructions Given:  yes Post Pull Pulses Present: palpable Dressing Applied:  clear Bedrest begins @ 5848 till 1350 Comments: removed by Dalbert Batman

## 2018-09-02 NOTE — Progress Notes (Signed)
Ref: Nolene Ebbs, MD   Subjective:  Chest pain all night. Partially improving with NTG and morphine.   Objective:  Vital Signs in the last 24 hours: Temp:  [98.5 F (36.9 C)-99.4 F (37.4 C)] 99.4 F (37.4 C) (11/14 0427) Pulse Rate:  [61-166] 72 (11/14 0427) Cardiac Rhythm: Normal sinus rhythm (11/13 2135) Resp:  [11-20] 14 (11/14 0021) BP: (145-187)/(64-98) 169/78 (11/14 0427) SpO2:  [94 %-99 %] 96 % (11/14 0427) Weight:  [74.9 kg-77.1 kg] 75 kg (11/14 0427)  Physical Exam: BP Readings from Last 1 Encounters:  09/02/18 (!) 169/78     Wt Readings from Last 1 Encounters:  09/02/18 75 kg    Weight change:  Body mass index is 27.1 kg/m. HEENT: [place]/AT, Eyes-Brown, PERL, EOMI, Conjunctiva-Pale, Sclera-Non-icteric Neck: No JVD, No bruit, Trachea midline. Lungs:  Clear, Bilateral. Cardiac:  Regular rhythm, normal S1 and S2, no S3. II/VI systolic murmur. Abdomen:  Soft, non-tender. BS present. Extremities:  No edema present. No cyanosis. No clubbing. CNS: AxOx3, Cranial nerves grossly intact, moves all 4 extremities.  Skin: Warm and dry.   Intake/Output from previous day: 11/13 0701 - 11/14 0700 In: -  Out: 425 [Urine:425]    Lab Results: BMET    Component Value Date/Time   NA 141 09/02/2018 0630   NA 141 09/01/2018 1715   NA 140 08/26/2018 1402   NA 144 09/17/2017 1429   NA 141 07/30/2017 1504   NA 142 06/11/2017 1451   K 4.3 09/02/2018 0630   K 4.4 09/01/2018 1715   K 4.2 08/26/2018 1402   K 3.1 (L) 09/17/2017 1429   K 4.8 07/30/2017 1504   K 4.3 06/11/2017 1451   CL 113 (H) 09/02/2018 0630   CL 115 (H) 09/01/2018 1715   CL 116 (H) 08/26/2018 1402   CO2 16 (L) 09/02/2018 0630   CO2 15 (L) 09/01/2018 1715   CO2 14 (L) 08/26/2018 1402   CO2 23 09/17/2017 1429   CO2 20 (L) 07/30/2017 1504   CO2 20 (L) 06/11/2017 1451   GLUCOSE 106 (H) 09/02/2018 0630   GLUCOSE 129 (H) 09/01/2018 1715   GLUCOSE 93 08/26/2018 1402   GLUCOSE 96 09/17/2017 1429   GLUCOSE  79 07/30/2017 1504   GLUCOSE 74 06/11/2017 1451   BUN 10 09/02/2018 0630   BUN 10 09/01/2018 1715   BUN 18 08/26/2018 1402   BUN 15.6 09/17/2017 1429   BUN 24.4 07/30/2017 1504   BUN 18.7 06/11/2017 1451   CREATININE 1.46 (H) 09/02/2018 0630   CREATININE 1.60 (H) 09/01/2018 1715   CREATININE 2.10 (H) 08/26/2018 1402   CREATININE 1.74 (H) 08/19/2018 1332   CREATININE 2.27 (H) 08/09/2018 0819   CREATININE 1.0 09/17/2017 1429   CREATININE 1.2 (H) 07/30/2017 1504   CREATININE 1.2 (H) 06/11/2017 1451   CALCIUM 9.2 09/02/2018 0630   CALCIUM 9.7 09/01/2018 1715   CALCIUM 9.3 08/26/2018 1402   CALCIUM 8.6 09/17/2017 1429   CALCIUM 9.1 07/30/2017 1504   CALCIUM 9.6 06/11/2017 1451   GFRNONAA 35 (L) 09/02/2018 0630   GFRNONAA 31 (L) 09/01/2018 1715   GFRNONAA 23 (L) 08/26/2018 1402   GFRNONAA 28 (L) 08/19/2018 1332   GFRNONAA 21 (L) 08/09/2018 0819   GFRAA 41 (L) 09/02/2018 0630   GFRAA 36 (L) 09/01/2018 1715   GFRAA 26 (L) 08/26/2018 1402   GFRAA 33 (L) 08/19/2018 1332   GFRAA 24 (L) 08/09/2018 0819   CBC    Component Value Date/Time   WBC 14.9 (  H) 09/02/2018 0630   RBC 3.30 (L) 09/02/2018 0630   HGB 9.2 (L) 09/02/2018 0630   HGB 8.8 (L) 08/26/2018 1402   HGB 11.3 (L) 09/17/2017 1428   HCT 30.5 (L) 09/02/2018 0630   HCT 35.8 09/17/2017 1428   PLT 344 09/02/2018 0630   PLT 262 08/26/2018 1402   PLT 231 09/17/2017 1428   MCV 92.4 09/02/2018 0630   MCV 93.2 09/17/2017 1428   MCH 27.9 09/02/2018 0630   MCHC 30.2 09/02/2018 0630   RDW 19.1 (H) 09/02/2018 0630   RDW 16.5 (H) 09/17/2017 1428   LYMPHSABS 0.8 08/26/2018 1402   LYMPHSABS 0.7 (L) 09/17/2017 1428   MONOABS 1.4 (H) 08/26/2018 1402   MONOABS 0.5 09/17/2017 1428   EOSABS 0.1 08/26/2018 1402   EOSABS 0.1 09/17/2017 1428   BASOSABS 0.1 08/26/2018 1402   BASOSABS 0.0 09/17/2017 1428   HEPATIC Function Panel Recent Labs    08/09/18 0819 08/19/18 1332 08/26/18 1402  PROT 8.5* 7.9 8.1   HEMOGLOBIN A1C No  components found for: HGA1C,  MPG CARDIAC ENZYMES Lab Results  Component Value Date   CKTOTAL 52 08/21/2017   CKMB 1.3 08/21/2017   TROPONINI 0.54 (HH) 09/02/2018   TROPONINI 0.46 (HH) 09/02/2018   TROPONINI 0.30 (HH) 09/01/2018   BNP No results for input(s): PROBNP in the last 8760 hours. TSH Recent Labs    12/13/17 2243  TSH 1.922   CHOLESTEROL Recent Labs    12/14/17 0457 09/02/18 0152  CHOL 167 143    Scheduled Meds: . [MAR Hold] acyclovir  400 mg Oral BID  . [MAR Hold] adenosine (ADENOCARD) IV  6 mg Intravenous Once  . [MAR Hold] aspirin  324 mg Oral NOW   Or  . [MAR Hold] aspirin  300 mg Rectal NOW  . [MAR Hold] aspirin EC  81 mg Oral Daily  . [MAR Hold] atorvastatin  80 mg Oral q1800  . [MAR Hold] calcium carbonate  1 tablet Oral Q breakfast  . [MAR Hold] ferrous sulfate  325 mg Oral Q breakfast  . [MAR Hold] hydrALAZINE  12.5 mg Oral BID  . [MAR Hold] pantoprazole  40 mg Oral Daily  . [MAR Hold] pyridostigmine  30 mg Oral BID  . [MAR Hold] ticagrelor  60 mg Oral BID  . [MAR Hold] tiZANidine  4 mg Oral QHS  . [MAR Hold] traMADol  50 mg Oral Q12H   Continuous Infusions: . sodium chloride 50 mL/hr at 09/01/18 1959  . heparin 750 Units/hr (09/02/18 0727)   PRN Meds:.[MAR Hold] acetaminophen, fentaNYL, Heparin (Porcine) in NaCl, midazolam, [MAR Hold]  morphine injection, [MAR Hold] ondansetron (ZOFRAN) IV  Assessment/Plan: Acute coronary syndrome Multivessel CAD Stent in LAD and LCx Multiple myeloma Anemia of chronic GI bleed H/O DVT CKD, III Hypertension  Cardiac cath today.   LOS: 1 day    [name]  MD  09/02/2018, 8:29 AM

## 2018-09-03 LAB — BASIC METABOLIC PANEL
ANION GAP: 8 (ref 5–15)
BUN: 10 mg/dL (ref 8–23)
CO2: 17 mmol/L — AB (ref 22–32)
Calcium: 8.4 mg/dL — ABNORMAL LOW (ref 8.9–10.3)
Chloride: 116 mmol/L — ABNORMAL HIGH (ref 98–111)
Creatinine, Ser: 1.98 mg/dL — ABNORMAL HIGH (ref 0.44–1.00)
GFR calc Af Amer: 28 mL/min — ABNORMAL LOW (ref >60)
GFR calc non Af Amer: 24 mL/min — ABNORMAL LOW (ref >60)
GLUCOSE: 82 mg/dL (ref 70–99)
POTASSIUM: 4.1 mmol/L (ref 3.5–5.1)
Sodium: 141 mmol/L (ref 135–145)

## 2018-09-03 LAB — CBC
HCT: 27.9 % — ABNORMAL LOW (ref 36.0–46.0)
Hemoglobin: 8.3 g/dL — ABNORMAL LOW (ref 12.0–15.0)
MCH: 27.3 pg (ref 26.0–34.0)
MCHC: 29.7 g/dL — ABNORMAL LOW (ref 30.0–36.0)
MCV: 91.8 fL (ref 80.0–100.0)
NRBC: 0.2 % (ref 0.0–0.2)
Platelets: 313 10*3/uL (ref 150–400)
RBC: 3.04 MIL/uL — AB (ref 3.87–5.11)
RDW: 19.3 % — AB (ref 11.5–15.5)
WBC: 9.2 10*3/uL (ref 4.0–10.5)

## 2018-09-03 LAB — ECHOCARDIOGRAM COMPLETE
Height: 65.5 in
Weight: 2645.52 oz

## 2018-09-03 MED ORDER — COLCHICINE 0.6 MG PO TABS: 0.6000 mg | ORAL_TABLET | Freq: Every day | ORAL | 0 refills | Status: DC

## 2018-09-03 MED ORDER — COLCHICINE 0.6 MG PO TABS: 0.6000 mg | ORAL_TABLET | Freq: Every day | ORAL | Status: DC

## 2018-09-03 MED ORDER — ISOSORBIDE MONONITRATE ER 30 MG PO TB24
15.0000 mg | ORAL_TABLET | Freq: Every day | ORAL | Status: DC
  Administered 2018-09-03: 15 mg via ORAL
  Filled 2018-09-03: qty 1

## 2018-09-03 MED ORDER — HYDRALAZINE HCL 25 MG PO TABS: 12.5000 mg | ORAL_TABLET | Freq: Two times a day (BID) | ORAL | Status: DC

## 2018-09-03 MED ORDER — ISOSORBIDE MONONITRATE ER 30 MG PO TB24: 30.0000 mg | ORAL_TABLET | Freq: Every day | ORAL | 1 refills | Status: DC

## 2018-09-03 NOTE — Progress Notes (Signed)
Patient received discharge information and acknowledged understanding of it. Patient IV was removed.  

## 2018-09-03 NOTE — Discharge Summary (Signed)
Physician Discharge Summary  Patient ID: Abigail [name] MRN: [id number] DOB/AGE: Feb 21, 1946 72 y.o.  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 Her Troponin I levels were minimally elevated. Her lipid panel was excellent with LDL cholesterol of 65 mg. and HDL cholesterol of 65 mg. She underwent cardiac catheterization that showed patent stents and moderate multivessel CAD. Here echocardiogram showed my LV systolic dysfunction, moderate MR and TR and trivial pericardial effusion. She will use Tramadol and colchicine as needed for early pericarditis.  She can not tolerate ACE inhibitor and B-blocker. She will see me in 1 week.   Consults: cardiology  Significant Diagnostic Studies: labs: Near normal CBC except Hgb of 8.8 g. BMET is near normal except Creatinine of 2.10. Lipid panel showed 66 mg. Triglycerides, 65 HDL and 65 LDL cholesterol level. Troponin I levels wer mildly elevated at 0.46.  EKG-SR, lateral ischemia.  Chest X-ray: cardiac enlargement with vascular congestion.  Cardiac cath: Patent LAD and LCx stents. Moderate multivessel CAD.  Echocardiogram : Mild LV systolic and diastolic dysfunction with moderate MR and TR.  Treatments: cardiac meds: Hydralazine, Potassium, Atorvastatin,  Aspirin, Imdur and Ticagrelor.  Discharge Exam: Blood pressure 117/67, pulse 68, temperature 99.2 F (37.3 C), temperature source Oral, resp. rate 11, height 5' 5.5" (1.664 m), weight 75.8 kg, SpO2 95 %. General appearance: alert, cooperative and appears stated age. Head: Normocephalic, atraumatic. Eyes: Brown eyes, pale pink conjunctiva, corneas clear. PERRL, EOM's intact.  Neck: No adenopathy, no carotid bruit, no JVD, supple, symmetrical, trachea midline and thyroid not enlarged. Resp: Clear to auscultation bilaterally. Cardio: Regular rate and rhythm, S1, S2 normal, II/VI systolic murmur, no click, rub or gallop. GI: Soft, non-tender; bowel sounds normal; no organomegaly. Extremities: No edema, cyanosis or clubbing. Skin: Warm and dry.  Neurologic: Alert and oriented X 3, normal strength and tone. Normal coordination and slow gait.  Disposition: Discharge disposition: 01-Home or Self Care        Allergies as of 09/03/2018      Reactions   Sulfa Antibiotics Rash      Medication List    TAKE these medications   acyclovir 400 MG tablet Commonly known as:  ZOVIRAX TAKE 1 TABLET (400 MG TOTAL) BY MOUTH 2 (TWO) TIMES DAILY.   aspirin EC 81 MG tablet Take 1 tablet (81 mg total) by mouth daily.   atorvastatin 80 MG tablet Commonly known as:  LIPITOR Take 1 tablet (80 mg total) by mouth daily at 6 PM.   BRILINTA 60 MG Tabs tablet Generic drug:  ticagrelor Take 60 mg 2 (two) times daily by mouth.   calcium carbonate 1250 (500 Ca) MG tablet Commonly known as:  OS-CAL - dosed in mg of elemental calcium Take 1 tablet by mouth daily with breakfast.   colchicine 0.6 MG tablet Take 1 tablet (0.6 mg total) by mouth  daily.   ferrous sulfate 325 (65 FE) MG tablet Take 1 tablet (325 mg total) by mouth daily with breakfast.   hydrALAZINE 25 MG tablet Commonly known as:  APRESOLINE Take 0.5 tablets (12.5 mg total) by mouth 2 (two) times daily.   isosorbide mononitrate 30 MG  24 hr tablet Commonly known as:  IMDUR Take 1 tablet (30 mg total) by mouth daily. Start taking on:  09/04/2018   nitroGLYCERIN 0.4 MG SL tablet Commonly known as:  NITROSTAT Place 0.4 mg under the tongue every 5 (five) minutes as needed for chest pain.   pantoprazole 40 MG tablet Commonly known as:  PROTONIX Take 1 tablet (40 mg total) by mouth daily.   potassium chloride SA 20 MEQ tablet Commonly known as:  K-DUR,KLOR-CON Take 1 tablet twice daily for 3 days, then 1 tablet daily   pyridostigmine 60 MG tablet Commonly known as:  MESTINON Take 0.5 tablets (30 mg total) by mouth 2 (two) times daily.   tiZANidine 4 MG tablet Commonly known as:  ZANAFLEX Take 4 mg by mouth 2 (two) times daily as needed.   traMADol 50 MG tablet Commonly known as:  ULTRAM Take 1 tablet by mouth every 6 (six) hours as needed.        Signed: Birdie Riddle 09/03/2018, 3:12 PM

## 2018-09-03 NOTE — Care Management Important Message (Signed)
Important Message  Patient Details  Name: Abigail [name] MRN: [id number] Date of Birth: [birth year]   Medicare Important Message Given:  Yes    Anner Baity Montine Circle 09/03/2018, 4:32 PM

## 2018-09-03 NOTE — Evaluation (Signed)
Physical Therapy Evaluation & Discharge Patient Details Name: Abigail [name] MRN: [id number] DOB: [birth year] Today's Date: 09/03/2018   History of Present Illness  Pt is a 72 y.o. female admitted 09/01/18 with c/o chest pain. Had spontaneous cardioversion to sinus rhythm with EKG showing isolated PVC and non-specific T changes. S/p cardiac cath 11/14 showed moderate multivessel CAD. PMH includes multiple myeloma s/p chemo, CAD, DVT.    Clinical Impression  Patient evaluated by Physical Therapy with no further acute PT needs identified. PTA, pt indep and lives alone; multiple family members/friends nearby. Today, pt mod indep with RW. Encouraged use of RW at home secondary to pt reports 'furniture surfing' and is at increased risk for falls. All education has been completed and the patient has no further questions. PT is signing off. Thank you for this referral.    Follow Up Recommendations No PT follow up;Supervision - Intermittent    Equipment Recommendations  None recommended by PT    Recommendations for Other Services       Precautions / Restrictions Precautions Precautions: Fall Restrictions Weight Bearing Restrictions: No      Mobility  Bed Mobility Overal bed mobility: Independent             General bed mobility comments: Received sitting EOB  Transfers Overall transfer level: Independent Equipment used: None                Ambulation/Gait Ambulation/Gait assistance: Modified independent (Device/Increase time) Gait Distance (Feet): 150 Feet Assistive device: Rolling walker (2 wheeled) Gait Pattern/deviations: Step-through pattern;Decreased stride length;Trunk flexed Gait velocity: Decreased Gait velocity interpretation: 1.31 - 2.62 ft/sec, indicative of limited community ambulator General Gait Details: Pt looking for UE support on furniture in room; mod indep using RW in hallway. Encouraged DME use at home since pt reports she furniture surfs  typically  Stairs            Wheelchair Mobility    Modified Rankin (Stroke Patients Only)       Balance Overall balance assessment: Mild deficits observed, not formally tested                                           Pertinent Vitals/Pain Pain Assessment: No/denies pain    Home Living Family/patient expects to be discharged to:: Private residence Living Arrangements: Alone Available Help at Discharge: Family;Friend(s);Available PRN/intermittently Type of Home: Apartment Home Access: Stairs to enter Entrance Stairs-Rails: Left Entrance Stairs-Number of Steps: 3 Home Layout: One level Home Equipment: Walker - 2 wheels;Cane - single point;Grab bars - tub/shower      Prior Function Level of Independence: Independent with assistive device(s)         Comments: Mod indep with intermittent use of RW. Does not drive; friend drives to all appointments/errands.      Hand Dominance        Extremity/Trunk Assessment   Upper Extremity Assessment Upper Extremity Assessment: Overall WFL for tasks assessed    Lower Extremity Assessment Lower Extremity Assessment: Overall WFL for tasks assessed       Communication   Communication: No difficulties  Cognition Arousal/Alertness: Awake/alert Behavior During Therapy: Flat affect Overall Cognitive Status: Within Functional Limits for tasks assessed  General Comments      Exercises     Assessment/Plan    PT Assessment Patent does not need any further PT services  PT Problem List         PT Treatment Interventions      PT Goals (Current goals can be found in the Care Plan section)  Acute Rehab PT Goals PT Goal Formulation: All assessment and education complete, DC therapy    Frequency     Barriers to discharge        Co-evaluation               AM-PAC PT "6 Clicks" Daily Activity  Outcome Measure Difficulty turning  over in bed (including adjusting bedclothes, sheets and blankets)?: None Difficulty moving from lying on back to sitting on the side of the bed? : None Difficulty sitting down on and standing up from a chair with arms (e.g., wheelchair, bedside commode, etc,.)?: None Help needed moving to and from a bed to chair (including a wheelchair)?: None Help needed walking in hospital room?: None Help needed climbing 3-5 steps with a railing? : A Little 6 Click Score: 23    End of Session Equipment Utilized During Treatment: Gait belt Activity Tolerance: Patient tolerated treatment well Patient left: with family/visitor present(in room with sister awaiting d/c) Nurse Communication: Mobility status PT Visit Diagnosis: Other abnormalities of gait and mobility (R26.89)    Time: 4854-6270 PT Time Calculation (min) (ACUTE ONLY): 10 min   Charges:   PT Evaluation $PT Eval Moderate Complexity: Bud, PT, DPT Acute Rehabilitation Services  Pager [phone] Office [phone]  Derry Lory 09/03/2018, 3:46 PM

## 2018-09-06 ENCOUNTER — Telehealth: Payer: Self-pay | Admitting: *Deleted

## 2018-09-06 NOTE — Telephone Encounter (Signed)
Just discharged from hospital. Asking what follow up she needs?

## 2018-09-08 NOTE — Telephone Encounter (Signed)
Notified patient that she will be scheduled to come on 09/14/18 to see [name], NP and have her velcade treatment. Scheduling will be calling her soon.

## 2018-09-09 ENCOUNTER — Telehealth: Payer: Self-pay | Admitting: Oncology

## 2018-09-09 ENCOUNTER — Telehealth: Payer: Self-pay | Admitting: Nurse Practitioner

## 2018-09-09 NOTE — Telephone Encounter (Signed)
Faxed medical records to Annie Paras with [facility]. Release ID# [id number]

## 2018-09-09 NOTE — Telephone Encounter (Signed)
Scheduled appt per 11/19 sch mesasge - pt is aware of appt date and time

## 2018-09-14 ENCOUNTER — Inpatient Hospital Stay: Payer: Medicare HMO

## 2018-09-14 ENCOUNTER — Encounter: Payer: Self-pay | Admitting: Nurse Practitioner

## 2018-09-14 ENCOUNTER — Telehealth: Payer: Self-pay | Admitting: Nurse Practitioner

## 2018-09-14 ENCOUNTER — Inpatient Hospital Stay (HOSPITAL_BASED_OUTPATIENT_CLINIC_OR_DEPARTMENT_OTHER): Payer: Medicare HMO | Admitting: Nurse Practitioner

## 2018-09-14 ENCOUNTER — Other Ambulatory Visit: Payer: Self-pay

## 2018-09-14 VITALS — BP 154/86 | HR 54 | Temp 99.0°F | Resp 19 | Ht 65.5 in | Wt 162.1 lb

## 2018-09-14 DIAGNOSIS — I2510 Atherosclerotic heart disease of native coronary artery without angina pectoris: Secondary | ICD-10-CM | POA: Diagnosis not present

## 2018-09-14 DIAGNOSIS — D649 Anemia, unspecified: Secondary | ICD-10-CM

## 2018-09-14 DIAGNOSIS — Z86718 Personal history of other venous thrombosis and embolism: Secondary | ICD-10-CM | POA: Diagnosis not present

## 2018-09-14 DIAGNOSIS — Z9221 Personal history of antineoplastic chemotherapy: Secondary | ICD-10-CM

## 2018-09-14 DIAGNOSIS — Z86010 Personal history of colonic polyps: Secondary | ICD-10-CM | POA: Diagnosis not present

## 2018-09-14 DIAGNOSIS — Z7901 Long term (current) use of anticoagulants: Secondary | ICD-10-CM | POA: Diagnosis not present

## 2018-09-14 DIAGNOSIS — C9000 Multiple myeloma not having achieved remission: Secondary | ICD-10-CM | POA: Diagnosis not present

## 2018-09-14 DIAGNOSIS — M545 Low back pain: Secondary | ICD-10-CM | POA: Diagnosis not present

## 2018-09-14 DIAGNOSIS — R7989 Other specified abnormal findings of blood chemistry: Secondary | ICD-10-CM | POA: Diagnosis not present

## 2018-09-14 DIAGNOSIS — C9002 Multiple myeloma in relapse: Secondary | ICD-10-CM

## 2018-09-14 LAB — CMP (CANCER CENTER ONLY)
ALT: 7 U/L (ref 0–44)
AST: 17 U/L (ref 15–41)
Albumin: 3.5 g/dL (ref 3.5–5.0)
Alkaline Phosphatase: 84 U/L (ref 38–126)
Anion gap: 13 (ref 5–15)
BUN: 8 mg/dL (ref 8–23)
CHLORIDE: 112 mmol/L — AB (ref 98–111)
CO2: 17 mmol/L — AB (ref 22–32)
CREATININE: 1.62 mg/dL — AB (ref 0.44–1.00)
Calcium: 8.6 mg/dL — ABNORMAL LOW (ref 8.9–10.3)
GFR, EST AFRICAN AMERICAN: 37 mL/min — AB (ref >60)
GFR, EST NON AFRICAN AMERICAN: 32 mL/min — AB (ref >60)
Glucose, Bld: 93 mg/dL (ref 70–99)
POTASSIUM: 3.1 mmol/L — AB (ref 3.5–5.1)
Sodium: 142 mmol/L (ref 135–145)
Total Bilirubin: 0.7 mg/dL (ref 0.3–1.2)
Total Protein: 8 g/dL (ref 6.5–8.1)

## 2018-09-14 LAB — CBC WITH DIFFERENTIAL (CANCER CENTER ONLY)
ABS IMMATURE GRANULOCYTES: 0.04 10*3/uL (ref 0.00–0.07)
BASOS ABS: 0 10*3/uL (ref 0.0–0.1)
BASOS PCT: 1 %
Eosinophils Absolute: 0.1 10*3/uL (ref 0.0–0.5)
Eosinophils Relative: 2 %
HCT: 31 % — ABNORMAL LOW (ref 36.0–46.0)
Hemoglobin: 9.2 g/dL — ABNORMAL LOW (ref 12.0–15.0)
Immature Granulocytes: 1 %
Lymphocytes Relative: 22 %
Lymphs Abs: 1.3 10*3/uL (ref 0.7–4.0)
MCH: 28.1 pg (ref 26.0–34.0)
MCHC: 29.7 g/dL — ABNORMAL LOW (ref 30.0–36.0)
MCV: 94.8 fL (ref 80.0–100.0)
MONO ABS: 1 10*3/uL (ref 0.1–1.0)
Monocytes Relative: 17 %
NEUTROS ABS: 3.3 10*3/uL (ref 1.7–7.7)
NRBC: 0 % (ref 0.0–0.2)
Neutrophils Relative %: 57 %
PLATELETS: 197 10*3/uL (ref 150–400)
RBC: 3.27 MIL/uL — AB (ref 3.87–5.11)
RDW: 19.8 % — AB (ref 11.5–15.5)
WBC: 5.7 10*3/uL (ref 4.0–10.5)

## 2018-09-14 MED ORDER — DEXAMETHASONE 4 MG PO TABS: ORAL_TABLET | ORAL | 1 refills | Status: DC

## 2018-09-14 MED ORDER — POTASSIUM CHLORIDE CRYS ER 20 MEQ PO TBCR: EXTENDED_RELEASE_TABLET | ORAL | 1 refills | Status: DC

## 2018-09-14 MED ORDER — CYCLOPHOSPHAMIDE 50 MG PO CAPS: 50.0000 mg | ORAL_CAPSULE | Freq: Every day | ORAL | 0 refills | Status: DC

## 2018-09-14 NOTE — Addendum Note (Signed)
Addended by: Lenox Ponds E on: 09/14/2018 04:57 PM   Modules accepted: Orders

## 2018-09-14 NOTE — Progress Notes (Addendum)
[facility] OFFICE PROGRESS NOTE   Diagnosis: Multiple myeloma  INTERVAL HISTORY:   Abigail [name] returns for follow-up.  She has completed 2 cycles of Cytoxan/Velcade/dexamethasone.  Treatment subsequently placed on hold due to elevated creatinine of unclear etiology.  She was hospitalized 09/01/2018 with chest pain.  She underwent cardiac catheterization which showed patent stents and moderate multivessel CAD.  Echocardiogram showed LV systolic dysfunction, moderate MR and TR and trivial pericardial effusion.  She was discharged home on 09/03/2018.  She reports a poor appetite.  Fluid intake is good.  She has occasional chest pain.  No shortness of breath.  She has occasional pain at the left lower abdomen.  Bowels moving regularly.  No nausea or vomiting.  Objective:  Vital signs in last 24 hours:  Blood pressure (!) 154/86, pulse (!) 54, temperature 99 F (37.2 C), temperature source Oral, resp. rate 19, height 5' 5.5" (1.664 m), weight 162 lb 1.6 oz (73.5 kg), SpO2 100 %.    HEENT: Mild white coating over tongue.  No buccal thrush. Resp: Lungs clear bilaterally. Cardio: Regular rate and rhythm. GI: Abdomen soft and nontender.  No hepatosplenomegaly. Vascular: No leg edema.   Lab Results:  Lab Results  Component Value Date   WBC 5.7 09/14/2018   HGB 9.2 (L) 09/14/2018   HCT 31.0 (L) 09/14/2018   MCV 94.8 09/14/2018   PLT 197 09/14/2018   NEUTROABS 3.3 09/14/2018    Imaging:  No results found.  Medications: I have reviewed the patient's current medications.  Assessment/Plan: 1. Multiple myeloma, IgG lambda, bone marrow biopsy 06/15/2016 confirmed multiple myeloma; cytogenetics by FISHshow +11, +12, 13q-  Elevated serum free lambda light chains  Lambda light chain proteinuria  Lytic bone lesions on a bone survey 06/13/2016  Initiation of weekly Velcade/Decadron 06/19/2016  Serum light chains improved 07/31/2016  Initiation of Revlimid  11/03/20172 weeks on/2 weeks off  Serum M spike and IgG significantly improved 09/25/2016  Velcade held on 10/30/2016 due to urinary retention  Treatment resumed with Revlimid (2 weeks on/2 weeks off) and weekly Decadron on 02/12/2017, treatment placed on hold May 2018 secondary to admissions with cardiac disease  05/27/2018-increased serum M spike, IgG, serum lambda light chains  Bone survey 05/31/2018-widespread myeloma without detectable progression since 06/13/2016.  Cycle 1 Cytoxan/Velcade/Decadron 06/24/2018  Cycle 2 Cytoxan/Velcade/Decadron 07/22/2018  08/09/2018 serum light chains, M spike, IgGimproved 2. Severe anemia secondary to #1-markedly improved  3. Diffuse lytic bone lesions secondary to multiple myeloma, status post Zometa 09/18/2016(plan to continue every 3 months);most recent Zometa infusion 10/08/2017  4. History of coronary artery disease/myocardial infarction  5. History of colon polyps  6. Bilateral leg edema 09/04/2016. Negative bilateral venous Doppler 09/05/2016.  7. Mild periorbital edema 09/18/2016, question related to early stye formation, question Velcade related chalazia.Improved.  8. CT scan 10/27/2016 with a new right kidney massevaluated by urology; MRI abdomen 11/18/2016 multiple bilateral renal cysts. Most of these are simple cysts. The 2 lesions in question on the CT scan are both hemorrhagic or proteinaceous cysts. No worrisome contrast enhancement.  9. Urinary retention 10/28/2016 status post evaluation in the emergency department. Urinalysis negative for signs of infection 10/30/2016. Velcade held. Resolved.  10. Abdominal pain, nausea, vomiting.Etiology unclear. Upper endoscopy 11/18/2016 with findings of reflux esophagitis, benign appearing esophageal stenosis, small hiatal hernia and erythematous duodenopathy. CT abdomen/pelvis 11/19/2016 with no acute abnormality.  11. Multivessel CAD status post LAD and diagonal vessel stent  placement  12. Left lower extremity DVT May 2018.Maintainedon  Coumadin.Coumadin discontinued October 2018 secondary to a supratherapeutic PT/INR and GI bleeding  13.Admission with GI bleeding October and November 2018-felt secondary to diverticular bleeding, initially while on supratherapeutic Coumadin, colonoscopy 09/04/2017-no source of blood loss identified, superficial tear at the posterior anal mucosa without stigmata of bleeding  14. Acute creatinine elevation 3.26 on 08/05/2018 of cycle 2 day 15; confirmed on repeat 3.13 on 08/06/18; improved to 2/27 on 10/21; 24 hour urine is pending. Currently holding lasix, no recent CT contrast. Recently increased NSAID use for back pain, encouraged to decrease use and increase po hydration    Disposition: Abigail [name] appears stable.  She has completed 2 cycles of Cytoxan/Velcade/dexamethasone.  Myeloma labs from 08/19/2018 show improvement.  Treatment has been on hold due to elevated creatinine of unclear etiology.  The creatinine is better today.  Dr. Benay Spice recommends resuming treatment.  She will return in 1 week to begin cycle 3 Cytoxan/Velcade/dexamethasone.  We will see her in follow-up in 2 weeks.  She will contact the office in the interim with any problems.  Potassium refill sent to her pharmacy.  Plan reviewed with Dr. Benay Spice.    [name] ANP/GNP-BC   09/14/2018  3:21 PM

## 2018-09-14 NOTE — Telephone Encounter (Signed)
Gave pt avs and calendar  °

## 2018-09-14 NOTE — Progress Notes (Signed)
Per [name]  Pt. to re start cytoxan on 09/23/18 TC to [facility] [phone] Pt still had a prescription on file. Medication will be sent to Pt. On 09/21/18

## 2018-09-15 ENCOUNTER — Other Ambulatory Visit: Payer: Self-pay

## 2018-09-15 ENCOUNTER — Observation Stay (HOSPITAL_COMMUNITY)
Admission: EM | Admit: 2018-09-15 | Discharge: 2018-09-17 | Disposition: A | Discharge status: Home / Self Care | Payer: Medicare HMO | Attending: Cardiovascular Disease | Admitting: Cardiovascular Disease

## 2018-09-15 ENCOUNTER — Emergency Department (HOSPITAL_COMMUNITY): Payer: Medicare HMO

## 2018-09-15 ENCOUNTER — Encounter (HOSPITAL_COMMUNITY): Payer: Self-pay | Admitting: Emergency Medicine

## 2018-09-15 DIAGNOSIS — Z9889 Other specified postprocedural states: Secondary | ICD-10-CM | POA: Insufficient documentation

## 2018-09-15 DIAGNOSIS — I700 Atherosclerosis of aorta: Secondary | ICD-10-CM | POA: Diagnosis not present

## 2018-09-15 DIAGNOSIS — I495 Sick sinus syndrome: Secondary | ICD-10-CM | POA: Diagnosis not present

## 2018-09-15 DIAGNOSIS — E785 Hyperlipidemia, unspecified: Secondary | ICD-10-CM | POA: Diagnosis not present

## 2018-09-15 DIAGNOSIS — N183 Chronic kidney disease, stage 3 (moderate): Secondary | ICD-10-CM | POA: Insufficient documentation

## 2018-09-15 DIAGNOSIS — R0789 Other chest pain: Principal | ICD-10-CM | POA: Insufficient documentation

## 2018-09-15 DIAGNOSIS — Z7982 Long term (current) use of aspirin: Secondary | ICD-10-CM | POA: Insufficient documentation

## 2018-09-15 DIAGNOSIS — Z955 Presence of coronary angioplasty implant and graft: Secondary | ICD-10-CM | POA: Insufficient documentation

## 2018-09-15 DIAGNOSIS — R778 Other specified abnormalities of plasma proteins: Secondary | ICD-10-CM

## 2018-09-15 DIAGNOSIS — Z8719 Personal history of other diseases of the digestive system: Secondary | ICD-10-CM | POA: Diagnosis not present

## 2018-09-15 DIAGNOSIS — Z86718 Personal history of other venous thrombosis and embolism: Secondary | ICD-10-CM | POA: Insufficient documentation

## 2018-09-15 DIAGNOSIS — I4891 Unspecified atrial fibrillation: Secondary | ICD-10-CM

## 2018-09-15 DIAGNOSIS — I480 Paroxysmal atrial fibrillation: Secondary | ICD-10-CM | POA: Insufficient documentation

## 2018-09-15 DIAGNOSIS — J9811 Atelectasis: Secondary | ICD-10-CM | POA: Insufficient documentation

## 2018-09-15 DIAGNOSIS — F17200 Nicotine dependence, unspecified, uncomplicated: Secondary | ICD-10-CM | POA: Insufficient documentation

## 2018-09-15 DIAGNOSIS — Z9841 Cataract extraction status, right eye: Secondary | ICD-10-CM | POA: Diagnosis not present

## 2018-09-15 DIAGNOSIS — Z90710 Acquired absence of both cervix and uterus: Secondary | ICD-10-CM | POA: Insufficient documentation

## 2018-09-15 DIAGNOSIS — I130 Hypertensive heart and chronic kidney disease with heart failure and stage 1 through stage 4 chronic kidney disease, or unspecified chronic kidney disease: Secondary | ICD-10-CM | POA: Diagnosis not present

## 2018-09-15 DIAGNOSIS — I2510 Atherosclerotic heart disease of native coronary artery without angina pectoris: Secondary | ICD-10-CM | POA: Diagnosis not present

## 2018-09-15 DIAGNOSIS — C9002 Multiple myeloma in relapse: Secondary | ICD-10-CM | POA: Diagnosis not present

## 2018-09-15 DIAGNOSIS — I252 Old myocardial infarction: Secondary | ICD-10-CM | POA: Insufficient documentation

## 2018-09-15 DIAGNOSIS — R9431 Abnormal electrocardiogram [ECG] [EKG]: Secondary | ICD-10-CM | POA: Diagnosis not present

## 2018-09-15 DIAGNOSIS — Z79899 Other long term (current) drug therapy: Secondary | ICD-10-CM | POA: Insufficient documentation

## 2018-09-15 DIAGNOSIS — D638 Anemia in other chronic diseases classified elsewhere: Secondary | ICD-10-CM | POA: Insufficient documentation

## 2018-09-15 DIAGNOSIS — Z882 Allergy status to sulfonamides status: Secondary | ICD-10-CM | POA: Insufficient documentation

## 2018-09-15 DIAGNOSIS — R7989 Other specified abnormal findings of blood chemistry: Secondary | ICD-10-CM

## 2018-09-15 DIAGNOSIS — I5021 Acute systolic (congestive) heart failure: Secondary | ICD-10-CM | POA: Diagnosis not present

## 2018-09-15 DIAGNOSIS — R072 Precordial pain: Secondary | ICD-10-CM | POA: Diagnosis present

## 2018-09-15 DIAGNOSIS — K219 Gastro-esophageal reflux disease without esophagitis: Secondary | ICD-10-CM | POA: Insufficient documentation

## 2018-09-15 LAB — CBC
HEMATOCRIT: 38.3 % (ref 36.0–46.0)
HEMOGLOBIN: 10.7 g/dL — AB (ref 12.0–15.0)
MCH: 27.2 pg (ref 26.0–34.0)
MCHC: 27.9 g/dL — AB (ref 30.0–36.0)
MCV: 97.2 fL (ref 80.0–100.0)
Platelets: 239 10*3/uL (ref 150–400)
RBC: 3.94 MIL/uL (ref 3.87–5.11)
RDW: 19.8 % — ABNORMAL HIGH (ref 11.5–15.5)
WBC: 6.2 10*3/uL (ref 4.0–10.5)
nRBC: 0 % (ref 0.0–0.2)

## 2018-09-15 LAB — BASIC METABOLIC PANEL
Anion gap: 12 (ref 5–15)
BUN: 6 mg/dL — AB (ref 8–23)
CHLORIDE: 111 mmol/L (ref 98–111)
CO2: 16 mmol/L — AB (ref 22–32)
CREATININE: 1.61 mg/dL — AB (ref 0.44–1.00)
Calcium: 9.3 mg/dL (ref 8.9–10.3)
GFR calc Af Amer: 37 mL/min — ABNORMAL LOW (ref >60)
GFR calc non Af Amer: 32 mL/min — ABNORMAL LOW (ref >60)
GLUCOSE: 98 mg/dL (ref 70–99)
Potassium: 3 mmol/L — ABNORMAL LOW (ref 3.5–5.1)
SODIUM: 139 mmol/L (ref 135–145)

## 2018-09-15 LAB — I-STAT TROPONIN, ED: Troponin i, poc: 0.11 ng/mL (ref 0.00–0.08)

## 2018-09-15 MED ORDER — ONDANSETRON HCL 4 MG/2ML IJ SOLN
4.0000 mg | Freq: Once | INTRAMUSCULAR | Status: AC
  Administered 2018-09-15: 4 mg via INTRAVENOUS
  Filled 2018-09-15: qty 2

## 2018-09-15 MED ORDER — MORPHINE SULFATE (PF) 4 MG/ML IV SOLN
4.0000 mg | Freq: Once | INTRAVENOUS | Status: AC
  Administered 2018-09-15: 4 mg via INTRAVENOUS
  Filled 2018-09-15: qty 1

## 2018-09-15 MED ORDER — HYDRALAZINE HCL 25 MG PO TABS
12.5000 mg | ORAL_TABLET | Freq: Two times a day (BID) | ORAL | Status: DC
  Administered 2018-09-16 – 2018-09-17 (×4): 12.5 mg via ORAL
  Filled 2018-09-15 (×4): qty 1

## 2018-09-15 MED ORDER — PYRIDOSTIGMINE BROMIDE 60 MG PO TABS
30.0000 mg | ORAL_TABLET | Freq: Two times a day (BID) | ORAL | Status: DC
  Filled 2018-09-15: qty 0.5

## 2018-09-15 MED ORDER — TRAMADOL HCL 50 MG PO TABS
50.0000 mg | ORAL_TABLET | Freq: Four times a day (QID) | ORAL | Status: DC | PRN
  Administered 2018-09-16 – 2018-09-17 (×4): 50 mg via ORAL
  Filled 2018-09-15 (×4): qty 1

## 2018-09-15 MED ORDER — DILTIAZEM HCL-DEXTROSE 100-5 MG/100ML-% IV SOLN (PREMIX)
5.0000 mg/h | INTRAVENOUS | Status: DC
  Filled 2018-09-15: qty 100

## 2018-09-15 MED ORDER — PANTOPRAZOLE SODIUM 40 MG PO TBEC
40.0000 mg | DELAYED_RELEASE_TABLET | Freq: Every day | ORAL | Status: DC
  Administered 2018-09-16: 40 mg via ORAL
  Filled 2018-09-15: qty 1

## 2018-09-15 MED ORDER — FERROUS SULFATE 325 (65 FE) MG PO TABS
325.0000 mg | ORAL_TABLET | Freq: Every day | ORAL | Status: DC
  Administered 2018-09-16 – 2018-09-17 (×2): 325 mg via ORAL
  Filled 2018-09-15 (×3): qty 1

## 2018-09-15 MED ORDER — DILTIAZEM LOAD VIA INFUSION
10.0000 mg | Freq: Once | INTRAVENOUS | Status: DC
  Filled 2018-09-15: qty 10

## 2018-09-15 MED ORDER — NITROGLYCERIN 0.4 MG SL SUBL
0.4000 mg | SUBLINGUAL_TABLET | SUBLINGUAL | Status: DC | PRN
  Administered 2018-09-16 (×3): 0.4 mg via SUBLINGUAL
  Filled 2018-09-15: qty 1

## 2018-09-15 MED ORDER — HEPARIN SODIUM (PORCINE) 5000 UNIT/ML IJ SOLN: 5000.0000 [IU] | Freq: Three times a day (TID) | INTRAMUSCULAR | Status: DC

## 2018-09-15 MED ORDER — POTASSIUM CHLORIDE CRYS ER 20 MEQ PO TBCR
20.0000 meq | EXTENDED_RELEASE_TABLET | Freq: Two times a day (BID) | ORAL | Status: DC
  Administered 2018-09-16 – 2018-09-17 (×4): 20 meq via ORAL
  Filled 2018-09-15 (×4): qty 1

## 2018-09-15 MED ORDER — ATORVASTATIN CALCIUM 80 MG PO TABS
80.0000 mg | ORAL_TABLET | Freq: Every day | ORAL | Status: DC
  Administered 2018-09-16 – 2018-09-17 (×2): 80 mg via ORAL
  Filled 2018-09-15 (×3): qty 1

## 2018-09-15 MED ORDER — ACYCLOVIR 400 MG PO TABS
400.0000 mg | ORAL_TABLET | Freq: Two times a day (BID) | ORAL | Status: DC
  Administered 2018-09-16 – 2018-09-17 (×4): 400 mg via ORAL
  Filled 2018-09-15 (×5): qty 1

## 2018-09-15 MED ORDER — CALCIUM CARBONATE 1250 (500 CA) MG PO TABS
1.0000 | ORAL_TABLET | Freq: Every day | ORAL | Status: DC
  Administered 2018-09-16 – 2018-09-17 (×2): 500 mg via ORAL
  Filled 2018-09-15 (×3): qty 1

## 2018-09-15 MED ORDER — APIXABAN 2.5 MG PO TABS
2.5000 mg | ORAL_TABLET | Freq: Two times a day (BID) | ORAL | Status: DC
  Administered 2018-09-16 – 2018-09-17 (×4): 2.5 mg via ORAL
  Filled 2018-09-15 (×4): qty 1

## 2018-09-15 MED ORDER — TIZANIDINE HCL 4 MG PO TABS
4.0000 mg | ORAL_TABLET | Freq: Every day | ORAL | Status: DC
  Administered 2018-09-16 (×2): 4 mg via ORAL
  Filled 2018-09-15 (×2): qty 1

## 2018-09-15 MED ORDER — ASPIRIN EC 81 MG PO TBEC
81.0000 mg | DELAYED_RELEASE_TABLET | Freq: Every day | ORAL | Status: DC
  Administered 2018-09-16: 81 mg via ORAL
  Filled 2018-09-15: qty 1

## 2018-09-15 MED ORDER — ISOSORBIDE MONONITRATE ER 30 MG PO TB24
30.0000 mg | ORAL_TABLET | Freq: Every day | ORAL | Status: DC
  Administered 2018-09-16 – 2018-09-17 (×2): 30 mg via ORAL
  Filled 2018-09-15 (×2): qty 1

## 2018-09-15 MED ORDER — POTASSIUM CHLORIDE CRYS ER 20 MEQ PO TBCR
40.0000 meq | EXTENDED_RELEASE_TABLET | Freq: Once | ORAL | Status: AC
  Administered 2018-09-15: 40 meq via ORAL
  Filled 2018-09-15: qty 2

## 2018-09-15 MED ORDER — TICAGRELOR 60 MG PO TABS
60.0000 mg | ORAL_TABLET | Freq: Two times a day (BID) | ORAL | Status: DC
  Administered 2018-09-16 (×2): 60 mg via ORAL
  Filled 2018-09-15 (×3): qty 1

## 2018-09-15 NOTE — ED Provider Notes (Signed)
Medical screening examination/treatment/procedure(s) were conducted as a shared visit with non-physician practitioner(s) and myself.  I personally evaluated the patient during the encounter.  Clinical Impression:   Final diagnoses:  Atrial fibrillation with rapid ventricular response (HCC)  Elevated troponin    Patient is a pleasant 72 year old female, history of cancer, not in remission, currently getting treatment, history of recent heart catheterization 1 week ago because of a elevated troponin, iliac stents were found to be patent, there was multivessel coronary disease which was nonobstructive.  She was found to be in atrial fibrillation today, she is brought to the hospital because of this tachycardia, she is stating that she is feeling short of breath and has chest pain that radiates into her left arm.  She is on Brilinta but is not on any rate control agents based on her discharge summary from the last week or so.  On exam the patient is tachycardic, she does have pulses, she does not have any significant edema, I did see the patient approximately 1 week ago at her last visit and she does not appear to be decompensated since that time.  Dx - afib with RVR - pulse iin the 160's, variable. Needs rate control, admit - will d/c w/ Dr. Doylene Canard, critically ill - diltiazem with drop ordered  Initial EKG was tachycardic in what appeared to be rapid afib - now has improved to the 60's.   EKG Interpretation  Date/Time:  Wednesday September 15 2018 14:07:33 EST Ventricular Rate:  67 PR Interval:    QRS Duration: 111 QT Interval:  415 QTC Calculation: 439 R Axis:   27 Text Interpretation:  Sinus or ectopic atrial rhythm Probable left ventricular hypertrophy Anterior Q waves, possibly due to LVH Nonspecific T abnormalities, lateral leads Since last tracing atrial arrhythmia has resolved Confirmed by Noemi Chapel [phone]) on 09/15/2018 2:12:10 PM      The patient spontaneously converted to  sinus rhythm, she still feels nauseated and short of breath, I discussed her care with her primary cardiologist Dr. Doylene Canard who will come to admit her.  .Critical Care Performed by: Noemi Chapel, MD Authorized by: Noemi Chapel, MD   Critical care provider statement:    Critical care time (minutes):  35   Critical care time was exclusive of:  Separately billable procedures and treating other patients and teaching time   Critical care was necessary to treat or prevent imminent or life-threatening deterioration of the following conditions: arrhythmia.   Critical care was time spent personally by me on the following activities:  Blood draw for specimens, development of treatment plan with patient or surrogate, discussions with consultants, evaluation of patient's response to treatment, examination of patient, obtaining history from patient or surrogate, ordering and performing treatments and interventions, ordering and review of laboratory studies, ordering and review of radiographic studies, pulse oximetry, re-evaluation of patient's condition and review of old charts      Noemi Chapel, MD 09/16/18 1146

## 2018-09-15 NOTE — H&P (Signed)
Referring Physician:  [name] is an 72 y.o. female.                       Chief Complaint: Chest pain  HPI: 72 year old female presented with chest pain, shortness of breath and atrial fibrillation with RVR. She spontaneously slowed down heart rate to 50-60's. She has low Hgb of 10.7, Creatinine of 1.61 and potassium of 3.0. Her coronary stents were patent on recent cardiac catheterization. Chest pain is retrosternal, increased with palpation. She denies fever or cough. She is off chemotherapy for her multiple myeloma.   Past Medical History:  Diagnosis Date  . ACS (acute coronary syndrome) ([place]) 06/2017  . Anemia    "off and on all my life" (12/22/2016)  . Anxiety   . Bilateral renal artery stenosis ([place]) 1999   s/p stenting  . Chronic kidney disease (CKD), stage III (moderate) ([place])    Abigail [name] 12/22/2016  . Coronary artery disease 1999  . Depression   . Diverticulosis   . Duodenitis   . DVT (deep venous thrombosis) (HCC)    on Coumadin  . Dyspnea   . Gastric erosions   . Gastric ulcer   . GERD (gastroesophageal reflux disease)   . Heart murmur   . History of blood transfusion ~ 03/2016   "low HgB; practically nonexistent"  . Hyperlipidemia   . Hypertension   . MI (myocardial infarction) ([place]) 1999  . Multiple myeloma ([place]) dx'd ~ 04/2016  . PAT (paroxysmal atrial tachycardia) ([place]) 12/22/2016  . Retinal hemorrhage, right eye   . Schatzki's ring   . Tachy-brady syndrome ([place])    Abigail [name] 12/22/2016  . Tubular adenoma of colon       Past Surgical History:  Procedure Laterality Date  . APPENDECTOMY    . BACK SURGERY    . CATARACT EXTRACTION W/ INTRAOCULAR LENS IMPLANT Right 2014  . COLONOSCOPY WITH PROPOFOL N/A 09/04/2017   Procedure: COLONOSCOPY WITH PROPOFOL;  Surgeon: Doran Stabler, MD;  Location: [facility];  Service: Gastroenterology;  Laterality: N/A;  . CORONARY ANGIOGRAPHY N/A 01/23/2017   Procedure: Coronary Angiography;  Surgeon: [name], MD;  Location:  [facility];  Service: Cardiovascular;  Laterality: N/A;  . CORONARY ANGIOPLASTY WITH STENT PLACEMENT  1999  . CORONARY STENT INTERVENTION N/A 12/29/2016   Procedure: Coronary Stent Intervention;  Surgeon: Charolette Forward, MD;  Location: [facility];  Service: Cardiovascular;  Laterality: N/A;  . CORONARY STENT INTERVENTION N/A 02/24/2017   Procedure: Coronary Stent Intervention;  Surgeon: Charolette Forward, MD;  Location: [facility];  Service: Cardiovascular;  Laterality: N/A;  . ESOPHAGOGASTRODUODENOSCOPY N/A 11/03/2015   Procedure: ESOPHAGOGASTRODUODENOSCOPY (EGD);  Surgeon: Carol Ada, MD;  Location[facility] END[facility];  Service: Endoscopy;  Laterality: N/A;  . ESOPHAGOGASTRODUODENOSCOPY N/A 09/03/2017   Procedure: ESOPHAGOGASTRODUODENOSCOPY (EGD);  Surgeon: Doran Stabler, MD;  Location: [facility];  Service: Gastroenterology;  Laterality: N/A;  . ESOPHAGOGASTRODUODENOSCOPY (EGD) WITH PROPOFOL N/A 11/18/2016   Procedure: ESOPHAGOGASTRODUODENOSCOPY (EGD) WITH PROPOFOL;  Surgeon: Ladene Artist, MD;  Location: [facility] ENDOSCOPY;  Service: Endoscopy;  Laterality: N/A;  . LEFT HEART CATH AND CORONARY ANGIOGRAPHY N/A 12/24/2016   Procedure: Left Heart Cath and Coronary Angiography;  Surgeon: [name], MD;  Location: [facility];  Service: Cardiovascular;  Laterality: N/A;  . LEFT HEART CATH AND CORONARY ANGIOGRAPHY N/A 02/24/2017   Procedure: Left Heart Cath and Coronary Angiography;  Surgeon: Charolette Forward, MD;  Location: [facility];  Service: Cardiovascular;  Laterality: N/A;  . LEFT HEART CATH AND CORONARY ANGIOGRAPHY N/A 03/05/2017   Procedure: Left Heart Cath and Coronary Angiography;  Surgeon: [name], MD;  Location: [facility];  Service: Cardiovascular;  Laterality: N/A;  . LEFT HEART CATH AND CORONARY ANGIOGRAPHY N/A 09/02/2018   Procedure: LEFT HEART CATH AND CORONARY ANGIOGRAPHY;  Surgeon: [name], MD;  Location: [facility];  Service:  Cardiovascular;  Laterality: N/A;  . LUMBAR [place]    . RENAL ARTERY STENT  1999   bil RAS so ? bil vs unilateral stents  . RETINAL LASER PROCEDURE Right 2012   "bleeding"  . TONSILLECTOMY    . TOTAL ABDOMINAL HYSTERECTOMY      Family History  Problem Relation Age of Onset  . Breast cancer Mother   . Breast cancer Sister   . Breast cancer Maternal Aunt   . Colon cancer Neg Hx    Social History:  reports that she has been smoking cigarettes. She has a 25.00 pack-year smoking history. She has never used smokeless tobacco. She reports that she does not drink alcohol or use drugs.  Allergies:  Allergies  Allergen Reactions  . Sulfa Antibiotics Rash     (Not in a hospital admission)  Results for orders placed or performed during the hospital encounter of 09/15/18 (from the past 48 hour(s))  Basic metabolic panel     Status: Abnormal   Collection Time: 09/15/18 12:10 PM  Result Value Ref Range   Sodium 139 135 - 145 mmol/L   Potassium 3.0 (L) 3.5 - 5.1 mmol/L   Chloride 111 98 - 111 mmol/L   CO2 16 (L) 22 - 32 mmol/L   Glucose, Bld 98 70 - 99 mg/dL   BUN 6 (L) 8 - 23 mg/dL   Creatinine, Ser 1.61 (H) 0.44 - 1.00 mg/dL   Calcium 9.3 8.9 - 10.3 mg/dL   GFR calc non Af Amer 32 (L) >60 mL/min   GFR calc Af Amer 37 (L) >60 mL/min   Anion gap 12 5 - 15    Comment: Performed at [facility], [address], [place], [place] [address]  CBC     Status: Abnormal   Collection Time: 09/15/18 12:10 PM  Result Value Ref Range   WBC 6.2 4.0 - 10.5 K/uL   RBC 3.94 3.87 - 5.11 MIL/uL   Hemoglobin 10.7 (L) 12.0 - 15.0 g/dL   HCT 38.3 36.0 - 46.0 %   MCV 97.2 80.0 - 100.0 fL   MCH 27.2 26.0 - 34.0 pg   MCHC 27.9 (L) 30.0 - 36.0 g/dL   RDW 19.8 (H) 11.5 - 15.5 %   Platelets 239 150 - 400 K/uL   nRBC 0.0 0.0 - 0.2 %    Comment: Performed at [facility] [address], [place], [place] [address]  I-stat troponin, ED     Status: Abnormal   Collection Time: 09/15/18  12:32 PM  Result Value Ref Range   Troponin i, poc 0.11 (HH) 0.00 - 0.08 ng/mL   Comment NOTIFIED PHYSICIAN    Comment 3            Comment: Due to the release kinetics of cTnI, a negative result within the first hours of the onset of symptoms does not rule out myocardial infarction with certainty. If myocardial infarction is still suspected, repeat the test at appropriate intervals.    Dg Chest  2 View  Result Date: 09/15/2018 CLINICAL DATA:  Shortness of breath and chest pain EXAM: CHEST - 2 VIEW COMPARISON:  September 01, 2018 FINDINGS: There is right base atelectasis. Lungs elsewhere are clear. Heart is mildly enlarged with pulmonary vascularity normal. No adenopathy. There is aortic atherosclerosis. No evident bone lesions. IMPRESSION: Right base atelectasis. No edema or consolidation. Heart mildly enlarged. There is aortic atherosclerosis. Aortic Atherosclerosis (ICD10-I70.0). Electronically Signed   By: Lowella Grip III M.D.   On: 09/15/2018 13:19    Review Of Systems Constitutional: No fever, chills, weight loss or gain. Eyes: No vision change, wears glasses. No discharge or pain. Ears: No hearing loss, No tinnitus. Respiratory: No asthma, COPD, pneumonias. Positive shortness of breath. No hemoptysis. Cardiovascular: Positive chest pain, palpitation, leg edema. Gastrointestinal: Positive nausea, vomiting, no diarrhea, constipation. Positive GI bleed. No hepatitis. Genitourinary: No dysuria, hematuria, kidney stone. No incontinance. Neurological: Positive headache, stroke, seizures.  Psychiatry: No psych facility admission for anxiety, depression, suicide. No detox. Skin: No rash. Musculoskeletal: Positive joint pain, fibromyalgia, neck pain, back pain. Lymphadenopathy: No lymphadenopathy. Hematology: Positive anemia, no easy bruising.  Blood pressure (!) 166/84, pulse 66, temperature 98.2 F (36.8 C), temperature source Oral, resp. rate 15, height '5\' 5"'  (1.651 m), weight  73.5 kg, SpO2 97 %. Body mass index is 26.96 kg/m. General appearance: alert, cooperative, appears stated age and no distress Head: Normocephalic, atraumatic. Eyes: Brown eyes, pale pink conjunctiva, corneas clear. PERRL, EOM's intact. Neck: No adenopathy, no carotid bruit, no JVD, supple, symmetrical, trachea midline and thyroid not enlarged. Resp: Clear to auscultation bilaterally. Chest wall tender on palpation. Cardio: Irregular rate and rhythm, S1, S2 normal, II/VI systolic murmur, no click, rub or gallop GI: Soft, non-tender; bowel sounds normal; no organomegaly. Extremities: No edema, cyanosis or clubbing. Skin: Warm and dry.  Neurologic: Alert and oriented X 3, normal strength. Normal coordination and slow gait.  Assessment/Plan Chest pain, musculoskeletal Atrial fibrillation, CHA2DS2VASc score of 3 Anemia of chronic disease and GI blood loss CAD Stents in LAD, LCx Multiple myeloma H/O DVT CKD, III Hypertension Moderate MR and TR  Place in observation Monitor. Consider anticoagulation.   Birdie Riddle, MD  09/15/2018, 10:54 PM

## 2018-09-15 NOTE — ED Notes (Signed)
[place] REGARDING ADMIT ORDER

## 2018-09-15 NOTE — ED Notes (Signed)
IV team at bedside 

## 2018-09-15 NOTE — ED Triage Notes (Signed)
Pt states she started feeling SOB today. Pt having intermittent CP- radiates into left arm. Pt feels nauseated and vomited prior to coming. Feeling weak/like she may pass out.

## 2018-09-15 NOTE — ED Provider Notes (Signed)
[facility] EMERGENCY DEPARTMENT Provider Note   CSN: [id number] Arrival date & time: 09/15/18  1139   History   Chief Complaint Chief Complaint  Patient presents with  . Shortness of Breath  . Chest Pain    HPI Abigail [name] is a 72 y.o. female with a PMH of paroxysmal atrial fibrillation, ACS, anemia, and CKD presenting with constant shortness of breath and intermittent central chest pain onset 8am today. Patient states chest pain radiates to left arm intermittently. Patient states she had a cath by Dr. Doylene Canard last week and it showed fluid around her heart. Patient denies taking any medications for her symptoms. Patient reports an episode of vomiting and nausea, but denies abdominal pain. Patient reports a low grade fever yesterday, but denies cough or congestion. Patients associated palpitations, lightheadedness, presyncope, and diffuse weakness.   HPI  Past Medical History:  Diagnosis Date  . ACS (acute coronary syndrome) ([place]) 06/2017  . Anemia    "off and on all my life" (12/22/2016)  . Anxiety   . Bilateral renal artery stenosis ([place]) 1999   s/p stenting  . Chronic kidney disease (CKD), stage III (moderate) ([place])    Archie Endo 12/22/2016  . Coronary artery disease 1999  . Depression   . Diverticulosis   . Duodenitis   . DVT (deep venous thrombosis) ([facility])    on Coumadin  . Dyspnea   . Gastric erosions   . Gastric ulcer   . GERD (gastroesophageal reflux disease)   . Heart murmur   . History of blood transfusion ~ 03/2016   "low HgB; practically nonexistent"  . Hyperlipidemia   . Hypertension   . MI (myocardial infarction) ([place]) 1999  . Multiple myeloma ([place]) dx'd ~ 04/2016  . PAT (paroxysmal atrial tachycardia) ([place]) 12/22/2016  . Retinal hemorrhage, right eye   . Schatzki's ring   . Tachy-brady syndrome ([place])    Archie Endo 12/22/2016  . Tubular adenoma of colon     Patient Active Problem List   Diagnosis Date Noted  . Multiple myeloma in relapse ([place])  06/15/2018  . Goals of care, counseling/discussion 06/15/2018  . Hematochezia   . Acute blood loss anemia   . Diverticulosis of colon with hemorrhage   . GIB (gastrointestinal bleeding) 09/03/2017  . Symptomatic anemia 09/03/2017  . Acute systolic heart failure ([place]) 09/03/2017  . Chronic kidney disease (CKD), stage III (moderate) ([place]) 09/03/2017  . Hyperlipidemia 09/03/2017  . Hypertension 09/03/2017  . Multiple myeloma in remission ([place]) 09/03/2017  . GI bleed 08/17/2017  . Sepsis ([place]) 08/17/2017  . Fever 08/16/2017  . STEMI (ST elevation myocardial infarction) ([place]) 02/24/2017  . Acute anterolateral wall MI ([place]) 02/24/2017  . Dehydration 01/21/2017  . Acute coronary syndrome ([place]) 12/24/2016  . Dizziness 12/22/2016    Class: Acute  . Orthostatic hypotension 12/08/2016  . Syncope and collapse 12/06/2016  . Tachycardia-bradycardia ([place])   . Essential hypertension   . Nausea and vomiting in adult patient   . Erosive esophagitis   . Protein-calorie malnutrition, severe 11/19/2016  . LLQ pain   . LUQ abdominal pain   . Intractable nausea and vomiting 11/17/2016  . Abdominal pain 11/05/2016  . Multiple myeloma ([place]) 06/18/2016  . Malnutrition of moderate degree 06/14/2016  . 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 Exertional dyspnea 11/01/2015  . Coronary atherosclerosis of native coronary artery 11/01/2015  . Shortness of breath 11/01/2015  . Chest pain 12/31/2011  . Coronary artery disease 10/20/1997  Past Surgical History:  Procedure Laterality Date  . APPENDECTOMY    . BACK SURGERY    . CATARACT EXTRACTION W/ INTRAOCULAR LENS IMPLANT Right 2014  . COLONOSCOPY WITH PROPOFOL N/A 09/04/2017   Procedure: COLONOSCOPY WITH PROPOFOL;  Surgeon: Doran Stabler, MD;  Location: [facility];  Service: Gastroenterology;  Laterality: N/A;  . CORONARY ANGIOGRAPHY N/A 01/23/2017   Procedure: Coronary Angiography;  Surgeon: [name], MD;   Location: [facility];  Service: Cardiovascular;  Laterality: N/A;  . CORONARY ANGIOPLASTY WITH STENT PLACEMENT  1999  . CORONARY STENT INTERVENTION N/A 12/29/2016   Procedure: Coronary Stent Intervention;  Surgeon: Charolette Forward, MD;  Location: [facility];  Service: Cardiovascular;  Laterality: N/A;  . CORONARY STENT INTERVENTION N/A 02/24/2017   Procedure: Coronary Stent Intervention;  Surgeon: Charolette Forward, MD;  Location: [facility];  Service: Cardiovascular;  Laterality: N/A;  . ESOPHAGOGASTRODUODENOSCOPY N/A 11/03/2015   Procedure: ESOPHAGOGASTRODUODENOSCOPY (EGD);  Surgeon: Carol Ada, MD;  Location: [facility] ENDOSCOPY;  Service: Endoscopy;  Laterality: N/A;  . ESOPHAGOGASTRODUODENOSCOPY N/A 09/03/2017   Procedure: ESOPHAGOGASTRODUODENOSCOPY (EGD);  Surgeon: Doran Stabler, MD;  Location: [facility];  Service: Gastroenterology;  Laterality: N/A;  . ESOPHAGOGASTRODUODENOSCOPY (EGD) WITH PROPOFOL N/A 11/18/2016   Procedure: ESOPHAGOGASTRODUODENOSCOPY (EGD) WITH PROPOFOL;  Surgeon: Ladene Artist, MD;  Location: [facility]OS[facility];  Service: Endoscopy;  Laterality: N/A;  . LEFT HEART CATH AND CORONARY ANGIOGRAPHY N/A 12/24/2016   Procedure: Left Heart Cath and Coronary Angiography;  Surgeon: [name], MD;  Location: [facility];  Service: Cardiovascular;  Laterality: N/A;  . LEFT HEART CATH AND CORONARY ANGIOGRAPHY N/A 02/24/2017   Procedure: Left Heart Cath and Coronary Angiography;  Surgeon: Charolette Forward, MD;  Location: [facility];  Service: Cardiovascular;  Laterality: N/A;  . LEFT HEART CATH AND CORONARY ANGIOGRAPHY N/A 03/05/2017   Procedure: Left Heart Cath and Coronary Angiography;  Surgeon: [name], MD;  Location: [facility];  Service: Cardiovascular;  Laterality: N/A;  . LEFT HEART CATH AND CORONARY ANGIOGRAPHY N/A 09/02/2018   Procedure: LEFT HEART CATH AND CORONARY ANGIOGRAPHY;  Surgeon: [name], MD;  Location: [facility];   Service: Cardiovascular;  Laterality: N/A;  . LUMBAR [place]    . RENAL ARTERY STENT  1999   bil RAS so ? bil vs unilateral stents  . RETINAL LASER PROCEDURE Right 2012   "bleeding"  . TONSILLECTOMY    . TOTAL ABDOMINAL HYSTERECTOMY       OB History   None      Home Medications    Prior to Admission medications   Medication Sig Start Date End Date Taking? Authorizing Provider  acyclovir (ZOVIRAX) 400 MG tablet TAKE 1 TABLET (400 MG TOTAL) BY MOUTH 2 (TWO) TIMES DAILY. 08/29/16  Yes Ladell Pier, MD  aspirin EC 81 MG tablet Take 1 tablet (81 mg total) by mouth daily. 08/21/17  Yes Bonnielee Haff, MD  atorvastatin (LIPITOR) 80 MG tablet Take 1 tablet (80 mg total) by mouth daily at 6 PM. 03/01/17  Yes [name], MD  calcium carbonate (OS-CAL - DOSED IN MG OF ELEMENTAL CALCIUM) 1250 (500 Ca) MG tablet Take 1 tablet by mouth daily with breakfast.   Yes [provider]  cyclophosphamide (CYTOXAN) 50 MG capsule Take 1 capsule (50 mg total) by mouth daily. Take 10 capsules(561m) by mouth once weekly for 3 weeks on, 1 week off, repeat every 4 weeks. Take early in  the day. Maintain hydration. Patient taking differently: Take 50-500 mg by mouth See admin instructions. Take 10 capsules(54m) by mouth once weekly for 3 weeks on,  1 week off, repeat every 4 weeks. Take early in the day. Maintain hydration. 09/23/18  Yes SLadell Pier MD  dexamethasone (DECADRON) 4 MG tablet Take 5 tabs (20 mg) on 12/5, 12/12, 12/19 Patient taking differently: Take 20 mg by mouth See admin instructions. Take 5 tabs (20 mg) on Chemo days  12/5, 12/12, 12/19 09/14/18  Yes TOwens Shark NP  ferrous sulfate 325 (65 FE) MG tablet Take 1 tablet (325 mg total) by mouth daily with breakfast. 03/02/17  Yes KDixie Dials MD  hydrALAZINE (APRESOLINE) 25 MG tablet Take 0.5 tablets (12.5 mg total) by mouth 2 (two) times daily. 09/03/18  Yes KDixie Dials MD  isosorbide mononitrate (IMDUR) 30 MG 24 hr  tablet Take 1 tablet (30 mg total) by mouth daily. 09/04/18  Yes KDixie Dials MD  nitroGLYCERIN (NITROSTAT) 0.4 MG SL tablet Place 0.4 mg under the tongue every 5 (five) minutes as needed for chest pain. 03/20/17  Yes [provider]  pantoprazole (PROTONIX) 40 MG tablet Take 1 tablet (40 mg total) by mouth daily. 03/12/17  Yes KDixie Dials MD  potassium chloride SA (K-DUR,KLOR-CON) 20 MEQ tablet Take 1 tablet twice daily for 3 days, then 1 tablet daily Patient taking differently: Take 20 mEq by mouth daily.  09/14/18  Yes TOwens Shark NP  pyridostigmine (MESTINON) 60 MG tablet Take 0.5 tablets (30 mg total) by mouth 2 (two) times daily. 01/25/17  Yes KDixie Dials MD  ticagrelor (BRILINTA) 60 MG TABS tablet Take 60 mg 2 (two) times daily by mouth.   Yes [provider]  tiZANidine (ZANAFLEX) 4 MG tablet Take 4 mg by mouth 2 (two) times daily as needed. 04/06/18  Yes [provider]  traMADol (ULTRAM) 50 MG tablet Take 1 tablet by mouth every 6 (six) hours as needed. 04/06/18  Yes [provider]  ISOSORBIDE DINITRATE PO Take by mouth.  12/30/11  [provider]    Family History Family History  Problem Relation Age of Onset  . Breast cancer Mother   . Breast cancer Sister   . Breast cancer Maternal Aunt   . Colon cancer Neg Hx     Social History Social History   Tobacco Use  . Smoking status: Current Every Day Smoker    Packs/day: 0.50    Years: 50.00    Pack years: 25.00    Types: Cigarettes  . Smokeless tobacco: Never Used  Substance Use Topics  . Alcohol use: No    Alcohol/week: 0.0 standard drinks  . Drug use: No     Allergies   Sulfa antibiotics   Review of Systems Review of Systems  Constitutional: Positive for fever. Negative for activity change, appetite change, chills, diaphoresis, fatigue and unexpected weight change.  HENT: Negative for congestion and rhinorrhea.   Respiratory: Negative for cough, chest tightness,  shortness of breath and wheezing.   Cardiovascular: Positive for chest pain and palpitations. Negative for leg swelling.  Gastrointestinal: Positive for nausea and vomiting. Negative for abdominal pain.  Endocrine: Negative for cold intolerance and heat intolerance.  Genitourinary: Negative for dysuria.  Musculoskeletal: Negative for back pain.  Skin: Negative for rash.  Allergic/Immunologic: Negative for immunocompromised state.  Neurological: Positive for weakness and light-headedness. Negative for syncope.       Pt reports presyncope.  Psychiatric/Behavioral: Negative for agitation and  behavioral problems. The patient is not nervous/anxious.      Physical Exam Updated Vital Signs BP (!) 176/77   Pulse 68   Temp 98.2 F (36.8 C) (Oral)   Resp 15   Ht '5\' 5"'  (1.651 m)   Wt 73.5 kg   SpO2 97%   BMI 26.96 kg/m   Physical Exam  Constitutional: She is oriented to person, place, and time. She appears well-developed and well-nourished. She appears ill. No distress.  Patient appears in moderate distress and tearful throughout exam.   HENT:  Head: Normocephalic and atraumatic.  Neck: Normal range of motion. Neck supple. No JVD present.  Cardiovascular: Intact distal pulses and normal pulses. Exam reveals no gallop and no friction rub.  No murmur heard. Pulses:      Radial pulses are 2+ on the right side, and 2+ on the left side.       Dorsalis pedis pulses are 2+ on the right side, and 2+ on the left side.  Pulmonary/Chest: Breath sounds normal. Tachypnea noted. She is in respiratory distress. She has no wheezes. She has no rales. She exhibits no tenderness.  Abdominal: Soft. She exhibits no distension. There is tenderness (Mild tenderness to palpation of LLQ over area where heart cath was inserted. No signs of erythema or discharge noted.). There is no guarding.  Musculoskeletal: Normal range of motion.  Neurological: She is alert and oriented to person, place, and time.  Skin:  Skin is warm. Capillary refill takes less than 2 seconds. No rash noted. She is not diaphoretic. No pallor.  Psychiatric: She has a normal mood and affect.  Nursing note and vitals reviewed.    ED Treatments / Results  Labs (all labs ordered are listed, but only abnormal results are displayed) Labs Reviewed  BASIC METABOLIC PANEL - Abnormal; Notable for the following components:      Result Value   Potassium 3.0 (*)    CO2 16 (*)    BUN 6 (*)    Creatinine, Ser 1.61 (*)    GFR calc non Af Amer 32 (*)    GFR calc Af Amer 37 (*)    All other components within normal limits  CBC - Abnormal; Notable for the following components:   Hemoglobin 10.7 (*)    MCHC 27.9 (*)    RDW 19.8 (*)    All other components within normal limits  I-STAT TROPONIN, ED - Abnormal; Notable for the following components:   Troponin i, poc 0.11 (*)    All other components within normal limits    EKG EKG Interpretation  Date/Time:  Wednesday September 15 2018 14:07:33 EST Ventricular Rate:  67 PR Interval:    QRS Duration: 111 QT Interval:  415 QTC Calculation: 439 R Axis:   27 Text Interpretation:  Sinus or ectopic atrial rhythm Probable left ventricular hypertrophy Anterior Q waves, possibly due to LVH Nonspecific T abnormalities, lateral leads Since last tracing atrial arrhythmia has resolved Confirmed by Noemi Chapel [phone]) on 09/15/2018 2:12:10 PM   Radiology Dg Chest 2 View  Result Date: 09/15/2018 CLINICAL DATA:  Shortness of breath and chest pain EXAM: CHEST - 2 VIEW COMPARISON:  September 01, 2018 FINDINGS: There is right base atelectasis. Lungs elsewhere are clear. Heart is mildly enlarged with pulmonary vascularity normal. No adenopathy. There is aortic atherosclerosis. No evident bone lesions. IMPRESSION: Right base atelectasis. No edema or consolidation. Heart mildly enlarged. There is aortic atherosclerosis. Aortic Atherosclerosis (ICD10-I70.0). Electronically Signed   By: Gwyndolyn Saxon  Jasmine December III M.D.   On: 09/15/2018 13:19    Procedures Procedures (including critical care time)  Medications Ordered in ED Medications  potassium chloride SA (K-DUR,KLOR-CON) CR tablet 40 mEq (40 mEq Oral Given 09/15/18 1527)  ondansetron (ZOFRAN) injection 4 mg (4 mg Intravenous Given 09/15/18 1527)     Initial Impression / Assessment and Plan / ED Course  I have reviewed the triage vital signs and the nursing notes.  Pertinent labs & imaging results that were available during my care of the patient were reviewed by me and considered in my medical decision making (see chart for details).  Clinical Course as of Sep 16 1555  Wed Sep 15, 2018  1243 Elevated troponin at 0.11. Patient had an NSTEMI on 11/13.   Troponin i, poc(!!): 0.11 [AH]  1248 Upon review of records, patient had an NSTEMI on 11/13 and a cath done on 11/14. Patient was started on ASA 80m and Ticagretol 922mBID. Patient had an Echo on 11/14 and it revealed a 45-50% ejection fraction.    [AH]  1409 Right base atelectasis and no edema or consolidation.     DG Chest 2 View [AH]  1414 Hypokalemia noted. Will provide supplemental potassium.   Potassium(!): 3.0 [AH]  1418 Heart rate improved from 114 to 65. Will hold Cardizem at this time.   Pulse Rate: 65 [AH]  1419 Patient denies shortness of breath or chest pain currently. Patient reports nausea.    [AH]  1438 Creatinine is elevated at 1.61. Last creatinine was 1.98 on last visit   Creatinine(!): 1.61 [AH]    Clinical Course User Index [AH] HeArville LimePA-C   BUN  Date Value Ref Range Status  09/15/2018 6 (L) 8 - 23 mg/dL Final  09/14/2018 8 8 - 23 mg/dL Final  09/03/2018 10 8 - 23 mg/dL Final  09/02/2018 10 8 - 23 mg/dL Final  09/17/2017 15.6 7.0 - 26.0 mg/dL Final  07/30/2017 24.4 7.0 - 26.0 mg/dL Final  06/11/2017 18.7 7.0 - 26.0 mg/dL Final  04/09/2017 23.4 7.0 - 26.0 mg/dL Final   Creatinine  Date Value Ref Range Status  09/14/2018 1.62  (H) 0.44 - 1.00 mg/dL Final  08/26/2018 2.10 (H) 0.44 - 1.00 mg/dL Final  08/19/2018 1.74 (H) 0.44 - 1.00 mg/dL Final  08/09/2018 2.27 (H) 0.44 - 1.00 mg/dL Final  09/17/2017 1.0 0.6 - 1.1 mg/dL Final  07/30/2017 1.2 (H) 0.6 - 1.1 mg/dL Final  06/11/2017 1.2 (H) 0.6 - 1.1 mg/dL Final  04/09/2017 1.0 0.6 - 1.1 mg/dL Final   Creatinine, Ser  Date Value Ref Range Status  09/15/2018 1.61 (H) 0.44 - 1.00 mg/dL Final  09/03/2018 1.98 (H) 0.44 - 1.00 mg/dL Final  09/02/2018 1.46 (H) 0.44 - 1.00 mg/dL Final  09/01/2018 1.60 (H) 0.44 - 1.00 mg/dL Final    Assessment/Plan: Patient presents with complaint of shortness of breath. EKG reveals atrial fibrillation with RVR. Atrial fibrillation with RVR converted spontaneously. Patient did not require Cardizem. Troponin is elevated at 0.11. Last troponin was 0.54 on 09/02/18. CXR does not reveal edema or consolidation that would suggest pneumonia. Provided zofran for nausea. Provided potassium for hypokalemia. Patient's symptoms have improved in the ER. Patient will benefit from an inpatient admission for observation to due to elevated troponin, risk factors, and atrial fibrillation with RVR. Dr. MiSabra Heckpoke to Dr. KaDoylene Canardho agreed to admit patient.   CRITICAL CARE Performed by: Mirranda Monrroy P Clare Fennimore  Total critical care time: 35 minutes  Critical  care time was exclusive of separately billable procedures and treating other patients.  Critical care was necessary to treat or prevent imminent or life-threatening deterioration.  Critical care was time spent personally by me on the following activities: development of treatment plan with patient and/or surrogate as well as nursing, discussions with consultants, evaluation of patient's response to treatment, examination of patient, obtaining history from patient or surrogate, ordering and performing treatments and interventions, ordering and review of laboratory studies, ordering and review of radiographic  studies, pulse oximetry and re-evaluation of patient's condition.  Findings and plan of care discussed with supervising physician Dr. Sabra Heck who personally evaluated and examined this patient.  Final Clinical Impressions(s) / ED Diagnoses   Final diagnoses:  Atrial fibrillation with rapid ventricular response [facility])  Elevated troponin    ED Discharge Orders    None       Arville Lime, [place] 09/15/18 1556    Noemi Chapel, MD 09/16/18 1147

## 2018-09-15 NOTE — ED Notes (Signed)
This RN attempted an IV x2, IV team consulted.

## 2018-09-15 NOTE — ED Notes (Signed)
Pt persistently tachycardic, IV team at bedside, MD Sabra Heck aware.

## 2018-09-15 NOTE — ED Notes (Signed)
Patient transported to X-ray 

## 2018-09-16 DIAGNOSIS — I5021 Acute systolic (congestive) heart failure: Secondary | ICD-10-CM | POA: Diagnosis not present

## 2018-09-16 DIAGNOSIS — N183 Chronic kidney disease, stage 3 (moderate): Secondary | ICD-10-CM | POA: Diagnosis not present

## 2018-09-16 DIAGNOSIS — R0789 Other chest pain: Secondary | ICD-10-CM | POA: Diagnosis not present

## 2018-09-16 DIAGNOSIS — I130 Hypertensive heart and chronic kidney disease with heart failure and stage 1 through stage 4 chronic kidney disease, or unspecified chronic kidney disease: Secondary | ICD-10-CM | POA: Diagnosis not present

## 2018-09-16 LAB — CBC
HEMATOCRIT: 30.3 % — AB (ref 36.0–46.0)
HEMOGLOBIN: 8.8 g/dL — AB (ref 12.0–15.0)
MCH: 27.1 pg (ref 26.0–34.0)
MCHC: 29 g/dL — ABNORMAL LOW (ref 30.0–36.0)
MCV: 93.2 fL (ref 80.0–100.0)
Platelets: 238 10*3/uL (ref 150–400)
RBC: 3.25 MIL/uL — AB (ref 3.87–5.11)
RDW: 19.9 % — AB (ref 11.5–15.5)
WBC: 6.4 10*3/uL (ref 4.0–10.5)
nRBC: 0 % (ref 0.0–0.2)

## 2018-09-16 LAB — BASIC METABOLIC PANEL
Anion gap: 9 (ref 5–15)
BUN: 6 mg/dL — ABNORMAL LOW (ref 8–23)
CHLORIDE: 113 mmol/L — AB (ref 98–111)
CO2: 19 mmol/L — AB (ref 22–32)
CREATININE: 1.48 mg/dL — AB (ref 0.44–1.00)
Calcium: 9.1 mg/dL (ref 8.9–10.3)
GFR calc non Af Amer: 35 mL/min — ABNORMAL LOW (ref >60)
GFR, EST AFRICAN AMERICAN: 41 mL/min — AB (ref >60)
Glucose, Bld: 115 mg/dL — ABNORMAL HIGH (ref 70–99)
POTASSIUM: 3.8 mmol/L (ref 3.5–5.1)
Sodium: 141 mmol/L (ref 135–145)

## 2018-09-16 LAB — PROTIME-INR
INR: 1.28
Prothrombin Time: 15.9 seconds — ABNORMAL HIGH (ref 11.4–15.2)

## 2018-09-16 MED ORDER — ALUM & MAG HYDROXIDE-SIMETH 200-200-20 MG/5ML PO SUSP: 30.0000 mL | ORAL | Status: DC | PRN

## 2018-09-16 MED ORDER — ENSURE ENLIVE PO LIQD
237.0000 mL | Freq: Two times a day (BID) | ORAL | Status: DC
  Administered 2018-09-16 – 2018-09-17 (×2): 237 mL via ORAL

## 2018-09-16 MED ORDER — ONDANSETRON HCL 4 MG/2ML IJ SOLN
4.0000 mg | Freq: Four times a day (QID) | INTRAMUSCULAR | Status: DC | PRN
  Administered 2018-09-16: 4 mg via INTRAVENOUS
  Filled 2018-09-16: qty 2

## 2018-09-16 MED ORDER — DILTIAZEM HCL 30 MG PO TABS
30.0000 mg | ORAL_TABLET | Freq: Two times a day (BID) | ORAL | Status: DC
  Administered 2018-09-16 – 2018-09-17 (×3): 30 mg via ORAL
  Filled 2018-09-16 (×3): qty 1

## 2018-09-16 MED ORDER — ONDANSETRON HCL 4 MG PO TABS
4.0000 mg | ORAL_TABLET | Freq: Once | ORAL | Status: AC
  Administered 2018-09-16: 4 mg via ORAL
  Filled 2018-09-16: qty 1

## 2018-09-16 MED ORDER — PYRIDOSTIGMINE BROMIDE 60 MG PO TABS
30.0000 mg | ORAL_TABLET | Freq: Two times a day (BID) | ORAL | Status: DC
  Administered 2018-09-16 – 2018-09-17 (×4): 30 mg via ORAL
  Filled 2018-09-16 (×5): qty 0.5

## 2018-09-16 NOTE — Progress Notes (Signed)
Informed pharmacy of missing dose, mestinon, awaiting delivery

## 2018-09-16 NOTE — Progress Notes (Signed)
Pt IV flushed with normal saline, IV leaking  Removed IV  Attempted 1x  Pt states they used [place] to place in upper arm  Consult placed for IV team  Informed MD  MD stated to give oral zofran  Pt aware

## 2018-09-16 NOTE — Progress Notes (Signed)
Pt states she does not take eliquis, only brilinta

## 2018-09-16 NOTE — Progress Notes (Signed)
MD Doylene Canard aware of pt refusing eliquis, stated he will educate pt  MD at bedside

## 2018-09-16 NOTE — Progress Notes (Signed)
Pt vomiting, clear/ yellow, 132mL, no PRN for antiemetic Paged MD to inform  Awaiting call back

## 2018-09-16 NOTE — Progress Notes (Signed)
Assisted pt ordering breakfast  Pt states her pain is much better as "1/10"  Will continue to monitor

## 2018-09-16 NOTE — Progress Notes (Signed)
Pt reports indigestion. Offered pt maalox, pt denies at this time

## 2018-09-16 NOTE — Progress Notes (Signed)
Ref: Nolene Ebbs, MD   Subjective:  Feeling better. Monitor shows sinus rhythm. Did not tolerate warfarin in past.  Objective:  Vital Signs in the last 24 hours: Temp:  [98.2 F (36.8 C)-99.1 F (37.3 C)] 99.1 F (37.3 C) (11/28 0039) Pulse Rate:  [50-154] 79 (11/28 0837) Cardiac Rhythm: Normal sinus rhythm (11/28 0100) Resp:  [14-20] 20 (11/28 0039) BP: (119-197)/(60-115) 151/75 (11/28 0837) SpO2:  [95 %-100 %] 95 % (11/28 0039) Weight:  [72.1 kg-73.5 kg] 72.1 kg (11/28 0036)  Physical Exam: BP Readings from Last 1 Encounters:  09/16/18 (!) 151/75     Wt Readings from Last 1 Encounters:  09/16/18 72.1 kg    Weight change:  Body mass index is 26.04 kg/m. HEENT: [place]/AT, Eyes-Brown, PERL, EOMI, Conjunctiva-Pale, Sclera-Non-icteric Neck: No JVD, No bruit, Trachea midline. Lungs:  Clear, Bilateral. Cardiac:  Regular rhythm, normal S1 and S2, no S3. II/VI systolic murmur. Abdomen:  Soft, non-tender. BS present. Extremities:  No edema present. No cyanosis. No clubbing. CNS: AxOx3, Cranial nerves grossly intact, moves all 4 extremities.  Skin: Warm and dry.   Intake/Output from previous day: No intake/output data recorded.    Lab Results: BMET    Component Value Date/Time   NA 141 09/16/2018 0430   NA 139 09/15/2018 1210   NA 142 09/14/2018 1409   NA 144 09/17/2017 1429   NA 141 07/30/2017 1504   NA 142 06/11/2017 1451   K 3.8 09/16/2018 0430   K 3.0 (L) 09/15/2018 1210   K 3.1 (L) 09/14/2018 1409   K 3.1 (L) 09/17/2017 1429   K 4.8 07/30/2017 1504   K 4.3 06/11/2017 1451   CL 113 (H) 09/16/2018 0430   CL 111 09/15/2018 1210   CL 112 (H) 09/14/2018 1409   CO2 19 (L) 09/16/2018 0430   CO2 16 (L) 09/15/2018 1210   CO2 17 (L) 09/14/2018 1409   CO2 23 09/17/2017 1429   CO2 20 (L) 07/30/2017 1504   CO2 20 (L) 06/11/2017 1451   GLUCOSE 115 (H) 09/16/2018 0430   GLUCOSE 98 09/15/2018 1210   GLUCOSE 93 09/14/2018 1409   GLUCOSE 96 09/17/2017 1429   GLUCOSE 79  07/30/2017 1504   GLUCOSE 74 06/11/2017 1451   BUN 6 (L) 09/16/2018 0430   BUN 6 (L) 09/15/2018 1210   BUN 8 09/14/2018 1409   BUN 15.6 09/17/2017 1429   BUN 24.4 07/30/2017 1504   BUN 18.7 06/11/2017 1451   CREATININE 1.48 (H) 09/16/2018 0430   CREATININE 1.61 (H) 09/15/2018 1210   CREATININE 1.62 (H) 09/14/2018 1409   CREATININE 1.98 (H) 09/03/2018 0419   CREATININE 2.10 (H) 08/26/2018 1402   CREATININE 1.74 (H) 08/19/2018 1332   CREATININE 1.0 09/17/2017 1429   CREATININE 1.2 (H) 07/30/2017 1504   CREATININE 1.2 (H) 06/11/2017 1451   CALCIUM 9.1 09/16/2018 0430   CALCIUM 9.3 09/15/2018 1210   CALCIUM 8.6 (L) 09/14/2018 1409   CALCIUM 8.6 09/17/2017 1429   CALCIUM 9.1 07/30/2017 1504   CALCIUM 9.6 06/11/2017 1451   GFRNONAA 35 (L) 09/16/2018 0430   GFRNONAA 32 (L) 09/15/2018 1210   GFRNONAA 32 (L) 09/14/2018 1409   GFRNONAA 24 (L) 09/03/2018 0419   GFRNONAA 23 (L) 08/26/2018 1402   GFRNONAA 28 (L) 08/19/2018 1332   GFRAA 41 (L) 09/16/2018 0430   GFRAA 37 (L) 09/15/2018 1210   GFRAA 37 (L) 09/14/2018 1409   GFRAA 28 (L) 09/03/2018 0419   GFRAA 26 (L) 08/26/2018 1402  GFRAA 33 (L) 08/19/2018 1332   CBC    Component Value Date/Time   WBC 6.4 09/16/2018 0430   RBC 3.25 (L) 09/16/2018 0430   HGB 8.8 (L) 09/16/2018 0430   HGB 9.2 (L) 09/14/2018 1409   HGB 11.3 (L) 09/17/2017 1428   HCT 30.3 (L) 09/16/2018 0430   HCT 35.8 09/17/2017 1428   PLT 238 09/16/2018 0430   PLT 197 09/14/2018 1409   PLT 231 09/17/2017 1428   MCV 93.2 09/16/2018 0430   MCV 93.2 09/17/2017 1428   MCH 27.1 09/16/2018 0430   MCHC 29.0 (L) 09/16/2018 0430   RDW 19.9 (H) 09/16/2018 0430   RDW 16.5 (H) 09/17/2017 1428   LYMPHSABS 1.3 09/14/2018 1409   LYMPHSABS 0.7 (L) 09/17/2017 1428   MONOABS 1.0 09/14/2018 1409   MONOABS 0.5 09/17/2017 1428   EOSABS 0.1 09/14/2018 1409   EOSABS 0.1 09/17/2017 1428   BASOSABS 0.0 09/14/2018 1409   BASOSABS 0.0 09/17/2017 1428   HEPATIC Function  Panel Recent Labs    08/19/18 1332 08/26/18 1402 09/14/18 1409  PROT 7.9 8.1 8.0   HEMOGLOBIN A1C No components found for: HGA1C,  MPG CARDIAC ENZYMES Lab Results  Component Value Date   CKTOTAL 52 08/21/2017   CKMB 1.3 08/21/2017   TROPONINI 0.54 (HH) 09/02/2018   TROPONINI 0.46 (HH) 09/02/2018   TROPONINI 0.30 (HH) 09/01/2018   BNP No results for input(s): PROBNP in the last 8760 hours. TSH Recent Labs    12/13/17 2243  TSH 1.922   CHOLESTEROL Recent Labs    12/14/17 0457 09/02/18 0152  CHOL 167 143    Scheduled Meds: . acyclovir  400 mg Oral BID  . apixaban  2.5 mg Oral BID  . aspirin EC  81 mg Oral Daily  . atorvastatin  80 mg Oral q1800  . calcium carbonate  1 tablet Oral Q breakfast  . diltiazem  30 mg Oral Q12H  . feeding supplement (ENSURE ENLIVE)  237 mL Oral BID BM  . ferrous sulfate  325 mg Oral Q breakfast  . hydrALAZINE  12.5 mg Oral BID  . isosorbide mononitrate  30 mg Oral Daily  . pantoprazole  40 mg Oral Daily  . potassium chloride SA  20 mEq Oral BID  . pyridostigmine  30 mg Oral BID WC  . ticagrelor  60 mg Oral BID  . tiZANidine  4 mg Oral QHS   Continuous Infusions: PRN Meds:.nitroGLYCERIN, traMADol  Assessment/Plan: Atrial fibrillation, paroxysmal CAD Anemia of chronic disease and GI blood loss Multiple myeloma, not in remission Chest pain Stents in LAD and LCx. CKD III Hypertension Moderate MR and TR H/O DVT  Try Eliquis 2.5 mg. bid. Diltiazem 30 mg. bid. Increase activity.   LOS: 0 days    [name]  MD  09/16/2018, 10:44 AM

## 2018-09-17 ENCOUNTER — Telehealth: Payer: Self-pay | Admitting: Oncology

## 2018-09-17 MED ORDER — ADULT MULTIVITAMIN W/MINERALS CH
1.0000 | ORAL_TABLET | Freq: Every day | ORAL | Status: DC
  Administered 2018-09-17: 1 via ORAL
  Filled 2018-09-17: qty 1

## 2018-09-17 MED ORDER — PANTOPRAZOLE SODIUM 40 MG PO TBEC: 40.0000 mg | DELAYED_RELEASE_TABLET | Freq: Two times a day (BID) | ORAL | 3 refills | Status: DC

## 2018-09-17 MED ORDER — ENSURE ENLIVE PO LIQD: 237.0000 mL | Freq: Two times a day (BID) | ORAL | 12 refills | Status: DC

## 2018-09-17 MED ORDER — DILTIAZEM HCL 30 MG PO TABS: 30.0000 mg | ORAL_TABLET | Freq: Two times a day (BID) | ORAL | 3 refills | Status: DC

## 2018-09-17 MED ORDER — ENSURE ENLIVE PO LIQD: 237.0000 mL | Freq: Three times a day (TID) | ORAL | Status: DC

## 2018-09-17 MED ORDER — APIXABAN 2.5 MG PO TABS: 2.5000 mg | ORAL_TABLET | Freq: Two times a day (BID) | ORAL | 3 refills | Status: DC

## 2018-09-17 MED ORDER — SODIUM CHLORIDE 0.9 % IV SOLN
INTRAVENOUS | Status: DC
  Administered 2018-09-17 (×2): 100 mL/h via INTRAVENOUS

## 2018-09-17 MED ORDER — POTASSIUM CHLORIDE CRYS ER 20 MEQ PO TBCR: 20.0000 meq | EXTENDED_RELEASE_TABLET | Freq: Two times a day (BID) | ORAL | 6 refills | Status: DC

## 2018-09-17 MED ORDER — PANTOPRAZOLE SODIUM 40 MG PO TBEC
40.0000 mg | DELAYED_RELEASE_TABLET | Freq: Two times a day (BID) | ORAL | Status: DC
  Administered 2018-09-17: 40 mg via ORAL
  Filled 2018-09-17: qty 1

## 2018-09-17 NOTE — Telephone Encounter (Signed)
Faxed medical records to [facility] at [phone], Release [id number]

## 2018-09-17 NOTE — Discharge Instructions (Signed)

## 2018-09-17 NOTE — Progress Notes (Signed)
Initial Nutrition Assessment  DOCUMENTATION CODES:   Non-severe (moderate) malnutrition in context of chronic illness  INTERVENTION:   -Increase Ensure Enlive po to TID, each supplement provides 350 kcal and 20 grams of protein -MVI with minerals daily  NUTRITION DIAGNOSIS:   Moderate Malnutrition related to chronic illness(multiple myeloma) as evidenced by mild fat depletion, moderate fat depletion, mild muscle depletion, moderate muscle depletion, energy intake < or equal to 75% for > or equal to 1 month, percent weight loss.  GOAL:   Patient will meet greater than or equal to 90% of their needs  MONITOR:   PO intake, Supplement acceptance, Labs, Weight trends, Skin, I & O's  REASON FOR ASSESSMENT:   Malnutrition Screening Tool    ASSESSMENT:   72 year old female presented with chest pain, shortness of breath and atrial fibrillation with RVR.   Pt admitted with chest pain and a-fib.   Spoke with pt and ps sister at bedside. Pt reports decreased appetite over the past 3 weeks, which occurred after undergoing heart cath. Prior to this, pt report she was "eating great- eating all of the time". Of note, pt shares that she just had a recurrence of cancer and is about to undergo her second round of cancer treatments this week. She has been experiencing nausea, especially with hot food odors. Noted meal completion 0% (pt shares she consumed 100% of cold cereal and cup of peaches this morning).   PTA, pt was consuming 2 meals per day (Breakfast 50% of egg and sausage patty and 50% of a meal of meat, starch, and vegetable). Pt also shares she consumes 2-3 Ensure supplements daily, depending on intake.   Pt endorses "a lot" of weight loss, especially over the past month (per wt hx, pt was experienced a 10.5% wt loss over the past month, which is significant for time frame). Per pt, wt fluctuates at baseline due to variable appetite, however, is usually around 186#.   Discussed with pt  importance of good meal and supplement intake to promote healing. Encouraged pt to order cold foods to help control nausea exacerbated by food odors. Encouraged pt to continue supplements at home.   Labs reviewed.   NUTRITION - FOCUSED PHYSICAL EXAM:    Most Recent Value  Orbital Region  Mild depletion  Upper Arm Region  Moderate depletion  Thoracic and Lumbar Region  No depletion  Buccal Region  No depletion  Temple Region  Mild depletion  Clavicle Bone Region  No depletion  Clavicle and Acromion Bone Region  No depletion  Scapular Bone Region  No depletion  Dorsal Hand  Moderate depletion  Patellar Region  Moderate depletion  Anterior Thigh Region  Moderate depletion  Posterior Calf Region  Moderate depletion  Edema (RD Assessment)  Mild  Hair  Reviewed  Eyes  Reviewed  Mouth  Reviewed  Skin  Reviewed  Nails  Reviewed       Diet Order:   Diet Order            Diet Heart Room service appropriate? Yes; Fluid consistency: Thin  Diet effective now              EDUCATION NEEDS:   Education needs have been addressed  Skin:  Skin Assessment: Reviewed RN Assessment  Last BM:  09/14/18  Height:   Ht Readings from Last 1 Encounters:  09/16/18 5' 5.5" (1.664 m)    Weight:   Wt Readings from Last 1 Encounters:  09/17/18 71.3 kg  Ideal Body Weight:  58 kg  BMI:  Body mass index is 25.75 kg/m.  Estimated Nutritional Needs:   Kcal:  1410-3013  Protein:  90-105 grams  Fluid:  > 1.7 L    Eirene Rather A. Jimmye Norman, RD, LDN, CDE Pager: [phone] After hours Pager: [phone]

## 2018-09-17 NOTE — Plan of Care (Signed)
  Problem: Health Behavior/Discharge Planning: Goal: Ability to manage health-related needs will improve 09/17/2018 1739 by Tristan Schroeder, RN Outcome: Adequate for Discharge 09/17/2018 1159 by Tristan Schroeder, RN Outcome: Progressing

## 2018-09-17 NOTE — Progress Notes (Signed)
Patient ready for discharge. 

## 2018-09-17 NOTE — Plan of Care (Signed)
  Problem: Education: Goal: Knowledge of General Education information will improve Description Including pain rating scale, medication(s)/side effects and non-pharmacologic comfort measures Outcome: Progressing   Problem: Health Behavior/Discharge Planning: Goal: Ability to manage health-related needs will improve Outcome: Progressing   

## 2018-09-17 NOTE — Discharge Summary (Signed)
Physician Discharge Summary  Patient ID: Abigail [name] MRN: [id number] DOB/AGE: 71-Nov-1947 71 y.o.  Admit date: 09/15/2018 Discharge date: 09/17/2018  Admission Diagnoses: Chest pain, musculoskeletal Atrial fibrillation [id number] score of 3 Anemia of chronic disease and GI blood loss CAD Stents in LAD, LCx Multiple myeloma History of DVT  CKD, 3 Hypertension Moderate MR and TR  Discharge Diagnoses:  Principal problem: Paroxysmal atrial fibrillation Active Problems:   Precordial chest pain, musculoskeletal   Multivessel coronary artery disease   Status post stents in LAD and LCx   Hypertension   Moderate MR and TR   History of DVT   CKD 3   Multiple myeloma, not in remission   Anemia of chronic disease and GI blood loss  Discharged Condition: fair  Hospital Course: 72 year old female with a past medical history of multivessel coronary artery disease, status post stents in LAD and LCx, chronic kidney disease stage III, multiple myeloma not in remission and hypertension had recurrent chest pain and shortness of breath with palpitation.  Her recent cardiac catheterization had showed patent stent in LAD and LCx and moderate multivessel coronary artery disease.  Monitoring emergency room showed atrial fibrillation with RVR.  Patient converted to sinus rhythm spontaneously. She has chronically low hemoglobin ranging between 8 to 10 g/mL, chronically elevated creatinine and recurrent hypokalemia.  Her chest pain was reproducible on palpation.   She is also off her chemotherapy for multiple myeloma due to renal dysfunction.  With a history of anemia and recurrent GI blood loss patient was started on low-dose apixaban with discontinuation of aspirin and Brilinta.  Patient had poor oral intake with nausea and stomach discomfort. She responded to tramadol use and her Protonix was increased to twice daily.  She was discharged home in stable condition with follow-up by me in 1 week and by  primary care physician in 1 month.  Consults: cardiology  Significant Diagnostic Studies: labs: Hgb 8.8 to 10.7. INR-1.2. Potassium 3.1 mmol, creatinine 1.62.  Treatments: cardiac meds: diltiazem, atorvastatin, Isosorbide, SL NTG and apixaban.  Discharge Exam: Blood pressure 116/62, pulse (!) 56, temperature 99.5 F (37.5 C), temperature source Oral, resp. rate 17, height 5' 5.5" (1.664 m), weight 71.3 kg, SpO2 99 %. General appearance: alert, cooperative and appears stated age. Head: Normocephalic, atraumatic. Eyes: Brown eyes, pale conjunctiva, corneas clear. PERRL, EOM's intact.  Neck: No adenopathy, no carotid bruit, no JVD, supple, symmetrical, trachea midline and thyroid not enlarged. Resp: Clear to auscultation bilaterally. Chest wall tender on palpation. Cardio: Regular rate and rhythm, S1, S2 normal, II/VI systolic murmur, no click, rub or gallop. GI: Soft, non-tender; bowel sounds normal; no organomegaly. Extremities: No edema, cyanosis or clubbing. Skin: Warm and dry.  Neurologic: Alert and oriented X 3, normal strength and tone. Normal coordination and slow gait.  Disposition: Discharge disposition: 01-Home or Self Care        Allergies as of 09/17/2018      Reactions   Sulfa Antibiotics Rash      Medication List    STOP taking these medications   aspirin EC 81 MG tablet   BRILINTA 60 MG Tabs tablet Generic drug:  ticagrelor   cyclophosphamide 50 MG capsule Commonly known as:  CYTOXAN   dexamethasone 4 MG tablet Commonly known as:  DECADRON   hydrALAZINE 25 MG tablet Commonly known as:  APRESOLINE     TAKE these medications   acyclovir 400 MG tablet Commonly known as:  ZOVIRAX TAKE 1 TABLET (400 MG TOTAL) BY MOUTH  2 (TWO) TIMES DAILY.   apixaban 2.5 MG Tabs tablet Commonly known as:  ELIQUIS Take 1 tablet (2.5 mg total) by mouth 2 (two) times daily.   atorvastatin 80 MG tablet Commonly known as:  LIPITOR Take 1 tablet (80 mg total) by mouth  daily at 6 PM.   calcium carbonate 1250 (500 Ca) MG tablet Commonly known as:  OS-CAL - dosed in mg of elemental calcium Take 1 tablet by mouth daily with breakfast.   diltiazem 30 MG tablet Commonly known as:  CARDIZEM Take 1 tablet (30 mg total) by mouth every 12 (twelve) hours.   feeding supplement (ENSURE ENLIVE) Liqd Take 237 mLs by mouth 2 (two) times daily between meals.   ferrous sulfate 325 (65 FE) MG tablet Take 1 tablet (325 mg total) by mouth daily with breakfast.   isosorbide mononitrate 30 MG 24 hr tablet Commonly known as:  IMDUR Take 1 tablet (30 mg total) by mouth daily.   nitroGLYCERIN 0.4 MG SL tablet Commonly known as:  NITROSTAT Place 0.4 mg under the tongue every 5 (five) minutes as needed for chest pain.   pantoprazole 40 MG tablet Commonly known as:  PROTONIX Take 1 tablet (40 mg total) by mouth 2 (two) times daily before a meal. What changed:  when to take this   potassium chloride SA 20 MEQ tablet Commonly known as:  K-DUR,KLOR-CON Take 1 tablet (20 mEq total) by mouth 2 (two) times daily. What changed:    how much to take  how to take this  when to take this  additional instructions   pyridostigmine 60 MG tablet Commonly known as:  MESTINON Take 0.5 tablets (30 mg total) by mouth 2 (two) times daily.   tiZANidine 4 MG tablet Commonly known as:  ZANAFLEX Take 4 mg by mouth 2 (two) times daily as needed.   traMADol 50 MG tablet Commonly known as:  ULTRAM Take 1 tablet by mouth every 6 (six) hours as needed.      Follow-up Information    Nolene Ebbs, MD. Schedule an appointment as soon as possible for a visit in 1 month(s).   Specialty:  Internal Medicine Contact information: [place] [address] [phone]        [name], MD. Schedule an appointment as soon as possible for a visit in 1 week(s).   Specialty:  Cardiology Contact information: [place]  [address] [phone]           Signed: Birdie Riddle 09/17/2018, 2:59 PM

## 2018-09-24 ENCOUNTER — Emergency Department (HOSPITAL_COMMUNITY): Payer: Medicare HMO

## 2018-09-24 ENCOUNTER — Encounter (HOSPITAL_COMMUNITY): Payer: Self-pay | Admitting: Emergency Medicine

## 2018-09-24 ENCOUNTER — Inpatient Hospital Stay (HOSPITAL_BASED_OUTPATIENT_CLINIC_OR_DEPARTMENT_OTHER): Payer: Medicare HMO | Admitting: Medical

## 2018-09-24 ENCOUNTER — Other Ambulatory Visit: Payer: Self-pay | Admitting: Medical

## 2018-09-24 ENCOUNTER — Emergency Department (HOSPITAL_COMMUNITY)
Admission: EM | Admit: 2018-09-24 | Discharge: 2018-09-24 | Disposition: A | Discharge status: Home / Self Care | Payer: Medicare HMO | Attending: Emergency Medicine | Admitting: Emergency Medicine

## 2018-09-24 ENCOUNTER — Inpatient Hospital Stay: Payer: Medicare HMO | Attending: Nurse Practitioner

## 2018-09-24 ENCOUNTER — Inpatient Hospital Stay: Payer: Medicare HMO

## 2018-09-24 ENCOUNTER — Other Ambulatory Visit: Payer: Self-pay | Admitting: *Deleted

## 2018-09-24 VITALS — BP 134/81 | HR 68 | Temp 98.8°F | Resp 18

## 2018-09-24 DIAGNOSIS — Z79899 Other long term (current) drug therapy: Secondary | ICD-10-CM | POA: Diagnosis not present

## 2018-09-24 DIAGNOSIS — I129 Hypertensive chronic kidney disease with stage 1 through stage 4 chronic kidney disease, or unspecified chronic kidney disease: Secondary | ICD-10-CM | POA: Insufficient documentation

## 2018-09-24 DIAGNOSIS — I2510 Atherosclerotic heart disease of native coronary artery without angina pectoris: Secondary | ICD-10-CM | POA: Insufficient documentation

## 2018-09-24 DIAGNOSIS — I10 Essential (primary) hypertension: Secondary | ICD-10-CM | POA: Insufficient documentation

## 2018-09-24 DIAGNOSIS — Z7901 Long term (current) use of anticoagulants: Secondary | ICD-10-CM | POA: Insufficient documentation

## 2018-09-24 DIAGNOSIS — R112 Nausea with vomiting, unspecified: Secondary | ICD-10-CM

## 2018-09-24 DIAGNOSIS — F17210 Nicotine dependence, cigarettes, uncomplicated: Secondary | ICD-10-CM | POA: Insufficient documentation

## 2018-09-24 DIAGNOSIS — N183 Chronic kidney disease, stage 3 (moderate): Secondary | ICD-10-CM

## 2018-09-24 DIAGNOSIS — Z8579 Personal history of other malignant neoplasms of lymphoid, hematopoietic and related tissues: Secondary | ICD-10-CM | POA: Insufficient documentation

## 2018-09-24 DIAGNOSIS — Z5112 Encounter for antineoplastic immunotherapy: Secondary | ICD-10-CM | POA: Insufficient documentation

## 2018-09-24 DIAGNOSIS — Z955 Presence of coronary angioplasty implant and graft: Secondary | ICD-10-CM | POA: Insufficient documentation

## 2018-09-24 DIAGNOSIS — D630 Anemia in neoplastic disease: Secondary | ICD-10-CM | POA: Insufficient documentation

## 2018-09-24 DIAGNOSIS — C9002 Multiple myeloma in relapse: Secondary | ICD-10-CM

## 2018-09-24 DIAGNOSIS — Z86718 Personal history of other venous thrombosis and embolism: Secondary | ICD-10-CM

## 2018-09-24 DIAGNOSIS — C9000 Multiple myeloma not having achieved remission: Secondary | ICD-10-CM

## 2018-09-24 DIAGNOSIS — I4891 Unspecified atrial fibrillation: Secondary | ICD-10-CM | POA: Insufficient documentation

## 2018-09-24 LAB — URINALYSIS, ROUTINE W REFLEX MICROSCOPIC
Bacteria, UA: NONE SEEN
Bilirubin Urine: NEGATIVE
Glucose, UA: 50 mg/dL — AB
Ketones, ur: 5 mg/dL — AB
Leukocytes, UA: NEGATIVE
Nitrite: NEGATIVE
Protein, ur: 100 mg/dL — AB
SPECIFIC GRAVITY, URINE: 1.014 (ref 1.005–1.030)
pH: 5 (ref 5.0–8.0)

## 2018-09-24 LAB — CMP (CANCER CENTER ONLY)
ALBUMIN: 4.1 g/dL (ref 3.5–5.0)
ALT: 7 U/L (ref 0–44)
ANION GAP: 16 — AB (ref 5–15)
AST: 21 U/L (ref 15–41)
Alkaline Phosphatase: 90 U/L (ref 38–126)
BILIRUBIN TOTAL: 0.8 mg/dL (ref 0.3–1.2)
BUN: 8 mg/dL (ref 8–23)
CHLORIDE: 108 mmol/L (ref 98–111)
CO2: 16 mmol/L — ABNORMAL LOW (ref 22–32)
Calcium: 9.3 mg/dL (ref 8.9–10.3)
Creatinine: 1.78 mg/dL — ABNORMAL HIGH (ref 0.44–1.00)
GFR, Est AFR Am: 33 mL/min — ABNORMAL LOW (ref >60)
GFR, Estimated: 28 mL/min — ABNORMAL LOW (ref >60)
GLUCOSE: 116 mg/dL — AB (ref 70–99)
POTASSIUM: 3.7 mmol/L (ref 3.5–5.1)
Sodium: 140 mmol/L (ref 135–145)
TOTAL PROTEIN: 9.2 g/dL — AB (ref 6.5–8.1)

## 2018-09-24 LAB — COMPREHENSIVE METABOLIC PANEL
ALT: 12 U/L (ref 0–44)
ANION GAP: 13 (ref 5–15)
AST: 23 U/L (ref 15–41)
Albumin: 4.1 g/dL (ref 3.5–5.0)
Alkaline Phosphatase: 73 U/L (ref 38–126)
BUN: 8 mg/dL (ref 8–23)
CALCIUM: 9.2 mg/dL (ref 8.9–10.3)
CO2: 20 mmol/L — ABNORMAL LOW (ref 22–32)
Chloride: 104 mmol/L (ref 98–111)
Creatinine, Ser: 1.74 mg/dL — ABNORMAL HIGH (ref 0.44–1.00)
GFR calc Af Amer: 34 mL/min — ABNORMAL LOW (ref >60)
GFR calc non Af Amer: 29 mL/min — ABNORMAL LOW (ref >60)
Glucose, Bld: 140 mg/dL — ABNORMAL HIGH (ref 70–99)
Potassium: 3.7 mmol/L (ref 3.5–5.1)
SODIUM: 137 mmol/L (ref 135–145)
Total Bilirubin: 0.9 mg/dL (ref 0.3–1.2)
Total Protein: 8.6 g/dL — ABNORMAL HIGH (ref 6.5–8.1)

## 2018-09-24 LAB — CBC
HCT: 34.6 % — ABNORMAL LOW (ref 36.0–46.0)
Hemoglobin: 10.5 g/dL — ABNORMAL LOW (ref 12.0–15.0)
MCH: 28.2 pg (ref 26.0–34.0)
MCHC: 30.3 g/dL (ref 30.0–36.0)
MCV: 92.8 fL (ref 80.0–100.0)
PLATELETS: 269 10*3/uL (ref 150–400)
RBC: 3.73 MIL/uL — ABNORMAL LOW (ref 3.87–5.11)
RDW: 19.9 % — ABNORMAL HIGH (ref 11.5–15.5)
WBC: 7.9 10*3/uL (ref 4.0–10.5)
nRBC: 0 % (ref 0.0–0.2)

## 2018-09-24 LAB — CBC WITH DIFFERENTIAL (CANCER CENTER ONLY)
ABS IMMATURE GRANULOCYTES: 0.05 10*3/uL (ref 0.00–0.07)
BASOS PCT: 1 %
Basophils Absolute: 0 10*3/uL (ref 0.0–0.1)
Eosinophils Absolute: 0 10*3/uL (ref 0.0–0.5)
Eosinophils Relative: 0 %
HCT: 34.4 % — ABNORMAL LOW (ref 36.0–46.0)
HEMOGLOBIN: 10.4 g/dL — AB (ref 12.0–15.0)
Immature Granulocytes: 1 %
LYMPHS PCT: 9 %
Lymphs Abs: 0.7 10*3/uL (ref 0.7–4.0)
MCH: 28.4 pg (ref 26.0–34.0)
MCHC: 30.2 g/dL (ref 30.0–36.0)
MCV: 94 fL (ref 80.0–100.0)
MONO ABS: 0.3 10*3/uL (ref 0.1–1.0)
MONOS PCT: 4 %
NEUTROS ABS: 6.5 10*3/uL (ref 1.7–7.7)
NEUTROS PCT: 85 %
PLATELETS: 219 10*3/uL (ref 150–400)
RBC: 3.66 MIL/uL — AB (ref 3.87–5.11)
RDW: 19.9 % — ABNORMAL HIGH (ref 11.5–15.5)
WBC: 7.6 10*3/uL (ref 4.0–10.5)
nRBC: 0 % (ref 0.0–0.2)

## 2018-09-24 LAB — LIPASE, BLOOD: Lipase: 32 U/L (ref 11–51)

## 2018-09-24 MED ORDER — ONDANSETRON HCL 8 MG PO TABS
ORAL_TABLET | ORAL | Status: AC
  Filled 2018-09-24: qty 1

## 2018-09-24 MED ORDER — BORTEZOMIB CHEMO SQ INJECTION 3.5 MG (2.5MG/ML)
1.5000 mg/m2 | Freq: Once | INTRAMUSCULAR | Status: AC
  Administered 2018-09-24: 2.75 mg via SUBCUTANEOUS
  Filled 2018-09-24: qty 1.1

## 2018-09-24 MED ORDER — SODIUM CHLORIDE 0.9 % IV SOLN
Freq: Once | INTRAVENOUS | Status: DC
  Filled 2018-09-24: qty 250

## 2018-09-24 MED ORDER — SODIUM CHLORIDE 0.9 % IV BOLUS
1000.0000 mL | Freq: Once | INTRAVENOUS | Status: AC
  Administered 2018-09-24: 1000 mL via INTRAVENOUS

## 2018-09-24 MED ORDER — ONDANSETRON HCL 8 MG PO TABS
8.0000 mg | ORAL_TABLET | Freq: Once | ORAL | Status: AC
  Administered 2018-09-24: 8 mg via ORAL

## 2018-09-24 MED ORDER — ONDANSETRON 8 MG PO TBDP: 8.0000 mg | ORAL_TABLET | Freq: Three times a day (TID) | ORAL | 0 refills | Status: DC | PRN

## 2018-09-24 MED ORDER — METOCLOPRAMIDE HCL 5 MG/ML IJ SOLN
10.0000 mg | Freq: Once | INTRAMUSCULAR | Status: AC
  Administered 2018-09-24: 10 mg via INTRAVENOUS
  Filled 2018-09-24: qty 2

## 2018-09-24 MED ORDER — ONDANSETRON HCL 8 MG PO TABS: 8.0000 mg | ORAL_TABLET | Freq: Two times a day (BID) | ORAL | 0 refills | Status: DC | PRN

## 2018-09-24 MED ORDER — IOHEXOL 300 MG/ML  SOLN
100.0000 mL | Freq: Once | INTRAMUSCULAR | Status: AC | PRN
  Administered 2018-09-24: 80 mL via INTRAVENOUS

## 2018-09-24 MED ORDER — PROCHLORPERAZINE MALEATE 5 MG PO TABS: 5.0000 mg | ORAL_TABLET | Freq: Four times a day (QID) | ORAL | 0 refills | Status: DC | PRN

## 2018-09-24 MED ORDER — MORPHINE SULFATE (PF) 4 MG/ML IV SOLN
4.0000 mg | INTRAVENOUS | Status: DC | PRN
  Administered 2018-09-24 (×2): 4 mg via INTRAVENOUS
  Filled 2018-09-24 (×2): qty 1

## 2018-09-24 NOTE — Progress Notes (Signed)
Okay to treat with Velcade with today's labs. Will add 1L fluids NS over 2 hours per Sandi Mealy PA.  Patient stated that she was dc'd from the hospital last Friday and since then has had no appetite, nausea, and vomitting. Contacted Dr. Benay Spice and patient was assessed by Sandi Mealy PA.   IV team was called and order placed for consult Iv nurse arrived and unable to place peripheral IV. Dr. Benay Spice made aware and her orders were received for patient to drink 8 oz of fluid and if she could tolerate then okay to DC home. Patient drank 8oz fluid and then Gonzalez home. Spoke With Dr. Benay Spice RN Manuela Schwartz and made aware that patient has no antinausea medication at home. 2 prescriptions for compazine and Zofran were e-prescribed to patient pharmacy and pharmacy receipt confirmed. Patient and friend aware to pick up medications on the way home.

## 2018-09-24 NOTE — ED Notes (Signed)
Blood work has already been completed today at the cancer center.

## 2018-09-24 NOTE — ED Provider Notes (Signed)
[facility] [facility] EMERGENCY DEPARTMENT Provider Note   CSN: [id number] Arrival date & time: 09/24/18  1806     History   Chief Complaint Chief Complaint  Patient presents with  . Emesis    HPI Abigail [name] is a 72 y.o. female.  HPI Patient is a 72 year old female presents the emergency department with acute onset nausea and vomiting today.  She is currently receiving chemotherapy for multiple myeloma.  Her last dose of chemotherapy was last night.  She reporrts decreased oral intake today.  She feels weak.  No fevers.  No diarrhea.  No blood in her vomit.  Symptoms are mild to moderate in severity   Past Medical History:  Diagnosis Date  . ACS (acute coronary syndrome) ([place]) 06/2017  . Anemia    "off and on all my life" (12/22/2016)  . Anxiety   . Bilateral renal artery stenosis ([place]) 1999   s/p stenting  . Chronic kidney disease (CKD), stage III (moderate) ([place])    Archie Endo 12/22/2016  . Coronary artery disease 1999  . Depression   . Diverticulosis   . Duodenitis   . DVT (deep venous thrombosis) (H[facility])    on Coumadin  . Dyspnea   . Gastric erosions   . Gastric ulcer   . GERD (gastroesophageal reflux disease)   . Heart murmur   . History of blood transfusion ~ 03/2016   "low HgB; practically nonexistent"  . Hyperlipidemia   . Hypertension   . MI (myocardial infarction) ([place]) 1999  . Multiple myeloma ([place]) dx'd ~ 04/2016  . PAT (paroxysmal atrial tachycardia) ([place]) 12/22/2016  . Retinal hemorrhage, right eye   . Schatzki's ring   . Tachy-brady syndrome ([place])    Archie Endo 12/22/2016  . Tubular adenoma of colon     Patient Active Problem List   Diagnosis Date Noted  . Precordial chest pain 09/15/2018  . Multiple myeloma in relapse ([place]) 06/15/2018  . Goals of care, counseling/discussion 06/15/2018  . Hematochezia   . Acute blood loss anemia   . Diverticulosis of colon with hemorrhage   . GIB (gastrointestinal bleeding) 09/03/2017  . Symptomatic anemia  09/03/2017  . Acute systolic heart failure ([place]) 09/03/2017  . Chronic kidney disease (CKD), stage III (moderate) ([place]) 09/03/2017  . Hyperlipidemia 09/03/2017  . Hypertension 09/03/2017  . Multiple myeloma in remission ([place]) 09/03/2017  . GI bleed 08/17/2017  . Sepsis ([place]) 08/17/2017  . Fever 08/16/2017  . STEMI (ST elevation myocardial infarction) ([place]) 02/24/2017  . Acute anterolateral wall MI ([place]) 02/24/2017  . Dehydration 01/21/2017  . Acute coronary syndrome ([place]) 12/24/2016  . Dizziness 12/22/2016    Class: Acute  . Orthostatic hypotension 12/08/2016  . Syncope and collapse 12/06/2016  . Tachycardia-bradycardia ([place])   . Essential hypertension   . Nausea and vomiting in adult patient   . Erosive esophagitis   . Protein-calorie malnutrition, severe 11/19/2016  . LLQ pain   . LUQ abdominal pain   . Intractable nausea and vomiting 11/17/2016  . Abdominal pain 11/05/2016  . Multiple myeloma ([place]) 06/18/2016  . Malnutrition of moderate degree 06/14/2016  . 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 Exertional dyspnea 11/01/2015  . Coronary atherosclerosis of native coronary artery 11/01/2015  . Shortness of breath 11/01/2015  . Chest pain 12/31/2011  . Coronary artery disease 10/20/1997    Past Surgical History:  Procedure Laterality Date  . APPENDECTOMY    . BACK SURGERY    . CATARACT EXTRACTION  W/ INTRAOCULAR LENS IMPLANT Right 2014  . COLONOSCOPY WITH PROPOFOL N/A 09/04/2017   Procedure: COLONOSCOPY WITH PROPOFOL;  Surgeon: Doran Stabler, MD;  Location: [facility];  Service: Gastroenterology;  Laterality: N/A;  . CORONARY ANGIOGRAPHY N/A 01/23/2017   Procedure: Coronary Angiography;  Surgeon: [name], MD;  Location: [facility];  Service: Cardiovascular;  Laterality: N/A;  . CORONARY ANGIOPLASTY WITH STENT PLACEMENT  1999  . CORONARY STENT INTERVENTION N/A 12/29/2016   Procedure: Coronary Stent Intervention;  Surgeon:  Charolette Forward, MD;  Location: [facility];  Service: Cardiovascular;  Laterality: N/A;  . CORONARY STENT INTERVENTION N/A 02/24/2017   Procedure: Coronary Stent Intervention;  Surgeon: Charolette Forward, MD;  Location: [facility];  Service: Cardiovascular;  Laterality: N/A;  . ESOPHAGOGASTRODUODENOSCOPY N/A 11/03/2015   Procedure: ESOPHAGOGASTRODUODENOSCOPY (EGD);  Surgeon: Carol Ada, MD;  Location: [facility];  Service: Endoscopy;  Laterality: N/A;  . ESOPHAGOGASTRODUODENOSCOPY N/A 09/03/2017   Procedure: ESOPHAGOGASTRODUODENOSCOPY (EGD);  Surgeon: Doran Stabler, MD;  Location: [facility];  Service: Gastroenterology;  Laterality: N/A;  . ESOPHAGOGASTRODUODENOSCOPY (EGD) WITH PROPOFOL N/A 11/18/2016   Procedure: ESOPHAGOGASTRODUODENOSCOPY (EGD) WITH PROPOFOL;  Surgeon: Ladene Artist, MD;  Location: [facility];  Service: Endoscopy;  Laterality: N/A;  . LEFT HEART CATH AND CORONARY ANGIOGRAPHY N/A 12/24/2016   Procedure: Left Heart Cath and Coronary Angiography;  Surgeon: [name], MD;  Location: [facility];  Service: Cardiovascular;  Laterality: N/A;  . LEFT HEART CATH AND CORONARY ANGIOGRAPHY N/A 02/24/2017   Procedure: Left Heart Cath and Coronary Angiography;  Surgeon: Charolette Forward, MD;  Location: [facility];  Service: Cardiovascular;  Laterality: N/A;  . LEFT HEART CATH AND CORONARY ANGIOGRAPHY N/A 03/05/2017   Procedure: Left Heart Cath and Coronary Angiography;  Surgeon: [name], MD;  Location: [facility];  Service: Cardiovascular;  Laterality: N/A;  . LEFT HEART CATH AND CORONARY ANGIOGRAPHY N/A 09/02/2018   Procedure: LEFT HEART CATH AND CORONARY ANGIOGRAPHY;  Surgeon: [name], MD;  Location: [facility];  Service: Cardiovascular;  Laterality: N/A;  . LUMBAR [place]    . RENAL ARTERY STENT  1999   bil RAS so ? bil vs unilateral stents  . RETINAL LASER PROCEDURE Right 2012   "bleeding"  . TONSILLECTOMY    . TOTAL  ABDOMINAL HYSTERECTOMY       OB History   None      Home Medications    Prior to Admission medications   Medication Sig Start Date End Date Taking? Authorizing Provider  acyclovir (ZOVIRAX) 400 MG tablet TAKE 1 TABLET (400 MG TOTAL) BY MOUTH 2 (TWO) TIMES DAILY. 08/29/16  Yes Ladell Pier, MD  apixaban (ELIQUIS) 2.5 MG TABS tablet Take 1 tablet (2.5 mg total) by mouth 2 (two) times daily. 09/17/18  Yes [name], MD  atorvastatin (LIPITOR) 80 MG tablet Take 1 tablet (80 mg total) by mouth daily at 6 PM. 03/01/17  Yes [name], MD  calcium carbonate (OS-CAL - DOSED IN MG OF ELEMENTAL CALCIUM) 1250 (500 Ca) MG tablet Take 1 tablet by mouth daily with breakfast.   Yes [provider]  feeding supplement, ENSURE ENLIVE, (ENSURE ENLIVE) LIQD Take 237 mLs by mouth 2 (two) times daily between meals. Patient taking differently: Take 237 mLs by mouth See admin instructions. Drink one bottle (237 ml) by mouth one or two times daily as a meal supplement 09/17/18  Yes [name], MD  ferrous  sulfate 325 (65 FE) MG tablet Take 1 tablet (325 mg total) by mouth daily with breakfast. 03/02/17  Yes [name], MD  isosorbide mononitrate (IMDUR) 30 MG 24 hr tablet Take 1 tablet (30 mg total) by mouth daily. 09/04/18  Yes [name], MD  nitroGLYCERIN (NITROSTAT) 0.4 MG SL tablet Place 0.4 mg under the tongue every 5 (five) minutes as needed for chest pain. 03/20/17  Yes [provider]  pantoprazole (PROTONIX) 40 MG tablet Take 1 tablet (40 mg total) by mouth 2 (two) times daily before a meal. 09/17/18  Yes [name], MD  potassium chloride SA (K-DUR,KLOR-CON) 20 MEQ tablet Take 1 tablet (20 mEq total) by mouth 2 (two) times daily. Patient taking differently: Take 20 mEq by mouth daily.  09/17/18  Yes [name], MD  pyridostigmine (MESTINON) 60 MG tablet Take 0.5 tablets (30 mg total) by mouth 2 (two) times daily. 01/25/17  Yes [name], MD  tiZANidine  (ZANAFLEX) 4 MG tablet Take 4 mg by mouth at bedtime as needed for muscle spasms.  04/06/18  Yes [provider]  traMADol (ULTRAM) 50 MG tablet Take 50 mg by mouth every 6 (six) hours as needed for severe pain.  04/06/18  Yes [provider]  diltiazem (CARDIZEM) 30 MG tablet Take 1 tablet (30 mg total) by mouth every 12 (twelve) hours. 09/17/18   [name], MD  ondansetron (ZOFRAN ODT) 8 MG disintegrating tablet Take 1 tablet (8 mg total) by mouth every 8 (eight) hours as needed for nausea or vomiting. 09/24/18   Jola Schmidt, MD  ondansetron (ZOFRAN) 8 MG tablet Take 1 tablet (8 mg total) by mouth 2 (two) times daily as needed for nausea or vomiting. 09/24/18   Ladell Pier, MD  prochlorperazine (COMPAZINE) 5 MG tablet Take 1 tablet (5 mg total) by mouth every 6 (six) hours as needed for nausea or vomiting. 09/24/18   Ladell Pier, MD  ISOSORBIDE DINITRATE PO Take by mouth.  12/30/11  [provider]    Family History Family History  Problem Relation Age of Onset  . Breast cancer Mother   . Breast cancer Sister   . Breast cancer Maternal Aunt   . Colon cancer Neg Hx     Social History Social History   Tobacco Use  . Smoking status: Current Every Day Smoker    Packs/day: 0.50    Years: 50.00    Pack years: 25.00    Types: Cigarettes  . Smokeless tobacco: Never Used  Substance Use Topics  . Alcohol use: No    Alcohol/week: 0.0 standard drinks  . Drug use: No     Allergies   Sulfa antibiotics   Review of Systems Review of Systems  All other systems reviewed and are negative.    Physical Exam Updated Vital Signs BP (!) 190/81   Pulse 69   Temp 99.6 F (37.6 C) (Oral)   Resp 16   Ht _0  (1.651 m)   Wt 73.5 kg   SpO2 97%   BMI 26.96 kg/m   Physical Exam  Constitutional: She is oriented to person, place, and time. She appears well-developed and well-nourished. No distress.  HENT:  Head: Normocephalic and atraumatic.  Eyes:  EOM are normal.  Neck: Normal range of motion.  Cardiovascular: Normal rate, regular rhythm and normal heart sounds.  Pulmonary/Chest: Effort normal and breath sounds normal.  Abdominal: Soft. She exhibits no distension. There is no tenderness.  Musculoskeletal: Normal range of motion.  Neurological: She is alert and oriented to person, place, and time.  Skin: Skin is warm and dry.  Psychiatric: She has a normal mood and affect. Judgment normal.  Nursing note and vitals reviewed.    ED Treatments / Results  Labs (all labs ordered are listed, but only abnormal results are displayed) Labs Reviewed  COMPREHENSIVE METABOLIC PANEL - Abnormal; Notable for the following components:      Result Value   CO2 20 (*)    Glucose, Bld 140 (*)    Creatinine, Ser 1.74 (*)    Total Protein 8.6 (*)    GFR calc non Af Amer 29 (*)    GFR calc Af Amer 34 (*)    All other components within normal limits  CBC - Abnormal; Notable for the following components:   RBC 3.73 (*)    Hemoglobin 10.5 (*)    HCT 34.6 (*)    RDW 19.9 (*)    All other components within normal limits  URINALYSIS, ROUTINE W REFLEX MICROSCOPIC - Abnormal; Notable for the following components:   Glucose, UA 50 (*)    Hgb urine dipstick MODERATE (*)    Ketones, ur 5 (*)    Protein, ur 100 (*)    All other components within normal limits  LIPASE, BLOOD    EKG None  Radiology Ct Abdomen Pelvis W Contrast  Result Date: 09/24/2018 CLINICAL DATA:  Vomiting since last night. On chemotherapy for multiple myeloma. History of appendectomy and hysterectomy. EXAM: CT ABDOMEN AND PELVIS WITH CONTRAST TECHNIQUE: Multidetector CT imaging of the abdomen and pelvis was performed using the standard protocol following bolus administration of intravenous contrast. CONTRAST:  82m OMNIPAQUE IOHEXOL 300 MG/ML  SOLN COMPARISON:  CT abdomen and pelvis August 16, 2017 FINDINGS: LOWER CHEST: RIGHT middle lobe atelectasis/scarring. Heart size is  upper limits of normal, incompletely evaluated. No pericardial effusion. HEPATOBILIARY: Liver and gallbladder are normal. PANCREAS: Normal. SPLEEN: Normal. ADRENALS/URINARY TRACT: Kidneys are orthotopic, demonstrating symmetric enhancement. Focal scarring lower pole RIGHT kidney. Too small to characterize hypodensities bilateral kidneys. At least 3 homogeneously hypodense benign-appearing cyst measured to 2.2 cm. No nephrolithiasis, hydronephrosis or solid renal masses. The unopacified ureters are normal in course and caliber. Delayed imaging through the kidneys demonstrates symmetric prompt contrast excretion within the proximal urinary collecting system. Urinary bladder is decompressed and unremarkable. Normal adrenal glands. STOMACH/BOWEL: Small hiatal hernia. Mild distal stomach wall thickening and edema. The small and large bowel are normal in course and caliber without inflammatory changes, sensitivity decreased without oral contrast. Mild descending and sigmoid colonic diverticulosis. A few additional scattered colonic diverticula present. VASCULAR/LYMPHATIC: Aortoiliac vessels are normal in course and caliber. Bilateral renal artery stents. Moderate calcific atherosclerosis with potential SMA stenosis. No lymphadenopathy by CT size criteria. REPRODUCTIVE: Status post hysterectomy. OTHER: No intraperitoneal free fluid or free air. MUSCULOSKELETAL: Nonacute. Grade 1 T12-L1 and L1-2 retrolisthesis. Stable rounded lucency T12 with new central sclerosis most compatible with treated metastasis. Stable severe L5-S1 spondylosis. Old coccyx fracture. IMPRESSION: 1. Mild gastritis. 2. Colonic diverticulosis without acute diverticulitis. Aortic Atherosclerosis (ICD10-I70.0). Electronically Signed   By: CElon AlasM.D.   On: 09/24/2018 21:16    Procedures Procedures (including critical care time)  Medications Ordered in ED Medications  morphine 4 MG/ML injection 4 mg (4 mg Intravenous Given 09/24/18 2006)    metoCLOPramide (REGLAN) injection 10 mg (10 mg Intravenous Given 09/24/18 2006)  sodium chloride 0.9 % bolus 1,000 mL (1,000 mLs Intravenous New Bag/Given 09/24/18 2018)  iohexol (  OMNIPAQUE) 300 MG/ML solution 100 mL (80 mLs Intravenous Contrast Given 09/24/18 2043)     Initial Impression / Assessment and Plan / ED Course  I have reviewed the triage vital signs and the nursing notes.  Pertinent labs & imaging results that were available during my care of the patient were reviewed by me and considered in my medical decision making (see chart for details).     10:50 PM Feels much better at this time.  Hydrated.  CT scan demonstrates gastritis but no other intra-abdominal pathology.  Likely nausea or vomiting associated to her chemotherapy.  Home with a prescription for Zofran.  Instructed to return to the [facility] long emergency department tomorrow for any new or worsening symptoms.  Primary care follow-up and oncology follow-up.  Final Clinical Impressions(s) / ED Diagnoses   Final diagnoses:  Nausea and vomiting, intractability of vomiting not specified, unspecified vomiting type    ED Discharge Orders         Ordered    ondansetron (ZOFRAN ODT) 8 MG disintegrating tablet  Every 8 hours PRN     09/24/18 2248           Jola Schmidt, MD 09/24/18 2251

## 2018-09-24 NOTE — ED Triage Notes (Signed)
Pt reports emesis onset last night, some relief today then continual since leaving chemo tx, pt is starting 4th round of chemo for multiple myeloma.  Pt does not have a port.

## 2018-09-24 NOTE — Discharge Instructions (Signed)
Return the emergency department for any new or worsening symptoms

## 2018-09-27 NOTE — Progress Notes (Signed)
[facility] Progress Note   Abigail [name] [id number] [birth year] 72 y.o.  Abigail [name] is managed by Dr. Dominica Severin B. Sherrill  Actively treated with chemotherapy/immunotherapy/hormonal therapy: yes  Current Therapy: Velcade   Assessment: Plan:    Non-intractable vomiting with nausea, unspecified vomiting type  Multiple myeloma in relapse (HCC)   Non-intractable nausea and vomiting: The patient was given 1 L of normal saline today along with Zofran.  She was also given a prescription for Zofran 8 mg p.o. twice daily PRN for nausea and Compazine 5 mg every 6 hours as needed for nausea or vomiting.  Multiple myeloma in relapse: The patient is currently being treated with Velcade under the direction of Dr. Dominica Severin B. Sherrill.  Please see After Visit Summary for patient specific instructions.  Future Appointments  Date Time Provider [place]  09/30/2018 10:00 AM [facility] ONLY [facility] None  09/30/2018 10:30 AM Ladell Pier, MD [facility] None  09/30/2018 11:45 AM [facility] INFUSION [facility] None  10/07/2018 10:30 AM [facility] None  10/07/2018 11:30 AM [facility] INFUSION [facility] None    No orders of the defined types were placed in this encounter.      Subjective:   Patient ID:  Abigail [name] is a 72 y.o. (DOB 1946-06-04) female.  Chief Complaint: No chief complaint on file.   HPI Abigail [name] is a 72 year old female with a diagnosis of a relapsed multiple myeloma who is managed by Dr. Dominica Severin B. Sherrill and is treated with Velcade.  She was most recently seen in the emergency room on 09/15/2018 for A. fib with a rapid ventricular response.  She reports that she has had nausea and vomiting since being discharged from the hospital on 09/15/2018.  She reports that she did not have any nausea medicines at home.  She continues to have mild nausea but no vomiting today.  She denies fevers, chills, or sweats.  Medications: I have  reviewed the patient's current medications.  Allergies:  Allergies  Allergen Reactions  . Sulfa Antibiotics Rash    Past Medical History:  Diagnosis Date  . ACS (acute coronary syndrome) ([place]) 06/2017  . Anemia    "off and on all my life" (12/22/2016)  . Anxiety   . Bilateral renal artery stenosis ([place]) 1999   s/p stenting  . Chronic kidney disease (CKD), stage III (moderate) ([place])    Archie Endo 12/22/2016  . Coronary artery disease 1999  . Depression   . Diverticulosis   . Duodenitis   . DVT (deep venous thrombosis) (HCC)    on Coumadin  . Dyspnea   . Gastric erosions   . Gastric ulcer   . GERD (gastroesophageal reflux disease)   . Heart murmur   . History of blood transfusion ~ 03/2016   "low HgB; practically nonexistent"  . Hyperlipidemia   . Hypertension   . MI (myocardial infarction) ([place]) 1999  . Multiple myeloma ([place]) dx'd ~ 04/2016  . PAT (paroxysmal atrial tachycardia) ([place]) 12/22/2016  . Retinal hemorrhage, right eye   . Schatzki's ring   . Tachy-brady syndrome ([place])    Archie Endo 12/22/2016  . Tubular adenoma of colon     Past Surgical History:  Procedure Laterality Date  . APPENDECTOMY    . BACK SURGERY    . CATARACT EXTRACTION W/ INTRAOCULAR LENS IMPLANT Right 2014  . COLONOSCOPY WITH PROPOFOL N/A 09/04/2017   Procedure: COLONOSCOPY WITH PROPOFOL;  Surgeon: Doran Stabler, MD;  Location[facility]  ENDOSCOPY;  Service: Gastroenterology;  Laterality: N/A;  . CORONARY ANGIOGRAPHY N/A 01/23/2017   Procedure: Coronary Angiography;  Surgeon: [name], MD;  Location: [place] CV LAB;  Service: Cardiovascular;  Laterality: N/A;  . CORONARY ANGIOPLASTY WITH STENT PLACEMENT  1999  . CORONARY STENT INTERVENTION N/A 12/29/2016   Procedure: Coronary Stent Intervention;  Surgeon: Charolette Forward, MD;  Location: [place] CV LAB;  Service: Cardiovascular;  Laterality: N/A;  . CORONARY STENT INTERVENTION N/A 02/24/2017   Procedure: Coronary Stent Intervention;  Surgeon: Charolette Forward, MD;  Location: [place] CV LAB;  Service: Cardiovascular;  Laterality: N/A;  . ESOPHAGOGASTRODUODENOSCOPY N/A 11/03/2015   Procedure: ESOPHAGOGASTRODUODENOSCOPY (EGD);  Surgeon: Carol Ada, MD;  Location: [facility] ENDOSCOPY;  Service: Endoscopy;  Laterality: N/A;  . ESOPHAGOGASTRODUODENOSCOPY N/A 09/03/2017   Procedure: ESOPHAGOGASTRODUODENOSCOPY (EGD);  Surgeon: Doran Stabler, MD;  Location: [place];  Service: Gastroenterology;  Laterality: N/A;  . ESOPHAGOGASTRODUODENOSCOPY (EGD) WITH PROPOFOL N/A 11/18/2016   Procedure: ESOPHAGOGASTRODUODENOSCOPY (EGD) WITH PROPOFOL;  Surgeon: Ladene Artist, MD;  Location: [facility] ENDOSCOPY;  Service: Endoscopy;  Laterality: N/A;  . LEFT HEART CATH AND CORONARY ANGIOGRAPHY N/A 12/24/2016   Procedure: Left Heart Cath and Coronary Angiography;  Surgeon: [name], MD;  Location: [place] CV LAB;  Service: Cardiovascular;  Laterality: N/A;  . LEFT HEART CATH AND CORONARY ANGIOGRAPHY N/A 02/24/2017   Procedure: Left Heart Cath and Coronary Angiography;  Surgeon: Charolette Forward, MD;  Location: [place] CV LAB;  Service: Cardiovascular;  Laterality: N/A;  . LEFT HEART CATH AND CORONARY ANGIOGRAPHY N/A 03/05/2017   Procedure: Left Heart Cath and Coronary Angiography;  Surgeon: [name], MD;  Location: [place] CV LAB;  Service: Cardiovascular;  Laterality: N/A;  . LEFT HEART CATH AND CORONARY ANGIOGRAPHY N/A 09/02/2018   Procedure: LEFT HEART CATH AND CORONARY ANGIOGRAPHY;  Surgeon: [name], MD;  Location: [place] CV LAB;  Service: Cardiovascular;  Laterality: N/A;  . LUMBAR [place]    . RENAL ARTERY STENT  1999   bil RAS so ? bil vs unilateral stents  . RETINAL LASER PROCEDURE Right 2012   "bleeding"  . TONSILLECTOMY    . TOTAL ABDOMINAL HYSTERECTOMY      Family History  Problem Relation Age of Onset  . Breast cancer Mother   . Breast cancer Sister   . Breast cancer Maternal Aunt   . Colon cancer Neg Hx     Social  History   Socioeconomic History  . Marital status: Single    Spouse name: Not on file  . Number of children: Not on file  . Years of education: Not on file  . Highest education level: Not on file  Occupational History  . Not on file  Social Needs  . Financial resource strain: Not on file  . Food insecurity:    Worry: Not on file    Inability: Not on file  . Transportation needs:    Medical: Not on file    Non-medical: Not on file  Tobacco Use  . Smoking status: Current Every Day Smoker    Packs/day: 0.50    Years: 50.00    Pack years: 25.00    Types: Cigarettes  . Smokeless tobacco: Never Used  Substance and Sexual Activity  . Alcohol use: No    Alcohol/week: 0.0 standard drinks  . Drug use: No  . Sexual activity: Not Currently  Lifestyle  . Physical activity:    Days per week: Not on file  Minutes per session: Not on file  . Stress: Not on file  Relationships  . Social connections:    Talks on phone: Not on file    Gets together: Not on file    Attends religious service: Not on file    Active member of club or organization: Not on file    Attends meetings of clubs or organizations: Not on file    Relationship status: Not on file  . Intimate partner violence:    Fear of current or ex partner: Not on file    Emotionally abused: Not on file    Physically abused: Not on file    Forced sexual activity: Not on file  Other Topics Concern  . Not on file  Social History Narrative  . Not on file    Past Medical History, Surgical history, Social history, and Family history were reviewed and updated as appropriate.   Please see review of systems for further details on the patient's review from today.   Review of Systems:  Review of Systems  Constitutional: Positive for appetite change. Negative for chills, diaphoresis and fever.  Respiratory: Negative for cough, choking, shortness of breath and wheezing.   Cardiovascular: Negative for chest pain and palpitations.    Gastrointestinal: Positive for nausea and vomiting. Negative for constipation and diarrhea.  Genitourinary: Negative for decreased urine volume.  Neurological: Negative for headaches.    Objective:   Physical Exam:  There were no vitals taken for this visit. ECOG: 0  Physical Exam  Constitutional: No distress.  HENT:  Head: Normocephalic and atraumatic.  Mouth/Throat: Oropharynx is clear and moist.  Cardiovascular: Normal rate, regular rhythm and normal heart sounds. Exam reveals no gallop and no friction rub.  No murmur heard. Pulmonary/Chest: Effort normal and breath sounds normal. No stridor. No respiratory distress. She has no wheezes. She has no rales.  Abdominal: Soft. Bowel sounds are normal. She exhibits no distension and no mass. There is no tenderness. There is no rebound and no guarding.  Neurological: She is alert. Coordination normal.  Skin: Skin is warm and dry. She is not diaphoretic.    Lab Review:     Component Value Date/Time   NA 137 09/24/2018 1846   NA 144 09/17/2017 1429   K 3.7 09/24/2018 1846   K 3.1 (L) 09/17/2017 1429   CL 104 09/24/2018 1846   CO2 20 (L) 09/24/2018 1846   CO2 23 09/17/2017 1429   GLUCOSE 140 (H) 09/24/2018 1846   GLUCOSE 96 09/17/2017 1429   BUN 8 09/24/2018 1846   BUN 15.6 09/17/2017 1429   CREATININE 1.74 (H) 09/24/2018 1846   CREATININE 1.78 (H) 09/24/2018 1102   CREATININE 1.0 09/17/2017 1429   CALCIUM 9.2 09/24/2018 1846   CALCIUM 8.6 09/17/2017 1429   PROT 8.6 (H) 09/24/2018 1846   PROT 6.6 09/17/2017 1429   ALBUMIN 4.1 09/24/2018 1846   ALBUMIN 3.6 09/17/2017 1429   AST 23 09/24/2018 1846   AST 21 09/24/2018 1102   AST 14 09/17/2017 1429   ALT 12 09/24/2018 1846   ALT 7 09/24/2018 1102   ALT 11 09/17/2017 1429   ALKPHOS 73 09/24/2018 1846   ALKPHOS 93 09/17/2017 1429   BILITOT 0.9 09/24/2018 1846   BILITOT 0.8 09/24/2018 1102   BILITOT 1.22 (H) 09/17/2017 1429   GFRNONAA 29 (L) 09/24/2018 1846   GFRNONAA  28 (L) 09/24/2018 1102   GFRAA 34 (L) 09/24/2018 1846   GFRAA 33 (L) 09/24/2018 1102  Component Value Date/Time   WBC 7.9 09/24/2018 1846   RBC 3.73 (L) 09/24/2018 1846   HGB 10.5 (L) 09/24/2018 1846   HGB 10.4 (L) 09/24/2018 1102   HGB 11.3 (L) 09/17/2017 1428   HCT 34.6 (L) 09/24/2018 1846   HCT 35.8 09/17/2017 1428   PLT 269 09/24/2018 1846   PLT 219 09/24/2018 1102   PLT 231 09/17/2017 1428   MCV 92.8 09/24/2018 1846   MCV 93.2 09/17/2017 1428   MCH 28.2 09/24/2018 1846   MCHC 30.3 09/24/2018 1846   RDW 19.9 (H) 09/24/2018 1846   RDW 16.5 (H) 09/17/2017 1428   LYMPHSABS 0.7 09/24/2018 1102   LYMPHSABS 0.7 (L) 09/17/2017 1428   MONOABS 0.3 09/24/2018 1102   MONOABS 0.5 09/17/2017 1428   EOSABS 0.0 09/24/2018 1102   EOSABS 0.1 09/17/2017 1428   BASOSABS 0.0 09/24/2018 1102   BASOSABS 0.0 09/17/2017 1428   -------------------------------  Imaging from last 24 hours (if applicable):  Radiology interpretation: Dg Chest 2 View  Result Date: 09/15/2018 CLINICAL DATA:  Shortness of breath and chest pain EXAM: CHEST - 2 VIEW COMPARISON:  September 01, 2018 FINDINGS: There is right base atelectasis. Lungs elsewhere are clear. Heart is mildly enlarged with pulmonary vascularity normal. No adenopathy. There is aortic atherosclerosis. No evident bone lesions. IMPRESSION: Right base atelectasis. No edema or consolidation. Heart mildly enlarged. There is aortic atherosclerosis. Aortic Atherosclerosis (ICD10-I70.0). Electronically Signed   By: Lowella Grip III M.D.   On: 09/15/2018 13:19   Ct Abdomen Pelvis W Contrast  Result Date: 09/24/2018 CLINICAL DATA:  Vomiting since last night. On chemotherapy for multiple myeloma. History of appendectomy and hysterectomy. EXAM: CT ABDOMEN AND PELVIS WITH CONTRAST TECHNIQUE: Multidetector CT imaging of the abdomen and pelvis was performed using the standard protocol following bolus administration of intravenous contrast. CONTRAST:   75m OMNIPAQUE IOHEXOL 300 MG/ML  SOLN COMPARISON:  CT abdomen and pelvis August 16, 2017 FINDINGS: LOWER CHEST: RIGHT middle lobe atelectasis/scarring. Heart size is upper limits of normal, incompletely evaluated. No pericardial effusion. HEPATOBILIARY: Liver and gallbladder are normal. PANCREAS: Normal. SPLEEN: Normal. ADRENALS/URINARY TRACT: Kidneys are orthotopic, demonstrating symmetric enhancement. Focal scarring lower pole RIGHT kidney. Too small to characterize hypodensities bilateral kidneys. At least 3 homogeneously hypodense benign-appearing cyst measured to 2.2 cm. No nephrolithiasis, hydronephrosis or solid renal masses. The unopacified ureters are normal in course and caliber. Delayed imaging through the kidneys demonstrates symmetric prompt contrast excretion within the proximal urinary collecting system. Urinary bladder is decompressed and unremarkable. Normal adrenal glands. STOMACH/BOWEL: Small hiatal hernia. Mild distal stomach wall thickening and edema. The small and large bowel are normal in course and caliber without inflammatory changes, sensitivity decreased without oral contrast. Mild descending and sigmoid colonic diverticulosis. A few additional scattered colonic diverticula present. VASCULAR/LYMPHATIC: Aortoiliac vessels are normal in course and caliber. Bilateral renal artery stents. Moderate calcific atherosclerosis with potential SMA stenosis. No lymphadenopathy by CT size criteria. REPRODUCTIVE: Status post hysterectomy. OTHER: No intraperitoneal free fluid or free air. MUSCULOSKELETAL: Nonacute. Grade 1 T12-L1 and L1-2 retrolisthesis. Stable rounded lucency T12 with new central sclerosis most compatible with treated metastasis. Stable severe L5-S1 spondylosis. Old coccyx fracture. IMPRESSION: 1. Mild gastritis. 2. Colonic diverticulosis without acute diverticulitis. Aortic Atherosclerosis (ICD10-I70.0). Electronically Signed   By: CElon AlasM.D.   On: 09/24/2018 21:16   Dg  Chest Port 1 View  Result Date: 09/01/2018 CLINICAL DATA:  Short of breath chest pain, arrhythmia EXAM: PORTABLE CHEST 1 VIEW COMPARISON:  12/13/2017 FINDINGS:  Cardiac enlargement. Mild vascular congestion without edema. Mild bibasilar atelectasis. Negative for pleural effusion. IMPRESSION: Cardiac enlargement with mild vascular congestion Mild bibasilar atelectasis. Electronically Signed   By: Franchot Gallo M.D.   On: 09/01/2018 16:36        This case was discussed with Dr. Benay Spice. He expressed agreement with my management of this patient.

## 2018-09-30 ENCOUNTER — Inpatient Hospital Stay: Payer: Medicare HMO

## 2018-09-30 ENCOUNTER — Inpatient Hospital Stay (HOSPITAL_BASED_OUTPATIENT_CLINIC_OR_DEPARTMENT_OTHER): Payer: Medicare HMO | Admitting: Oncology

## 2018-09-30 ENCOUNTER — Telehealth: Payer: Self-pay | Admitting: Oncology

## 2018-09-30 ENCOUNTER — Telehealth: Payer: Self-pay | Admitting: Gastroenterology

## 2018-09-30 VITALS — BP 155/99 | HR 93 | Temp 97.8°F | Resp 20 | Ht 65.0 in | Wt 156.0 lb

## 2018-09-30 DIAGNOSIS — Z86718 Personal history of other venous thrombosis and embolism: Secondary | ICD-10-CM | POA: Diagnosis not present

## 2018-09-30 DIAGNOSIS — F17210 Nicotine dependence, cigarettes, uncomplicated: Secondary | ICD-10-CM | POA: Diagnosis not present

## 2018-09-30 DIAGNOSIS — D630 Anemia in neoplastic disease: Secondary | ICD-10-CM | POA: Diagnosis not present

## 2018-09-30 DIAGNOSIS — C9002 Multiple myeloma in relapse: Secondary | ICD-10-CM

## 2018-09-30 DIAGNOSIS — N183 Chronic kidney disease, stage 3 (moderate): Secondary | ICD-10-CM | POA: Diagnosis not present

## 2018-09-30 DIAGNOSIS — I4891 Unspecified atrial fibrillation: Secondary | ICD-10-CM

## 2018-09-30 DIAGNOSIS — C9000 Multiple myeloma not having achieved remission: Secondary | ICD-10-CM

## 2018-09-30 DIAGNOSIS — I129 Hypertensive chronic kidney disease with stage 1 through stage 4 chronic kidney disease, or unspecified chronic kidney disease: Secondary | ICD-10-CM | POA: Diagnosis not present

## 2018-09-30 DIAGNOSIS — Z7901 Long term (current) use of anticoagulants: Secondary | ICD-10-CM | POA: Diagnosis not present

## 2018-09-30 DIAGNOSIS — Z79899 Other long term (current) drug therapy: Secondary | ICD-10-CM | POA: Diagnosis not present

## 2018-09-30 DIAGNOSIS — I2510 Atherosclerotic heart disease of native coronary artery without angina pectoris: Secondary | ICD-10-CM | POA: Diagnosis not present

## 2018-09-30 DIAGNOSIS — R112 Nausea with vomiting, unspecified: Secondary | ICD-10-CM | POA: Diagnosis not present

## 2018-09-30 DIAGNOSIS — Z5112 Encounter for antineoplastic immunotherapy: Secondary | ICD-10-CM | POA: Diagnosis present

## 2018-09-30 LAB — CMP (CANCER CENTER ONLY)
ALT: 6 U/L (ref 0–44)
AST: 17 U/L (ref 15–41)
Albumin: 3.8 g/dL (ref 3.5–5.0)
Alkaline Phosphatase: 78 U/L (ref 38–126)
Anion gap: 13 (ref 5–15)
BUN: 10 mg/dL (ref 8–23)
CO2: 19 mmol/L — ABNORMAL LOW (ref 22–32)
Calcium: 9.7 mg/dL (ref 8.9–10.3)
Chloride: 107 mmol/L (ref 98–111)
Creatinine: 1.41 mg/dL — ABNORMAL HIGH (ref 0.44–1.00)
GFR, Est AFR Am: 43 mL/min — ABNORMAL LOW (ref >60)
GFR, Estimated: 37 mL/min — ABNORMAL LOW (ref >60)
Glucose, Bld: 102 mg/dL — ABNORMAL HIGH (ref 70–99)
Potassium: 3.5 mmol/L (ref 3.5–5.1)
Sodium: 139 mmol/L (ref 135–145)
TOTAL PROTEIN: 8.3 g/dL — AB (ref 6.5–8.1)
Total Bilirubin: 1 mg/dL (ref 0.3–1.2)

## 2018-09-30 LAB — CBC WITH DIFFERENTIAL (CANCER CENTER ONLY)
Abs Immature Granulocytes: 0.03 10*3/uL (ref 0.00–0.07)
Basophils Absolute: 0 10*3/uL (ref 0.0–0.1)
Basophils Relative: 1 %
Eosinophils Absolute: 0 10*3/uL (ref 0.0–0.5)
Eosinophils Relative: 1 %
HEMATOCRIT: 33 % — AB (ref 36.0–46.0)
Hemoglobin: 10.2 g/dL — ABNORMAL LOW (ref 12.0–15.0)
Immature Granulocytes: 1 %
LYMPHS ABS: 0.5 10*3/uL — AB (ref 0.7–4.0)
Lymphocytes Relative: 8 %
MCH: 28.4 pg (ref 26.0–34.0)
MCHC: 30.9 g/dL (ref 30.0–36.0)
MCV: 91.9 fL (ref 80.0–100.0)
Monocytes Absolute: 0.3 10*3/uL (ref 0.1–1.0)
Monocytes Relative: 5 %
NEUTROS ABS: 5 10*3/uL (ref 1.7–7.7)
Neutrophils Relative %: 84 %
Platelet Count: 169 10*3/uL (ref 150–400)
RBC: 3.59 MIL/uL — ABNORMAL LOW (ref 3.87–5.11)
RDW: 18.8 % — AB (ref 11.5–15.5)
WBC Count: 5.8 10*3/uL (ref 4.0–10.5)
nRBC: 0 % (ref 0.0–0.2)

## 2018-09-30 MED ORDER — TRAMADOL HCL 50 MG PO TABS: 50.0000 mg | ORAL_TABLET | Freq: Four times a day (QID) | ORAL | 0 refills | Status: DC | PRN

## 2018-09-30 NOTE — Telephone Encounter (Signed)
Scheduled appt per 12/12 los - pt is aware of apt date and time

## 2018-09-30 NOTE — Progress Notes (Signed)
Abigail [name]   Diagnosis: Multiple myeloma  INTERVAL HISTORY:   Abigail [name] turns as scheduled.  She began another cycle of Cytoxan/Velcade/Decadron on 09/24/2018.  She had nausea and vomiting on the day of treatment.  The nausea predated treatment on 09/24/2018.  She received intravenous fluids and Zofran.  She returned to the emergency room on the evening of 09/24/2018 with vomiting.  A CT of the abdomen pelvis revealed mild distal stomach wall thickening and edema and diverticulosis.  No evidence of diverticulitis.  The pancreas and spleen appeared normal. She takes the Cytoxan and Decadron parts of the chemotherapy regimen at home. She complains of constant abdominal pain for the past week.  The pain is partially relieved with tramadol.  No nausea at present.  No dysuria. She was admitted De Nurse with rapid atrial fibrillation.  She was discharged home on 09/17/2018. Objective:  Vital signs in last 24 hours:  Blood pressure (!) 155/99, pulse 93, temperature 97.8 F (36.6 C), temperature source Oral, resp. rate 20, height '5\' 5"'  (1.651 m), weight 156 lb (70.8 kg), SpO2 98 %.    HEENT: No thrush or ulcers Resp: Lungs clear bilaterally Cardio: Regular rate and rhythm GI: No hepatosplenomegaly, soft, tender in the left mid abdomen.  No mass. Vascular: No leg edema   Lab Results:  Lab Results  Component Value Date   WBC 5.8 09/30/2018   HGB 10.2 (L) 09/30/2018   HCT 33.0 (L) 09/30/2018   MCV 91.9 09/30/2018   PLT 169 09/30/2018   NEUTROABS 5.0 09/30/2018    CMP  Lab Results  Component Value Date   NA 139 09/30/2018   K 3.5 09/30/2018   CL 107 09/30/2018   CO2 19 (L) 09/30/2018   GLUCOSE 102 (H) 09/30/2018   BUN 10 09/30/2018   CREATININE 1.41 (H) 09/30/2018   CALCIUM 9.7 09/30/2018   PROT 8.3 (H) 09/30/2018   ALBUMIN 3.8 09/30/2018   AST 17 09/30/2018   ALT 6 09/30/2018   ALKPHOS 78 09/30/2018   BILITOT 1.0 09/30/2018   GFRNONAA  37 (L) 09/30/2018   GFRAA 43 (L) 09/30/2018    Medications: I have reviewed the patient's current medications.   Assessment/Plan: 1. Multiple myeloma, IgG lambda, bone marrow biopsy 06/15/2016 confirmed multiple myeloma; cytogenetics by FISHshow +11, +12, 13q-  Elevated serum free lambda light chains  Lambda light chain proteinuria  Lytic bone lesions on a bone survey 06/13/2016  Initiation of weekly Velcade/Decadron 06/19/2016  Serum light chains improved 07/31/2016  Initiation of Revlimid 11/03/20172 weeks on/2 weeks off  Serum M spike and IgG significantly improved 09/25/2016  Velcade held on 10/30/2016 due to urinary retention  Treatment resumed with Revlimid (2 weeks on/2 weeks off) and weekly Decadron on 02/12/2017, treatment placed on hold May 2018 secondary to admissions with cardiac disease  05/27/2018-increased serum M spike, IgG, serum lambda light chains  Bone survey 05/31/2018-widespread myeloma without detectable progression since 06/13/2016.  Cycle 1 Cytoxan/Velcade/Decadron 06/24/2018  Cycle 2 Cytoxan/Velcade/Decadron 07/22/2018  08/09/2018 serum light chains, M spike, IgGimproved  Cycle for Cytoxan/Velcade/Decadron 09/24/2018 2. Severe anemia secondary to #1-markedly improved  3. Diffuse lytic bone lesions secondary to multiple myeloma, status post Zometa 09/18/2016(plan to continue every 3 months);most recent Zometa infusion 10/08/2017  4. History of coronary artery disease/myocardial infarction  5. History of colon polyps  6. Bilateral leg edema 09/04/2016. Negative bilateral venous Doppler 09/05/2016.  7. Mild periorbital edema 09/18/2016, question related to early stye formation, question Velcade  related chalazia.Improved.  8. CT scan 10/27/2016 with a new right kidney massevaluated by urology; MRI abdomen 11/18/2016 multiple bilateral renal cysts. Most of these are simple cysts. The 2 lesions in question on the CT scan are both  hemorrhagic or proteinaceous cysts. No worrisome contrast enhancement.  9. Urinary retention 10/28/2016 status post evaluation in the emergency department. Urinalysis negative for signs of infection 10/30/2016. Velcade held. Resolved.  10. Abdominal pain, nausea, vomiting.Etiology unclear. Upper endoscopy 11/18/2016 with findings of reflux esophagitis, benign appearing esophageal stenosis, small hiatal hernia and erythematous duodenopathy. CT abdomen/pelvis 11/19/2016 with no acute abnormality.  11. 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 Left lower extremity DVT May 2018.Maintainedon Coumadin.Coumadin discontinued October 2018 secondary to a supratherapeutic PT/INR and GI bleeding  13.Admission with GI bleeding October and November 2018-felt secondary to diverticular bleeding, initially while on supratherapeutic Coumadin, colonoscopy 09/04/2017-no source of blood loss identified, superficial tear at the posterior anal mucosa without stigmata of bleeding  14. Acute creatinine elevation 3.26 on 08/05/2018 of cycle 2 day 15; confirmed on repeat 3.13 on 08/06/18; improved    Disposition: Abigail [name] has multiple myeloma.  She is currently being treated with a salvage systemic therapy regimen consisting of Cytoxan, Velcade, and Decadron.  The most recent myeloma markers are improved.  She has been admitted twice over the past month with cardiac issues.  She now has abdominal pain of unclear etiology.  The CT of the abdomen/pelvis last week revealed no clear explanation for the pain.  She will continue tramadol as needed.  I will make a referral to Dr. Loletha Carrow.  I cannot relate the pain to the history of myeloma or her current systemic therapy regimen.  Gastric wall thickening was noted on the CT 09/24/2018.  She is taking twice daily Protonix.  We held chemotherapy today.  She will return for an office visit and further evaluation next week.  We will repeat a  myeloma panel when she returns next week.  25 minutes were spent with the patient today.  The majority of the time was used for counseling and coordination of care.  [name], MD  09/30/2018  11:06 AM

## 2018-09-30 NOTE — Telephone Encounter (Signed)
Dr Learta Codding of oncology is asking for evaluation for abdominal pain.  Patient on CTX for myeloma.  Has seen me in past.  Please get in with me or APP next week as available. - HD

## 2018-10-01 NOTE — Telephone Encounter (Signed)
  My advice is that she come to the next available appointment that fits her schedule.

## 2018-10-01 NOTE — Telephone Encounter (Signed)
Pt has scheduled for 10-26-2017 @ 830am

## 2018-10-01 NOTE — Telephone Encounter (Addendum)
Called and spoke to patient, offered her the 12-17 @ 4pm and the 12-19 at 945 am. She declines both appointments. She only wants a morning appointment, however the 12-19 at 945 am she will be in chemo. Please advise.

## 2018-10-02 ENCOUNTER — Other Ambulatory Visit: Payer: Self-pay

## 2018-10-02 ENCOUNTER — Encounter (HOSPITAL_COMMUNITY): Payer: Self-pay

## 2018-10-02 ENCOUNTER — Emergency Department (HOSPITAL_COMMUNITY): Payer: Medicare HMO

## 2018-10-02 ENCOUNTER — Observation Stay (HOSPITAL_COMMUNITY)
Admission: EM | Admit: 2018-10-02 | Discharge: 2018-10-03 | Disposition: A | Discharge status: Home / Self Care | Payer: Medicare HMO | Attending: Cardiology | Admitting: Cardiology

## 2018-10-02 DIAGNOSIS — I4891 Unspecified atrial fibrillation: Secondary | ICD-10-CM | POA: Diagnosis not present

## 2018-10-02 DIAGNOSIS — N183 Chronic kidney disease, stage 3 (moderate): Secondary | ICD-10-CM | POA: Insufficient documentation

## 2018-10-02 DIAGNOSIS — I2510 Atherosclerotic heart disease of native coronary artery without angina pectoris: Secondary | ICD-10-CM | POA: Diagnosis not present

## 2018-10-02 DIAGNOSIS — R55 Syncope and collapse: Secondary | ICD-10-CM | POA: Diagnosis present

## 2018-10-02 DIAGNOSIS — R778 Other specified abnormalities of plasma proteins: Secondary | ICD-10-CM

## 2018-10-02 DIAGNOSIS — F17210 Nicotine dependence, cigarettes, uncomplicated: Secondary | ICD-10-CM | POA: Diagnosis not present

## 2018-10-02 DIAGNOSIS — I129 Hypertensive chronic kidney disease with stage 1 through stage 4 chronic kidney disease, or unspecified chronic kidney disease: Secondary | ICD-10-CM | POA: Insufficient documentation

## 2018-10-02 DIAGNOSIS — E785 Hyperlipidemia, unspecified: Secondary | ICD-10-CM | POA: Diagnosis not present

## 2018-10-02 DIAGNOSIS — Z79899 Other long term (current) drug therapy: Secondary | ICD-10-CM | POA: Diagnosis not present

## 2018-10-02 DIAGNOSIS — I739 Peripheral vascular disease, unspecified: Secondary | ICD-10-CM | POA: Insufficient documentation

## 2018-10-02 DIAGNOSIS — C9001 Multiple myeloma in remission: Secondary | ICD-10-CM | POA: Diagnosis not present

## 2018-10-02 DIAGNOSIS — D631 Anemia in chronic kidney disease: Secondary | ICD-10-CM | POA: Insufficient documentation

## 2018-10-02 DIAGNOSIS — Z7901 Long term (current) use of anticoagulants: Secondary | ICD-10-CM | POA: Diagnosis not present

## 2018-10-02 DIAGNOSIS — R7989 Other specified abnormal findings of blood chemistry: Secondary | ICD-10-CM | POA: Insufficient documentation

## 2018-10-02 LAB — BASIC METABOLIC PANEL
Anion gap: 12 (ref 5–15)
BUN: 17 mg/dL (ref 8–23)
CO2: 19 mmol/L — ABNORMAL LOW (ref 22–32)
CREATININE: 1.7 mg/dL — AB (ref 0.44–1.00)
Calcium: 9.3 mg/dL (ref 8.9–10.3)
Chloride: 108 mmol/L (ref 98–111)
GFR calc non Af Amer: 30 mL/min — ABNORMAL LOW (ref >60)
GFR, EST AFRICAN AMERICAN: 35 mL/min — AB (ref >60)
Glucose, Bld: 108 mg/dL — ABNORMAL HIGH (ref 70–99)
Potassium: 3.7 mmol/L (ref 3.5–5.1)
SODIUM: 139 mmol/L (ref 135–145)

## 2018-10-02 LAB — CBC WITH DIFFERENTIAL/PLATELET
Abs Immature Granulocytes: 0.03 10*3/uL (ref 0.00–0.07)
BASOS ABS: 0 10*3/uL (ref 0.0–0.1)
Basophils Relative: 1 %
Eosinophils Absolute: 0 10*3/uL (ref 0.0–0.5)
Eosinophils Relative: 1 %
HCT: 32.8 % — ABNORMAL LOW (ref 36.0–46.0)
Hemoglobin: 9.7 g/dL — ABNORMAL LOW (ref 12.0–15.0)
Immature Granulocytes: 1 %
Lymphocytes Relative: 13 %
Lymphs Abs: 0.6 10*3/uL — ABNORMAL LOW (ref 0.7–4.0)
MCH: 28 pg (ref 26.0–34.0)
MCHC: 29.6 g/dL — AB (ref 30.0–36.0)
MCV: 94.8 fL (ref 80.0–100.0)
Monocytes Absolute: 0.6 10*3/uL (ref 0.1–1.0)
Monocytes Relative: 14 %
NRBC: 0 % (ref 0.0–0.2)
Neutro Abs: 3.1 10*3/uL (ref 1.7–7.7)
Neutrophils Relative %: 70 %
Platelets: 199 10*3/uL (ref 150–400)
RBC: 3.46 MIL/uL — ABNORMAL LOW (ref 3.87–5.11)
RDW: 19.3 % — ABNORMAL HIGH (ref 11.5–15.5)
WBC: 4.3 10*3/uL (ref 4.0–10.5)

## 2018-10-02 LAB — TSH: TSH: 0.751 u[IU]/mL (ref 0.350–4.500)

## 2018-10-02 LAB — MAGNESIUM: MAGNESIUM: 1.4 mg/dL — AB (ref 1.7–2.4)

## 2018-10-02 LAB — TROPONIN I
Troponin I: 0.39 ng/mL (ref <0.03)
Troponin I: 0.35 ng/mL (ref <0.03)

## 2018-10-02 MED ORDER — ASPIRIN EC 81 MG PO TBEC
81.0000 mg | DELAYED_RELEASE_TABLET | Freq: Every day | ORAL | Status: DC
  Administered 2018-10-03: 81 mg via ORAL
  Filled 2018-10-02: qty 1

## 2018-10-02 MED ORDER — AMIODARONE HCL IN DEXTROSE 360-4.14 MG/200ML-% IV SOLN: 30.0000 mg/h | INTRAVENOUS | Status: DC

## 2018-10-02 MED ORDER — AMIODARONE LOAD VIA INFUSION
150.0000 mg | Freq: Once | INTRAVENOUS | Status: AC
  Administered 2018-10-02: 150 mg via INTRAVENOUS

## 2018-10-02 MED ORDER — METOPROLOL TARTRATE 12.5 MG HALF TABLET
12.5000 mg | ORAL_TABLET | Freq: Two times a day (BID) | ORAL | Status: DC
  Administered 2018-10-02: 12.5 mg via ORAL
  Filled 2018-10-02: qty 1

## 2018-10-02 MED ORDER — ONDANSETRON HCL 4 MG/2ML IJ SOLN: 4.0000 mg | Freq: Four times a day (QID) | INTRAMUSCULAR | Status: DC | PRN

## 2018-10-02 MED ORDER — APIXABAN 2.5 MG PO TABS
2.5000 mg | ORAL_TABLET | Freq: Two times a day (BID) | ORAL | Status: DC
  Administered 2018-10-02 – 2018-10-03 (×2): 2.5 mg via ORAL
  Filled 2018-10-02 (×2): qty 1

## 2018-10-02 MED ORDER — SODIUM CHLORIDE 0.9 % IV BOLUS
1000.0000 mL | Freq: Once | INTRAVENOUS | Status: AC
  Administered 2018-10-02: 1000 mL via INTRAVENOUS

## 2018-10-02 MED ORDER — FERROUS SULFATE 325 (65 FE) MG PO TABS
325.0000 mg | ORAL_TABLET | Freq: Every day | ORAL | Status: DC
  Administered 2018-10-03: 325 mg via ORAL
  Filled 2018-10-02: qty 1

## 2018-10-02 MED ORDER — PANTOPRAZOLE SODIUM 40 MG PO TBEC
40.0000 mg | DELAYED_RELEASE_TABLET | Freq: Two times a day (BID) | ORAL | Status: DC
  Administered 2018-10-02 – 2018-10-03 (×2): 40 mg via ORAL
  Filled 2018-10-02 (×2): qty 1

## 2018-10-02 MED ORDER — CALCIUM CARBONATE 1250 (500 CA) MG PO TABS
1.0000 | ORAL_TABLET | Freq: Every day | ORAL | Status: DC
  Administered 2018-10-03: 500 mg via ORAL
  Filled 2018-10-02: qty 1

## 2018-10-02 MED ORDER — SODIUM CHLORIDE 0.9 % IV BOLUS
250.0000 mL | Freq: Once | INTRAVENOUS | Status: AC
  Administered 2018-10-02: 250 mL via INTRAVENOUS

## 2018-10-02 MED ORDER — ENSURE ENLIVE PO LIQD: 237.0000 mL | Freq: Two times a day (BID) | ORAL | Status: DC

## 2018-10-02 MED ORDER — SODIUM CHLORIDE 0.9 % IV SOLN: INTRAVENOUS | Status: DC

## 2018-10-02 MED ORDER — DILTIAZEM HCL-DEXTROSE 100-5 MG/100ML-% IV SOLN (PREMIX)
5.0000 mg/h | INTRAVENOUS | Status: DC
  Administered 2018-10-02: 5 mg/h via INTRAVENOUS
  Filled 2018-10-02: qty 100

## 2018-10-02 MED ORDER — ASPIRIN 81 MG PO CHEW
324.0000 mg | CHEWABLE_TABLET | ORAL | Status: AC
  Administered 2018-10-02: 324 mg via ORAL
  Filled 2018-10-02: qty 4

## 2018-10-02 MED ORDER — NITROGLYCERIN 0.4 MG SL SUBL: 0.4000 mg | SUBLINGUAL_TABLET | SUBLINGUAL | Status: DC | PRN

## 2018-10-02 MED ORDER — ACYCLOVIR 400 MG PO TABS
400.0000 mg | ORAL_TABLET | Freq: Two times a day (BID) | ORAL | Status: DC
  Administered 2018-10-02 – 2018-10-03 (×2): 400 mg via ORAL
  Filled 2018-10-02 (×2): qty 1

## 2018-10-02 MED ORDER — ACETAMINOPHEN 325 MG PO TABS
650.0000 mg | ORAL_TABLET | Freq: Four times a day (QID) | ORAL | Status: DC | PRN
  Administered 2018-10-02: 650 mg via ORAL
  Filled 2018-10-02 (×2): qty 2

## 2018-10-02 MED ORDER — ASPIRIN 300 MG RE SUPP: 300.0000 mg | RECTAL | Status: AC

## 2018-10-02 MED ORDER — AMIODARONE HCL IN DEXTROSE 360-4.14 MG/200ML-% IV SOLN
60.0000 mg/h | INTRAVENOUS | Status: AC
  Administered 2018-10-02 (×2): 60 mg/h via INTRAVENOUS
  Filled 2018-10-02 (×2): qty 200

## 2018-10-02 MED ORDER — ATORVASTATIN CALCIUM 40 MG PO TABS
40.0000 mg | ORAL_TABLET | Freq: Every day | ORAL | Status: DC
  Administered 2018-10-02: 40 mg via ORAL
  Filled 2018-10-02: qty 1

## 2018-10-02 MED ORDER — METOPROLOL TARTRATE 5 MG/5ML IV SOLN
2.5000 mg | Freq: Once | INTRAVENOUS | Status: AC
  Administered 2018-10-02: 2.5 mg via INTRAVENOUS
  Filled 2018-10-02: qty 5

## 2018-10-02 MED ORDER — DILTIAZEM LOAD VIA INFUSION
10.0000 mg | Freq: Once | INTRAVENOUS | Status: AC
  Administered 2018-10-02: 10 mg via INTRAVENOUS
  Filled 2018-10-02: qty 10

## 2018-10-02 NOTE — ED Provider Notes (Signed)
[facility] EMERGENCY DEPARTMENT Provider Note   CSN: [id number] Arrival date & time: 10/02/18  1112     History   Chief Complaint Chief Complaint  Patient presents with  . Loss of Consciousness    HPI Abigail [name] is a 72 y.o. female.  HPI  The patient is a 72 year old female, she is well known to me for several visits in the emergency department over the last couple of months, unfortunately she has a history of multiple myeloma and is actively undergoing chemotherapy.  She also has a history of recurrent atrial fibrillation and has struggled with persistent atrial fibrillation with rapid ventricular rate which seems to recur as well.  She has been admitted to the hospital several times in the past several months for similar reasons.  She is currently taking Eliquis.  She reports yesterday while she was resting she developed acute onset of symptoms including palpitations and feeling generally weak.  This weakness comes in waves but today was much worse and associated with severe tachycardia on her arrival.  She denies any fevers chills nausea vomiting or diarrhea but has been lightheaded and feeling like she is going to pass out.  She reports that she actually did have a syncopal episode yesterday, she is unsure how long she was unresponsive on the ground.  She denies headache or head injury.  Past Medical History:  Diagnosis Date  . ACS (acute coronary syndrome) ([place]) 06/2017  . Anemia    "off and on all my life" (12/22/2016)  . Anxiety   . Bilateral renal artery stenosis ([place]) 1999   s/p stenting  . Chronic kidney disease (CKD), stage III (moderate) ([place])    Archie Endo 12/22/2016  . Coronary artery disease 1999  . Depression   . Diverticulosis   . Duodenitis   . DVT (deep venous thrombosis) ([facility])    on Coumadin  . Dyspnea   . Gastric erosions   . Gastric ulcer   . GERD (gastroesophageal reflux disease)   . Heart murmur   . History of blood transfusion ~ 03/2016     "low HgB; practically nonexistent"  . Hyperlipidemia   . Hypertension   . MI (myocardial infarction) ([place]) 1999  . Multiple myeloma ([place]) dx'd ~ 04/2016  . PAT (paroxysmal atrial tachycardia) ([place]) 12/22/2016  . Retinal hemorrhage, right eye   . Schatzki's ring   . Tachy-brady syndrome ([place])    Archie Endo 12/22/2016  . Tubular adenoma of colon     Patient Active Problem List   Diagnosis Date Noted  . Precordial chest pain 09/15/2018  . Multiple myeloma in relapse ([name]) 06/15/2018  . Goals of care, counseling/discussion 06/15/2018  . Hematochezia   . Acute blood loss anemia   . Diverticulosis of colon with hemorrhage   . GIB (gastrointestinal bleeding) 09/03/2017  . Symptomatic anemia 09/03/2017  . Acute systolic heart failure ([name]) 09/03/2017  . Chronic kidney disease (CKD), stage III (moderate) ([name]) 09/03/2017  . Hyperlipidemia 09/03/2017  . Hypertension 09/03/2017  . Multiple myeloma in remission ([name]) 09/03/2017  . GI bleed 08/17/2017  . Sepsis ([name]) 08/17/2017  . Fever 08/16/2017  . STEMI (ST elevation myocardial infarction) ([name]) 02/24/2017  . Acute anterolateral wall MI ([name]) 02/24/2017  . Dehydration 01/21/2017  . Acute coronary syndrome ([name]) 12/24/2016  . Dizziness 12/22/2016    Class: Acute  . Orthostatic hypotension 12/08/2016  . Syncope and collapse 12/06/2016  . Tachycardia-bradycardia ([place])   . Essential hypertension   . Nausea and  vomiting in adult patient   . Erosive esophagitis   . Protein-calorie malnutrition, severe 11/19/2016  . LLQ pain   . LUQ abdominal pain   . Intractable nausea and vomiting 11/17/2016  . Abdominal pain 11/05/2016  . Multiple myeloma ([name]) 06/18/2016  . Malnutrition of moderate degree 06/14/2016  . 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 Exertional dyspnea 11/01/2015  . Coronary atherosclerosis of native coronary artery 11/01/2015  . Shortness of breath 11/01/2015  . Chest pain 12/31/2011  .  Coronary artery disease 10/20/1997    Past Surgical History:  Procedure Laterality Date  . APPENDECTOMY    . BACK SURGERY    . CATARACT EXTRACTION W/ INTRAOCULAR LENS IMPLANT Right 2014  . COLONOSCOPY WITH PROPOFOL N/A 09/04/2017   Procedure: COLONOSCOPY WITH PROPOFOL;  Surgeon: Doran Stabler, MD;  Location: [facility];  Service: Gastroenterology;  Laterality: N/A;  . CORONARY ANGIOGRAPHY N/A 01/23/2017   Procedure: Coronary Angiography;  Surgeon: [name], MD;  Location: [facility];  Service: Cardiovascular;  Laterality: N/A;  . CORONARY ANGIOPLASTY WITH STENT PLACEMENT  1999  . CORONARY STENT INTERVENTION N/A 12/29/2016   Procedure: Coronary Stent Intervention;  Surgeon: Charolette Forward, MD;  Location: [facility];  Service: Cardiovascular;  Laterality: N/A;  . CORONARY STENT INTERVENTION N/A 02/24/2017   Procedure: Coronary Stent Intervention;  Surgeon: Charolette Forward, MD;  Location: [facility];  Service: Cardiovascular;  Laterality: N/A;  . ESOPHAGOGASTRODUODENOSCOPY N/A 11/03/2015   Procedure: ESOPHAGOGASTRODUODENOSCOPY (EGD);  Surgeon: Carol Ada, MD;  [facility];  Service: Endoscopy;  Laterality: N/A;  . ESOPHAGOGASTRODUODENOSCOPY N/A 09/03/2017   Procedure: ESOPHAGOGASTRODUODENOSCOPY (EGD);  Surgeon: Doran Stabler, MD;  Location: [facility];  Service: Gastroenterology;  Laterality: N/A;  . ESOPHAGOGASTRODUODENOSCOPY (EGD) WITH PROPOFOL N/A 11/18/2016   Procedure: ESOPHAGOGASTRODUODENOSCOPY (EGD) WITH PROPOFOL;  Surgeon: Ladene Artist, MD;  Location: [facility]OS[facility];  Service: Endoscopy;  Laterality: N/A;  . LEFT HEART CATH AND CORONARY ANGIOGRAPHY N/A 12/24/2016   Procedure: Left Heart Cath and Coronary Angiography;  Surgeon: [name], MD;  Location: [facility];  Service: Cardiovascular;  Laterality: N/A;  . LEFT HEART CATH AND CORONARY ANGIOGRAPHY N/A 02/24/2017   Procedure: Left Heart Cath and Coronary Angiography;  Surgeon:  Charolette Forward, MD;  Location: [facility];  Service: Cardiovascular;  Laterality: N/A;  . LEFT HEART CATH AND CORONARY ANGIOGRAPHY N/A 03/05/2017   Procedure: Left Heart Cath and Coronary Angiography;  Surgeon: [name], MD;  Location: [facility];  Service: Cardiovascular;  Laterality: N/A;  . LEFT HEART CATH AND CORONARY ANGIOGRAPHY N/A 09/02/2018   Procedure: LEFT HEART CATH AND CORONARY ANGIOGRAPHY;  Surgeon: [name], MD;  Location: [facility];  Service: Cardiovascular;  Laterality: N/A;  . LUMBAR [place]    . RENAL ARTERY STENT  1999   bil RAS so ? bil vs unilateral stents  . RETINAL LASER PROCEDURE Right 2012   "bleeding"  . TONSILLECTOMY    . TOTAL ABDOMINAL HYSTERECTOMY       OB History   No obstetric history on file.      Home Medications    Prior to Admission medications   Medication Sig Start Date End Date Taking? Authorizing Provider  acyclovir (ZOVIRAX) 400 MG tablet TAKE 1 TABLET (400 MG TOTAL) BY MOUTH 2 (TWO) TIMES DAILY. 08/29/16   Ladell Pier, MD  apixaban (ELIQUIS) 2.5 MG TABS tablet Take 1 tablet (  2.5 mg total) by mouth 2 (two) times daily. 09/17/18   [name], MD  atorvastatin (LIPITOR) 80 MG tablet Take 1 tablet (80 mg total) by mouth daily at 6 PM. 03/01/17   [name], MD  calcium carbonate (OS-CAL - DOSED IN MG OF ELEMENTAL CALCIUM) 1250 (500 Ca) MG tablet Take 1 tablet by mouth daily with breakfast.    [provider]  diltiazem (CARDIZEM) 30 MG tablet Take 1 tablet (30 mg total) by mouth every 12 (twelve) hours. Patient not taking: Reported on 09/30/2018 09/17/18   [name], MD  feeding supplement, ENSURE ENLIVE, (ENSURE ENLIVE) LIQD Take 237 mLs by mouth 2 (two) times daily between meals. Patient taking differently: Take 237 mLs by mouth See admin instructions. Drink one bottle (237 ml) by mouth one or two times daily as a meal supplement 09/17/18   [name], MD  ferrous sulfate 325 (65  FE) MG tablet Take 1 tablet (325 mg total) by mouth daily with breakfast. 03/02/17   [name], MD  isosorbide mononitrate (IMDUR) 30 MG 24 hr tablet Take 1 tablet (30 mg total) by mouth daily. 09/04/18   [name], MD  nitroGLYCERIN (NITROSTAT) 0.4 MG SL tablet Place 0.4 mg under the tongue every 5 (five) minutes as needed for chest pain. 03/20/17   [provider]  ondansetron (ZOFRAN ODT) 8 MG disintegrating tablet Take 1 tablet (8 mg total) by mouth every 8 (eight) hours as needed for nausea or vomiting. 09/24/18   Jola Schmidt, MD  ondansetron (ZOFRAN) 8 MG tablet Take 1 tablet (8 mg total) by mouth 2 (two) times daily as needed for nausea or vomiting. Patient not taking: Reported on 09/30/2018 09/24/18   Ladell Pier, MD  pantoprazole (PROTONIX) 40 MG tablet Take 1 tablet (40 mg total) by mouth 2 (two) times daily before a meal. 09/17/18   [name], MD  potassium chloride SA (K-DUR,KLOR-CON) 20 MEQ tablet Take 1 tablet (20 mEq total) by mouth 2 (two) times daily. Patient taking differently: Take 20 mEq by mouth daily.  09/17/18   [name], MD  prochlorperazine (COMPAZINE) 5 MG tablet Take 1 tablet (5 mg total) by mouth every 6 (six) hours as needed for nausea or vomiting. Patient not taking: Reported on 09/30/2018 09/24/18   Ladell Pier, MD  pyridostigmine (MESTINON) 60 MG tablet Take 0.5 tablets (30 mg total) by mouth 2 (two) times daily. 01/25/17   [name], MD  tiZANidine (ZANAFLEX) 4 MG tablet Take 4 mg by mouth at bedtime as needed for muscle spasms.  04/06/18   [provider]  traMADol (ULTRAM) 50 MG tablet Take 1 tablet (50 mg total) by mouth every 6 (six) hours as needed for severe pain. 09/30/18   Ladell Pier, MD  ISOSORBIDE DINITRATE PO Take by mouth.  12/30/11  [provider]    Family History Family History  Problem Relation Age of Onset  . Breast cancer Mother   . Breast cancer Sister   . Breast cancer Maternal Aunt     . Colon cancer Neg Hx     Social History Social History   Tobacco Use  . Smoking status: Current Every Day Smoker    Packs/day: 0.50    Years: 50.00    Pack years: 25.00    Types: Cigarettes  . Smokeless tobacco: Never Used  Substance Use Topics  . Alcohol use: No    Alcohol/week: 0.0 standard drinks  . Drug use: No  Allergies   Sulfa antibiotics   Review of Systems Review of Systems  All other systems reviewed and are negative.    Physical Exam Updated Vital Signs BP (!) 132/104 (BP Location: Right Arm)   Pulse (!) 135   Temp (!) 97.1 F (36.2 C) (Oral)   Resp 19   Ht 1.651 m (5' 5")   Wt 70.8 kg   SpO2 97%   BMI 25.96 kg/m   Physical Exam Vitals signs and nursing note reviewed.  Constitutional:      General: She is not in acute distress.    Appearance: She is well-developed. She is ill-appearing.  HENT:     Head: Normocephalic and atraumatic.     Mouth/Throat:     Pharynx: No oropharyngeal exudate.  Eyes:     General: No scleral icterus.       Right eye: No discharge.        Left eye: No discharge.     Conjunctiva/sclera: Conjunctivae normal.     Pupils: Pupils are equal, round, and reactive to light.  Neck:     Musculoskeletal: Normal range of motion and neck supple.     Thyroid: No thyromegaly.     Vascular: No JVD.  Cardiovascular:     Rate and Rhythm: Tachycardia present. Rhythm irregular.     Heart sounds: Normal heart sounds. No murmur. No friction rub. No gallop.   Pulmonary:     Effort: Pulmonary effort is normal. No respiratory distress.     Breath sounds: Normal breath sounds. No wheezing or rales.  Abdominal:     General: Bowel sounds are normal. There is no distension.     Palpations: Abdomen is soft. There is no mass.     Tenderness: There is no abdominal tenderness.  Musculoskeletal: Normal range of motion.        General: No tenderness.  Lymphadenopathy:     Cervical: No cervical adenopathy.  Skin:    General: Skin is  warm and dry.     Findings: No erythema or rash.  Neurological:     Mental Status: She is alert.     Coordination: Coordination normal.  Psychiatric:        Behavior: Behavior normal.      ED Treatments / Results  Labs (all labs ordered are listed, but only abnormal results are displayed) Labs Reviewed  CBC WITH DIFFERENTIAL/PLATELET  BASIC METABOLIC PANEL  TROPONIN I  MAGNESIUM    EKG EKG Interpretation  Date/Time:  Saturday October 02 2018 11:28:09 EST Ventricular Rate:  142 PR Interval:    QRS Duration: 83 QT Interval:  301 QTC Calculation: 463 R Axis:   10 Text Interpretation:  Atrial fibrillation with RVR LVH with secondary repolarization abnormality Anterior Q waves, possibly due to LVH ST depression, probably rate related compared with last EKG, afib has replaced NSR Confirmed by Noemi Chapel [phone]) on 107/24/202019 11:46:16 AM   Radiology No results found.  Procedures .Critical Care Performed by: Noemi Chapel, MD Authorized by: Noemi Chapel, MD   Critical care provider statement:    Critical care time (minutes):  35   Critical care time was exclusive of:  Separately billable procedures and treating other patients and teaching time   Critical care was necessary to treat or prevent imminent or life-threatening deterioration of the following conditions:  Circulatory failure   Critical care was time spent personally by me on the following activities:  Blood draw for specimens, development of treatment plan with patient or  surrogate, discussions with consultants, evaluation of patient's response to treatment, examination of patient, obtaining history from patient or surrogate, ordering and performing treatments and interventions, ordering and review of laboratory studies, ordering and review of radiographic studies, pulse oximetry, re-evaluation of patient's condition and review of old charts   (including critical care time)  Medications Ordered in  ED Medications  diltiazem (CARDIZEM) 1 mg/mL load via infusion 10 mg (has no administration in time range)    And  diltiazem (CARDIZEM) 100 mg in dextrose 5% 188m (1 mg/mL) infusion (has no administration in time range)     Initial Impression / Assessment and Plan / ED Course  I have reviewed the triage vital signs and the nursing notes.  Pertinent labs & imaging results that were available during my care of the patient were reviewed by me and considered in my medical decision making (see chart for details).  Clinical Course as of Oct 02 1316  Sat Oct 02, 2018  1303 Chronically elevated trop - BP improved after cardizem   [BM]    Clinical Course User Index [BM] MNoemi Chapel MD    The patient's EKG shows a tachycardia, it looks more like a atrial fibrillation with rapid ventricular rate.  She will need Cardizem which she Artie takes by mouth but will give through IV route, she may need to be readmitted to the hospital.  She is anticoagulated appropriately.  The patient is tachycardic, she is mildly hypotensive, I am concerned that this patient may need to have persistent rate control possible cardioversion, critically ill  Patient is currently on a Cardizem drip, troponin is elevated though it is chronically, discussed with Dr. HTerrence Dupontwho will admit to the hospital.  Final Clinical Impressions(s) / ED Diagnoses   Final diagnoses:  Atrial fibrillation with rapid ventricular response ([place]  Elevated troponin      MNoemi Chapel MD 10/02/18 1318

## 2018-10-02 NOTE — ED Triage Notes (Signed)
Pt has been having episodes of passing out. Initially hypotensive in triage. Noted to be in afib RVR. Skin warm and dry.

## 2018-10-02 NOTE — ED Notes (Signed)
CRITICAL VALUE ALERT  Critical Value:  Troponin 0.39  Date & Time Notied:  10-02-18 1304  Provider Notified: Sabra Heck  Orders Received/Actions taken: No orders obtained.

## 2018-10-02 NOTE — H&P (Signed)
Abigail [name] is an 72 y.o. female.   Chief Complaint: Recurrent palpitation associated with near syncopal episode HPI: Patient is 72 year old female with past medical history significant for multiple medical problems i.e. coronary artery disease history of MI in remote past status post PCI to left circumflex and LAD in the past, history of bilateral renal artery stenosis status post stenting in 2005, hypertension, hyperlipidemia, GERD, history of multiple myeloma, anemia of chronic disease, chronic kidney disease stage III, history of paroxysmal atrial fibrillation/SVT, came to ER complaining of recurrent palpitation associated with feeling dizzy weak and near syncopal episodes since yesterday today symptoms got worse so decided to come to ED.  Patient was noted to be in A. fib with RVR received IV bolus Cardizem and was started on drip with control of heart rate in 120s.  Patient denies any anginal chest pain or shortness of breath.  Denies PND orthopnea leg swelling.  Patient was noted to have minimally elevated troponin and I EKG showed no new acute ischemic changes.  Past Medical History:  Diagnosis Date  . ACS (acute coronary syndrome) ([place]) 06/2017  . Anemia    "off and on all my life" (12/22/2016)  . Anxiety   . Bilateral renal artery stenosis ([place]) 1999   s/p stenting  . Chronic kidney disease (CKD), stage III (moderate) ([place])    Archie Endo 12/22/2016  . Coronary artery disease 1999  . Depression   . Diverticulosis   . Duodenitis   . DVT (deep venous thrombosis) ([facility])    on Coumadin  . Dyspnea   . Gastric erosions   . Gastric ulcer   . GERD (gastroesophageal reflux disease)   . Heart murmur   . History of blood transfusion ~ 03/2016   "low HgB; practically nonexistent"  . Hyperlipidemia   . Hypertension   . MI (myocardial infarction) ([place]) 1999  . Multiple myeloma ([place]) dx'd ~ 04/2016  . PAT (paroxysmal atrial tachycardia) ([place]) 12/22/2016  . Retinal hemorrhage, right eye   . Schatzki's  ring   . Tachy-brady syndrome ([place])    Archie Endo 12/22/2016  . Tubular adenoma of colon     Past Surgical History:  Procedure Laterality Date  . APPENDECTOMY    . BACK SURGERY    . CATARACT EXTRACTION W/ INTRAOCULAR LENS IMPLANT Right 2014  . COLONOSCOPY WITH PROPOFOL N/A 09/04/2017   Procedure: COLONOSCOPY WITH PROPOFOL;  Surgeon: Doran Stabler, MD;  Location: [facility];  Service: Gastroenterology;  Laterality: N/A;  . CORONARY ANGIOGRAPHY N/A 01/23/2017   Procedure: Coronary Angiography;  Surgeon: [name], MD;  Location: [facility];  Service: Cardiovascular;  Laterality: N/A;  . CORONARY ANGIOPLASTY WITH STENT PLACEMENT  1999  . CORONARY STENT INTERVENTION N/A 12/29/2016   Procedure: Coronary Stent Intervention;  Surgeon: Charolette Forward, MD;  Location: [facility];  Service: Cardiovascular;  Laterality: N/A;  . CORONARY STENT INTERVENTION N/A 02/24/2017   Procedure: Coronary Stent Intervention;  Surgeon: Charolette Forward, MD;  Location: [facility];  Service: Cardiovascular;  Laterality: N/A;  . ESOPHAGOGASTRODUODENOSCOPY N/A 11/03/2015   Procedure: ESOPHAGOGASTRODUODENOSCOPY (EGD);  Surgeon: Carol Ada, MD;  [facility];  Service: Endoscopy;  Laterality: N/A;  . ESOPHAGOGASTRODUODENOSCOPY N/A 09/03/2017   Procedure: ESOPHAGOGASTRODUODENOSCOPY (EGD);  Surgeon: Doran Stabler, MD;  Location: [facility];  Service: Gastroenterology;  Laterality: N/A;  . ESOPHAGOGASTRODUODENOSCOPY (EGD) WITH PROPOFOL N/A 11/18/2016   Procedure: ESOPHAGOGASTRODUODENOSCOPY (EGD) WITH PROPOFOL;  Surgeon: Ladene Artist, MD;  Location: [facility];  Service: Endoscopy;  Laterality: N/A;  . LEFT HEART CATH AND CORONARY ANGIOGRAPHY N/A 12/24/2016   Procedure: Left Heart Cath and Coronary Angiography;  Surgeon: [name], MD;  Location: [facility];  Service: Cardiovascular;  Laterality: N/A;  . LEFT HEART CATH AND CORONARY ANGIOGRAPHY N/A 02/24/2017   Procedure:  Left Heart Cath and Coronary Angiography;  Surgeon: Charolette Forward, MD;  Location: [facility];  Service: Cardiovascular;  Laterality: N/A;  . LEFT HEART CATH AND CORONARY ANGIOGRAPHY N/A 03/05/2017   Procedure: Left Heart Cath and Coronary Angiography;  Surgeon: [name], MD;  Location: [facility];  Service: Cardiovascular;  Laterality: N/A;  . LEFT HEART CATH AND CORONARY ANGIOGRAPHY N/A 09/02/2018   Procedure: LEFT HEART CATH AND CORONARY ANGIOGRAPHY;  Surgeon: [name], MD;  Location: [facility];  Service: Cardiovascular;  Laterality: N/A;  . LUMBAR [place]    . RENAL ARTERY STENT  1999   bil RAS so ? bil vs unilateral stents  . RETINAL LASER PROCEDURE Right 2012   "bleeding"  . TONSILLECTOMY    . TOTAL ABDOMINAL HYSTERECTOMY      Family History  Problem Relation Age of Onset  . Breast cancer Mother   . Breast cancer Sister   . Breast cancer Maternal Aunt   . Colon cancer Neg Hx    Social History:  reports that she has been smoking cigarettes. She has a 25.00 pack-year smoking history. She has never used smokeless tobacco. She reports that she does not drink alcohol or use drugs.  Allergies:  Allergies  Allergen Reactions  . Sulfa Antibiotics Rash    (Not in a hospital admission)   Results for orders placed or performed during the hospital encounter of 10/02/18 (from the past 48 hour(s))  CBC with Differential/Platelet     Status: Abnormal   Collection Time: 10/02/18 11:51 AM  Result Value Ref Range   WBC 4.3 4.0 - 10.5 K/uL   RBC 3.46 (L) 3.87 - 5.11 MIL/uL   Hemoglobin 9.7 (L) 12.0 - 15.0 g/dL   HCT 32.8 (L) 36.0 - 46.0 %   MCV 94.8 80.0 - 100.0 fL   MCH 28.0 26.0 - 34.0 pg   MCHC 29.6 (L) 30.0 - 36.0 g/dL   RDW 19.3 (H) 11.5 - 15.5 %   Platelets 199 150 - 400 K/uL   nRBC 0.0 0.0 - 0.2 %   Neutrophils Relative % 70 %   Neutro Abs 3.1 1.7 - 7.7 K/uL   Lymphocytes Relative 13 %   Lymphs Abs 0.6 (L) 0.7 - 4.0 K/uL   Monocytes  Relative 14 %   Monocytes Absolute 0.6 0.1 - 1.0 K/uL   Eosinophils Relative 1 %   Eosinophils Absolute 0.0 0.0 - 0.5 K/uL   Basophils Relative 1 %   Basophils Absolute 0.0 0.0 - 0.1 K/uL   Immature Granulocytes 1 %   Abs Immature Granulocytes 0.03 0.00 - 0.07 K/uL    Comment: Performed at [facility], [address] [place], [place] [address]  Basic metabolic panel     Status: Abnormal   Collection Time: 10/02/18 11:51 AM  Result Value Ref Range   Sodium 139 135 - 145 mmol/L   Potassium 3.7 3.5 - 5.1 mmol/L   Chloride 108 98 - 111 mmol/L   CO2 19 (L) 22 - 32 mmol/L   Glucose, Bld 108 (H) 70 - 99 mg/dL   BUN 17 8 - 23  mg/dL   Creatinine, Ser 1.70 (H) 0.44 - 1.00 mg/dL   Calcium 9.3 8.9 - 10.3 mg/dL   GFR calc non Af Amer 30 (L) >60 mL/min   GFR calc Af Amer 35 (L) >60 mL/min   Anion gap 12 5 - 15    Comment: Performed at [facility] [address] [place], [place] [address]  Troponin I - ONCE - STAT     Status: Abnormal   Collection Time: 10/02/18 11:51 AM  Result Value Ref Range   Troponin I 0.39 (HH) <0.03 ng/mL    Comment: CRITICAL RESULT CALLED TO, READ BACK BY AND VERIFIED WITH: C.PRICE,RN @ 1301 103/28/202019 [place] Performed at [facility], [place] [address] [place], [place] [address]   Magnesium     Status: Abnormal   Collection Time: 10/02/18 11:51 AM  Result Value Ref Range   Magnesium 1.4 (L) 1.7 - 2.4 mg/dL    Comment: Performed at [facility] [address], [place],  [address]   Dg Chest Port 1 View  Result Date: 103/28/202019 CLINICAL DATA:  Weakness and atrial fibrillation. EXAM: PORTABLE CHEST 1 VIEW COMPARISON:  09/15/2018 FINDINGS: The heart size and mediastinal contours are within normal limits. Aortic atherosclerosis noted. Both lungs are clear. The visualized skeletal structures are unremarkable. IMPRESSION: No active disease. Electronically Signed   By: Kerby Moors M.D.   On: 103/28/202019 12:15    Review of Systems   Constitutional: Positive for malaise/fatigue. Negative for diaphoresis.  HENT: Negative for hearing loss.   Eyes: Negative for blurred vision.  Respiratory: Negative for shortness of breath.   Cardiovascular: Positive for palpitations. Negative for chest pain, orthopnea and claudication.  Gastrointestinal: Negative for abdominal pain, nausea and vomiting.  Genitourinary: Negative for dysuria.  Neurological: Positive for dizziness.    Blood pressure 92/66, pulse (!) 45, temperature (!) 97.1 F (36.2 C), temperature source Oral, resp. rate (!) 21, height _0  (1.651 m), weight 70.8 kg, SpO2 100 %. Physical Exam  Constitutional: She is oriented to person, place, and time.  HENT:  Head: Normocephalic and atraumatic.  Eyes: Pupils are equal, round, and reactive to light. Conjunctivae are normal. Left eye exhibits no discharge. No scleral icterus.  Neck: Normal range of motion. Neck supple. No JVD present. No tracheal deviation present. No thyromegaly present.  Cardiovascular:  Tachycardic irregularly irregular S1-S2 soft there is 2/6 systolic murmur noted  Respiratory: Effort normal and breath sounds normal. No respiratory distress. She has no wheezes. She has no rales.  GI: Soft. Bowel sounds are normal. She exhibits no distension. There is no abdominal tenderness. There is no rebound.  Musculoskeletal:        General: No tenderness, deformity or edema.  Neurological: She is alert and oriented to person, place, and time.     Assessment/Plan Recurrent A. fib with RVR chads vas score of 5 on chronic anticoagulation Status post near syncope secondary to above Minimally elevated troponin secondary to demand ischemia secondary to above doubt significant MI History of SVT and multifocal atrial tachycardia in the past Hypertension Hyperlipidemia CAD status post PCI to left circumflex and LAD in the past History of bilateral renal artery stenting in the past Multiple myeloma GERD Chronic  kidney disease stage III Anemia of chronic disease Plan As per orders  Charolette Forward, MD 103/28/202019, 2:25 PM

## 2018-10-03 ENCOUNTER — Other Ambulatory Visit: Payer: Self-pay | Admitting: Oncology

## 2018-10-03 DIAGNOSIS — I129 Hypertensive chronic kidney disease with stage 1 through stage 4 chronic kidney disease, or unspecified chronic kidney disease: Secondary | ICD-10-CM | POA: Diagnosis not present

## 2018-10-03 DIAGNOSIS — N183 Chronic kidney disease, stage 3 (moderate): Secondary | ICD-10-CM | POA: Diagnosis not present

## 2018-10-03 DIAGNOSIS — I2510 Atherosclerotic heart disease of native coronary artery without angina pectoris: Secondary | ICD-10-CM | POA: Diagnosis not present

## 2018-10-03 DIAGNOSIS — I4891 Unspecified atrial fibrillation: Secondary | ICD-10-CM | POA: Diagnosis not present

## 2018-10-03 LAB — CBC
HCT: 29.3 % — ABNORMAL LOW (ref 36.0–46.0)
Hemoglobin: 8.7 g/dL — ABNORMAL LOW (ref 12.0–15.0)
MCH: 28.3 pg (ref 26.0–34.0)
MCHC: 29.7 g/dL — ABNORMAL LOW (ref 30.0–36.0)
MCV: 95.4 fL (ref 80.0–100.0)
NRBC: 0 % (ref 0.0–0.2)
PLATELETS: 190 10*3/uL (ref 150–400)
RBC: 3.07 MIL/uL — ABNORMAL LOW (ref 3.87–5.11)
RDW: 19.3 % — AB (ref 11.5–15.5)
WBC: 4 10*3/uL (ref 4.0–10.5)

## 2018-10-03 LAB — BASIC METABOLIC PANEL
Anion gap: 11 (ref 5–15)
BUN: 16 mg/dL (ref 8–23)
CHLORIDE: 111 mmol/L (ref 98–111)
CO2: 18 mmol/L — ABNORMAL LOW (ref 22–32)
CREATININE: 1.65 mg/dL — AB (ref 0.44–1.00)
Calcium: 8.4 mg/dL — ABNORMAL LOW (ref 8.9–10.3)
GFR calc non Af Amer: 31 mL/min — ABNORMAL LOW (ref >60)
GFR, EST AFRICAN AMERICAN: 36 mL/min — AB (ref >60)
Glucose, Bld: 90 mg/dL (ref 70–99)
Potassium: 3.9 mmol/L (ref 3.5–5.1)
Sodium: 140 mmol/L (ref 135–145)

## 2018-10-03 LAB — LIPID PANEL
Cholesterol: 136 mg/dL (ref 0–200)
HDL: 51 mg/dL (ref >40)
LDL Cholesterol: 42 mg/dL (ref 0–99)
Total CHOL/HDL Ratio: 2.7 RATIO
Triglycerides: 214 mg/dL — ABNORMAL HIGH (ref <150)
VLDL: 43 mg/dL — ABNORMAL HIGH (ref 0–40)

## 2018-10-03 LAB — TROPONIN I
Troponin I: 0.27 ng/mL (ref <0.03)
Troponin I: 0.2 ng/mL (ref <0.03)

## 2018-10-03 MED ORDER — AMIODARONE HCL 100 MG PO TABS: 100.0000 mg | ORAL_TABLET | Freq: Every day | ORAL | 3 refills | Status: DC

## 2018-10-03 MED ORDER — MAGNESIUM HYDROXIDE 400 MG/5ML PO SUSP
30.0000 mL | Freq: Four times a day (QID) | ORAL | Status: DC | PRN
  Administered 2018-10-03: 30 mL via ORAL
  Filled 2018-10-03: qty 30

## 2018-10-03 MED ORDER — TRAMADOL HCL 50 MG PO TABS
50.0000 mg | ORAL_TABLET | Freq: Once | ORAL | Status: AC
  Administered 2018-10-03: 50 mg via ORAL
  Filled 2018-10-03: qty 1

## 2018-10-03 MED ORDER — AMIODARONE HCL 100 MG PO TABS
100.0000 mg | ORAL_TABLET | Freq: Every day | ORAL | Status: DC
  Administered 2018-10-03: 100 mg via ORAL
  Filled 2018-10-03: qty 1

## 2018-10-03 MED ORDER — ATORVASTATIN CALCIUM 80 MG PO TABS: 40.0000 mg | ORAL_TABLET | Freq: Every day | ORAL | 3 refills | Status: DC

## 2018-10-03 NOTE — Discharge Summary (Signed)
Discharge summary dictated on 10/03/2018, dictation number is [phone]

## 2018-10-03 NOTE — Discharge Instructions (Signed)

## 2018-10-03 NOTE — Discharge Summary (Signed)
Abigail [name], Abigail [name] MEDICAL RECORD [id number] ACCOUNT [id number] DATE OF BIRTH:July 15, 1946 FACILITY: [facility] LOCATION: [id number] PHYSICIAN:Hallee Mckenny Daivd Council, MD  DISCHARGE SUMMARY  DATE OF DISCHARGE:  10/03/2018  DATE OF ADMISSION:  112/28/2019  DATE OF DISCHARGE:  10/03/2018  ADMITTING DIAGNOSES: 1.  Recurrent atrial fibrillation with rapid ventricular response, CHADS-VASc score of 5, on chronic anticoagulation, status post near syncope secondary to above. 2.  Minimally elevated troponin secondary to demand ischemia secondary to above.  Doubt significant myocardial infarction. 3.  History of supraventricular tachycardia and multifocal atrial tachycardia in the past. 4.  Hypertension. 5.  Hyperlipidemia. 6.  Coronary artery disease, history of percutaneous coronary intervention to left circumflex and left anterior descending in the past. 7.  Peripheral vascular disease. 8.  Status post bilateral renal stenting in the past. 9.  Multiple myeloma. 10.  Gastroesophageal reflux disease. 11.  Chronic kidney disease stage III. 12.  Anemia of chronic disease.  DISCHARGE DIAGNOSES: 1.  Status post recurrent atrial fibrillation with rapid ventricular rate, CHADS-VASc score of 5 converted to sinus rhythm.  The patient is on chronic anticoagulation. 2.  Status post near syncope. 3.  Minimally elevated troponin I secondary to demand ischemia which are trending down.  Doubt significant myocardial infarction. 4.  History of supraventricular tachycardia/multifocal atrial tachycardia in the past. 5.  Hypertension. 6.  Hyperlipidemia. 7.  Coronary artery disease status post percutaneous coronary intervention to left circumflex and left anterior descending in the past. 8.  Peripheral vascular disease, status post bilateral renal artery stenting in the past. 9.  Multiple myeloma. 10.  Gastroesophageal reflux disease. 11.  Chronic kidney disease stage III. 12.  Anemia of chronic disease.  DISCHARGE  HOME MEDICATIONS:   1.  Acyclovir 400 mg twice daily. 2.  Eliquis 2.5 mg twice daily. 3.  Calcium carbonate 1250 mg 1 tablet daily with breakfast. 4.  Ferrous sulfate 325 mg 1 tablet daily. 5.  Nitrostat 0.4 mg sublingual p.r.n. 6.  Zofran 8 mg disintegrating tablets every 8 hours as needed for nausea and vomiting as before. 7.  Pantoprazole 40 mg 1 tablet twice daily. 8.  Compazine 5 mg 1 tablet every 6 hours as needed. 9.  Mestinon 60 mg half tablet by mouth 2 times daily. 10.  Zanaflex 4 mg at bedtime as needed for muscle spasm. 11.  Atorvastatin 40 mg daily. 12.  Feeding supplement Ensure twice daily as before. 13.  Amiodarone 100 mg 1 tablet daily.   14.  The patient has been advised to stop the diltiazem, isosorbide mononitrate, potassium chloride and tramadol.  DIET:  Low salt, low cholesterol.  FOLLOWUP:  With Dr. Doylene Canard in 1 week.  CONDITION AT DISCHARGE:  Stable.  BRIEF HISTORY AND HOSPITAL COURSE:  The patient is a 72 year old female with past medical history significant for multiple medical problems, i.e., coronary artery disease, history of MI in the remote past, status post PCI to left circumflex and left  anterior descending in the past, history of bilateral renal artery stenosis status post stenting in 2005, hypertension, hyperlipidemia, GERD, history of multiple myeloma, anemia of chronic disease, chronic kidney disease stage III, history of paroxysmal  AFib/SVT and atrial tachycardia in the past.  She came to the ER complaining of recurrent palpitations associated with feeling dizzy, weak and near syncopal episodes since yesterday.  Today symptoms got worse, so decided to come to the ED.  The patient  was noted to be in AFib with RVR, received IV bolus of Cardizem and was started  on drip with control of heart rate in the 120s.  The patient also was noted to be hypotensive, blood pressure in 90s.  The patient denies any anginal chest pain, no shortness  of breath.  Denies  PND, orthopnea, leg swelling.  The patient was noted to have minimally elevated troponin I.  EKG showed no new acute ischemic changes.  PHYSICAL EXAMINATION: GENERAL:  She was alert, awake, oriented x3. VITAL SIGNS:  Blood pressure was 92/66.  She was AFib with RVR on the monitor, heart rate in the 120s to 130s. HEENT:  Conjunctivae pink. NECK:  Supple, no JVD. LUNGS:  Clear to auscultation without rhonchi or rales. CARDIOVASCULAR:  Irregularly irregular S1, S2 was soft.  There was II/VI systolic murmur. ABDOMEN:  Soft.  Bowel sounds are present, nontender. EXTREMITIES:  There is no clubbing, cyanosis or edema.  LABORATORY DATA:  Sodium was 139, potassium 3.7, BUN 17, creatinine 1.70, troponin I's were 0.39, 0.35, 0.27, 0.20 which is trending down.  Hemoglobin was 9.7, hematocrit 32.8, white count of 4.3.  BRIEF HOSPITAL COURSE:  The patient was admitted to telemetry unit, was started on initially IV Cardizem which was discontinued and switched to IV amiodarone bolus followed by the drip and was continued on her chronic anticoagulation with apixaban.  The  patient subsequently converted to sinus rhythm and has remained in sinus rhythm with sinus bradycardia on the monitor with occasional PVCs.  The patient did not have any episodes of chest pain during the hospital stay.  The patient has chronic abdominal  pain and has appointment to see GI as outpatient.  The patient states she had a colonoscopy in the recent past which was not revealing.  The patient will be discharged home on the above medications and will be followed by Dr. Doylene Canard in 1 week and she  will follow up with GI and oncology as scheduled.  TN/NUANCE D:10/03/2018 T:10/03/2018 JOB:[id number]

## 2018-10-07 ENCOUNTER — Other Ambulatory Visit: Payer: Self-pay | Admitting: *Deleted

## 2018-10-07 ENCOUNTER — Inpatient Hospital Stay: Payer: Medicare HMO

## 2018-10-07 ENCOUNTER — Other Ambulatory Visit: Payer: Medicare HMO

## 2018-10-07 ENCOUNTER — Inpatient Hospital Stay (HOSPITAL_BASED_OUTPATIENT_CLINIC_OR_DEPARTMENT_OTHER): Payer: Medicare HMO | Admitting: Oncology

## 2018-10-07 VITALS — BP 162/85 | HR 73 | Temp 98.7°F | Resp 17 | Ht 65.0 in | Wt 156.0 lb

## 2018-10-07 DIAGNOSIS — C9002 Multiple myeloma in relapse: Secondary | ICD-10-CM

## 2018-10-07 DIAGNOSIS — D630 Anemia in neoplastic disease: Secondary | ICD-10-CM

## 2018-10-07 DIAGNOSIS — Z86718 Personal history of other venous thrombosis and embolism: Secondary | ICD-10-CM

## 2018-10-07 DIAGNOSIS — C9000 Multiple myeloma not having achieved remission: Secondary | ICD-10-CM

## 2018-10-07 DIAGNOSIS — I2510 Atherosclerotic heart disease of native coronary artery without angina pectoris: Secondary | ICD-10-CM | POA: Diagnosis not present

## 2018-10-07 DIAGNOSIS — Z5112 Encounter for antineoplastic immunotherapy: Secondary | ICD-10-CM | POA: Diagnosis not present

## 2018-10-07 LAB — CMP (CANCER CENTER ONLY)
ALT: 7 U/L (ref 0–44)
AST: 15 U/L (ref 15–41)
Albumin: 4 g/dL (ref 3.5–5.0)
Alkaline Phosphatase: 77 U/L (ref 38–126)
Anion gap: 12 (ref 5–15)
BUN: 8 mg/dL (ref 8–23)
CO2: 21 mmol/L — AB (ref 22–32)
Calcium: 9.5 mg/dL (ref 8.9–10.3)
Chloride: 107 mmol/L (ref 98–111)
Creatinine: 1.36 mg/dL — ABNORMAL HIGH (ref 0.44–1.00)
GFR, Est AFR Am: 45 mL/min — ABNORMAL LOW (ref >60)
GFR, Estimated: 39 mL/min — ABNORMAL LOW (ref >60)
Glucose, Bld: 103 mg/dL — ABNORMAL HIGH (ref 70–99)
Potassium: 3 mmol/L — CL (ref 3.5–5.1)
Sodium: 140 mmol/L (ref 135–145)
Total Bilirubin: 1.1 mg/dL (ref 0.3–1.2)
Total Protein: 8.5 g/dL — ABNORMAL HIGH (ref 6.5–8.1)

## 2018-10-07 LAB — CBC WITH DIFFERENTIAL (CANCER CENTER ONLY)
Abs Immature Granulocytes: 0.04 10*3/uL (ref 0.00–0.07)
Basophils Absolute: 0 10*3/uL (ref 0.0–0.1)
Basophils Relative: 1 %
Eosinophils Absolute: 0.1 10*3/uL (ref 0.0–0.5)
Eosinophils Relative: 1 %
HEMATOCRIT: 31.5 % — AB (ref 36.0–46.0)
Hemoglobin: 9.6 g/dL — ABNORMAL LOW (ref 12.0–15.0)
Immature Granulocytes: 1 %
Lymphocytes Relative: 6 %
Lymphs Abs: 0.3 10*3/uL — ABNORMAL LOW (ref 0.7–4.0)
MCH: 28.3 pg (ref 26.0–34.0)
MCHC: 30.5 g/dL (ref 30.0–36.0)
MCV: 92.9 fL (ref 80.0–100.0)
MONO ABS: 0.2 10*3/uL (ref 0.1–1.0)
Monocytes Relative: 4 %
Neutro Abs: 4.7 10*3/uL (ref 1.7–7.7)
Neutrophils Relative %: 87 %
Platelet Count: 269 10*3/uL (ref 150–400)
RBC: 3.39 MIL/uL — ABNORMAL LOW (ref 3.87–5.11)
RDW: 18.7 % — ABNORMAL HIGH (ref 11.5–15.5)
WBC Count: 5.3 10*3/uL (ref 4.0–10.5)
nRBC: 0 % (ref 0.0–0.2)

## 2018-10-07 MED ORDER — HYDROCODONE-ACETAMINOPHEN 5-325 MG PO TABS: 1.0000 | ORAL_TABLET | Freq: Two times a day (BID) | ORAL | 0 refills | Status: DC | PRN

## 2018-10-07 MED ORDER — BORTEZOMIB CHEMO SQ INJECTION 3.5 MG (2.5MG/ML)
1.5000 mg/m2 | Freq: Once | INTRAMUSCULAR | Status: AC
  Administered 2018-10-07: 2.75 mg via SUBCUTANEOUS
  Filled 2018-10-07: qty 1.1

## 2018-10-07 MED ORDER — ONDANSETRON HCL 8 MG PO TABS: 8.0000 mg | ORAL_TABLET | Freq: Once | ORAL | Status: DC

## 2018-10-07 MED ORDER — CYCLOPHOSPHAMIDE 50 MG PO CAPS: ORAL_CAPSULE | ORAL | 0 refills | Status: DC

## 2018-10-07 NOTE — Progress Notes (Signed)
MD reviewed labs: OK to treat with K+ level 3.0 today

## 2018-10-07 NOTE — Patient Instructions (Signed)
[facility] Discharge Instructions for Patients Receiving Chemotherapy  Today you received the following chemotherapy agent :  Velcade.  To help prevent nausea and vomiting after your treatment, we encourage you to take your nausea medication as prescribed.   If you develop nausea and vomiting that is not controlled by your nausea medication, call the clinic.   BELOW ARE SYMPTOMS THAT SHOULD BE REPORTED IMMEDIATELY:  *FEVER GREATER THAN 100.5 F  *CHILLS WITH OR WITHOUT FEVER  NAUSEA AND VOMITING THAT IS NOT CONTROLLED WITH YOUR NAUSEA MEDICATION  *UNUSUAL SHORTNESS OF BREATH  *UNUSUAL BRUISING OR BLEEDING  TENDERNESS IN MOUTH AND THROAT WITH OR WITHOUT PRESENCE OF ULCERS  *URINARY PROBLEMS  *BOWEL PROBLEMS  UNUSUAL RASH Items with * indicate a potential emergency and should be followed up as soon as possible.  Feel free to call the clinic should you have any questions or concerns. The clinic phone number is [phone].  Please show the CHEMO ALERT CARD at check-in to the Emergency Department and triage nurse.   

## 2018-10-07 NOTE — Addendum Note (Signed)
Addended by: Tania Ade on: 10/07/2018 10:33 AM   Modules accepted: Orders

## 2018-10-07 NOTE — Progress Notes (Signed)
[facility] OFFICE PROGRESS NOTE   Diagnosis: Multiple myeloma  INTERVAL HISTORY:   Ms. Nappi returns for a scheduled visit.  She was hospitalized on 104/12/2017 with atrial fibrillation with a rapid ventricular response.  She was treated with diltiazem and amiodarone.  She converted to sinus rhythm. She reports partial improvement in abdominal pain.  She was taken off of tramadol during the recent hospital admission.  She requested pain medication.  She also stopped potassium. She reports the lab is having increased difficulty obtaining IV access for lab draws. Objective:  Vital signs in last 24 hours:  Blood pressure (!) 162/85, pulse 73, temperature 98.7 F (37.1 C), temperature source Oral, resp. rate 17, height _0  (1.651 m), weight 156 lb (70.8 kg), SpO2 100 %.    HEENT: White coat over the tongue, no buccal thrush Resp: Lungs clear bilaterally Cardio: Regular rate and rhythm GI: No hepatosplenomegaly, no mass, mild tenderness in the left abdomen Vascular: No leg edema   Lab Results:  Lab Results  Component Value Date   WBC 5.3 10/07/2018   HGB 9.6 (L) 10/07/2018   HCT 31.5 (L) 10/07/2018   MCV 92.9 10/07/2018   PLT 269 10/07/2018   NEUTROABS 4.7 10/07/2018    CMP  Lab Results  Component Value Date   NA 140 10/07/2018   K 3.0 (LL) 10/07/2018   CL 107 10/07/2018   CO2 21 (L) 10/07/2018   GLUCOSE 103 (H) 10/07/2018   BUN 8 10/07/2018   CREATININE 1.36 (H) 10/07/2018   CALCIUM 9.5 10/07/2018   PROT 8.5 (H) 10/07/2018   ALBUMIN 4.0 10/07/2018   AST 15 10/07/2018   ALT 7 10/07/2018   ALKPHOS 77 10/07/2018   BILITOT 1.1 10/07/2018   GFRNONAA 39 (L) 10/07/2018   GFRAA 45 (L) 10/07/2018     Medications: I have reviewed the patient's current medications.   Assessment/Plan: 1. Multiple myeloma, IgG lambda, bone marrow biopsy 06/15/2016 confirmed multiple myeloma; cytogenetics by FISHshow +11, +12, 13q-  Elevated serum free lambda light  chains  Lambda light chain proteinuria  Lytic bone lesions on a bone survey 06/13/2016  Initiation of weekly Velcade/Decadron 06/19/2016  Serum light chains improved 07/31/2016  Initiation of Revlimid 11/03/20172 weeks on/2 weeks off  Serum M spike and IgG significantly improved 09/25/2016  Velcade held on 10/30/2016 due to urinary retention  Treatment resumed with Revlimid (2 weeks on/2 weeks off) and weekly Decadron on 02/12/2017, treatment placed on hold May 2018 secondary to admissions with cardiac disease  05/27/2018-increased serum M spike, IgG, serum lambda light chains  Bone survey 05/31/2018-widespread myeloma without detectable progression since 06/13/2016.  Cycle 1 Cytoxan/Velcade/Decadron 06/24/2018  Cycle 2 Cytoxan/Velcade/Decadron 07/22/2018  08/09/2018 serum light chains, M spike, IgGimproved  Cycle for Cytoxan/Velcade/Decadron 09/24/2018 2. Severe anemia secondary to #1-markedly improved  3. Diffuse lytic bone lesions secondary to multiple myeloma, status post Zometa 09/18/2016(plan to continue every 3 months);most recent Zometa infusion 10/08/2017  4. History of coronary artery disease/myocardial infarction  5. History of colon polyps  6. Bilateral leg edema 09/04/2016. Negative bilateral venous Doppler 09/05/2016.  7. Mild periorbital edema 09/18/2016, question related to early stye formation, question Velcade related chalazia.Improved.  8. CT scan 10/27/2016 with a new right kidney massevaluated by urology; MRI abdomen 11/18/2016 multiple bilateral renal cysts. Most of these are simple cysts. The 2 lesions in question on the CT scan are both hemorrhagic or proteinaceous cysts. No worrisome contrast enhancement.  9. Urinary retention 10/28/2016 status post evaluation in  the emergency department. Urinalysis negative for signs of infection 10/30/2016. Velcade held. Resolved.  10. Abdominal pain, nausea, vomiting.Etiology unclear. Upper endoscopy  11/18/2016 with findings of reflux esophagitis, benign appearing esophageal stenosis, small hiatal hernia and erythematous duodenopathy. CT abdomen/pelvis 11/19/2016 with no acute abnormality.  11. 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 Left lower extremity DVT May 2018.Maintainedon Coumadin.Coumadin discontinued October 2018 secondary to a supratherapeutic PT/INR and GI bleeding  13.Admission with GI bleeding October and November 2018-felt secondary to diverticular bleeding, initially while on supratherapeutic Coumadin, colonoscopy 09/04/2017-no source of blood loss identified, superficial tear at the posterior anal mucosa without stigmata of bleeding  14. Acute creatinine elevation 3.26 on 08/05/2018 of cycle 2 day 15; confirmed on repeat 3.13 on 08/06/18; improved  15.  Admission 1January 13, 202019 with rapid atrial fibrillation, now maintained on amiodarone     Disposition: Ms. Skibinski has multiple myeloma.  She is currently being treated with Cytoxan/Velcade/Decadron.  The plan is to continue chemotherapy today.  She will resume a potassium supplement.  She will be seen by gastroenterology for evaluation of the abdominal pain.  I prescribed hydrocodone to use as needed for pain.  We will follow-up on results from the restaging myeloma panel today.  Ms. Fosse will return for an office visit on 10/21/2018.  25 minutes were spent with the patient today.  The majority of the time was used for counseling and coordination of care.  [name], MD  10/07/2018  9:55 AM

## 2018-10-08 ENCOUNTER — Other Ambulatory Visit: Payer: Self-pay

## 2018-10-08 ENCOUNTER — Observation Stay (HOSPITAL_COMMUNITY)
Admission: EM | Admit: 2018-10-08 | Discharge: 2018-10-11 | Disposition: A | Discharge status: Home / Self Care | Payer: Medicare HMO | Attending: Cardiology | Admitting: Cardiology

## 2018-10-08 ENCOUNTER — Encounter (HOSPITAL_COMMUNITY): Payer: Self-pay

## 2018-10-08 ENCOUNTER — Emergency Department (HOSPITAL_COMMUNITY): Payer: Medicare HMO

## 2018-10-08 DIAGNOSIS — Z79899 Other long term (current) drug therapy: Secondary | ICD-10-CM | POA: Diagnosis not present

## 2018-10-08 DIAGNOSIS — N289 Disorder of kidney and ureter, unspecified: Secondary | ICD-10-CM

## 2018-10-08 DIAGNOSIS — E785 Hyperlipidemia, unspecified: Secondary | ICD-10-CM | POA: Insufficient documentation

## 2018-10-08 DIAGNOSIS — I471 Supraventricular tachycardia, unspecified: Secondary | ICD-10-CM | POA: Diagnosis present

## 2018-10-08 DIAGNOSIS — F17210 Nicotine dependence, cigarettes, uncomplicated: Secondary | ICD-10-CM | POA: Insufficient documentation

## 2018-10-08 DIAGNOSIS — E876 Hypokalemia: Secondary | ICD-10-CM

## 2018-10-08 DIAGNOSIS — K219 Gastro-esophageal reflux disease without esophagitis: Secondary | ICD-10-CM | POA: Insufficient documentation

## 2018-10-08 DIAGNOSIS — R7989 Other specified abnormal findings of blood chemistry: Secondary | ICD-10-CM | POA: Insufficient documentation

## 2018-10-08 DIAGNOSIS — I252 Old myocardial infarction: Secondary | ICD-10-CM | POA: Diagnosis not present

## 2018-10-08 DIAGNOSIS — N183 Chronic kidney disease, stage 3 (moderate): Secondary | ICD-10-CM | POA: Insufficient documentation

## 2018-10-08 DIAGNOSIS — I129 Hypertensive chronic kidney disease with stage 1 through stage 4 chronic kidney disease, or unspecified chronic kidney disease: Secondary | ICD-10-CM | POA: Diagnosis not present

## 2018-10-08 DIAGNOSIS — I2510 Atherosclerotic heart disease of native coronary artery without angina pectoris: Secondary | ICD-10-CM | POA: Diagnosis not present

## 2018-10-08 DIAGNOSIS — I4891 Unspecified atrial fibrillation: Secondary | ICD-10-CM | POA: Diagnosis not present

## 2018-10-08 DIAGNOSIS — R079 Chest pain, unspecified: Secondary | ICD-10-CM | POA: Diagnosis present

## 2018-10-08 DIAGNOSIS — Z7901 Long term (current) use of anticoagulants: Secondary | ICD-10-CM | POA: Diagnosis not present

## 2018-10-08 LAB — BASIC METABOLIC PANEL
Anion gap: 16 — ABNORMAL HIGH (ref 5–15)
Anion gap: 11 (ref 5–15)
Anion gap: 10 (ref 5–15)
BUN: 9 mg/dL (ref 8–23)
BUN: 9 mg/dL (ref 8–23)
BUN: 9 mg/dL (ref 8–23)
CO2: 21 mmol/L — ABNORMAL LOW (ref 22–32)
CO2: 22 mmol/L (ref 22–32)
CO2: 22 mmol/L (ref 22–32)
CREATININE: 1.22 mg/dL — AB (ref 0.44–1.00)
Calcium: 9.7 mg/dL (ref 8.9–10.3)
Calcium: 9 mg/dL (ref 8.9–10.3)
Calcium: 8.7 mg/dL — ABNORMAL LOW (ref 8.9–10.3)
Chloride: 102 mmol/L (ref 98–111)
Chloride: 107 mmol/L (ref 98–111)
Chloride: 109 mmol/L (ref 98–111)
Creatinine, Ser: 1.32 mg/dL — ABNORMAL HIGH (ref 0.44–1.00)
Creatinine, Ser: 1.07 mg/dL — ABNORMAL HIGH (ref 0.44–1.00)
GFR calc Af Amer: 47 mL/min — ABNORMAL LOW (ref >60)
GFR calc Af Amer: >60 mL/min (ref >60)
GFR calc Af Amer: 52 mL/min — ABNORMAL LOW (ref >60)
GFR calc non Af Amer: 40 mL/min — ABNORMAL LOW (ref >60)
GFR calc non Af Amer: 52 mL/min — ABNORMAL LOW (ref >60)
GFR calc non Af Amer: 45 mL/min — ABNORMAL LOW (ref >60)
GLUCOSE: 90 mg/dL (ref 70–99)
GLUCOSE: 91 mg/dL (ref 70–99)
Glucose, Bld: 114 mg/dL — ABNORMAL HIGH (ref 70–99)
Potassium: 2.7 mmol/L — CL (ref 3.5–5.1)
Potassium: 3.4 mmol/L — ABNORMAL LOW (ref 3.5–5.1)
Potassium: 4 mmol/L (ref 3.5–5.1)
Sodium: 139 mmol/L (ref 135–145)
Sodium: 140 mmol/L (ref 135–145)
Sodium: 141 mmol/L (ref 135–145)

## 2018-10-08 LAB — I-STAT TROPONIN, ED: Troponin i, poc: 0.28 ng/mL (ref 0.00–0.08)

## 2018-10-08 LAB — CBC WITH DIFFERENTIAL/PLATELET
Abs Immature Granulocytes: 0.2 10*3/uL — ABNORMAL HIGH (ref 0.00–0.07)
BASOS ABS: 0 10*3/uL (ref 0.0–0.1)
Basophils Relative: 0 %
EOS PCT: 0 %
Eosinophils Absolute: 0 10*3/uL (ref 0.0–0.5)
HEMATOCRIT: 32.1 % — AB (ref 36.0–46.0)
Hemoglobin: 9.8 g/dL — ABNORMAL LOW (ref 12.0–15.0)
Immature Granulocytes: 3 %
Lymphocytes Relative: 5 %
Lymphs Abs: 0.4 10*3/uL — ABNORMAL LOW (ref 0.7–4.0)
MCH: 28.2 pg (ref 26.0–34.0)
MCHC: 30.5 g/dL (ref 30.0–36.0)
MCV: 92.5 fL (ref 80.0–100.0)
Monocytes Absolute: 0.2 10*3/uL (ref 0.1–1.0)
Monocytes Relative: 3 %
Neutro Abs: 6.6 10*3/uL (ref 1.7–7.7)
Neutrophils Relative %: 89 %
Platelets: 301 10*3/uL (ref 150–400)
RBC: 3.47 MIL/uL — ABNORMAL LOW (ref 3.87–5.11)
RDW: 19.1 % — ABNORMAL HIGH (ref 11.5–15.5)
WBC: 7.4 10*3/uL (ref 4.0–10.5)
nRBC: 0 % (ref 0.0–0.2)

## 2018-10-08 LAB — KAPPA/LAMBDA LIGHT CHAINS
Kappa free light chain: 4.2 mg/L (ref 3.3–19.4)
Kappa, lambda light chain ratio: 0.06 — ABNORMAL LOW (ref 0.26–1.65)
Lambda free light chains: 72.4 mg/L — ABNORMAL HIGH (ref 5.7–26.3)

## 2018-10-08 LAB — MAGNESIUM
Magnesium: 1.5 mg/dL — ABNORMAL LOW (ref 1.7–2.4)
Magnesium: 2.3 mg/dL (ref 1.7–2.4)
Magnesium: 2.1 mg/dL (ref 1.7–2.4)

## 2018-10-08 LAB — IGG: IgG (Immunoglobin G), Serum: 2003 mg/dL — ABNORMAL HIGH (ref 700–1600)

## 2018-10-08 MED ORDER — POTASSIUM CHLORIDE CRYS ER 20 MEQ PO TBCR
20.0000 meq | EXTENDED_RELEASE_TABLET | Freq: Every day | ORAL | Status: DC
  Administered 2018-10-09 – 2018-10-11 (×3): 20 meq via ORAL
  Filled 2018-10-08 (×3): qty 1

## 2018-10-08 MED ORDER — FERROUS SULFATE 325 (65 FE) MG PO TABS
325.0000 mg | ORAL_TABLET | Freq: Every day | ORAL | Status: DC
  Administered 2018-10-09 – 2018-10-11 (×3): 325 mg via ORAL
  Filled 2018-10-08 (×3): qty 1

## 2018-10-08 MED ORDER — ENSURE ENLIVE PO LIQD
237.0000 mL | Freq: Two times a day (BID) | ORAL | Status: DC
  Administered 2018-10-08 – 2018-10-09 (×3): 237 mL via ORAL

## 2018-10-08 MED ORDER — AMIODARONE HCL IN DEXTROSE 360-4.14 MG/200ML-% IV SOLN
INTRAVENOUS | Status: AC
  Administered 2018-10-08: 60 mg/h via INTRAVENOUS
  Filled 2018-10-08: qty 200

## 2018-10-08 MED ORDER — TIZANIDINE HCL 4 MG PO TABS
4.0000 mg | ORAL_TABLET | Freq: Every evening | ORAL | Status: DC | PRN
  Administered 2018-10-09 – 2018-10-11 (×2): 4 mg via ORAL
  Filled 2018-10-08 (×4): qty 1

## 2018-10-08 MED ORDER — ACETAMINOPHEN 325 MG PO TABS
650.0000 mg | ORAL_TABLET | ORAL | Status: DC | PRN
  Administered 2018-10-08: 650 mg via ORAL
  Filled 2018-10-08: qty 2

## 2018-10-08 MED ORDER — APIXABAN 2.5 MG PO TABS
2.5000 mg | ORAL_TABLET | Freq: Two times a day (BID) | ORAL | Status: DC
  Administered 2018-10-08 – 2018-10-11 (×7): 2.5 mg via ORAL
  Filled 2018-10-08 (×7): qty 1

## 2018-10-08 MED ORDER — PYRIDOSTIGMINE BROMIDE 60 MG PO TABS
30.0000 mg | ORAL_TABLET | Freq: Two times a day (BID) | ORAL | Status: DC
  Administered 2018-10-08 – 2018-10-11 (×7): 30 mg via ORAL
  Filled 2018-10-08 (×8): qty 0.5

## 2018-10-08 MED ORDER — MAGNESIUM SULFATE 2 GM/50ML IV SOLN
2.0000 g | Freq: Once | INTRAVENOUS | Status: AC
  Administered 2018-10-08: 2 g via INTRAVENOUS
  Filled 2018-10-08: qty 50

## 2018-10-08 MED ORDER — AMIODARONE HCL 100 MG PO TABS: 100.0000 mg | ORAL_TABLET | Freq: Every day | ORAL | Status: DC

## 2018-10-08 MED ORDER — ONDANSETRON HCL 4 MG/2ML IJ SOLN
4.0000 mg | Freq: Four times a day (QID) | INTRAMUSCULAR | Status: DC | PRN
  Administered 2018-10-09: 4 mg via INTRAVENOUS
  Filled 2018-10-08: qty 2

## 2018-10-08 MED ORDER — SODIUM CHLORIDE 0.9 % IV SOLN
INTRAVENOUS | Status: DC
  Administered 2018-10-09: 22:00:00 via INTRAVENOUS

## 2018-10-08 MED ORDER — ONDANSETRON 4 MG PO TBDP
8.0000 mg | ORAL_TABLET | Freq: Three times a day (TID) | ORAL | Status: DC | PRN
  Filled 2018-10-08: qty 2

## 2018-10-08 MED ORDER — PROCHLORPERAZINE MALEATE 5 MG PO TABS
5.0000 mg | ORAL_TABLET | Freq: Four times a day (QID) | ORAL | Status: DC | PRN
  Filled 2018-10-08: qty 1

## 2018-10-08 MED ORDER — POTASSIUM CHLORIDE 10 MEQ/100ML IV SOLN
10.0000 meq | INTRAVENOUS | Status: AC
  Administered 2018-10-08 (×4): 10 meq via INTRAVENOUS
  Filled 2018-10-08 (×4): qty 100

## 2018-10-08 MED ORDER — AMIODARONE HCL IN DEXTROSE 360-4.14 MG/200ML-% IV SOLN
30.0000 mg/h | INTRAVENOUS | Status: DC
  Administered 2018-10-08 – 2018-10-09 (×2): 30 mg/h via INTRAVENOUS
  Filled 2018-10-08 (×2): qty 200

## 2018-10-08 MED ORDER — POTASSIUM CHLORIDE CRYS ER 20 MEQ PO TBCR: 20.0000 meq | EXTENDED_RELEASE_TABLET | Freq: Every day | ORAL | Status: DC

## 2018-10-08 MED ORDER — ATORVASTATIN CALCIUM 40 MG PO TABS
40.0000 mg | ORAL_TABLET | Freq: Every day | ORAL | Status: DC
  Administered 2018-10-08 – 2018-10-09 (×2): 40 mg via ORAL
  Filled 2018-10-08 (×2): qty 1

## 2018-10-08 MED ORDER — HYDROCODONE-ACETAMINOPHEN 5-325 MG PO TABS
1.0000 | ORAL_TABLET | Freq: Two times a day (BID) | ORAL | Status: DC | PRN
  Administered 2018-10-08 – 2018-10-10 (×7): 1 via ORAL
  Filled 2018-10-08 (×7): qty 1

## 2018-10-08 MED ORDER — POTASSIUM CHLORIDE ER 20 MEQ PO TBCR: 20.0000 meq | EXTENDED_RELEASE_TABLET | Freq: Every day | ORAL | Status: DC

## 2018-10-08 MED ORDER — POTASSIUM CHLORIDE CRYS ER 20 MEQ PO TBCR
40.0000 meq | EXTENDED_RELEASE_TABLET | Freq: Once | ORAL | Status: AC
  Administered 2018-10-08: 40 meq via ORAL
  Filled 2018-10-08: qty 2

## 2018-10-08 MED ORDER — PANTOPRAZOLE SODIUM 40 MG PO TBEC
40.0000 mg | DELAYED_RELEASE_TABLET | Freq: Two times a day (BID) | ORAL | Status: DC
  Administered 2018-10-08 – 2018-10-11 (×5): 40 mg via ORAL
  Filled 2018-10-08 (×5): qty 1

## 2018-10-08 MED ORDER — AMIODARONE HCL IN DEXTROSE 360-4.14 MG/200ML-% IV SOLN
60.0000 mg/h | INTRAVENOUS | Status: AC
  Administered 2018-10-08 (×3): 60 mg/h via INTRAVENOUS
  Filled 2018-10-08: qty 200

## 2018-10-08 MED ORDER — AMIODARONE IV BOLUS ONLY 150 MG/100ML
150.0000 mg | Freq: Once | INTRAVENOUS | Status: AC
  Administered 2018-10-08: 150 mg via INTRAVENOUS

## 2018-10-08 MED ORDER — NITROGLYCERIN 0.4 MG SL SUBL: 0.4000 mg | SUBLINGUAL_TABLET | SUBLINGUAL | Status: DC | PRN

## 2018-10-08 MED ORDER — ACYCLOVIR 400 MG PO TABS
400.0000 mg | ORAL_TABLET | Freq: Two times a day (BID) | ORAL | Status: DC
  Administered 2018-10-08 – 2018-10-11 (×7): 400 mg via ORAL
  Filled 2018-10-08 (×7): qty 1

## 2018-10-08 MED ORDER — CALCIUM CARBONATE 1250 (500 CA) MG PO TABS
1.0000 | ORAL_TABLET | Freq: Every day | ORAL | Status: DC
  Administered 2018-10-09 – 2018-10-11 (×3): 500 mg via ORAL
  Filled 2018-10-08 (×3): qty 1

## 2018-10-08 NOTE — ED Notes (Signed)
Attempted report 

## 2018-10-08 NOTE — H&P (Signed)
Abigail [name] is an 72 y.o. female.   Chief Complaint: Recurrent palpitations associated nausea vomiting HPI: Patient is 72 year old female with past medical history significant for multiple medical problems coronary artery disease history of MI in remote past status post PCI to left circumflex and LAD in the past, history of bilateral renal artery stenosis status post stenting in 2005 hypertension, hyperlipidemia and GERD History of multiple myeloma anemia of chronic disease chronic kidney disease stage III history of paroxysmal atrial fibrillation/SVT received chemotherapy yesterday and to ER complaining of palpitations associated with nausea or vomiting since early this morning.  Patient denies any chest pain denies shortness of breath.  States received chemotherapy yesterday for multiple myeloma.  Patient was initially noted to be in SVT subsequently converted to A. fib and then to sinus rhythm after receiving IV amiodarone bolus and was started on drip in the ED.  Patient also was noted to have marked hypokalemia and hypomagnesemia.  Patient was recently discharged from the hospital admitted with A. fib with RVR subsequently converted to sinus rhythm with IV amiodarone and was switched to p.o. and discharged home.  Patient was noted to have minimally elevated troponin EKG showed no new acute ischemic changes.  Patient recently had cardiac catheterization noted to have patent stents.  Patient has been evaluated in the past for SVT and atrial tachycardia and was felt not a candidate for invasive ablation.  Past Medical History:  Diagnosis Date  . ACS (acute coronary syndrome) ([place]) 06/2017  . Anemia    "off and on all my life" (12/22/2016)  . Anxiety   . Bilateral renal artery stenosis ([place]) 1999   s/p stenting  . Chronic kidney disease (CKD), stage III (moderate) ([place])    Archie Endo 12/22/2016  . Coronary artery disease 1999  . Depression   . Diverticulosis   . Duodenitis   . DVT (deep venous thrombosis)  ([facility])    on Coumadin  . Dyspnea   . Gastric erosions   . Gastric ulcer   . GERD (gastroesophageal reflux disease)   . Heart murmur   . History of blood transfusion ~ 03/2016   "low HgB; practically nonexistent"  . Hyperlipidemia   . Hypertension   . MI (myocardial infarction) ([place]) 1999  . Multiple myeloma ([place]) dx'd ~ 04/2016  . PAT (paroxysmal atrial tachycardia) ([place]) 12/22/2016  . Retinal hemorrhage, right eye   . Schatzki's ring   . Tachy-brady syndrome ([place])    Archie Endo 12/22/2016  . Tubular adenoma of colon     Past Surgical History:  Procedure Laterality Date  . APPENDECTOMY    . BACK SURGERY    . CATARACT EXTRACTION W/ INTRAOCULAR LENS IMPLANT Right 2014  . COLONOSCOPY WITH PROPOFOL N/A 09/04/2017   Procedure: COLONOSCOPY WITH PROPOFOL;  Surgeon: Doran Stabler, MD;  Location: [facility];  Service: Gastroenterology;  Laterality: N/A;  . CORONARY ANGIOGRAPHY N/A 01/23/2017   Procedure: Coronary Angiography;  Surgeon: [name], MD;  Location: [facility];  Service: Cardiovascular;  Laterality: N/A;  . CORONARY ANGIOPLASTY WITH STENT PLACEMENT  1999  . CORONARY STENT INTERVENTION N/A 12/29/2016   Procedure: Coronary Stent Intervention;  Surgeon: Charolette Forward, MD;  Location: [facility];  Service: Cardiovascular;  Laterality: N/A;  . CORONARY STENT INTERVENTION N/A 02/24/2017   Procedure: Coronary Stent Intervention;  Surgeon: Charolette Forward, MD;  Location: [facility];  Service: Cardiovascular;  Laterality: N/A;  . ESOPHAGOGASTRODUODENOSCOPY N/A 11/03/2015   Procedure: ESOPHAGOGASTRODUODENOSCOPY (EGD);  Surgeon: Carol Ada, MD;  Location: [facility];  Service: Endoscopy;  Laterality: N/A;  . ESOPHAGOGASTRODUODENOSCOPY N/A 09/03/2017   Procedure: ESOPHAGOGASTRODUODENOSCOPY (EGD);  Surgeon: Doran Stabler, MD;  Location: [facility];  Service: Gastroenterology;  Laterality: N/A;  . ESOPHAGOGASTRODUODENOSCOPY (EGD) WITH PROPOFOL N/A 11/18/2016    Procedure: ESOPHAGOGASTRODUODENOSCOPY (EGD) WITH PROPOFOL;  Surgeon: Ladene Artist, MD;  Location: [facility];  Service: Endoscopy;  Laterality: N/A;  . LEFT HEART CATH AND CORONARY ANGIOGRAPHY N/A 12/24/2016   Procedure: Left Heart Cath and Coronary Angiography;  Surgeon: [name], MD;  Location: [facility];  Service: Cardiovascular;  Laterality: N/A;  . LEFT HEART CATH AND CORONARY ANGIOGRAPHY N/A 02/24/2017   Procedure: Left Heart Cath and Coronary Angiography;  Surgeon: Charolette Forward, MD;  Location: [facility];  Service: Cardiovascular;  Laterality: N/A;  . LEFT HEART CATH AND CORONARY ANGIOGRAPHY N/A 03/05/2017   Procedure: Left Heart Cath and Coronary Angiography;  Surgeon: [name], MD;  Location: [facility];  Service: Cardiovascular;  Laterality: N/A;  . LEFT HEART CATH AND CORONARY ANGIOGRAPHY N/A 09/02/2018   Procedure: LEFT HEART CATH AND CORONARY ANGIOGRAPHY;  Surgeon: [name], MD;  Location: [facility];  Service: Cardiovascular;  Laterality: N/A;  . LUMBAR [place]    . RENAL ARTERY STENT  1999   bil RAS so ? bil vs unilateral stents  . RETINAL LASER PROCEDURE Right 2012   "bleeding"  . TONSILLECTOMY    . TOTAL ABDOMINAL HYSTERECTOMY      Family History  Problem Relation Age of Onset  . Breast cancer Mother   . Breast cancer Sister   . Breast cancer Maternal Aunt   . Colon cancer Neg Hx    Social History:  reports that she has been smoking cigarettes. She has a 25.00 pack-year smoking history. She has never used smokeless tobacco. She reports that she does not drink alcohol or use drugs.  Allergies:  Allergies  Allergen Reactions  . Sulfa Antibiotics Rash    (Not in a hospital admission)   Results for orders placed or performed during the hospital encounter of 10/08/18 (from the past 48 hour(s))  I-stat troponin, ED     Status: Abnormal   Collection Time: 10/08/18  4:21 AM  Result Value Ref Range   Troponin i, poc  0.28 (HH) 0.00 - 0.08 ng/mL   Comment NOTIFIED PHYSICIAN    Comment 3            Comment: Due to the release kinetics of cTnI, a negative result within the first hours of the onset of symptoms does not rule out myocardial infarction with certainty. If myocardial infarction is still suspected, repeat the test at appropriate intervals.   Basic metabolic panel     Status: Abnormal   Collection Time: 10/08/18  4:22 AM  Result Value Ref Range   Sodium 139 135 - 145 mmol/L   Potassium 2.7 (LL) 3.5 - 5.1 mmol/L    Comment: CRITICAL RESULT CALLED TO, READ BACK BY AND VERIFIED WITH: FERRAINOLO J,RN 10/08/18 0508 WAYK    Chloride 102 98 - 111 mmol/L   CO2 21 (L) 22 - 32 mmol/L   Glucose, Bld 114 (H) 70 - 99 mg/dL   BUN 9 8 - 23 mg/dL   Creatinine, Ser 1.32 (H) 0.44 - 1.00 mg/dL   Calcium 9.7 8.9 - 10.3 mg/dL   GFR calc non Af Amer 40 (L) >60 mL/min   GFR calc  Af Amer 47 (L) >60 mL/min   Anion gap 16 (H) 5 - 15    Comment: Performed at [facility], [place] [address] [place], [place] [address]  CBC with Differential     Status: Abnormal   Collection Time: 10/08/18  4:22 AM  Result Value Ref Range   WBC 7.4 4.0 - 10.5 K/uL   RBC 3.47 (L) 3.87 - 5.11 MIL/uL   Hemoglobin 9.8 (L) 12.0 - 15.0 g/dL   HCT 32.1 (L) 36.0 - 46.0 %   MCV 92.5 80.0 - 100.0 fL   MCH 28.2 26.0 - 34.0 pg   MCHC 30.5 30.0 - 36.0 g/dL   RDW 19.1 (H) 11.5 - 15.5 %   Platelets 301 150 - 400 K/uL   nRBC 0.0 0.0 - 0.2 %   Neutrophils Relative % 89 %   Neutro Abs 6.6 1.7 - 7.7 K/uL   Lymphocytes Relative 5 %   Lymphs Abs 0.4 (L) 0.7 - 4.0 K/uL   Monocytes Relative 3 %   Monocytes Absolute 0.2 0.1 - 1.0 K/uL   Eosinophils Relative 0 %   Eosinophils Absolute 0.0 0.0 - 0.5 K/uL   Basophils Relative 0 %   Basophils Absolute 0.0 0.0 - 0.1 K/uL   Immature Granulocytes 3 %   Abs Immature Granulocytes 0.20 (H) 0.00 - 0.07 K/uL    Comment: Performed at [facility], [place] [address], [place], [place] [address]   Magnesium     Status: Abnormal   Collection Time: 10/08/18  4:22 AM  Result Value Ref Range   Magnesium 1.5 (L) 1.7 - 2.4 mg/dL    Comment: Performed at [facility] [address] [place], [place] [address]   Dg Chest Port 1 View  Result Date: 10/08/2018 CLINICAL DATA:  Shortness of breath, palpitations, and vomiting since yesterday. Chest pain. Current smoker. EXAM: PORTABLE CHEST 1 VIEW COMPARISON:  [id number] FINDINGS: Normal heart size and pulmonary vascularity. No focal airspace disease or consolidation in the lungs. No blunting of costophrenic angles. No pneumothorax. Mediastinal contours appear intact. Calcified and mildly tortuous aorta. IMPRESSION: No active disease. Electronically Signed   By: Lucienne Capers M.D.   On: 10/08/2018 04:45    Review of Systems  Constitutional: Positive for malaise/fatigue. Negative for chills and fever.  HENT: Negative for hearing loss.   Eyes: Negative for blurred vision.  Respiratory: Negative for cough and shortness of breath.   Cardiovascular: Positive for palpitations. Negative for chest pain, claudication and leg swelling.  Gastrointestinal: Positive for nausea and vomiting.  Genitourinary: Negative for dysuria.  Neurological: Negative for dizziness.    Blood pressure 137/85, pulse 60, temperature 98.7 F (37.1 C), temperature source Oral, resp. rate 15, SpO2 99 %. Physical Exam  Constitutional: She is oriented to person, place, and time.  HENT:  Head: Normocephalic and atraumatic.  Eyes: Pupils are equal, round, and reactive to light. Conjunctivae are normal. Left eye exhibits no discharge. No scleral icterus.  Neck: Normal range of motion. Neck supple. No JVD present. No tracheal deviation present. No thyromegaly present.  Cardiovascular: Normal rate and regular rhythm.  Murmur (2/6 systolic murmur noted no S3 gallop) heard. Respiratory: Effort normal and breath sounds normal. No respiratory distress. She has no wheezes.  She has no rales.  GI: Soft. Bowel sounds are normal. She exhibits no distension. There is no abdominal tenderness. There is no rebound and no guarding.  Musculoskeletal:        General:  No tenderness, deformity or edema.  Neurological: She is alert and oriented to person, place, and time.     Assessment/Plan Recurrent SVT/A. fib with RVR chads vas score of 5 on chronic anticoagulation Minimally elevated troponin secondary to demand ischemia secondary to above doubt significant MI History of SVT and multifocal atrial tachycardia in the past Hypertension Hyperlipidemia CAD status post PCI to left circumflex and LAD in the past History of bilateral renal artery stenting in the past Multiple myeloma GERD Hypokalemia Hypomagnesemia Plan As per orders  Charolette Forward, MD 10/08/2018, 7:42 AM

## 2018-10-08 NOTE — ED Notes (Signed)
Attempted IV stick x 2  

## 2018-10-08 NOTE — ED Notes (Signed)
Pt got up to bedside commode, HR jumped to 225, pt became very SOB and dizzy. After attempting several IV sticks pt returned to afib with a controlled rate.

## 2018-10-08 NOTE — Discharge Instructions (Addendum)
Atrial Fibrillation Atrial fibrillation is a type of irregular or rapid heartbeat (arrhythmia). In atrial fibrillation, the top part of the heart (atria) quivers in a chaotic pattern. This makes the heart unable to pump blood normally. Having atrial fibrillation can increase your risk for other health problems, such as:  Blood can pool in the atria and form clots. If a clot travels to the brain, it can cause a stroke.  The heart muscle may weaken from the irregular blood flow. This can cause heart failure. Atrial fibrillation may start suddenly and stop on its own, or it may become a long-lasting problem. What are the causes? This condition is caused by some heart-related conditions or procedures, including:  High blood pressure. This is the most common cause.  Heart failure.  Heart valve conditions.  Inflammation of the sac that surrounds the heart (pericarditis).  Heart surgery.  Coronary artery disease.  Certain heart rhythm disorders, such as Wolf-Parkinson-White syndrome. Other causes include:  Pneumonia.  Obstructive sleep apnea.  Lung cancer.  Thyroid problems, especially if the thyroid is overactive (hyperthyroidism).  Excessive alcohol or drug use. Sometimes, the cause of this condition is not known. What increases the risk? This condition is more likely to develop in:  Older people.  People who smoke.  People who have diabetes mellitus.  People who are overweight (obese).  Athletes who exercise vigorously.  People who have a family history. What are the signs or symptoms? Symptoms of this condition include:  A feeling that your heart is beating rapidly or irregularly.  A feeling of discomfort or pain in your chest.  Shortness of breath.  Sudden light-headedness or weakness.  Getting tired easily during exercise. In some cases, there are no symptoms. How is this diagnosed? Your health care provider may be able to detect atrial fibrillation when  taking your pulse. If detected, this condition may be diagnosed with:  Electrocardiogram (ECG).  Ambulatory cardiac monitor. This device records your heartbeats for 24 hours or more.  Transthoracic echocardiogram (TTE) to evaluate how blood flows through your heart.  Transesophageal echocardiogram (TEE) to view more detailed images of your heart.  A stress test.  Imaging tests, such as a CT scan or chest X-ray.  Blood tests. How is this treated? This condition may be treated with:  Medicines to slow down the heart rate or bring the heart's rhythm back to normal.  Medicines to prevent blood clots from forming.  Electrical cardioversion. This delivers a low-energy shock to the heart to reset its rhythm.  Ablation. This procedure destroys the part of the heart tissue that sends abnormal signals.  Left atrial appendage occlusion/excision. This seals off a common place in the atria where blood clots can form (left atrial appendage). The goal of treatment is to prevent blood clots from forming and to keep your heart beating at a normal rate and rhythm. Treatment depends on underlying medical conditions and how you feel when you are experiencing fibrillation. Follow these instructions at home: Medicines  Take over-the counter and prescription medicines only as told by your health care provider.  If your health care provider prescribed a blood-thinning medicine (anticoagulant), take it exactly as told. Taking too much blood-thinning medicine can cause bleeding. Taking too little can enable a blood clot to form and travel to the brain, causing a stroke. Lifestyle      Do not use any products that contain nicotine or tobacco, such as cigarettes and e-cigarettes. If you need help quitting, ask your health  care provider.  Do not drink beverages that contain caffeine, such as coffee, soda, and tea.  Follow diet instructions as told by your health care provider.  Exercise regularly as  told by your health care provider.  Do not drink alcohol. General instructions  If you have obstructive sleep apnea, manage your condition as told by your health care provider.  Maintain a healthy weight. Do not use diet pills unless your health care provider approves. Diet pills may make heart problems worse.  Keep all follow-up visits as told by your health care provider. This is important. Contact a health care provider if you:  Notice a change in the rate, rhythm, or strength of your heartbeat.  Are taking an anticoagulant and you notice increased bruising.  Tire more easily when you exercise or exert yourself.  Have a sudden change in weight. Get help right away if you have:   Chest pain, abdominal pain, sweating, or weakness.  Difficulty breathing.  Blood in your vomit, stool (feces), or urine.  Any symptoms of a stroke. "BE FAST" is an easy way to remember the main warning signs of a stroke: ? B - Balance. Signs are dizziness, sudden trouble walking, or loss of balance. ? E - Eyes. Signs are trouble seeing or a sudden change in vision. ? F - Face. Signs are sudden weakness or numbness of the face, or the face or eyelid drooping on one side. ? A - Arms. Signs are weakness or numbness in an arm. This happens suddenly and usually on one side of the body. ? S - Speech. Signs are sudden trouble speaking, slurred speech, or trouble understanding what people say. ? T - Time. Time to call emergency services. Write down what time symptoms started.  Other signs of a stroke, such as: ? A sudden, severe headache with no known cause. ? Nausea or vomiting. ? Seizure. These symptoms may represent a serious problem that is an emergency. Do not wait to see if the symptoms will go away. Get medical help right away. Call your local emergency services (911 in the [place] Do not drive yourself to the hospital. Summary  Atrial fibrillation is a type of irregular or rapid heartbeat  (arrhythmia).  Symptoms include a feeling that your heart is beating fast or irregularly. In some cases, you may not have symptoms.  The condition is treated with medicines to slow down the heart rate or bring the heart's rhythm back to normal. You may also need blood-thinning medicines to prevent blood clots.  Get help right away if you have symptoms or signs of a stroke. This information is not intended to replace advice given to you by your health care provider. Make sure you discuss any questions you have with your health care provider. Document Released: 10/06/2005 Document Revised: 11/27/2017 Document Reviewed: 11/27/2017 Elsevier Interactive Patient Education  2019 [place] on my medicine - ELIQUIS (apixaban)  This medication education was reviewed with me or my healthcare representative as part of my discharge preparation.   Why was Eliquis prescribed for you? Eliquis was prescribed for you to reduce the risk of a blood clot forming that can cause a stroke if you have a medical condition called atrial fibrillation (a type of irregular heartbeat).  What do You need to know about Eliquis ? Take your Eliquis TWICE DAILY - one tablet in the morning and one tablet in the evening with or without food. If you have difficulty swallowing the tablet whole please discuss  with your pharmacist how to take the medication safely.  Take Eliquis exactly as prescribed by your doctor and DO NOT stop taking Eliquis without talking to the doctor who prescribed the medication.  Stopping may increase your risk of developing a stroke.  Refill your prescription before you run out.  After discharge, you should have regular check-up appointments with your healthcare provider that is prescribing your Eliquis.  In the future your dose may need to be changed if your kidney function or weight changes by a significant amount or as you get older.  What do you do if you miss a dose? If you  miss a dose, take it as soon as you remember on the same day and resume taking twice daily.  Do not take more than one dose of ELIQUIS at the same time to make up a missed dose.  Important Safety Information A possible side effect of Eliquis is bleeding. You should call your healthcare provider right away if you experience any of the following: ? Bleeding from an injury or your nose that does not stop. ? Unusual colored urine (red or dark brown) or unusual colored stools (red or black). ? Unusual bruising for unknown reasons. ? A serious fall or if you hit your head (even if there is no bleeding).  Some medicines may interact with Eliquis and might increase your risk of bleeding or clotting while on Eliquis. To help avoid this, consult your healthcare provider or pharmacist prior to using any new prescription or non-prescription medications, including herbals, vitamins, non-steroidal anti-inflammatory drugs (NSAIDs) and supplements.  This website has more information on Eliquis (apixaban): http://www.eliquis.com/eliquis/home

## 2018-10-08 NOTE — Plan of Care (Signed)

## 2018-10-08 NOTE — ED Triage Notes (Signed)
Pt comes via [facility] from home after having palpations and vomiting, hx of afib, found in afib RVR, rate of 180, denies CP, some SOB and dizziness, pt converted en route to afib with PVC at a rate of 90

## 2018-10-08 NOTE — ED Provider Notes (Signed)
[facility] EMERGENCY DEPARTMENT Provider Note   CSN: [id number] Arrival date & time: 10/08/18  0308     History   Chief Complaint Chief Complaint  Patient presents with  . Atrial Fibrillation    HPI Abigail [name] is a 72 y.o. female.  The history is provided by the patient.  Atrial Fibrillation   She has history of coronary artery disease, hypertension, hyperlipidemia, atrial fibrillation anticoagulated on apixaban, chronic kidney disease and was brought in by ambulance because of recurrent atrial fibrillation.  She states she could feel her heart racing and pounding.  There was associated dyspnea and chest discomfort.  She had been vomiting at home.  She denies any diaphoresis.  EMS noted that heart rate would jump up to near 200 and then come back down.  She states she has been compliant with her medications.  Past Medical History:  Diagnosis Date  . ACS (acute coronary syndrome) ([place]) 06/2017  . Anemia    "off and on all my life" (12/22/2016)  . Anxiety   . Bilateral renal artery stenosis ([place]) 1999   s/p stenting  . Chronic kidney disease (CKD), stage III (moderate) ([place])    Archie Endo 12/22/2016  . Coronary artery disease 1999  . Depression   . Diverticulosis   . Duodenitis   . DVT (deep venous thrombosis) (H[facility])    on Coumadin  . Dyspnea   . Gastric erosions   . Gastric ulcer   . GERD (gastroesophageal reflux disease)   . Heart murmur   . History of blood transfusion ~ 03/2016   "low HgB; practically nonexistent"  . Hyperlipidemia   . Hypertension   . MI (myocardial infarction) ([place]) 1999  . Multiple myeloma ([place]) dx'd ~ 04/2016  . PAT (paroxysmal atrial tachycardia) ([place]) 12/22/2016  . Retinal hemorrhage, right eye   . Schatzki's ring   . Tachy-brady syndrome ([place])    Archie Endo 12/22/2016  . Tubular adenoma of colon     Patient Active Problem List   Diagnosis Date Noted  . Atrial fibrillation with RVR ([place]) 103-10-202019  . Precordial chest pain  09/15/2018  . Multiple myeloma in relapse ([place]) 06/15/2018  . Goals of care, counseling/discussion 06/15/2018  . Hematochezia   . Acute blood loss anemia   . Diverticulosis of colon with hemorrhage   . GIB (gastrointestinal bleeding) 09/03/2017  . Symptomatic anemia 09/03/2017  . Acute systolic heart failure ([place]) 09/03/2017  . Chronic kidney disease (CKD), stage III (moderate) ([place]) 09/03/2017  . Hyperlipidemia 09/03/2017  . Hypertension 09/03/2017  . Multiple myeloma in remission ([place]) 09/03/2017  . GI bleed 08/17/2017  . Sepsis ([place]) 08/17/2017  . Fever 08/16/2017  . STEMI (ST elevation myocardial infarction) ([place]) 02/24/2017  . Acute anterolateral wall MI ([place]) 02/24/2017  . Dehydration 01/21/2017  . Acute coronary syndrome ([place]) 12/24/2016  . Dizziness 12/22/2016    Class: Acute  . Orthostatic hypotension 12/08/2016  . Syncope and collapse 12/06/2016  . Tachycardia-bradycardia ([place])   . Essential hypertension   . Nausea and vomiting in adult patient   . Erosive esophagitis   . Protein-calorie malnutrition, severe 11/19/2016  . LLQ pain   . LUQ abdominal pain   . Intractable nausea and vomiting 11/17/2016  . Abdominal pain 11/05/2016  . Multiple myeloma ([place]) 06/18/2016  . Malnutrition of moderate degree 06/14/2016  . 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 Exertional dyspnea 11/01/2015  . Coronary atherosclerosis of native coronary artery 11/01/2015  .  Shortness of breath 11/01/2015  . Chest pain 12/31/2011  . Coronary artery disease 10/20/1997    Past Surgical History:  Procedure Laterality Date  . APPENDECTOMY    . BACK SURGERY    . CATARACT EXTRACTION W/ INTRAOCULAR LENS IMPLANT Right 2014  . COLONOSCOPY WITH PROPOFOL N/A 09/04/2017   Procedure: COLONOSCOPY WITH PROPOFOL;  Surgeon: Doran Stabler, MD;  Location: [facility];  Service: Gastroenterology;  Laterality: N/A;  . CORONARY ANGIOGRAPHY N/A 01/23/2017   Procedure:  Coronary Angiography;  Surgeon: [name], MD;  Location: [facility] CV LAB;  Service: Cardiovascular;  Laterality: N/A;  . CORONARY ANGIOPLASTY WITH STENT PLACEMENT  1999  . CORONARY STENT INTERVENTION N/A 12/29/2016   Procedure: Coronary Stent Intervention;  Surgeon: Charolette Forward, MD;  Location: [facility] LAB;  Service: Cardiovascular;  Laterality: N/A;  . CORONARY STENT INTERVENTION N/A 02/24/2017   Procedure: Coronary Stent Intervention;  Surgeon: Charolette Forward, MD;  Location: [facility] CV LAB;  Service: Cardiovascular;  Laterality: N/A;  . ESOPHAGOGASTRODUODENOSCOPY N/A 11/03/2015   Procedure: ESOPHAGOGASTRODUODENOSCOPY (EGD);  Surgeon: Carol Ada, MD;  Location: [facility] ENDOSCOPY;  Service: Endoscopy;  Laterality: N/A;  . ESOPHAGOGASTRODUODENOSCOPY N/A 09/03/2017   Procedure: ESOPHAGOGASTRODUODENOSCOPY (EGD);  Surgeon: Doran Stabler, MD;  Location: [facility];  Service: Gastroenterology;  Laterality: N/A;  . ESOPHAGOGASTRODUODENOSCOPY (EGD) WITH PROPOFOL N/A 11/18/2016   Procedure: ESOPHAGOGASTRODUODENOSCOPY (EGD) WITH PROPOFOL;  Surgeon: Ladene Artist, MD;  Location: [facility] ENDOSCOPY;  Service: Endoscopy;  Laterality: N/A;  . LEFT HEART CATH AND CORONARY ANGIOGRAPHY N/A 12/24/2016   Procedure: Left Heart Cath and Coronary Angiography;  Surgeon: [name], MD;  Location: [facility] LAB;  Service: Cardiovascular;  Laterality: N/A;  . LEFT HEART CATH AND CORONARY ANGIOGRAPHY N/A 02/24/2017   Procedure: Left Heart Cath and Coronary Angiography;  Surgeon: Charolette Forward, MD;  Location: [facility] CV LAB;  Service: Cardiovascular;  Laterality: N/A;  . LEFT HEART CATH AND CORONARY ANGIOGRAPHY N/A 03/05/2017   Procedure: Left Heart Cath and Coronary Angiography;  Surgeon: [name], MD;  Location: [facility] CV LAB;  Service: Cardiovascular;  Laterality: N/A;  . LEFT HEART CATH AND CORONARY ANGIOGRAPHY N/A 09/02/2018   Procedure: LEFT HEART CATH AND CORONARY ANGIOGRAPHY;  Surgeon:  [name], MD;  Location: [facility] CV LAB;  Service: Cardiovascular;  Laterality: N/A;  . LUMBAR [place]    . RENAL ARTERY STENT  1999   bil RAS so ? bil vs unilateral stents  . RETINAL LASER PROCEDURE Right 2012   "bleeding"  . TONSILLECTOMY    . TOTAL ABDOMINAL HYSTERECTOMY       OB History   No obstetric history on file.      Home Medications    Prior to Admission medications   Medication Sig Start Date End Date Taking? Authorizing Provider  acyclovir (ZOVIRAX) 400 MG tablet TAKE 1 TABLET (400 MG TOTAL) BY MOUTH 2 (TWO) TIMES DAILY. 08/29/16   Ladell Pier, MD  amiodarone (PACERONE) 100 MG tablet Take 1 tablet (100 mg total) by mouth daily. 10/04/18   Charolette Forward, MD  apixaban (ELIQUIS) 2.5 MG TABS tablet Take 1 tablet (2.5 mg total) by mouth 2 (two) times daily. 09/17/18   [name], MD  atorvastatin (LIPITOR) 80 MG tablet Take 0.5 tablets (40 mg total) by mouth daily at 6 PM. 10/03/18   Charolette Forward, MD  calcium carbonate (OS-CAL - DOSED IN MG OF ELEMENTAL CALCIUM) 1250 (500 Ca) MG tablet Take 1 tablet  by mouth daily with breakfast.    [provider]  cyclophosphamide (CYTOXAN) 50 MG capsule Take 10 capsules (517m) by mouth once weekly for 3 weeks on, 1 week off, repeat every 4 weeks. Take early in the day. Maintain hydration. 10/07/18   SLadell Pier MD  feeding supplement, ENSURE ENLIVE, (ENSURE ENLIVE) LIQD Take 237 mLs by mouth 2 (two) times daily between meals. Patient taking differently: Take 237 mLs by mouth See admin instructions. Drink one bottle (237 ml) by mouth one or two times daily as a meal supplement 09/17/18   KDixie Dials MD  ferrous sulfate 325 (65 FE) MG tablet Take 1 tablet (325 mg total) by mouth daily with breakfast. 03/02/17   KDixie Dials MD  HYDROcodone-acetaminophen (NORCO/VICODIN) 5-325 MG tablet Take 1 tablet by mouth 2 (two) times daily as needed for moderate pain. 10/07/18   SLadell Pier MD    nitroGLYCERIN (NITROSTAT) 0.4 MG SL tablet Place 0.4 mg under the tongue every 5 (five) minutes as needed for chest pain. 03/20/17   [provider]  ondansetron (ZOFRAN ODT) 8 MG disintegrating tablet Take 1 tablet (8 mg total) by mouth every 8 (eight) hours as needed for nausea or vomiting. 09/24/18   CJola Schmidt MD  pantoprazole (PROTONIX) 40 MG tablet Take 1 tablet (40 mg total) by mouth 2 (two) times daily before a meal. 09/17/18   KDixie Dials MD  Potassium Chloride ER (K-TAB) 20 MEQ TBCR Take 20 mEq by mouth daily. Resume today 10/07/18   SLadell Pier MD  prochlorperazine (COMPAZINE) 5 MG tablet Take 1 tablet (5 mg total) by mouth every 6 (six) hours as needed for nausea or vomiting. Patient not taking: Reported on 10/07/2018 09/24/18   SLadell Pier MD  pyridostigmine (MESTINON) 60 MG tablet Take 0.5 tablets (30 mg total) by mouth 2 (two) times daily. 01/25/17   KDixie Dials MD  tiZANidine (ZANAFLEX) 4 MG tablet Take 4 mg by mouth at bedtime as needed for muscle spasms.  04/06/18   [provider]  ISOSORBIDE DINITRATE PO Take by mouth.  12/30/11  [provider]    Family History Family History  Problem Relation Age of Onset  . Breast cancer Mother   . Breast cancer Sister   . Breast cancer Maternal Aunt   . Colon cancer Neg Hx     Social History Social History   Tobacco Use  . Smoking status: Current Every Day Smoker    Packs/day: 0.50    Years: 50.00    Pack years: 25.00    Types: Cigarettes  . Smokeless tobacco: Never Used  Substance Use Topics  . Alcohol use: No    Alcohol/week: 0.0 standard drinks  . Drug use: No     Allergies   Sulfa antibiotics   Review of Systems Review of Systems  All other systems reviewed and are negative.    Physical Exam Updated Vital Signs BP (!) 163/82   Pulse (!) 112   Temp 98.7 F (37.1 C) (Oral)   Resp (!) 26   SpO2 100%   Physical Exam Vitals signs and nursing note reviewed.     72year old female, resting comfortably and in no acute distress. Vital signs are significant for rapid heart rate, rapid respiratory rate, elevated systolic blood pressure. Oxygen saturation is 100%, which is normal. Head is normocephalic and atraumatic. PERRLA, EOMI. Oropharynx is clear. Neck is nontender and supple without adenopathy or JVD. Back is nontender and there  is no CVA tenderness. Lungs are clear without rales, wheezes, or rhonchi. Chest is nontender. Heart is tachycardic and irregular without murmur. Abdomen is soft, flat, nontender without masses or hepatosplenomegaly and peristalsis is normoactive. Extremities have no cyanosis or edema, full range of motion is present. Skin is warm and dry without rash. Neurologic: Mental status is normal, cranial nerves are intact, there are no motor or sensory deficits.  ED Treatments / Results  Labs (all labs ordered are listed, but only abnormal results are displayed) Labs Reviewed  BASIC METABOLIC PANEL - Abnormal; Notable for the following components:      Result Value   Potassium 2.7 (*)    CO2 21 (*)    Glucose, Bld 114 (*)    Creatinine, Ser 1.32 (*)    GFR calc non Af Amer 40 (*)    GFR calc Af Amer 47 (*)    Anion gap 16 (*)    All other components within normal limits  CBC WITH DIFFERENTIAL/PLATELET - Abnormal; Notable for the following components:   RBC 3.47 (*)    Hemoglobin 9.8 (*)    HCT 32.1 (*)    RDW 19.1 (*)    Lymphs Abs 0.4 (*)    Abs Immature Granulocytes 0.20 (*)    All other components within normal limits  MAGNESIUM - Abnormal; Notable for the following components:   Magnesium 1.5 (*)    All other components within normal limits  I-STAT TROPONIN, ED - Abnormal; Notable for the following components:   Troponin i, poc 0.28 (*)    All other components within normal limits    EKG EKG Interpretation  Date/Time:  Friday October 08 2018 03:15:51 EST Ventricular Rate:  80 PR Interval:    QRS  Duration: 96 QT Interval:  376 QTC Calculation: 434 R Axis:   37 Text Interpretation:  Atrial fibrillation Multiple premature complexes, vent & supraven Left ventricular hypertrophy Anterior Q waves, possibly due to LVH Nonspecific T abnormalities, lateral leads When compared with ECG of 10/03/2018, Atrial fibrillation has replaced Sinus rhythm Premature ventricular complexes are now present T wave abnormality is less evident Confirmed by Delora Fuel ([id number]) on 10/08/2018 3:27:27 AM   EKG Interpretation  Date/Time:  Friday October 08 2018 03:42:46 EST Ventricular Rate:  223 PR Interval:    QRS Duration: 153 QT Interval:  255 QTC Calculation: 492 R Axis:   -109 Text Interpretation:  Extreme tachycardia with wide complex, no further rhythm analysis attempted Artifact in lead(s) I II aVR When compared with ECG of EARLIER SAME DATE Wide QRS tachycardia is now present concerning for possible  Ventricular tachycardia Confirmed by Delora Fuel ([id number]) on 10/08/2018 6:49:46 AM       EKG Interpretation  Date/Time:  Friday October 08 2018 06:08:08 EST Ventricular Rate:  65 PR Interval:    QRS Duration: 100 QT Interval:  401 QTC Calculation: 417 R Axis:   41 Text Interpretation:  Sinus rhythm LVH with secondary repolarization abnormality Anterior Q waves, possibly due to LVH When compared with ECG of EARLIER SAME DATE Sinus rhythm has replaced Atrial fibrillation and  Wide QRS tachycardia Confirmed by Delora Fuel ([id number]) on 10/08/2018 6:53:11 AM      Radiology Dg Chest Port 1 View  Result Date: 10/08/2018 CLINICAL DATA:  Shortness of breath, palpitations, and vomiting since yesterday. Chest pain. Current smoker. EXAM: PORTABLE CHEST 1 VIEW COMPARISON:  101-05-202019 FINDINGS: Normal heart size and pulmonary vascularity. No focal airspace disease or consolidation in the lungs.  No blunting of costophrenic angles. No pneumothorax. Mediastinal contours appear intact. Calcified and mildly  tortuous aorta. IMPRESSION: No active disease. Electronically Signed   By: Lucienne Capers M.D.   On: 10/08/2018 04:45    Procedures Procedures  CRITICAL CARE Performed by: Delora Fuel Total critical care time: 105 minutes Critical care time was exclusive of separately billable procedures and treating other patients. Critical care was necessary to treat or prevent imminent or life-threatening deterioration. Critical care was time spent personally by me on the following activities: development of treatment plan with patient and/or surrogate as well as nursing, discussions with consultants, evaluation of patient's response to treatment, examination of patient, obtaining history from patient or surrogate, ordering and performing treatments and interventions, ordering and review of laboratory studies, ordering and review of radiographic studies, pulse oximetry and re-evaluation of patient's condition.  Medications Ordered in ED Medications  amiodarone (NEXTERONE PREMIX) 360-4.14 MG/200ML-% (1.8 mg/mL) IV infusion (60 mg/hr Intravenous New Bag/Given 10/08/18 0407)  amiodarone (NEXTERONE PREMIX) 360-4.14 MG/200ML-% (1.8 mg/mL) IV infusion (has no administration in time range)  potassium chloride 10 mEq in 100 mL IVPB (10 mEq Intravenous New Bag/Given 10/08/18 0650)  amiodarone (NEXTERONE) IV bolus only 150 mg/100 mL (150 mg Intravenous New Bag/Given 10/08/18 0404)  magnesium sulfate IVPB 2 g 50 mL (0 g Intravenous Stopped 10/08/18 0649)    Initial Impression / Assessment and Plan / ED Course  I have reviewed the triage vital signs and the nursing notes.  Pertinent labs & imaging results that were available during my care of the patient were reviewed by me and considered in my medical decision making (see chart for details).  Tachyarrhythmia and patient with known history of atrial fibrillation.  Runs of tachycardia around 200 bpm with different axis concerning for ventricular tachycardia versus  atrial flutter with aberrant conduction.  She is given additional amiodarone intravenously and started on amiodarone drip.  Old records are reviewed, and she has multiple recent ED visits for atrial fibrillation as well as non-STEMI.  Recent cardiac catheterization showed she has patent coronary stents.  Troponin has come back mildly elevated and this is felt to be secondary to demand ischemia.  Anemia is present and unchanged from baseline.  Mild renal insufficiency is present and is also unchanged from baseline.  She is noted to be severely hypokalemic and also mildly hypomagnesemic.  Intravenous potassium and magnesium are given.  Before the potassium and magnesium were given, she did convert to sinus rhythm.  I certainly feel that the electrolyte disturbance is a major cause of her arrhythmia today.  Case is discussed with Dr. [name], on-call for Dr. Doylene Canard, who states he will come to evaluate the patient for admission.  Final Clinical Impressions(s) / ED Diagnoses   Final diagnoses:  Atrial fibrillation with rapid ventricular response ([place])  Hypokalemia  Hypomagnesemia  Renal insufficiency    ED Discharge Orders    None       Delora Fuel, MD 91/50/56 0700

## 2018-10-09 LAB — BASIC METABOLIC PANEL
Anion gap: 10 (ref 5–15)
BUN: 9 mg/dL (ref 8–23)
CO2: 21 mmol/L — AB (ref 22–32)
Calcium: 8.4 mg/dL — ABNORMAL LOW (ref 8.9–10.3)
Chloride: 109 mmol/L (ref 98–111)
Creatinine, Ser: 1.26 mg/dL — ABNORMAL HIGH (ref 0.44–1.00)
GFR calc Af Amer: 50 mL/min — ABNORMAL LOW (ref >60)
GFR calc non Af Amer: 43 mL/min — ABNORMAL LOW (ref >60)
Glucose, Bld: 98 mg/dL (ref 70–99)
Potassium: 3.5 mmol/L (ref 3.5–5.1)
Sodium: 140 mmol/L (ref 135–145)

## 2018-10-09 MED ORDER — AMIODARONE HCL 200 MG PO TABS
200.0000 mg | ORAL_TABLET | Freq: Two times a day (BID) | ORAL | Status: DC
  Administered 2018-10-09 – 2018-10-10 (×3): 200 mg via ORAL
  Filled 2018-10-09 (×3): qty 1

## 2018-10-09 MED ORDER — POTASSIUM CHLORIDE CRYS ER 20 MEQ PO TBCR
40.0000 meq | EXTENDED_RELEASE_TABLET | Freq: Once | ORAL | Status: AC
  Administered 2018-10-09: 40 meq via ORAL
  Filled 2018-10-09: qty 2

## 2018-10-09 NOTE — Progress Notes (Signed)
Subjective:  Patient denies any chest pain or shortness of breath.  Denies further palpitations converted to sinus rhythm.  States feels weak tired nauseous and vomited x3 this morning.  Do not feel strong enough to go home today.  Objective:  Vital Signs in the last 24 hours: Temp:  [98.4 F (36.9 C)-98.6 F (37 C)] 98.6 F (37 C) (12/21 0628) Pulse Rate:  [53-60] 60 (12/21 0628) Resp:  [13-15] 13 (12/20 2205) BP: (121-148)/(61-99) 135/78 (12/21 0628) SpO2:  [95 %-99 %] 98 % (12/21 0628) Weight:  [70.2 kg] 70.2 kg (12/21 0628)  Intake/Output from previous day: 12/20 0701 - 12/21 0700 In: 1859.8 [P.O.:120; I.V.:1539.8; IV Piggyback:200] Out: 550 [Urine:550] Intake/Output from this shift: No intake/output data recorded.  Physical Exam: Neck: no adenopathy, no carotid bruit, no JVD and supple, symmetrical, trachea midline Lungs: clear to auscultation bilaterally Heart: regular rate and rhythm, S1, S2 normal and 2/6 systolic murmur noted Abdomen: soft, non-tender; bowel sounds normal; no masses,  no organomegaly Extremities: extremities normal, atraumatic, no cyanosis or edema  Lab Results: Recent Labs    10/07/18 0823 10/08/18 0422  WBC 5.3 7.4  HGB 9.6* 9.8*  PLT 269 301   Recent Labs    10/08/18 1654 10/09/18 0323  NA 141 140  K 4.0 3.5  CL 109 109  CO2 22 21*  GLUCOSE 91 98  BUN 9 9  CREATININE 1.22* 1.26*   No results for input(s): TROPONINI in the last 72 hours.  Invalid input(s): CK, MB Hepatic Function Panel Recent Labs    10/07/18 0823  PROT 8.5*  ALBUMIN 4.0  AST 15  ALT 7  ALKPHOS 77  BILITOT 1.1   No results for input(s): CHOL in the last 72 hours. No results for input(s): PROTIME in the last 72 hours.  Imaging: Imaging results have been reviewed and Dg Chest Port 1 View  Result Date: 10/08/2018 CLINICAL DATA:  Shortness of breath, palpitations, and vomiting since yesterday. Chest pain. Current smoker. EXAM: PORTABLE CHEST 1 VIEW  COMPARISON:  104/16/202019 FINDINGS: Normal heart size and pulmonary vascularity. No focal airspace disease or consolidation in the lungs. No blunting of costophrenic angles. No pneumothorax. Mediastinal contours appear intact. Calcified and mildly tortuous aorta. IMPRESSION: No active disease. Electronically Signed   By: Lucienne Capers M.D.   On: 10/08/2018 04:45    Cardiac Studies:  Assessment/Plan:  Status post recurrent SVT/A. fib with RVR chads vas score of 5 on chronic anticoagulation Minimally elevated troponin secondary to demand ischemia secondary to above doubt significant MI History of SVT and multifocal atrial tachycardia in the past Hypertension Hyperlipidemia CAD status post PCI to left circumflex and LAD in the past History of bilateral renal artery stenting in the past Multiple myeloma status post chemotherapy GERD Status post hypokalemia Status post hypomagnesemia Plan DC IV amiodarone once present back is finished and start on p.o. 200 mg twice daily for 1 week and then 200 mg daily Start K. Dur as per orders Increase ambulation as tolerated We will monitor 1 more day if no further arrhythmias will discharge in a.m.  LOS: 0 days    Charolette Forward 10/09/2018, 10:45 AM

## 2018-10-09 NOTE — Care Management Obs Status (Signed)
MEDICARE OBSERVATION STATUS NOTIFICATION   Patient Details  Name: KEATYN LUCK MRN: [id number] Date of Birth: 03/08/1946   Medicare Observation Status Notification Given:  Yes(refused to sign at this time)    Royston Bake, RN 10/09/2018, 1:13 PM

## 2018-10-10 ENCOUNTER — Other Ambulatory Visit: Payer: Self-pay | Admitting: Oncology

## 2018-10-10 DIAGNOSIS — R7989 Other specified abnormal findings of blood chemistry: Secondary | ICD-10-CM | POA: Diagnosis not present

## 2018-10-10 DIAGNOSIS — I471 Supraventricular tachycardia: Secondary | ICD-10-CM | POA: Diagnosis not present

## 2018-10-10 DIAGNOSIS — E785 Hyperlipidemia, unspecified: Secondary | ICD-10-CM | POA: Diagnosis not present

## 2018-10-10 DIAGNOSIS — I4891 Unspecified atrial fibrillation: Secondary | ICD-10-CM | POA: Diagnosis not present

## 2018-10-10 MED ORDER — OFF THE BEAT BOOK
Freq: Once | Status: DC
  Filled 2018-10-10: qty 1

## 2018-10-10 MED ORDER — METOPROLOL SUCCINATE ER 25 MG PO TB24
12.5000 mg | ORAL_TABLET | Freq: Every day | ORAL | Status: DC
  Administered 2018-10-10: 12.5 mg via ORAL
  Filled 2018-10-10 (×2): qty 1

## 2018-10-10 MED ORDER — POTASSIUM CHLORIDE CRYS ER 20 MEQ PO TBCR
40.0000 meq | EXTENDED_RELEASE_TABLET | Freq: Once | ORAL | Status: AC
  Administered 2018-10-10: 40 meq via ORAL

## 2018-10-10 NOTE — Plan of Care (Signed)
Care plan updated.

## 2018-10-10 NOTE — Progress Notes (Signed)
Pt very withdrawn today, prefers lights out, door closed, and head under covers. Pt was coaxed to take walk this afternoon inhallway with RN. Pt tearful stating she is scared of whats wrong with her and tired of "all this". Emotional support given. Dr. [name] did not feel patient needs palliative consult. Encouraged patient to talk to resources available through oncologist. Ellamae Sia

## 2018-10-10 NOTE — Progress Notes (Signed)
Subjective:  Denies any chest pain or shortness of breath.  Denies abdominal pain nausea vomiting.  Noted to have marked sinus bradycardia and prolonged QTC.  On amiodarone.  Objective:  Vital Signs in the last 24 hours: Temp:  [98.4 F (36.9 C)-98.7 F (37.1 C)] 98.6 F (37 C) (12/22 0628) Pulse Rate:  [40-64] 51 (12/22 0628) Resp:  [14-22] 14 (12/22 0331) BP: (112-163)/(65-76) 163/75 (12/22 0628) SpO2:  [93 %-100 %] 100 % (12/22 0628) Weight:  [71 kg-71.1 kg] 71.1 kg (12/22 0628)  Intake/Output from previous day: 12/21 0701 - 12/22 0700 In: 1923.9 [P.O.:480; I.V.:1443.9] Out: 200 [Urine:200] Intake/Output from this shift: Total I/O In: 496.6 [P.O.:360; I.V.:136.6] Out: 100 [Urine:100]  Physical Exam: Neck: no adenopathy, no carotid bruit, no JVD and supple, symmetrical, trachea midline Lungs: clear to auscultation bilaterally Heart: regular rate and rhythm, S1, S2 normal and 2/6 systolic murmur noted Abdomen: soft, non-tender; bowel sounds normal; no masses,  no organomegaly Extremities: extremities normal, atraumatic, no cyanosis or edema  Lab Results: Recent Labs    10/08/18 0422  WBC 7.4  HGB 9.8*  PLT 301   Recent Labs    10/08/18 1654 10/09/18 0323  NA 141 140  K 4.0 3.5  CL 109 109  CO2 22 21*  GLUCOSE 91 98  BUN 9 9  CREATININE 1.22* 1.26*   No results for input(s): TROPONINI in the last 72 hours.  Invalid input(s): CK, MB Hepatic Function Panel No results for input(s): PROT, ALBUMIN, AST, ALT, ALKPHOS, BILITOT, BILIDIR, IBILI in the last 72 hours. No results for input(s): CHOL in the last 72 hours. No results for input(s): PROTIME in the last 72 hours.  Imaging: Imaging results have been reviewed and No results found.  Cardiac Studies:  Assessment/Plan:  Status post recurrentSVT/A. fib with RVR chads vas score of 5 on chronic anticoagulation Minimally elevated troponin secondary to demand ischemia secondary to above doubt significant  MI History of SVT and multifocal atrial tachycardia in the past Hypertension Hyperlipidemia CAD status post PCI to left circumflex and LAD in the past History of bilateral renal artery stenting in the past Multiple myeloma status post chemotherapy GERD Status post hypokalemia Status post hypomagnesemia Markedly prolonged QTC Plan Hold amiodarone today Add low-dose beta-blockers as per orders K. Dur 40 mEq p.o. now keep potassium around 4 Check EKG in a.m. possible discharge tomorrow if stable on low-dose beta-blockers and low-dose amiodarone  LOS: 0 days    Charolette Forward 10/10/2018, 1:01 PM

## 2018-10-10 NOTE — Plan of Care (Signed)
  Problem: Clinical Measurements: Goal: Respiratory complications will improve Outcome: Progressing Note:  No s/s of respiratory complications noted. Goal: Cardiovascular complication will be avoided Outcome: Progressing Note:  NSR/SB on telemetry.  No s/s of cardiovascular complication.   Problem: Education: Goal: Knowledge of disease or condition will improve Outcome: Progressing Note:  Discussed a-fib with patient and answered her questions.  Off the Beat book ordered.

## 2018-10-10 NOTE — Plan of Care (Signed)
PT CARE PLAN UPDATED

## 2018-10-10 NOTE — Plan of Care (Signed)
  Problem: Clinical Measurements: Goal: Respiratory complications will improve Outcome: Progressing Note:  No s/s of respiratory complications noted. Goal: Cardiovascular complication will be avoided Outcome: Progressing Note:  NSR/SB on telemetry.  No s/s of cardiovascular complication.

## 2018-10-11 ENCOUNTER — Other Ambulatory Visit: Payer: Self-pay

## 2018-10-11 DIAGNOSIS — I471 Supraventricular tachycardia: Secondary | ICD-10-CM | POA: Diagnosis not present

## 2018-10-11 DIAGNOSIS — E785 Hyperlipidemia, unspecified: Secondary | ICD-10-CM | POA: Diagnosis not present

## 2018-10-11 DIAGNOSIS — I4891 Unspecified atrial fibrillation: Secondary | ICD-10-CM | POA: Diagnosis not present

## 2018-10-11 DIAGNOSIS — R7989 Other specified abnormal findings of blood chemistry: Secondary | ICD-10-CM | POA: Diagnosis not present

## 2018-10-11 LAB — MAGNESIUM: MAGNESIUM: 1.7 mg/dL (ref 1.7–2.4)

## 2018-10-11 LAB — PROTEIN ELECTROPHORESIS, SERUM
A/G RATIO SPE: 1 (ref 0.7–1.7)
Albumin ELP: 3.9 g/dL (ref 2.9–4.4)
Alpha-1-Globulin: 0.4 g/dL (ref 0.0–0.4)
Alpha-2-Globulin: 1 g/dL (ref 0.4–1.0)
Beta Globulin: 0.8 g/dL (ref 0.7–1.3)
Gamma Globulin: 1.8 g/dL (ref 0.4–1.8)
Globulin, Total: 3.9 g/dL (ref 2.2–3.9)
M-Spike, %: 1.5 g/dL — ABNORMAL HIGH
Total Protein ELP: 7.8 g/dL (ref 6.0–8.5)

## 2018-10-11 LAB — BASIC METABOLIC PANEL
Anion gap: 6 (ref 5–15)
BUN: 10 mg/dL (ref 8–23)
CO2: 20 mmol/L — ABNORMAL LOW (ref 22–32)
CREATININE: 1.22 mg/dL — AB (ref 0.44–1.00)
Calcium: 8.4 mg/dL — ABNORMAL LOW (ref 8.9–10.3)
Chloride: 114 mmol/L — ABNORMAL HIGH (ref 98–111)
GFR calc Af Amer: 52 mL/min — ABNORMAL LOW (ref >60)
GFR calc non Af Amer: 45 mL/min — ABNORMAL LOW (ref >60)
Glucose, Bld: 96 mg/dL (ref 70–99)
Potassium: 4.2 mmol/L (ref 3.5–5.1)
SODIUM: 140 mmol/L (ref 135–145)

## 2018-10-11 LAB — GLUCOSE, CAPILLARY: Glucose-Capillary: 108 mg/dL — ABNORMAL HIGH (ref 70–99)

## 2018-10-11 MED ORDER — AMIODARONE HCL 200 MG PO TABS: 200.0000 mg | ORAL_TABLET | Freq: Every day | ORAL | 3 refills | Status: DC

## 2018-10-11 NOTE — Discharge Summary (Signed)
Discharge summary dictated on 10/11/2018, dictation number is [phone]

## 2018-10-11 NOTE — Progress Notes (Signed)
Patient's heart rhythm has been bradycardic while patient has been sleeping.  Heart rate as low as 37, primarily in the lower 40's.  EKG done and in patient's chart.  Per patient feeling weak and lightheaded while laying in bed.  Dr. [name] paged and was updated of this information, orders received and entered per RN.

## 2018-10-11 NOTE — Discharge Summary (Signed)
NAME: Abigail [name], Abigail [name] MEDICAL RECORD WU:[id number] ACCOUNT [id number] DATE OF BIRTH:October 04, 1946 FACILITY: [facility] LOCATION: [id number] PHYSICIAN:Jourdan Durbin Daivd Council, MD  DISCHARGE SUMMARY  DATE OF DISCHARGE:  10/11/2018  ADMITTING DIAGNOSES: 1.  Recurrent supraventricular tachycardia /atrial fibrillation with rapid ventricular response, CHADS-VASc score of 5, on chronic anticoagulation. 2.  Minimally elevated troponin I secondary to demand ischemia secondary to above.  Doubt significant myocardial infarction. 3.  History of supraventricular tachycardia  and multifocal atrial tachycardia in the past. 4.  Hypertension. 5.  Hyperlipidemia. 6.  Coronary artery disease, status post percutaneous coronary intervention to left circumflex and left anterior descending in the past. 7.  History of bilateral renal artery stenting in the past. 8.  Multiple myeloma, status post recent chemotherapy. 9.  Gastroesophageal reflux disease. 10.  Hypokalemia. 11.  Hypomagnesemia.  FINAL DIAGNOSES: 1.  Status post recurrent supraventricular tachycardia with atrial fibrillation with rapid ventricular response, CHADS-VASc score of 5, on chronic anticoagulation.  Minimally elevated troponin I secondary to demand ischemia. 2.  History of supraventricular tachycardia  and multifocal atrial tachycardia in the past. 3.  Status post marked sinus bradycardia and marked prolonged QTc interval secondary to intravenous amiodarone, which has been switched to dose p.o. now. 4.  Hypertension. 5.  Hyperlipidemia.   6.  Coronary artery disease, status post percutaneous coronary intervention to left circumflex and left anterior descending in the past. 7.  History of bilateral renal artery stenting in the past. 8.  Multiple myeloma. 9.  Gastroesophageal reflux disease. 10.  Status post hypokalemia and hypomagnesemia.  DISCHARGE HOME MEDICATIONS: 1.  Acyclovir 400 mg twice daily as before. 2.  Eliquis 2.5 mg twice daily. 3.   Atorvastatin 80 mg 1/2 tablet daily. 4.  Calcium carbonate 1250 mg daily with breakfast. 5.  Cytoxan 10 capsules, which is 500 mg by mouth once weekly for 3 weeks on and 1 week off as per oncology.   6.  Ferrous sulfate 325 mg daily. 7.  Norco 1 tablet 2 times daily as needed for moderate pain. 8.  K-Dur 20 mEq one tablet daily. 9.  Nitrostat sublingual p.r.n. as before. 10.  Zofran 8 mg 1 tablet every 8 hours as needed. 11.  Protonix 40 mg daily. 12.  Mestinon 0.5 tablet, half tablet total 30 mg by mouth 2 times daily. 13.  Zanaflex 4 mg by mouth at bedtime as needed for muscle spasm.   14.  Amiodarone 200 mg daily. 15.  Feeding supplement twice daily as before as tolerated.  DIET:  Low salt, low cholesterol.  ACTIVITY:  Increase activity slowly as tolerated.    FOLLOWUP:  Follow up with Dr. Cammy Brochure in 1 week.  Follow up with oncology as scheduled.  BRIEF HISTORY AND HOSPITAL COURSE:  The patient is a 72 year old female with past medical history significant for multiple medical problems, i.e., coronary artery disease, history of myocardial infarction in the remote past, status post percutaneous  coronary intervention to left circumflex and left anterior descending in the past, history of bilateral renal artery stenosis, status post stenting in 2005, hypertension, hyperlipidemia, GERD, history of multiple myeloma, anemia of chronic disease,  chronic kidney disease stage III, history of paroxysmal AFib/SVT in the past.  Received chemotherapy yesterday.  Came to the ER complaining of palpitations associated with nausea, vomiting since this early morning.  The patient denies any chest pain,  denies shortness of breath.  States received chemotherapy yesterday for multiple myeloma.  The patient was initially noted to have SVT.  Subsequently converted  atrial fibrillation and then to sinus rhythm after receiving IV amiodarone bolus and was  started on drip in the ED.  The patient was also noted to  have marked hypokalemia and hypomagnesemia.  The patient was recently discharged from the hospital.  Admitted with AFib with RVR, subsequently converted to sinus rhythm with IV amiodarone and was  switched to p.o. and was discharged home.  The patient was noted to have minimally elevated troponin I.  EKG showed no acute ischemic changes.  The patient recently had cardiac catheterization, noted to have patent stents.  The patient has been evaluated  in the past for SVT and atrial tachycardia by EP and was felt not a candidate for invasive ablation.  PHYSICAL EXAMINATION: GENERAL:  She was alert, awake, oriented x3. VITAL SIGNS:  Blood pressure was 137/85, pulse 60. HEENT:  Conjunctivae pink. NECK:  Supple, no JVD, no bruits. LUNGS:  Clear to auscultation without rhonchi or rales. CARDIOVASCULAR:  S1, S2 was normal.  There was II/VI systolic murmur.  No S3 gallop. ABDOMEN:  Soft.  Bowel sounds are present, nontender. EXTREMITIES:  There is no clubbing, cyanosis or edema.  LABORATORY DATA:  Sodium was 139, potassium 2.7, BUN was 9, creatinine 1.32.  Magnesium was also low at 1.5.  Repeat magnesium was 2.3.  Hemoglobin was 9.8, hematocrit 32.1, white count of 7.4, platelet count was 301,000.  Last electrolytes sodium 140,  potassium 4.2, BUN 10, creatinine 1.2, magnesium is 1.7, hemoglobin is 9.8, hematocrit 32.1, white count of 7.4, platelet count 301,000.  BRIEF HOSPITAL COURSE:  The patient was admitted to Telemetry Unit.  The patient was started on IV amiodarone, which was switched to p.o.  The patient remained in sinus rhythm during the hospital stay.  Her QTc interval was markedly prolonged.   Amiodarone was held yesterday.  QTc is back to normal range.  Amiodarone will be restarted at lower dose to 200 mg daily.  The patient has been ambulating and has ambulated in the room without any problems with no further episodes of SVT or fibrillation  -= the monitor.  The patient will be discharged home  on above medications and will be followed up by Dr. Doylene Canard in 1 week and oncology as scheduled as outpatient.  AN/NUANCE D:10/11/2018 T:10/11/2018 JOB:[id number]

## 2018-10-12 ENCOUNTER — Telehealth: Payer: Self-pay | Admitting: Oncology

## 2018-10-12 NOTE — Telephone Encounter (Signed)
Scheduled appt per 12/19 los - unable to reach patient to let them know next set of appts decoupled - no vmail .  Sent reminder letter in the mail with appt date and time

## 2018-10-15 ENCOUNTER — Encounter (HOSPITAL_COMMUNITY): Payer: Self-pay

## 2018-10-15 ENCOUNTER — Observation Stay (HOSPITAL_COMMUNITY)
Admission: AD | Admit: 2018-10-15 | Discharge: 2018-10-19 | Disposition: A | Discharge status: Home / Self Care | Payer: Medicare HMO | Source: Ambulatory Visit | Attending: Cardiovascular Disease | Admitting: Cardiovascular Disease

## 2018-10-15 ENCOUNTER — Other Ambulatory Visit: Payer: Self-pay

## 2018-10-15 DIAGNOSIS — Z90710 Acquired absence of both cervix and uterus: Secondary | ICD-10-CM

## 2018-10-15 DIAGNOSIS — N183 Chronic kidney disease, stage 3 (moderate): Secondary | ICD-10-CM

## 2018-10-15 DIAGNOSIS — Z79899 Other long term (current) drug therapy: Secondary | ICD-10-CM

## 2018-10-15 DIAGNOSIS — Z881 Allergy status to other antibiotic agents status: Secondary | ICD-10-CM

## 2018-10-15 DIAGNOSIS — Z7901 Long term (current) use of anticoagulants: Secondary | ICD-10-CM | POA: Insufficient documentation

## 2018-10-15 DIAGNOSIS — Z882 Allergy status to sulfonamides status: Secondary | ICD-10-CM

## 2018-10-15 DIAGNOSIS — Z9089 Acquired absence of other organs: Secondary | ICD-10-CM

## 2018-10-15 DIAGNOSIS — C9002 Multiple myeloma in relapse: Secondary | ICD-10-CM | POA: Diagnosis present

## 2018-10-15 DIAGNOSIS — I129 Hypertensive chronic kidney disease with stage 1 through stage 4 chronic kidney disease, or unspecified chronic kidney disease: Secondary | ICD-10-CM

## 2018-10-15 DIAGNOSIS — F419 Anxiety disorder, unspecified: Secondary | ICD-10-CM

## 2018-10-15 DIAGNOSIS — Z86718 Personal history of other venous thrombosis and embolism: Secondary | ICD-10-CM

## 2018-10-15 DIAGNOSIS — R634 Abnormal weight loss: Secondary | ICD-10-CM

## 2018-10-15 DIAGNOSIS — Z9221 Personal history of antineoplastic chemotherapy: Secondary | ICD-10-CM | POA: Insufficient documentation

## 2018-10-15 DIAGNOSIS — E785 Hyperlipidemia, unspecified: Secondary | ICD-10-CM | POA: Insufficient documentation

## 2018-10-15 DIAGNOSIS — R6521 Severe sepsis with septic shock: Secondary | ICD-10-CM | POA: Diagnosis present

## 2018-10-15 DIAGNOSIS — K219 Gastro-esophageal reflux disease without esophagitis: Secondary | ICD-10-CM

## 2018-10-15 DIAGNOSIS — I081 Rheumatic disorders of both mitral and tricuspid valves: Secondary | ICD-10-CM

## 2018-10-15 DIAGNOSIS — D72819 Decreased white blood cell count, unspecified: Secondary | ICD-10-CM

## 2018-10-15 DIAGNOSIS — I482 Chronic atrial fibrillation, unspecified: Secondary | ICD-10-CM | POA: Insufficient documentation

## 2018-10-15 DIAGNOSIS — G7000 Myasthenia gravis without (acute) exacerbation: Secondary | ICD-10-CM | POA: Insufficient documentation

## 2018-10-15 DIAGNOSIS — R9431 Abnormal electrocardiogram [ECG] [EKG]: Secondary | ICD-10-CM | POA: Insufficient documentation

## 2018-10-15 DIAGNOSIS — E875 Hyperkalemia: Secondary | ICD-10-CM | POA: Diagnosis present

## 2018-10-15 DIAGNOSIS — I2510 Atherosclerotic heart disease of native coronary artery without angina pectoris: Secondary | ICD-10-CM | POA: Insufficient documentation

## 2018-10-15 DIAGNOSIS — K2950 Unspecified chronic gastritis without bleeding: Secondary | ICD-10-CM

## 2018-10-15 DIAGNOSIS — I130 Hypertensive heart and chronic kidney disease with heart failure and stage 1 through stage 4 chronic kidney disease, or unspecified chronic kidney disease: Secondary | ICD-10-CM | POA: Diagnosis present

## 2018-10-15 DIAGNOSIS — F329 Major depressive disorder, single episode, unspecified: Secondary | ICD-10-CM | POA: Insufficient documentation

## 2018-10-15 DIAGNOSIS — Z6825 Body mass index (BMI) 25.0-25.9, adult: Secondary | ICD-10-CM

## 2018-10-15 DIAGNOSIS — M6281 Muscle weakness (generalized): Secondary | ICD-10-CM | POA: Insufficient documentation

## 2018-10-15 DIAGNOSIS — Z955 Presence of coronary angioplasty implant and graft: Secondary | ICD-10-CM

## 2018-10-15 DIAGNOSIS — Z79891 Long term (current) use of opiate analgesic: Secondary | ICD-10-CM

## 2018-10-15 DIAGNOSIS — T451X5A Adverse effect of antineoplastic and immunosuppressive drugs, initial encounter: Secondary | ICD-10-CM | POA: Insufficient documentation

## 2018-10-15 DIAGNOSIS — J9600 Acute respiratory failure, unspecified whether with hypoxia or hypercapnia: Secondary | ICD-10-CM | POA: Diagnosis not present

## 2018-10-15 DIAGNOSIS — I5023 Acute on chronic systolic (congestive) heart failure: Secondary | ICD-10-CM | POA: Diagnosis present

## 2018-10-15 DIAGNOSIS — D638 Anemia in other chronic diseases classified elsewhere: Secondary | ICD-10-CM

## 2018-10-15 DIAGNOSIS — Z961 Presence of intraocular lens: Secondary | ICD-10-CM | POA: Diagnosis present

## 2018-10-15 DIAGNOSIS — R2681 Unsteadiness on feet: Secondary | ICD-10-CM | POA: Insufficient documentation

## 2018-10-15 DIAGNOSIS — E860 Dehydration: Secondary | ICD-10-CM | POA: Diagnosis present

## 2018-10-15 DIAGNOSIS — E872 Acidosis: Secondary | ICD-10-CM | POA: Diagnosis present

## 2018-10-15 DIAGNOSIS — I252 Old myocardial infarction: Secondary | ICD-10-CM

## 2018-10-15 DIAGNOSIS — Z803 Family history of malignant neoplasm of breast: Secondary | ICD-10-CM

## 2018-10-15 DIAGNOSIS — C9000 Multiple myeloma not having achieved remission: Secondary | ICD-10-CM

## 2018-10-15 DIAGNOSIS — B370 Candidal stomatitis: Secondary | ICD-10-CM | POA: Diagnosis present

## 2018-10-15 DIAGNOSIS — E870 Hyperosmolality and hypernatremia: Secondary | ICD-10-CM | POA: Diagnosis present

## 2018-10-15 DIAGNOSIS — F17210 Nicotine dependence, cigarettes, uncomplicated: Secondary | ICD-10-CM | POA: Insufficient documentation

## 2018-10-15 DIAGNOSIS — E43 Unspecified severe protein-calorie malnutrition: Secondary | ICD-10-CM | POA: Diagnosis present

## 2018-10-15 DIAGNOSIS — I495 Sick sinus syndrome: Secondary | ICD-10-CM | POA: Diagnosis present

## 2018-10-15 DIAGNOSIS — N179 Acute kidney failure, unspecified: Secondary | ICD-10-CM | POA: Diagnosis present

## 2018-10-15 DIAGNOSIS — R42 Dizziness and giddiness: Secondary | ICD-10-CM | POA: Insufficient documentation

## 2018-10-15 DIAGNOSIS — I471 Supraventricular tachycardia: Secondary | ICD-10-CM | POA: Diagnosis present

## 2018-10-15 DIAGNOSIS — A419 Sepsis, unspecified organism: Principal | ICD-10-CM | POA: Diagnosis present

## 2018-10-15 DIAGNOSIS — I4821 Permanent atrial fibrillation: Secondary | ICD-10-CM | POA: Diagnosis present

## 2018-10-15 DIAGNOSIS — Z66 Do not resuscitate: Secondary | ICD-10-CM | POA: Diagnosis present

## 2018-10-15 DIAGNOSIS — R109 Unspecified abdominal pain: Secondary | ICD-10-CM

## 2018-10-15 DIAGNOSIS — E876 Hypokalemia: Secondary | ICD-10-CM | POA: Diagnosis not present

## 2018-10-15 DIAGNOSIS — K559 Vascular disorder of intestine, unspecified: Secondary | ICD-10-CM | POA: Diagnosis present

## 2018-10-15 DIAGNOSIS — Z9841 Cataract extraction status, right eye: Secondary | ICD-10-CM

## 2018-10-15 DIAGNOSIS — A09 Infectious gastroenteritis and colitis, unspecified: Secondary | ICD-10-CM | POA: Diagnosis present

## 2018-10-15 DIAGNOSIS — I4581 Long QT syndrome: Secondary | ICD-10-CM | POA: Diagnosis present

## 2018-10-15 DIAGNOSIS — I248 Other forms of acute ischemic heart disease: Secondary | ICD-10-CM | POA: Diagnosis present

## 2018-10-15 LAB — URINALYSIS, ROUTINE W REFLEX MICROSCOPIC
Bilirubin Urine: NEGATIVE
GLUCOSE, UA: NEGATIVE mg/dL
Hgb urine dipstick: NEGATIVE
Ketones, ur: 5 mg/dL — AB
LEUKOCYTES UA: NEGATIVE
Nitrite: NEGATIVE
Protein, ur: NEGATIVE mg/dL
Specific Gravity, Urine: 1.016 (ref 1.005–1.030)
pH: 5 (ref 5.0–8.0)

## 2018-10-15 LAB — COMPREHENSIVE METABOLIC PANEL
ALK PHOS: 50 U/L (ref 38–126)
ALT: 10 U/L (ref 0–44)
AST: 16 U/L (ref 15–41)
Albumin: 3.6 g/dL (ref 3.5–5.0)
Anion gap: 12 (ref 5–15)
BILIRUBIN TOTAL: 0.8 mg/dL (ref 0.3–1.2)
BUN: 11 mg/dL (ref 8–23)
CO2: 18 mmol/L — ABNORMAL LOW (ref 22–32)
Calcium: 9.2 mg/dL (ref 8.9–10.3)
Chloride: 109 mmol/L (ref 98–111)
Creatinine, Ser: 1.32 mg/dL — ABNORMAL HIGH (ref 0.44–1.00)
GFR calc Af Amer: 47 mL/min — ABNORMAL LOW (ref >60)
GFR calc non Af Amer: 40 mL/min — ABNORMAL LOW (ref >60)
GLUCOSE: 68 mg/dL — AB (ref 70–99)
Potassium: 3.7 mmol/L (ref 3.5–5.1)
Sodium: 139 mmol/L (ref 135–145)
TOTAL PROTEIN: 6.7 g/dL (ref 6.5–8.1)

## 2018-10-15 LAB — CBC WITH DIFFERENTIAL/PLATELET
Abs Immature Granulocytes: 0.07 10*3/uL (ref 0.00–0.07)
Basophils Absolute: 0 10*3/uL (ref 0.0–0.1)
Basophils Relative: 1 %
Eosinophils Absolute: 0 10*3/uL (ref 0.0–0.5)
Eosinophils Relative: 1 %
HCT: 28.9 % — ABNORMAL LOW (ref 36.0–46.0)
Hemoglobin: 9 g/dL — ABNORMAL LOW (ref 12.0–15.0)
IMMATURE GRANULOCYTES: 2 %
Lymphocytes Relative: 14 %
Lymphs Abs: 0.5 10*3/uL — ABNORMAL LOW (ref 0.7–4.0)
MCH: 30 pg (ref 26.0–34.0)
MCHC: 31.1 g/dL (ref 30.0–36.0)
MCV: 96.3 fL (ref 80.0–100.0)
MONOS PCT: 28 %
Monocytes Absolute: 0.9 10*3/uL (ref 0.1–1.0)
Neutro Abs: 1.7 10*3/uL (ref 1.7–7.7)
Neutrophils Relative %: 54 %
Platelets: 195 10*3/uL (ref 150–400)
RBC: 3 MIL/uL — ABNORMAL LOW (ref 3.87–5.11)
RDW: 19.2 % — ABNORMAL HIGH (ref 11.5–15.5)
WBC: 3.2 10*3/uL — ABNORMAL LOW (ref 4.0–10.5)
nRBC: 0 % (ref 0.0–0.2)

## 2018-10-15 LAB — GLUCOSE, CAPILLARY
GLUCOSE-CAPILLARY: 67 mg/dL — AB (ref 70–99)
Glucose-Capillary: 70 mg/dL (ref 70–99)

## 2018-10-15 MED ORDER — POTASSIUM CHLORIDE IN NACL 20-0.9 MEQ/L-% IV SOLN
INTRAVENOUS | Status: DC
  Administered 2018-10-15 – 2018-10-16 (×2): via INTRAVENOUS
  Filled 2018-10-15 (×3): qty 1000

## 2018-10-15 MED ORDER — FERROUS SULFATE 325 (65 FE) MG PO TABS: 325.0000 mg | ORAL_TABLET | Freq: Every day | ORAL | Status: DC

## 2018-10-15 MED ORDER — APIXABAN 2.5 MG PO TABS
2.5000 mg | ORAL_TABLET | Freq: Two times a day (BID) | ORAL | Status: DC
  Administered 2018-10-15: 2.5 mg via ORAL
  Filled 2018-10-15 (×2): qty 1

## 2018-10-15 MED ORDER — HYDROCODONE-ACETAMINOPHEN 5-325 MG PO TABS
1.0000 | ORAL_TABLET | Freq: Two times a day (BID) | ORAL | Status: DC | PRN
  Administered 2018-10-15 – 2018-10-19 (×7): 1 via ORAL
  Filled 2018-10-15 (×8): qty 1

## 2018-10-15 MED ORDER — ENSURE ENLIVE PO LIQD: 237.0000 mL | Freq: Two times a day (BID) | ORAL | Status: DC

## 2018-10-15 MED ORDER — ATORVASTATIN CALCIUM 40 MG PO TABS
40.0000 mg | ORAL_TABLET | Freq: Every day | ORAL | Status: DC
  Administered 2018-10-15 – 2018-10-18 (×4): 40 mg via ORAL
  Filled 2018-10-15 (×4): qty 1

## 2018-10-15 MED ORDER — POTASSIUM CHLORIDE CRYS ER 20 MEQ PO TBCR
20.0000 meq | EXTENDED_RELEASE_TABLET | Freq: Every day | ORAL | Status: DC
  Administered 2018-10-15 – 2018-10-17 (×3): 20 meq via ORAL
  Filled 2018-10-15 (×3): qty 1

## 2018-10-15 MED ORDER — NITROGLYCERIN 0.4 MG SL SUBL: 0.4000 mg | SUBLINGUAL_TABLET | SUBLINGUAL | Status: DC | PRN

## 2018-10-15 MED ORDER — ONDANSETRON 4 MG PO TBDP: 8.0000 mg | ORAL_TABLET | Freq: Three times a day (TID) | ORAL | Status: DC | PRN

## 2018-10-15 MED ORDER — FERROUS SULFATE 325 (65 FE) MG PO TABS
325.0000 mg | ORAL_TABLET | Freq: Every day | ORAL | Status: DC
  Administered 2018-10-16 – 2018-10-19 (×4): 325 mg via ORAL
  Filled 2018-10-15 (×4): qty 1

## 2018-10-15 MED ORDER — PYRIDOSTIGMINE BROMIDE 60 MG PO TABS
30.0000 mg | ORAL_TABLET | Freq: Two times a day (BID) | ORAL | Status: DC
  Administered 2018-10-15 – 2018-10-19 (×8): 30 mg via ORAL
  Filled 2018-10-15 (×8): qty 0.5

## 2018-10-15 MED ORDER — CALCIUM CARBONATE 1250 (500 CA) MG PO TABS: 1.0000 | ORAL_TABLET | Freq: Every day | ORAL | Status: DC

## 2018-10-15 MED ORDER — ENSURE ENLIVE PO LIQD
237.0000 mL | Freq: Three times a day (TID) | ORAL | Status: DC
  Administered 2018-10-15 – 2018-10-19 (×2): 237 mL via ORAL

## 2018-10-15 MED ORDER — ACYCLOVIR 400 MG PO TABS
400.0000 mg | ORAL_TABLET | Freq: Two times a day (BID) | ORAL | Status: DC
  Administered 2018-10-15 – 2018-10-19 (×8): 400 mg via ORAL
  Filled 2018-10-15 (×8): qty 1

## 2018-10-15 MED ORDER — ADULT MULTIVITAMIN W/MINERALS CH
1.0000 | ORAL_TABLET | Freq: Every day | ORAL | Status: DC
  Administered 2018-10-15 – 2018-10-19 (×5): 1 via ORAL
  Filled 2018-10-15 (×5): qty 1

## 2018-10-15 MED ORDER — PANTOPRAZOLE SODIUM 40 MG PO TBEC
40.0000 mg | DELAYED_RELEASE_TABLET | Freq: Two times a day (BID) | ORAL | Status: DC
  Administered 2018-10-15 – 2018-10-19 (×8): 40 mg via ORAL
  Filled 2018-10-15 (×3): qty 1
  Filled 2018-10-15: qty 2
  Filled 2018-10-15 (×4): qty 1

## 2018-10-15 MED ORDER — CALCIUM CARBONATE 1250 (500 CA) MG PO TABS
1.0000 | ORAL_TABLET | Freq: Every day | ORAL | Status: DC
  Administered 2018-10-16 – 2018-10-19 (×4): 500 mg via ORAL
  Filled 2018-10-15 (×4): qty 1

## 2018-10-15 NOTE — Progress Notes (Addendum)
Initial Nutrition Assessment  DOCUMENTATION CODES:   Non-severe (moderate) malnutrition in context of chronic illness  INTERVENTION:   Increase Ensure Enlive po TID, each supplement provides 350 kcal and 20 grams of protein  Magic cup TID with meals, each supplement provides 290 kcal and 9 grams of protein   MVI daily  Recommend liberalizing diet to regular since pt is malnourished  NUTRITION DIAGNOSIS:   Moderate Malnutrition related to chronic illness(Multiple Myeloma) as evidenced by moderate muscle depletion, moderate fat depletion, mild fat depletion, mild muscle depletion, percent weight loss (11%).  GOAL:   Patient will meet greater than or equal to 90% of their needs  MONITOR:   PO intake, Supplement acceptance, Diet advancement, Weight trends, Labs  REASON FOR ASSESSMENT:   Malnutrition Screening Tool    ASSESSMENT:   72 y.o female with PMH multiple myeloma, A fib, CAD, CKD 3, HTN, GERD. Pt admitted with loss of appetite, significant weight loss, and dizziness.   Pt asleep when DI entered the room. Pt woke up and reported that she has had a decreased appetite and that nothing taste quite right and nothing sounds appetizing over the past few days.  Pt normally eats only one meal a day at lunch having half a sandwich. Pt reports drinking 2-3 chocolate ensures daily.   Pt reports not knowing UBW because it has been fluctuating so much. Pt knows that she has had weight loss .Per chart, pts weights have been trending down,11/7 171.8# and 12/27 152.7#. This is a 19# (11%) weight loss over ~ 2 months which is significant for time frame.   Discussed with pt importance of having small frequent meals and ordering cold foods to combat food aversions. Encouraged pt to continue eating foods that she likes and the importance of protein and calories to prevent further weight loss.   Medications reviewed and include:  Os Cal Ferrous Sulfate  Protonix Potassium Chloride   Eliquis IV fluids   Labs reviewed: Creatine 1.32 (H)    NUTRITION - FOCUSED PHYSICAL EXAM:    Most Recent Value  Orbital Region  Mild depletion  Upper Arm Region  Moderate depletion  Thoracic and Lumbar Region  No depletion  Buccal Region  No depletion  Temple Region  Moderate depletion  Clavicle Bone Region  Mild depletion  Clavicle and Acromion Bone Region  Mild depletion  Scapular Bone Region  Mild depletion  Dorsal Hand  Mild depletion  Patellar Region  Mild depletion  Anterior Thigh Region  Mild depletion  Posterior Calf Region  Mild depletion  Edema (RD Assessment)  None  Hair  Unable to assess  Eyes  Reviewed  Mouth  Reviewed  Skin  Reviewed  Nails  Reviewed       Diet Order:   Diet Order            Diet heart healthy/carb modified Room service appropriate? Yes; Fluid consistency: Thin  Diet effective now              EDUCATION NEEDS:   Not appropriate for education at this time  Skin:  Skin Assessment: Reviewed RN Assessment(dry)  Last BM:  12/24- Type 5  Height:   Ht Readings from Last 1 Encounters:  10/15/18 '5\' 5"'  (1.651 m)    Weight:   Wt Readings from Last 1 Encounters:  10/15/18 69.4 kg    Ideal Body Weight:  56.8 kg  BMI:  Body mass index is 25.48 kg/m.  Estimated Nutritional Needs:   Kcal:  1700-1900 kcal/d  Protein:  95-110 g/d  Fluid:  1.5 L    Mauricia Area, MS, Dietetic Intern Pager: [phone] After hours Pager: [phone]

## 2018-10-15 NOTE — H&P (Signed)
Referring Physician:  [name] is an 72 y.o. female.                       Chief Complaint: Dizziness and weight loss  HPI: 15 tear old female with multiple myeloma treated with chemotherapy has loss of appetite, significant weight loss in last 1 month and feels dizzy. Orthostatic vital signs were positive with 20 mm drop in systolic and 10 mm drop in diastolic pressure with standing from sitting position. No signs of GI bleed in last 1 month but she has started chemotherapy for last 3 weeks and has remained sick on stomach since then. She is able to keep Ensure feedings 2-3 times a day. She denies fever or urinary symptoms.  Past Medical History:  Diagnosis Date  . ACS (acute coronary syndrome) ([facility]) 06/2017  . Anemia    "off and on all my life" (12/22/2016)  . Anxiety   . Bilateral renal artery stenosis ([place]) 1999   s/p stenting  . Chronic kidney disease (CKD), stage III (moderate) ([place])    Archie Endo 12/22/2016  . Coronary artery disease 1999  . Depression   . Diverticulosis   . Duodenitis   . DVT (deep venous thrombosis) ([facility])    on Coumadin  . Dyspnea   . Gastric erosions   . Gastric ulcer   . GERD (gastroesophageal reflux disease)   . Heart murmur   . History of blood transfusion ~ 03/2016   "low HgB; practically nonexistent"  . Hyperlipidemia   . Hypertension   . MI (myocardial infarction) ([facility]) 1999  . Multiple myeloma ([place]) dx'd ~ 04/2016  . PAT (paroxysmal atrial tachycardia) ([facility]) 12/22/2016  . Retinal hemorrhage, right eye   . Schatzki's ring   . Tachy-brady syndrome ([place])    Archie Endo 12/22/2016  . Tubular adenoma of colon       Past Surgical History:  Procedure Laterality Date  . APPENDECTOMY    . BACK SURGERY    . CATARACT EXTRACTION W/ INTRAOCULAR LENS IMPLANT Right 2014  . COLONOSCOPY WITH PROPOFOL N/A 09/04/2017   Procedure: COLONOSCOPY WITH PROPOFOL;  Surgeon: Doran Stabler, MD;  Location: [facility];  Service: Gastroenterology;  Laterality: N/A;  .  CORONARY ANGIOGRAPHY N/A 01/23/2017   Procedure: Coronary Angiography;  Surgeon: [name], MD;  Location: [place] [facility];  Service: Cardiovascular;  Laterality: N/A;  . CORONARY ANGIOPLASTY WITH STENT PLACEMENT  1999  . CORONARY STENT INTERVENTION N/A 12/29/2016   Procedure: Coronary Stent Intervention;  Surgeon: Charolette Forward, MD;  Location: [place] [facility];  Service: Cardiovascular;  Laterality: N/A;  . CORONARY STENT INTERVENTION N/A 02/24/2017   Procedure: Coronary Stent Intervention;  Surgeon: Charolette Forward, MD;  Location: [place] [facility];  Service: Cardiovascular;  Laterality: N/A;  . ESOPHAGOGASTRODUODENOSCOPY N/A 11/03/2015   Procedure: ESOPHAGOGASTRODUODENOSCOPY (EGD);  Surgeon: Carol Ada, MD;  [facility];  Service: Endoscopy;  Laterality: N/A;  . ESOPHAGOGASTRODUODENOSCOPY N/A 09/03/2017   Procedure: ESOPHAGOGASTRODUODENOSCOPY (EGD);  Surgeon: Doran Stabler, MD;  Location: [facility];  Service: Gastroenterology;  Laterality: N/A;  . ESOPHAGOGASTRODUODENOSCOPY (EGD) WITH PROPOFOL N/A 11/18/2016   Procedure: ESOPHAGOGASTRODUODENOSCOPY (EGD) WITH PROPOFOL;  Surgeon: Ladene Artist, MD;  Location: [facility];  Service: Endoscopy;  Laterality: N/A;  . LEFT HEART CATH AND CORONARY ANGIOGRAPHY N/A 12/24/2016   Procedure: Left Heart Cath and Coronary Angiography;  Surgeon: [name], MD;  Location: [place] [facility];  Service: Cardiovascular;  Laterality: N/A;  .  LEFT HEART CATH AND CORONARY ANGIOGRAPHY N/A 02/24/2017   Procedure: Left Heart Cath and Coronary Angiography;  Surgeon: Charolette Forward, MD;  Location: [place] [facility];  Service: Cardiovascular;  Laterality: N/A;  . LEFT HEART CATH AND CORONARY ANGIOGRAPHY N/A 03/05/2017   Procedure: Left Heart Cath and Coronary Angiography;  Surgeon: [name], MD;  Location: [place];  Service: Cardiovascular;  Laterality: N/A;  . LEFT HEART CATH AND CORONARY ANGIOGRAPHY N/A 09/02/2018   Procedure:  LEFT HEART CATH AND CORONARY ANGIOGRAPHY;  Surgeon: [name], MD;  Location: [place];  Service: Cardiovascular;  Laterality: N/A;  . LUMBAR [place]    . RENAL ARTERY STENT  1999   bil RAS so ? bil vs unilateral stents  . RETINAL LASER PROCEDURE Right 2012   "bleeding"  . TONSILLECTOMY    . TOTAL ABDOMINAL HYSTERECTOMY      Family History  Problem Relation Age of Onset  . Breast cancer Mother   . Breast cancer Sister   . Breast cancer Maternal Aunt   . Colon cancer Neg Hx    Social History:  reports that she has been smoking cigarettes. She has a 25.00 pack-year smoking history. She has never used smokeless tobacco. She reports that she does not drink alcohol or use drugs.  Allergies:  Allergies  Allergen Reactions  . Sulfa Antibiotics Rash    Medications Prior to Admission  Medication Sig Dispense Refill  . acyclovir (ZOVIRAX) 400 MG tablet TAKE 1 TABLET (400 MG TOTAL) BY MOUTH 2 (TWO) TIMES DAILY. 60 tablet 2  . amiodarone (PACERONE) 200 MG tablet Take 1 tablet (200 mg total) by mouth daily. 30 tablet 3  . apixaban (ELIQUIS) 2.5 MG TABS tablet Take 1 tablet (2.5 mg total) by mouth 2 (two) times daily. 60 tablet 3  . atorvastatin (LIPITOR) 80 MG tablet Take 0.5 tablets (40 mg total) by mouth daily at 6 PM. 30 tablet 3  . calcium carbonate (OS-CAL - DOSED IN MG OF ELEMENTAL CALCIUM) 1250 (500 Ca) MG tablet Take 1 tablet by mouth daily with breakfast.    . cyclophosphamide (CYTOXAN) 50 MG capsule Take 10 capsules (537m) by mouth once weekly for 3 weeks on, 1 week off, repeat every 4 weeks. Take early in the day. Maintain hydration. 30 capsule 0  . feeding supplement, ENSURE ENLIVE, (ENSURE ENLIVE) LIQD Take 237 mLs by mouth 2 (two) times daily between meals. (Patient taking differently: Take 237 mLs by mouth See admin instructions. Drink one bottle (237 ml) by mouth one or two times daily as a meal supplement) 60 Bottle 12  . ferrous sulfate 325 (65 FE) MG tablet  Take 1 tablet (325 mg total) by mouth daily with breakfast. 30 tablet 3  . HYDROcodone-acetaminophen (NORCO/VICODIN) 5-325 MG tablet Take 1 tablet by mouth 2 (two) times daily as needed for moderate pain. 60 tablet 0  . nitroGLYCERIN (NITROSTAT) 0.4 MG SL tablet Place 0.4 mg under the tongue every 5 (five) minutes as needed for chest pain.  1  . ondansetron (ZOFRAN ODT) 8 MG disintegrating tablet Take 1 tablet (8 mg total) by mouth every 8 (eight) hours as needed for nausea or vomiting. 10 tablet 0  . pantoprazole (PROTONIX) 40 MG tablet Take 1 tablet (40 mg total) by mouth 2 (two) times daily before a meal. 60 tablet 3  . Potassium Chloride ER (K-TAB) 20 MEQ TBCR Take 20 mEq by mouth daily. Resume today    . prochlorperazine (COMPAZINE)  5 MG tablet Take 1 tablet (5 mg total) by mouth every 6 (six) hours as needed for nausea or vomiting. (Patient not taking: Reported on 10/07/2018) 30 tablet 0  . pyridostigmine (MESTINON) 60 MG tablet Take 0.5 tablets (30 mg total) by mouth 2 (two) times daily. 30 tablet 3  . tiZANidine (ZANAFLEX) 4 MG tablet Take 4 mg by mouth at bedtime as needed for muscle spasms.   2    No results found for this or any previous visit (from the past 48 hour(s)). No results found.  Review Of Systems Constitutional: No fever, chills, positive weight loss. Eyes: Positive vision change, wears glasses. No discharge or pain. Ears: No hearing loss, No tinnitus. Respiratory: No asthma, COPD, pneumonias. Positive shortness of breath. No hemoptysis. Cardiovascular: Positive chest pain, palpitation, leg edema. Gastrointestinal: Positive nausea, vomiting, no diarrhea, constipation. Positive GI bleed. No hepatitis. Genitourinary: No dysuria, hematuria, kidney stone. No incontinance. Neurological: Positive headache, stroke, seizures.  Psychiatry: No psych facility admission for anxiety, depression, suicide. No detox. Skin: No rash. Musculoskeletal: Positive joint pain, fibromyalgia,  neck pain, back pain. Lymphadenopathy: No lymphadenopathy. Hematology: Positive anemia or easy bruising.   Blood pressure (!) 143/84, pulse 62, temperature 99.3 F (37.4 C), temperature source Oral, resp. rate 18, height _0  (1.651 m), weight 69.4 kg, SpO2 100 %. Body mass index is 25.48 kg/m. General appearance: alert, cooperative, appears stated age and no distress Head: Normocephalic, atraumatic. Eyes: Brown eyes, pale pink conjunctiva, corneas clear. PERRL, EOM's intact. Throat-pale tongue with mild driness. Neck: No adenopathy, no carotid bruit, no JVD, supple, symmetrical, trachea midline and thyroid not enlarged. Resp: Clear to auscultation bilaterally. Cardio: Irregular rate and rhythm, S1, S2 normal, III/VI systolic murmur LSB and apex, no click, rub or gallop. GI: Soft, non-tender; bowel sounds normal; no organomegaly. Extremities: No edema, cyanosis or clubbing. Skin: Warm and dry.  Neurologic: Alert and oriented X 3, normal strength. Normal coordination and slow gait.  Assessment/Plan Dehydration Atrial fibrillation, CHA2DS2VASc score of 3 Anemia of chronic disease CAD S/P stents in LAD and LCx Multiple myeloma with chemotherapy Abnormal weight loss H/O DVT CKD, III Hypertension Moderate MR and TR  Place in observation. IV fluids. Blood work.  Birdie Riddle, MD  10/15/2018, 1:42 PM

## 2018-10-16 ENCOUNTER — Other Ambulatory Visit: Payer: Self-pay

## 2018-10-16 ENCOUNTER — Observation Stay (HOSPITAL_COMMUNITY): Payer: Medicare HMO

## 2018-10-16 LAB — BASIC METABOLIC PANEL
ANION GAP: 9 (ref 5–15)
BUN: 11 mg/dL (ref 8–23)
CO2: 19 mmol/L — ABNORMAL LOW (ref 22–32)
Calcium: 8.6 mg/dL — ABNORMAL LOW (ref 8.9–10.3)
Chloride: 110 mmol/L (ref 98–111)
Creatinine, Ser: 1.17 mg/dL — ABNORMAL HIGH (ref 0.44–1.00)
GFR calc Af Amer: 54 mL/min — ABNORMAL LOW (ref >60)
GFR calc non Af Amer: 47 mL/min — ABNORMAL LOW (ref >60)
Glucose, Bld: 82 mg/dL (ref 70–99)
Potassium: 4.2 mmol/L (ref 3.5–5.1)
Sodium: 138 mmol/L (ref 135–145)

## 2018-10-16 LAB — GLUCOSE, CAPILLARY
Glucose-Capillary: 78 mg/dL (ref 70–99)
Glucose-Capillary: 92 mg/dL (ref 70–99)
Glucose-Capillary: 75 mg/dL (ref 70–99)
Glucose-Capillary: 74 mg/dL (ref 70–99)

## 2018-10-16 LAB — CBC
HEMATOCRIT: 25.3 % — AB (ref 36.0–46.0)
Hemoglobin: 7.9 g/dL — ABNORMAL LOW (ref 12.0–15.0)
MCH: 29.4 pg (ref 26.0–34.0)
MCHC: 31.2 g/dL (ref 30.0–36.0)
MCV: 94.1 fL (ref 80.0–100.0)
Platelets: 202 10*3/uL (ref 150–400)
RBC: 2.69 MIL/uL — ABNORMAL LOW (ref 3.87–5.11)
RDW: 19.3 % — ABNORMAL HIGH (ref 11.5–15.5)
WBC: 3.1 10*3/uL — ABNORMAL LOW (ref 4.0–10.5)
nRBC: 0 % (ref 0.0–0.2)

## 2018-10-16 NOTE — Progress Notes (Signed)
Patient went down to CT for abd scan, when she arrived back on the unit HR was in the 130s sustaining. EKG done, MD notified. Patient asymptomatic.

## 2018-10-16 NOTE — Plan of Care (Signed)

## 2018-10-16 NOTE — Progress Notes (Signed)
Paged MD to update on  Patients status and of left side abdominal pain. Notified Dr Doylene Canard  and plan is to obtain a abd CT scan.

## 2018-10-16 NOTE — Progress Notes (Signed)
Ref: Nolene Ebbs, MD   Subjective:  Low blood sugars overnight without symptoms. Low WBC to 3.1K and Hgb. To 7.9. Potassium 4.2 meq and creatinine down to 1.17 from 1.32  Objective:  Vital Signs in the last 24 hours: Temp:  [98.6 F (37 C)-99.3 F (37.4 C)] 98.7 F (37.1 C) (12/28 0643) Pulse Rate:  [54-62] 54 (12/28 0643) Cardiac Rhythm: Sinus bradycardia (12/28 0701) Resp:  [18] 18 (12/28 0643) BP: (143-185)/(82-84) 185/84 (12/28 0643) SpO2:  [100 %] 100 % (12/28 0643) Weight:  [69.4 kg-69.5 kg] 69.5 kg (12/28 1504)  Physical Exam: BP Readings from Last 1 Encounters:  10/16/18 (!) 185/84     Wt Readings from Last 1 Encounters:  10/16/18 69.5 kg    Weight change:  Body mass index is 25.49 kg/m. HEENT: [place]/AT, Eyes-Brown, PERL, EOMI, Conjunctiva-Pale, Sclera-Non-icteric Neck: No JVD, No bruit, Trachea midline. Lungs:  Clear, Bilateral. Cardiac:  Irregular rhythm, normal S1 and S2, no S3. III/VI systolic murmur. Abdomen:  Soft, non-tender. BS present. Extremities:  No edema present. No cyanosis. No clubbing. CNS: AxOx3, Cranial nerves grossly intact, moves all 4 extremities. Slow gait. Skin: Warm and dry.   Intake/Output from previous day: 12/27 0701 - 12/28 0700 In: 1185 [P.O.:360; I.V.:825] Out: 500 [Urine:500]    Lab Results: BMET    Component Value Date/Time   NA 138 10/16/2018 0409   NA 139 10/15/2018 1406   NA 140 10/11/2018 0436   NA 144 09/17/2017 1429   NA 141 07/30/2017 1504   NA 142 06/11/2017 1451   K 4.2 10/16/2018 0409   K 3.7 10/15/2018 1406   K 4.2 10/11/2018 0436   K 3.1 (L) 09/17/2017 1429   K 4.8 07/30/2017 1504   K 4.3 06/11/2017 1451   CL 110 10/16/2018 0409   CL 109 10/15/2018 1406   CL 114 (H) 10/11/2018 0436   CO2 19 (L) 10/16/2018 0409   CO2 18 (L) 10/15/2018 1406   CO2 20 (L) 10/11/2018 0436   CO2 23 09/17/2017 1429   CO2 20 (L) 07/30/2017 1504   CO2 20 (L) 06/11/2017 1451   GLUCOSE 82 10/16/2018 0409   GLUCOSE 68 (L)  10/15/2018 1406   GLUCOSE 96 10/11/2018 0436   GLUCOSE 96 09/17/2017 1429   GLUCOSE 79 07/30/2017 1504   GLUCOSE 74 06/11/2017 1451   BUN 11 10/16/2018 0409   BUN 11 10/15/2018 1406   BUN 10 10/11/2018 0436   BUN 15.6 09/17/2017 1429   BUN 24.4 07/30/2017 1504   BUN 18.7 06/11/2017 1451   CREATININE 1.17 (H) 10/16/2018 0409   CREATININE 1.32 (H) 10/15/2018 1406   CREATININE 1.22 (H) 10/11/2018 0436   CREATININE 1.36 (H) 10/07/2018 0823   CREATININE 1.41 (H) 09/30/2018 1005   CREATININE 1.78 (H) 09/24/2018 1102   CREATININE 1.0 09/17/2017 1429   CREATININE 1.2 (H) 07/30/2017 1504   CREATININE 1.2 (H) 06/11/2017 1451   CALCIUM 8.6 (L) 10/16/2018 0409   CALCIUM 9.2 10/15/2018 1406   CALCIUM 8.4 (L) 10/11/2018 0436   CALCIUM 8.6 09/17/2017 1429   CALCIUM 9.1 07/30/2017 1504   CALCIUM 9.6 06/11/2017 1451   GFRNONAA 47 (L) 10/16/2018 0409   GFRNONAA 40 (L) 10/15/2018 1406   GFRNONAA 45 (L) 10/11/2018 0436   GFRNONAA 39 (L) 10/07/2018 0823   GFRNONAA 37 (L) 09/30/2018 1005   GFRNONAA 28 (L) 09/24/2018 1102   GFRAA 54 (L) 10/16/2018 0409   GFRAA 47 (L) 10/15/2018 1406   GFRAA 52 (L) 10/11/2018 1364  GFRAA 45 (L) 10/07/2018 0823   GFRAA 43 (L) 09/30/2018 1005   GFRAA 33 (L) 09/24/2018 1102   CBC    Component Value Date/Time   WBC 3.1 (L) 10/16/2018 0409   RBC 2.69 (L) 10/16/2018 0409   HGB 7.9 (L) 10/16/2018 0409   HGB 9.6 (L) 10/07/2018 0823   HGB 11.3 (L) 09/17/2017 1428   HCT 25.3 (L) 10/16/2018 0409   HCT 35.8 09/17/2017 1428   PLT 202 10/16/2018 0409   PLT 269 10/07/2018 0823   PLT 231 09/17/2017 1428   MCV 94.1 10/16/2018 0409   MCV 93.2 09/17/2017 1428   MCH 29.4 10/16/2018 0409   MCHC 31.2 10/16/2018 0409   RDW 19.3 (H) 10/16/2018 0409   RDW 16.5 (H) 09/17/2017 1428   LYMPHSABS 0.5 (L) 10/15/2018 1406   LYMPHSABS 0.7 (L) 09/17/2017 1428   MONOABS 0.9 10/15/2018 1406   MONOABS 0.5 09/17/2017 1428   EOSABS 0.0 10/15/2018 1406   EOSABS 0.1 09/17/2017 1428    BASOSABS 0.0 10/15/2018 1406   BASOSABS 0.0 09/17/2017 1428   HEPATIC Function Panel Recent Labs    09/30/18 1005 10/07/18 0823 10/15/18 1406  PROT 8.3* 8.5* 6.7   HEMOGLOBIN A1C No components found for: HGA1C,  MPG CARDIAC ENZYMES Lab Results  Component Value Date   CKTOTAL 52 08/21/2017   CKMB 1.3 08/21/2017   TROPONINI 0.20 (HH) 10/03/2018   TROPONINI 0.27 (HH) 10/03/2018   TROPONINI 0.35 (HH) 108/27/2019   BNP No results for input(s): PROBNP in the last 8760 hours. TSH Recent Labs    12/13/17 2243 10/02/18 1825  TSH 1.922 0.751   CHOLESTEROL Recent Labs    12/14/17 0457 09/02/18 0152 10/03/18 0637  CHOL 167 143 136    Scheduled Meds: . acyclovir  400 mg Oral BID  . atorvastatin  40 mg Oral q1800  . calcium carbonate  1 tablet Oral Q breakfast  . feeding supplement (ENSURE ENLIVE)  237 mL Oral TID BM  . ferrous sulfate  325 mg Oral Q breakfast  . multivitamin with minerals  1 tablet Oral Daily  . pantoprazole  40 mg Oral BID AC  . potassium chloride SA  20 mEq Oral Daily  . pyridostigmine  30 mg Oral BID   Continuous Infusions: . 0.9 % NaCl with KCl 20 mEq / L 75 mL/hr at 10/15/18 1507   PRN Meds:.HYDROcodone-acetaminophen, nitroGLYCERIN, ondansetron  Assessment/Plan: Dehydration, improving Atrial fibrillation Anemia of chronic disease Abnormal weight loss Leukopenia Multiple myeloma with chemotherapy H/O DVT CKD, II Hypertension Moderate MR and TR CAD S/P stents in LAD and LCx  Hold Apixaban. Decrease IV fluids Increase activity.    LOS: 0 days    [name]  MD  10/16/2018, 10:27 AM

## 2018-10-16 NOTE — Progress Notes (Signed)
HR now back down to 50s-60s.

## 2018-10-17 ENCOUNTER — Other Ambulatory Visit: Payer: Self-pay

## 2018-10-17 ENCOUNTER — Other Ambulatory Visit: Payer: Self-pay | Admitting: Oncology

## 2018-10-17 LAB — CBC
HCT: 25.5 % — ABNORMAL LOW (ref 36.0–46.0)
Hemoglobin: 8.1 g/dL — ABNORMAL LOW (ref 12.0–15.0)
MCH: 29.6 pg (ref 26.0–34.0)
MCHC: 31.8 g/dL (ref 30.0–36.0)
MCV: 93.1 fL (ref 80.0–100.0)
Platelets: 206 10*3/uL (ref 150–400)
RBC: 2.74 MIL/uL — ABNORMAL LOW (ref 3.87–5.11)
RDW: 19.5 % — ABNORMAL HIGH (ref 11.5–15.5)
WBC: 3.6 10*3/uL — ABNORMAL LOW (ref 4.0–10.5)
nRBC: 0.6 % — ABNORMAL HIGH (ref 0.0–0.2)

## 2018-10-17 LAB — BASIC METABOLIC PANEL
Anion gap: 11 (ref 5–15)
BUN: 7 mg/dL — ABNORMAL LOW (ref 8–23)
CHLORIDE: 111 mmol/L (ref 98–111)
CO2: 17 mmol/L — ABNORMAL LOW (ref 22–32)
Calcium: 8.8 mg/dL — ABNORMAL LOW (ref 8.9–10.3)
Creatinine, Ser: 1.25 mg/dL — ABNORMAL HIGH (ref 0.44–1.00)
GFR calc Af Amer: 50 mL/min — ABNORMAL LOW (ref >60)
GFR calc non Af Amer: 43 mL/min — ABNORMAL LOW (ref >60)
Glucose, Bld: 74 mg/dL (ref 70–99)
Potassium: 4.6 mmol/L (ref 3.5–5.1)
Sodium: 139 mmol/L (ref 135–145)

## 2018-10-17 LAB — GLUCOSE, CAPILLARY
GLUCOSE-CAPILLARY: 73 mg/dL (ref 70–99)
Glucose-Capillary: 80 mg/dL (ref 70–99)
Glucose-Capillary: 78 mg/dL (ref 70–99)

## 2018-10-17 MED ORDER — POTASSIUM CHLORIDE CRYS ER 10 MEQ PO TBCR
10.0000 meq | EXTENDED_RELEASE_TABLET | Freq: Every day | ORAL | Status: DC
  Administered 2018-10-18 – 2018-10-19 (×2): 10 meq via ORAL
  Filled 2018-10-17 (×2): qty 1

## 2018-10-17 MED ORDER — BISACODYL 10 MG RE SUPP
10.0000 mg | Freq: Every day | RECTAL | Status: DC | PRN
  Administered 2018-10-17: 10 mg via RECTAL
  Filled 2018-10-17: qty 1

## 2018-10-17 MED ORDER — DILTIAZEM HCL 30 MG PO TABS
30.0000 mg | ORAL_TABLET | Freq: Two times a day (BID) | ORAL | Status: DC
  Administered 2018-10-17 – 2018-10-19 (×4): 30 mg via ORAL
  Filled 2018-10-17 (×4): qty 1

## 2018-10-17 NOTE — Progress Notes (Signed)
Patient refusing Ensure.

## 2018-10-17 NOTE — Progress Notes (Signed)
Patient states she is constipated, no BM in about a week, also abdominal pain.

## 2018-10-17 NOTE — Progress Notes (Signed)
patient is alert an oriented in bed with HR- A-fib RVR. 120-170s. Vital 98.8 150/118 RR-18, 02 sat 100 MD made aware, EKG performed no new orders at this time,

## 2018-10-17 NOTE — Progress Notes (Signed)
Ref: Nolene Ebbs, MD   Subjective:  Low blood sugars with not so good oral intake. Afebrile.  Mild systolic hypertension.  On Mestinon for myasthenia for over 2 years now. Patient claims to be unsteady on gait.  Objective:  Vital Signs in the last 24 hours: Temp:  [98.2 F (36.8 C)-98.6 F (37 C)] 98.2 F (36.8 C) (12/29 0351) Pulse Rate:  [54-57] 55 (12/29 0351) Cardiac Rhythm: Sinus bradycardia (12/29 0700) Resp:  [15-18] 18 (12/29 0351) BP: (152-190)/(70-85) 160/75 (12/29 0553) SpO2:  [100 %] 100 % (12/29 0351) Weight:  [69.5 kg] 69.5 kg (12/29 0344)  Physical Exam: BP Readings from Last 1 Encounters:  10/17/18 (!) 160/75     Wt Readings from Last 1 Encounters:  10/17/18 69.5 kg    Weight change: 0.054 kg Body mass index is 25.5 kg/m. HEENT: [place]/AT, Eyes-Brown, PERL, EOMI, Conjunctiva-Pink, Sclera-Non-icteric Neck: No JVD, No bruit, Trachea midline. Lungs:  Clear, Bilateral. Cardiac:  Irregular rhythm, normal S1 and S2, no S3. II/VI systolic murmur. Abdomen:  Soft, non-tender. BS present. Extremities:  No edema present. No cyanosis. No clubbing. CNS: AxOx3, Cranial nerves grossly intact, moves all 4 extremities. Slow gait. Skin: Warm and dry.   Intake/Output from previous day: 12/28 0701 - 12/29 0700 In: 1575.6 [P.O.:480; I.V.:1095.6] Out: 1750 [Urine:1750]    Lab Results: BMET    Component Value Date/Time   NA 139 10/17/2018 0648   NA 138 10/16/2018 0409   NA 139 10/15/2018 1406   NA 144 09/17/2017 1429   NA 141 07/30/2017 1504   NA 142 06/11/2017 1451   K 4.6 10/17/2018 0648   K 4.2 10/16/2018 0409   K 3.7 10/15/2018 1406   K 3.1 (L) 09/17/2017 1429   K 4.8 07/30/2017 1504   K 4.3 06/11/2017 1451   CL 111 10/17/2018 0648   CL 110 10/16/2018 0409   CL 109 10/15/2018 1406   CO2 17 (L) 10/17/2018 0648   CO2 19 (L) 10/16/2018 0409   CO2 18 (L) 10/15/2018 1406   CO2 23 09/17/2017 1429   CO2 20 (L) 07/30/2017 1504   CO2 20 (L) 06/11/2017 1451   GLUCOSE 74 10/17/2018 0648   GLUCOSE 82 10/16/2018 0409   GLUCOSE 68 (L) 10/15/2018 1406   GLUCOSE 96 09/17/2017 1429   GLUCOSE 79 07/30/2017 1504   GLUCOSE 74 06/11/2017 1451   BUN 7 (L) 10/17/2018 0648   BUN 11 10/16/2018 0409   BUN 11 10/15/2018 1406   BUN 15.6 09/17/2017 1429   BUN 24.4 07/30/2017 1504   BUN 18.7 06/11/2017 1451   CREATININE 1.25 (H) 10/17/2018 0648   CREATININE 1.17 (H) 10/16/2018 0409   CREATININE 1.32 (H) 10/15/2018 1406   CREATININE 1.36 (H) 10/07/2018 0823   CREATININE 1.41 (H) 09/30/2018 1005   CREATININE 1.78 (H) 09/24/2018 1102   CREATININE 1.0 09/17/2017 1429   CREATININE 1.2 (H) 07/30/2017 1504   CREATININE 1.2 (H) 06/11/2017 1451   CALCIUM 8.8 (L) 10/17/2018 0648   CALCIUM 8.6 (L) 10/16/2018 0409   CALCIUM 9.2 10/15/2018 1406   CALCIUM 8.6 09/17/2017 1429   CALCIUM 9.1 07/30/2017 1504   CALCIUM 9.6 06/11/2017 1451   GFRNONAA 43 (L) 10/17/2018 0648   GFRNONAA 47 (L) 10/16/2018 0409   GFRNONAA 40 (L) 10/15/2018 1406   GFRNONAA 39 (L) 10/07/2018 0823   GFRNONAA 37 (L) 09/30/2018 1005   GFRNONAA 28 (L) 09/24/2018 1102   GFRAA 50 (L) 10/17/2018 0648   GFRAA 54 (L) 10/16/2018 0409  GFRAA 47 (L) 10/15/2018 1406   GFRAA 45 (L) 10/07/2018 0823   GFRAA 43 (L) 09/30/2018 1005   GFRAA 33 (L) 09/24/2018 1102   CBC    Component Value Date/Time   WBC 3.6 (L) 10/17/2018 0648   RBC 2.74 (L) 10/17/2018 0648   HGB 8.1 (L) 10/17/2018 0648   HGB 9.6 (L) 10/07/2018 0823   HGB 11.3 (L) 09/17/2017 1428   HCT 25.5 (L) 10/17/2018 0648   HCT 35.8 09/17/2017 1428   PLT 206 10/17/2018 0648   PLT 269 10/07/2018 0823   PLT 231 09/17/2017 1428   MCV 93.1 10/17/2018 0648   MCV 93.2 09/17/2017 1428   MCH 29.6 10/17/2018 0648   MCHC 31.8 10/17/2018 0648   RDW 19.5 (H) 10/17/2018 0648   RDW 16.5 (H) 09/17/2017 1428   LYMPHSABS 0.5 (L) 10/15/2018 1406   LYMPHSABS 0.7 (L) 09/17/2017 1428   MONOABS 0.9 10/15/2018 1406   MONOABS 0.5 09/17/2017 1428   EOSABS 0.0  10/15/2018 1406   EOSABS 0.1 09/17/2017 1428   BASOSABS 0.0 10/15/2018 1406   BASOSABS 0.0 09/17/2017 1428   HEPATIC Function Panel Recent Labs    09/30/18 1005 10/07/18 0823 10/15/18 1406  PROT 8.3* 8.5* 6.7   HEMOGLOBIN A1C No components found for: HGA1C,  MPG CARDIAC ENZYMES Lab Results  Component Value Date   CKTOTAL 52 08/21/2017   CKMB 1.3 08/21/2017   TROPONINI 0.20 (HH) 10/03/2018   TROPONINI 0.27 (HH) 10/03/2018   TROPONINI 0.35 (HH) 12020-02-2118   BNP No results for input(s): PROBNP in the last 8760 hours. TSH Recent Labs    12/13/17 2243 10/02/18 1825  TSH 1.922 0.751   CHOLESTEROL Recent Labs    12/14/17 0457 09/02/18 0152 10/03/18 0637  CHOL 167 143 136    Scheduled Meds: . acyclovir  400 mg Oral BID  . atorvastatin  40 mg Oral q1800  . calcium carbonate  1 tablet Oral Q breakfast  . feeding supplement (ENSURE ENLIVE)  237 mL Oral TID BM  . ferrous sulfate  325 mg Oral Q breakfast  . multivitamin with minerals  1 tablet Oral Daily  . pantoprazole  40 mg Oral BID AC  . [START ON 10/18/2018] potassium chloride SA  10 mEq Oral Daily  . pyridostigmine  30 mg Oral BID   Continuous Infusions: . 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  Encourage PO oral intake and activity with walker   LOS: 0 days    [name]  MD  10/17/2018, 11:32 AM

## 2018-10-17 NOTE — Progress Notes (Signed)
BP runs high, patient does not endorse any headache or other symptoms.

## 2018-10-17 NOTE — Progress Notes (Signed)
Patient continues to have nausea with no appetite. MD aware, Iv fluid running at a slow rate, supp given to produce a stool.

## 2018-10-17 NOTE — Plan of Care (Signed)
?  Problem: Activity: ?Goal: Capacity to carry out activities will improve ?Outcome: Progressing ?  ?

## 2018-10-17 NOTE — Plan of Care (Signed)
  Problem: Cardiac: Goal: Ability to achieve and maintain adequate cardiopulmonary perfusion will improve Outcome: Progressing Note:  Continuing Cardiac monitoring HR stable.

## 2018-10-18 LAB — GLUCOSE, CAPILLARY
GLUCOSE-CAPILLARY: 84 mg/dL (ref 70–99)
Glucose-Capillary: 71 mg/dL (ref 70–99)
Glucose-Capillary: 80 mg/dL (ref 70–99)
Glucose-Capillary: 74 mg/dL (ref 70–99)
Glucose-Capillary: 68 mg/dL — ABNORMAL LOW (ref 70–99)

## 2018-10-18 NOTE — Evaluation (Signed)
Physical Therapy Evaluation Patient Details Name: Abigail [name] MRN: [id number] DOB: [birth year] Today's Date: 10/18/2018   History of Present Illness  Patient is a 72 y/o female who presents with dizziness and weight loss. Found to have positive orthostatics in the ED. Admitted with dehydration. PMH includes multiple myeloma s/p chemo, CAD, DVT, MI, CKD.  Clinical Impression  Patient presents with dizziness, decreased activity tolerance, fatigue and impaired mobility s/p above. Pt with positive orthostatics limiting mobility initially. See vitals below. Pt lives alone and mod I PTA using RW. Tolerated transfers and gait training today with close Min guard for balance/safety. Fatigue and weakness are pt's biggest limiting factors. Reports feeling like she was going to black out once returning to room. Supine BP 170/78 HR 58 bpm Sitting BP 110/99  HR 72 bpm Standing BP 134/85  HR 77 bpm  Post walk BP 152/92 Pt reports dizziness on/off throughout session. HR ranged from 58-111 bpm during mobility. Encouraged walking with nursing. Will follow acutely to maximize independence and mobility prior to return home.      Follow Up Recommendations Home health PT;Supervision - Intermittent    Equipment Recommendations  None recommended by PT    Recommendations for Other Services       Precautions / Restrictions Precautions Precautions: Fall Precaution Comments: orthostatic hypotension Restrictions Weight Bearing Restrictions: No      Mobility  Bed Mobility Overal bed mobility: Needs Assistance Bed Mobility: Supine to Sit;Sit to Supine     Supine to sit: Modified independent (Device/Increase time);HOB elevated Sit to supine: Modified independent (Device/Increase time);HOB elevated   General bed mobility comments: Increased time but no assist needed. + dizziness getting to EOB x2.   Transfers Overall transfer level: Needs assistance Equipment used: Rolling walker (2  wheeled);None Transfers: Sit to/from Stand Sit to Stand: Min guard         General transfer comment: Min guard for safety. Stood from [employer], SPT bed to/from [facility] with close Min guard.   Ambulation/Gait Ambulation/Gait assistance: Min guard Gait Distance (Feet): 75 Feet Assistive device: Rolling walker (2 wheeled) Gait Pattern/deviations: Step-through pattern;Decreased stride length;Trunk flexed Gait velocity: decreased   General Gait Details: Slow, mildly unsteady gait with cues for RW proximity. 1 standing rest break due to fatigue. HR ranged from 70-111 bpm A-fib. Very fatigued.   Stairs            Wheelchair Mobility    Modified Rankin (Stroke Patients Only)       Balance Overall balance assessment: Needs assistance Sitting-balance support: Feet supported;No upper extremity supported Sitting balance-Leahy Scale: Fair Sitting balance - Comments: Able to perform pericare with close Min guard due to dizziness.    Standing balance support: During functional activity Standing balance-Leahy Scale: Fair Standing balance comment: Able to stand statically but close min guard needed due to dizziness; does better with UE support for ambulation.                             Pertinent Vitals/Pain Pain Assessment: No/denies pain    Home Living Family/patient expects to be discharged to:: Private residence Living Arrangements: Alone Available Help at Discharge: Family;Friend(s);Available PRN/intermittently Type of Home: Apartment Home Access: Stairs to enter Entrance Stairs-Rails: Left Entrance Stairs-Number of Steps: 3 Home Layout: One level Home Equipment: Walker - 2 wheels;Cane - single point;Grab bars - tub/shower      Prior Function Level of Independence: Independent with assistive device(s)  Comments: Mod indep with intermittent use of RW. Does not drive; friend drives to all appointments/errands.      Hand Dominance   Dominant Hand:  Right    Extremity/Trunk Assessment   Upper Extremity Assessment Upper Extremity Assessment: Defer to OT evaluation    Lower Extremity Assessment Lower Extremity Assessment: Generalized weakness       Communication   Communication: No difficulties  Cognition Arousal/Alertness: Awake/alert Behavior During Therapy: WFL for tasks assessed/performed Overall Cognitive Status: Within Functional Limits for tasks assessed                                        General Comments General comments (skin integrity, edema, etc.): See assessment for orthostatic vitals.    Exercises     Assessment/Plan    PT Assessment Patient needs continued PT services  PT Problem List Decreased strength;Decreased mobility;Decreased activity tolerance;Cardiopulmonary status limiting activity;Decreased balance;Decreased knowledge of use of DME       PT Treatment Interventions Functional mobility training;Patient/family education;Balance training;DME instruction;Gait training;Therapeutic activities;Therapeutic exercise;Stair training    PT Goals (Current goals can be found in the Care Plan section)  Acute Rehab PT Goals Patient Stated Goal: to feel better PT Goal Formulation: With patient Time For Goal Achievement: 11/01/18 Potential to Achieve Goals: Good    Frequency Min 3X/week   Barriers to discharge Decreased caregiver support      Co-evaluation               AM-PAC PT "6 Clicks" Mobility  Outcome Measure Help needed turning from your back to your side while in a flat bed without using bedrails?: A Little Help needed moving from lying on your back to sitting on the side of a flat bed without using bedrails?: A Little Help needed moving to and from a bed to a chair (including a wheelchair)?: A Little Help needed standing up from a chair using your arms (e.g., wheelchair or bedside chair)?: A Little Help needed to walk in hospital room?: A Little Help needed climbing  3-5 steps with a railing? : [employer] 6 Click Score: 17    End of Session Equipment Utilized During Treatment: Gait belt Activity Tolerance: Treatment limited secondary to medical complications (Comment);Patient limited by fatigue(orthostatic) Patient left: in bed;with call bell/phone within reach Nurse Communication: Mobility status PT Visit Diagnosis: Muscle weakness (generalized) (M62.81);Difficulty in walking, not elsewhere classified (R26.2);Dizziness and giddiness (R42)    Time: 6644-0347 PT Time Calculation (min) (ACUTE ONLY): 26 min   Charges:   PT Evaluation $PT Eval Moderate Complexity: 1 Mod PT Treatments $Therapeutic Activity: 8-22 mins        Wray Kearns, PT, DPT Acute Rehabilitation Services Pager [phone] Office [phone]      Marguarite Arbour A Sabra Heck 10/18/2018, 9:35 AM

## 2018-10-18 NOTE — Progress Notes (Signed)
Patient is alert and oriented with decreased appetite is on going. MD aware and plan to make revaluated  discharge.

## 2018-10-18 NOTE — Plan of Care (Signed)
?  Problem: Activity: ?Goal: Capacity to carry out activities will improve ?Outcome: Progressing ?  ?

## 2018-10-18 NOTE — Progress Notes (Signed)
Patient bs 17 and agree to eating plain peanut butter.

## 2018-10-18 NOTE — Care Management Note (Signed)
Case Management Note  Patient Details  Name: Abigail [name] MRN: [id number] Date of Birth: [birth year]  Subjective/Objective:   Dehydration                Action/Plan: Patient lives at home alone; PCP is Dr Nolene Ebbs; has private insurance with [facility] Medicare with prescription drug coverage; pharmacy of choice is [facility]; [place] choice offered, pt chose [facility]; Dan with [facility] called for arrangements; DME - walker and cane at home; CM will continue to follow for progression of care.  Expected Discharge Date:   p[ossibly 10/18/2018               Expected Discharge Plan:  [place]  Discharge planning Services  CM Consult Choice offered to:  Patient  HH Arranged:  RN, Disease Management, PT [place] Agency:  [place]  Status of Service:  In process, will continue to follow  Sherrilyn Rist [phone] 10/18/2018, 10:55 AM

## 2018-10-18 NOTE — Progress Notes (Signed)
Ref: Abigail Ebbs, MD   Subjective:  Feels better than yesterday. Orthostatic vitals improving with ambulation. Low dose diltiazem for heart rate control, gets tachycardic with activity and in evenings. Oral intake marginally improved. Afebrile.  Objective:  Vital Signs in the last 24 hours: Temp:  [98.4 F (36.9 C)-98.6 F (37 C)] 98.6 F (37 C) (12/30 1208) Pulse Rate:  [55-68] 67 (12/30 1208) Cardiac Rhythm: Normal sinus rhythm (12/30 0700) Resp:  [18] 18 (12/30 0518) BP: (147-173)/(69-95) 147/95 (12/30 1208) SpO2:  [100 %] 100 % (12/30 1208) Weight:  [68.4 kg] 68.4 kg (12/30 0518)  Physical Exam: BP Readings from Last 1 Encounters:  10/18/18 (!) 147/95     Wt Readings from Last 1 Encounters:  10/18/18 68.4 kg    Weight change: -1.143 kg Body mass index is 25.08 kg/m. HEENT: [place]/AT, Eyes-Brown, PERL, EOMI, Conjunctiva-Pink, Sclera-Non-icteric Neck: No JVD, No bruit, Trachea midline. Lungs:  Clear, Bilateral. Cardiac:  Irregular rhythm, normal S1 and S2, no S3. II/VI systolic murmur. Abdomen:  Soft, non-tender. BS present. Extremities:  No edema present. No cyanosis. No clubbing. CNS: AxOx3, Cranial nerves grossly intact, moves all 4 extremities.  Skin: Warm and dry.   Intake/Output from previous day: 12/29 0701 - 12/30 0700 In: 580 [P.O.:480; I.V.:100] Out: 1750 [Urine:1750]    Lab Results: BMET    Component Value Date/Time   NA 139 10/17/2018 0648   NA 138 10/16/2018 0409   NA 139 10/15/2018 1406   NA 144 09/17/2017 1429   NA 141 07/30/2017 1504   NA 142 06/11/2017 1451   K 4.6 10/17/2018 0648   K 4.2 10/16/2018 0409   K 3.7 10/15/2018 1406   K 3.1 (L) 09/17/2017 1429   K 4.8 07/30/2017 1504   K 4.3 06/11/2017 1451   CL 111 10/17/2018 0648   CL 110 10/16/2018 0409   CL 109 10/15/2018 1406   CO2 17 (L) 10/17/2018 0648   CO2 19 (L) 10/16/2018 0409   CO2 18 (L) 10/15/2018 1406   CO2 23 09/17/2017 1429   CO2 20 (L) 07/30/2017 1504   CO2 20 (L)  06/11/2017 1451   GLUCOSE 74 10/17/2018 0648   GLUCOSE 82 10/16/2018 0409   GLUCOSE 68 (L) 10/15/2018 1406   GLUCOSE 96 09/17/2017 1429   GLUCOSE 79 07/30/2017 1504   GLUCOSE 74 06/11/2017 1451   BUN 7 (L) 10/17/2018 0648   BUN 11 10/16/2018 0409   BUN 11 10/15/2018 1406   BUN 15.6 09/17/2017 1429   BUN 24.4 07/30/2017 1504   BUN 18.7 06/11/2017 1451   CREATININE 1.25 (H) 10/17/2018 0648   CREATININE 1.17 (H) 10/16/2018 0409   CREATININE 1.32 (H) 10/15/2018 1406   CREATININE 1.36 (H) 10/07/2018 0823   CREATININE 1.41 (H) 09/30/2018 1005   CREATININE 1.78 (H) 09/24/2018 1102   CREATININE 1.0 09/17/2017 1429   CREATININE 1.2 (H) 07/30/2017 1504   CREATININE 1.2 (H) 06/11/2017 1451   CALCIUM 8.8 (L) 10/17/2018 0648   CALCIUM 8.6 (L) 10/16/2018 0409   CALCIUM 9.2 10/15/2018 1406   CALCIUM 8.6 09/17/2017 1429   CALCIUM 9.1 07/30/2017 1504   CALCIUM 9.6 06/11/2017 1451   GFRNONAA 43 (L) 10/17/2018 0648   GFRNONAA 47 (L) 10/16/2018 0409   GFRNONAA 40 (L) 10/15/2018 1406   GFRNONAA 39 (L) 10/07/2018 0823   GFRNONAA 37 (L) 09/30/2018 1005   GFRNONAA 28 (L) 09/24/2018 1102   GFRAA 50 (L) 10/17/2018 0648   GFRAA 54 (L) 10/16/2018 0409   GFRAA 47 (  L) 10/15/2018 1406   GFRAA 45 (L) 10/07/2018 0823   GFRAA 43 (L) 09/30/2018 1005   GFRAA 33 (L) 09/24/2018 1102   CBC    Component Value Date/Time   WBC 3.6 (L) 10/17/2018 0648   RBC 2.74 (L) 10/17/2018 0648   HGB 8.1 (L) 10/17/2018 0648   HGB 9.6 (L) 10/07/2018 0823   HGB 11.3 (L) 09/17/2017 1428   HCT 25.5 (L) 10/17/2018 0648   HCT 35.8 09/17/2017 1428   PLT 206 10/17/2018 0648   PLT 269 10/07/2018 0823   PLT 231 09/17/2017 1428   MCV 93.1 10/17/2018 0648   MCV 93.2 09/17/2017 1428   MCH 29.6 10/17/2018 0648   MCHC 31.8 10/17/2018 0648   RDW 19.5 (H) 10/17/2018 0648   RDW 16.5 (H) 09/17/2017 1428   LYMPHSABS 0.5 (L) 10/15/2018 1406   LYMPHSABS 0.7 (L) 09/17/2017 1428   MONOABS 0.9 10/15/2018 1406   MONOABS 0.5 09/17/2017  1428   EOSABS 0.0 10/15/2018 1406   EOSABS 0.1 09/17/2017 1428   BASOSABS 0.0 10/15/2018 1406   BASOSABS 0.0 09/17/2017 1428   HEPATIC Function Panel Recent Labs    09/30/18 1005 10/07/18 0823 10/15/18 1406  PROT 8.3* 8.5* 6.7   HEMOGLOBIN A1C No components found for: HGA1C,  MPG CARDIAC ENZYMES Lab Results  Component Value Date   CKTOTAL 52 08/21/2017   CKMB 1.3 08/21/2017   TROPONINI 0.20 (HH) 10/03/2018   TROPONINI 0.27 (HH) 10/03/2018   TROPONINI 0.35 (HH) 105-01-2018   BNP No results for input(s): PROBNP in the last 8760 hours. TSH Recent Labs    12/13/17 2243 10/02/18 1825  TSH 1.922 0.751   CHOLESTEROL Recent Labs    12/14/17 0457 09/02/18 0152 10/03/18 0637  CHOL 167 143 136    Scheduled Meds: . acyclovir  400 mg Oral BID  . atorvastatin  40 mg Oral q1800  . calcium carbonate  1 tablet Oral Q breakfast  . diltiazem  30 mg Oral Q12H  . feeding supplement (ENSURE ENLIVE)  237 mL Oral TID BM  . ferrous sulfate  325 mg Oral Q breakfast  . multivitamin with minerals  1 tablet Oral Daily  . pantoprazole  40 mg Oral BID AC  . potassium chloride SA  10 mEq Oral Daily  . 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 Home v/s SNF.   LOS: 0 days    [name]  MD  10/18/2018, 6:12 PM

## 2018-10-19 LAB — GLUCOSE, CAPILLARY: Glucose-Capillary: 76 mg/dL (ref 70–99)

## 2018-10-19 MED ORDER — AMIODARONE HCL 200 MG PO TABS: 100.0000 mg | ORAL_TABLET | Freq: Every day | ORAL | Status: DC

## 2018-10-19 MED ORDER — DILTIAZEM HCL 30 MG PO TABS: 30.0000 mg | ORAL_TABLET | Freq: Two times a day (BID) | ORAL | 1 refills | Status: DC

## 2018-10-19 NOTE — Discharge Summary (Signed)
Physician Discharge Summary  Patient ID: Abigail [name] MRN: [id number] DOB/AGE: 1946-05-12 72 y.o.  Admit date: 10/15/2018 Discharge date: 10/19/2018  Admission Diagnoses: Dehydration Atrial fibrillation Anemia of chronic disease CAD S/P stent in LAD and LCx Multiple myeloma with chemotherapy Abnormal weight loss H/O DVT CKD, III Hypertension Moderate MR and TR  Discharge Diagnoses:  Principle problem: Dehydration Active Problems:   Adverse effect of chemotherapy agent   Atrial fibrillation, chronic permanent, CHA2DS2VASc score of 3   CAD   S/P stents in LAD and LCx   H/O DVT   Anemia of chronic disease and GI bleed   Chronic gastritis   Multiple myeloma with chemotherapy   Abnormal weight loss   CKD, III   Hypertension   Moderate MR and TR   Myasthenia gravis  Discharged Condition: stable  Hospital Course: 72 year old female was placed in observation for dehydration. She had decreased appetite post chemotherapy and had significant weight loss in 1 month. After IV fluids she remained orthostatic and dizzy with ambulation. Her Eliquis is held due to nausea and decreased Hgb. Her oral intake marginally improved over next 2 days and she was discharged home in stable condition with follow up by primary care in 1 week and by me in 1 month. She will see her oncologist as arranged. She will have home PT for dizziness with ambulation.  Consults: cardiology  Significant Diagnostic Studies: labs: Low WBC count. Hgb 9 g on admission and 8.1 g 48 hours post hydration. Normal platelets count. Normal electrolytes and Creatinine of 1.32 mg.  UA essentially unremarkable with trace ketones.  X-ray Abdomen : Unremarkable.  Treatments: IV hydration.  Discharge Exam: Blood pressure (!) 147/67, pulse 61, temperature 98.9 F (37.2 C), temperature source Oral, resp. rate 18, height '5\' 5"'$  (1.651 m), weight 68.1 kg, SpO2 98 %. General appearance: alert, cooperative and appears stated  age. Head: Normocephalic, atraumatic. Eyes: Brown eyes, pale conjunctiva, corneas clear. PERRL, EOM's intact.  Neck: No adenopathy, no carotid bruit, no JVD, supple, symmetrical, trachea midline and thyroid not enlarged. Resp: Clear to auscultation bilaterally. Cardio: Regular rate and rhythm, S1, S2 normal, II/VI systolic murmur, no click, rub or gallop. GI: Soft, non-tender; bowel sounds normal; no organomegaly. Extremities: No edema, cyanosis or clubbing. Skin: Warm and dry.  Neurologic: Alert and oriented X 3, normal strength and tone. Normal coordination and slow gait with walker.  Disposition: Discharge disposition: 01-Home or Self Care        Allergies as of 10/19/2018      Reactions   Sulfa Antibiotics Rash      Medication List    STOP taking these medications   apixaban 2.5 MG Tabs tablet Commonly known as:  ELIQUIS     TAKE these medications   acyclovir 400 MG tablet Commonly known as:  ZOVIRAX TAKE 1 TABLET (400 MG TOTAL) BY MOUTH 2 (TWO) TIMES DAILY.   amiodarone 200 MG tablet Commonly known as:  PACERONE Take 0.5 tablets (100 mg total) by mouth daily. What changed:  how much to take   atorvastatin 80 MG tablet Commonly known as:  LIPITOR Take 0.5 tablets (40 mg total) by mouth daily at 6 PM.   calcium carbonate 1250 (500 Ca) MG tablet Commonly known as:  OS-CAL - dosed in mg of elemental calcium Take 1 tablet by mouth daily with breakfast.   cyclophosphamide 50 MG capsule Commonly known as:  CYTOXAN Take 10 capsules ('500mg'$ ) by mouth once weekly for 3 weeks on,  1 week off, repeat every 4 weeks. Take early in the day. Maintain hydration.   diltiazem 30 MG tablet Commonly known as:  CARDIZEM Take 1 tablet (30 mg total) by mouth every 12 (twelve) hours.   feeding supplement (ENSURE ENLIVE) Liqd Take 237 mLs by mouth 2 (two) times daily between meals. What changed:    when to take this  additional instructions   ferrous sulfate 325 (65 FE) MG  tablet Take 1 tablet (325 mg total) by mouth daily with breakfast.   HYDROcodone-acetaminophen 5-325 MG tablet Commonly known as:  NORCO/VICODIN Take 1 tablet by mouth 2 (two) times daily as needed for moderate pain.   K-TAB 20 MEQ Tbcr Generic drug:  Potassium Chloride ER Take 20 mEq by mouth daily.   nitroGLYCERIN 0.4 MG SL tablet Commonly known as:  NITROSTAT Place 0.4 mg under the tongue every 5 (five) minutes as needed for chest pain.   pantoprazole 40 MG tablet Commonly known as:  PROTONIX Take 1 tablet (40 mg total) by mouth 2 (two) times daily before a meal.   pyridostigmine 60 MG tablet Commonly known as:  MESTINON Take 0.5 tablets (30 mg total) by mouth 2 (two) times daily.   tiZANidine 4 MG tablet Commonly known as:  ZANAFLEX Take 4 mg by mouth at bedtime as needed for muscle spasms.      [facility] Follow up.   Why:  They will do your home health care at your home Contact information: [address] [place] [address] [phone]        Nolene Ebbs, MD. Schedule an appointment as soon as possible for a visit in 1 week(s).   Specialty:  Internal Medicine Contact information: [place] [address] [phone]        [name], MD. Schedule an appointment as soon as possible for a visit in 1 month(s).   Specialty:  Cardiology Contact information: [place] [address] [phone]           Signed: Birdie Riddle 10/19/2018, 8:54 AM

## 2018-10-19 NOTE — Progress Notes (Signed)
Pt has orders to be discharged. Discharge instructions given and pt has no additional questions at this time. Medication regimen reviewed and pt educated. Pt verbalized understanding and has no additional questions. Telemetry box removed. IV removed and site in good condition. Pt stable and waiting for transportation. 

## 2018-10-19 NOTE — Plan of Care (Signed)
  Problem: Education: Goal: Ability to demonstrate management of disease process will improve 10/19/2018 0105 by Orvilla Fus, RN Outcome: Progressing 10/19/2018 0105 by Orvilla Fus, RN Outcome: Progressing Goal: Ability to verbalize understanding of medication therapies will improve 10/19/2018 0105 by Orvilla Fus, RN Outcome: Progressing 10/19/2018 0105 by Orvilla Fus, RN Outcome: Progressing Goal: Individualized Educational Video(s) 10/19/2018 0105 by Orvilla Fus, RN Outcome: Progressing 10/19/2018 0105 by Orvilla Fus, RN Outcome: Progressing   Problem: Activity: Goal: Capacity to carry out activities will improve 10/19/2018 0105 by Orvilla Fus, RN Outcome: Progressing 10/19/2018 0105 by Orvilla Fus, RN Outcome: Progressing   Problem: Cardiac: Goal: Ability to achieve and maintain adequate cardiopulmonary perfusion will improve 10/19/2018 0105 by Orvilla Fus, RN Outcome: Progressing 10/19/2018 0105 by Orvilla Fus, RN Outcome: Progressing

## 2018-10-19 NOTE — Plan of Care (Deleted)
  Problem: Education: Goal: Ability to demonstrate management of disease process will improve 10/19/2018 0105 by Orvilla Fus, RN Outcome: Progressing 10/19/2018 0105 by Orvilla Fus, RN Outcome: Progressing 10/19/2018 0105 by Orvilla Fus, RN Outcome: Progressing Goal: Ability to verbalize understanding of medication therapies will improve 10/19/2018 0105 by Orvilla Fus, RN Outcome: Progressing 10/19/2018 0105 by Orvilla Fus, RN Outcome: Progressing 10/19/2018 0105 by Orvilla Fus, RN Outcome: Progressing Goal: Individualized Educational Video(s) 10/19/2018 0105 by Orvilla Fus, RN Outcome: Progressing 10/19/2018 0105 by Orvilla Fus, RN Outcome: Progressing 10/19/2018 0105 by Orvilla Fus, RN Outcome: Progressing   Problem: Activity: Goal: Capacity to carry out activities will improve 10/19/2018 0105 by Orvilla Fus, RN Outcome: Progressing 10/19/2018 0105 by Orvilla Fus, RN Outcome: Progressing 10/19/2018 0105 by Orvilla Fus, RN Outcome: Progressing   Problem: Cardiac: Goal: Ability to achieve and maintain adequate cardiopulmonary perfusion will improve 10/19/2018 0105 by Orvilla Fus, RN Outcome: Progressing 10/19/2018 0105 by Orvilla Fus, RN Outcome: Progressing 10/19/2018 0105 by Orvilla Fus, RN Outcome: Progressing

## 2018-10-21 ENCOUNTER — Other Ambulatory Visit: Payer: Self-pay | Admitting: *Deleted

## 2018-10-21 ENCOUNTER — Inpatient Hospital Stay: Payer: Medicare HMO | Attending: Nurse Practitioner | Admitting: Oncology

## 2018-10-21 ENCOUNTER — Inpatient Hospital Stay: Payer: Medicare HMO

## 2018-10-21 VITALS — BP 112/72 | HR 51 | Temp 98.5°F | Resp 18 | Ht 65.0 in | Wt 148.9 lb

## 2018-10-21 DIAGNOSIS — I4891 Unspecified atrial fibrillation: Secondary | ICD-10-CM | POA: Diagnosis not present

## 2018-10-21 DIAGNOSIS — I2510 Atherosclerotic heart disease of native coronary artery without angina pectoris: Secondary | ICD-10-CM | POA: Diagnosis not present

## 2018-10-21 DIAGNOSIS — R634 Abnormal weight loss: Secondary | ICD-10-CM

## 2018-10-21 DIAGNOSIS — Z7901 Long term (current) use of anticoagulants: Secondary | ICD-10-CM | POA: Insufficient documentation

## 2018-10-21 DIAGNOSIS — Z79899 Other long term (current) drug therapy: Secondary | ICD-10-CM | POA: Diagnosis not present

## 2018-10-21 DIAGNOSIS — C9000 Multiple myeloma not having achieved remission: Secondary | ICD-10-CM | POA: Insufficient documentation

## 2018-10-21 DIAGNOSIS — D630 Anemia in neoplastic disease: Secondary | ICD-10-CM | POA: Diagnosis not present

## 2018-10-21 DIAGNOSIS — Z86718 Personal history of other venous thrombosis and embolism: Secondary | ICD-10-CM | POA: Insufficient documentation

## 2018-10-21 DIAGNOSIS — C9002 Multiple myeloma in relapse: Secondary | ICD-10-CM

## 2018-10-21 DIAGNOSIS — I252 Old myocardial infarction: Secondary | ICD-10-CM | POA: Diagnosis not present

## 2018-10-21 DIAGNOSIS — D649 Anemia, unspecified: Secondary | ICD-10-CM | POA: Diagnosis not present

## 2018-10-21 DIAGNOSIS — Z86010 Personal history of colonic polyps: Secondary | ICD-10-CM | POA: Insufficient documentation

## 2018-10-21 DIAGNOSIS — Z9221 Personal history of antineoplastic chemotherapy: Secondary | ICD-10-CM | POA: Diagnosis not present

## 2018-10-21 LAB — CBC WITH DIFFERENTIAL (CANCER CENTER ONLY)
Abs Immature Granulocytes: 0.04 10*3/uL (ref 0.00–0.07)
Basophils Absolute: 0 10*3/uL (ref 0.0–0.1)
Basophils Relative: 1 %
Eosinophils Absolute: 0 10*3/uL (ref 0.0–0.5)
Eosinophils Relative: 1 %
HCT: 30.2 % — ABNORMAL LOW (ref 36.0–46.0)
Hemoglobin: 9.1 g/dL — ABNORMAL LOW (ref 12.0–15.0)
Immature Granulocytes: 1 %
Lymphocytes Relative: 13 %
Lymphs Abs: 0.5 10*3/uL — ABNORMAL LOW (ref 0.7–4.0)
MCH: 29.4 pg (ref 26.0–34.0)
MCHC: 30.1 g/dL (ref 30.0–36.0)
MCV: 97.7 fL (ref 80.0–100.0)
Monocytes Absolute: 0.6 10*3/uL (ref 0.1–1.0)
Monocytes Relative: 15 %
Neutro Abs: 2.9 10*3/uL (ref 1.7–7.7)
Neutrophils Relative %: 69 %
Platelet Count: 271 10*3/uL (ref 150–400)
RBC: 3.09 MIL/uL — ABNORMAL LOW (ref 3.87–5.11)
RDW: 20 % — AB (ref 11.5–15.5)
WBC Count: 4.2 10*3/uL (ref 4.0–10.5)
nRBC: 0 % (ref 0.0–0.2)

## 2018-10-21 LAB — BASIC METABOLIC PANEL - CANCER CENTER ONLY
Anion gap: 11 (ref 5–15)
BUN: 19 mg/dL (ref 8–23)
CO2: 19 mmol/L — ABNORMAL LOW (ref 22–32)
Calcium: 9.1 mg/dL (ref 8.9–10.3)
Chloride: 110 mmol/L (ref 98–111)
Creatinine: 1.97 mg/dL — ABNORMAL HIGH (ref 0.44–1.00)
GFR, EST AFRICAN AMERICAN: 29 mL/min — AB (ref >60)
GFR, EST NON AFRICAN AMERICAN: 25 mL/min — AB (ref >60)
Glucose, Bld: 115 mg/dL — ABNORMAL HIGH (ref 70–99)
Potassium: 3.7 mmol/L (ref 3.5–5.1)
SODIUM: 140 mmol/L (ref 135–145)

## 2018-10-21 MED ORDER — CYCLOPHOSPHAMIDE 50 MG PO CAPS: ORAL_CAPSULE | ORAL | 0 refills | Status: DC

## 2018-10-21 MED ORDER — DEXAMETHASONE 4 MG PO TABS: ORAL_TABLET | ORAL | 1 refills | Status: DC

## 2018-10-21 MED ORDER — POTASSIUM CHLORIDE ER 20 MEQ PO TBCR: 20.0000 meq | EXTENDED_RELEASE_TABLET | Freq: Every day | ORAL | 2 refills | Status: DC

## 2018-10-21 NOTE — Progress Notes (Signed)
[facility] OFFICE PROGRESS NOTE   Diagnosis: Multiple myeloma  INTERVAL HISTORY:   Abigail [name] returns as scheduled.  She was again admitted with atrial fibrillation 10/15/2018.  She was discharged on 10/19/2018.  She continues to feel weak.  Her chief complaint is left abdominal pain.  No nausea/vomiting or difficulty with bowel function.  Objective:  Vital signs in last 24 hours:  Blood pressure 112/72, pulse (!) 51, temperature 98.5 F (36.9 C), temperature source Oral, resp. rate 18, height _0  (1.651 m), weight 148 lb 14.4 oz (67.5 kg), SpO2 99 %.    HEENT: No thrush or ulcers Resp: Lungs clear bilaterally Cardio: Regular rate and rhythm GI: No hepatosplenomegaly, no mass, tender in the left mid and lateral abdomen Vascular: No leg edema     Lab Results:  Lab Results  Component Value Date   WBC 4.2 10/21/2018   HGB 9.1 (L) 10/21/2018   HCT 30.2 (L) 10/21/2018   MCV 97.7 10/21/2018   PLT 271 10/21/2018   NEUTROABS 2.9 10/21/2018    CMP  Lab Results  Component Value Date   NA 140 10/21/2018   K 3.7 10/21/2018   CL 110 10/21/2018   CO2 19 (L) 10/21/2018   GLUCOSE 115 (H) 10/21/2018   BUN 19 10/21/2018   CREATININE 1.97 (H) 10/21/2018   CALCIUM 9.1 10/21/2018   PROT 6.7 10/15/2018   ALBUMIN 3.6 10/15/2018   AST 16 10/15/2018   ALT 10 10/15/2018   ALKPHOS 50 10/15/2018   BILITOT 0.8 10/15/2018   GFRNONAA 25 (L) 10/21/2018   GFRAA 29 (L) 10/21/2018     Medications: I have reviewed the patient's current medications.   Assessment/Plan: 1. Multiple myeloma, IgG lambda, bone marrow biopsy 06/15/2016 confirmed multiple myeloma; cytogenetics by FISHshow +11, +12, 13q-  Elevated serum free lambda light chains  Lambda light chain proteinuria  Lytic bone lesions on a bone survey 06/13/2016  Initiation of weekly Velcade/Decadron 06/19/2016  Serum light chains improved 07/31/2016  Initiation of Revlimid 11/03/20172 weeks on/2 weeks  off  Serum M spike and IgG significantly improved 09/25/2016  Velcade held on 10/30/2016 due to urinary retention  Treatment resumed with Revlimid (2 weeks on/2 weeks off) and weekly Decadron on 02/12/2017, treatment placed on hold May 2018 secondary to admissions with cardiac disease  05/27/2018-increased serum M spike, IgG, serum lambda light chains  Bone survey 05/31/2018-widespread myeloma without detectable progression since 06/13/2016.  Cycle 1 Cytoxan/Velcade/Decadron 06/24/2018  Cycle 2 Cytoxan/Velcade/Decadron 07/22/2018  08/09/2018 serum light chains, M spike, IgGimproved  Cycle for Cytoxan/Velcade/Decadron 09/24/2018 2. Severe anemia secondary to #1-markedly improved  3. Diffuse lytic bone lesions secondary to multiple myeloma, status post Zometa 09/18/2016(plan to continue every 3 months);most recent Zometa infusion 10/08/2017  4. History of coronary artery disease/myocardial infarction  5. History of colon polyps  6. Bilateral leg edema 09/04/2016. Negative bilateral venous Doppler 09/05/2016.  7. Mild periorbital edema 09/18/2016, question related to early stye formation, question Velcade related chalazia.Improved.  8. CT scan 10/27/2016 with a new right kidney massevaluated by urology; MRI abdomen 11/18/2016 multiple bilateral renal cysts. Most of these are simple cysts. The 2 lesions in question on the CT scan are both hemorrhagic or proteinaceous cysts. No worrisome contrast enhancement.  9. Urinary retention 10/28/2016 status post evaluation in the emergency department. Urinalysis negative for signs of infection 10/30/2016. Velcade held. Resolved.  10. Abdominal pain, nausea, vomiting.Etiology unclear. Upper endoscopy 11/18/2016 with findings of reflux esophagitis, benign appearing esophageal stenosis, small hiatal hernia  and erythematous duodenopathy. CT abdomen/pelvis 11/19/2016 with no acute abnormality.  11. Multivessel CAD status post LAD and  diagonal vessel stent placement  12. Left lower extremity DVT May 2018.Maintainedon Coumadin.Coumadin discontinued October 2018 secondary to a supratherapeutic PT/INR and GI bleeding  13.Admission with GI bleeding October and November 2018-felt secondary to diverticular bleeding, initially while on supratherapeutic Coumadin, colonoscopy 09/04/2017-no source of blood loss identified, superficial tear at the posterior anal mucosa without stigmata of bleeding  14. Acute creatinine elevation 3.26 on 08/05/2018 of cycle 2 day 15; confirmed on repeat 3.13 on 08/06/18; improved  15.  Admission 1Dec 28, 202019 with rapid atrial fibrillation, now maintained on amiodarone 16.  Admission 10/15/2018 with atrial fibrillation  Abigail [name] has multiple myeloma.  She has persistent abdominal pain of unclear etiology.  She is scheduled to see Dr. Loletha Carrow on 10/26/2018.  She underwent a colonoscopy last year.  A recent CT showed no explanation for the pain.  There was mild distal gastric wall thickening.  She is losing weight.  The creatinine is more elevated today.  Chemotherapy will be held today.  She will return for an office visit with the plan to initiate another cycle of Cytoxan/Velcade/Decadron on 10/28/2018.  Chemotherapy will remain on hold if her performance status has not improved.   We are attempting to inform the indication for Mestinon with her cardiologist.  [name], MD  10/21/2018  1:47 PM

## 2018-10-22 ENCOUNTER — Encounter (HOSPITAL_COMMUNITY): Payer: Self-pay | Admitting: Acute Care

## 2018-10-22 ENCOUNTER — Emergency Department (HOSPITAL_COMMUNITY): Payer: Medicare HMO

## 2018-10-22 ENCOUNTER — Other Ambulatory Visit (HOSPITAL_COMMUNITY): Payer: Self-pay

## 2018-10-22 ENCOUNTER — Other Ambulatory Visit: Payer: Self-pay

## 2018-10-22 ENCOUNTER — Inpatient Hospital Stay (HOSPITAL_COMMUNITY)
Admission: EM | Admit: 2018-10-22 | Discharge: 2018-11-02 | DRG: 871 | Disposition: A | Discharge status: Home / Self Care | Payer: Medicare HMO | Attending: Internal Medicine | Admitting: Internal Medicine

## 2018-10-22 DIAGNOSIS — I959 Hypotension, unspecified: Secondary | ICD-10-CM | POA: Diagnosis not present

## 2018-10-22 DIAGNOSIS — E43 Unspecified severe protein-calorie malnutrition: Secondary | ICD-10-CM | POA: Diagnosis present

## 2018-10-22 DIAGNOSIS — E872 Acidosis: Secondary | ICD-10-CM

## 2018-10-22 DIAGNOSIS — N179 Acute kidney failure, unspecified: Secondary | ICD-10-CM | POA: Diagnosis present

## 2018-10-22 DIAGNOSIS — R001 Bradycardia, unspecified: Secondary | ICD-10-CM | POA: Diagnosis not present

## 2018-10-22 DIAGNOSIS — K529 Noninfective gastroenteritis and colitis, unspecified: Secondary | ICD-10-CM | POA: Diagnosis not present

## 2018-10-22 DIAGNOSIS — A419 Sepsis, unspecified organism: Secondary | ICD-10-CM | POA: Diagnosis present

## 2018-10-22 DIAGNOSIS — I495 Sick sinus syndrome: Secondary | ICD-10-CM | POA: Diagnosis present

## 2018-10-22 DIAGNOSIS — I4821 Permanent atrial fibrillation: Secondary | ICD-10-CM | POA: Diagnosis present

## 2018-10-22 DIAGNOSIS — Z66 Do not resuscitate: Secondary | ICD-10-CM | POA: Diagnosis present

## 2018-10-22 DIAGNOSIS — I130 Hypertensive heart and chronic kidney disease with heart failure and stage 1 through stage 4 chronic kidney disease, or unspecified chronic kidney disease: Secondary | ICD-10-CM | POA: Diagnosis present

## 2018-10-22 DIAGNOSIS — R6521 Severe sepsis with septic shock: Secondary | ICD-10-CM

## 2018-10-22 DIAGNOSIS — B370 Candidal stomatitis: Secondary | ICD-10-CM | POA: Diagnosis present

## 2018-10-22 DIAGNOSIS — C9002 Multiple myeloma in relapse: Secondary | ICD-10-CM | POA: Diagnosis present

## 2018-10-22 DIAGNOSIS — I480 Paroxysmal atrial fibrillation: Secondary | ICD-10-CM | POA: Diagnosis not present

## 2018-10-22 DIAGNOSIS — E785 Hyperlipidemia, unspecified: Secondary | ICD-10-CM | POA: Diagnosis present

## 2018-10-22 DIAGNOSIS — A09 Infectious gastroenteritis and colitis, unspecified: Secondary | ICD-10-CM | POA: Diagnosis present

## 2018-10-22 DIAGNOSIS — E860 Dehydration: Secondary | ICD-10-CM | POA: Diagnosis present

## 2018-10-22 DIAGNOSIS — R197 Diarrhea, unspecified: Secondary | ICD-10-CM | POA: Diagnosis not present

## 2018-10-22 DIAGNOSIS — R579 Shock, unspecified: Secondary | ICD-10-CM | POA: Diagnosis not present

## 2018-10-22 DIAGNOSIS — E870 Hyperosmolality and hypernatremia: Secondary | ICD-10-CM | POA: Diagnosis present

## 2018-10-22 DIAGNOSIS — I4581 Long QT syndrome: Secondary | ICD-10-CM | POA: Diagnosis present

## 2018-10-22 DIAGNOSIS — I10 Essential (primary) hypertension: Secondary | ICD-10-CM

## 2018-10-22 DIAGNOSIS — I2510 Atherosclerotic heart disease of native coronary artery without angina pectoris: Secondary | ICD-10-CM | POA: Diagnosis present

## 2018-10-22 DIAGNOSIS — I248 Other forms of acute ischemic heart disease: Secondary | ICD-10-CM | POA: Diagnosis present

## 2018-10-22 DIAGNOSIS — I5023 Acute on chronic systolic (congestive) heart failure: Secondary | ICD-10-CM | POA: Diagnosis present

## 2018-10-22 DIAGNOSIS — C9000 Multiple myeloma not having achieved remission: Secondary | ICD-10-CM

## 2018-10-22 DIAGNOSIS — I252 Old myocardial infarction: Secondary | ICD-10-CM | POA: Diagnosis not present

## 2018-10-22 DIAGNOSIS — I471 Supraventricular tachycardia: Secondary | ICD-10-CM | POA: Diagnosis present

## 2018-10-22 DIAGNOSIS — R112 Nausea with vomiting, unspecified: Secondary | ICD-10-CM | POA: Diagnosis not present

## 2018-10-22 DIAGNOSIS — K559 Vascular disorder of intestine, unspecified: Secondary | ICD-10-CM | POA: Diagnosis present

## 2018-10-22 DIAGNOSIS — J9600 Acute respiratory failure, unspecified whether with hypoxia or hypercapnia: Secondary | ICD-10-CM

## 2018-10-22 DIAGNOSIS — N189 Chronic kidney disease, unspecified: Secondary | ICD-10-CM

## 2018-10-22 DIAGNOSIS — K219 Gastro-esophageal reflux disease without esophagitis: Secondary | ICD-10-CM | POA: Diagnosis present

## 2018-10-22 DIAGNOSIS — N183 Chronic kidney disease, stage 3 (moderate): Secondary | ICD-10-CM | POA: Diagnosis present

## 2018-10-22 LAB — COMPREHENSIVE METABOLIC PANEL
ALT: 11 U/L (ref 0–44)
ANION GAP: 13 (ref 5–15)
AST: 26 U/L (ref 15–41)
Albumin: 4 g/dL (ref 3.5–5.0)
Alkaline Phosphatase: 60 U/L (ref 38–126)
BUN: 27 mg/dL — ABNORMAL HIGH (ref 8–23)
CO2: 18 mmol/L — ABNORMAL LOW (ref 22–32)
Calcium: 8.9 mg/dL (ref 8.9–10.3)
Chloride: 105 mmol/L (ref 98–111)
Creatinine, Ser: 3.2 mg/dL — ABNORMAL HIGH (ref 0.44–1.00)
GFR calc non Af Amer: 14 mL/min — ABNORMAL LOW (ref >60)
GFR, EST AFRICAN AMERICAN: 16 mL/min — AB (ref >60)
Glucose, Bld: 137 mg/dL — ABNORMAL HIGH (ref 70–99)
Potassium: 3.5 mmol/L (ref 3.5–5.1)
Sodium: 136 mmol/L (ref 135–145)
Total Bilirubin: 0.9 mg/dL (ref 0.3–1.2)
Total Protein: 7.7 g/dL (ref 6.5–8.1)

## 2018-10-22 LAB — CBC WITH DIFFERENTIAL/PLATELET
Abs Immature Granulocytes: 0.14 10*3/uL — ABNORMAL HIGH (ref 0.00–0.07)
Basophils Absolute: 0.1 10*3/uL (ref 0.0–0.1)
Basophils Relative: 0 %
EOS ABS: 0 10*3/uL (ref 0.0–0.5)
Eosinophils Relative: 0 %
HCT: 33.9 % — ABNORMAL LOW (ref 36.0–46.0)
Hemoglobin: 10 g/dL — ABNORMAL LOW (ref 12.0–15.0)
Immature Granulocytes: 1 %
Lymphocytes Relative: 2 %
Lymphs Abs: 0.3 10*3/uL — ABNORMAL LOW (ref 0.7–4.0)
MCH: 28.9 pg (ref 26.0–34.0)
MCHC: 29.5 g/dL — ABNORMAL LOW (ref 30.0–36.0)
MCV: 98 fL (ref 80.0–100.0)
Monocytes Absolute: 1.5 10*3/uL — ABNORMAL HIGH (ref 0.1–1.0)
Monocytes Relative: 11 %
Neutro Abs: 12 10*3/uL — ABNORMAL HIGH (ref 1.7–7.7)
Neutrophils Relative %: 86 %
Platelets: 323 10*3/uL (ref 150–400)
RBC: 3.46 MIL/uL — ABNORMAL LOW (ref 3.87–5.11)
RDW: 20.3 % — ABNORMAL HIGH (ref 11.5–15.5)
WBC: 13.7 10*3/uL — AB (ref 4.0–10.5)
nRBC: 0 % (ref 0.0–0.2)

## 2018-10-22 LAB — URINALYSIS, ROUTINE W REFLEX MICROSCOPIC
Glucose, UA: NEGATIVE mg/dL
Hgb urine dipstick: NEGATIVE
Leukocytes, UA: NEGATIVE
Nitrite: NEGATIVE
Protein, ur: NEGATIVE mg/dL
Specific Gravity, Urine: >1.03 — ABNORMAL HIGH (ref 1.005–1.030)
pH: 5 (ref 5.0–8.0)

## 2018-10-22 LAB — I-STAT CHEM 8, ED
BUN: 26 mg/dL — ABNORMAL HIGH (ref 8–23)
Calcium, Ion: 1.19 mmol/L (ref 1.15–1.40)
Chloride: 111 mmol/L (ref 98–111)
Creatinine, Ser: 3.2 mg/dL — ABNORMAL HIGH (ref 0.44–1.00)
Glucose, Bld: 133 mg/dL — ABNORMAL HIGH (ref 70–99)
HEMATOCRIT: 34 % — AB (ref 36.0–46.0)
Hemoglobin: 11.6 g/dL — ABNORMAL LOW (ref 12.0–15.0)
Potassium: 3.6 mmol/L (ref 3.5–5.1)
SODIUM: 140 mmol/L (ref 135–145)
TCO2: 20 mmol/L — ABNORMAL LOW (ref 22–32)

## 2018-10-22 LAB — LACTIC ACID, PLASMA
Lactic Acid, Venous: 3.7 mmol/L (ref 0.5–1.9)
Lactic Acid, Venous: 5.4 mmol/L (ref 0.5–1.9)
Lactic Acid, Venous: 2.1 mmol/L (ref 0.5–1.9)

## 2018-10-22 LAB — I-STAT TROPONIN, ED: Troponin i, poc: 0.08 ng/mL (ref 0.00–0.08)

## 2018-10-22 LAB — TROPONIN I
Troponin I: 0.11 ng/mL (ref <0.03)
Troponin I: 0.18 ng/mL (ref <0.03)

## 2018-10-22 LAB — PROCALCITONIN
PROCALCITONIN: 2.27 ng/mL
Procalcitonin: 1.69 ng/mL

## 2018-10-22 LAB — SAMPLE TO BLOOD BANK

## 2018-10-22 LAB — MRSA PCR SCREENING: MRSA by PCR: NEGATIVE

## 2018-10-22 LAB — I-STAT CG4 LACTIC ACID, ED: Lactic Acid, Venous: 3.23 mmol/L (ref 0.5–1.9)

## 2018-10-22 LAB — CREATININE, URINE, RANDOM: CREATININE, URINE: 216.43 mg/dL

## 2018-10-22 LAB — SODIUM, URINE, RANDOM: Sodium, Ur: 25 mmol/L

## 2018-10-22 LAB — MAGNESIUM: MAGNESIUM: 1.7 mg/dL (ref 1.7–2.4)

## 2018-10-22 LAB — LIPASE, BLOOD: Lipase: 36 U/L (ref 11–51)

## 2018-10-22 MED ORDER — VANCOMYCIN 50 MG/ML ORAL SOLUTION
125.0000 mg | Freq: Four times a day (QID) | ORAL | Status: DC
  Administered 2018-10-22 – 2018-10-23 (×7): 125 mg via ORAL
  Filled 2018-10-22 (×8): qty 2.5

## 2018-10-22 MED ORDER — ONDANSETRON HCL 4 MG/2ML IJ SOLN
INTRAMUSCULAR | Status: AC
  Administered 2018-10-22: 4 mg via INTRAVENOUS
  Filled 2018-10-22: qty 2

## 2018-10-22 MED ORDER — VANCOMYCIN VARIABLE DOSE PER UNSTABLE RENAL FUNCTION (PHARMACIST DOSING): Status: DC

## 2018-10-22 MED ORDER — OXYCODONE HCL 5 MG PO TABS
5.0000 mg | ORAL_TABLET | ORAL | Status: DC | PRN
  Administered 2018-10-22 – 2018-10-23 (×4): 5 mg via ORAL
  Filled 2018-10-22 (×4): qty 1

## 2018-10-22 MED ORDER — PANTOPRAZOLE SODIUM 40 MG PO TBEC
40.0000 mg | DELAYED_RELEASE_TABLET | Freq: Two times a day (BID) | ORAL | Status: DC
  Administered 2018-10-22 – 2018-11-02 (×22): 40 mg via ORAL
  Filled 2018-10-22 (×22): qty 1

## 2018-10-22 MED ORDER — ONDANSETRON HCL 4 MG/2ML IJ SOLN
4.0000 mg | Freq: Four times a day (QID) | INTRAMUSCULAR | Status: DC | PRN
  Administered 2018-10-22: 4 mg via INTRAVENOUS
  Filled 2018-10-22: qty 2

## 2018-10-22 MED ORDER — ACETAMINOPHEN 325 MG PO TABS
650.0000 mg | ORAL_TABLET | Freq: Four times a day (QID) | ORAL | Status: DC | PRN
  Administered 2018-10-22: 650 mg via ORAL
  Filled 2018-10-22: qty 2

## 2018-10-22 MED ORDER — PIPERACILLIN-TAZOBACTAM IN DEX 2-0.25 GM/50ML IV SOLN
2.2500 g | Freq: Three times a day (TID) | INTRAVENOUS | Status: DC
  Administered 2018-10-22 – 2018-10-25 (×8): 2.25 g via INTRAVENOUS
  Filled 2018-10-22 (×9): qty 50

## 2018-10-22 MED ORDER — SODIUM CHLORIDE 0.9 % IV BOLUS
500.0000 mL | Freq: Once | INTRAVENOUS | Status: AC
  Administered 2018-10-22: 500 mL via INTRAVENOUS

## 2018-10-22 MED ORDER — TIZANIDINE HCL 4 MG PO TABS: 4.0000 mg | ORAL_TABLET | Freq: Every evening | ORAL | Status: DC | PRN

## 2018-10-22 MED ORDER — SODIUM CHLORIDE 0.9 % IV SOLN
INTRAVENOUS | Status: DC
  Administered 2018-10-22 – 2018-10-26 (×5): via INTRAVENOUS

## 2018-10-22 MED ORDER — PYRIDOSTIGMINE BROMIDE 60 MG PO TABS
30.0000 mg | ORAL_TABLET | Freq: Two times a day (BID) | ORAL | Status: DC
  Administered 2018-10-22 – 2018-11-02 (×22): 30 mg via ORAL
  Filled 2018-10-22 (×24): qty 0.5

## 2018-10-22 MED ORDER — SODIUM CHLORIDE 0.9 % IV SOLN
2.0000 g | Freq: Once | INTRAVENOUS | Status: AC
  Administered 2018-10-22: 2 g via INTRAVENOUS
  Filled 2018-10-22: qty 2

## 2018-10-22 MED ORDER — VANCOMYCIN HCL IN DEXTROSE 1-5 GM/200ML-% IV SOLN
1000.0000 mg | Freq: Once | INTRAVENOUS | Status: AC
  Administered 2018-10-22: 1000 mg via INTRAVENOUS
  Filled 2018-10-22: qty 200

## 2018-10-22 MED ORDER — ONDANSETRON HCL 4 MG/2ML IJ SOLN
4.0000 mg | Freq: Once | INTRAMUSCULAR | Status: AC
  Administered 2018-10-22: 4 mg via INTRAVENOUS

## 2018-10-22 MED ORDER — ACYCLOVIR 400 MG PO TABS
400.0000 mg | ORAL_TABLET | Freq: Two times a day (BID) | ORAL | Status: DC
  Administered 2018-10-22 – 2018-11-02 (×22): 400 mg via ORAL
  Filled 2018-10-22 (×24): qty 1

## 2018-10-22 MED ORDER — ATORVASTATIN CALCIUM 40 MG PO TABS
40.0000 mg | ORAL_TABLET | Freq: Every day | ORAL | Status: DC
  Administered 2018-10-22 – 2018-11-01 (×11): 40 mg via ORAL
  Filled 2018-10-22 (×11): qty 1

## 2018-10-22 MED ORDER — LACTATED RINGERS IV BOLUS
1000.0000 mL | Freq: Once | INTRAVENOUS | Status: AC
  Administered 2018-10-22: 1000 mL via INTRAVENOUS

## 2018-10-22 MED ORDER — SODIUM CHLORIDE 0.9 % IV BOLUS
1000.0000 mL | Freq: Once | INTRAVENOUS | Status: AC
  Administered 2018-10-22: 1000 mL via INTRAVENOUS

## 2018-10-22 MED ORDER — MAGNESIUM SULFATE 2 GM/50ML IV SOLN
2.0000 g | Freq: Once | INTRAVENOUS | Status: AC
  Administered 2018-10-22: 2 g via INTRAVENOUS
  Filled 2018-10-22: qty 50

## 2018-10-22 MED ORDER — SODIUM CHLORIDE 0.9 % IV SOLN
Freq: Once | INTRAVENOUS | Status: AC
  Administered 2018-10-22: 09:00:00 via INTRAVENOUS

## 2018-10-22 MED ORDER — FERROUS SULFATE 325 (65 FE) MG PO TABS
325.0000 mg | ORAL_TABLET | Freq: Every day | ORAL | Status: DC
  Administered 2018-10-23 – 2018-11-02 (×11): 325 mg via ORAL
  Filled 2018-10-22 (×11): qty 1

## 2018-10-22 MED ORDER — ACETAMINOPHEN 650 MG RE SUPP: 650.0000 mg | Freq: Four times a day (QID) | RECTAL | Status: DC | PRN

## 2018-10-22 MED ORDER — ENOXAPARIN SODIUM 30 MG/0.3ML ~~LOC~~ SOLN
30.0000 mg | SUBCUTANEOUS | Status: DC
  Administered 2018-10-22 – 2018-10-26 (×5): 30 mg via SUBCUTANEOUS
  Filled 2018-10-22 (×5): qty 0.3

## 2018-10-22 NOTE — ED Notes (Signed)
This RN went into patient room to transport patient upstairs, when I noted patient to be in SVT. EKG completed and exported. This RN instructed patient to complete vagal maneuvers and patient converted back to sinus bradycardia. Dr Loleta Books made aware. EKG of bradycardia completed.

## 2018-10-22 NOTE — Progress Notes (Signed)
Critical LA of 5.4 notified by Allison,RN to the MD. Orders to repeat labs at 8pm & give 1 L bolus of NS given.  F/u  BP 113/85 (91)

## 2018-10-22 NOTE — ED Notes (Signed)
ED TO INPATIENT HANDOFF REPORT  Name/Age/Gender Abigail [name] 73 y.o. female  Code Status    Code Status Orders  (From admission, onward)         Start     Ordered   10/22/18 1228  Do not attempt resuscitation (DNR)  Continuous    Question Answer Comment  In the event of cardiac or respiratory ARREST Do not call a "code blue"   In the event of cardiac or respiratory ARREST Do not perform Intubation, CPR, defibrillation or ACLS   In the event of cardiac or respiratory ARREST Use medication by any route, position, wound care, and other measures to relive pain and suffering. May use oxygen, suction and manual treatment of airway obstruction as needed for comfort.      10/22/18 1227        Code Status History    Date Active Date Inactive Code Status Order ID Comments User Context   10/15/2018 1335 10/19/2018 1448 Full Code [id number]  [name], MD Inpatient   10/08/2018 1009 10/11/2018 1821 Full Code [id number]  Charolette Forward, MD Inpatient   [id number] 1639 10/03/2018 1856 Full Code [id number]  Charolette Forward, MD ED   09/15/2018 2244 09/17/2018 2100 Full Code [id number]  [name], MD ED   09/01/2018 1858 09/03/2018 1857 Full Code [id number]  [name], MD ED   12/14/2017 0459 12/17/2017 1558 Full Code [id number]  [name], MD ED   09/03/2017 0716 09/05/2017 1808 Full Code [id number]  Samella Parr, NP ED   08/16/2017 2334 08/21/2017 1905 Full Code [id number]  Karmen Bongo, MD Inpatient   06/23/2017 1213 06/25/2017 1516 Full Code [id number]  [name], MD ED   04/02/2017 0956 04/08/2017 1901 Full Code [id number]  [name], MD ED   03/04/2017 0808 03/12/2017 1728 Full Code [id number]  [name], MD ED   02/24/2017 2334 03/01/2017 1931 Full Code [id number]  Charolette Forward, MD Inpatient   02/24/2017 2333 02/24/2017 2334 Full Code [id number]  Doylene Canning, MD Inpatient   01/21/2017 1738 01/25/2017 1834 Full Code [id number]  [name], MD Inpatient   12/29/2016 1415  01/02/2017 2239 Full Code [id number]  Charolette Forward, MD Inpatient   12/22/2016 1952 12/29/2016 1415 Full Code [id number]  [name], MD Inpatient   12/06/2016 1956 12/09/2016 2008 Full Code [id number]  Rosita Fire, MD Inpatient   11/23/2016 1644 11/27/2016 1819 Full Code [id number]  Elgergawy, Silver Huguenin, MD Inpatient   11/17/2016 1404 11/21/2016 2008 Full Code [id number]  Patrecia Pour, MD Inpatient   06/12/2016 1556 06/19/2016 2203 Full Code [id number]  [name], MD Inpatient   01/11/2016 1608 01/14/2016 1420 Full Code [id number]  [name], MD Inpatient   11/01/2015 1735 11/04/2015 1738 Full Code [id number]  [name], MD Inpatient   12/31/2011 0405 01/02/2012 1640 Full Code [id number]  Freddy Jaksch, RN Inpatient      Home/SNF/Other Home  Chief Complaint hypotension;ca pt  Level of Care/Admitting Diagnosis ED Disposition    ED Disposition Condition [facility] Area: [place] [[id number]]  Level of Care: Stepdown [14]  Admit to SDU based on following criteria: Hemodynamic compromise or significant risk of instability:  Patient requiring short term acute titration and management of vasoactive drips, and invasive monitoring (i.e., CVP and Arterial line).  Admit to SDU based on following criteria: Severe physiological/psychological symptoms:  Any diagnosis requiring assessment & intervention at least every 4 hours on an ongoing basis to obtain desired patient outcomes  including stability and rehabilitation  Diagnosis: Sepsis [facility]) [[id number]]  Admitting Physician: Edwin Dada [[id number]]  Attending Physician: Edwin Dada [[id number]]  Estimated length of stay: past midnight tomorrow  Certification:: I certify this patient will need inpatient services for at least 2 midnights  PT Class (Do Not Modify): Inpatient [101]  PT Acc Code (Do Not Modify): Private [1]       Medical History Past Medical History:  Diagnosis Date  . ACS  (acute coronary syndrome) ([place]) 06/2017  . Anemia    "off and on all my life" (12/22/2016)  . Anxiety   . Bilateral renal artery stenosis ([place]) 1999   s/p stenting  . Chronic kidney disease (CKD), stage III (moderate) ([place])    Archie Endo 12/22/2016  . Coronary artery disease 1999  . Depression   . Diverticulosis   . Duodenitis   . DVT (deep venous thrombosis) ([facility])    on Coumadin  . Dyspnea   . Gastric erosions   . Gastric ulcer   . GERD (gastroesophageal reflux disease)   . Heart murmur   . History of blood transfusion ~ 03/2016   "low HgB; practically nonexistent"  . Hyperlipidemia   . Hypertension   . MI (myocardial infarction) ([place]) 1999  . Multiple myeloma ([place]) dx'd ~ 04/2016  . PAT (paroxysmal atrial tachycardia) ([place]) 12/22/2016  . Retinal hemorrhage, right eye   . Schatzki's ring   . Tachy-brady syndrome ([place])    Archie Endo 12/22/2016  . Tubular adenoma of colon     Allergies Allergies  Allergen Reactions  . Sulfa Antibiotics Rash    IV Location/Drains/Wounds Patient Lines/Drains/Airways Status   Active Line/Drains/Airways    Name:   Placement date:   Placement time:   Site:   Days:   Peripheral IV 10/22/18 Right Wrist   10/22/18    0817    Wrist   less than 1   Peripheral IV 10/22/18 Left;Upper Arm   10/22/18    0817    Arm   less than 1   Peripheral IV 10/22/18 Right Other (Comment)   10/22/18    0846    Other (Comment)   less than 1   Urethral Catheter AEvans,RN Non-latex;Straight-tip 16 Fr.   10/22/18    0913    Non-latex;Straight-tip   less than 1          Labs/Imaging Results for orders placed or performed during the hospital encounter of 10/22/18 (from the past 48 hour(s))  CBC with Differential     Status: Abnormal   Collection Time: 10/22/18  8:15 AM  Result Value Ref Range   WBC 13.7 (H) 4.0 - 10.5 K/uL    Comment: WHITE COUNT CONFIRMED ON SMEAR   RBC 3.46 (L) 3.87 - 5.11 MIL/uL   Hemoglobin 10.0 (L) 12.0 - 15.0 g/dL   HCT 33.9 (L) 36.0 - 46.0 %   MCV  98.0 80.0 - 100.0 fL   MCH 28.9 26.0 - 34.0 pg   MCHC 29.5 (L) 30.0 - 36.0 g/dL   RDW 20.3 (H) 11.5 - 15.5 %   Platelets 323 150 - 400 K/uL   nRBC 0.0 0.0 - 0.2 %   Neutrophils Relative % 86 %   Neutro Abs 12.0 (H) 1.7 - 7.7 K/uL   Lymphocytes Relative 2 %   Lymphs Abs 0.3 (L) 0.7 - 4.0 K/uL   Monocytes Relative 11 %   Monocytes Absolute 1.5 (H) 0.1 - 1.0 K/uL   Eosinophils Relative 0 %  Eosinophils Absolute 0.0 0.0 - 0.5 K/uL   Basophils Relative 0 %   Basophils Absolute 0.1 0.0 - 0.1 K/uL   Immature Granulocytes 1 %   Abs Immature Granulocytes 0.14 (H) 0.00 - 0.07 K/uL    Comment: Performed at [facility], [place] [address] [place], [place] [address]  Comprehensive metabolic panel     Status: Abnormal   Collection Time: 10/22/18  8:15 AM  Result Value Ref Range   Sodium 136 135 - 145 mmol/L   Potassium 3.5 3.5 - 5.1 mmol/L   Chloride 105 98 - 111 mmol/L   CO2 18 (L) 22 - 32 mmol/L   Glucose, Bld 137 (H) 70 - 99 mg/dL   BUN 27 (H) 8 - 23 mg/dL   Creatinine, Ser 3.20 (H) 0.44 - 1.00 mg/dL   Calcium 8.9 8.9 - 10.3 mg/dL   Total Protein 7.7 6.5 - 8.1 g/dL   Albumin 4.0 3.5 - 5.0 g/dL   AST 26 15 - 41 U/L   ALT 11 0 - 44 U/L   Alkaline Phosphatase 60 38 - 126 U/L   Total Bilirubin 0.9 0.3 - 1.2 mg/dL   GFR calc non Af Amer 14 (L) >60 mL/min   GFR calc Af Amer 16 (L) >60 mL/min   Anion gap 13 5 - 15    Comment: Performed at [facility], [place] [address] [place], [place] [address]  Lipase, blood     Status: None   Collection Time: 10/22/18  8:15 AM  Result Value Ref Range   Lipase 36 11 - 51 U/L    Comment: Performed at [facility], [place] [address] [place], [place] [address]  Sample to Blood Bank     Status: None   Collection Time: 10/22/18  8:15 AM  Result Value Ref Range   Blood Bank Specimen SAMPLE AVAILABLE FOR TESTING    Sample Expiration      10/25/2018 Performed at [facility], [place]  [address], [place], [place] [address]   Magnesium     Status: None   Collection Time: 10/22/18  8:15 AM  Result Value Ref Range   Magnesium 1.7 1.7 - 2.4 mg/dL    Comment: Performed at [facility], [place] [address] [place], [place] [address]  I-Stat Troponin, ED (not at [facility])     Status: None   Collection Time: 10/22/18  8:19 AM  Result Value Ref Range   Troponin i, poc 0.08 0.00 - 0.08 ng/mL   Comment NOTIFIED PHYSICIAN    Comment 3            Comment: Due to the release kinetics of cTnI, a negative result within the first hours of the onset of symptoms does not rule out myocardial infarction with certainty. If myocardial infarction is still suspected, repeat the test at appropriate intervals.   I-Stat CG4 Lactic Acid, ED     Status: Abnormal   Collection Time: 10/22/18  8:21 AM  Result Value Ref Range   Lactic Acid, Venous 3.23 (HH) 0.5 - 1.9 mmol/L   Comment NOTIFIED PHYSICIAN   I-Stat Chem 8, ED     Status: Abnormal   Collection Time: 10/22/18  8:21 AM  Result Value Ref Range   Sodium 140 135 - 145 mmol/L   Potassium 3.6 3.5 - 5.1 mmol/L   Chloride 111 98 - 111 mmol/L   BUN 26 (H) 8 - 23 mg/dL   Creatinine, Ser 3.20 (  H) 0.44 - 1.00 mg/dL   Glucose, Bld 133 (H) 70 - 99 mg/dL   Calcium, Ion 1.19 1.15 - 1.40 mmol/L   TCO2 20 (L) 22 - 32 mmol/L   Hemoglobin 11.6 (L) 12.0 - 15.0 g/dL   HCT 34.0 (L) 36.0 - 46.0 %  Procalcitonin - Baseline     Status: None   Collection Time: 10/22/18  8:23 AM  Result Value Ref Range   Procalcitonin 1.69 ng/mL    Comment:        Interpretation: PCT > 0.5 ng/mL and <= 2 ng/mL: Systemic infection (sepsis) is possible, but other conditions are known to elevate PCT as well. (NOTE)       Sepsis PCT Algorithm           Lower Respiratory Tract                                      Infection PCT Algorithm    ----------------------------     ----------------------------         PCT < 0.25 ng/mL                PCT < 0.10 ng/mL          Strongly encourage             Strongly discourage   discontinuation of antibiotics    initiation of antibiotics    ----------------------------     -----------------------------       PCT 0.25 - 0.50 ng/mL            PCT 0.10 - 0.25 ng/mL               OR       >80% decrease in PCT            Discourage initiation of                                            antibiotics      Encourage discontinuation           of antibiotics    ----------------------------     -----------------------------         PCT >= 0.50 ng/mL              PCT 0.26 - 0.50 ng/mL                AND       <80% decrease in PCT             Encourage initiation of                                             antibiotics       Encourage continuation           of antibiotics    ----------------------------     -----------------------------        PCT >= 0.50 ng/mL                  PCT > 0.50 ng/mL               AND  increase in PCT                  Strongly encourage                                      initiation of antibiotics    Strongly encourage escalation           of antibiotics                                     -----------------------------                                           PCT <= 0.25 ng/mL                                                 OR                                        > 80% decrease in PCT                                     Discontinue / Do not initiate                                             antibiotics Performed at [place] [address], [place], [place] [address]   Blood culture (routine x 2)     Status: None (Preliminary result)   Collection Time: 10/22/18  8:26 AM  Result Value Ref Range   Specimen Description BLOOD LEFT FOOT    Special Requests      BOTTLES DRAWN AEROBIC ONLY Blood Culture results may not be optimal due to an inadequate volume of blood received in culture bottles Performed at [place] [address],  [place], [place] [address]    Culture PENDING    Report Status PENDING   Urinalysis, Routine w reflex microscopic     Status: Abnormal   Collection Time: 10/22/18  9:15 AM  Result Value Ref Range   Color, Urine YELLOW YELLOW   APPearance HAZY (A) CLEAR   Specific Gravity, Urine >1.030 (H) 1.005 - 1.030   pH 5.0 5.0 - 8.0   Glucose, UA NEGATIVE NEGATIVE mg/dL   Hgb urine dipstick NEGATIVE NEGATIVE   Bilirubin Urine MODERATE (A) NEGATIVE   Ketones, ur TRACE (A) NEGATIVE mg/dL   Protein, ur NEGATIVE NEGATIVE mg/dL   Nitrite NEGATIVE NEGATIVE   Leukocytes, UA NEGATIVE NEGATIVE    Comment: Microscopic not done on urines with negative protein, blood, leukocytes, nitrite, or glucose < 500 mg/dL. Performed at [facility], [place] [address] [place], [place] [address]    Ct Abdomen Pelvis Wo Contrast  Result Date: 10/22/2018 CLINICAL DATA:  Weakness, abdominal pain  EXAM: CT ABDOMEN AND PELVIS WITHOUT CONTRAST TECHNIQUE: Multidetector CT imaging of the abdomen and pelvis was performed following the standard protocol without IV contrast. COMPARISON:  09/24/2018 FINDINGS: Lower chest: Bibasilar atelectasis. Normal heart size. Atherosclerosis noted. No pericardial or pleural effusion. No large hiatal hernia. Hepatobiliary: No focal liver abnormality is seen. No gallstones, gallbladder wall thickening, or biliary dilatation. Pancreas: Unremarkable. No pancreatic ductal dilatation or surrounding inflammatory changes. Spleen: Normal in size.  Accessory splenules noted. Adrenals/Urinary Tract: Normal adrenal glands. No acute renal obstruction, hydronephrosis, obstructing ureteral calculus, or UVJ abnormality. Bilateral renal artery stents noted. Similar hypodense and hyperdense left renal cysts are present, largest measures 2.2 cm laterally. Stomach/Bowel: Stomach and small bowel demonstrate no significant finding, ileus or obstruction pattern. Appendix not visualized with certainty. Wall thickening  and surrounding edema noted of the colon extending from the splenic flexure and the proximal first half of the left descending colon compatible with acute left colitis. Mild distention of the transverse colon may represent associated mild ileus related to the colitis. More distal sigmoid and rectum demonstrate diverticulosis without wall thickening. No evidence of perforation, abscess, hemorrhage or hematoma. Small amount of pelvic free fluid likely related to the colitis. Vascular/Lymphatic: Atherosclerosis of the aorta and abdominal vasculature. Negative for aneurysm. No adenopathy. Reproductive: Status post hysterectomy. No adnexal masses. Other: Intact abdominal wall. Negative for hernia. Trace pelvic ascites. Musculoskeletal: Degenerative changes noted of the spine. Stable retrolisthesis of T12 upon L1 and L1 upon L2 related to degenerative change. Stable myeloma changes at T12. No acute compression fracture or significant interval change. IMPRESSION: Acute left colitis extending from the splenic flexure and involving the proximal first half of the left descending colon which may be related to infectious/inflammatory etiology versus ischemic. No associated definite obstruction pattern, free air, or pneumatosis. Trace pelvic ascites, suspect related to the colitis. No fluid collection or abscess Electronically Signed   By: Jerilynn Mages.  Shick M.D.   On: 10/22/2018 11:32   Dg Chest Port 1 View  Result Date: 10/22/2018 CLINICAL DATA:  Chest pain, abdominal pain, weakness, history multiple myeloma, coronary artery disease post MI, hypertension, arrhythmia is, smoker EXAM: PORTABLE CHEST 1 VIEW COMPARISON:  Portable exam 0803 hours compared to 10/08/2018 FINDINGS: Upper normal heart size. Atherosclerotic calcification aorta. Mediastinal contours and pulmonary vascularity normal. Minimal bibasilar atelectasis. Lungs otherwise clear. No infiltrate, pleural effusion or pneumothorax. Inferior spur formation RIGHT acromion with  adjacent nonspecific soft tissue calcification. IMPRESSION: Bibasilar atelectasis. Electronically Signed   By: Lavonia Dana M.D.   On: 10/22/2018 08:35   None  Pending Labs Unresulted Labs (From admission, onward)    Start     Ordered   10/23/18 0500  Procalcitonin  Daily,   R     10/22/18 1123   10/23/18 0500  CBC  Tomorrow morning,   R     10/22/18 1123   10/23/18 0500  Comprehensive metabolic panel  Tomorrow morning,   R     10/22/18 1123   10/23/18 0500  Troponin I - Tomorrow AM 0500  Tomorrow morning,   R     10/22/18 1123   10/23/18 0500  Protime-INR  Tomorrow morning,   R     10/22/18 1123   10/23/18 0500  Vancomycin, random  Tomorrow morning,   R     10/22/18 1245   10/22/18 1400  Lactic acid, plasma  Once,   R     10/22/18 1123   10/22/18 1153  Lactic acid, plasma  ONCE -  STAT,   R     10/22/18 1152   10/22/18 1132  Procalcitonin - Baseline  Add-on,   STAT    Comments:  Draw with next scheduled lab draw.   10/22/18 1131   10/22/18 0821  Blood culture (routine x 2)  BLOOD CULTURE X 2,   STAT     10/22/18 0820          Vitals/Pain Today's Vitals   10/22/18 1200 10/22/18 1215 10/22/18 1230 10/22/18 1255  BP: (!) 87/36 97/75 140/80 (!) 102/55  Pulse:  69 76 73  Resp: 19 (!) 24 (!) 28 17  SpO2:  96% 98% 100%    Isolation Precautions No active isolations  Medications Medications  magnesium sulfate IVPB 2 g 50 mL (2 g Intravenous New Bag/Given 10/22/18 1244)  piperacillin-tazobactam (ZOSYN) IVPB 2.25 g (has no administration in time range)  vancomycin variable dose per unstable renal function (pharmacist dosing) (has no administration in time range)  0.9 %  sodium chloride infusion ( Intravenous Bolus 10/22/18 0846)  vancomycin (VANCOCIN) IVPB 1000 mg/200 mL premix (0 mg Intravenous Stopped 10/22/18 1101)  ceFEPIme (MAXIPIME) 2 g in sodium chloride 0.9 % 100 mL IVPB (0 g Intravenous Stopped 10/22/18 0917)  ondansetron (ZOFRAN) injection 4 mg (4 mg Intravenous Given  10/22/18 0845)  lactated ringers bolus 1,000 mL (1,000 mLs Intravenous New Bag/Given 10/22/18 1222)    Mobility non-ambulatory

## 2018-10-22 NOTE — ED Provider Notes (Signed)
[facility] DEPT Provider Note   CSN: [id number] Arrival date & time: 10/22/18  0747     History   Chief Complaint Chief Complaint  Patient presents with  . Hypotension    HPI Abigail [name] is a 73 y.o. female.  Level 5 caveat for urgent need for intervention.  Patient has a known history of multiple myeloma and has had several admissions in the past few months.  She has had multiple episodes of nausea, vomiting, diarrhea, abdominal discomfort for the past 12 hours.  On initial EMS evaluation, her blood pressure was 70 mm systolic and she was bradycardic rate of 50.  Glucose 151.     Past Medical History:  Diagnosis Date  . ACS (acute coronary syndrome) ([place]) 06/2017  . Anemia    "off and on all my life" (12/22/2016)  . Anxiety   . Bilateral renal artery stenosis ([place]) 1999   s/p stenting  . Chronic kidney disease (CKD), stage III (moderate) ([place])    Archie Endo 12/22/2016  . Coronary artery disease 1999  . Depression   . Diverticulosis   . Duodenitis   . DVT (deep venous thrombosis) (H[facility])    on Coumadin  . Dyspnea   . Gastric erosions   . Gastric ulcer   . GERD (gastroesophageal reflux disease)   . Heart murmur   . History of blood transfusion ~ 03/2016   "low HgB; practically nonexistent"  . Hyperlipidemia   . Hypertension   . MI (myocardial infarction) ([place]) 1999  . Multiple myeloma ([place]) dx'd ~ 04/2016  . PAT (paroxysmal atrial tachycardia) ([place]) 12/22/2016  . Retinal hemorrhage, right eye   . Schatzki's ring   . Tachy-brady syndrome ([place])    Archie Endo 12/22/2016  . Tubular adenoma of colon     Patient Active Problem List   Diagnosis Date Noted  . SVT (supraventricular tachycardia) ([place]) 10/08/2018  . Atrial fibrillation with RVR ([place]) 120-Apr-202019  . Precordial chest pain 09/15/2018  . Multiple myeloma in relapse ([place]) 06/15/2018  . Goals of care, counseling/discussion 06/15/2018  . Hematochezia   . Acute blood loss anemia   .  Diverticulosis of colon with hemorrhage   . GIB (gastrointestinal bleeding) 09/03/2017  . Symptomatic anemia 09/03/2017  . Acute systolic heart failure ([place]) 09/03/2017  . Chronic kidney disease (CKD), stage III (moderate) ([place]) 09/03/2017  . Hyperlipidemia 09/03/2017  . Hypertension 09/03/2017  . Multiple myeloma in remission ([place]) 09/03/2017  . GI bleed 08/17/2017  . Sepsis ([place]) 08/17/2017  . Fever 08/16/2017  . STEMI (ST elevation myocardial infarction) ([place]) 02/24/2017  . Acute anterolateral wall MI ([place]) 02/24/2017  . Dehydration 01/21/2017  . Acute coronary syndrome ([place]) 12/24/2016  . Dizziness 12/22/2016    Class: Acute  . Orthostatic hypotension 12/08/2016  . Syncope and collapse 12/06/2016  . Tachycardia-bradycardia ([place])   . Essential hypertension   . Nausea and vomiting in adult patient   . Erosive esophagitis   . Protein-calorie malnutrition, severe 11/19/2016  . LLQ pain   . LUQ abdominal pain   . Intractable nausea and vomiting 11/17/2016  . Abdominal pain 11/05/2016  . Multiple myeloma ([place]) 06/18/2016  . Malnutrition of moderate degree 06/14/2016  . 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 Exertional dyspnea 11/01/2015  . Coronary atherosclerosis of native coronary artery 11/01/2015  . Shortness of breath 11/01/2015  . Chest pain 12/31/2011  . Coronary artery disease 10/20/1997    Past Surgical History:  Procedure Laterality  Date  . APPENDECTOMY    . BACK SURGERY    . CATARACT EXTRACTION W/ INTRAOCULAR LENS IMPLANT Right 2014  . COLONOSCOPY WITH PROPOFOL N/A 09/04/2017   Procedure: COLONOSCOPY WITH PROPOFOL;  Surgeon: Doran Stabler, MD;  Location: [facility];  Service: Gastroenterology;  Laterality: N/A;  . CORONARY ANGIOGRAPHY N/A 01/23/2017   Procedure: Coronary Angiography;  Surgeon: [name], MD;  Location: [facility];  Service: Cardiovascular;  Laterality: N/A;  . CORONARY ANGIOPLASTY WITH STENT  PLACEMENT  1999  . CORONARY STENT INTERVENTION N/A 12/29/2016   Procedure: Coronary Stent Intervention;  Surgeon: Charolette Forward, MD;  Location: [facility];  Service: Cardiovascular;  Laterality: N/A;  . CORONARY STENT INTERVENTION N/A 02/24/2017   Procedure: Coronary Stent Intervention;  Surgeon: Charolette Forward, MD;  Location: [facility];  Service: Cardiovascular;  Laterality: N/A;  . ESOPHAGOGASTRODUODENOSCOPY N/A 11/03/2015   Procedure: ESOPHAGOGASTRODUODENOSCOPY (EGD);  Surgeon: Carol Ada, MD;  Location[facility] ENDOSCOPY;  Service: Endoscopy;  Laterality: N/A;  . ESOPHAGOGASTRODUODENOSCOPY N/A 09/03/2017   Procedure: ESOPHAGOGASTRODUODENOSCOPY (EGD);  Surgeon: Doran Stabler, MD;  Location: [facility];  Service: Gastroenterology;  Laterality: N/A;  . ESOPHAGOGASTRODUODENOSCOPY (EGD) WITH PROPOFOL N/A 11/18/2016   Procedure: ESOPHAGOGASTRODUODENOSCOPY (EGD) WITH PROPOFOL;  Surgeon: Ladene Artist, MD;  Location: [facility] ENDOSCOPY;  Service: Endoscopy;  Laterality: N/A;  . LEFT HEART CATH AND CORONARY ANGIOGRAPHY N/A 12/24/2016   Procedure: Left Heart Cath and Coronary Angiography;  Surgeon: [name], MD;  Location: [facility];  Service: Cardiovascular;  Laterality: N/A;  . LEFT HEART CATH AND CORONARY ANGIOGRAPHY N/A 02/24/2017   Procedure: Left Heart Cath and Coronary Angiography;  Surgeon: Charolette Forward, MD;  Location: [facility];  Service: Cardiovascular;  Laterality: N/A;  . LEFT HEART CATH AND CORONARY ANGIOGRAPHY N/A 03/05/2017   Procedure: Left Heart Cath and Coronary Angiography;  Surgeon: [name], MD;  Location: [facility];  Service: Cardiovascular;  Laterality: N/A;  . LEFT HEART CATH AND CORONARY ANGIOGRAPHY N/A 09/02/2018   Procedure: LEFT HEART CATH AND CORONARY ANGIOGRAPHY;  Surgeon: [name], MD;  Location: [facility];  Service: Cardiovascular;  Laterality: N/A;  . LUMBAR [place]    . RENAL ARTERY STENT  1999   bil RAS  so ? bil vs unilateral stents  . RETINAL LASER PROCEDURE Right 2012   "bleeding"  . TONSILLECTOMY    . TOTAL ABDOMINAL HYSTERECTOMY       OB History   No obstetric history on file.      Home Medications    Prior to Admission medications   Medication Sig Start Date End Date Taking? Authorizing Provider  acyclovir (ZOVIRAX) 400 MG tablet TAKE 1 TABLET (400 MG TOTAL) BY MOUTH 2 (TWO) TIMES DAILY. 08/29/16   Ladell Pier, MD  amiodarone (PACERONE) 200 MG tablet Take 0.5 tablets (100 mg total) by mouth daily. 10/19/18   [name], MD  atorvastatin (LIPITOR) 80 MG tablet Take 0.5 tablets (40 mg total) by mouth daily at 6 PM. 10/03/18   Charolette Forward, MD  calcium carbonate (OS-CAL - DOSED IN MG OF ELEMENTAL CALCIUM) 1250 (500 Ca) MG tablet Take 1 tablet by mouth daily with breakfast.    [provider]  cyclophosphamide (CYTOXAN) 50 MG capsule Take 10 capsules (53m) by mouth once weekly for 3 weeks on, 1 week off, repeat every 4 weeks. Take early in the day. Maintain hydration. 10/28/18   SLadell Pier  MD  dexamethasone (DECADRON) 4 MG tablet Take 5 tablets (61m) weekly day of chemotherapy 10/21/18   SLadell Pier MD  diltiazem (CARDIZEM) 30 MG tablet Take 1 tablet (30 mg total) by mouth every 12 (twelve) hours. 10/19/18   KDixie Dials MD  feeding supplement, ENSURE ENLIVE, (ENSURE ENLIVE) LIQD Take 237 mLs by mouth 2 (two) times daily between meals. Patient taking differently: Take 237 mLs by mouth See admin instructions. Drink one bottle (237 ml) by mouth one or two times daily as a meal supplement 09/17/18   KDixie Dials MD  ferrous sulfate 325 (65 FE) MG tablet Take 1 tablet (325 mg total) by mouth daily with breakfast. 03/02/17   KDixie Dials MD  HYDROcodone-acetaminophen (NORCO/VICODIN) 5-325 MG tablet Take 1 tablet by mouth 2 (two) times daily as needed for moderate pain. 10/07/18   SLadell Pier MD  nitroGLYCERIN (NITROSTAT) 0.4 MG SL tablet Place 0.4 mg  under the tongue every 5 (five) minutes as needed for chest pain. 03/20/17   [provider]  pantoprazole (PROTONIX) 40 MG tablet Take 1 tablet (40 mg total) by mouth 2 (two) times daily before a meal. 09/17/18   KDixie Dials MD  Potassium Chloride ER (K-TAB) 20 MEQ TBCR Take 20 mEq by mouth daily. 10/21/18   SLadell Pier MD  pyridostigmine (MESTINON) 60 MG tablet Take 0.5 tablets (30 mg total) by mouth 2 (two) times daily. 01/25/17   KDixie Dials MD  tiZANidine (ZANAFLEX) 4 MG tablet Take 4 mg by mouth at bedtime as needed for muscle spasms.  04/06/18   [provider]  ISOSORBIDE DINITRATE PO Take by mouth.  12/30/11  [provider]    Family History Family History  Problem Relation Age of Onset  . Breast cancer Mother   . Breast cancer Sister   . Breast cancer Maternal Aunt   . Colon cancer Neg Hx     Social History Social History   Tobacco Use  . Smoking status: Current Every Day Smoker    Packs/day: 0.50    Years: 50.00    Pack years: 25.00    Types: Cigarettes  . Smokeless tobacco: Never Used  Substance Use Topics  . Alcohol use: No    Alcohol/week: 0.0 standard drinks  . Drug use: No     Allergies   Sulfa antibiotics   Review of Systems Review of Systems  Unable to perform ROS: Acuity of condition     Physical Exam Updated Vital Signs BP (!) 98/52   Pulse (!) 55   Resp 18   SpO2 97%   Physical Exam Vitals signs and nursing note reviewed.  Constitutional:      Appearance: She is well-developed.     Comments: Pale, able to answer questions, hypotensive, bradycardic  HENT:     Head: Normocephalic and atraumatic.  Eyes:     Conjunctiva/sclera: Conjunctivae normal.  Neck:     Musculoskeletal: Neck supple.  Cardiovascular:     Rate and Rhythm: Regular rhythm. Bradycardia present.  Pulmonary:     Effort: Pulmonary effort is normal.     Breath sounds: Normal breath sounds.  Abdominal:     Comments: Minimal distention,  generalized tenderness.  Musculoskeletal: Normal range of motion.  Skin:    General: Skin is warm and dry.  Neurological:     Mental Status: She is oriented to person, place, and time.  Psychiatric:        Behavior: Behavior normal.  ED Treatments / Results  Labs (all labs ordered are listed, but only abnormal results are displayed) Labs Reviewed  CBC WITH DIFFERENTIAL/PLATELET - Abnormal; Notable for the following components:      Result Value   WBC 13.7 (*)    RBC 3.46 (*)    Hemoglobin 10.0 (*)    HCT 33.9 (*)    MCHC 29.5 (*)    RDW 20.3 (*)    Neutro Abs 12.0 (*)    Lymphs Abs 0.3 (*)    Monocytes Absolute 1.5 (*)    Abs Immature Granulocytes 0.14 (*)    All other components within normal limits  COMPREHENSIVE METABOLIC PANEL - Abnormal; Notable for the following components:   CO2 18 (*)    Glucose, Bld 137 (*)    BUN 27 (*)    Creatinine, Ser 3.20 (*)    GFR calc non Af Amer 14 (*)    GFR calc Af Amer 16 (*)    All other components within normal limits  URINALYSIS, ROUTINE W REFLEX MICROSCOPIC - Abnormal; Notable for the following components:   APPearance HAZY (*)    Specific Gravity, Urine >1.030 (*)    Bilirubin Urine MODERATE (*)    Ketones, ur TRACE (*)    All other components within normal limits  I-STAT CG4 LACTIC ACID, ED - Abnormal; Notable for the following components:   Lactic Acid, Venous 3.23 (*)    All other components within normal limits  I-STAT CHEM 8, ED - Abnormal; Notable for the following components:   BUN 26 (*)    Creatinine, Ser 3.20 (*)    Glucose, Bld 133 (*)    TCO2 20 (*)    Hemoglobin 11.6 (*)    HCT 34.0 (*)    All other components within normal limits  CULTURE, BLOOD (ROUTINE X 2)  CULTURE, BLOOD (ROUTINE X 2)  LIPASE, BLOOD  MAGNESIUM  I-STAT TROPONIN, ED  I-STAT CG4 LACTIC ACID, ED  SAMPLE TO BLOOD BANK    EKG None  Radiology Dg Chest Port 1 View  Result Date: 10/22/2018 CLINICAL DATA:  Chest pain, abdominal  pain, weakness, history multiple myeloma, coronary artery disease post MI, hypertension, arrhythmia is, smoker EXAM: PORTABLE CHEST 1 VIEW COMPARISON:  Portable exam 0803 hours compared to 10/08/2018 FINDINGS: Upper normal heart size. Atherosclerotic calcification aorta. Mediastinal contours and pulmonary vascularity normal. Minimal bibasilar atelectasis. Lungs otherwise clear. No infiltrate, pleural effusion or pneumothorax. Inferior spur formation RIGHT acromion with adjacent nonspecific soft tissue calcification. IMPRESSION: Bibasilar atelectasis. Electronically Signed   By: Lavonia Dana M.D.   On: 10/22/2018 08:35    Procedures Procedures (including critical care time)  Medications Ordered in ED Medications  vancomycin (VANCOCIN) IVPB 1000 mg/200 mL premix (1,000 mg Intravenous New Bag/Given 10/22/18 1001)  0.9 %  sodium chloride infusion ( Intravenous Bolus 10/22/18 0846)  ceFEPIme (MAXIPIME) 2 g in sodium chloride 0.9 % 100 mL IVPB (0 g Intravenous Stopped 10/22/18 0917)  ondansetron (ZOFRAN) injection 4 mg (4 mg Intravenous Given 10/22/18 0845)     Initial Impression / Assessment and Plan / ED Course  I have reviewed the triage vital signs and the nursing notes.  Pertinent labs & imaging results that were available during my care of the patient were reviewed by me and considered in my medical decision making (see chart for details).     Complex patient presents with nausea, vomiting, diarrhea, hypotension, bradycardia.  White count 13.7.  Creatinine elevated.  Chest x-ray shows no  obvious pneumonia.  Urinalysis pending.  She could be septic.  Will initiate vigorous IV hydration, IV antibiotics.  CT abdomen pelvis ordered.  Discussed with critical care for further assistance in managing this patient.    CRITICAL CARE Performed by: Nat Christen Total critical care time: 40 minutes Critical care time was exclusive of separately billable procedures and treating other patients. Critical care was  necessary to treat or prevent imminent or life-threatening deterioration. Critical care was time spent personally by me on the following activities: development of treatment plan with patient and/or surrogate as well as nursing, discussions with consultants, evaluation of patient's response to treatment, examination of patient, obtaining history from patient or surrogate, ordering and performing treatments and interventions, ordering and review of laboratory studies, ordering and review of radiographic studies, pulse oximetry and re-evaluation of patient's condition.  Final Clinical Impressions(s) / ED Diagnoses   Final diagnoses:  Multiple myeloma, remission status unspecified ([place])  Hypotension, unspecified hypotension type  Nausea vomiting and diarrhea    ED Discharge Orders    None       Nat Christen, MD 10/22/18 1054

## 2018-10-22 NOTE — Progress Notes (Addendum)
Pharmacy Antibiotic Note  Abigail [name] is a 73 y.o. female admitted on 10/22/2018 with colitis.  Pharmacy has been consulted for vancomycin and Zosyn dosing.  PMH includes multiple myeloma brought to the ED by EMS 10/22/2017 with complaints of weakness and abdominal pain. She had been hospitalized 12/27 with dehydration, anemia and atrial fibrillation with discharge 12/31.   Patient was seen at [facility] yesterday, 10/21/2018.Her chemotherapy treatment was held due to poor performance status and elevated creatinine. Patient reports x1 episode of diarrhea and multiple episodes of vomiting over the last 12 hours.   1/3 Addendum IV vanc stopped. Subsequently, pharmacy is consulted to dose PO vancomycin for possible  C.diff colitis   Plan:  Zosyn 2.25 gr IV q8h    Vancomycin 125 mg PO QID x 10 day   Follow up C.diff PCR, if negative consider stopping PO vancomycin   Monitor clinical course, renal function, cultures as available      No data recorded.  Recent Labs  Lab 10/15/18 1406 10/16/18 0409 10/17/18 0648 10/21/18 1055 10/22/18 0815 10/22/18 0821  WBC 3.2* 3.1* 3.6* 4.2 13.7*  --   CREATININE 1.32* 1.17* 1.25* 1.97* 3.20* 3.20*  LATICACIDVEN  --   --   --   --   --  3.23*    Estimated Creatinine Clearance: 14.5 mL/min (A) (by C-G formula based on SCr of 3.2 mg/dL (H)).    Allergies  Allergen Reactions  . Sulfa Antibiotics Rash    Antimicrobials this admission: Cefepime x1 on 1/3   Vancomycin 1/3 x 1 in ED PO Vancomycin 1/3  >>  Zosyn 1/3 >>  Dose adjustments this admission:   Microbiology results: 1/3 BCx:  1/3 C.diff PCR:    Thank you for allowing pharmacy to be a part of this patient's care.   Royetta Asal, PharmD, BCPS Pager [phone] 10/22/2018 12:40 PM

## 2018-10-22 NOTE — H&P (Addendum)
NAME:  Abigail [name], MRN:  [id number], DOB:  Sep 14, 1946, LOS: 0 ADMISSION DATE:  10/22/2018, CONSULTATION DATE:  10/22/2017 REFERRING MD:  Gabriel Cirri, CHIEF COMPLAINT:  Hypotension, bradycardia, N, V, Diarrhea   Brief History   73 year old female current every day smoker   with complex medical history including multiple myeloma brought to the ED by EMS 10/22/2017 with complaints of weakness and abdominal pain. She had been hospitalized 12/27 with dehydration, anemia and atrial fibrillation with discharge 10/20/2019.   Patient was seen at [facility] yesterday, 10/21/2018.Her chemotherapy treatment was held due to poor performance status and elevated creatinine. Patient reports x1 episode of diarrhea and multiple episodes of vomiting over the last 12 hours. EMS reports 73m Hg palpate, bradycardic with history of Afib at 50bpm. Patient very hypotensive in triage. Patient AxOx4. CBG for EMS = 151. EMS was not able to get an IV or give fluids en route. Patient in trendelenburg. Patient reports she usually has high blood pressure. PCCM have been asked to   History of present illness   73year old female patient  with complex medical history including multiple myeloma brought to the ED by EMS 10/22/2017 with complaints of weakness and abdominal pain. She had been hospitalized 12/27 with dehydration, anemia and atrial fibrillation with discharge 10/20/2019.   [facility]Her chemotherapy treatment was held due to poor performance status and elevated creatinine. Patient reports x1 episode of diarrhea and multiple episodes of vomiting over the last 12 hours. EMS reports 723mHg palpate, bradycardic with history of Afib at 50bpm. Patient very hypotensive in triage. Patient AxOx4. CBG for EMS = 151. EMS was not able to get an IV or give fluids en route. Patient in trendelenburg. Patient reports she usually has high blood pressure.  ED treatment course has included fluid resuscitation  ( 2700 cc's) and initiation of Vanc and Maxipime , CT Abdomen pelvis ( pending ) and Renal [place] pending). Patient's mentation has improved, BP has stabilized but remains soft.  Significant Labs :  WBC 13.7 ( up from 4.2 on 10/21/2018) HGB 11.6 Lactate of 3.23 Creatinine of  3.20 UA negative for Nitrities, SG > 1.030, trace ketones  CXR 10/22/2018>>  Bibasilar atelectasis  Past Medical History  Dehydration Atrial fibrillation Anemia of chronic disease with GI bleed CAD S/P stent in LAD and LCx Multiple myeloma with chemotherapy Abnormal weight loss Chronic gastritis H/O DVT CKD, III Hypertension Moderate MR and TR Myasthenia gravis  Past Medical History:  Diagnosis Date  . ACS (acute coronary syndrome) ([facility]09/2018  . Anemia    "off and on all my life" (12/22/2016)  . Anxiety   . Bilateral renal artery stenosis ([place]   s/p stenting  . Chronic kidney disease (CKD), stage III (moderate) (HCSuccess   /nArchie Endo/02/2017  . Coronary artery disease 1999  . Depression   . Diverticulosis   . Duodenitis   . DVT (deep venous thrombosis) (HCC)    on Coumadin  . Dyspnea   . Gastric erosions   . Gastric ulcer   . GERD (gastroesophageal reflux disease)   . Heart murmur   . History of blood transfusion ~ 03/2016   "low HgB; practically nonexistent"  . Hyperlipidemia   . Hypertension   . MI (myocardial infarction) (HC[place]1999  . Multiple myeloma ([place]CC[place] Parkdx'd ~ 04/2016  . PAT (paroxysmal atrial tachycardia) (HCInver[place]03/02/2017  . Retinal hemorrhage, right eye   .  Schatzki's ring   . Tachy-brady syndrome ([place])    Archie Endo 12/22/2016  . Tubular adenoma of colon     Significant Hospital Events   12/27-12/31: Admission with Dehydration after chemo infusion, atrial fibrillation, anemia, multiple myeloma, weight loss, CKD III 10/22/2018: Admission for LLQ pain, N&V, Diarrhea, Hypotension, FTT  Consults:  10/22/2018>>PCCM  Procedures:    Significant Diagnostic Tests:  10/22/2018>> CT Abdomen  and Pelvis>>Acute left colitis extending from the splenic flexure and involving the proximal first half of the left descending colon which may be related to infectious/inflammatory etiology versus ischemic.  No associated definite obstruction pattern, free air, or pneumatosis.  Trace pelvic ascites, suspect related to the colitis.  No fluid collection or abscess  10/22/2018>> Renal US 10/16/2018>> KUB>> No bowel obstruction or illeus 09/24/2018 CT Abdomen and Pelvis>> Mild Gastritis, colonic diverticulosis  Without acute diverticulitis  Micro Data:  10/22/2018 Blood Cx  Antimicrobials:  Vanc 10/22/2018 x 1 Cefipime 10/22/2018 x 1  Interim history/subjective:  Pt is more alert after fluid resuscitation. No need for pressors at present.She is complaining of LLQ abdominal pain that she has had since 10/16/2019. CT abdomen is pending. We  discussed her chronic illness and multiple hospitalizations . I asked her if she continues to want aggressive care to include CPR, defibrillation, intubation vs palliation / comfort care .She states she wants everything done, she especially wants something done about her pain.Marland Kitchen Her sister is at her bedside and supports this decision. Per her sister, she has had significant weight loss, and no appetite. She also explained that the patient was scheduled for a colonoscopy 10/21/2018, but was not well enough to have it done. Last chemo 12/26 per patient.She had adverse reaction and was hospitalized the day after administration.Chemo was scheduled for 10/21/2018, but held due to poor functional status  And elevated creatinine.  Objective   Blood pressure 98/79, pulse 63, resp. rate 18, SpO2 100 %.        Intake/Output Summary (Last 24 hours) at 10/22/2018 0957 Last data filed at 10/22/2018 0913 Gross per 24 hour  Intake 20 ml  Output -  Net 20 ml   There were no vitals filed for this visit.  Examination: General: Lethargic,thin appearing elderly female on 2 L [place],  awake and following commands  HENT: NCAT, MM dry, No JVD Lungs: Bilateral chest excursion with clear BS, diminished per bases Cardiovascular: Atrial fibrillation per monitor, S1, S2, IRR, rate of 52, NO RMG Abdomen: LLQ pain to mild palpation, BS +, ND Extremities: Cool to touch, No obvious deformities  Neuro: Awake and alert top person, place and time , MAE x 4,appropriate GU: Foley Cath with minimal UO  Resolved Hospital Problem list     Assessment & Plan:   Hypovolemic shock vs Septic Shock Elevated Lactate BP responding to IVF No pressor need at present Total of 2700 cc in since arrival to ED Lactate 3.23  Plan Maintain MAP > 65 Add pressors as needed Trend Lactate until clear Trend Procalcitonin to guide antimicrobial treatment Source identification and treatment Fluid resuscitation Strict I&O Vanc and Zosyn per pharmacy  SB/ Atrial Fibrillation QTc 596 ms Abnormal T,?  ischemia, anterior leads ST elevation, consider inferior injury Prolonged QT interval  Hypotension Mag 1.7  Plan Tele monitoring MAP goal > 65 mm Hg Add pressors as needed Hold home Cardizem and Amiodarone for now Continuous monitoring QTc Check troponin EKG prn EKG 1/4 am Consider cards consult  CKD III Creatinine bump from 1.97 on  1/2 to 3.20 in 1/3 ( ? Hypoperfusion) Minimal UO since admission Plan Trend BMET daily Avoid nephrotoxic medications Maintain renal perfusion Strict I&O with Foley cath Renal US to rule out obstructive etiology ( ordered) Consider Renal consult if does not return to baseline after fluids.  LLQ abdominal pain ? Diverticulitis as source>> infectious vs inflammatory ? Ischemic bowel N&V Diarrhea Recent weight loss Lack of appetite  Plan CT abdomen and pelvis ( ordered) NPO for now Zofran prn nausea Consider C Diff analysis  Vanc and Zosyn per pharmacy Consider Surgical consult   ID Afebrile Leukocytosis LLQ pain Hypotension UA negative for  Nitrites Plan Trend fever curve and WBC Trend Procalcitonin Blood Cx  X 2 as ordered Follow up Micro  Follow up CT Abdomen results ABX as above  Respiratory No Acute Issues CXR with Atelectasis Plan Aggressive Pulmonary Toilet CXR prn   Pt is acutely ill with abdominal pain , N,V and Diarrhea resulting in hypotension requiring fluid resuscitation.She has had 3 hospital visits in the last 3 weeks for dehydration , anemia and atrial fibrillation in setting of chemotherapy for multiple myeloma. I had a goals of care discussion with the patient and her sister . She initially stated she  wanted everything done, including CPR, defib and intubation. Once CT abdomen resulted, ( colitis with inflammatory vs infection vs infarct) and she became aware she may need surgery, she decided she does not want aggressive care or intubation. Dr. Valeta Harms called her sister  [name] and discussed  this to her. Plan for now is for admission per [facility] as hypotension is resolving with fluids, surgical consult, and if no surgical option, comfort care. The ER team will call [facility]. PCCM is available as needed for assistance   Best practice:  Diet: NPO Pain/Anxiety/Delirium protocol (if indicated): per primary team VAP protocol (if indicated): NA DVT prophylaxis: per primary team GI prophylaxis: protonix Glucose control: CBG/ SSI per primary team Mobility:  Bedrest Code Status: Full Family Communication: Sister and patient updated at bedside 10/22/2018 Disposition: SDU, or per Primary team  Labs   CBC: Recent Labs  Lab 10/15/18 1406 10/16/18 0409 10/17/18 0648 10/21/18 1055 10/22/18 0815 10/22/18 0821  WBC 3.2* 3.1* 3.6* 4.2 13.7*  --   NEUTROABS 1.7  --   --  2.9 12.0*  --   HGB 9.0* 7.9* 8.1* 9.1* 10.0* 11.6*  HCT 28.9* 25.3* 25.5* 30.2* 33.9* 34.0*  MCV 96.3 94.1 93.1 97.7 98.0  --   PLT 195 202 206 271 323  --     Basic Metabolic Panel: Recent Labs  Lab 10/15/18 1406 10/16/18 0409  10/17/18 0648 10/21/18 1055 10/22/18 0815 10/22/18 0821  NA 139 138 139 140 136 140  K 3.7 4.2 4.6 3.7 3.5 3.6  CL 109 110 111 110 105 111  CO2 18* 19* 17* 19* 18*  --   GLUCOSE 68* 82 74 115* 137* 133*  BUN 11 11 7* 19 27* 26*  CREATININE 1.32* 1.17* 1.25* 1.97* 3.20* 3.20*  CALCIUM 9.2 8.6* 8.8* 9.1 8.9  --   MG  --   --   --   --  1.7  --    GFR: Estimated Creatinine Clearance: 14.5 mL/min (A) (by C-G formula based on SCr of 3.2 mg/dL (H)). Recent Labs  Lab 10/16/18 0409 10/17/18 0648 10/21/18 1055 10/22/18 0815 10/22/18 0821  WBC 3.1* 3.6* 4.2 13.7*  --   LATICACIDVEN  --   --   --   --  3.23*    Liver Function Tests: Recent Labs  Lab 10/15/18 1406 10/22/18 0815  AST 16 26  ALT 10 11  ALKPHOS 50 60  BILITOT 0.8 0.9  PROT 6.7 7.7  ALBUMIN 3.6 4.0   Recent Labs  Lab 10/22/18 0815  LIPASE 36   No results for input(s): AMMONIA in the last 168 hours.  ABG    Component Value Date/Time   TCO2 20 (L) 10/22/2018 6283     Coagulation Profile: No results for input(s): INR, PROTIME in the last 168 hours.  Cardiac Enzymes: No results for input(s): CKTOTAL, CKMB, CKMBINDEX, TROPONINI in the last 168 hours.  HbA1C: Hgb A1c MFr Bld  Date/Time Value Ref Range Status  12/31/2011 06:41 AM 5.7 (H) <5.7 % Final    Comment:    (NOTE)                                                                       According to the ADA Clinical Practice Recommendations for 2011, when HbA1c is used as a screening test:  >=6.5%   Diagnostic of Diabetes Mellitus           (if abnormal result is confirmed) 5.7-6.4%   Increased risk of developing Diabetes Mellitus References:Diagnosis and Classification of Diabetes Mellitus,Diabetes MOQH,4765,46(TKPTW 1):S62-S69 and Standards of Medical Care in         Diabetes - 2011,Diabetes SFKC,1275,17 (Suppl 1):S11-S61.    CBG: Recent Labs  Lab 10/18/18 1207 10/18/18 1704 10/18/18 2209 10/18/18 2339 10/19/18 0734  GLUCAP 80 74 68*  84 76    Review of Systems:   Gen: Denies fever, chills, + weight change, + fatigue, No night sweats HEENT: Denies blurred vision, double vision, hearing loss, tinnitus, sinus congestion, rhinorrhea, sore throat, neck stiffness, dysphagia PULM: Denies shortness of breath, cough, sputum production, hemoptysis, wheezing CV: Denies chest pain, edema, orthopnea, paroxysmal nocturnal dyspnea, palpitations GI: + LLQ abdominal pain, +nausea, +vomiting, +diarrhea, hematochezia, melena, constipation, + change in bowel habits GU: Denies dysuria, hematuria, polyuria, oliguria, urethral discharge, + decreased UO Endocrine: Denies hot or cold intolerance, polyuria, polyphagia or appetite change Derm: Denies rash, dry skin, scaling or peeling skin change Heme: Denies easy bruising, bleeding, bleeding gums Neuro: Denies headache, numbness, weakness, slurred speech, loss of memory or consciousness  Past Medical History  She,  has a past medical history of ACS (acute coronary syndrome) ([place]) (06/2017), Anemia, Anxiety, Bilateral renal artery stenosis (HCC) (1999), Chronic kidney disease (CKD), stage III (moderate) (HCC), Coronary artery disease (1999), Depression, Diverticulosis, Duodenitis, DVT (deep venous thrombosis) (HCC), Dyspnea, Gastric erosions, Gastric ulcer, GERD (gastroesophageal reflux disease), Heart murmur, History of blood transfusion (~ 03/2016), Hyperlipidemia, Hypertension, MI (myocardial infarction) ([place]) (1999), Multiple myeloma ([place]) (dx'd ~ 04/2016), PAT (paroxysmal atrial tachycardia) ([place]) (12/22/2016), Retinal hemorrhage, right eye, Schatzki's ring, Tachy-brady syndrome ([place]), and Tubular adenoma of colon.   Surgical History    Past Surgical History:  Procedure Laterality Date  . APPENDECTOMY    . BACK SURGERY    . CATARACT EXTRACTION W/ INTRAOCULAR LENS IMPLANT Right 2014  . COLONOSCOPY WITH PROPOFOL N/A 09/04/2017   Procedure: COLONOSCOPY WITH PROPOFOL;  Surgeon: Doran Stabler,  MD;  Location: [place];  Service: Gastroenterology;  Laterality: N/A;  .  CORONARY ANGIOGRAPHY N/A 01/23/2017   Procedure: Coronary Angiography;  Surgeon: [name], MD;  Location: [place] CV [facility];  Service: Cardiovascular;  Laterality: N/A;  . CORONARY ANGIOPLASTY WITH STENT PLACEMENT  1999  . CORONARY STENT INTERVENTION N/A 12/29/2016   Procedure: Coronary Stent Intervention;  Surgeon: Charolette Forward, MD;  Location: [place] [facility];  Service: Cardiovascular;  Laterality: N/A;  . CORONARY STENT INTERVENTION N/A 02/24/2017   Procedure: Coronary Stent Intervention;  Surgeon: Charolette Forward, MD;  Location: [place] [facility];  Service: Cardiovascular;  Laterality: N/A;  . ESOPHAGOGASTRODUODENOSCOPY N/A 11/03/2015   Procedure: ESOPHAGOGASTRODUODENOSCOPY (EGD);  Surgeon: Carol Ada, MD;  [facility];  Service: Endoscopy;  Laterality: N/A;  . ESOPHAGOGASTRODUODENOSCOPY N/A 09/03/2017   Procedure: ESOPHAGOGASTRODUODENOSCOPY (EGD);  Surgeon: Doran Stabler, MD;  Location: [place];  Service: Gastroenterology;  Laterality: N/A;  . ESOPHAGOGASTRODUODENOSCOPY (EGD) WITH PROPOFOL N/A 11/18/2016   Procedure: ESOPHAGOGASTRODUODENOSCOPY (EGD) WITH PROPOFOL;  Surgeon: Ladene Artist, MD;  Location: [facility] ENDOSCOPY;  Service: Endoscopy;  Laterality: N/A;  . LEFT HEART CATH AND CORONARY ANGIOGRAPHY N/A 12/24/2016   Procedure: Left Heart Cath and Coronary Angiography;  Surgeon: [name], MD;  Location: [place] [facility];  Service: Cardiovascular;  Laterality: N/A;  . LEFT HEART CATH AND CORONARY ANGIOGRAPHY N/A 02/24/2017   Procedure: Left Heart Cath and Coronary Angiography;  Surgeon: Charolette Forward, MD;  Location: [place] [facility];  Service: Cardiovascular;  Laterality: N/A;  . LEFT HEART CATH AND CORONARY ANGIOGRAPHY N/A 03/05/2017   Procedure: Left Heart Cath and Coronary Angiography;  Surgeon: [name], MD;  Location: [place] [facility];  Service: Cardiovascular;  Laterality:  N/A;  . LEFT HEART CATH AND CORONARY ANGIOGRAPHY N/A 09/02/2018   Procedure: LEFT HEART CATH AND CORONARY ANGIOGRAPHY;  Surgeon: [name], MD;  Location: [place] [facility];  Service: Cardiovascular;  Laterality: N/A;  . LUMBAR [place]    . RENAL ARTERY STENT  1999   bil RAS so ? bil vs unilateral stents  . RETINAL LASER PROCEDURE Right 2012   "bleeding"  . TONSILLECTOMY    . TOTAL ABDOMINAL HYSTERECTOMY       Social History   reports that she has been smoking cigarettes. She has a 25.00 pack-year smoking history. She has never used smokeless tobacco. She reports that she does not drink alcohol or use drugs.   Family History   Her family history includes Breast cancer in her maternal aunt, mother, and sister. There is no history of Colon cancer.   Allergies Allergies  Allergen Reactions  . Sulfa Antibiotics Rash     Home Medications  Prior to Admission medications   Medication Sig Start Date End Date Taking? Authorizing Provider  acyclovir (ZOVIRAX) 400 MG tablet TAKE 1 TABLET (400 MG TOTAL) BY MOUTH 2 (TWO) TIMES DAILY. 08/29/16   Ladell Pier, MD  amiodarone (PACERONE) 200 MG tablet Take 0.5 tablets (100 mg total) by mouth daily. 10/19/18   [name], MD  atorvastatin (LIPITOR) 80 MG tablet Take 0.5 tablets (40 mg total) by mouth daily at 6 PM. 10/03/18   Charolette Forward, MD  calcium carbonate (OS-CAL - DOSED IN MG OF ELEMENTAL CALCIUM) 1250 (500 Ca) MG tablet Take 1 tablet by mouth daily with breakfast.    [provider]  cyclophosphamide (CYTOXAN) 50 MG capsule Take 10 capsules ('500mg'$ ) by mouth once weekly for 3 weeks on, 1 week off, repeat every 4 weeks. Take early in the day. Maintain hydration.  10/28/18   Ladell Pier, MD  dexamethasone (DECADRON) 4 MG tablet Take 5 tablets (23m) weekly day of chemotherapy 10/21/18   SLadell Pier MD  diltiazem (CARDIZEM) 30 MG tablet Take 1 tablet (30 mg total) by mouth every 12 (twelve) hours. 10/19/18    KDixie Dials MD  feeding supplement, ENSURE ENLIVE, (ENSURE ENLIVE) LIQD Take 237 mLs by mouth 2 (two) times daily between meals. Patient taking differently: Take 237 mLs by mouth See admin instructions. Drink one bottle (237 ml) by mouth one or two times daily as a meal supplement 09/17/18   KDixie Dials MD  ferrous sulfate 325 (65 FE) MG tablet Take 1 tablet (325 mg total) by mouth daily with breakfast. 03/02/17   KDixie Dials MD  HYDROcodone-acetaminophen (NORCO/VICODIN) 5-325 MG tablet Take 1 tablet by mouth 2 (two) times daily as needed for moderate pain. 10/07/18   SLadell Pier MD  nitroGLYCERIN (NITROSTAT) 0.4 MG SL tablet Place 0.4 mg under the tongue every 5 (five) minutes as needed for chest pain. 03/20/17   [provider]  pantoprazole (PROTONIX) 40 MG tablet Take 1 tablet (40 mg total) by mouth 2 (two) times daily before a meal. 09/17/18   KDixie Dials MD  Potassium Chloride ER (K-TAB) 20 MEQ TBCR Take 20 mEq by mouth daily. 10/21/18   SLadell Pier MD  pyridostigmine (MESTINON) 60 MG tablet Take 0.5 tablets (30 mg total) by mouth 2 (two) times daily. 01/25/17   KDixie Dials MD  tiZANidine (ZANAFLEX) 4 MG tablet Take 4 mg by mouth at bedtime as needed for muscle spasms.  04/06/18   [provider]  ISOSORBIDE DINITRATE PO Take by mouth.  12/30/11  [provider]     Critical care time:     SMagdalen Spatz AGACNP-BC LYardvillePager # [phone] 3 pm call [phone] 10/22/2018 9:57 AM

## 2018-10-22 NOTE — ED Notes (Signed)
Bed: WA17 Expected date:  Expected time:  Means of arrival:  Comments: EMS-hypotension/cancer pt

## 2018-10-22 NOTE — Progress Notes (Signed)
PCCM:   Code status discussion with patient at bedside. I also called and spoke with the patients sister. The patient would not like aggressive measures. She would not like to be placed on life support and she would not want artificial support with blood pressure medications.  She understands that she has renal failure and I discussed her CT imaging results with her. I believe we can consider IV abx and continued fluid administration.  Garner Nash, DO [facility] 10/22/2018 12:25 PM  Personal pager: [phone] If unanswered, please page CCM On-call: [phone]

## 2018-10-22 NOTE — H&P (Addendum)
History and Physical  Patient Name: Abigail [name]     RJJ:[id number]    DOB: 07-16-1946    DOA: 10/22/2018 PCP: Nolene Ebbs, MD  Patient coming from: Home  Chief Complaint: Abdominal pain      HPI: MICHAELLA [name] is a 73 y.o. F with hx MM on Cytoxan, CKD baseline Cr 1.2, hx DVT and Afib recently stopped Eliquis and CAD who presents with worsening abdominal pain for 2 weeks, now severe weakness.  The patient has had slowly progressive colicky abdominal pain on the left side for 2-3 weeks without diarrhea or hematochezia.  She was recently admitted for abdominal pain and weakness, diagnosed with Afib with RVR, dehydration and discharged on new amiodarone 2 days ago.  Since then, she has been no better.  She is unable to walk, has severe worsening colicky left abdominal pain with new loose stools, anorexia, malaise, but no hematochezia or melena or fever.  Finally, the pain was severe so she came to the ER.  ED course: -HR 49, respirations and pulse ox normal, BP 88/52 -Na 136, K 3.5, Cr 3.2 (baseline 1.2), WBC 13.7K, Hgb 10 -Lactic acid 3.2 -CXR showed bilateral atelectasis no pneumonia or effusion -UA no leukocytes -ECG showed rate 98, sinus bradycardia with superimposed PACs, no ST changes; prolonged QTc -troponin 0.08 -Blood cultures were obtained, and vancomycin and cefepime were ordered for sepsis unclear source -CCM were consulted, but the patient declined aggressive interventions, central line, pressors, intubation and so the hospitalist service were asked to evalaute for admission -CT abdomen was obtained without contrast that showed left sided colitis, infectious versus ischemic -While in the ER, the patient had an episode of SVT, regular narrow complex tachycardia that resolved with valsalva    ROS: Review of Systems  Constitutional: Positive for malaise/fatigue. Negative for chills and fever.  Respiratory: Negative for cough, hemoptysis, sputum production, shortness of breath and  wheezing.   Cardiovascular: Negative for chest pain, palpitations, orthopnea and leg swelling.  Gastrointestinal: Positive for abdominal pain, diarrhea and nausea. Negative for blood in stool, constipation, melena and vomiting.  Genitourinary: Negative for dysuria and urgency.  All other systems reviewed and are negative.         Past Medical History:  Diagnosis Date  . ACS (acute coronary syndrome) ([place]) 06/2017  . Anemia    "off and on all my life" (12/22/2016)  . Anxiety   . Bilateral renal artery stenosis ([place]) 1999   s/p stenting  . Chronic kidney disease (CKD), stage III (moderate) ([place])    Archie Endo 12/22/2016  . Coronary artery disease 1999  . Depression   . Diverticulosis   . Duodenitis   . DVT (deep venous thrombosis) ([facility])    on Coumadin  . Dyspnea   . Gastric erosions   . Gastric ulcer   . GERD (gastroesophageal reflux disease)   . Heart murmur   . History of blood transfusion ~ 03/2016   "low HgB; practically nonexistent"  . Hyperlipidemia   . Hypertension   . MI (myocardial infarction) ([place]) 1999  . Multiple myeloma ([place]) dx'd ~ 04/2016  . PAT (paroxysmal atrial tachycardia) ([place]) 12/22/2016  . Retinal hemorrhage, right eye   . Schatzki's ring   . Tachy-brady syndrome ([place])    Archie Endo 12/22/2016  . Tubular adenoma of colon     Past Surgical History:  Procedure Laterality Date  . APPENDECTOMY    . BACK SURGERY    . CATARACT EXTRACTION W/ INTRAOCULAR LENS IMPLANT Right  2014  . COLONOSCOPY WITH PROPOFOL N/A 09/04/2017   Procedure: COLONOSCOPY WITH PROPOFOL;  Surgeon: Doran Stabler, MD;  Location: [facility];  Service: Gastroenterology;  Laterality: N/A;  . CORONARY ANGIOGRAPHY N/A 01/23/2017   Procedure: Coronary Angiography;  Surgeon: [name], MD;  Location: [facility];  Service: Cardiovascular;  Laterality: N/A;  . CORONARY ANGIOPLASTY WITH STENT PLACEMENT  1999  . CORONARY STENT INTERVENTION N/A 12/29/2016   Procedure: Coronary Stent  Intervention;  Surgeon: Charolette Forward, MD;  Location: [facility];  Service: Cardiovascular;  Laterality: N/A;  . CORONARY STENT INTERVENTION N/A 02/24/2017   Procedure: Coronary Stent Intervention;  Surgeon: Charolette Forward, MD;  Location: [facility];  Service: Cardiovascular;  Laterality: N/A;  . ESOPHAGOGASTRODUODENOSCOPY N/A 11/03/2015   Procedure: ESOPHAGOGASTRODUODENOSCOPY (EGD);  Surgeon: Carol Ada, MD;  Location[facility];  Service: Endoscopy;  Laterality: N/A;  . ESOPHAGOGASTRODUODENOSCOPY N/A 09/03/2017   Procedure: ESOPHAGOGASTRODUODENOSCOPY (EGD);  Surgeon: Doran Stabler, MD;  Location: [facility];  Service: Gastroenterology;  Laterality: N/A;  . ESOPHAGOGASTRODUODENOSCOPY (EGD) WITH PROPOFOL N/A 11/18/2016   Procedure: ESOPHAGOGASTRODUODENOSCOPY (EGD) WITH PROPOFOL;  Surgeon: Ladene Artist, MD;  Location: [facility];  Service: Endoscopy;  Laterality: N/A;  . LEFT HEART CATH AND CORONARY ANGIOGRAPHY N/A 12/24/2016   Procedure: Left Heart Cath and Coronary Angiography;  Surgeon: [name], MD;  Location: [facility];  Service: Cardiovascular;  Laterality: N/A;  . LEFT HEART CATH AND CORONARY ANGIOGRAPHY N/A 02/24/2017   Procedure: Left Heart Cath and Coronary Angiography;  Surgeon: Charolette Forward, MD;  Location: [facility];  Service: Cardiovascular;  Laterality: N/A;  . LEFT HEART CATH AND CORONARY ANGIOGRAPHY N/A 03/05/2017   Procedure: Left Heart Cath and Coronary Angiography;  Surgeon: [name], MD;  Location: [facility];  Service: Cardiovascular;  Laterality: N/A;  . LEFT HEART CATH AND CORONARY ANGIOGRAPHY N/A 09/02/2018   Procedure: LEFT HEART CATH AND CORONARY ANGIOGRAPHY;  Surgeon: [name], MD;  Location: [facility];  Service: Cardiovascular;  Laterality: N/A;  . LUMBAR [place]    . RENAL ARTERY STENT  1999   bil RAS so ? bil vs unilateral stents  . RETINAL LASER PROCEDURE Right 2012   "bleeding"  .  TONSILLECTOMY    . TOTAL ABDOMINAL HYSTERECTOMY      Social History: Patient lives alone.  The patient walks unassisted at baseline.  No dementia.  Nonsmoker.  Allergies  Allergen Reactions  . Sulfa Antibiotics Rash    Family history: family history includes Breast cancer in her maternal aunt, mother, and sister.  Prior to Admission medications   Medication Sig Start Date End Date Taking? Authorizing Provider  acyclovir (ZOVIRAX) 400 MG tablet TAKE 1 TABLET (400 MG TOTAL) BY MOUTH 2 (TWO) TIMES DAILY. 08/29/16  Yes Ladell Pier, MD  amiodarone (PACERONE) 200 MG tablet Take 0.5 tablets (100 mg total) by mouth daily. 10/19/18  Yes [name], MD  atorvastatin (LIPITOR) 80 MG tablet Take 0.5 tablets (40 mg total) by mouth daily at 6 PM. 10/03/18  Yes Charolette Forward, MD  calcium carbonate (OS-CAL - DOSED IN MG OF ELEMENTAL CALCIUM) 1250 (500 Ca) MG tablet Take 1 tablet by mouth daily with breakfast.   Yes [provider]  diltiazem (CARDIZEM) 30 MG tablet Take 1 tablet (30 mg total) by mouth every 12 (twelve) hours. 10/19/18  Yes [name], MD  feeding supplement, ENSURE ENLIVE, (ENSURE ENLIVE) LIQD Take 237  mLs by mouth 2 (two) times daily between meals. Patient taking differently: Take 237 mLs by mouth See admin instructions. Drink one bottle (237 ml) by mouth one or two times daily as a meal supplement 09/17/18  Yes [name], MD  ferrous sulfate 325 (65 FE) MG tablet Take 1 tablet (325 mg total) by mouth daily with breakfast. 03/02/17  Yes [name], MD  HYDROcodone-acetaminophen (NORCO/VICODIN) 5-325 MG tablet Take 1 tablet by mouth 2 (two) times daily as needed for moderate pain. 10/07/18  Yes Ladell Pier, MD  pantoprazole (PROTONIX) 40 MG tablet Take 1 tablet (40 mg total) by mouth 2 (two) times daily before a meal. 09/17/18  Yes [name], MD  Potassium Chloride ER (K-TAB) 20 MEQ TBCR Take 20 mEq by mouth daily. 10/21/18  Yes Ladell Pier, MD    pyridostigmine (MESTINON) 60 MG tablet Take 0.5 tablets (30 mg total) by mouth 2 (two) times daily. 01/25/17  Yes [name], MD  tiZANidine (ZANAFLEX) 4 MG tablet Take 4 mg by mouth at bedtime as needed for muscle spasms.  04/06/18  Yes [provider]  cyclophosphamide (CYTOXAN) 50 MG capsule Take 10 capsules (569m) by mouth once weekly for 3 weeks on, 1 week off, repeat every 4 weeks. Take early in the day. Maintain hydration. 10/28/18   SLadell Pier MD  dexamethasone (DECADRON) 4 MG tablet Take 5 tablets (224m weekly day of chemotherapy 10/21/18   ShLadell PierMD  nitroGLYCERIN (NITROSTAT) 0.4 MG SL tablet Place 0.4 mg under the tongue every 5 (five) minutes as needed for chest pain. 03/20/17   [provider]  ISOSORBIDE DINITRATE PO Take by mouth.  12/30/11  [provider]       Physical Exam: BP 122/86   Pulse (!) 33   Resp 14   Ht '5\' 5"'  (1.651 m)   Wt 69.9 kg   SpO2 91%   BMI 25.64 kg/m  General appearance: Thin elderly adult female, alert and in no acute distress but appears tired.   Eyes: Anicteric, conjunctiva pink, lids and lashes normal. PERRL.    ENT: No nasal deformity, discharge, epistaxis.  Hearing normal. OP moist without lesions.  Edentulous, lips normal. Neck: No neck masses.  Trachea midline.  No thyromegaly/tenderness. Lymph: No cervical or supraclavicular lymphadenopathy. Skin: Warm and dry.    No suspicious rashes or lesions. Cardiac: Slow, regular, nl S1-S2, no murmurs appreciated.  Capillary refill is brisk.  JVP not visible.  No LE edema.  Radial pulses 2+ and symmetric. Respiratory: Normal respiratory rate and rhythm.  CTAB without rales or wheezes. Abdomen: Abdomen soft.  No right sided but severe left sided TTP with voluntary guarding, no rigidity or rebound. No ascites, distension, hepatosplenomegaly.   MSK: No deformities or effusions of the large joints of the upper or lower extremities bilaterally.  No cyanosis or  clubbing.  Diffuse loss of subcutaneous muscle mass and fat Neuro: Cranial nerves 3-12 intact.  Sensation intact to light touch. Speech is fluent.  Muscle strength 5-/5 and symmetric.    Psych: Sensorium intact and responding to questions, attention normal.  Behavior appropriate.  Affect normal.  Judgment and insight appear normal.     Labs on Admission:  I have personally reviewed following labs and imaging studies: CBC: Recent Labs  Lab 10/16/18 0409 10/17/18 0648 10/21/18 1055 10/22/18 0815 10/22/18 0821  WBC 3.1* 3.6* 4.2 13.7*  --   NEUTROABS  --   --  2.9 12.0*  --  HGB 7.9* 8.1* 9.1* 10.0* 11.6*  HCT 25.3* 25.5* 30.2* 33.9* 34.0*  MCV 94.1 93.1 97.7 98.0  --   PLT 202 206 271 323  --    Basic Metabolic Panel: Recent Labs  Lab 10/16/18 0409 10/17/18 0648 10/21/18 1055 10/22/18 0815 10/22/18 0821  NA 138 139 140 136 140  K 4.2 4.6 3.7 3.5 3.6  CL 110 111 110 105 111  CO2 19* 17* 19* 18*  --   GLUCOSE 82 74 115* 137* 133*  BUN 11 7* 19 27* 26*  CREATININE 1.17* 1.25* 1.97* 3.20* 3.20*  CALCIUM 8.6* 8.8* 9.1 8.9  --   MG  --   --   --  1.7  --    GFR: Estimated Creatinine Clearance: 15.8 mL/min (A) (by C-G formula based on SCr of 3.2 mg/dL (H)).  Liver Function Tests: Recent Labs  Lab 10/22/18 0815  AST 26  ALT 11  ALKPHOS 60  BILITOT 0.9  PROT 7.7  ALBUMIN 4.0   Recent Labs  Lab 10/22/18 0815  LIPASE 36   No results for input(s): AMMONIA in the last 168 hours. Coagulation Profile: No results for input(s): INR, PROTIME in the last 168 hours. Cardiac Enzymes: Recent Labs  Lab 10/22/18 1553  TROPONINI 0.11*   BNP (last 3 results) No results for input(s): PROBNP in the last 8760 hours. HbA1C: No results for input(s): HGBA1C in the last 72 hours. CBG: Recent Labs  Lab 10/18/18 1207 10/18/18 1704 10/18/18 2209 10/18/18 2339 10/19/18 0734  GLUCAP 80 74 68* 84 76   Lipid Profile: No results for input(s): CHOL, HDL, LDLCALC, TRIG,  CHOLHDL, LDLDIRECT in the last 72 hours. Thyroid Function Tests: No results for input(s): TSH, T4TOTAL, FREET4, T3FREE, THYROIDAB in the last 72 hours. Anemia Panel: No results for input(s): VITAMINB12, FOLATE, FERRITIN, TIBC, IRON, RETICCTPCT in the last 72 hours. Sepsis Labs: Lactic acid 3.2 --> 2.7  Recent Results (from the past 240 hour(s))  Blood culture (routine x 2)     Status: None (Preliminary result)   Collection Time: 10/22/18  8:21 AM  Result Value Ref Range Status   Specimen Description   Final    BLOOD RIGHT ARM Performed at [facility] [address], [place], [place] [address]    Special Requests   Final    BOTTLES DRAWN AEROBIC AND ANAEROBIC Blood Culture adequate volume Performed at [facility] [address] [place], [place] [address]    Culture   Final    NO GROWTH < 12 HOURS Performed at [facility] [address] [place], [place] [address]    Report Status PENDING  Incomplete  Blood culture (routine x 2)     Status: None (Preliminary result)   Collection Time: 10/22/18  8:26 AM  Result Value Ref Range Status   Specimen Description BLOOD LEFT FOOT  Final   Special Requests   Final    BOTTLES DRAWN AEROBIC ONLY Blood Culture results may not be optimal due to an inadequate volume of blood received in culture bottles   Culture   Final    NO GROWTH < 12 HOURS Performed at [facility], [place] [address], [place], [place] [address]    Report Status PENDING  Incomplete  MRSA PCR Screening     Status: None   Collection Time: 10/22/18  3:18 PM  Result Value Ref Range Status   MRSA by PCR NEGATIVE NEGATIVE Final    Comment:  The GeneXpert MRSA Assay (FDA approved for NASAL specimens only), is one component of a comprehensive MRSA colonization surveillance program. It is not intended to diagnose MRSA infection nor to guide or monitor treatment for MRSA infections. Performed at [facility], [place] [address], [place], [place] [address]            Radiological Exams on Admission: Personally reviewed CXR showed no pneumonia, CT abdomen report reviewed and discussed with radiology: Ct Abdomen Pelvis Wo Contrast  Result Date: 10/22/2018 CLINICAL DATA:  Weakness, abdominal pain EXAM: CT ABDOMEN AND PELVIS WITHOUT CONTRAST TECHNIQUE: Multidetector CT imaging of the abdomen and pelvis was performed following the standard protocol without IV contrast. COMPARISON:  09/24/2018 FINDINGS: Lower chest: Bibasilar atelectasis. Normal heart size. Atherosclerosis noted. No pericardial or pleural effusion. No large hiatal hernia. Hepatobiliary: No focal liver abnormality is seen. No gallstones, gallbladder wall thickening, or biliary dilatation. Pancreas: Unremarkable. No pancreatic ductal dilatation or surrounding inflammatory changes. Spleen: Normal in size.  Accessory splenules noted. Adrenals/Urinary Tract: Normal adrenal glands. No acute renal obstruction, hydronephrosis, obstructing ureteral calculus, or UVJ abnormality. Bilateral renal artery stents noted. Similar hypodense and hyperdense left renal cysts are present, largest measures 2.2 cm laterally. Stomach/Bowel: Stomach and small bowel demonstrate no significant finding, ileus or obstruction pattern. Appendix not visualized with certainty. Wall thickening and surrounding edema noted of the colon extending from the splenic flexure and the proximal first half of the left descending colon compatible with acute left colitis. Mild distention of the transverse colon may represent associated mild ileus related to the colitis. More distal sigmoid and rectum demonstrate diverticulosis without wall thickening. No evidence of perforation, abscess, hemorrhage or hematoma. Small amount of pelvic free fluid likely related to the colitis. Vascular/Lymphatic: Atherosclerosis of the aorta and abdominal vasculature. Negative for aneurysm. No  adenopathy. Reproductive: Status post hysterectomy. No adnexal masses. Other: Intact abdominal wall. Negative for hernia. Trace pelvic ascites. Musculoskeletal: Degenerative changes noted of the spine. Stable retrolisthesis of T12 upon L1 and L1 upon L2 related to degenerative change. Stable myeloma changes at T12. No acute compression fracture or significant interval change. IMPRESSION: Acute left colitis extending from the splenic flexure and involving the proximal first half of the left descending colon which may be related to infectious/inflammatory etiology versus ischemic. No associated definite obstruction pattern, free air, or pneumatosis. Trace pelvic ascites, suspect related to the colitis. No fluid collection or abscess Electronically Signed   By: Jerilynn Mages.  Shick M.D.   On: 10/22/2018 11:32   Dg Chest Port 1 View  Result Date: 10/22/2018 CLINICAL DATA:  Chest pain, abdominal pain, weakness, history multiple myeloma, coronary artery disease post MI, hypertension, arrhythmia is, smoker EXAM: PORTABLE CHEST 1 VIEW COMPARISON:  Portable exam 0803 hours compared to 10/08/2018 FINDINGS: Upper normal heart size. Atherosclerotic calcification aorta. Mediastinal contours and pulmonary vascularity normal. Minimal bibasilar atelectasis. Lungs otherwise clear. No infiltrate, pleural effusion or pneumothorax. Inferior spur formation RIGHT acromion with adjacent nonspecific soft tissue calcification. IMPRESSION: Bibasilar atelectasis. Electronically Signed   By: Lavonia Dana M.D.   On: 10/22/2018 08:35    EKG: Independently reviewed. Initial ECG had leads incorrectly placed, unreliable interpretation.  Repeat showed regular narrow complex tachycardia.  Repeat after valsalva showed rate 98, QTc 518, sinus bradycardia with PACs, normal ST segments.       Assessment/Plan  Hypotension from septic versus hypovolemic shock Suspected source colitis. Organism unknown.   Patient meets criteria given tachycardia,  tachypnea, fever, leukocytosis, and evidence of  organ dysfunction.  Lactate 5.1 mmol/L and repeat ordered within 6 hours, 30 cc/kg bolus given in ER, antibiotics administered in ER.  CCM disucssed code status with patient, sister, they desired fluids and antibiotics but no escalation of care.   -Follow blood culture data -Obtain cdiff panel and GI pathogen panel  -Continue vancomycin and Zosyn -Continue oral vancomycin   Abdominal pain from colitis CT consistent with infectious or ischemic colitis.  She is not aware of an association with food, and given the slow time course and prominent pain and immune suppression, I suspect infection rather than watershed ischemic colitis or mesenteric isch (and her CT with contrast in Dec showed patent IMA and celiac, 50% SMA). -Consult General Surgery -Antibiotics as above -Bowel rest -IV fluids -If pain worsening overnight, or lactic acid does not improve with fluids, will start empiric anticoagulation with heparin Gtt and obtain US abdomen with doppler and GI consult  Acute kidney injury Likely pre-renal, UA bland. -Check urine electrolytes -IV fluids and trend Cr and UOP  Bradycardia -Hold amio and diltiazem agents -Monitor on telemetry  Prolonged QTC -Hold amiodarone  SVT Brief and ended with valsalva  -Keep Mag >2, K >4  Multiple myeloma -Hold Cytoxan -Continue acyclovir -Consult to Oncology, appreicate cares  Anemia of chronic disease Stable relative to baseline  Atrial fibrillation, permanent Not on anticoagulation, unclear why.  Coronary artery disease  Other medications -Continue PPI -Continue Mestinon -Continue Zanaflex, Percocet      DVT prophylaxis: Lovenox ppx dose  Code Status: DO NOT RESUSCITATE  Family Communication: Sister at bedisde  Disposition Plan: Anticipate IV fluids, IV antibiotics, stool studies.  If pain worsening or lactate worsening, could start heparin and obtain dopplers.  Prognosis is very  guarded.   Consults called: General Surgery and CCM Admission status: INAPTIENT, stepdown      Medical decision making: Patient seen at 1:00 PM on 10/22/2018.  The patient was discussed with Dr. Valeta Harms.  What exists of the patient's chart was reviewed in depth and summarized above.  Clinical condition: requiring ongoing fluids for maintaining BP at normal, but mentation good, respiratory status good.     At the time of admission, it appears that the appropriate admission status for this patient is INPATIENT. This is judged to be reasonable and necessary in order to provide the required intensity of service to ensure the patient's safety given: -presenting symptoms of abdominal pain -physical exam findings of tenderness of abdomen severely with guarding, hypotension, bradycardia, and  -initial radiographic and laboratory data elevated lactic acid, leukocytosis, colitis on CT -in the context of their chronic comorbidities of multiple myeloma on Cytoxan    Together, these circumstances are felt to place her at high risk for further clinical deterioration threatening life, limb, or organ requiring a high intensity of service due to this acute illness that poses a threat to life, limb or bodily function.  I certify that at the point of admission it is my clinical judgment that the patient will require inpatient hospital care spanning beyond 2 midnights from the point of admission and that early discharge would result in unnecessary risk of decompensation and readmission or threat to life, limb or bodily function.     CRITICAL CARE Performed by: Edwin Dada   Total critical care time: 60 minutes  Critical care time was exclusive of separately billable procedures and treating other patients.  Critical care was necessary to treat or prevent imminent or life-threatening deterioration.  Critical care was time  spent personally by me on the following activities: development of  treatment plan with patient and/or surrogate as well as nursing, discussions with consultants, evaluation of patient's response to treatment, examination of patient, obtaining history from patient or surrogate, ordering and performing treatments and interventions, ordering and review of laboratory studies, ordering and review of radiographic studies, pulse oximetry and re-evaluation of patient's condition.    [place] [facility] Pager: please page via [email or web address], enter password "[email or web address]", and identify attending under [facility]

## 2018-10-22 NOTE — Progress Notes (Addendum)
CRITICAL VALUE ALERT  Critical Value: Lactic acid 5.4, Troponin 0.11  Date & Time Notied:  8599  Provider Notified: Dr. Loleta Books 1700  Orders received: [place], repeat Lactic acid this PM

## 2018-10-22 NOTE — Progress Notes (Signed)
CRITICAL VALUE ALERT  Critical Value: troponin 0.18; lactic acid 2.1 Date & Time Notied: 10/22/2018 2056 Provider Notified: Kennon Holter Orders Received/Actions taken: continue to ,monitor

## 2018-10-22 NOTE — ED Triage Notes (Signed)
Patient BIB EMS from home with complaints of weakness and abdominal pain. Patient reports hx of multiple myeloma with chronic abdominal pain. Patient was seen at [facility] yesterday. Patient reports x1 episode of diarrhea and multiple episodes of vomiting over the last 12 hours. EMS reports 82m Hg palpate, bradycardic with history of Afib at 50bpm. Patient very hypotensive in triage. Patient AxOx4. CBG for EMS = 151. EMS was not able to get an IV or give fluids en route. Patient in trendelenburg. Patient reports she usually has high blood pressure.

## 2018-10-22 NOTE — Consult Note (Signed)
[facility] Surgery Consult Note  Abigail [name] [birth year]  [id number].    Requesting MD: Myrene Buddy Chief Complaint/Reason for Consult: colitis  HPI:  [name] is a 73yo female with multiple medical problems including multiple myeloma on chemotherapy, who was admitted to [facility] today with persistent abdominal pain. States that over the last month she has had intermittent left sided abdominal pain. She was scheduled for chemotherapy earlier this week but that was held due to abdominal pain and elevated creatinine. Last night her pain became more severe and associated with nausea, vomiting, and non-bloody diarrhea. In the ED she was found to be bradycardic and hypotensive. Vital signs improved some with IVF resuscitation. WBC 13.7, lactic acid 3.23, hemoglobin 10.0, creatinine 3.2. CT abdomen/pelvis without contrast revealed acute left colitis extending from the splenic flexure and involving the proximal first half of the left descending colon, infectious/inflammatory etiology versus ischemic; no free air or pneumatosis.  Patient was admitted to the ICU and started on broad spectrum antibiotics. General surgery asked to see.  -PMH significant for multiple myeloma, HTN, HLD, A fib, GERD, CKD, CAD s/p cardiac stent x2, h/o DVT -Abdominal surgical history: hysterectomy, appendectomy -Last colonoscopy 09/04/17 Dr. Loletha Carrow: left and right colon diverticulosis -Anticoagulants: none currently -Lives at home alone -Has a walker and cane available but typically ambulates without assistive device  ROS: Review of Systems  Constitutional: Positive for malaise/fatigue.  HENT: Negative.   Eyes: Negative.   Respiratory: Negative.   Cardiovascular: Negative.   Gastrointestinal: Positive for abdominal pain, diarrhea, nausea and vomiting. Negative for blood in stool, constipation and melena.  Genitourinary: Negative.   Musculoskeletal: Negative.   Skin: Negative.   Neurological: Negative.      All systems reviewed and otherwise negative except for as above  Family History  Problem Relation Age of Onset  . Breast cancer Mother   . Breast cancer Sister   . Breast cancer Maternal Aunt   . Colon cancer Neg Hx     Past Medical History:  Diagnosis Date  . ACS (acute coronary syndrome) ([place]) 06/2017  . Anemia    "off and on all my life" (12/22/2016)  . Anxiety   . Bilateral renal artery stenosis ([place]) 1999   s/p stenting  . Chronic kidney disease (CKD), stage III (moderate) ([place])    Archie Endo 12/22/2016  . Coronary artery disease 1999  . Depression   . Diverticulosis   . Duodenitis   . DVT (deep venous thrombosis) ([facility])    on Coumadin  . Dyspnea   . Gastric erosions   . Gastric ulcer   . GERD (gastroesophageal reflux disease)   . Heart murmur   . History of blood transfusion ~ 03/2016   "low HgB; practically nonexistent"  . Hyperlipidemia   . Hypertension   . MI (myocardial infarction) ([place]) 1999  . Multiple myeloma ([place]) dx'd ~ 04/2016  . PAT (paroxysmal atrial tachycardia) ([place]) 12/22/2016  . Retinal hemorrhage, right eye   . Schatzki's ring   . Tachy-brady syndrome ([place])    Archie Endo 12/22/2016  . Tubular adenoma of colon     Past Surgical History:  Procedure Laterality Date  . APPENDECTOMY    . BACK SURGERY    . CATARACT EXTRACTION W/ INTRAOCULAR LENS IMPLANT Right 2014  . COLONOSCOPY WITH PROPOFOL N/A 09/04/2017   Procedure: COLONOSCOPY WITH PROPOFOL;  Surgeon: Doran Stabler, MD;  Location: [place];  Service: Gastroenterology;  Laterality: N/A;  . CORONARY ANGIOGRAPHY N/A 01/23/2017  Procedure: Coronary Angiography;  Surgeon: [name], MD;  Location: [place] [facility];  Service: Cardiovascular;  Laterality: N/A;  . CORONARY ANGIOPLASTY WITH STENT PLACEMENT  1999  . CORONARY STENT INTERVENTION N/A 12/29/2016   Procedure: Coronary Stent Intervention;  Surgeon: Charolette Forward, MD;  Location: [place] [facility];  Service: Cardiovascular;  Laterality:  N/A;  . CORONARY STENT INTERVENTION N/A 02/24/2017   Procedure: Coronary Stent Intervention;  Surgeon: Charolette Forward, MD;  Location: [place] [facility];  Service: Cardiovascular;  Laterality: N/A;  . ESOPHAGOGASTRODUODENOSCOPY N/A 11/03/2015   Procedure: ESOPHAGOGASTRODUODENOSCOPY (EGD);  Surgeon: Carol Ada, MD;  Location: [facility] ENDOSCOPY;  Service: Endoscopy;  Laterality: N/A;  . ESOPHAGOGASTRODUODENOSCOPY N/A 09/03/2017   Procedure: ESOPHAGOGASTRODUODENOSCOPY (EGD);  Surgeon: Doran Stabler, MD;  Location: [place];  Service: Gastroenterology;  Laterality: N/A;  . ESOPHAGOGASTRODUODENOSCOPY (EGD) WITH PROPOFOL N/A 11/18/2016   Procedure: ESOPHAGOGASTRODUODENOSCOPY (EGD) WITH PROPOFOL;  Surgeon: Ladene Artist, MD;  Location: [facility];  Service: Endoscopy;  Laterality: N/A;  . LEFT HEART CATH AND CORONARY ANGIOGRAPHY N/A 12/24/2016   Procedure: Left Heart Cath and Coronary Angiography;  Surgeon: [name], MD;  Location: [place] [facility];  Service: Cardiovascular;  Laterality: N/A;  . LEFT HEART CATH AND CORONARY ANGIOGRAPHY N/A 02/24/2017   Procedure: Left Heart Cath and Coronary Angiography;  Surgeon: Charolette Forward, MD;  Location: [place] [facility];  Service: Cardiovascular;  Laterality: N/A;  . LEFT HEART CATH AND CORONARY ANGIOGRAPHY N/A 03/05/2017   Procedure: Left Heart Cath and Coronary Angiography;  Surgeon: [name], MD;  Location: [place] [facility];  Service: Cardiovascular;  Laterality: N/A;  . LEFT HEART CATH AND CORONARY ANGIOGRAPHY N/A 09/02/2018   Procedure: LEFT HEART CATH AND CORONARY ANGIOGRAPHY;  Surgeon: [name], MD;  Location: [place] [facility];  Service: Cardiovascular;  Laterality: N/A;  . LUMBAR [place]    . RENAL ARTERY STENT  1999   bil RAS so ? bil vs unilateral stents  . RETINAL LASER PROCEDURE Right 2012   "bleeding"  . TONSILLECTOMY    . TOTAL ABDOMINAL HYSTERECTOMY      Social History:  reports that she has been smoking  cigarettes. She has a 25.00 pack-year smoking history. She has never used smokeless tobacco. She reports that she does not drink alcohol or use drugs.  Allergies:  Allergies  Allergen Reactions  . Sulfa Antibiotics Rash    Medications Prior to Admission  Medication Sig Dispense Refill  . acyclovir (ZOVIRAX) 400 MG tablet TAKE 1 TABLET (400 MG TOTAL) BY MOUTH 2 (TWO) TIMES DAILY. 60 tablet 2  . amiodarone (PACERONE) 200 MG tablet Take 0.5 tablets (100 mg total) by mouth daily.    Marland Kitchen atorvastatin (LIPITOR) 80 MG tablet Take 0.5 tablets (40 mg total) by mouth daily at 6 PM. 30 tablet 3  . calcium carbonate (OS-CAL - DOSED IN MG OF ELEMENTAL CALCIUM) 1250 (500 Ca) MG tablet Take 1 tablet by mouth daily with breakfast.    . diltiazem (CARDIZEM) 30 MG tablet Take 1 tablet (30 mg total) by mouth every 12 (twelve) hours. 60 tablet 1  . feeding supplement, ENSURE ENLIVE, (ENSURE ENLIVE) LIQD Take 237 mLs by mouth 2 (two) times daily between meals. (Patient taking differently: Take 237 mLs by mouth See admin instructions. Drink one bottle (237 ml) by mouth one or two times daily as a meal supplement) 60 Bottle 12  . ferrous sulfate 325 (65 FE) MG tablet Take 1 tablet (325 mg  total) by mouth daily with breakfast. 30 tablet 3  . HYDROcodone-acetaminophen (NORCO/VICODIN) 5-325 MG tablet Take 1 tablet by mouth 2 (two) times daily as needed for moderate pain. 60 tablet 0  . pantoprazole (PROTONIX) 40 MG tablet Take 1 tablet (40 mg total) by mouth 2 (two) times daily before a meal. 60 tablet 3  . Potassium Chloride ER (K-TAB) 20 MEQ TBCR Take 20 mEq by mouth daily. 30 tablet 2  . pyridostigmine (MESTINON) 60 MG tablet Take 0.5 tablets (30 mg total) by mouth 2 (two) times daily. 30 tablet 3  . tiZANidine (ZANAFLEX) 4 MG tablet Take 4 mg by mouth at bedtime as needed for muscle spasms.   2  . [START ON 10/28/2018] cyclophosphamide (CYTOXAN) 50 MG capsule Take 10 capsules ('500mg'$ ) by mouth once weekly for 3 weeks on,  1 week off, repeat every 4 weeks. Take early in the day. Maintain hydration. 30 capsule 0  . dexamethasone (DECADRON) 4 MG tablet Take 5 tablets ('20mg'$ ) weekly day of chemotherapy 15 tablet 1  . nitroGLYCERIN (NITROSTAT) 0.4 MG SL tablet Place 0.4 mg under the tongue every 5 (five) minutes as needed for chest pain.  1    Prior to Admission medications   Medication Sig Start Date End Date Taking? Authorizing Provider  acyclovir (ZOVIRAX) 400 MG tablet TAKE 1 TABLET (400 MG TOTAL) BY MOUTH 2 (TWO) TIMES DAILY. 08/29/16  Yes Ladell Pier, MD  amiodarone (PACERONE) 200 MG tablet Take 0.5 tablets (100 mg total) by mouth daily. 10/19/18  Yes [name], MD  atorvastatin (LIPITOR) 80 MG tablet Take 0.5 tablets (40 mg total) by mouth daily at 6 PM. 10/03/18  Yes Charolette Forward, MD  calcium carbonate (OS-CAL - DOSED IN MG OF ELEMENTAL CALCIUM) 1250 (500 Ca) MG tablet Take 1 tablet by mouth daily with breakfast.   Yes [provider]  diltiazem (CARDIZEM) 30 MG tablet Take 1 tablet (30 mg total) by mouth every 12 (twelve) hours. 10/19/18  Yes [name], MD  feeding supplement, ENSURE ENLIVE, (ENSURE ENLIVE) LIQD Take 237 mLs by mouth 2 (two) times daily between meals. Patient taking differently: Take 237 mLs by mouth See admin instructions. Drink one bottle (237 ml) by mouth one or two times daily as a meal supplement 09/17/18  Yes [name], MD  ferrous sulfate 325 (65 FE) MG tablet Take 1 tablet (325 mg total) by mouth daily with breakfast. 03/02/17  Yes [name], MD  HYDROcodone-acetaminophen (NORCO/VICODIN) 5-325 MG tablet Take 1 tablet by mouth 2 (two) times daily as needed for moderate pain. 10/07/18  Yes Ladell Pier, MD  pantoprazole (PROTONIX) 40 MG tablet Take 1 tablet (40 mg total) by mouth 2 (two) times daily before a meal. 09/17/18  Yes [name], MD  Potassium Chloride ER (K-TAB) 20 MEQ TBCR Take 20 mEq by mouth daily. 10/21/18  Yes Ladell Pier, MD   pyridostigmine (MESTINON) 60 MG tablet Take 0.5 tablets (30 mg total) by mouth 2 (two) times daily. 01/25/17  Yes [name], MD  tiZANidine (ZANAFLEX) 4 MG tablet Take 4 mg by mouth at bedtime as needed for muscle spasms.  04/06/18  Yes [provider]  cyclophosphamide (CYTOXAN) 50 MG capsule Take 10 capsules ('500mg'$ ) by mouth once weekly for 3 weeks on, 1 week off, repeat every 4 weeks. Take early in the day. Maintain hydration. 10/28/18   Ladell Pier, MD  dexamethasone (DECADRON) 4 MG tablet Take 5 tablets ('20mg'$ ) weekly day of chemotherapy  10/21/18   Ladell Pier, MD  nitroGLYCERIN (NITROSTAT) 0.4 MG SL tablet Place 0.4 mg under the tongue every 5 (five) minutes as needed for chest pain. 03/20/17   [provider]  ISOSORBIDE DINITRATE PO Take by mouth.  12/30/11  [provider]    Blood pressure 95/66, pulse (!) 57, resp. rate 12, height '5\' 5"'$  (1.651 m), weight 69.9 kg, SpO2 99 %. Physical Exam: General: pleasant, chronically ill appearing AA female who is laying in bed in NAD HEENT: head is normocephalic, atraumatic.  Sclera are noninjected.  Pupils equal and round.  Ears and nose without any masses or lesions.  Mouth is dry. Dentition fair Heart: irregular.  No obvious murmurs, gallops, or rubs noted.   Lungs: CTAB, no wheezes, rhonchi, or rales noted.  Respiratory effort nonlabored Abd: well healed midline incision, soft, mild distension, few BS heard, no masses, hernias, or organomegaly. Moderate focal tenderness left hemiabdomen MS: calves soft and nontender, no edema Skin: warm and dry with no masses, lesions, or rashes Psych: A&Ox3 with an appropriate affect. Neuro: cranial nerves grossly intact, extremity CSM intact bilaterally, normal speech  Results for orders placed or performed during the hospital encounter of 10/22/18 (from the past 48 hour(s))  CBC with Differential     Status: Abnormal   Collection Time: 10/22/18  8:15 AM  Result Value Ref  Range   WBC 13.7 (H) 4.0 - 10.5 K/uL    Comment: WHITE COUNT CONFIRMED ON SMEAR   RBC 3.46 (L) 3.87 - 5.11 MIL/uL   Hemoglobin 10.0 (L) 12.0 - 15.0 g/dL   HCT 33.9 (L) 36.0 - 46.0 %   MCV 98.0 80.0 - 100.0 fL   MCH 28.9 26.0 - 34.0 pg   MCHC 29.5 (L) 30.0 - 36.0 g/dL   RDW 20.3 (H) 11.5 - 15.5 %   Platelets 323 150 - 400 K/uL   nRBC 0.0 0.0 - 0.2 %   Neutrophils Relative % 86 %   Neutro Abs 12.0 (H) 1.7 - 7.7 K/uL   Lymphocytes Relative 2 %   Lymphs Abs 0.3 (L) 0.7 - 4.0 K/uL   Monocytes Relative 11 %   Monocytes Absolute 1.5 (H) 0.1 - 1.0 K/uL   Eosinophils Relative 0 %   Eosinophils Absolute 0.0 0.0 - 0.5 K/uL   Basophils Relative 0 %   Basophils Absolute 0.1 0.0 - 0.1 K/uL   Immature Granulocytes 1 %   Abs Immature Granulocytes 0.14 (H) 0.00 - 0.07 K/uL    Comment: Performed at [facility], [place] [address], [place], [place] [address]  Comprehensive metabolic panel     Status: Abnormal   Collection Time: 10/22/18  8:15 AM  Result Value Ref Range   Sodium 136 135 - 145 mmol/L   Potassium 3.5 3.5 - 5.1 mmol/L   Chloride 105 98 - 111 mmol/L   CO2 18 (L) 22 - 32 mmol/L   Glucose, Bld 137 (H) 70 - 99 mg/dL   BUN 27 (H) 8 - 23 mg/dL   Creatinine, Ser 3.20 (H) 0.44 - 1.00 mg/dL   Calcium 8.9 8.9 - 10.3 mg/dL   Total Protein 7.7 6.5 - 8.1 g/dL   Albumin 4.0 3.5 - 5.0 g/dL   AST 26 15 - 41 U/L   ALT 11 0 - 44 U/L   Alkaline Phosphatase 60 38 - 126 U/L   Total Bilirubin 0.9 0.3 - 1.2 mg/dL   GFR calc non Af Amer 14 (L) >60 mL/min  GFR calc Af Amer 16 (L) >60 mL/min   Anion gap 13 5 - 15    Comment: Performed at [facility], [place] [address], [place], [place] [address]  Lipase, blood     Status: None   Collection Time: 10/22/18  8:15 AM  Result Value Ref Range   Lipase 36 11 - 51 U/L    Comment: Performed at [facility], [place] [address] [place], [place] [address]  Sample to Blood Bank     Status: None   Collection  Time: 10/22/18  8:15 AM  Result Value Ref Range   Blood Bank Specimen SAMPLE AVAILABLE FOR TESTING    Sample Expiration      10/25/2018 Performed at [facility], [place] [address] [place], [place] [address]   Magnesium     Status: None   Collection Time: 10/22/18  8:15 AM  Result Value Ref Range   Magnesium 1.7 1.7 - 2.4 mg/dL    Comment: Performed at [facility], [place] [address], [place], [place] [address]  I-Stat Troponin, ED (not at [facility])     Status: None   Collection Time: 10/22/18  8:19 AM  Result Value Ref Range   Troponin i, poc 0.08 0.00 - 0.08 ng/mL   Comment NOTIFIED PHYSICIAN    Comment 3            Comment: Due to the release kinetics of cTnI, a negative result within the first hours of the onset of symptoms does not rule out myocardial infarction with certainty. If myocardial infarction is still suspected, repeat the test at appropriate intervals.   I-Stat CG4 Lactic Acid, ED     Status: Abnormal   Collection Time: 10/22/18  8:21 AM  Result Value Ref Range   Lactic Acid, Venous 3.23 (HH) 0.5 - 1.9 mmol/L   Comment NOTIFIED PHYSICIAN   I-Stat Chem 8, ED     Status: Abnormal   Collection Time: 10/22/18  8:21 AM  Result Value Ref Range   Sodium 140 135 - 145 mmol/L   Potassium 3.6 3.5 - 5.1 mmol/L   Chloride 111 98 - 111 mmol/L   BUN 26 (H) 8 - 23 mg/dL   Creatinine, Ser 3.20 (H) 0.44 - 1.00 mg/dL   Glucose, Bld 133 (H) 70 - 99 mg/dL   Calcium, Ion 1.19 1.15 - 1.40 mmol/L   TCO2 20 (L) 22 - 32 mmol/L   Hemoglobin 11.6 (L) 12.0 - 15.0 g/dL   HCT 34.0 (L) 36.0 - 46.0 %  Blood culture (routine x 2)     Status: None (Preliminary result)   Collection Time: 10/22/18  8:21 AM  Result Value Ref Range   Specimen Description      BLOOD RIGHT ARM Performed at [facility] [address] [place], [place] [address]    Special Requests      BOTTLES DRAWN AEROBIC AND ANAEROBIC Blood Culture adequate  volume Performed at [facility] [address], [place], [place] [address]    Culture      NO GROWTH < 12 HOURS Performed at [facility] [address], [place],  [address]    Report Status PENDING   Procalcitonin - Baseline     Status: None   Collection Time: 10/22/18  8:23 AM  Result Value Ref Range   Procalcitonin 1.69 ng/mL    Comment:        Interpretation: PCT > 0.5  ng/mL and <= 2 ng/mL: Systemic infection (sepsis) is possible, but other conditions are known to elevate PCT as well. (NOTE)       Sepsis PCT Algorithm           Lower Respiratory Tract                                      Infection PCT Algorithm    ----------------------------     ----------------------------         PCT < 0.25 ng/mL                PCT < 0.10 ng/mL         Strongly encourage             Strongly discourage   discontinuation of antibiotics    initiation of antibiotics    ----------------------------     -----------------------------       PCT 0.25 - 0.50 ng/mL            PCT 0.10 - 0.25 ng/mL               OR       >80% decrease in PCT            Discourage initiation of                                            antibiotics      Encourage discontinuation           of antibiotics    ----------------------------     -----------------------------         PCT >= 0.50 ng/mL              PCT 0.26 - 0.50 ng/mL                AND       <80% decrease in PCT             Encourage initiation of                                             antibiotics       Encourage continuation           of antibiotics    ----------------------------     -----------------------------        PCT >= 0.50 ng/mL                  PCT > 0.50 ng/mL               AND         increase in PCT                  Strongly encourage                                      initiation of antibiotics    Strongly encourage escalation           of antibiotics                                      -----------------------------  PCT <= 0.25 ng/mL                                                 OR                                        > 80% decrease in PCT                                     Discontinue / Do not initiate                                             antibiotics Performed at [facility] [address], [place], [place] [address]   Blood culture (routine x 2)     Status: None (Preliminary result)   Collection Time: 10/22/18  8:26 AM  Result Value Ref Range   Specimen Description BLOOD LEFT FOOT    Special Requests      BOTTLES DRAWN AEROBIC ONLY Blood Culture results may not be optimal due to an inadequate volume of blood received in culture bottles   Culture      NO GROWTH < 12 HOURS Performed at [facility] [address] [place], [place] [address]    Report Status PENDING   Urinalysis, Routine w reflex microscopic     Status: Abnormal   Collection Time: 10/22/18  9:15 AM  Result Value Ref Range   Color, Urine YELLOW YELLOW   APPearance HAZY (A) CLEAR   Specific Gravity, Urine >1.030 (H) 1.005 - 1.030   pH 5.0 5.0 - 8.0   Glucose, UA NEGATIVE NEGATIVE mg/dL   Hgb urine dipstick NEGATIVE NEGATIVE   Bilirubin Urine MODERATE (A) NEGATIVE   Ketones, ur TRACE (A) NEGATIVE mg/dL   Protein, ur NEGATIVE NEGATIVE mg/dL   Nitrite NEGATIVE NEGATIVE   Leukocytes, UA NEGATIVE NEGATIVE    Comment: Microscopic not done on urines with negative protein, blood, leukocytes, nitrite, or glucose < 500 mg/dL. Performed at [facility], [place] [address] [place], [place] [address]   Procalcitonin - Baseline     Status: None   Collection Time: 10/22/18 12:42 PM  Result Value Ref Range   Procalcitonin 2.27 ng/mL    Comment:        Interpretation: PCT > 2 ng/mL: Systemic infection (sepsis) is likely, unless other causes are known. (NOTE)       Sepsis PCT Algorithm           Lower  Respiratory Tract                                      Infection PCT Algorithm    ----------------------------     ----------------------------         PCT < 0.25 ng/mL                PCT < 0.10 ng/mL         Strongly encourage  Strongly discourage   discontinuation of antibiotics    initiation of antibiotics    ----------------------------     -----------------------------       PCT 0.25 - 0.50 ng/mL            PCT 0.10 - 0.25 ng/mL               OR       >80% decrease in PCT            Discourage initiation of                                            antibiotics      Encourage discontinuation           of antibiotics    ----------------------------     -----------------------------         PCT >= 0.50 ng/mL              PCT 0.26 - 0.50 ng/mL               AND       <80% decrease in PCT              Encourage initiation of                                             antibiotics       Encourage continuation           of antibiotics    ----------------------------     -----------------------------        PCT >= 0.50 ng/mL                  PCT > 0.50 ng/mL               AND         increase in PCT                  Strongly encourage                                      initiation of antibiotics    Strongly encourage escalation           of antibiotics                                     -----------------------------                                           PCT <= 0.25 ng/mL                                                 OR                                        >  80% decrease in PCT                                     Discontinue / Do not initiate                                             antibiotics Performed at [facility] [address] [place], [place] [address]   Lactic acid, plasma     Status: Abnormal   Collection Time: 10/22/18 12:42 PM  Result Value Ref Range   Lactic Acid, Venous 3.7 (HH) 0.5 - 1.9 mmol/L    Comment: CRITICAL  RESULT CALLED TO, READ BACK BY AND VERIFIED WITH: WEST,S. RN AT 3244 10/22/18 MULLINS,T Performed at [facility], [place] [address], [place], [place] [address]    Ct Abdomen Pelvis Wo Contrast  Result Date: 10/22/2018 CLINICAL DATA:  Weakness, abdominal pain EXAM: CT ABDOMEN AND PELVIS WITHOUT CONTRAST TECHNIQUE: Multidetector CT imaging of the abdomen and pelvis was performed following the standard protocol without IV contrast. COMPARISON:  09/24/2018 FINDINGS: Lower chest: Bibasilar atelectasis. Normal heart size. Atherosclerosis noted. No pericardial or pleural effusion. No large hiatal hernia. Hepatobiliary: No focal liver abnormality is seen. No gallstones, gallbladder wall thickening, or biliary dilatation. Pancreas: Unremarkable. No pancreatic ductal dilatation or surrounding inflammatory changes. Spleen: Normal in size.  Accessory splenules noted. Adrenals/Urinary Tract: Normal adrenal glands. No acute renal obstruction, hydronephrosis, obstructing ureteral calculus, or UVJ abnormality. Bilateral renal artery stents noted. Similar hypodense and hyperdense left renal cysts are present, largest measures 2.2 cm laterally. Stomach/Bowel: Stomach and small bowel demonstrate no significant finding, ileus or obstruction pattern. Appendix not visualized with certainty. Wall thickening and surrounding edema noted of the colon extending from the splenic flexure and the proximal first half of the left descending colon compatible with acute left colitis. Mild distention of the transverse colon may represent associated mild ileus related to the colitis. More distal sigmoid and rectum demonstrate diverticulosis without wall thickening. No evidence of perforation, abscess, hemorrhage or hematoma. Small amount of pelvic free fluid likely related to the colitis. Vascular/Lymphatic: Atherosclerosis of the aorta and abdominal vasculature. Negative for aneurysm. No adenopathy. Reproductive: Status post  hysterectomy. No adnexal masses. Other: Intact abdominal wall. Negative for hernia. Trace pelvic ascites. Musculoskeletal: Degenerative changes noted of the spine. Stable retrolisthesis of T12 upon L1 and L1 upon L2 related to degenerative change. Stable myeloma changes at T12. No acute compression fracture or significant interval change. IMPRESSION: Acute left colitis extending from the splenic flexure and involving the proximal first half of the left descending colon which may be related to infectious/inflammatory etiology versus ischemic. No associated definite obstruction pattern, free air, or pneumatosis. Trace pelvic ascites, suspect related to the colitis. No fluid collection or abscess Electronically Signed   By: Jerilynn Mages.  Shick M.D.   On: 10/22/2018 11:32   Dg Chest Port 1 View  Result Date: 10/22/2018 CLINICAL DATA:  Chest pain, abdominal pain, weakness, history multiple myeloma, coronary artery disease post MI, hypertension, arrhythmia is, smoker EXAM: PORTABLE CHEST 1 VIEW COMPARISON:  Portable exam 0803 hours compared to 10/08/2018 FINDINGS: Upper normal heart size. Atherosclerotic calcification aorta. Mediastinal contours and pulmonary vascularity normal. Minimal bibasilar atelectasis. Lungs otherwise clear. No infiltrate, pleural effusion or pneumothorax. Inferior spur formation RIGHT  acromion with adjacent nonspecific soft tissue calcification. IMPRESSION: Bibasilar atelectasis. Electronically Signed   By: Lavonia Dana M.D.   On: 10/22/2018 08:35   Anti-infectives (From admission, onward)   Start     Dose/Rate Route Frequency Ordered Stop   10/22/18 2200  piperacillin-tazobactam (ZOSYN) IVPB 2.25 g     2.25 g 100 mL/hr over 30 Minutes Intravenous Every 8 hours 10/22/18 1241     10/22/18 2200  acyclovir (ZOVIRAX) tablet 400 mg     400 mg Oral 2 times daily 10/22/18 1321     10/22/18 1545  vancomycin (VANCOCIN) 50 mg/mL oral solution 125 mg     125 mg Oral 4 times daily 10/22/18 1334 11/01/18  1359   10/22/18 1239  vancomycin variable dose per unstable renal function (pharmacist dosing)  Status:  Discontinued      Does not apply See admin instructions 10/22/18 1241 10/22/18 1321   10/22/18 0830  vancomycin (VANCOCIN) IVPB 1000 mg/200 mL premix     1,000 mg 200 mL/hr over 60 Minutes Intravenous  Once 10/22/18 0828 10/22/18 1101   10/22/18 0830  ceFEPIme (MAXIPIME) 2 g in sodium chloride 0.9 % 100 mL IVPB     2 g 200 mL/hr over 30 Minutes Intravenous  Once 10/22/18 0828 10/22/18 3888       Assessment/Plan Multiple myeloma - on chemotherapy HTN HLD A fib not on anticoagulation GERD CKD CAD s/p cardiac stent x2 H/o DVT 02/2017 Code status DNR  Hypovolemic vs Septic shock - BP responding to IVF, not currently on pressors AKI - Cr 3.2 Leukocytosis Elevated lactic acid  Left sided colitis, ischemic vs infectious - Patient does not have peritonitis and there are no signs of free air or pneumatosis on CT scan, would not recommend any urgent surgical intervention at this time. Continue broad spectrum antibiotics and resuscitation per CCM. Stool studies pending. Will continue to follow abdominal exam closely. Hopefully this will resolve without surgery. If not she would likely require a colectomy/colostomy. If patient continues over the next couple of days she may benefit from a colonoscopy. Will continue to follow.  ID - zosyn 1/3>>, vancomycin 1/3>>, acyclovir 1/3>>, maxipime 1/3 VTE - SCDs, lovenox FEN - IVF, CLD Foley - in place   Wellington Hampshire, [facility] Surgery 10/22/2018, 3:42 PM Pager: [phone] Mon 7:00 am -11:30 AM Tues-Fri 7:00 am-4:30 pm Sat-Sun 7:00 am-11:30 am

## 2018-10-22 NOTE — Progress Notes (Signed)
A consult was received from an ED physician for  per pharmacy dosing.  The patient's profile has been reviewed for ht/wt/allergies/indication/available labs.    A one time order has been placed for vancomycin 1000 mg IV x1 and cefepime 2gm IV x1.  Further antibiotics/pharmacy consults should be ordered by admitting physician if indicated.                       Thank you, Lynelle Doctor 10/22/2018  8:26 AM

## 2018-10-23 ENCOUNTER — Ambulatory Visit: Payer: Medicare HMO

## 2018-10-23 ENCOUNTER — Inpatient Hospital Stay (HOSPITAL_COMMUNITY): Payer: Medicare HMO

## 2018-10-23 DIAGNOSIS — A419 Sepsis, unspecified organism: Secondary | ICD-10-CM

## 2018-10-23 DIAGNOSIS — I959 Hypotension, unspecified: Secondary | ICD-10-CM

## 2018-10-23 DIAGNOSIS — R6521 Severe sepsis with septic shock: Secondary | ICD-10-CM

## 2018-10-23 DIAGNOSIS — R197 Diarrhea, unspecified: Secondary | ICD-10-CM

## 2018-10-23 DIAGNOSIS — R112 Nausea with vomiting, unspecified: Secondary | ICD-10-CM

## 2018-10-23 LAB — COMPREHENSIVE METABOLIC PANEL
ALT: 11 U/L (ref 0–44)
AST: 31 U/L (ref 15–41)
Albumin: 2.7 g/dL — ABNORMAL LOW (ref 3.5–5.0)
Alkaline Phosphatase: 48 U/L (ref 38–126)
Anion gap: 10 (ref 5–15)
BUN: 27 mg/dL — ABNORMAL HIGH (ref 8–23)
CALCIUM: 7 mg/dL — AB (ref 8.9–10.3)
CO2: 15 mmol/L — ABNORMAL LOW (ref 22–32)
Chloride: 119 mmol/L — ABNORMAL HIGH (ref 98–111)
Creatinine, Ser: 2.42 mg/dL — ABNORMAL HIGH (ref 0.44–1.00)
GFR calc Af Amer: 23 mL/min — ABNORMAL LOW (ref >60)
GFR calc non Af Amer: 19 mL/min — ABNORMAL LOW (ref >60)
Glucose, Bld: 72 mg/dL (ref 70–99)
Potassium: 3.5 mmol/L (ref 3.5–5.1)
Sodium: 144 mmol/L (ref 135–145)
Total Bilirubin: 1.1 mg/dL (ref 0.3–1.2)
Total Protein: 5.7 g/dL — ABNORMAL LOW (ref 6.5–8.1)

## 2018-10-23 LAB — CBC
HEMATOCRIT: 29.8 % — AB (ref 36.0–46.0)
Hemoglobin: 8.7 g/dL — ABNORMAL LOW (ref 12.0–15.0)
MCH: 29.9 pg (ref 26.0–34.0)
MCHC: 29.2 g/dL — ABNORMAL LOW (ref 30.0–36.0)
MCV: 102.4 fL — ABNORMAL HIGH (ref 80.0–100.0)
Platelets: 229 10*3/uL (ref 150–400)
RBC: 2.91 MIL/uL — ABNORMAL LOW (ref 3.87–5.11)
RDW: 20.5 % — ABNORMAL HIGH (ref 11.5–15.5)
WBC: 13.7 10*3/uL — ABNORMAL HIGH (ref 4.0–10.5)
nRBC: 0 % (ref 0.0–0.2)

## 2018-10-23 LAB — PROCALCITONIN: Procalcitonin: 14.05 ng/mL

## 2018-10-23 LAB — PROTIME-INR
INR: 1.37
Prothrombin Time: 16.7 seconds — ABNORMAL HIGH (ref 11.4–15.2)

## 2018-10-23 LAB — MAGNESIUM: Magnesium: 2.3 mg/dL (ref 1.7–2.4)

## 2018-10-23 MED ORDER — SODIUM CHLORIDE 0.9 % IV BOLUS
500.0000 mL | Freq: Once | INTRAVENOUS | Status: AC
  Administered 2018-10-23: 500 mL via INTRAVENOUS

## 2018-10-23 MED ORDER — POTASSIUM CHLORIDE CRYS ER 20 MEQ PO TBCR
40.0000 meq | EXTENDED_RELEASE_TABLET | Freq: Two times a day (BID) | ORAL | Status: AC
  Administered 2018-10-23 (×2): 40 meq via ORAL
  Filled 2018-10-23 (×2): qty 2

## 2018-10-23 MED ORDER — SODIUM CHLORIDE 0.9 % IV SOLN
Freq: Once | INTRAVENOUS | Status: AC
  Administered 2018-10-23: 15:00:00 via INTRAVENOUS

## 2018-10-23 MED ORDER — OXYCODONE HCL 5 MG PO TABS
10.0000 mg | ORAL_TABLET | ORAL | Status: DC | PRN
  Administered 2018-10-23 – 2018-11-01 (×20): 10 mg via ORAL
  Filled 2018-10-23 (×20): qty 2

## 2018-10-23 MED ORDER — SODIUM CHLORIDE 0.9 % IV SOLN
Freq: Once | INTRAVENOUS | Status: AC
  Administered 2018-10-23: 08:00:00 via INTRAVENOUS

## 2018-10-23 MED ORDER — CALCIUM CARBONATE 1250 (500 CA) MG PO TABS
1.0000 | ORAL_TABLET | Freq: Every day | ORAL | Status: DC
  Administered 2018-10-23: 500 mg via ORAL
  Filled 2018-10-23 (×2): qty 1

## 2018-10-23 MED ORDER — MAGNESIUM SULFATE 2 GM/50ML IV SOLN
2.0000 g | Freq: Once | INTRAVENOUS | Status: AC
  Administered 2018-10-23: 2 g via INTRAVENOUS
  Filled 2018-10-23: qty 50

## 2018-10-23 NOTE — Progress Notes (Signed)
Ordered bolus finished. Pt BP 81/34 (51). Notified Dr. Venetia Constable and he wants to monitor BP for now. Will pass this information to night RN to continue to monitor pt and BP. Pt is responsive and oriented at baseline at this time

## 2018-10-23 NOTE — Progress Notes (Signed)
MD notified of low BP (83/43 with MAP of 54) states that notes indicate that patient is receptive to IV fluids and antibiotics but no escalation of care so all we can do at this time is run a fluid bolus. NS bolus started. Will continue to monitor patient at this time.

## 2018-10-23 NOTE — Progress Notes (Signed)
[facility] PROGRESS NOTE    Progress Note  Abigail [name]  ZYS:[id number] DOB: [birth year] DOA: 10/22/2018 PCP: Nolene Ebbs, MD     Brief Narrative:   Abigail [name] is an 73 y.o. female past medical history of multiple myeloma on Cytoxan, chronic kidney disease with a baseline creatinine 1.2, history of DVT A. fib, for which her Eliquis was recently stopped comes in complaining of worsening abdominal pain for 2 weeks. CT of the abdomen was obtained that showed left-sided colitis, critical care initially saw the patient and the patient decline aggressive intervention intubation and pressors Assessment/Plan:   Septic shock due to severe infectious colitis: She was fluid resuscitated started empirically on IV vancomycin and Zosyn. Critical care initially saw the patient and she declined aggressive treatment, CODE STATUS was discussed her sister and the patient desire fluids antibiotic but no further escalation of care. Minimal urine output, we will give her a bolus of normal saline continue aggressive IV fluid hydration. Surgery was consulted recommended conservative management, but if she developed worsening instability peritonitis surgical intervention would be recommended. Continue IV antibiotics bowel rest. Lactic acid is improving slowly.  Acute kidney injury: Likely prerenal azotemia, improving with IV fluid hydration.  Bradycardia: Heart rate in the 50s and 60s, agree with holding diltiazem due to hypotension. We will continue to hold amiodarone,  For bradycardia.  Prolonged QT: Hold amiodarone.  SVT: Brief which resolved with Valsalva maneuver. Keep mag greater than 2 potassium greater than 4. Check a magnesium level.  Multiple myeloma: hold Cytoxan, continue acyclovir.  Anemia of chronic disease: Hemoglobin at baseline continue to monitor.  Permanent atrial fibrillation: Not on anticoagulation, her cardiologist held on admission 10/19/2018 due to nausea and a drop  in hemoglobin.  Follow-up with cardiology as an outpatient to dictate whether to resume anticoagulation.    Awaiting cardiac biomarkers: In the setting of demand ischemia she denies any chest pain or shortness of breath EKG showed no signs of ischemia.  Coronary atherosclerosis of native coronary artery Really asymptomatic, continue Lipitor.  Protein-calorie malnutrition, severe Lactic, will consult nutrition currently she is anorexic.  Goals of care: Speaking to the chair and the patient the patient would like to be a full code. She knows that she said yesterday that she did not want intubation surgery or pressors.  But it seems like she has changed her mind. We will change her to a full code this morning. He has capacity to make medical decisions.   DVT prophylaxis: lovenox Family Communication:none Disposition Plan/Barrier to D/C: unable to determine Code Status:     Code Status Orders  (From admission, onward)         Start     Ordered   10/22/18 1321  Do not attempt resuscitation (DNR)  Continuous    Question Answer Comment  In the event of cardiac or respiratory ARREST Do not call a "code blue"   In the event of cardiac or respiratory ARREST Do not perform Intubation, CPR, defibrillation or ACLS   In the event of cardiac or respiratory ARREST Use medication by any route, position, wound care, and other measures to relive pain and suffering. May use oxygen, suction and manual treatment of airway obstruction as needed for comfort.      10/22/18 1321        Code Status History    Date Active Date Inactive Code Status Order ID Comments User Context   10/22/2018 1227 10/22/2018 1321 DNR [id number]  Garner Nash,  DO ED   10/15/2018 1335 10/19/2018 1448 Full Code [id number]  [name], MD Inpatient   10/08/2018 1009 10/11/2018 1821 Full Code [id number]  Charolette Forward, MD Inpatient   106/29/2019 1639 10/03/2018 1856 Full Code [id number]  Charolette Forward, MD ED   09/15/2018  2244 09/17/2018 2100 Full Code [id number]  [name], MD ED   09/01/2018 1858 09/03/2018 1857 Full Code [id number]  [name], MD ED   12/14/2017 0459 12/17/2017 1558 Full Code [id number]  [name], MD ED   09/03/2017 0716 09/05/2017 1808 Full Code [id number]  Samella Parr, NP ED   08/16/2017 2334 08/21/2017 1905 Full Code [id number]  Karmen Bongo, MD Inpatient   06/23/2017 1213 06/25/2017 1516 Full Code [id number]  [name], MD ED   04/02/2017 0956 04/08/2017 1901 Full Code [id number]  [name], MD ED   03/04/2017 0808 03/12/2017 1728 Full Code [id number]  [name], MD ED   02/24/2017 2334 03/01/2017 1931 Full Code [id number]  Charolette Forward, MD Inpatient   02/24/2017 2333 02/24/2017 2334 Full Code [id number]  Doylene Canning, MD Inpatient   01/21/2017 1738 01/25/2017 1834 Full Code [id number]  [name], MD Inpatient   12/29/2016 1415 01/02/2017 2239 Full Code [id number]  Charolette Forward, MD Inpatient   12/22/2016 1952 12/29/2016 1415 Full Code [id number]  [name], MD Inpatient   12/06/2016 1956 12/09/2016 2008 Full Code [id number]  Rosita Fire, MD Inpatient   11/23/2016 1644 11/27/2016 1819 Full Code [id number]  Elgergawy, Silver Huguenin, MD Inpatient   11/17/2016 1404 11/21/2016 2008 Full Code [id number]  Patrecia Pour, MD Inpatient   06/12/2016 1556 06/19/2016 2203 Full Code [id number]  [name], MD Inpatient   01/11/2016 1608 01/14/2016 1420 Full Code [id number]  [name], MD Inpatient   11/01/2015 1735 11/04/2015 1738 Full Code [id number]  [name], MD Inpatient   12/31/2011 0405 01/02/2012 1640 Full Code [id number]  Freddy Jaksch, RN Inpatient        IV Access:    Peripheral IV   Procedures and diagnostic studies:   Ct Abdomen Pelvis Wo Contrast  Result Date: 10/22/2018 CLINICAL DATA:  Weakness, abdominal pain EXAM: CT ABDOMEN AND PELVIS WITHOUT CONTRAST TECHNIQUE: Multidetector CT imaging of the abdomen and pelvis was performed following the standard protocol  without IV contrast. COMPARISON:  09/24/2018 FINDINGS: Lower chest: Bibasilar atelectasis. Normal heart size. Atherosclerosis noted. No pericardial or pleural effusion. No large hiatal hernia. Hepatobiliary: No focal liver abnormality is seen. No gallstones, gallbladder wall thickening, or biliary dilatation. Pancreas: Unremarkable. No pancreatic ductal dilatation or surrounding inflammatory changes. Spleen: Normal in size.  Accessory splenules noted. Adrenals/Urinary Tract: Normal adrenal glands. No acute renal obstruction, hydronephrosis, obstructing ureteral calculus, or UVJ abnormality. Bilateral renal artery stents noted. Similar hypodense and hyperdense left renal cysts are present, largest measures 2.2 cm laterally. Stomach/Bowel: Stomach and small bowel demonstrate no significant finding, ileus or obstruction pattern. Appendix not visualized with certainty. Wall thickening and surrounding edema noted of the colon extending from the splenic flexure and the proximal first half of the left descending colon compatible with acute left colitis. Mild distention of the transverse colon may represent associated mild ileus related to the colitis. More distal sigmoid and rectum demonstrate diverticulosis without wall thickening. No evidence of perforation, abscess, hemorrhage or hematoma. Small amount of pelvic free fluid likely related to the colitis. Vascular/Lymphatic: Atherosclerosis of the aorta and abdominal vasculature. Negative for aneurysm. No adenopathy. Reproductive: Status post hysterectomy. No adnexal masses. Other: Intact abdominal wall. Negative for  hernia. Trace pelvic ascites. Musculoskeletal: Degenerative changes noted of the spine. Stable retrolisthesis of T12 upon L1 and L1 upon L2 related to degenerative change. Stable myeloma changes at T12. No acute compression fracture or significant interval change. IMPRESSION: Acute left colitis extending from the splenic flexure and involving the proximal  first half of the left descending colon which may be related to infectious/inflammatory etiology versus ischemic. No associated definite obstruction pattern, free air, or pneumatosis. Trace pelvic ascites, suspect related to the colitis. No fluid collection or abscess Electronically Signed   By: Jerilynn Mages.  Shick M.D.   On: 10/22/2018 11:32   Dg Chest Port 1 View  Result Date: 10/23/2018 CLINICAL DATA:  Acute onset of respiratory failure. EXAM: PORTABLE CHEST 1 VIEW COMPARISON:  Chest radiograph performed 10/22/2018 FINDINGS: The lungs are well-aerated. Vascular congestion is noted. Minimal bibasilar atelectasis is seen. There is no evidence of pleural effusion or pneumothorax. The cardiomediastinal silhouette is within normal limits. No acute osseous abnormalities are seen. IMPRESSION: Vascular congestion noted. Minimal bibasilar atelectasis seen. Electronically Signed   By: Garald Balding M.D.   On: 10/23/2018 04:52   Dg Chest Port 1 View  Result Date: 10/22/2018 CLINICAL DATA:  Chest pain, abdominal pain, weakness, history multiple myeloma, coronary artery disease post MI, hypertension, arrhythmia is, smoker EXAM: PORTABLE CHEST 1 VIEW COMPARISON:  Portable exam 0803 hours compared to 10/08/2018 FINDINGS: Upper normal heart size. Atherosclerotic calcification aorta. Mediastinal contours and pulmonary vascularity normal. Minimal bibasilar atelectasis. Lungs otherwise clear. No infiltrate, pleural effusion or pneumothorax. Inferior spur formation RIGHT acromion with adjacent nonspecific soft tissue calcification. IMPRESSION: Bibasilar atelectasis. Electronically Signed   By: Lavonia Dana M.D.   On: 10/22/2018 08:35     Medical Consultants:    None.  Anti-Infectives:   IV vancomycin and Zosyn  Subjective:    Gethsemane P Haber relates she feels slightly better today, anorexic still with abdominal pain.  Objective:    Vitals:   10/23/18 0346 10/23/18 0400 10/23/18 0500 10/23/18 0600  BP:  (!) 119/97 97/61  102/75  Pulse:  (!) 53 (!) 59 89  Resp:  '18 19 19  ' Temp: 98.5 F (36.9 C)     TempSrc: Oral     SpO2:  100% 100% 99%  Weight:      Height:        Intake/Output Summary (Last 24 hours) at 10/23/2018 0710 Last data filed at 10/23/2018 0522 Gross per 24 hour  Intake 3281.56 ml  Output 261 ml  Net 3020.56 ml   Filed Weights   10/22/18 1524  Weight: 69.9 kg    Exam: General exam: In no acute distress. Respiratory system: Good air movement and clear to auscultation Cardiovascular system: Rate and rhythm with positive S1-S2 Gastrointestinal system: Deferred Central nervous system: Alert and oriented. No focal neurological deficits. Extremities: No pedal edema. Skin: No rashes, lesions or ulcers Psychiatry: Judgement and insight appear normal. Mood & affect appropriate.    Data Reviewed:    Labs: Basic Metabolic Panel: Recent Labs  Lab 10/17/18 0648 10/21/18 1055 10/22/18 0815 10/22/18 0821 10/23/18 0335  NA 139 140 136 140 144  K 4.6 3.7 3.5 3.6 3.5  CL 111 110 105 111 119*  CO2 17* 19* 18*  --  15*  GLUCOSE 74 115* 137* 133* 72  BUN 7* 19 27* 26* 27*  CREATININE 1.25* 1.97* 3.20* 3.20* 2.42*  CALCIUM 8.8* 9.1 8.9  --  7.0*  MG  --   --  1.7  --   --  GFR Estimated Creatinine Clearance: 20.9 mL/min (A) (by C-G formula based on SCr of 2.42 mg/dL (H)). Liver Function Tests: Recent Labs  Lab 10/22/18 0815 10/23/18 0335  AST 26 31  ALT 11 11  ALKPHOS 60 48  BILITOT 0.9 1.1  PROT 7.7 5.7*  ALBUMIN 4.0 2.7*   Recent Labs  Lab 10/22/18 0815  LIPASE 36   No results for input(s): AMMONIA in the last 168 hours. Coagulation profile Recent Labs  Lab 10/23/18 0335  INR 1.37    CBC: Recent Labs  Lab 10/17/18 0648 10/21/18 1055 10/22/18 0815 10/22/18 0821 10/23/18 0335  WBC 3.6* 4.2 13.7*  --  13.7*  NEUTROABS  --  2.9 12.0*  --   --   HGB 8.1* 9.1* 10.0* 11.6* 8.7*  HCT 25.5* 30.2* 33.9* 34.0* 29.8*  MCV 93.1 97.7 98.0  --  102.4*  PLT 206 271  323  --  229   Cardiac Enzymes: Recent Labs  Lab 10/22/18 1553 10/22/18 1944  TROPONINI 0.11* 0.18*   BNP (last 3 results) No results for input(s): PROBNP in the last 8760 hours. CBG: Recent Labs  Lab 10/18/18 1207 10/18/18 1704 10/18/18 2209 10/18/18 2339 10/19/18 0734  GLUCAP 80 74 68* 84 76   D-Dimer: No results for input(s): DDIMER in the last 72 hours. Hgb A1c: No results for input(s): HGBA1C in the last 72 hours. Lipid Profile: No results for input(s): CHOL, HDL, LDLCALC, TRIG, CHOLHDL, LDLDIRECT in the last 72 hours. Thyroid function studies: No results for input(s): TSH, T4TOTAL, T3FREE, THYROIDAB in the last 72 hours.  Invalid input(s): FREET3 Anemia work up: No results for input(s): VITAMINB12, FOLATE, FERRITIN, TIBC, IRON, RETICCTPCT in the last 72 hours. Sepsis Labs: Recent Labs  Lab 10/17/18 0648 10/21/18 1055 10/22/18 0815 10/22/18 0821 10/22/18 0823 10/22/18 1242 10/22/18 1553 10/22/18 1944 10/23/18 0335  PROCALCITON  --   --   --   --  1.69 2.27  --   --  14.05  WBC 3.6* 4.2 13.7*  --   --   --   --   --  13.7*  LATICACIDVEN  --   --   --  3.23*  --  3.7* 5.4* 2.1*  --    Microbiology Recent Results (from the past 240 hour(s))  Blood culture (routine x 2)     Status: None (Preliminary result)   Collection Time: 10/22/18  8:21 AM  Result Value Ref Range Status   Specimen Description BLOOD RIGHT ARM  Final   Special Requests   Final    BOTTLES DRAWN AEROBIC AND ANAEROBIC Blood Culture adequate volume Performed at [facility], [place] [address], [place], [place] [address]    Culture NO GROWTH < 24 HOURS  Final   Report Status PENDING  Incomplete  Blood culture (routine x 2)     Status: None (Preliminary result)   Collection Time: 10/22/18  8:26 AM  Result Value Ref Range Status   Specimen Description BLOOD LEFT FOOT  Final   Special Requests   Final    BOTTLES DRAWN AEROBIC ONLY Blood Culture results may not be optimal  due to an inadequate volume of blood received in culture bottles Performed at [facility] [address] [place], [place] [address]    Culture NO GROWTH < 24 HOURS  Final   Report Status PENDING  Incomplete  MRSA PCR Screening     Status: None   Collection Time: 10/22/18  3:18 PM  Result  Value Ref Range Status   MRSA by PCR NEGATIVE NEGATIVE Final    Comment:        The GeneXpert MRSA Assay (FDA approved for NASAL specimens only), is one component of a comprehensive MRSA colonization surveillance program. It is not intended to diagnose MRSA infection nor to guide or monitor treatment for MRSA infections. Performed at [facility], [place] [address], [place], [place] [address]      Medications:   . acyclovir  400 mg Oral BID  . atorvastatin  40 mg Oral q1800  . enoxaparin (LOVENOX) injection  30 mg Subcutaneous Q24H  . ferrous sulfate  325 mg Oral Q breakfast  . pantoprazole  40 mg Oral BID AC  . pyridostigmine  30 mg Oral BID  . vancomycin  125 mg Oral QID   Continuous Infusions: . sodium chloride 100 mL/hr at 10/23/18 0331  . piperacillin-tazobactam (ZOSYN)  IV 2.25 g (10/23/18 0521)     LOS: 1 day   Charlynne Cousins  [facility]   *Please refer to [email or web address], password [email or web address] to get updated schedule on who will round on this patient, as hospitalists switch teams weekly. If 7PM-7AM, please contact night-coverage at [email or web address], password [email or web address] for any overnight needs.  10/23/2018, 7:10 AM

## 2018-10-23 NOTE — Progress Notes (Signed)
hospitalist notified of BP 85/53 with map of 61 and low urine output. No orders received. Will continue to monitor at this time.

## 2018-10-23 NOTE — Progress Notes (Addendum)
Notified Dr. Venetia Constable of BP of 79/40 (53). Another 500cc bolus was ordered. Will continue to monitor and recheck BP

## 2018-10-23 NOTE — Progress Notes (Signed)
Sepsis ([place])  Subjective: Having LLQ pain, somewhat worse than yesterday per pt.  AF without tachycardia or hypotension  Objective: Vital signs in last 24 hours: Temp:  [97.4 F (36.3 C)-99.2 F (37.3 C)] 99.2 F (37.3 C) (01/04 0800) Pulse Rate:  [29-89] 52 (01/04 0800) Resp:  [10-28] 18 (01/04 0800) BP: (66-159)/(22-114) 105/65 (01/04 0800) SpO2:  [91 %-100 %] 96 % (01/04 0800) Weight:  [69.9 kg] 69.9 kg (01/03 1524) Last BM Date: 10/22/18  Intake/Output from previous day: 01/03 0701 - 01/04 0700 In: 3281.6 [I.V.:1211.3; IV Piggyback:2050.3] Out: 261 [Urine:261] Intake/Output this shift: No intake/output data recorded.  General appearance: alert and cooperative GI: normal findings: soft, TTP LLQ  Lab Results:  Results for orders placed or performed during the hospital encounter of 10/22/18 (from the past 24 hour(s))  Urinalysis, Routine w reflex microscopic     Status: Abnormal   Collection Time: 10/22/18  9:15 AM  Result Value Ref Range   Color, Urine YELLOW YELLOW   APPearance HAZY (A) CLEAR   Specific Gravity, Urine >1.030 (H) 1.005 - 1.030   pH 5.0 5.0 - 8.0   Glucose, UA NEGATIVE NEGATIVE mg/dL   Hgb urine dipstick NEGATIVE NEGATIVE   Bilirubin Urine MODERATE (A) NEGATIVE   Ketones, ur TRACE (A) NEGATIVE mg/dL   Protein, ur NEGATIVE NEGATIVE mg/dL   Nitrite NEGATIVE NEGATIVE   Leukocytes, UA NEGATIVE NEGATIVE  Procalcitonin - Baseline     Status: None   Collection Time: 10/22/18 12:42 PM  Result Value Ref Range   Procalcitonin 2.27 ng/mL  Lactic acid, plasma     Status: Abnormal   Collection Time: 10/22/18 12:42 PM  Result Value Ref Range   Lactic Acid, Venous 3.7 (HH) 0.5 - 1.9 mmol/L  MRSA PCR Screening     Status: None   Collection Time: 10/22/18  3:18 PM  Result Value Ref Range   MRSA by PCR NEGATIVE NEGATIVE  Lactic acid, plasma     Status: Abnormal   Collection Time: 10/22/18  3:53 PM  Result Value Ref Range   Lactic Acid, Venous 5.4 (HH) 0.5 -  1.9 mmol/L  Troponin I - Now Then Q6H     Status: Abnormal   Collection Time: 10/22/18  3:53 PM  Result Value Ref Range   Troponin I 0.11 (HH) <0.03 ng/mL  Sodium, urine, random     Status: None   Collection Time: 10/22/18  4:11 PM  Result Value Ref Range   Sodium, Ur 25 mmol/L  Creatinine, urine, random     Status: None   Collection Time: 10/22/18  4:11 PM  Result Value Ref Range   Creatinine, Urine 216.43 mg/dL  Lactic acid, plasma     Status: Abnormal   Collection Time: 10/22/18  7:44 PM  Result Value Ref Range   Lactic Acid, Venous 2.1 (HH) 0.5 - 1.9 mmol/L  Troponin I - Once-Timed     Status: Abnormal   Collection Time: 10/22/18  7:44 PM  Result Value Ref Range   Troponin I 0.18 (HH) <0.03 ng/mL  Procalcitonin     Status: None   Collection Time: 10/23/18  3:35 AM  Result Value Ref Range   Procalcitonin 14.05 ng/mL  CBC     Status: Abnormal   Collection Time: 10/23/18  3:35 AM  Result Value Ref Range   WBC 13.7 (H) 4.0 - 10.5 K/uL   RBC 2.91 (L) 3.87 - 5.11 MIL/uL   Hemoglobin 8.7 (L) 12.0 - 15.0 g/dL   HCT  29.8 (L) 36.0 - 46.0 %   MCV 102.4 (H) 80.0 - 100.0 fL   MCH 29.9 26.0 - 34.0 pg   MCHC 29.2 (L) 30.0 - 36.0 g/dL   RDW 20.5 (H) 11.5 - 15.5 %   Platelets 229 150 - 400 K/uL   nRBC 0.0 0.0 - 0.2 %  Comprehensive metabolic panel     Status: Abnormal   Collection Time: 10/23/18  3:35 AM  Result Value Ref Range   Sodium 144 135 - 145 mmol/L   Potassium 3.5 3.5 - 5.1 mmol/L   Chloride 119 (H) 98 - 111 mmol/L   CO2 15 (L) 22 - 32 mmol/L   Glucose, Bld 72 70 - 99 mg/dL   BUN 27 (H) 8 - 23 mg/dL   Creatinine, Ser 2.42 (H) 0.44 - 1.00 mg/dL   Calcium 7.0 (L) 8.9 - 10.3 mg/dL   Total Protein 5.7 (L) 6.5 - 8.1 g/dL   Albumin 2.7 (L) 3.5 - 5.0 g/dL   AST 31 15 - 41 U/L   ALT 11 0 - 44 U/L   Alkaline Phosphatase 48 38 - 126 U/L   Total Bilirubin 1.1 0.3 - 1.2 mg/dL   GFR calc non Af Amer 19 (L) >60 mL/min   GFR calc Af Amer 23 (L) >60 mL/min   Anion gap 10 5 - 15   Protime-INR     Status: Abnormal   Collection Time: 10/23/18  3:35 AM  Result Value Ref Range   Prothrombin Time 16.7 (H) 11.4 - 15.2 seconds   INR 1.37   Magnesium     Status: None   Collection Time: 10/23/18  7:30 AM  Result Value Ref Range   Magnesium 2.3 1.7 - 2.4 mg/dL     Studies/Results Radiology     MEDS, Scheduled . acyclovir  400 mg Oral BID  . atorvastatin  40 mg Oral q1800  . calcium carbonate  1 tablet Oral Q breakfast  . enoxaparin (LOVENOX) injection  30 mg Subcutaneous Q24H  . ferrous sulfate  325 mg Oral Q breakfast  . pantoprazole  40 mg Oral BID AC  . potassium chloride  40 mEq Oral BID  . pyridostigmine  30 mg Oral BID  . vancomycin  125 mg Oral QID     Assessment: Sepsis ([place]) Appears to be due to colitis in the setting of active chemo treatment.  Wbc and vitals stable.  Plan: No signs of perforation.  Cont BSA.  Limit PO intake.  Will follow.   LOS: 1 day    Rosario Adie, [facility], [place]   10/23/2018 9:01 AM

## 2018-10-24 LAB — BASIC METABOLIC PANEL
Anion gap: 10 (ref 5–15)
BUN: 31 mg/dL — ABNORMAL HIGH (ref 8–23)
CO2: 11 mmol/L — ABNORMAL LOW (ref 22–32)
Calcium: 6.1 mg/dL — CL (ref 8.9–10.3)
Chloride: 125 mmol/L — ABNORMAL HIGH (ref 98–111)
Creatinine, Ser: 1.95 mg/dL — ABNORMAL HIGH (ref 0.44–1.00)
GFR calc non Af Amer: 25 mL/min — ABNORMAL LOW (ref >60)
GFR, EST AFRICAN AMERICAN: 29 mL/min — AB (ref >60)
Glucose, Bld: 63 mg/dL — ABNORMAL LOW (ref 70–99)
Potassium: 4.9 mmol/L (ref 3.5–5.1)
SODIUM: 146 mmol/L — AB (ref 135–145)

## 2018-10-24 LAB — CBC WITH DIFFERENTIAL/PLATELET
ABS IMMATURE GRANULOCYTES: 3.02 10*3/uL — AB (ref 0.00–0.07)
Basophils Absolute: 0.1 10*3/uL (ref 0.0–0.1)
Basophils Relative: 0 %
Eosinophils Absolute: 0.1 10*3/uL (ref 0.0–0.5)
Eosinophils Relative: 1 %
HCT: 26.5 % — ABNORMAL LOW (ref 36.0–46.0)
Hemoglobin: 8.3 g/dL — ABNORMAL LOW (ref 12.0–15.0)
Immature Granulocytes: 20 %
Lymphocytes Relative: 4 %
Lymphs Abs: 0.6 10*3/uL — ABNORMAL LOW (ref 0.7–4.0)
MCH: 30 pg (ref 26.0–34.0)
MCHC: 31.3 g/dL (ref 30.0–36.0)
MCV: 95.7 fL (ref 80.0–100.0)
Monocytes Absolute: 1.5 10*3/uL — ABNORMAL HIGH (ref 0.1–1.0)
Monocytes Relative: 10 %
NRBC: 0.5 % — AB (ref 0.0–0.2)
Neutro Abs: 9.5 10*3/uL — ABNORMAL HIGH (ref 1.7–7.7)
Neutrophils Relative %: 65 %
Platelets: 187 10*3/uL (ref 150–400)
RBC: 2.77 MIL/uL — AB (ref 3.87–5.11)
RDW: 20.8 % — ABNORMAL HIGH (ref 11.5–15.5)
WBC: 14.8 10*3/uL — ABNORMAL HIGH (ref 4.0–10.5)

## 2018-10-24 LAB — GLUCOSE, CAPILLARY
Glucose-Capillary: 65 mg/dL — ABNORMAL LOW (ref 70–99)
Glucose-Capillary: 74 mg/dL (ref 70–99)

## 2018-10-24 LAB — PROCALCITONIN: Procalcitonin: 8.54 ng/mL

## 2018-10-24 MED ORDER — CALCIUM GLUCONATE 500 MG PO TABS: 2.0000 | ORAL_TABLET | Freq: Three times a day (TID) | ORAL | Status: DC

## 2018-10-24 MED ORDER — CALCIUM CARBONATE 1250 (500 CA) MG PO TABS
1000.0000 mg | ORAL_TABLET | Freq: Three times a day (TID) | ORAL | Status: AC
  Administered 2018-10-24 – 2018-10-26 (×6): 1000 mg via ORAL
  Filled 2018-10-24 (×7): qty 1

## 2018-10-24 MED ORDER — CALCIUM CARBONATE 1250 (500 CA) MG PO TABS
1.0000 | ORAL_TABLET | Freq: Every day | ORAL | Status: DC
  Administered 2018-10-27 – 2018-11-02 (×7): 500 mg via ORAL
  Filled 2018-10-24 (×7): qty 1

## 2018-10-24 MED ORDER — AMIODARONE HCL 100 MG PO TABS
100.0000 mg | ORAL_TABLET | Freq: Every day | ORAL | Status: DC
  Administered 2018-10-24 – 2018-11-02 (×10): 100 mg via ORAL
  Filled 2018-10-24 (×10): qty 1

## 2018-10-24 MED ORDER — DEXTROSE 50 % IV SOLN
INTRAVENOUS | Status: AC
  Administered 2018-10-24: 25 mL
  Filled 2018-10-24: qty 50

## 2018-10-24 MED ORDER — FUROSEMIDE 10 MG/ML IJ SOLN
40.0000 mg | Freq: Once | INTRAMUSCULAR | Status: AC
  Administered 2018-10-24: 40 mg via INTRAVENOUS
  Filled 2018-10-24: qty 4

## 2018-10-24 MED ORDER — FLUCONAZOLE 100MG IVPB
100.0000 mg | INTRAVENOUS | Status: DC
  Administered 2018-10-24 – 2018-10-25 (×2): 100 mg via INTRAVENOUS
  Filled 2018-10-24 (×3): qty 50

## 2018-10-24 MED ORDER — ATROPINE SULFATE 1 MG/10ML IJ SOSY
PREFILLED_SYRINGE | INTRAMUSCULAR | Status: AC
  Filled 2018-10-24: qty 10

## 2018-10-24 NOTE — Progress Notes (Signed)
Pharmacy Antibiotic Note  Abigail [name] is a 73 y.o. female admitted on 10/22/2018 with colitis.  Pharmacy has been consulted for vancomycin and Zosyn dosing.  PMH includes multiple myeloma brought to the ED by EMS 10/22/2017 with complaints of weakness and abdominal pain. She had been hospitalized 12/27 with dehydration, anemia and atrial fibrillation with discharge 12/31.   Patient was seen at [facility] yesterday, 10/21/2018.Her chemotherapy treatment was held due to poor performance status and elevated creatinine. Patient reports x1 episode of diarrhea and multiple episodes of vomiting over the last 12 hours.   Plan:  Renal function improving  Increase Zosyn to 2.25gm q6  Vancomycin po discontinued   Height: '5\' 5"'  (165.1 cm) Weight: 154 lb 1.6 oz (69.9 kg) IBW/kg (Calculated) : 57  Temp (24hrs), Avg:98.4 F (36.9 C), Min:97.5 F (36.4 C), Max:99.4 F (37.4 C)  Recent Labs  Lab 10/21/18 1055 10/22/18 0815 10/22/18 0821 10/22/18 1242 10/22/18 1553 10/22/18 1944 10/23/18 0335 10/24/18 0725 10/24/18 0841  WBC 4.2 13.7*  --   --   --   --  13.7*  --  14.8*  CREATININE 1.97* 3.20* 3.20*  --   --   --  2.42* 1.95*  --   LATICACIDVEN  --   --  3.23* 3.7* 5.4* 2.1*  --   --   --     Estimated Creatinine Clearance: 26 mL/min (A) (by C-G formula based on SCr of 1.95 mg/dL (H)).    Allergies  Allergen Reactions  . Sulfa Antibiotics Rash    Antimicrobials this admission: Vancomycin & Cefepime x1 in ED 1/3   PO Vancomycin 1/3  >> 1/5 Zosyn 1/3 >>  Dose adjustments this admission: 1/5 Zosyn 2.25gm q8 >> q6hr  Microbiology results: 1/3 BCx: ngtd 1/3 MRSA PCR: neg GI panel ordered, not collected C.diff PCR never ordered  Thank you for allowing pharmacy to be a part of this patient's care.  Minda Ditto PharmD Pager [phone] 10/24/2018, 10:16 AM

## 2018-10-24 NOTE — Progress Notes (Signed)
Initial Nutrition Assessment  DOCUMENTATION CODES:   Non-severe (moderate) malnutrition in context of chronic illness  INTERVENTION:   Once diet is advanced: Provide Ensure Enlive po TID, each supplement provides 350 kcal and 20 grams of protein  NUTRITION DIAGNOSIS:   Moderate Malnutrition related to chronic illness(multiple myeloma) as evidenced by percent weight loss, energy intake < or equal to 75% for > or equal to 1 month, moderate fat depletion, moderate muscle depletion.  GOAL:   Patient will meet greater than or equal to 90% of their needs  MONITOR:   Diet advancement, Weight trends, Labs, I & O's  REASON FOR ASSESSMENT:   Malnutrition Screening Tool   ASSESSMENT:   73 y.o. female past medical history of multiple myeloma on Cytoxan, chronic kidney disease with a baseline creatinine 1.2, history of DVT A. fib, for which her Eliquis was recently stopped comes in complaining of worsening abdominal pain for 2 weeks.  [facility] Pt was assessed by nutrition team during that admission and reported poor appetite ever since heart cath which was ~3-4 weeks ago. Pt was ordered Ensure supplements at that time.   Pt currently NPO for possible surgery if she worsens. Pt with ileus. Intakes have continued to be poor given abdominal pain. Once diet is able to be advanced, will order Ensure supplements.   Per weight history, pt has lost 30 lb since 10/3 (16% wt loss x 3 months, significant for time frame).   Medications: OSCAL TID, Ferrous sulfate tablet daily Labs reviewed:  Elevated Na GFR: 29  NUTRITION - FOCUSED PHYSICAL EXAM:    Most Recent Value  Orbital Region  Mild depletion  Upper Arm Region  Moderate depletion  Thoracic and Lumbar Region  Unable to assess  Buccal Region  No depletion  Temple Region  Moderate depletion  Clavicle Bone Region  Mild depletion  Clavicle and Acromion Bone Region  Mild depletion  Scapular Bone Region   Unable to assess  Dorsal Hand  Mild depletion  Patellar Region  Mild depletion  Anterior Thigh Region  Mild depletion  Posterior Calf Region  Mild depletion  Edema (RD Assessment)  None       Diet Order:   Diet Order            Diet NPO time specified Except for: Ice Chips  Diet effective now              EDUCATION NEEDS:   Not appropriate for education at this time  Skin:  Skin Assessment: Reviewed RN Assessment  Last BM:  1/3  Height:   Ht Readings from Last 1 Encounters:  10/22/18 '5\' 5"'  (1.651 m)    Weight:   Wt Readings from Last 1 Encounters:  10/22/18 69.9 kg    Ideal Body Weight:  56.8 kg  BMI:  Body mass index is 25.64 kg/m.  Estimated Nutritional Needs:   Kcal:  1700-1900  Protein:  85-95g  Fluid:  1.5L/day  Clayton Bibles, MS, RD, LDN [place] Dietitian Pager: [phone] After Hours Pager: [phone]

## 2018-10-24 NOTE — Progress Notes (Signed)
[facility] PROGRESS NOTE    Progress Note  Abigail [name]  GUY:[id number] DOB: 1946/05/19 DOA: 10/22/2018 PCP: Nolene Ebbs, MD     Brief Narrative:   Abigail [name] is an 73 y.o. female past medical history of multiple myeloma on Cytoxan, chronic kidney disease with a baseline creatinine 1.2, history of DVT A. fib, for which her Eliquis was recently stopped comes in complaining of worsening abdominal pain for 2 weeks. CT of the abdomen was obtained that showed left-sided colitis, critical care initially saw the patient and the patient decline aggressive intervention intubation and pressors Assessment/Plan:   Septic shock due to severe infectious colitis: She was fluid resuscitated started empirically on IV vancomycin and Zosyn. She is about 6 L positive, discontinue IV vancomycin. Urine output has picked up. Continue IV antibiotics bowel rest. Allow clear liquid diet. We will IV fluids she is fluid overloaded on physical exam, will give a single dose of IV Lasix check a basic metabolic panel in the morning replete electrolytes as needed. Continue strict I's and O's.  Acute kidney injury: Creatinine improved likely prerenal. Basic metabolic panel is pending this morning.  Bradycardia: Rate is improved continue to hold diltiazem as blood pressure borderline low. Restart amiodarone.  Prolonged QT: Resolved with repletion of electrolytes. Resume amiodarone  SVT: Keep mag greater than 2 potassium greater than 4.  Multiple myeloma: hold Cytoxan, continue acyclovir.  Anemia of chronic disease: Hemoglobin at baseline continue to monitor.  Permanent atrial fibrillation: Not on anticoagulation, her cardiologist held on admission 10/19/2018 due to nausea and a drop in hemoglobin.  Follow-up with cardiology as an outpatient to dictate whether to resume anticoagulation. Heart rate is controlled.    Elevated cardiac biomarkers: In the setting of demand ischemia she denies any  chest pain or shortness of breath EKG showed no signs of ischemia.  Coronary atherosclerosis of native coronary artery Really asymptomatic, continue Lipitor.  Protein-calorie malnutrition, severe Lactic, will consult nutrition currently she is anorexic.  Oral thrush: Start on IV Diflucan.  Goals of care: Patient would like to be full code.   DVT prophylaxis: lovenox Family Communication:none Disposition Plan/Barrier to D/C: unable to determine Code Status:     Code Status Orders  (From admission, onward)         Start     Ordered   10/22/18 1321  Do not attempt resuscitation (DNR)  Continuous    Question Answer Comment  In the event of cardiac or respiratory ARREST Do not call a "code blue"   In the event of cardiac or respiratory ARREST Do not perform Intubation, CPR, defibrillation or ACLS   In the event of cardiac or respiratory ARREST Use medication by any route, position, wound care, and other measures to relive pain and suffering. May use oxygen, suction and manual treatment of airway obstruction as needed for comfort.      10/22/18 1321        Code Status History    Date Active Date Inactive Code Status Order ID Comments User Context   10/22/2018 1227 10/22/2018 1321 DNR [id number]  Garner Nash, DO ED   10/15/2018 1335 10/19/2018 1448 Full Code [id number]  [name], MD Inpatient   10/08/2018 1009 10/11/2018 1821 Full Code [id number]  Charolette Forward, MD Inpatient   12020-03-1518 1639 10/03/2018 1856 Full Code [id number]  Charolette Forward, MD ED   09/15/2018 2244 09/17/2018 2100 Full Code [id number]  [name], MD ED   09/01/2018 1858 09/03/2018 1857 Full Code  [id number]  [name], MD ED   12/14/2017 0459 12/17/2017 1558 Full Code [id number]  [name], MD ED   09/03/2017 0716 09/05/2017 1808 Full Code [id number]  Samella Parr, NP ED   08/16/2017 2334 08/21/2017 1905 Full Code [id number]  Karmen Bongo, MD Inpatient   06/23/2017 1213 06/25/2017 1516 Full Code  [id number]  [name], MD ED   04/02/2017 0956 04/08/2017 1901 Full Code [id number]  [name], MD ED   03/04/2017 0808 03/12/2017 1728 Full Code [id number]  [name], MD ED   02/24/2017 2334 03/01/2017 1931 Full Code [id number]  Charolette Forward, MD Inpatient   02/24/2017 2333 02/24/2017 2334 Full Code [id number]  Doylene Canning, MD Inpatient   01/21/2017 1738 01/25/2017 1834 Full Code [id number]  [name], MD Inpatient   12/29/2016 1415 01/02/2017 2239 Full Code [id number]  Charolette Forward, MD Inpatient   12/22/2016 1952 12/29/2016 1415 Full Code [id number]  [name], MD Inpatient   12/06/2016 1956 12/09/2016 2008 Full Code [id number]  Rosita Fire, MD Inpatient   11/23/2016 1644 11/27/2016 1819 Full Code [id number]  Elgergawy, Silver Huguenin, MD Inpatient   11/17/2016 1404 11/21/2016 2008 Full Code [id number]  Patrecia Pour, MD Inpatient   06/12/2016 1556 06/19/2016 2203 Full Code [id number]  [name], MD Inpatient   01/11/2016 1608 01/14/2016 1420 Full Code [id number]  [name], MD Inpatient   11/01/2015 1735 11/04/2015 1738 Full Code [id number]  [name], MD Inpatient   12/31/2011 0405 01/02/2012 1640 Full Code [id number]  Freddy Jaksch, RN Inpatient        IV Access:    Peripheral IV   Procedures and diagnostic studies:   Ct Abdomen Pelvis Wo Contrast  Result Date: 10/22/2018 CLINICAL DATA:  Weakness, abdominal pain EXAM: CT ABDOMEN AND PELVIS WITHOUT CONTRAST TECHNIQUE: Multidetector CT imaging of the abdomen and pelvis was performed following the standard protocol without IV contrast. COMPARISON:  09/24/2018 FINDINGS: Lower chest: Bibasilar atelectasis. Normal heart size. Atherosclerosis noted. No pericardial or pleural effusion. No large hiatal hernia. Hepatobiliary: No focal liver abnormality is seen. No gallstones, gallbladder wall thickening, or biliary dilatation. Pancreas: Unremarkable. No pancreatic ductal dilatation or surrounding inflammatory changes. Spleen: Normal in  size.  Accessory splenules noted. Adrenals/Urinary Tract: Normal adrenal glands. No acute renal obstruction, hydronephrosis, obstructing ureteral calculus, or UVJ abnormality. Bilateral renal artery stents noted. Similar hypodense and hyperdense left renal cysts are present, largest measures 2.2 cm laterally. Stomach/Bowel: Stomach and small bowel demonstrate no significant finding, ileus or obstruction pattern. Appendix not visualized with certainty. Wall thickening and surrounding edema noted of the colon extending from the splenic flexure and the proximal first half of the left descending colon compatible with acute left colitis. Mild distention of the transverse colon may represent associated mild ileus related to the colitis. More distal sigmoid and rectum demonstrate diverticulosis without wall thickening. No evidence of perforation, abscess, hemorrhage or hematoma. Small amount of pelvic free fluid likely related to the colitis. Vascular/Lymphatic: Atherosclerosis of the aorta and abdominal vasculature. Negative for aneurysm. No adenopathy. Reproductive: Status post hysterectomy. No adnexal masses. Other: Intact abdominal wall. Negative for hernia. Trace pelvic ascites. Musculoskeletal: Degenerative changes noted of the spine. Stable retrolisthesis of T12 upon L1 and L1 upon L2 related to degenerative change. Stable myeloma changes at T12. No acute compression fracture or significant interval change. IMPRESSION: Acute left colitis extending from the splenic flexure and involving the proximal first half of the left descending colon which may be related to infectious/inflammatory etiology versus  ischemic. No associated definite obstruction pattern, free air, or pneumatosis. Trace pelvic ascites, suspect related to the colitis. No fluid collection or abscess Electronically Signed   By: Jerilynn Mages.  Shick M.D.   On: 10/22/2018 11:32   Dg Chest Port 1 View  Result Date: 10/23/2018 CLINICAL DATA:  Acute onset of  respiratory failure. EXAM: PORTABLE CHEST 1 VIEW COMPARISON:  Chest radiograph performed 10/22/2018 FINDINGS: The lungs are well-aerated. Vascular congestion is noted. Minimal bibasilar atelectasis is seen. There is no evidence of pleural effusion or pneumothorax. The cardiomediastinal silhouette is within normal limits. No acute osseous abnormalities are seen. IMPRESSION: Vascular congestion noted. Minimal bibasilar atelectasis seen. Electronically Signed   By: Garald Balding M.D.   On: 10/23/2018 04:52   Dg Chest Port 1 View  Result Date: 10/22/2018 CLINICAL DATA:  Chest pain, abdominal pain, weakness, history multiple myeloma, coronary artery disease post MI, hypertension, arrhythmia is, smoker EXAM: PORTABLE CHEST 1 VIEW COMPARISON:  Portable exam 0803 hours compared to 10/08/2018 FINDINGS: Upper normal heart size. Atherosclerotic calcification aorta. Mediastinal contours and pulmonary vascularity normal. Minimal bibasilar atelectasis. Lungs otherwise clear. No infiltrate, pleural effusion or pneumothorax. Inferior spur formation RIGHT acromion with adjacent nonspecific soft tissue calcification. IMPRESSION: Bibasilar atelectasis. Electronically Signed   By: Lavonia Dana M.D.   On: 10/22/2018 08:35     Medical Consultants:    None.  Anti-Infectives:   IV vancomycin and Zosyn  Subjective:    Taron P Edell relates she feels slightly better today, she is still anorexic and continues to have abdominal pain.  Objective:    Vitals:   10/24/18 0400 10/24/18 0500 10/24/18 0600 10/24/18 0700  BP: 98/66 97/63 107/81 (!) 119/58  Pulse: (!) 51 (!) 52 (!) 50 62  Resp: (!) '21 18 17 ' (!) 22  Temp:      TempSrc:      SpO2: 100% 100% 100% 100%  Weight:      Height:        Intake/Output Summary (Last 24 hours) at 10/24/2018 0727 Last data filed at 10/24/2018 0513 Gross per 24 hour  Intake 3821.79 ml  Output 655 ml  Net 3166.79 ml   Filed Weights   10/22/18 1524  Weight: 69.9 kg     Exam: General exam: In no acute distress. Respiratory system: Good air movement and clear to auscultation Cardiovascular system: Rate and rhythm with positive S1-S2 Gastrointestinal system: Deferred Central nervous system: Alert and oriented. No focal neurological deficits. Extremities: No pedal edema. Skin: No rashes, lesions or ulcers Psychiatry: Judgement and insight appear normal. Mood & affect appropriate.    Data Reviewed:    Labs: Basic Metabolic Panel: Recent Labs  Lab 10/21/18 1055 10/22/18 0815 10/22/18 0821 10/23/18 0335 10/23/18 0730  NA 140 136 140 144  --   K 3.7 3.5 3.6 3.5  --   CL 110 105 111 119*  --   CO2 19* 18*  --  15*  --   GLUCOSE 115* 137* 133* 72  --   BUN 19 27* 26* 27*  --   CREATININE 1.97* 3.20* 3.20* 2.42*  --   CALCIUM 9.1 8.9  --  7.0*  --   MG  --  1.7  --   --  2.3   GFR Estimated Creatinine Clearance: 20.9 mL/min (A) (by C-G formula based on SCr of 2.42 mg/dL (H)). Liver Function Tests: Recent Labs  Lab 10/22/18 0815 10/23/18 0335  AST 26 31  ALT 11 11  ALKPHOS 60 48  BILITOT 0.9 1.1  PROT 7.7 5.7*  ALBUMIN 4.0 2.7*   Recent Labs  Lab 10/22/18 0815  LIPASE 36   No results for input(s): AMMONIA in the last 168 hours. Coagulation profile Recent Labs  Lab 10/23/18 0335  INR 1.37    CBC: Recent Labs  Lab 10/21/18 1055 10/22/18 0815 10/22/18 0821 10/23/18 0335  WBC 4.2 13.7*  --  13.7*  NEUTROABS 2.9 12.0*  --   --   HGB 9.1* 10.0* 11.6* 8.7*  HCT 30.2* 33.9* 34.0* 29.8*  MCV 97.7 98.0  --  102.4*  PLT 271 323  --  229   Cardiac Enzymes: Recent Labs  Lab 10/22/18 1553 10/22/18 1944  TROPONINI 0.11* 0.18*   BNP (last 3 results) No results for input(s): PROBNP in the last 8760 hours. CBG: Recent Labs  Lab 10/18/18 1207 10/18/18 1704 10/18/18 2209 10/18/18 2339 10/19/18 0734  GLUCAP 80 74 68* 84 76   D-Dimer: No results for input(s): DDIMER in the last 72 hours. Hgb A1c: No results for  input(s): HGBA1C in the last 72 hours. Lipid Profile: No results for input(s): CHOL, HDL, LDLCALC, TRIG, CHOLHDL, LDLDIRECT in the last 72 hours. Thyroid function studies: No results for input(s): TSH, T4TOTAL, T3FREE, THYROIDAB in the last 72 hours.  Invalid input(s): FREET3 Anemia work up: No results for input(s): VITAMINB12, FOLATE, FERRITIN, TIBC, IRON, RETICCTPCT in the last 72 hours. Sepsis Labs: Recent Labs  Lab 10/21/18 1055 10/22/18 0815 10/22/18 0821 10/22/18 0823 10/22/18 1242 10/22/18 1553 10/22/18 1944 10/23/18 0335  PROCALCITON  --   --   --  1.69 2.27  --   --  14.05  WBC 4.2 13.7*  --   --   --   --   --  13.7*  LATICACIDVEN  --   --  3.23*  --  3.7* 5.4* 2.1*  --    Microbiology Recent Results (from the past 240 hour(s))  Blood culture (routine x 2)     Status: None (Preliminary result)   Collection Time: 10/22/18  8:21 AM  Result Value Ref Range Status   Specimen Description BLOOD RIGHT ARM  Final   Special Requests   Final    BOTTLES DRAWN AEROBIC AND ANAEROBIC Blood Culture adequate volume Performed at [facility], [place] [address], [place], [place] [address]    Culture NO GROWTH 2 DAYS  Final   Report Status PENDING  Incomplete  Blood culture (routine x 2)     Status: None (Preliminary result)   Collection Time: 10/22/18  8:26 AM  Result Value Ref Range Status   Specimen Description BLOOD LEFT FOOT  Final   Special Requests   Final    BOTTLES DRAWN AEROBIC ONLY Blood Culture results may not be optimal due to an inadequate volume of blood received in culture bottles Performed at [facility] [address] [place], [place] [address]    Culture NO GROWTH 2 DAYS  Final   Report Status PENDING  Incomplete  MRSA PCR Screening     Status: None   Collection Time: 10/22/18  3:18 PM  Result Value Ref Range Status   MRSA by PCR NEGATIVE NEGATIVE Final    Comment:        The GeneXpert MRSA Assay (FDA approved for NASAL  specimens only), is one component of a comprehensive MRSA colonization surveillance program. It is not intended to diagnose MRSA infection nor to guide or monitor treatment for MRSA infections. Performed at  [facility], [place] [address] [place], [place] [address]      Medications:   . acyclovir  400 mg Oral BID  . atorvastatin  40 mg Oral q1800  . atropine      . calcium carbonate  1 tablet Oral Q breakfast  . enoxaparin (LOVENOX) injection  30 mg Subcutaneous Q24H  . ferrous sulfate  325 mg Oral Q breakfast  . pantoprazole  40 mg Oral BID AC  . pyridostigmine  30 mg Oral BID  . vancomycin  125 mg Oral QID   Continuous Infusions: . sodium chloride 100 mL/hr at 10/24/18 0500  . piperacillin-tazobactam (ZOSYN)  IV Stopped (10/24/18 0543)     LOS: 2 days   Charlynne Cousins  [facility]   *Please refer to [email or web address], password [email or web address] to get updated schedule on who will round on this patient, as hospitalists switch teams weekly. If 7PM-7AM, please contact night-coverage at [email or web address], password [email or web address] for any overnight needs.  10/24/2018, 7:27 AM

## 2018-10-24 NOTE — Progress Notes (Signed)
CRITICAL VALUE ALERT  Critical Value:  Calcium 6.1  Date & Time Notied:  10/24/2017 0815  Provider Notified: Dr. Aileen Fass  Orders Received/Actions taken: Waiting for further orders

## 2018-10-24 NOTE — Progress Notes (Addendum)
Sepsis ([place])  Subjective: Having LLQ pain.  AF without tachycardia or severe hypotension, uop improving  Objective: Vital signs in last 24 hours: Temp:  [98 F (36.7 C)-99.4 F (37.4 C)] 98.9 F (37.2 C) (01/05 0000) Pulse Rate:  [31-62] 62 (01/05 0700) Resp:  [11-22] 22 (01/05 0700) BP: (74-122)/(39-87) 119/58 (01/05 0700) SpO2:  [82 %-100 %] 100 % (01/05 0700) Last BM Date: 10/22/18  Intake/Output from previous day: 01/04 0701 - 01/05 0700 In: 3821.8 [P.O.:120; I.V.:2984.1; IV Piggyback:717.7] Out: 655 [Urine:655] Intake/Output this shift: Total I/O In: 286.1 [I.V.:237.1; IV Piggyback:48.9] Out: -   General appearance: alert and cooperative GI: normal findings: soft, TTP LLQ  Lab Results:  No results found for this or any previous visit (from the past 24 hour(s)).   Studies/Results Radiology     MEDS, Scheduled . acyclovir  400 mg Oral BID  . amiodarone  100 mg Oral Daily  . atorvastatin  40 mg Oral q1800  . calcium carbonate  1 tablet Oral Q breakfast  . enoxaparin (LOVENOX) injection  30 mg Subcutaneous Q24H  . ferrous sulfate  325 mg Oral Q breakfast  . pantoprazole  40 mg Oral BID AC  . pyridostigmine  30 mg Oral BID     Assessment: Sepsis ([place]) Appears to be due to colitis in the setting of active chemo treatment.  Vitals stable.  UOP improving.    Plan: No signs of perforation.  Will recheck labs.  Cont BSA.  Limit PO intake.  Rec IV pain meds as PO most likely not effective in setting of ileus.  Will follow.   Labs reviewed WBC increased.  UOP better and Cr improving suggesting she is not systemically worsening.  Given wbc increase, will make NPO in case she deteriorates and needs urgent surgery.  For right now we will follow closely    LOS: 2 days    Rosario Adie, MD [facility], [place]   10/24/2018 7:40 AM

## 2018-10-25 LAB — BASIC METABOLIC PANEL
Anion gap: 11 (ref 5–15)
BUN: 28 mg/dL — ABNORMAL HIGH (ref 8–23)
CO2: 10 mmol/L — ABNORMAL LOW (ref 22–32)
CREATININE: 2 mg/dL — AB (ref 0.44–1.00)
Calcium: 6.7 mg/dL — ABNORMAL LOW (ref 8.9–10.3)
Chloride: 128 mmol/L — ABNORMAL HIGH (ref 98–111)
GFR calc non Af Amer: 24 mL/min — ABNORMAL LOW (ref >60)
GFR, EST AFRICAN AMERICAN: 28 mL/min — AB (ref >60)
Glucose, Bld: 70 mg/dL (ref 70–99)
Potassium: 4.6 mmol/L (ref 3.5–5.1)
Sodium: 149 mmol/L — ABNORMAL HIGH (ref 135–145)

## 2018-10-25 LAB — CBC
HCT: 24.6 % — ABNORMAL LOW (ref 36.0–46.0)
Hemoglobin: 7.7 g/dL — ABNORMAL LOW (ref 12.0–15.0)
MCH: 30 pg (ref 26.0–34.0)
MCHC: 31.3 g/dL (ref 30.0–36.0)
MCV: 95.7 fL (ref 80.0–100.0)
Platelets: 185 10*3/uL (ref 150–400)
RBC: 2.57 MIL/uL — ABNORMAL LOW (ref 3.87–5.11)
RDW: 20.5 % — ABNORMAL HIGH (ref 11.5–15.5)
WBC: 21.6 10*3/uL — ABNORMAL HIGH (ref 4.0–10.5)
nRBC: 0.4 % — ABNORMAL HIGH (ref 0.0–0.2)

## 2018-10-25 MED ORDER — PIPERACILLIN-TAZOBACTAM 3.375 G IVPB
3.3750 g | Freq: Three times a day (TID) | INTRAVENOUS | Status: DC
  Administered 2018-10-25 – 2018-10-30 (×15): 3.375 g via INTRAVENOUS
  Filled 2018-10-25 (×17): qty 50

## 2018-10-25 MED ORDER — ENSURE ENLIVE PO LIQD
237.0000 mL | Freq: Two times a day (BID) | ORAL | Status: DC
  Administered 2018-10-26: 237 mL via ORAL

## 2018-10-25 MED ORDER — SODIUM BICARBONATE 650 MG PO TABS
1300.0000 mg | ORAL_TABLET | Freq: Three times a day (TID) | ORAL | Status: AC
  Administered 2018-10-25 (×3): 1300 mg via ORAL
  Filled 2018-10-25 (×3): qty 2

## 2018-10-25 NOTE — Evaluation (Signed)
Physical Therapy Evaluation Patient Details Name: Abigail [name] MRN: [id number] DOB: 1946/02/20 Today's Date: 10/25/2018   History of Present Illness  73 y.o. female past medical history of multiple myeloma on Cytoxan, chronic kidney disease admitted with sepsis d/t severe colitis  Clinical Impression  Pt admitted with above diagnosis. Pt currently with functional limitations due to the deficits listed below (see PT Problem List). Pt with severe deconditioning, weakness, limited home support and  multiple admissions in last 6 mos; recommend SNF;  Pt will benefit from skilled PT to increase their independence and safety with mobility to allow discharge to the venue listed below.     Follow Up Recommendations SNF    Equipment Recommendations  None recommended by PT    Recommendations for Other Services       Precautions / Restrictions Precautions Precautions: Fall Restrictions Weight Bearing Restrictions: No      Mobility  Bed Mobility Overal bed mobility: Needs Assistance Bed Mobility: Supine to Sit     Supine to sit: Mod assist     General bed mobility comments: Increased time assist needed fro trunk, bed pad used to assist scooting, excessive time required  Transfers Overall transfer level: Needs assistance Equipment used: None Transfers: Sit to/from [facility] Sit to Stand: Mod assist Stand pivot transfers: Mod assist       General transfer comment: assist to rise and stabilize, pt sat before finishing turn, max multi-modal cues  for participation and to position in chair once seated  Ambulation/Gait                Stairs            Wheelchair Mobility    Modified Rankin (Stroke Patients Only)       Balance Overall balance assessment: Needs assistance Sitting-balance support: Feet supported;No upper extremity supported Sitting balance-Leahy Scale: Fair Sitting balance - Comments: difficulty with wt shifting   Standing balance  support: During functional activity Standing balance-Leahy Scale: Poor                               Pertinent Vitals/Pain Pain Assessment: Faces Faces Pain Scale: Hurts even more Pain Location: diffuse, pain with light touch Pain Descriptors / Indicators: Grimacing Pain Intervention(s): Limited activity within patient's tolerance;Monitored during session;Patient requesting pain meds-RN notified;RN gave pain meds during session    Home Living Family/patient expects to be discharged to:: Private residence Living Arrangements: Alone Available Help at Discharge: Family;Friend(s);Available PRN/intermittently Type of Home: Apartment Home Access: Stairs to enter Entrance Stairs-Rails: Left Entrance Stairs-Number of Steps: 3 Home Layout: One level Home Equipment: Walker - 2 wheels;Cane - single point;Grab bars - tub/shower      Prior Function Level of Independence: Independent with assistive device(s)         Comments: Mod indep with intermittent use of RW. Does not drive; friend drives to all appointments/errands.  pt reports staying in bed most of the time at home     Hand Dominance        Extremity/Trunk Assessment   Upper Extremity Assessment Upper Extremity Assessment: Generalized weakness    Lower Extremity Assessment Lower Extremity Assessment: Generalized weakness       Communication      Cognition Arousal/Alertness: Awake/alert Behavior During Therapy: WFL for tasks assessed/performed Overall Cognitive Status: Impaired/Different from baseline Area of Impairment: Following commands  Following Commands: Follows multi-step commands inconsistently;Follows one step commands with increased time       General Comments: difficult to understand at times      General Comments      Exercises     Assessment/Plan    PT Assessment Patient needs continued PT services  PT Problem List Decreased strength;Decreased  mobility;Decreased activity tolerance;Cardiopulmonary status limiting activity;Decreased balance;Decreased knowledge of use of DME       PT Treatment Interventions Functional mobility training;Patient/family education;Balance training;DME instruction;Gait training;Therapeutic activities;Therapeutic exercise;Stair training    PT Goals (Current goals can be found in the Care Plan section)  Acute Rehab PT Goals Patient Stated Goal: to feel better PT Goal Formulation: With patient Time For Goal Achievement: 11/08/18 Potential to Achieve Goals: Fair    Frequency Min 2X/week   Barriers to discharge Decreased caregiver support      Co-evaluation               AM-PAC PT "6 Clicks" Mobility  Outcome Measure Help needed turning from your back to your side while in a flat bed without using bedrails?: A Lot Help needed moving from lying on your back to sitting on the side of a flat bed without using bedrails?: A Lot Help needed moving to and from a bed to a chair (including a wheelchair)?: A Lot Help needed standing up from a chair using your arms (e.g., wheelchair or bedside chair)?: A Lot Help needed to walk in hospital room?: A Lot Help needed climbing 3-5 steps with a railing? : Total 6 Click Score: 11    End of Session Equipment Utilized During Treatment: Gait belt Activity Tolerance: Patient limited by fatigue;Patient limited by pain Patient left: with call bell/phone within reach;with chair alarm set;in chair Nurse Communication: Mobility status PT Visit Diagnosis: Muscle weakness (generalized) (M62.81);Difficulty in walking, not elsewhere classified (R26.2)    Time: 0910-6816 PT Time Calculation (min) (ACUTE ONLY): 34 min   Charges:   PT Evaluation $PT Eval Moderate Complexity: 1 Mod PT Treatments $Therapeutic Activity: 8-22 mins        .Kenyon Ana, PT  Pager: [phone] Acute Rehab Dept [facility]): [phone]   10/25/2018   [facility] 10/25/2018, 12:24  PM

## 2018-10-25 NOTE — Progress Notes (Signed)
IV team unable to attempt again (max 3 attempts) at this time, new consult required for night shift.  Will continue to monitor.

## 2018-10-25 NOTE — Progress Notes (Signed)
Assessment & Plan: HD#4 - colitis in the setting of active chemo treatment  WBC elevated at 21.6 this AM - continue to monitor  Afebrile, VSS  Persistent LLQ tenderness and pain  OK to start liquid diet today and monitor  No signs of clinical deterioration or perforation.  Will continue to follow.        Armandina Gemma, MD       [facility]       Office: [phone]   Chief Complaint: Abdominal pain, colitis  Subjective: Patient in bed, mildly confused.  Feels "better".  Objective: Vital signs in last 24 hours: Temp:  [98.1 F (36.7 C)-99.1 F (37.3 C)] 98.6 F (37 C) (01/06 0800) Pulse Rate:  [54-100] 67 (01/06 0700) Resp:  [12-22] 12 (01/06 0700) BP: (96-163)/(43-112) 134/56 (01/06 0700) SpO2:  [98 %-100 %] 98 % (01/06 0700) Last BM Date: 10/22/17  Intake/Output from previous day: 01/05 0701 - 01/06 0700 In: 479.3 [I.V.:282.8; IV Piggyback:196.5] Out: 2150 [Urine:2150] Intake/Output this shift: No intake/output data recorded.  Physical Exam: HEENT - sclerae clear, mucous membranes moist Neck - soft Chest - clear bilaterally Cor - RRR Abdomen - soft without distension; BS present; tender left mid and lower abdomen; no mass   Lab Results:  Recent Labs    10/24/18 0841 10/25/18 0619  WBC 14.8* 21.6*  HGB 8.3* 7.7*  HCT 26.5* 24.6*  PLT 187 185   BMET Recent Labs    10/24/18 0725 10/25/18 0619  NA 146* 149*  K 4.9 4.6  CL 125* 128*  CO2 11* 10*  GLUCOSE 63* 70  BUN 31* 28*  CREATININE 1.95* 2.00*  CALCIUM 6.1* 6.7*   PT/INR Recent Labs    10/23/18 0335  LABPROT 16.7*  INR 1.37   Comprehensive Metabolic Panel:    Component Value Date/Time   NA 149 (H) 10/25/2018 0619   NA 146 (H) 10/24/2018 0725   NA 144 09/17/2017 1429   NA 141 07/30/2017 1504   K 4.6 10/25/2018 0619   K 4.9 10/24/2018 0725   K 3.1 (L) 09/17/2017 1429   K 4.8 07/30/2017 1504   CL 128 (H) 10/25/2018 0619   CL 125 (H) 10/24/2018 0725   CO2  10 (L) 10/25/2018 0619   CO2 11 (L) 10/24/2018 0725   CO2 23 09/17/2017 1429   CO2 20 (L) 07/30/2017 1504   BUN 28 (H) 10/25/2018 0619   BUN 31 (H) 10/24/2018 0725   BUN 15.6 09/17/2017 1429   BUN 24.4 07/30/2017 1504   CREATININE 2.00 (H) 10/25/2018 0619   CREATININE 1.95 (H) 10/24/2018 0725   CREATININE 1.97 (H) 10/21/2018 1055   CREATININE 1.36 (H) 10/07/2018 0823   CREATININE 1.0 09/17/2017 1429   CREATININE 1.2 (H) 07/30/2017 1504   GLUCOSE 70 10/25/2018 0619   GLUCOSE 63 (L) 10/24/2018 0725   GLUCOSE 96 09/17/2017 1429   GLUCOSE 79 07/30/2017 1504   CALCIUM 6.7 (L) 10/25/2018 0619   CALCIUM 6.1 (LL) 10/24/2018 0725   CALCIUM 8.6 09/17/2017 1429   CALCIUM 9.1 07/30/2017 1504   AST 31 10/23/2018 0335   AST 26 10/22/2018 0815   AST 15 10/07/2018 0823   AST 17 09/30/2018 1005   AST 14 09/17/2017 1429   AST 16 07/30/2017 1504   ALT 11 10/23/2018 0335   ALT 11 10/22/2018 0815   ALT 7 10/07/2018 0823   ALT 6 09/30/2018 1005   ALT 11 09/17/2017 1429   ALT 10 07/30/2017  1504   ALKPHOS 48 10/23/2018 0335   ALKPHOS 60 10/22/2018 0815   ALKPHOS 93 09/17/2017 1429   ALKPHOS 122 07/30/2017 1504   BILITOT 1.1 10/23/2018 0335   BILITOT 0.9 10/22/2018 0815   BILITOT 1.1 10/07/2018 0823   BILITOT 1.0 09/30/2018 1005   BILITOT 1.22 (H) 09/17/2017 1429   BILITOT 0.43 07/30/2017 1504   PROT 5.7 (L) 10/23/2018 0335   PROT 7.7 10/22/2018 0815   PROT 6.6 09/17/2017 1429   PROT 6.1 09/17/2017 1428   PROT 6.1 07/30/2017 1504   PROT 6.6 07/30/2017 1504   ALBUMIN 2.7 (L) 10/23/2018 0335   ALBUMIN 4.0 10/22/2018 0815   ALBUMIN 3.6 09/17/2017 1429   ALBUMIN 4.0 07/30/2017 1504    Studies/Results: No results found.    Abigail [name] M 10/25/2018  Patient ID: Abigail [name], female   DOB: Nov 25, 1945, 73 y.o.   MRN: [id number]

## 2018-10-25 NOTE — Care Management Note (Signed)
Case Management Note  Patient Details  Name: Abigail [name] MRN: [id number] Date of Birth: 09-Jun-1946  Subjective/Objective:                  Return to top of Inflammatory Bowel Disease RRG - [place]  Discharge readiness is indicated by patient meeting Recovery Milestones, including ALL of the following: ? Hemodynamic stability  bp 96/43 to 163/112 ? Surgery not indicated  Surgery is following ? Bloody diarrhea absent or improved sufficiently for next level of care  ? Hgb/Hct stable no hgb 7.7 ? Fever absent  Temp 99.1 ? No evidence of infection requiring inpatient care nmo wbc 21.6 ? No evidence of peritonitis or abscess/ colitis ? Vomiting absent yes ? Renal function at baseline or appropriate for next level of care  Bun=28, creat.'=2.00 ? Electrolytes normal or improved na level =149 ? Ambulatory no ? Oral hydration, medications,[L] and diet ? Iv na at kvo, iv diflucan, iv zosyn. Full liquid diet   Action/Plan: Following for progression of care. Following for cm needs none present at this time.  Expected Discharge Date:  (unknown)               Expected Discharge Plan:     In-House Referral:     Discharge planning Services     Post Acute Care Choice:    Choice offered to:     DME Arranged:    DME Agency:     HH Arranged:    HH Agency:     Status of Service:     If discussed at [employer], dates discussed:    Additional Comments:  Leeroy Cha, RN 10/25/2018, 9:38 AM

## 2018-10-25 NOTE — Progress Notes (Signed)
Pharmacy Antibiotic Note  Abigail [name] is a 73 y.o. female admitted on 10/22/2018 with sepsis d/t severe colitis.  Pharmacy has been consulted for Zosyn dosing.  Plan:  Change to Zosyn 3.375g IV Q8H infused over 4hrs.  F/u ability to de-escalate antibiotics.   Height: 5\' 5"  (165.1 cm) Weight: 154 lb 1.6 oz (69.9 kg) IBW/kg (Calculated) : 57  Temp (24hrs), Avg:98.5 F (36.9 C), Min:98.1 F (36.7 C), Max:99.1 F (37.3 C)  Recent Labs  Lab 10/21/18 1055 10/22/18 0815 10/22/18 0821 10/22/18 1242 10/22/18 1553 10/22/18 1944 10/23/18 0335 10/24/18 0725 10/24/18 0841 10/25/18 0619  WBC 4.2 13.7*  --   --   --   --  13.7*  --  14.8* 21.6*  CREATININE 1.97* 3.20* 3.20*  --   --   --  2.42* 1.95*  --  2.00*  LATICACIDVEN  --   --  3.23* 3.7* 5.4* 2.1*  --   --   --   --     Estimated Creatinine Clearance: 25.3 mL/min (A) (by C-G formula based on SCr of 2 mg/dL (H)).    Allergies  Allergen Reactions  . Sulfa Antibiotics Rash    Antimicrobials this admission: PTA Acyclovir >>  1/3  Vancomycin & Cefepime x1 in ED  1/3 >> PO Vancomycin >> 1/5 1/3 >> Zosyn >> 1/5 Fluconazole >>   Dose adjustments this admission: 1/5 Zosyn 2.25gm q8 >> q6hr 1/6 Change to Zosyn 3.375g q8h extended infusion.  Microbiology results: 1/3 BCx: ngtd 1/3 MRSA PCR: neg GI panel ordered, not collected C.diff PCR never ordered  Thank you for allowing pharmacy to be a part of this patient's care.  Gretta Arab PharmD, BCPS Pager [phone] 10/25/2018 11:54 AM

## 2018-10-25 NOTE — Progress Notes (Signed)
[facility] PROGRESS NOTE    Progress Note  Abigail [name]  MRN:[id number] DOB: 09/10/1946 DOA: 10/22/2018 PCP: Avbuere, Edwin, MD     Brief Narrative:   Abigail [name] is an 73 y.o. female past medical history of multiple myeloma on Cytoxan, chronic kidney disease with a baseline creatinine 1.2, history of DVT A. fib, for which her Eliquis was recently stopped comes in complaining of worsening abdominal pain for 2 weeks. CT of the abdomen was obtained that showed left-sided colitis, critical care initially saw the patient and the patient decline aggressive intervention intubation and pressors Assessment/Plan:   Septic shock due to severe infectious colitis: Continue IV Zosyn, she is positive about 4 L. Allow clear liquid diet. Still fluid overloaded on physical exam we will not diurese. She has remained afebrile she would like to try full liquid diet. Her leukocytosis continues to worsen, will recheck in the morning. Relates she is feeling much better compared to yesterday.  She is slow to respond, we will continue to monitor her leukocytosis.  Acute kidney injury: Creatinine has stabilized at 2.  Baseline creatinine 1.3-1.6.  Will allow a clear liquid diet. Recheck a basic metabolic panel in the morning. To hold antihypertensive medication.  Non-anion gap metabolic acidosis: Likely due to renal dysfunction. Letter on bicarbonate tablets.  Bradycardia: Rate is improved continue to hold diltiazem as blood pressure borderline low. Continue amiodarone.  Prolonged QT: Resolved with repletion of electrolytes. Resume amiodarone  SVT: Keep mag greater than 2 potassium greater than 4.  Multiple myeloma: hold Cytoxan, continue acyclovir.  Anemia of chronic disease: Hemoglobin at baseline continue to monitor. Mild drop in hemoglobin continue to monitor.  Permanent atrial fibrillation: - Not on anticoagulation, her cardiologist held on admission 10/19/2018 due to nausea and a  drop in hemoglobin.   - Follow-up with cardiology as an outpatient to dictate whether to resume anticoagulation. Heart rate is controlled.    Elevated cardiac biomarkers: In the setting of demand ischemia she denies any chest pain or shortness of breath EKG showed no signs of ischemia.  Coronary atherosclerosis of native coronary artery Really asymptomatic, continue Lipitor.  Protein-calorie malnutrition, severe Lactic, will consult nutrition currently she is anorexic.  Oral thrush: Continue oral Diflucan. Allow a full liquid diet.  Goals of care: Patient would like to be full code.   DVT prophylaxis: lovenox Family Communication:none Disposition Plan/Barrier to D/C: unable to determine, keep in stepdown.   Code Status:     Code Status Orders  (From admission, onward)         Start     Ordered   10/22/18 1321  Do not attempt resuscitation (DNR)  Continuous    Question Answer Comment  In the event of cardiac or respiratory ARREST Do not call a "code blue"   In the event of cardiac or respiratory ARREST Do not perform Intubation, CPR, defibrillation or ACLS   In the event of cardiac or respiratory ARREST Use medication by any route, position, wound care, and other measures to relive pain and suffering. May use oxygen, suction and manual treatment of airway obstruction as needed for comfort.      10/22/18 1321        Code Status History    Date Active Date Inactive Code Status Order ID Comments User Context   10/22/2018 1227 10/22/2018 1321 DNR [id number]  Icard, Bradley L, DO ED   10/15/2018 1335 10/19/2018 1448 Full Code [id number]  Kadakia, Ajay, MD Inpatient   10/08/2018   1009 10/11/2018 1821 Full Code [id number]  Harwani, Mohan, MD Inpatient   10/02/2018 1639 10/03/2018 1856 Full Code [id number]  Harwani, Mohan, MD ED   09/15/2018 2244 09/17/2018 2100 Full Code [id number]  Kadakia, Ajay, MD ED   09/01/2018 1858 09/03/2018 1857 Full Code [id number]  Kadakia, Ajay, MD ED    12/14/2017 0459 12/17/2017 1558 Full Code [id number]  Kadakia, Ajay, MD ED   09/03/2017 0716 09/05/2017 1808 Full Code [id number]  Ellis, Allison L, NP ED   08/16/2017 2334 08/21/2017 1905 Full Code [id number]  Yates, Jennifer, MD Inpatient   06/23/2017 1213 06/25/2017 1516 Full Code [id number]  Kadakia, Ajay, MD ED   04/02/2017 0956 04/08/2017 1901 Full Code [id number]  Kadakia, Ajay, MD ED   03/04/2017 0808 03/12/2017 1728 Full Code [id number]  Kadakia, Ajay, MD ED   02/24/2017 2334 03/01/2017 1931 Full Code [id number]  Harwani, Mohan, MD Inpatient   02/24/2017 2333 02/24/2017 2334 Full Code [id number]  Chakravartti, Jaidip, MD Inpatient   01/21/2017 1738 01/25/2017 1834 Full Code [id number]  Kadakia, Ajay, MD Inpatient   12/29/2016 1415 01/02/2017 2239 Full Code [id number]  Harwani, Mohan, MD Inpatient   12/22/2016 1952 12/29/2016 1415 Full Code [id number]  Kadakia, Ajay, MD Inpatient   12/06/2016 1956 12/09/2016 2008 Full Code [id number]  Bhandari, Dron Prasad, MD Inpatient   11/23/2016 1644 11/27/2016 1819 Full Code [id number]  Elgergawy, Dawood S, MD Inpatient   11/17/2016 1404 11/21/2016 2008 Full Code [id number]  Grunz, Ryan B, MD Inpatient   06/12/2016 1556 06/19/2016 2203 Full Code [id number]  Kadakia, Ajay, MD Inpatient   01/11/2016 1608 01/14/2016 1420 Full Code [id number]  Kadakia, Ajay, MD Inpatient   11/01/2015 1735 11/04/2015 1738 Full Code [id number]  Kadakia, Ajay, MD Inpatient   12/31/2011 0405 01/02/2012 1640 Full Code [id number]  Spradling, Sharon Lynn, RN Inpatient        IV Access:    Peripheral IV   Procedures and diagnostic studies:   No results found.   Medical Consultants:    None.  Anti-Infectives:   IV vancomycin and Zosyn  Subjective:    [name] relates she feels slightly better today, she would like to try a full liquid diet.  Objective:    Vitals:   10/25/18 0402 10/25/18 0500 10/25/18 0620 10/25/18 0700  BP:  (!) 143/83 (!) 142/73 (!) 134/56  Pulse:  90 100 67  Resp:  19 20 12  Temp:  98.1 F (36.7 C)     TempSrc: Oral     SpO2:  99% 98% 98%  Weight:      Height:        Intake/Output Summary (Last 24 hours) at 10/25/2018 0724 Last data filed at 10/25/2018 0600 Gross per 24 hour  Intake 479.27 ml  Output 2150 ml  Net -1670.73 ml   Filed Weights   10/22/18 1524  Weight: 69.9 kg    Exam: General exam: In no acute distress. Respiratory system: Good air movement and clear to auscultation Cardiovascular system: Rate and rhythm with positive S1-S2, positive JVD. Gastrointestinal system: Positive bowel sounds soft tender but no rebound or guarding. Central nervous system: Alert and oriented. No focal neurological deficits. Extremities: Trace edema Skin: No rashes, lesions or ulcers Psychiatry: Judgement and insight appear normal. Mood & affect appropriate.    Data Reviewed:    Labs: Basic Metabolic Panel: Recent Labs  Lab 10/21/18 1055 10/22/18 0815 10/22/18 0821 10/23/18 0335 10/23/18 0730 10/24/18 0725 10/25/18 0619  NA 140 136 140 144  --    146* 149*  K 3.7 3.5 3.6 3.5  --  4.9 4.6  CL 110 105 111 119*  --  125* 128*  CO2 19* 18*  --  15*  --  11* 10*  GLUCOSE 115* 137* 133* 72  --  63* 70  BUN 19 27* 26* 27*  --  31* 28*  CREATININE 1.97* 3.20* 3.20* 2.42*  --  1.95* 2.00*  CALCIUM 9.1 8.9  --  7.0*  --  6.1* 6.7*  MG  --  1.7  --   --  2.3  --   --    GFR Estimated Creatinine Clearance: 25.3 mL/min (A) (by C-G formula based on SCr of 2 mg/dL (H)). Liver Function Tests: Recent Labs  Lab 10/22/18 0815 10/23/18 0335  AST 26 31  ALT 11 11  ALKPHOS 60 48  BILITOT 0.9 1.1  PROT 7.7 5.7*  ALBUMIN 4.0 2.7*   Recent Labs  Lab 10/22/18 0815  LIPASE 36   No results for input(s): AMMONIA in the last 168 hours. Coagulation profile Recent Labs  Lab 10/23/18 0335  INR 1.37    CBC: Recent Labs  Lab 10/21/18 1055 10/22/18 0815 10/22/18 0821 10/23/18 0335 10/24/18 0841 10/25/18 0619  WBC 4.2 13.7*  --  13.7* 14.8* 21.6*  NEUTROABS  2.9 12.0*  --   --  9.5*  --   HGB 9.1* 10.0* 11.6* 8.7* 8.3* 7.7*  HCT 30.2* 33.9* 34.0* 29.8* 26.5* 24.6*  MCV 97.7 98.0  --  102.4* 95.7 95.7  PLT 271 323  --  229 187 185   Cardiac Enzymes: Recent Labs  Lab 10/22/18 1553 10/22/18 1944  TROPONINI 0.11* 0.18*   BNP (last 3 results) No results for input(s): PROBNP in the last 8760 hours. CBG: Recent Labs  Lab 10/18/18 2209 10/18/18 2339 10/19/18 0734 10/24/18 1352 10/24/18 1714  GLUCAP 68* 84 76 65* 74   D-Dimer: No results for input(s): DDIMER in the last 72 hours. Hgb A1c: No results for input(s): HGBA1C in the last 72 hours. Lipid Profile: No results for input(s): CHOL, HDL, LDLCALC, TRIG, CHOLHDL, LDLDIRECT in the last 72 hours. Thyroid function studies: No results for input(s): TSH, T4TOTAL, T3FREE, THYROIDAB in the last 72 hours.  Invalid input(s): FREET3 Anemia work up: No results for input(s): VITAMINB12, FOLATE, FERRITIN, TIBC, IRON, RETICCTPCT in the last 72 hours. Sepsis Labs: Recent Labs  Lab 10/22/18 0815 10/22/18 5956 10/22/18 3875 10/22/18 1242 10/22/18 1553 10/22/18 1944 10/23/18 0335 10/24/18 0725 10/24/18 0841 10/25/18 0619  PROCALCITON  --   --  1.69 2.27  --   --  14.05 8.54  --   --   WBC 13.7*  --   --   --   --   --  13.7*  --  14.8* 21.6*  LATICACIDVEN  --  3.23*  --  3.7* 5.4* 2.1*  --   --   --   --    Microbiology Recent Results (from the past 240 hour(s))  Blood culture (routine x 2)     Status: None (Preliminary result)   Collection Time: 10/22/18  8:21 AM  Result Value Ref Range Status   Specimen Description BLOOD RIGHT ARM  Final   Special Requests   Final    BOTTLES DRAWN AEROBIC AND ANAEROBIC Blood Culture adequate volume Performed at [facility], [place] [address] [place], [place] [address]    Culture NO GROWTH 2 DAYS  Final   Report Status PENDING  Incomplete  Blood culture (routine x 2)     Status: None (Preliminary result)   Collection Time:  10/22/18  8:26 AM  Result Value Ref Range Status   Specimen Description BLOOD LEFT FOOT  Final   Special Requests   Final    BOTTLES DRAWN AEROBIC ONLY Blood Culture results may not be optimal due to an inadequate volume of blood received in culture bottles Performed at [place] [address], [place], [place] [address]    Culture NO GROWTH 2 DAYS  Final   Report Status PENDING  Incomplete  MRSA PCR Screening     Status: None   Collection Time: 10/22/18  3:18 PM  Result Value Ref Range Status   MRSA by PCR NEGATIVE NEGATIVE Final    Comment:        The GeneXpert MRSA Assay (FDA approved for NASAL specimens only), is one component of a comprehensive MRSA colonization surveillance program. It is not intended to diagnose MRSA infection nor to guide or monitor treatment for MRSA infections. Performed at [facility], [place] [address] [place], [place] [address]      Medications:   . acyclovir  400 mg Oral BID  . amiodarone  100 mg Oral Daily  . atorvastatin  40 mg Oral q1800  . calcium carbonate  1,000 mg of elemental calcium Oral TID WC  . [START ON 10/27/2018] calcium carbonate  1 tablet Oral Q breakfast  . enoxaparin (LOVENOX) injection  30 mg Subcutaneous Q24H  . ferrous sulfate  325 mg Oral Q breakfast  . pantoprazole  40 mg Oral BID AC  . pyridostigmine  30 mg Oral BID   Continuous Infusions: . sodium chloride 10 mL/hr at 10/24/18 1207  . fluconazole (DIFLUCAN) IV Stopped (10/24/18 0945)  . piperacillin-tazobactam (ZOSYN)  IV Stopped (10/25/18 0534)     LOS: 3 days   Charlynne Cousins  [facility]   *Please refer to [email or web address], password [email or web address] to get updated schedule on who will round on this patient, as hospitalists switch teams weekly. If 7PM-7AM, please contact night-coverage at [email or web address], password [email or web address] for any overnight needs.  10/25/2018, 7:24 AM

## 2018-10-25 NOTE — Progress Notes (Signed)
Per MD, have IV team attempt to place a PIV again as PICC may not be beneficial because patient may be discharged in 1-2 days.  Will contact IV team again.   Will continue to monitor.

## 2018-10-25 NOTE — Progress Notes (Addendum)
MD made aware that patient's left arm swollen in comparison to right. Site around PIV slightly red, no blood return from PIV. Charge RN asked to assess site and recommended not to use site. PIV removed. IV team asked to place PIV. IV team unable to place PIV despite using [place]. MD made aware that patient currently does not have IV access. Pharmacy made aware that unable to administer zosyn.  Awaiting further orders from MD.   Will continue to monitor.

## 2018-10-26 ENCOUNTER — Telehealth: Payer: Self-pay | Admitting: Nurse Practitioner

## 2018-10-26 ENCOUNTER — Ambulatory Visit: Payer: Medicare HMO | Admitting: Gastroenterology

## 2018-10-26 LAB — BASIC METABOLIC PANEL
Anion gap: 9 (ref 5–15)
BUN: 21 mg/dL (ref 8–23)
CO2: 16 mmol/L — ABNORMAL LOW (ref 22–32)
Calcium: 7.3 mg/dL — ABNORMAL LOW (ref 8.9–10.3)
Chloride: 124 mmol/L — ABNORMAL HIGH (ref 98–111)
Creatinine, Ser: 1.82 mg/dL — ABNORMAL HIGH (ref 0.44–1.00)
GFR calc Af Amer: 32 mL/min — ABNORMAL LOW (ref >60)
GFR, EST NON AFRICAN AMERICAN: 27 mL/min — AB (ref >60)
Glucose, Bld: 75 mg/dL (ref 70–99)
Potassium: 5.2 mmol/L — ABNORMAL HIGH (ref 3.5–5.1)
Sodium: 149 mmol/L — ABNORMAL HIGH (ref 135–145)

## 2018-10-26 LAB — CBC
HCT: 26.2 % — ABNORMAL LOW (ref 36.0–46.0)
HEMOGLOBIN: 7.7 g/dL — AB (ref 12.0–15.0)
MCH: 29.4 pg (ref 26.0–34.0)
MCHC: 29.4 g/dL — ABNORMAL LOW (ref 30.0–36.0)
MCV: 100 fL (ref 80.0–100.0)
Platelets: 195 10*3/uL (ref 150–400)
RBC: 2.62 MIL/uL — ABNORMAL LOW (ref 3.87–5.11)
RDW: 21.2 % — ABNORMAL HIGH (ref 11.5–15.5)
WBC: 18.6 10*3/uL — ABNORMAL HIGH (ref 4.0–10.5)
nRBC: 0.4 % — ABNORMAL HIGH (ref 0.0–0.2)

## 2018-10-26 MED ORDER — SODIUM BICARBONATE 650 MG PO TABS
1300.0000 mg | ORAL_TABLET | Freq: Three times a day (TID) | ORAL | Status: AC
  Administered 2018-10-26 (×3): 1300 mg via ORAL
  Filled 2018-10-26 (×3): qty 2

## 2018-10-26 MED ORDER — ENSURE ENLIVE PO LIQD
237.0000 mL | Freq: Three times a day (TID) | ORAL | Status: DC
  Administered 2018-10-27 – 2018-11-01 (×4): 237 mL via ORAL

## 2018-10-26 MED ORDER — FLUCONAZOLE 100 MG PO TABS
100.0000 mg | ORAL_TABLET | Freq: Every day | ORAL | Status: AC
  Administered 2018-10-26 – 2018-10-29 (×4): 100 mg via ORAL
  Filled 2018-10-26 (×4): qty 1

## 2018-10-26 MED ORDER — DILTIAZEM HCL 30 MG PO TABS
30.0000 mg | ORAL_TABLET | Freq: Two times a day (BID) | ORAL | Status: DC
  Administered 2018-10-26 – 2018-11-02 (×11): 30 mg via ORAL
  Filled 2018-10-26 (×15): qty 1

## 2018-10-26 NOTE — Progress Notes (Signed)
I discussed the case with Dr. Olevia Bowens.  Outpatient follow-up is scheduled at [facility] on 11/04/2018.  Please call oncology as needed.

## 2018-10-26 NOTE — Progress Notes (Addendum)
[facility] PROGRESS NOTE    Progress Note  Abigail [name]  MVE:[id number] DOB: 07/23/1946 DOA: 10/22/2018 PCP: Nolene Ebbs, MD     Brief Narrative:   Abigail [name] is an 73 y.o. female past medical history of multiple myeloma on Cytoxan, chronic kidney disease with a baseline creatinine 1.2, history of DVT A. fib, for which her Eliquis was recently stopped comes in complaining of worsening abdominal pain for 2 weeks. CT of the abdomen was obtained that showed left-sided colitis, critical care initially saw the patient and the patient decline aggressive intervention intubation and pressors Assessment/Plan:   Septic shock due to severe infectious colitis: Continue IV Zosyn, she is positive about 4 L. Allow clear liquid diet. Her leukocytosis is improved. We will not diurese we will let her auto diurese as she seems to be doing it by herself. She relates she feels better tolerating her diet is now having diarrhea.  Acute kidney injury: Creatinine has stabilized at 2.  Baseline creatinine 1.3-1.6.   Awaiting a clear liquid diet creatinine is improving slowly.  Close to baseline.  Non-anion gap metabolic acidosis: Likely due to renal dysfunction. Improving with bicarbonate tablets.  Bradycardia: Rate is improved continue to hold diltiazem as blood pressure borderline low. Continue amiodarone.  Prolonged QT: Resolved with repletion of electrolytes. Resume amiodarone  SVT: Keep mag greater than 2 potassium greater than 4.  Multiple myeloma: hold Cytoxan, continue acyclovir.  Anemia of chronic disease: Hemoglobin at baseline continue to monitor. Hemoglobin has remained stable.  Permanent atrial fibrillation: - Not on anticoagulation, her cardiologist held on admission 10/19/2018 due to nausea and a drop in hemoglobin.   - Follow-up with cardiology as an outpatient to dictate whether to resume anticoagulation. Heart rate is controlled.    Elevated cardiac biomarkers: In  the setting of demand ischemia she denies any chest pain or shortness of breath EKG showed no signs of ischemia.  Coronary atherosclerosis of native coronary artery Really asymptomatic, continue Lipitor.  Protein-calorie malnutrition, severe Lactic, will consult nutrition currently she is anorexic.  Oral thrush: Continue oral Diflucan. Allow a full liquid diet.  Goals of care: Patient would like to be full code.  New mild hyperkalemia: Likely due to kidney injury as her creatinine and her bicarb normalized I expect her hyperkalemia to improve we will recheck in the morning.   DVT prophylaxis: lovenox Family Communication:none Disposition Plan/Barrier to D/C: unable to determine, keep in stepdown.   Code Status:     Code Status Orders  (From admission, onward)         Start     Ordered   10/22/18 1321  Do not attempt resuscitation (DNR)  Continuous    Question Answer Comment  In the event of cardiac or respiratory ARREST Do not call a "code blue"   In the event of cardiac or respiratory ARREST Do not perform Intubation, CPR, defibrillation or ACLS   In the event of cardiac or respiratory ARREST Use medication by any route, position, wound care, and other measures to relive pain and suffering. May use oxygen, suction and manual treatment of airway obstruction as needed for comfort.      10/22/18 1321        Code Status History    Date Active Date Inactive Code Status Order ID Comments User Context   10/22/2018 1227 10/22/2018 1321 DNR [id number]  Garner Nash, DO ED   10/15/2018 1335 10/19/2018 1448 Full Code [id number]  [name], MD Inpatient  10/08/2018 1009 10/11/2018 1821 Full Code [id number]  Charolette Forward, MD Inpatient   104-23-202019 1639 10/03/2018 1856 Full Code [id number]  Charolette Forward, MD ED   09/15/2018 2244 09/17/2018 2100 Full Code [id number]  [name], MD ED   09/01/2018 1858 09/03/2018 1857 Full Code [id number]  [name], MD ED   12/14/2017 0459  12/17/2017 1558 Full Code [id number]  [name], MD ED   09/03/2017 0716 09/05/2017 1808 Full Code [id number]  Samella Parr, NP ED   08/16/2017 2334 08/21/2017 1905 Full Code [id number]  Karmen Bongo, MD Inpatient   06/23/2017 1213 06/25/2017 1516 Full Code [id number]  [name], MD ED   04/02/2017 0956 04/08/2017 1901 Full Code [id number]  [name], MD ED   03/04/2017 0808 03/12/2017 1728 Full Code [id number]  [name], MD ED   02/24/2017 2334 03/01/2017 1931 Full Code [id number]  Charolette Forward, MD Inpatient   02/24/2017 2333 02/24/2017 2334 Full Code [id number]  Doylene Canning, MD Inpatient   01/21/2017 1738 01/25/2017 1834 Full Code [id number]  [name], MD Inpatient   12/29/2016 1415 01/02/2017 2239 Full Code [id number]  Charolette Forward, MD Inpatient   12/22/2016 1952 12/29/2016 1415 Full Code [id number]  [name], MD Inpatient   12/06/2016 1956 12/09/2016 2008 Full Code [id number]  Rosita Fire, MD Inpatient   11/23/2016 1644 11/27/2016 1819 Full Code [id number]  Elgergawy, Silver Huguenin, MD Inpatient   11/17/2016 1404 11/21/2016 2008 Full Code [id number]  Patrecia Pour, MD Inpatient   06/12/2016 1556 06/19/2016 2203 Full Code [id number]  [name], MD Inpatient   01/11/2016 1608 01/14/2016 1420 Full Code [id number]  [name], MD Inpatient   11/01/2015 1735 11/04/2015 1738 Full Code [id number]  [name], MD Inpatient   12/31/2011 0405 01/02/2012 1640 Full Code [id number]  Freddy Jaksch, RN Inpatient        IV Access:    Peripheral IV   Procedures and diagnostic studies:   No results found.   Medical Consultants:    None.  Anti-Infectives:   IV vancomycin and Zosyn  Subjective:    Abigail [name] relates she feels slightly better today, she would like to try a full liquid diet.  SHe is having diarrhea.  Objective:    Vitals:   10/26/18 0000 10/26/18 0014 10/26/18 0200 10/26/18 0400  BP: (!) 111/42  (!) 127/49   Pulse: (!) 50  (!) 47   Resp: 13  (!) 9     Temp:  98.8 F (37.1 C)  98.4 F (36.9 C)  TempSrc:  Oral  Oral  SpO2: 100%  100%   Weight:      Height:        Intake/Output Summary (Last 24 hours) at 10/26/2018 0731 Last data filed at 10/26/2018 0526 Gross per 24 hour  Intake 99.31 ml  Output 875 ml  Net -775.69 ml   Filed Weights   10/22/18 1524  Weight: 69.9 kg    Exam: General exam: In no acute distress. Respiratory system: Good air movement and clear to auscultation Cardiovascular system: Rate and rhythm with positive S1-S2, negative JVD. Gastrointestinal system: Positive bowel sounds soft tender but no rebound or guarding. Central nervous system: Alert and oriented. No focal neurological deficits. Extremities: Trace edema Skin: No rashes, lesions or ulcers Psychiatry: Judgement and insight appear normal. Mood & affect appropriate.    Data Reviewed:    Labs: Basic Metabolic Panel: Recent Labs  Lab 10/22/18 0815 10/22/18 5809 10/23/18 0335 10/23/18 0730 10/24/18 0725 10/25/18  3810 10/26/18 0328  NA 136 140 144  --  146* 149* 149*  K 3.5 3.6 3.5  --  4.9 4.6 5.2*  CL 105 111 119*  --  125* 128* 124*  CO2 18*  --  15*  --  11* 10* 16*  GLUCOSE 137* 133* 72  --  63* 70 75  BUN 27* 26* 27*  --  31* 28* 21  CREATININE 3.20* 3.20* 2.42*  --  1.95* 2.00* 1.82*  CALCIUM 8.9  --  7.0*  --  6.1* 6.7* 7.3*  MG 1.7  --   --  2.3  --   --   --    GFR Estimated Creatinine Clearance: 27.8 mL/min (A) (by C-G formula based on SCr of 1.82 mg/dL (H)). Liver Function Tests: Recent Labs  Lab 10/22/18 0815 10/23/18 0335  AST 26 31  ALT 11 11  ALKPHOS 60 48  BILITOT 0.9 1.1  PROT 7.7 5.7*  ALBUMIN 4.0 2.7*   Recent Labs  Lab 10/22/18 0815  LIPASE 36   No results for input(s): AMMONIA in the last 168 hours. Coagulation profile Recent Labs  Lab 10/23/18 0335  INR 1.37    CBC: Recent Labs  Lab 10/21/18 1055  10/22/18 0815 10/22/18 0821 10/23/18 0335 10/24/18 0841 10/25/18 0619 10/26/18 0643  WBC  4.2  --  13.7*  --  13.7* 14.8* 21.6* 18.6*  NEUTROABS 2.9  --  12.0*  --   --  9.5*  --   --   HGB 9.1*   < > 10.0* 11.6* 8.7* 8.3* 7.7* 7.7*  HCT 30.2*  --  33.9* 34.0* 29.8* 26.5* 24.6* 26.2*  MCV 97.7  --  98.0  --  102.4* 95.7 95.7 100.0  PLT 271  --  323  --  229 187 185 195   < > = values in this interval not displayed.   Cardiac Enzymes: Recent Labs  Lab 10/22/18 1553 10/22/18 1944  TROPONINI 0.11* 0.18*   BNP (last 3 results) No results for input(s): PROBNP in the last 8760 hours. CBG: Recent Labs  Lab 10/19/18 0734 10/24/18 1352 10/24/18 1714  GLUCAP 76 65* 74   D-Dimer: No results for input(s): DDIMER in the last 72 hours. Hgb A1c: No results for input(s): HGBA1C in the last 72 hours. Lipid Profile: No results for input(s): CHOL, HDL, LDLCALC, TRIG, CHOLHDL, LDLDIRECT in the last 72 hours. Thyroid function studies: No results for input(s): TSH, T4TOTAL, T3FREE, THYROIDAB in the last 72 hours.  Invalid input(s): FREET3 Anemia work up: No results for input(s): VITAMINB12, FOLATE, FERRITIN, TIBC, IRON, RETICCTPCT in the last 72 hours. Sepsis Labs: Recent Labs  Lab 10/22/18 0821 10/22/18 1751 10/22/18 1242 10/22/18 1553 10/22/18 1944 10/23/18 0335 10/24/18 0725 10/24/18 0841 10/25/18 0619 10/26/18 0643  PROCALCITON  --  1.69 2.27  --   --  14.05 8.54  --   --   --   WBC  --   --   --   --   --  13.7*  --  14.8* 21.6* 18.6*  LATICACIDVEN 3.23*  --  3.7* 5.4* 2.1*  --   --   --   --   --    Microbiology Recent Results (from the past 240 hour(s))  Blood culture (routine x 2)     Status: None (Preliminary result)   Collection Time: 10/22/18  8:21 AM  Result Value Ref Range Status   Specimen Description   Final    BLOOD RIGHT  ARM Performed at [facility], [place] [address], [place], [place] [address]    Special Requests   Final    BOTTLES DRAWN AEROBIC AND ANAEROBIC Blood Culture adequate volume Performed at [place] [address], [place], [place] [address]    Culture   Final    NO GROWTH 4 DAYS Performed at [facility], [place] [address], [place], [place] [address]    Report Status PENDING  Incomplete  Blood culture (routine x 2)     Status: None (Preliminary result)   Collection Time: 10/22/18  8:26 AM  Result Value Ref Range Status   Specimen Description BLOOD LEFT FOOT  Final   Special Requests   Final    BOTTLES DRAWN AEROBIC ONLY Blood Culture results may not be optimal due to an inadequate volume of blood received in culture bottles   Culture   Final    NO GROWTH 4 DAYS Performed at [facility], [place] [address] [place], [place] [address]    Report Status PENDING  Incomplete  MRSA PCR Screening     Status: None   Collection Time: 10/22/18  3:18 PM  Result Value Ref Range Status   MRSA by PCR NEGATIVE NEGATIVE Final    Comment:        The GeneXpert MRSA Assay (FDA approved for NASAL specimens only), is one component of a comprehensive MRSA colonization surveillance program. It is not intended to diagnose MRSA infection nor to guide or monitor treatment for MRSA infections. Performed at [facility], [place] [address] [place], [place] [address]      Medications:   . acyclovir  400 mg Oral BID  . amiodarone  100 mg Oral Daily  . atorvastatin  40 mg Oral q1800  . calcium carbonate  1,000 mg of elemental calcium Oral TID WC  . [START ON 10/27/2018] calcium carbonate  1 tablet Oral Q breakfast  . enoxaparin (LOVENOX) injection  30 mg Subcutaneous Q24H  . feeding supplement (ENSURE ENLIVE)  237 mL Oral BID BM  . ferrous sulfate  325 mg Oral Q breakfast  . pantoprazole  40 mg Oral BID AC  . pyridostigmine  30 mg Oral BID   Continuous Infusions: . sodium chloride 10 mL/hr at 10/24/18 1207  . fluconazole (DIFLUCAN) IV Stopped (10/25/18 1013)  . piperacillin-tazobactam (ZOSYN)  IV 3.375 g (10/26/18 0526)     LOS: 4 days   Charlynne Cousins  [facility]   *Please refer to [email or web address], password [email or web address] to get updated schedule on who will round on this patient, as hospitalists switch teams weekly. If 7PM-7AM, please contact night-coverage at [email or web address], password [email or web address] for any overnight needs.  10/26/2018, 7:31 AM

## 2018-10-26 NOTE — Progress Notes (Signed)
Patient ID: Abigail [name], female   DOB: 15-Sep-1946, 73 y.o.   MRN: [id number]       Subjective: Patient states she feels better, but her actions suggest otherwise as she keeps her knees bent to her chest, barely answers questions, and won't open her eyes and has a grimace on her face.  However, she states she is eating her liquids well with no increase in pain.  States she is having more pudding like diarrhea, no bleed  Objective: Vital signs in last 24 hours: Temp:  [98.2 F (36.8 C)-98.8 F (37.1 C)] 98.2 F (36.8 C) (01/07 0800) Pulse Rate:  [32-96] 47 (01/07 0200) Resp:  [9-20] 9 (01/07 0200) BP: (111-137)/(42-83) 127/49 (01/07 0200) SpO2:  [98 %-100 %] 100 % (01/07 0200) Last BM Date: 10/22/17  Intake/Output from previous day: 01/06 0701 - 01/07 0700 In: 99.3 [IV Piggyback:99.3] Out: 875 [Urine:875] Intake/Output this shift: No intake/output data recorded.  PE: Heart: regular, but brady at times Lungs: CTAB Abd: soft, still tender on left side, but no guarding or rebounding, +BS, ND  Lab Results:  Recent Labs    10/25/18 0619 10/26/18 0643  WBC 21.6* 18.6*  HGB 7.7* 7.7*  HCT 24.6* 26.2*  PLT 185 195   BMET Recent Labs    10/25/18 0619 10/26/18 0328  NA 149* 149*  K 4.6 5.2*  CL 128* 124*  CO2 10* 16*  GLUCOSE 70 75  BUN 28* 21  CREATININE 2.00* 1.82*  CALCIUM 6.7* 7.3*   PT/INR No results for input(s): LABPROT, INR in the last 72 hours. CMP     Component Value Date/Time   NA 149 (H) 10/26/2018 0328   NA 144 09/17/2017 1429   K 5.2 (H) 10/26/2018 0328   K 3.1 (L) 09/17/2017 1429   CL 124 (H) 10/26/2018 0328   CO2 16 (L) 10/26/2018 0328   CO2 23 09/17/2017 1429   GLUCOSE 75 10/26/2018 0328   GLUCOSE 96 09/17/2017 1429   BUN 21 10/26/2018 0328   BUN 15.6 09/17/2017 1429   CREATININE 1.82 (H) 10/26/2018 0328   CREATININE 1.97 (H) 10/21/2018 1055   CREATININE 1.0 09/17/2017 1429   CALCIUM 7.3 (L) 10/26/2018 0328   CALCIUM 8.6 09/17/2017 1429   PROT 5.7 (L) 10/23/2018 0335   PROT 6.6 09/17/2017 1429   ALBUMIN 2.7 (L) 10/23/2018 0335   ALBUMIN 3.6 09/17/2017 1429   AST 31 10/23/2018 0335   AST 15 10/07/2018 0823   AST 14 09/17/2017 1429   ALT 11 10/23/2018 0335   ALT 7 10/07/2018 0823   ALT 11 09/17/2017 1429   ALKPHOS 48 10/23/2018 0335   ALKPHOS 93 09/17/2017 1429   BILITOT 1.1 10/23/2018 0335   BILITOT 1.1 10/07/2018 0823   BILITOT 1.22 (H) 09/17/2017 1429   GFRNONAA 27 (L) 10/26/2018 0328   GFRNONAA 25 (L) 10/21/2018 1055   GFRAA 32 (L) 10/26/2018 0328   GFRAA 29 (L) 10/21/2018 1055   Lipase     Component Value Date/Time   LIPASE 36 10/22/2018 0815       Studies/Results: No results found.  Anti-infectives: Anti-infectives (From admission, onward)   Start     Dose/Rate Route Frequency Ordered Stop   10/26/18 1000  fluconazole (DIFLUCAN) tablet 100 mg     100 mg Oral Daily 10/26/18 0743 10/30/18 0959   10/25/18 1230  piperacillin-tazobactam (ZOSYN) IVPB 3.375 g     3.375 g 12.5 mL/hr over 240 Minutes Intravenous Every 8 hours 10/25/18 1202  10/24/18 1000  fluconazole (DIFLUCAN) IVPB 100 mg  Status:  Discontinued     100 mg 50 mL/hr over 60 Minutes Intravenous Every 24 hours 10/24/18 0740 10/26/18 0743   10/22/18 2200  piperacillin-tazobactam (ZOSYN) IVPB 2.25 g  Status:  Discontinued     2.25 g 100 mL/hr over 30 Minutes Intravenous Every 8 hours 10/22/18 1241 10/25/18 1202   10/22/18 2200  acyclovir (ZOVIRAX) tablet 400 mg     400 mg Oral 2 times daily 10/22/18 1321     10/22/18 1545  vancomycin (VANCOCIN) 50 mg/mL oral solution 125 mg  Status:  Discontinued     125 mg Oral 4 times daily 10/22/18 1334 10/24/18 0738   10/22/18 1239  vancomycin variable dose per unstable renal function (pharmacist dosing)  Status:  Discontinued      Does not apply See admin instructions 10/22/18 1241 10/22/18 1321   10/22/18 0830  vancomycin (VANCOCIN) IVPB 1000 mg/200 mL premix     1,000 mg 200 mL/hr over 60 Minutes  Intravenous  Once 10/22/18 0828 10/22/18 1101   10/22/18 0830  ceFEPIme (MAXIPIME) 2 g in sodium chloride 0.9 % 100 mL IVPB     2 g 200 mL/hr over 30 Minutes Intravenous  Once 10/22/18 0828 10/22/18 0917       Assessment/Plan  Colitis in the setting of active chemo treatment -WBC down to 18K today from 21K yesterday -states pain is improving.  She received full liquids for breakfast this morning.  Will let her try that as she seems to have tolerated clear liquids well yesterday. -her actions suggests she is still having pain, but she clearly states her pain is better and she is overall better. -will continue conservative management.  FEN - FLD VTE - Lovenox ID - [email or web address]   LOS: 4 days    Henreitta Cea , [facility] 10/26/2018, 8:36 AM Pager: [phone]

## 2018-10-26 NOTE — Telephone Encounter (Signed)
Called to confirm appts cancelled on 1/9 per 1/7 sch message - unable to reach patient or leave message.  Called patients sister (dorothy )  - no answer left message that appts cancelled

## 2018-10-27 ENCOUNTER — Inpatient Hospital Stay: Payer: Self-pay

## 2018-10-27 DIAGNOSIS — I959 Hypotension, unspecified: Secondary | ICD-10-CM

## 2018-10-27 DIAGNOSIS — C9000 Multiple myeloma not having achieved remission: Secondary | ICD-10-CM

## 2018-10-27 LAB — RETICULOCYTES
Immature Retic Fract: 28.4 % — ABNORMAL HIGH (ref 2.3–15.9)
RBC.: 2.27 MIL/uL — ABNORMAL LOW (ref 3.87–5.11)
Retic Count, Absolute: 25.2 10*3/uL (ref 19.0–186.0)
Retic Ct Pct: 1.1 % (ref 0.4–3.1)

## 2018-10-27 LAB — BASIC METABOLIC PANEL
Anion gap: 7 (ref 5–15)
Anion gap: 9 (ref 5–15)
BUN: 17 mg/dL (ref 8–23)
BUN: 16 mg/dL (ref 8–23)
CO2: 18 mmol/L — ABNORMAL LOW (ref 22–32)
CO2: 20 mmol/L — AB (ref 22–32)
Calcium: 7.7 mg/dL — ABNORMAL LOW (ref 8.9–10.3)
Calcium: 7.8 mg/dL — ABNORMAL LOW (ref 8.9–10.3)
Chloride: 124 mmol/L — ABNORMAL HIGH (ref 98–111)
Chloride: 121 mmol/L — ABNORMAL HIGH (ref 98–111)
Creatinine, Ser: 1.57 mg/dL — ABNORMAL HIGH (ref 0.44–1.00)
Creatinine, Ser: 1.58 mg/dL — ABNORMAL HIGH (ref 0.44–1.00)
GFR calc Af Amer: 38 mL/min — ABNORMAL LOW (ref >60)
GFR calc Af Amer: 38 mL/min — ABNORMAL LOW (ref >60)
GFR calc non Af Amer: 33 mL/min — ABNORMAL LOW (ref >60)
GFR calc non Af Amer: 33 mL/min — ABNORMAL LOW (ref >60)
Glucose, Bld: 82 mg/dL (ref 70–99)
Glucose, Bld: 105 mg/dL — ABNORMAL HIGH (ref 70–99)
Potassium: 2.9 mmol/L — ABNORMAL LOW (ref 3.5–5.1)
Potassium: 3.3 mmol/L — ABNORMAL LOW (ref 3.5–5.1)
Sodium: 149 mmol/L — ABNORMAL HIGH (ref 135–145)
Sodium: 150 mmol/L — ABNORMAL HIGH (ref 135–145)

## 2018-10-27 LAB — CBC WITH DIFFERENTIAL/PLATELET
Abs Immature Granulocytes: 0.98 10*3/uL — ABNORMAL HIGH (ref 0.00–0.07)
BASOS ABS: 0 10*3/uL (ref 0.0–0.1)
Basophils Relative: 0 %
Eosinophils Absolute: 0 10*3/uL (ref 0.0–0.5)
Eosinophils Relative: 0 %
HCT: 23.6 % — ABNORMAL LOW (ref 36.0–46.0)
Hemoglobin: 6.8 g/dL — CL (ref 12.0–15.0)
Immature Granulocytes: 7 %
Lymphocytes Relative: 4 %
Lymphs Abs: 0.6 10*3/uL — ABNORMAL LOW (ref 0.7–4.0)
MCH: 29.8 pg (ref 26.0–34.0)
MCHC: 28.8 g/dL — ABNORMAL LOW (ref 30.0–36.0)
MCV: 103.5 fL — ABNORMAL HIGH (ref 80.0–100.0)
Monocytes Absolute: 1.9 10*3/uL — ABNORMAL HIGH (ref 0.1–1.0)
Monocytes Relative: 14 %
NRBC: 0.4 % — AB (ref 0.0–0.2)
Neutro Abs: 10.2 10*3/uL — ABNORMAL HIGH (ref 1.7–7.7)
Neutrophils Relative %: 75 %
PLATELETS: 155 10*3/uL (ref 150–400)
RBC: 2.28 MIL/uL — AB (ref 3.87–5.11)
RDW: 21.3 % — ABNORMAL HIGH (ref 11.5–15.5)
WBC: 13.7 10*3/uL — AB (ref 4.0–10.5)

## 2018-10-27 LAB — CULTURE, BLOOD (ROUTINE X 2)
Culture: NO GROWTH
Culture: NO GROWTH
Special Requests: ADEQUATE

## 2018-10-27 LAB — IRON AND TIBC
Iron: 42 ug/dL (ref 28–170)
Saturation Ratios: 37 % — ABNORMAL HIGH (ref 10.4–31.8)
TIBC: 115 ug/dL — ABNORMAL LOW (ref 250–450)
UIBC: 73 ug/dL

## 2018-10-27 LAB — PREPARE RBC (CROSSMATCH)

## 2018-10-27 LAB — FERRITIN: FERRITIN: 912 ng/mL — AB (ref 11–307)

## 2018-10-27 LAB — FOLATE: FOLATE: 7.5 ng/mL (ref >5.9)

## 2018-10-27 LAB — MAGNESIUM: Magnesium: 1.9 mg/dL (ref 1.7–2.4)

## 2018-10-27 LAB — VITAMIN B12: Vitamin B-12: 2725 pg/mL — ABNORMAL HIGH (ref 180–914)

## 2018-10-27 MED ORDER — DEXTROSE 5 % IV SOLN
INTRAVENOUS | Status: DC
  Administered 2018-10-27 – 2018-10-29 (×3): via INTRAVENOUS

## 2018-10-27 MED ORDER — ENOXAPARIN SODIUM 40 MG/0.4ML ~~LOC~~ SOLN
40.0000 mg | SUBCUTANEOUS | Status: DC
  Administered 2018-10-27 – 2018-10-28 (×2): 40 mg via SUBCUTANEOUS
  Filled 2018-10-27 (×2): qty 0.4

## 2018-10-27 MED ORDER — POTASSIUM CHLORIDE CRYS ER 20 MEQ PO TBCR
40.0000 meq | EXTENDED_RELEASE_TABLET | Freq: Once | ORAL | Status: AC
  Administered 2018-10-27: 40 meq via ORAL
  Filled 2018-10-27: qty 2

## 2018-10-27 MED ORDER — SODIUM CHLORIDE 0.9% FLUSH
10.0000 mL | Freq: Two times a day (BID) | INTRAVENOUS | Status: DC
  Administered 2018-10-27 – 2018-10-29 (×2): 10 mL
  Administered 2018-10-29: 20 mL
  Administered 2018-11-02: 10 mL

## 2018-10-27 MED ORDER — SODIUM CHLORIDE 0.9% FLUSH
10.0000 mL | INTRAVENOUS | Status: DC | PRN
  Administered 2018-10-28: 20 mL
  Filled 2018-10-27: qty 40

## 2018-10-27 MED ORDER — POTASSIUM CHLORIDE CRYS ER 20 MEQ PO TBCR
20.0000 meq | EXTENDED_RELEASE_TABLET | Freq: Once | ORAL | Status: AC
  Administered 2018-10-27: 20 meq via ORAL
  Filled 2018-10-27: qty 1

## 2018-10-27 MED ORDER — SODIUM CHLORIDE 0.9% IV SOLUTION
Freq: Once | INTRAVENOUS | Status: AC
  Administered 2018-10-27: 15:00:00 via INTRAVENOUS

## 2018-10-27 MED ORDER — SODIUM CHLORIDE 0.9% IV SOLUTION: Freq: Once | INTRAVENOUS | Status: DC

## 2018-10-27 MED ORDER — FUROSEMIDE 10 MG/ML IJ SOLN
20.0000 mg | Freq: Once | INTRAMUSCULAR | Status: AC
  Administered 2018-10-27: 20 mg via INTRAVENOUS
  Filled 2018-10-27: qty 2

## 2018-10-27 MED ORDER — DIPHENHYDRAMINE HCL 25 MG PO CAPS
25.0000 mg | ORAL_CAPSULE | Freq: Once | ORAL | Status: AC
  Administered 2018-10-27: 25 mg via ORAL
  Filled 2018-10-27: qty 1

## 2018-10-27 MED ORDER — ACETAMINOPHEN 325 MG PO TABS
650.0000 mg | ORAL_TABLET | Freq: Once | ORAL | Status: AC
  Administered 2018-10-27: 650 mg via ORAL
  Filled 2018-10-27: qty 2

## 2018-10-27 NOTE — Care Management Important Message (Signed)
Important Message  Patient Details  Name: Abigail [name] MRN: [id number] Date of Birth: Jan 30, 1946   Medicare Important Message Given:  Yes    Kerin Salen 10/27/2018, 11:31 AMImportant Message  Patient Details  Name: Abigail [name] MRN: [id number] Date of Birth: 1946-01-14   Medicare Important Message Given:  Yes    Kerin Salen 10/27/2018, 11:31 AM

## 2018-10-27 NOTE — Progress Notes (Addendum)
Notified MD about blood transfusion. Patient will need the second unit of RBCs tonight after midline inserted and have order to check H&H after blood transfusion.

## 2018-10-27 NOTE — Progress Notes (Signed)
House Supervisor verified with MD that only two total units of PRB's to be infused and repeat CBC in am. Lasix IV given between units per order. Consent on the chart. Reviewed with patient signs/symptoms of adverse reaction with understanding voiced. 2nd unit initiated at 2100. Remained with patient for 15 minutes.

## 2018-10-27 NOTE — Progress Notes (Signed)
[place] with Nurse Rona Ravens regarding orders.  Per Nurse Rona Ravens patient needs midline not PICC for IV access, understands that midline is single lumen.  Orders for PICC with comment for Midline placement as well as Midline order, therefore as per above conversation Midline catheter inserted at [facility].  Carolee Rota, RN [facility]

## 2018-10-27 NOTE — Progress Notes (Signed)
PT Cancellation Note  Patient Details Name: Abigail [name] MRN: [id number] DOB: Jun 19, 1946   Cancelled Treatment:     HgB 6.8 receiving blood.  Pt has been evaluated with rec for SNF will cont to follow for mobility and placement.   Rica Koyanagi  PTA Acute  Rehabilitation Services Pager      [phone] Office      [phone]

## 2018-10-27 NOTE — Progress Notes (Signed)
PROGRESS NOTE    Abigail [name]  WFU:[id number] DOB: 04-20-1946 DOA: 10/22/2018 PCP: Nolene Ebbs, MD    Brief Narrative:  Abigail [name] is an 73 y.o. female past medical history of multiple myeloma on Cytoxan, chronic kidney disease with a baseline creatinine 1.2, history of DVT A. fib, for which her Eliquis was recently stopped comes in complaining of worsening abdominal pain for 2 weeks. CT of the abdomen was obtained that showed left-sided colitis, critical care initially saw the patient and the patient decline aggressive intervention intubation and pressors   Assessment & Plan:   Principal Problem:   Sepsis ([place]) Active Problems:   Coronary atherosclerosis of native coronary artery   Protein-calorie malnutrition, severe   Tachycardia-bradycardia ([place])   Essential hypertension   Multiple myeloma in relapse ([place])   Acute kidney injury superimposed on chronic kidney disease ([place])   Colitis   Bradycardia   AF (paroxysmal atrial fibrillation) ([place])   Septic shock ([place])  Septic shock due to severe infectious colitis: Improving clinically.  Patient afebrile.  Leukocytosis trending down.  Patient tolerating full liquid diet and diet has been advanced to a soft diet.  General surgery.  Patient having bowel movements.  I/Os not correctly reported.  Continue IV Zosyn.  General surgery following and appreciate their input and recommendations.  Acute kidney injury: Baseline creatinine 1.3-1.6.    Creatinine trending down and currently around baseline at 1.58.  Patient tolerating current diet.  Patient hypernatremic and as such we will place on D5W and monitor closely.  Non-anion gap metabolic acidosis: Likely due to renal dysfunction.  Improving with bicarbonate tablets.  Follow.  Bradycardia: Rate is improved continue to hold diltiazem as blood pressure borderline low. Continue amiodarone.  Prolonged QT: Amiodarone has been resumed.  Repeat EKG.  Follow.  SVT: Keep mag greater  than 2 potassium greater than 4.  Multiple myeloma: Continue to hold Cytoxan.  Continue acyclovir.  Outpatient follow-up with oncology/hematology.    Anemia of chronic disease: Hemoglobin dropped to 6.8 this morning likely dilutional component.  Patient denies any overt bleeding.  Check an anemia panel.  Transfuse 2 units packed red blood cells.  Follow H&H.  Permanent atrial fibrillation: - Not on anticoagulation, her cardiologist held on admission 10/19/2018 due to nausea and a drop in hemoglobin.   - Follow-up with cardiology as an outpatient to dictate whether to resume anticoagulation. Heart rate is controlled.    Elevated cardiac biomarkers: In the setting of demand ischemia she denies any chest pain or shortness of breath EKG showed no signs of ischemia.  Coronary atherosclerosis of native coronary artery Asymptomatic.  Continue statin.   Protein-calorie malnutrition, severe Once tolerating oral intake with clinical improvement, will place on nutritional supplementation of Ensure.   Oral thrush: Continue oral Diflucan.  Repeat EKG for QT prolongation.  Continue current diet.  Goals of care: Patient would like to be full code.  New mild hyperkalemia: Likely due to kidney injury as her creatinine and her bicarb normalized.  Patient now hypokalemic with potassium of 2.9.  Follow.    Hypokalemia Replete potassium and monitor renal function closely.  Repeat BMET this afternoon.  Follow.  Hypernatremia Likely secondary to volume depletion.  Place on D5W.  Follow.    DVT prophylaxis: Lovenox Code Status: Full Family Communication: Updated patient.  No family at bedside. Disposition Plan: To be determined.   Consultants:   General surgery: Dr. 10/22/2018  Curbside oncology: Dr. Benay Spice  Procedures:   CT abdomen  and pelvis 10/22/2018  Chest x-ray 10/22/2018, 10/23/2018  Midline pending  Antimicrobials:   IV vancomycin 10/22/2018>>>> 10/24/2018  IV Zosyn  10/22/2018>>>>   Subjective: Patient sleeping however easily arousable.  States abdominal pain improving.  States having bowel movements.  Denies any nausea or vomiting.  Tolerating clears.  Objective: Vitals:   10/26/18 2000 10/26/18 2106 10/27/18 0434 10/27/18 0850  BP: (!) 116/56 129/69 135/67   Pulse: (!) 45 (!) 52 (!) 51   Resp: '10 16 16   ' Temp:  98.7 F (37.1 C) 99.2 F (37.3 C)   TempSrc:      SpO2: 100% 100% 100%   Weight:    74.5 kg  Height:        Intake/Output Summary (Last 24 hours) at 10/27/2018 1129 Last data filed at 10/26/2018 1807 Gross per 24 hour  Intake 68.22 ml  Output 125 ml  Net -56.78 ml   Filed Weights   10/22/18 1524 10/27/18 0850  Weight: 69.9 kg 74.5 kg    Examination:  General exam: Appears calm and comfortable  Respiratory system: Clear to auscultation. Respiratory effort normal. Cardiovascular system: S1 & S2 heard, RRR. No JVD, murmurs, rubs, gallops or clicks. No pedal edema. Gastrointestinal system: Abdomen is soft, tender to palpation left lower quadrant greater than right lower quadrant.  Positive bowel bowel sounds.  No rebound.  No guarding.  Central nervous system: Alert and oriented. No focal neurological deficits. Extremities: Symmetric 5 x 5 power. Skin: No rashes, lesions or ulcers Psychiatry: Judgement and insight appear normal. Mood & affect appropriate.     Data Reviewed: I have personally reviewed following labs and imaging studies  CBC: Recent Labs  Lab 10/21/18 1055  10/22/18 0815  10/23/18 0335 10/24/18 0841 10/25/18 0619 10/26/18 0643 10/27/18 0610  WBC 4.2   < > 13.7*  --  13.7* 14.8* 21.6* 18.6* 13.7*  NEUTROABS 2.9  --  12.0*  --   --  9.5*  --   --  10.2*  HGB 9.1*  --  10.0*   < > 8.7* 8.3* 7.7* 7.7* 6.8*  HCT 30.2*  --  33.9*   < > 29.8* 26.5* 24.6* 26.2* 23.6*  MCV 97.7  --  98.0  --  102.4* 95.7 95.7 100.0 103.5*  PLT 271   < > 323  --  229 187 185 195 155   < > = values in this interval not  displayed.   Basic Metabolic Panel: Recent Labs  Lab 10/22/18 0815  10/23/18 0335 10/23/18 0730 10/24/18 0725 10/25/18 9767 10/26/18 0328 10/27/18 0610  NA 136   < > 144  --  146* 149* 149* 149*  K 3.5   < > 3.5  --  4.9 4.6 5.2* 2.9*  CL 105   < > 119*  --  125* 128* 124* 124*  CO2 18*  --  15*  --  11* 10* 16* 18*  GLUCOSE 137*   < > 72  --  63* 70 75 82  BUN 27*   < > 27*  --  31* 28* 21 17  CREATININE 3.20*   < > 2.42*  --  1.95* 2.00* 1.82* 1.57*  CALCIUM 8.9  --  7.0*  --  6.1* 6.7* 7.3* 7.7*  MG 1.7  --   --  2.3  --   --   --  1.9   < > = values in this interval not displayed.   GFR: Estimated Creatinine Clearance: 33.2 mL/min (A) (  by C-G formula based on SCr of 1.57 mg/dL (H)). Liver Function Tests: Recent Labs  Lab 10/22/18 0815 10/23/18 0335  AST 26 31  ALT 11 11  ALKPHOS 60 48  BILITOT 0.9 1.1  PROT 7.7 5.7*  ALBUMIN 4.0 2.7*   Recent Labs  Lab 10/22/18 0815  LIPASE 36   No results for input(s): AMMONIA in the last 168 hours. Coagulation Profile: Recent Labs  Lab 10/23/18 0335  INR 1.37   Cardiac Enzymes: Recent Labs  Lab 10/22/18 1553 10/22/18 1944  TROPONINI 0.11* 0.18*   BNP (last 3 results) No results for input(s): PROBNP in the last 8760 hours. HbA1C: No results for input(s): HGBA1C in the last 72 hours. CBG: Recent Labs  Lab 10/24/18 1352 10/24/18 1714  GLUCAP 65* 74   Lipid Profile: No results for input(s): CHOL, HDL, LDLCALC, TRIG, CHOLHDL, LDLDIRECT in the last 72 hours. Thyroid Function Tests: No results for input(s): TSH, T4TOTAL, FREET4, T3FREE, THYROIDAB in the last 72 hours. Anemia Panel: Recent Labs    10/27/18 0610  RETICCTPCT 1.1   Sepsis Labs: Recent Labs  Lab 10/22/18 1610 10/22/18 0823 10/22/18 1242 10/22/18 1553 10/22/18 1944 10/23/18 0335 10/24/18 0725  PROCALCITON  --  1.69 2.27  --   --  14.05 8.54  LATICACIDVEN 3.23*  --  3.7* 5.4* 2.1*  --   --     Recent Results (from the past 240  hour(s))  Blood culture (routine x 2)     Status: None   Collection Time: 10/22/18  8:21 AM  Result Value Ref Range Status   Specimen Description   Final    BLOOD RIGHT ARM Performed at [facility] [address] [place], [place] [address]    Special Requests   Final    BOTTLES DRAWN AEROBIC AND ANAEROBIC Blood Culture adequate volume Performed at [facility] [address], [place], [place] [address]    Culture   Final    NO GROWTH 5 DAYS Performed at [facility], [place] [address] [place], [place] [address]    Report Status 10/27/2018 FINAL  Final  Blood culture (routine x 2)     Status: None   Collection Time: 10/22/18  8:26 AM  Result Value Ref Range Status   Specimen Description BLOOD LEFT FOOT  Final   Special Requests   Final    BOTTLES DRAWN AEROBIC ONLY Blood Culture results may not be optimal due to an inadequate volume of blood received in culture bottles   Culture   Final    NO GROWTH 5 DAYS Performed at [facility], [place] [address] [place], [place] [address]    Report Status 10/27/2018 FINAL  Final  MRSA PCR Screening     Status: None   Collection Time: 10/22/18  3:18 PM  Result Value Ref Range Status   MRSA by PCR NEGATIVE NEGATIVE Final    Comment:        The GeneXpert MRSA Assay (FDA approved for NASAL specimens only), is one component of a comprehensive MRSA colonization surveillance program. It is not intended to diagnose MRSA infection nor to guide or monitor treatment for MRSA infections. Performed at [facility], [place] [address] [place], [place] [address]          Radiology Studies: No results found.      Scheduled Meds: . sodium chloride   Intravenous Once  . acetaminophen  650 mg Oral Once  .  acyclovir  400 mg Oral BID  . amiodarone  100 mg Oral Daily  . atorvastatin  40 mg Oral q1800  . calcium carbonate  1 tablet Oral Q breakfast  . diltiazem  30 mg Oral  Q12H  . diphenhydrAMINE  25 mg Oral Once  . enoxaparin (LOVENOX) injection  40 mg Subcutaneous Q24H  . feeding supplement (ENSURE ENLIVE)  237 mL Oral TID BM  . ferrous sulfate  325 mg Oral Q breakfast  . fluconazole  100 mg Oral Daily  . furosemide  20 mg Intravenous Once  . pantoprazole  40 mg Oral BID AC  . pyridostigmine  30 mg Oral BID   Continuous Infusions: . sodium chloride 10 mL/hr at 10/26/18 2301  . dextrose 100 mL/hr at 10/27/18 0947  . piperacillin-tazobactam (ZOSYN)  IV 3.375 g (10/27/18 0512)     LOS: 5 days    Time spent: 40 minutes    Irine Seal, MD [facility]  If 7PM-7AM, please contact night-coverage [email or web address] Password [facility] 10/27/2018, 11:29 AM

## 2018-10-27 NOTE — Progress Notes (Addendum)
Patient's IV was infiltrated and patient complained about pain while blood transfusing. RN had to take IV off and could not finish whole bag of 1st unit of RBC. Paged MD as well to request for midline order after IV team tried and could not get any IV on patient. Waiting for order. Vital signs was taken and chart on epic.   RN could not finish Iv zosyn due to her IV infiltrated and had to take out.

## 2018-10-27 NOTE — Progress Notes (Signed)
Patient ID: Abigail [name], female   DOB: 14-Oct-1946, 73 y.o.   MRN: [id number]       Subjective: Patient states her pain is much better today.  She is moving her bowels and denies any blood in her stool.  No nausea or vomiting and tolerating full liquids well.    Objective: Vital signs in last 24 hours: Temp:  [97.6 F (36.4 C)-99.2 F (37.3 C)] 99.2 F (37.3 C) (01/08 0434) Pulse Rate:  [37-69] 51 (01/08 0434) Resp:  [8-19] 16 (01/08 0434) BP: (110-135)/(56-96) 135/67 (01/08 0434) SpO2:  [82 %-100 %] 100 % (01/08 0434) Weight:  [74.5 kg] 74.5 kg (01/08 0850) Last BM Date: 10/26/18  Intake/Output from previous day: 01/07 0701 - 01/08 0700 In: 149.9 [IV Piggyback:149.9] Out: 275 [Urine:275] Intake/Output this shift: No intake/output data recorded.  PE: Abd: soft, still with some tenderness on the left side of her abdomen, but improving, +BS, ND  Lab Results:  Recent Labs    10/26/18 0643 10/27/18 0610  WBC 18.6* 13.7*  HGB 7.7* 6.8*  HCT 26.2* 23.6*  PLT 195 155   BMET Recent Labs    10/26/18 0328 10/27/18 0610  NA 149* 149*  K 5.2* 2.9*  CL 124* 124*  CO2 16* 18*  GLUCOSE 75 82  BUN 21 17  CREATININE 1.82* 1.57*  CALCIUM 7.3* 7.7*   PT/INR No results for input(s): LABPROT, INR in the last 72 hours. CMP     Component Value Date/Time   NA 149 (H) 10/27/2018 0610   NA 144 09/17/2017 1429   K 2.9 (L) 10/27/2018 0610   K 3.1 (L) 09/17/2017 1429   CL 124 (H) 10/27/2018 0610   CO2 18 (L) 10/27/2018 0610   CO2 23 09/17/2017 1429   GLUCOSE 82 10/27/2018 0610   GLUCOSE 96 09/17/2017 1429   BUN 17 10/27/2018 0610   BUN 15.6 09/17/2017 1429   CREATININE 1.57 (H) 10/27/2018 0610   CREATININE 1.97 (H) 10/21/2018 1055   CREATININE 1.0 09/17/2017 1429   CALCIUM 7.7 (L) 10/27/2018 0610   CALCIUM 8.6 09/17/2017 1429   PROT 5.7 (L) 10/23/2018 0335   PROT 6.6 09/17/2017 1429   ALBUMIN 2.7 (L) 10/23/2018 0335   ALBUMIN 3.6 09/17/2017 1429   AST 31 10/23/2018 0335     AST 15 10/07/2018 0823   AST 14 09/17/2017 1429   ALT 11 10/23/2018 0335   ALT 7 10/07/2018 0823   ALT 11 09/17/2017 1429   ALKPHOS 48 10/23/2018 0335   ALKPHOS 93 09/17/2017 1429   BILITOT 1.1 10/23/2018 0335   BILITOT 1.1 10/07/2018 0823   BILITOT 1.22 (H) 09/17/2017 1429   GFRNONAA 33 (L) 10/27/2018 0610   GFRNONAA 25 (L) 10/21/2018 1055   GFRAA 38 (L) 10/27/2018 0610   GFRAA 29 (L) 10/21/2018 1055   Lipase     Component Value Date/Time   LIPASE 36 10/22/2018 0815       Studies/Results: No results found.  Anti-infectives: Anti-infectives (From admission, onward)   Start     Dose/Rate Route Frequency Ordered Stop   10/26/18 1000  fluconazole (DIFLUCAN) tablet 100 mg     100 mg Oral Daily 10/26/18 0743 10/30/18 0959   10/25/18 1230  piperacillin-tazobactam (ZOSYN) IVPB 3.375 g     3.375 g 12.5 mL/hr over 240 Minutes Intravenous Every 8 hours 10/25/18 1202     10/24/18 1000  fluconazole (DIFLUCAN) IVPB 100 mg  Status:  Discontinued     100 mg 50 mL/hr  over 60 Minutes Intravenous Every 24 hours 10/24/18 0740 10/26/18 0743   10/22/18 2200  piperacillin-tazobactam (ZOSYN) IVPB 2.25 g  Status:  Discontinued     2.25 g 100 mL/hr over 30 Minutes Intravenous Every 8 hours 10/22/18 1241 10/25/18 1202   10/22/18 2200  acyclovir (ZOVIRAX) tablet 400 mg     400 mg Oral 2 times daily 10/22/18 1321     10/22/18 1545  vancomycin (VANCOCIN) 50 mg/mL oral solution 125 mg  Status:  Discontinued     125 mg Oral 4 times daily 10/22/18 1334 10/24/18 0738   10/22/18 1239  vancomycin variable dose per unstable renal function (pharmacist dosing)  Status:  Discontinued      Does not apply See admin instructions 10/22/18 1241 10/22/18 1321   10/22/18 0830  vancomycin (VANCOCIN) IVPB 1000 mg/200 mL premix     1,000 mg 200 mL/hr over 60 Minutes Intravenous  Once 10/22/18 0828 10/22/18 1101   10/22/18 0830  ceFEPIme (MAXIPIME) 2 g in sodium chloride 0.9 % 100 mL IVPB     2 g 200 mL/hr over  30 Minutes Intravenous  Once 10/22/18 0828 10/22/18 0917       Assessment/Plan  Colitis in the setting of active chemo treatment -WBC down to 13K today -states pain is improving.  will adv to soft diet today  Anemia -hgb 6.8 today.  Patient denies blood in stool.  Will defer to medicine on need for transfusion.  FEN - soft diet VTE - Lovenox ID - zosyn   LOS: 5 days    Henreitta Cea , [facility] 10/27/2018, 10:49 AM Pager: [phone]

## 2018-10-27 NOTE — Progress Notes (Signed)
CRITICAL VALUE ALERT  Critical Value:  Hgb= 6.8  Date & Time Notied:  10/27/18 at 1000  Provider Notified: MD Grandville Silos  Orders Received/Actions taken: waiting for order.

## 2018-10-28 ENCOUNTER — Other Ambulatory Visit: Payer: Medicare HMO

## 2018-10-28 ENCOUNTER — Ambulatory Visit: Payer: Medicare HMO | Admitting: Nurse Practitioner

## 2018-10-28 ENCOUNTER — Ambulatory Visit: Payer: Medicare HMO

## 2018-10-28 LAB — BPAM RBC
Blood Product Expiration Date: 202002092359
Blood Product Expiration Date: 202002092359
ISSUE DATE / TIME: 202001081505
ISSUE DATE / TIME: 202001082014
Unit Type and Rh: 5100
Unit Type and Rh: 5100

## 2018-10-28 LAB — CBC
HEMATOCRIT: 29.3 % — AB (ref 36.0–46.0)
Hemoglobin: 8.9 g/dL — ABNORMAL LOW (ref 12.0–15.0)
MCH: 28.8 pg (ref 26.0–34.0)
MCHC: 30.4 g/dL (ref 30.0–36.0)
MCV: 94.8 fL (ref 80.0–100.0)
Platelets: 172 10*3/uL (ref 150–400)
RBC: 3.09 MIL/uL — ABNORMAL LOW (ref 3.87–5.11)
RDW: 20.9 % — ABNORMAL HIGH (ref 11.5–15.5)
WBC: 13 10*3/uL — ABNORMAL HIGH (ref 4.0–10.5)
nRBC: 1.1 % — ABNORMAL HIGH (ref 0.0–0.2)

## 2018-10-28 LAB — TYPE AND SCREEN
ABO/RH(D): O POS
Antibody Screen: NEGATIVE
Donor AG Type: NEGATIVE
Donor AG Type: NEGATIVE
Unit division: 0
Unit division: 0

## 2018-10-28 LAB — BASIC METABOLIC PANEL
Anion gap: 10 (ref 5–15)
Anion gap: 6 (ref 5–15)
BUN: 16 mg/dL (ref 8–23)
BUN: 15 mg/dL (ref 8–23)
CO2: 21 mmol/L — ABNORMAL LOW (ref 22–32)
CO2: 23 mmol/L (ref 22–32)
Calcium: 8.5 mg/dL — ABNORMAL LOW (ref 8.9–10.3)
Calcium: 8.2 mg/dL — ABNORMAL LOW (ref 8.9–10.3)
Chloride: 119 mmol/L — ABNORMAL HIGH (ref 98–111)
Chloride: 119 mmol/L — ABNORMAL HIGH (ref 98–111)
Creatinine, Ser: 1.58 mg/dL — ABNORMAL HIGH (ref 0.44–1.00)
Creatinine, Ser: 1.58 mg/dL — ABNORMAL HIGH (ref 0.44–1.00)
GFR calc Af Amer: 38 mL/min — ABNORMAL LOW (ref >60)
GFR calc Af Amer: 38 mL/min — ABNORMAL LOW (ref >60)
GFR calc non Af Amer: 33 mL/min — ABNORMAL LOW (ref >60)
GFR calc non Af Amer: 33 mL/min — ABNORMAL LOW (ref >60)
GLUCOSE: 114 mg/dL — AB (ref 70–99)
Glucose, Bld: 93 mg/dL (ref 70–99)
Potassium: 3.2 mmol/L — ABNORMAL LOW (ref 3.5–5.1)
Potassium: 3.6 mmol/L (ref 3.5–5.1)
Sodium: 150 mmol/L — ABNORMAL HIGH (ref 135–145)
Sodium: 148 mmol/L — ABNORMAL HIGH (ref 135–145)

## 2018-10-28 LAB — MAGNESIUM: Magnesium: 1.8 mg/dL (ref 1.7–2.4)

## 2018-10-28 MED ORDER — DILTIAZEM HCL 25 MG/5ML IV SOLN
10.0000 mg | Freq: Once | INTRAVENOUS | Status: AC
  Administered 2018-10-28: 10 mg via INTRAVENOUS
  Filled 2018-10-28: qty 5

## 2018-10-28 MED ORDER — MAGNESIUM SULFATE 4 GM/100ML IV SOLN
4.0000 g | INTRAVENOUS | Status: AC
  Administered 2018-10-28: 4 g via INTRAVENOUS
  Filled 2018-10-28: qty 100

## 2018-10-28 MED ORDER — POTASSIUM CHLORIDE CRYS ER 20 MEQ PO TBCR
40.0000 meq | EXTENDED_RELEASE_TABLET | Freq: Once | ORAL | Status: AC
  Administered 2018-10-28: 40 meq via ORAL
  Filled 2018-10-28: qty 2

## 2018-10-28 NOTE — Progress Notes (Signed)
Patient ID: Abigail [name], female   DOB: 06/15/1946, 73 y.o.   MRN: [id number]       Subjective: Patient with no new complaints.  Ate oatmeal this morning.  States her pain continues to improve, but she still has some left-sided tenderness.  +BM, no blood present  Objective: Vital signs in last 24 hours: Temp:  [98.3 F (36.8 C)-98.9 F (37.2 C)] 98.7 F (37.1 C) (01/09 1013) Pulse Rate:  [52-140] 122 (01/09 1013) Resp:  [12-22] 18 (01/09 1013) BP: (118-153)/(62-92) 118/92 (01/09 1013) SpO2:  [93 %-100 %] 99 % (01/09 1013) Weight:  [73.9 kg] 73.9 kg (01/09 0506) Last BM Date: 10/27/18  Intake/Output from previous day: 01/08 0701 - 01/09 0700 In: 1976.4 [P.O.:120; I.V.:1161.6; Blood:528; IV Piggyback:166.8] Out: -  Intake/Output this shift: No intake/output data recorded.  PE: Heart: irregular Lungs: CTAB Abd: soft, still tender on left side, but continues to decrease, +BS, ND  Lab Results:  Recent Labs    10/27/18 0610 10/28/18 0308  WBC 13.7* 13.0*  HGB 6.8* 8.9*  HCT 23.6* 29.3*  PLT 155 172   BMET Recent Labs    10/27/18 1423 10/28/18 0308  NA 150* 150*  K 3.3* 3.2*  CL 121* 119*  CO2 20* 21*  GLUCOSE 105* 93  BUN 16 16  CREATININE 1.58* 1.58*  CALCIUM 7.8* 8.5*   PT/INR No results for input(s): LABPROT, INR in the last 72 hours. CMP     Component Value Date/Time   NA 150 (H) 10/28/2018 0308   NA 144 09/17/2017 1429   K 3.2 (L) 10/28/2018 0308   K 3.1 (L) 09/17/2017 1429   CL 119 (H) 10/28/2018 0308   CO2 21 (L) 10/28/2018 0308   CO2 23 09/17/2017 1429   GLUCOSE 93 10/28/2018 0308   GLUCOSE 96 09/17/2017 1429   BUN 16 10/28/2018 0308   BUN 15.6 09/17/2017 1429   CREATININE 1.58 (H) 10/28/2018 0308   CREATININE 1.97 (H) 10/21/2018 1055   CREATININE 1.0 09/17/2017 1429   CALCIUM 8.5 (L) 10/28/2018 0308   CALCIUM 8.6 09/17/2017 1429   PROT 5.7 (L) 10/23/2018 0335   PROT 6.6 09/17/2017 1429   ALBUMIN 2.7 (L) 10/23/2018 0335   ALBUMIN 3.6  09/17/2017 1429   AST 31 10/23/2018 0335   AST 15 10/07/2018 0823   AST 14 09/17/2017 1429   ALT 11 10/23/2018 0335   ALT 7 10/07/2018 0823   ALT 11 09/17/2017 1429   ALKPHOS 48 10/23/2018 0335   ALKPHOS 93 09/17/2017 1429   BILITOT 1.1 10/23/2018 0335   BILITOT 1.1 10/07/2018 0823   BILITOT 1.22 (H) 09/17/2017 1429   GFRNONAA 33 (L) 10/28/2018 0308   GFRNONAA 25 (L) 10/21/2018 1055   GFRAA 38 (L) 10/28/2018 0308   GFRAA 29 (L) 10/21/2018 1055   Lipase     Component Value Date/Time   LIPASE 36 10/22/2018 0815       Studies/Results: [place] Ekg Site Rite  Result Date: 10/27/2018 If Site Rite image not attached, placement could not be confirmed due to current cardiac rhythm.   Anti-infectives: Anti-infectives (From admission, onward)   Start     Dose/Rate Route Frequency Ordered Stop   10/26/18 1000  fluconazole (DIFLUCAN) tablet 100 mg     100 mg Oral Daily 10/26/18 0743 10/30/18 0959   10/25/18 1230  piperacillin-tazobactam (ZOSYN) IVPB 3.375 g     3.375 g 12.5 mL/hr over 240 Minutes Intravenous Every 8 hours 10/25/18 1202  10/24/18 1000  fluconazole (DIFLUCAN) IVPB 100 mg  Status:  Discontinued     100 mg 50 mL/hr over 60 Minutes Intravenous Every 24 hours 10/24/18 0740 10/26/18 0743   10/22/18 2200  piperacillin-tazobactam (ZOSYN) IVPB 2.25 g  Status:  Discontinued     2.25 g 100 mL/hr over 30 Minutes Intravenous Every 8 hours 10/22/18 1241 10/25/18 1202   10/22/18 2200  acyclovir (ZOVIRAX) tablet 400 mg     400 mg Oral 2 times daily 10/22/18 1321     10/22/18 1545  vancomycin (VANCOCIN) 50 mg/mL oral solution 125 mg  Status:  Discontinued     125 mg Oral 4 times daily 10/22/18 1334 10/24/18 0738   10/22/18 1239  vancomycin variable dose per unstable renal function (pharmacist dosing)  Status:  Discontinued      Does not apply See admin instructions 10/22/18 1241 10/22/18 1321   10/22/18 0830  vancomycin (VANCOCIN) IVPB 1000 mg/200 mL premix     1,000 mg 200 mL/hr  over 60 Minutes Intravenous  Once 10/22/18 0828 10/22/18 1101   10/22/18 0830  ceFEPIme (MAXIPIME) 2 g in sodium chloride 0.9 % 100 mL IVPB     2 g 200 mL/hr over 30 Minutes Intravenous  Once 10/22/18 0828 10/22/18 0917       Assessment/Plan Colitis in the setting of active chemo treatment -WBC stable at 13K today -on soft diet and tolerating this. -cont abx therapy. No surgical plans or indications at this time  Anemia -hgb 8.9 today  FEN -soft diet VTE -Lovenox ID -[email or web address]   LOS: 6 days    Henreitta Cea , [facility] 10/28/2018, 11:49 AM Pager: [phone]

## 2018-10-28 NOTE — Progress Notes (Signed)
Tele monitoring reports ST. Patient assessed, asleep, easily aroused. Denied pain, palpitations, and/or shortness of breath. Patient's history includes tachy/brady syndrome. Patient frequently flips from [place] to [place]. Currently asymptomatic. Will report to oncoming nurse.

## 2018-10-28 NOTE — Progress Notes (Signed)
Unit of PRBC's completed without signs/symptoms of adverse reaction.

## 2018-10-28 NOTE — Significant Event (Signed)
Rapid Response Event Note  Overview: Time Called: 0914 Arrival Time: 0920 Event Type: Cardiac Notified by bedside RN, Andee Poles, in regards to patient's MEWS score of 3. MEWS score elevated due to tachycardia (HR 140s-160s). Danielle RN notified MD Grandville Silos, EKG in progress. Patient does have history of paroxysmal atrial tachycardia.   Initial Focused Assessment: Neuro: Patient slightly drowsy, alert and oriented x 4. Patient able to follow commands with all extremities, patient appears to have generalized weakness.  Cardiac: EKG showed A-fib RVR, patient asymptomatic at this time, HR 130s-160s, patient does not have any chest pain at this time as well. Irregular heart rhythm upon auscultation.  Respiratory: RA, no apparent respiratory distress. Breath sounds clear and diminished upon auscultation.   Interventions: Patient is receiving Amiodarone PO tablet 100mg  and cardizem tablet 30mg .  MD Grandville Silos notified in regards to the A-fib RVR.  IV cardizem 10mg  one time push given  Plan of Care (if not transferred): Will possibly transfer if oral medications and cardizem IV push does not help with afib RVR. Call Rapid Response if patient's afib RVR becomes worse, patient becomes asymptomatic, or patient' clinical status deteriorates.         Abigail [name] C

## 2018-10-28 NOTE — Progress Notes (Signed)
PROGRESS NOTE    Abigail [name]  WCH:[id number] DOB: August 15, 1946 DOA: 10/22/2018 PCP: Nolene Ebbs, MD    Brief Narrative:  Abigail [name] is an 73 y.o. female past medical history of multiple myeloma on Cytoxan, chronic kidney disease with a baseline creatinine 1.2, history of DVT A. fib, for which her Eliquis was recently stopped comes in complaining of worsening abdominal pain for 2 weeks. CT of the abdomen was obtained that showed left-sided colitis, critical care initially saw the patient and the patient decline aggressive intervention intubation and pressors   Assessment & Plan:   Principal Problem:   Sepsis ([place]) Active Problems:   Coronary atherosclerosis of native coronary artery   Protein-calorie malnutrition, severe   Tachycardia-bradycardia ([place])   Essential hypertension   Multiple myeloma in relapse ([place])   Acute kidney injury superimposed on chronic kidney disease ([place])   Colitis   Bradycardia   AF (paroxysmal atrial fibrillation) ([place])   Septic shock ([place])   Hypotension  Septic shock due to severe infectious colitis: Improving clinically.  Patient afebrile.  Leukocytosis trending down.  Patient tolerating full liquid diet and diet has been advanced to a soft diet.  Patient with nausea this morning.  General surgery ff.  Patient having bowel movements.  I/Os not correctly reported.  Continue IV Zosyn.  General surgery following and appreciate their input and recommendations.  Acute kidney injury: Baseline creatinine 1.3-1.6.    Creatinine trending down and currently around baseline at 1.58.  Patient showing poor oral intake at this time.  Patient hyponatremic and currently on D5W.  Follow.    Non-anion gap metabolic acidosis: Likely due to renal dysfunction.  Improving with bicarbonate tablets.  Follow.  Bradycardia: Patient was in A. fib with RVR the morning of 10/28/2018 with heart rates as high as the 160s.  Patient given IV Cardizem 10 mg x 1 as well as home regimen  of amiodarone 100 mg daily and diltiazem.  Heart rate improved and now in the 50s.  Follow closely.   Prolonged QT: Amiodarone has been resumed.  Repeat EKG with resolution of QT prolongation.  Follow.  SVT: Keep mag greater than 2 potassium greater than 4.  Multiple myeloma: Continue to hold Cytoxan.  Continue acyclovir.  Outpatient follow-up with oncology/hematology.    Anemia of chronic disease: Hemoglobin dropped to 6.8 the morning of 10/27/2018, likely dilutional component.  Patient denies any overt bleeding.  Patient transfused approximately 2 units of packed red blood cells hemoglobin currently at 8.9.  Follow H&H.  Transfusion threshold hemoglobin less than 8.  Anemia panel consistent with anemia of chronic disease.  Permanent atrial fibrillation with RVR: -Patient noted to be in A. fib with RVR this morning with heart rates ranging as high as the 160s.  Cardizem 10 mg IV push given.  Heart rate improved and in the low 50s.  Continue home regimen of amiodarone and Cardizem.  Not on anticoagulation, her cardiologist held on admission 10/19/2018 due to nausea and a drop in hemoglobin.   - Follow-up with cardiology as an outpatient to dictate whether to resume anticoagulation. -If patient goes back into A. fib with RVR and heart rate unable to be controlled will need to be placed on a Cardizem drip and patient's cardiologist, Dr. Doylene Canard will need to be consulted for further evaluation and management.    Elevated cardiac biomarkers: In the setting of demand ischemia she denies any chest pain or shortness of breath EKG showed no signs of ischemia.  Coronary atherosclerosis of native coronary artery Asymptomatic.  Continue statin.   Protein-calorie malnutrition, severe Once tolerating oral intake with clinical improvement, will place on nutritional supplementation of Ensure.   Oral thrush: Continue oral Diflucan.  Repeat EKG with resolution of QT prolongation.  Continue current  diet.  Goals of care: Patient would like to be full code.  New mild hyperkalemia: Likely due to kidney injury as her creatinine and her bicarb normalized.  Patient now hypokalemic with potassium of 3.2.  Follow.    Hypokalemia Replete potassium and monitor renal function closely.  Repeat BMET this afternoon.  Follow.  Hypernatremia Likely secondary to volume depletion.  Continue D5W.  Follow.    DVT prophylaxis: Lovenox Code Status: Full Family Communication: Updated patient.  No family at bedside. Disposition Plan: To be determined.   Consultants:   General surgery: Dr. 10/22/2018  Curbside oncology: Dr. Benay Spice  Procedures:   CT abdomen and pelvis 10/22/2018  Chest x-ray 10/22/2018, 10/23/2018  Midline 10/27/2017  1-2 units PRBCs 10/27/2017  Antimicrobials:   IV vancomycin 10/22/2018>>>> 10/24/2018  IV Zosyn 10/22/2018>>>>   Subjective: Patient with complaints of nausea.  Denies any bloody bowel movements.  States abdominal pain improving daily since admission however not at baseline.  Denies any further emesis.  Decreased appetite.  Patient noted to be in A. fib with RVR with heart rates as high as the 150s to the 160s.    Objective: Vitals:   10/28/18 0030 10/28/18 0130 10/28/18 0506 10/28/18 0700  BP: (!) 148/66 (!) 150/68 (!) 153/92   Pulse: 72 74 73 (!) 140  Resp: _0 Temp: 98.4 F (36.9 C) 98.4 F (36.9 C) 98.9 F (37.2 C)   TempSrc: Oral Oral    SpO2:   100%   Weight:   73.9 kg   Height:        Intake/Output Summary (Last 24 hours) at 10/28/2018 1001 Last data filed at 10/28/2018 0520 Gross per 24 hour  Intake 1976.43 ml  Output -  Net 1976.43 ml   Filed Weights   10/22/18 1524 10/27/18 0850 10/28/18 0506  Weight: 69.9 kg 74.5 kg 73.9 kg    Examination:  General exam: Appears calm and comfortable  Respiratory system: Lungs are to auscultation bilaterally.  No wheezes, no crackles, no rhonchi.   Respiratory effort normal. Cardiovascular  system: Irregularly irregular. No JVD, murmurs, rubs, gallops or clicks. No pedal edema. Gastrointestinal system: Abdomen is soft, less tender to palpation left lower quadrant greater than right lower quadrant.  Positive bowel bowel sounds.  No rebound.  No guarding.  Central nervous system: Alert and oriented. No focal neurological deficits. Extremities: Symmetric 5 x 5 power. Skin: No rashes, lesions or ulcers Psychiatry: Judgement and insight appear normal. Mood & affect appropriate.     Data Reviewed: I have personally reviewed following labs and imaging studies  CBC: Recent Labs  Lab 10/21/18 1055 10/22/18 0815  10/24/18 0841 10/25/18 0619 10/26/18 0643 10/27/18 0610 10/28/18 0308  WBC 4.2 13.7*   < > 14.8* 21.6* 18.6* 13.7* 13.0*  NEUTROABS 2.9 12.0*  --  9.5*  --   --  10.2*  --   HGB 9.1* 10.0*   < > 8.3* 7.7* 7.7* 6.8* 8.9*  HCT 30.2* 33.9*   < > 26.5* 24.6* 26.2* 23.6* 29.3*  MCV 97.7 98.0   < > 95.7 95.7 100.0 103.5* 94.8  PLT 271 323   < > 187 185 195 155 172   < > =  values in this interval not displayed.   Basic Metabolic Panel: Recent Labs  Lab 10/22/18 0815  10/23/18 0730  10/25/18 0619 10/26/18 0328 10/27/18 0610 10/27/18 1423 10/28/18 0308  NA 136   < >  --    < > 149* 149* 149* 150* 150*  K 3.5   < >  --    < > 4.6 5.2* 2.9* 3.3* 3.2*  CL 105   < >  --    < > 128* 124* 124* 121* 119*  CO2 18*   < >  --    < > 10* 16* 18* 20* 21*  GLUCOSE 137*   < >  --    < > 70 75 82 105* 93  BUN 27*   < >  --    < > 28* _0 CREATININE 3.20*   < >  --    < > 2.00* 1.82* 1.57* 1.58* 1.58*  CALCIUM 8.9   < >  --    < > 6.7* 7.3* 7.7* 7.8* 8.5*  MG 1.7  --  2.3  --   --   --  1.9  --  1.8   < > = values in this interval not displayed.   GFR: Estimated Creatinine Clearance: 32.9 mL/min (A) (by C-G formula based on SCr of 1.58 mg/dL (H)). Liver Function Tests: Recent Labs  Lab 10/22/18 0815 10/23/18 0335  AST 26 31  ALT 11 11  ALKPHOS 60 48  BILITOT  0.9 1.1  PROT 7.7 5.7*  ALBUMIN 4.0 2.7*   Recent Labs  Lab 10/22/18 0815  LIPASE 36   No results for input(s): AMMONIA in the last 168 hours. Coagulation Profile: Recent Labs  Lab 10/23/18 0335  INR 1.37   Cardiac Enzymes: Recent Labs  Lab 10/22/18 1553 10/22/18 1944  TROPONINI 0.11* 0.18*   BNP (last 3 results) No results for input(s): PROBNP in the last 8760 hours. HbA1C: No results for input(s): HGBA1C in the last 72 hours. CBG: Recent Labs  Lab 10/24/18 1352 10/24/18 1714  GLUCAP 65* 74   Lipid Profile: No results for input(s): CHOL, HDL, LDLCALC, TRIG, CHOLHDL, LDLDIRECT in the last 72 hours. Thyroid Function Tests: No results for input(s): TSH, T4TOTAL, FREET4, T3FREE, THYROIDAB in the last 72 hours. Anemia Panel: Recent Labs    10/27/18 0610 10/27/18 1122  VITAMINB12  --  2,725*  FOLATE  --  7.5  FERRITIN  --  912*  TIBC  --  115*  IRON  --  42  RETICCTPCT 1.1  --    Sepsis Labs: Recent Labs  Lab 10/22/18 0821 10/22/18 0823 10/22/18 1242 10/22/18 1553 10/22/18 1944 10/23/18 0335 10/24/18 0725  PROCALCITON  --  1.69 2.27  --   --  14.05 8.54  LATICACIDVEN 3.23*  --  3.7* 5.4* 2.1*  --   --     Recent Results (from the past 240 hour(s))  Blood culture (routine x 2)     Status: None   Collection Time: 10/22/18  8:21 AM  Result Value Ref Range Status   Specimen Description   Final    BLOOD RIGHT ARM Performed at [facility] [address] [place], [place] [address]    Special Requests   Final    BOTTLES DRAWN AEROBIC AND ANAEROBIC Blood Culture adequate volume Performed at [facility] [address] [place], [place] [address]    Culture   Final  NO GROWTH 5 DAYS Performed at [facility], [place] [address] [place], [place] [address]    Report Status 10/27/2018 FINAL  Final  Blood culture (routine x 2)     Status: None   Collection Time: 10/22/18  8:26 AM  Result Value Ref Range  Status   Specimen Description BLOOD LEFT FOOT  Final   Special Requests   Final    BOTTLES DRAWN AEROBIC ONLY Blood Culture results may not be optimal due to an inadequate volume of blood received in culture bottles   Culture   Final    NO GROWTH 5 DAYS Performed at [facility], [place] [address] [name], [place] [address]    Report Status 10/27/2018 FINAL  Final  MRSA PCR Screening     Status: None   Collection Time: 10/22/18  3:18 PM  Result Value Ref Range Status   MRSA by PCR NEGATIVE NEGATIVE Final    Comment:        The GeneXpert MRSA Assay (FDA approved for NASAL specimens only), is one component of a comprehensive MRSA colonization surveillance program. It is not intended to diagnose MRSA infection nor to guide or monitor treatment for MRSA infections. Performed at [facility], [place] [address] [place], [place] [address]          Radiology Studies: [place] Ekg Site Rite  Result Date: 10/27/2018 If [facility] image not attached, placement could not be confirmed due to current cardiac rhythm.       Scheduled Meds: . sodium chloride   Intravenous Once  . acyclovir  400 mg Oral BID  . amiodarone  100 mg Oral Daily  . atorvastatin  40 mg Oral q1800  . calcium carbonate  1 tablet Oral Q breakfast  . diltiazem  10 mg Intravenous Once  . diltiazem  30 mg Oral Q12H  . enoxaparin (LOVENOX) injection  40 mg Subcutaneous Q24H  . feeding supplement (ENSURE ENLIVE)  237 mL Oral TID BM  . ferrous sulfate  325 mg Oral Q breakfast  . fluconazole  100 mg Oral Daily  . pantoprazole  40 mg Oral BID AC  . pyridostigmine  30 mg Oral BID  . sodium chloride flush  10-40 mL Intracatheter Q12H   Continuous Infusions: . sodium chloride Stopped (10/27/18 1343)  . dextrose 75 mL/hr at 10/28/18 0120  . magnesium sulfate 1 - 4 g bolus IVPB    . piperacillin-tazobactam (ZOSYN)  IV 3.375 g (10/28/18 0942)     LOS: 6 days    Time spent: 40  minutes    Irine Seal, MD [facility]  If 7PM-7AM, please contact night-coverage [email or web address] Password [email or web address] 10/28/2018, 10:01 AM

## 2018-10-28 NOTE — Progress Notes (Signed)
Tele monitoring reported 14bt run Vtach nonsustained at 2028. Voiced that this has been a frequent occurrence since admission. Patient resting quietly without complaints or concerns. Denied pain and/or shortness of breath. Will monitor.

## 2018-10-28 NOTE — Progress Notes (Signed)
Physical Therapy Treatment Patient Details Name: Abigail [name] MRN: [id number] DOB: 10-Dec-1945 Today's Date: 10/28/2018    History of Present Illness 72 y.o. female past medical history of multiple myeloma on Cytoxan, chronic kidney disease admitted with sepsis d/t severe colitis    PT Comments    Pt with mod assist for all mobility this session, required +2 assist for ambulation and moving to and from [facility]. Pt limiting factors include pain which pt states is from laying in the bed, severe deconditioning, and poor endurance for activity. Pt with some difficulty with direction following, and appeared disheartened during session. PT to continue to follow acutely and will progress mobility as able.   HR: 58-85 bpm during mobility    Follow Up Recommendations  SNF     Equipment Recommendations  None recommended by PT    Recommendations for Other Services       Precautions / Restrictions Precautions Precautions: Fall Restrictions Weight Bearing Restrictions: No    Mobility  Bed Mobility Overal bed mobility: Needs Assistance Bed Mobility: Rolling;Supine to Sit Rolling: Min assist   Supine to sit: Min assist     General bed mobility comments: Min assist for completion of rolling for pericare in bed due to urinary incotinence. Min assist for supine to sit for trunk elevation and scooting to EOB. Pt with very increased time.   Transfers Overall transfer level: Needs assistance Equipment used: Rolling walker (2 wheeled) Transfers: Sit to/from [facility] Sit to Stand: Mod assist Stand pivot transfers: Mod assist;+2 safety/equipment       General transfer comment: Assist for power up, steadying upon standing. Pt with BM once standing, PT aide brought BSC next to bed. Verbal and tactile cues to direct pt to [facility], assist for slow eccentric lowering to BSC.   Ambulation/Gait Ambulation/Gait assistance: Mod assist;+2 physical assistance;+2 safety/equipment Gait  Distance (Feet): 4 Feet Assistive device: Rolling walker (2 wheeled) Gait Pattern/deviations: Shuffle;Decreased stride length;Step-through pattern;Trunk flexed Gait velocity: decr    General Gait Details: Slow and unsteady gait from [facility] to recliner. Pt with LOB and LE buckling, supported back to upright with PT assist. Assist to lower to chair. HR between 58-85 bpm during mobility.     Stairs             Wheelchair Mobility    Modified Rankin (Stroke Patients Only)       Balance Overall balance assessment: Needs assistance Sitting-balance support: Feet supported;No upper extremity supported Sitting balance-Leahy Scale: Fair Sitting balance - Comments: posterior leaning without feet grounded    Standing balance support: During functional activity Standing balance-Leahy Scale: Poor                              Cognition Arousal/Alertness: Awake/alert Behavior During Therapy: WFL for tasks assessed/performed Overall Cognitive Status: Impaired/Different from baseline Area of Impairment: Following commands                       Following Commands: Follows multi-step commands inconsistently;Follows one step commands with increased time       General Comments: difficult to understand at times      Exercises      General Comments        Pertinent Vitals/Pain Pain Assessment: Faces Faces Pain Scale: Hurts even more Pain Location: generalized, "my whole body"  Pain Descriptors / Indicators: Sore Pain Intervention(s): Limited activity within patient's tolerance;Repositioned;Monitored during session;Patient requesting  pain meds-RN notified    Home Living                      Prior Function            PT Goals (current goals can now be found in the care plan section) Acute Rehab PT Goals Patient Stated Goal: to feel better PT Goal Formulation: With patient Time For Goal Achievement: 11/08/18 Potential to Achieve Goals:  Fair Progress towards PT goals: Progressing toward goals    Frequency    Min 2X/week      PT Plan Current plan remains appropriate    Co-evaluation              AM-PAC PT "6 Clicks" Mobility   Outcome Measure  Help needed turning from your back to your side while in a flat bed without using bedrails?: A Lot Help needed moving from lying on your back to sitting on the side of a flat bed without using bedrails?: A Lot Help needed moving to and from a bed to a chair (including a wheelchair)?: A Lot Help needed standing up from a chair using your arms (e.g., wheelchair or bedside chair)?: A Lot Help needed to walk in hospital room?: A Lot Help needed climbing 3-5 steps with a railing? : Total 6 Click Score: 11    End of Session Equipment Utilized During Treatment: Gait belt Activity Tolerance: Patient limited by fatigue;Patient limited by pain Patient left: with call bell/phone within reach;with chair alarm set;in chair Nurse Communication: Mobility status PT Visit Diagnosis: Muscle weakness (generalized) (M62.81);Difficulty in walking, not elsewhere classified (R26.2)     Time: 7262-0355 PT Time Calculation (min) (ACUTE ONLY): 37 min  Charges:  $Therapeutic Activity: 23-37 mins                     Julien Girt, PT Acute Rehabilitation Services Pager [phone]  Office [phone]  Roxine Caddy D Elonda Husky 10/28/2018, 6:49 PM

## 2018-10-28 NOTE — Progress Notes (Signed)
Pharmacy Antibiotic Note  Abigail [name] is a 73 y.o. female admitted on 10/22/2018 with sepsis d/t severe colitis.  Pharmacy has been consulted for piperacillin/tazobactam dosing.  This is day #6 of IV piperacillin/tazobactam.  Pt also on day 5 of 7 of fluconazole for thrush.   Today, 10/28/18  WBC 13 - trending down  SCr 1.6 - trended down - close to baseline, CrCl ~ 33 mL/min  Afebrile  Plan:  Continue piperacillin/tazobactam 3.375 g IV q8h EI  Pharmacy to sign off, please re-consult if needed.   Height: 5\' 5"  (165.1 cm) Weight: 163 lb (73.9 kg) IBW/kg (Calculated) : 57  Temp (24hrs), Avg:98.5 F (36.9 C), Min:98.3 F (36.8 C), Max:98.9 F (37.2 C)  Recent Labs  Lab 10/22/18 0821 10/22/18 1242 10/22/18 1553 10/22/18 1944  10/24/18 0841 10/25/18 0619 10/26/18 0328 10/26/18 0643 10/27/18 0610 10/27/18 1423 10/28/18 0308  WBC  --   --   --   --    < > 14.8* 21.6*  --  18.6* 13.7*  --  13.0*  CREATININE 3.20*  --   --   --    < >  --  2.00* 1.82*  --  1.57* 1.58* 1.58*  LATICACIDVEN 3.23* 3.7* 5.4* 2.1*  --   --   --   --   --   --   --   --    < > = values in this interval not displayed.    Estimated Creatinine Clearance: 32.9 mL/min (A) (by C-G formula based on SCr of 1.58 mg/dL (H)).    Allergies  Allergen Reactions  . Sulfa Antibiotics Rash    Antimicrobials this admission: PTA Acyclovir >>  1/3  Vancomycin & Cefepime x1 in ED  1/3 >> PO Vancomycin >> 1/5 1/3 >> Zosyn >> 1/5 Fluconazole >> (1/11)  Dose adjustments this admission: 1/5 Zosyn 2.25gm q8 >> q6hr 1/6 Change to Zosyn 3.375g q8h extended infusion  Microbiology results: 1/3 BCx: ngf 1/3 MRSA PCR: negative  Thank you for allowing pharmacy to be a part of this patient's care.  Lenis Noon, PharmD 10/28/18 8:02 AM

## 2018-10-28 NOTE — Progress Notes (Signed)
LCSW met with patient at bedside.   Patient would like to return home.   Patient reports she lives with her sister and her sister helps her.   Carolin Coy [place]

## 2018-10-29 LAB — CBC WITH DIFFERENTIAL/PLATELET
Abs Immature Granulocytes: 1.44 10*3/uL — ABNORMAL HIGH (ref 0.00–0.07)
Basophils Absolute: 0 10*3/uL (ref 0.0–0.1)
Basophils Relative: 0 %
EOS ABS: 0.1 10*3/uL (ref 0.0–0.5)
EOS PCT: 1 %
HEMATOCRIT: 26.7 % — AB (ref 36.0–46.0)
Hemoglobin: 8.2 g/dL — ABNORMAL LOW (ref 12.0–15.0)
Immature Granulocytes: 12 %
Lymphocytes Relative: 7 %
Lymphs Abs: 0.9 10*3/uL (ref 0.7–4.0)
MCH: 30.1 pg (ref 26.0–34.0)
MCHC: 30.7 g/dL (ref 30.0–36.0)
MCV: 98.2 fL (ref 80.0–100.0)
Monocytes Absolute: 2 10*3/uL — ABNORMAL HIGH (ref 0.1–1.0)
Monocytes Relative: 17 %
Neutro Abs: 7.7 10*3/uL (ref 1.7–7.7)
Neutrophils Relative %: 63 %
Platelets: 148 10*3/uL — ABNORMAL LOW (ref 150–400)
RBC: 2.72 MIL/uL — ABNORMAL LOW (ref 3.87–5.11)
RDW: 21.4 % — ABNORMAL HIGH (ref 11.5–15.5)
WBC: 12.1 10*3/uL — ABNORMAL HIGH (ref 4.0–10.5)
nRBC: 0.7 % — ABNORMAL HIGH (ref 0.0–0.2)

## 2018-10-29 LAB — BASIC METABOLIC PANEL
ANION GAP: 6 (ref 5–15)
BUN: 12 mg/dL (ref 8–23)
CALCIUM: 8.1 mg/dL — AB (ref 8.9–10.3)
CO2: 21 mmol/L — ABNORMAL LOW (ref 22–32)
Chloride: 115 mmol/L — ABNORMAL HIGH (ref 98–111)
Creatinine, Ser: 1.52 mg/dL — ABNORMAL HIGH (ref 0.44–1.00)
GFR calc Af Amer: 40 mL/min — ABNORMAL LOW (ref >60)
GFR calc non Af Amer: 34 mL/min — ABNORMAL LOW (ref >60)
Glucose, Bld: 107 mg/dL — ABNORMAL HIGH (ref 70–99)
Potassium: 3.2 mmol/L — ABNORMAL LOW (ref 3.5–5.1)
Sodium: 142 mmol/L (ref 135–145)

## 2018-10-29 LAB — MAGNESIUM: Magnesium: 2.5 mg/dL — ABNORMAL HIGH (ref 1.7–2.4)

## 2018-10-29 MED ORDER — DEXTROSE-NACL 5-0.45 % IV SOLN
INTRAVENOUS | Status: DC
  Administered 2018-10-29: 09:00:00 via INTRAVENOUS

## 2018-10-29 MED ORDER — POTASSIUM CHLORIDE CRYS ER 20 MEQ PO TBCR
40.0000 meq | EXTENDED_RELEASE_TABLET | Freq: Once | ORAL | Status: AC
  Administered 2018-10-29: 40 meq via ORAL
  Filled 2018-10-29: qty 2

## 2018-10-29 MED ORDER — SODIUM CHLORIDE 0.9% FLUSH: 10.0000 mL | INTRAVENOUS | Status: DC | PRN

## 2018-10-29 MED ORDER — POTASSIUM CHLORIDE CRYS ER 20 MEQ PO TBCR
20.0000 meq | EXTENDED_RELEASE_TABLET | Freq: Every day | ORAL | Status: DC
  Administered 2018-10-30 – 2018-11-02 (×4): 20 meq via ORAL
  Filled 2018-10-29 (×4): qty 1

## 2018-10-29 NOTE — Clinical Social Work Note (Signed)
Clinical Social Work Assessment  Patient Details  Name: KAYIN KETTERING MRN: [id number] Date of Birth: [birth year]  Date of referral:  10/29/18               Reason for consult:  Facility Placement                Permission sought to share information with:  Family Supports, Case Manager Permission granted to share information::  Yes, Verbal Permission Granted  Name::     Engineer, manufacturing systems::     Relationship::  Sister  Sport and exercise psychologist Information:     Housing/Transportation Living arrangements for the past 2 months:  Single Family Home Source of Information:  Patient Patient Interpreter Needed:  None Criminal Activity/Legal Involvement Pertinent to Current Situation/Hospitalization:  No - Comment as needed Significant Relationships:  Siblings Lives with:  Siblings Do you feel safe going back to the place where you live?  Yes Need for family participation in patient care:  Yes (Comment)  Care giving concerns:  KYLINN SHROPSHIRE is a 73 y.o. F with hx MM on Cytoxan, CKD baseline Cr 1.2, hx DVT and Afib recently stopped Eliquis and CAD who presents with worsening abdominal pain for 2 weeks, now severe weakness.   Social Worker assessment / plan:  LCSW consulted for SNF placement.   LCSW met with patient at bedside. LCSW discussed PT recommendation for  SNF. Patient declines SNF at the time of assessment.   Patient reports that she lives with her sister who assist her. Patient reports that she is independent in her ADLs, however she is not cooking or using the stove or knives. Patient reports that she ambulates independently. Patient states there are 3 steps entering her home that she is able to ambulate them without any issues.   PLAN: Patient will go home with home health.   Employment status:  Retired Nurse, adult PT Recommendations:  [place] / Referral to community resources:     Patient/Family's Response to care:  Patient is thankful for  LCSW visit.   Patient/Family's Understanding of and Emotional Response to Diagnosis, Current Treatment, and Prognosis:  LCSW unable to access patients understanding. Patient appears to be in pain and answers are short. Patient declines SNF. Patient reports that her sister is able to help her.   Emotional Assessment Appearance:  Appears stated age Attitude/Demeanor/Rapport:  Lethargic Affect (typically observed):  Appropriate Orientation:  Oriented to Self, Oriented to Place, Oriented to  Time, Oriented to Situation Alcohol / Substance use:  Not Applicable Psych involvement (Current and /or in the community):  No (Comment)  Discharge Needs  Concerns to be addressed:  No discharge needs identified Readmission within the last 30 days:  Yes Current discharge risk:  None Barriers to Discharge:  Continued Medical Work up   [employer], LCSW 10/29/2018, 9:32 AM

## 2018-10-29 NOTE — Progress Notes (Signed)
PROGRESS NOTE    Abigail [name]  VEH:[id number] DOB: [birth year] DOA: 10/22/2018 PCP: Nolene Ebbs, MD    Brief Narrative:  Abigail HATA is an 73 y.o. female past medical history of multiple myeloma on Cytoxan, chronic kidney disease with a baseline creatinine 1.2, history of DVT A. fib, for which her Eliquis was recently stopped comes in complaining of worsening abdominal pain for 2 weeks. CT of the abdomen was obtained that showed left-sided colitis, critical care initially saw the patient and the patient decline aggressive intervention intubation and pressors   Assessment & Plan:   Principal Problem:   Sepsis ([place]) Active Problems:   Coronary atherosclerosis of native coronary artery   Protein-calorie malnutrition, severe   Tachycardia-bradycardia ([place])   Essential hypertension   Multiple myeloma in relapse ([place])   Acute kidney injury superimposed on chronic kidney disease ([place])   Colitis   Bradycardia   AF (paroxysmal atrial fibrillation) ([place])   Septic shock ([place])   Hypotension  Septic shock due to severe infectious colitis: Improving clinically.  Patient afebrile.  Leukocytosis trending down.  Patient tolerating full liquid diet and diet has been advanced to a soft diet.  Patient with nausea this morning.  General surgery ff.  Patient having bowel movements.  I/Os not correctly reported.  Continue IV Zosyn antibiotic day #7.  Likely discontinue Zosyn tomorrow if continued improvement with leukocytosis and clinically.  General surgery following and appreciate their input and recommendations.  Acute kidney injury: Baseline creatinine 1.3-1.6.    Creatinine trending down and currently around baseline at 1.52.  Patient showing poor oral intake at this time.  Saline lock IV fluids.  Follow.   Non-anion gap metabolic acidosis: Likely due to renal dysfunction.  Improved on bicarb tablets. Follow.  Bradycardia: Patient was in A. fib with RVR the morning of 10/28/2018 with heart rates  as high as the 160s.  Patient given IV Cardizem 10 mg x 1 as well as home regimen of amiodarone 100 mg daily and diltiazem.  Heart rate improved and now in the 50s.  Follow closely.   Prolonged QT: Amiodarone has been resumed.  Repeat EKG with resolution of QT prolongation.  Follow.  SVT: Keep mag greater than 2 potassium greater than 4.  Heart rate improved.  Continue current regimen of amiodarone and Cardizem.  Multiple myeloma: Continue to hold Cytoxan.  Continue acyclovir.  Outpatient follow-up with oncology/hematology.    Anemia of chronic disease: Hemoglobin dropped to 6.8 the morning of 10/27/2018, likely dilutional component.  Patient denies any overt bleeding.  Patient transfused approximately 2 units of packed red blood cells hemoglobin currently at 8.2.  Follow H&H.  Transfusion threshold hemoglobin less than 8.  Anemia panel consistent with anemia of chronic disease.  Permanent atrial fibrillation with RVR: -Patient noted to be in A. fib with RVR the morning of 10/28/2018 with heart rates ranging as high as the 160s.  Cardizem 10 mg IV push given.  Heart rate improved and in the 50s.  Continue home regimen of amiodarone and Cardizem.  Not on anticoagulation, her cardiologist held on admission 10/19/2018 due to nausea and a drop in hemoglobin.   - Follow-up with cardiology as an outpatient to dictate whether to resume anticoagulation.    Elevated cardiac biomarkers: In the setting of demand ischemia she denies any chest pain or shortness of breath EKG showed no signs of ischemia.  Coronary atherosclerosis of native coronary artery Asymptomatic.  Continue statin.   Protein-calorie malnutrition, severe Once  tolerating oral intake with clinical improvement, will place on nutritional supplementation of Ensure.   Oral thrush: Continue oral Diflucan.  Repeat EKG with resolution of QT prolongation.  Continue current diet.  Goals of care: Patient would like to be full  code.  New mild hyperkalemia: Likely due to kidney injury as her creatinine and her bicarb normalized.  Patient now hypokalemic with potassium of 3.2.  Follow.    Hypokalemia Replete potassium and monitor renal function closely.  Will resume home regimen of oral potassium supplementation tomorrow.   Hypernatremia Likely secondary to volume depletion.  Improved with D5W.  Saline lock IV fluids.  Follow.     DVT prophylaxis: Lovenox Code Status: Full Family Communication: Updated patient.  No family at bedside. Disposition Plan: Home with home health versus skilled nursing facility when clinically improved and medically stable and when okay with general surgery.   Consultants:   General surgery: Dr. 10/22/2018  Curbside oncology: Dr. Benay Spice  Procedures:   CT abdomen and pelvis 10/22/2018  Chest x-ray 10/22/2018, 10/23/2018  Midline 10/27/2017  1-2 units PRBCs 10/27/2017  Antimicrobials:   IV vancomycin 10/22/2018>>>> 10/24/2018  IV Zosyn 10/22/2018>>>>   Subjective: Patient states some improvement with nausea.  No emesis.  Decreased appetite.  Still with abdominal pain however states is improving.  Heart rate improved.  No chest pain.  No shortness of breath.    Objective: Vitals:   10/29/18 0400 10/29/18 0633 10/29/18 0757 10/29/18 0931  BP:  139/82  131/70  Pulse: (!) 39 (!) 47 (!) 55 (!) 50  Resp:  _0 Temp:  97.6 F (36.4 C)    TempSrc:  Oral  Oral  SpO2:  100%  100%  Weight:  74.8 kg    Height:        Intake/Output Summary (Last 24 hours) at 10/29/2018 1055 Last data filed at 10/29/2018 1029 Gross per 24 hour  Intake 2546.31 ml  Output -  Net 2546.31 ml   Filed Weights   10/27/18 0850 10/28/18 0506 10/29/18 0633  Weight: 74.5 kg 73.9 kg 74.8 kg    Examination:  General exam: NAD Respiratory system: CTAB.  No wheezes, no crackles, no rhonchi.   Respiratory effort normal. Cardiovascular system: Irregularly irregular.  No JVD, no murmurs, no rubs, no  gallops.  No lower extremity edema.  Gastrointestinal system: Abdomen is with some tenderness to palpation left lower quadrant, soft, positive bowel sounds.  No rebound.  No guarding. Central nervous system: Alert and oriented. No focal neurological deficits. Extremities: Symmetric 5 x 5 power. Skin: No rashes, lesions or ulcers Psychiatry: Judgement and insight appear normal. Mood & affect appropriate.     Data Reviewed: I have personally reviewed following labs and imaging studies  CBC: Recent Labs  Lab 10/24/18 0841 10/25/18 0619 10/26/18 0643 10/27/18 0610 10/28/18 0308 10/29/18 0419  WBC 14.8* 21.6* 18.6* 13.7* 13.0* 12.1*  NEUTROABS 9.5*  --   --  10.2*  --  7.7  HGB 8.3* 7.7* 7.7* 6.8* 8.9* 8.2*  HCT 26.5* 24.6* 26.2* 23.6* 29.3* 26.7*  MCV 95.7 95.7 100.0 103.5* 94.8 98.2  PLT 187 185 195 155 172 263*   Basic Metabolic Panel: Recent Labs  Lab 10/23/18 0730  10/27/18 0610 10/27/18 1423 10/28/18 0308 10/28/18 1408 10/29/18 0419  NA  --    < > 149* 150* 150* 148* 142  K  --    < > 2.9* 3.3* 3.2* 3.6 3.2*  CL  --    < >  124* 121* 119* 119* 115*  CO2  --    < > 18* 20* 21* 23 21*  GLUCOSE  --    < > 82 105* 93 114* 107*  BUN  --    < > _0 CREATININE  --    < > 1.57* 1.58* 1.58* 1.58* 1.52*  CALCIUM  --    < > 7.7* 7.8* 8.5* 8.2* 8.1*  MG 2.3  --  1.9  --  1.8  --  2.5*   < > = values in this interval not displayed.   GFR: Estimated Creatinine Clearance: 34.4 mL/min (A) (by C-G formula based on SCr of 1.52 mg/dL (H)). Liver Function Tests: Recent Labs  Lab 10/23/18 0335  AST 31  ALT 11  ALKPHOS 48  BILITOT 1.1  PROT 5.7*  ALBUMIN 2.7*   No results for input(s): LIPASE, AMYLASE in the last 168 hours. No results for input(s): AMMONIA in the last 168 hours. Coagulation Profile: Recent Labs  Lab 10/23/18 0335  INR 1.37   Cardiac Enzymes: Recent Labs  Lab 10/22/18 1553 10/22/18 1944  TROPONINI 0.11* 0.18*   BNP (last 3 results) No  results for input(s): PROBNP in the last 8760 hours. HbA1C: No results for input(s): HGBA1C in the last 72 hours. CBG: Recent Labs  Lab 10/24/18 1352 10/24/18 1714  GLUCAP 65* 74   Lipid Profile: No results for input(s): CHOL, HDL, LDLCALC, TRIG, CHOLHDL, LDLDIRECT in the last 72 hours. Thyroid Function Tests: No results for input(s): TSH, T4TOTAL, FREET4, T3FREE, THYROIDAB in the last 72 hours. Anemia Panel: Recent Labs    10/27/18 0610 10/27/18 1122  VITAMINB12  --  2,725*  FOLATE  --  7.5  FERRITIN  --  912*  TIBC  --  115*  IRON  --  42  RETICCTPCT 1.1  --    Sepsis Labs: Recent Labs  Lab 10/22/18 1242 10/22/18 1553 10/22/18 1944 10/23/18 0335 10/24/18 0725  PROCALCITON 2.27  --   --  14.05 8.54  LATICACIDVEN 3.7* 5.4* 2.1*  --   --     Recent Results (from the past 240 hour(s))  Blood culture (routine x 2)     Status: None   Collection Time: 10/22/18  8:21 AM  Result Value Ref Range Status   Specimen Description   Final    BLOOD RIGHT ARM Performed at [facility] [address], [place], [place] [address]    Special Requests   Final    BOTTLES DRAWN AEROBIC AND ANAEROBIC Blood Culture adequate volume Performed at [facility] [address] [place], [place] [address]    Culture   Final    NO GROWTH 5 DAYS Performed at [facility], [place] [address], [place], [place] [address]    Report Status 10/27/2018 FINAL  Final  Blood culture (routine x 2)     Status: None   Collection Time: 10/22/18  8:26 AM  Result Value Ref Range Status   Specimen Description BLOOD LEFT FOOT  Final   Special Requests   Final    BOTTLES DRAWN AEROBIC ONLY Blood Culture results may not be optimal due to an inadequate volume of blood received in culture bottles   Culture   Final    NO GROWTH 5 DAYS Performed at [facility], [place] [address] [place], [place] [address]    Report Status 10/27/2018 FINAL  Final  MRSA PCR  Screening  Status: None   Collection Time: 10/22/18  3:18 PM  Result Value Ref Range Status   MRSA by PCR NEGATIVE NEGATIVE Final    Comment:        The GeneXpert MRSA Assay (FDA approved for NASAL specimens only), is one component of a comprehensive MRSA colonization surveillance program. It is not intended to diagnose MRSA infection nor to guide or monitor treatment for MRSA infections. Performed at [facility], [place] [address], [place], [place] [address]          Radiology Studies: [place] Ekg Site Rite  Result Date: 10/27/2018 If [facility] image not attached, placement could not be confirmed due to current cardiac rhythm.       Scheduled Meds: . sodium chloride   Intravenous Once  . acyclovir  400 mg Oral BID  . amiodarone  100 mg Oral Daily  . atorvastatin  40 mg Oral q1800  . calcium carbonate  1 tablet Oral Q breakfast  . diltiazem  30 mg Oral Q12H  . enoxaparin (LOVENOX) injection  40 mg Subcutaneous Q24H  . feeding supplement (ENSURE ENLIVE)  237 mL Oral TID BM  . ferrous sulfate  325 mg Oral Q breakfast  . pantoprazole  40 mg Oral BID AC  . [START ON 10/30/2018] potassium chloride  20 mEq Oral Daily  . pyridostigmine  30 mg Oral BID  . sodium chloride flush  10-40 mL Intracatheter Q12H   Continuous Infusions: . sodium chloride Stopped (10/27/18 1343)  . piperacillin-tazobactam (ZOSYN)  IV 3.375 g (10/29/18 0907)     LOS: 7 days    Time spent: 35 minutes    Irine Seal, MD [facility]  If 7PM-7AM, please contact night-coverage [email or web address] Password [facility] 10/29/2018, 10:55 AM

## 2018-10-29 NOTE — Progress Notes (Signed)
Patient ID: Abigail [name], female   DOB: January 15, 1946, 73 y.o.   MRN: [id number]       Subjective: Patient states she ate ice cream last night and pudding for lunch yesterday.  She hasn't eaten her "solid" food yet only because she states the ice cream and pudding are what sound good.  No increase in pain with eating, but does state that she felt her pain was slightly worse today with my palpation.  Objective: Vital signs in last 24 hours: Temp:  [97.6 F (36.4 C)-98.7 F (37.1 C)] 97.6 F (36.4 C) (01/10 1937) Pulse Rate:  [39-122] 50 (01/10 0931) Resp:  [11-19] 11 (01/10 0931) BP: (116-143)/(70-92) 131/70 (01/10 0931) SpO2:  [99 %-100 %] 100 % (01/10 0931) Weight:  [74.8 kg] 74.8 kg (01/10 0633) Last BM Date: 10/28/18  Intake/Output from previous day: 01/09 0701 - 01/10 0700 In: 1433.2 [P.O.:120; I.V.:1260; IV Piggyback:53.2] Out: -  Intake/Output this shift: Total I/O In: 1093.1 [I.V.:1004.8; IV Piggyback:88.3] Out: -   PE: Abd: soft, still tender on left-side, seems no worse than yesterday, but subjectively she felt it was slightly worse, +BS, ND  Lab Results:  Recent Labs    10/28/18 0308 10/29/18 0419  WBC 13.0* 12.1*  HGB 8.9* 8.2*  HCT 29.3* 26.7*  PLT 172 148*   BMET Recent Labs    10/28/18 1408 10/29/18 0419  NA 148* 142  K 3.6 3.2*  CL 119* 115*  CO2 23 21*  GLUCOSE 114* 107*  BUN 15 12  CREATININE 1.58* 1.52*  CALCIUM 8.2* 8.1*   PT/INR No results for input(s): LABPROT, INR in the last 72 hours. CMP     Component Value Date/Time   NA 142 10/29/2018 0419   NA 144 09/17/2017 1429   K 3.2 (L) 10/29/2018 0419   K 3.1 (L) 09/17/2017 1429   CL 115 (H) 10/29/2018 0419   CO2 21 (L) 10/29/2018 0419   CO2 23 09/17/2017 1429   GLUCOSE 107 (H) 10/29/2018 0419   GLUCOSE 96 09/17/2017 1429   BUN 12 10/29/2018 0419   BUN 15.6 09/17/2017 1429   CREATININE 1.52 (H) 10/29/2018 0419   CREATININE 1.97 (H) 10/21/2018 1055   CREATININE 1.0 09/17/2017 1429   CALCIUM 8.1 (L) 10/29/2018 0419   CALCIUM 8.6 09/17/2017 1429   PROT 5.7 (L) 10/23/2018 0335   PROT 6.6 09/17/2017 1429   ALBUMIN 2.7 (L) 10/23/2018 0335   ALBUMIN 3.6 09/17/2017 1429   AST 31 10/23/2018 0335   AST 15 10/07/2018 0823   AST 14 09/17/2017 1429   ALT 11 10/23/2018 0335   ALT 7 10/07/2018 0823   ALT 11 09/17/2017 1429   ALKPHOS 48 10/23/2018 0335   ALKPHOS 93 09/17/2017 1429   BILITOT 1.1 10/23/2018 0335   BILITOT 1.1 10/07/2018 0823   BILITOT 1.22 (H) 09/17/2017 1429   GFRNONAA 34 (L) 10/29/2018 0419   GFRNONAA 25 (L) 10/21/2018 1055   GFRAA 40 (L) 10/29/2018 0419   GFRAA 29 (L) 10/21/2018 1055   Lipase     Component Value Date/Time   LIPASE 36 10/22/2018 0815       Studies/Results: [place] Ekg Site Rite  Result Date: 10/27/2018 If Site Rite image not attached, placement could not be confirmed due to current cardiac rhythm.   Anti-infectives: Anti-infectives (From admission, onward)   Start     Dose/Rate Route Frequency Ordered Stop   10/26/18 1000  fluconazole (DIFLUCAN) tablet 100 mg     100 mg Oral Daily  10/26/18 0743 10/30/18 0959   10/25/18 1230  piperacillin-tazobactam (ZOSYN) IVPB 3.375 g     3.375 g 12.5 mL/hr over 240 Minutes Intravenous Every 8 hours 10/25/18 1202     10/24/18 1000  fluconazole (DIFLUCAN) IVPB 100 mg  Status:  Discontinued     100 mg 50 mL/hr over 60 Minutes Intravenous Every 24 hours 10/24/18 0740 10/26/18 0743   10/22/18 2200  piperacillin-tazobactam (ZOSYN) IVPB 2.25 g  Status:  Discontinued     2.25 g 100 mL/hr over 30 Minutes Intravenous Every 8 hours 10/22/18 1241 10/25/18 1202   10/22/18 2200  acyclovir (ZOVIRAX) tablet 400 mg     400 mg Oral 2 times daily 10/22/18 1321     10/22/18 1545  vancomycin (VANCOCIN) 50 mg/mL oral solution 125 mg  Status:  Discontinued     125 mg Oral 4 times daily 10/22/18 1334 10/24/18 0738   10/22/18 1239  vancomycin variable dose per unstable renal function (pharmacist dosing)  Status:   Discontinued      Does not apply See admin instructions 10/22/18 1241 10/22/18 1321   10/22/18 0830  vancomycin (VANCOCIN) IVPB 1000 mg/200 mL premix     1,000 mg 200 mL/hr over 60 Minutes Intravenous  Once 10/22/18 0828 10/22/18 1101   10/22/18 0830  ceFEPIme (MAXIPIME) 2 g in sodium chloride 0.9 % 100 mL IVPB     2 g 200 mL/hr over 30 Minutes Intravenous  Once 10/22/18 0828 10/22/18 0917       Assessment/Plan Colitis in the setting of active chemo treatment -WBC still down trending and down to 12 today.  She is AF and overall seems to be improving.  Unclear if this pain is just some residual tenderness as she is not septic or septic appearing. -on soft diet and tolerating this. -cont abx therapy. No surgical plans or indications at this time.  Anemia -hgb 8.2 today  FEN -soft diet VTE -Lovenox ID -[email or web address]   LOS: 7 days    Henreitta Cea , [facility] 10/29/2018, 9:54 AM Pager: [phone]

## 2018-10-29 NOTE — Care Management Note (Signed)
Case Management Note  Patient Details  Name: Abigail [name] MRN: [id number] Date of Birth: 08/23/1946  Subjective/Objective:                  Discharge Planning  Action/Plan: [facility] with [facility] pt ot aide and sw ordered/Karen Byrd with advance made aware.  Expected Discharge Date:  (unknown)               Expected Discharge Plan:  [place]  In-House Referral:     Discharge planning Services  CM Consult  Post Acute Care Choice:    Choice offered to:  Patient  DME Arranged:    DME Agency:     HH Arranged:  PT, OT, Nurse's Aide, Social Work [employer] Agency:  [place]  Status of Service:  Completed, signed off  If discussed at [employer], dates discussed:    Additional Comments:  Leeroy Cha, RN 10/29/2018, 9:49 AM

## 2018-10-30 LAB — CBC
HCT: 27.1 % — ABNORMAL LOW (ref 36.0–46.0)
HEMOGLOBIN: 8.3 g/dL — AB (ref 12.0–15.0)
MCH: 30 pg (ref 26.0–34.0)
MCHC: 30.6 g/dL (ref 30.0–36.0)
MCV: 97.8 fL (ref 80.0–100.0)
Platelets: 153 10*3/uL (ref 150–400)
RBC: 2.77 MIL/uL — ABNORMAL LOW (ref 3.87–5.11)
RDW: 20.9 % — ABNORMAL HIGH (ref 11.5–15.5)
WBC: 12 10*3/uL — ABNORMAL HIGH (ref 4.0–10.5)
nRBC: 0.6 % — ABNORMAL HIGH (ref 0.0–0.2)

## 2018-10-30 LAB — BASIC METABOLIC PANEL
Anion gap: 7 (ref 5–15)
BUN: 11 mg/dL (ref 8–23)
CO2: 22 mmol/L (ref 22–32)
Calcium: 8 mg/dL — ABNORMAL LOW (ref 8.9–10.3)
Chloride: 113 mmol/L — ABNORMAL HIGH (ref 98–111)
Creatinine, Ser: 1.47 mg/dL — ABNORMAL HIGH (ref 0.44–1.00)
GFR calc Af Amer: 41 mL/min — ABNORMAL LOW (ref >60)
GFR calc non Af Amer: 36 mL/min — ABNORMAL LOW (ref >60)
Glucose, Bld: 83 mg/dL (ref 70–99)
Potassium: 3.9 mmol/L (ref 3.5–5.1)
SODIUM: 142 mmol/L (ref 135–145)

## 2018-10-30 NOTE — Progress Notes (Signed)
Pt with very poor veins. Pt has had multiple PIV attempts by IV Team with and without [place]. Pt had midline placed which has now occluded. Abx have been discontinued, pt is only receiving IV fluids, and is to be discharged within 24-48 hrs.  Pt only needs a PIV at this time per Jackelyn Poling, Therapist, sports. B/L arms are bruised and edematous. Unable to obtain PIV. Debbie, RN to notify MD. Will need to place another midline if pt needs further IV access.  Randol Kern, RN [facility]

## 2018-10-30 NOTE — Progress Notes (Signed)
PROGRESS NOTE    Abigail [name]  FIE:[id number] DOB: 06/13/1946 DOA: 10/22/2018 PCP: Nolene Ebbs, MD    Brief Narrative:  Abigail [name] is an 73 y.o. female past medical history of multiple myeloma on Cytoxan, chronic kidney disease with a baseline creatinine 1.2, history of DVT A. fib, for which her Eliquis was recently stopped comes in complaining of worsening abdominal pain for 2 weeks. CT of the abdomen was obtained that showed left-sided colitis, critical care initially saw the patient and the patient decline aggressive intervention intubation and pressors   Assessment & Plan:   Principal Problem:   Sepsis ([place]) Active Problems:   Coronary atherosclerosis of native coronary artery   Protein-calorie malnutrition, severe   Tachycardia-bradycardia ([place])   Essential hypertension   Multiple myeloma in relapse ([place])   Acute kidney injury superimposed on chronic kidney disease ([place])   Colitis   Bradycardia   AF (paroxysmal atrial fibrillation) ([place])   Septic shock ([place])   Hypotension  Septic shock due to severe infectious colitis: Improving clinically.  Patient afebrile.  Leukocytosis trending down.  Patient tolerating full liquid diet and diet has been advanced to a soft diet.  Patient with nausea this morning.  General surgery ff.  Patient having bowel movements.  I/Os not correctly reported.  Continue IV Zosyn antibiotic day #8.  Discontinue IV Zosyn after today's dose.  General surgery following and appreciate input and recommendations.  Acute kidney injury: Baseline creatinine 1.3-1.6.    Creatinine trending down and currently around baseline at 1.47.  Patient showing poor oral intake at this time.  Saline lock IV fluids.  Follow.   Non-anion gap metabolic acidosis: Likely due to renal dysfunction.  Improved on bicarb tablets. Follow.  Bradycardia: Patient was in A. fib with RVR the morning of 10/28/2018 with heart rates as high as the 160s.  Patient given IV Cardizem 10 mg x 1  as well as home regimen of amiodarone 100 mg daily and diltiazem.  Heart rate improved and now in the 50s.  Patient asymptomatic.  Follow closely.   Prolonged QT: Amiodarone has been resumed.  Repeat EKG with resolution of QT prolongation.  Follow.  SVT: Keep mag greater than 2 potassium greater than 4.  Heart rate improved.  Continue current regimen of amiodarone and Cardizem.  Multiple myeloma: Continue to hold Cytoxan.  Continue acyclovir.  Outpatient follow-up with oncology/hematology.    Anemia of chronic disease: Hemoglobin dropped to 6.8 the morning of 10/27/2018, likely dilutional component.  Patient denies any overt bleeding.  Patient transfused approximately 2 units of packed red blood cells hemoglobin currently at 8.3.  Follow H&H.  Transfusion threshold hemoglobin less than 8.  Anemia panel consistent with anemia of chronic disease.  Permanent atrial fibrillation with RVR: -Patient noted to be in A. fib with RVR the morning of 10/28/2018 with heart rates was ranging as high as the 160s.  Cardizem 10 mg IV push given.  Heart rate improved and in the 50s.  Continue home regimen of amiodarone and Cardizem.  Not on anticoagulation, her cardiologist held on admission 10/19/2018 due to nausea and a drop in hemoglobin.   - Follow-up with cardiology as an outpatient to dictate whether to resume anticoagulation.    Elevated cardiac biomarkers: In the setting of demand ischemia she denies any chest pain or shortness of breath EKG showed no signs of ischemia.  Coronary atherosclerosis of native coronary artery Asymptomatic.  Continue statin.   Protein-calorie malnutrition, severe once tolerating oral  intake with clinical improvement, will place on nutritional supplementation of Ensure.   Oral thrush: Status post oral Diflucan.  Continue current diet.   Goals of care: Patient would like to be full code.  New mild hyperkalemia: Likely due to kidney injury as her creatinine and  her bicarb normalized.  Patient was hypokalemic which has since been resolved.  Follow.  Hypokalemia Potassium repleted.  Home regimen oral supplementation of potassium has been resumed.   Hypernatremia Likely secondary to volume depletion.  Improved with D5W.  Saline lock IV fluids.  Follow.     DVT prophylaxis: Lovenox Code Status: Full Family Communication: Updated patient.  No family at bedside. Disposition Plan: Home with home health versus skilled nursing facility when clinically improved and medically stable and when okay with general surgery.   Consultants:   General surgery: Dr. 10/22/2018  Curbside oncology: Dr. Benay Spice  Procedures:   CT abdomen and pelvis 10/22/2018  Chest x-ray 10/22/2018, 10/23/2018  Midline 10/27/2017  1-2 units PRBCs 10/27/2017  Antimicrobials:   IV vancomycin 10/22/2018>>>> 10/24/2018  IV Zosyn 10/22/2018>>>>10/30/2018   Subjective: Patient laying in bed.  States nausea and abdominal pain slowly improving.  Still with decreased appetite however trying to eat.  No chest pain.  No shortness of breath.  States sat in chair yesterday.   Objective: Vitals:   10/30/18 0300 10/30/18 0513 10/30/18 0653 10/30/18 0829  BP: (!) 146/72 (!) 145/74 (!) 158/76 (!) 151/74  Pulse: (!) 49 (!) 51 (!) 51 (!) 59  Resp:  (!) 9  16  Temp:  98.3 F (36.8 C)  98.6 F (37 C)  TempSrc:  Oral    SpO2:  99%  100%  Weight:  78.8 kg    Height:        Intake/Output Summary (Last 24 hours) at 10/30/2018 1258 Last data filed at 10/30/2018 0900 Gross per 24 hour  Intake 370.64 ml  Output -  Net 370.64 ml   Filed Weights   10/28/18 0506 10/29/18 0633 10/30/18 0513  Weight: 73.9 kg 74.8 kg 78.8 kg    Examination:  General exam: NAD Respiratory system: Lungs clear to auscultation bilaterally.  No wheezes, no crackles, no rhonchi.  Cardiovascular system: Irregularly irregular.  Bradycardia.  No JVD, no murmurs, no rubs, no gallops.  No lower extremity edema.    Gastrointestinal system: Abdomen is with decreasing tenderness to palpation left lower quadrant.  No rebound.  No guarding.   Central nervous system: Alert and oriented. No focal neurological deficits. Extremities: Symmetric 5 x 5 power. Skin: No rashes, lesions or ulcers Psychiatry: Judgement and insight appear normal. Mood & affect appropriate.     Data Reviewed: I have personally reviewed following labs and imaging studies  CBC: Recent Labs  Lab 10/24/18 0841  10/26/18 0643 10/27/18 0610 10/28/18 0308 10/29/18 0419 10/30/18 0200  WBC 14.8*   < > 18.6* 13.7* 13.0* 12.1* 12.0*  NEUTROABS 9.5*  --   --  10.2*  --  7.7  --   HGB 8.3*   < > 7.7* 6.8* 8.9* 8.2* 8.3*  HCT 26.5*   < > 26.2* 23.6* 29.3* 26.7* 27.1*  MCV 95.7   < > 100.0 103.5* 94.8 98.2 97.8  PLT 187   < > 195 155 172 148* 153   < > = values in this interval not displayed.   Basic Metabolic Panel: Recent Labs  Lab 10/27/18 0610 10/27/18 1423 10/28/18 0308 10/28/18 1408 10/29/18 0419 10/30/18 0200  NA 149* 150* 150*  148* 142 142  K 2.9* 3.3* 3.2* 3.6 3.2* 3.9  CL 124* 121* 119* 119* 115* 113*  CO2 18* 20* 21* 23 21* 22  GLUCOSE 82 105* 93 114* 107* 83  BUN '17 16 16 15 12 11  ' CREATININE 1.57* 1.58* 1.58* 1.58* 1.52* 1.47*  CALCIUM 7.7* 7.8* 8.5* 8.2* 8.1* 8.0*  MG 1.9  --  1.8  --  2.5*  --    GFR: Estimated Creatinine Clearance: 36.4 mL/min (A) (by C-G formula based on SCr of 1.47 mg/dL (H)). Liver Function Tests: No results for input(s): AST, ALT, ALKPHOS, BILITOT, PROT, ALBUMIN in the last 168 hours. No results for input(s): LIPASE, AMYLASE in the last 168 hours. No results for input(s): AMMONIA in the last 168 hours. Coagulation Profile: No results for input(s): INR, PROTIME in the last 168 hours. Cardiac Enzymes: No results for input(s): CKTOTAL, CKMB, CKMBINDEX, TROPONINI in the last 168 hours. BNP (last 3 results) No results for input(s): PROBNP in the last 8760 hours. HbA1C: No results for  input(s): HGBA1C in the last 72 hours. CBG: Recent Labs  Lab 10/24/18 1352 10/24/18 1714  GLUCAP 65* 74   Lipid Profile: No results for input(s): CHOL, HDL, LDLCALC, TRIG, CHOLHDL, LDLDIRECT in the last 72 hours. Thyroid Function Tests: No results for input(s): TSH, T4TOTAL, FREET4, T3FREE, THYROIDAB in the last 72 hours. Anemia Panel: No results for input(s): VITAMINB12, FOLATE, FERRITIN, TIBC, IRON, RETICCTPCT in the last 72 hours. Sepsis Labs: Recent Labs  Lab 10/24/18 0725  PROCALCITON 8.54    Recent Results (from the past 240 hour(s))  Blood culture (routine x 2)     Status: None   Collection Time: 10/22/18  8:21 AM  Result Value Ref Range Status   Specimen Description   Final    BLOOD RIGHT ARM Performed at [facility] [address] [place], [place] [address]    Special Requests   Final    BOTTLES DRAWN AEROBIC AND ANAEROBIC Blood Culture adequate volume Performed at [facility] [address] [place], [place] [address]    Culture   Final    NO GROWTH 5 DAYS Performed at [facility], [place] [address] [place], [place] [address]    Report Status 10/27/2018 FINAL  Final  Blood culture (routine x 2)     Status: None   Collection Time: 10/22/18  8:26 AM  Result Value Ref Range Status   Specimen Description BLOOD LEFT FOOT  Final   Special Requests   Final    BOTTLES DRAWN AEROBIC ONLY Blood Culture results may not be optimal due to an inadequate volume of blood received in culture bottles   Culture   Final    NO GROWTH 5 DAYS Performed at [facility], [place] [address] [place], [place] [address]    Report Status 10/27/2018 FINAL  Final  MRSA PCR Screening     Status: None   Collection Time: 10/22/18  3:18 PM  Result Value Ref Range Status   MRSA by PCR NEGATIVE NEGATIVE Final    Comment:        The GeneXpert MRSA Assay (FDA approved for NASAL specimens only), is one component of a comprehensive MRSA  colonization surveillance program. It is not intended to diagnose MRSA infection nor to guide or monitor treatment for MRSA infections. Performed at [facility], [place] [address], [place], [place] [address]          Radiology Studies:  No results found.      Scheduled Meds: . sodium chloride   Intravenous Once  . acyclovir  400 mg Oral BID  . amiodarone  100 mg Oral Daily  . atorvastatin  40 mg Oral q1800  . calcium carbonate  1 tablet Oral Q breakfast  . diltiazem  30 mg Oral Q12H  . feeding supplement (ENSURE ENLIVE)  237 mL Oral TID BM  . ferrous sulfate  325 mg Oral Q breakfast  . pantoprazole  40 mg Oral BID AC  . potassium chloride  20 mEq Oral Daily  . pyridostigmine  30 mg Oral BID  . sodium chloride flush  10-40 mL Intracatheter Q12H   Continuous Infusions: . sodium chloride Stopped (10/27/18 1343)  . piperacillin-tazobactam (ZOSYN)  IV 3.375 g (10/30/18 0821)     LOS: 8 days    Time spent: 35 minutes    Irine Seal, MD [facility]  If 7PM-7AM, please contact night-coverage [email or web address] Password [facility] 10/30/2018, 12:58 PM

## 2018-10-30 NOTE — Progress Notes (Addendum)
[facility]:  [phone] General Surgery Progress Note   LOS: 8 days  POD -     Chief Complaint: Abdominal pain  Assessment and Plan: 1.  Colitis  WBC - 12,000 - 10/30/2018  Zosyn - 1/3 >>>  Says that she feels better.   Needs to ambulate more.  No surgical issues - will check again on Monday, 1/13.   2.  Multiple myleoma with diffuse lytic bone lesions - on active chemotx  On cytoxan 3.  History of DVT 4.  A. Fib  Eliquis 5.  Malnutrition 6.  Anemia  Hgb - 8.3 - 10/30/2018 7.  Creatinine - 1.47 - 10/30/2018 8.  DVT prophylaxis - lovenox on hold as of 10/29/2018   Principal Problem:   Sepsis ([place]) Active Problems:   Coronary atherosclerosis of native coronary artery   Protein-calorie malnutrition, severe   Tachycardia-bradycardia ([place])   Essential hypertension   Multiple myeloma in relapse ([place])   Acute kidney injury superimposed on chronic kidney disease ([facility])   Colitis   Bradycardia   AF (paroxysmal atrial fibrillation) ([facility])   Septic shock ([facility])   Hypotension   Subjective:  Says that she is doing better.  She has not been out in the hall.  Not married.  No children.  Objective:   Vitals:   10/30/18 0653 10/30/18 0829  BP: (!) 158/76 (!) 151/74  Pulse: (!) 51 (!) 59  Resp:  16  Temp:  98.6 F (37 C)  SpO2:  100%     Intake/Output from previous day:  01/10 0701 - 01/11 0700 In: 1243.8 [I.V.:1024.8; IV Piggyback:219] Out: -   Intake/Output this shift:  No intake/output data recorded.   Physical Exam:   General: Older AA F who is alert and oriented.    HEENT: Normal. Pupils equal. .   Lungs: Clear   Abdomen: Sore/tender left abdomen.  No guarding.  Has BS.   Lab Results:    Recent Labs    10/29/18 0419 10/30/18 0200  WBC 12.1* 12.0*  HGB 8.2* 8.3*  HCT 26.7* 27.1*  PLT 148* 153    BMET   Recent Labs    10/29/18 0419 10/30/18 0200  NA 142 142  K 3.2* 3.9  CL 115* 113*  CO2 21* 22  GLUCOSE 107* 83  BUN 12 11   CREATININE 1.52* 1.47*  CALCIUM 8.1* 8.0*    PT/INR  No results for input(s): LABPROT, INR in the last 72 hours.  ABG  No results for input(s): PHART, HCO3 in the last 72 hours.  Invalid input(s): PCO2, PO2   Studies/Results:  No results found.   Anti-infectives:   Anti-infectives (From admission, onward)   Start     Dose/Rate Route Frequency Ordered Stop   10/26/18 1000  fluconazole (DIFLUCAN) tablet 100 mg     100 mg Oral Daily 10/26/18 0743 10/29/18 1008   10/25/18 1230  piperacillin-tazobactam (ZOSYN) IVPB 3.375 g     3.375 g 12.5 mL/hr over 240 Minutes Intravenous Every 8 hours 10/25/18 1202     10/24/18 1000  fluconazole (DIFLUCAN) IVPB 100 mg  Status:  Discontinued     100 mg 50 mL/hr over 60 Minutes Intravenous Every 24 hours 10/24/18 0740 10/26/18 0743   10/22/18 2200  piperacillin-tazobactam (ZOSYN) IVPB 2.25 g  Status:  Discontinued     2.25 g 100 mL/hr over 30 Minutes Intravenous Every 8 hours 10/22/18 1241 10/25/18 1202   10/22/18 2200  acyclovir (ZOVIRAX) tablet 400 mg  400 mg Oral 2 times daily 10/22/18 1321     10/22/18 1545  vancomycin (VANCOCIN) 50 mg/mL oral solution 125 mg  Status:  Discontinued     125 mg Oral 4 times daily 10/22/18 1334 10/24/18 0738   10/22/18 1239  vancomycin variable dose per unstable renal function (pharmacist dosing)  Status:  Discontinued      Does not apply See admin instructions 10/22/18 1241 10/22/18 1321   10/22/18 0830  vancomycin (VANCOCIN) IVPB 1000 mg/200 mL premix     1,000 mg 200 mL/hr over 60 Minutes Intravenous  Once 10/22/18 0828 10/22/18 1101   10/22/18 0830  ceFEPIme (MAXIPIME) 2 g in sodium chloride 0.9 % 100 mL IVPB     2 g 200 mL/hr over 30 Minutes Intravenous  Once 10/22/18 0828 10/22/18 0917      Alphonsa Overall, MD, FACS Pager: [facility]: [phone] 10/30/2018

## 2018-10-31 LAB — BASIC METABOLIC PANEL
Anion gap: 9 (ref 5–15)
BUN: 10 mg/dL (ref 8–23)
CO2: 20 mmol/L — ABNORMAL LOW (ref 22–32)
Calcium: 8.5 mg/dL — ABNORMAL LOW (ref 8.9–10.3)
Chloride: 116 mmol/L — ABNORMAL HIGH (ref 98–111)
Creatinine, Ser: 1.25 mg/dL — ABNORMAL HIGH (ref 0.44–1.00)
GFR calc Af Amer: 50 mL/min — ABNORMAL LOW (ref >60)
GFR calc non Af Amer: 43 mL/min — ABNORMAL LOW (ref >60)
GLUCOSE: 67 mg/dL — AB (ref 70–99)
Potassium: 4.3 mmol/L (ref 3.5–5.1)
Sodium: 145 mmol/L (ref 135–145)

## 2018-10-31 NOTE — Progress Notes (Signed)
Nutrition Follow-up  DOCUMENTATION CODES:   Non-severe (moderate) malnutrition in context of chronic illness  INTERVENTION:   -Continue Ensure Enlive po BID, each supplement provides 350 kcal and 20 grams of protein -Provide Magic cup TID with meals, each supplement provides 290 kcal and 9 grams of protein  NUTRITION DIAGNOSIS:   Moderate Malnutrition related to chronic illness(multiple myeloma) as evidenced by percent weight loss, energy intake < or equal to 75% for > or equal to 1 month, moderate fat depletion, moderate muscle depletion.  Ongoing.  GOAL:   Patient will meet greater than or equal to 90% of their needs  Progressing.  MONITOR:   PO intake, Supplement acceptance, Labs, Weight trends, I & O's  ASSESSMENT:   73 y.o. female past medical history of multiple myeloma on Cytoxan, chronic kidney disease with a baseline creatinine 1.2, history of DVT A. fib, for which her Eliquis was recently stopped comes in complaining of worsening abdominal pain for 2 weeks.  1/6: diet advanced to full liquid 1/8: diet advanced to soft diet  Patient began to eat solid food 1/10. Pt ordered cereal and fruit this morning for breakfast. Pt is not drinking Ensure supplements but will leave in case she will want to try them. Pt was eating pudding and ice cream, will order Magic Cups with all meals.  Per weight records, pt's weight is now consistent with admission weight.  Medications: OSCAL tablet daily, Ferrous sulfate daily, K-DUR tablet daily Labs reviewed: GFR: 50  Diet Order:   Diet Order            DIET SOFT Room service appropriate? Yes; Fluid consistency: Thin  Diet effective now              EDUCATION NEEDS:   Not appropriate for education at this time  Skin:  Skin Assessment: Reviewed RN Assessment  Last BM:  1/9  Height:   Ht Readings from Last 1 Encounters:  10/22/18 '5\' 5"'  (1.651 m)    Weight:   Wt Readings from Last 1 Encounters:  10/31/18 71.5 kg     Ideal Body Weight:  56.8 kg  BMI:  Body mass index is 26.24 kg/m.  Estimated Nutritional Needs:   Kcal:  1700-1900  Protein:  85-95g  Fluid:  1.5L/day  Clayton Bibles, MS, RD, LDN [place] [facility] Pager: [phone] After Hours Pager: [phone]

## 2018-10-31 NOTE — Progress Notes (Signed)
PROGRESS NOTE    Abigail [name]  OAC:[id number] DOB: Nov 28, 1945 DOA: 10/22/2018 PCP: Nolene Ebbs, MD    Brief Narrative:  Abigail [name] is an 73 y.o. female past medical history of multiple myeloma on Cytoxan, chronic kidney disease with a baseline creatinine 1.2, history of DVT A. fib, for which her Eliquis was recently stopped comes in complaining of worsening abdominal pain for 2 weeks. CT of the abdomen was obtained that showed left-sided colitis, critical care initially saw the patient and the patient decline aggressive intervention intubation and pressors   Assessment & Plan:   Principal Problem:   Sepsis ([place]) Active Problems:   Coronary atherosclerosis of native coronary artery   Protein-calorie malnutrition, severe   Tachycardia-bradycardia ([place])   Essential hypertension   Multiple myeloma in relapse ([place])   Acute kidney injury superimposed on chronic kidney disease ([place])   Colitis   Bradycardia   AF (paroxysmal atrial fibrillation) ([place])   Septic shock ([place])   Hypotension  Septic shock due to severe infectious colitis: Improving clinically.  Patient afebrile.  Leukocytosis trending down.  Patient tolerating full liquid diet and diet has been advanced to a soft diet.  Patient with nausea this morning.  General surgery ff.  Patient having bowel movements.  I/Os not correctly reported.  Status post 8 days of IV Zosyn.  IV Zosyn has been discontinued.  Supportive care.  Follow. General surgery following and appreciate input and recommendations.  Acute kidney injury: Baseline creatinine 1.3-1.6.    Creatinine trending down and currently around baseline at 1.25.  Patient showing poor oral intake at this time.  We will liberalize diet as patient with poor oral intake.  Saline lock IV fluids.  Follow.   Non-anion gap metabolic acidosis: Likely due to renal dysfunction.  Improved on bicarb tablets. Follow.  Bradycardia: Patient was in A. fib with RVR the morning of 10/28/2018 with  heart rates as high as the 160s.  Patient given IV Cardizem 10 mg x 1 as well as home regimen of amiodarone 100 mg daily and diltiazem.  Heart rate improved and now in the 50s.  Patient asymptomatic.  Follow closely.   Prolonged QT: Amiodarone has been resumed.  Repeat EKG with resolution of QT prolongation.  Follow.  SVT: Keep mag greater than 2 potassium greater than 4.  Heart rate improved.  Continue current regimen of amiodarone and Cardizem.  Multiple myeloma: Continue to hold Cytoxan.  Continue acyclovir.  Outpatient follow-up with oncology/hematology.    Anemia of chronic disease: Hemoglobin dropped to 6.8 the morning of 10/27/2018, likely dilutional component.  Patient denies any overt bleeding.  Patient transfused approximately 2 units of packed red blood cells hemoglobin currently at 8.3.  Follow H&H.  Transfusion threshold hemoglobin less than 8.  Anemia panel consistent with anemia of chronic disease.  Permanent atrial fibrillation with RVR: -Patient noted to be in A. fib with RVR the morning of 10/28/2018 with heart rates was ranging as high as the 160s.  Cardizem 10 mg IV push given.  Heart rate improved and in the 50s.  Continue home regimen of amiodarone and Cardizem.  Not on anticoagulation, her cardiologist held on admission 10/19/2018 due to nausea and a drop in hemoglobin.   - Follow-up with cardiology as an outpatient to dictate whether to resume anticoagulation.    Elevated cardiac biomarkers: In the setting of demand ischemia she denies any chest pain or shortness of breath EKG showed no signs of ischemia.  Coronary atherosclerosis of  native coronary artery Asymptomatic.  Continue statin.   Protein-calorie malnutrition, severe Patient currently on Ensure.  Patient with poor oral intake.  Will liberalize patient's diet to a regular diet to see if appetite improves.   Oral thrush: Status post oral Diflucan.  Continue current diet.   Goals of care: Patient  would like to be full code.  New mild hyperkalemia: Likely due to kidney injury as her creatinine and her bicarb normalized.  Patient was hypokalemic which has since been resolved.  Follow.  Hypokalemia Potassium repleted.  Home regimen oral supplementation of potassium has been resumed.   Hypernatremia Likely secondary to volume depletion.  Improved with D5W.  IV fluids have been saline locked.  Follow.      DVT prophylaxis: Lovenox Code Status: Full Family Communication: Updated patient.  No family at bedside. Disposition Plan: Home with home health versus skilled nursing facility when clinically improved and medically stable and when okay with general surgery.   Consultants:   General surgery: Dr. 10/22/2018  Curbside oncology: Dr. Benay Spice  Procedures:   CT abdomen and pelvis 10/22/2018  Chest x-ray 10/22/2018, 10/23/2018  Midline 10/27/2017  1-2 units PRBCs 10/27/2017  Antimicrobials:   IV vancomycin 10/22/2018>>>> 10/24/2018  IV Zosyn 10/22/2018>>>>10/30/2018   Subjective: Patient sitting up in chair.  Denies any chest pain or shortness of breath.  States abdominal pain improving.  Patient with poor appetite.  Objective: Vitals:   10/30/18 0829 10/30/18 1548 10/30/18 2125 10/31/18 0458  BP: (!) 151/74 (!) 157/69 (!) 162/68 (!) 178/86  Pulse: (!) 59 (!) 52 (!) 51 (!) 56  Resp: '16 16 16 18  ' Temp: 98.6 F (37 C) 98.8 F (37.1 C) 97.6 F (36.4 C) 98.7 F (37.1 C)  TempSrc:      SpO2: 100% 100% 96% 100%  Weight:    71.5 kg  Height:        Intake/Output Summary (Last 24 hours) at 10/31/2018 1205 Last data filed at 10/31/2018 0445 Gross per 24 hour  Intake -  Output 1000 ml  Net -1000 ml   Filed Weights   10/29/18 0633 10/30/18 0513 10/31/18 0458  Weight: 74.8 kg 78.8 kg 71.5 kg    Examination:  General exam: NAD Respiratory system: CTAB.  No wheezes, no crackles, no rhonchi.  Cardiovascular system: Irregularly irregular.  Bradycardia.  No JVD, no murmurs, no  rubs, no gallops.  No lower extremity edema.  Gastrointestinal system: Abdomen is soft, nondistended, decreasing tenderness to palpation in bilateral lower quadrants.  No rebound.  No guarding.  Central nervous system: Alert and oriented. No focal neurological deficits. Extremities: Symmetric 5 x 5 power. Skin: No rashes, lesions or ulcers Psychiatry: Judgement and insight appear normal. Mood & affect appropriate.     Data Reviewed: I have personally reviewed following labs and imaging studies  CBC: Recent Labs  Lab 10/26/18 0643 10/27/18 0610 10/28/18 0308 10/29/18 0419 10/30/18 0200  WBC 18.6* 13.7* 13.0* 12.1* 12.0*  NEUTROABS  --  10.2*  --  7.7  --   HGB 7.7* 6.8* 8.9* 8.2* 8.3*  HCT 26.2* 23.6* 29.3* 26.7* 27.1*  MCV 100.0 103.5* 94.8 98.2 97.8  PLT 195 155 172 148* 103   Basic Metabolic Panel: Recent Labs  Lab 10/27/18 0610  10/28/18 0308 10/28/18 1408 10/29/18 0419 10/30/18 0200 10/31/18 0608  NA 149*   < > 150* 148* 142 142 145  K 2.9*   < > 3.2* 3.6 3.2* 3.9 4.3  CL 124*   < >  119* 119* 115* 113* 116*  CO2 18*   < > 21* 23 21* 22 20*  GLUCOSE 82   < > 93 114* 107* 83 67*  BUN 17   < > '16 15 12 11 10  ' CREATININE 1.57*   < > 1.58* 1.58* 1.52* 1.47* 1.25*  CALCIUM 7.7*   < > 8.5* 8.2* 8.1* 8.0* 8.5*  MG 1.9  --  1.8  --  2.5*  --   --    < > = values in this interval not displayed.   GFR: Estimated Creatinine Clearance: 40.9 mL/min (A) (by C-G formula based on SCr of 1.25 mg/dL (H)). Liver Function Tests: No results for input(s): AST, ALT, ALKPHOS, BILITOT, PROT, ALBUMIN in the last 168 hours. No results for input(s): LIPASE, AMYLASE in the last 168 hours. No results for input(s): AMMONIA in the last 168 hours. Coagulation Profile: No results for input(s): INR, PROTIME in the last 168 hours. Cardiac Enzymes: No results for input(s): CKTOTAL, CKMB, CKMBINDEX, TROPONINI in the last 168 hours. BNP (last 3 results) No results for input(s): PROBNP in the  last 8760 hours. HbA1C: No results for input(s): HGBA1C in the last 72 hours. CBG: Recent Labs  Lab 10/24/18 1352 10/24/18 1714  GLUCAP 65* 74   Lipid Profile: No results for input(s): CHOL, HDL, LDLCALC, TRIG, CHOLHDL, LDLDIRECT in the last 72 hours. Thyroid Function Tests: No results for input(s): TSH, T4TOTAL, FREET4, T3FREE, THYROIDAB in the last 72 hours. Anemia Panel: No results for input(s): VITAMINB12, FOLATE, FERRITIN, TIBC, IRON, RETICCTPCT in the last 72 hours. Sepsis Labs: No results for input(s): PROCALCITON, LATICACIDVEN in the last 168 hours.  Recent Results (from the past 240 hour(s))  Blood culture (routine x 2)     Status: None   Collection Time: 10/22/18  8:21 AM  Result Value Ref Range Status   Specimen Description   Final    BLOOD RIGHT ARM Performed at [facility] [address], [place], [place] [address]    Special Requests   Final    BOTTLES DRAWN AEROBIC AND ANAEROBIC Blood Culture adequate volume Performed at [facility] [address], [place], [place] [address]    Culture   Final    NO GROWTH 5 DAYS Performed at [facility], [place] [address], [place], [place] [address]    Report Status 10/27/2018 FINAL  Final  Blood culture (routine x 2)     Status: None   Collection Time: 10/22/18  8:26 AM  Result Value Ref Range Status   Specimen Description BLOOD LEFT FOOT  Final   Special Requests   Final    BOTTLES DRAWN AEROBIC ONLY Blood Culture results may not be optimal due to an inadequate volume of blood received in culture bottles   Culture   Final    NO GROWTH 5 DAYS Performed at [facility], [place] [address], [place], [place] [address]    Report Status 10/27/2018 FINAL  Final  MRSA PCR Screening     Status: None   Collection Time: 10/22/18  3:18 PM  Result Value Ref Range Status   MRSA by PCR NEGATIVE NEGATIVE Final    Comment:        The GeneXpert MRSA Assay (FDA approved for NASAL  specimens only), is one component of a comprehensive MRSA colonization surveillance program. It is not intended to diagnose MRSA infection nor to guide or monitor treatment for MRSA infections. Performed at [facility], [place] [address], [place], [place] [address]          Radiology Studies: No results found.      Scheduled Meds: . sodium chloride   Intravenous Once  . acyclovir  400 mg Oral BID  . amiodarone  100 mg Oral Daily  . atorvastatin  40 mg Oral q1800  . calcium carbonate  1 tablet Oral Q breakfast  . diltiazem  30 mg Oral Q12H  . feeding supplement (ENSURE ENLIVE)  237 mL Oral TID BM  . ferrous sulfate  325 mg Oral Q breakfast  . pantoprazole  40 mg Oral BID AC  . potassium chloride  20 mEq Oral Daily  . pyridostigmine  30 mg Oral BID  . sodium chloride flush  10-40 mL Intracatheter Q12H   Continuous Infusions:    LOS: 9 days    Time spent: 35 minutes    Irine Seal, MD [facility]  If 7PM-7AM, please contact night-coverage [email or web address] Password [facility] 10/31/2018, 12:05 PM

## 2018-11-01 LAB — BASIC METABOLIC PANEL
Anion gap: 11 (ref 5–15)
BUN: 7 mg/dL — ABNORMAL LOW (ref 8–23)
CO2: 20 mmol/L — ABNORMAL LOW (ref 22–32)
Calcium: 8.5 mg/dL — ABNORMAL LOW (ref 8.9–10.3)
Chloride: 115 mmol/L — ABNORMAL HIGH (ref 98–111)
Creatinine, Ser: 1.08 mg/dL — ABNORMAL HIGH (ref 0.44–1.00)
GFR calc Af Amer: 60 mL/min — ABNORMAL LOW (ref >60)
GFR calc non Af Amer: 52 mL/min — ABNORMAL LOW (ref >60)
Glucose, Bld: 70 mg/dL (ref 70–99)
Potassium: 3.2 mmol/L — ABNORMAL LOW (ref 3.5–5.1)
Sodium: 146 mmol/L — ABNORMAL HIGH (ref 135–145)

## 2018-11-01 LAB — CBC
HCT: 30.4 % — ABNORMAL LOW (ref 36.0–46.0)
Hemoglobin: 9 g/dL — ABNORMAL LOW (ref 12.0–15.0)
MCH: 29.2 pg (ref 26.0–34.0)
MCHC: 29.6 g/dL — ABNORMAL LOW (ref 30.0–36.0)
MCV: 98.7 fL (ref 80.0–100.0)
Platelets: 211 10*3/uL (ref 150–400)
RBC: 3.08 MIL/uL — ABNORMAL LOW (ref 3.87–5.11)
RDW: 20.9 % — ABNORMAL HIGH (ref 11.5–15.5)
WBC: 11.9 10*3/uL — ABNORMAL HIGH (ref 4.0–10.5)
nRBC: 0 % (ref 0.0–0.2)

## 2018-11-01 MED ORDER — POTASSIUM CHLORIDE CRYS ER 20 MEQ PO TBCR
40.0000 meq | EXTENDED_RELEASE_TABLET | Freq: Once | ORAL | Status: AC
  Administered 2018-11-01: 40 meq via ORAL
  Filled 2018-11-01: qty 2

## 2018-11-01 MED ORDER — MORPHINE SULFATE (PF) 2 MG/ML IV SOLN
2.0000 mg | INTRAVENOUS | Status: DC | PRN
  Administered 2018-11-02: 2 mg via INTRAVENOUS
  Filled 2018-11-01 (×2): qty 1

## 2018-11-01 NOTE — Progress Notes (Signed)
PT Cancellation Note  Patient Details Name: Abigail [name] MRN: [id number] DOB: July 04, 1946   Cancelled Treatment:    Reason Eval/Treat Not Completed: Pain limiting ability to participate, patient was just assisted to recliner/Has not gotten IV for pain meds. Continue PT as patient able.   Claretha Cooper 11/01/2018, 3:31 PM  [place] Pager [phone] Office [phone]

## 2018-11-01 NOTE — Progress Notes (Signed)
PROGRESS NOTE    Abigail [name]  YQM:[id number] DOB: [birth year] DOA: 10/22/2018 PCP: Nolene Ebbs, MD    Brief Narrative:  Abigail [name] is an 73 y.o. female past medical history of multiple myeloma on Cytoxan, chronic kidney disease with a baseline creatinine 1.2, history of DVT A. fib, for which her Eliquis was recently stopped comes in complaining of worsening abdominal pain for 2 weeks. CT of the abdomen was obtained that showed left-sided colitis, critical care initially saw the patient and the patient decline aggressive intervention intubation and pressors   Assessment & Plan:   Principal Problem:   Sepsis ([place]) Active Problems:   Coronary atherosclerosis of native coronary artery   Protein-calorie malnutrition, severe   Tachycardia-bradycardia ([place])   Essential hypertension   Multiple myeloma in relapse ([place])   Acute kidney injury superimposed on chronic kidney disease ([place])   Colitis   Bradycardia   AF (paroxysmal atrial fibrillation) ([place])   Septic shock ([place])   Hypotension  Septic shock due to severe infectious colitis: Improving clinically.  Patient afebrile.  Leukocytosis trending down.  Patient tolerating full liquid diet and diet has been advanced to a soft diet.  Patient with nausea.  Patient still with some complaints of abdominal pain however clinically improved overall.  General surgery ff.  Patient having bowel movements.  I/Os not correctly reported.  Status post 8 days of IV Zosyn.  IV Zosyn has been discontinued.  Supportive care.  Follow. General surgery following and appreciate input and recommendations.  Acute kidney injury: Baseline creatinine 1.3-1.6.    Creatinine trending down and currently around baseline at 1.08.  Patient showing poor oral intake at this time.  Diet has been liberalized.  IV fluids saline lock.  Follow.   Non-anion gap metabolic acidosis: Likely due to renal dysfunction.  Improved on bicarb tablets. Follow.  Bradycardia: Patient was  in A. fib with RVR the morning of 10/28/2018 with heart rates as high as the 160s.  Patient given IV Cardizem 10 mg x 1 as well as home regimen of amiodarone 100 mg daily and diltiazem.  Heart rate improved and now in the 50s.  Patient asymptomatic.  Follow closely.   Prolonged QT: Amiodarone has been resumed.  Repeat EKG with resolution of QT prolongation.  Follow.  SVT: Keep mag greater than 2 potassium greater than 4.  Heart rate improved.  Continue current regimen of amiodarone and Cardizem.  Multiple myeloma: Continue to hold Cytoxan.  Continue acyclovir.  Outpatient follow-up with oncology/hematology.    Anemia of chronic disease: Hemoglobin dropped to 6.8 the morning of 10/27/2018, likely dilutional component.  Patient denies any overt bleeding.  Patient transfused approximately 2 units of packed red blood cells hemoglobin currently at 8.3.  Follow H&H.  Transfusion threshold hemoglobin less than 8.  Anemia panel consistent with anemia of chronic disease.  Permanent atrial fibrillation with RVR: -Patient noted to be in A. fib with RVR the morning of 10/28/2018 with heart rates was ranging as high as the 160s.  Cardizem 10 mg IV push given.  Heart rate improved and in the 50s.  Continue home regimen of amiodarone and Cardizem.  Not on anticoagulation, her cardiologist held on admission 10/19/2018 due to nausea and a drop in hemoglobin.   - Follow-up with cardiology as an outpatient to dictate whether to resume anticoagulation.    Elevated cardiac biomarkers: In the setting of demand ischemia she denies any chest pain or shortness of breath EKG showed no signs of  ischemia.  Coronary atherosclerosis of native coronary artery Asymptomatic.  Continue statin.   Protein-calorie malnutrition, severe Patient currently on Ensure.  Patient with poor oral intake.  Diet has been liberalized.   Oral thrush: Status post oral Diflucan.  Continue current diet.   Goals of care: Patient would  like to be full code.  New mild hyperkalemia: Likely due to kidney injury as her creatinine and her bicarb normalized.  Patient was hypokalemic. Follow.  Hypokalemia K. Dur 40 mEq p.o. x1.  Home regimen oral potassium supplementation has been resumed.    Hypernatremia Likely secondary to volume depletion.  Improved with D5W.  IV fluids have been saline locked.  Follow.      DVT prophylaxis: Lovenox Code Status: Full Family Communication: Updated patient.  No family at bedside. Disposition Plan: Home with home health versus skilled nursing facility when clinically improved and medically stable and when okay with general surgery hopefully in the next 24 hours.   Consultants:   General surgery: Dr. 10/22/2018  Curbside oncology: Dr. Benay Spice  Procedures:   CT abdomen and pelvis 10/22/2018  Chest x-ray 10/22/2018, 10/23/2018  Midline 10/27/2017  1-2 units PRBCs 10/27/2017  Antimicrobials:   IV vancomycin 10/22/2018>>>> 10/24/2018  IV Zosyn 10/22/2018>>>>10/30/2018   Subjective: Patient laying in bed.  States she does not feel too well this morning.  Complaining of significant left lower quadrant pain.  Has not eaten breakfast yet.    Objective: Vitals:   10/31/18 1333 10/31/18 1400 10/31/18 2037 11/01/18 0506  BP: (!) 147/87 (!) 157/67 (!) 164/67 (!) 177/68  Pulse: 66 72 (!) 54 (!) 48  Resp: '18 18 18 18  ' Temp: 98.8 F (37.1 C) 98.1 F (36.7 C) 98.7 F (37.1 C) (!) 97.3 F (36.3 C)  TempSrc: Oral Oral    SpO2: 97% 100% 96% 97%  Weight:    71.7 kg  Height:        Intake/Output Summary (Last 24 hours) at 11/01/2018 1111 Last data filed at 11/01/2018 0300 Gross per 24 hour  Intake 120 ml  Output 700 ml  Net -580 ml   Filed Weights   10/30/18 0513 10/31/18 0458 11/01/18 0506  Weight: 78.8 kg 71.5 kg 71.7 kg    Examination:  General exam: NAD Respiratory system: Lungs clear to auscultation bilaterally.  No wheezes, no crackles, no rhonchi.  Cardiovascular system:  Irregularly irregular.  Bradycardia.  No JVD, no murmurs, no rubs, no gallops.  No lower extremity edema.  Gastrointestinal system: Abdomen is nondistended, soft, positive bowel sounds.  Tenderness to palpation left lower quadrant.  No rebound.  No guarding. Central nervous system: Alert and oriented. No focal neurological deficits. Extremities: Symmetric 5 x 5 power. Skin: No rashes, lesions or ulcers Psychiatry: Judgement and insight appear normal. Mood & affect appropriate.     Data Reviewed: I have personally reviewed following labs and imaging studies  CBC: Recent Labs  Lab 10/27/18 0610 10/28/18 0308 10/29/18 0419 10/30/18 0200 11/01/18 0751  WBC 13.7* 13.0* 12.1* 12.0* 11.9*  NEUTROABS 10.2*  --  7.7  --   --   HGB 6.8* 8.9* 8.2* 8.3* 9.0*  HCT 23.6* 29.3* 26.7* 27.1* 30.4*  MCV 103.5* 94.8 98.2 97.8 98.7  PLT 155 172 148* 153 837   Basic Metabolic Panel: Recent Labs  Lab 10/27/18 0610  10/28/18 0308 10/28/18 1408 10/29/18 0419 10/30/18 0200 10/31/18 0608 11/01/18 0751  NA 149*   < > 150* 148* 142 142 145 146*  K 2.9*   < >  3.2* 3.6 3.2* 3.9 4.3 3.2*  CL 124*   < > 119* 119* 115* 113* 116* 115*  CO2 18*   < > 21* 23 21* 22 20* 20*  GLUCOSE 82   < > 93 114* 107* 83 67* 70  BUN 17   < > '16 15 12 11 10 ' 7*  CREATININE 1.57*   < > 1.58* 1.58* 1.52* 1.47* 1.25* 1.08*  CALCIUM 7.7*   < > 8.5* 8.2* 8.1* 8.0* 8.5* 8.5*  MG 1.9  --  1.8  --  2.5*  --   --   --    < > = values in this interval not displayed.   GFR: Estimated Creatinine Clearance: 47.4 mL/min (A) (by C-G formula based on SCr of 1.08 mg/dL (H)). Liver Function Tests: No results for input(s): AST, ALT, ALKPHOS, BILITOT, PROT, ALBUMIN in the last 168 hours. No results for input(s): LIPASE, AMYLASE in the last 168 hours. No results for input(s): AMMONIA in the last 168 hours. Coagulation Profile: No results for input(s): INR, PROTIME in the last 168 hours. Cardiac Enzymes: No results for input(s):  CKTOTAL, CKMB, CKMBINDEX, TROPONINI in the last 168 hours. BNP (last 3 results) No results for input(s): PROBNP in the last 8760 hours. HbA1C: No results for input(s): HGBA1C in the last 72 hours. CBG: No results for input(s): GLUCAP in the last 168 hours. Lipid Profile: No results for input(s): CHOL, HDL, LDLCALC, TRIG, CHOLHDL, LDLDIRECT in the last 72 hours. Thyroid Function Tests: No results for input(s): TSH, T4TOTAL, FREET4, T3FREE, THYROIDAB in the last 72 hours. Anemia Panel: No results for input(s): VITAMINB12, FOLATE, FERRITIN, TIBC, IRON, RETICCTPCT in the last 72 hours. Sepsis Labs: No results for input(s): PROCALCITON, LATICACIDVEN in the last 168 hours.  Recent Results (from the past 240 hour(s))  MRSA PCR Screening     Status: None   Collection Time: 10/22/18  3:18 PM  Result Value Ref Range Status   MRSA by PCR NEGATIVE NEGATIVE Final    Comment:        The GeneXpert MRSA Assay (FDA approved for NASAL specimens only), is one component of a comprehensive MRSA colonization surveillance program. It is not intended to diagnose MRSA infection nor to guide or monitor treatment for MRSA infections. Performed at [facility], [place] [address], [place],  [address]          Radiology Studies: No results found.      Scheduled Meds: . sodium chloride   Intravenous Once  . acyclovir  400 mg Oral BID  . amiodarone  100 mg Oral Daily  . atorvastatin  40 mg Oral q1800  . calcium carbonate  1 tablet Oral Q breakfast  . diltiazem  30 mg Oral Q12H  . feeding supplement (ENSURE ENLIVE)  237 mL Oral TID BM  . ferrous sulfate  325 mg Oral Q breakfast  . pantoprazole  40 mg Oral BID AC  . potassium chloride  20 mEq Oral Daily  . potassium chloride  40 mEq Oral Once  . pyridostigmine  30 mg Oral BID  . sodium chloride flush  10-40 mL Intracatheter Q12H   Continuous Infusions:    LOS: 10 days    Time spent: 35 minutes    Irine Seal, MD [facility]  If 7PM-7AM, please contact night-coverage [email or web address] Password [email or web address] 11/01/2018, 11:11 AM

## 2018-11-01 NOTE — Progress Notes (Addendum)
[facility] Progress Note     Subjective: CC-  Patient states that she's much better than date of admission, but feels about the same as yesterday. She has not eaten breakfast yet. Does not like Boost or Ensure. Unsure what/how much she ate for dinner. Thinks she had a BM, unsure of consistency. RN also unsure. WBC 11.9, afebrile.  Objective: Vital signs in last 24 hours: Temp:  [97.3 F (36.3 C)-98.8 F (37.1 C)] 97.3 F (36.3 C) (01/13 0506) Pulse Rate:  [48-72] 48 (01/13 0506) Resp:  [18] 18 (01/13 0506) BP: (147-177)/(67-87) 177/68 (01/13 0506) SpO2:  [96 %-100 %] 97 % (01/13 0506) Weight:  [71.7 kg] 71.7 kg (01/13 0506) Last BM Date: 10/31/18  Intake/Output from previous day: 01/12 0701 - 01/13 0700 In: 240 [P.O.:240] Out: 700 [Urine:700] Intake/Output this shift: No intake/output data recorded.  PE: Gen:  Alert, NAD, pleasant HEENT: EOM's intact, pupils equal and round Pulm:  effort normal Abd: soft, nondistended, +BS, tender left side of abomen Skin: warm and dry  Lab Results:  Recent Labs    10/30/18 0200 11/01/18 0751  WBC 12.0* 11.9*  HGB 8.3* 9.0*  HCT 27.1* 30.4*  PLT 153 211   BMET Recent Labs    10/31/18 0608 11/01/18 0751  NA 145 146*  K 4.3 3.2*  CL 116* 115*  CO2 20* 20*  GLUCOSE 67* 70  BUN 10 7*  CREATININE 1.25* 1.08*  CALCIUM 8.5* 8.5*   PT/INR No results for input(s): LABPROT, INR in the last 72 hours. CMP     Component Value Date/Time   NA 146 (H) 11/01/2018 0751   NA 144 09/17/2017 1429   K 3.2 (L) 11/01/2018 0751   K 3.1 (L) 09/17/2017 1429   CL 115 (H) 11/01/2018 0751   CO2 20 (L) 11/01/2018 0751   CO2 23 09/17/2017 1429   GLUCOSE 70 11/01/2018 0751   GLUCOSE 96 09/17/2017 1429   BUN 7 (L) 11/01/2018 0751   BUN 15.6 09/17/2017 1429   CREATININE 1.08 (H) 11/01/2018 0751   CREATININE 1.97 (H) 10/21/2018 1055   CREATININE 1.0 09/17/2017 1429   CALCIUM 8.5 (L) 11/01/2018 0751   CALCIUM 8.6 09/17/2017  1429   PROT 5.7 (L) 10/23/2018 0335   PROT 6.6 09/17/2017 1429   ALBUMIN 2.7 (L) 10/23/2018 0335   ALBUMIN 3.6 09/17/2017 1429   AST 31 10/23/2018 0335   AST 15 10/07/2018 0823   AST 14 09/17/2017 1429   ALT 11 10/23/2018 0335   ALT 7 10/07/2018 0823   ALT 11 09/17/2017 1429   ALKPHOS 48 10/23/2018 0335   ALKPHOS 93 09/17/2017 1429   BILITOT 1.1 10/23/2018 0335   BILITOT 1.1 10/07/2018 0823   BILITOT 1.22 (H) 09/17/2017 1429   GFRNONAA 52 (L) 11/01/2018 0751   GFRNONAA 25 (L) 10/21/2018 1055   GFRAA 60 (L) 11/01/2018 0751   GFRAA 29 (L) 10/21/2018 1055   Lipase     Component Value Date/Time   LIPASE 36 10/22/2018 0815       Studies/Results: No results found.  Anti-infectives: Anti-infectives (From admission, onward)   Start     Dose/Rate Route Frequency Ordered Stop   10/26/18 1000  fluconazole (DIFLUCAN) tablet 100 mg     100 mg Oral Daily 10/26/18 0743 10/29/18 1008   10/25/18 1230  piperacillin-tazobactam (ZOSYN) IVPB 3.375 g  Status:  Discontinued     3.375 g 12.5 mL/hr over 240 Minutes Intravenous Every 8 hours 10/25/18 1202 10/31/18 6237  10/24/18 1000  fluconazole (DIFLUCAN) IVPB 100 mg  Status:  Discontinued     100 mg 50 mL/hr over 60 Minutes Intravenous Every 24 hours 10/24/18 0740 10/26/18 0743   10/22/18 2200  piperacillin-tazobactam (ZOSYN) IVPB 2.25 g  Status:  Discontinued     2.25 g 100 mL/hr over 30 Minutes Intravenous Every 8 hours 10/22/18 1241 10/25/18 1202   10/22/18 2200  acyclovir (ZOVIRAX) tablet 400 mg     400 mg Oral 2 times daily 10/22/18 1321     10/22/18 1545  vancomycin (VANCOCIN) 50 mg/mL oral solution 125 mg  Status:  Discontinued     125 mg Oral 4 times daily 10/22/18 1334 10/24/18 0738   10/22/18 1239  vancomycin variable dose per unstable renal function (pharmacist dosing)  Status:  Discontinued      Does not apply See admin instructions 10/22/18 1241 10/22/18 1321   10/22/18 0830  vancomycin (VANCOCIN) IVPB 1000 mg/200 mL  premix     1,000 mg 200 mL/hr over 60 Minutes Intravenous  Once 10/22/18 0828 10/22/18 1101   10/22/18 0830  ceFEPIme (MAXIPIME) 2 g in sodium chloride 0.9 % 100 mL IVPB     2 g 200 mL/hr over 30 Minutes Intravenous  Once 10/22/18 0828 10/22/18 0865       Assessment/Plan Multiple myeloma - on chemotherapy HTN HLD A fib not on anticoagulation GERD CKD CAD s/p cardiac stent x2 H/o DVT 02/2017 Code status DNR  Colitis in the setting of active chemo treatment -Patient has been off antibiotics over the weekend and overall seems to be better, although still having some abdominal pain. No peritonitis on exam and she does not appear septic. WBC not going up but still elevated at 11.9, afebrile. No indications for surgical intervention. Seems stable for discharge from surgical standpoint. Recommend outpatient f/u with GI. General surgery will sign off, please call with concerns.  ID - none currently FEN - reg diet VTE - SCDs   LOS: 10 days    Wellington Hampshire , [facility] 11/01/2018, 10:01 AM Pager: [phone] Mon 7:00 am -11:30 AM Tues-Fri 7:00 am-4:30 pm Sat-Sun 7:00 am-11:30 am

## 2018-11-02 LAB — BASIC METABOLIC PANEL
Anion gap: 12 (ref 5–15)
BUN: 7 mg/dL — AB (ref 8–23)
CALCIUM: 8.7 mg/dL — AB (ref 8.9–10.3)
CO2: 22 mmol/L (ref 22–32)
CREATININE: 1 mg/dL (ref 0.44–1.00)
Chloride: 111 mmol/L (ref 98–111)
GFR calc Af Amer: >60 mL/min (ref >60)
GFR calc non Af Amer: 57 mL/min — ABNORMAL LOW (ref >60)
Glucose, Bld: 84 mg/dL (ref 70–99)
Potassium: 3.2 mmol/L — ABNORMAL LOW (ref 3.5–5.1)
Sodium: 145 mmol/L (ref 135–145)

## 2018-11-02 MED ORDER — POTASSIUM CHLORIDE CRYS ER 20 MEQ PO TBCR
40.0000 meq | EXTENDED_RELEASE_TABLET | Freq: Once | ORAL | Status: AC
  Administered 2018-11-02: 40 meq via ORAL
  Filled 2018-11-02: qty 2

## 2018-11-02 MED ORDER — OXYCODONE-ACETAMINOPHEN 5-325 MG PO TABS: 1.0000 | ORAL_TABLET | ORAL | Status: DC | PRN

## 2018-11-02 MED ORDER — OXYCODONE-ACETAMINOPHEN 5-325 MG PO TABS: 1.0000 | ORAL_TABLET | ORAL | 0 refills | Status: DC | PRN

## 2018-11-02 NOTE — Care Management Important Message (Signed)
Important Message  Patient Details  Name: Abigail [name] MRN: [id number] Date of Birth: [birth year]   Medicare Important Message Given:  Yes    Kerin Salen 11/02/2018, 11:00 AMImportant Message  Patient Details  Name: Abigail [name] MRN: [id number] Date of Birth: June 20, 1946   Medicare Important Message Given:  Yes    Kerin Salen 11/02/2018, 11:00 AM

## 2018-11-02 NOTE — Progress Notes (Signed)
Abigail [name] to be D/C'd Home per MD order.  Discussed prescriptions and follow up appointments with the patient. Prescriptions given to patient, medication list explained in detail. Pt verbalized understanding.  Allergies as of 11/02/2018      Reactions   Sulfa Antibiotics Rash      Medication List    STOP taking these medications   HYDROcodone-acetaminophen 5-325 MG tablet Commonly known as:  NORCO/VICODIN     TAKE these medications   acyclovir 400 MG tablet Commonly known as:  ZOVIRAX TAKE 1 TABLET (400 MG TOTAL) BY MOUTH 2 (TWO) TIMES DAILY.   amiodarone 200 MG tablet Commonly known as:  PACERONE Take 0.5 tablets (100 mg total) by mouth daily.   atorvastatin 80 MG tablet Commonly known as:  LIPITOR Take 0.5 tablets (40 mg total) by mouth daily at 6 PM.   calcium carbonate 1250 (500 Ca) MG tablet Commonly known as:  OS-CAL - dosed in mg of elemental calcium Take 1 tablet by mouth daily with breakfast.   cyclophosphamide 50 MG capsule Commonly known as:  CYTOXAN Take 10 capsules (500mg ) by mouth once weekly for 3 weeks on, 1 week off, repeat every 4 weeks. Take early in the day. Maintain hydration.   dexamethasone 4 MG tablet Commonly known as:  DECADRON Take 5 tablets (20mg ) weekly day of chemotherapy   diltiazem 30 MG tablet Commonly known as:  CARDIZEM Take 1 tablet (30 mg total) by mouth every 12 (twelve) hours. Notes to patient:  10 pm tonight    feeding supplement (ENSURE ENLIVE) Liqd Take 237 mLs by mouth 2 (two) times daily between meals. What changed:    when to take this  additional instructions   ferrous sulfate 325 (65 FE) MG tablet Take 1 tablet (325 mg total) by mouth daily with breakfast.   nitroGLYCERIN 0.4 MG SL tablet Commonly known as:  NITROSTAT Place 0.4 mg under the tongue every 5 (five) minutes as needed for chest pain.   oxyCODONE-acetaminophen 5-325 MG tablet Commonly known as:  PERCOCET/ROXICET Take 1 tablet by mouth every 4 (four)  hours as needed for moderate pain or severe pain.   pantoprazole 40 MG tablet Commonly known as:  PROTONIX Take 1 tablet (40 mg total) by mouth 2 (two) times daily before a meal.   Potassium Chloride ER 20 MEQ Tbcr Commonly known as:  K-TAB Take 20 mEq by mouth daily.   pyridostigmine 60 MG tablet Commonly known as:  MESTINON Take 0.5 tablets (30 mg total) by mouth 2 (two) times daily.   tiZANidine 4 MG tablet Commonly known as:  ZANAFLEX Take 4 mg by mouth at bedtime as needed for muscle spasms.       Vitals:   11/02/18 0459 11/02/18 1340  BP: (!) 161/73 (!) 149/62  Pulse: (!) 54 (!) 59  Resp: 16 17  Temp: 98.3 F (36.8 C) 99.5 F (37.5 C)  SpO2: 100% 99%    Skin clean, dry and intact without evidence of skin break down, no evidence of skin tears noted. IV catheter discontinued intact. Site without signs and symptoms of complications. Dressing and pressure applied. Pt denies pain at this time. No complaints noted.  An After Visit Summary was printed and given to the patient. Patient escorted via [place], and D/C home via private auto.  Lolita Rieger 11/02/2018 4:51 PM

## 2018-11-02 NOTE — Progress Notes (Signed)
Physical Therapy Treatment Patient Details Name: Abigail [name] MRN: [id number] DOB: [birth year] Today's Date: 11/02/2018    History of Present Illness 73 y.o. female past medical history of multiple myeloma on Cytoxan, chronic kidney disease admitted with sepsis d/t severe colitis    PT Comments    Progressing with mobility. Unsure of how much assistance pt will have available at home. Will continue to follow.    Follow Up Recommendations  SNF(pt is refusing placement per chart. HHPT and 24/7 supervision/assist if she returns hoome)     Equipment Recommendations  None recommended by PT    Recommendations for Other Services       Precautions / Restrictions Precautions Precautions: Fall Restrictions Weight Bearing Restrictions: No    Mobility  Bed Mobility Overal bed mobility: Needs Assistance Bed Mobility: Supine to Sit     Supine to sit: Supervision;HOB elevated     General bed mobility comments: for safety. Increased time. Pt c/o lightheadedness.   Transfers Overall transfer level: Needs assistance Equipment used: Rolling walker (2 wheeled) Transfers: Sit to/from Stand Sit to Stand: Min assist;From elevated surface         General transfer comment: Assist to rise, stabilize, control descent. VCs safety, hand placement. Increased time to rise.   Ambulation/Gait Ambulation/Gait assistance: Min assist Gait Distance (Feet): 30 Feet Assistive device: Rolling walker (2 wheeled) Gait Pattern/deviations: Step-through pattern;Decreased stride length     General Gait Details: Assist to stabilize pt throughout distance. Followed closely with recliner. No LE buckling noted this session. Pt tolerated distance well.    Stairs             Wheelchair Mobility    Modified Rankin (Stroke Patients Only)       Balance Overall balance assessment: Needs assistance         Standing balance support: Bilateral upper extremity supported Standing balance-Leahy  Scale: Poor                              Cognition Arousal/Alertness: Awake/alert Behavior During Therapy: Flat affect Overall Cognitive Status: Within Functional Limits for tasks assessed                         Following Commands: Follows one step commands with increased time              Exercises      General Comments        Pertinent Vitals/Pain Pain Assessment: Faces Faces Pain Scale: No hurt    Home Living                      Prior Function            PT Goals (current goals can now be found in the care plan section) Progress towards PT goals: Progressing toward goals    Frequency    Min 2X/week      PT Plan Current plan remains appropriate    Co-evaluation              AM-PAC PT "6 Clicks" Mobility   Outcome Measure  Help needed turning from your back to your side while in a flat bed without using bedrails?: A Little Help needed moving from lying on your back to sitting on the side of a flat bed without using bedrails?: A Little Help needed moving to and from a bed to a chair (  including a wheelchair)?: A Little Help needed standing up from a chair using your arms (e.g., wheelchair or bedside chair)?: A Little Help needed to walk in hospital room?: A Little Help needed climbing 3-5 steps with a railing? : A Lot 6 Click Score: 17    End of Session Equipment Utilized During Treatment: Gait belt Activity Tolerance: Patient tolerated treatment well Patient left: in chair;with call bell/phone within reach;with chair alarm set   PT Visit Diagnosis: Muscle weakness (generalized) (M62.81);Unsteadiness on feet (R26.81);Difficulty in walking, not elsewhere classified (R26.2)     Time: 8466-5993 PT Time Calculation (min) (ACUTE ONLY): 17 min  Charges:  $Gait Training: 8-22 mins                       Weston Anna, PT Acute Rehabilitation Services Pager: [phone] Office: [phone]

## 2018-11-02 NOTE — Discharge Summary (Signed)
Physician Discharge Summary  Abigail [name] WGY:[id number] DOB: November 04, 1945 DOA: 10/22/2018  PCP: Abigail Ebbs, MD  Admit date: 10/22/2018 Discharge date: 11/02/2018  Time spent: 60 minutes  Recommendations for Outpatient Follow-up:  1. Follow-up with Abigail Ebbs, MD in 2 weeks.  On follow-up patient will need a basic metabolic profile done to follow-up on electrolytes and renal function. 2. Follow-up with Dr. Loletha Carrow [name], gastroenterology in 1 to 2 weeks for follow-up on left lower quadrant abdominal pain and colitis.    Discharge Diagnoses:  Principal Problem:   Sepsis ([place]) Active Problems:   Coronary atherosclerosis of native coronary artery   Protein-calorie malnutrition, severe   Tachycardia-bradycardia ([place])   Essential hypertension   Multiple myeloma in relapse ([place])   Acute kidney injury superimposed on chronic kidney disease ([facility])   Colitis   Bradycardia   AF (paroxysmal atrial fibrillation) ([place])   Septic shock ([place])   Hypotension   Discharge Condition: Stable and improved  Diet recommendation: Heart healthy  Filed Weights   10/31/18 0458 11/01/18 0506 11/02/18 0500  Weight: 71.5 kg 71.7 kg 74.1 kg    History of present illness:  Per Dr. Dario Ave SHARESA [name] is a 73 y.o. F with hx MM on Cytoxan, CKD baseline Cr 1.2, hx DVT and Afib recently stopped Eliquis and CAD who presented with worsening abdominal pain for 2 weeks, now severe weakness. The patient has had slowly progressive colicky abdominal pain on the left side for 2-3 weeks without diarrhea or hematochezia.  She was recently admitted for abdominal pain and weakness, diagnosed with Afib with RVR, dehydration and discharged on new amiodarone 2 days ago.  Since then, she has been no better.  She is unable to walk, has severe worsening colicky left abdominal pain with new loose stools, anorexia, malaise, but no hematochezia or melena or fever.  Finally, the pain was severe so she came to the ER.  ED course: -HR  49, respirations and pulse ox normal, BP 88/52 -Na 136, K 3.5, Cr 3.2 (baseline 1.2), WBC 13.7K, Hgb 10 -Lactic acid 3.2 -CXR showed bilateral atelectasis no pneumonia or effusion -UA no leukocytes -ECG showed rate 98, sinus bradycardia with superimposed PACs, no ST changes; prolonged QTc -troponin 0.08 -Blood cultures were obtained, and vancomycin and cefepime were ordered for sepsis unclear source -CCM were consulted, but the patient declined aggressive interventions, central line, pressors, intubation and so the hospitalist service were asked to evalaute for admission -CT abdomen was obtained without contrast that showed left sided colitis, infectious versus ischemic -While in the ER, the patient had an episode of SVT, regular narrow complex tachycardia that resolved with valsalva  Hospital Course:  Septic shock due to severe infectious colitis: Patient admitted and noted to be septic shock secondary to severe infectious colitis.  CT abdomen and pelvis which was done was concerning for colitis.  Patient also noted to have a significant leukocytosis.  Patient was placed empirically on IV antibiotics, IV fluids, supportive care.  General surgery was consulted and followed the patient throughout the hospitalization.  Patient initially placed on bowel rest and monitored.  Diet was slowly advanced which patient tolerated however did have decreased oral intake and diet has to be liberalized for better oral intake.  Patient received a total of 8 days of IV antibiotics.  Patient was followed by general surgery throughout the hospitalization.  Patient improved clinically abdominal pain improved and patient be discharged home in stable and improved condition.  Outpatient follow-up with gastroenterology.  Acute kidney injury: Baseline creatinine 1.3-1.6.   Creatinine trended down during the hospitalization and creatinine was down to 1.00 by day of discharge.  Patient noted to have poor oral intake.   Diet was liberalized for better oral intake.  Outpatient follow-up with PCP.   Non-anion gap metabolic acidosis: Likely due to renal dysfunction.  Improved on bicarb tablets. Follow.  Bradycardia: Patient was in A. fib with RVR the morning of 10/28/2018 with heart rates as high as the 160s.  Patient given IV Cardizem 10 mg x 1 as well as home regimen of amiodarone 100 mg daily and diltiazem.  Heart rate improved and remained in the high 50s.  Heart rate improved on ambulation.  Patient remained asymptomatic.  Outpatient follow-up with cardiology as previously scheduled.   Prolonged QT: Amiodarone has been resumed.  Repeat EKG with resolution of QT prolongation.  Follow.  SVT: Keep mag greater than 2 potassium greater than 4.  Heart rate improved.  Patient maintained on home regimen of amiodarone and Cardizem.  Outpatient follow-up with cardiology.   Multiple myeloma: Cytoxan was held during the hospitalization.  Patient maintained on home regimen of acyclovir.  Outpatient follow-up with hematology/oncology as scheduled.   Anemia of chronic disease: Hemoglobin dropped to 6.8 the morning of 10/27/2018, likely dilutional component.  Patient denied any overt bleeding.  Patient transfused approximately 2 units of packed red blood cells hemoglobin stabilized at 9.0 by day of discharge.  Anemia panel which was done was consistent with anemia of chronic disease.  Outpatient follow-up with PCP.    Permanent atrial fibrillation with RVR: -Patient noted to be in A. fib with RVR the morning of 10/28/2018 with heart rates was ranging as high as the 160s.  Cardizem 10 mg IV push given.  Heart rate improved and in the 50s.  Patient maintained on home regimen of amiodarone and Cardizem.  Not on anticoagulation, her cardiologist held on admission 10/19/2018 due to nausea and a drop in hemoglobin.  - Follow-up with cardiology in the outpatient setting, to dictate whether to resume anticoagulation.  Elevated  cardiac biomarkers: In the setting of demand ischemia she denied any chest pain or shortness of breath EKG showed no signs of ischemia.  No further cardiac work-up needed.  Coronary atherosclerosis of native coronary artery Asymptomatic.    Patient maintained on statin as well as amiodarone and Cardizem.  Outpatient follow-up with cardiology.   Protein-calorie malnutrition, severe Patient placed on Ensure supplementation and diet liberalized for better oral intake.  Outpatient follow-up.  Oral thrush: Patient received a full course of Diflucan.   Goals of care: Patient would like to be full code.  New mild hyperkalemia: Likely due to kidney injury as her creatinine and her bicarb normalized.    Hypokalemia Potassium was repleted during the hospitalization and patient resumed back on home regimen of oral potassium supplementation.  Outpatient follow-up.   Hypernatremia Likely secondary to volume depletion.  Resolved with D5W.  IV fluids subsequently discontinued.  Outpatient follow-up with PCP.   Procedures:  CT abdomen and pelvis 10/22/2018  Chest x-ray 10/22/2018, 10/23/2018  Midline 10/27/2017  1-2 units PRBCs 10/27/2017  Consultations:  General surgery: Dr. 10/22/2018  Curbside oncology: Dr. Benay Spice  Discharge Exam: Vitals:   11/02/18 0459 11/02/18 1340  BP: (!) 161/73 (!) 149/62  Pulse: (!) 54 (!) 59  Resp: 16 17  Temp: 98.3 F (36.8 C) 99.5 F (37.5 C)  SpO2: 100% 99%    General: NAD Cardiovascular: RRR Respiratory: CTAB  Discharge Instructions   Discharge Instructions    Diet - low sodium heart healthy   Complete by:  As directed    Increase activity slowly   Complete by:  As directed      Allergies as of 11/02/2018      Reactions   Sulfa Antibiotics Rash      Medication List    STOP taking these medications   HYDROcodone-acetaminophen 5-325 MG tablet Commonly known as:  NORCO/VICODIN     TAKE these medications   acyclovir 400 MG  tablet Commonly known as:  ZOVIRAX TAKE 1 TABLET (400 MG TOTAL) BY MOUTH 2 (TWO) TIMES DAILY.   amiodarone 200 MG tablet Commonly known as:  PACERONE Take 0.5 tablets (100 mg total) by mouth daily.   atorvastatin 80 MG tablet Commonly known as:  LIPITOR Take 0.5 tablets (40 mg total) by mouth daily at 6 PM.   calcium carbonate 1250 (500 Ca) MG tablet Commonly known as:  OS-CAL - dosed in mg of elemental calcium Take 1 tablet by mouth daily with breakfast.   cyclophosphamide 50 MG capsule Commonly known as:  CYTOXAN Take 10 capsules ('500mg'$ ) by mouth once weekly for 3 weeks on, 1 week off, repeat every 4 weeks. Take early in the day. Maintain hydration.   dexamethasone 4 MG tablet Commonly known as:  DECADRON Take 5 tablets ('20mg'$ ) weekly day of chemotherapy   diltiazem 30 MG tablet Commonly known as:  CARDIZEM Take 1 tablet (30 mg total) by mouth every 12 (twelve) hours.   feeding supplement (ENSURE ENLIVE) Liqd Take 237 mLs by mouth 2 (two) times daily between meals. What changed:    when to take this  additional instructions   ferrous sulfate 325 (65 FE) MG tablet Take 1 tablet (325 mg total) by mouth daily with breakfast.   nitroGLYCERIN 0.4 MG SL tablet Commonly known as:  NITROSTAT Place 0.4 mg under the tongue every 5 (five) minutes as needed for chest pain.   oxyCODONE-acetaminophen 5-325 MG tablet Commonly known as:  PERCOCET/ROXICET Take 1 tablet by mouth every 4 (four) hours as needed for moderate pain or severe pain.   pantoprazole 40 MG tablet Commonly known as:  PROTONIX Take 1 tablet (40 mg total) by mouth 2 (two) times daily before a meal.   Potassium Chloride ER 20 MEQ Tbcr Commonly known as:  K-TAB Take 20 mEq by mouth daily.   pyridostigmine 60 MG tablet Commonly known as:  MESTINON Take 0.5 tablets (30 mg total) by mouth 2 (two) times daily.   tiZANidine 4 MG tablet Commonly known as:  ZANAFLEX Take 4 mg by mouth at bedtime as needed for  muscle spasms.      Allergies  Allergen Reactions  . Sulfa Antibiotics Rash   Follow-up Information    Abigail Ebbs, MD. Schedule an appointment as soon as possible for a visit in 2 week(s).   Specialty:  Internal Medicine Contact information: [address] [place] [address] [phone]        Doran Stabler, MD. Schedule an appointment as soon as possible for a visit in 2 week(s).   Specialty:  Gastroenterology Why:  f/u in 1-2 weeks. Contact information: [place] [address] [phone]            The results of significant diagnostics from this hospitalization (including imaging, microbiology, ancillary and laboratory) are listed below for reference.    Significant Diagnostic Studies: Ct Abdomen Pelvis Wo Contrast  Result Date: 10/22/2018 CLINICAL DATA:  Weakness, abdominal pain EXAM: CT ABDOMEN AND PELVIS WITHOUT CONTRAST TECHNIQUE: Multidetector CT imaging of the abdomen and pelvis was performed following the standard protocol without IV contrast. COMPARISON:  09/24/2018 FINDINGS: Lower chest: Bibasilar atelectasis. Normal heart size. Atherosclerosis noted. No pericardial or pleural effusion. No large hiatal hernia. Hepatobiliary: No focal liver abnormality is seen. No gallstones, gallbladder wall thickening, or biliary dilatation. Pancreas: Unremarkable. No pancreatic ductal dilatation or surrounding inflammatory changes. Spleen: Normal in size.  Accessory splenules noted. Adrenals/Urinary Tract: Normal adrenal glands. No acute renal obstruction, hydronephrosis, obstructing ureteral calculus, or UVJ abnormality. Bilateral renal artery stents noted. Similar hypodense and hyperdense left renal cysts are present, largest measures 2.2 cm laterally. Stomach/Bowel: Stomach and small bowel demonstrate no significant finding, ileus or obstruction pattern. Appendix not visualized with certainty. Wall thickening and surrounding edema noted of  the colon extending from the splenic flexure and the proximal first half of the left descending colon compatible with acute left colitis. Mild distention of the transverse colon may represent associated mild ileus related to the colitis. More distal sigmoid and rectum demonstrate diverticulosis without wall thickening. No evidence of perforation, abscess, hemorrhage or hematoma. Small amount of pelvic free fluid likely related to the colitis. Vascular/Lymphatic: Atherosclerosis of the aorta and abdominal vasculature. Negative for aneurysm. No adenopathy. Reproductive: Status post hysterectomy. No adnexal masses. Other: Intact abdominal wall. Negative for hernia. Trace pelvic ascites. Musculoskeletal: Degenerative changes noted of the spine. Stable retrolisthesis of T12 upon L1 and L1 upon L2 related to degenerative change. Stable myeloma changes at T12. No acute compression fracture or significant interval change. IMPRESSION: Acute left colitis extending from the splenic flexure and involving the proximal first half of the left descending colon which may be related to infectious/inflammatory etiology versus ischemic. No associated definite obstruction pattern, free air, or pneumatosis. Trace pelvic ascites, suspect related to the colitis. No fluid collection or abscess Electronically Signed   By: Jerilynn Mages.  Shick M.D.   On: 10/22/2018 11:32   Dg Chest Port 1 View  Result Date: 10/23/2018 CLINICAL DATA:  Acute onset of respiratory failure. EXAM: PORTABLE CHEST 1 VIEW COMPARISON:  Chest radiograph performed 10/22/2018 FINDINGS: The lungs are well-aerated. Vascular congestion is noted. Minimal bibasilar atelectasis is seen. There is no evidence of pleural effusion or pneumothorax. The cardiomediastinal silhouette is within normal limits. No acute osseous abnormalities are seen. IMPRESSION: Vascular congestion noted. Minimal bibasilar atelectasis seen. Electronically Signed   By: Garald Balding M.D.   On: 10/23/2018 04:52    Dg Chest Port 1 View  Result Date: 10/22/2018 CLINICAL DATA:  Chest pain, abdominal pain, weakness, history multiple myeloma, coronary artery disease post MI, hypertension, arrhythmia is, smoker EXAM: PORTABLE CHEST 1 VIEW COMPARISON:  Portable exam 0803 hours compared to 10/08/2018 FINDINGS: Upper normal heart size. Atherosclerotic calcification aorta. Mediastinal contours and pulmonary vascularity normal. Minimal bibasilar atelectasis. Lungs otherwise clear. No infiltrate, pleural effusion or pneumothorax. Inferior spur formation RIGHT acromion with adjacent nonspecific soft tissue calcification. IMPRESSION: Bibasilar atelectasis. Electronically Signed   By: Lavonia Dana M.D.   On: 10/22/2018 08:35   Dg Chest Port 1 View  Result Date: 10/08/2018 CLINICAL DATA:  Shortness of breath, palpitations, and vomiting since yesterday. Chest pain. Current smoker. EXAM: PORTABLE CHEST 1 VIEW COMPARISON:  1Jan 23, 202019 FINDINGS: Normal heart size and pulmonary vascularity. No focal airspace disease or consolidation in the lungs. No blunting of costophrenic angles. No pneumothorax. Mediastinal contours appear intact. Calcified and mildly tortuous aorta. IMPRESSION:  No active disease. Electronically Signed   By: Lucienne Capers M.D.   On: 10/08/2018 04:45   Dg Abd 2 Views  Result Date: 10/16/2018 CLINICAL DATA:  Abdominal pain. EXAM: ABDOMEN - 2 VIEW COMPARISON:  CT scan of September 24, 2018. Radiograph of December 08, 2016. FINDINGS: The bowel gas pattern is normal. There is no evidence of free air. Phleboliths are noted in the pelvis. IMPRESSION: No evidence of bowel obstruction or ileus. Electronically Signed   By: Marijo Conception, M.D.   On: 10/16/2018 20:57   [place] Ekg Site Rite  Result Date: 10/27/2018 If Site Rite image not attached, placement could not be confirmed due to current cardiac rhythm.   Microbiology: No results found for this or any previous visit (from the past 240 hour(s)).   Labs: Basic  Metabolic Panel: Recent Labs  Lab 10/27/18 0610  10/28/18 0308  10/29/18 0419 10/30/18 0200 10/31/18 0608 11/01/18 0751 11/02/18 0941  NA 149*   < > 150*   < > 142 142 145 146* 145  K 2.9*   < > 3.2*   < > 3.2* 3.9 4.3 3.2* 3.2*  CL 124*   < > 119*   < > 115* 113* 116* 115* 111  CO2 18*   < > 21*   < > 21* 22 20* 20* 22  GLUCOSE 82   < > 93   < > 107* 83 67* 70 84  BUN 17   < > 16   < > _0 7* 7*  CREATININE 1.57*   < > 1.58*   < > 1.52* 1.47* 1.25* 1.08* 1.00  CALCIUM 7.7*   < > 8.5*   < > 8.1* 8.0* 8.5* 8.5* 8.7*  MG 1.9  --  1.8  --  2.5*  --   --   --   --    < > = values in this interval not displayed.   Liver Function Tests: No results for input(s): AST, ALT, ALKPHOS, BILITOT, PROT, ALBUMIN in the last 168 hours. No results for input(s): LIPASE, AMYLASE in the last 168 hours. No results for input(s): AMMONIA in the last 168 hours. CBC: Recent Labs  Lab 10/27/18 0610 10/28/18 0308 10/29/18 0419 10/30/18 0200 11/01/18 0751  WBC 13.7* 13.0* 12.1* 12.0* 11.9*  NEUTROABS 10.2*  --  7.7  --   --   HGB 6.8* 8.9* 8.2* 8.3* 9.0*  HCT 23.6* 29.3* 26.7* 27.1* 30.4*  MCV 103.5* 94.8 98.2 97.8 98.7  PLT 155 172 148* 153 211   Cardiac Enzymes: No results for input(s): CKTOTAL, CKMB, CKMBINDEX, TROPONINI in the last 168 hours. BNP: BNP (last 3 results) Recent Labs    12/13/17 2243  BNP 994.5*    ProBNP (last 3 results) No results for input(s): PROBNP in the last 8760 hours.  CBG: No results for input(s): GLUCAP in the last 168 hours.     Signed:  Irine Seal MD.  [facility] 11/02/2018, 2:09 PM

## 2018-11-04 ENCOUNTER — Other Ambulatory Visit: Payer: Medicare HMO

## 2018-11-04 ENCOUNTER — Telehealth: Payer: Self-pay | Admitting: Nurse Practitioner

## 2018-11-04 ENCOUNTER — Inpatient Hospital Stay: Payer: Medicare HMO

## 2018-11-04 ENCOUNTER — Ambulatory Visit: Payer: Medicare HMO

## 2018-11-04 ENCOUNTER — Inpatient Hospital Stay: Payer: Medicare HMO | Admitting: Nurse Practitioner

## 2018-11-04 NOTE — Telephone Encounter (Signed)
Cancelled appt per 1/15 sch message - unable to reach patient .  R/s appts

## 2018-11-05 ENCOUNTER — Telehealth: Payer: Self-pay | Admitting: Oncology

## 2018-11-05 NOTE — Telephone Encounter (Signed)
Scheduled appt per 1/15 sch message - spoke with patient she is aware of appt date and time - reminder letter sent in the mail.

## 2018-11-10 ENCOUNTER — Telehealth: Payer: Self-pay

## 2018-11-10 NOTE — Telephone Encounter (Signed)
Received Voicemail from pt stating that she needs to cancel her appointment on 11/11/18 with Dr Benay Spice due to not being able to walk since being released from the hospital and transportation issue. Call back number [phone]

## 2018-11-11 ENCOUNTER — Inpatient Hospital Stay: Payer: Medicare HMO

## 2018-11-11 ENCOUNTER — Inpatient Hospital Stay: Payer: Medicare HMO | Admitting: Oncology

## 2018-11-11 ENCOUNTER — Other Ambulatory Visit: Payer: Self-pay | Admitting: *Deleted

## 2018-11-11 NOTE — Progress Notes (Signed)
Patient called office evening of 11/10/18 to cancel appointments of 11/11/18 due to lack of transportation.

## 2018-11-12 ENCOUNTER — Inpatient Hospital Stay (HOSPITAL_COMMUNITY): Payer: Medicare HMO

## 2018-11-12 ENCOUNTER — Other Ambulatory Visit: Payer: Self-pay

## 2018-11-12 ENCOUNTER — Emergency Department (HOSPITAL_COMMUNITY): Payer: Medicare HMO

## 2018-11-12 ENCOUNTER — Encounter (HOSPITAL_COMMUNITY): Payer: Self-pay | Admitting: *Deleted

## 2018-11-12 ENCOUNTER — Telehealth: Payer: Self-pay | Admitting: Oncology

## 2018-11-12 ENCOUNTER — Inpatient Hospital Stay (HOSPITAL_COMMUNITY)
Admission: EM | Admit: 2018-11-12 | Discharge: 2018-12-03 | DRG: 308 | Disposition: A | Discharge status: Hospice | Payer: Medicare HMO | Attending: Internal Medicine | Admitting: Internal Medicine

## 2018-11-12 DIAGNOSIS — I081 Rheumatic disorders of both mitral and tricuspid valves: Secondary | ICD-10-CM | POA: Diagnosis present

## 2018-11-12 DIAGNOSIS — S2249XA Multiple fractures of ribs, unspecified side, initial encounter for closed fracture: Secondary | ICD-10-CM | POA: Diagnosis not present

## 2018-11-12 DIAGNOSIS — G931 Anoxic brain damage, not elsewhere classified: Secondary | ICD-10-CM | POA: Diagnosis present

## 2018-11-12 DIAGNOSIS — I252 Old myocardial infarction: Secondary | ICD-10-CM

## 2018-11-12 DIAGNOSIS — Z452 Encounter for adjustment and management of vascular access device: Secondary | ICD-10-CM | POA: Diagnosis not present

## 2018-11-12 DIAGNOSIS — Z7189 Other specified counseling: Secondary | ICD-10-CM

## 2018-11-12 DIAGNOSIS — I4891 Unspecified atrial fibrillation: Secondary | ICD-10-CM | POA: Diagnosis not present

## 2018-11-12 DIAGNOSIS — N183 Chronic kidney disease, stage 3 (moderate): Secondary | ICD-10-CM | POA: Diagnosis present

## 2018-11-12 DIAGNOSIS — I4901 Ventricular fibrillation: Secondary | ICD-10-CM | POA: Diagnosis not present

## 2018-11-12 DIAGNOSIS — R0989 Other specified symptoms and signs involving the circulatory and respiratory systems: Secondary | ICD-10-CM

## 2018-11-12 DIAGNOSIS — D631 Anemia in chronic kidney disease: Secondary | ICD-10-CM | POA: Diagnosis not present

## 2018-11-12 DIAGNOSIS — D638 Anemia in other chronic diseases classified elsewhere: Secondary | ICD-10-CM | POA: Diagnosis present

## 2018-11-12 DIAGNOSIS — Z66 Do not resuscitate: Secondary | ICD-10-CM | POA: Diagnosis not present

## 2018-11-12 DIAGNOSIS — I495 Sick sinus syndrome: Secondary | ICD-10-CM | POA: Diagnosis present

## 2018-11-12 DIAGNOSIS — E876 Hypokalemia: Secondary | ICD-10-CM | POA: Diagnosis present

## 2018-11-12 DIAGNOSIS — J9601 Acute respiratory failure with hypoxia: Secondary | ICD-10-CM

## 2018-11-12 DIAGNOSIS — E440 Moderate protein-calorie malnutrition: Secondary | ICD-10-CM | POA: Diagnosis not present

## 2018-11-12 DIAGNOSIS — Z7901 Long term (current) use of anticoagulants: Secondary | ICD-10-CM | POA: Diagnosis not present

## 2018-11-12 DIAGNOSIS — J189 Pneumonia, unspecified organism: Secondary | ICD-10-CM

## 2018-11-12 DIAGNOSIS — Z515 Encounter for palliative care: Secondary | ICD-10-CM

## 2018-11-12 DIAGNOSIS — E872 Acidosis: Secondary | ICD-10-CM | POA: Diagnosis present

## 2018-11-12 DIAGNOSIS — I462 Cardiac arrest due to underlying cardiac condition: Secondary | ICD-10-CM | POA: Diagnosis present

## 2018-11-12 DIAGNOSIS — I2510 Atherosclerotic heart disease of native coronary artery without angina pectoris: Secondary | ICD-10-CM | POA: Diagnosis present

## 2018-11-12 DIAGNOSIS — I82411 Acute embolism and thrombosis of right femoral vein: Secondary | ICD-10-CM | POA: Diagnosis present

## 2018-11-12 DIAGNOSIS — J9611 Chronic respiratory failure with hypoxia: Secondary | ICD-10-CM | POA: Diagnosis not present

## 2018-11-12 DIAGNOSIS — I472 Ventricular tachycardia: Secondary | ICD-10-CM | POA: Diagnosis present

## 2018-11-12 DIAGNOSIS — R64 Cachexia: Secondary | ICD-10-CM | POA: Diagnosis present

## 2018-11-12 DIAGNOSIS — F17210 Nicotine dependence, cigarettes, uncomplicated: Secondary | ICD-10-CM | POA: Diagnosis present

## 2018-11-12 DIAGNOSIS — I248 Other forms of acute ischemic heart disease: Secondary | ICD-10-CM | POA: Diagnosis present

## 2018-11-12 DIAGNOSIS — B370 Candidal stomatitis: Secondary | ICD-10-CM

## 2018-11-12 DIAGNOSIS — Z955 Presence of coronary angioplasty implant and graft: Secondary | ICD-10-CM

## 2018-11-12 DIAGNOSIS — C9000 Multiple myeloma not having achieved remission: Secondary | ICD-10-CM | POA: Diagnosis present

## 2018-11-12 DIAGNOSIS — I82409 Acute embolism and thrombosis of unspecified deep veins of unspecified lower extremity: Secondary | ICD-10-CM | POA: Diagnosis not present

## 2018-11-12 DIAGNOSIS — D62 Acute posthemorrhagic anemia: Secondary | ICD-10-CM | POA: Diagnosis not present

## 2018-11-12 DIAGNOSIS — I130 Hypertensive heart and chronic kidney disease with heart failure and stage 1 through stage 4 chronic kidney disease, or unspecified chronic kidney disease: Secondary | ICD-10-CM | POA: Diagnosis present

## 2018-11-12 DIAGNOSIS — Z6827 Body mass index (BMI) 27.0-27.9, adult: Secondary | ICD-10-CM

## 2018-11-12 DIAGNOSIS — Z882 Allergy status to sulfonamides status: Secondary | ICD-10-CM

## 2018-11-12 DIAGNOSIS — G7000 Myasthenia gravis without (acute) exacerbation: Secondary | ICD-10-CM | POA: Diagnosis not present

## 2018-11-12 DIAGNOSIS — Z8674 Personal history of sudden cardiac arrest: Secondary | ICD-10-CM | POA: Diagnosis not present

## 2018-11-12 DIAGNOSIS — I82401 Acute embolism and thrombosis of unspecified deep veins of right lower extremity: Secondary | ICD-10-CM | POA: Diagnosis not present

## 2018-11-12 DIAGNOSIS — I441 Atrioventricular block, second degree: Secondary | ICD-10-CM | POA: Diagnosis not present

## 2018-11-12 DIAGNOSIS — Z90710 Acquired absence of both cervix and uterus: Secondary | ICD-10-CM

## 2018-11-12 DIAGNOSIS — J90 Pleural effusion, not elsewhere classified: Secondary | ICD-10-CM | POA: Diagnosis not present

## 2018-11-12 DIAGNOSIS — I480 Paroxysmal atrial fibrillation: Secondary | ICD-10-CM | POA: Diagnosis present

## 2018-11-12 DIAGNOSIS — R630 Anorexia: Secondary | ICD-10-CM | POA: Diagnosis not present

## 2018-11-12 DIAGNOSIS — E785 Hyperlipidemia, unspecified: Secondary | ICD-10-CM | POA: Diagnosis present

## 2018-11-12 DIAGNOSIS — I469 Cardiac arrest, cause unspecified: Secondary | ICD-10-CM

## 2018-11-12 DIAGNOSIS — N179 Acute kidney failure, unspecified: Secondary | ICD-10-CM

## 2018-11-12 DIAGNOSIS — I82431 Acute embolism and thrombosis of right popliteal vein: Secondary | ICD-10-CM | POA: Diagnosis present

## 2018-11-12 DIAGNOSIS — R627 Adult failure to thrive: Secondary | ICD-10-CM | POA: Diagnosis present

## 2018-11-12 DIAGNOSIS — F329 Major depressive disorder, single episode, unspecified: Secondary | ICD-10-CM | POA: Diagnosis present

## 2018-11-12 DIAGNOSIS — R739 Hyperglycemia, unspecified: Secondary | ICD-10-CM | POA: Diagnosis present

## 2018-11-12 DIAGNOSIS — I5023 Acute on chronic systolic (congestive) heart failure: Secondary | ICD-10-CM | POA: Diagnosis not present

## 2018-11-12 DIAGNOSIS — E875 Hyperkalemia: Secondary | ICD-10-CM | POA: Diagnosis not present

## 2018-11-12 DIAGNOSIS — G9341 Metabolic encephalopathy: Secondary | ICD-10-CM | POA: Diagnosis not present

## 2018-11-12 DIAGNOSIS — K7200 Acute and subacute hepatic failure without coma: Secondary | ICD-10-CM | POA: Diagnosis not present

## 2018-11-12 DIAGNOSIS — D630 Anemia in neoplastic disease: Secondary | ICD-10-CM | POA: Diagnosis not present

## 2018-11-12 DIAGNOSIS — K210 Gastro-esophageal reflux disease with esophagitis: Secondary | ICD-10-CM | POA: Diagnosis present

## 2018-11-12 DIAGNOSIS — I471 Supraventricular tachycardia: Secondary | ICD-10-CM | POA: Diagnosis not present

## 2018-11-12 DIAGNOSIS — E870 Hyperosmolality and hypernatremia: Secondary | ICD-10-CM | POA: Diagnosis not present

## 2018-11-12 DIAGNOSIS — Z9289 Personal history of other medical treatment: Secondary | ICD-10-CM

## 2018-11-12 DIAGNOSIS — R1311 Dysphagia, oral phase: Secondary | ICD-10-CM | POA: Diagnosis not present

## 2018-11-12 DIAGNOSIS — R54 Age-related physical debility: Secondary | ICD-10-CM | POA: Diagnosis present

## 2018-11-12 DIAGNOSIS — R011 Cardiac murmur, unspecified: Secondary | ICD-10-CM | POA: Diagnosis present

## 2018-11-12 DIAGNOSIS — Z8719 Personal history of other diseases of the digestive system: Secondary | ICD-10-CM

## 2018-11-12 DIAGNOSIS — Z803 Family history of malignant neoplasm of breast: Secondary | ICD-10-CM

## 2018-11-12 DIAGNOSIS — R7989 Other specified abnormal findings of blood chemistry: Secondary | ICD-10-CM

## 2018-11-12 DIAGNOSIS — E871 Hypo-osmolality and hyponatremia: Secondary | ICD-10-CM | POA: Diagnosis not present

## 2018-11-12 DIAGNOSIS — J810 Acute pulmonary edema: Secondary | ICD-10-CM | POA: Diagnosis not present

## 2018-11-12 DIAGNOSIS — I255 Ischemic cardiomyopathy: Secondary | ICD-10-CM | POA: Diagnosis present

## 2018-11-12 DIAGNOSIS — Z9841 Cataract extraction status, right eye: Secondary | ICD-10-CM

## 2018-11-12 DIAGNOSIS — Z961 Presence of intraocular lens: Secondary | ICD-10-CM | POA: Diagnosis present

## 2018-11-12 DIAGNOSIS — B379 Candidiasis, unspecified: Secondary | ICD-10-CM | POA: Diagnosis not present

## 2018-11-12 DIAGNOSIS — Z8669 Personal history of other diseases of the nervous system and sense organs: Secondary | ICD-10-CM | POA: Diagnosis not present

## 2018-11-12 DIAGNOSIS — R778 Other specified abnormalities of plasma proteins: Secondary | ICD-10-CM

## 2018-11-12 LAB — I-STAT TROPONIN, ED: Troponin i, poc: 0.22 ng/mL (ref 0.00–0.08)

## 2018-11-12 LAB — BASIC METABOLIC PANEL
Anion gap: 21 — ABNORMAL HIGH (ref 5–15)
Anion gap: 14 (ref 5–15)
BUN: 10 mg/dL (ref 8–23)
BUN: 8 mg/dL (ref 8–23)
CO2: 15 mmol/L — ABNORMAL LOW (ref 22–32)
CO2: 20 mmol/L — AB (ref 22–32)
CREATININE: 1.43 mg/dL — AB (ref 0.44–1.00)
Calcium: 8.2 mg/dL — ABNORMAL LOW (ref 8.9–10.3)
Calcium: 8.1 mg/dL — ABNORMAL LOW (ref 8.9–10.3)
Chloride: 104 mmol/L (ref 98–111)
Chloride: 108 mmol/L (ref 98–111)
Creatinine, Ser: 1.13 mg/dL — ABNORMAL HIGH (ref 0.44–1.00)
GFR calc Af Amer: 43 mL/min — ABNORMAL LOW (ref >60)
GFR calc Af Amer: 57 mL/min — ABNORMAL LOW (ref >60)
GFR calc non Af Amer: 37 mL/min — ABNORMAL LOW (ref >60)
GFR calc non Af Amer: 49 mL/min — ABNORMAL LOW (ref >60)
Glucose, Bld: 289 mg/dL — ABNORMAL HIGH (ref 70–99)
Glucose, Bld: 84 mg/dL (ref 70–99)
Potassium: 2.8 mmol/L — ABNORMAL LOW (ref 3.5–5.1)
Potassium: 3.7 mmol/L (ref 3.5–5.1)
Sodium: 140 mmol/L (ref 135–145)
Sodium: 142 mmol/L (ref 135–145)

## 2018-11-12 LAB — COMPREHENSIVE METABOLIC PANEL
ALBUMIN: 3.2 g/dL — AB (ref 3.5–5.0)
ALT: 68 U/L — AB (ref 0–44)
ALT: 66 U/L — ABNORMAL HIGH (ref 0–44)
AST: 140 U/L — ABNORMAL HIGH (ref 15–41)
AST: 150 U/L — ABNORMAL HIGH (ref 15–41)
Albumin: 3.6 g/dL (ref 3.5–5.0)
Alkaline Phosphatase: 68 U/L (ref 38–126)
Alkaline Phosphatase: 66 U/L (ref 38–126)
Anion gap: 25 — ABNORMAL HIGH (ref 5–15)
Anion gap: 15 (ref 5–15)
BILIRUBIN TOTAL: 1.3 mg/dL — AB (ref 0.3–1.2)
BUN: 7 mg/dL — ABNORMAL LOW (ref 8–23)
BUN: 9 mg/dL (ref 8–23)
CO2: 13 mmol/L — ABNORMAL LOW (ref 22–32)
CO2: 19 mmol/L — ABNORMAL LOW (ref 22–32)
Calcium: 8.7 mg/dL — ABNORMAL LOW (ref 8.9–10.3)
Calcium: 8.1 mg/dL — ABNORMAL LOW (ref 8.9–10.3)
Chloride: 102 mmol/L (ref 98–111)
Chloride: 106 mmol/L (ref 98–111)
Creatinine, Ser: 1.42 mg/dL — ABNORMAL HIGH (ref 0.44–1.00)
Creatinine, Ser: 1.29 mg/dL — ABNORMAL HIGH (ref 0.44–1.00)
GFR calc Af Amer: 43 mL/min — ABNORMAL LOW (ref >60)
GFR calc Af Amer: 48 mL/min — ABNORMAL LOW (ref >60)
GFR calc non Af Amer: 37 mL/min — ABNORMAL LOW (ref >60)
GFR calc non Af Amer: 42 mL/min — ABNORMAL LOW (ref >60)
Glucose, Bld: 259 mg/dL — ABNORMAL HIGH (ref 70–99)
Glucose, Bld: 154 mg/dL — ABNORMAL HIGH (ref 70–99)
Potassium: 2.1 mmol/L — CL (ref 3.5–5.1)
Potassium: 3.4 mmol/L — ABNORMAL LOW (ref 3.5–5.1)
Sodium: 140 mmol/L (ref 135–145)
Sodium: 140 mmol/L (ref 135–145)
Total Bilirubin: 1.5 mg/dL — ABNORMAL HIGH (ref 0.3–1.2)
Total Protein: 7.1 g/dL (ref 6.5–8.1)
Total Protein: 6.6 g/dL (ref 6.5–8.1)

## 2018-11-12 LAB — MAGNESIUM: Magnesium: 1.2 mg/dL — ABNORMAL LOW (ref 1.7–2.4)

## 2018-11-12 LAB — GLUCOSE, CAPILLARY
Glucose-Capillary: 287 mg/dL — ABNORMAL HIGH (ref 70–99)
Glucose-Capillary: 174 mg/dL — ABNORMAL HIGH (ref 70–99)
Glucose-Capillary: 143 mg/dL — ABNORMAL HIGH (ref 70–99)
Glucose-Capillary: 97 mg/dL (ref 70–99)
Glucose-Capillary: 82 mg/dL (ref 70–99)
Glucose-Capillary: 73 mg/dL (ref 70–99)
Glucose-Capillary: 99 mg/dL (ref 70–99)

## 2018-11-12 LAB — CBC
HCT: 36.1 % (ref 36.0–46.0)
Hemoglobin: 10.3 g/dL — ABNORMAL LOW (ref 12.0–15.0)
MCH: 29.3 pg (ref 26.0–34.0)
MCHC: 28.5 g/dL — ABNORMAL LOW (ref 30.0–36.0)
MCV: 102.6 fL — ABNORMAL HIGH (ref 80.0–100.0)
Platelets: 288 10*3/uL (ref 150–400)
RBC: 3.52 MIL/uL — AB (ref 3.87–5.11)
RDW: 19.5 % — ABNORMAL HIGH (ref 11.5–15.5)
WBC: 12.3 10*3/uL — ABNORMAL HIGH (ref 4.0–10.5)
nRBC: 0.2 % (ref 0.0–0.2)

## 2018-11-12 LAB — PROTIME-INR
INR: 1.15
PROTHROMBIN TIME: 14.6 s (ref 11.4–15.2)

## 2018-11-12 LAB — TROPONIN I
Troponin I: 0.65 ng/mL (ref <0.03)
Troponin I: 0.8 ng/mL (ref <0.03)

## 2018-11-12 LAB — APTT: aPTT: 29 seconds (ref 24–36)

## 2018-11-12 MED ORDER — ARTIFICIAL TEARS OPHTHALMIC OINT
1.0000 "application " | TOPICAL_OINTMENT | Freq: Three times a day (TID) | OPHTHALMIC | Status: DC
  Administered 2018-11-12 – 2018-11-14 (×6): 1 via OPHTHALMIC
  Filled 2018-11-12: qty 3.5

## 2018-11-12 MED ORDER — FENTANYL 2500MCG IN NS 250ML (10MCG/ML) PREMIX INFUSION
100.0000 ug/h | INTRAVENOUS | Status: DC
  Administered 2018-11-12 – 2018-11-13 (×2): 100 ug/h via INTRAVENOUS
  Administered 2018-11-14: 25 ug/h via INTRAVENOUS
  Filled 2018-11-12 (×3): qty 250

## 2018-11-12 MED ORDER — INSULIN ASPART 100 UNIT/ML ~~LOC~~ SOLN
0.0000 [IU] | SUBCUTANEOUS | Status: DC
  Administered 2018-11-12: 8 [IU] via SUBCUTANEOUS

## 2018-11-12 MED ORDER — FENTANYL BOLUS VIA INFUSION
25.0000 ug | INTRAVENOUS | Status: DC | PRN
  Administered 2018-11-15: 25 ug via INTRAVENOUS
  Filled 2018-11-12: qty 25

## 2018-11-12 MED ORDER — SODIUM CHLORIDE 0.9 % IV SOLN
1.0000 ug/kg/min | INTRAVENOUS | Status: DC
  Filled 2018-11-12: qty 20

## 2018-11-12 MED ORDER — DEXTROSE-NACL 5-0.9 % IV SOLN
INTRAVENOUS | Status: DC
  Administered 2018-11-12: 23:00:00 via INTRAVENOUS

## 2018-11-12 MED ORDER — SODIUM CHLORIDE 0.9 % IV BOLUS
500.0000 mL | Freq: Once | INTRAVENOUS | Status: AC
  Administered 2018-11-12: 500 mL via INTRAVENOUS

## 2018-11-12 MED ORDER — CISATRACURIUM BOLUS VIA INFUSION
0.1000 mg/kg | Freq: Once | INTRAVENOUS | Status: AC
  Administered 2018-11-12: 7.4 mg via INTRAVENOUS
  Filled 2018-11-12: qty 8

## 2018-11-12 MED ORDER — POTASSIUM CHLORIDE 10 MEQ/50ML IV SOLN
10.0000 meq | INTRAVENOUS | Status: AC
  Administered 2018-11-12 (×6): 10 meq via INTRAVENOUS
  Filled 2018-11-12 (×6): qty 50

## 2018-11-12 MED ORDER — ORAL CARE MOUTH RINSE
15.0000 mL | OROMUCOSAL | Status: DC
  Administered 2018-11-13 – 2018-11-15 (×26): 15 mL via OROMUCOSAL

## 2018-11-12 MED ORDER — SODIUM CHLORIDE 0.9 % IV SOLN
1.0000 ug/kg/min | INTRAVENOUS | Status: DC
  Administered 2018-11-12: 1 ug/kg/min via INTRAVENOUS
  Filled 2018-11-12: qty 20

## 2018-11-12 MED ORDER — PANTOPRAZOLE SODIUM 40 MG IV SOLR
40.0000 mg | Freq: Every day | INTRAVENOUS | Status: DC
  Administered 2018-11-12 – 2018-11-15 (×4): 40 mg via INTRAVENOUS
  Filled 2018-11-12 (×4): qty 40

## 2018-11-12 MED ORDER — CHLORHEXIDINE GLUCONATE 0.12% ORAL RINSE (MEDLINE KIT)
15.0000 mL | Freq: Two times a day (BID) | OROMUCOSAL | Status: DC
  Administered 2018-11-12 – 2018-11-15 (×6): 15 mL via OROMUCOSAL

## 2018-11-12 MED ORDER — PYRIDOSTIGMINE BROMIDE 60 MG/5ML PO SOLN: 30.0000 mg | Freq: Two times a day (BID) | ORAL | Status: DC

## 2018-11-12 MED ORDER — SODIUM CHLORIDE 0.9 % IV SOLN
INTRAVENOUS | Status: DC
  Administered 2018-11-12: 16:00:00 via INTRAVENOUS

## 2018-11-12 MED ORDER — AMIODARONE HCL IN DEXTROSE 360-4.14 MG/200ML-% IV SOLN
30.0000 mg/h | INTRAVENOUS | Status: DC
  Administered 2018-11-12 – 2018-11-14 (×6): 30 mg/h via INTRAVENOUS
  Administered 2018-11-15: 20 mg/h via INTRAVENOUS
  Administered 2018-11-15: 30 mg/h via INTRAVENOUS
  Filled 2018-11-12 (×8): qty 200

## 2018-11-12 MED ORDER — FENTANYL CITRATE (PF) 100 MCG/2ML IJ SOLN
50.0000 ug | Freq: Once | INTRAMUSCULAR | Status: DC
  Filled 2018-11-12: qty 2

## 2018-11-12 MED ORDER — NOREPINEPHRINE-SODIUM CHLORIDE 4-0.9 MG/250ML-% IV SOLN: 0.0000 ug/min | INTRAVENOUS | Status: DC

## 2018-11-12 MED ORDER — ASPIRIN 300 MG RE SUPP
300.0000 mg | RECTAL | Status: AC
  Administered 2018-11-12: 300 mg via RECTAL
  Filled 2018-11-12: qty 1

## 2018-11-12 MED ORDER — MIDAZOLAM 50MG/50ML (1MG/ML) PREMIX INFUSION
1.0000 mg/h | INTRAVENOUS | Status: DC
  Filled 2018-11-12: qty 50

## 2018-11-12 MED ORDER — POTASSIUM CHLORIDE 10 MEQ/50ML IV SOLN
10.0000 meq | INTRAVENOUS | Status: AC
  Administered 2018-11-12 – 2018-11-13 (×4): 10 meq via INTRAVENOUS
  Filled 2018-11-12 (×4): qty 50

## 2018-11-12 MED ORDER — PROPOFOL 1000 MG/100ML IV EMUL
25.0000 ug/kg/min | INTRAVENOUS | Status: DC
  Administered 2018-11-12 (×2): 30 ug/kg/min via INTRAVENOUS
  Administered 2018-11-13 (×2): 35 ug/kg/min via INTRAVENOUS
  Administered 2018-11-13: 25 ug/kg/min via INTRAVENOUS
  Administered 2018-11-13: 35 ug/kg/min via INTRAVENOUS
  Administered 2018-11-14 (×2): 30 ug/kg/min via INTRAVENOUS
  Filled 2018-11-12 (×8): qty 100

## 2018-11-12 MED ORDER — FENTANYL 2500MCG IN NS 250ML (10MCG/ML) PREMIX INFUSION: 25.0000 ug/h | INTRAVENOUS | Status: DC

## 2018-11-12 MED ORDER — NOREPINEPHRINE-SODIUM CHLORIDE 4-0.9 MG/250ML-% IV SOLN
0.0000 ug/min | INTRAVENOUS | Status: DC
  Administered 2018-11-12 – 2018-11-13 (×2): 5 ug/min via INTRAVENOUS
  Administered 2018-11-13: 7 ug/min via INTRAVENOUS
  Administered 2018-11-14: 11 ug/min via INTRAVENOUS
  Administered 2018-11-14: 4 ug/min via INTRAVENOUS
  Administered 2018-11-14: 12 ug/min via INTRAVENOUS
  Filled 2018-11-12 (×7): qty 250

## 2018-11-12 MED ORDER — HEPARIN SODIUM (PORCINE) 5000 UNIT/ML IJ SOLN
5000.0000 [IU] | Freq: Three times a day (TID) | INTRAMUSCULAR | Status: DC
  Administered 2018-11-12 – 2018-11-15 (×9): 5000 [IU] via SUBCUTANEOUS
  Filled 2018-11-12 (×9): qty 1

## 2018-11-12 MED ORDER — HYDRALAZINE HCL 20 MG/ML IJ SOLN
10.0000 mg | Freq: Four times a day (QID) | INTRAMUSCULAR | Status: DC | PRN
  Administered 2018-11-12: 10 mg via INTRAVENOUS
  Filled 2018-11-12: qty 1

## 2018-11-12 MED ORDER — CISATRACURIUM BOLUS VIA INFUSION
0.0500 mg/kg | INTRAVENOUS | Status: DC | PRN
  Filled 2018-11-12: qty 4

## 2018-11-12 MED ORDER — PYRIDOSTIGMINE BROMIDE 60 MG PO TABS: 30.0000 mg | ORAL_TABLET | Freq: Two times a day (BID) | ORAL | Status: DC

## 2018-11-12 MED ORDER — POTASSIUM CHLORIDE 20 MEQ/15ML (10%) PO SOLN
40.0000 meq | Freq: Two times a day (BID) | ORAL | Status: DC
  Administered 2018-11-12: 40 meq
  Filled 2018-11-12: qty 30

## 2018-11-12 NOTE — Procedures (Signed)
History: 73 yo F s/p cardiac arrest  Sedation: Propofol  Technique: This is a 21 channel routine scalp EEG performed at the bedside with bipolar and monopolar montages arranged in accordance to the international 10/20 system of electrode placement. One channel was dedicated to EKG recording.    Background: The background is attenuates, but there is some low voltage generalized irregular delta activity with intermittent runs of alpha resembling sleep structures. There was no epileptiform discharge  Photic stimulation: Physiologic driving is Not performed  EEG Abnormalities: 1) Attenuated EEG  Clinical Interpretation: This EEG is suggestive of a cortical dysfunction, though in the current sedated state, I do not think that there is significant prognostic value to this EEG.   There was no seizure or seizure predisposition recorded on this study.   Roland Rack, MD [facility] [phone]  If 7pm- 7am, please page neurology on call as listed in [place].

## 2018-11-12 NOTE — ED Notes (Signed)
[facility] called to activate code cool

## 2018-11-12 NOTE — ED Notes (Signed)
Abigail [name]  At bedside, talking to sister, ice packs applied to patient . Radiology called and stated needed to insert OG 12-15 cm.  Dr. [place] Constable at bedside.

## 2018-11-12 NOTE — ED Notes (Signed)
K 2.1 given to [place]

## 2018-11-12 NOTE — ED Provider Notes (Signed)
[facility] EMERGENCY DEPARTMENT Provider Note   CSN: [id number] Arrival date & time: 11/12/18  1142     History   Chief Complaint Chief Complaint  Patient presents with  . Cardiac Arrest    HPI Abigail [name] is a 73 y.o. female.  Patient presents via EMS s/p cardiac arrest. Pt was talking on phone, her sister heard pt abruptly stop talking and found patient unresponsive on couch. First responder placed pt on pads, noted to be vfib, was defib x 2 with return of pulses. EMS placed ETT.  Pt unresponsive - level 5 caveat.   The history is provided by the patient and the EMS personnel. The history is limited by the condition of the patient.  Cardiac Arrest    Past Medical History:  Diagnosis Date  . ACS (acute coronary syndrome) ([place]) 06/2017  . Anemia    "off and on all my life" (12/22/2016)  . Anxiety   . Bilateral renal artery stenosis ([place]) 1999   s/p stenting  . Chronic kidney disease (CKD), stage III (moderate) ([place])    Archie Endo 12/22/2016  . Coronary artery disease 1999  . Depression   . Diverticulosis   . Duodenitis   . DVT (deep venous thrombosis) (H[facility])    on Coumadin  . Dyspnea   . Gastric erosions   . Gastric ulcer   . GERD (gastroesophageal reflux disease)   . Heart murmur   . History of blood transfusion ~ 03/2016   "low HgB; practically nonexistent"  . Hyperlipidemia   . Hypertension   . MI (myocardial infarction) ([place]) 1999  . Multiple myeloma ([place]) dx'd ~ 04/2016  . PAT (paroxysmal atrial tachycardia) ([place]) 12/22/2016  . Retinal hemorrhage, right eye   . Schatzki's ring   . Tachy-brady syndrome ([place])    Archie Endo 12/22/2016  . Tubular adenoma of colon     Patient Active Problem List   Diagnosis Date Noted  . Hypotension   . Septic shock ([place]) 10/23/2018  . Acute kidney injury superimposed on chronic kidney disease ([place]) 10/22/2018  . Colitis 10/22/2018  . Bradycardia 10/22/2018  . AF (paroxysmal atrial fibrillation) ([place]) 10/22/2018  .  SVT (supraventricular tachycardia) ([place]) 10/08/2018  . Atrial fibrillation with RVR ([place]) [id number]  . Precordial chest pain 09/15/2018  . Multiple myeloma in relapse ([place]) 06/15/2018  . Goals of care, counseling/discussion 06/15/2018  . Hematochezia   . Acute blood loss anemia   . Diverticulosis of colon with hemorrhage   . GIB (gastrointestinal bleeding) 09/03/2017  . Symptomatic anemia 09/03/2017  . Acute systolic heart failure ([place]) 09/03/2017  . Chronic kidney disease (CKD), stage III (moderate) ([place]) 09/03/2017  . Hyperlipidemia 09/03/2017  . Multiple myeloma in remission ([place]) 09/03/2017  . GI bleed 08/17/2017  . Sepsis ([place]) 08/17/2017  . Fever 08/16/2017  . STEMI (ST elevation myocardial infarction) ([place]) 02/24/2017  . Acute anterolateral wall MI ([place]) 02/24/2017  . Dehydration 01/21/2017  . Acute coronary syndrome ([place]) 12/24/2016  . Dizziness 12/22/2016    Class: Acute  . Orthostatic hypotension 12/08/2016  . Syncope and collapse 12/06/2016  . Tachycardia-bradycardia ([place])   . Essential hypertension   . Nausea and vomiting in adult patient   . Erosive esophagitis   . Protein-calorie malnutrition, severe 11/19/2016  . LLQ pain   . LUQ abdominal pain   . Intractable nausea and vomiting 11/17/2016  . Abdominal pain 11/05/2016  . Multiple myeloma ([place]) 06/18/2016  . Malnutrition of moderate degree 06/14/2016  . Iron  deficiency anemia secondary to blood loss (chronic) 01/11/2016  . Exertional dyspnea 11/01/2015  . Coronary atherosclerosis of native coronary artery 11/01/2015  . Shortness of breath 11/01/2015  . Chest pain 12/31/2011  . Coronary artery disease 10/20/1997    Past Surgical History:  Procedure Laterality Date  . APPENDECTOMY    . BACK SURGERY    . CATARACT EXTRACTION W/ INTRAOCULAR LENS IMPLANT Right 2014  . COLONOSCOPY WITH PROPOFOL N/A 09/04/2017   Procedure: COLONOSCOPY WITH PROPOFOL;  Surgeon: Doran Stabler, MD;  Location: [facility];   Service: Gastroenterology;  Laterality: N/A;  . CORONARY ANGIOGRAPHY N/A 01/23/2017   Procedure: Coronary Angiography;  Surgeon: [name], MD;  Location: [facility];  Service: Cardiovascular;  Laterality: N/A;  . CORONARY ANGIOPLASTY WITH STENT PLACEMENT  1999  . CORONARY STENT INTERVENTION N/A 12/29/2016   Procedure: Coronary Stent Intervention;  Surgeon: Charolette Forward, MD;  Location: [facility];  Service: Cardiovascular;  Laterality: N/A;  . CORONARY STENT INTERVENTION N/A 02/24/2017   Procedure: Coronary Stent Intervention;  Surgeon: Charolette Forward, MD;  Location: [facility];  Service: Cardiovascular;  Laterality: N/A;  . ESOPHAGOGASTRODUODENOSCOPY N/A 11/03/2015   Procedure: ESOPHAGOGASTRODUODENOSCOPY (EGD);  Surgeon: Carol Ada, MD;  Location[facility] ENDOSCOPY;  Service: Endoscopy;  Laterality: N/A;  . ESOPHAGOGASTRODUODENOSCOPY N/A 09/03/2017   Procedure: ESOPHAGOGASTRODUODENOSCOPY (EGD);  Surgeon: Doran Stabler, MD;  Location: [facility];  Service: Gastroenterology;  Laterality: N/A;  . ESOPHAGOGASTRODUODENOSCOPY (EGD) WITH PROPOFOL N/A 11/18/2016   Procedure: ESOPHAGOGASTRODUODENOSCOPY (EGD) WITH PROPOFOL;  Surgeon: Ladene Artist, MD;  Location: [facility] ENDOSCOPY;  Service: Endoscopy;  Laterality: N/A;  . LEFT HEART CATH AND CORONARY ANGIOGRAPHY N/A 12/24/2016   Procedure: Left Heart Cath and Coronary Angiography;  Surgeon: [name], MD;  Location: [facility];  Service: Cardiovascular;  Laterality: N/A;  . LEFT HEART CATH AND CORONARY ANGIOGRAPHY N/A 02/24/2017   Procedure: Left Heart Cath and Coronary Angiography;  Surgeon: Charolette Forward, MD;  Location: [facility];  Service: Cardiovascular;  Laterality: N/A;  . LEFT HEART CATH AND CORONARY ANGIOGRAPHY N/A 03/05/2017   Procedure: Left Heart Cath and Coronary Angiography;  Surgeon: [name], MD;  Location: [facility];  Service: Cardiovascular;  Laterality: N/A;  . LEFT HEART CATH AND  CORONARY ANGIOGRAPHY N/A 09/02/2018   Procedure: LEFT HEART CATH AND CORONARY ANGIOGRAPHY;  Surgeon: [name], MD;  Location: [facility];  Service: Cardiovascular;  Laterality: N/A;  . LUMBAR [place]    . RENAL ARTERY STENT  1999   bil RAS so ? bil vs unilateral stents  . RETINAL LASER PROCEDURE Right 2012   "bleeding"  . TONSILLECTOMY    . TOTAL ABDOMINAL HYSTERECTOMY       OB History   No obstetric history on file.      Home Medications    Prior to Admission medications   Medication Sig Start Date End Date Taking? Authorizing Provider  acyclovir (ZOVIRAX) 400 MG tablet TAKE 1 TABLET (400 MG TOTAL) BY MOUTH 2 (TWO) TIMES DAILY. 08/29/16   Ladell Pier, MD  amiodarone (PACERONE) 200 MG tablet Take 0.5 tablets (100 mg total) by mouth daily. 10/19/18   [name], MD  atorvastatin (LIPITOR) 80 MG tablet Take 0.5 tablets (40 mg total) by mouth daily at 6 PM. 10/03/18   Charolette Forward, MD  calcium carbonate (OS-CAL - DOSED IN MG OF ELEMENTAL CALCIUM) 1250 (500 Ca) MG tablet Take 1 tablet by  mouth daily with breakfast.    [provider]  cyclophosphamide (CYTOXAN) 50 MG capsule Take 10 capsules ('500mg'$ ) by mouth once weekly for 3 weeks on, 1 week off, repeat every 4 weeks. Take early in the day. Maintain hydration. 10/28/18   Ladell Pier, MD  dexamethasone (DECADRON) 4 MG tablet Take 5 tablets ('20mg'$ ) weekly day of chemotherapy 10/21/18   Ladell Pier, MD  diltiazem (CARDIZEM) 30 MG tablet Take 1 tablet (30 mg total) by mouth every 12 (twelve) hours. 10/19/18   [name], MD  feeding supplement, ENSURE ENLIVE, (ENSURE ENLIVE) LIQD Take 237 mLs by mouth 2 (two) times daily between meals. Patient taking differently: Take 237 mLs by mouth See admin instructions. Drink one bottle (237 ml) by mouth one or two times daily as a meal supplement 09/17/18   [name], MD  ferrous sulfate 325 (65 FE) MG tablet Take 1 tablet (325 mg total) by mouth daily  with breakfast. 03/02/17   [name], MD  nitroGLYCERIN (NITROSTAT) 0.4 MG SL tablet Place 0.4 mg under the tongue every 5 (five) minutes as needed for chest pain. 03/20/17   [provider]  oxyCODONE-acetaminophen (PERCOCET/ROXICET) 5-325 MG tablet Take 1 tablet by mouth every 4 (four) hours as needed for moderate pain or severe pain. 11/02/18   Eugenie Filler, MD  pantoprazole (PROTONIX) 40 MG tablet Take 1 tablet (40 mg total) by mouth 2 (two) times daily before a meal. 09/17/18   [name], MD  Potassium Chloride ER (K-TAB) 20 MEQ TBCR Take 20 mEq by mouth daily. 10/21/18   Ladell Pier, MD  pyridostigmine (MESTINON) 60 MG tablet Take 0.5 tablets (30 mg total) by mouth 2 (two) times daily. 01/25/17   [name], MD  tiZANidine (ZANAFLEX) 4 MG tablet Take 4 mg by mouth at bedtime as needed for muscle spasms.  04/06/18   [provider]  ISOSORBIDE DINITRATE PO Take by mouth.  12/30/11  [provider]    Family History Family History  Problem Relation Age of Onset  . Breast cancer Mother   . Breast cancer Sister   . Breast cancer Maternal Aunt   . Colon cancer Neg Hx     Social History Social History   Tobacco Use  . Smoking status: Current Every Day Smoker    Packs/day: 0.50    Years: 50.00    Pack years: 25.00    Types: Cigarettes  . Smokeless tobacco: Never Used  Substance Use Topics  . Alcohol use: No    Alcohol/week: 0.0 standard drinks  . Drug use: No     Allergies   Sulfa antibiotics   Review of Systems Review of Systems  Unable to perform ROS: Patient unresponsive  level 5 caveat - pt unresponsive.    Physical Exam Updated Vital Signs There were no vitals taken for this visit.  Physical Exam Vitals signs and nursing note reviewed.  Constitutional:      General: She is in acute distress.     Appearance: Normal appearance. She is well-developed.  HENT:     Head: Atraumatic.     Nose: Nose normal.     Mouth/Throat:      Mouth: Mucous membranes are moist.  Eyes:     General: No scleral icterus.    Conjunctiva/sclera: Conjunctivae normal.     Pupils: Pupils are equal, round, and reactive to light.  Neck:     Musculoskeletal: Normal range of motion and neck supple. No neck  rigidity.     Trachea: No tracheal deviation.     Comments: ETT. Cardiovascular:     Rate and Rhythm: Normal rate and regular rhythm.     Pulses: Normal pulses.     Heart sounds: Normal heart sounds. No murmur. No friction rub. No gallop.   Pulmonary:     Comments: Intubated. Bag ventilated. bil bs.  Abdominal:     General: Bowel sounds are normal. There is no distension.     Palpations: Abdomen is soft. There is no mass.     Tenderness: There is no abdominal tenderness. There is no guarding.  Genitourinary:    Comments: No cva tenderness.  Musculoskeletal:        General: No swelling.  Skin:    General: Skin is warm and dry.     Findings: No rash.  Neurological:     Mental Status: She is alert.     Comments: Intubated, unresponsive.   Psychiatric:     Comments: Unresponsive.       ED Treatments / Results  Labs (all labs ordered are listed, but only abnormal results are displayed) Results for orders placed or performed during the hospital encounter of 11/12/18  CBC  Result Value Ref Range   WBC 12.3 (H) 4.0 - 10.5 K/uL   RBC 3.52 (L) 3.87 - 5.11 MIL/uL   Hemoglobin 10.3 (L) 12.0 - 15.0 g/dL   HCT 36.1 36.0 - 46.0 %   MCV 102.6 (H) 80.0 - 100.0 fL   MCH 29.3 26.0 - 34.0 pg   MCHC 28.5 (L) 30.0 - 36.0 g/dL   RDW 19.5 (H) 11.5 - 15.5 %   Platelets 288 150 - 400 K/uL   nRBC 0.2 0.0 - 0.2 %  Comprehensive metabolic panel  Result Value Ref Range   Sodium 140 135 - 145 mmol/L   Potassium 2.1 (LL) 3.5 - 5.1 mmol/L   Chloride 102 98 - 111 mmol/L   CO2 13 (L) 22 - 32 mmol/L   Glucose, Bld 259 (H) 70 - 99 mg/dL   BUN 7 (L) 8 - 23 mg/dL   Creatinine, Ser 1.42 (H) 0.44 - 1.00 mg/dL   Calcium 8.7 (L) 8.9 - 10.3  mg/dL   Total Protein 7.1 6.5 - 8.1 g/dL   Albumin 3.6 3.5 - 5.0 g/dL   AST 140 (H) 15 - 41 U/L   ALT 68 (H) 0 - 44 U/L   Alkaline Phosphatase 68 38 - 126 U/L   Total Bilirubin 1.5 (H) 0.3 - 1.2 mg/dL   GFR calc non Af Amer 37 (L) >60 mL/min   GFR calc Af Amer 43 (L) >60 mL/min   Anion gap 25 (H) 5 - 15  I-stat troponin, ED  Result Value Ref Range   Troponin i, poc 0.22 (HH) 0.00 - 0.08 ng/mL   Comment NOTIFIED PHYSICIAN    Comment 3           Ct Abdomen Pelvis Wo Contrast  Result Date: 10/22/2018 CLINICAL DATA:  Weakness, abdominal pain EXAM: CT ABDOMEN AND PELVIS WITHOUT CONTRAST TECHNIQUE: Multidetector CT imaging of the abdomen and pelvis was performed following the standard protocol without IV contrast. COMPARISON:  09/24/2018 FINDINGS: Lower chest: Bibasilar atelectasis. Normal heart size. Atherosclerosis noted. No pericardial or pleural effusion. No large hiatal hernia. Hepatobiliary: No focal liver abnormality is seen. No gallstones, gallbladder wall thickening, or biliary dilatation. Pancreas: Unremarkable. No pancreatic ductal dilatation or surrounding inflammatory changes. Spleen: Normal in size.  Accessory splenules noted.  Adrenals/Urinary Tract: Normal adrenal glands. No acute renal obstruction, hydronephrosis, obstructing ureteral calculus, or UVJ abnormality. Bilateral renal artery stents noted. Similar hypodense and hyperdense left renal cysts are present, largest measures 2.2 cm laterally. Stomach/Bowel: Stomach and small bowel demonstrate no significant finding, ileus or obstruction pattern. Appendix not visualized with certainty. Wall thickening and surrounding edema noted of the colon extending from the splenic flexure and the proximal first half of the left descending colon compatible with acute left colitis. Mild distention of the transverse colon may represent associated mild ileus related to the colitis. More distal sigmoid and rectum demonstrate diverticulosis without wall  thickening. No evidence of perforation, abscess, hemorrhage or hematoma. Small amount of pelvic free fluid likely related to the colitis. Vascular/Lymphatic: Atherosclerosis of the aorta and abdominal vasculature. Negative for aneurysm. No adenopathy. Reproductive: Status post hysterectomy. No adnexal masses. Other: Intact abdominal wall. Negative for hernia. Trace pelvic ascites. Musculoskeletal: Degenerative changes noted of the spine. Stable retrolisthesis of T12 upon L1 and L1 upon L2 related to degenerative change. Stable myeloma changes at T12. No acute compression fracture or significant interval change. IMPRESSION: Acute left colitis extending from the splenic flexure and involving the proximal first half of the left descending colon which may be related to infectious/inflammatory etiology versus ischemic. No associated definite obstruction pattern, free air, or pneumatosis. Trace pelvic ascites, suspect related to the colitis. No fluid collection or abscess Electronically Signed   By: Jerilynn Mages.  Shick M.D.   On: 10/22/2018 11:32   Dg Chest Port 1 View  Result Date: 11/12/2018 CLINICAL DATA:  Endotracheal tube placement. EXAM: PORTABLE CHEST 1 VIEW COMPARISON:  10/23/2018. FINDINGS: Endotracheal tube tip noted 3.1 cm above the carina. NG tube noted with tip at the gastroesophageal junction. Advancement of approximately 10-15 cm should be considered. Cardiomegaly with normal pulmonary vascularity. No focal infiltrate. No pleural effusion or pneumothorax. Gastric distention noted. IMPRESSION: 1. NG tube noted with its tip at the gastroesophageal junction. Advancement of approximately 10-15 cm should be considered. Gastric distention is noted. 2.  Endotracheal tube noted with tip 3.1 cm above the carina. 3. Cardiomegaly. No pulmonary venous congestion. No focal pulmonary infiltrate. Critical Value/emergent results were called by telephone at the time of interpretation on 11/12/2018 at 12:25 pm to nurse Claiborne Billings, who  verbally acknowledged these results. Electronically Signed   By: Marcello Moores  Register   On: 11/12/2018 12:27   Dg Chest Port 1 View  Result Date: 10/23/2018 CLINICAL DATA:  Acute onset of respiratory failure. EXAM: PORTABLE CHEST 1 VIEW COMPARISON:  Chest radiograph performed 10/22/2018 FINDINGS: The lungs are well-aerated. Vascular congestion is noted. Minimal bibasilar atelectasis is seen. There is no evidence of pleural effusion or pneumothorax. The cardiomediastinal silhouette is within normal limits. No acute osseous abnormalities are seen. IMPRESSION: Vascular congestion noted. Minimal bibasilar atelectasis seen. Electronically Signed   By: Garald Balding M.D.   On: 10/23/2018 04:52   Dg Chest Port 1 View  Result Date: 10/22/2018 CLINICAL DATA:  Chest pain, abdominal pain, weakness, history multiple myeloma, coronary artery disease post MI, hypertension, arrhythmia is, smoker EXAM: PORTABLE CHEST 1 VIEW COMPARISON:  Portable exam 0803 hours compared to 10/08/2018 FINDINGS: Upper normal heart size. Atherosclerotic calcification aorta. Mediastinal contours and pulmonary vascularity normal. Minimal bibasilar atelectasis. Lungs otherwise clear. No infiltrate, pleural effusion or pneumothorax. Inferior spur formation RIGHT acromion with adjacent nonspecific soft tissue calcification. IMPRESSION: Bibasilar atelectasis. Electronically Signed   By: Lavonia Dana M.D.   On: 10/22/2018 08:35   Dg Abd  2 Views  Result Date: 10/16/2018 CLINICAL DATA:  Abdominal pain. EXAM: ABDOMEN - 2 VIEW COMPARISON:  CT scan of September 24, 2018. Radiograph of December 08, 2016. FINDINGS: The bowel gas pattern is normal. There is no evidence of free air. Phleboliths are noted in the pelvis. IMPRESSION: No evidence of bowel obstruction or ileus. Electronically Signed   By: Marijo Conception, M.D.   On: 10/16/2018 20:57   [place] Ekg Site Rite  Result Date: 10/27/2018 If Site Rite image not attached, placement could not be confirmed due to  current cardiac rhythm.   EKG EKG Interpretation  Date/Time:  Friday November 12 2018 11:44:33 EST Ventricular Rate:  81 PR Interval:    QRS Duration: 126 QT Interval:  304 QTC Calculation: 353 R Axis:   107 Text Interpretation:  Sinus rhythm Atrial premature complex Prolonged PR interval Nonspecific intraventricular conduction delay QT prolonged U waves present Reconfirmed by Lajean Saver [phone]) on 11/12/2018 1:57:26 PM   Radiology Dg Chest Port 1 View  Result Date: 11/12/2018 CLINICAL DATA:  Endotracheal tube placement. EXAM: PORTABLE CHEST 1 VIEW COMPARISON:  10/23/2018. FINDINGS: Endotracheal tube tip noted 3.1 cm above the carina. NG tube noted with tip at the gastroesophageal junction. Advancement of approximately 10-15 cm should be considered. Cardiomegaly with normal pulmonary vascularity. No focal infiltrate. No pleural effusion or pneumothorax. Gastric distention noted. IMPRESSION: 1. NG tube noted with its tip at the gastroesophageal junction. Advancement of approximately 10-15 cm should be considered. Gastric distention is noted. 2.  Endotracheal tube noted with tip 3.1 cm above the carina. 3. Cardiomegaly. No pulmonary venous congestion. No focal pulmonary infiltrate. Critical Value/emergent results were called by telephone at the time of interpretation on 11/12/2018 at 12:25 pm to nurse Claiborne Billings, who verbally acknowledged these results. Electronically Signed   By: Marcello Moores  Register   On: 11/12/2018 12:27    Procedures Procedures (including critical care time)  Medications Ordered in ED Medications  sodium chloride 0.9 % bolus 500 mL (has no administration in time range)     Initial Impression / Assessment and Plan / ED Course  I have reviewed the triage vital signs and the nursing notes.  Pertinent labs & imaging results that were available during my care of the patient were reviewed by me and considered in my medical decision making (see chart for details).  Iv ns.  Continuous pulse ox and monitor. resp therapy consulted - on vent, settings adjusted.   Ecg. Stat labs. Stat pcxr.  Cooling/hypothermia protocol initiated.   Cardiology consulted. Discussed with Dr Doylene Canard - he will see in ED.  Critical care team consulted - discussed w icu physician -- they will see/admit to icu.   Recheck pt, no change from intial exam.   Labs reviewed - trop elev. Chem pending.   cxr reviewed - ett tube in good position.  Additional labs reviewed - k is very low. kcl iv ordered. Mg added to labs.   CRITICAL CARE  RE: v fib cardiac arrest s/p defibrillation, acute resp distress s/p intubation/mech vent, severe hypokalemia, volume depletion, elevated trop, suspect anoxic brain injury Performed by: Mirna Mires Total critical care time: 125 minutes Critical care time was exclusive of separately billable procedures and treating other patients. Critical care was necessary to treat or prevent imminent or life-threatening deterioration. Critical care was time spent personally by me on the following activities: development of treatment plan with patient and/or surrogate as well as nursing, discussions with consultants, evaluation of patient's response  to treatment, examination of patient, obtaining history from patient or surrogate, ordering and performing treatments and interventions, ordering and review of laboratory studies, ordering and review of radiographic studies, pulse oximetry and re-evaluation of patient's condition.    Final Clinical Impressions(s) / ED Diagnoses   Final diagnoses:  None    ED Discharge Orders    None       Lajean Saver, MD 11/12/18 1400

## 2018-11-12 NOTE — ED Notes (Signed)
Ice packs removed until after CT head per Laurey Arrow B

## 2018-11-12 NOTE — Procedures (Signed)
Central Venous Catheter Insertion Procedure Note Abigail [name] [id number] February 01, 1946  Procedure: Insertion of Central Venous Catheter Indications: Assessment of intravascular volume, Drug and/or fluid administration and Frequent blood sampling  Procedure Details Consent: Risks of procedure as well as the alternatives and risks of each were explained to the (patient/caregiver).  Consent for procedure obtained. Time Out: Verified patient identification, verified procedure, site/side was marked, verified correct patient position, special equipment/implants available, medications/allergies/relevent history reviewed, required imaging and test results available.  Performed Real time [place] used to ID and cannulate vessel   Maximum sterile technique was used including antiseptics, cap, gloves, gown, hand hygiene, mask and sheet. Skin prep: Chlorhexidine; local anesthetic administered A antimicrobial bonded/coated triple lumen catheter was placed in the right IJ vein using the Seldinger technique.  Evaluation Blood flow good Complications: No apparent complications Patient did tolerate procedure well. Chest X-ray ordered to verify placement.  CXR: pending.  Clementeen Graham 11/12/2018, 2:52 PM  Erick Colace ACNP-BC [place] Pager # [phone] OR # [phone] if no answer

## 2018-11-12 NOTE — Progress Notes (Signed)
Attempted EEG. Was asked to wait about 10-15 mins. Will try back at 1500.

## 2018-11-12 NOTE — H&P (Addendum)
NAME:  ZAELYNN FUCHS, MRN:  [id number], DOB:  [birth year], LOS: 0 ADMISSION DATE:  11/12/2018, CONSULTATION DATE:  1/24 REFERRING MD:  Ashok Cordia, CHIEF COMPLAINT:  Cardiac arrest    Brief History   73 year old female with significant history of multiple myeloma, and most recently failure to thrive following recent hospital admission for infectious colitis and septic shock.  Admitted 1/24 status post VT/VF arrest.  Overall time to return of spontaneous circulation estimated at over 12 minutes  History of present illness   This is a 73 year old female patient with multiple comorbidities as listed directly below.  She was most recently Discharged on 1/14 following a hospital admission for infectious colitis and resultant septic shock with multiorgan dysfunction.  She was treated in usual fashion with IV antibiotics, aggressive IV fluids, vasoactive drips.  She recovered to the point where she could return to home under the care of her sister where she has been significantly debilitated since that last hospitalization.  She presents to the emergency room on 1/23 via EMS.  She apparently was on the phone and suddenly stopped talking, she began to look off, was unresponsive, but apparently spontaneously breathing.  EMS was summoned, on EMS arrival she was found to be in to have a shockable rhythm.  ACLS was initiated she was defibrillated x3 times, CPR was initiated, initial return to spontaneous circulation estimated at 2 minutes.  She was intubated by EMS.  She again suffered another cardiac arrest (prior to arrival) this time return to return of spontaneous circulation was estimated at another 10 minutes.  Upon time of critical care arrival Dr. Doylene Canard was at bedside.  She was mechanically ventilated, maintaining circulating blood pressure spontaneously, but posturing with right sided gaze preference.  Past Medical History  Atrial fibrillation, anemia of chronic disease with prior GI bleed, multiple myeloma  currently on chemotherapy (last treated approximately 3 weeks ago) has not received chemotherapy due to recent hospitalization for dehydration and renal failure CKD stage III Moderate mitral valve regurgitation and tricuspid valve regurgitation Myasthenia gravis  Significant Hospital Events   1/24 admitted s/p VT/VF arrest/ shocked x3 in field. Intubated in field. Estimated time to ROSC ~12 minutes total including a second cardiac arrest in the ER. Made DNR on admit   Consults:  Cardiology Doylene Canard)  Procedures:  OETT 1/24>>>   Significant Diagnostic Tests:  CT brain 1/24:Negative for acute CT findings of brain injury.  She is got age-related atrophy, old small vessel infarction of the left frontal lobe, and lytic lesions which are stable throughout the calvarium  Micro Data:  Respiratory culture 1/24>>>  Antimicrobials:     Interim history/subjective:  Unresponsive   Objective   Blood pressure (Abnormal) 170/118, pulse 88, temperature (Abnormal) 94.4 F (34.7 C), temperature source Temporal, resp. rate 19, height _0  (1.651 m), weight 74 kg, SpO2 100 %.    Vent Mode: PRVC FiO2 (%):  [100 %] 100 % Set Rate:  [16 bmp] 16 bmp Vt Set:  [480 mL] 480 mL PEEP:  [5 cmH20] 5 cmH20 Plateau Pressure:  [16 cmH20] 16 cmH20  No intake or output data in the 24 hours ending 11/12/18 1302 Filed Weights   11/12/18 1206  Weight: 74 kg    Examination: General: Chronically ill-appearing 73 year old female currently unresponsive on full ventilator support HENT: Normocephalic atraumatic pupils reactive, upward right away gaze Lungs: Diffuse rhonchi no accessory use Cardiovascular: Systolic murmur, currently fib with controlled ventricular response Abdomen: Soft, not tender Extremities: Cool, warm, pulses  palpable Neuro: Unresponsive, decerebrate posturing to noxious stimulus, rightward upward gaze GU: Clear yellow  Resolved Hospital Problem list   Recent infectious  colitis  Assessment & Plan:  VT/VF cardiac arrest Plan Telemetry monitoring Amiodarone per cardiology No anticoagulation given recent GI bleeding Cycle cardiac enzymes Correct electrolytes Echocardiogram Consider cardiac catheterization only if has neurological recovery  Acute respiratory failure secondary to cardiac arrest Portable chest x-ray personally reviewed: This demonstrates no infiltrate, endotracheal tubes in satisfactory position however the gastric tube needs to be advanced the film is rotated. Plan Full ventilator support Follow-up arterial blood gas VAP bundle Weaning approach to be determined depending on possible hypothermia protocol  Acute metabolic encephalopathy, with concern for an anoxic brain injury Time to return of spontaneous circulation estimated total of at least 12 minutes Clinical exam worrisome as has rightward gaze preference and decerebrate posturing Plan Stat CT of head EEG Hypothermia protocol if no bleeding, no clear anoxic injury, or evidence of metastasis MRI brain if no improvement after warming complete  History of atrial fibrillation, coronary artery disease, mitral valve regurgitation and hypertension Plan Continuing telemetry No anticoagulation Amiodarone as above  Acute on chronic renal failure status post cardiac arrest -Baseline CKD stage III Plan IV hydration Renal dose medications Strict intake and output Follow-up chemistry  Anion gap metabolic acidosis, suspect this is lactic acidosis following cardiopulmonary arrest -If lactic acid present this is unlikely sepsis and more likely status post cardiac arrest Plan Checking lactic acid, and cycle as indicated Check arterial blood gas  Fluid and electrolyte imbalance: Profound hypokalemia Plan Replace and recheck  Mild elevated LFT -shock liver Plan Follow-up a.m. LFTs  Hyperglycemia Plan Sliding scale insulin  Multiple myeloma with diffuse lytic bone lesions:  Last seen on 1/2 in oncology. -Most recent Zometa infusion 10/08/2017 -Chemotherapy has been held due to debilitated state Plan Follow-up with oncology depending on neurological recovery I doubt her performance status will allow her to pursue further therapy  H/o myasthenia gravis Plan Cont mestinon Note current medications for hypothermia may exacerbate MG symptoms but think benefit outweighs risk given concern for anoxic injury   Best practice:  Diet: N.p.o. Pain/Anxiety/Delirium protocol (if indicated): hypothermia protocol  VAP protocol (if indicated): 1/24 DVT prophylaxis:  heparin GI prophylaxis: 1/24 Glucose control: ssi 1/24 Mobility: BR Code Status: DNR  Family Communication: sister updated Disposition: 73 year old female who is critically ill status post VT/VF arrest.  She has a history of multiple myeloma however there is no evidence of metastasis to the brain.  She is had stable skull base lesions.  Her downtime was estimated at least 12 minutes, I am worried that she has had an anoxic injury, however at this point it is too early to tell.  For now the plan will be to initiate the hypothermia protocol, provide supportive care, obtain EEG to rule out seizure.  If she is having seizure will need to load anti-convulsants and contact neurology, following hypothermia will contact neurology to assist with further prognostics if not more awake.  I did discuss gust the patient's poor health with her sister.  She is weak at baseline, has just recovered from a hospital illness.  She is prepared that she could do poorly, and may have sustained a devastating neurological injury.  She will be a DO NOT RESUSCITATE should she have another cardiac arrest  Labs   CBC: Recent Labs  Lab 11/12/18 1200  WBC 12.3*  HGB 10.3*  HCT 36.1  MCV 102.6*  PLT 288  Basic Metabolic Panel: Recent Labs  Lab 11/12/18 1200  NA 140  K 2.1*  CL 102  CO2 13*  GLUCOSE 259*  BUN 7*  CREATININE  1.42*  CALCIUM 8.7*   GFR: Estimated Creatinine Clearance: 36.6 mL/min (A) (by C-G formula based on SCr of 1.42 mg/dL (H)). Recent Labs  Lab 11/12/18 1200  WBC 12.3*    Liver Function Tests: Recent Labs  Lab 11/12/18 1200  AST 140*  ALT 68*  ALKPHOS 68  BILITOT 1.5*  PROT 7.1  ALBUMIN 3.6   No results for input(s): LIPASE, AMYLASE in the last 168 hours. No results for input(s): AMMONIA in the last 168 hours.  ABG    Component Value Date/Time   TCO2 20 (L) 10/22/2018 4627     Coagulation Profile: No results for input(s): INR, PROTIME in the last 168 hours.  Cardiac Enzymes: No results for input(s): CKTOTAL, CKMB, CKMBINDEX, TROPONINI in the last 168 hours.  HbA1C: Hgb A1c MFr Bld  Date/Time Value Ref Range Status  12/31/2011 06:41 AM 5.7 (H) <5.7 % Final    Comment:    (NOTE)                                                                       According to the ADA Clinical Practice Recommendations for 2011, when HbA1c is used as a screening test:  >=6.5%   Diagnostic of Diabetes Mellitus           (if abnormal result is confirmed) 5.7-6.4%   Increased risk of developing Diabetes Mellitus References:Diagnosis and Classification of Diabetes Mellitus,Diabetes OJJK,0938,18(EXHBZ 1):S62-S69 and Standards of Medical Care in         Diabetes - 2011,Diabetes JIRC,7893,81 (Suppl 1):S11-S61.    CBG: No results for input(s): GLUCAP in the last 168 hours.  Review of Systems:   Not able  Past Medical History  She,  has a past medical history of ACS (acute coronary syndrome) ([place]) (06/2017), Anemia, Anxiety, Bilateral renal artery stenosis ([place]) (1999), Chronic kidney disease (CKD), stage III (moderate) ([place]), Coronary artery disease (1999), Depression, Diverticulosis, Duodenitis, DVT (deep venous thrombosis) ([place]), Dyspnea, Gastric erosions, Gastric ulcer, GERD (gastroesophageal reflux disease), Heart murmur, History of blood transfusion (~ 03/2016), Hyperlipidemia,  Hypertension, MI (myocardial infarction) ([place]) (1999), Multiple myeloma ([place]) (dx'd ~ 04/2016), PAT (paroxysmal atrial tachycardia) ([place]) (12/22/2016), Retinal hemorrhage, right eye, Schatzki's ring, Tachy-brady syndrome ([place]), and Tubular adenoma of colon.   Surgical History    Past Surgical History:  Procedure Laterality Date  . APPENDECTOMY    . BACK SURGERY    . CATARACT EXTRACTION W/ INTRAOCULAR LENS IMPLANT Right 2014  . COLONOSCOPY WITH PROPOFOL N/A 09/04/2017   Procedure: COLONOSCOPY WITH PROPOFOL;  Surgeon: Doran Stabler, MD;  Location: [place];  Service: Gastroenterology;  Laterality: N/A;  . CORONARY ANGIOGRAPHY N/A 01/23/2017   Procedure: Coronary Angiography;  Surgeon: [name], MD;  Location: [place] [facility];  Service: Cardiovascular;  Laterality: N/A;  . CORONARY ANGIOPLASTY WITH STENT PLACEMENT  1999  . CORONARY STENT INTERVENTION N/A 12/29/2016   Procedure: Coronary Stent Intervention;  Surgeon: Charolette Forward, MD;  Location: [place] [facility];  Service: Cardiovascular;  Laterality: N/A;  . CORONARY STENT INTERVENTION N/A 02/24/2017  Procedure: Coronary Stent Intervention;  Surgeon: Charolette Forward, MD;  Location: [place] [facility];  Service: Cardiovascular;  Laterality: N/A;  . ESOPHAGOGASTRODUODENOSCOPY N/A 11/03/2015   Procedure: ESOPHAGOGASTRODUODENOSCOPY (EGD);  Surgeon: Carol Ada, MD;  Location: [facility];  Service: Endoscopy;  Laterality: N/A;  . ESOPHAGOGASTRODUODENOSCOPY N/A 09/03/2017   Procedure: ESOPHAGOGASTRODUODENOSCOPY (EGD);  Surgeon: Doran Stabler, MD;  Location: [place];  Service: Gastroenterology;  Laterality: N/A;  . ESOPHAGOGASTRODUODENOSCOPY (EGD) WITH PROPOFOL N/A 11/18/2016   Procedure: ESOPHAGOGASTRODUODENOSCOPY (EGD) WITH PROPOFOL;  Surgeon: Ladene Artist, MD;  Location: [facility];  Service: Endoscopy;  Laterality: N/A;  . LEFT HEART CATH AND CORONARY ANGIOGRAPHY N/A 12/24/2016   Procedure: Left Heart Cath and Coronary  Angiography;  Surgeon: [name], MD;  Location: [place] [facility];  Service: Cardiovascular;  Laterality: N/A;  . LEFT HEART CATH AND CORONARY ANGIOGRAPHY N/A 02/24/2017   Procedure: Left Heart Cath and Coronary Angiography;  Surgeon: Charolette Forward, MD;  Location: [place] [facility];  Service: Cardiovascular;  Laterality: N/A;  . LEFT HEART CATH AND CORONARY ANGIOGRAPHY N/A 03/05/2017   Procedure: Left Heart Cath and Coronary Angiography;  Surgeon: [name], MD;  Location: [place] [facility];  Service: Cardiovascular;  Laterality: N/A;  . LEFT HEART CATH AND CORONARY ANGIOGRAPHY N/A 09/02/2018   Procedure: LEFT HEART CATH AND CORONARY ANGIOGRAPHY;  Surgeon: [name], MD;  Location: [place] [facility];  Service: Cardiovascular;  Laterality: N/A;  . LUMBAR [place]    . RENAL ARTERY STENT  1999   bil RAS so ? bil vs unilateral stents  . RETINAL LASER PROCEDURE Right 2012   "bleeding"  . TONSILLECTOMY    . TOTAL ABDOMINAL HYSTERECTOMY       Social History   reports that she has been smoking cigarettes. She has a 25.00 pack-year smoking history. She has never used smokeless tobacco. She reports that she does not drink alcohol or use drugs.   Family History   Her family history includes Breast cancer in her maternal aunt, mother, and sister. There is no history of Colon cancer.   Allergies Allergies  Allergen Reactions  . Sulfa Antibiotics Rash     Home Medications  Prior to Admission medications   Medication Sig Start Date End Date Taking? Authorizing Provider  acyclovir (ZOVIRAX) 400 MG tablet TAKE 1 TABLET (400 MG TOTAL) BY MOUTH 2 (TWO) TIMES DAILY. 08/29/16   Ladell Pier, MD  amiodarone (PACERONE) 200 MG tablet Take 0.5 tablets (100 mg total) by mouth daily. 10/19/18   [name], MD  atorvastatin (LIPITOR) 80 MG tablet Take 0.5 tablets (40 mg total) by mouth daily at 6 PM. 10/03/18   Charolette Forward, MD  calcium carbonate (OS-CAL - DOSED IN MG OF ELEMENTAL  CALCIUM) 1250 (500 Ca) MG tablet Take 1 tablet by mouth daily with breakfast.    [provider]  cyclophosphamide (CYTOXAN) 50 MG capsule Take 10 capsules (579m) by mouth once weekly for 3 weeks on, 1 week off, repeat every 4 weeks. Take early in the day. Maintain hydration. 10/28/18   SLadell Pier MD  dexamethasone (DECADRON) 4 MG tablet Take 5 tablets (214m weekly day of chemotherapy 10/21/18   ShLadell PierMD  diltiazem (CARDIZEM) 30 MG tablet Take 1 tablet (30 mg total) by mouth every 12 (twelve) hours. 10/19/18   KaDixie DialsMD  feeding supplement, ENSURE ENLIVE, (ENSURE ENLIVE) LIQD Take 237 mLs by mouth 2 (two) times daily between meals. Patient taking  differently: Take 237 mLs by mouth See admin instructions. Drink one bottle (237 ml) by mouth one or two times daily as a meal supplement 09/17/18   [name], MD  ferrous sulfate 325 (65 FE) MG tablet Take 1 tablet (325 mg total) by mouth daily with breakfast. 03/02/17   [name], MD  nitroGLYCERIN (NITROSTAT) 0.4 MG SL tablet Place 0.4 mg under the tongue every 5 (five) minutes as needed for chest pain. 03/20/17   [provider]  oxyCODONE-acetaminophen (PERCOCET/ROXICET) 5-325 MG tablet Take 1 tablet by mouth every 4 (four) hours as needed for moderate pain or severe pain. 11/02/18   Eugenie Filler, MD  pantoprazole (PROTONIX) 40 MG tablet Take 1 tablet (40 mg total) by mouth 2 (two) times daily before a meal. 09/17/18   [name], MD  Potassium Chloride ER (K-TAB) 20 MEQ TBCR Take 20 mEq by mouth daily. 10/21/18   Ladell Pier, MD  pyridostigmine (MESTINON) 60 MG tablet Take 0.5 tablets (30 mg total) by mouth 2 (two) times daily. 01/25/17   [name], MD  tiZANidine (ZANAFLEX) 4 MG tablet Take 4 mg by mouth at bedtime as needed for muscle spasms.  04/06/18   [provider]  ISOSORBIDE DINITRATE PO Take by mouth.  12/30/11  [provider]     Critical care time: 60 minutes     Erick Colace ACNP-BC [place] Pager # [phone] OR # [phone] if no answer  Attending Note:  73 year old female with acute myeloma currently undergoing chemo who had a witnessed arrest.  Down time is anywhere from 2-14 minutes and was noted to be in V-fib arrest and was shocked 3 times.  Amio was started.  On exam, the patient is posturing and not following commands.  I reviewed CXR myself, ETT is in a good position.  Head CT has no masses.  Will proceed with hypothermia protocol.  Place TLC.  Pressors as needed.  Spoke with patient's sister at length, DNR status confirmed.  Will go through the hypothermia protocol for now and monitor progress.  Get an EEG to evaluate for seizure since on exam the patient's eyes are deviated to the right.  PCCM will continue to follow.  The patient is critically ill with multiple organ systems failure and requires high complexity decision making for assessment and support, frequent evaluation and titration of therapies, application of advanced monitoring technologies and extensive interpretation of multiple databases.   Critical Care Time devoted to patient care services described in this note is  45  Minutes. This time reflects time of care of this signee Dr Jennet Maduro. This critical care time does not reflect procedure time, or teaching time or supervisory time of PA/NP/Med student/Med Resident etc but could involve care discussion time.  Rush Farmer, M.D. [facility] Pulmonary/Critical Care Medicine. Pager: [phone]. After hours pager: [phone].

## 2018-11-12 NOTE — Progress Notes (Signed)
Attempted ABG X1 unsuccessful.  RN aware.

## 2018-11-12 NOTE — ED Triage Notes (Addendum)
Patient presents to ED via [facility] states she was talking on the phone to someone and just stopped talking. She lives with her sister. Patient was recently discharged from [facility] last tues for stomach infection. Patient was on her couch when FD arrived CpR started, upon ems arrival aed applied advised shock , patient was shocked x 3  epip x 3 450mg  amiodarone, intubated with 7.0 24 at teeth, cbg 175.

## 2018-11-12 NOTE — Progress Notes (Signed)
EEG completed, results pending. 

## 2018-11-12 NOTE — Consult Note (Signed)
Referring Physician:  [name] is an 73 y.o. female.                       Chief Complaint: V. Fib Cardiac arrest  HPI: 73 year old female had cardiac arrest at home. [facility]. [facility] personnel started CPR for 2 minutes and EMS applied AED and shocked x 3, epinephrine x 3 and 450 mg. Amiodarone + intubation. Patient is posturing with fist clinched and pupils are reacting sluggishly. She has PMH of LAD and LCX stents, chest pain, left leg DVT, Obesity, Chronic anemia and multiple myeloma treated with chemotherapy.   Past Medical History:  Diagnosis Date  . ACS (acute coronary syndrome) ([place]) 06/2017  . Anemia    "off and on all my life" (12/22/2016)  . Anxiety   . Bilateral renal artery stenosis ([place]) 1999   s/p stenting  . Chronic kidney disease (CKD), stage III (moderate) ([place])    Abigail [name] 12/22/2016  . Coronary artery disease 1999  . Depression   . Diverticulosis   . Duodenitis   . DVT (deep venous thrombosis) ([facility])    on Coumadin  . Dyspnea   . Gastric erosions   . Gastric ulcer   . GERD (gastroesophageal reflux disease)   . Heart murmur   . History of blood transfusion ~ 03/2016   "low HgB; practically nonexistent"  . Hyperlipidemia   . Hypertension   . MI (myocardial infarction) ([place]) 1999  . Multiple myeloma ([place]) dx'd ~ 04/2016  . PAT (paroxysmal atrial tachycardia) ([place]) 12/22/2016  . Retinal hemorrhage, right eye   . Schatzki's ring   . Tachy-brady syndrome ([place])    Abigail [name] 12/22/2016  . Tubular adenoma of colon       Past Surgical History:  Procedure Laterality Date  . APPENDECTOMY    . BACK SURGERY    . CATARACT EXTRACTION W/ INTRAOCULAR LENS IMPLANT Right 2014  . COLONOSCOPY WITH PROPOFOL N/A 09/04/2017   Procedure: COLONOSCOPY WITH PROPOFOL;  Surgeon: Abigail Stabler, MD;  Location: [facility];  Service: Gastroenterology;  Laterality: N/A;  . CORONARY ANGIOGRAPHY N/A 01/23/2017   Procedure: Coronary  Angiography;  Surgeon: Abigail Dials, MD;  Location: [facility];  Service: Cardiovascular;  Laterality: N/A;  . CORONARY ANGIOPLASTY WITH STENT PLACEMENT  1999  . CORONARY STENT INTERVENTION N/A 12/29/2016   Procedure: Coronary Stent Intervention;  Surgeon: Abigail Forward, MD;  Location: [facility];  Service: Cardiovascular;  Laterality: N/A;  . CORONARY STENT INTERVENTION N/A 02/24/2017   Procedure: Coronary Stent Intervention;  Surgeon: Abigail Forward, MD;  Location: [facility];  Service: Cardiovascular;  Laterality: N/A;  . ESOPHAGOGASTRODUODENOSCOPY N/A 11/03/2015   Procedure: ESOPHAGOGASTRODUODENOSCOPY (EGD);  Surgeon: Abigail Ada, MD;  Location: [facility];  Service: Endoscopy;  Laterality: N/A;  . ESOPHAGOGASTRODUODENOSCOPY N/A 09/03/2017   Procedure: ESOPHAGOGASTRODUODENOSCOPY (EGD);  Surgeon: Abigail Stabler, MD;  Location: [facility];  Service: Gastroenterology;  Laterality: N/A;  . ESOPHAGOGASTRODUODENOSCOPY (EGD) WITH PROPOFOL N/A 11/18/2016   Procedure: ESOPHAGOGASTRODUODENOSCOPY (EGD) WITH PROPOFOL;  Surgeon: Abigail Artist, MD;  [facility];  Service: Endoscopy;  Laterality: N/A;  . LEFT HEART CATH AND CORONARY ANGIOGRAPHY N/A 12/24/2016   Procedure: Left Heart Cath and Coronary Angiography;  Surgeon: Abigail Dials, MD;  Location: [facility];  Service: Cardiovascular;  Laterality: N/A;  . LEFT HEART CATH AND CORONARY ANGIOGRAPHY N/A 02/24/2017   Procedure:  Left Heart Cath and Coronary Angiography;  Surgeon: Abigail Forward, MD;  Location: [facility];  Service: Cardiovascular;  Laterality: N/A;  . LEFT HEART CATH AND CORONARY ANGIOGRAPHY N/A 03/05/2017   Procedure: Left Heart Cath and Coronary Angiography;  Surgeon: Abigail Dials, MD;  Location: [facility];  Service: Cardiovascular;  Laterality: N/A;  . LEFT HEART CATH AND CORONARY ANGIOGRAPHY N/A 09/02/2018   Procedure: LEFT HEART CATH AND CORONARY ANGIOGRAPHY;  Surgeon: Abigail Dials, MD;  Location: [facility];  Service: Cardiovascular;  Laterality: N/A;  . LUMBAR [place]    . RENAL ARTERY STENT  1999   bil RAS so ? bil vs unilateral stents  . RETINAL LASER PROCEDURE Right 2012   "bleeding"  . TONSILLECTOMY    . TOTAL ABDOMINAL HYSTERECTOMY      Family History  Problem Relation Age of Onset  . Breast cancer Mother   . Breast cancer Sister   . Breast cancer Maternal Aunt   . Colon cancer Neg Hx    Social History:  reports that she has been smoking cigarettes. She has a 25.00 pack-year smoking history. She has never used smokeless tobacco. She reports that she does not drink alcohol or use drugs.  Allergies:  Allergies  Allergen Reactions  . Sulfa Antibiotics Rash    (Not in a hospital admission)   Results for orders placed or performed during the hospital encounter of 11/12/18 (from the past 48 hour(s))  CBC     Status: Abnormal   Collection Time: 11/12/18 12:00 PM  Result Value Ref Range   WBC 12.3 (H) 4.0 - 10.5 K/uL   RBC 3.52 (L) 3.87 - 5.11 MIL/uL   Hemoglobin 10.3 (L) 12.0 - 15.0 g/dL   HCT 36.1 36.0 - 46.0 %   MCV 102.6 (H) 80.0 - 100.0 fL   MCH 29.3 26.0 - 34.0 pg   MCHC 28.5 (L) 30.0 - 36.0 g/dL   RDW 19.5 (H) 11.5 - 15.5 %   Platelets 288 150 - 400 K/uL   nRBC 0.2 0.0 - 0.2 %    Comment: Performed at [facility], [address] [place],  [address]  I-stat troponin, ED     Status: Abnormal   Collection Time: 11/12/18 12:06 PM  Result Value Ref Range   Troponin i, poc 0.22 (HH) 0.00 - 0.08 ng/mL   Comment NOTIFIED PHYSICIAN    Comment 3            Comment: Due to the release kinetics of cTnI, a negative result within the first hours of the onset of symptoms does not rule out myocardial infarction with certainty. If myocardial infarction is still suspected, repeat the test at appropriate intervals.    Dg Chest Port 1 View  Result Date: 11/12/2018 CLINICAL DATA:  Endotracheal tube placement. EXAM:  PORTABLE CHEST 1 VIEW COMPARISON:  10/23/2018. FINDINGS: Endotracheal tube tip noted 3.1 cm above the carina. NG tube noted with tip at the gastroesophageal junction. Advancement of approximately 10-15 cm should be considered. Cardiomegaly with normal pulmonary vascularity. No focal infiltrate. No pleural effusion or pneumothorax. Gastric distention noted. IMPRESSION: 1. NG tube noted with its tip at the gastroesophageal junction. Advancement of approximately 10-15 cm should be considered. Gastric distention is noted. 2.  Endotracheal tube noted with tip 3.1 cm above the carina. 3. Cardiomegaly. No pulmonary venous congestion. No focal pulmonary infiltrate. Critical Value/emergent results were called by telephone at the time of interpretation on  11/12/2018 at 12:25 pm to nurse Claiborne Billings, who verbally acknowledged these results. Electronically Signed   By: Marcello Moores  Register   On: 11/12/2018 12:27    Review Of Systems As per history.   Blood pressure (!) 170/118, pulse 88, temperature (!) 94.4 F (34.7 C), temperature source Temporal, resp. rate 19, height 5' 5" (1.651 m), weight 74 kg, SpO2 100 %. Body mass index is 27.15 kg/m. General appearance: Intubated and not responding with posturing. She appears stated age. Head: Normocephalic, atraumatic. Eyes: Brown eyes, pink conjunctiva, corneas clear.  Neck: No adenopathy, no carotid bruit, no JVD, supple, symmetrical, trachea midline and thyroid not enlarged. Resp: Clear to auscultation bilaterally. Cardio: Irregular rate and rhythm, S1, S2 normal, II/VI systolic murmur, no click, rub or gallop GI: Soft. Extremities: No edema, cyanosis or clubbing. Skin: Cool and clammy.  Neurologic: Alert and oriented X 0. Holding arms little at times mostly from rigidity.  Assessment/Plan Survival of V. Fib cardiac arrest Hypoxic encephalopathy Multiple myeloma CAD LAD and LCx stents Chronic anemia H/O Recurrent abdominal pain  Continue IV amiodarone. Will  follow with CCM. IV hydralazine as needed for blood pressure control around 160/100.  Birdie Riddle, MD  11/12/2018, 12:56 PM

## 2018-11-12 NOTE — Progress Notes (Signed)
RT was asked to do A-Line but was called away for a more Urgent procedure.  Upon return to do A-Line we had to wait for paralytic to take effect.  RT attempted to insert A-Line 2X and was unsuccessful MD attempted 2X and was unsuccessful.  CCM will attempt A-Line later.

## 2018-11-12 NOTE — ED Notes (Signed)
Dr. Doylene Canard  At bedside.

## 2018-11-12 NOTE — Telephone Encounter (Signed)
Called patient per 1/22 sch message- unable to reach patient - left message on sisters phone - unabel to leave message on pt phone and sent reminder letter in the mail

## 2018-11-13 ENCOUNTER — Inpatient Hospital Stay (HOSPITAL_COMMUNITY): Payer: Medicare HMO

## 2018-11-13 DIAGNOSIS — C9000 Multiple myeloma not having achieved remission: Secondary | ICD-10-CM

## 2018-11-13 LAB — URINALYSIS, ROUTINE W REFLEX MICROSCOPIC
Bacteria, UA: NONE SEEN
Bilirubin Urine: NEGATIVE
Glucose, UA: NEGATIVE mg/dL
Ketones, ur: NEGATIVE mg/dL
Nitrite: NEGATIVE
Protein, ur: NEGATIVE mg/dL
SPECIFIC GRAVITY, URINE: 1.008 (ref 1.005–1.030)
pH: 5 (ref 5.0–8.0)

## 2018-11-13 LAB — BASIC METABOLIC PANEL
ANION GAP: 6 (ref 5–15)
Anion gap: 12 (ref 5–15)
Anion gap: 13 (ref 5–15)
Anion gap: 11 (ref 5–15)
Anion gap: 8 (ref 5–15)
Anion gap: 8 (ref 5–15)
BUN: 8 mg/dL (ref 8–23)
BUN: 8 mg/dL (ref 8–23)
BUN: 7 mg/dL — ABNORMAL LOW (ref 8–23)
BUN: 7 mg/dL — ABNORMAL LOW (ref 8–23)
BUN: 8 mg/dL (ref 8–23)
BUN: 8 mg/dL (ref 8–23)
CO2: 18 mmol/L — ABNORMAL LOW (ref 22–32)
CO2: 18 mmol/L — ABNORMAL LOW (ref 22–32)
CO2: 19 mmol/L — ABNORMAL LOW (ref 22–32)
CO2: 18 mmol/L — ABNORMAL LOW (ref 22–32)
CO2: 18 mmol/L — ABNORMAL LOW (ref 22–32)
CO2: 18 mmol/L — ABNORMAL LOW (ref 22–32)
CREATININE: 1.11 mg/dL — AB (ref 0.44–1.00)
Calcium: 7.6 mg/dL — ABNORMAL LOW (ref 8.9–10.3)
Calcium: 7.6 mg/dL — ABNORMAL LOW (ref 8.9–10.3)
Calcium: 6.9 mg/dL — ABNORMAL LOW (ref 8.9–10.3)
Calcium: 7 mg/dL — ABNORMAL LOW (ref 8.9–10.3)
Calcium: 6.9 mg/dL — ABNORMAL LOW (ref 8.9–10.3)
Calcium: 6.9 mg/dL — ABNORMAL LOW (ref 8.9–10.3)
Chloride: 112 mmol/L — ABNORMAL HIGH (ref 98–111)
Chloride: 111 mmol/L (ref 98–111)
Chloride: 117 mmol/L — ABNORMAL HIGH (ref 98–111)
Chloride: 115 mmol/L — ABNORMAL HIGH (ref 98–111)
Chloride: 116 mmol/L — ABNORMAL HIGH (ref 98–111)
Chloride: 117 mmol/L — ABNORMAL HIGH (ref 98–111)
Creatinine, Ser: 1.13 mg/dL — ABNORMAL HIGH (ref 0.44–1.00)
Creatinine, Ser: 1.09 mg/dL — ABNORMAL HIGH (ref 0.44–1.00)
Creatinine, Ser: 1.07 mg/dL — ABNORMAL HIGH (ref 0.44–1.00)
Creatinine, Ser: 1.18 mg/dL — ABNORMAL HIGH (ref 0.44–1.00)
Creatinine, Ser: 1.15 mg/dL — ABNORMAL HIGH (ref 0.44–1.00)
GFR calc Af Amer: 59 mL/min — ABNORMAL LOW (ref >60)
GFR calc Af Amer: >60 mL/min (ref >60)
GFR calc Af Amer: 58 mL/min — ABNORMAL LOW (ref >60)
GFR calc Af Amer: 55 mL/min — ABNORMAL LOW (ref >60)
GFR calc non Af Amer: 49 mL/min — ABNORMAL LOW (ref >60)
GFR calc non Af Amer: 51 mL/min — ABNORMAL LOW (ref >60)
GFR calc non Af Amer: 52 mL/min — ABNORMAL LOW (ref >60)
GFR calc non Af Amer: 50 mL/min — ABNORMAL LOW (ref >60)
GFR calc non Af Amer: 46 mL/min — ABNORMAL LOW (ref >60)
GFR calc non Af Amer: 48 mL/min — ABNORMAL LOW (ref >60)
GFR, EST AFRICAN AMERICAN: 57 mL/min — AB (ref >60)
GFR, EST AFRICAN AMERICAN: 54 mL/min — AB (ref >60)
Glucose, Bld: 93 mg/dL (ref 70–99)
Glucose, Bld: 120 mg/dL — ABNORMAL HIGH (ref 70–99)
Glucose, Bld: 95 mg/dL (ref 70–99)
Glucose, Bld: 109 mg/dL — ABNORMAL HIGH (ref 70–99)
Glucose, Bld: 108 mg/dL — ABNORMAL HIGH (ref 70–99)
Glucose, Bld: 120 mg/dL — ABNORMAL HIGH (ref 70–99)
Potassium: 3.9 mmol/L (ref 3.5–5.1)
Potassium: 2.6 mmol/L — CL (ref 3.5–5.1)
Potassium: 4 mmol/L (ref 3.5–5.1)
Potassium: 3.8 mmol/L (ref 3.5–5.1)
Potassium: 3.7 mmol/L (ref 3.5–5.1)
Potassium: 3.7 mmol/L (ref 3.5–5.1)
Sodium: 142 mmol/L (ref 135–145)
Sodium: 142 mmol/L (ref 135–145)
Sodium: 142 mmol/L (ref 135–145)
Sodium: 144 mmol/L (ref 135–145)
Sodium: 142 mmol/L (ref 135–145)
Sodium: 143 mmol/L (ref 135–145)

## 2018-11-13 LAB — POCT I-STAT 7, (LYTES, BLD GAS, ICA,H+H)
ACID-BASE DEFICIT: 4 mmol/L — AB (ref 0.0–2.0)
Bicarbonate: 18.6 mmol/L — ABNORMAL LOW (ref 20.0–28.0)
Calcium, Ion: 1.02 mmol/L — ABNORMAL LOW (ref 1.15–1.40)
HCT: 28 % — ABNORMAL LOW (ref 36.0–46.0)
Hemoglobin: 9.5 g/dL — ABNORMAL LOW (ref 12.0–15.0)
O2 Saturation: 100 %
PO2 ART: 172 mmHg — AB (ref 83.0–108.0)
Patient temperature: 33
Potassium: 2.8 mmol/L — ABNORMAL LOW (ref 3.5–5.1)
Sodium: 142 mmol/L (ref 135–145)
TCO2: 19 mmol/L — ABNORMAL LOW (ref 22–32)
pCO2 arterial: 20.9 mmHg — ABNORMAL LOW (ref 32.0–48.0)
pH, Arterial: 7.543 — ABNORMAL HIGH (ref 7.350–7.450)

## 2018-11-13 LAB — POCT I-STAT EG7
Acid-base deficit: 7 mmol/L — ABNORMAL HIGH (ref 0.0–2.0)
Bicarbonate: 17.3 mmol/L — ABNORMAL LOW (ref 20.0–28.0)
Calcium, Ion: 1.01 mmol/L — ABNORMAL LOW (ref 1.15–1.40)
HCT: 32 % — ABNORMAL LOW (ref 36.0–46.0)
Hemoglobin: 10.9 g/dL — ABNORMAL LOW (ref 12.0–15.0)
O2 SAT: 72 %
PH VEN: 7.457 — AB (ref 7.250–7.430)
Patient temperature: 31.9
Potassium: 2.2 mmol/L — CL (ref 3.5–5.1)
Sodium: 141 mmol/L (ref 135–145)
TCO2: 18 mmol/L — ABNORMAL LOW (ref 22–32)
pCO2, Ven: 23.2 mmHg — ABNORMAL LOW (ref 44.0–60.0)
pO2, Ven: 26 mmHg — CL (ref 32.0–45.0)

## 2018-11-13 LAB — CBC
HCT: 29.3 % — ABNORMAL LOW (ref 36.0–46.0)
Hemoglobin: 9.1 g/dL — ABNORMAL LOW (ref 12.0–15.0)
MCH: 29.5 pg (ref 26.0–34.0)
MCHC: 31.1 g/dL (ref 30.0–36.0)
MCV: 95.1 fL (ref 80.0–100.0)
Platelets: 262 10*3/uL (ref 150–400)
RBC: 3.08 MIL/uL — ABNORMAL LOW (ref 3.87–5.11)
RDW: 19.3 % — ABNORMAL HIGH (ref 11.5–15.5)
WBC: 15.5 10*3/uL — ABNORMAL HIGH (ref 4.0–10.5)
nRBC: 0.1 % (ref 0.0–0.2)

## 2018-11-13 LAB — GLUCOSE, CAPILLARY
GLUCOSE-CAPILLARY: 104 mg/dL — AB (ref 70–99)
GLUCOSE-CAPILLARY: 106 mg/dL — AB (ref 70–99)
GLUCOSE-CAPILLARY: 111 mg/dL — AB (ref 70–99)
Glucose-Capillary: 115 mg/dL — ABNORMAL HIGH (ref 70–99)
Glucose-Capillary: 122 mg/dL — ABNORMAL HIGH (ref 70–99)
Glucose-Capillary: 275 mg/dL — ABNORMAL HIGH (ref 70–99)
Glucose-Capillary: 88 mg/dL (ref 70–99)
Glucose-Capillary: 90 mg/dL (ref 70–99)
Glucose-Capillary: 92 mg/dL (ref 70–99)
Glucose-Capillary: 96 mg/dL (ref 70–99)
Glucose-Capillary: 97 mg/dL (ref 70–99)
Glucose-Capillary: 99 mg/dL (ref 70–99)

## 2018-11-13 LAB — TROPONIN I
Troponin I: 0.92 ng/mL (ref <0.03)
Troponin I: 0.94 ng/mL (ref <0.03)

## 2018-11-13 LAB — PROTIME-INR
INR: 1.17
Prothrombin Time: 14.8 seconds (ref 11.4–15.2)

## 2018-11-13 LAB — APTT: aPTT: 40 seconds — ABNORMAL HIGH (ref 24–36)

## 2018-11-13 LAB — ECHOCARDIOGRAM COMPLETE
Height: 65 in
Weight: 2610.25 oz

## 2018-11-13 LAB — MRSA PCR SCREENING: MRSA by PCR: NEGATIVE

## 2018-11-13 LAB — MAGNESIUM: Magnesium: 1 mg/dL — ABNORMAL LOW (ref 1.7–2.4)

## 2018-11-13 MED ORDER — SODIUM PHOSPHATES 45 MMOLE/15ML IV SOLN
30.0000 mmol | Freq: Once | INTRAVENOUS | Status: AC
  Administered 2018-11-13: 30 mmol via INTRAVENOUS
  Filled 2018-11-13: qty 10

## 2018-11-13 MED ORDER — MAGNESIUM SULFATE 4 GM/100ML IV SOLN
4.0000 g | Freq: Once | INTRAVENOUS | Status: AC
  Administered 2018-11-13: 4 g via INTRAVENOUS
  Filled 2018-11-13: qty 100

## 2018-11-13 MED ORDER — SODIUM CHLORIDE 0.9 % IV SOLN
INTRAVENOUS | Status: DC
  Administered 2018-11-13 (×4): via INTRAVENOUS
  Administered 2018-11-14: 60 mL/h via INTRAVENOUS
  Administered 2018-11-14 – 2018-11-17 (×4): via INTRAVENOUS

## 2018-11-13 MED ORDER — POTASSIUM CHLORIDE 10 MEQ/50ML IV SOLN
10.0000 meq | INTRAVENOUS | Status: AC
  Administered 2018-11-13 (×6): 10 meq via INTRAVENOUS
  Filled 2018-11-13 (×7): qty 50

## 2018-11-13 MED ORDER — INSULIN ASPART 100 UNIT/ML ~~LOC~~ SOLN
0.0000 [IU] | SUBCUTANEOUS | Status: DC
  Administered 2018-11-13 – 2018-11-18 (×3): 1 [IU] via SUBCUTANEOUS

## 2018-11-13 NOTE — Progress Notes (Signed)
eLink Physician-Brief Progress Note Patient Name: [name] DOB: 1946-06-27 MRN: [id number]   Date of Service  11/13/2018  HPI/Events of Note  Hypokalemia  eICU Interventions  Potassium replaced     Intervention Category Major Interventions: Electrolyte abnormality - evaluation and management  Lev Cervone 11/13/2018, 6:53 AM

## 2018-11-13 NOTE — Progress Notes (Signed)
RT obtained morning ABG on pt with the following results.  RT contacted Elink MD to report abnormal values for vent changes. Warren Lacy MD [place] had this RT change VT from 450 to 400 and pts Rate from 16 to 12 based on ABG results. RT will continue to monitor.    Ref. Range 11/13/2018 04:53  Sample type Unknown ARTERIAL  pH, Arterial Latest Ref Range: 7.350 - 7.450  7.543 (H)  pCO2 arterial Latest Ref Range: 32.0 - 48.0 mmHg 20.9 (L)  pO2, Arterial Latest Ref Range: 83.0 - 108.0 mmHg 172.0 (H)  TCO2 Latest Ref Range: 22 - 32 mmol/L 19 (L)  Acid-base deficit Latest Ref Range: 0.0 - 2.0 mmol/L 4.0 (H)  Bicarbonate Latest Ref Range: 20.0 - 28.0 mmol/L 18.6 (L)  O2 Saturation Latest Units: % 100.0  Patient temperature Unknown 33.0 C  Collection site Unknown RADIAL, ALLEN'S TEST ACCEPTABLE

## 2018-11-13 NOTE — Progress Notes (Signed)
[facility] Progress Note Patient Name: Abigail [name] DOB: 15-Jul-1946 MRN: [id number]   Date of Service  11/13/2018  HPI/Events of Note  Hypophos and Hypomag  eICU Interventions  Phos and mag replaced     Intervention Category Intermediate Interventions: Electrolyte abnormality - evaluation and management  DETERDING,ELIZABETH 11/13/2018, 3:05 AM

## 2018-11-13 NOTE — Progress Notes (Signed)
NAME:  Abigail [name], MRN:  [id number], DOB:  1946-07-26, LOS: 1 ADMISSION DATE:  11/12/2018, CONSULTATION DATE:  1/24 REFERRING MD:  Ashok Cordia, CHIEF COMPLAINT:  Cardiac arrest    Brief History   73 year old female with significant history of multiple myeloma, and most recently failure to thrive following recent hospital admission for infectious colitis and septic shock.  Admitted 1/24 status post VT/VF arrest.  Overall time to return of spontaneous circulation estimated at over 12 minutes Hypothermia protocol in place  History of present illness   This is a 73 year old female patient with multiple comorbidities as listed directly below.  She was most recently Discharged on 1/14 following a hospital admission for infectious colitis and resultant septic shock with multiorgan dysfunction.  She was treated in usual fashion with IV antibiotics, aggressive IV fluids, vasoactive drips.  She recovered to the point where she could return to home under the care of her sister where she has been significantly debilitated since that last hospitalization.  She presents to the emergency room on 1/23 via EMS.  She apparently was on the phone and suddenly stopped talking, she began to look off, was unresponsive, but apparently spontaneously breathing.  EMS was summoned, on EMS arrival she was found to be in to have a shockable rhythm.  ACLS was initiated she was defibrillated x3 times, CPR was initiated, initial return to spontaneous circulation estimated at 2 minutes.  She was intubated by EMS.  She again suffered another cardiac arrest (prior to arrival) this time return to return of spontaneous circulation was estimated at another 10 minutes.  Upon time of critical care arrival Dr. Doylene Canard was at bedside.  She was mechanically ventilated, maintaining circulating blood pressure spontaneously, but posturing with right sided gaze preference. To be taken off hypothermia protocol this afternoon  Past Medical History  Atrial  fibrillation, anemia of chronic disease with prior GI bleed, multiple myeloma currently on chemotherapy (last treated approximately 3 weeks ago) has not received chemotherapy due to recent hospitalization for dehydration and renal failure CKD stage III Moderate mitral valve regurgitation and tricuspid valve regurgitation Myasthenia gravis  Significant Hospital Events   1/24 admitted s/p VT/VF arrest/ shocked x3 in field. Intubated in field. Estimated time to ROSC ~12 minutes total including a second cardiac arrest in the ER. Made DNR on admit   Consults:  Cardiology Doylene Canard)  Procedures:  OETT 1/24>>>   Significant Diagnostic Tests:  CT brain 1/24:Negative for acute CT findings of brain injury.  She is got age-related atrophy, old small vessel infarction of the left frontal lobe, and lytic lesions which are stable throughout the calvarium  Micro Data:  Respiratory culture 1/24>>>  Antimicrobials:  None  Interim history/subjective:  Unresponsive   Objective   Blood pressure (!) 87/56, pulse (!) 53, temperature (!) 91 F (32.8 C), temperature source Esophageal, resp. rate 13, height _0  (1.651 m), weight 74 kg, SpO2 100 %. CVP:  [7 mmHg-10 mmHg] 8 mmHg  Vent Mode: PRVC FiO2 (%):  [40 %-60 %] 40 % Set Rate:  [12 bmp-16 bmp] 12 bmp Vt Set:  [400 mL-450 mL] 400 mL PEEP:  [5 cmH20] 5 cmH20 Plateau Pressure:  [14 cmH20-15 cmH20] 14 cmH20   Intake/Output Summary (Last 24 hours) at 11/13/2018 1645 Last data filed at 11/13/2018 1600 Gross per 24 hour  Intake 6297.88 ml  Output 2090 ml  Net 4207.88 ml   Filed Weights   11/12/18 1206  Weight: 74 kg    Examination:  General: Chronically ill-appearing 73 year old female currently unresponsive on full ventilator support, temperature controlled at 31 HENT: Normocephalic atraumatic  Lungs: Poor air movement no added sounds Cardiovascular: Systolic murmur, currently fib with controlled ventricular response Abdomen: Soft, not  tender Extremities: Cool extremities Neuro: Unresponsive GU: Clear yellow  Resolved Hospital Problem list   Recent infectious colitis  Assessment & Plan:  VT/VF cardiac arrest Plan Telemetry monitoring Amiodarone per cardiology No anticoagulation given recent GI bleeding Cycle cardiac enzymes Correct electrolytes Echocardiogram-ejection fraction of 20 to 25% with diffuse hypokinesis Consider cardiac catheterization only if has neurological recovery  Acute respiratory failure secondary to cardiac arrest Portable chest x-ray personally reviewed: Tube is adequately in place, no infiltrates  Plan Full ventilator support VAP bundle Weaning approach to be determined depending on outcome following rewarming  Acute metabolic encephalopathy, with concern for an anoxic brain injury Time to return of spontaneous circulation estimated total of at least 12 minutes Clinical exam worrisome as has rightward gaze preference and decerebrate posturing CT of the head did not reveal any significant acute abnormality EEG -no seizure on EEG Hypothermia protocol plan MRI brain if no improvement after warming complete  History of atrial fibrillation, coronary artery disease, mitral valve regurgitation and hypertension Plan Continuing telemetry No anticoagulation Amiodarone as above  Acute on chronic renal failure status post cardiac arrest -Baseline CKD stage III Plan IV hydration Renal dose medications Strict intake and output  Anion gap metabolic acidosis, suspect this is lactic acidosis following cardiopulmonary arrest -If lactic acid present this is unlikely sepsis and more likely status post cardiac arrest Plan Follow gases  Fluid and electrolyte imbalance: Profound hypokalemia Plan Replace and recheck  Mild elevated LFT -shock liver Plan Follow-up a.m. LFTs  Hyperglycemia Plan Sliding scale insulin  Multiple myeloma with diffuse lytic bone lesions: Last seen on 1/2 in  oncology. -Most recent Zometa infusion 10/08/2017 -Chemotherapy has been held due to debilitated state Plan Follow-up with oncology depending on neurological recovery I doubt her performance status will allow her to pursue further therapy  H/o myasthenia gravis Plan Cont mestinon Note current medications for hypothermia may exacerbate MG symptoms but think benefit outweighs risk given concern for anoxic injury   Best practice:  Diet: N.p.o. Pain/Anxiety/Delirium protocol (if indicated): hypothermia protocol  VAP protocol (if indicated): 1/24 DVT prophylaxis: [place] heparin GI prophylaxis: 1/24 Glucose control: ssi 1/24 Mobility: BR Code Status: DNR  Family Communication: No family at bedside  Disposition: 73 year old female who is critically ill status post VT/VF arrest.  She has a history of multiple myeloma however there is no evidence of metastasis to the brain.  She is had stable skull base lesions.  Her downtime was estimated at least 12 minutes Concern for anoxic injury, For now the plan will be to initiate the hypothermia protocol, provide supportive care Following hypothermia will contact neurology to assist with further prognostics if not more awake.  Following discussion with sister by Dr. Nelda Marseille, patient is a DO NOT RESUSCITATE should she have another cardiac arrest  She is for rewarming today  Critical care time spent evaluating and reviewing records, formulating plan of care 30 minutes  Labs   CBC: Recent Labs  Lab 11/12/18 1200 11/13/18 0150 11/13/18 0453  WBC 12.3* 15.5*  --   HGB 10.3* 9.1* 9.5*  HCT 36.1 29.3* 28.0*  MCV 102.6* 95.1  --   PLT 288 262  --     Basic Metabolic Panel: Recent Labs  Lab 11/12/18 1630 11/12/18 1905 11/12/18 2124  11/13/18 0150 11/13/18 0453 11/13/18 0557 11/13/18 1359  NA 140 140 142 142 142 142 142  K 2.8* 3.4* 3.7 3.9 2.8* 2.6* 4.0  CL 104 106 108 112*  --  111 117*  CO2 15* 19* 20* 18*  --  18* 19*  GLUCOSE 289*  154* 84 93  --  120* 95  BUN _0 --  8 7*  CREATININE 1.43* 1.29* 1.13* 1.13*  --  1.09* 1.07*  CALCIUM 8.2* 8.1* 8.1* 7.6*  --  7.6* 6.9*  MG 1.2*  --   --  1.0*  --   --   --   PHOS  --   --   --  <1.0*  --   --   --    GFR: Estimated Creatinine Clearance: 48.6 mL/min (A) (by C-G formula based on SCr of 1.07 mg/dL (H)). Recent Labs  Lab 11/12/18 1200 11/13/18 0150  WBC 12.3* 15.5*    Liver Function Tests: Recent Labs  Lab 11/12/18 1200 11/12/18 1905  AST 140* 150*  ALT 68* 66*  ALKPHOS 68 66  BILITOT 1.5* 1.3*  PROT 7.1 6.6  ALBUMIN 3.6 3.2*   No results for input(s): LIPASE, AMYLASE in the last 168 hours. No results for input(s): AMMONIA in the last 168 hours.  ABG    Component Value Date/Time   PHART 7.543 (H) 11/13/2018 0453   PCO2ART 20.9 (L) 11/13/2018 0453   PO2ART 172.0 (H) 11/13/2018 0453   HCO3 18.6 (L) 11/13/2018 0453   TCO2 19 (L) 11/13/2018 0453   ACIDBASEDEF 4.0 (H) 11/13/2018 0453   O2SAT 100.0 11/13/2018 0453     Coagulation Profile: Recent Labs  Lab 11/12/18 1630 11/13/18 0617  INR 1.15 1.17    Cardiac Enzymes: Recent Labs  Lab 11/12/18 1630 11/12/18 1900 11/13/18 0136 11/13/18 0557  TROPONINI 0.65* 0.80* 0.92* 0.94*    HbA1C: Hgb A1c MFr Bld  Date/Time Value Ref Range Status  12/31/2011 06:41 AM 5.7 (H) <5.7 % Final    Comment:    (NOTE)                                                                       According to the ADA Clinical Practice Recommendations for 2011, when HbA1c is used as a screening test:  >=6.5%   Diagnostic of Diabetes Mellitus           (if abnormal result is confirmed) 5.7-6.4%   Increased risk of developing Diabetes Mellitus References:Diagnosis and Classification of Diabetes Mellitus,Diabetes EKCM,0349,17(HXTAV 1):S62-S69 and Standards of Medical Care in         Diabetes - 2011,Diabetes WPVX,4801,65 (Suppl 1):S11-S61.    CBG: Recent Labs  Lab 11/13/18 0750 11/13/18 0957 11/13/18 1152  11/13/18 1405 11/13/18 1602  GLUCAP 122* 111* 92 88 97    Review of Systems:   Not able  Past Medical History  She,  has a past medical history of ACS (acute coronary syndrome) ([place]) (06/2017), Anemia, Anxiety, Bilateral renal artery stenosis ([place]) (1999), Chronic kidney disease (CKD), stage III (moderate) ([place]), Coronary artery disease (1999), Depression, Diverticulosis, Duodenitis, DVT (deep venous thrombosis) ([place]), Dyspnea, Gastric erosions, Gastric ulcer, GERD (gastroesophageal reflux disease), Heart murmur, History of blood transfusion (~ 03/2016),  Hyperlipidemia, Hypertension, MI (myocardial infarction) ([place]) (1999), Multiple myeloma ([place]) (dx'd ~ 04/2016), PAT (paroxysmal atrial tachycardia) ([place]) (12/22/2016), Retinal hemorrhage, right eye, Schatzki's ring, Tachy-brady syndrome ([place]), and Tubular adenoma of colon.   Surgical History    Past Surgical History:  Procedure Laterality Date  . APPENDECTOMY    . BACK SURGERY    . CATARACT EXTRACTION W/ INTRAOCULAR LENS IMPLANT Right 2014  . COLONOSCOPY WITH PROPOFOL N/A 09/04/2017   Procedure: COLONOSCOPY WITH PROPOFOL;  Surgeon: Doran Stabler, MD;  Location: [facility];  Service: Gastroenterology;  Laterality: N/A;  . CORONARY ANGIOGRAPHY N/A 01/23/2017   Procedure: Coronary Angiography;  Surgeon: [name], MD;  Location: [facility];  Service: Cardiovascular;  Laterality: N/A;  . CORONARY ANGIOPLASTY WITH STENT PLACEMENT  1999  . CORONARY STENT INTERVENTION N/A 12/29/2016   Procedure: Coronary Stent Intervention;  Surgeon: Charolette Forward, MD;  Location: [facility];  Service: Cardiovascular;  Laterality: N/A;  . CORONARY STENT INTERVENTION N/A 02/24/2017   Procedure: Coronary Stent Intervention;  Surgeon: Charolette Forward, MD;  Location: [facility];  Service: Cardiovascular;  Laterality: N/A;  . ESOPHAGOGASTRODUODENOSCOPY N/A 11/03/2015   Procedure: ESOPHAGOGASTRODUODENOSCOPY (EGD);  Surgeon: Carol Ada, MD;   Location: [facility] END[facility]COPY;  Service: Endoscopy;  Laterality: N/A;  . ESOPHAGOGASTRODUODENOSCOPY N/A 09/03/2017   Procedure: ESOPHAGOGASTRODUODENOSCOPY (EGD);  Surgeon: Doran Stabler, MD;  Location: [facility];  Service: Gastroenterology;  Laterality: N/A;  . ESOPHAGOGASTRODUODENOSCOPY (EGD) WITH PROPOFOL N/A 11/18/2016   Procedure: ESOPHAGOGASTRODUODENOSCOPY (EGD) WITH PROPOFOL;  Surgeon: Ladene Artist, MD;  [facility];  Service: Endoscopy;  Laterality: N/A;  . LEFT HEART CATH AND CORONARY ANGIOGRAPHY N/A 12/24/2016   Procedure: Left Heart Cath and Coronary Angiography;  Surgeon: [name], MD;  Location: [facility];  Service: Cardiovascular;  Laterality: N/A;  . LEFT HEART CATH AND CORONARY ANGIOGRAPHY N/A 02/24/2017   Procedure: Left Heart Cath and Coronary Angiography;  Surgeon: Charolette Forward, MD;  Location: [facility];  Service: Cardiovascular;  Laterality: N/A;  . LEFT HEART CATH AND CORONARY ANGIOGRAPHY N/A 03/05/2017   Procedure: Left Heart Cath and Coronary Angiography;  Surgeon: [name], MD;  Location: [facility];  Service: Cardiovascular;  Laterality: N/A;  . LEFT HEART CATH AND CORONARY ANGIOGRAPHY N/A 09/02/2018   Procedure: LEFT HEART CATH AND CORONARY ANGIOGRAPHY;  Surgeon: [name], MD;  Location: [facility];  Service: Cardiovascular;  Laterality: N/A;  . LUMBAR [place]    . RENAL ARTERY STENT  1999   bil RAS so ? bil vs unilateral stents  . RETINAL LASER PROCEDURE Right 2012   "bleeding"  . TONSILLECTOMY    . TOTAL ABDOMINAL HYSTERECTOMY       Social History   reports that she has been smoking cigarettes. She has a 25.00 pack-year smoking history. She has never used smokeless tobacco. She reports that she does not drink alcohol or use drugs.   Family History   Her family history includes Breast cancer in her maternal aunt, mother, and sister. There is no history of Colon cancer.   Allergies Allergies    Allergen Reactions  . Sulfa Antibiotics Rash

## 2018-11-13 NOTE — Progress Notes (Signed)
Initial Nutrition Assessment  DOCUMENTATION CODES:  Non-severe (moderate) malnutrition in context of chronic illness  INTERVENTION:  If nutrition support in line with goals of care, appropriate TF recommendations based on current parameters are as follows  Vital High Protain at goal rate of 40 ml/h (960 ml per day) and Prostat 30 ml BID to provide 1160 kcals (+293 from Diprivan), 114 gm protein, 803 ml free water daily.  NUTRITION DIAGNOSIS:  Moderate Malnutrition related to chronic illness as evidenced by loss of >10% bw in <4 months and moderate muscle/fat loss  GOAL:  Patient will meet greater than or equal to 90% of their needs  MONITOR:  PO intake, Supplement acceptance, Diet advancement, TF tolerance  REASON FOR ASSESSMENT:  Ventilator    ASSESSMENT:  73 y/o female PMHx Multiple Myeloma, currently on chemotherapy, ckd3, FTT, Afib, CAD. Had recently been admitted 1/3-1/14 for infectious colitis. Now presents via EMS after suffering 2x cardiac arrest, first w/ ROSC 2 min and second ROSC 10 min. Intubated in field and begun on hypothermia protocol on arrival  Pt intubated, paralyzed, sedated. No family/friends present. She is now undergoing rewarming.   Per chart, this is pts 7th hospital admission since November. Wt at that time appears to be ~175-180 lbs. While unsure what her true wt is (has substantial edema on exam), she was 150 lbs at the start of this month-will use as estimated dry wt. She is estimated to have atleast 10% of her BW since November.   Though pt has poor prognosis, will leave TF recs in event TF nutrition support is desired  Patient is currently intubated on ventilator support MV: 4.7 L/min Temp (24hrs), Avg:91.4 F (33 C), Min:90.3 F (32.4 C), Max:93.4 F (34.1 C) Propofol: 11.1= 293 kcals/day  Labs: Bgs: 85-115, Bun/Creat:7/1.11, mag: 1, phos:<1, wbc: 15.5 Meds: Nimbex, iv ppi, IVF, kcl Sedation/analgesia: fentanyl, propofol,  Pressor/inotropic  support: Levophed  Recent Labs  Lab 11/12/18 1630  11/13/18 0150  11/13/18 0557 11/13/18 1359 11/13/18 1825  NA 140   < > 142   < > 142 142 144  K 2.8*   < > 3.9   < > 2.6* 4.0 3.8  CL 104   < > 112*  --  111 117* 115*  CO2 15*   < > 18*  --  18* 19* 18*  BUN 10   < > 8  --  8 7* 7*  CREATININE 1.43*   < > 1.13*  --  1.09* 1.07* 1.11*  CALCIUM 8.2*   < > 7.6*  --  7.6* 6.9* 7.0*  MG 1.2*  --  1.0*  --   --   --   --   PHOS  --   --  <1.0*  --   --   --   --   GLUCOSE 289*   < > 93  --  120* 95 109*   < > = values in this interval not displayed.   NUTRITION - FOCUSED PHYSICAL EXAM:   Most Recent Value  Orbital Region  Moderate depletion  Upper Arm Region  Mild depletion  Thoracic and Lumbar Region  Moderate depletion  Buccal Region  Mild depletion  Temple Region  Moderate depletion  Clavicle Bone Region  Moderate depletion  Clavicle and Acromion Bone Region  Moderate depletion  Scapular Bone Region  Unable to assess  Dorsal Hand  Unable to assess  Patellar Region  Unable to assess  Anterior Thigh Region  Unable to assess  Posterior Calf Region  Unable to assess  Edema (RD Assessment)  Severe  Hair  Reviewed  Eyes  Reviewed  Mouth  Reviewed  Skin  Reviewed       Diet Order:   Diet Order    None     EDUCATION NEEDS:  No education needs have been identified at this time  Skin:  Skin Assessment: Reviewed RN Assessment  Last BM:  Unknown  Height:  Ht Readings from Last 1 Encounters:  11/12/18 '5\' 5"'  (1.651 m)   Weight:  Wt Readings from Last 1 Encounters:  11/13/18 73.4 kg   Wt Readings from Last 10 Encounters:  11/13/18 73.4 kg  11/02/18 74.1 kg  10/21/18 67.5 kg  10/19/18 68.1 kg  10/10/18 71.1 kg  10/07/18 70.8 kg  10/03/18 71.8 kg  09/30/18 70.8 kg  09/24/18 73.5 kg  09/17/18 71.3 kg   Ideal Body Weight:  56.82 kg  BMI:  Body mass index is 26.93 kg/m.  Using dry wt: 150 lbs (68.18 kg) Estimated Nutritional Needs:  Kcal:  1610 (PSU 2003  b) Protein:  110-130g Pro (1.6-1.9g/kg bw) Fluid:  Per MD management  Burtis Junes RD, LDN, CNSC Clinical Nutrition Available Tues-Sat via Pager: [phone] 11/13/2018 7:56 PM

## 2018-11-13 NOTE — Consult Note (Signed)
Ref: Nolene Ebbs, MD   Subjective:  Intubated, sedated and under hypothermia protocol. Abnormal troponin I from demand ischemia. Hypokalemia persist. Hgb 9.1 g. WBC 15.5. Echocardiogram with generalized hypokinesia EF 20 % on inotropic support.  Monitor shows sinus bradycardia.   Objective:  Vital Signs in the last 24 hours: Temp:  [89.1 F (31.7 C)-94.4 F (34.7 C)] 91.9 F (33.3 C) (01/25 0600) Pulse Rate:  [35-88] 46 (01/25 1000) Cardiac Rhythm: (P) Sinus bradycardia;Junctional rhythm (01/25 0800) Resp:  [11-23] 12 (01/25 1000) BP: (69-204)/(53-175) 112/76 (01/25 1000) SpO2:  [76 %-100 %] 100 % (01/25 1000) FiO2 (%):  [40 %-100 %] (P) 40 % (01/25 0800) Weight:  [74 kg] 74 kg (01/24 1206)  Physical Exam: BP Readings from Last 1 Encounters:  11/13/18 112/76     Wt Readings from Last 1 Encounters:  11/12/18 74 kg    Weight change:  Body mass index is 27.15 kg/m. HEENT: [place]/AT, Eyes-Brown, Conjunctiva-Pale pink, Sclera-Non-icteric Neck: No JVD, No bruit, Trachea midline. Lungs:  Clearing, Bilateral. Cardiac:  Regular brady rhythm, normal S1 and S2, no S3. II/VI systolic murmur. Abdomen:  Soft. Extremities:  No edema present. No cyanosis. No clubbing. CNS: AxOx0.,   Skin: Cool and dry.   Intake/Output from previous day: 01/24 0701 - 01/25 0700 In: 4144.5 [I.V.:2851.2; NG/GT:60; IV Piggyback:1233.3] Out: 1860 [Urine:1560; Emesis/NG output:300]    Lab Results: BMET    Component Value Date/Time   NA 142 11/13/2018 0557   NA 142 11/13/2018 0453   NA 142 11/13/2018 0150   NA 144 09/17/2017 1429   NA 141 07/30/2017 1504   NA 142 06/11/2017 1451   K 2.6 (LL) 11/13/2018 0557   K 2.8 (L) 11/13/2018 0453   K 3.9 11/13/2018 0150   K 3.1 (L) 09/17/2017 1429   K 4.8 07/30/2017 1504   K 4.3 06/11/2017 1451   CL 111 11/13/2018 0557   CL 112 (H) 11/13/2018 0150   CL 108 11/12/2018 2124   CO2 18 (L) 11/13/2018 0557   CO2 18 (L) 11/13/2018 0150   CO2 20 (L)  11/12/2018 2124   CO2 23 09/17/2017 1429   CO2 20 (L) 07/30/2017 1504   CO2 20 (L) 06/11/2017 1451   GLUCOSE 120 (H) 11/13/2018 0557   GLUCOSE 93 11/13/2018 0150   GLUCOSE 84 11/12/2018 2124   GLUCOSE 96 09/17/2017 1429   GLUCOSE 79 07/30/2017 1504   GLUCOSE 74 06/11/2017 1451   BUN 8 11/13/2018 0557   BUN 8 11/13/2018 0150   BUN 8 11/12/2018 2124   BUN 15.6 09/17/2017 1429   BUN 24.4 07/30/2017 1504   BUN 18.7 06/11/2017 1451   CREATININE 1.09 (H) 11/13/2018 0557   CREATININE 1.13 (H) 11/13/2018 0150   CREATININE 1.13 (H) 11/12/2018 2124   CREATININE 1.97 (H) 10/21/2018 1055   CREATININE 1.36 (H) 10/07/2018 0823   CREATININE 1.41 (H) 09/30/2018 1005   CREATININE 1.0 09/17/2017 1429   CREATININE 1.2 (H) 07/30/2017 1504   CREATININE 1.2 (H) 06/11/2017 1451   CALCIUM 7.6 (L) 11/13/2018 0557   CALCIUM 7.6 (L) 11/13/2018 0150   CALCIUM 8.1 (L) 11/12/2018 2124   CALCIUM 8.6 09/17/2017 1429   CALCIUM 9.1 07/30/2017 1504   CALCIUM 9.6 06/11/2017 1451   GFRNONAA 51 (L) 11/13/2018 0557   GFRNONAA 49 (L) 11/13/2018 0150   GFRNONAA 49 (L) 11/12/2018 2124   GFRNONAA 25 (L) 10/21/2018 1055   GFRNONAA 39 (L) 10/07/2018 0823   GFRNONAA 37 (L) 09/30/2018 1005   GFRAA  59 (L) 11/13/2018 0557   GFRAA 57 (L) 11/13/2018 0150   GFRAA 57 (L) 11/12/2018 2124   GFRAA 29 (L) 10/21/2018 1055   GFRAA 45 (L) 10/07/2018 0823   GFRAA 43 (L) 09/30/2018 1005   CBC    Component Value Date/Time   WBC 15.5 (H) 11/13/2018 0150   RBC 3.08 (L) 11/13/2018 0150   HGB 9.5 (L) 11/13/2018 0453   HGB 9.1 (L) 10/21/2018 1055   HGB 11.3 (L) 09/17/2017 1428   HCT 28.0 (L) 11/13/2018 0453   HCT 35.8 09/17/2017 1428   PLT 262 11/13/2018 0150   PLT 271 10/21/2018 1055   PLT 231 09/17/2017 1428   MCV 95.1 11/13/2018 0150   MCV 93.2 09/17/2017 1428   MCH 29.5 11/13/2018 0150   MCHC 31.1 11/13/2018 0150   RDW 19.3 (H) 11/13/2018 0150   RDW 16.5 (H) 09/17/2017 1428   LYMPHSABS 0.9 10/29/2018 0419    LYMPHSABS 0.7 (L) 09/17/2017 1428   MONOABS 2.0 (H) 10/29/2018 0419   MONOABS 0.5 09/17/2017 1428   EOSABS 0.1 10/29/2018 0419   EOSABS 0.1 09/17/2017 1428   BASOSABS 0.0 10/29/2018 0419   BASOSABS 0.0 09/17/2017 1428   HEPATIC Function Panel Recent Labs    10/23/18 0335 11/12/18 1200 11/12/18 1905  PROT 5.7* 7.1 6.6   HEMOGLOBIN A1C No components found for: HGA1C,  MPG CARDIAC ENZYMES Lab Results  Component Value Date   CKTOTAL 52 08/21/2017   CKMB 1.3 08/21/2017   TROPONINI 0.94 (HH) 11/13/2018   TROPONINI 0.92 (HH) 11/13/2018   TROPONINI 0.80 (HH) 11/12/2018   BNP No results for input(s): PROBNP in the last 8760 hours. TSH Recent Labs    12/13/17 2243 10/02/18 1825  TSH 1.922 0.751   CHOLESTEROL Recent Labs    12/14/17 0457 09/02/18 0152 10/03/18 0637  CHOL 167 143 136    Scheduled Meds: . artificial tears  1 application Both Eyes L9J  . chlorhexidine gluconate (MEDLINE KIT)  15 mL Mouth Rinse BID  . fentaNYL (SUBLIMAZE) injection  50 mcg Intravenous Once  . heparin  5,000 Units Subcutaneous Q8H  . insulin aspart  0-9 Units Subcutaneous Q4H  . mouth rinse  15 mL Mouth Rinse 10 times per day  . pantoprazole (PROTONIX) IV  40 mg Intravenous QHS   Continuous Infusions: . sodium chloride 125 mL/hr at 11/13/18 0953  . amiodarone 30 mg/hr (11/13/18 0700)  . cisatracurium (NIMBEX) infusion 1 mcg/kg/min (11/13/18 0700)  . fentaNYL infusion INTRAVENOUS 100 mcg/hr (11/13/18 1004)  . norepinephrine (LEVOPHED) Adult infusion 5 mcg/min (11/13/18 0700)  . potassium chloride 10 mEq (11/13/18 1001)  . propofol (DIPRIVAN) infusion 35 mcg/kg/min (11/13/18 0700)   PRN Meds:.[COMPLETED] cisatracurium **AND** cisatracurium (NIMBEX) infusion **AND** cisatracurium, fentaNYL, hydrALAZINE  Assessment/Plan: V. Fib cardiac arrest Hypoxic encephalopathy Multiple myeloma CAD LAD and LCx stents Anemia of chronic disease  Continue current treatment. Patient is DNR  now. Prognosis is poor.   LOS: 1 day    [name]  MD  11/13/2018, 10:28 AM

## 2018-11-14 ENCOUNTER — Other Ambulatory Visit: Payer: Self-pay | Admitting: Oncology

## 2018-11-14 LAB — BASIC METABOLIC PANEL
Anion gap: 8 (ref 5–15)
Anion gap: 6 (ref 5–15)
Anion gap: 9 (ref 5–15)
Anion gap: 11 (ref 5–15)
Anion gap: 10 (ref 5–15)
BUN: 7 mg/dL — ABNORMAL LOW (ref 8–23)
BUN: 7 mg/dL — ABNORMAL LOW (ref 8–23)
BUN: 5 mg/dL — ABNORMAL LOW (ref 8–23)
BUN: 5 mg/dL — AB (ref 8–23)
BUN: <5 mg/dL — ABNORMAL LOW (ref 8–23)
CHLORIDE: 118 mmol/L — AB (ref 98–111)
CO2: 20 mmol/L — ABNORMAL LOW (ref 22–32)
CO2: 20 mmol/L — ABNORMAL LOW (ref 22–32)
CO2: 17 mmol/L — ABNORMAL LOW (ref 22–32)
CO2: 16 mmol/L — ABNORMAL LOW (ref 22–32)
CO2: 16 mmol/L — ABNORMAL LOW (ref 22–32)
CREATININE: 1.29 mg/dL — AB (ref 0.44–1.00)
Calcium: 6.9 mg/dL — ABNORMAL LOW (ref 8.9–10.3)
Calcium: 6.7 mg/dL — ABNORMAL LOW (ref 8.9–10.3)
Calcium: 6.8 mg/dL — ABNORMAL LOW (ref 8.9–10.3)
Calcium: 6.7 mg/dL — ABNORMAL LOW (ref 8.9–10.3)
Calcium: 6.7 mg/dL — ABNORMAL LOW (ref 8.9–10.3)
Chloride: 118 mmol/L — ABNORMAL HIGH (ref 98–111)
Chloride: 118 mmol/L — ABNORMAL HIGH (ref 98–111)
Chloride: 117 mmol/L — ABNORMAL HIGH (ref 98–111)
Chloride: 118 mmol/L — ABNORMAL HIGH (ref 98–111)
Creatinine, Ser: 1.17 mg/dL — ABNORMAL HIGH (ref 0.44–1.00)
Creatinine, Ser: 1.15 mg/dL — ABNORMAL HIGH (ref 0.44–1.00)
Creatinine, Ser: 1.19 mg/dL — ABNORMAL HIGH (ref 0.44–1.00)
Creatinine, Ser: 1.34 mg/dL — ABNORMAL HIGH (ref 0.44–1.00)
GFR calc Af Amer: 54 mL/min — ABNORMAL LOW (ref >60)
GFR calc Af Amer: 55 mL/min — ABNORMAL LOW (ref >60)
GFR calc Af Amer: 53 mL/min — ABNORMAL LOW (ref >60)
GFR calc Af Amer: 46 mL/min — ABNORMAL LOW (ref >60)
GFR calc non Af Amer: 47 mL/min — ABNORMAL LOW (ref >60)
GFR calc non Af Amer: 48 mL/min — ABNORMAL LOW (ref >60)
GFR calc non Af Amer: 46 mL/min — ABNORMAL LOW (ref >60)
GFR calc non Af Amer: 42 mL/min — ABNORMAL LOW (ref >60)
GFR calc non Af Amer: 40 mL/min — ABNORMAL LOW (ref >60)
GFR, EST AFRICAN AMERICAN: 48 mL/min — AB (ref >60)
GLUCOSE: 112 mg/dL — AB (ref 70–99)
Glucose, Bld: 116 mg/dL — ABNORMAL HIGH (ref 70–99)
Glucose, Bld: 112 mg/dL — ABNORMAL HIGH (ref 70–99)
Glucose, Bld: 103 mg/dL — ABNORMAL HIGH (ref 70–99)
Glucose, Bld: 109 mg/dL — ABNORMAL HIGH (ref 70–99)
POTASSIUM: 3.7 mmol/L (ref 3.5–5.1)
Potassium: 3.7 mmol/L (ref 3.5–5.1)
Potassium: 3.6 mmol/L (ref 3.5–5.1)
Potassium: 3.8 mmol/L (ref 3.5–5.1)
Potassium: 3.8 mmol/L (ref 3.5–5.1)
Sodium: 146 mmol/L — ABNORMAL HIGH (ref 135–145)
Sodium: 144 mmol/L (ref 135–145)
Sodium: 144 mmol/L (ref 135–145)
Sodium: 144 mmol/L (ref 135–145)
Sodium: 144 mmol/L (ref 135–145)

## 2018-11-14 LAB — GLUCOSE, CAPILLARY
Glucose-Capillary: 105 mg/dL — ABNORMAL HIGH (ref 70–99)
Glucose-Capillary: 110 mg/dL — ABNORMAL HIGH (ref 70–99)
Glucose-Capillary: 73 mg/dL (ref 70–99)
Glucose-Capillary: 86 mg/dL (ref 70–99)
Glucose-Capillary: 88 mg/dL (ref 70–99)
Glucose-Capillary: 97 mg/dL (ref 70–99)
Glucose-Capillary: 98 mg/dL (ref 70–99)
Glucose-Capillary: 99 mg/dL (ref 70–99)

## 2018-11-14 LAB — CBC WITH DIFFERENTIAL/PLATELET
Abs Immature Granulocytes: 0.61 10*3/uL — ABNORMAL HIGH (ref 0.00–0.07)
Basophils Absolute: 0.1 10*3/uL (ref 0.0–0.1)
Basophils Relative: 0 %
Eosinophils Absolute: 0 10*3/uL (ref 0.0–0.5)
Eosinophils Relative: 0 %
HCT: 31.2 % — ABNORMAL LOW (ref 36.0–46.0)
Hemoglobin: 9.4 g/dL — ABNORMAL LOW (ref 12.0–15.0)
Immature Granulocytes: 4 %
Lymphocytes Relative: 3 %
Lymphs Abs: 0.6 10*3/uL — ABNORMAL LOW (ref 0.7–4.0)
MCH: 30.2 pg (ref 26.0–34.0)
MCHC: 30.1 g/dL (ref 30.0–36.0)
MCV: 100.3 fL — AB (ref 80.0–100.0)
MONOS PCT: 11 %
Monocytes Absolute: 1.8 10*3/uL — ABNORMAL HIGH (ref 0.1–1.0)
Neutro Abs: 13.5 10*3/uL — ABNORMAL HIGH (ref 1.7–7.7)
Neutrophils Relative %: 82 %
Platelets: 226 10*3/uL (ref 150–400)
RBC: 3.11 MIL/uL — ABNORMAL LOW (ref 3.87–5.11)
RDW: 20.4 % — ABNORMAL HIGH (ref 11.5–15.5)
WBC: 16.5 10*3/uL — ABNORMAL HIGH (ref 4.0–10.5)
nRBC: 1.8 % — ABNORMAL HIGH (ref 0.0–0.2)

## 2018-11-14 LAB — HEPATIC FUNCTION PANEL
ALT: 38 U/L (ref 0–44)
AST: 50 U/L — ABNORMAL HIGH (ref 15–41)
Albumin: 2.4 g/dL — ABNORMAL LOW (ref 3.5–5.0)
Alkaline Phosphatase: 66 U/L (ref 38–126)
BILIRUBIN INDIRECT: 0.6 mg/dL (ref 0.3–0.9)
Bilirubin, Direct: 0.3 mg/dL — ABNORMAL HIGH (ref 0.0–0.2)
Total Bilirubin: 0.9 mg/dL (ref 0.3–1.2)
Total Protein: 5.2 g/dL — ABNORMAL LOW (ref 6.5–8.1)

## 2018-11-14 LAB — PHOSPHORUS

## 2018-11-14 MED ORDER — ALTEPLASE 2 MG IJ SOLR
2.0000 mg | Freq: Once | INTRAMUSCULAR | Status: AC
  Administered 2018-11-14: 2 mg

## 2018-11-14 MED ORDER — PYRIDOSTIGMINE BROMIDE 60 MG/5ML PO SOLN
30.0000 mg | Freq: Two times a day (BID) | ORAL | Status: DC
  Administered 2018-11-14 – 2018-11-15 (×2): 30 mg
  Filled 2018-11-14 (×4): qty 2.5

## 2018-11-14 NOTE — Progress Notes (Signed)
RN called d/t pt agitated and diaphoretic and looks "tired".  Pt placed on full vent support, RN aware.

## 2018-11-14 NOTE — Progress Notes (Signed)
NAME:  Abigail [name], MRN:  [id number], DOB:  1946-04-02, LOS: 2 ADMISSION DATE:  11/12/2018, CONSULTATION DATE:  1/24 REFERRING MD:  Ashok Cordia, CHIEF COMPLAINT:  Cardiac arrest    Brief History   73 year old female with significant history of multiple myeloma, and most recently failure to thrive following recent hospital admission for infectious colitis and septic shock.  Admitted 1/24 status post VT/VF arrest.  Overall time to return of spontaneous circulation estimated at over 12 minutes Hypothermia protocol in place  History of present illness   This is a 73 year old female patient with multiple comorbidities as listed directly below.  She was most recently Discharged on 1/14 following a hospital admission for infectious colitis and resultant septic shock with multiorgan dysfunction.  She was treated in usual fashion with IV antibiotics, aggressive IV fluids, vasoactive drips.  She recovered to the point where she could return to home under the care of her sister where she has been significantly debilitated since that last hospitalization.  She presents to the emergency room on 1/23 via EMS.  She apparently was on the phone and suddenly stopped talking, she began to look off, was unresponsive, but apparently spontaneously breathing.  EMS was summoned, on EMS arrival she was found to be in to have a shockable rhythm.  ACLS was initiated she was defibrillated x3 times, CPR was initiated, initial return to spontaneous circulation estimated at 2 minutes.  She was intubated by EMS.  She again suffered another cardiac arrest (prior to arrival) this time return to return of spontaneous circulation was estimated at another 10 minutes.  Upon time of critical care arrival Dr. Doylene Canard was at bedside.  She was mechanically ventilated, maintaining circulating blood pressure spontaneously, but posturing with right sided gaze preference. To be taken off hypothermia protocol this afternoon  Past Medical History  Atrial  fibrillation, anemia of chronic disease with prior GI bleed, multiple myeloma currently on chemotherapy (last treated approximately 3 weeks ago) has not received chemotherapy due to recent hospitalization for dehydration and renal failure CKD stage III Moderate mitral valve regurgitation and tricuspid valve regurgitation Myasthenia gravis  Significant Hospital Events   1/24 admitted s/p VT/VF arrest/ shocked x3 in field. Intubated in field. Estimated time to ROSC ~12 minutes total including a second cardiac arrest in the ER. Made DNR on admit   Consults:  Cardiology Doylene Canard)  Procedures:  OETT 1/24>>>   Significant Diagnostic Tests:  CT brain 1/24:Negative for acute CT findings of brain injury.  She is got age-related atrophy, old small vessel infarction of the left frontal lobe, and lytic lesions which are stable throughout the calvarium TTE 1/25 >> normal LV size, akinesis of the apical myocardium, hypokinesis anterior, lateral, inferior myocardium, LVEF 20 to 25%, dilated LA, moderately dilated RV  Micro Data:  Respiratory culture 1/24>>>  Antimicrobials:  None  Interim history/subjective:  She reached 37C at 8:00 this morning, sedation is being titrated down Remains on norepinephrine 12, amiodarone Marginal urine output reported, 6.5 L positive for the hospitalization  Objective   Blood pressure (!) 104/56, pulse (!) 59, temperature 98.1 F (36.7 C), resp. rate (!) 23, height '5\' 5"'  (1.651 m), weight 73.4 kg, SpO2 99 %. CVP:  [8 mmHg-10 mmHg] 10 mmHg  Vent Mode: PRVC FiO2 (%):  [30 %-40 %] 30 % Set Rate:  [12 bmp] 12 bmp Vt Set:  [400 mL] 400 mL PEEP:  [5 cmH20] 5 cmH20 Plateau Pressure:  [14 cmH20-15 cmH20] 14 cmH20   Intake/Output  Summary (Last 24 hours) at 11/14/2018 0819 Last data filed at 11/14/2018 0700 Gross per 24 hour  Intake 4905.92 ml  Output 875 ml  Net 4030.92 ml   Filed Weights   11/12/18 1206 11/13/18 1936  Weight: 74 kg 73.4 kg     Examination: General: Chronically ill-appearing woman, ventilated, no distress HENT: ET tube in place, oropharynx clear, pupils equal and react to light Lungs: Coarse bilateral breath sounds, no wheezing Cardiovascular: Irregularly irregular, 2 out of 6 systolic murmur.  Right lower extremity edema, none on the left Abdomen: Soft, benign, nondistended with positive bowel sounds Extremities: Cool extremities, no deformities, IO line was in the right shin, wound healed Neuro: Opens eyes to voice but did not track, did not follow commands, no movement of any extremities.  Positive cough reflex.  Toes upgoing GU: Clear yellow  Resolved Hospital Problem list   Recent infectious colitis  Assessment & Plan:  VT/VF cardiac arrest Plan Telemetry monitoring Amiodarone infusion as ordered Anticoagulation deferred given recent GI blood loss Troponin peak 0.94 Remains on norepinephrine, wean as able Would only be a candidate for cardiac catheterization if she were to have neuro recovery  Acute respiratory failure secondary to cardiac arrest  Plan Continue current ventilator support.  May be able to move to spontaneous breathing trials depending on neurological exam when sedation lifted VAP prevention orders Chest x-ray 2/19   Acute metabolic encephalopathy, with concern for an anoxic brain injury Time to return of spontaneous circulation estimated total of at least 12 minutes Clinical exam worrisome as has rightward gaze preference and decerebrate posturing CT of the head did not reveal any significant acute abnormality EEG -no seizure on EEG plan Hypothermia protocol now completed Consider MRI brain, neurology consultation depending on her neurological status when sedation has been lifted.  Currently weaning  History of atrial fibrillation, coronary artery disease, mitral valve regurgitation and hypertension Plan Amiodarone as above Telemetry monitoring  Acute on chronic renal  failure status post cardiac arrest -Baseline CKD stage III Plan Decrease IV fluids 1/26, now 6.5 L positive Strict I/O  Anion gap metabolic acidosis, suspect this is lactic acidosis following cardiopulmonary arrest Plan Lactate cleared  Fluid and electrolyte imbalance: Profound hypokalemia Plan Follow BMP, urine output Replace electrolytes as indicated  Mild elevated LFT -shock liver Plan Improved  Hyperglycemia Plan Sliding scale insulin as ordered  Multiple myeloma with diffuse lytic bone lesions: Last seen on 1/2 in oncology. -Most recent Zometa infusion 10/08/2017 -Chemotherapy has been held due to debilitated state Plan Likely not in a position to have any further therapy.  Consider oncology follow-up if she has a neurological recovery  H/o myasthenia gravis Plan Mestinon is on hold.  Consider restart soon now that paralytics and sedation are being lightened.  I will plan to restart 1/26 afternoon  Best practice:  Diet: N.p.o. Pain/Anxiety/Delirium protocol (if indicated): hypothermia protocol  VAP protocol (if indicated): 1/24 DVT prophylaxis: [place] heparin GI prophylaxis: 1/24 Glucose control: ssi 1/24 Mobility: BR Code Status: DNR  Family Communication: she has two sisters that we will plan to update once sedation lifting, not present currently  Following discussion with sister by Dr. Nelda Marseille, patient is a DO NOT RESUSCITATE should she have another cardiac arrest   Labs   CBC: Recent Labs  Lab 11/12/18 1200 11/12/18 1741 11/13/18 0150 11/13/18 0453 11/14/18 0400  WBC 12.3*  --  15.5*  --  16.5*  NEUTROABS  --   --   --   --  13.5*  HGB 10.3* 10.9* 9.1* 9.5* 9.4*  HCT 36.1 32.0* 29.3* 28.0* 31.2*  MCV 102.6*  --  95.1  --  100.3*  PLT 288  --  262  --  580    Basic Metabolic Panel: Recent Labs  Lab 11/12/18 1630  11/13/18 0150  11/13/18 1955 11/13/18 2158 11/14/18 0008 11/14/18 0204 11/14/18 0400  NA 140   < > 142   < > 142 143 146* 144  144  K 2.8*   < > 3.9   < > 3.7 3.7 3.7 3.6 3.8  CL 104   < > 112*   < > 116* 117* 118* 118* 118*  CO2 15*   < > 18*   < > 18* 18* 20* 20* 17*  GLUCOSE 289*   < > 93   < > 108* 120* 112* 116* 112*  BUN 10   < > 8   < > 8 8 7* 7* 5*  CREATININE 1.43*   < > 1.13*   < > 1.18* 1.15* 1.17* 1.15* 1.19*  CALCIUM 8.2*   < > 7.6*   < > 6.9* 6.9* 6.9* 6.7* 6.8*  MG 1.2*  --  1.0*  --   --   --   --   --   --   PHOS  --   --  <1.0*  --   --   --   --   --   --    < > = values in this interval not displayed.   GFR: Estimated Creatinine Clearance: 43.5 mL/min (A) (by C-G formula based on SCr of 1.19 mg/dL (H)). Recent Labs  Lab 11/12/18 1200 11/13/18 0150 11/14/18 0400  WBC 12.3* 15.5* 16.5*    Liver Function Tests: Recent Labs  Lab 11/12/18 1200 11/12/18 1905 11/14/18 0400  AST 140* 150* 50*  ALT 68* 66* 38  ALKPHOS 68 66 66  BILITOT 1.5* 1.3* 0.9  PROT 7.1 6.6 5.2*  ALBUMIN 3.6 3.2* 2.4*   No results for input(s): LIPASE, AMYLASE in the last 168 hours. No results for input(s): AMMONIA in the last 168 hours.  ABG    Component Value Date/Time   PHART 7.543 (H) 11/13/2018 0453   PCO2ART 20.9 (L) 11/13/2018 0453   PO2ART 172.0 (H) 11/13/2018 0453   HCO3 18.6 (L) 11/13/2018 0453   TCO2 19 (L) 11/13/2018 0453   ACIDBASEDEF 4.0 (H) 11/13/2018 0453   O2SAT 100.0 11/13/2018 0453     Coagulation Profile: Recent Labs  Lab 11/12/18 1630 11/13/18 0617  INR 1.15 1.17    Cardiac Enzymes: Recent Labs  Lab 11/12/18 1630 11/12/18 1900 11/13/18 0136 11/13/18 0557  TROPONINI 0.65* 0.80* 0.92* 0.94*    HbA1C: Hgb A1c MFr Bld  Date/Time Value Ref Range Status  12/31/2011 06:41 AM 5.7 (H) <5.7 % Final    Comment:    (NOTE)                                                                       According to the ADA Clinical Practice Recommendations for 2011, when HbA1c is used as a screening test:  >=6.5%   Diagnostic of Diabetes Mellitus           (if  abnormal result is  confirmed) 5.7-6.4%   Increased risk of developing Diabetes Mellitus References:Diagnosis and Classification of Diabetes Mellitus,Diabetes UPJS,3159,45(OPFYT 1):S62-S69 and Standards of Medical Care in         Diabetes - 2011,Diabetes [id number],2863,81 (Suppl 1):S11-S61.    CBG: Recent Labs  Lab 11/14/18 0007 11/14/18 0404 11/14/18 0626 11/14/18 0740 11/14/18 0744  GLUCAP 110* 99 98 73 86   Independent CC time 32 minutes   Baltazar Apo, MD, PhD 11/14/2018, 8:35 AM [facility] [phone] or if no answer [phone]

## 2018-11-14 NOTE — Consult Note (Signed)
Ref: Avbuere, Edwin, MD   Subjective:  Awake, not following commands. VS stable on Levophed drip. Intubated but weaned off sedation. Body temperature 37 degrees F.  Monitor is Sinus rhythm.   Objective:  Vital Signs in the last 24 hours: Temp:  [90.7 F (32.6 C)-98.1 F (36.7 C)] 98.1 F (36.7 C) (01/26 0700) Pulse Rate:  [29-70] 68 (01/26 0912) Cardiac Rhythm: Sinus bradycardia;Junctional rhythm;Atrial fibrillation (01/26 0000) Resp:  [12-26] 15 (01/26 0912) BP: (63-135)/(41-95) 104/56 (01/26 0715) SpO2:  [98 %-100 %] 100 % (01/26 0912) FiO2 (%):  [30 %-40 %] 30 % (01/26 0912) Weight:  [73.4 kg] 73.4 kg (01/25 1936)  Physical Exam: BP Readings from Last 1 Encounters:  11/14/18 (!) 104/56     Wt Readings from Last 1 Encounters:  11/13/18 73.4 kg    Weight change: -0.6 kg Body mass index is 26.93 kg/m. HEENT: [place]/AT, Eyes-Brown, Conjunctiva-Pink, Sclera-Non-icteric Neck: No JVD, No bruit, Trachea midline. Lungs:  Clearing, Bilateral. Cardiac:  Regular rhythm, normal S1 and S2, no S3. III/VI systolic murmur. Abdomen:  Soft. Extremities:  Trace edema present. No cyanosis. No clubbing. CNS: AxOx0, Cranial nerves grossly intact, Flaccid all 4 extremities.  Skin: Cool and dry.   Intake/Output from previous day: 01/25 0701 - 01/26 0700 In: 5210.8 [I.V.:4731; NG/GT:30; IV Piggyback:449.7] Out: 915 [Urine:665; Emesis/NG output:250]    Lab Results: BMET    Component Value Date/Time   NA 144 11/14/2018 0844   NA 144 11/14/2018 0606   NA 144 11/14/2018 0400   NA 144 09/17/2017 1429   NA 141 07/30/2017 1504   NA 142 06/11/2017 1451   K 3.7 11/14/2018 0844   K 3.8 11/14/2018 0606   K 3.8 11/14/2018 0400   K 3.1 (L) 09/17/2017 1429   K 4.8 07/30/2017 1504   K 4.3 06/11/2017 1451   CL 118 (H) 11/14/2018 0844   CL 117 (H) 11/14/2018 0606   CL 118 (H) 11/14/2018 0400   CO2 16 (L) 11/14/2018 0844   CO2 16 (L) 11/14/2018 0606   CO2 17 (L) 11/14/2018 0400   CO2 23  09/17/2017 1429   CO2 20 (L) 07/30/2017 1504   CO2 20 (L) 06/11/2017 1451   GLUCOSE 109 (H) 11/14/2018 0844   GLUCOSE 103 (H) 11/14/2018 0606   GLUCOSE 112 (H) 11/14/2018 0400   GLUCOSE 96 09/17/2017 1429   GLUCOSE 79 07/30/2017 1504   GLUCOSE 74 06/11/2017 1451   BUN 5 (L) 11/14/2018 0844   BUN <5 (L) 11/14/2018 0606   BUN 5 (L) 11/14/2018 0400   BUN 15.6 09/17/2017 1429   BUN 24.4 07/30/2017 1504   BUN 18.7 06/11/2017 1451   CREATININE 1.34 (H) 11/14/2018 0844   CREATININE 1.29 (H) 11/14/2018 0606   CREATININE 1.19 (H) 11/14/2018 0400   CREATININE 1.97 (H) 10/21/2018 1055   CREATININE 1.36 (H) 10/07/2018 0823   CREATININE 1.41 (H) 09/30/2018 1005   CREATININE 1.0 09/17/2017 1429   CREATININE 1.2 (H) 07/30/2017 1504   CREATININE 1.2 (H) 06/11/2017 1451   CALCIUM 6.7 (L) 11/14/2018 0844   CALCIUM 6.7 (L) 11/14/2018 0606   CALCIUM 6.8 (L) 11/14/2018 0400   CALCIUM 8.6 09/17/2017 1429   CALCIUM 9.1 07/30/2017 1504   CALCIUM 9.6 06/11/2017 1451   GFRNONAA 40 (L) 11/14/2018 0844   GFRNONAA 42 (L) 11/14/2018 0606   GFRNONAA 46 (L) 11/14/2018 0400   GFRNONAA 25 (L) 10/21/2018 1055   GFRNONAA 39 (L) 10/07/2018 0823   GFRNONAA 37 (L) 09/30/2018 1005     GFRAA 46 (L) 11/14/2018 0844   GFRAA 48 (L) 11/14/2018 0606   GFRAA 53 (L) 11/14/2018 0400   GFRAA 29 (L) 10/21/2018 1055   GFRAA 45 (L) 10/07/2018 0823   GFRAA 43 (L) 09/30/2018 1005   CBC    Component Value Date/Time   WBC 16.5 (H) 11/14/2018 0400   RBC 3.11 (L) 11/14/2018 0400   HGB 9.4 (L) 11/14/2018 0400   HGB 9.1 (L) 10/21/2018 1055   HGB 11.3 (L) 09/17/2017 1428   HCT 31.2 (L) 11/14/2018 0400   HCT 35.8 09/17/2017 1428   PLT 226 11/14/2018 0400   PLT 271 10/21/2018 1055   PLT 231 09/17/2017 1428   MCV 100.3 (H) 11/14/2018 0400   MCV 93.2 09/17/2017 1428   MCH 30.2 11/14/2018 0400   MCHC 30.1 11/14/2018 0400   RDW 20.4 (H) 11/14/2018 0400   RDW 16.5 (H) 09/17/2017 1428   LYMPHSABS 0.6 (L) 11/14/2018 0400    LYMPHSABS 0.7 (L) 09/17/2017 1428   MONOABS 1.8 (H) 11/14/2018 0400   MONOABS 0.5 09/17/2017 1428   EOSABS 0.0 11/14/2018 0400   EOSABS 0.1 09/17/2017 1428   BASOSABS 0.1 11/14/2018 0400   BASOSABS 0.0 09/17/2017 1428   HEPATIC Function Panel Recent Labs    11/12/18 1200 11/12/18 1905 11/14/18 0400  PROT 7.1 6.6 5.2*   HEMOGLOBIN A1C No components found for: HGA1C,  MPG CARDIAC ENZYMES Lab Results  Component Value Date   CKTOTAL 52 08/21/2017   CKMB 1.3 08/21/2017   TROPONINI 0.94 (HH) 11/13/2018   TROPONINI 0.92 (HH) 11/13/2018   TROPONINI 0.80 (HH) 11/12/2018   BNP No results for input(s): PROBNP in the last 8760 hours. TSH Recent Labs    12/13/17 2243 10/02/18 1825  TSH 1.922 0.751   CHOLESTEROL Recent Labs    12/14/17 0457 09/02/18 0152 10/03/18 0637  CHOL 167 143 136    Scheduled Meds: . artificial tears  1 application Both Eyes L2G  . chlorhexidine gluconate (MEDLINE KIT)  15 mL Mouth Rinse BID  . fentaNYL (SUBLIMAZE) injection  50 mcg Intravenous Once  . heparin  5,000 Units Subcutaneous Q8H  . insulin aspart  0-9 Units Subcutaneous Q4H  . mouth rinse  15 mL Mouth Rinse 10 times per day  . pantoprazole (PROTONIX) IV  40 mg Intravenous QHS  . pyridostigmine  30 mg Per Tube BID   Continuous Infusions: . sodium chloride 30 mL/hr (11/14/18 0845)  . amiodarone 30 mg/hr (11/14/18 0700)  . cisatracurium (NIMBEX) infusion Stopped (11/14/18 0428)  . fentaNYL infusion INTRAVENOUS 100 mcg/hr (11/14/18 0700)  . norepinephrine (LEVOPHED) Adult infusion 12 mcg/min (11/14/18 0722)  . propofol (DIPRIVAN) infusion 30 mcg/kg/min (11/14/18 0700)   PRN Meds:.[COMPLETED] cisatracurium **AND** cisatracurium (NIMBEX) infusion **AND** cisatracurium, fentaNYL, hydrALAZINE  Assessment/Plan: V. Fib cardiac arrest Acute respiratory failure with hypoxemia Hypoxic encephalopathy Multiple myeloma CAD LAD and LCx stent Ischemic cardiomyopathy Anemia of chronic  disease  Continue medical therapy. Respiratory weaning process when off sedation.   LOS: 2 days    [name]  MD  11/14/2018, 10:35 AM

## 2018-11-15 ENCOUNTER — Inpatient Hospital Stay (HOSPITAL_COMMUNITY): Payer: Medicare HMO

## 2018-11-15 DIAGNOSIS — I82409 Acute embolism and thrombosis of unspecified deep veins of unspecified lower extremity: Secondary | ICD-10-CM

## 2018-11-15 DIAGNOSIS — J810 Acute pulmonary edema: Secondary | ICD-10-CM

## 2018-11-15 DIAGNOSIS — E876 Hypokalemia: Secondary | ICD-10-CM

## 2018-11-15 LAB — BASIC METABOLIC PANEL
Anion gap: 9 (ref 5–15)
Anion gap: 10 (ref 5–15)
BUN: 11 mg/dL (ref 8–23)
BUN: 11 mg/dL (ref 8–23)
CHLORIDE: 121 mmol/L — AB (ref 98–111)
CHLORIDE: 118 mmol/L — AB (ref 98–111)
CO2: 15 mmol/L — ABNORMAL LOW (ref 22–32)
CO2: 14 mmol/L — ABNORMAL LOW (ref 22–32)
CREATININE: 1.66 mg/dL — AB (ref 0.44–1.00)
Calcium: 7 mg/dL — ABNORMAL LOW (ref 8.9–10.3)
Calcium: 7.3 mg/dL — ABNORMAL LOW (ref 8.9–10.3)
Creatinine, Ser: 1.64 mg/dL — ABNORMAL HIGH (ref 0.44–1.00)
GFR calc Af Amer: 36 mL/min — ABNORMAL LOW (ref >60)
GFR calc Af Amer: 36 mL/min — ABNORMAL LOW (ref >60)
GFR calc non Af Amer: 31 mL/min — ABNORMAL LOW (ref >60)
GFR calc non Af Amer: 31 mL/min — ABNORMAL LOW (ref >60)
Glucose, Bld: 104 mg/dL — ABNORMAL HIGH (ref 70–99)
Glucose, Bld: 112 mg/dL — ABNORMAL HIGH (ref 70–99)
POTASSIUM: 3.2 mmol/L — AB (ref 3.5–5.1)
POTASSIUM: 4.4 mmol/L (ref 3.5–5.1)
Sodium: 145 mmol/L (ref 135–145)
Sodium: 142 mmol/L (ref 135–145)

## 2018-11-15 LAB — CBC
HCT: 29 % — ABNORMAL LOW (ref 36.0–46.0)
Hemoglobin: 8.7 g/dL — ABNORMAL LOW (ref 12.0–15.0)
MCH: 30 pg (ref 26.0–34.0)
MCHC: 30 g/dL (ref 30.0–36.0)
MCV: 100 fL (ref 80.0–100.0)
Platelets: 201 10*3/uL (ref 150–400)
RBC: 2.9 MIL/uL — AB (ref 3.87–5.11)
RDW: 20.8 % — ABNORMAL HIGH (ref 11.5–15.5)
WBC: 16 10*3/uL — ABNORMAL HIGH (ref 4.0–10.5)
nRBC: 0.6 % — ABNORMAL HIGH (ref 0.0–0.2)

## 2018-11-15 LAB — PHOSPHORUS
PHOSPHORUS: 4.3 mg/dL (ref 2.5–4.6)
PHOSPHORUS: 4.2 mg/dL (ref 2.5–4.6)

## 2018-11-15 LAB — MAGNESIUM
Magnesium: 1.7 mg/dL (ref 1.7–2.4)
Magnesium: 2.1 mg/dL (ref 1.7–2.4)

## 2018-11-15 LAB — HEPATIC FUNCTION PANEL
ALT: 31 U/L (ref 0–44)
AST: 31 U/L (ref 15–41)
Albumin: 2.3 g/dL — ABNORMAL LOW (ref 3.5–5.0)
Alkaline Phosphatase: 83 U/L (ref 38–126)
Bilirubin, Direct: 0.3 mg/dL — ABNORMAL HIGH (ref 0.0–0.2)
Indirect Bilirubin: 0.5 mg/dL (ref 0.3–0.9)
Total Bilirubin: 0.8 mg/dL (ref 0.3–1.2)
Total Protein: 5.3 g/dL — ABNORMAL LOW (ref 6.5–8.1)

## 2018-11-15 LAB — GLUCOSE, CAPILLARY
GLUCOSE-CAPILLARY: 90 mg/dL (ref 70–99)
GLUCOSE-CAPILLARY: 93 mg/dL (ref 70–99)
GLUCOSE-CAPILLARY: 90 mg/dL (ref 70–99)
Glucose-Capillary: 116 mg/dL — ABNORMAL HIGH (ref 70–99)
Glucose-Capillary: 89 mg/dL (ref 70–99)
Glucose-Capillary: 93 mg/dL (ref 70–99)

## 2018-11-15 MED ORDER — ALTEPLASE 2 MG IJ SOLR
2.0000 mg | Freq: Once | INTRAMUSCULAR | Status: AC | PRN
  Administered 2018-11-15: 2 mg
  Filled 2018-11-15 (×2): qty 2

## 2018-11-15 MED ORDER — MAGNESIUM SULFATE 2 GM/50ML IV SOLN
2.0000 g | Freq: Once | INTRAVENOUS | Status: AC
  Administered 2018-11-15: 2 g via INTRAVENOUS
  Filled 2018-11-15: qty 50

## 2018-11-15 MED ORDER — POTASSIUM CHLORIDE 20 MEQ/15ML (10%) PO SOLN
40.0000 meq | Freq: Four times a day (QID) | ORAL | Status: AC
  Administered 2018-11-15: 40 meq via ORAL
  Filled 2018-11-15: qty 30

## 2018-11-15 MED ORDER — CHLORHEXIDINE GLUCONATE CLOTH 2 % EX PADS
6.0000 | MEDICATED_PAD | Freq: Every day | CUTANEOUS | Status: DC
  Administered 2018-11-16 – 2018-12-02 (×17): 6 via TOPICAL

## 2018-11-15 MED ORDER — POTASSIUM CHLORIDE CRYS ER 20 MEQ PO TBCR: 40.0000 meq | EXTENDED_RELEASE_TABLET | Freq: Four times a day (QID) | ORAL | Status: DC

## 2018-11-15 MED ORDER — VITAL HIGH PROTEIN PO LIQD: 1000.0000 mL | ORAL | Status: DC

## 2018-11-15 MED ORDER — POTASSIUM CHLORIDE 10 MEQ/100ML IV SOLN
10.0000 meq | INTRAVENOUS | Status: AC
  Administered 2018-11-15 (×4): 10 meq via INTRAVENOUS
  Filled 2018-11-15 (×4): qty 100

## 2018-11-15 MED ORDER — METHYLPREDNISOLONE SODIUM SUCC 125 MG IJ SOLR
125.0000 mg | Freq: Once | INTRAMUSCULAR | Status: AC
  Administered 2018-11-15: 125 mg via INTRAVENOUS
  Filled 2018-11-15: qty 2

## 2018-11-15 MED ORDER — HEPARIN (PORCINE) 25000 UT/250ML-% IV SOLN
750.0000 [IU]/h | INTRAVENOUS | Status: DC
  Administered 2018-11-15: 950 [IU]/h via INTRAVENOUS
  Filled 2018-11-15: qty 250

## 2018-11-15 MED ORDER — CHLORHEXIDINE GLUCONATE 0.12 % MT SOLN
15.0000 mL | Freq: Two times a day (BID) | OROMUCOSAL | Status: DC
  Administered 2018-11-15 – 2018-12-03 (×29): 15 mL via OROMUCOSAL
  Filled 2018-11-15 (×32): qty 15

## 2018-11-15 MED ORDER — SODIUM CHLORIDE 0.9% FLUSH
10.0000 mL | Freq: Two times a day (BID) | INTRAVENOUS | Status: DC
  Administered 2018-11-15 – 2018-11-17 (×4): 10 mL
  Administered 2018-11-18: 20 mL
  Administered 2018-11-18: 10 mL
  Administered 2018-11-19: 20 mL
  Administered 2018-11-19: 10 mL
  Administered 2018-11-20: 20 mL
  Administered 2018-11-20: 10 mL
  Administered 2018-11-21: 20 mL
  Administered 2018-11-21 – 2018-11-30 (×17): 10 mL
  Administered 2018-11-30 – 2018-12-01 (×2): 20 mL
  Administered 2018-12-02: 10 mL
  Administered 2018-12-02: 20 mL

## 2018-11-15 MED ORDER — FUROSEMIDE 10 MG/ML IJ SOLN
40.0000 mg | Freq: Four times a day (QID) | INTRAMUSCULAR | Status: AC
  Administered 2018-11-15: 40 mg via INTRAVENOUS
  Filled 2018-11-15: qty 4

## 2018-11-15 MED ORDER — SODIUM CHLORIDE 0.9% FLUSH: 10.0000 mL | INTRAVENOUS | Status: DC | PRN

## 2018-11-15 MED ORDER — ORAL CARE MOUTH RINSE
15.0000 mL | Freq: Two times a day (BID) | OROMUCOSAL | Status: DC
  Administered 2018-11-15 – 2018-11-30 (×23): 15 mL via OROMUCOSAL

## 2018-11-15 NOTE — Progress Notes (Signed)
ANTICOAGULATION CONSULT NOTE - Initial Consult  Pharmacy Consult for Heparin Indication: DVT  Allergies  Allergen Reactions  . Sulfa Antibiotics Rash    Patient Measurements: Height: '5\' 5"'  (165.1 cm) Weight: 161 lb 13.1 oz (73.4 kg)(bed wt) IBW/kg (Calculated) : 57 Heparin Dosing Weight:   Vital Signs: Temp: 98.4 F (36.9 C) (01/27 1200) Temp Source: Esophageal (01/27 1200) BP: 107/56 (01/27 1500) Pulse Rate: 46 (01/27 1500)  Labs: Recent Labs    11/12/18 1630  11/12/18 1900  11/13/18 0136 11/13/18 0150 11/13/18 0453 11/13/18 0557 11/13/18 0617 11/13/18 0622  11/14/18 0400 11/14/18 0606 11/14/18 0844 11/15/18 0559  HGB  --    < >  --   --   --  9.1* 9.5*  --   --   --   --  9.4*  --   --  8.7*  HCT  --    < >  --   --   --  29.3* 28.0*  --   --   --   --  31.2*  --   --  29.0*  PLT  --   --   --   --   --  262  --   --   --   --   --  226  --   --  201  APTT 29  --   --   --   --   --   --   --   --  40*  --   --   --   --   --   LABPROT 14.6  --   --   --   --   --   --   --  14.8  --   --   --   --   --   --   INR 1.15  --   --   --   --   --   --   --  1.17  --   --   --   --   --   --   CREATININE 1.43*  --   --    < >  --  1.13*  --  1.09*  --   --    < > 1.19* 1.29* 1.34* 1.66*  TROPONINI 0.65*  --  0.80*  --  0.92*  --   --  0.94*  --   --   --   --   --   --   --    < > = values in this interval not displayed.    Estimated Creatinine Clearance: 31.2 mL/min (A) (by C-G formula based on SCr of 1.66 mg/dL (H)).   Medical History: Past Medical History:  Diagnosis Date  . ACS (acute coronary syndrome) ([place]) 06/2017  . Anemia    "off and on all my life" (12/22/2016)  . Anxiety   . Bilateral renal artery stenosis ([place]) 1999   s/p stenting  . Chronic kidney disease (CKD), stage III (moderate) ([place])    Archie Endo 12/22/2016  . Coronary artery disease 1999  . Depression   . Diverticulosis   . Duodenitis   . DVT (deep venous thrombosis) ([facility])    on Coumadin   . Dyspnea   . Gastric erosions   . Gastric ulcer   . GERD (gastroesophageal reflux disease)   . Heart murmur   . History of blood transfusion ~ 03/2016   "low HgB; practically nonexistent"  . Hyperlipidemia   .  Hypertension   . MI (myocardial infarction) ([place]) 1999  . Multiple myeloma ([place]) dx'd ~ 04/2016  . PAT (paroxysmal atrial tachycardia) ([place]) 12/22/2016  . Retinal hemorrhage, right eye   . Schatzki's ring   . Tachy-brady syndrome ([place])    Archie Endo 12/22/2016  . Tubular adenoma of colon     Medications:  Infusions:  . sodium chloride 60 mL/hr at 11/15/18 1400  . amiodarone 15 mg/hr (11/15/18 1400)  . feeding supplement (VITAL HIGH PROTEIN)    . fentaNYL infusion INTRAVENOUS Stopped (11/15/18 1345)  . heparin 950 Units/hr (11/15/18 1459)  . norepinephrine (LEVOPHED) Adult infusion Stopped (11/15/18 0742)  . potassium chloride 10 mEq (11/15/18 1453)    Assessment: 73 yo female admitted s/p cardiac arrest, now with new DVT found on dopplers.  Hx afib in the past, not on anticoagulation PTA due to hx GIB.  Pharmacy asked to begin anticoagulation with IV Heparin.  Goal of Therapy:  Heparin level 0.3-0.7 units/ml Monitor platelets by anticoagulation protocol: Yes   Plan:  1. Start IV heparin at rate of 950 units/hr.  No bolus given recent GIB. 2. Check heparin level in 8 hrs 3. Daily heparin level and CBC.  Marguerite Olea, [facility] Clinical Pharmacist Phone [phone]  11/15/2018 3:45 PM

## 2018-11-15 NOTE — Progress Notes (Signed)
[facility] Progress Note Patient Name: Abigail [name] DOB: 05/30/1946 MRN: [id number]   Date of Service  11/15/2018  HPI/Events of Note  Request for cathflo for distal port of R IJ CVL.  eICU Interventions  Will order: 1. Cathflo PRN R IJ CVL distal port.      Intervention Category Major Interventions: Other:  Sommer,Steven Cornelia Copa 11/15/2018, 1:56 AM

## 2018-11-15 NOTE — Progress Notes (Signed)
Nutrition Follow-up  DOCUMENTATION CODES:   Non-severe (moderate) malnutrition in context of chronic illness  INTERVENTION:   Tube Feeding:  Vital High Protein @ 60 ml/hr Provides 1440 kcals, 127 g of protein and 1210 mL of free water Meets 100% estimated calorie, protein needs  NUTRITION DIAGNOSIS:   Moderate Malnutrition related to chronic illness as evidenced by moderate muscle depletion, moderate fat depletion.  Being addressed via TF   GOAL:   Patient will meet greater than or equal to 90% of their needs  progressing  MONITOR:   PO intake, Supplement acceptance, Diet advancement, TF tolerance  REASON FOR ASSESSMENT:   Ventilator    ASSESSMENT:   73 y/o female PMHx Multiple Myeloma, currently on chemotherapy, ckd3, FTT, Afib, CAD. Had recently been admitted 1/3-1/14 for infectious colitis. Now presents via EMS after suffering 2x cardiac arrest, first w/ ROSC 2 min and second ROSC 10 min. Intubated in field and begun on hypothermia protocol on arrival  Patient is currently intubated on ventilator support MV: 8.6 L/min Temp (24hrs), Avg:98.6 F (37 C), Min:98.1 F (36.7 C), Max:99 F (37.2 C)  OG tube in place, Off propfol  No new weight since 1/25  Labs: potassium 3.2, Creatinine 1.66 Meds:  NS at 60 ml/hr, lasix   Diet Order:   Diet Order    None      EDUCATION NEEDS:   No education needs have been identified at this time  Skin:  Skin Assessment: Reviewed RN Assessment  Last BM:  Unknown  Height:   Ht Readings from Last 1 Encounters:  11/12/18 _0  (1.651 m)    Weight:   Wt Readings from Last 1 Encounters:  11/13/18 73.4 kg    Ideal Body Weight:  56.82 kg  BMI:  Body mass index is 26.93 kg/m.  Estimated Nutritional Needs:   Kcal:  2003 (PSU 2003 b)  Protein:  110-130g Pro (1.6-1.9g/kg bw)  Fluid:  Per MD management   Kerman Passey MS, RD, LDN, CNSC [phone] Pager  [phone] Weekend/On-Call Pager

## 2018-11-15 NOTE — Procedures (Signed)
Extubation Procedure Note  Patient Details:   Name: Abigail [name] DOB: 1945/11/24 MRN: [id number]   Airway Documentation:    Vent end date: 11/15/18 Vent end time: 1411   Evaluation  O2 sats: stable throughout Complications: No apparent complications Patient did tolerate procedure well. Bilateral Breath Sounds: Clear, Diminished   No   After patient was extubated, pt nods her head when asked a question and did not speak. Pt had a positive cuff leak prior to extubation and no stridor noted after.  Abigail [name] [name] 11/15/2018, 2:28 PM

## 2018-11-15 NOTE — Progress Notes (Signed)
Cardiology MD at bedside to assess patient; made aware of pt's HR fluctuating between the 30s and 140s. Pt asymptomatic at this time, BP stable. Amiodarone currently going at 15 mg/hr, orders to increase to 20 mg/hr. Will continue to closely monitor.  Sherlie Ban, RN

## 2018-11-15 NOTE — Progress Notes (Addendum)
NAME:  Abigail [name], MRN:  [id number], DOB:  03-10-1946, LOS: 3 ADMISSION DATE:  11/12/2018 REFERRING MD:  Ashok Cordia, CHIEF COMPLAINT:  Cardiac arrest  Brief History   73 year old woman who was admit 1/23 after VT/VF arrest with overall time to ROSC estimated at over 12 minutes. Hypothermia protocol was initiated. The arrest happened while she was on the phone and suddenly stopped talking, began to look off but appeared to be breathing. EMS was called and on arrival she was found to be in a shockable rhythm. She was defibrillated three times and CPR was initiated, they obtained ROSC in about 2 minutes. She was intubated by EMS. She suffered cardiac arrest prior to arrival and had ROSC in about 10 minutes. On arrival to the ED she was posturing with a right sided gaze preference. She was made DNR on admission.   Past Medical History  Multiple myeloma currently on chemotherapy  Failure to thrive  Recently hospitalized for infectious colitis and septic shock requiring vasopressors discharged 1/14  Myasthenia gravis  Atrial fibrillation  CKD III  Moderate mitral valve regurgitation and tricuspid valve regurgitation  Anemia of chronic disease with hx of GI bleed    Significant Hospital Events   1/24 admit post VT/VF arrest, shocked 3x and intubated in the field with approximately 12 minutes of cardiac arrest prior to achieving ROSC.   Consults:  Cardiology  CCM   Procedures:  ETT 1/24 >  R IJ CVC 1/24 >   Significant Diagnostic Tests:  CT brain 1/24 no acute intracranial abnormalities, age related atrophy, old small vessel infarct of the left frontal lobe, lytic lesions throughout the calvarium   TTE 1/25 akinesia of the apical myocardium, hypokinesia of the anterior lateral and inferior myocardium, LVEF 20-25%, dilated LA, moderately dilated RV   Micro Data:  Respiratory culture 1/24>>> MRSA -   Antimicrobials:    Interim history/subjective:   Levophed weaned off   Objective     Blood pressure (!) 117/54, pulse (!) 48, temperature 98.8 F (37.1 C), temperature source Esophageal, resp. rate 20, height '5\' 5"'  (1.651 m), weight 73.4 kg, SpO2 100 %. CVP:  [8 mmHg-75 mmHg] 8 mmHg  Vent Mode: CPAP;PSV FiO2 (%):  [30 %] 30 % Set Rate:  [12 bmp] 12 bmp Vt Set:  [400 mL] 400 mL PEEP:  [5 cmH20] 5 cmH20 Pressure Support:  [5 cmH20] 5 cmH20 Plateau Pressure:  [9 cmH20-12 cmH20] 11 cmH20   Intake/Output Summary (Last 24 hours) at 11/15/2018 1443 Last data filed at 11/15/2018 0800 Gross per 24 hour  Intake 2335.96 ml  Output 500 ml  Net 1835.96 ml   Filed Weights   11/12/18 1206 11/13/18 1936  Weight: 74 kg 73.4 kg    Examination: General: ill appearing  HENT: ETT, OGT  Lungs: clear to auscultation  Cardiovascular: slow rate, regular rhythm, no murmur  Abdomen: soft, non tender  Extremities: proximal muscle strength is demonstrated but weak, right lower extremity with 1+ edema and tender to palpation, cooling devices over legs   Neuro: awake, alert, following commands  GU: foley in place with urine output collecting in the bag   Resolved Hospital Problem list   Lactic acidosis with resultant anion gap metabolic acidosis   Assessment & Plan:   VT/ VF cardiac arrest  S/p 12 minutes of resuscitation with 3x shock, cooling was initiated on arrival. Troponin peaked at 0/94.  - ? transition amiodarone to po  - await cardiology recommendations regarding cardiac  cath   Hypokalemia  - K 3.2  - replace with Iv 10 meq x 4   Hypocalcemia  - Replace low mag  - follow up Phos after repletion yesterday, will replace if it remains low   - follow up Ca with next lab draw   Acute respiratory failure post cardiac arrest  CXR today consistent with a new L pleural effusion  - doing well on SBT this morning, off of all sedation   Acute metabolic encephalopathy, with concern for an anoxic brain injury  Clinical exam seems improved today compared with yesterdays  evaluations.   History of atrial fibrillation  Coronary artery disease  Mitral valve regurgitation  HTN  Amiodarone  Tele monitoring  - ? Resume home atorvastatin   - continue to hold home diltiazem, hydralazine   Acute on chronic renal failure post cardiac arrest  CKD Stage III  8.8 L positive this admission, creatinine up today, new pleural effusion and edema on CXR this morning,  - ? Stop IV fluids  - ?Give IV lasix 40 mg x1   Elevated Transaminase  Likely shock liver  - follow up hepatic function   Multiple Myeloma with diffuse lytic bone lesions  Most recent zometa infusion 10/08/2017, chemotherapy currently on hold.   History of myasthenia gravis  - on home pyridostigmine   Best practice:  Diet: NPO  Pain/Anxiety/Delirium protocol (if indicated): propofol on hold  VAP protocol (if indicated): Yes DVT prophylaxis: subq heparin GI prophylaxis: Iv protonix 40 mg qhs  Glucose control: CBG monitoring with SSI  Mobility: bedrest Code Status: DNR Family Communication: will reach out to family today  Disposition: may attempt extubation today, continue to monitor in ICU   Labs   CBC: Recent Labs  Lab 11/12/18 1200 11/12/18 1741 11/13/18 0150 11/13/18 0453 11/14/18 0400 11/15/18 0559  WBC 12.3*  --  15.5*  --  16.5* 16.0*  NEUTROABS  --   --   --   --  13.5*  --   HGB 10.3* 10.9* 9.1* 9.5* 9.4* 8.7*  HCT 36.1 32.0* 29.3* 28.0* 31.2* 29.0*  MCV 102.6*  --  95.1  --  100.3* 100.0  PLT 288  --  262  --  226 267    Basic Metabolic Panel: Recent Labs  Lab 11/12/18 1630  11/13/18 0150  11/14/18 0204 11/14/18 0400 11/14/18 0606 11/14/18 0844 11/15/18 0559  NA 140   < > 142   < > 144 144 144 144 145  K 2.8*   < > 3.9   < > 3.6 3.8 3.8 3.7 3.2*  CL 104   < > 112*   < > 118* 118* 117* 118* 121*  CO2 15*   < > 18*   < > 20* 17* 16* 16* 15*  GLUCOSE 289*   < > 93   < > 116* 112* 103* 109* 104*  BUN 10   < > 8   < > 7* 5* <5* 5* 11  CREATININE 1.43*   < > 1.13*    < > 1.15* 1.19* 1.29* 1.34* 1.66*  CALCIUM 8.2*   < > 7.6*   < > 6.7* 6.8* 6.7* 6.7* 7.0*  MG 1.2*  --  1.0*  --   --   --   --   --  1.7  PHOS  --   --  <1.0*  --   --   --   --   --   --    < > =  values in this interval not displayed.   GFR: Estimated Creatinine Clearance: 31.2 mL/min (A) (by C-G formula based on SCr of 1.66 mg/dL (H)). Recent Labs  Lab 11/12/18 1200 11/13/18 0150 11/14/18 0400 11/15/18 0559  WBC 12.3* 15.5* 16.5* 16.0*    Liver Function Tests: Recent Labs  Lab 11/12/18 1200 11/12/18 1905 11/14/18 0400  AST 140* 150* 50*  ALT 68* 66* 38  ALKPHOS 68 66 66  BILITOT 1.5* 1.3* 0.9  PROT 7.1 6.6 5.2*  ALBUMIN 3.6 3.2* 2.4*   No results for input(s): LIPASE, AMYLASE in the last 168 hours. No results for input(s): AMMONIA in the last 168 hours.  ABG    Component Value Date/Time   PHART 7.543 (H) 11/13/2018 0453   PCO2ART 20.9 (L) 11/13/2018 0453   PO2ART 172.0 (H) 11/13/2018 0453   HCO3 18.6 (L) 11/13/2018 0453   TCO2 19 (L) 11/13/2018 0453   ACIDBASEDEF 4.0 (H) 11/13/2018 0453   O2SAT 100.0 11/13/2018 0453     Coagulation Profile: Recent Labs  Lab 11/12/18 1630 11/13/18 0617  INR 1.15 1.17    Cardiac Enzymes: Recent Labs  Lab 11/12/18 1630 11/12/18 1900 11/13/18 0136 11/13/18 0557  TROPONINI 0.65* 0.80* 0.92* 0.94*    HbA1C: Hgb A1c MFr Bld  Date/Time Value Ref Range Status  12/31/2011 06:41 AM 5.7 (H) <5.7 % Final    Comment:    (NOTE)                                                                       According to the ADA Clinical Practice Recommendations for 2011, when HbA1c is used as a screening test:  >=6.5%   Diagnostic of Diabetes Mellitus           (if abnormal result is confirmed) 5.7-6.4%   Increased risk of developing Diabetes Mellitus References:Diagnosis and Classification of Diabetes Mellitus,Diabetes UUEK,8003,49(ZPHXT 1):S62-S69 and Standards of Medical Care in         Diabetes - 2011,Diabetes AVWP,7948,01  (Suppl 1):S11-S61.    CBG: Recent Labs  Lab 11/14/18 1605 11/14/18 2006 11/14/18 2337 11/15/18 0311 11/15/18 0748  GLUCAP 97 105* 93 89 90    Review of Systems:     Past Medical History  She,  has a past medical history of ACS (acute coronary syndrome) ([place]) (06/2017), Anemia, Anxiety, Bilateral renal artery stenosis (HCC) (1999), Chronic kidney disease (CKD), stage III (moderate) ([place]), Coronary artery disease (1999), Depression, Diverticulosis, Duodenitis, DVT (deep venous thrombosis) (HCC), Dyspnea, Gastric erosions, Gastric ulcer, GERD (gastroesophageal reflux disease), Heart murmur, History of blood transfusion (~ 03/2016), Hyperlipidemia, Hypertension, MI (myocardial infarction) ([place]) (1999), Multiple myeloma ([place]) (dx'd ~ 04/2016), PAT (paroxysmal atrial tachycardia) ([place]) (12/22/2016), Retinal hemorrhage, right eye, Schatzki's ring, Tachy-brady syndrome ([place]), and Tubular adenoma of colon.   Surgical History    Past Surgical History:  Procedure Laterality Date  . APPENDECTOMY    . BACK SURGERY    . CATARACT EXTRACTION W/ INTRAOCULAR LENS IMPLANT Right 2014  . COLONOSCOPY WITH PROPOFOL N/A 09/04/2017   Procedure: COLONOSCOPY WITH PROPOFOL;  Surgeon: Doran Stabler, MD;  Location: [facility];  Service: Gastroenterology;  Laterality: N/A;  . CORONARY ANGIOGRAPHY N/A 01/23/2017   Procedure: Coronary Angiography;  Surgeon: [name], MD;  Location: [facility];  Service: Cardiovascular;  Laterality: N/A;  . CORONARY ANGIOPLASTY WITH STENT PLACEMENT  1999  . CORONARY STENT INTERVENTION N/A 12/29/2016   Procedure: Coronary Stent Intervention;  Surgeon: Charolette Forward, MD;  Location: [facility];  Service: Cardiovascular;  Laterality: N/A;  . CORONARY STENT INTERVENTION N/A 02/24/2017   Procedure: Coronary Stent Intervention;  Surgeon: Charolette Forward, MD;  Location: [facility];  Service: Cardiovascular;  Laterality: N/A;  . ESOPHAGOGASTRODUODENOSCOPY N/A  11/03/2015   Procedure: ESOPHAGOGASTRODUODENOSCOPY (EGD);  Surgeon: Carol Ada, MD;  Location: [facility] ENDOSCOPY;  Service: Endoscopy;  Laterality: N/A;  . ESOPHAGOGASTRODUODENOSCOPY N/A 09/03/2017   Procedure: ESOPHAGOGASTRODUODENOSCOPY (EGD);  Surgeon: Doran Stabler, MD;  Location: [facility];  Service: Gastroenterology;  Laterality: N/A;  . ESOPHAGOGASTRODUODENOSCOPY (EGD) WITH PROPOFOL N/A 11/18/2016   Procedure: ESOPHAGOGASTRODUODENOSCOPY (EGD) WITH PROPOFOL;  Surgeon: Ladene Artist, MD;  Location: [facility];  Service: Endoscopy;  Laterality: N/A;  . LEFT HEART CATH AND CORONARY ANGIOGRAPHY N/A 12/24/2016   Procedure: Left Heart Cath and Coronary Angiography;  Surgeon: [name], MD;  Location: [facility] [facility];  Service: Cardiovascular;  Laterality: N/A;  . LEFT HEART CATH AND CORONARY ANGIOGRAPHY N/A 02/24/2017   Procedure: Left Heart Cath and Coronary Angiography;  Surgeon: Charolette Forward, MD;  Location: [place] [facility];  Service: Cardiovascular;  Laterality: N/A;  . LEFT HEART CATH AND CORONARY ANGIOGRAPHY N/A 03/05/2017   Procedure: Left Heart Cath and Coronary Angiography;  Surgeon: [name], MD;  Location: [facility];  Service: Cardiovascular;  Laterality: N/A;  . LEFT HEART CATH AND CORONARY ANGIOGRAPHY N/A 09/02/2018   Procedure: LEFT HEART CATH AND CORONARY ANGIOGRAPHY;  Surgeon: [name], MD;  Location: [facility];  Service: Cardiovascular;  Laterality: N/A;  . LUMBAR [place]    . RENAL ARTERY STENT  1999   bil RAS so ? bil vs unilateral stents  . RETINAL LASER PROCEDURE Right 2012   "bleeding"  . TONSILLECTOMY    . TOTAL ABDOMINAL HYSTERECTOMY       Social History   reports that she has been smoking cigarettes. She has a 25.00 pack-year smoking history. She has never used smokeless tobacco. She reports that she does not drink alcohol or use drugs.   Family History   Her family history includes Breast cancer in her maternal aunt,  mother, and sister. There is no history of Colon cancer.   Allergies Allergies  Allergen Reactions  . Sulfa Antibiotics Rash     Home Medications  Prior to Admission medications   Medication Sig Start Date End Date Taking? Authorizing Provider  acyclovir (ZOVIRAX) 400 MG tablet TAKE 1 TABLET (400 MG TOTAL) BY MOUTH 2 (TWO) TIMES DAILY. 08/29/16  Yes Ladell Pier, MD  amiodarone (PACERONE) 200 MG tablet Take 0.5 tablets (100 mg total) by mouth daily. 10/19/18  Yes [name], MD  atorvastatin (LIPITOR) 80 MG tablet Take 0.5 tablets (40 mg total) by mouth daily at 6 PM. 10/03/18  Yes Charolette Forward, MD  calcium carbonate (OS-CAL - DOSED IN MG OF ELEMENTAL CALCIUM) 1250 (500 Ca) MG tablet Take 1 tablet by mouth daily with breakfast.   Yes [provider]  cyclophosphamide (CYTOXAN) 50 MG capsule Take 10 capsules (514m) by mouth once weekly for 3 weeks on, 1 week off, repeat every 4 weeks. Take early in the day. Maintain hydration. 10/28/18  Yes SLadell Pier MD  dexamethasone (DECADRON) 4 MG tablet Take  5 tablets (41m) weekly day of chemotherapy 10/21/18  Yes SLadell Pier MD  diltiazem (CARDIZEM) 30 MG tablet Take 1 tablet (30 mg total) by mouth every 12 (twelve) hours. 10/19/18  Yes KDixie Dials MD  ferrous sulfate 325 (65 FE) MG tablet Take 1 tablet (325 mg total) by mouth daily with breakfast. 03/02/17  Yes KDixie Dials MD  hydrALAZINE (APRESOLINE) 25 MG tablet Take 25 mg by mouth 3 (three) times daily. 10/10/18  Yes [provider]  nitroGLYCERIN (NITROSTAT) 0.4 MG SL tablet Place 0.4 mg under the tongue every 5 (five) minutes as needed for chest pain. 03/20/17  Yes [provider]  oxyCODONE-acetaminophen (PERCOCET/ROXICET) 5-325 MG tablet Take 1 tablet by mouth every 4 (four) hours as needed for moderate pain or severe pain. 11/02/18  Yes TEugenie Filler MD  pantoprazole (PROTONIX) 40 MG tablet Take 1 tablet (40 mg total) by mouth 2 (two) times  daily before a meal. 09/17/18  Yes KDixie Dials MD  Potassium Chloride ER (K-TAB) 20 MEQ TBCR Take 20 mEq by mouth daily. 10/21/18  Yes SLadell Pier MD  pyridostigmine (MESTINON) 60 MG tablet Take 0.5 tablets (30 mg total) by mouth 2 (two) times daily. 01/25/17  Yes KDixie Dials MD  tiZANidine (ZANAFLEX) 4 MG tablet Take 4 mg by mouth at bedtime as needed for muscle spasms.  04/06/18  Yes [provider]  feeding supplement, ENSURE ENLIVE, (ENSURE ENLIVE) LIQD Take 237 mLs by mouth 2 (two) times daily between meals. Patient taking differently: Take 237 mLs by mouth See admin instructions. Drink one bottle (237 ml) by mouth one or two times daily as a meal supplement 09/17/18   KDixie Dials MD  ISOSORBIDE DINITRATE PO Take by mouth.  12/30/11  [provider]    Attending Note:  73year old female s/p PEA arrest that underwent the hypothermia protocol who is now alert and following command.  No events overnight.  On exam, patient is alert and interactive, moving all ext to command with clear lungs.  I reviewed CXR myself, ETT is in a good position and left sided pleural effusion noted.  Discussed with resident.  Will begin weaning efforts.  Continue solumedrol for upper airway edema.  Hold off extubation today.  Start TF.  Lasix 40 mg IV q8 x2 doses.  Replace K, Mg and Phos.  BMET in AM.  PCCM will continue to follow.  The patient is critically ill with multiple organ systems failure and requires high complexity decision making for assessment and support, frequent evaluation and titration of therapies, application of advanced monitoring technologies and extensive interpretation of multiple databases.   Critical Care Time devoted to patient care services described in this note is  32  Minutes. This time reflects time of care of this signee Dr WJennet Maduro This critical care time does not reflect procedure time, or teaching time or supervisory time of PA/NP/Med student/Med Resident  etc but could involve care discussion time.  WRush Farmer M.D. LSt Elizabeth Boardman Health CenterPulmonary/Critical Care Medicine. Pager: [phone] After hours pager: [phone]

## 2018-11-15 NOTE — Consult Note (Signed)
Ref: Abigail Ebbs, MD   Subjective:  Awake. Following commands. Failed attempt to wean off respirator yesterday. Off sedation for 1-2 hours. Possible endotracheal tube area swelling per respiratory tech as checked with release of endotracheal tube cuff pressure.  Monitor shows sinus bradycardia. O2 sat 100 % on 30 % oxygen. Mild hypokalemia. Chest x-ray: possible aspiration pneumonia and increasing vascular congestion.  Objective:  Vital Signs in the last 24 hours: Temp:  [98.1 F (36.7 C)-99 F (37.2 C)] 98.6 F (37 C) (01/27 0800) Pulse Rate:  [48-77] 49 (01/27 1000) Cardiac Rhythm: Sinus bradycardia;Junctional rhythm (01/27 0800) Resp:  [10-25] 14 (01/27 1000) BP: (91-148)/(49-75) 118/58 (01/27 1000) SpO2:  [99 %-100 %] 100 % (01/27 1000) FiO2 (%):  [30 %] 30 % (01/27 0940)  Physical Exam: BP Readings from Last 1 Encounters:  11/15/18 (!) 118/58     Wt Readings from Last 1 Encounters:  11/13/18 73.4 kg    Weight change:  Body mass index is 26.93 kg/m. HEENT: [place]/AT, Eyes-Brown, Conjunctiva-Pale, Sclera-Non-icteric. Intubated. Neck: No JVD, No bruit, Trachea midline. Lungs:  Clearing, Bilateral. Cardiac:  Regular rhythm, normal S1 and S2, no S3. II/VI systolic murmur. Abdomen:  Soft, non-tender. BS present. Extremities:  1 + edema of right leg more than left present. No cyanosis. No clubbing. CNS: AxOx3, Cranial nerves grossly intact, moves all 4 extremities.  Skin: Warm and dry.   Intake/Output from previous day: 01/26 0701 - 01/27 0700 In: 2658 [I.V.:2658] Out: 525 [Urine:325; Emesis/NG output:200]    Lab Results: BMET    Component Value Date/Time   NA 145 11/15/2018 0559   NA 144 11/14/2018 0844   NA 144 11/14/2018 0606   NA 144 09/17/2017 1429   NA 141 07/30/2017 1504   NA 142 06/11/2017 1451   K 3.2 (L) 11/15/2018 0559   K 3.7 11/14/2018 0844   K 3.8 11/14/2018 0606   K 3.1 (L) 09/17/2017 1429   K 4.8 07/30/2017 1504   K 4.3 06/11/2017 1451   CL  121 (H) 11/15/2018 0559   CL 118 (H) 11/14/2018 0844   CL 117 (H) 11/14/2018 0606   CO2 15 (L) 11/15/2018 0559   CO2 16 (L) 11/14/2018 0844   CO2 16 (L) 11/14/2018 0606   CO2 23 09/17/2017 1429   CO2 20 (L) 07/30/2017 1504   CO2 20 (L) 06/11/2017 1451   GLUCOSE 104 (H) 11/15/2018 0559   GLUCOSE 109 (H) 11/14/2018 0844   GLUCOSE 103 (H) 11/14/2018 0606   GLUCOSE 96 09/17/2017 1429   GLUCOSE 79 07/30/2017 1504   GLUCOSE 74 06/11/2017 1451   BUN 11 11/15/2018 0559   BUN 5 (L) 11/14/2018 0844   BUN <5 (L) 11/14/2018 0606   BUN 15.6 09/17/2017 1429   BUN 24.4 07/30/2017 1504   BUN 18.7 06/11/2017 1451   CREATININE 1.66 (H) 11/15/2018 0559   CREATININE 1.34 (H) 11/14/2018 0844   CREATININE 1.29 (H) 11/14/2018 0606   CREATININE 1.97 (H) 10/21/2018 1055   CREATININE 1.36 (H) 10/07/2018 0823   CREATININE 1.41 (H) 09/30/2018 1005   CREATININE 1.0 09/17/2017 1429   CREATININE 1.2 (H) 07/30/2017 1504   CREATININE 1.2 (H) 06/11/2017 1451   CALCIUM 7.0 (L) 11/15/2018 0559   CALCIUM 6.7 (L) 11/14/2018 0844   CALCIUM 6.7 (L) 11/14/2018 0606   CALCIUM 8.6 09/17/2017 1429   CALCIUM 9.1 07/30/2017 1504   CALCIUM 9.6 06/11/2017 1451   GFRNONAA 31 (L) 11/15/2018 0559   GFRNONAA 40 (L) 11/14/2018 1855  GFRNONAA 42 (L) 11/14/2018 0606   GFRNONAA 25 (L) 10/21/2018 1055   GFRNONAA 39 (L) 10/07/2018 0823   GFRNONAA 37 (L) 09/30/2018 1005   GFRAA 36 (L) 11/15/2018 0559   GFRAA 46 (L) 11/14/2018 0844   GFRAA 48 (L) 11/14/2018 0606   GFRAA 29 (L) 10/21/2018 1055   GFRAA 45 (L) 10/07/2018 0823   GFRAA 43 (L) 09/30/2018 1005   CBC    Component Value Date/Time   WBC 16.0 (H) 11/15/2018 0559   RBC 2.90 (L) 11/15/2018 0559   HGB 8.7 (L) 11/15/2018 0559   HGB 9.1 (L) 10/21/2018 1055   HGB 11.3 (L) 09/17/2017 1428   HCT 29.0 (L) 11/15/2018 0559   HCT 35.8 09/17/2017 1428   PLT 201 11/15/2018 0559   PLT 271 10/21/2018 1055   PLT 231 09/17/2017 1428   MCV 100.0 11/15/2018 0559   MCV 93.2  09/17/2017 1428   MCH 30.0 11/15/2018 0559   MCHC 30.0 11/15/2018 0559   RDW 20.8 (H) 11/15/2018 0559   RDW 16.5 (H) 09/17/2017 1428   LYMPHSABS 0.6 (L) 11/14/2018 0400   LYMPHSABS 0.7 (L) 09/17/2017 1428   MONOABS 1.8 (H) 11/14/2018 0400   MONOABS 0.5 09/17/2017 1428   EOSABS 0.0 11/14/2018 0400   EOSABS 0.1 09/17/2017 1428   BASOSABS 0.1 11/14/2018 0400   BASOSABS 0.0 09/17/2017 1428   HEPATIC Function Panel Recent Labs    11/12/18 1200 11/12/18 1905 11/14/18 0400  PROT 7.1 6.6 5.2*   HEMOGLOBIN A1C No components found for: HGA1C,  MPG CARDIAC ENZYMES Lab Results  Component Value Date   CKTOTAL 52 08/21/2017   CKMB 1.3 08/21/2017   TROPONINI 0.94 (HH) 11/13/2018   TROPONINI 0.92 (HH) 11/13/2018   TROPONINI 0.80 (HH) 11/12/2018   BNP No results for input(s): PROBNP in the last 8760 hours. TSH Recent Labs    12/13/17 2243 10/02/18 1825  TSH 1.922 0.751   CHOLESTEROL Recent Labs    12/14/17 0457 09/02/18 0152 10/03/18 0637  CHOL 167 143 136    Scheduled Meds: . chlorhexidine gluconate (MEDLINE KIT)  15 mL Mouth Rinse BID  . fentaNYL (SUBLIMAZE) injection  50 mcg Intravenous Once  . heparin  5,000 Units Subcutaneous Q8H  . insulin aspart  0-9 Units Subcutaneous Q4H  . mouth rinse  15 mL Mouth Rinse 10 times per day  . methylPREDNISolone (SOLU-MEDROL) injection  125 mg Intravenous Once  . pantoprazole (PROTONIX) IV  40 mg Intravenous QHS  . pyridostigmine  30 mg Per Tube BID   Continuous Infusions: . sodium chloride Stopped (11/15/18 0949)  . amiodarone 15 mg/hr (11/15/18 1000)  . cisatracurium (NIMBEX) infusion Stopped (11/14/18 0428)  . fentaNYL infusion INTRAVENOUS Stopped (11/15/18 0817)  . magnesium sulfate 1 - 4 g bolus IVPB 2 g (11/15/18 1004)  . norepinephrine (LEVOPHED) Adult infusion Stopped (11/15/18 0742)  . potassium chloride 100 mL/hr at 11/15/18 1000  . propofol (DIPRIVAN) infusion Stopped (11/14/18 1205)   PRN Meds:.[COMPLETED]  cisatracurium **AND** cisatracurium (NIMBEX) infusion **AND** cisatracurium, fentaNYL, hydrALAZINE  Assessment/Plan: V. Fib cardiac arrest Acute respiratory failure with hypoxemia Possible aspiration pneumonia Acute systolic left heart failure Hypoxic encephalopathy, improving Multiple myeloma CAD LAD and LCx stents Ischemic cardiomyopathy Anemia of chronic disease Acute renal failure  Solumedrol for endotracheal edema Decrease amiodarone dose Add small dose lanoxin or IV dobutamine after potassium supplement Potassium supplement Antibiotics per medical team.   LOS: 3 days    [name]  MD  11/15/2018, 10:14 AM

## 2018-11-15 NOTE — Plan of Care (Signed)
  Problem: Clinical Measurements: Goal: Ability to maintain clinical measurements within normal limits will improve Outcome: Progressing Goal: Will remain free from infection Outcome: Progressing Goal: Diagnostic test results will improve Outcome: Progressing Goal: Respiratory complications will improve Outcome: Progressing Goal: Cardiovascular complication will be avoided Outcome: Progressing   Problem: Safety: Goal: Ability to remain free from injury will improve Outcome: Progressing   Problem: Skin Integrity: Goal: Risk for impaired skin integrity will decrease Outcome: Progressing   Problem: Cardiac: Goal: Ability to achieve and maintain adequate cardiopulmonary perfusion will improve Outcome: Progressing   Problem: Neurologic: Goal: Promote progressive neurologic recovery Outcome: Progressing   Problem: Activity: Goal: Risk for activity intolerance will decrease Outcome: Not Progressing Note:  Pt still intubated

## 2018-11-15 NOTE — Progress Notes (Signed)
Messaged MD Nelda Marseille to obtain an order to TPA the brown port of then central line. The port has been TPA'd twice. I attempted two PIVs and was unsuccessful. At this time the patient still has two ports working. All three ports were flushed with caps changed. Two ports allowed blood return and the brown one did not. The CVC was placed 11/12/2018. Dressing intact and CVC does not look or seem to be kinked.

## 2018-11-15 NOTE — Progress Notes (Signed)
PT Cancellation Note  Patient Details Name: Abigail [name] MRN: [id number] DOB: 05/14/1946   Cancelled Treatment:    Reason Eval/Treat Not Completed: Medical issues which prohibited therapy(+ DVT, will await therapeutic levels prior to mobility)   Duncan Dull 11/15/2018, 3:16 PM

## 2018-11-15 NOTE — Progress Notes (Signed)
SLP Cancellation Note  Patient Details Name: Abigail [name] MRN: [id number] DOB: October 22, 1945   Cancelled treatment:        Intubated. Will follow   Houston Siren 11/15/2018, 2:24 PM  Orbie Pyo Colvin Caroli.Ed Risk analyst [phone] Office [phone]

## 2018-11-15 NOTE — Progress Notes (Signed)
VASCULAR LAB PRELIMINARY  PRELIMINARY  PRELIMINARY  PRELIMINARY  Right lower extremity venous duplex completed.    Preliminary report:  There is acute DVT noted from the proximal femoral vein through to the popliteal vein. Unable to visualize calf veins.  Full preliminary report to follow in CV Proc  Gave results to [facility], RN  Mauro Kaufmann, [facility], RVT 11/15/2018, 12:54 PM

## 2018-11-16 ENCOUNTER — Inpatient Hospital Stay (HOSPITAL_COMMUNITY): Payer: Medicare HMO

## 2018-11-16 DIAGNOSIS — I471 Supraventricular tachycardia: Secondary | ICD-10-CM

## 2018-11-16 DIAGNOSIS — I441 Atrioventricular block, second degree: Secondary | ICD-10-CM

## 2018-11-16 DIAGNOSIS — J9611 Chronic respiratory failure with hypoxia: Secondary | ICD-10-CM

## 2018-11-16 DIAGNOSIS — J90 Pleural effusion, not elsewhere classified: Secondary | ICD-10-CM

## 2018-11-16 LAB — CBC
HCT: 27.6 % — ABNORMAL LOW (ref 36.0–46.0)
Hemoglobin: 8.4 g/dL — ABNORMAL LOW (ref 12.0–15.0)
MCH: 30.1 pg (ref 26.0–34.0)
MCHC: 30.4 g/dL (ref 30.0–36.0)
MCV: 98.9 fL (ref 80.0–100.0)
PLATELETS: 190 10*3/uL (ref 150–400)
RBC: 2.79 MIL/uL — ABNORMAL LOW (ref 3.87–5.11)
RDW: 20.7 % — ABNORMAL HIGH (ref 11.5–15.5)
WBC: 19.3 10*3/uL — AB (ref 4.0–10.5)
nRBC: 0.3 % — ABNORMAL HIGH (ref 0.0–0.2)

## 2018-11-16 LAB — BASIC METABOLIC PANEL
Anion gap: 12 (ref 5–15)
BUN: 12 mg/dL (ref 8–23)
CO2: 15 mmol/L — ABNORMAL LOW (ref 22–32)
Calcium: 8.2 mg/dL — ABNORMAL LOW (ref 8.9–10.3)
Chloride: 116 mmol/L — ABNORMAL HIGH (ref 98–111)
Creatinine, Ser: 1.61 mg/dL — ABNORMAL HIGH (ref 0.44–1.00)
GFR calc Af Amer: 37 mL/min — ABNORMAL LOW (ref >60)
GFR, EST NON AFRICAN AMERICAN: 32 mL/min — AB (ref >60)
Glucose, Bld: 116 mg/dL — ABNORMAL HIGH (ref 70–99)
Potassium: 4.1 mmol/L (ref 3.5–5.1)
SODIUM: 143 mmol/L (ref 135–145)

## 2018-11-16 LAB — PHOSPHORUS
Phosphorus: 4.5 mg/dL (ref 2.5–4.6)
Phosphorus: 4.7 mg/dL — ABNORMAL HIGH (ref 2.5–4.6)

## 2018-11-16 LAB — HEPARIN LEVEL (UNFRACTIONATED)
HEPARIN UNFRACTIONATED: 1.48 [IU]/mL — AB (ref 0.30–0.70)
Heparin Unfractionated: 1.18 IU/mL — ABNORMAL HIGH (ref 0.30–0.70)
Heparin Unfractionated: 1.09 IU/mL — ABNORMAL HIGH (ref 0.30–0.70)
Heparin Unfractionated: 0.42 IU/mL (ref 0.30–0.70)

## 2018-11-16 LAB — GLUCOSE, CAPILLARY
GLUCOSE-CAPILLARY: 116 mg/dL — AB (ref 70–99)
GLUCOSE-CAPILLARY: 105 mg/dL — AB (ref 70–99)
Glucose-Capillary: 103 mg/dL — ABNORMAL HIGH (ref 70–99)
Glucose-Capillary: 109 mg/dL — ABNORMAL HIGH (ref 70–99)
Glucose-Capillary: 111 mg/dL — ABNORMAL HIGH (ref 70–99)
Glucose-Capillary: 113 mg/dL — ABNORMAL HIGH (ref 70–99)
Glucose-Capillary: 95 mg/dL (ref 70–99)

## 2018-11-16 LAB — MAGNESIUM
Magnesium: 2 mg/dL (ref 1.7–2.4)
Magnesium: 1.8 mg/dL (ref 1.7–2.4)

## 2018-11-16 MED ORDER — PYRIDOSTIGMINE BROMIDE 10 MG/2ML IV SOLN
1.0000 mg | Freq: Two times a day (BID) | INTRAVENOUS | Status: DC
  Administered 2018-11-16 – 2018-11-17 (×3): 1 mg via INTRAMUSCULAR
  Filled 2018-11-16 (×3): qty 0.2

## 2018-11-16 MED ORDER — HEPARIN (PORCINE) 25000 UT/250ML-% IV SOLN
550.0000 [IU]/h | INTRAVENOUS | Status: DC
  Administered 2018-11-17: 550 [IU]/h via INTRAVENOUS
  Filled 2018-11-16: qty 250

## 2018-11-16 MED ORDER — PANTOPRAZOLE SODIUM 40 MG IV SOLR
40.0000 mg | Freq: Two times a day (BID) | INTRAVENOUS | Status: DC
  Administered 2018-11-16 – 2018-11-17 (×3): 40 mg via INTRAVENOUS
  Filled 2018-11-16 (×3): qty 40

## 2018-11-16 MED ORDER — METOPROLOL TARTRATE 5 MG/5ML IV SOLN
INTRAVENOUS | Status: AC
  Administered 2018-11-16: 5 mg via INTRAVENOUS
  Filled 2018-11-16: qty 5

## 2018-11-16 MED ORDER — ATORVASTATIN CALCIUM 40 MG PO TABS
40.0000 mg | ORAL_TABLET | Freq: Every day | ORAL | Status: DC
  Administered 2018-11-17 – 2018-11-18 (×2): 40 mg via ORAL
  Filled 2018-11-16 (×2): qty 1

## 2018-11-16 MED ORDER — PANTOPRAZOLE SODIUM 40 MG PO TBEC: 40.0000 mg | DELAYED_RELEASE_TABLET | Freq: Two times a day (BID) | ORAL | Status: DC

## 2018-11-16 MED ORDER — MAGNESIUM SULFATE 2 GM/50ML IV SOLN
2.0000 g | Freq: Once | INTRAVENOUS | Status: AC
  Administered 2018-11-16: 2 g via INTRAVENOUS
  Filled 2018-11-16: qty 50

## 2018-11-16 MED ORDER — FUROSEMIDE 10 MG/ML IJ SOLN
40.0000 mg | Freq: Once | INTRAMUSCULAR | Status: AC
  Administered 2018-11-16: 40 mg via INTRAVENOUS
  Filled 2018-11-16: qty 4

## 2018-11-16 MED ORDER — STERILE WATER FOR INJECTION IJ SOLN
INTRAMUSCULAR | Status: AC
  Administered 2018-11-16: 10 mL
  Filled 2018-11-16: qty 10

## 2018-11-16 MED ORDER — METOPROLOL TARTRATE 5 MG/5ML IV SOLN
5.0000 mg | Freq: Once | INTRAVENOUS | Status: AC
  Administered 2018-11-16: 5 mg via INTRAVENOUS

## 2018-11-16 NOTE — Consult Note (Signed)
Ref: Abigail Ebbs, MD   Subjective:  Awake. Extubated yesterday. O2 sat 100 % on 2 L oxygen. Right leg edema persist. Chest x-ray is stable with minimal atelectasis. Monitor shows atrial fibrillation with moderate ventricular response with occasional HR in 40's. Hypokalemia has resolved. Hypocalcemia is improving. Creatinine is 1.61  Objective:  Vital Signs in the last 24 hours: Temp:  [97.8 F (36.6 C)-98.6 F (37 C)] 97.9 F (36.6 C) (01/28 0800) Pulse Rate:  [42-150] 71 (01/28 0900) Cardiac Rhythm: Junctional rhythm (01/28 0800) Resp:  [14-28] 17 (01/28 1030) BP: (80-157)/(56-114) 140/102 (01/28 1030) SpO2:  [84 %-100 %] 84 % (01/28 0900) FiO2 (%):  [30 %] 30 % (01/27 1200) Weight:  [73.4 kg] 73.4 kg (01/28 0500)  Physical Exam: BP Readings from Last 1 Encounters:  11/16/18 (!) 140/102     Wt Readings from Last 1 Encounters:  11/16/18 73.4 kg    Weight change:  Body mass index is 26.93 kg/m. HEENT: [place]/AT, Eyes-Brown, PERL, EOMI, Conjunctiva-Pale, Sclera-Non-icteric Neck: No JVD, No bruit, Trachea midline. Lungs:  Clearing, Bilateral. Cardiac:  Regular rhythm, normal S1 and S2, no S3. II/VI systolic murmur. Abdomen:  Soft, non-tender. BS present. Extremities:  1 + right leg edema is present. No cyanosis. No clubbing. CNS: AxOx2, Cranial nerves grossly intact, moves all 4 extremities.  Skin: Warm and dry.   Intake/Output from previous day: 01/27 0701 - 01/28 0700 In: 1909 [I.V.:1436.9; IV Piggyback:472.1] Out: 1460 [Urine:1460]    Lab Results: BMET    Component Value Date/Time   NA 143 11/16/2018 0443   NA 142 11/15/2018 1608   NA 145 11/15/2018 0559   NA 144 09/17/2017 1429   NA 141 07/30/2017 1504   NA 142 06/11/2017 1451   K 4.1 11/16/2018 0443   K 4.4 11/15/2018 1608   K 3.2 (L) 11/15/2018 0559   K 3.1 (L) 09/17/2017 1429   K 4.8 07/30/2017 1504   K 4.3 06/11/2017 1451   CL 116 (H) 11/16/2018 0443   CL 118 (H) 11/15/2018 1608   CL 121 (H)  11/15/2018 0559   CO2 15 (L) 11/16/2018 0443   CO2 14 (L) 11/15/2018 1608   CO2 15 (L) 11/15/2018 0559   CO2 23 09/17/2017 1429   CO2 20 (L) 07/30/2017 1504   CO2 20 (L) 06/11/2017 1451   GLUCOSE 116 (H) 11/16/2018 0443   GLUCOSE 112 (H) 11/15/2018 1608   GLUCOSE 104 (H) 11/15/2018 0559   GLUCOSE 96 09/17/2017 1429   GLUCOSE 79 07/30/2017 1504   GLUCOSE 74 06/11/2017 1451   BUN 12 11/16/2018 0443   BUN 11 11/15/2018 1608   BUN 11 11/15/2018 0559   BUN 15.6 09/17/2017 1429   BUN 24.4 07/30/2017 1504   BUN 18.7 06/11/2017 1451   CREATININE 1.61 (H) 11/16/2018 0443   CREATININE 1.64 (H) 11/15/2018 1608   CREATININE 1.66 (H) 11/15/2018 0559   CREATININE 1.97 (H) 10/21/2018 1055   CREATININE 1.36 (H) 10/07/2018 0823   CREATININE 1.41 (H) 09/30/2018 1005   CREATININE 1.0 09/17/2017 1429   CREATININE 1.2 (H) 07/30/2017 1504   CREATININE 1.2 (H) 06/11/2017 1451   CALCIUM 8.2 (L) 11/16/2018 0443   CALCIUM 7.3 (L) 11/15/2018 1608   CALCIUM 7.0 (L) 11/15/2018 0559   CALCIUM 8.6 09/17/2017 1429   CALCIUM 9.1 07/30/2017 1504   CALCIUM 9.6 06/11/2017 1451   GFRNONAA 32 (L) 11/16/2018 0443   GFRNONAA 31 (L) 11/15/2018 1608   GFRNONAA 31 (L) 11/15/2018 0559   GFRNONAA 25 (  L) 10/21/2018 1055   GFRNONAA 39 (L) 10/07/2018 0823   GFRNONAA 37 (L) 09/30/2018 1005   GFRAA 37 (L) 11/16/2018 0443   GFRAA 36 (L) 11/15/2018 1608   GFRAA 36 (L) 11/15/2018 0559   GFRAA 29 (L) 10/21/2018 1055   GFRAA 45 (L) 10/07/2018 0823   GFRAA 43 (L) 09/30/2018 1005   CBC    Component Value Date/Time   WBC 19.3 (H) 11/16/2018 0443   RBC 2.79 (L) 11/16/2018 0443   HGB 8.4 (L) 11/16/2018 0443   HGB 9.1 (L) 10/21/2018 1055   HGB 11.3 (L) 09/17/2017 1428   HCT 27.6 (L) 11/16/2018 0443   HCT 35.8 09/17/2017 1428   PLT 190 11/16/2018 0443   PLT 271 10/21/2018 1055   PLT 231 09/17/2017 1428   MCV 98.9 11/16/2018 0443   MCV 93.2 09/17/2017 1428   MCH 30.1 11/16/2018 0443   MCHC 30.4 11/16/2018 0443    RDW 20.7 (H) 11/16/2018 0443   RDW 16.5 (H) 09/17/2017 1428   LYMPHSABS 0.6 (L) 11/14/2018 0400   LYMPHSABS 0.7 (L) 09/17/2017 1428   MONOABS 1.8 (H) 11/14/2018 0400   MONOABS 0.5 09/17/2017 1428   EOSABS 0.0 11/14/2018 0400   EOSABS 0.1 09/17/2017 1428   BASOSABS 0.1 11/14/2018 0400   BASOSABS 0.0 09/17/2017 1428   HEPATIC Function Panel Recent Labs    11/12/18 1905 11/14/18 0400 11/15/18 0931  PROT 6.6 5.2* 5.3*   HEMOGLOBIN A1C No components found for: HGA1C,  MPG CARDIAC ENZYMES Lab Results  Component Value Date   CKTOTAL 52 08/21/2017   CKMB 1.3 08/21/2017   TROPONINI 0.94 (HH) 11/13/2018   TROPONINI 0.92 (HH) 11/13/2018   TROPONINI 0.80 (HH) 11/12/2018   BNP No results for input(s): PROBNP in the last 8760 hours. TSH Recent Labs    12/13/17 2243 10/02/18 1825  TSH 1.922 0.751   CHOLESTEROL Recent Labs    12/14/17 0457 09/02/18 0152 10/03/18 0637  CHOL 167 143 136    Scheduled Meds: . atorvastatin  40 mg Oral q1800  . chlorhexidine  15 mL Mouth Rinse BID  . Chlorhexidine Gluconate Cloth  6 each Topical Daily  . fentaNYL (SUBLIMAZE) injection  50 mcg Intravenous Once  . insulin aspart  0-9 Units Subcutaneous Q4H  . mouth rinse  15 mL Mouth Rinse q12n4p  . pantoprazole (PROTONIX) IV  40 mg Intravenous Q12H  . pyridostigmine  1 mg Intramuscular Q12H  . sodium chloride flush  10-40 mL Intracatheter Q12H   Continuous Infusions: . sodium chloride 30 mL/hr at 11/16/18 1000  . amiodarone 20 mg/hr (11/16/18 1000)  . fentaNYL infusion INTRAVENOUS Stopped (11/15/18 1345)  . norepinephrine (LEVOPHED) Adult infusion Stopped (11/15/18 0742)   PRN Meds:.hydrALAZINE, sodium chloride flush  Assessment/Plan: V. Fib cardiac arrest Acute respiratory failure with hypoxemia, resolved Possible aspiration pneumonia, less likely. Acute systolic left heart failure Acute right leg DVT Multiple myeloma CAD Stents in LAD and LCx Ischemic cardiomyopathy Anemia of  chronic disease Acute renal failure Depression  Increase activity. Increase amiodarone dose as tolerated.   LOS: 4 days    [name]  MD  11/16/2018, 11:11 AM

## 2018-11-16 NOTE — Progress Notes (Signed)
[place] for IV heparin Indication: DVT  Allergies  Allergen Reactions  . Sulfa Antibiotics Rash    Patient Measurements: Height: '5\' 5"'  (165.1 cm) Weight: 161 lb 13.1 oz (73.4 kg) IBW/kg (Calculated) : 57 Heparin Dosing Weight: 72.1 kg  Vital Signs: Temp: 97.9 F (36.6 C) (01/28 0800) Temp Source: Oral (01/28 0800) BP: 141/63 (01/28 0800) Pulse Rate: 42 (01/28 0800)  Labs: Recent Labs    11/14/18 0400  11/15/18 0559 11/15/18 1608 11/15/18 2333 11/16/18 0443 11/16/18 0715  HGB 9.4*  --  8.7*  --   --  8.4*  --   HCT 31.2*  --  29.0*  --   --  27.6*  --   PLT 226  --  201  --   --  190  --   HEPARINUNFRC  --   --   --   --  1.48*  --  1.18*  CREATININE 1.19*   < > 1.66* 1.64*  --  1.61*  --    < > = values in this interval not displayed.    Estimated Creatinine Clearance: 32.2 mL/min (A) (by C-G formula based on SCr of 1.61 mg/dL (H)).   Medical History: Past Medical History:  Diagnosis Date  . ACS (acute coronary syndrome) ([place]) 06/2017  . Anemia    "off and on all my life" (12/22/2016)  . Anxiety   . Bilateral renal artery stenosis ([place]) 1999   s/p stenting  . Chronic kidney disease (CKD), stage III (moderate) ([place])    Archie Endo 12/22/2016  . Coronary artery disease 1999  . Depression   . Diverticulosis   . Duodenitis   . DVT (deep venous thrombosis) ([facility])    on Coumadin  . Dyspnea   . Gastric erosions   . Gastric ulcer   . GERD (gastroesophageal reflux disease)   . Heart murmur   . History of blood transfusion ~ 03/2016   "low HgB; practically nonexistent"  . Hyperlipidemia   . Hypertension   . MI (myocardial infarction) ([place]) 1999  . Multiple myeloma ([place]) dx'd ~ 04/2016  . PAT (paroxysmal atrial tachycardia) ([place]) 12/22/2016  . Retinal hemorrhage, right eye   . Schatzki's ring   . Tachy-brady syndrome ([place])    Archie Endo 12/22/2016  . Tubular adenoma of colon     Medications:  Infusions:  . sodium chloride 30  mL/hr at 11/16/18 0800  . amiodarone 20 mg/hr (11/16/18 0800)  . fentaNYL infusion INTRAVENOUS Stopped (11/15/18 1345)  . norepinephrine (LEVOPHED) Adult infusion Stopped (11/15/18 7262)    Assessment: 73 yo female on IV heparin for DVT with supratherapeutic heparin level despite rate reductions.  No overt bleeding or complications noted.  Goal of Therapy:  Heparin level 0.3-0.7 units/ml Monitor platelets by anticoagulation protocol: Yes   Plan:  1. Hold heparin. 2. Recheck heparin level in 1 hr.  [place] Crane, Roylene Reason, [facility] Clinical Pharmacist Phone [phone]  11/16/2018 10:42 AM

## 2018-11-16 NOTE — Consult Note (Addendum)
Cardiology Consultation:   Patient ID: Abigail [name] MRN: [id number]; DOB: [birth year]  Admit date: 11/12/2018 Date of Consult: 11/16/2018  Primary Care Provider: Nolene Ebbs, MD Primary Cardiologist: Dr. Doylene Canard Primary Electrophysiologist:  None    Patient Profile:   Abigail [name] is a 73 y.o. 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 Doylene Canard.  History of Present Illness:   Abigail [name] was recently discharge 11/02/2018 after an admission with infectious colitis, septic shock and MSOF, ultimately recovered and discharged home with family.  H&P reports during her time at home remained significantly debilitated.  She was brought in via EMS admitted 11/12/2018 with reports of syncope  EMS reports CPR in progress on arrival, ACLS, begun, VF noted, shocked 200J, > epi> VT > 300J "successful" Followed by amiodarone 310m Epi 162mAmiodarone 15010mpi 1mg30mtubated + pulse, CPR stopped  H&P reports 12 minutes to ROSC Hypothermia protocol was initiated CT brain noted lytic lesion throughout calvarium, no acute intracranial abnormalities pressors Pt made DNR 1/26 rewarming started 11/16/2018 extubated  TTE: LVEF 20-25%, akinesis of entire apical myocardium, mod MR, trivial pericardial eff (09/02/18: LVEF 45-50%, severe hypokinesis of entire apical myocardium)  Lytes needed to be relpaced  LABS on admit K+ was 2.1 >> 4.1 Mag 1.2 > 1.0 >> 2.0  Most recent K+ 4.Mag 2.0 BUN/Creat 12/1.62 (looks about her baseline) WBC 19.3 H/H 8.4/27.6 Plts 190  Home meds include amiodarone 100mg8m dilt 30mg 46m She has developed PAFib/flutter w/RVR and bradycardic rates 40's, EP is asked to weigh  in  Past Medical History:  Diagnosis Date  . ACS (acute coronary syndrome) (HCC) 0[id number]  . Anemia    "off and on all my life" (12/22/2016)  . Anxiety   . Bilateral renal artery stenosis (HCC) 1[place]   s/p stenting  . Chronic kidney disease (CKD), stage III (moderate) (HCC)  [place]  . Coronary artery disease 1999  . Depression   . Diverticulosis   . Duodenitis   . DVT (deep venous thrombosis) (HCC)    on Coumadin  . Dyspnea   . Gastric erosions   . Gastric ulcer   . GERD (gastroesophageal reflux disease)   . Heart murmur   . History of blood transfusion ~ 03/2016   "low HgB; practically nonexistent"  . Hyperlipidemia   . Hypertension   . MI (myocardial infarction) (HCC) [place]  . Multiple myeloma (HCC) [place] ~ 04/2016  . PAT (paroxysmal atrial tachycardia) (HCC) 0[place]5/2018  . Retinal hemorrhage, right eye   . Schatzki's ring   . Tachy-brady syndrome (HCC)  [id number]  . Tubular adenoma of colon     Past Surgical History:  Procedure Laterality Date  . APPENDECTOMY    . BACK SURGERY    . CATARACT EXTRACTION W/ INTRAOCULAR LENS IMPLANT Right 2014  . COLONOSCOPY WITH PROPOFOL N/A 09/04/2017   Procedure: COLONOSCOPY WITH PROPOFOL;  Surgeon: Danis,Doran Stabler [facility];  Laterality: N/A;  . CORONARY ANGIOGRAPHY N/A 01/23/2017   Procedure: Coronary Angiography;  Surgeon: Ajay KDixie Dials [facility];  Service: Cardiovascular;  Laterality: N/A;  . CORONARY ANGIOPLASTY WITH STENT PLACEMENT  1999  . CORONARY STENT INTERVENTION N/A 12/29/2016   Procedure: Coronary Stent Intervention;  Surgeon: Charolette Forward, MD;  Location: [place] [facility];  Service: Cardiovascular;  Laterality: N/A;  . CORONARY STENT INTERVENTION N/A 02/24/2017   Procedure: Coronary Stent Intervention;  Surgeon: Charolette Forward, MD;  Location: [place] [facility];  Service: Cardiovascular;  Laterality: N/A;  . ESOPHAGOGASTRODUODENOSCOPY  N/A 11/03/2015   Procedure: ESOPHAGOGASTRODUODENOSCOPY (EGD);  Surgeon: Carol Ada, MD;  Location: [facility] ENDOSCOPY;  Service: Endoscopy;  Laterality: N/A;  . ESOPHAGOGASTRODUODENOSCOPY N/A 09/03/2017   Procedure: ESOPHAGOGASTRODUODENOSCOPY (EGD);  Surgeon: Doran Stabler, MD;  Location: [place];  Service: Gastroenterology;  Laterality: N/A;  . ESOPHAGOGASTRODUODENOSCOPY (EGD) WITH PROPOFOL N/A 11/18/2016   Procedure: ESOPHAGOGASTRODUODENOSCOPY (EGD) WITH PROPOFOL;  Surgeon: Ladene Artist, MD;  Location: [facility];  Service: Endoscopy;  Laterality: N/A;  . LEFT HEART CATH AND CORONARY ANGIOGRAPHY N/A 12/24/2016   Procedure: Left Heart Cath and Coronary Angiography;  Surgeon: [name], MD;  Location: [place] [facility];  Service: Cardiovascular;  Laterality: N/A;  . LEFT HEART CATH AND CORONARY ANGIOGRAPHY N/A 02/24/2017   Procedure: Left Heart Cath and Coronary Angiography;  Surgeon: Charolette Forward, MD;  Location: [place] [facility];  Service: Cardiovascular;  Laterality: N/A;  . LEFT HEART CATH AND CORONARY ANGIOGRAPHY N/A 03/05/2017   Procedure: Left Heart Cath and Coronary Angiography;  Surgeon: [name], MD;  Location: [place] [facility];  Service: Cardiovascular;  Laterality: N/A;  . LEFT HEART CATH AND CORONARY ANGIOGRAPHY N/A 09/02/2018   Procedure: LEFT HEART CATH AND CORONARY ANGIOGRAPHY;  Surgeon: [name], MD;  Location: [place] [facility] LAB;  Service: Cardiovascular;  Laterality: N/A;  . LUMBAR [place]    . RENAL ARTERY STENT  1999   bil RAS so ? bil vs unilateral stents  . RETINAL LASER PROCEDURE Right 2012   "bleeding"  . TONSILLECTOMY    . TOTAL ABDOMINAL HYSTERECTOMY       Home Medications:  Prior to Admission medications   Medication Sig Start Date End Date Taking? Authorizing Provider  acyclovir (ZOVIRAX) 400 MG tablet TAKE 1 TABLET (400 MG TOTAL) BY MOUTH 2 (TWO) TIMES DAILY. 08/29/16  Yes Ladell Pier, MD  amiodarone (PACERONE) 200 MG tablet  Take 0.5 tablets (100 mg total) by mouth daily. 10/19/18  Yes [name], MD  atorvastatin (LIPITOR) 80 MG tablet Take 0.5 tablets (40 mg total) by mouth daily at 6 PM. 10/03/18  Yes Charolette Forward, MD  calcium carbonate (OS-CAL - DOSED IN MG OF ELEMENTAL CALCIUM) 1250 (500 Ca) MG tablet Take 1 tablet by mouth daily with breakfast.   Yes [provider]  cyclophosphamide (CYTOXAN) 50 MG capsule Take 10 capsules (550m) by mouth once weekly for 3 weeks on, 1 week off, repeat every 4 weeks. Take early in the day. Maintain hydration. 10/28/18  Yes SLadell Pier MD  dexamethasone (DECADRON) 4 MG tablet Take 5 tablets (263m weekly day of chemotherapy 10/21/18  Yes ShLadell PierMD  diltiazem (CARDIZEM) 30 MG tablet Take 1 tablet (30 mg total) by mouth every 12 (twelve) hours. 10/19/18  Yes KaDixie DialsMD  ferrous sulfate 325 (65 FE) MG tablet Take 1 tablet (325 mg total) by mouth daily with breakfast. 03/02/17  Yes KaDixie DialsMD  hydrALAZINE (APRESOLINE) 25 MG tablet Take 25 mg by mouth 3 (three) times daily. 10/10/18  Yes [provider]  nitroGLYCERIN (NITROSTAT) 0.4 MG SL tablet Place 0.4 mg under the  tongue every 5 (five) minutes as needed for chest pain. 03/20/17  Yes [provider]  oxyCODONE-acetaminophen (PERCOCET/ROXICET) 5-325 MG tablet Take 1 tablet by mouth every 4 (four) hours as needed for moderate pain or severe pain. 11/02/18  Yes Eugenie Filler, MD  pantoprazole (PROTONIX) 40 MG tablet Take 1 tablet (40 mg total) by mouth 2 (two) times daily before a meal. 09/17/18  Yes [name], MD  Potassium Chloride ER (K-TAB) 20 MEQ TBCR Take 20 mEq by mouth daily. 10/21/18  Yes Ladell Pier, MD  pyridostigmine (MESTINON) 60 MG tablet Take 0.5 tablets (30 mg total) by mouth 2 (two) times daily. 01/25/17  Yes [name], MD  tiZANidine (ZANAFLEX) 4 MG tablet Take 4 mg by mouth at bedtime as needed for muscle spasms.  04/06/18  Yes [provider]   feeding supplement, ENSURE ENLIVE, (ENSURE ENLIVE) LIQD Take 237 mLs by mouth 2 (two) times daily between meals. Patient taking differently: Take 237 mLs by mouth See admin instructions. Drink one bottle (237 ml) by mouth one or two times daily as a meal supplement 09/17/18   [name], MD  ISOSORBIDE DINITRATE PO Take by mouth.  12/30/11  [provider]    Inpatient Medications: Scheduled Meds: . atorvastatin  40 mg Oral q1800  . chlorhexidine  15 mL Mouth Rinse BID  . Chlorhexidine Gluconate Cloth  6 each Topical Daily  . fentaNYL (SUBLIMAZE) injection  50 mcg Intravenous Once  . insulin aspart  0-9 Units Subcutaneous Q4H  . mouth rinse  15 mL Mouth Rinse q12n4p  . pantoprazole (PROTONIX) IV  40 mg Intravenous Q12H  . pyridostigmine  1 mg Intramuscular Q12H  . sodium chloride flush  10-40 mL Intracatheter Q12H   Continuous Infusions: . sodium chloride 30 mL/hr at 11/16/18 1200  . amiodarone 25 mg/hr (11/16/18 1249)  . fentaNYL infusion INTRAVENOUS Stopped (11/15/18 1345)  . norepinephrine (LEVOPHED) Adult infusion Stopped (11/15/18 0742)   PRN Meds: hydrALAZINE, sodium chloride flush  Allergies:    Allergies  Allergen Reactions  . Sulfa Antibiotics Rash    Social History:   Social History   Socioeconomic History  . Marital status: Single    Spouse name: Not on file  . Number of children: Not on file  . Years of education: Not on file  . Highest education level: Not on file  Occupational History  . Not on file  Social Needs  . Financial resource strain: Not on file  . Food insecurity:    Worry: Not on file    Inability: Not on file  . Transportation needs:    Medical: Not on file    Non-medical: Not on file  Tobacco Use  . Smoking status: Current Every Day Smoker    Packs/day: 0.50    Years: 50.00    Pack years: 25.00    Types: Cigarettes  . Smokeless tobacco: Never Used  Substance and Sexual Activity  . Alcohol use: No    Alcohol/week: 0.0  standard drinks  . Drug use: No  . Sexual activity: Not Currently  Lifestyle  . Physical activity:    Days per week: Not on file    Minutes per session: Not on file  . Stress: Not on file  Relationships  . Social connections:    Talks on phone: Not on file    Gets together: Not on file    Attends religious service: Not on file    Active member of club or organization:  Not on file    Attends meetings of clubs or organizations: Not on file    Relationship status: Not on file  . Intimate partner violence:    Fear of current or ex partner: Not on file    Emotionally abused: Not on file    Physically abused: Not on file    Forced sexual activity: Not on file  Other Topics Concern  . Not on file  Social History Narrative  . Not on file    Family History:   Family History  Problem Relation Age of Onset  . Breast cancer Mother   . Breast cancer Sister   . Breast cancer Maternal Aunt   . Colon cancer Neg Hx      ROS:  Please see the history of present illness.  All other ROS reviewed and negative.     Physical Exam/Data:   Vitals:   11/16/18 1138 11/16/18 1200 11/16/18 1300 11/16/18 1400  BP:  (!) 151/117 97/79 128/84  Pulse:  (!) 104 (!) 125 (!) 149  Resp:  (!) 21 18 (!) 22  Temp: 97.9 F (36.6 C)     TempSrc: Oral     SpO2:  99% 96% 97%  Weight:      Height:        Intake/Output Summary (Last 24 hours) at 11/16/2018 1503 Last data filed at 11/16/2018 1200 Gross per 24 hour  Intake 1307.99 ml  Output 920 ml  Net 387.99 ml   Last 3 Weights 11/16/2018 11/13/2018 11/12/2018  Weight (lbs) 161 lb 13.1 oz 161 lb 13.1 oz 163 lb 2.3 oz  Weight (kg) 73.4 kg 73.4 kg 74 kg     Body mass index is 26.93 kg/m.  General:  Well nourished, well developed, in no acute distress, is awake, no interactive verbally, appears chronically ill HEENT: normal Lymph: no adenopathy Neck: no JVD Endocrine:  No thryomegaly Vascular: No carotid bruits  Cardiac:   RRR; bradycardic, 1/6 SM,  no gallops or rubs Lungs:  CTA b/l, no wheezing, rhonchi or rales with normal respiration Abd: soft, nontender Ext: no edema Musculoskeletal:  No deformities, advanced atrophy Skin: warm and dry  Neuro:  The patient is awake, looks at [place] when spoken to though offers no verbal response Psych:  Unable to assess  EKG:  The EKG was personally reviewed and demonstrates:   #1 SR #2 SB, 47bpm Telemetry:  Telemetry was personally reviewed and demonstrates:   SR, PAFib/flutter rates 140's >< SB, at times junctional, asnd some wider complex escape. Currently narrow junctional bradycardia 40's  Relevant CV Studies:  09/02/18; LHC  LM lesion is 20% stenosed.  Non-stenotic Prox LAD to Mid LAD lesion was previously treated.  Dist LAD lesion is 10% stenosed.  Non-stenotic Ost LAD to Prox LAD lesion was previously treated.  Non-stenotic Ost 2nd Diag lesion was previously treated.  Mid Cx lesion is 30% stenosed.  Prox RCA to Mid RCA lesion is 40% stenosed.  Mid RCA lesion is 60% stenosed.  Dist RCA lesion is 50% stenosed.  Ost RPDA to RPDA lesion is 10% stenosed.  Ost Ramus to Ramus lesion is 50% stenosed.  Mid LM to Dist LM lesion is 20% stenosed.  Ost 1st Diag lesion is 50% stenosed.  Non-stenotic Ost Cx to Prox Cx lesion was previously treated.  Prox RCA lesion is 30% stenosed.  Post Atrio lesion is 25% stenosed. Medical therapy. Recommend uninterrupted dual antiplatelet therapy with Aspirin 18m daily and Ticagrelor 941mtwice daily for  a minimum of 6 months (stable ischemic heart disease - Class I recommendation).  Laboratory Data:  Chemistry Recent Labs  Lab 11/15/18 0559 11/15/18 1608 11/16/18 0443  NA 145 142 143  K 3.2* 4.4 4.1  CL 121* 118* 116*  CO2 15* 14* 15*  GLUCOSE 104* 112* 116*  BUN _0 CREATININE 1.66* 1.64* 1.61*  CALCIUM 7.0* 7.3* 8.2*  GFRNONAA 31* 31* 32*  GFRAA 36* 36* 37*  ANIONGAP _1 Recent Labs  Lab 11/12/18 1905  11/14/18 0400 11/15/18 0931  PROT 6.6 5.2* 5.3*  ALBUMIN 3.2* 2.4* 2.3*  AST 150* 50* 31  ALT 66* 38 31  ALKPHOS 66 66 83  BILITOT 1.3* 0.9 0.8   Hematology Recent Labs  Lab 11/14/18 0400 11/15/18 0559 11/16/18 0443  WBC 16.5* 16.0* 19.3*  RBC 3.11* 2.90* 2.79*  HGB 9.4* 8.7* 8.4*  HCT 31.2* 29.0* 27.6*  MCV 100.3* 100.0 98.9  MCH 30.2 30.0 30.1  MCHC 30.1 30.0 30.4  RDW 20.4* 20.8* 20.7*  PLT 226 201 190   Cardiac Enzymes Recent Labs  Lab 11/12/18 1630 11/12/18 1900 11/13/18 0136 11/13/18 0557  TROPONINI 0.65* 0.80* 0.92* 0.94*    Recent Labs  Lab 11/12/18 1206  TROPIPOC 0.22*    BNPNo results for input(s): BNP, PROBNP in the last 168 hours.  DDimer No results for input(s): DDIMER in the last 168 hours.  Radiology/Studies:   Dg Chest Port 1 View Result Date: 11/16/2018 CLINICAL DATA:  Respiratory failure. EXAM: PORTABLE CHEST 1 VIEW COMPARISON:  Radiograph of November 15, 2018. FINDINGS: Stable cardiomegaly. Atherosclerosis of thoracic aorta is noted. Endotracheal and nasogastric tubes have been removed. Right internal jugular catheter is unchanged in position. No pneumothorax is noted. Minimal bibasilar subsegmental atelectasis is noted. No significant pleural effusion is noted. Bony thorax is unremarkable. IMPRESSION: Endotracheal and nasogastric tubes have been removed. Right internal jugular catheter is unchanged in position. Minimal bibasilar subsegmental atelectasis. Electronically Signed   By: Marijo Conception, M.D.   On: 11/16/2018 09:21    Vas [place] Lower Extremity Venous (dvt) Result Date: 11/16/2018  Lower Venous Study Other Indications: Status post arrest. Limitations: Body habitus and edema. Performing Technologist: Sharion Dove RVS  Examination Guidelines: A complete evaluation includes B-mode imaging, spectral Doppler, color Doppler, and power Doppler as needed of all accessible portions of each vessel. Bilateral testing is considered an integral part of  a complete examination. Limited examinations for reoccurring indications may be performed as noted.  Right Venous Findings: Summary: Right: Findings consistent with acute deep vein thrombosis involving the right femoral vein, and right popliteal vein. Left: No evidence of common femoral vein obstruction.  *See table(s) above for measurements and observations. Electronically signed by Ruta Hinds MD on 11/16/2018 at 11:50:49 AM.    Final     Assessment and Plan:   1. The patient has developed tachy-brady     Agree in stopping the amiodarone  The patient has lytic bone lesions skull, with ongoing tx for multiple myeloma Recent septic illness/infection with MSOF Functional decline Now s/p VF arrest, estimated time to ROSC, 12 minutes On arrival described as posturing with a right sided gaze Appears encephalopathic post ectubation  In d/w RN, current mental status is baseline since extubation yesterday, she does not attempt verbal interaction, or seem to follow our conversion She is maintaining good BP, and has not seemed to be symptomatic with the brady or tachy episodes  Dr. Rayann Heman has seen  the patient Recommend palliative conversation, goals of care Would keep off amiodarone and avoid any nodal/rate limiting drugs, preferring tachycardia over brady Would not plan for PPM unless she has significant recovery     For questions or updates, please contact [facility] Please consult [email or web address] for contact info under     Signed, Baldwin [place], PA-C  11/16/2018 3:03 PM;t   I have seen, examined the patient, and reviewed the above assessment and plan.  Changes to above are made where necessary.  On exam, very il appearing, RRR. Awake but not verbal.  Very unfortunately female with multiple advanced medical illnesses.  Admitted post VF arrest.   Now with episodes of asymptomatic mild tachycardia in the throws of acute medical illness and intermittent bradycardia with AV  block. She was recently admitted with significant Gi illness and infection.  She is not a candidate for ICD given her advanced myeloma and poor prognosis.  She is also not a candidate for PPM given recent infections.  I would advise stopping amiodarone allowing for higher heart rates.  I would not favor digoxin or AV nodal agents at this time.  Strong consideration should be given to palliative measures.  I do think that palliative care consultation should be considered.  Co Sign: Thompson Grayer, MD 11/16/2018 5:24 PM

## 2018-11-16 NOTE — Evaluation (Signed)
Clinical/Bedside Swallow Evaluation Patient Details  Name: Abigail [name] MRN: [id number] Date of Birth: [birth year]  Today's Date: 11/16/2018 Time: SLP Start Time (ACUTE ONLY): 6659 SLP Stop Time (ACUTE ONLY): 1000 SLP Time Calculation (min) (ACUTE ONLY): 17 min  Past Medical History:  Past Medical History:  Diagnosis Date  . ACS (acute coronary syndrome) ([place]) 06/2017  . Anemia    "off and on all my life" (12/22/2016)  . Anxiety   . Bilateral renal artery stenosis ([place]) 1999   s/p stenting  . Chronic kidney disease (CKD), stage III (moderate) ([place])    Archie Endo 12/22/2016  . Coronary artery disease 1999  . Depression   . Diverticulosis   . Duodenitis   . DVT (deep venous thrombosis) ([facility])    on Coumadin  . Dyspnea   . Gastric erosions   . Gastric ulcer   . GERD (gastroesophageal reflux disease)   . Heart murmur   . History of blood transfusion ~ 03/2016   "low HgB; practically nonexistent"  . Hyperlipidemia   . Hypertension   . MI (myocardial infarction) ([place]) 1999  . Multiple myeloma ([place]) dx'd ~ 04/2016  . PAT (paroxysmal atrial tachycardia) ([place]) 12/22/2016  . Retinal hemorrhage, right eye   . Schatzki's ring   . Tachy-brady syndrome ([place])    Archie Endo 12/22/2016  . Tubular adenoma of colon    Past Surgical History:  Past Surgical History:  Procedure Laterality Date  . APPENDECTOMY    . BACK SURGERY    . CATARACT EXTRACTION W/ INTRAOCULAR LENS IMPLANT Right 2014  . COLONOSCOPY WITH PROPOFOL N/A 09/04/2017   Procedure: COLONOSCOPY WITH PROPOFOL;  Surgeon: Doran Stabler, MD;  Location: [facility];  Service: Gastroenterology;  Laterality: N/A;  . CORONARY ANGIOGRAPHY N/A 01/23/2017   Procedure: Coronary Angiography;  Surgeon: [name], MD;  Location: [facility];  Service: Cardiovascular;  Laterality: N/A;  . CORONARY ANGIOPLASTY WITH STENT PLACEMENT  1999  . CORONARY STENT INTERVENTION N/A 12/29/2016   Procedure: Coronary Stent Intervention;  Surgeon: Charolette Forward, MD;  Location: [facility];  Service: Cardiovascular;  Laterality: N/A;  . CORONARY STENT INTERVENTION N/A 02/24/2017   Procedure: Coronary Stent Intervention;  Surgeon: Charolette Forward, MD;  Location: [facility];  Service: Cardiovascular;  Laterality: N/A;  . ESOPHAGOGASTRODUODENOSCOPY N/A 11/03/2015   Procedure: ESOPHAGOGASTRODUODENOSCOPY (EGD);  Surgeon: Carol Ada, MD;  Location: [facility];  Service: Endoscopy;  Laterality: N/A;  . ESOPHAGOGASTRODUODENOSCOPY N/A 09/03/2017   Procedure: ESOPHAGOGASTRODUODENOSCOPY (EGD);  Surgeon: Doran Stabler, MD;  Location: [facility];  Service: Gastroenterology;  Laterality: N/A;  . ESOPHAGOGASTRODUODENOSCOPY (EGD) WITH PROPOFOL N/A 11/18/2016   Procedure: ESOPHAGOGASTRODUODENOSCOPY (EGD) WITH PROPOFOL;  Surgeon: Ladene Artist, MD;  Location: [facility];  Service: Endoscopy;  Laterality: N/A;  . LEFT HEART CATH AND CORONARY ANGIOGRAPHY N/A 12/24/2016   Procedure: Left Heart Cath and Coronary Angiography;  Surgeon: [name], MD;  Location: [facility];  Service: Cardiovascular;  Laterality: N/A;  . LEFT HEART CATH AND CORONARY ANGIOGRAPHY N/A 02/24/2017   Procedure: Left Heart Cath and Coronary Angiography;  Surgeon: Charolette Forward, MD;  Location: [facility];  Service: Cardiovascular;  Laterality: N/A;  . LEFT HEART CATH AND CORONARY ANGIOGRAPHY N/A 03/05/2017   Procedure: Left Heart Cath and Coronary Angiography;  Surgeon: [name], MD;  Location: [facility];  Service: Cardiovascular;  Laterality: N/A;  . LEFT HEART CATH AND CORONARY ANGIOGRAPHY N/A 09/02/2018   Procedure:  LEFT HEART CATH AND CORONARY ANGIOGRAPHY;  Surgeon: [name], MD;  Location: [facility];  Service: Cardiovascular;  Laterality: N/A;  . LUMBAR [place]    . RENAL ARTERY STENT  1999   bil RAS so ? bil vs unilateral stents  . RETINAL LASER PROCEDURE Right 2012   "bleeding"  . TONSILLECTOMY    . TOTAL  ABDOMINAL HYSTERECTOMY     HPI:  Pt is a 73 yo female admitted post VT/VF arrest x2, shocked 3x and intubated in the field with approximately 12 minutes of cardiac arrest prior to achieving ROSC.  Upon arrival to the ED she was posturing with a R sided gaze preference. ETT 1/24-1/27. PMH: GERD, schatzki's ring, multiple myeloma, FTT, recent hospitalization for infectious colitis and septic shock, myasthenia gravis, afib, CKD, MI, HTN, HLD, CAD   Assessment / Plan / Recommendation Clinical Impression  Pt has signs of dysphagia and suspected aspiration, including multiple swallows and intermittent, weak coughing. She does have a weak cough and low vocal intensity, which may be secondary to intubation, although she also has consistently delayed responses and a likely cognitive component as well. After a few trials, pt proceeded to regurgitate the POs that were provided by SLP as well as what appeared to be gastric content. Oral suction was used to clear oral cavity and oral care was provided after. She was left in an upright position and denied any feelings of nausea. Would keep NPO for now, but will continue to follow for PO readiness. SLP Visit Diagnosis: Dysphagia, unspecified (R13.10)    Aspiration Risk  Severe aspiration risk    Diet Recommendation NPO   Medication Administration: Via alternative means    Other  Recommendations Oral Care Recommendations: Oral care QID Other Recommendations: Have oral suction available   Follow up Recommendations (tba)      Frequency and Duration min 2x/week  2 weeks       Prognosis Prognosis for Safe Diet Advancement: Good Barriers to Reach Goals: Cognitive deficits      Swallow Study   General HPI: Pt is a 73 yo female admitted post VT/VF arrest x2, shocked 3x and intubated in the field with approximately 12 minutes of cardiac arrest prior to achieving ROSC.  Upon arrival to the ED she was posturing with a R sided gaze preference. ETT 1/24-1/27.  PMH: GERD, schatzki's ring, multiple myeloma, FTT, recent hospitalization for infectious colitis and septic shock, myasthenia gravis, afib, CKD, MI, HTN, HLD, CAD Type of Study: Bedside Swallow Evaluation Previous Swallow Assessment: none in chart Diet Prior to this Study: NPO Temperature Spikes Noted: No Respiratory Status: Nasal cannula History of Recent Intubation: Yes Length of Intubations (days): 4 days Date extubated: 11/15/18 Behavior/Cognition: Alert;Cooperative;Requires cueing Oral Cavity Assessment: Within Functional Limits Oral Care Completed by SLP: Yes Oral Cavity - Dentition: Edentulous Vision: Functional for self-feeding Self-Feeding Abilities: Needs assist Patient Positioning: Upright in bed Baseline Vocal Quality: Low vocal intensity Volitional Cough: Weak Volitional Swallow: Able to elicit    Oral/Motor/Sensory Function Overall Oral Motor/Sensory Function: Generalized oral weakness(symmetrical, no focal weakness)   Ice Chips Ice chips: Within functional limits Presentation: Spoon   Thin Liquid Thin Liquid: Impaired Presentation: Cup;Spoon;Straw Pharyngeal  Phase Impairments: Throat Clearing - Immediate;Cough - Delayed;Cough - Immediate    Nectar Thick Nectar Thick Liquid: Not tested   Honey Thick Honey Thick Liquid: Not tested   Puree Puree: Impaired Presentation: Spoon Pharyngeal Phase Impairments: Multiple swallows   Solid     Solid:  Not tested      [name] Gayanne Prescott 11/16/2018,10:14 AM  [name], M.A. [place] [facility] Environmental education officer [phone] Office [phone]

## 2018-11-16 NOTE — Progress Notes (Addendum)
At 1053, per Dr. Merrilee Jansky request, changed amio rate to from his previous requested 20 mg/hr to 25 mg/hr. HR still fluctuating from tachy to brady - Dr. Doylene Canard aware.

## 2018-11-16 NOTE — Progress Notes (Signed)
ANTICOAGULATION CONSULT NOTE  Pharmacy Consult for IV heparin Indication: DVT  Allergies  Allergen Reactions  . Sulfa Antibiotics Rash    Patient Measurements: Height: 5' 5" (165.1 cm) Weight: 161 lb 13.1 oz (73.4 kg) IBW/kg (Calculated) : 57 Heparin Dosing Weight: 72.1 kg  Vital Signs: Temp: 97.9 F (36.6 C) (01/28 1138) Temp Source: Oral (01/28 1138) BP: 151/117 (01/28 1200) Pulse Rate: 104 (01/28 1200)  Labs: Recent Labs    11/14/18 0400  11/15/18 0559 11/15/18 1608 11/15/18 2333 11/16/18 0443 11/16/18 0715 11/16/18 1221  HGB 9.4*  --  8.7*  --   --  8.4*  --   --   HCT 31.2*  --  29.0*  --   --  27.6*  --   --   PLT 226  --  201  --   --  190  --   --   HEPARINUNFRC  --   --   --   --  1.48*  --  1.18* 1.09*  CREATININE 1.19*   < > 1.66* 1.64*  --  1.61*  --   --    < > = values in this interval not displayed.    Estimated Creatinine Clearance: 32.2 mL/min (A) (by C-G formula based on SCr of 1.61 mg/dL (H)).   Medical History: Past Medical History:  Diagnosis Date  . ACS (acute coronary syndrome) ([facility]) 06/2017  . Anemia    "off and on all my life" (12/22/2016)  . Anxiety   . Bilateral renal artery stenosis ([facility]) 1999   s/p stenting  . Chronic kidney disease (CKD), stage III (moderate) ([facility])    /notes 12/22/2016  . Coronary artery disease 1999  . Depression   . Diverticulosis   . Duodenitis   . DVT (deep venous thrombosis) ([facility])    on Coumadin  . Dyspnea   . Gastric erosions   . Gastric ulcer   . GERD (gastroesophageal reflux disease)   . Heart murmur   . History of blood transfusion ~ 03/2016   "low HgB; practically nonexistent"  . Hyperlipidemia   . Hypertension   . MI (myocardial infarction) ([facility]) 1999  . Multiple myeloma ([facility]) dx'd ~ 04/2016  . PAT (paroxysmal atrial tachycardia) ([facility]) 12/22/2016  . Retinal hemorrhage, right eye   . Schatzki's ring   . Tachy-brady syndrome ([facility])    /notes 12/22/2016  . Tubular adenoma of colon      Medications:  Infusions:  . sodium chloride 30 mL/hr at 11/16/18 1200  . amiodarone 25 mg/hr (11/16/18 1249)  . fentaNYL infusion INTRAVENOUS Stopped (11/15/18 1345)  . norepinephrine (LEVOPHED) Adult infusion Stopped (11/15/18 0742)    Assessment: 73 yo female on IV heparin for DVT with supratherapeutic heparin level despite rate reductions.  No overt bleeding or complications noted.  Heparin level remains elevated despite being off of heparin for almost 2 hrs.  Confirmed with RN, level drawn from arm opposite to where heparin was running.  Goal of Therapy:  Heparin level 0.3-0.7 units/ml Monitor platelets by anticoagulation protocol: Yes   Plan:  1. Continue to hold heparin. 2. Recheck heparin level in 1 hr.  Jessica Carney, Pharm D, BCPS, BCCP Clinical Pharmacist Phone [phone]  11/16/2018 1:06 PM   

## 2018-11-16 NOTE — Progress Notes (Addendum)
 NAME:  Abigail [name], MRN:  [id number], DOB:  02/15/1946, LOS: 3 ADMISSION DATE:  11/12/2018 REFERRING MD: steinl, CHIEF COMPLAINT:Cardiac arrest  Brief History   73 year old woman who was admit 1/23 after VT/VF arrest with overall time to ROSC estimated at over 12 minutes. Hypothermia protocol was initiated. The arrest happened while she was on the phone and suddenly stopped talking, began to look off but appeared to be breathing. EMS was called and on arrival she was found to be in a shockable rhythm. She was defibrillated three times and CPR was initiated, they obtained ROSC in about 2 minutes. She was intubated by EMS. She suffered cardiac arrest prior to arrival and had ROSC in about 10 minutes. On arrival to the ED she was posturing with a right sided gaze preference. She was made DNR on admission.   Past Medical History  Multiple myeloma currently on chemotherapy  Failure to thrive  Recently hospitalized for infectious colitis and septic shock requiring vasopressors discharged 1/14  Myasthenia gravis  Atrial fibrillation  CKD III  Moderate mitral valve regurgitation and tricuspid valve regurgitation  Anemia of chronic disease with hx of GI bleed    Significant Hospital Events   1/24 admit post VT/VF arrest, shocked 3x and intubated in the field with approximately 12 minutes of cardiac arrest prior to achieving ROSC.   Consults:  Cardiology  CCM   Procedures:  ETT 1/24 >  R IJ CVC 1/24 >   Significant Diagnostic Tests:  CT brain 1/24 no acute intracranial abnormalities, age related atrophy, old small vessel infarct of the left frontal lobe, lytic lesions throughout the calvarium   TTE 1/25 akinesia of the apical myocardium, hypokinesia of the anterior lateral and inferior myocardium, LVEF 20-25%, dilated LA, moderately dilated RV   Micro Data:  Respiratory culture 1/24>>> MRSA neg   Antimicrobials:    Interim history/subjective:   Fluctuating HR 30 to  140s overnight, cardiology increased the rate of the amiodarone gtt   Objective   Blood pressure (!) 117/54, pulse (!) 48, temperature 98.8 F (37.1 C), temperature source Esophageal, resp. rate 20, height 5' 5" (1.651 m), weight 73.4 kg, SpO2 100 %. CVP:  [8 mmHg-75 mmHg] 8 mmHg  Vent Mode: CPAP;PSV FiO2 (%):  [30 %] 30 % Set Rate:  [12 bmp] 12 bmp Vt Set:  [400 mL] 400 mL PEEP:  [5 cmH20] 5 cmH20 Pressure Support:  [5 cmH20] 5 cmH20 Plateau Pressure:  [9 cmH20-12 cmH20] 11 cmH20   Intake/Output Summary (Last 24 hours) at 11/15/2018 0911 Last data filed at 11/15/2018 0800 Gross per 24 hour  Intake 2335.96 ml  Output 500 ml  Net 1835.96 ml       Filed Weights   11/12/18 1206 11/13/18 1936  Weight: 74 kg 73.4 kg    Examination:  General: ill appearing  HENT: ETT, OGT  Lungs: clear to auscultation  Cardiovascular: slow rate, regular rhythm, no murmur  Abdomen: soft, tenderness to palpation of the epigastrum   Extremities: 2+ edema in the right lower extremity  Neuro: awake, appears fatigued, following commands  GU: external urethral catheter in place with cloudy yellow urine draining   Resolved Hospital Problem list   Lactic acidosis with resultant anion gap metabolic acidosis  Acute metabolic encephalopathy, with concern for an anoxic brain injury  Acute respiratory failure post cardiac arrest -Extubated 1/27 Shock liver   Assessment & Plan:   VT/ VF cardiac arrest  S/p 12 minutes of resuscitation with   3x shock, cooling was initiated on arrival. Troponin peaked at 0/94.  - ? transition amiodarone to po   - await cardiology recommendations regarding cardiac cath   Hypokalemia  Resolved   RLE DVT  - heparin gtt, transition to po AC when transfered out of the ICU   Hypocalcemia  Hypomagnesemia - resolved  Hypophosphatemia - resolved  - follow up ionized calcium tomorrow   History of atrial fibrillation  Coronary artery disease  Mitral valve  regurgitation  HTN  Amiodarone  Tele monitoring  - home atorvastatin   - continue to hold home diltiazem, hydralazine   Acute on chronic renal failure post cardiac arrest  CKD Stage III  Volume overload  Acute hypoxic respiratory failure  Diuresed with IV lasix 80 mg, had 1.4 L urine output with 1 unmeasured occurrence. CXR today with resolution of effusion seen yesterday.  - wean oxygen   Multiple Myeloma with diffuse lytic bone lesions  Most recent zometa infusion 10/08/2017, chemotherapy currently on hold.   History of myasthenia gravis  - on home pyridostigmine   Best practice:  Diet: NPO  Pain/Anxiety/Delirium protocol (if indicated): propofol on hold  VAP protocol (if indicated): Yes DVT prophylaxis: heparin gtt  GI prophylaxis: po protonix  Glucose control: CBG monitoring with SSI  Mobility: bedrest Code Status: DNR Family Communication: no family at bedside will reach out to family today  Disposition: may transfer from the ICU today   Labs   CBC: LastLabs          Recent Labs  Lab 11/12/18 1200 11/12/18 1741 11/13/18 0150 11/13/18 0453 11/14/18 0400 11/15/18 0559  WBC 12.3*  --  15.5*  --  16.5* 16.0*  NEUTROABS  --   --   --   --  13.5*  --   HGB 10.3* 10.9* 9.1* 9.5* 9.4* 8.7*  HCT 36.1 32.0* 29.3* 28.0* 31.2* 29.0*  MCV 102.6*  --  95.1  --  100.3* 100.0  PLT 288  --  262  --  226 201      Basic Metabolic Panel: LastLabs  Recent Labs  Lab 11/12/18 1630  11/13/18 0150  11/14/18 0204 11/14/18 0400 11/14/18 0606 11/14/18 0844 11/15/18 0559  NA 140   < > 142   < > 144 144 144 144 145  K 2.8*   < > 3.9   < > 3.6 3.8 3.8 3.7 3.2*  CL 104   < > 112*   < > 118* 118* 117* 118* 121*  CO2 15*   < > 18*   < > 20* 17* 16* 16* 15*  GLUCOSE 289*   < > 93   < > 116* 112* 103* 109* 104*  BUN 10   < > 8   < > 7* 5* <5* 5* 11  CREATININE 1.43*   < > 1.13*   < > 1.15* 1.19* 1.29* 1.34* 1.66*  CALCIUM 8.2*   < > 7.6*   < > 6.7* 6.8* 6.7* 6.7*  7.0*  MG 1.2*  --  1.0*  --   --   --   --   --  1.7  PHOS  --   --  <1.0*  --   --   --   --   --   --    < > = values in this interval not displayed.     GFR: Estimated Creatinine Clearance: 31.2 mL/min (A) (by C-G formula based on SCr of 1.66 mg/dL (H)). LastLabs          Recent Labs  Lab 11/12/18 1200 11/13/18 0150 11/14/18 0400 11/15/18 0559  WBC 12.3* 15.5* 16.5* 16.0*      Liver Function Tests: LastLabs       Recent Labs  Lab 11/12/18 1200 11/12/18 1905 11/14/18 0400  AST 140* 150* 50*  ALT 68* 66* 38  ALKPHOS 68 66 66  BILITOT 1.5* 1.3* 0.9  PROT 7.1 6.6 5.2*  ALBUMIN 3.6 3.2* 2.4*     LastLabs  No results for input(s): LIPASE, AMYLASE in the last 168 hours.   LastLabs  No results for input(s): AMMONIA in the last 168 hours.    ABG Labs(Brief)          Component Value Date/Time   PHART 7.543 (H) 11/13/2018 0453   PCO2ART 20.9 (L) 11/13/2018 0453   PO2ART 172.0 (H) 11/13/2018 0453   HCO3 18.6 (L) 11/13/2018 0453   TCO2 19 (L) 11/13/2018 0453   ACIDBASEDEF 4.0 (H) 11/13/2018 0453   O2SAT 100.0 11/13/2018 0453       Coagulation Profile: LastLabs      Recent Labs  Lab 11/12/18 1630 11/13/18 0617  INR 1.15 1.17      Cardiac Enzymes: LastLabs        Recent Labs  Lab 11/12/18 1630 11/12/18 1900 11/13/18 0136 11/13/18 0557  TROPONINI 0.65* 0.80* 0.92* 0.94*      HbA1C: LastLabs         Hgb A1c MFr Bld  Date/Time Value Ref Range Status  12/31/2011 06:41 AM 5.7 (H) <5.7 % Final    Comment:    (NOTE)                                                                       According to the ADA Clinical Practice Recommendations for 2011, when HbA1c is used as a screening test:  >=6.5%   Diagnostic of Diabetes Mellitus           (if abnormal result is confirmed) 5.7-6.4%   Increased risk of developing Diabetes Mellitus References:Diagnosis and Classification of Diabetes  [place],Diabetes Care,2011,34(Suppl 1):S62-S69 and Standards of Medical Care in         Diabetes - 2011,Diabetes Care,2011,34 (Suppl 1):S11-S61.      CBG: LastLabs         Recent Labs  Lab 11/14/18 1605 11/14/18 2006 11/14/18 2337 11/15/18 0311 11/15/18 0748  GLUCAP 97 105* 93 89 90      Review of Systems:     Past Medical History  She,  has a past medical history of ACS (acute coronary syndrome) (HCC) (06/2017), Anemia, Anxiety, Bilateral renal artery stenosis (HCC) (1999), Chronic kidney disease (CKD), stage III (moderate) (HCC), Coronary artery disease (1999), Depression, Diverticulosis, Duodenitis, DVT (deep venous thrombosis) (HCC), Dyspnea, Gastric erosions, Gastric ulcer, GERD (gastroesophageal reflux disease), Heart murmur, History of blood transfusion (~ 03/2016), Hyperlipidemia, Hypertension, MI (myocardial infarction) (HCC) (1999), Multiple myeloma (HCC) (dx'd ~ 04/2016), PAT (paroxysmal atrial tachycardia) (HCC) (12/22/2016), Retinal hemorrhage, right eye, Schatzki's ring, Tachy-brady syndrome (HCC), and Tubular adenoma of colon.   Surgical History         Past Surgical History:  Procedure Laterality Date  . APPENDECTOMY    . BACK SURGERY    . CATARACT EXTRACTION W/ INTRAOCULAR LENS IMPLANT   Right 2014  . COLONOSCOPY WITH PROPOFOL N/A 09/04/2017   Procedure: COLONOSCOPY WITH PROPOFOL;  Surgeon: Doran Stabler, MD;  Location: [facility];  Service: Gastroenterology;  Laterality: N/A;  . CORONARY ANGIOGRAPHY N/A 01/23/2017   Procedure: Coronary Angiography;  Surgeon: [name], MD;  Location: [facility];  Service: Cardiovascular;  Laterality: N/A;  . CORONARY ANGIOPLASTY WITH STENT PLACEMENT  1999  . CORONARY STENT INTERVENTION N/A 12/29/2016   Procedure: Coronary Stent Intervention;  Surgeon: Charolette Forward, MD;  Location: [facility];  Service: Cardiovascular;  Laterality: N/A;  . CORONARY STENT INTERVENTION N/A 02/24/2017    Procedure: Coronary Stent Intervention;  Surgeon: Charolette Forward, MD;  Location: [facility];  Service: Cardiovascular;  Laterality: N/A;  . ESOPHAGOGASTRODUODENOSCOPY N/A 11/03/2015   Procedure: ESOPHAGOGASTRODUODENOSCOPY (EGD);  Surgeon: Carol Ada, MD;  Location: [facility];  Service: Endoscopy;  Laterality: N/A;  . ESOPHAGOGASTRODUODENOSCOPY N/A 09/03/2017   Procedure: ESOPHAGOGASTRODUODENOSCOPY (EGD);  Surgeon: Doran Stabler, MD;  Location: [place];  Service: Gastroenterology;  Laterality: N/A;  . ESOPHAGOGASTRODUODENOSCOPY (EGD) WITH PROPOFOL N/A 11/18/2016   Procedure: ESOPHAGOGASTRODUODENOSCOPY (EGD) WITH PROPOFOL;  Surgeon: Ladene Artist, MD;  Location: [facility];  Service: Endoscopy;  Laterality: N/A;  . LEFT HEART CATH AND CORONARY ANGIOGRAPHY N/A 12/24/2016   Procedure: Left Heart Cath and Coronary Angiography;  Surgeon: [name], MD;  Location: [place] [facility];  Service: Cardiovascular;  Laterality: N/A;  . LEFT HEART CATH AND CORONARY ANGIOGRAPHY N/A 02/24/2017   Procedure: Left Heart Cath and Coronary Angiography;  Surgeon: Charolette Forward, MD;  Location: [facility];  Service: Cardiovascular;  Laterality: N/A;  . LEFT HEART CATH AND CORONARY ANGIOGRAPHY N/A 03/05/2017   Procedure: Left Heart Cath and Coronary Angiography;  Surgeon: [name], MD;  Location: [place] [facility];  Service: Cardiovascular;  Laterality: N/A;  . LEFT HEART CATH AND CORONARY ANGIOGRAPHY N/A 09/02/2018   Procedure: LEFT HEART CATH AND CORONARY ANGIOGRAPHY;  Surgeon: [name], MD;  Location: [facility];  Service: Cardiovascular;  Laterality: N/A;  . LUMBAR [place]    . RENAL ARTERY STENT  1999   bil RAS so ? bil vs unilateral stents  . RETINAL LASER PROCEDURE Right 2012   "bleeding"  . TONSILLECTOMY    . TOTAL ABDOMINAL HYSTERECTOMY       Social History   reports that she has been smoking cigarettes. She has a 25.00 pack-year  smoking history. She has never used smokeless tobacco. She reports that she does not drink alcohol or use drugs.   Family History   Her family history includes Breast cancer in her maternal aunt, mother, and sister. There is no history of Colon cancer.   Allergies     Allergies  Allergen Reactions  . Sulfa Antibiotics Rash     Home Medications         Prior to Admission medications   Medication Sig Start Date End Date Taking? Authorizing Provider  acyclovir (ZOVIRAX) 400 MG tablet TAKE 1 TABLET (400 MG TOTAL) BY MOUTH 2 (TWO) TIMES DAILY. 08/29/16  Yes Ladell Pier, MD  amiodarone (PACERONE) 200 MG tablet Take 0.5 tablets (100 mg total) by mouth daily. 10/19/18  Yes [name], MD  atorvastatin (LIPITOR) 80 MG tablet Take 0.5 tablets (40 mg total) by mouth daily at 6 PM. 10/03/18  Yes Charolette Forward, MD  calcium carbonate (OS-CAL - DOSED IN MG OF ELEMENTAL CALCIUM) 1250 (500 Ca) MG tablet  Take 1 tablet by mouth daily with breakfast.   Yes [provider]  cyclophosphamide (CYTOXAN) 50 MG capsule Take 10 capsules (500mg) by mouth once weekly for 3 weeks on, 1 week off, repeat every 4 weeks. Take early in the day. Maintain hydration. 10/28/18  Yes Sherrill, Gary B, MD  dexamethasone (DECADRON) 4 MG tablet Take 5 tablets (20mg) weekly day of chemotherapy 10/21/18  Yes Sherrill, Gary B, MD  diltiazem (CARDIZEM) 30 MG tablet Take 1 tablet (30 mg total) by mouth every 12 (twelve) hours. 10/19/18  Yes Kadakia, Ajay, MD  ferrous sulfate 325 (65 FE) MG tablet Take 1 tablet (325 mg total) by mouth daily with breakfast. 03/02/17  Yes Kadakia, Ajay, MD  hydrALAZINE (APRESOLINE) 25 MG tablet Take 25 mg by mouth 3 (three) times daily. 10/10/18  Yes [provider]  nitroGLYCERIN (NITROSTAT) 0.4 MG SL tablet Place 0.4 mg under the tongue every 5 (five) minutes as needed for chest pain. 03/20/17  Yes [provider]  oxyCODONE-acetaminophen (PERCOCET/ROXICET) 5-325  MG tablet Take 1 tablet by mouth every 4 (four) hours as needed for moderate pain or severe pain. 11/02/18  Yes Thompson, Daniel V, MD  pantoprazole (PROTONIX) 40 MG tablet Take 1 tablet (40 mg total) by mouth 2 (two) times daily before a meal. 09/17/18  Yes Kadakia, Ajay, MD  Potassium Chloride ER (K-TAB) 20 MEQ TBCR Take 20 mEq by mouth daily. 10/21/18  Yes Sherrill, Gary B, MD  pyridostigmine (MESTINON) 60 MG tablet Take 0.5 tablets (30 mg total) by mouth 2 (two) times daily. 01/25/17  Yes Kadakia, Ajay, MD  tiZANidine (ZANAFLEX) 4 MG tablet Take 4 mg by mouth at bedtime as needed for muscle spasms.  04/06/18  Yes [provider]  feeding supplement, ENSURE ENLIVE, (ENSURE ENLIVE) LIQD Take 237 mLs by mouth 2 (two) times daily between meals. Patient taking differently: Take 237 mLs by mouth See admin instructions. Drink one bottle (237 ml) by mouth one or two times daily as a meal supplement 09/17/18   Kadakia, Ajay, MD  ISOSORBIDE DINITRATE PO Take by mouth.  12/30/11  [provider]    Attending Note:  72 year old female with VT/VF who was extubated yesterday and done well overnight, no events, no new complaints except for episodic bradycardia.  On exam, alert and interactive, moving all ext to command.  I reviewed CXR myself, no acute disease noted.  Discussed with resident.  Cardiac arrest/VT  - Cards following  - Amiodarone  Pleural effusion:  - Follow clinically  - Improved with diureses  - CXR if changes  Hypoxemia:  - Titrate O2 for sat of 88-92%  - May need ambulatory desaturation study prior to discharge for home O2  Pulmonary edema:  - Diureses  - Treat cardiac  Transfer to progressive care unit and to TRH service with PCCM off 1/29  Patient seen and examined, agree with above note.  I dictated the care and orders written for this patient under my direction.  Yacoub, Wesam G, MD [phone] 

## 2018-11-16 NOTE — Progress Notes (Signed)
[place] for Heparin Indication: DVT  Allergies  Allergen Reactions  . Sulfa Antibiotics Rash    Patient Measurements: Height: 5\' 5"  (165.1 cm) Weight: 161 lb 13.1 oz (73.4 kg)(bed wt) IBW/kg (Calculated) : 57 Heparin Dosing Weight:   Vital Signs: Temp: 98.2 F (36.8 C) (01/27 2000) Temp Source: Oral (01/27 2000) BP: 108/85 (01/28 0030) Pulse Rate: 142 (01/28 0030)  Labs: Recent Labs    11/13/18 0136  11/13/18 0150 11/13/18 0453 11/13/18 0557 11/13/18 0617 11/13/18 0622  11/14/18 0400  11/14/18 0844 11/15/18 0559 11/15/18 1608 11/15/18 2333  HGB  --    < > 9.1* 9.5*  --   --   --   --  9.4*  --   --  8.7*  --   --   HCT  --    < > 29.3* 28.0*  --   --   --   --  31.2*  --   --  29.0*  --   --   PLT  --   --  262  --   --   --   --   --  226  --   --  201  --   --   APTT  --   --   --   --   --   --  40*  --   --   --   --   --   --   --   LABPROT  --   --   --   --   --  14.8  --   --   --   --   --   --   --   --   INR  --   --   --   --   --  1.17  --   --   --   --   --   --   --   --   HEPARINUNFRC  --   --   --   --   --   --   --   --   --   --   --   --   --  1.48*  CREATININE  --    < > 1.13*  --  1.09*  --   --    < > 1.19*   < > 1.34* 1.66* 1.64*  --   TROPONINI 0.92*  --   --   --  0.94*  --   --   --   --   --   --   --   --   --    < > = values in this interval not displayed.    Estimated Creatinine Clearance: 31.6 mL/min (A) (by C-G formula based on SCr of 1.64 mg/dL (H)).     Medications:  Infusions:  . sodium chloride 30 mL/hr at 11/16/18 0000  . amiodarone 20 mg/hr (11/16/18 0000)  . fentaNYL infusion INTRAVENOUS Stopped (11/15/18 1345)  . heparin 950 Units/hr (11/16/18 0000)  . norepinephrine (LEVOPHED) Adult infusion Stopped (11/15/18 5053)    Assessment: 73 yo female admitted s/p cardiac arrest, now with new DVT found on dopplers.  Hx afib in the past, not on anticoagulation PTA due to hx  GIB.  Pharmacy asked to begin anticoagulation with IV Heparin. Initial heparin level 1.48 units/ml.  Goal of Therapy:  Heparin level 0.3-0.7 units/ml Monitor platelets by anticoagulation protocol: Yes   Plan:  1. Decrease IV heparin to 750 units/hr.  2. Check heparin level in 6-8 hrs 3. Daily heparin level and CBC. Thanks for allowing pharmacy to be a part of this patient's care.  Excell Seltzer, PharmD Clinical Pharmacist  11/16/2018 12:48 AM

## 2018-11-16 NOTE — Progress Notes (Signed)
OT Cancellation Note  Patient Details Name: Abigail [name] MRN: [id number] DOB: 1946-04-15   Cancelled Treatment:    Reason Eval/Treat Not Completed: Medical issues which prohibited therapy(continuous heparin with +DVT (hold for 24 hour mark) )  Richelle Ito, OTR/L  Acute Rehabilitation Services Pager: [phone] Office: [phone] .  11/16/2018, 8:48 AM

## 2018-11-16 NOTE — Progress Notes (Signed)
ANTICOAGULATION CONSULT NOTE - Follow Up Consult  Pharmacy Consult for Heparin Indication: DVT  Allergies  Allergen Reactions  . Sulfa Antibiotics Rash    Patient Measurements: Height: 5\' 5"  (165.1 cm) Weight: 161 lb 13.1 oz (73.4 kg) IBW/kg (Calculated) : 57 Heparin Dosing Weight: 72 kg  Vital Signs: Temp: 97.7 F (36.5 C) (01/28 1542) Temp Source: Oral (01/28 1542) BP: 147/113 (01/28 1700) Pulse Rate: 45 (01/28 1700)  Labs: Recent Labs    11/14/18 0400  11/15/18 0559 11/15/18 1608  11/16/18 0443 11/16/18 0715 11/16/18 1221 11/16/18 1518  HGB 9.4*  --  8.7*  --   --  8.4*  --   --   --   HCT 31.2*  --  29.0*  --   --  27.6*  --   --   --   PLT 226  --  201  --   --  190  --   --   --   HEPARINUNFRC  --   --   --   --    < >  --  1.18* 1.09* 0.42  CREATININE 1.19*   < > 1.66* 1.64*  --  1.61*  --   --   --    < > = values in this interval not displayed.    Estimated Creatinine Clearance: 32.2 mL/min (A) (by C-G formula based on SCr of 1.61 mg/dL (H)).  Assessment:  73 yo female on IV heparin for DVT with supratherapeutic heparin level despite rate reductions.  No overt bleeding or complications noted.    Heparin drip has been on hold for several hours.  Level is now therapeutic (0.42) without heparin infusing. Last infusion rate was 750 units/hr.  Goal of Therapy:  Heparin level 0.3-0.7 units/ml Monitor platelets by anticoagulation protocol: Yes   Plan:   Resume heparin drip at 550 units/hr  Heparin level ~8 hrs after drip resumes.  Daily heparin level and CBC.  Arty Baumgartner, [place] Page: [phone] or phone: [phone] 11/16/2018,5:33 PM

## 2018-11-16 NOTE — Progress Notes (Signed)
PT Cancellation Note  Patient Details Name: Abigail [name] MRN: [id number] DOB: [birth year]   Cancelled Treatment:    Reason Eval/Treat Not Completed: Medical issues which prohibited therapy(+DVT will await heparin therapeutic levels prior to mobility, per protocol 24 hrs post initiation of continuous heparin). Will check back as time permits    Duncan Dull 11/16/2018, 8:32 AM Alben Deeds, PT DPT  Board Certified Neurologic Specialist [place] Pager [phone] Office [phone]

## 2018-11-16 NOTE — Progress Notes (Signed)
Wasted 200 cc of fentanyl with Doretha Sou RN down the sink

## 2018-11-17 ENCOUNTER — Inpatient Hospital Stay: Payer: Medicare HMO | Admitting: Nurse Practitioner

## 2018-11-17 ENCOUNTER — Inpatient Hospital Stay: Payer: Medicare HMO

## 2018-11-17 DIAGNOSIS — Z515 Encounter for palliative care: Secondary | ICD-10-CM

## 2018-11-17 DIAGNOSIS — J9601 Acute respiratory failure with hypoxia: Secondary | ICD-10-CM

## 2018-11-17 DIAGNOSIS — Z7189 Other specified counseling: Secondary | ICD-10-CM

## 2018-11-17 DIAGNOSIS — N179 Acute kidney failure, unspecified: Secondary | ICD-10-CM

## 2018-11-17 LAB — BASIC METABOLIC PANEL
ANION GAP: 11 (ref 5–15)
BUN: 18 mg/dL (ref 8–23)
CO2: 15 mmol/L — ABNORMAL LOW (ref 22–32)
Calcium: 9.2 mg/dL (ref 8.9–10.3)
Chloride: 119 mmol/L — ABNORMAL HIGH (ref 98–111)
Creatinine, Ser: 1.64 mg/dL — ABNORMAL HIGH (ref 0.44–1.00)
GFR calc Af Amer: 36 mL/min — ABNORMAL LOW (ref >60)
GFR calc non Af Amer: 31 mL/min — ABNORMAL LOW (ref >60)
Glucose, Bld: 96 mg/dL (ref 70–99)
POTASSIUM: 3.7 mmol/L (ref 3.5–5.1)
Sodium: 145 mmol/L (ref 135–145)

## 2018-11-17 LAB — CBC
HCT: 26.6 % — ABNORMAL LOW (ref 36.0–46.0)
Hemoglobin: 8.1 g/dL — ABNORMAL LOW (ref 12.0–15.0)
MCH: 30.1 pg (ref 26.0–34.0)
MCHC: 30.5 g/dL (ref 30.0–36.0)
MCV: 98.9 fL (ref 80.0–100.0)
NRBC: 1 % — AB (ref 0.0–0.2)
Platelets: 195 10*3/uL (ref 150–400)
RBC: 2.69 MIL/uL — ABNORMAL LOW (ref 3.87–5.11)
RDW: 20.6 % — ABNORMAL HIGH (ref 11.5–15.5)
WBC: 16.8 10*3/uL — ABNORMAL HIGH (ref 4.0–10.5)

## 2018-11-17 LAB — CALCIUM, IONIZED: Calcium, Ionized, Serum: 4.4 mg/dL — ABNORMAL LOW (ref 4.5–5.6)

## 2018-11-17 LAB — GLUCOSE, CAPILLARY
GLUCOSE-CAPILLARY: 93 mg/dL (ref 70–99)
Glucose-Capillary: 129 mg/dL — ABNORMAL HIGH (ref 70–99)
Glucose-Capillary: 88 mg/dL (ref 70–99)
Glucose-Capillary: 93 mg/dL (ref 70–99)
Glucose-Capillary: 96 mg/dL (ref 70–99)
Glucose-Capillary: 97 mg/dL (ref 70–99)

## 2018-11-17 LAB — PHOSPHORUS: PHOSPHORUS: 5.2 mg/dL — AB (ref 2.5–4.6)

## 2018-11-17 LAB — OCCULT BLOOD X 1 CARD TO LAB, STOOL: Fecal Occult Bld: POSITIVE — AB

## 2018-11-17 LAB — HEPARIN LEVEL (UNFRACTIONATED): HEPARIN UNFRACTIONATED: 0.63 [IU]/mL (ref 0.30–0.70)

## 2018-11-17 LAB — MAGNESIUM: Magnesium: 2.4 mg/dL (ref 1.7–2.4)

## 2018-11-17 MED ORDER — PYRIDOSTIGMINE BROMIDE 60 MG/5ML PO SOLN
30.0000 mg | Freq: Two times a day (BID) | ORAL | Status: DC
  Administered 2018-11-17 – 2018-11-19 (×4): 30 mg via ORAL
  Filled 2018-11-17 (×6): qty 2.5

## 2018-11-17 MED ORDER — OSMOLITE 1.2 CAL PO LIQD
1000.0000 mL | ORAL | Status: DC
  Administered 2018-11-17 – 2018-11-24 (×7): 1000 mL
  Filled 2018-11-17 (×15): qty 1000

## 2018-11-17 MED ORDER — PRO-STAT SUGAR FREE PO LIQD
30.0000 mL | Freq: Every day | ORAL | Status: DC
  Administered 2018-11-17 – 2018-11-26 (×10): 30 mL
  Filled 2018-11-17 (×9): qty 30

## 2018-11-17 MED ORDER — KETOROLAC TROMETHAMINE 15 MG/ML IJ SOLN
15.0000 mg | Freq: Once | INTRAMUSCULAR | Status: AC
  Administered 2018-11-17: 15 mg via INTRAVENOUS
  Filled 2018-11-17: qty 1

## 2018-11-17 MED ORDER — METOPROLOL TARTRATE 12.5 MG HALF TABLET
12.5000 mg | ORAL_TABLET | Freq: Once | ORAL | Status: AC
  Administered 2018-11-17: 12.5 mg via ORAL
  Filled 2018-11-17: qty 1

## 2018-11-17 MED ORDER — TRAMADOL HCL 50 MG PO TABS
50.0000 mg | ORAL_TABLET | Freq: Once | ORAL | Status: AC
  Administered 2018-11-17: 50 mg via ORAL
  Filled 2018-11-17: qty 1

## 2018-11-17 MED ORDER — AMIODARONE HCL 200 MG PO TABS
200.0000 mg | ORAL_TABLET | Freq: Every day | ORAL | Status: DC
  Administered 2018-11-17 – 2018-11-25 (×9): 200 mg
  Filled 2018-11-17 (×9): qty 1

## 2018-11-17 MED ORDER — METOPROLOL TARTRATE 12.5 MG HALF TABLET
12.5000 mg | ORAL_TABLET | Freq: Two times a day (BID) | ORAL | Status: DC
  Administered 2018-11-17 – 2018-11-26 (×17): 12.5 mg
  Filled 2018-11-17 (×19): qty 1

## 2018-11-17 NOTE — Progress Notes (Addendum)
Nutrition Follow-up  DOCUMENTATION CODES:   Non-severe (moderate) malnutrition in context of chronic illness  INTERVENTION:   Osmolite 1.2 @ 60 ml/hr (1440 ml) via Cortrak  30 ml Prostat once daily   Provides: 1828 kcals, 95 grams protein, 1182 ml free water. Meets 100% of needs.   NUTRITION DIAGNOSIS:   Moderate Malnutrition related to chronic illness as evidenced by moderate muscle depletion, moderate fat depletion.  Ongoing  GOAL:   Patient will meet greater than or equal to 90% of their needs  Not meeting- addressed with TF  MONITOR:   PO intake, Supplement acceptance, Diet advancement, TF tolerance  REASON FOR ASSESSMENT:   Ventilator   ASSESSMENT:   73 y/o female PMHx Multiple Myeloma, currently on chemotherapy, ckd3, FTT, Afib, CAD. Had recently been admitted 1/3-1/14 for infectious colitis. Now presents via EMS after suffering 2x cardiac arrest, first w/ ROSC 2 min and second ROSC 10 min. Intubated in field and begun on hypothermia protocol on arrival   1/27- extubated   Pressors on hold. Functional status remains the same. Plan to transfer to telemetry. SLP evaluated pt yesterday. She will likely need further testing once mentation clears. Remains NPO. Gastric Cortrak placed this am.   Right leg edema persists. Weight noted to decrease from 73.4 kg on 1/25 to 71.6 kg today. Pt continues to diurese. Will use 71.6 kg to estimate needs.  No BM charted since admit. No bowel regimen in place. Will discuss with nursing.   Awaiting PMT consult for goals of care discussion.   I/O: +9, 038 ml since admit UOP: 850 ml x 24 hrs   Medications reviewed and include: SSI, NS @ 30 ml/hr, 40 mg lasix once daily Labs reviewed: Phosphorus 5.2 (H)   Diet Order:   Diet Order            Diet NPO time specified Except for: Ice Chips  Diet effective now              EDUCATION NEEDS:   No education needs have been identified at this time  Skin:  Skin Assessment:  Reviewed RN Assessment  Last BM:  PTA  Height:   Ht Readings from Last 1 Encounters:  11/12/18 '5\' 5"'  (1.651 m)    Weight:   Wt Readings from Last 1 Encounters:  11/17/18 71.6 kg    Ideal Body Weight:  56.82 kg  BMI:  Body mass index is 26.27 kg/m.  Estimated Nutritional Needs:   Kcal:  1700-1900 kcal  Protein:  85-100 grams  Fluid:  >/= 1.7 L/day   Mariana Single RD, LDN Clinical Nutrition Pager # - [phone]

## 2018-11-17 NOTE — Procedures (Signed)
Cortrak  Tube Type:  Cortrak - 43 inches Tube Location:  Left nare Initial Placement:  Stomach Secured by: Bridle Technique Used to Measure Tube Placement:  Documented cm marking at nare/ corner of mouth Cortrak Secured At:  72 cm    Cortrak Tube Team Note:  Consult received to place a Cortrak feeding tube.   No x-ray is required. RN may begin using tube.    If the tube becomes dislodged please keep the tube and contact the Cortrak team at [email or web address] ([email or web address]) for replacement.  If after hours and replacement cannot be delayed, place a NG tube and confirm placement with an abdominal x-ray.   Koleen Distance MS, RD, LDN Pager #- [phone] Office#- [phone] After Hours Pager: [phone]

## 2018-11-17 NOTE — Progress Notes (Signed)
PROGRESS NOTE    Abigail [name]  UTM:[id number] DOB: 04/04/1946 DOA: 11/12/2018 PCP: Nolene Ebbs, MD    Brief Narrative:  73 year old woman with history of multiple myeloma, failure to thrive with recent hospital admission for infectious colitis and septic shock who was admitted 1/23 after VT/VF arrest with overall time to ROSC estimated at over 12 minutes. Hypothermia protocol was initiated. The arrest happened while she was on the phone and suddenly stopped talking, began to look offbut appeared to be breathing. EMS was called and on arrival she was found to be in a shockable rhythm. She was defibrillated three times and CPR was initiated, they obtained ROSC in about 2 minutes. She was intubated by EMS. She suffered cardiac arrest prior to arrival and had ROSC in about 10 minutes. On arrival to the ED she was posturing with a right sided gaze preference.  She was intubated.  Now extubated and transferred to the hospitalist service.  Patient at this time made DNR.  Significant Hospital Events  1/24 admit post VT/VF arrest, shocked 3x and intubated in the field with approximately 12 minutes of cardiac arrest prior to achieving ROSC.   Assessment & Plan:   Active Problems:   Encounter for central line placement   Cardiac arrest with ventricular fibrillation ([place])   VT/ VF cardiac arrest  S/p 12 minutes of resuscitation with 3x shock, cooling was initiated on arrival. Troponin peaked at 0/94.  -Was on IV amiodarone but currently on hold because of low heart rate. -Cardiology following.  Appreciate help.  Further management by cardiology.  Hypokalemia  Resolved   RLE DVT  - heparin gtt. patient currently has failure to thrive.  Decreased oral intake.  Awaiting core track placement.  Hypocalcemia  Hypomagnesemia - resolved  Hypophosphatemia - resolved   History of atrial fibrillation  Coronary artery disease  Mitral valve regurgitation  HTN Amiodarone on hold due to erratic  heart rate with bradycardia. Continue tele monitoring  -Cardiology following.  Acute on chronic renal failure post cardiac arrest  CKD Stage III  Volume overload  Acute hypoxic respiratory failure  Diuresed with IV lasix 80 mg, had 1.4 L urine output. CXR yesterday showing minimal bibasilar subsegmental atelectasis without any significant pleural effusion.    - On oxygen by nasal canula.  Multiple Myeloma with diffuse lytic bone lesions  Most recent zometa infusion 10/08/2017, chemotherapy currently on hold.   History of myasthenia gravis  - on home pyridostigmine  Leukocytosis -WBC count fluctuating.  Likely reactive?  No evidence of infectious etiology at this time.  Continue to monitor.   DVT prophylaxis: IV heparin Code Status: DNR Family Communication: No family at bedside Disposition Plan: Unknown at this time, patient with multiple complex medical problems, overall poor prognosis, consult palliative care for goals of care  Consultants:   Cardiology   Procedures:   No  Antimicrobials:  None    Subjective: Frail-appearing female, opens eyes when name is called but nonverbal and not following commands at this time.  Objective: Vitals:   11/17/18 0700 11/17/18 0733 11/17/18 0800 11/17/18 0900  BP: (!) 158/93  (!) 146/80 (!) 165/79  Pulse: 66  63 65  Resp: _0 Temp:  (!) 97.5 F (36.4 C)    TempSrc:  Oral    SpO2: 99%  100% 100%  Weight:      Height:        Intake/Output Summary (Last 24 hours) at 11/17/2018 1048 Last data  filed at 11/17/2018 0800 Gross per 24 hour  Intake 473.13 ml  Output 850 ml  Net -376.87 ml   Filed Weights   11/13/18 1936 11/16/18 0500 11/17/18 0530  Weight: 73.4 kg 73.4 kg 71.6 kg    Examination:  General exam: Frail, ill-appearing female, nonverbal, not following commands Respiratory system: Clear to auscultation anteriorly.  Cardiovascular system: S1 & S2 heard. No JVD, murmurs, rubs or clicks.  Right lower  extremity edema Gastrointestinal system: Abdomen is nondistended, soft and nontender. Normal bowel sounds heard. Central nervous system: Oriented x1 only, not following any commands therefore unable to do a neurologic exam Extremities: Not following any commands at this time.  Appears to have generalized weakness.  Right lower extremity edema. Skin: No rashes, lesions or ulcers    Data Reviewed: I have personally reviewed following labs and imaging studies  CBC: Recent Labs  Lab 11/13/18 0150 11/13/18 0453 11/14/18 0400 11/15/18 0559 11/16/18 0443 11/17/18 0529  WBC 15.5*  --  16.5* 16.0* 19.3* 16.8*  NEUTROABS  --   --  13.5*  --   --   --   HGB 9.1* 9.5* 9.4* 8.7* 8.4* 8.1*  HCT 29.3* 28.0* 31.2* 29.0* 27.6* 26.6*  MCV 95.1  --  100.3* 100.0 98.9 98.9  PLT 262  --  226 201 190 195   Basic Metabolic Panel: Recent Labs  Lab 11/14/18 0844 11/15/18 0559 11/15/18 0931 11/15/18 1608 11/16/18 0443 11/16/18 1700 11/17/18 0529  NA 144 145  --  142 143  --  145  K 3.7 3.2*  --  4.4 4.1  --  3.7  CL 118* 121*  --  118* 116*  --  119*  CO2 16* 15*  --  14* 15*  --  15*  GLUCOSE 109* 104*  --  112* 116*  --  96  BUN 5* 11  --  11 12  --  18  CREATININE 1.34* 1.66*  --  1.64* 1.61*  --  1.64*  CALCIUM 6.7* 7.0*  --  7.3* 8.2*  --  9.2  MG  --  1.7  --  2.1 2.0 1.8 2.4  PHOS  --   --  4.3 4.2 4.5 4.7* 5.2*   GFR: Estimated Creatinine Clearance: 31.2 mL/min (A) (by C-G formula based on SCr of 1.64 mg/dL (H)). Liver Function Tests: Recent Labs  Lab 11/12/18 1200 11/12/18 1905 11/14/18 0400 11/15/18 0931  AST 140* 150* 50* 31  ALT 68* 66* 38 31  ALKPHOS 68 66 66 83  BILITOT 1.5* 1.3* 0.9 0.8  PROT 7.1 6.6 5.2* 5.3*  ALBUMIN 3.6 3.2* 2.4* 2.3*   No results for input(s): LIPASE, AMYLASE in the last 168 hours. No results for input(s): AMMONIA in the last 168 hours. Coagulation Profile: Recent Labs  Lab 11/12/18 1630 11/13/18 0617  INR 1.15 1.17   Cardiac  Enzymes: Recent Labs  Lab 11/12/18 1630 11/12/18 1900 11/13/18 0136 11/13/18 0557  TROPONINI 0.65* 0.80* 0.92* 0.94*   BNP (last 3 results) No results for input(s): PROBNP in the last 8760 hours. HbA1C: No results for input(s): HGBA1C in the last 72 hours. CBG: Recent Labs  Lab 11/16/18 1540 11/16/18 2017 11/16/18 2341 11/17/18 0317 11/17/18 0734  GLUCAP 109* 103* 95 93 93   Lipid Profile: No results for input(s): CHOL, HDL, LDLCALC, TRIG, CHOLHDL, LDLDIRECT in the last 72 hours. Thyroid Function Tests: No results for input(s): TSH, T4TOTAL, FREET4, T3FREE, THYROIDAB in the last 72 hours. Anemia Panel: No   results for input(s): VITAMINB12, FOLATE, FERRITIN, TIBC, IRON, RETICCTPCT in the last 72 hours. Sepsis Labs: No results for input(s): PROCALCITON, LATICACIDVEN in the last 168 hours.  Recent Results (from the past 240 hour(s))  MRSA PCR Screening     Status: None   Collection Time: 11/12/18  8:51 PM  Result Value Ref Range Status   MRSA by PCR NEGATIVE NEGATIVE Final    Comment:        The GeneXpert MRSA Assay (FDA approved for NASAL specimens only), is one component of a comprehensive MRSA colonization surveillance program. It is not intended to diagnose MRSA infection nor to guide or monitor treatment for MRSA infections. Performed at [facility], [address] [place], [place] [address]          Radiology Studies: Dg Chest Port 1 View  Result Date: 11/16/2018 CLINICAL DATA:  Respiratory failure. EXAM: PORTABLE CHEST 1 VIEW COMPARISON:  Radiograph of November 15, 2018. FINDINGS: Stable cardiomegaly. Atherosclerosis of thoracic aorta is noted. Endotracheal and nasogastric tubes have been removed. Right internal jugular catheter is unchanged in position. No pneumothorax is noted. Minimal bibasilar subsegmental atelectasis is noted. No significant pleural effusion is noted. Bony thorax is unremarkable. IMPRESSION: Endotracheal and nasogastric tubes  have been removed. Right internal jugular catheter is unchanged in position. Minimal bibasilar subsegmental atelectasis. Electronically Signed   By: James  Green Jr, M.D.   On: 11/16/2018 09:21   Vas Us Lower Extremity Venous (dvt)  Result Date: 11/16/2018  Lower Venous Study Other Indications: Status post arrest. Limitations: Body habitus and edema. Performing Technologist: Candace Kanady RVS  Examination Guidelines: A complete evaluation includes B-mode imaging, spectral Doppler, color Doppler, and power Doppler as needed of all accessible portions of each vessel. Bilateral testing is considered an integral part of a complete examination. Limited examinations for reoccurring indications may be performed as noted.  Right Venous Findings: +---------+---------------+---------+-----------+----------+--------------+          CompressibilityPhasicitySpontaneityPropertiesSummary        +---------+---------------+---------+-----------+----------+--------------+ CFV      Full           Yes      Yes                                 +---------+---------------+---------+-----------+----------+--------------+ SFJ      Full                                                        +---------+---------------+---------+-----------+----------+--------------+ FV Prox  None                                         Acute          +---------+---------------+---------+-----------+----------+--------------+ FV Mid   None                                         Acute          +---------+---------------+---------+-----------+----------+--------------+ FV DistalNone                                           Acute          +---------+---------------+---------+-----------+----------+--------------+ PFV      Full                                         Acute          +---------+---------------+---------+-----------+----------+--------------+ POP      None                                          Acute          +---------+---------------+---------+-----------+----------+--------------+ PTV                                                   Not visualized +---------+---------------+---------+-----------+----------+--------------+ PERO                                                  Not visualized +---------+---------------+---------+-----------+----------+--------------+  Right Technical Findings: Not visualized segments include posterior tibial and peroneal veins.  Left Venous Findings: +-------+---------------+---------+-----------+----------+-------+        CompressibilityPhasicitySpontaneityPropertiesSummary +-------+---------------+---------+-----------+----------+-------+ CFV    Full           Yes      Yes                          +-------+---------------+---------+-----------+----------+-------+ SFJ    Full                                                 +-------+---------------+---------+-----------+----------+-------+ FV ProxFull                                                 +-------+---------------+---------+-----------+----------+-------+ PFV    Full                                                 +-------+---------------+---------+-----------+----------+-------+    Summary: Right: Findings consistent with acute deep vein thrombosis involving the right femoral vein, and right popliteal vein. Left: No evidence of common femoral vein obstruction.  *See table(s) above for measurements and observations. Electronically signed by Charles Fields MD on 11/16/2018 at 11:50:49 AM.    Final         Scheduled Meds: . atorvastatin  40 mg Oral q1800  . chlorhexidine  15 mL Mouth Rinse BID  . Chlorhexidine Gluconate Cloth  6 each Topical Daily  . fentaNYL (SUBLIMAZE) injection  50 mcg Intravenous Once  . insulin aspart  0-9 Units Subcutaneous Q4H  . mouth rinse  15 mL Mouth Rinse q12n4p  . pantoprazole (PROTONIX) IV  40 mg Intravenous Q12H  .  pyridostigmine    1 mg Intramuscular Q12H  . sodium chloride flush  10-40 mL Intracatheter Q12H   Continuous Infusions: . sodium chloride 30 mL/hr at 11/17/18 0800  . amiodarone Stopped (11/16/18 1444)  . fentaNYL infusion INTRAVENOUS Stopped (11/15/18 1345)  . heparin 550 Units/hr (11/17/18 0800)  . norepinephrine (LEVOPHED) Adult infusion Stopped (11/15/18 0742)     LOS: 5 days    Anupama Matcha, MD [facility] Pager on amion  If 7PM-7AM, please contact night-coverage [email or web address] Password [email or web address] 11/17/2018, 10:48 AM  

## 2018-11-17 NOTE — Progress Notes (Signed)
[facility]  Patient Status: Active (receiving services up to time of hospitalization)  [facility] is providing the following services: RN, PT, OT and MSW  If patient discharges after hours, please call [phone].   Abigail [name] 11/17/2018, 10:19 AM

## 2018-11-17 NOTE — Progress Notes (Signed)
Late entry:1630 Dr. Doylene Canard assessed pt at bedside to determine if amnio drip needed to be restarted d/t elevated/erratic heart rate, he placed orders for PO metoprolol and amnio, will continue to monitor

## 2018-11-17 NOTE — Progress Notes (Signed)
ANTICOAGULATION CONSULT NOTE - Follow Up Consult  Pharmacy Consult for Heparin Indication: DVT  Allergies  Allergen Reactions  . Sulfa Antibiotics Rash    Patient Measurements: Height: 5\' 5"  (165.1 cm) Weight: 157 lb 13.6 oz (71.6 kg) IBW/kg (Calculated) : 57 Heparin Dosing Weight: 72 kg  Vital Signs: Temp: 98.1 F (36.7 C) (01/29 1156) Temp Source: Oral (01/29 1156) BP: 171/142 (01/29 1300) Pulse Rate: 67 (01/29 1300)  Labs: Recent Labs    11/15/18 0559 11/15/18 1608  11/16/18 0443  11/16/18 1221 11/16/18 1518 11/17/18 0315 11/17/18 0529  HGB 8.7*  --   --  8.4*  --   --   --   --  8.1*  HCT 29.0*  --   --  27.6*  --   --   --   --  26.6*  PLT 201  --   --  190  --   --   --   --  195  HEPARINUNFRC  --   --    < >  --    < > 1.09* 0.42 0.63  --   CREATININE 1.66* 1.64*  --  1.61*  --   --   --   --  1.64*   < > = values in this interval not displayed.    Estimated Creatinine Clearance: 31.2 mL/min (A) (by C-G formula based on SCr of 1.64 mg/dL (H)).  Assessment:  73 yo female on IV heparin for DVT. Heparin drip 550 uts/hr HL 0.63 at goal.  No overt bleeding or complications noted.    Goal of Therapy:  Heparin level 0.3-0.7 units/ml Monitor platelets by anticoagulation protocol: Yes   Plan:   heparin drip at 550 units/hr  Daily heparin level and CBC.  Bonnita Nasuti Pharm.D. CPP, BCPS Clinical Pharmacist [phone] 11/17/2018 3:15 PM

## 2018-11-17 NOTE — Consult Note (Signed)
Ref: Nolene Ebbs, MD   Subjective:  Resting comfortably. VS stable. Heart rate 50-60 bpm. Monitor sinus rhythm with APCs. Mild drop in Hgb. Palliative care recommended. Awaiting Cortrak feeding tube placement.  Objective:  Vital Signs in the last 24 hours: Temp:  [97.5 F (36.4 C)-97.9 F (36.6 C)] 97.5 F (36.4 C) (01/29 0733) Pulse Rate:  [43-149] 65 (01/29 0900) Cardiac Rhythm: Atrial fibrillation;Other (Comment) (01/29 0800) Resp:  [13-28] 18 (01/29 0900) BP: (97-165)/(65-117) 165/79 (01/29 0900) SpO2:  [93 %-100 %] 100 % (01/29 0900) Weight:  [71.6 kg] 71.6 kg (01/29 0530)  Physical Exam: BP Readings from Last 1 Encounters:  11/17/18 (!) 165/79     Wt Readings from Last 1 Encounters:  11/17/18 71.6 kg    Weight change: -1.8 kg Body mass index is 26.27 kg/m. HEENT: [place]/AT, Eyes-Brown, Conjunctiva-Pink, Sclera-Non-icteric Neck: No JVD, No bruit, Trachea midline. Lungs:  Clearing, Bilateral. Cardiac:  Regular rhythm, normal S1 and S2, no S3. II/VI systolic murmur. Abdomen:  Soft, non-tender. BS present. Extremities:  No edema present. No cyanosis. No clubbing. Right lower leg edema is decreasing. CNS: AxOx2, Cranial nerves grossly intact, moves all 4 extremities.  Skin: Warm and dry.   Intake/Output from previous day: 01/28 0701 - 01/29 0700 In: 551.1 [I.V.:501.1; IV Piggyback:50] Out: 850 [Urine:850]    Lab Results: BMET    Component Value Date/Time   NA 145 11/17/2018 0529   NA 143 11/16/2018 0443   NA 142 11/15/2018 1608   NA 144 09/17/2017 1429   NA 141 07/30/2017 1504   NA 142 06/11/2017 1451   K 3.7 11/17/2018 0529   K 4.1 11/16/2018 0443   K 4.4 11/15/2018 1608   K 3.1 (L) 09/17/2017 1429   K 4.8 07/30/2017 1504   K 4.3 06/11/2017 1451   CL 119 (H) 11/17/2018 0529   CL 116 (H) 11/16/2018 0443   CL 118 (H) 11/15/2018 1608   CO2 15 (L) 11/17/2018 0529   CO2 15 (L) 11/16/2018 0443   CO2 14 (L) 11/15/2018 1608   CO2 23 09/17/2017 1429   CO2 20  (L) 07/30/2017 1504   CO2 20 (L) 06/11/2017 1451   GLUCOSE 96 11/17/2018 0529   GLUCOSE 116 (H) 11/16/2018 0443   GLUCOSE 112 (H) 11/15/2018 1608   GLUCOSE 96 09/17/2017 1429   GLUCOSE 79 07/30/2017 1504   GLUCOSE 74 06/11/2017 1451   BUN 18 11/17/2018 0529   BUN 12 11/16/2018 0443   BUN 11 11/15/2018 1608   BUN 15.6 09/17/2017 1429   BUN 24.4 07/30/2017 1504   BUN 18.7 06/11/2017 1451   CREATININE 1.64 (H) 11/17/2018 0529   CREATININE 1.61 (H) 11/16/2018 0443   CREATININE 1.64 (H) 11/15/2018 1608   CREATININE 1.97 (H) 10/21/2018 1055   CREATININE 1.36 (H) 10/07/2018 0823   CREATININE 1.41 (H) 09/30/2018 1005   CREATININE 1.0 09/17/2017 1429   CREATININE 1.2 (H) 07/30/2017 1504   CREATININE 1.2 (H) 06/11/2017 1451   CALCIUM 9.2 11/17/2018 0529   CALCIUM 8.2 (L) 11/16/2018 0443   CALCIUM 7.3 (L) 11/15/2018 1608   CALCIUM 8.6 09/17/2017 1429   CALCIUM 9.1 07/30/2017 1504   CALCIUM 9.6 06/11/2017 1451   GFRNONAA 31 (L) 11/17/2018 0529   GFRNONAA 32 (L) 11/16/2018 0443   GFRNONAA 31 (L) 11/15/2018 1608   GFRNONAA 25 (L) 10/21/2018 1055   GFRNONAA 39 (L) 10/07/2018 0823   GFRNONAA 37 (L) 09/30/2018 1005   GFRAA 36 (L) 11/17/2018 0529   GFRAA 37 (L)  11/16/2018 0443   GFRAA 36 (L) 11/15/2018 1608   GFRAA 29 (L) 10/21/2018 1055   GFRAA 45 (L) 10/07/2018 0823   GFRAA 43 (L) 09/30/2018 1005   CBC    Component Value Date/Time   WBC 16.8 (H) 11/17/2018 0529   RBC 2.69 (L) 11/17/2018 0529   HGB 8.1 (L) 11/17/2018 0529   HGB 9.1 (L) 10/21/2018 1055   HGB 11.3 (L) 09/17/2017 1428   HCT 26.6 (L) 11/17/2018 0529   HCT 35.8 09/17/2017 1428   PLT 195 11/17/2018 0529   PLT 271 10/21/2018 1055   PLT 231 09/17/2017 1428   MCV 98.9 11/17/2018 0529   MCV 93.2 09/17/2017 1428   MCH 30.1 11/17/2018 0529   MCHC 30.5 11/17/2018 0529   RDW 20.6 (H) 11/17/2018 0529   RDW 16.5 (H) 09/17/2017 1428   LYMPHSABS 0.6 (L) 11/14/2018 0400   LYMPHSABS 0.7 (L) 09/17/2017 1428   MONOABS 1.8 (H)  11/14/2018 0400   MONOABS 0.5 09/17/2017 1428   EOSABS 0.0 11/14/2018 0400   EOSABS 0.1 09/17/2017 1428   BASOSABS 0.1 11/14/2018 0400   BASOSABS 0.0 09/17/2017 1428   HEPATIC Function Panel Recent Labs    11/12/18 1905 11/14/18 0400 11/15/18 0931  PROT 6.6 5.2* 5.3*   HEMOGLOBIN A1C No components found for: HGA1C,  MPG CARDIAC ENZYMES Lab Results  Component Value Date   CKTOTAL 52 08/21/2017   CKMB 1.3 08/21/2017   TROPONINI 0.94 (HH) 11/13/2018   TROPONINI 0.92 (HH) 11/13/2018   TROPONINI 0.80 (HH) 11/12/2018   BNP No results for input(s): PROBNP in the last 8760 hours. TSH Recent Labs    12/13/17 2243 10/02/18 1825  TSH 1.922 0.751   CHOLESTEROL Recent Labs    12/14/17 0457 09/02/18 0152 10/03/18 0637  CHOL 167 143 136    Scheduled Meds: . atorvastatin  40 mg Oral q1800  . chlorhexidine  15 mL Mouth Rinse BID  . Chlorhexidine Gluconate Cloth  6 each Topical Daily  . fentaNYL (SUBLIMAZE) injection  50 mcg Intravenous Once  . insulin aspart  0-9 Units Subcutaneous Q4H  . mouth rinse  15 mL Mouth Rinse q12n4p  . pantoprazole (PROTONIX) IV  40 mg Intravenous Q12H  . pyridostigmine  1 mg Intramuscular Q12H  . sodium chloride flush  10-40 mL Intracatheter Q12H   Continuous Infusions: . sodium chloride 30 mL/hr at 11/17/18 0800  . amiodarone Stopped (11/16/18 1444)  . fentaNYL infusion INTRAVENOUS Stopped (11/15/18 1345)  . heparin 550 Units/hr (11/17/18 0800)  . norepinephrine (LEVOPHED) Adult infusion Stopped (11/15/18 0742)   PRN Meds:.hydrALAZINE, sodium chloride flush  Assessment/Plan: V. Fib cardiac arrest Acute respiratory failure with hypoxemia, resolved Possible aspiration pneumonia, less likely Acute systolic heart failure Acute right leg DVT Multiple myeloma Suspected PE CAD S/P stents in LAD and LCx Anemia of chronic disease Anemia recurrent GI bleed Acute renal failure Depression Hypokalemia resolved Hypocalcemia,  resolved   Amiodarone drip on hold. Slow and steady recovery. Awaiting feeding tube placement. Transfer to Telemetry bed.   LOS: 5 days    [name]  MD  11/17/2018, 9:52 AM

## 2018-11-17 NOTE — Progress Notes (Signed)
  Speech Language Pathology Treatment: Dysphagia  Patient Details Name: Abigail [name] MRN: [id number] DOB: 1946/09/13 Today's Date: 11/17/2018 Time: 6568-1275 SLP Time Calculation (min) (ACUTE ONLY): 14 min  Assessment / Plan / Recommendation Clinical Impression  Pt has less of a delay in response time today, but actually has increased oral holding, requiring Mod cues to swallow. She had a single episode of anterior loss, seemingly due to decreased awareness during bolus presentation. Weak coughing that immediately follows thin liquids is concerning for aspiration. Multiple swallows and delayed coughing is also concerning for decreased swallow efficiency and/or safety. Suspect that pt will likely ned instrumental swallow testing, but ideally would like her to have less signs of aspiration prior to testing, or at least improved mentation such that she clears her oral cavity more swiftly to start swallowing prior to proceeding. Will continue to follow for readiness. MD may wish to consider ordering SLP cognitive evaluation as well.    HPI HPI: Pt is a 73 yo female admitted post VT/VF arrest x2, shocked 3x and intubated in the field with approximately 12 minutes of cardiac arrest prior to achieving ROSC.  Upon arrival to the ED she was posturing with a R sided gaze preference. ETT 1/24-1/27. PMH: GERD, schatzki's ring, multiple myeloma, FTT, recent hospitalization for infectious colitis and septic shock, myasthenia gravis, afib, CKD, MI, HTN, HLD, CAD      SLP Plan  Continue with current plan of care       Recommendations  Diet recommendations: NPO Medication Administration: Via alternative means                Oral Care Recommendations: Oral care QID Follow up Recommendations: (tba) SLP Visit Diagnosis: Dysphagia, unspecified (R13.10) Plan: Continue with current plan of care       GO                [name] Nix 11/17/2018, 10:00 AM  [name], M.A. [place] [facility]  Environmental education officer [phone] Office [phone]

## 2018-11-17 NOTE — Progress Notes (Signed)
Per Dr. Octavia Heir, he feels is acceptable to have a HR between 110-120's, if elevation persists after 7 PM he requests a page back for alternate medications

## 2018-11-17 NOTE — Consult Note (Signed)
Consultation Note Date: 11/17/2018   Patient Name: Abigail [name]  DOB: 1946-10-18  MRN: [id number]  Age / Sex: 73 y.o., female  PCP: Nolene Ebbs, MD Referring Physician: Yaakov Guthrie, MD  Reason for Consultation: Establishing goals of care  HPI/Patient Profile: 73 y.o. female  with past medical history of myasthenia gravis, a. Fib w/ RVR, prolonged QT, CKD III, multiple myeloma with diffuse lytic lesions- (chemo recently held due to poor performance status, weight loss), recent hospitalization d/t septic shock from infectious colitis (discharged home on 1/14- of note during that hospitalization patient stated she did not want aggressive or artificial life support measures per 1/3 note from Dr. Valeta Harms) admitted on 11/12/2018 with cardiac arrest x2 s/p CPR x 1 for 2 minutes and then x 10 minutes with intubation. She was noted to be posturing and had rightsided gaze preference in the ED. She is now extubated, however, mental status is slow to improve. ECHO showed 20-25% EF with diffuse hypokinesis. Palliative medicine consulted for [place].    Clinical Assessment and Goals of Care: I have reviewed patient's chart including notes, labs and imaging.  Evaluated patient- found her to be in bed, moaning. She opened her eyes only briefly when I spoke her name. She did not follow any commands.  Per nursing she did answer her name, and stated she was at "[facility]" this morning, in response to orientation questions. Patient unable to engage in [place] discussion.  Dr. Doylene Canard at bedside and lifted patient's arm and instructed her to hold it up, she held it up for maybe a second and then it fell.  Patient did not respond when I asked her if she was in pain.  Attempted to call patient's sister, left message requesting return call.   Primary Decision Maker NEXT OF KIN- sister- Abigail [name]    SUMMARY OF RECOMMENDATIONS -PMT  will continue to try and reach patient's sister- Abigail [name] for [place] discussion -Please note that per conversation with Dr. Valeta Harms per 10/22/18 note- patient stated she did not want aggressive or artificial life prolonging measures   -Would also recommend offering some form of pain management for patient- lytic bone lesions can be very painful  Code Status/Advance Care Planning:  DNR  Palliative Prophylaxis:   Aspiration, Delirium Protocol, Frequent Pain Assessment and Turn Reposition  Additional Recommendations (Limitations, Scope, Preferences):  Full Scope Treatment  Prognosis:    Unable to determine  Discharge Planning: To Be Determined    Primary Diagnoses: Present on Admission: **None**   I have reviewed the medical record, interviewed the patient and family, and examined the patient. The following aspects are pertinent.  Past Medical History:  Diagnosis Date  . ACS (acute coronary syndrome) ([place]) 06/2017  . Anemia    "off and on all my life" (12/22/2016)  . Anxiety   . Bilateral renal artery stenosis ([place]) 1999   s/p stenting  . Chronic kidney disease (CKD), stage III (moderate) ([place])    Archie Endo 12/22/2016  . Coronary artery disease 1999  .  Depression   . Diverticulosis   . Duodenitis   . DVT (deep venous thrombosis) ([facility])    on Coumadin  . Dyspnea   . Gastric erosions   . Gastric ulcer   . GERD (gastroesophageal reflux disease)   . Heart murmur   . History of blood transfusion ~ 03/2016   "low HgB; practically nonexistent"  . Hyperlipidemia   . Hypertension   . MI (myocardial infarction) ([place]) 1999  . Multiple myeloma ([facility]) dx'd ~ 04/2016  . PAT (paroxysmal atrial tachycardia) ([place]) 12/22/2016  . Retinal hemorrhage, right eye   . Schatzki's ring   . Tachy-brady syndrome ([place])    Archie Endo 12/22/2016  . Tubular adenoma of colon    Social History   Socioeconomic History  . Marital status: Single    Spouse name: Not on file  . Number of children: Not on  file  . Years of education: Not on file  . Highest education level: Not on file  Occupational History  . Not on file  Social Needs  . Financial resource strain: Not on file  . Food insecurity:    Worry: Not on file    Inability: Not on file  . Transportation needs:    Medical: Not on file    Non-medical: Not on file  Tobacco Use  . Smoking status: Current Every Day Smoker    Packs/day: 0.50    Years: 50.00    Pack years: 25.00    Types: Cigarettes  . Smokeless tobacco: Never Used  Substance and Sexual Activity  . Alcohol use: No    Alcohol/week: 0.0 standard drinks  . Drug use: No  . Sexual activity: Not Currently  Lifestyle  . Physical activity:    Days per week: Not on file    Minutes per session: Not on file  . Stress: Not on file  Relationships  . Social connections:    Talks on phone: Not on file    Gets together: Not on file    Attends religious service: Not on file    Active member of club or organization: Not on file    Attends meetings of clubs or organizations: Not on file    Relationship status: Not on file  Other Topics Concern  . Not on file  Social History Narrative  . Not on file   Family History  Problem Relation Age of Onset  . Breast cancer Mother   . Breast cancer Sister   . Breast cancer Maternal Aunt   . Colon cancer Neg Hx    Scheduled Meds: . atorvastatin  40 mg Oral q1800  . chlorhexidine  15 mL Mouth Rinse BID  . Chlorhexidine Gluconate Cloth  6 each Topical Daily  . feeding supplement (PRO-STAT SUGAR FREE 64)  30 mL Per Tube Daily  . fentaNYL (SUBLIMAZE) injection  50 mcg Intravenous Once  . insulin aspart  0-9 Units Subcutaneous Q4H  . mouth rinse  15 mL Mouth Rinse q12n4p  . pantoprazole (PROTONIX) IV  40 mg Intravenous Q12H  . pyridostigmine  30 mg Oral BID  . sodium chloride flush  10-40 mL Intracatheter Q12H   Continuous Infusions: . sodium chloride 30 mL/hr at 11/17/18 1200  . amiodarone Stopped (11/16/18 1444)  .  feeding supplement (OSMOLITE 1.2 CAL) 1,000 mL (11/17/18 1518)  . fentaNYL infusion INTRAVENOUS Stopped (11/15/18 1345)  . heparin 550 Units/hr (11/17/18 1200)  . norepinephrine (LEVOPHED) Adult infusion Stopped (11/15/18 0742)   PRN Meds:.hydrALAZINE, sodium chloride flush Medications  Prior to Admission:  Prior to Admission medications   Medication Sig Start Date End Date Taking? Authorizing Provider  acyclovir (ZOVIRAX) 400 MG tablet TAKE 1 TABLET (400 MG TOTAL) BY MOUTH 2 (TWO) TIMES DAILY. 08/29/16  Yes Ladell Pier, MD  amiodarone (PACERONE) 200 MG tablet Take 0.5 tablets (100 mg total) by mouth daily. 10/19/18  Yes [name], MD  atorvastatin (LIPITOR) 80 MG tablet Take 0.5 tablets (40 mg total) by mouth daily at 6 PM. 10/03/18  Yes Charolette Forward, MD  calcium carbonate (OS-CAL - DOSED IN MG OF ELEMENTAL CALCIUM) 1250 (500 Ca) MG tablet Take 1 tablet by mouth daily with breakfast.   Yes [provider]  cyclophosphamide (CYTOXAN) 50 MG capsule Take 10 capsules (52m) by mouth once weekly for 3 weeks on, 1 week off, repeat every 4 weeks. Take early in the day. Maintain hydration. 10/28/18  Yes SLadell Pier MD  dexamethasone (DECADRON) 4 MG tablet Take 5 tablets (28m weekly day of chemotherapy 10/21/18  Yes ShLadell PierMD  diltiazem (CARDIZEM) 30 MG tablet Take 1 tablet (30 mg total) by mouth every 12 (twelve) hours. 10/19/18  Yes KaDixie DialsMD  ferrous sulfate 325 (65 FE) MG tablet Take 1 tablet (325 mg total) by mouth daily with breakfast. 03/02/17  Yes KaDixie DialsMD  hydrALAZINE (APRESOLINE) 25 MG tablet Take 25 mg by mouth 3 (three) times daily. 10/10/18  Yes [provider]  nitroGLYCERIN (NITROSTAT) 0.4 MG SL tablet Place 0.4 mg under the tongue every 5 (five) minutes as needed for chest pain. 03/20/17  Yes [provider]  oxyCODONE-acetaminophen (PERCOCET/ROXICET) 5-325 MG tablet Take 1 tablet by mouth every 4 (four) hours as needed for  moderate pain or severe pain. 11/02/18  Yes ThEugenie FillerMD  pantoprazole (PROTONIX) 40 MG tablet Take 1 tablet (40 mg total) by mouth 2 (two) times daily before a meal. 09/17/18  Yes KaDixie DialsMD  Potassium Chloride ER (K-TAB) 20 MEQ TBCR Take 20 mEq by mouth daily. 10/21/18  Yes ShLadell PierMD  pyridostigmine (MESTINON) 60 MG tablet Take 0.5 tablets (30 mg total) by mouth 2 (two) times daily. 01/25/17  Yes KaDixie DialsMD  tiZANidine (ZANAFLEX) 4 MG tablet Take 4 mg by mouth at bedtime as needed for muscle spasms.  04/06/18  Yes [provider]  feeding supplement, ENSURE ENLIVE, (ENSURE ENLIVE) LIQD Take 237 mLs by mouth 2 (two) times daily between meals. Patient taking differently: Take 237 mLs by mouth See admin instructions. Drink one bottle (237 ml) by mouth one or two times daily as a meal supplement 09/17/18   KaDixie DialsMD  ISOSORBIDE DINITRATE PO Take by mouth.  12/30/11  [provider]   Allergies  Allergen Reactions  . Sulfa Antibiotics Rash   Review of Systems  Unable to perform ROS: Patient nonverbal    Physical Exam Vitals signs and nursing note reviewed.  Constitutional:      Appearance: She is ill-appearing.     Comments: frail  Cardiovascular:     Rate and Rhythm: Normal rate.     Pulses: Normal pulses.  Pulmonary:     Effort: Pulmonary effort is normal.  Neurological:     Motor: Weakness present.     Comments: Not following commands, nonverbal     Vital Signs: BP (!) 171/142   Pulse 67   Temp 98.1 F (36.7 C) (Oral)   Resp 17   Ht '5\' 5"'  (1.651 m)  Wt 71.6 kg   SpO2 100%   BMI 26.27 kg/m  Pain Scale: 0-10 POSS *See Group Information*: S-Acceptable,Sleep, easy to arouse Pain Score: 0-No pain   SpO2: SpO2: 100 % O2 Device:SpO2: 100 % O2 Flow Rate: .O2 Flow Rate (L/min): 2 L/min  IO: Intake/output summary:   Intake/Output Summary (Last 24 hours) at 11/17/2018 1529 Last data filed at 11/17/2018 1200 Gross per 24  hour  Intake 398.79 ml  Output 900 ml  Net -501.21 ml    LBM: Last BM Date: (PTA) Baseline Weight: Weight: 74 kg Most recent weight: Weight: 71.6 kg     Palliative Assessment/Data: PPS: 10%     Thank you for this consult. Palliative medicine will continue to follow and assist as needed.   Time In: 2595 Time Out: 1600 Time Total: 75 minutes Greater than 50%  of this time was spent counseling and coordinating care related to the above assessment and plan.  Signed by: Mariana Kaufman, AGNP-C Palliative Medicine    Please contact Palliative Medicine Team phone at [phone] for questions and concerns.  For individual provider: See Shea Evans

## 2018-11-17 NOTE — Evaluation (Signed)
Occupational Therapy Evaluation Patient Details Name: Abigail [name] MRN: [id number] DOB: 17-Dec-1945 Today's Date: 11/17/2018    History of Present Illness Pt is a 73 y.o. F with significant PMH of CKD stage III, myasthenia gravis, multiple myeloma, failure to thrive with recent hospital admission for infectious colitis and septic shock who was admitted after VT/VF arrest with overall time to ROSC estimated at 12 minutes. Intubated 1/24-1/27. Also found to have RLE DVT.    Clinical Impression   Pt was living alone, ambulating with a RW intermittently and performing her own ADL. She presents with generalized weakness, requiring max to +2 total assist for bed level mobility and was unable to stand. She grimaced with movement of her LEs. She verbalized minimally and followed one step commands inconsistently. Pt sat at EOB with min guard to supervision x 10 minutes. She will need post acute rehab in SNF upon discharge. Will follow acutely.    Follow Up Recommendations  SNF;Supervision/Assistance - 24 hour    Equipment Recommendations       Recommendations for Other Services       Precautions / Restrictions Precautions Precautions: Fall Precaution Comments: cortrak, pain with leg movements Restrictions Weight Bearing Restrictions: No      Mobility Bed Mobility Overal bed mobility: Needs Assistance Bed Mobility: Rolling;Supine to Sit;Sit to Supine Rolling: Max assist   Supine to sit: Total assist;+2 for physical assistance Sit to supine: Total assist;+2 for physical assistance   General bed mobility comments: Max assist for rolling several times towards left and right for peri care and linen change. TotalA + 2 for supine <> sit with little initiation  Transfers Overall transfer level: Needs assistance Equipment used: None Transfers: Sit to/from Stand Sit to Stand: Total assist;+2 physical assistance         General transfer comment: x3 attempts to stand from edge of bed, pt able  to clear hips slightly but not achieve upright positioning    Balance Overall balance assessment: Needs assistance Sitting-balance support: Feet unsupported;Single extremity supported Sitting balance-Leahy Scale: Fair Sitting balance - Comments: able to progress to supervision - min guard for static sitting                                   ADL either performed or assessed with clinical judgement   ADL                                         General ADL Comments: pt currently requiring total assist     Vision   Additional Comments: vision appears intact     Perception     Praxis      Pertinent Vitals/Pain Pain Assessment: Faces Faces Pain Scale: Hurts whole lot Pain Location: grimacing with transitional movements and positioning of BLE's Pain Descriptors / Indicators: Grimacing Pain Intervention(s): Monitored during session;Repositioned     Hand Dominance Right   Extremity/Trunk Assessment Upper Extremity Assessment Upper Extremity Assessment: Generalized weakness   Lower Extremity Assessment Lower Extremity Assessment: Defer to PT evaluation       Communication Communication Communication: Expressive difficulties(very little attempt to verbalize)   Cognition Arousal/Alertness: Lethargic(increase in level of alertness at EOB) Behavior During Therapy: Flat affect Overall Cognitive Status: Impaired/Different from baseline Area of Impairment: Orientation;Attention;Following commands;Awareness  Orientation Level: Disoriented to;Time;Situation Current Attention Level: Focused   Following Commands: Follows one step commands inconsistently   Awareness: Intellectual   General Comments: Pt able to verbalize her name and answer, "I'm not," in response to "are you ready?" Otherwise, minimal verbalizations throughout and following simple commands inconsistently ~75% of the time   General Comments       Exercises      Shoulder Instructions      Home Living Family/patient expects to be discharged to:: Private residence Living Arrangements: Alone Available Help at Discharge: Family;Friend(s);Available PRN/intermittently Type of Home: Apartment Home Access: Stairs to enter Entrance Stairs-Number of Steps: 3 Entrance Stairs-Rails: Left Home Layout: One level     Bathroom Shower/Tub: Teacher, early years/pre: Standard     Home Equipment: Environmental consultant - 2 wheels;Cane - single point;Grab bars - tub/shower   Additional Comments: Home living obtained from previous inpatient stay      Prior Functioning/Environment Level of Independence: Independent with assistive device(s)        Comments: Mod indep with intermittent use of RW.         OT Problem List: Decreased strength;Decreased activity tolerance;Impaired balance (sitting and/or standing);Decreased coordination;Decreased cognition;Decreased knowledge of use of DME or AE;Pain      OT Treatment/Interventions: Self-care/ADL training;DME and/or AE instruction;Patient/family education;Balance training;Cognitive remediation/compensation;Neuromuscular education    OT Goals(Current goals can be found in the care plan section) Acute Rehab OT Goals Patient Stated Goal: unable to state OT Goal Formulation: Patient unable to participate in goal setting Time For Goal Achievement: 12/01/18 Potential to Achieve Goals: Fair ADL Goals Pt Will Perform Eating: with min assist;sitting(when cleared for PO) Pt Will Perform Grooming: with mod assist;sitting Pt Will Perform Upper Body Dressing: with mod assist;sitting Pt Will Transfer to Toilet: with +2 assist;with mod assist Pt/caregiver will Perform Home Exercise Program: Increased strength;Both right and left upper extremity;With Supervision(AROM) Additional ADL Goal #1: Pt will follow one step commands consistently. Additional ADL Goal #2: Pt will perform bed mobility with moderate assistance in  preparation for ADL.  OT Frequency: Min 2X/week   Barriers to D/C:            Co-evaluation PT/OT/SLP Co-Evaluation/Treatment: Yes Reason for Co-Treatment: Necessary to address cognition/behavior during functional activity;For patient/therapist safety PT goals addressed during session: Mobility/safety with mobility OT goals addressed during session: Strengthening/ROM      AM-PAC OT "6 Clicks" Daily Activity     Outcome Measure Help from another person eating meals?: Total Help from another person taking care of personal grooming?: Total Help from another person toileting, which includes using toliet, bedpan, or urinal?: Total Help from another person bathing (including washing, rinsing, drying)?: Total Help from another person to put on and taking off regular upper body clothing?: Total Help from another person to put on and taking off regular lower body clothing?: Total 6 Click Score: 6   End of Session Equipment Utilized During Treatment: Gait belt Nurse Communication: Mobility status  Activity Tolerance: Patient tolerated treatment well Patient left: in bed;with call bell/phone within reach  OT Visit Diagnosis: Pain;Muscle weakness (generalized) (M62.81);Other symptoms and signs involving cognitive function                Time: 1328-1403 OT Time Calculation (min): 35 min Charges:  OT General Charges $OT Visit: 1 Visit OT Evaluation $OT Eval Moderate Complexity: 1 Mod  Nestor Lewandowsky, OTR/L Acute Rehabilitation Services Pager: [phone] Office: [phone]  Malka So 11/17/2018, 3:52 PM

## 2018-11-17 NOTE — Evaluation (Signed)
Physical Therapy Evaluation Patient Details Name: Abigail [name] MRN: [id number] DOB: [birth year] Today's Date: 11/17/2018   History of Present Illness  Pt is a 73 y.o. F with significant PMH of CKD stage III, myasthenia gravis, multiple myeloma, failure to thrive with recent hospital admission for infectious colitis and septic shock who was admitted after VT/VF arrest with overall time to ROSC estimated at 12 minutes. Intubated 1/24-1/27. Also found to have RLE DVT.   Clinical Impression  Patient admitted with above diagnosis. Presenting with significantly decreased functional mobility secondary to debility, balance impairments, decreased activity tolerance, and cognitive deficits. Requiring two person max to total assistance for all aspects of bed mobility and transfers. Unable to stand at this time. BP sitting 157/109, supine 186/93, HR 60-70s. Will follow acutely to progress as tolerated.    Follow Up Recommendations SNF    Equipment Recommendations  None recommended by PT    Recommendations for Other Services       Precautions / Restrictions Precautions Precautions: Fall Precaution Comments: cortrak, pain with leg movements Restrictions Weight Bearing Restrictions: No      Mobility  Bed Mobility Overal bed mobility: Needs Assistance Bed Mobility: Rolling;Supine to Sit;Sit to Supine Rolling: Max assist   Supine to sit: Total assist;+2 for physical assistance Sit to supine: Total assist;+2 for physical assistance   General bed mobility comments: Max assist for rolling several times towards left and right for peri care and linen change. TotalA + 2 for supine <> sit with little initiation  Transfers Overall transfer level: Needs assistance Equipment used: None Transfers: Sit to/from Stand Sit to Stand: Total assist;+2 physical assistance         General transfer comment: x3 attempts to stand from edge of bed, pt able to clear hips slightly but not achieve upright  positioning  Ambulation/Gait                Stairs            Wheelchair Mobility    Modified Rankin (Stroke Patients Only)       Balance Overall balance assessment: Needs assistance Sitting-balance support: Feet unsupported;Single extremity supported Sitting balance-Leahy Scale: Fair Sitting balance - Comments: able to progress to supervision - min guard for static sitting                                     Pertinent Vitals/Pain Pain Assessment: Faces Faces Pain Scale: Hurts whole lot Pain Location: grimacing with transitional movements and positioning of BLE's Pain Descriptors / Indicators: Grimacing Pain Intervention(s): Limited activity within patient's tolerance;Monitored during session    Home Living Family/patient expects to be discharged to:: Private residence Living Arrangements: Alone Available Help at Discharge: Family;Friend(s);Available PRN/intermittently Type of Home: Apartment Home Access: Stairs to enter Entrance Stairs-Rails: Left Entrance Stairs-Number of Steps: 3 Home Layout: One level Home Equipment: Walker - 2 wheels;Cane - single point;Grab bars - tub/shower Additional Comments: Home living obtained from previous inpatient stay    Prior Function Level of Independence: Independent with assistive device(s)         Comments: Mod indep with intermittent use of RW.      Hand Dominance   Dominant Hand: Right    Extremity/Trunk Assessment   Upper Extremity Assessment Upper Extremity Assessment: Defer to OT evaluation    Lower Extremity Assessment Lower Extremity Assessment: Generalized weakness(increased edema)       Communication  Communication: No difficulties(minimal verbalizations)  Cognition Arousal/Alertness: Lethargic(More alert upon sitting EOB) Behavior During Therapy: Flat affect Overall Cognitive Status: Impaired/Different from baseline Area of Impairment: Orientation;Attention;Following  commands;Awareness                 Orientation Level: Disoriented to;Time;Situation Current Attention Level: Focused   Following Commands: Follows one step commands inconsistently   Awareness: Intellectual   General Comments: Pt able to verbalize her name and answer, "I'm not," in response to "are you ready?" Otherwise, minimal verbalizations throughout and following simple commands inconsistently ~75% of the time      General Comments      Exercises     Assessment/Plan    PT Assessment Patient needs continued PT services  PT Problem List Decreased strength;Decreased mobility;Decreased activity tolerance;Cardiopulmonary status limiting activity;Decreased balance;Decreased knowledge of use of DME       PT Treatment Interventions Functional mobility training;Patient/family education;Balance training;DME instruction;Therapeutic activities;Therapeutic exercise;Gait training    PT Goals (Current goals can be found in the Care Plan section)  Acute Rehab PT Goals Patient Stated Goal: unable to state PT Goal Formulation: Patient unable to participate in goal setting Time For Goal Achievement: 12/01/18 Potential to Achieve Goals: Fair    Frequency Min 2X/week   Barriers to discharge Decreased caregiver support      Co-evaluation PT/OT/SLP Co-Evaluation/Treatment: Yes Reason for Co-Treatment: Necessary to address cognition/behavior during functional activity;For patient/therapist safety;To address functional/ADL transfers;Complexity of the patient's impairments (multi-system involvement) PT goals addressed during session: Mobility/safety with mobility         AM-PAC PT "6 Clicks" Mobility  Outcome Measure Help needed turning from your back to your side while in a flat bed without using bedrails?: A Lot Help needed moving from lying on your back to sitting on the side of a flat bed without using bedrails?: Total Help needed moving to and from a bed to a chair  (including a wheelchair)?: Total Help needed standing up from a chair using your arms (e.g., wheelchair or bedside chair)?: Total Help needed to walk in hospital room?: Total Help needed climbing 3-5 steps with a railing? : Total 6 Click Score: 7    End of Session Equipment Utilized During Treatment: Gait belt;Oxygen Activity Tolerance: Patient limited by fatigue Patient left: in bed;with call bell/phone within reach Nurse Communication: Mobility status PT Visit Diagnosis: Muscle weakness (generalized) (M62.81);Unsteadiness on feet (R26.81);Difficulty in walking, not elsewhere classified (R26.2)    Time: 9794-8016 PT Time Calculation (min) (ACUTE ONLY): 37 min   Charges:   PT Evaluation $PT Eval Moderate Complexity: 1 Mod        Ellamae Sia, [place], DPT Acute Rehabilitation Services Pager [phone] Office [phone]  Willy Eddy 11/17/2018, 2:26 PM

## 2018-11-17 NOTE — Progress Notes (Signed)
ANTICOAGULATION CONSULT NOTE - Follow Up Consult  Pharmacy Consult for heparin Indication: DVT  Labs: Recent Labs    11/15/18 0559 11/15/18 1608  11/16/18 0443  11/16/18 1221 11/16/18 1518 11/17/18 0315  HGB 8.7*  --   --  8.4*  --   --   --   --   HCT 29.0*  --   --  27.6*  --   --   --   --   PLT 201  --   --  190  --   --   --   --   HEPARINUNFRC  --   --    < >  --    < > 1.09* 0.42 0.63  CREATININE 1.66* 1.64*  --  1.61*  --   --   --   --    < > = values in this interval not displayed.    Assessment/Plan:  73yo female therapeutic on heparin after rate changes. Will continue gtt at current rate and confirm stable with additional level.   Rogue Bussing 11/17/2018,4:00 AM

## 2018-11-17 NOTE — Progress Notes (Addendum)
Telemetry reviewed with Dr. Curt Bears, SR 50's-60's this AM, no sustained tachy rates, no further significant bradycardic rates. No PPM at this juncture, for reasons listed in yesterday's consultation, unless patient makes significant recovery/regins functional status.   Recommend goals of care, palliative discussion  Abigail [name] defer ongoing management to primary team Could consider resuming PO amiodarone if needed, 200mg  daily  [name], PA-C  Allegra Lai, MD

## 2018-11-18 ENCOUNTER — Inpatient Hospital Stay (HOSPITAL_COMMUNITY): Payer: Medicare HMO

## 2018-11-18 ENCOUNTER — Inpatient Hospital Stay: Payer: Self-pay

## 2018-11-18 DIAGNOSIS — D631 Anemia in chronic kidney disease: Secondary | ICD-10-CM

## 2018-11-18 DIAGNOSIS — Z8669 Personal history of other diseases of the nervous system and sense organs: Secondary | ICD-10-CM

## 2018-11-18 DIAGNOSIS — N183 Chronic kidney disease, stage 3 (moderate): Secondary | ICD-10-CM

## 2018-11-18 LAB — BASIC METABOLIC PANEL
Anion gap: 9 (ref 5–15)
BUN: 23 mg/dL (ref 8–23)
CO2: 16 mmol/L — ABNORMAL LOW (ref 22–32)
Calcium: 9 mg/dL (ref 8.9–10.3)
Chloride: 120 mmol/L — ABNORMAL HIGH (ref 98–111)
Creatinine, Ser: 1.86 mg/dL — ABNORMAL HIGH (ref 0.44–1.00)
GFR calc Af Amer: 31 mL/min — ABNORMAL LOW (ref >60)
GFR, EST NON AFRICAN AMERICAN: 27 mL/min — AB (ref >60)
Glucose, Bld: 127 mg/dL — ABNORMAL HIGH (ref 70–99)
Potassium: 3.1 mmol/L — ABNORMAL LOW (ref 3.5–5.1)
Sodium: 145 mmol/L (ref 135–145)

## 2018-11-18 LAB — GLUCOSE, CAPILLARY
Glucose-Capillary: 111 mg/dL — ABNORMAL HIGH (ref 70–99)
Glucose-Capillary: 129 mg/dL — ABNORMAL HIGH (ref 70–99)
Glucose-Capillary: 114 mg/dL — ABNORMAL HIGH (ref 70–99)
Glucose-Capillary: 103 mg/dL — ABNORMAL HIGH (ref 70–99)
Glucose-Capillary: 118 mg/dL — ABNORMAL HIGH (ref 70–99)
Glucose-Capillary: 122 mg/dL — ABNORMAL HIGH (ref 70–99)

## 2018-11-18 LAB — CBC
HCT: 25.9 % — ABNORMAL LOW (ref 36.0–46.0)
Hemoglobin: 7.7 g/dL — ABNORMAL LOW (ref 12.0–15.0)
MCH: 29.5 pg (ref 26.0–34.0)
MCHC: 29.7 g/dL — ABNORMAL LOW (ref 30.0–36.0)
MCV: 99.2 fL (ref 80.0–100.0)
PLATELETS: 193 10*3/uL (ref 150–400)
RBC: 2.61 MIL/uL — ABNORMAL LOW (ref 3.87–5.11)
RDW: 21 % — ABNORMAL HIGH (ref 11.5–15.5)
WBC: 13.8 10*3/uL — ABNORMAL HIGH (ref 4.0–10.5)
nRBC: 3.1 % — ABNORMAL HIGH (ref 0.0–0.2)

## 2018-11-18 LAB — MAGNESIUM: Magnesium: 2 mg/dL (ref 1.7–2.4)

## 2018-11-18 LAB — HEMOGLOBIN AND HEMATOCRIT, BLOOD
HCT: 25.8 % — ABNORMAL LOW (ref 36.0–46.0)
Hemoglobin: 7.9 g/dL — ABNORMAL LOW (ref 12.0–15.0)

## 2018-11-18 LAB — PHOSPHORUS: Phosphorus: 4 mg/dL (ref 2.5–4.6)

## 2018-11-18 MED ORDER — POTASSIUM CHLORIDE 10 MEQ/50ML IV SOLN
10.0000 meq | INTRAVENOUS | Status: AC
  Administered 2018-11-18 (×4): 10 meq via INTRAVENOUS
  Filled 2018-11-18 (×4): qty 50

## 2018-11-18 MED ORDER — FUROSEMIDE 80 MG PO TABS
80.0000 mg | ORAL_TABLET | Freq: Once | ORAL | Status: AC
  Administered 2018-11-18: 80 mg
  Filled 2018-11-18: qty 1

## 2018-11-18 MED ORDER — SODIUM CHLORIDE 0.9% FLUSH
10.0000 mL | Freq: Two times a day (BID) | INTRAVENOUS | Status: DC
  Administered 2018-11-18: 20 mL
  Administered 2018-11-19: 10 mL

## 2018-11-18 MED ORDER — SODIUM CHLORIDE 0.9% FLUSH: 10.0000 mL | INTRAVENOUS | Status: DC | PRN

## 2018-11-18 NOTE — Care Management Note (Signed)
Case Management Note  Patient Details  Name: Abigail [name] MRN: [id number] Date of Birth: June 02, 1946  Subjective/Objective: 73 yo female presented s/p VT/VF arrest. PMH: GERD, schatzki's ring, multiple myeloma, FTT, recent hospitalization for infectious colitis and septic shock, myasthenia gravis, afib, CKD, MI, HTN, HLD, CAD            Action/Plan: CM following for dispositional needs. Patient had lived alone PTA with PT/OT recommending SNF with CSW to follow to facilitate to SNF and Palliative following and attempting to reach patients sister to discuss [place].  Expected Discharge Date:                  Expected Discharge Plan:  [place]  In-House Referral:  Clinical Social Work, Hospice / Palliative Care  Discharge planning Services  CM Consult  Post Acute Care Choice:  NA Choice offered to:  NA  DME Arranged:  N/A DME Agency:  NA  HH Arranged:  NA HH Agency:  NA  Status of Service:  In process, will continue to follow  If discussed at Long Length of Stay Meetings, dates discussed:    Additional Comments:  Midge Minium RN, BSN, NCM-BC, ACM-RN [phone] 11/18/2018, 12:42 PM

## 2018-11-18 NOTE — Progress Notes (Signed)
Daily Progress Note   Patient Name: Abigail [name]       Date: 11/18/2018 DOB: 12-06-1945  Age: 73 y.o. MRN#: [id number] Attending Physician: Guilford Shi, MD Primary Care Physician: Nolene Ebbs, MD Admit Date: 11/12/2018  Reason for Consultation/Follow-up: Establishing goals of care  Subjective: Patient in bed. Had heme positive BM with hgb down to 7.7. Her neuro status continues to be unchanged. She opens eyes to her name. Will not follow commands. Per nursing report she will answer her name to them and is able to tell them she is in the hospital, but is otherwise nonverbal and not interactive.   Review of Systems  Unable to perform ROS: Acuity of condition    Length of Stay: 6  Current Medications: Scheduled Meds:  . amiodarone  200 mg Per Tube Daily  . atorvastatin  40 mg Oral q1800  . chlorhexidine  15 mL Mouth Rinse BID  . Chlorhexidine Gluconate Cloth  6 each Topical Daily  . feeding supplement (PRO-STAT SUGAR FREE 64)  30 mL Per Tube Daily  . fentaNYL (SUBLIMAZE) injection  50 mcg Intravenous Once  . insulin aspart  0-9 Units Subcutaneous Q4H  . mouth rinse  15 mL Mouth Rinse q12n4p  . metoprolol tartrate  12.5 mg Per Tube BID  . pyridostigmine  30 mg Oral BID  . sodium chloride flush  10-40 mL Intracatheter Q12H    Continuous Infusions: . sodium chloride 30 mL/hr at 11/18/18 1100  . feeding supplement (OSMOLITE 1.2 CAL) 1,000 mL (11/18/18 1058)  . fentaNYL infusion INTRAVENOUS Stopped (11/15/18 1345)    PRN Meds: hydrALAZINE, sodium chloride flush  Physical Exam Vitals signs and nursing note reviewed.  Constitutional:      Appearance: She is ill-appearing.  Cardiovascular:     Rate and Rhythm: Bradycardia present.  Pulmonary:     Effort: Pulmonary effort is  normal.  Skin:    Coloration: Skin is pale.  Neurological:     Comments: Non verbal  Psychiatric:     Comments: Flat affect             Vital Signs: BP 138/81   Pulse (!) 58   Temp 98.4 F (36.9 C) (Axillary)   Resp (!) 21   Ht '5\' 5"'  (1.651 m)   Wt 73.1 kg   SpO2  98%   BMI 26.82 kg/m  SpO2: SpO2: 98 % O2 Device: O2 Device: Nasal Cannula O2 Flow Rate: O2 Flow Rate (L/min): 3 L/min  Intake/output summary:   Intake/Output Summary (Last 24 hours) at 11/18/2018 1321 Last data filed at 11/18/2018 1100 Gross per 24 hour  Intake 1166.95 ml  Output 350 ml  Net 816.95 ml   LBM: Last BM Date: 11/18/18 Baseline Weight: Weight: 74 kg Most recent weight: Weight: 73.1 kg       Palliative Assessment/Data: PPS: 10%    Flowsheet Rows     Most Recent Value  Intake Tab  Referral Department  Hospitalist  Unit at Time of Referral  ICU  Palliative Care Primary Diagnosis  Cardiac  Date Notified  11/17/18  Palliative Care Type  New Palliative care  Reason for referral  Clarify Goals of Care  Date of Admission  11/12/18  Date first seen by Palliative Care  11/17/18  # of days Palliative referral response time  0 Day(s)  # of days IP prior to Palliative referral  5  Clinical Assessment  Psychosocial & Spiritual Assessment  Palliative Care Outcomes      Patient Active Problem List   Diagnosis Date Noted  . Acute respiratory failure with hypoxia ([place])   . AKI (acute kidney injury) ([place])   . Advanced care planning/counseling discussion   . Goals of care, counseling/discussion   . Palliative care by specialist   . Cardiac arrest with ventricular fibrillation ([place]) 11/12/2018  . Hypotension   . Septic shock ([place]) 10/23/2018  . Acute kidney injury superimposed on chronic kidney disease ([place]) 10/22/2018  . Colitis 10/22/2018  . Bradycardia 10/22/2018  . AF (paroxysmal atrial fibrillation) ([place]) 10/22/2018  . SVT (supraventricular tachycardia) ([place]) 10/08/2018  . Atrial  fibrillation with RVR ([place]) 1Apr 01, 202019  . Precordial chest pain 09/15/2018  . Multiple myeloma in relapse ([place]) 06/15/2018  . Encounter for central line placement 06/15/2018  . Hematochezia   . Acute blood loss anemia   . Diverticulosis of colon with hemorrhage   . GIB (gastrointestinal bleeding) 09/03/2017  . Symptomatic anemia 09/03/2017  . Acute systolic heart failure ([place]) 09/03/2017  . Chronic kidney disease (CKD), stage III (moderate) ([place]) 09/03/2017  . Hyperlipidemia 09/03/2017  . Multiple myeloma in remission ([place]) 09/03/2017  . GI bleed 08/17/2017  . Sepsis ([place]) 08/17/2017  . Fever 08/16/2017  . STEMI (ST elevation myocardial infarction) ([place]) 02/24/2017  . Acute anterolateral wall MI ([place]) 02/24/2017  . Dehydration 01/21/2017  . Acute coronary syndrome ([place]) 12/24/2016  . Dizziness 12/22/2016    Class: Acute  . Orthostatic hypotension 12/08/2016  . Syncope and collapse 12/06/2016  . Tachycardia-bradycardia ([place])   . Essential hypertension   . Nausea and vomiting in adult patient   . Erosive esophagitis   . Protein-calorie malnutrition, severe 11/19/2016  . LLQ pain   . LUQ abdominal pain   . Intractable nausea and vomiting 11/17/2016  . Abdominal pain 11/05/2016  . Multiple myeloma ([place]) 06/18/2016  . Malnutrition of moderate degree 06/14/2016  . 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 Exertional dyspnea 11/01/2015  . Coronary atherosclerosis of native coronary artery 11/01/2015  . Shortness of breath 11/01/2015  . Chest pain 12/31/2011  . Coronary artery disease 10/20/1997    Palliative Care Assessment & Plan   Patient Profile: 73 y.o. female  with past medical history of myasthenia gravis, a. Fib w/ RVR, prolonged QT, CKD III, multiple myeloma with diffuse lytic lesions- (  chemo recently held due to poor performance status, weight loss), recent hospitalization d/t septic shock from infectious colitis (discharged home on 1/14-  of note during that hospitalization patient stated she did not want aggressive or artificial life support measures per 1/3 note from Dr. Valeta Harms) admitted on 11/12/2018 with cardiac arrest x2 s/p CPR x 1 for 2 minutes and then x 10 minutes with intubation. She was noted to be posturing and had rightsided gaze preference in the ED. She is now extubated, however, mental status is slow to improve. ECHO showed 20-25% EF with diffuse hypokinesis. Palliative medicine consulted for [place].    Assessment/Recommendations/Plan   No family at bedside- I again called her listed emergency contact and left a message requesting a return call  Goals of Care and Additional Recommendations:  Limitations on Scope of Treatment: Full Scope Treatment  Code Status:  DNR  Prognosis:   Unable to determine  Discharge Planning:  To Be Determined  Thank you for allowing the Palliative Medicine Team to assist in the care of this patient.   Time In: 1300 Time Out: 1325 Total Time 25 mins Prolonged Time Billed no      Greater than 50%  of this time was spent counseling and coordinating care related to the above assessment and plan.  Mariana Kaufman, AGNP-C Palliative Medicine   Please contact Palliative Medicine Team phone at [phone] for questions and concerns.

## 2018-11-18 NOTE — Progress Notes (Addendum)
PROGRESS NOTE    Abigail [name]  XQJ:[id number]  DOB: 1946/04/13  DOA: 11/12/2018 PCP: Nolene Ebbs, MD  Brief Narrative:  73 year old woman with history of myasthenia gravis, atrial fibrillation, prolonged QTC, chronic kidney disease, multiple myeloma, failure to thrive with recent hospital admission for infectious colitis and septic shock who was admitted 1/23 after VT/VF arrest with overall time to [place] estimated at over 12 minutes. Hypothermia protocol was initiated. The arrest happened while she was on the phone and suddenly stopped talking, began to look offbut appeared to be breathing. EMS was called and on arrival she was found to be in a shockable rhythm. She was defibrillated three times and CPR was initiated, they obtained ROSC in about 2 minutes. She was intubated by EMS. She suffered cardiac arrest prior to arrival and had ROSC in about 10 minutes. On arrival to the ED she was posturing with a right sided gaze preference.  She was intubated.  Now extubated and transferred to the hospitalist service.  Patient at this time made DNR and being followed by palliative care for further goals of care discussions.   Subjective:  Patient awake and looking around in the room, he appears lethargic.  Nods to some questions but mostly nonverbal and not following commands.  Bedside monitor showing heart rate in the 50s.  Amiodarone drip on hold.  Per nurse, patient spontaneously moves all extremities although does not have much strength.  Noted to be on NG tube feeds. Objective: Vitals:   11/18/18 1000 11/18/18 1100 11/18/18 1143 11/18/18 1200  BP: (!) 143/69 (!) 152/95  138/81  Pulse: (!) 52 (!) 57  (!) 58  Resp: 18 (!) 21  (!) 21  Temp:   98.4 F (36.9 C)   TempSrc:   Axillary   SpO2: 100% 95%  98%  Weight:      Height:        Intake/Output Summary (Last 24 hours) at 11/18/2018 1308 Last data filed at 11/18/2018 1100 Gross per 24 hour  Intake 1166.95 ml  Output 350 ml  Net 816.95 ml    Filed Weights   11/16/18 0500 11/17/18 0530 11/18/18 0200  Weight: 73.4 kg 71.6 kg 73.1 kg    Physical Examination:  General exam: Appears calm and comfortable.  Saturating well on 2 L nasal cannula HEENT: Atraumatic, PERRLA, central line in place along right internal jugular Respiratory system: Clear to auscultation.  Decreased breath sounds at bases.  Respiratory effort normal. Cardiovascular system: S1 & S2 heard, no JVD, no pedal edema. Gastrointestinal system: Abdomen is nondistended, soft and nontender.Normal bowel sounds heard. Central nervous system: Awake but appears tired/lethargic.  Not very communicative this morning.  Could not assess further.   Skin: No rashes, lesions or ulcers Extremities: RLE swelling >left Psychiatry: Could not assess   Data Reviewed: I have personally reviewed following labs and imaging studies  CBC: Recent Labs  Lab 11/14/18 0400 11/15/18 0559 11/16/18 0443 11/17/18 0529 11/18/18 0211  WBC 16.5* 16.0* 19.3* 16.8* 13.8*  NEUTROABS 13.5*  --   --   --   --   HGB 9.4* 8.7* 8.4* 8.1* 7.7*  HCT 31.2* 29.0* 27.6* 26.6* 25.9*  MCV 100.3* 100.0 98.9 98.9 99.2  PLT 226 201 190 195 448   Basic Metabolic Panel: Recent Labs  Lab 11/15/18 0559  11/15/18 1608 11/16/18 0443 11/16/18 1700 11/17/18 0529 11/18/18 0211 11/18/18 0921  NA 145  --  142 143  --  145 145  --  K 3.2*  --  4.4 4.1  --  3.7 3.1*  --   CL 121*  --  118* 116*  --  119* 120*  --   CO2 15*  --  14* 15*  --  15* 16*  --   GLUCOSE 104*  --  112* 116*  --  96 127*  --   BUN 11  --  11 12  --  18 23  --   CREATININE 1.66*  --  1.64* 1.61*  --  1.64* 1.86*  --   CALCIUM 7.0*  --  7.3* 8.2*  --  9.2 9.0  --   MG 1.7  --  2.1 2.0 1.8 2.4  --  2.0  PHOS  --    < > 4.2 4.5 4.7* 5.2*  --  4.0   < > = values in this interval not displayed.   GFR: Estimated Creatinine Clearance: 27.8 mL/min (A) (by C-G formula based on SCr of 1.86 mg/dL (H)). Liver Function Tests: Recent  Labs  Lab 11/12/18 1200 11/12/18 1905 11/14/18 0400 11/15/18 0931  AST 140* 150* 50* 31  ALT 68* 66* 38 31  ALKPHOS 68 66 66 83  BILITOT 1.5* 1.3* 0.9 0.8  PROT 7.1 6.6 5.2* 5.3*  ALBUMIN 3.6 3.2* 2.4* 2.3*   No results for input(s): LIPASE, AMYLASE in the last 168 hours. No results for input(s): AMMONIA in the last 168 hours. Coagulation Profile: Recent Labs  Lab 11/12/18 1630 11/13/18 0617  INR 1.15 1.17   Cardiac Enzymes: Recent Labs  Lab 11/12/18 1630 11/12/18 1900 11/13/18 0136 11/13/18 0557  TROPONINI 0.65* 0.80* 0.92* 0.94*   BNP (last 3 results) No results for input(s): PROBNP in the last 8760 hours. HbA1C: No results for input(s): HGBA1C in the last 72 hours. CBG: Recent Labs  Lab 11/17/18 2017 11/17/18 2352 11/18/18 0332 11/18/18 0730 11/18/18 1103  GLUCAP 129* 96 111* 129* 114*   Lipid Profile: No results for input(s): CHOL, HDL, LDLCALC, TRIG, CHOLHDL, LDLDIRECT in the last 72 hours. Thyroid Function Tests: No results for input(s): TSH, T4TOTAL, FREET4, T3FREE, THYROIDAB in the last 72 hours. Anemia Panel: No results for input(s): VITAMINB12, FOLATE, FERRITIN, TIBC, IRON, RETICCTPCT in the last 72 hours. Sepsis Labs: No results for input(s): PROCALCITON, LATICACIDVEN in the last 168 hours.  Recent Results (from the past 240 hour(s))  MRSA PCR Screening     Status: None   Collection Time: 11/12/18  8:51 PM  Result Value Ref Range Status   MRSA by PCR NEGATIVE NEGATIVE Final    Comment:        The GeneXpert MRSA Assay (FDA approved for NASAL specimens only), is one component of a comprehensive MRSA colonization surveillance program. It is not intended to diagnose MRSA infection nor to guide or monitor treatment for MRSA infections. Performed at [facility], [place] [address] [place] [address] [place], [place] [address]       Radiology Studies: Dg Chest Port 1 View  Result Date: 11/18/2018 CLINICAL DATA:  Shortness of breath EXAM: PORTABLE  CHEST 1 VIEW COMPARISON:  11/16/2018 FINDINGS: Cardiac shadow is enlarged. Coronary stenting is seen. Aortic calcifications are again noted. Feeding catheter and right jugular central line are noted in satisfactory position. Mild increased vascular congestion is noted without interstitial edema. IMPRESSION: Tubes and lines as described above. Mild vascular congestion Electronically Signed   By: Inez Catalina M.D.   On: 11/18/2018 09:05        Scheduled Meds: .  amiodarone  200 mg Per Tube Daily  . atorvastatin  40 mg Oral q1800  . chlorhexidine  15 mL Mouth Rinse BID  . Chlorhexidine Gluconate Cloth  6 each Topical Daily  . feeding supplement (PRO-STAT SUGAR FREE 64)  30 mL Per Tube Daily  . fentaNYL (SUBLIMAZE) injection  50 mcg Intravenous Once  . insulin aspart  0-9 Units Subcutaneous Q4H  . mouth rinse  15 mL Mouth Rinse q12n4p  . metoprolol tartrate  12.5 mg Per Tube BID  . pyridostigmine  30 mg Oral BID  . sodium chloride flush  10-40 mL Intracatheter Q12H   Continuous Infusions: . sodium chloride 30 mL/hr at 11/18/18 1100  . feeding supplement (OSMOLITE 1.2 CAL) 1,000 mL (11/18/18 1058)  . fentaNYL infusion INTRAVENOUS Stopped (11/15/18 1345)    Assessment & Plan:    1.  V. tach/V. fib arrest: Appreciate cardiology evaluation and follow-up.  Amiodarone transition to oral per cardiology.  Continue metoprolol (lower dose) with holding parameters.  Replace potassium to keep above 4, magnesium level noted to be at 2.  Repeat labs in a.m. Patient seen by electrophysiology and not felt to be a candidate for pacemaker/AICD.    2.  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  Diuresed in the initial hospital course.  Now off Lasix.  Chest x-ray today showed mild edema.  Taper O2 to off as tolerated.    3.  Metabolic encephalopathy: In the setting of problem #1.  She may  have some anoxic component.  CT head on January 24 showed Lytic lesions throughout the calvarium consistent with diffuse Myeloma.  4.  Multiple myeloma with diffuse lytic bone lesions: Most recent zometa infusion 10/08/2017, chemotherapy currently on hold.   5.  Myasthenia gravis: Receiving pyridostigmine via NG tube  6.  Acute on chronic renal failure post cardiac arrest: Cardiorenal syndrome versus ATN post cardiac arrest.  Baseline creatinine around 1.4.  Creatinine appears stable around 1.6, somewhat worse at 1.8 today.  Can increase free water via NG tube but concerned about chest x-ray findings suggesting mild pulmonary edema.  Repeat labs in a.m.  7. Acute RLE DVT / possible PE: Started on IV heparin during hospital course, now held in view of anemia/heme positive stool   8.  CAD: Patient is status post PTCA in LAD/LCx.Troponin in this admission peaked to 0.9.  Patient did not undergo cardiac cath this admission.  Goals of care discussion ongoing with family by palliative care.  Continue beta-blocker/statins.  9.  Acute on chronic anemia: Patient's baseline hemoglobin appears to be 8.5-9.5.  Labs today show hemoglobin at 7.7.  Guaiac positive stool.  Not on any antiplatelets or anticoagulants currently. Heparin held.  Will transfuse 1 unit if repeat hgb downtrending this afternoon as concern for #1. Care goals being determined by palliative care.   10.  Leukocytosis:?  Reactive.  Afebrile.  U/A somewhat abnormal on January 25.  Will repeat   DVT prophylaxis: SCD Code Status: DNR Family / Patient Communication: Palliative care team trying to contact and meet with family. Prognosis poor Disposition Plan: To be decided.     LOS: 6 days    Time spent: 40 minutes    Guilford Shi, MD [facility] Pager [phone]  If 7PM-7AM, please contact night-coverage [email or web address] Password [email or web address] 11/18/2018, 1:08 PM

## 2018-11-18 NOTE — Progress Notes (Signed)
  Modified Barium Swallow Progress Note  Patient Details  Name: Abigail [name] MRN: [id number] Date of Birth: Feb 01, 1946  Today's Date: 11/18/2018  Modified Barium Swallow completed.  Full report located under Chart Review in the Imaging Section.  Brief recommendations include the following:  Clinical Impression  Pt presents with cognitive based oral dysphagia c/b significant oral holding and some premature spillage. Oral holding of thin liquid and puree observed, but eventually pt swallowed, demonstrating strength in tongue-base retraction and hyolaryngeal excursion for airway protection. One instance of premature spillage of thin liquid noted, which resulted in flash penetration with immediate ejection of bolus. Mechanical soft PO held in oral cavity until it had be physically removed, even after max cues to masticate. Some coughing noted in between POs, however with fluoroscopy turned on there was no evidence of oral or pharyngeal cause. Recommend puree, thin diet with meds crushed in puree. Will f/u for consistent oral intake and tolerance of diet with improved mentation.   Swallow Evaluation Recommendations       SLP Diet Recommendations: Dysphagia 1 (Puree) solids;Thin liquid   Liquid Administration via: Straw;Cup   Medication Administration: Crushed with puree   Supervision: Full assist for feeding           Oral Care Recommendations: Oral care QID   Other Recommendations: Have oral suction available    Ellis Savage, SLP Student 11/18/2018,2:58 PM

## 2018-11-18 NOTE — Progress Notes (Signed)
Peripherally Inserted Central Catheter/Midline Placement  The IV Nurse has discussed with the patient and/or persons authorized to consent for the patient, the purpose of this procedure and the potential benefits and risks involved with this procedure.  The benefits include less needle sticks, lab draws from the catheter, and the patient may be discharged home with the catheter. Risks include, but not limited to, infection, bleeding, blood clot (thrombus formation), and puncture of an artery; nerve damage and irregular heartbeat and possibility to perform a PICC exchange if needed/ordered by physician.  Alternatives to this procedure were also discussed.  Bard Power PICC patient education guide, fact sheet on infection prevention and patient information card has been provided to patient /or left at bedside.  Consent obtained from sister via telephone due to altered mental status.  PICC/Midline Placement Documentation  PICC Double Lumen 11/18/18 PICC Right Brachial 31 cm 0 cm (Active)  Indication for Insertion or Continuance of Line Prolonged intravenous therapies 11/18/2018  9:14 PM  Exposed Catheter (cm) 0 cm 11/18/2018  9:14 PM  Site Assessment Clean;Dry;Intact 11/18/2018  9:14 PM  Lumen #1 Status Flushed;Saline locked;Blood return noted 11/18/2018  9:14 PM  Lumen #2 Status Flushed;Saline locked;Blood return noted 11/18/2018  9:14 PM  Dressing Type Transparent 11/18/2018  9:14 PM  Dressing Status Clean;Dry;Intact 11/18/2018  9:14 PM  Dressing Intervention New dressing 11/18/2018  9:14 PM  Dressing Change Due 11/25/18 11/18/2018  9:14 PM       [place], Nicolette Bang 11/18/2018, 9:15 PM

## 2018-11-18 NOTE — Progress Notes (Signed)
Palliative non face to face followup encounter:   Received return call from patient's sister- Abigail [name]. Per Abigail [name] she and patient's other sister are decision makers for patient. She states that her sister had been having decline in nutrition and function over the last year. Was having a great deal of stomach pain since her discharge home and was not eating. Discussed option of continued aggressive care vs comfort measures and hospice.  Abigail [name] stated she needed to speak with patient's other sister. Abigail [name] requested PMT call her tomorrow afternoon to arrange a meeting with her and her sister.   Abigail [name], AGNP-C Palliative Medicine  Please call Palliative Medicine team phone with any questions [phone]. For individual providers please see AMION.  Time In: 1615 Time Out: 1650 Total Time: 35 minutes  Greater than 50%  of this time was spent counseling and coordinating care related to the above assessment and plan

## 2018-11-18 NOTE — Progress Notes (Signed)
Patient at this time upon assessment still needs to have the central line. The patient has poor vein access and at this time no PIV access or midline access is appropriate

## 2018-11-18 NOTE — Progress Notes (Signed)
Called Dr. Doylene Canard at [phone] about patients positive hemoccult, decreased hgb and continued diarrhea. Patient's K is 3.1 this am, down from 3.7 yesterday. Ordered to hold Heparin gtt until further evaluation from MD, and get AM PCXR to evaluate fluid status on lungs. Possible unit of PRBCs needed if lungs do not appear congested on AM CXR. Orders given to give 10 mEq of Kcl q2 x 4 doses.    Abigail [name]

## 2018-11-18 NOTE — Social Work (Signed)
CSW acknowledging consult for SNF placement. Will follow for therapy recommendations. Aware PMT following for [place].   Westley Hummer, MSW, [place] Work [phone]

## 2018-11-18 NOTE — Progress Notes (Signed)
  Speech Language Pathology Treatment: Dysphagia  Patient Details Name: Abigail [name] MRN: [id number] DOB: 1946-03-07 Today's Date: 11/18/2018 Time: 7615-1834 SLP Time Calculation (min) (ACUTE ONLY): 11 min  Assessment / Plan / Recommendation Clinical Impression  Pt shows decreased signs of airway compromise today, with no overt coughing noted with thin liquids or purees. She does continue to have multiple swallows and cognitively needs Min-Mod cues to facilitate oral holding. Would proceed with instrumental testing to better assess oropharyngeal function and assist in determining if POs can be started. Will schedule as able - likely tomorrow.   HPI HPI: Pt is a 73 yo female admitted post VT/VF arrest x2, shocked 3x and intubated in the field with approximately 12 minutes of cardiac arrest prior to achieving ROSC.  Upon arrival to the ED she was posturing with a R sided gaze preference. ETT 1/24-1/27. PMH: GERD, schatzki's ring, multiple myeloma, FTT, recent hospitalization for infectious colitis and septic shock, myasthenia gravis, afib, CKD, MI, HTN, HLD, CAD      SLP Plan  MBS       Recommendations  Diet recommendations: NPO Medication Administration: Via alternative means                Oral Care Recommendations: Oral care QID Follow up Recommendations: Skilled Nursing facility SLP Visit Diagnosis: Dysphagia, unspecified (R13.10) Plan: MBS       GO                Abigail [name] Abigail [name] 11/18/2018, 10:55 AM  Abigail [name], M.A. [place] Acute Environmental education officer [phone] Office [phone]

## 2018-11-18 NOTE — Progress Notes (Signed)
Ref: Nolene Ebbs, MD   Subjective:  Awake. Had large BM yesterday, it was heme positive. She is off heparin. Hgb is down to 7.7 and potassium is down to 3.1. She is awaiting transfer to stepdown bed. Chest x-ray shows mild vascular congestion. Heart rate and BP reading are slightly improved.  Objective:  Vital Signs in the last 24 hours: Temp:  [97 F (36.1 C)-98.4 F (36.9 C)] 97.4 F (36.3 C) (01/30 0834) Pulse Rate:  [45-136] 65 (01/30 0815) Cardiac Rhythm: Junctional rhythm (01/30 0815) Resp:  [12-23] 19 (01/30 0815) BP: (72-186)/(49-142) 138/53 (01/30 0815) SpO2:  [88 %-100 %] 91 % (01/30 0815) Weight:  [73.1 kg] 73.1 kg (01/30 0200)  Physical Exam: BP Readings from Last 1 Encounters:  11/18/18 (!) 138/53     Wt Readings from Last 1 Encounters:  11/18/18 73.1 kg    Weight change: 1.5 kg Body mass index is 26.82 kg/m. HEENT: [place]/AT, Eyes-Brown, Conjunctiva-Pale, Sclera-Non-icteric Neck: No JVD, No bruit, Trachea midline. Lungs:  Basal crackles, Bilateral. Cardiac:  Irregular rhythm, normal S1 and S2, no S3. II/VI systolic murmur. Abdomen:  Soft, non-tender. BS present. Extremities:  1 + right lower leg edema present. No cyanosis. No clubbing. CNS: AxOx1, Cranial nerves grossly intact, moves all 4 extremities.  Skin: Warm and dry.   Intake/Output from previous day: 01/29 0701 - 01/30 0700 In: 1221.2 [I.V.:855.8; NG/GT:280; IV Piggyback:85.4] Out: 400 [Urine:400]    Lab Results: BMET    Component Value Date/Time   NA 145 11/18/2018 0211   NA 145 11/17/2018 0529   NA 143 11/16/2018 0443   NA 144 09/17/2017 1429   NA 141 07/30/2017 1504   NA 142 06/11/2017 1451   K 3.1 (L) 11/18/2018 0211   K 3.7 11/17/2018 0529   K 4.1 11/16/2018 0443   K 3.1 (L) 09/17/2017 1429   K 4.8 07/30/2017 1504   K 4.3 06/11/2017 1451   CL 120 (H) 11/18/2018 0211   CL 119 (H) 11/17/2018 0529   CL 116 (H) 11/16/2018 0443   CO2 16 (L) 11/18/2018 0211   CO2 15 (L) 11/17/2018  0529   CO2 15 (L) 11/16/2018 0443   CO2 23 09/17/2017 1429   CO2 20 (L) 07/30/2017 1504   CO2 20 (L) 06/11/2017 1451   GLUCOSE 127 (H) 11/18/2018 0211   GLUCOSE 96 11/17/2018 0529   GLUCOSE 116 (H) 11/16/2018 0443   GLUCOSE 96 09/17/2017 1429   GLUCOSE 79 07/30/2017 1504   GLUCOSE 74 06/11/2017 1451   BUN 23 11/18/2018 0211   BUN 18 11/17/2018 0529   BUN 12 11/16/2018 0443   BUN 15.6 09/17/2017 1429   BUN 24.4 07/30/2017 1504   BUN 18.7 06/11/2017 1451   CREATININE 1.86 (H) 11/18/2018 0211   CREATININE 1.64 (H) 11/17/2018 0529   CREATININE 1.61 (H) 11/16/2018 0443   CREATININE 1.97 (H) 10/21/2018 1055   CREATININE 1.36 (H) 10/07/2018 0823   CREATININE 1.41 (H) 09/30/2018 1005   CREATININE 1.0 09/17/2017 1429   CREATININE 1.2 (H) 07/30/2017 1504   CREATININE 1.2 (H) 06/11/2017 1451   CALCIUM 9.0 11/18/2018 0211   CALCIUM 9.2 11/17/2018 0529   CALCIUM 8.2 (L) 11/16/2018 0443   CALCIUM 8.6 09/17/2017 1429   CALCIUM 9.1 07/30/2017 1504   CALCIUM 9.6 06/11/2017 1451   GFRNONAA 27 (L) 11/18/2018 0211   GFRNONAA 31 (L) 11/17/2018 0529   GFRNONAA 32 (L) 11/16/2018 0443   GFRNONAA 25 (L) 10/21/2018 1055   GFRNONAA 39 (L) 10/07/2018 5573  GFRNONAA 37 (L) 09/30/2018 1005   GFRAA 31 (L) 11/18/2018 0211   GFRAA 36 (L) 11/17/2018 0529   GFRAA 37 (L) 11/16/2018 0443   GFRAA 29 (L) 10/21/2018 1055   GFRAA 45 (L) 10/07/2018 0823   GFRAA 43 (L) 09/30/2018 1005   CBC    Component Value Date/Time   WBC 13.8 (H) 11/18/2018 0211   RBC 2.61 (L) 11/18/2018 0211   HGB 7.7 (L) 11/18/2018 0211   HGB 9.1 (L) 10/21/2018 1055   HGB 11.3 (L) 09/17/2017 1428   HCT 25.9 (L) 11/18/2018 0211   HCT 35.8 09/17/2017 1428   PLT 193 11/18/2018 0211   PLT 271 10/21/2018 1055   PLT 231 09/17/2017 1428   MCV 99.2 11/18/2018 0211   MCV 93.2 09/17/2017 1428   MCH 29.5 11/18/2018 0211   MCHC 29.7 (L) 11/18/2018 0211   RDW 21.0 (H) 11/18/2018 0211   RDW 16.5 (H) 09/17/2017 1428   LYMPHSABS 0.6 (L)  11/14/2018 0400   LYMPHSABS 0.7 (L) 09/17/2017 1428   MONOABS 1.8 (H) 11/14/2018 0400   MONOABS 0.5 09/17/2017 1428   EOSABS 0.0 11/14/2018 0400   EOSABS 0.1 09/17/2017 1428   BASOSABS 0.1 11/14/2018 0400   BASOSABS 0.0 09/17/2017 1428   HEPATIC Function Panel Recent Labs    11/12/18 1905 11/14/18 0400 11/15/18 0931  PROT 6.6 5.2* 5.3*   HEMOGLOBIN A1C No components found for: HGA1C,  MPG CARDIAC ENZYMES Lab Results  Component Value Date   CKTOTAL 52 08/21/2017   CKMB 1.3 08/21/2017   TROPONINI 0.94 (HH) 11/13/2018   TROPONINI 0.92 (HH) 11/13/2018   TROPONINI 0.80 (HH) 11/12/2018   BNP No results for input(s): PROBNP in the last 8760 hours. TSH Recent Labs    12/13/17 2243 10/02/18 1825  TSH 1.922 0.751   CHOLESTEROL Recent Labs    12/14/17 0457 09/02/18 0152 10/03/18 0637  CHOL 167 143 136    Scheduled Meds: . amiodarone  200 mg Per Tube Daily  . atorvastatin  40 mg Oral q1800  . chlorhexidine  15 mL Mouth Rinse BID  . Chlorhexidine Gluconate Cloth  6 each Topical Daily  . feeding supplement (PRO-STAT SUGAR FREE 64)  30 mL Per Tube Daily  . fentaNYL (SUBLIMAZE) injection  50 mcg Intravenous Once  . insulin aspart  0-9 Units Subcutaneous Q4H  . mouth rinse  15 mL Mouth Rinse q12n4p  . metoprolol tartrate  12.5 mg Per Tube BID  . pyridostigmine  30 mg Oral BID  . sodium chloride flush  10-40 mL Intracatheter Q12H   Continuous Infusions: . sodium chloride 30 mL/hr at 11/18/18 0800  . feeding supplement (OSMOLITE 1.2 CAL) 1,000 mL (11/17/18 1518)  . fentaNYL infusion INTRAVENOUS Stopped (11/15/18 1345)  . potassium chloride 10 mEq (11/18/18 0825)   PRN Meds:.hydrALAZINE, sodium chloride flush  Assessment/Plan: V. Fib cardiac arrest Acute respiratory failure with hypoxemia, improving Acute systolic left heart failure Possible aspiration pneumonia Possible PE Acute right leg DVT Multiple myeloma CAD Stents in LAD and LCx Anemia of chronic  disease Anemia of recurrent GI bleed Acute renal failure Hypokalemia Depression  Potassium supplement Reduced dose amiodarone and metoprolol Off heparin for anemia and GI bleed. Tube feeding. Prognosis guarded. Palliative care consult. Transfer to floor/Step down.   LOS: 6 days    [name]  MD  11/18/2018, 10:12 AM

## 2018-11-19 LAB — BASIC METABOLIC PANEL
Anion gap: 8 (ref 5–15)
BUN: 24 mg/dL — AB (ref 8–23)
CHLORIDE: 118 mmol/L — AB (ref 98–111)
CO2: 19 mmol/L — ABNORMAL LOW (ref 22–32)
CREATININE: 1.44 mg/dL — AB (ref 0.44–1.00)
Calcium: 8.2 mg/dL — ABNORMAL LOW (ref 8.9–10.3)
GFR calc Af Amer: 42 mL/min — ABNORMAL LOW (ref >60)
GFR calc non Af Amer: 36 mL/min — ABNORMAL LOW (ref >60)
Glucose, Bld: 97 mg/dL (ref 70–99)
POTASSIUM: 3 mmol/L — AB (ref 3.5–5.1)
Sodium: 145 mmol/L (ref 135–145)

## 2018-11-19 LAB — CBC
HEMATOCRIT: 24.1 % — AB (ref 36.0–46.0)
Hemoglobin: 7.5 g/dL — ABNORMAL LOW (ref 12.0–15.0)
MCH: 30.2 pg (ref 26.0–34.0)
MCHC: 31.1 g/dL (ref 30.0–36.0)
MCV: 97.2 fL (ref 80.0–100.0)
Platelets: 174 10*3/uL (ref 150–400)
RBC: 2.48 MIL/uL — ABNORMAL LOW (ref 3.87–5.11)
RDW: 20.2 % — ABNORMAL HIGH (ref 11.5–15.5)
WBC: 14.2 10*3/uL — ABNORMAL HIGH (ref 4.0–10.5)
nRBC: 2.7 % — ABNORMAL HIGH (ref 0.0–0.2)

## 2018-11-19 LAB — GLUCOSE, CAPILLARY
GLUCOSE-CAPILLARY: 96 mg/dL (ref 70–99)
Glucose-Capillary: 112 mg/dL — ABNORMAL HIGH (ref 70–99)
Glucose-Capillary: 89 mg/dL (ref 70–99)
Glucose-Capillary: 95 mg/dL (ref 70–99)

## 2018-11-19 LAB — PATHOLOGIST SMEAR REVIEW

## 2018-11-19 MED ORDER — CEFAZOLIN SODIUM 1 G IJ SOLR: 1.0000 g | Freq: Two times a day (BID) | INTRAMUSCULAR | Status: DC

## 2018-11-19 MED ORDER — POTASSIUM CHLORIDE 20 MEQ/15ML (10%) PO SOLN
20.0000 meq | Freq: Three times a day (TID) | ORAL | Status: DC
  Administered 2018-11-19 – 2018-11-20 (×4): 20 meq
  Filled 2018-11-19 (×4): qty 15

## 2018-11-19 MED ORDER — PYRIDOSTIGMINE BROMIDE 60 MG/5ML PO SOLN
30.0000 mg | Freq: Two times a day (BID) | ORAL | Status: DC
  Administered 2018-11-19 – 2018-12-03 (×28): 30 mg
  Filled 2018-11-19 (×28): qty 2.5

## 2018-11-19 MED ORDER — CEFAZOLIN SODIUM-DEXTROSE 1-4 GM/50ML-% IV SOLN
1.0000 g | Freq: Three times a day (TID) | INTRAVENOUS | Status: AC
  Administered 2018-11-19 – 2018-11-21 (×6): 1 g via INTRAVENOUS
  Filled 2018-11-19 (×6): qty 50

## 2018-11-19 MED ORDER — ATORVASTATIN CALCIUM 40 MG PO TABS
40.0000 mg | ORAL_TABLET | Freq: Every day | ORAL | Status: DC
  Administered 2018-11-19 – 2018-11-30 (×12): 40 mg
  Filled 2018-11-19 (×12): qty 1

## 2018-11-19 NOTE — Progress Notes (Signed)
Ref: Nolene Ebbs, MD   Subjective:  Right tibial puncture site with small oozing and large eccymotic area with tenderness. Hgb remains low. VS stable. Follow commands. Does not move right leg due to swelling and pain. Moves all other 3 extremites on command and spontaneously. Hypokalemia persist. Monitor shows sinus rhythm with prematue atrial complexes.  Objective:  Vital Signs in the last 24 hours: Temp:  [98.2 F (36.8 C)-98.4 F (36.9 C)] 98.2 F (36.8 C) (01/30 2017) Pulse Rate:  [43-73] 73 (01/30 2017) Cardiac Rhythm: Normal sinus rhythm (01/31 0700) Resp:  [16-22] 20 (01/30 2017) BP: (135-164)/(68-95) 163/79 (01/30 2017) SpO2:  [90 %-100 %] 94 % (01/30 2017) Weight:  [74.8 kg] 74.8 kg (01/30 2017)  Physical Exam: BP Readings from Last 1 Encounters:  11/18/18 (!) 163/79     Wt Readings from Last 1 Encounters:  11/18/18 74.8 kg    Weight change: 1.7 kg Body mass index is 27.44 kg/m. HEENT: [place]/AT, Eyes-Brown, Conjunctiva-Pale, Sclera-Non-icteric Neck: No JVD, No bruit, Trachea midline. Lungs:  Clearing, Bilateral. Cardiac:  Regular rhythm, normal S1 and S2, no S3. II/VI systolic murmur. Abdomen:  Soft, non-tender. BS present. Extremities:  1 + right leg edema and tenderness more so aroung bone punture site. No cyanosis. No clubbing. CNS: AxOx2, Cranial nerves grossly intact, moves all 4 extremities with limited motion of right lower extremity..  Skin: Warm and dry.   Intake/Output from previous day: 01/30 0701 - 01/31 0700 In: 463.1 [I.V.:356.6; IV Piggyback:106.4] Out: -     Lab Results: BMET    Component Value Date/Time   NA 145 11/19/2018 0557   NA 145 11/18/2018 0211   NA 145 11/17/2018 0529   NA 144 09/17/2017 1429   NA 141 07/30/2017 1504   NA 142 06/11/2017 1451   K 3.0 (L) 11/19/2018 0557   K 3.1 (L) 11/18/2018 0211   K 3.7 11/17/2018 0529   K 3.1 (L) 09/17/2017 1429   K 4.8 07/30/2017 1504   K 4.3 06/11/2017 1451   CL 118 (H) 11/19/2018  0557   CL 120 (H) 11/18/2018 0211   CL 119 (H) 11/17/2018 0529   CO2 19 (L) 11/19/2018 0557   CO2 16 (L) 11/18/2018 0211   CO2 15 (L) 11/17/2018 0529   CO2 23 09/17/2017 1429   CO2 20 (L) 07/30/2017 1504   CO2 20 (L) 06/11/2017 1451   GLUCOSE 97 11/19/2018 0557   GLUCOSE 127 (H) 11/18/2018 0211   GLUCOSE 96 11/17/2018 0529   GLUCOSE 96 09/17/2017 1429   GLUCOSE 79 07/30/2017 1504   GLUCOSE 74 06/11/2017 1451   BUN 24 (H) 11/19/2018 0557   BUN 23 11/18/2018 0211   BUN 18 11/17/2018 0529   BUN 15.6 09/17/2017 1429   BUN 24.4 07/30/2017 1504   BUN 18.7 06/11/2017 1451   CREATININE 1.44 (H) 11/19/2018 0557   CREATININE 1.86 (H) 11/18/2018 0211   CREATININE 1.64 (H) 11/17/2018 0529   CREATININE 1.97 (H) 10/21/2018 1055   CREATININE 1.36 (H) 10/07/2018 0823   CREATININE 1.41 (H) 09/30/2018 1005   CREATININE 1.0 09/17/2017 1429   CREATININE 1.2 (H) 07/30/2017 1504   CREATININE 1.2 (H) 06/11/2017 1451   CALCIUM 8.2 (L) 11/19/2018 0557   CALCIUM 9.0 11/18/2018 0211   CALCIUM 9.2 11/17/2018 0529   CALCIUM 8.6 09/17/2017 1429   CALCIUM 9.1 07/30/2017 1504   CALCIUM 9.6 06/11/2017 1451   GFRNONAA 36 (L) 11/19/2018 0557   GFRNONAA 27 (L) 11/18/2018 0211   GFRNONAA 31 (  L) 11/17/2018 0529   GFRNONAA 25 (L) 10/21/2018 1055   GFRNONAA 39 (L) 10/07/2018 0823   GFRNONAA 37 (L) 09/30/2018 1005   GFRAA 42 (L) 11/19/2018 0557   GFRAA 31 (L) 11/18/2018 0211   GFRAA 36 (L) 11/17/2018 0529   GFRAA 29 (L) 10/21/2018 1055   GFRAA 45 (L) 10/07/2018 0823   GFRAA 43 (L) 09/30/2018 1005   CBC    Component Value Date/Time   WBC 14.2 (H) 11/19/2018 0557   RBC 2.48 (L) 11/19/2018 0557   HGB 7.5 (L) 11/19/2018 0557   HGB 9.1 (L) 10/21/2018 1055   HGB 11.3 (L) 09/17/2017 1428   HCT 24.1 (L) 11/19/2018 0557   HCT 35.8 09/17/2017 1428   PLT 174 11/19/2018 0557   PLT 271 10/21/2018 1055   PLT 231 09/17/2017 1428   MCV 97.2 11/19/2018 0557   MCV 93.2 09/17/2017 1428   MCH 30.2 11/19/2018 0557    MCHC 31.1 11/19/2018 0557   RDW 20.2 (H) 11/19/2018 0557   RDW 16.5 (H) 09/17/2017 1428   LYMPHSABS 0.6 (L) 11/14/2018 0400   LYMPHSABS 0.7 (L) 09/17/2017 1428   MONOABS 1.8 (H) 11/14/2018 0400   MONOABS 0.5 09/17/2017 1428   EOSABS 0.0 11/14/2018 0400   EOSABS 0.1 09/17/2017 1428   BASOSABS 0.1 11/14/2018 0400   BASOSABS 0.0 09/17/2017 1428   HEPATIC Function Panel Recent Labs    11/12/18 1905 11/14/18 0400 11/15/18 0931  PROT 6.6 5.2* 5.3*   HEMOGLOBIN A1C No components found for: HGA1C,  MPG CARDIAC ENZYMES Lab Results  Component Value Date   CKTOTAL 52 08/21/2017   CKMB 1.3 08/21/2017   TROPONINI 0.94 (HH) 11/13/2018   TROPONINI 0.92 (HH) 11/13/2018   TROPONINI 0.80 (HH) 11/12/2018   BNP No results for input(s): PROBNP in the last 8760 hours. TSH Recent Labs    12/13/17 2243 10/02/18 1825  TSH 1.922 0.751   CHOLESTEROL Recent Labs    12/14/17 0457 09/02/18 0152 10/03/18 0637  CHOL 167 143 136    Scheduled Meds: . amiodarone  200 mg Per Tube Daily  . atorvastatin  40 mg Oral q1800  . ceFAZolin  1 g Intramuscular Q12H  . chlorhexidine  15 mL Mouth Rinse BID  . Chlorhexidine Gluconate Cloth  6 each Topical Daily  . feeding supplement (PRO-STAT SUGAR FREE 64)  30 mL Per Tube Daily  . fentaNYL (SUBLIMAZE) injection  50 mcg Intravenous Once  . insulin aspart  0-9 Units Subcutaneous Q4H  . mouth rinse  15 mL Mouth Rinse q12n4p  . metoprolol tartrate  12.5 mg Per Tube BID  . potassium chloride  20 mEq Per Tube TID  . pyridostigmine  30 mg Oral BID  . sodium chloride flush  10-40 mL Intracatheter Q12H  . sodium chloride flush  10-40 mL Intracatheter Q12H   Continuous Infusions: . sodium chloride 20 mL/hr at 11/18/18 1800  . feeding supplement (OSMOLITE 1.2 CAL) 1,000 mL (11/19/18 0953)  . fentaNYL infusion INTRAVENOUS Stopped (11/15/18 1345)   PRN Meds:.hydrALAZINE, sodium chloride flush, sodium chloride flush  Assessment/Plan: V.fib cardiac  arrest Acute respiratory failure with hypoxemia, improving Acute systolic left heart failure Possible pneumonia Possible PE Acute right leg DVT Multiple myeloma Anemia of chronic disease and chronic blood loss Acute renal failure Hypokalemia Depression CAD Stents in LAD and LCx Right lower leg hematoma and tenderness ar tibial bone puncture site.  Potassium supplement Tube feeding Increase activity IV ancef for right lower leg hematoma and tenderness  LOS: 7 days    [name]  MD  11/19/2018, 9:57 AM

## 2018-11-19 NOTE — Progress Notes (Signed)
Palliative Medicine RN Note: Rec'd a call from pt's sister [name]. She and her sister will be here in 45 min-1 hour to meet with [place]. We discussed the plan for a meeting later today, but she is unable to meet later. I spoke with our NP Romona Curls, who will meet them in the room between 1145 & noon.   I attempted to call [name] back to confirm at the number she left ([phone]); no answer, left message.  Marjie Skiff Amedio Bowlby, RN, BSN, [facility] Palliative Medicine Team 11/19/2018 10:57 AM Office [phone]

## 2018-11-19 NOTE — Care Management Important Message (Signed)
Important Message  Patient Details  Name: Abigail [name] MRN: [id number] Date of Birth: [birth year]   Medicare Important Message Given:  Yes    Chon Buhl P Dwan Hemmelgarn 11/19/2018, 3:00 PM

## 2018-11-19 NOTE — Progress Notes (Signed)
Physical Therapy Treatment Patient Details Name: Abigail [name] MRN: [id number] DOB: 09/29/1946 Today's Date: 11/19/2018    History of Present Illness Pt is a 73 y.o. F with significant PMH of CKD stage III, myasthenia gravis, multiple myeloma, failure to thrive with recent hospital admission for infectious colitis and septic shock who was admitted after VT/VF arrest with overall time to ROSC estimated at 12 minutes. Intubated 1/24-1/27. Also found to have RLE DVT.     PT Comments    Patient progressing slowly towards PT goals. Tolerated sitting EOB performing there ex and ADL tasks with Min guard-supervision for safety. Pt follows ~75% of simple 1 step commands with increased time. Pt fatigues quickly. Able to stand with Max A of 2 but difficulty getting fully upright. Minimal verbalizations throughout. Continue to recommend SNF. Will follow.    Follow Up Recommendations  SNF     Equipment Recommendations  None recommended by PT    Recommendations for Other Services       Precautions / Restrictions Precautions Precautions: Fall Precaution Comments: cortrak, pain with leg movements Restrictions Weight Bearing Restrictions: No    Mobility  Bed Mobility Overal bed mobility: Needs Assistance Bed Mobility: Sit to Sidelying;Sidelying to Sit   Sidelying to sit: Max assist;+2 for physical assistance     Sit to sidelying: Max assist;+2 for physical assistance General bed mobility comments: Able to initiate some movement with BLEs, otherwise assist with bottom, trunk and LEs to get to EOB. Assist to return to supine and reposition.  Transfers Overall transfer level: Needs assistance Equipment used: Rolling walker (2 wheeled) Transfers: Sit to/from Stand Sit to Stand: Max assist;+2 physical assistance         General transfer comment: Able to power to standing with assist of 2 with flexed trunk but not able to get fully upright. Attempted 2nd time and no effort to stand.    Ambulation/Gait             General Gait Details: Unable   Stairs             Wheelchair Mobility    Modified Rankin (Stroke Patients Only)       Balance Overall balance assessment: Needs assistance Sitting-balance support: Feet supported;Bilateral upper extremity supported Sitting balance-Leahy Scale: Fair Sitting balance - Comments: Min guard-supervision for sitting EOB. Difficulty bringing hands to face to scratch self. Assist to help pt wash face with wash cloth.   Standing balance support: During functional activity;Bilateral upper extremity supported Standing balance-Leahy Scale: Poor Standing balance comment: Requires external support for standing balance, stood for <7 sec.                             Cognition Arousal/Alertness: Awake/alert Behavior During Therapy: Flat affect Overall Cognitive Status: Impaired/Different from baseline Area of Impairment: Orientation;Attention;Problem solving;Following commands;Awareness                 Orientation Level: Disoriented to;Time;Situation Current Attention Level: Focused;Sustained   Following Commands: Follows one step commands inconsistently   Awareness: Intellectual   General Comments: Able to verbalize name. Follows simple 1 step commands with increased time ~75% of the time. Nods head at times to answer questions.      Exercises General Exercises - Lower Extremity Ankle Circles/Pumps: Both;10 reps;Supine;AROM Long Arc Quad: AAROM;Both;10 reps;Seated    General Comments General comments (skin integrity, edema, etc.): VSS throughout.      Pertinent Vitals/Pain Pain Assessment: Faces  Faces Pain Scale: Hurts even more Pain Location: pain in BLEs with movement Pain Descriptors / Indicators: Grimacing;Moaning Pain Intervention(s): Monitored during session;Repositioned    Home Living                      Prior Function            PT Goals (current goals can now  be found in the care plan section) Progress towards PT goals: Progressing toward goals    Frequency    Min 2X/week      PT Plan Current plan remains appropriate    Co-evaluation              AM-PAC PT "6 Clicks" Mobility   Outcome Measure  Help needed turning from your back to your side while in a flat bed without using bedrails?: A Lot Help needed moving from lying on your back to sitting on the side of a flat bed without using bedrails?: Total Help needed moving to and from a bed to a chair (including a wheelchair)?: Total Help needed standing up from a chair using your arms (e.g., wheelchair or bedside chair)?: Total Help needed to walk in hospital room?: Total Help needed climbing 3-5 steps with a railing? : Total 6 Click Score: 7    End of Session Equipment Utilized During Treatment: Gait belt;Oxygen Activity Tolerance: Patient limited by fatigue Patient left: in bed;with call bell/phone within reach;with bed alarm set Nurse Communication: Mobility status PT Visit Diagnosis: Muscle weakness (generalized) (M62.81);Unsteadiness on feet (R26.81);Difficulty in walking, not elsewhere classified (R26.2)     Time: 1336-1400 PT Time Calculation (min) (ACUTE ONLY): 24 min  Charges:  $Therapeutic Activity: 23-37 mins                     Wray Kearns, [place], DPT Acute Rehabilitation Services Pager [phone] Office [phone]       Marguarite Arbour A Sabra Heck 11/19/2018, 3:07 PM

## 2018-11-19 NOTE — Progress Notes (Signed)
   Patient seen, chart reviewed.  Met with patient's sister, Suanne Marker for further goals of care discussion.  Ms. Jimmye Norman confirms that her sister, Alletta, would not want to be put through CPR again and confirms that patient's current status is DNR  Patient currently has a core track, temporary feeding tube in place which I did discuss with Ms. Williams.  Patient will open her eyes to voice and attempts to mouth words but is not following commands consistently.  Discussed in detail with Mrs. Williams patient's current condition with not only severe heart failure with an ejection fraction of 20 to 25% in the setting of 2 V. tach arrests with potential downtime of 12 minutes, multiple myeloma with diffuse bone lesions, chronic kidney disease and atrial fib.  Patient's sister feels as though her sister, Aprel, would not want to live in a debilitated state and fears that that may be what she may be facing.  She describes her sister as very independent.  She is unmarried.  She had one son who died in his 49s.  She states she would like to talk to her other sister as well as nieces and nephews who are very close to Ms. Stoneham and discuss next steps specifically permanent feeding tube and facility placement to see if more time improves her status or residential hospice and feed for comfort if she is able to swallow  Palliative medicine to stay involved and monitor patient's condition and support patient and family with further goals of care discussion  Thank you for consultation, Romona Curls, NP Total time: 25 minutes Greater than 50% of this time was spent in counseling and coordination of care

## 2018-11-19 NOTE — Progress Notes (Signed)
[facility] TEAM 1 - Stepdown/ICU TEAM  Abigail [name]  PIR:[id number] DOB: 07/02/1946 DOA: 11/12/2018 PCP: Nolene Ebbs, MD    Brief Narrative:  73 year old womanwith a hx of myasthenia gravis, atrial fibrillation, prolonged QTC, CKD, multiple myeloma, and generalized FTT after a recent hospital admission for infectious colitis and septic shockwho was admitted1/23 after a VT/VF arrest with overall time to ROSC estimated at over 12 minutes. Hypothermia protocol was initiated. The arrest happened while she was on the phone and suddenly stopped talking, began to look offbut appeared to be breathing. EMS was called and on arrival she was found to be in a shockable rhythm. She was defibrillated three times and CPR was initiated. ROSC was initially accomplished in about 2 minutes. She was intubated by EMS. She suffered another cardiac arrest prior to arrival and again had ROSC in about 10 minutes. On arrival to the ED she was posturing with a right sided gaze preference.   Subjective: Resting comfortably in bed. No resp distress and no uncontrolled pain.   Assessment & Plan:  V. tach/V. fib arrest Cont amio and BB - seen by EP and not felt to be a candidate for pacemaker/AICD  Acute hypoxic respiratory failure - Acute systolic CHF  pulmonary edema due to new systolic cardiomyopathy with EF of 20-25% and renal failure - monitoring off diuretic at this time   Encephalopathy - multifactorial  Anoxia v/s metabolic - CT head January 24 showed Lytic lesions throughout the calvarium consistent with diffuse Myeloma   Multiple myeloma with diffuse lytic bone lesions Most recent zometa infusion 10/08/2017 - chemotherapy currently on hold  Myasthenia gravis Cont pyridostigmine via NG tube  Right lower leg hematoma and tenderness at tibial bone puncture site Empiric abx added by Cardiology today   Acute on chronic renal failure post cardiac arrest Cardiorenal syndrome versus ATN post cardiac  arrest - baseline creatinine ~1.4  Recent Labs  Lab 11/15/18 1608 11/16/18 0443 11/17/18 0529 11/18/18 0211 11/19/18 0557  CREATININE 1.64* 1.61* 1.64* 1.86* 1.44*    Acute RLE DVT / possible PE heparin stopped in view of anemia/heme positive stool   CAD s/p PTCA to LAD/LCx - did not undergo cardiac cath this admission  Acute on chronic multifactorial anemia baseline hemoglobin 8.5-9.5 - Guaiac positive stool - currently off blood thinners   Hypokalemia  Replace to goal of 4.0  Goals of Care Palliative Care working w/ family to make plan for future care    DVT prophylaxis: SCDs Code Status: DNR - NO CODE Family Communication: no family present at time of exam  Disposition Plan: SDU  Consultants:  Palliative Care PCCM Cardiology   Antimicrobials:  Cefazolin 1/31 >  Objective: Blood pressure (!) 140/109, pulse 68, temperature 97.7 F (36.5 C), temperature source Axillary, resp. rate 17, height '5\' 5"'  (1.651 m), weight 74.8 kg, SpO2 100 %.  Intake/Output Summary (Last 24 hours) at 11/19/2018 1548 Last data filed at 11/18/2018 2254 Gross per 24 hour  Intake 106.63 ml  Output -  Net 106.63 ml   Filed Weights   11/17/18 0530 11/18/18 0200 11/18/18 2017  Weight: 71.6 kg 73.1 kg 74.8 kg    Examination: General: No acute respiratory distress Lungs: Clear to auscultation bilaterally without wheezes or crackles Cardiovascular: Regular rate and rhythm without murmur  Abdomen: soft, bowel sounds positive, no mass Extremities: 1+ edema R LE - anterior tibial wound dressed and dry   CBC: Recent Labs  Lab 11/14/18 0400  11/17/18 0529  11/18/18 0211 11/18/18 1353 11/19/18 0557  WBC 16.5*   < > 16.8* 13.8*  --  14.2*  NEUTROABS 13.5*  --   --   --   --   --   HGB 9.4*   < > 8.1* 7.7* 7.9* 7.5*  HCT 31.2*   < > 26.6* 25.9* 25.8* 24.1*  MCV 100.3*   < > 98.9 99.2  --  97.2  PLT 226   < > 195 193  --  174   < > = values in this interval not displayed.   Basic  Metabolic Panel: Recent Labs  Lab 11/16/18 1700 11/17/18 0529 11/18/18 0211 11/18/18 0921 11/19/18 0557  NA  --  145 145  --  145  K  --  3.7 3.1*  --  3.0*  CL  --  119* 120*  --  118*  CO2  --  15* 16*  --  19*  GLUCOSE  --  96 127*  --  97  BUN  --  18 23  --  24*  CREATININE  --  1.64* 1.86*  --  1.44*  CALCIUM  --  9.2 9.0  --  8.2*  MG 1.8 2.4  --  2.0  --   PHOS 4.7* 5.2*  --  4.0  --    GFR: Estimated Creatinine Clearance: 36.3 mL/min (A) (by C-G formula based on SCr of 1.44 mg/dL (H)).  Liver Function Tests: Recent Labs  Lab 11/12/18 1905 11/14/18 0400 11/15/18 0931  AST 150* 50* 31  ALT 66* 38 31  ALKPHOS 66 66 83  BILITOT 1.3* 0.9 0.8  PROT 6.6 5.2* 5.3*  ALBUMIN 3.2* 2.4* 2.3*    Coagulation Profile: Recent Labs  Lab 11/12/18 1630 11/13/18 0617  INR 1.15 1.17    Cardiac Enzymes: Recent Labs  Lab 11/12/18 1630 11/12/18 1900 11/13/18 0136 11/13/18 0557  TROPONINI 0.65* 0.80* 0.92* 0.94*    HbA1C: Hgb A1c MFr Bld  Date/Time Value Ref Range Status  12/31/2011 06:41 AM 5.7 (H) <5.7 % Final    Comment:    (NOTE)                                                                       According to the ADA Clinical Practice Recommendations for 2011, when HbA1c is used as a screening test:  >=6.5%   Diagnostic of Diabetes Mellitus           (if abnormal result is confirmed) 5.7-6.4%   Increased risk of developing Diabetes Mellitus References:Diagnosis and Classification of Diabetes Mellitus,Diabetes PFXT,0240,97(DZHGD 1):S62-S69 and Standards of Medical Care in         Diabetes - 2011,Diabetes JMEQ,6834,19 (Suppl 1):S11-S61.    CBG: Recent Labs  Lab 11/18/18 2012 11/19/18 0018 11/19/18 0359 11/19/18 0750 11/19/18 1103  GLUCAP 122* 96 112* 89 95    Recent Results (from the past 240 hour(s))  MRSA PCR Screening     Status: None   Collection Time: 11/12/18  8:51 PM  Result Value Ref Range Status   MRSA by PCR NEGATIVE NEGATIVE Final      Comment:        The GeneXpert MRSA Assay (FDA approved for NASAL specimens only), is one component  of a comprehensive MRSA colonization surveillance program. It is not intended to diagnose MRSA infection nor to guide or monitor treatment for MRSA infections. Performed at [facility], [place] [address], [place], [place] [address]      Scheduled Meds: . amiodarone  200 mg Per Tube Daily  . atorvastatin  40 mg Oral q1800  . chlorhexidine  15 mL Mouth Rinse BID  . Chlorhexidine Gluconate Cloth  6 each Topical Daily  . feeding supplement (PRO-STAT SUGAR FREE 64)  30 mL Per Tube Daily  . insulin aspart  0-9 Units Subcutaneous Q4H  . mouth rinse  15 mL Mouth Rinse q12n4p  . metoprolol tartrate  12.5 mg Per Tube BID  . potassium chloride  20 mEq Per Tube TID  . pyridostigmine  30 mg Oral BID  . sodium chloride flush  10-40 mL Intracatheter Q12H  . sodium chloride flush  10-40 mL Intracatheter Q12H   Continuous Infusions: . sodium chloride 20 mL/hr at 11/18/18 1800  .  ceFAZolin (ANCEF) IV 1 g (11/19/18 1330)  . feeding supplement (OSMOLITE 1.2 CAL) 1,000 mL (11/19/18 0953)     LOS: 7 days   Cherene Altes, MD [facility] Office  [phone] Pager - Text Page per Amion  If 7PM-7AM, please contact night-coverage per Amion 11/19/2018, 3:48 PM

## 2018-11-19 NOTE — Care Management (Signed)
11-19-18 Patient was previously active with  [facility] for RN/PT/OT Services. PT recommendations for SNF. CM will continue to monitor. Bethena Roys, RN,BSN Case Manager

## 2018-11-19 NOTE — Progress Notes (Signed)
  Speech Language Pathology Treatment: Dysphagia  Patient Details Name: Abigail [name] MRN: [id number] DOB: 08-Mar-1946 Today's Date: 11/19/2018 Time: 1030-1040 SLP Time Calculation (min) (ACUTE ONLY): 10 min  Assessment / Plan / Recommendation Clinical Impression  Follow up at bedside after MBS completed yesterday (11/18/2018). Pt receiving TF via cortrak, and declined po trials with SLP. Safe swallow precautions reviewed with pt and updated at [facility] - thin liquids via cup or straw, full supervision during po intake. Will continue to assess diet tolerance and education. Note palliative care meeting with family scheduled for today.   HPI HPI: Pt is a 73 yo female admitted post VT/VF arrest x2, shocked 3x and intubated in the field with approximately 12 minutes of cardiac arrest prior to achieving ROSC.  Upon arrival to the ED she was posturing with a R sided gaze preference. ETT 1/24-1/27. PMH: GERD, schatzki's ring, multiple myeloma, FTT, recent hospitalization for infectious colitis and septic shock, myasthenia gravis, afib, CKD, MI, HTN, HLD, CAD      SLP Plan  Continue with current plan of care       Recommendations  Diet recommendations: Dysphagia 1 (puree);Thin liquid Liquids provided via: Cup;Straw Medication Administration: Crushed with puree Supervision: Staff to assist with self feeding;Full supervision/cueing for compensatory strategies Compensations: Minimize environmental distractions;Slow rate;Small sips/bites Postural Changes and/or Swallow Maneuvers: Seated upright 90 degrees;Upright 30-60 min after meal                Oral Care Recommendations: Oral care QID Follow up Recommendations: Skilled Nursing facility SLP Visit Diagnosis: Dysphagia, oral phase (R13.11) Plan: Continue with current plan of care       [place]. Quentin Ore [facility], CCC-SLP Speech Language Pathologist [phone]  Shonna Chock 11/19/2018, 10:41 AM

## 2018-11-19 NOTE — Social Work (Signed)
CSW called patient's sister, Abigail [name] and left voicemail requesting call back.  Noted palliative meeting today with Abigail [name], and Dorothy's plans to discuss with other family members.   CSW will follow for disposition needs and support.  Estanislado Emms, LCSW [phone]

## 2018-11-20 LAB — CBC
HCT: 20.8 % — ABNORMAL LOW (ref 36.0–46.0)
HCT: 23.8 % — ABNORMAL LOW (ref 36.0–46.0)
Hemoglobin: 6.3 g/dL — CL (ref 12.0–15.0)
Hemoglobin: 7.2 g/dL — ABNORMAL LOW (ref 12.0–15.0)
MCH: 30.9 pg (ref 26.0–34.0)
MCH: 30.6 pg (ref 26.0–34.0)
MCHC: 30.3 g/dL (ref 30.0–36.0)
MCHC: 30.3 g/dL (ref 30.0–36.0)
MCV: 102 fL — ABNORMAL HIGH (ref 80.0–100.0)
MCV: 101.3 fL — ABNORMAL HIGH (ref 80.0–100.0)
NRBC: 1.8 % — AB (ref 0.0–0.2)
NRBC: 1.7 % — AB (ref 0.0–0.2)
PLATELETS: 134 10*3/uL — AB (ref 150–400)
Platelets: 144 10*3/uL — ABNORMAL LOW (ref 150–400)
RBC: 2.04 MIL/uL — ABNORMAL LOW (ref 3.87–5.11)
RBC: 2.35 MIL/uL — ABNORMAL LOW (ref 3.87–5.11)
RDW: 20.8 % — ABNORMAL HIGH (ref 11.5–15.5)
RDW: 21 % — ABNORMAL HIGH (ref 11.5–15.5)
WBC: 11.5 10*3/uL — ABNORMAL HIGH (ref 4.0–10.5)
WBC: 13.2 10*3/uL — ABNORMAL HIGH (ref 4.0–10.5)

## 2018-11-20 LAB — BASIC METABOLIC PANEL
Anion gap: 7 (ref 5–15)
BUN: 20 mg/dL (ref 8–23)
CO2: 17 mmol/L — ABNORMAL LOW (ref 22–32)
Calcium: 7.4 mg/dL — ABNORMAL LOW (ref 8.9–10.3)
Chloride: 125 mmol/L — ABNORMAL HIGH (ref 98–111)
Creatinine, Ser: 1.28 mg/dL — ABNORMAL HIGH (ref 0.44–1.00)
GFR calc Af Amer: 49 mL/min — ABNORMAL LOW (ref >60)
GFR calc non Af Amer: 42 mL/min — ABNORMAL LOW (ref >60)
Glucose, Bld: 120 mg/dL — ABNORMAL HIGH (ref 70–99)
Potassium: 3.3 mmol/L — ABNORMAL LOW (ref 3.5–5.1)
Sodium: 149 mmol/L — ABNORMAL HIGH (ref 135–145)

## 2018-11-20 MED ORDER — POTASSIUM CHLORIDE 20 MEQ/15ML (10%) PO SOLN
40.0000 meq | Freq: Two times a day (BID) | ORAL | Status: DC
  Administered 2018-11-20 – 2018-11-21 (×4): 40 meq
  Filled 2018-11-20 (×4): qty 30

## 2018-11-20 MED ORDER — FREE WATER
200.0000 mL | Freq: Three times a day (TID) | Status: DC
  Administered 2018-11-20 – 2018-11-21 (×4): 200 mL

## 2018-11-20 NOTE — Progress Notes (Signed)
[facility] TEAM 1 - Stepdown/ICU TEAM  Abigail [name]  HUT:[id number] DOB: [birth year] DOA: 11/12/2018 PCP: Nolene Ebbs, MD    Brief Narrative:  73 year old womanwith a hx of myasthenia gravis, atrial fibrillation, prolonged QTC, CKD, multiple myeloma, and generalized FTT after a recent hospital admission for infectious colitis and septic shockwho was admitted1/23 after a VT/VF arrest with overall time to ROSC estimated at over 12 minutes. Hypothermia protocol was initiated. The arrest happened while she was on the phone and suddenly stopped talking, began to look offbut appeared to be breathing. EMS was called and on arrival she was found to be in a shockable rhythm. She was defibrillated three times and CPR was initiated. ROSC was initially accomplished in about 2 minutes. She was intubated by EMS. She suffered another cardiac arrest prior to arrival and again had ROSC in about 10 minutes. On arrival to the ED she was posturing with a right sided gaze preference.   Subjective: The patient is more alert at the time of my exam today, but remains confused.  She will answer some questions with very short answers and at other times will just look at me.  She does follow the examiner around the room.  She cannot presently provide a detailed review of systems however.  There is no family present at the time of my exam.  Assessment & Plan:  V. tach/V. fib arrest Cont amio and BB - seen by EP and not felt to be a candidate for pacemaker/AICD  Acute hypoxic respiratory failure - Acute systolic CHF  pulmonary edema due to new systolic cardiomyopathy with EF of 20-25% and renal failure - monitoring off diuretic at this time - no gross volume overload on exam today  Filed Weights   11/18/18 0200 11/18/18 2017 11/20/18 0420  Weight: 73.1 kg 74.8 kg 73.5 kg    Encephalopathy - multifactorial  Anoxia v/s metabolic - CT head January 24 showed Lytic lesions throughout the calvarium consistent with diffuse  Myeloma -more alert today but remains confused  Multiple myeloma with diffuse lytic bone lesions Most recent zometa infusion 10/08/2017 - chemotherapy currently on hold  Myasthenia gravis Cont pyridostigmine via NG tube  Right lower leg hematoma and tenderness at tibial bone puncture site Empiric abx added by Cardiology - patient remains afebrile  Acute on chronic renal failure post cardiac arrest Cardiorenal syndrome versus ATN post cardiac arrest - baseline creatinine ~1.4 -renal function now stable at her baseline  Recent Labs  Lab 11/16/18 0443 11/17/18 0529 11/18/18 0211 11/19/18 0557 11/20/18 0530  CREATININE 1.61* 1.64* 1.86* 1.44* 1.28*    Acute RLE DVT / possible PE heparin stopped in view of anemia/heme positive stool  Macrocytic anemia - Acute on chronic multifactorial anemia likely multifactorial -no gross evidence of blood loss currently but has had positive stool testing this admission -all anticoagulation on hold -follow trend -avoid transfusion if possible while family determining ultimate goals of care - baseline hemoglobin 8.5-9.5   CAD s/p PTCA to LAD/LCx - did not undergo cardiac cath this admission  Hypokalemia  Replace to goal of 4.0  Goals of Care Palliative Care working w/ family to make plan for future care - comfort focused/hospice care is being considered   DVT prophylaxis: SCDs Code Status: DNR - NO CODE Family Communication: no family present at time of exam  Disposition Plan: SDU  Consultants:  Palliative Care PCCM Cardiology   Antimicrobials:  Cefazolin 1/31 >  Objective: Blood pressure 134/60, pulse Marland Kitchen)  56, temperature (!) 97.4 F (36.3 C), temperature source Axillary, resp. rate (!) 22, height _0  (1.651 m), weight 73.5 kg, SpO2 100 %.  Intake/Output Summary (Last 24 hours) at 11/20/2018 1408 Last data filed at 11/20/2018 0600 Gross per 24 hour  Intake 100 ml  Output 1050 ml  Net -950 ml   Filed Weights   11/18/18  0200 11/18/18 2017 11/20/18 0420  Weight: 73.1 kg 74.8 kg 73.5 kg    Examination: General: No acute respiratory distress - awake but confused  Lungs: mild bibasilar crackles - no wheezing  Cardiovascular: RRR  Abdomen: soft, bowel sounds positive, no mass Extremities: 1+ edema R LE - anterior tibial wound dressed and dry   CBC: Recent Labs  Lab 11/14/18 0400  11/19/18 0557 11/20/18 0530 11/20/18 1035  WBC 16.5*   < > 14.2* 11.5* 13.2*  NEUTROABS 13.5*  --   --   --   --   HGB 9.4*   < > 7.5* 6.3* 7.2*  HCT 31.2*   < > 24.1* 20.8* 23.8*  MCV 100.3*   < > 97.2 102.0* 101.3*  PLT 226   < > 174 134* 144*   < > = values in this interval not displayed.   Basic Metabolic Panel: Recent Labs  Lab 11/16/18 1700 11/17/18 0529 11/18/18 0211 11/18/18 0921 11/19/18 0557 11/20/18 0530  NA  --  145 145  --  145 149*  K  --  3.7 3.1*  --  3.0* 3.3*  CL  --  119* 120*  --  118* 125*  CO2  --  15* 16*  --  19* 17*  GLUCOSE  --  96 127*  --  97 120*  BUN  --  18 23  --  24* 20  CREATININE  --  1.64* 1.86*  --  1.44* 1.28*  CALCIUM  --  9.2 9.0  --  8.2* 7.4*  MG 1.8 2.4  --  2.0  --   --   PHOS 4.7* 5.2*  --  4.0  --   --    GFR: Estimated Creatinine Clearance: 40.5 mL/min (A) (by C-G formula based on SCr of 1.28 mg/dL (H)).  Liver Function Tests: Recent Labs  Lab 11/14/18 0400 11/15/18 0931  AST 50* 31  ALT 38 31  ALKPHOS 66 83  BILITOT 0.9 0.8  PROT 5.2* 5.3*  ALBUMIN 2.4* 2.3*    HbA1C: Hgb A1c MFr Bld  Date/Time Value Ref Range Status  12/31/2011 06:41 AM 5.7 (H) <5.7 % Final    Comment:    (NOTE)                                                                       According to the ADA Clinical Practice Recommendations for 2011, when HbA1c is used as a screening test:  >=6.5%   Diagnostic of Diabetes Mellitus           (if abnormal result is confirmed) 5.7-6.4%   Increased risk of developing Diabetes Mellitus References:Diagnosis and Classification of  Diabetes Mellitus,Diabetes DUKG,2542,70(WCBJS 1):S62-S69 and Standards of Medical Care in         Diabetes - 2011,Diabetes Care,2011,34 (Suppl 1):S11-S61.    CBG: Recent Labs  Lab  11/18/18 2012 11/19/18 0018 11/19/18 0359 11/19/18 0750 11/19/18 1103  GLUCAP 122* 96 112* 89 95    Recent Results (from the past 240 hour(s))  MRSA PCR Screening     Status: None   Collection Time: 11/12/18  8:51 PM  Result Value Ref Range Status   MRSA by PCR NEGATIVE NEGATIVE Final    Comment:        The GeneXpert MRSA Assay (FDA approved for NASAL specimens only), is one component of a comprehensive MRSA colonization surveillance program. It is not intended to diagnose MRSA infection nor to guide or monitor treatment for MRSA infections. Performed at [facility], [place] [address] [place], [place] [address]      Scheduled Meds: . amiodarone  200 mg Per Tube Daily  . atorvastatin  40 mg Per Tube q1800  . chlorhexidine  15 mL Mouth Rinse BID  . Chlorhexidine Gluconate Cloth  6 each Topical Daily  . feeding supplement (PRO-STAT SUGAR FREE 64)  30 mL Per Tube Daily  . mouth rinse  15 mL Mouth Rinse q12n4p  . metoprolol tartrate  12.5 mg Per Tube BID  . potassium chloride  20 mEq Per Tube TID  . pyridostigmine  30 mg Per Tube BID  . sodium chloride flush  10-40 mL Intracatheter Q12H     LOS: 8 days   Cherene Altes, MD [facility] Office  [phone] Pager - Text Page per Shea Evans  If 7PM-7AM, please contact night-coverage per Amion 11/20/2018, 2:08 PM

## 2018-11-20 NOTE — Progress Notes (Addendum)
CRITICAL VALUE ALERT  Critical Value: Hgb 6.3  Date & Time Notied:  11/20/2018 @ 0646  Provider Notified:Blount X, NP  Orders Received/Actions taken: None

## 2018-11-20 NOTE — Progress Notes (Signed)
Subjective:  Patient more alert and awake today as per nursing staff.  Denies any chest pain or shortness of breath.  Objective:  Vital Signs in the last 24 hours: Temp:  [97.7 F (36.5 C)-98.8 F (37.1 C)] 98.4 F (36.9 C) (02/01 0802) Pulse Rate:  [25-68] 25 (02/01 0802) Resp:  [15-19] 19 (02/01 0802) BP: (106-162)/(59-109) 162/62 (02/01 0802) SpO2:  [96 %-100 %] 100 % (02/01 0802) Weight:  [73.5 kg] 73.5 kg (02/01 0420)  Intake/Output from previous day: 01/31 0701 - 02/01 0700 In: 271 [P.O.:211; NG/GT:60] Out: 1050 [Urine:950; Stool:100] Intake/Output from this shift: No intake/output data recorded.  Physical Exam: Neck: no adenopathy, no carotid bruit, no JVD and supple, symmetrical, trachea midline Lungs: Decreased breath sound at bases Heart: regular rate and rhythm, S1, S2 normal and 2/6 systolic murmur noted Abdomen: soft, non-tender; bowel sounds normal; no masses,  no organomegaly Extremities: extremities normal, atraumatic, no cyanosis or edema and Right leg 2+ edema noted  Lab Results: Recent Labs    11/19/18 0557 11/20/18 0530  WBC 14.2* 11.5*  HGB 7.5* 6.3*  PLT 174 134*   Recent Labs    11/19/18 0557 11/20/18 0530  NA 145 149*  K 3.0* 3.3*  CL 118* 125*  CO2 19* 17*  GLUCOSE 97 120*  BUN 24* 20  CREATININE 1.44* 1.28*   No results for input(s): TROPONINI in the last 72 hours.  Invalid input(s): CK, MB Hepatic Function Panel No results for input(s): PROT, ALBUMIN, AST, ALT, ALKPHOS, BILITOT, BILIDIR, IBILI in the last 72 hours. No results for input(s): CHOL in the last 72 hours. No results for input(s): PROTIME in the last 72 hours.  Imaging: Imaging results have been reviewed and Dg Swallowing Func-speech Pathology  Result Date: 11/18/2018 Objective Swallowing Evaluation: Type of Study: MBS-Modified Barium Swallow Study Completed by Ellwood Dense, SLP Student, Supervised in line of sight and reviewed by Herbie [place] Patient  Details Name: Abigail [name] MRN: [id number] Date of Birth: 1946/02/07 Today's Date: 11/18/2018 Time: SLP Start Time (ACUTE ONLY): 0130 -SLP Stop Time (ACUTE ONLY): 0200 SLP Time Calculation (min) (ACUTE ONLY): 30 min Past Medical History: Past Medical History: Diagnosis Date . ACS (acute coronary syndrome) ([place]) 06/2017 . Anemia   "off and on all my life" (12/22/2016) . Anxiety  . Bilateral renal artery stenosis ([place]) 1999  s/p stenting . Chronic kidney disease (CKD), stage III (moderate) ([place])   Archie Endo 12/22/2016 . Coronary artery disease 1999 . Depression  . Diverticulosis  . Duodenitis  . DVT (deep venous thrombosis) ([facility])   on Coumadin . Dyspnea  . Gastric erosions  . Gastric ulcer  . GERD (gastroesophageal reflux disease)  . Heart murmur  . History of blood transfusion ~ 03/2016  "low HgB; practically nonexistent" . Hyperlipidemia  . Hypertension  . MI (myocardial infarction) ([place]) 1999 . Multiple myeloma ([place]) dx'd ~ 04/2016 . PAT (paroxysmal atrial tachycardia) ([place]) 12/22/2016 . Retinal hemorrhage, right eye  . Schatzki's ring  . Tachy-brady syndrome ([place])   Archie Endo 12/22/2016 . Tubular adenoma of colon  Past Surgical History: Past Surgical History: Procedure Laterality Date . APPENDECTOMY   . BACK SURGERY   . CATARACT EXTRACTION W/ INTRAOCULAR LENS IMPLANT Right 2014 . COLONOSCOPY WITH PROPOFOL N/A 09/04/2017  Procedure: COLONOSCOPY WITH PROPOFOL;  Surgeon: Doran Stabler, MD;  Location: [facility];  Service: Gastroenterology;  Laterality: N/A; . CORONARY ANGIOGRAPHY N/A 01/23/2017  Procedure: Coronary Angiography;  Surgeon: [name], MD;  Location: [facility]  CV LAB;  Service: Cardiovascular;  Laterality: N/A; . CORONARY ANGIOPLASTY WITH STENT PLACEMENT  1999 . CORONARY STENT INTERVENTION N/A 12/29/2016  Procedure: Coronary Stent Intervention;  Surgeon: Charolette Forward, MD;  Location: [facility] CV LAB;  Service: Cardiovascular;  Laterality: N/A; . CORONARY STENT INTERVENTION N/A 02/24/2017  Procedure: Coronary Stent  Intervention;  Surgeon: Charolette Forward, MD;  Location: [facility] LAB;  Service: Cardiovascular;  Laterality: N/A; . ESOPHAGOGASTRODUODENOSCOPY N/A 11/03/2015  Procedure: ESOPHAGOGASTRODUODENOSCOPY (EGD);  Surgeon: Carol Ada, MD;  Location[facility] ENDOSCOPY;  Service: Endoscopy;  Laterality: N/A; . ESOPHAGOGASTRODUODENOSCOPY N/A 09/03/2017  Procedure: ESOPHAGOGASTRODUODENOSCOPY (EGD);  Surgeon: Doran Stabler, MD;  Location: [facility];  Service: Gastroenterology;  Laterality: N/A; . ESOPHAGOGASTRODUODENOSCOPY (EGD) WITH PROPOFOL N/A 11/18/2016  Procedure: ESOPHAGOGASTRODUODENOSCOPY (EGD) WITH PROPOFOL;  Surgeon: Ladene Artist, MD;  Location: [facility] ENDOSCOPY;  Service: Endoscopy;  Laterality: N/A; . LEFT HEART CATH AND CORONARY ANGIOGRAPHY N/A 12/24/2016  Procedure: Left Heart Cath and Coronary Angiography;  Surgeon: [name], MD;  Location: [place] CV LAB;  Service: Cardiovascular;  Laterality: N/A; . LEFT HEART CATH AND CORONARY ANGIOGRAPHY N/A 02/24/2017  Procedure: Left Heart Cath and Coronary Angiography;  Surgeon: Charolette Forward, MD;  Location: [place] CV LAB;  Service: Cardiovascular;  Laterality: N/A; . LEFT HEART CATH AND CORONARY ANGIOGRAPHY N/A 03/05/2017  Procedure: Left Heart Cath and Coronary Angiography;  Surgeon: [name], MD;  Location: [place] CV LAB;  Service: Cardiovascular;  Laterality: N/A; . LEFT HEART CATH AND CORONARY ANGIOGRAPHY N/A 09/02/2018  Procedure: LEFT HEART CATH AND CORONARY ANGIOGRAPHY;  Surgeon: [name], MD;  Location: [place] CV LAB;  Service: Cardiovascular;  Laterality: N/A; . LUMBAR [place]   . RENAL ARTERY STENT  1999  bil RAS so ? bil vs unilateral stents . RETINAL LASER PROCEDURE Right 2012  "bleeding" . TONSILLECTOMY   . TOTAL ABDOMINAL HYSTERECTOMY   HPI: Pt is a 73 yo female admitted post VT/VF arrest x2, shocked 3x and intubated in the field with approximately 12 minutes of cardiac arrest prior to achieving ROSC.  Upon arrival to  the ED she was posturing with a R sided gaze preference. ETT 1/24-1/27. PMH: GERD, schatzki's ring, multiple myeloma, FTT, recent hospitalization for infectious colitis and septic shock, myasthenia gravis, afib, CKD, MI, HTN, HLD, CAD  Subjective: Pt confused but responsive but with cues Assessment / Plan / Recommendation CHL IP CLINICAL IMPRESSIONS 11/18/2018 Clinical Impression Pt presents with cognitive based oral dysphagia c/b significant oral holding and some premature spillage. Oral holding of thin liquid and puree observed, but eventually pt swallowed, demonstrating strength in tongue-base retraction and hyolaryngeal excursion for airway protection. One instance of premature spillage of thin liquid noted, which resulted in flash penetration with immediate ejection of bolus. Mechanical soft PO held in oral cavity until it had be physically removed, even after max cues to masticate. Some coughing noted in between POs, however with fluoroscopy turned on there was no evidence of oral or pharyngeal cause. Recommend puree, thin diet with meds crushed in puree. Will f/u for consistent oral intake and tolerance of diet with improved mentation. SLP Visit Diagnosis Dysphagia, oral phase (R13.11) Attention and concentration deficit following -- Frontal lobe and executive function deficit following -- Impact on safety and function --   CHL IP TREATMENT RECOMMENDATION 11/18/2018 Treatment Recommendations Therapy as outlined in treatment plan below   Prognosis 11/18/2018 Prognosis for Safe Diet Advancement Good Barriers to Reach Goals Cognitive deficits Barriers/Prognosis Comment -- CHL IP DIET  RECOMMENDATION 11/18/2018 SLP Diet Recommendations Dysphagia 1 (Puree) solids;Thin liquid Liquid Administration via Straw;Cup Medication Administration Crushed with puree Compensations -- Postural Changes --   CHL IP OTHER RECOMMENDATIONS 11/18/2018 Recommended Consults -- Oral Care Recommendations Oral care QID Other Recommendations  Have oral suction available   CHL IP FOLLOW UP RECOMMENDATIONS 11/18/2018 Follow up Recommendations Skilled Nursing facility   [facility] IP FREQUENCY AND DURATION 11/18/2018 Speech Therapy Frequency (ACUTE ONLY) min 2x/week Treatment Duration 2 weeks      CHL IP ORAL PHASE 11/18/2018 Oral Phase Impaired Oral - Pudding Teaspoon -- Oral - Pudding Cup -- Oral - Honey Teaspoon -- Oral - Honey Cup -- Oral - Nectar Teaspoon -- Oral - Nectar Cup -- Oral - Nectar Straw -- Oral - Thin Teaspoon -- Oral - Thin Cup Holding of bolus Oral - Thin Straw Holding of bolus Oral - Puree Holding of bolus Oral - Mech Soft Holding of bolus Oral - Regular -- Oral - Multi-Consistency -- Oral - Pill -- Oral Phase - Comment --  CHL IP PHARYNGEAL PHASE 11/18/2018 Pharyngeal Phase WFL Pharyngeal- Pudding Teaspoon -- Pharyngeal -- Pharyngeal- Pudding Cup -- Pharyngeal -- Pharyngeal- Honey Teaspoon -- Pharyngeal -- Pharyngeal- Honey Cup -- Pharyngeal -- Pharyngeal- Nectar Teaspoon -- Pharyngeal -- Pharyngeal- Nectar Cup -- Pharyngeal -- Pharyngeal- Nectar Straw -- Pharyngeal -- Pharyngeal- Thin Teaspoon -- Pharyngeal -- Pharyngeal- Thin Cup -- Pharyngeal -- Pharyngeal- Thin Straw -- Pharyngeal -- Pharyngeal- Puree -- Pharyngeal -- Pharyngeal- Mechanical Soft -- Pharyngeal -- Pharyngeal- Regular -- Pharyngeal -- Pharyngeal- Multi-consistency -- Pharyngeal -- Pharyngeal- Pill -- Pharyngeal -- Pharyngeal Comment --  CHL IP CERVICAL ESOPHAGEAL PHASE 11/18/2018 Cervical Esophageal Phase WFL Pudding Teaspoon -- Pudding Cup -- Honey Teaspoon -- Honey Cup -- Nectar Teaspoon -- Nectar Cup -- Nectar Straw -- Thin Teaspoon -- Thin Cup -- Thin Straw -- Puree -- Mechanical Soft -- Regular -- Multi-consistency -- Pill -- Cervical Esophageal Comment -- Lynann Beaver 11/18/2018, 3:04 PM              [place] Ekg Site Rite  Result Date: 11/18/2018 If Site Rite image not attached, placement could not be confirmed due to current cardiac rhythm.   Cardiac  Studies:  Assessment/Plan:  Status post V.fib cardiac arrest Status post acute respiratory failure Resolving systolic left heart failure Possible pneumonia Possible PE Acute right leg DVT Multiple myeloma Acute on chronic blood loss anemia. Acute renal failure Hypokalemia Depression CAD Stents in LAD and LCx Right lower leg hematoma and tenderness ar tibial bone puncture site. Plan Continue present management Palliative care working with family regarding goals of care Packed RBCs blood transfusion as per PMD in consultation with family  LOS: 8 days    Charolette Forward 11/20/2018, 9:59 AM

## 2018-11-21 ENCOUNTER — Other Ambulatory Visit: Payer: Self-pay

## 2018-11-21 LAB — COMPREHENSIVE METABOLIC PANEL
ALT: 11 U/L (ref 0–44)
AST: 17 U/L (ref 15–41)
Albumin: 2.2 g/dL — ABNORMAL LOW (ref 3.5–5.0)
Alkaline Phosphatase: 78 U/L (ref 38–126)
Anion gap: 9 (ref 5–15)
BUN: 20 mg/dL (ref 8–23)
CHLORIDE: 121 mmol/L — AB (ref 98–111)
CO2: 19 mmol/L — ABNORMAL LOW (ref 22–32)
Calcium: 8.1 mg/dL — ABNORMAL LOW (ref 8.9–10.3)
Creatinine, Ser: 1.17 mg/dL — ABNORMAL HIGH (ref 0.44–1.00)
GFR calc Af Amer: 54 mL/min — ABNORMAL LOW (ref >60)
GFR, EST NON AFRICAN AMERICAN: 47 mL/min — AB (ref >60)
Glucose, Bld: 116 mg/dL — ABNORMAL HIGH (ref 70–99)
Potassium: 4.9 mmol/L (ref 3.5–5.1)
Sodium: 149 mmol/L — ABNORMAL HIGH (ref 135–145)
Total Bilirubin: 0.3 mg/dL (ref 0.3–1.2)
Total Protein: 5 g/dL — ABNORMAL LOW (ref 6.5–8.1)

## 2018-11-21 LAB — CBC
HCT: 23.3 % — ABNORMAL LOW (ref 36.0–46.0)
Hemoglobin: 6.8 g/dL — CL (ref 12.0–15.0)
MCH: 29.4 pg (ref 26.0–34.0)
MCHC: 29.2 g/dL — ABNORMAL LOW (ref 30.0–36.0)
MCV: 100.9 fL — ABNORMAL HIGH (ref 80.0–100.0)
PLATELETS: 159 10*3/uL (ref 150–400)
RBC: 2.31 MIL/uL — ABNORMAL LOW (ref 3.87–5.11)
RDW: 21.2 % — ABNORMAL HIGH (ref 11.5–15.5)
WBC: 14.1 10*3/uL — ABNORMAL HIGH (ref 4.0–10.5)
nRBC: 0.6 % — ABNORMAL HIGH (ref 0.0–0.2)

## 2018-11-21 MED ORDER — TRAMADOL HCL 50 MG PO TABS
50.0000 mg | ORAL_TABLET | Freq: Four times a day (QID) | ORAL | Status: DC | PRN
  Administered 2018-11-27 – 2018-12-02 (×3): 50 mg
  Filled 2018-11-21 (×3): qty 1

## 2018-11-21 MED ORDER — FREE WATER
200.0000 mL | Freq: Four times a day (QID) | Status: DC
  Administered 2018-11-21 – 2018-11-22 (×4): 200 mL

## 2018-11-21 MED ORDER — OXYCODONE HCL 5 MG PO TABS
5.0000 mg | ORAL_TABLET | ORAL | Status: DC | PRN
  Administered 2018-11-21: 5 mg via ORAL
  Filled 2018-11-21: qty 1

## 2018-11-21 MED ORDER — TRAMADOL HCL 50 MG PO TABS: 50.0000 mg | ORAL_TABLET | Freq: Four times a day (QID) | ORAL | Status: DC | PRN

## 2018-11-21 MED ORDER — OXYCODONE HCL 5 MG PO TABS
5.0000 mg | ORAL_TABLET | ORAL | Status: DC | PRN
  Administered 2018-11-21: 10 mg
  Administered 2018-11-21: 5 mg
  Administered 2018-11-22 – 2018-11-23 (×7): 10 mg
  Administered 2018-11-25: 5 mg
  Administered 2018-11-25: 10 mg
  Administered 2018-11-26: 5 mg
  Administered 2018-11-26 – 2018-11-29 (×7): 10 mg
  Administered 2018-11-30 – 2018-12-01 (×3): 5 mg
  Filled 2018-11-21: qty 1
  Filled 2018-11-21 (×6): qty 2
  Filled 2018-11-21 (×2): qty 1
  Filled 2018-11-21 (×8): qty 2
  Filled 2018-11-21: qty 1
  Filled 2018-11-21 (×2): qty 2
  Filled 2018-11-21: qty 1
  Filled 2018-11-21 (×2): qty 2

## 2018-11-21 NOTE — Progress Notes (Signed)
Subjective:  Patient denies any complaints. Objective:  Vital Signs in the last 24 hours: Temp:  [97.4 F (36.3 C)-98.9 F (37.2 C)] 98.4 F (36.9 C) (02/02 0804) Pulse Rate:  [56-63] 60 (02/02 0804) Resp:  [17-22] 17 (02/02 0804) BP: (122-148)/(60-105) 122/88 (02/02 0918) SpO2:  [98 %-100 %] 99 % (02/02 0804) Weight:  [74.3 kg] 74.3 kg (02/02 0358)  Intake/Output from previous day: 02/01 0701 - 02/02 0700 In: 960 [P.O.:240; NG/GT:720] Out: 650 [Urine:300; Stool:350] Intake/Output from this shift: Total I/O In: 120 [P.O.:120] Out: 300 [Urine:300]  Physical Exam: Neck: no adenopathy, no carotid bruit, no JVD and supple, symmetrical, trachea midline Lungs: Decreased breath sound at bases Heart: regular rate and rhythm, S1, S2 normal and 2/6 systolic murmur noted Abdomen soft nontender bowel sounds present Extremities no clubbing cyanosis 1+ edema right noted Lab Results: Recent Labs    11/20/18 1035 11/21/18 0430  WBC 13.2* 14.1*  HGB 7.2* 6.8*  PLT 144* 159   Recent Labs    11/20/18 0530 11/21/18 0430  NA 149* 149*  K 3.3* 4.9  CL 125* 121*  CO2 17* 19*  GLUCOSE 120* 116*  BUN 20 20  CREATININE 1.28* 1.17*   No results for input(s): TROPONINI in the last 72 hours.  Invalid input(s): CK, MB Hepatic Function Panel Recent Labs    11/21/18 0430  PROT 5.0*  ALBUMIN 2.2*  AST 17  ALT 11  ALKPHOS 78  BILITOT 0.3   No results for input(s): CHOL in the last 72 hours. No results for input(s): PROTIME in the last 72 hours.  Imaging: Imaging results have been reviewed and No results found.  Cardiac Studies:  Assessment/Plan:  Status post V.fib cardiac arrest Status post acute respiratory failure Resolving systolic left heart failure Possible pneumonia Possible PE Acute right leg DVT Multiple myeloma Acute on chronic blood loss anemia. Status post acute renal failure Status post hypokalemia Depression CAD Stents in LAD and LCx Right lower leg  hematoma and tenderness ar tibial bone puncture site. Plan Continue present management  Palliative care working with family regarding goals of care.  LOS: 9 days    Charolette Forward 11/21/2018, 9:31 AM

## 2018-11-21 NOTE — Progress Notes (Signed)
[place] [facility] TEAM 1 - Stepdown/ICU TEAM  Abigail [name]  YOK:[id number] DOB: 1945-10-22 DOA: 11/12/2018 PCP: Nolene Ebbs, MD    Brief Narrative:  73 year old womanwith a hx of myasthenia gravis, atrial fibrillation, prolonged QTC, CKD, multiple myeloma, and generalized FTT after a recent hospital admission for infectious colitis and septic shockwho was admitted1/23 after a VT/VF arrest with overall time to ROSC estimated at over 12 minutes. Hypothermia protocol was initiated. The arrest happened while she was on the phone and suddenly stopped talking, began to look offbut appeared to be breathing. EMS was called and on arrival she was found to be in a shockable rhythm. She was defibrillated three times and CPR was initiated. ROSC was initially accomplished in about 2 minutes. She was intubated by EMS. She suffered another cardiac arrest prior to arrival and again had ROSC in about 10 minutes. On arrival to the ED she was posturing with a right sided gaze preference.  1/24 admit by PCCM   Subjective: Hgb remains right at transfusion threshold. Awaiting GOC which are to be established by [facility] and family.  The patient appears to be without significant change yesterday.  She continues to be alert and interactive but is confused.  She can answer very simple questions but cannot provide a detailed review of systems.  Assessment & Plan:  V. Tach / V. 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% and renal failure - monitoring off diuretic at this time - no gross volume overload on exam - stable on room air at this time  [facility] Weights   11/18/18 2017 11/20/18 0420 11/21/18 0358  Weight: 74.8 kg 73.5  kg 74.3 kg    Encephalopathy - multifactorial  Anoxia v/s metabolic - CT head January 24 showed Lytic lesions throughout the calvarium consistent with diffuse Myeloma   Multiple myeloma with diffuse lytic bone lesions Most recent zometa infusion 10/08/2017 - chemotherapy currently on hold  Myasthenia gravis Cont pyridostigmine via NG tube  Right lower leg hematoma and tenderness at tibial bone puncture site Empiric abx added by Cardiology - remains afebrile  Acute on chronic renal failure post cardiac arrest Cardiorenal syndrome versus ATN post cardiac arrest - baseline creatinine ~1.4 -renal function now stable at her baseline  Recent Labs  Lab 11/17/18 0529 11/18/18 0211 11/19/18 0557 11/20/18 0530 11/21/18 0430  CREATININE 1.64* 1.86* 1.44* 1.28* 1.17*    Acute RLE DVT / possible PE heparin stopped in view of anemia/heme positive stool  Macrocytic anemia - Acute on chronic multifactorial anemia likely multifactorial -no gross evidence of blood loss currently but has had positive stool testing this admission -all anticoagulation on hold -follow trend -avoid transfusion if possible while family determining ultimate goals of care - baseline hemoglobin 8.5-9.5   CAD s/p PTCA to LAD/LCx - did not undergo cardiac cath this admission  Hypokalemia  Replaced to goal of 4.0  Goals of Care Palliative Care working w/ family to make plan for future care - comfort focused/hospice care is being considered   DVT prophylaxis: SCDs Code Status: DNR - NO CODE Family Communication: no family present at time of exam  Disposition  Plan: SDU  Consultants:  Palliative Care PCCM Cardiology   Antimicrobials:  Cefazolin 1/31 >  Objective: Blood pressure (!) 134/59, pulse 64, temperature 98.5 F (36.9 C), temperature source Axillary, resp. rate 19, height '5\' 5"'  (1.651 m), weight 74.3 kg, SpO2 99 %.  Intake/Output Summary (Last 24 hours) at 11/21/2018 1406 Last data filed at  11/21/2018 1000 Gross per 24 hour  Intake 2144.56 ml  Output 600 ml  Net 1544.56 ml   Filed Weights   11/18/18 2017 11/20/18 0420 11/21/18 0358  Weight: 74.8 kg 73.5 kg 74.3 kg    Examination: General: No acute respiratory distress Lungs: mild bibasilar crackles Cardiovascular: RRR  Abdomen: soft, bowel sounds positive, no rebound  Extremities: 1+ edema R LE - anterior tibial wound dressed/dry   CBC: Recent Labs  Lab 11/20/18 0530 11/20/18 1035 11/21/18 0430  WBC 11.5* 13.2* 14.1*  HGB 6.3* 7.2* 6.8*  HCT 20.8* 23.8* 23.3*  MCV 102.0* 101.3* 100.9*  PLT 134* 144* 102   Basic Metabolic Panel: Recent Labs  Lab 11/16/18 1700 11/17/18 0529  11/18/18 0921 11/19/18 0557 11/20/18 0530 11/21/18 0430  NA  --  145   < >  --  145 149* 149*  K  --  3.7   < >  --  3.0* 3.3* 4.9  CL  --  119*   < >  --  118* 125* 121*  CO2  --  15*   < >  --  19* 17* 19*  GLUCOSE  --  96   < >  --  97 120* 116*  BUN  --  18   < >  --  24* 20 20  CREATININE  --  1.64*   < >  --  1.44* 1.28* 1.17*  CALCIUM  --  9.2   < >  --  8.2* 7.4* 8.1*  MG 1.8 2.4  --  2.0  --   --   --   PHOS 4.7* 5.2*  --  4.0  --   --   --    < > = values in this interval not displayed.   GFR: Estimated Creatinine Clearance: 44.5 mL/min (A) (by C-G formula based on SCr of 1.17 mg/dL (H)).  Liver Function Tests: Recent Labs  Lab 11/15/18 0931 11/21/18 0430  AST 31 17  ALT 31 11  ALKPHOS 83 78  BILITOT 0.8 0.3  PROT 5.3* 5.0*  ALBUMIN 2.3* 2.2*    HbA1C: Hgb A1c MFr Bld  Date/Time Value Ref Range Status  12/31/2011 06:41 AM 5.7 (H) <5.7 % Final    Comment:    (NOTE)                                                                       According to the ADA Clinical Practice Recommendations for 2011, when HbA1c is used as a screening test:  >=6.5%   Diagnostic of Diabetes Mellitus           (if abnormal result is confirmed) 5.7-6.4%   Increased risk of developing Diabetes Mellitus References:Diagnosis  and Classification of Diabetes Mellitus,Diabetes VOZD,6644,03(KVQQV 1):S62-S69 and Standards of Medical Care in         Diabetes - 2011,Diabetes ZDGL,8756,43 (Suppl 1):S11-S61.  CBG: Recent Labs  Lab 11/18/18 2012 11/19/18 0018 11/19/18 0359 11/19/18 0750 11/19/18 1103  GLUCAP 122* 96 112* 89 95    Recent Results (from the past 240 hour(s))  MRSA PCR Screening     Status: None   Collection Time: 11/12/18  8:51 PM  Result Value Ref Range Status   MRSA by PCR NEGATIVE NEGATIVE Final    Comment:        The GeneXpert MRSA Assay (FDA approved for NASAL specimens only), is one component of a comprehensive MRSA colonization surveillance program. It is not intended to diagnose MRSA infection nor to guide or monitor treatment for MRSA infections. Performed at [facility], [place] [address], [place], [place] [address]      Scheduled Meds: . amiodarone  200 mg Per Tube Daily  . atorvastatin  40 mg Per Tube q1800  . chlorhexidine  15 mL Mouth Rinse BID  . Chlorhexidine Gluconate Cloth  6 each Topical Daily  . feeding supplement (PRO-STAT SUGAR FREE 64)  30 mL Per Tube Daily  . free water  200 mL Per Tube Q8H  . mouth rinse  15 mL Mouth Rinse q12n4p  . metoprolol tartrate  12.5 mg Per Tube BID  . potassium chloride  40 mEq Per Tube BID  . pyridostigmine  30 mg Per Tube BID  . sodium chloride flush  10-40 mL Intracatheter Q12H     LOS: 9 days   Cherene Altes, MD [facility] Office  [phone] Pager - Text Page per Shea Evans  If 7PM-7AM, please contact night-coverage per Amion 11/21/2018, 2:06 PM

## 2018-11-21 NOTE — Progress Notes (Signed)
CRITICAL VALUE ALERT  Critical Value: Hgb 6.8  Date & Time Notified: 11/21/2018 @ 0520  Provider Notified: Jeannette Corpus, NP  Orders Received/Actions taken: None

## 2018-11-22 LAB — BASIC METABOLIC PANEL
Anion gap: 8 (ref 5–15)
BUN: 21 mg/dL (ref 8–23)
CO2: 19 mmol/L — ABNORMAL LOW (ref 22–32)
CREATININE: 1.05 mg/dL — AB (ref 0.44–1.00)
Calcium: 8.2 mg/dL — ABNORMAL LOW (ref 8.9–10.3)
Chloride: 120 mmol/L — ABNORMAL HIGH (ref 98–111)
GFR calc Af Amer: >60 mL/min (ref >60)
GFR calc non Af Amer: 53 mL/min — ABNORMAL LOW (ref >60)
Glucose, Bld: 119 mg/dL — ABNORMAL HIGH (ref 70–99)
Potassium: 5.5 mmol/L — ABNORMAL HIGH (ref 3.5–5.1)
Sodium: 147 mmol/L — ABNORMAL HIGH (ref 135–145)

## 2018-11-22 LAB — CBC
HCT: 24.3 % — ABNORMAL LOW (ref 36.0–46.0)
Hemoglobin: 6.9 g/dL — CL (ref 12.0–15.0)
MCH: 29.1 pg (ref 26.0–34.0)
MCHC: 28.4 g/dL — ABNORMAL LOW (ref 30.0–36.0)
MCV: 102.5 fL — AB (ref 80.0–100.0)
PLATELETS: 169 10*3/uL (ref 150–400)
RBC: 2.37 MIL/uL — ABNORMAL LOW (ref 3.87–5.11)
RDW: 20.9 % — AB (ref 11.5–15.5)
WBC: 15.7 10*3/uL — ABNORMAL HIGH (ref 4.0–10.5)
nRBC: 0.6 % — ABNORMAL HIGH (ref 0.0–0.2)

## 2018-11-22 LAB — PREPARE RBC (CROSSMATCH)

## 2018-11-22 MED ORDER — FUROSEMIDE 10 MG/ML IJ SOLN
40.0000 mg | Freq: Once | INTRAMUSCULAR | Status: AC
  Administered 2018-11-22: 40 mg via INTRAVENOUS
  Filled 2018-11-22: qty 4

## 2018-11-22 MED ORDER — SODIUM CHLORIDE 0.9% IV SOLUTION: Freq: Once | INTRAVENOUS | Status: DC

## 2018-11-22 MED ORDER — ALBUTEROL SULFATE (2.5 MG/3ML) 0.083% IN NEBU
2.5000 mg | INHALATION_SOLUTION | Freq: Four times a day (QID) | RESPIRATORY_TRACT | Status: DC
  Administered 2018-11-22: 2.5 mg via RESPIRATORY_TRACT
  Filled 2018-11-22: qty 3

## 2018-11-22 MED ORDER — IPRATROPIUM-ALBUTEROL 0.5-2.5 (3) MG/3ML IN SOLN
3.0000 mL | Freq: Four times a day (QID) | RESPIRATORY_TRACT | Status: DC
  Administered 2018-11-22 – 2018-11-23 (×5): 3 mL via RESPIRATORY_TRACT
  Filled 2018-11-22 (×5): qty 3

## 2018-11-22 MED ORDER — BUDESONIDE 0.25 MG/2ML IN SUSP
0.2500 mg | Freq: Two times a day (BID) | RESPIRATORY_TRACT | Status: DC
  Administered 2018-11-22 – 2018-12-03 (×21): 0.25 mg via RESPIRATORY_TRACT
  Filled 2018-11-22 (×22): qty 2

## 2018-11-22 MED ORDER — FUROSEMIDE 10 MG/ML IJ SOLN: 20.0000 mg | Freq: Once | INTRAMUSCULAR | Status: DC

## 2018-11-22 MED ORDER — FREE WATER
200.0000 mL | Freq: Three times a day (TID) | Status: DC
  Administered 2018-11-22 – 2018-11-23 (×3): 200 mL

## 2018-11-22 NOTE — Progress Notes (Signed)
  Speech Language Pathology Treatment: Dysphagia  Patient Details Name: Abigail [name] MRN: [id number] DOB: 22-Mar-1946 Today's Date: 11/22/2018 Time: 8569-4370 SLP Time Calculation (min) (ACUTE ONLY): 10 min  Assessment / Plan / Recommendation Clinical Impression  Pt seen at bedside for assessment of po tolerance, however, pt continues to adamantly refuse po trials, despite max visual, verbal, and tactile cues. Pt would not permit oral care either, and demonstrated congested cough while SLP present. Nurse tech also reports pt has been refusing po intake, or spitting out anything that is put in her mouth. No family present to provide education. SLP will follow up once more, later this week. Will DC from [place] services if pt continues to refuse po.    HPI HPI: Pt is a 73 yo female admitted post VT/VF arrest x2, shocked 3x and intubated in the field with approximately 12 minutes of cardiac arrest prior to achieving ROSC.  Upon arrival to the ED she was posturing with a R sided gaze preference. ETT 1/24-1/27. PMH: GERD, schatzki's ring, multiple myeloma, FTT, recent hospitalization for infectious colitis and septic shock, myasthenia gravis, afib, CKD, MI, HTN, HLD, CAD      SLP Plan  Continue with current plan of care(f/u once more. Will DC if pt continues to refuse po intake)       Recommendations  Diet recommendations: Dysphagia 1 (puree);Thin liquid Liquids provided via: Cup;Straw Medication Administration: Crushed with puree Supervision: Staff to assist with self feeding;Full supervision/cueing for compensatory strategies Compensations: Minimize environmental distractions;Slow rate;Small sips/bites Postural Changes and/or Swallow Maneuvers: Seated upright 90 degrees;Upright 30-60 min after meal                Oral Care Recommendations: Oral care QID Follow up Recommendations: Skilled Nursing facility SLP Visit Diagnosis: Dysphagia, oral phase (R13.11) Plan: Continue with current plan of  care(f/u once more. Will DC if pt continues to refuse po intake)       GO              Ajna Moors B. Quentin Ore [facility], CCC-SLP Speech Language Pathologist [phone]  Shonna Chock 11/22/2018, 1:55 PM

## 2018-11-22 NOTE — Consult Note (Signed)
Ref: Nolene Ebbs, MD   Subjective:  Awake. Some wheezing. Increasing respiratory distress. Right tibial needle puncture site is heeling with decreasing hematoma size.  Objective:  Vital Signs in the last 24 hours: Temp:  [97.4 F (36.3 C)-97.9 F (36.6 C)] 97.7 F (36.5 C) (02/03 2038) Pulse Rate:  [49-62] 62 (02/03 2038) Cardiac Rhythm: Normal sinus rhythm (02/03 1938) Resp:  [17-21] 19 (02/03 2038) BP: (116-128)/(66-87) 116/72 (02/03 2038) SpO2:  [96 %-100 %] 96 % (02/03 2113) Weight:  [75.4 kg] 75.4 kg (02/03 0341)  Physical Exam: BP Readings from Last 1 Encounters:  11/22/18 116/72     Wt Readings from Last 1 Encounters:  11/22/18 75.4 kg    Weight change: 1.146 kg Body mass index is 27.66 kg/m. HEENT: [place]/AT, Eyes-Brown, PERL, EOMI, Conjunctiva-Pale, Sclera-Non-icteric Neck: No JVD, No bruit, Trachea midline. Lungs:  Wheezing, Bilateral. Cardiac:  Regular rhythm, normal S1 and S2, no S3. II/VI systolic murmur. Abdomen:  Soft, non-tender. BS present. Extremities:  1 + right leg edema present. No cyanosis. No clubbing. CNS: AxOx2, Cranial nerves grossly intact, moves all 4 extremities.  Skin: Warm and dry.   Intake/Output from previous day: 02/02 0701 - 02/03 0700 In: 2389.6 [P.O.:665; I.V.:1064.6; NG/GT:660] Out: 1250 [Urine:1050; Stool:200]    Lab Results: BMET    Component Value Date/Time   NA 147 (H) 11/22/2018 0327   NA 149 (H) 11/21/2018 0430   NA 149 (H) 11/20/2018 0530   NA 144 09/17/2017 1429   NA 141 07/30/2017 1504   NA 142 06/11/2017 1451   K 5.5 (H) 11/22/2018 0327   K 4.9 11/21/2018 0430   K 3.3 (L) 11/20/2018 0530   K 3.1 (L) 09/17/2017 1429   K 4.8 07/30/2017 1504   K 4.3 06/11/2017 1451   CL 120 (H) 11/22/2018 0327   CL 121 (H) 11/21/2018 0430   CL 125 (H) 11/20/2018 0530   CO2 19 (L) 11/22/2018 0327   CO2 19 (L) 11/21/2018 0430   CO2 17 (L) 11/20/2018 0530   CO2 23 09/17/2017 1429   CO2 20 (L) 07/30/2017 1504   CO2 20 (L)  06/11/2017 1451   GLUCOSE 119 (H) 11/22/2018 0327   GLUCOSE 116 (H) 11/21/2018 0430   GLUCOSE 120 (H) 11/20/2018 0530   GLUCOSE 96 09/17/2017 1429   GLUCOSE 79 07/30/2017 1504   GLUCOSE 74 06/11/2017 1451   BUN 21 11/22/2018 0327   BUN 20 11/21/2018 0430   BUN 20 11/20/2018 0530   BUN 15.6 09/17/2017 1429   BUN 24.4 07/30/2017 1504   BUN 18.7 06/11/2017 1451   CREATININE 1.05 (H) 11/22/2018 0327   CREATININE 1.17 (H) 11/21/2018 0430   CREATININE 1.28 (H) 11/20/2018 0530   CREATININE 1.97 (H) 10/21/2018 1055   CREATININE 1.36 (H) 10/07/2018 0823   CREATININE 1.41 (H) 09/30/2018 1005   CREATININE 1.0 09/17/2017 1429   CREATININE 1.2 (H) 07/30/2017 1504   CREATININE 1.2 (H) 06/11/2017 1451   CALCIUM 8.2 (L) 11/22/2018 0327   CALCIUM 8.1 (L) 11/21/2018 0430   CALCIUM 7.4 (L) 11/20/2018 0530   CALCIUM 8.6 09/17/2017 1429   CALCIUM 9.1 07/30/2017 1504   CALCIUM 9.6 06/11/2017 1451   GFRNONAA 53 (L) 11/22/2018 0327   GFRNONAA 47 (L) 11/21/2018 0430   GFRNONAA 42 (L) 11/20/2018 0530   GFRNONAA 25 (L) 10/21/2018 1055   GFRNONAA 39 (L) 10/07/2018 0823   GFRNONAA 37 (L) 09/30/2018 1005   GFRAA >60 11/22/2018 0327   GFRAA 54 (L) 11/21/2018 0430  GFRAA 49 (L) 11/20/2018 0530   GFRAA 29 (L) 10/21/2018 1055   GFRAA 45 (L) 10/07/2018 0823   GFRAA 43 (L) 09/30/2018 1005   CBC    Component Value Date/Time   WBC 15.7 (H) 11/22/2018 0327   RBC 2.37 (L) 11/22/2018 0327   HGB 6.9 (LL) 11/22/2018 0327   HGB 9.1 (L) 10/21/2018 1055   HGB 11.3 (L) 09/17/2017 1428   HCT 24.3 (L) 11/22/2018 0327   HCT 35.8 09/17/2017 1428   PLT 169 11/22/2018 0327   PLT 271 10/21/2018 1055   PLT 231 09/17/2017 1428   MCV 102.5 (H) 11/22/2018 0327   MCV 93.2 09/17/2017 1428   MCH 29.1 11/22/2018 0327   MCHC 28.4 (L) 11/22/2018 0327   RDW 20.9 (H) 11/22/2018 0327   RDW 16.5 (H) 09/17/2017 1428   LYMPHSABS 0.6 (L) 11/14/2018 0400   LYMPHSABS 0.7 (L) 09/17/2017 1428   MONOABS 1.8 (H) 11/14/2018 0400    MONOABS 0.5 09/17/2017 1428   EOSABS 0.0 11/14/2018 0400   EOSABS 0.1 09/17/2017 1428   BASOSABS 0.1 11/14/2018 0400   BASOSABS 0.0 09/17/2017 1428   HEPATIC Function Panel Recent Labs    11/14/18 0400 11/15/18 0931 11/21/18 0430  PROT 5.2* 5.3* 5.0*   HEMOGLOBIN A1C No components found for: HGA1C,  MPG CARDIAC ENZYMES Lab Results  Component Value Date   CKTOTAL 52 08/21/2017   CKMB 1.3 08/21/2017   TROPONINI 0.94 (HH) 11/13/2018   TROPONINI 0.92 (HH) 11/13/2018   TROPONINI 0.80 (HH) 11/12/2018   BNP No results for input(s): PROBNP in the last 8760 hours. TSH Recent Labs    12/13/17 2243 10/02/18 1825  TSH 1.922 0.751   CHOLESTEROL Recent Labs    12/14/17 0457 09/02/18 0152 10/03/18 0637  CHOL 167 143 136    Scheduled Meds: . sodium chloride   Intravenous Once  . amiodarone  200 mg Per Tube Daily  . atorvastatin  40 mg Per Tube q1800  . budesonide (PULMICORT) nebulizer solution  0.25 mg Nebulization BID  . chlorhexidine  15 mL Mouth Rinse BID  . Chlorhexidine Gluconate Cloth  6 each Topical Daily  . feeding supplement (PRO-STAT SUGAR FREE 64)  30 mL Per Tube Daily  . free water  200 mL Per Tube Q8H  . ipratropium-albuterol  3 mL Nebulization Q6H  . mouth rinse  15 mL Mouth Rinse q12n4p  . metoprolol tartrate  12.5 mg Per Tube BID  . pyridostigmine  30 mg Per Tube BID  . sodium chloride flush  10-40 mL Intracatheter Q12H   Continuous Infusions: . feeding supplement (OSMOLITE 1.2 CAL) 1,000 mL (11/21/18 2310)   PRN Meds:.oxyCODONE, sodium chloride flush, traMADol  Assessment/Plan: V. Fib cardiac arrest Acute respiratory failure with hypoxemia Acute systolic left heart failure Possible pneumonia Possible PE Acute right leg DVT Anemia of chronic disease and chronic blood loss Acute renal failure CAD Stents in LAD and LCx Hyperkalemia Depression  Hold potassium IV lasix one dose.   LOS: 10 days    [name]  MD  11/22/2018, 9:43  PM

## 2018-11-22 NOTE — Progress Notes (Signed)
[facility] TEAM 1 - Stepdown/ICU TEAM  TKEYA STENCIL  XFG:[id number] DOB: 08-30-1946 DOA: 11/12/2018 PCP: Nolene Ebbs, MD    Brief Narrative:  73 year old womanwith a hx of myasthenia gravis, atrial fibrillation, prolonged QTC, CKD, multiple myeloma, and generalized FTT after a recent hospital admission for infectious colitis and septic shockwho was admitted1/23 after a VT/VF arrest with overall time to ROSC estimated at over 12 minutes. Hypothermia protocol was initiated. The arrest happened while she was on the phone and suddenly stopped talking, began to look offbut appeared to be breathing. EMS was called and on arrival she was found to be in a shockable rhythm. She was defibrillated three times and CPR was initiated. ROSC was initially accomplished in about 2 minutes. She was intubated by EMS. She suffered another cardiac arrest prior to arrival and again had ROSC in about 10 minutes. On arrival to the ED she was posturing with a right sided gaze preference.  1/24 admit by PCCM   Subjective: The patient is resting comfortably.  Her mental status is essentially stable compared to yesterday.  There is a very mild amount of wheezing appreciated on exam.  She does not appear to be uncomfortable.  Assessment & Plan:  V. Tach / V. 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% and renal failure - monitoring off diuretic at this time - no gross volume overload on exam - stable on room air at this time  [facility] Weights   11/20/18 0420 11/21/18 0358 11/22/18 0341  Weight: 73.5 kg 74.3 kg 75.4 kg    Encephalopathy - multifactorial  Anoxia v/s metabolic - CT head January 24 showed Lytic  lesions throughout the calvarium consistent with diffuse Myeloma -mental status overall improved but without significant change over last 48 hours  Multiple myeloma with diffuse lytic bone lesions Most recent zometa infusion 10/08/2017 - chemotherapy currently on hold  Myasthenia gravis Cont pyridostigmine via NG tube  Right lower leg hematoma and tenderness at tibial bone puncture site Empiric abx added by Cardiology - remains afebrile, but WBC again climbing -continue to monitor lower extremity wound  Acute on chronic renal failure post cardiac arrest Cardiorenal syndrome versus ATN post cardiac arrest - baseline creatinine ~1.4 -renal function now stable at her baseline  Recent Labs  Lab 11/18/18 0211 11/19/18 0557 11/20/18 0530 11/21/18 0430 11/22/18 0327  CREATININE 1.86* 1.44* 1.28* 1.17* 1.05*    Acute RLE DVT / possible PE heparin stopped in view of anemia/heme positive stool  Macrocytic anemia - Acute on chronic multifactorial anemia likely multifactorial -no gross evidence of blood loss currently but has had positive stool testing this admission -all anticoagulation on hold -since the establishment of firm goals of care appears to have been delayed significantly we will proceed with transfusion of 1 unit packed red blood cells today  CAD s/p PTCA to LAD/LCx - did not undergo cardiac cath this admission  Goals of Care Palliative Care working w/ family to make plan for future care - comfort focused/hospice care is being considered   DVT prophylaxis: SCDs Code Status: DNR - NO CODE Family Communication: no family present at time  of exam  Disposition Plan: SDU  Consultants:  Palliative Care PCCM Cardiology   Antimicrobials:  Cefazolin 1/31 > 2/1  Objective: Blood pressure 125/87, pulse 61, temperature (!) 97.4 F (36.3 C), temperature source Axillary, resp. rate (!) 21, height _0  (1.651 m), weight 75.4 kg, SpO2 98 %.  Intake/Output Summary (Last 24  hours) at 11/22/2018 1505 Last data filed at 11/22/2018 0900 Gross per 24 hour  Intake 1085 ml  Output 1550 ml  Net -465 ml   Filed Weights   11/20/18 0420 11/21/18 0358 11/22/18 0341  Weight: 73.5 kg 74.3 kg 75.4 kg    Examination: General: No acute respiratory distress Lungs: Mild expiratory wheezing with no crackles Cardiovascular: RRR -no murmur Abdomen: soft, bowel sounds positive, no rebound  Extremities: 1+ edema R LE - anterior tibial wound dressed/dry without significant change  CBC: Recent Labs  Lab 11/20/18 1035 11/21/18 0430 11/22/18 0327  WBC 13.2* 14.1* 15.7*  HGB 7.2* 6.8* 6.9*  HCT 23.8* 23.3* 24.3*  MCV 101.3* 100.9* 102.5*  PLT 144* 159 355   Basic Metabolic Panel: Recent Labs  Lab 11/16/18 1700 11/17/18 0529  11/18/18 0921  11/20/18 0530 11/21/18 0430 11/22/18 0327  NA  --  145   < >  --    < > 149* 149* 147*  K  --  3.7   < >  --    < > 3.3* 4.9 5.5*  CL  --  119*   < >  --    < > 125* 121* 120*  CO2  --  15*   < >  --    < > 17* 19* 19*  GLUCOSE  --  96   < >  --    < > 120* 116* 119*  BUN  --  18   < >  --    < > _1 CREATININE  --  1.64*   < >  --    < > 1.28* 1.17* 1.05*  CALCIUM  --  9.2   < >  --    < > 7.4* 8.1* 8.2*  MG 1.8 2.4  --  2.0  --   --   --   --   PHOS 4.7* 5.2*  --  4.0  --   --   --   --    < > = values in this interval not displayed.   GFR: Estimated Creatinine Clearance: 50 mL/min (A) (by C-G formula based on SCr of 1.05 mg/dL (H)).  Liver Function Tests: Recent Labs  Lab 11/21/18 0430  AST 17  ALT 11  ALKPHOS 78  BILITOT 0.3  PROT 5.0*  ALBUMIN 2.2*    HbA1C: Hgb A1c MFr Bld  Date/Time Value Ref Range Status  12/31/2011 06:41 AM 5.7 (H) <5.7 % Final    Comment:    (NOTE)                                                                       According to the ADA Clinical Practice Recommendations for 2011, when HbA1c is used as a screening test:  >=6.5%   Diagnostic of Diabetes Mellitus            (  if abnormal result is confirmed) 5.7-6.4%   Increased risk of developing Diabetes Mellitus References:Diagnosis and Classification of Diabetes Mellitus,Diabetes CHTV,8102,54(CYOYO 1):S62-S69 and Standards of Medical Care in         Diabetes - 2011,Diabetes OJZB,3010,40 (Suppl 1):S11-S61.    CBG: Recent Labs  Lab 11/18/18 2012 11/19/18 0018 11/19/18 0359 11/19/18 0750 11/19/18 1103  GLUCAP 122* 96 112* 89 95    Recent Results (from the past 240 hour(s))  MRSA PCR Screening     Status: None   Collection Time: 11/12/18  8:51 PM  Result Value Ref Range Status   MRSA by PCR NEGATIVE NEGATIVE Final    Comment:        The GeneXpert MRSA Assay (FDA approved for NASAL specimens only), is one component of a comprehensive MRSA colonization surveillance program. It is not intended to diagnose MRSA infection nor to guide or monitor treatment for MRSA infections. Performed at [facility], [place] [address], [place], [place] [address]      Scheduled Meds: . albuterol  2.5 mg Nebulization Q6H  . amiodarone  200 mg Per Tube Daily  . atorvastatin  40 mg Per Tube q1800  . chlorhexidine  15 mL Mouth Rinse BID  . Chlorhexidine Gluconate Cloth  6 each Topical Daily  . feeding supplement (PRO-STAT SUGAR FREE 64)  30 mL Per Tube Daily  . free water  200 mL Per Tube Q6H  . mouth rinse  15 mL Mouth Rinse q12n4p  . metoprolol tartrate  12.5 mg Per Tube BID  . pyridostigmine  30 mg Per Tube BID  . sodium chloride flush  10-40 mL Intracatheter Q12H     LOS: 10 days   Cherene Altes, MD [facility] Office  [phone] Pager - Text Page per Shea Evans  If 7PM-7AM, please contact night-coverage per Amion 11/22/2018, 3:05 PM

## 2018-11-23 LAB — CBC
HCT: 26.4 % — ABNORMAL LOW (ref 36.0–46.0)
Hemoglobin: 8.2 g/dL — ABNORMAL LOW (ref 12.0–15.0)
MCH: 29.3 pg (ref 26.0–34.0)
MCHC: 31.1 g/dL (ref 30.0–36.0)
MCV: 94.3 fL (ref 80.0–100.0)
Platelets: 148 10*3/uL — ABNORMAL LOW (ref 150–400)
RBC: 2.8 MIL/uL — ABNORMAL LOW (ref 3.87–5.11)
RDW: 21.1 % — ABNORMAL HIGH (ref 11.5–15.5)
WBC: 11.5 10*3/uL — AB (ref 4.0–10.5)
nRBC: 0.8 % — ABNORMAL HIGH (ref 0.0–0.2)

## 2018-11-23 LAB — BASIC METABOLIC PANEL
ANION GAP: 9 (ref 5–15)
BUN: 27 mg/dL — ABNORMAL HIGH (ref 8–23)
CO2: 22 mmol/L (ref 22–32)
Calcium: 8.1 mg/dL — ABNORMAL LOW (ref 8.9–10.3)
Chloride: 113 mmol/L — ABNORMAL HIGH (ref 98–111)
Creatinine, Ser: 1.22 mg/dL — ABNORMAL HIGH (ref 0.44–1.00)
GFR calc non Af Amer: 45 mL/min — ABNORMAL LOW (ref >60)
GFR, EST AFRICAN AMERICAN: 52 mL/min — AB (ref >60)
Glucose, Bld: 85 mg/dL (ref 70–99)
Potassium: 4.3 mmol/L (ref 3.5–5.1)
Sodium: 144 mmol/L (ref 135–145)

## 2018-11-23 LAB — HEPARIN LEVEL (UNFRACTIONATED): Heparin Unfractionated: 0.1 IU/mL — ABNORMAL LOW (ref 0.30–0.70)

## 2018-11-23 MED ORDER — IPRATROPIUM-ALBUTEROL 0.5-2.5 (3) MG/3ML IN SOLN
3.0000 mL | Freq: Two times a day (BID) | RESPIRATORY_TRACT | Status: DC
  Administered 2018-11-24 – 2018-12-03 (×18): 3 mL via RESPIRATORY_TRACT
  Filled 2018-11-23 (×19): qty 3

## 2018-11-23 MED ORDER — FUROSEMIDE 40 MG PO TABS
40.0000 mg | ORAL_TABLET | Freq: Every day | ORAL | Status: DC
  Administered 2018-11-23: 40 mg
  Filled 2018-11-23: qty 1

## 2018-11-23 MED ORDER — HEPARIN (PORCINE) 25000 UT/250ML-% IV SOLN
700.0000 [IU]/h | INTRAVENOUS | Status: DC
  Administered 2018-11-23: 500 [IU]/h via INTRAVENOUS
  Administered 2018-11-25 – 2018-11-27 (×3): 850 [IU]/h via INTRAVENOUS
  Administered 2018-11-28: 750 [IU]/h via INTRAVENOUS
  Administered 2018-11-30: 700 [IU]/h via INTRAVENOUS
  Filled 2018-11-23 (×7): qty 250

## 2018-11-23 MED ORDER — FUROSEMIDE 10 MG/ML PO SOLN
40.0000 mg | Freq: Every day | ORAL | Status: DC
  Administered 2018-11-24 – 2018-11-26 (×3): 40 mg
  Filled 2018-11-23: qty 4
  Filled 2018-11-23 (×2): qty 5
  Filled 2018-11-23: qty 4

## 2018-11-23 MED ORDER — FUROSEMIDE 10 MG/ML IJ SOLN
40.0000 mg | Freq: Once | INTRAMUSCULAR | Status: AC
  Administered 2018-11-23: 40 mg via INTRAVENOUS
  Filled 2018-11-23: qty 4

## 2018-11-23 NOTE — Progress Notes (Signed)
Physical Therapy Treatment Patient Details Name: Abigail [name] MRN: [id number] DOB: 1946-01-29 Today's Date: 11/23/2018    History of Present Illness Pt is a 73 y.o. F with significant PMH of CKD stage III, myasthenia gravis, multiple myeloma, failure to thrive with recent hospital admission for infectious colitis and septic shock who was admitted after VT/VF arrest with overall time to ROSC estimated at 12 minutes. Intubated 1/24-1/27. Also found to have RLE DVT.     PT Comments    Patient progressing slowly towards PT goals. Pt with no verbalizations this session; nodding yes/no occasionally to questions with delayed response, if any. Tolerated sitting EOB ~12 mins supervision working on posture and extension through back. Tolerated standing with assist of 2; unable to side step along side bed despite assist for weight shifting. Pt not following commands consistently and demonstrates decreased initiation vs lack of effort?? Requested tech find recliner for transfer to chair next session. Recommend sitting EOB for all meals. Will follow.   Follow Up Recommendations  SNF     Equipment Recommendations  None recommended by PT    Recommendations for Other Services       Precautions / Restrictions Precautions Precautions: Fall Precaution Comments: cortrak, pain with leg movements Restrictions Weight Bearing Restrictions: No    Mobility  Bed Mobility Overal bed mobility: Needs Assistance Bed Mobility: Sit to Sidelying     Supine to sit: Max assist;HOB elevated   Sit to sidelying: +2 for physical assistance;Max assist General bed mobility comments: Very little initiation of movement today, reached for therapist's hand to elevate trunk, assist with LEs and to scoot bottom. Assist to lower to sidelying.  Transfers Overall transfer level: Needs assistance Equipment used: Rolling walker (2 wheeled) Transfers: Sit to/from Stand Sit to Stand: Max assist;+2 physical assistance          General transfer comment: Able to power to standing with assist of 2 with flexed trunk. Able to get fully upright. No initiation/effort? to stand but once up, min guard for balance with heavy reliance through BUEs.    Ambulation/Gait             General Gait Details: Attempted to side step however pt unable to unweight LEs despite assist for weight shifting, seems to have BLEs locked out into extension.   Stairs             Wheelchair Mobility    Modified Rankin (Stroke Patients Only)       Balance Overall balance assessment: Needs assistance Sitting-balance support: Feet supported;No upper extremity supported Sitting balance-Leahy Scale: Fair Sitting balance - Comments: Supervision sitting EOB, able to sit EOB >12 minutes. Worked on thoracic and lumbar extension and opening up chest due to poor posture.    Standing balance support: During functional activity;Bilateral upper extremity supported Standing balance-Leahy Scale: Poor Standing balance comment: Requires BUE support on RW.                            Cognition Arousal/Alertness: Awake/alert Behavior During Therapy: Flat affect Overall Cognitive Status: Impaired/Different from baseline                                 General Comments: Pt not verbalizing at all today. Difficult to get accurate assessment of cognition- following less than 10% of commands. Nods yes/no to some questions, otherwise does not answer. Poor initiation vs  lack of effort.       Exercises      General Comments General comments (skin integrity, edema, etc.): VSS throughout      Pertinent Vitals/Pain Pain Assessment: Faces Faces Pain Scale: Hurts even more Pain Location: RLE with movement Pain Descriptors / Indicators: Grimacing;Moaning Pain Intervention(s): Monitored during session;Repositioned    Home Living                      Prior Function            PT Goals (current goals can now  be found in the care plan section) Progress towards PT goals: Progressing toward goals    Frequency    Min 2X/week      PT Plan Current plan remains appropriate    Co-evaluation              AM-PAC PT "6 Clicks" Mobility   Outcome Measure  Help needed turning from your back to your side while in a flat bed without using bedrails?: A Lot Help needed moving from lying on your back to sitting on the side of a flat bed without using bedrails?: Total Help needed moving to and from a bed to a chair (including a wheelchair)?: Total Help needed standing up from a chair using your arms (e.g., wheelchair or bedside chair)?: Total Help needed to walk in hospital room?: Total Help needed climbing 3-5 steps with a railing? : Total 6 Click Score: 7    End of Session Equipment Utilized During Treatment: Gait belt Activity Tolerance: Patient limited by fatigue;Other (comment)(cognition) Patient left: in bed;with call bell/phone within reach;with bed alarm set Nurse Communication: Mobility status PT Visit Diagnosis: Muscle weakness (generalized) (M62.81);Unsteadiness on feet (R26.81);Difficulty in walking, not elsewhere classified (R26.2);Pain Pain - Right/Left: Right Pain - part of body: Leg     Time: 9407-6808 PT Time Calculation (min) (ACUTE ONLY): 30 min  Charges:  $Therapeutic Activity: 23-37 mins                     Wray Kearns, PT, DPT Acute Rehabilitation Services Pager [phone] Office [place] 11/23/2018, 3:28 PM

## 2018-11-23 NOTE — Social Work (Addendum)
4:36 pm CSW received call from RN that patient's sister, [name], was at bedside. Met with sister at bedside to discuss disposition planning. [name] is going to call her sister tonight and figure out what time they can meet with palliative tomorrow. [name] gave a cell number [phone] as an alternate to her home phone - CSW added to contacts on facesheet. Since she works during the day, may be easier to reach her on cell.   Patient will need PEG tube placement to be considered for SNF placement. Given patient's limited participation with therapy, concerned patient's insurance may not approve for rehab, but can consider if family would like for patient to try rehab at [facility]. Will follow for continued palliative recommendations/GOC and will support with disposition planning.   3:25 pm CSW called PMT to determine which palliative team member is following patient for continued goals of care/dispo planning. PMT indicated they will have someone follow up with patient/family hopefully tomorrow.  CSW placed another call to patient's sister, [name]. Awaiting call back. CSW to follow and support.  Estanislado Emms, LCSW [phone]

## 2018-11-23 NOTE — Progress Notes (Signed)
ANTICOAGULATION CONSULT NOTE - Follow Up Consult  Pharmacy Consult:  Heparin Indication:  Acute DVT  Allergies  Allergen Reactions  . Sulfa Antibiotics Rash    Patient Measurements: Height: 5\' 5"  (165.1 cm) Weight: 156 lb 12 oz (71.1 kg) IBW/kg (Calculated) : 57 Heparin Dosing Weight: 72 kg  Vital Signs: Temp: 99.2 F (37.3 C) (02/04 1700) Temp Source: Axillary (02/04 1700) BP: 100/61 (02/04 1700) Pulse Rate: 82 (02/04 1700)  Labs: Recent Labs    11/21/18 0430 11/22/18 0327 11/23/18 0559 11/23/18 1830  HGB 6.8* 6.9* 8.2*  --   HCT 23.3* 24.3* 26.4*  --   PLT 159 169 148*  --   HEPARINUNFRC  --   --   --  0.10*  CREATININE 1.17* 1.05* 1.22*  --     Estimated Creatinine Clearance: 41.8 mL/min (A) (by C-G formula based on SCr of 1.22 mg/dL (H)).    Assessment: 71 YOF with history of Afib not on anticoagulation PTA due to history of GIB.  Patient admitted s/p cardiac arrest, then developed an acute DVT and was started on IV heparin.  She subsequently had a RLE hematoma and heparin was held.  Hematoma improving and Pharmacy consulted to restart IV heparin without boluses.    Heparin level this evening is SUBtherapeutic however heparin was started late and the level was drawn early - so reflective of a 6 hour level. No issues with the drip or worsening of hematoma noted per discussion with RN. The patient was recently therapeutic at a rate of 550 units/hr - will increase conservatively to this dose.   Goal of Therapy:  Heparin level 0.3-0.5 units/ml (lower range d/t hematoma) Monitor platelets by anticoagulation protocol: Yes   Plan:  - Increase Heparin drip to 550 units/hr (5.5 ml/hr) - Will continue to monitor for any signs/symptoms of bleeding and will follow up with heparin level in 8 hours   Thank you for allowing pharmacy to be a part of this patient's care.  Alycia Rossetti, PharmD, BCPS Clinical Pharmacist Clinical phone for 11/23/2018: [phone] 11/23/2018 8:49  PM   **Pharmacist phone directory can now be found on [email or web address] ([email or web address]).  Listed under [place].

## 2018-11-23 NOTE — Care Management Important Message (Signed)
Important Message  Patient Details  Name: Abigail [name] MRN: [id number] Date of Birth: 04/30/1946   Medicare Important Message Given:  Yes    [name] 11/23/2018, 5:03 PM

## 2018-11-23 NOTE — Progress Notes (Signed)
Palliative Medicine RN Note: Rec'd a call from pt's RN Meredith Mody that [name] (sister [phone]) will be here to meet with PMT between 4:00 and 4:15 tomorrow. I passed this message on to the provider who will be seeing her, but I'm unable to confirm an appointment, as they manage their own schedules.  Gwen does report that she told [name] that she didn't know if we could be available then.   Marjie Skiff Abigail Westfall, RN, BSN, [facility] Palliative Medicine Team 11/23/2018 5:46 PM Office [phone]

## 2018-11-23 NOTE — Consult Note (Addendum)
Ref: Nolene Ebbs, MD   Subjective:  Awake. Replies answers to questions as "yes" or "no" or nodding head. Respiratory distress continues but decreased wheezing.  Monitor shows sinus rhythm.  Hyperkalemia resolved post good diuresis. Hgb apparently up to 8.2 from 6.9 g from 1 unit PRBC  transfusion.  Objective:  Vital Signs in the last 24 hours: Temp:  [97.4 F (36.3 C)-98.9 F (37.2 C)] 98.9 F (37.2 C) (02/04 0800) Pulse Rate:  [60-70] 70 (02/04 0800) Cardiac Rhythm: Atrial fibrillation (02/04 0800) Resp:  [17-24] 22 (02/04 0800) BP: (102-125)/(55-87) 102/72 (02/04 0800) SpO2:  [92 %-100 %] 99 % (02/04 0800) Weight:  [71.1 kg] 71.1 kg (02/04 0500)  Physical Exam: BP Readings from Last 1 Encounters:  11/23/18 102/72     Wt Readings from Last 1 Encounters:  11/23/18 71.1 kg    Weight change: -4.3 kg Body mass index is 26.08 kg/m. HEENT: [place]/AT, Eyes-Brown, Conjunctiva-Pale, Sclera-Non-icteric Neck: No JVD, No bruit, Trachea midline. Lungs:  Scattered rales, Bilateral. Cardiac:  Regular rhythm, normal S1 and S2, no S3. II/VI systolic murmur. Abdomen:  Soft, non-tender. BS present. Extremities:  Right below the knee area hematoma and tenderness persist. No cyanosis. No clubbing. CNS: AxOx2, Cranial nerves grossly intact, moves all 4 extremities.  Skin: Warm and dry.   Intake/Output from previous day: 02/03 0701 - 02/04 0700 In: 465 [P.O.:45; NG/GT:420] Out: 3350 [Urine:2750; Stool:600]    Lab Results: BMET    Component Value Date/Time   NA 144 11/23/2018 0559   NA 147 (H) 11/22/2018 0327   NA 149 (H) 11/21/2018 0430   NA 144 09/17/2017 1429   NA 141 07/30/2017 1504   NA 142 06/11/2017 1451   K 4.3 11/23/2018 0559   K 5.5 (H) 11/22/2018 0327   K 4.9 11/21/2018 0430   K 3.1 (L) 09/17/2017 1429   K 4.8 07/30/2017 1504   K 4.3 06/11/2017 1451   CL 113 (H) 11/23/2018 0559   CL 120 (H) 11/22/2018 0327   CL 121 (H) 11/21/2018 0430   CO2 22 11/23/2018 0559    CO2 19 (L) 11/22/2018 0327   CO2 19 (L) 11/21/2018 0430   CO2 23 09/17/2017 1429   CO2 20 (L) 07/30/2017 1504   CO2 20 (L) 06/11/2017 1451   GLUCOSE 85 11/23/2018 0559   GLUCOSE 119 (H) 11/22/2018 0327   GLUCOSE 116 (H) 11/21/2018 0430   GLUCOSE 96 09/17/2017 1429   GLUCOSE 79 07/30/2017 1504   GLUCOSE 74 06/11/2017 1451   BUN 27 (H) 11/23/2018 0559   BUN 21 11/22/2018 0327   BUN 20 11/21/2018 0430   BUN 15.6 09/17/2017 1429   BUN 24.4 07/30/2017 1504   BUN 18.7 06/11/2017 1451   CREATININE 1.22 (H) 11/23/2018 0559   CREATININE 1.05 (H) 11/22/2018 0327   CREATININE 1.17 (H) 11/21/2018 0430   CREATININE 1.97 (H) 10/21/2018 1055   CREATININE 1.36 (H) 10/07/2018 0823   CREATININE 1.41 (H) 09/30/2018 1005   CREATININE 1.0 09/17/2017 1429   CREATININE 1.2 (H) 07/30/2017 1504   CREATININE 1.2 (H) 06/11/2017 1451   CALCIUM 8.1 (L) 11/23/2018 0559   CALCIUM 8.2 (L) 11/22/2018 0327   CALCIUM 8.1 (L) 11/21/2018 0430   CALCIUM 8.6 09/17/2017 1429   CALCIUM 9.1 07/30/2017 1504   CALCIUM 9.6 06/11/2017 1451   GFRNONAA 45 (L) 11/23/2018 0559   GFRNONAA 53 (L) 11/22/2018 0327   GFRNONAA 47 (L) 11/21/2018 0430   GFRNONAA 25 (L) 10/21/2018 1055   GFRNONAA 39 (L)  10/07/2018 0823   GFRNONAA 37 (L) 09/30/2018 1005   GFRAA 52 (L) 11/23/2018 0559   GFRAA >60 11/22/2018 0327   GFRAA 54 (L) 11/21/2018 0430   GFRAA 29 (L) 10/21/2018 1055   GFRAA 45 (L) 10/07/2018 0823   GFRAA 43 (L) 09/30/2018 1005   CBC    Component Value Date/Time   WBC 11.5 (H) 11/23/2018 0559   RBC 2.80 (L) 11/23/2018 0559   HGB 8.2 (L) 11/23/2018 0559   HGB 9.1 (L) 10/21/2018 1055   HGB 11.3 (L) 09/17/2017 1428   HCT 26.4 (L) 11/23/2018 0559   HCT 35.8 09/17/2017 1428   PLT 148 (L) 11/23/2018 0559   PLT 271 10/21/2018 1055   PLT 231 09/17/2017 1428   MCV 94.3 11/23/2018 0559   MCV 93.2 09/17/2017 1428   MCH 29.3 11/23/2018 0559   MCHC 31.1 11/23/2018 0559   RDW 21.1 (H) 11/23/2018 0559   RDW 16.5 (H)  09/17/2017 1428   LYMPHSABS 0.6 (L) 11/14/2018 0400   LYMPHSABS 0.7 (L) 09/17/2017 1428   MONOABS 1.8 (H) 11/14/2018 0400   MONOABS 0.5 09/17/2017 1428   EOSABS 0.0 11/14/2018 0400   EOSABS 0.1 09/17/2017 1428   BASOSABS 0.1 11/14/2018 0400   BASOSABS 0.0 09/17/2017 1428   HEPATIC Function Panel Recent Labs    11/14/18 0400 11/15/18 0931 11/21/18 0430  PROT 5.2* 5.3* 5.0*   HEMOGLOBIN A1C No components found for: HGA1C,  MPG CARDIAC ENZYMES Lab Results  Component Value Date   CKTOTAL 52 08/21/2017   CKMB 1.3 08/21/2017   TROPONINI 0.94 (HH) 11/13/2018   TROPONINI 0.92 (HH) 11/13/2018   TROPONINI 0.80 (HH) 11/12/2018   BNP No results for input(s): PROBNP in the last 8760 hours. TSH Recent Labs    12/13/17 2243 10/02/18 1825  TSH 1.922 0.751   CHOLESTEROL Recent Labs    12/14/17 0457 09/02/18 0152 10/03/18 0637  CHOL 167 143 136    Scheduled Meds: . sodium chloride   Intravenous Once  . amiodarone  200 mg Per Tube Daily  . atorvastatin  40 mg Per Tube q1800  . budesonide (PULMICORT) nebulizer solution  0.25 mg Nebulization BID  . chlorhexidine  15 mL Mouth Rinse BID  . Chlorhexidine Gluconate Cloth  6 each Topical Daily  . feeding supplement (PRO-STAT SUGAR FREE 64)  30 mL Per Tube Daily  . free water  200 mL Per Tube Q8H  . ipratropium-albuterol  3 mL Nebulization Q6H  . mouth rinse  15 mL Mouth Rinse q12n4p  . metoprolol tartrate  12.5 mg Per Tube BID  . pyridostigmine  30 mg Per Tube BID  . sodium chloride flush  10-40 mL Intracatheter Q12H   Continuous Infusions: . feeding supplement (OSMOLITE 1.2 CAL) 1,000 mL (11/23/18 0402)   PRN Meds:.oxyCODONE, sodium chloride flush, traMADol  Assessment/Plan: V. Fib cardiac arrest Acute respiratory failure with hypoxemia Acute systolic heart failure Possible pneumonia Possible PE Acute right leg DVT Anemia of chronic disease and chronic blood loss Acute renal failure CAD Stents on LAD and  LCx Hyperkalemia, resolved Depression Hypoxic encephalopathy  Oral lasix. EKG for prolonged QTc and amiodarone use. Resume IV heparin without bolus Monitor H and H.   LOS: 11 days    [name]  MD  11/23/2018, 9:29 AM

## 2018-11-23 NOTE — Progress Notes (Signed)
[facility] TEAM 1 - Stepdown/ICU TEAM  Abigail [name]  IDP:[id number] DOB: 1946-05-21 DOA: 11/12/2018 PCP: Nolene Ebbs, MD    Brief Narrative:  73 year old womanwith a hx of myasthenia gravis, atrial fibrillation, prolonged QTC, CKD, multiple myeloma, and generalized FTT after a recent hospital admission for infectious colitis and septic shockwho was admitted1/23 after a VT/VF arrest with overall time to ROSC estimated at over 12 minutes. Hypothermia protocol was initiated. The arrest happened while she was on the phone and suddenly stopped talking, began to look offbut appeared to be breathing. EMS was called and on arrival she was found to be in a shockable rhythm. She was defibrillated three times and CPR was initiated. ROSC was initially accomplished in about 2 minutes. She was intubated by EMS. She suffered another cardiac arrest prior to arrival and again had ROSC in about 10 minutes. On arrival to the ED she was posturing with a right sided gaze preference.  1/24 admit by PCCM   Subjective: The patient is laying flat in bed.  She is less responsive to me today than compared to yesterday.  She does not appear to be in acute respiratory distress.  There is some mild appreciable wheezing on exam.  Her nurse shows me a significant volume of thick greenish sputum that the patient coughed up when she was being sat up side of the bed.  Assessment & Plan:  V. Tach / V. 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% and renal failure -appears mildly overloaded at this time -Lasix resumed today  Filed Weights   11/21/18 0358 11/22/18 0341 11/23/18 0500  Weight:  74.3 kg 75.4 kg 71.1 kg    Encephalopathy - multifactorial  Anoxia v/s metabolic - CT head January 24 showed Lytic lesions throughout the calvarium consistent with diffuse Myeloma -mental status appears stable at this time with the patient minimally interactive  Multiple myeloma with diffuse lytic bone lesions Most recent zometa infusion 10/08/2017 - chemotherapy currently on hold  Myasthenia gravis Cont pyridostigmine via NG tube  Right lower leg hematoma and tenderness at tibial bone puncture site Short course of empiric abx has been administered- remains afebrile - WBC now trending downward - continue to monitor lower extremity wound  Acute on chronic renal failure post cardiac arrest Cardiorenal syndrome versus ATN post cardiac arrest - baseline creatinine ~1.4 -creatinine slightly increased today -diuresis resumed -follow trend  Recent Labs  Lab 11/19/18 0557 11/20/18 0530 11/21/18 0430 11/22/18 0327 11/23/18 0559  CREATININE 1.44* 1.28* 1.17* 1.05* 1.22*    Acute RLE DVT / possible PE heparin stopped earlier in view of anemia/heme positive stool -resuming anticoagulation today -follow hemoglobin in serial fashion  Macrocytic anemia - Acute on chronic multifactorial anemia likely multifactorial -no gross evidence of blood loss currently but has had positive stool testing this admission -resume IV heparin today for known DVT and follow hemoglobin in serial fashion  CAD s/p PTCA to LAD/LCx - did not undergo cardiac cath this admission -Cardiology following  Goals of Care Palliative Care working w/ family to make plan for future care - comfort focused/hospice care is being  considered -if hospice care is not chosen the patient will need a PEG tube and then placement within a skilled nursing facility   DVT prophylaxis: SCDs Code Status: DNR - NO CODE Family Communication: no family present at time of exam  Disposition Plan: transfer to tele bed   Consultants:    Palliative Care PCCM Cardiology   Antimicrobials:  Cefazolin 1/31 > 2/1  Objective: Blood pressure 102/72, pulse 70, temperature 98.9 F (37.2 C), temperature source Axillary, resp. rate (!) 22, height '5\' 5"'  (1.651 m), weight 71.1 kg, SpO2 97 %.  Intake/Output Summary (Last 24 hours) at 11/23/2018 1507 Last data filed at 11/23/2018 1240 Gross per 24 hour  Intake 640 ml  Output 2750 ml  Net -2110 ml   Filed Weights   11/21/18 0358 11/22/18 0341 11/23/18 0500  Weight: 74.3 kg 75.4 kg 71.1 kg    Examination: General: Noncommunicative at time of my exam today Lungs: Mild expiratory wheezing persists -no focal crackles Cardiovascular: RRR  Abdomen: soft, bowel sounds positive, no rebound  Extremities: Trace bilateral lower extremity edema  CBC: Recent Labs  Lab 11/21/18 0430 11/22/18 0327 11/23/18 0559  WBC 14.1* 15.7* 11.5*  HGB 6.8* 6.9* 8.2*  HCT 23.3* 24.3* 26.4*  MCV 100.9* 102.5* 94.3  PLT 159 169 009*   Basic Metabolic Panel: Recent Labs  Lab 11/16/18 1700 11/17/18 0529  11/18/18 0921  11/21/18 0430 11/22/18 0327 11/23/18 0559  NA  --  145   < >  --    < > 149* 147* 144  K  --  3.7   < >  --    < > 4.9 5.5* 4.3  CL  --  119*   < >  --    < > 121* 120* 113*  CO2  --  15*   < >  --    < > 19* 19* 22  GLUCOSE  --  96   < >  --    < > 116* 119* 85  BUN  --  18   < >  --    < > 20 21 27*  CREATININE  --  1.64*   < >  --    < > 1.17* 1.05* 1.22*  CALCIUM  --  9.2   < >  --    < > 8.1* 8.2* 8.1*  MG 1.8 2.4  --  2.0  --   --   --   --   PHOS 4.7* 5.2*  --  4.0  --   --   --   --    < > = values in this interval not displayed.   GFR: Estimated Creatinine Clearance: 41.8 mL/min (A) (by C-G formula based on SCr of 1.22 mg/dL (H)).  Liver Function Tests: Recent Labs  Lab 11/21/18 0430  AST 17  ALT 11  ALKPHOS 78  BILITOT 0.3  PROT 5.0*  ALBUMIN 2.2*    HbA1C: Hgb A1c MFr Bld  Date/Time Value Ref Range Status  12/31/2011 06:41 AM 5.7 (H) <5.7 %  Final    Comment:    (NOTE)  According to the ADA Clinical Practice Recommendations for 2011, when HbA1c is used as a screening test:  >=6.5%   Diagnostic of Diabetes Mellitus           (if abnormal result is confirmed) 5.7-6.4%   Increased risk of developing Diabetes Mellitus References:Diagnosis and Classification of Diabetes Mellitus,Diabetes [id number],4801,65(VVZSM 1):S62-S69 and Standards of Medical Care in         Diabetes - 2011,Diabetes OLMB,8675,44 (Suppl 1):S11-S61.    CBG: Recent Labs  Lab 11/18/18 2012 11/19/18 0018 11/19/18 0359 11/19/18 0750 11/19/18 1103  GLUCAP 122* 96 112* 89 95     Scheduled Meds: . sodium chloride   Intravenous Once  . amiodarone  200 mg Per Tube Daily  . atorvastatin  40 mg Per Tube q1800  . budesonide (PULMICORT) nebulizer solution  0.25 mg Nebulization BID  . chlorhexidine  15 mL Mouth Rinse BID  . Chlorhexidine Gluconate Cloth  6 each Topical Daily  . feeding supplement (PRO-STAT SUGAR FREE 64)  30 mL Per Tube Daily  . free water  200 mL Per Tube Q8H  . ipratropium-albuterol  3 mL Nebulization Q6H  . mouth rinse  15 mL Mouth Rinse q12n4p  . metoprolol tartrate  12.5 mg Per Tube BID  . pyridostigmine  30 mg Per Tube BID  . sodium chloride flush  10-40 mL Intracatheter Q12H     LOS: 11 days   Cherene Altes, MD [facility] Office  [phone] Pager - Text Page per Shea Evans  If 7PM-7AM, please contact night-coverage per Amion 11/23/2018, 3:07 PM

## 2018-11-23 NOTE — Progress Notes (Signed)
ANTICOAGULATION CONSULT NOTE - Follow Up Consult  Pharmacy Consult:  Heparin Indication:  Acute DVT  Allergies  Allergen Reactions  . Sulfa Antibiotics Rash    Patient Measurements: Height: 5\' 5"  (165.1 cm) Weight: 156 lb 12 oz (71.1 kg) IBW/kg (Calculated) : 57 Heparin Dosing Weight: 72 kg  Vital Signs: Temp: 98.9 F (37.2 C) (02/04 0800) Temp Source: Axillary (02/04 0800) BP: 102/72 (02/04 0800) Pulse Rate: 70 (02/04 0800)  Labs: Recent Labs    11/21/18 0430 11/22/18 0327 11/23/18 0559  HGB 6.8* 6.9* 8.2*  HCT 23.3* 24.3* 26.4*  PLT 159 169 148*  CREATININE 1.17* 1.05* 1.22*    Estimated Creatinine Clearance: 41.8 mL/min (A) (by C-G formula based on SCr of 1.22 mg/dL (H)).    Assessment: 89 YOF with history of Afib not on anticoagulation PTA due to history of GIB.  Patient admitted s/p cardiac arrest, then developed an acute DVT and was started on IV heparin.  She subsequently had a RLE hematoma and heparin was held.  Hematoma improving and Pharmacy consulted to restart IV heparin without boluses.    Goal of Therapy:  Heparin level 0.3-0.7 units/ml, aim for low therapeutic range Monitor platelets by anticoagulation protocol: Yes   Plan:  Restart IV heparin at 500 units/hr, no bolus with hematoma/anemia Check 8 hr heparin level Daily heparin level and CBC  Riyaan Heroux D. Mina Marble, PharmD, BCPS, [place] 11/23/2018, 10:07 AM

## 2018-11-24 ENCOUNTER — Inpatient Hospital Stay (HOSPITAL_COMMUNITY): Payer: Medicare HMO

## 2018-11-24 DIAGNOSIS — B370 Candidal stomatitis: Secondary | ICD-10-CM

## 2018-11-24 DIAGNOSIS — J189 Pneumonia, unspecified organism: Secondary | ICD-10-CM

## 2018-11-24 DIAGNOSIS — S2249XA Multiple fractures of ribs, unspecified side, initial encounter for closed fracture: Secondary | ICD-10-CM

## 2018-11-24 LAB — CBC
HEMATOCRIT: 24.7 % — AB (ref 36.0–46.0)
HEMOGLOBIN: 8 g/dL — AB (ref 12.0–15.0)
MCH: 30 pg (ref 26.0–34.0)
MCHC: 32.4 g/dL (ref 30.0–36.0)
MCV: 92.5 fL (ref 80.0–100.0)
PLATELETS: 159 10*3/uL (ref 150–400)
RBC: 2.67 MIL/uL — ABNORMAL LOW (ref 3.87–5.11)
RDW: 20.3 % — ABNORMAL HIGH (ref 11.5–15.5)
WBC: 11.1 10*3/uL — ABNORMAL HIGH (ref 4.0–10.5)
nRBC: 0.4 % — ABNORMAL HIGH (ref 0.0–0.2)

## 2018-11-24 LAB — RENAL FUNCTION PANEL
ALBUMIN: 2 g/dL — AB (ref 3.5–5.0)
Anion gap: 10 (ref 5–15)
BUN: 26 mg/dL — ABNORMAL HIGH (ref 8–23)
CO2: 25 mmol/L (ref 22–32)
CREATININE: 1.34 mg/dL — AB (ref 0.44–1.00)
Calcium: 7.9 mg/dL — ABNORMAL LOW (ref 8.9–10.3)
Chloride: 107 mmol/L (ref 98–111)
GFR calc Af Amer: 46 mL/min — ABNORMAL LOW (ref >60)
GFR calc non Af Amer: 40 mL/min — ABNORMAL LOW (ref >60)
Glucose, Bld: 95 mg/dL (ref 70–99)
Phosphorus: 3.6 mg/dL (ref 2.5–4.6)
Potassium: 3.7 mmol/L (ref 3.5–5.1)
Sodium: 142 mmol/L (ref 135–145)

## 2018-11-24 LAB — HEPARIN LEVEL (UNFRACTIONATED)
Heparin Unfractionated: 0.29 IU/mL — ABNORMAL LOW (ref 0.30–0.70)
Heparin Unfractionated: 0.2 IU/mL — ABNORMAL LOW (ref 0.30–0.70)

## 2018-11-24 MED ORDER — MORPHINE SULFATE (PF) 2 MG/ML IV SOLN
2.0000 mg | INTRAVENOUS | Status: DC | PRN
  Administered 2018-11-24 – 2018-11-25 (×6): 2 mg via INTRAVENOUS
  Filled 2018-11-24 (×6): qty 1

## 2018-11-24 MED ORDER — FLUCONAZOLE 100 MG PO TABS
100.0000 mg | ORAL_TABLET | Freq: Every day | ORAL | Status: AC
  Administered 2018-11-24 – 2018-12-01 (×8): 100 mg via ORAL
  Filled 2018-11-24 (×8): qty 1

## 2018-11-24 MED ORDER — HYDROCOD POLST-CPM POLST ER 10-8 MG/5ML PO SUER
5.0000 mL | Freq: Two times a day (BID) | ORAL | Status: DC
  Administered 2018-11-24 – 2018-11-26 (×4): 5 mL via ORAL
  Filled 2018-11-24 (×4): qty 5

## 2018-11-24 NOTE — Progress Notes (Signed)
Pharmacy Antibiotic Note  Abigail [name] is a 73 y.o. female admitted on 11/12/2018 with oropharyngeal candidiasis.  Pharmacy has been consulted for fluconazole dosing.  WBC slightly elevated at 11.1, afebrile. Scr 1.34 (CrCl ~38 mL/min). A white coating was noted on her tongue, which was contributing to lack of oral intake.    Plan: Fluconazole 100 mg daily for 7 days  Monitor renal fx, clinical pic  Height: 5\' 5"  (165.1 cm) Weight: 156 lb 12 oz (71.1 kg) IBW/kg (Calculated) : 57  Temp (24hrs), Avg:98.4 F (36.9 C), Min:97.6 F (36.4 C), Max:99.2 F (37.3 C)  Recent Labs  Lab 11/20/18 0530 11/20/18 1035 11/21/18 0430 11/22/18 0327 11/23/18 0559 11/24/18 0500  WBC 11.5* 13.2* 14.1* 15.7* 11.5* 11.1*  CREATININE 1.28*  --  1.17* 1.05* 1.22* 1.34*    Estimated Creatinine Clearance: 38.1 mL/min (A) (by C-G formula based on SCr of 1.34 mg/dL (H)).    Allergies  Allergen Reactions  . Sulfa Antibiotics Rash    Thank you for allowing pharmacy to be a part of this patient's care.  Antonietta Jewel, PharmD, [place] Clinical Pharmacist  Pager: [phone] Phone: [phone] 11/24/2018 4:30 PM

## 2018-11-24 NOTE — Progress Notes (Addendum)
Nutrition Follow-up  DOCUMENTATION CODES:   Non-severe (moderate) malnutrition in context of chronic illness  INTERVENTION:   Continue TF via Cortrak tube:  Osmolite 1.2 at 60 ml/h   Pro-stat 30 ml once daily  Provides 1828 kcal, 95 gm protein, 1181 ml free water daily  NUTRITION DIAGNOSIS:   Moderate Malnutrition related to chronic illness as evidenced by moderate muscle depletion, moderate fat depletion.  Ongoing   GOAL:   Patient will meet greater than or equal to 90% of their needs  Met with TF  MONITOR:   PO intake, Supplement acceptance, Diet advancement, TF tolerance  ASSESSMENT:   73 y/o female PMHx Multiple Myeloma, currently on chemotherapy, ckd3, FTT, Afib, CAD. Had recently been admitted 1/3-1/14 for infectious colitis. Now presents via EMS after suffering 2x cardiac arrest, first w/ ROSC 2 min and second ROSC 10 min. Intubated in field and begun on hypothermia protocol on arrival  Diet was advanced to dysphagia 1 with thin liquid 1/30. Patient is consuming 0-5% of meals.   Patient is currently receiving Osmolite 1.2 via Cortrak tube at 60 ml/h (1440 ml per day) to provide 1728 kcals, 80 gm protein, 1181 ml free water daily. Tolerating TF well.   Labs and medications reviewed.  Palliative Care team is following, plans for meeting with family today.    Diet Order:   Diet Order            DIET - DYS 1 Room service appropriate? Yes; Fluid consistency: Thin  Diet effective now              EDUCATION NEEDS:   No education needs have been identified at this time  Skin:  Skin Assessment: Reviewed RN Assessment  Last BM:  2/5 type 7 (rectal tube)  Height:   Ht Readings from Last 1 Encounters:  11/12/18 5' 5" (1.651 m)    Weight:   Wt Readings from Last 1 Encounters:  11/23/18 71.1 kg    Ideal Body Weight:  56.82 kg  BMI:  Body mass index is 26.08 kg/m.  Estimated Nutritional Needs:   Kcal:  1700-1900 kcal  Protein:  85-100  grams  Fluid:  >/= 1.7 L/day    Molli Barrows, RD, LDN, [place] Pager [phone] After Hours Pager [phone]

## 2018-11-24 NOTE — Consult Note (Signed)
Ref: Nolene Ebbs, MD   Subjective:  Awake. Respiratory distress continues without wheezing post nebulizer treatment. T max 99.2 degree F. Sinus rhythm with sinus arrhythmia on monitor and Hb is stable with IV heparin. Very limited communication by patient.  Objective:  Vital Signs in the last 24 hours: Temp:  [98.1 F (36.7 C)-99.2 F (37.3 C)] 98.7 F (37.1 C) (02/05 0656) Pulse Rate:  [65-82] 65 (02/05 0656) Cardiac Rhythm: Normal sinus rhythm (02/05 0700) Resp:  [16-24] 24 (02/05 0656) BP: (100-101)/(61-68) 101/68 (02/04 2104) SpO2:  [91 %-100 %] 100 % (02/05 0825)  Physical Exam: BP Readings from Last 1 Encounters:  11/23/18 101/68     Wt Readings from Last 1 Encounters:  11/23/18 71.1 kg    Weight change:  Body mass index is 26.08 kg/m. HEENT: [place]/AT, Eyes-Brown, PERL, EOMI, Conjunctiva-Pale, Sclera-Non-icteric Neck: No JVD, No bruit, Trachea midline. Lungs:  Right lung rhonchi, left lung clearer.. Cardiac:  Regular rhythm, normal S1 and S2, no S3. II/VI systolic murmur. Abdomen:  Soft, non-tender. BS present. Extremities:  No edema present. No cyanosis. No clubbing. CNS: AxOx2, Cranial nerves grossly intact, moves all 4 extremities.  Skin: Warm and dry.   Intake/Output from previous day: 02/04 0701 - 02/05 0700 In: 300 [P.O.:180; NG/GT:120] Out: 2500 [Urine:2500]    Lab Results: BMET    Component Value Date/Time   NA 142 11/24/2018 0500   NA 144 11/23/2018 0559   NA 147 (H) 11/22/2018 0327   NA 144 09/17/2017 1429   NA 141 07/30/2017 1504   NA 142 06/11/2017 1451   K 3.7 11/24/2018 0500   K 4.3 11/23/2018 0559   K 5.5 (H) 11/22/2018 0327   K 3.1 (L) 09/17/2017 1429   K 4.8 07/30/2017 1504   K 4.3 06/11/2017 1451   CL 107 11/24/2018 0500   CL 113 (H) 11/23/2018 0559   CL 120 (H) 11/22/2018 0327   CO2 25 11/24/2018 0500   CO2 22 11/23/2018 0559   CO2 19 (L) 11/22/2018 0327   CO2 23 09/17/2017 1429   CO2 20 (L) 07/30/2017 1504   CO2 20 (L)  06/11/2017 1451   GLUCOSE 95 11/24/2018 0500   GLUCOSE 85 11/23/2018 0559   GLUCOSE 119 (H) 11/22/2018 0327   GLUCOSE 96 09/17/2017 1429   GLUCOSE 79 07/30/2017 1504   GLUCOSE 74 06/11/2017 1451   BUN 26 (H) 11/24/2018 0500   BUN 27 (H) 11/23/2018 0559   BUN 21 11/22/2018 0327   BUN 15.6 09/17/2017 1429   BUN 24.4 07/30/2017 1504   BUN 18.7 06/11/2017 1451   CREATININE 1.34 (H) 11/24/2018 0500   CREATININE 1.22 (H) 11/23/2018 0559   CREATININE 1.05 (H) 11/22/2018 0327   CREATININE 1.97 (H) 10/21/2018 1055   CREATININE 1.36 (H) 10/07/2018 0823   CREATININE 1.41 (H) 09/30/2018 1005   CREATININE 1.0 09/17/2017 1429   CREATININE 1.2 (H) 07/30/2017 1504   CREATININE 1.2 (H) 06/11/2017 1451   CALCIUM 7.9 (L) 11/24/2018 0500   CALCIUM 8.1 (L) 11/23/2018 0559   CALCIUM 8.2 (L) 11/22/2018 0327   CALCIUM 8.6 09/17/2017 1429   CALCIUM 9.1 07/30/2017 1504   CALCIUM 9.6 06/11/2017 1451   GFRNONAA 40 (L) 11/24/2018 0500   GFRNONAA 45 (L) 11/23/2018 0559   GFRNONAA 53 (L) 11/22/2018 0327   GFRNONAA 25 (L) 10/21/2018 1055   GFRNONAA 39 (L) 10/07/2018 0823   GFRNONAA 37 (L) 09/30/2018 1005   GFRAA 46 (L) 11/24/2018 0500   GFRAA 52 (L) 11/23/2018 0559  GFRAA >60 11/22/2018 0327   GFRAA 29 (L) 10/21/2018 1055   GFRAA 45 (L) 10/07/2018 0823   GFRAA 43 (L) 09/30/2018 1005   CBC    Component Value Date/Time   WBC 11.1 (H) 11/24/2018 0500   RBC 2.67 (L) 11/24/2018 0500   HGB 8.0 (L) 11/24/2018 0500   HGB 9.1 (L) 10/21/2018 1055   HGB 11.3 (L) 09/17/2017 1428   HCT 24.7 (L) 11/24/2018 0500   HCT 35.8 09/17/2017 1428   PLT 159 11/24/2018 0500   PLT 271 10/21/2018 1055   PLT 231 09/17/2017 1428   MCV 92.5 11/24/2018 0500   MCV 93.2 09/17/2017 1428   MCH 30.0 11/24/2018 0500   MCHC 32.4 11/24/2018 0500   RDW 20.3 (H) 11/24/2018 0500   RDW 16.5 (H) 09/17/2017 1428   LYMPHSABS 0.6 (L) 11/14/2018 0400   LYMPHSABS 0.7 (L) 09/17/2017 1428   MONOABS 1.8 (H) 11/14/2018 0400   MONOABS  0.5 09/17/2017 1428   EOSABS 0.0 11/14/2018 0400   EOSABS 0.1 09/17/2017 1428   BASOSABS 0.1 11/14/2018 0400   BASOSABS 0.0 09/17/2017 1428   HEPATIC Function Panel Recent Labs    11/14/18 0400 11/15/18 0931 11/21/18 0430  PROT 5.2* 5.3* 5.0*   HEMOGLOBIN A1C No components found for: HGA1C,  MPG CARDIAC ENZYMES Lab Results  Component Value Date   CKTOTAL 52 08/21/2017   CKMB 1.3 08/21/2017   TROPONINI 0.94 (HH) 11/13/2018   TROPONINI 0.92 (HH) 11/13/2018   TROPONINI 0.80 (HH) 11/12/2018   BNP No results for input(s): PROBNP in the last 8760 hours. TSH Recent Labs    12/13/17 2243 10/02/18 1825  TSH 1.922 0.751   CHOLESTEROL Recent Labs    12/14/17 0457 09/02/18 0152 10/03/18 0637  CHOL 167 143 136    Scheduled Meds: . amiodarone  200 mg Per Tube Daily  . atorvastatin  40 mg Per Tube q1800  . budesonide (PULMICORT) nebulizer solution  0.25 mg Nebulization BID  . chlorhexidine  15 mL Mouth Rinse BID  . Chlorhexidine Gluconate Cloth  6 each Topical Daily  . feeding supplement (PRO-STAT SUGAR FREE 64)  30 mL Per Tube Daily  . furosemide  40 mg Per Tube Daily  . ipratropium-albuterol  3 mL Nebulization BID  . mouth rinse  15 mL Mouth Rinse q12n4p  . metoprolol tartrate  12.5 mg Per Tube BID  . pyridostigmine  30 mg Per Tube BID  . sodium chloride flush  10-40 mL Intracatheter Q12H   Continuous Infusions: . feeding supplement (OSMOLITE 1.2 CAL) 1,000 mL (11/23/18 0402)  . heparin 650 Units/hr (11/24/18 0721)   PRN Meds:.oxyCODONE, sodium chloride flush, traMADol  Assessment/Plan: V. Fib cardiac arrest Acute on chronic systolic heart failure Acute respiratory failure with hypoxemia Possible pneumonia Possible PE Acute right leg DVT Anemia of chronic disease and chronic blood loss Acute renal failure CAD S/P stents in LAD and LCx Hyperkalemia resolved Depression Hypoxic encephalopathy  EKG pending. Chest x-ray r/o right lung pneumonia. May need  G tube and NH placement.   LOS: 12 days    [name]  MD  11/24/2018, 8:34 AM

## 2018-11-24 NOTE — Progress Notes (Signed)
ANTICOAGULATION CONSULT NOTE - Follow Up Consult  Pharmacy Consult:  Heparin Indication:  Acute DVT  Allergies  Allergen Reactions  . Sulfa Antibiotics Rash    Patient Measurements: Height: 5\' 5"  (165.1 cm) Weight: 156 lb 12 oz (71.1 kg) IBW/kg (Calculated) : 57 Heparin Dosing Weight: 72 kg  Vital Signs: BP: 91/51 (02/04 2104) Pulse Rate: 79 (02/04 2104)  Labs: Recent Labs    11/22/18 0327 11/23/18 0559 11/23/18 1830 11/24/18 0500  HGB 6.9* 8.2*  --  8.0*  HCT 24.3* 26.4*  --  24.7*  PLT 169 148*  --  159  HEPARINUNFRC  --   --  0.10* 0.29*  CREATININE 1.05* 1.22*  --  1.34*    Estimated Creatinine Clearance: 38.1 mL/min (A) (by C-G formula based on SCr of 1.34 mg/dL (H)).    Assessment: 56 YOF with history of Afib not on anticoagulation PTA due to history of GIB.  Patient admitted s/p cardiac arrest, then developed an acute DVT and was started on IV heparin.  She subsequently had a RLE hematoma and heparin was held.  Hematoma improving and Pharmacy consulted to restart IV heparin without boluses.    Heparin level this morning remains slightly subtherapeutic on gtt at 550 units/hr. No issues with the drip or worsening of hematoma noted per discussion with RN. H/H low but stable.  Goal of Therapy:  Heparin level 0.3-0.5 units/ml (lower range d/t hematoma) Monitor platelets by anticoagulation protocol: Yes   Plan:  - Increase heparin drip to 650 units/hr  - F/u heparin level in 8 hours and monitor for s/s bleeding  Thank you for allowing pharmacy to be a part of this patient's care.  Sherlon Handing, PharmD, BCPS Clinical pharmacist  **Pharmacist phone directory can now be found on [email or web address] ([email or web address]).  Listed under [place]. 11/24/2018 5:55 AM

## 2018-11-24 NOTE — Progress Notes (Signed)
Occupational Therapy Treatment Patient Details Name: Abigail [name] MRN: [id number] DOB: 1946/05/12 Today's Date: 11/24/2018    History of present illness Pt is a 73 y.o. F with significant PMH of CKD stage III, myasthenia gravis, multiple myeloma, failure to thrive with recent hospital admission for infectious colitis and septic shock who was admitted after VT/VF arrest with overall time to ROSC estimated at 12 minutes. Intubated 1/24-1/27. Also found to have RLE DVT.    OT comments  Pt max a with bed mobility to sit EOB and sat EOB x 8 minutes for simple grooming tasks with hand over hand mod A to initiate. Pt very distracted by pain in LEs with movement. Pt followed 2/5 simple commands consistently. OT will continue to follow acutely  Follow Up Recommendations  SNF;Supervision/Assistance - 24 hour    Equipment Recommendations  Other (comment)(TBD at next venue of care)    Recommendations for Other Services      Precautions / Restrictions Precautions Precautions: Fall Precaution Comments: cortrak, pain with leg movements Restrictions Weight Bearing Restrictions: No       Mobility Bed Mobility Overal bed mobility: Needs Assistance Bed Mobility: Supine to Sit;Sit to Supine     Supine to sit: Max assist;HOB elevated Sit to supine: Total assist   General bed mobility comments: minimal initiation of movement. Was able to assist using rails to scoot to [place] transfer comment: NT    Balance Overall balance assessment: Needs assistance Sitting-balance support: Feet supported;No upper extremity supported Sitting balance-Leahy Scale: Fair Sitting balance - Comments: min guard A sitting EOB x 8 minutes                                   ADL either performed or assessed with clinical judgement   ADL                                         General ADL Comments: Pt sat EOB x 8 minutes for simple grooming tasks  with hand over hand mod A to initiate. Pt very distracted by pain in LEs with movement     Vision       Perception     Praxis      Cognition Arousal/Alertness: Awake/alert Behavior During Therapy: Flat affect Overall Cognitive Status: Impaired/Different from baseline                                 General Comments: pt minimally verbal today. Pt followed one step simple commands inconsistently        Exercises     Shoulder Instructions       General Comments      Pertinent Vitals/ Pain       Pain Assessment: Faces Faces Pain Scale: Hurts even more Pain Location: RLE with movement Pain Descriptors / Indicators: Grimacing;Moaning Pain Intervention(s): Limited activity within patient's tolerance;Monitored during session;Repositioned  Home Living                                          Prior Functioning/Environment  Frequency           Progress Toward Goals  OT Goals(current goals can now be found in the care plan section)  Progress towards OT goals: OT to reassess next treatment     Plan Discharge plan remains appropriate    Co-evaluation                 AM-PAC OT "6 Clicks" Daily Activity     Outcome Measure   Help from another person eating meals?: Total Help from another person taking care of personal grooming?: A Lot Help from another person toileting, which includes using toliet, bedpan, or urinal?: Total Help from another person bathing (including washing, rinsing, drying)?: Total Help from another person to put on and taking off regular upper body clothing?: Total Help from another person to put on and taking off regular lower body clothing?: Total 6 Click Score: 7    End of Session    OT Visit Diagnosis: Pain;Muscle weakness (generalized) (M62.81);Other symptoms and signs involving cognitive function Pain - Right/Left: (bilaterally) Pain - part of body: Leg   Activity Tolerance  Patient limited by fatigue;Patient limited by pain   Patient Left in bed;with call bell/phone within reach;with bed alarm set   Nurse Communication          Time: 5110-2111 OT Time Calculation (min): 14 min  Charges: OT General Charges $OT Visit: 1 Visit OT Treatments $Therapeutic Activity: 8-22 mins     [name] 11/24/2018, 11:26 AM

## 2018-11-24 NOTE — Progress Notes (Addendum)
Daily Progress Note   Patient Name: Abigail [name]       Date: 11/24/2018 DOB: December 23, 1945  Age: 73 y.o. MRN#: [id number] Attending Physician: Mariel Aloe, MD Primary Care Physician: Nolene Ebbs, MD Admit Date: 11/12/2018  Reason for Consultation/Follow-up: Establishing goals of care  Subjective: Patient seen, she was writhing in bed. She answered her name and location appropriately, but otherwise would not engage in [place] discussion this morning. Ordered IV morphine- per nursing this has been helpful in relieving pain. Patient is pointing to upper abdomen when asked where her pain is.   I was able to meet with patient and her sisters [name] and Tonia Ghent at the bedside later in the day. Patient had just received morphine and appeared comfortable. Review of chest xray shows rib fractures (possibly lytic, probably worsened by CPR) and possible infiltrates. She has a new cough, that is painful.  With pain relieved and sisters at beside patient was much more animated and responsive. I let her know we were there to discuss her state of health and plan and goals and asked if she wanted to participate in discussion and she verbalized "Yes". I asked how she was prior to this hospitalization and she said, "Very sick". I asked if she knew why she in the hospital now and she said, "No". I asked if she wanted me to tell her, she responded, "Yes". I explained her course, cardiac arrest, intubation, ventilator, and now with feeding tube, now with possible pneumonia, DVT in leg, rib fractures. She reacted with surprise upon hearing of her cardiac arrest. We discussed that she isn't eating. I asked if her mouth hurt and she said, "Yes". Upon inspection she has a white coating on her tongue.  I presented option of continued  aggressive medical care with continued tube feeding, antibiotics, physical therapy, nursing facility vs transition to comfort care no tube feeding, no antibiotics, comfort feeding, Hospice House, with support through end of life and expected outcome of comfortable death. I answered her sister's questions related to both paths of care.  I asked Catrice if she heard everything I had said and what her sister's had asked, and Beverely stated, "Yes". I asked Yarelie if she had a preference and she said, "Yes". I asked her what her preference was, and Sigrid stated, "Hospice".  [name]  asked me if I felt Aaleeyah was decisional based on her receiving morphine. Chart review shows she had received two doses of 66m morphine, one this morning and one just prior to our discussion. I do not think the amount of morphine she received affected her decision making ability, rather I am more apt to say that relieving her pain allowed her to focus and engage in conversation more than she was able to when I saw her previously.  At close of her discussion her sister's requested to continue full scope for now and [facility] and ask her to ensure she understands. They state they will call PMT team tomorrow after they visit with Keeana more and discuss options.    ROS  Length of Stay: 12  Current Medications: Scheduled Meds:  . amiodarone  200 mg Per Tube Daily  . atorvastatin  40 mg Per Tube q1800  . budesonide (PULMICORT) nebulizer solution  0.25 mg Nebulization BID  . chlorhexidine  15 mL Mouth Rinse BID  . Chlorhexidine Gluconate Cloth  6 each Topical Daily  . feeding supplement (PRO-STAT SUGAR FREE 64)  30 mL Per Tube Daily  . furosemide  40 mg Per Tube Daily  . ipratropium-albuterol  3 mL Nebulization BID  . mouth rinse  15 mL Mouth Rinse q12n4p  . metoprolol tartrate  12.5 mg Per Tube BID  . pyridostigmine  30 mg Per Tube BID  . sodium chloride flush  10-40 mL Intracatheter Q12H    Continuous Infusions: .  feeding supplement (OSMOLITE 1.2 CAL) 1,000 mL (11/23/18 0402)  . heparin 650 Units/hr (11/24/18 0721)    PRN Meds: morphine injection, oxyCODONE, sodium chloride flush, traMADol  Physical Exam Vitals signs and nursing note reviewed.  Constitutional:      Comments: Frail, cachetic  HENT:     Mouth/Throat:     Mouth: Mucous membranes are dry.     Comments: White coating on tongue Pulmonary:     Breath sounds: Wheezing present.     Comments: Wet, non productive cough Skin:    General: Skin is warm and dry.  Neurological:     Mental Status: She is alert.     Comments: Slow to respond, oriented x 2, responds mostly with one word answers  Psychiatric:        Thought Content: Thought content normal.        Judgment: Judgment normal.             Vital Signs: BP 101/86 (BP Location: Left Arm)   Pulse 61   Temp 97.6 F (36.4 C) (Oral)   Resp 16   Ht '5\' 5"'  (1.651 m)   Wt 71.1 kg   SpO2 100%   BMI 26.08 kg/m  SpO2: SpO2: 100 % O2 Device: O2 Device: Room Air O2 Flow Rate: O2 Flow Rate (L/min): 3 L/min  Intake/output summary:   Intake/Output Summary (Last 24 hours) at 11/24/2018 1038 Last data filed at 11/23/2018 2332 Gross per 24 hour  Intake 120 ml  Output 2300 ml  Net -2180 ml   LBM: Last BM Date: 11/23/18 Baseline Weight: Weight: 74 kg Most recent weight: Weight: 71.1 kg       Palliative Assessment/Data: PPS: 10%    Flowsheet Rows     Most Recent Value  Intake Tab  Referral Department  Hospitalist  Unit at Time of Referral  ICU  Palliative Care Primary Diagnosis  Cardiac  Date Notified  11/17/18  Palliative Care Type  New Palliative care  Reason for referral  Clarify Goals of Care  Date of Admission  11/12/18  Date first seen by Palliative Care  11/17/18  # of days Palliative referral response time  0 Day(s)  # of days IP prior to Palliative referral  5  Clinical Assessment  Psychosocial & Spiritual Assessment  Palliative Care Outcomes      Patient  Active Problem List   Diagnosis Date Noted  . Acute respiratory failure with hypoxia ([place])   . AKI (acute kidney injury) ([place])   . Advanced care planning/counseling discussion   . Goals of care, counseling/discussion   . Palliative care by specialist   . Cardiac arrest with ventricular fibrillation ([place]) 11/12/2018  . Hypotension   . Septic shock ([place]) 10/23/2018  . Acute kidney injury superimposed on chronic kidney disease ([place]) 10/22/2018  . Colitis 10/22/2018  . Bradycardia 10/22/2018  . AF (paroxysmal atrial fibrillation) ([place]) 10/22/2018  . SVT (supraventricular tachycardia) ([place]) 10/08/2018  . Atrial fibrillation with RVR ([place]) 1July 18, 202019  . Precordial chest pain 09/15/2018  . Multiple myeloma in relapse ([place]) 06/15/2018  . Encounter for central line placement 06/15/2018  . Hematochezia   . Acute blood loss anemia   . Diverticulosis of colon with hemorrhage   . GIB (gastrointestinal bleeding) 09/03/2017  . Symptomatic anemia 09/03/2017  . Acute systolic heart failure ([place]) 09/03/2017  . Chronic kidney disease (CKD), stage III (moderate) ([place]) 09/03/2017  . Hyperlipidemia 09/03/2017  . Multiple myeloma in remission ([place]) 09/03/2017  . GI bleed 08/17/2017  . Sepsis ([place]) 08/17/2017  . Fever 08/16/2017  . STEMI (ST elevation myocardial infarction) ([place]) 02/24/2017  . Acute anterolateral wall MI ([place]) 02/24/2017  . Dehydration 01/21/2017  . Acute coronary syndrome ([place]) 12/24/2016  . Dizziness 12/22/2016    Class: Acute  . Orthostatic hypotension 12/08/2016  . Syncope and collapse 12/06/2016  . Tachycardia-bradycardia ([place])   . Essential hypertension   . Nausea and vomiting in adult patient   . Erosive esophagitis   . Protein-calorie malnutrition, severe 11/19/2016  . LLQ pain   . LUQ abdominal pain   . Intractable nausea and vomiting 11/17/2016  . Abdominal pain 11/05/2016  . Multiple myeloma ([place]) 06/18/2016  . Malnutrition of moderate degree 06/14/2016  . Iron  deficiency anemia secondary to blood loss (chronic) 01/11/2016  . Exertional dyspnea 11/01/2015  . Coronary atherosclerosis of native coronary artery 11/01/2015  . Shortness of breath 11/01/2015  . Chest pain 12/31/2011  . Coronary artery disease 10/20/1997    Palliative Care Assessment & Plan   Patient Profile: 73 y.o. female  with past medical history of myasthenia gravis, a. Fib w/ RVR, prolonged QT, CKD III, multiple myeloma with diffuse lytic lesions- (chemo recently held due to poor performance status, weight loss), recent hospitalization d/t septic shock from infectious colitis (discharged home on 1/14- of note during that hospitalization patient stated she did not want aggressive or artificial life support measures per 1/3 note from Dr. Valeta Harms) admitted on 11/12/2018 with cardiac arrest x2 s/p CPR x 1 for 2 minutes and then x 10 minutes with intubation. She was noted to be posturing and had rightsided gaze preference in the ED. She is now extubated, however, mental status is slow to improve. ECHO showed 20-25% EF with diffuse hypokinesis. Palliative medicine consulted for [place].    Assessment/Recommendations/Plan   Continue morphine prn for pain  Tussionex q12hr for cough  Diflucan per pharmacy dosing for thrush  Patient's sister to call PMT tomorrow  afternoon for followup  Goals of Care and Additional Recommendations:  Limitations on Scope of Treatment: Full Scope Treatment  Code Status:  DNR  Prognosis:   Unable to determine  Discharge Planning:  To Be Determined  Care plan was discussed with patient and sisters.  Thank you for allowing the Palliative Medicine Team to assist in the care of this patient.   Time In: 1330 Time Out: 1500 Total Time 90 minutes Prolonged Time Billed yes      Greater than 50%  of this time was spent counseling and coordinating care related to the above assessment and plan.  Mariana Kaufman, AGNP-C Palliative Medicine   Please contact  Palliative Medicine Team phone at [phone] for questions and concerns.

## 2018-11-24 NOTE — Progress Notes (Signed)
PROGRESS NOTE    Abigail [name]  YCX:[id number] DOB: 03-29-1946 DOA: 11/12/2018 PCP: Nolene Ebbs, MD   Brief Narrative: Abigail [name] is a 73 y.o. womanwith a hx ofmyasthenia gravis, atrial fibrillation, prolonged QTC, CKD,multiple myeloma, and generalized FTT. Patient presented secondary to VT/VF arrest x2. Defibrillated 3 times. Intubated prior to arrival to hospital.    Assessment & Plan:   Active Problems:   Encounter for central line placement   Cardiac arrest with ventricular fibrillation ([place])   Acute respiratory failure with hypoxia ([facility])   AKI (acute kidney injury) ([place])   Advanced care planning/counseling discussion   Goals of care, counseling/discussion   Palliative care by specialist   V-tach/V-fib arrest Evaluated by EP and not a candidate for an AICD.  Tachy-brady syndrome EP consulted. Initially on amiodarone which was discontinued secondary to hypotension. Has been stable on amiodarone PO. -Cardiology recommendations  Acute respiratory failure with hypoxia Resolved.  Encephalopathy Anoxic vs metabolic. Appears to be improving so possibly metabolic.   Moderate malnutrition Currently with an NG tube in place on tube feeds -Goals of care discussions -Continue tube feeds  Multiple myeloma Diffuse lytic bone lesions. Last received Zometa in December. Chemo on hold  Myasthenia gravis -Continue pyridostigmine  Right lower leg hematoma  Acute RLE DVT Currently on heparin drip -Watch CBC  Macrocytic anemia Acute on chronic.   Acute kidney injury on CKD III Baseline creatinine of 1.4. Peak creatinine of 1.86. improved.  CAD Cardiology on board. S/p PTCA to LAD/LCx in past. No cath performed this admission.   Generalized pain -Continue Oxycodone, tramadol, morphine prn   DVT prophylaxis: SCDs Code Status:   Code Status: DNR Family Communication: None at bedside Disposition Plan: Discharge pending goals of care/cardiology  recommendations   Consultants:   Cardiology  Palliative care  Procedures:   None  Antimicrobials:  Cefazolin    Subjective: In pain generally  Objective: Vitals:   11/23/18 2104 11/24/18 0656 11/24/18 0822 11/24/18 0825  BP: 101/68   101/86  Pulse: 79 65  61  Resp: 20 (!) 24  16  Temp: 98.1 F (36.7 C) 98.7 F (37.1 C)  97.6 F (36.4 C)  TempSrc: Oral Oral  Oral  SpO2: 91%  100% 100%  Weight:      Height:        Intake/Output Summary (Last 24 hours) at 11/24/2018 0953 Last data filed at 11/23/2018 2332 Gross per 24 hour  Intake 210 ml  Output 2500 ml  Net -2290 ml   Filed Weights   11/21/18 0358 11/22/18 0341 11/23/18 0500  Weight: 74.3 kg 75.4 kg 71.1 kg    Examination:  General exam: Appears calm and comfortable Respiratory system: Coarse breath sounds. Respiratory effort normal. Cardiovascular system: S1 & S2 heard, normal rate with irregular rhythm. No murmurs, rubs, gallops or clicks. Gastrointestinal system: Abdomen is nondistended, soft and nontender. No organomegaly or masses felt. Normal bowel sounds heard. Central nervous system: Alert and oriented. Extremities: No edema. Tenderness circumferentially. No erythema. Skin: No cyanosis. No rashes Psychiatry: Judgement and insight appear normal. Flat affect.    Data Reviewed: I have personally reviewed following labs and imaging studies  CBC: Recent Labs  Lab 11/20/18 1035 11/21/18 0430 11/22/18 0327 11/23/18 0559 11/24/18 0500  WBC 13.2* 14.1* 15.7* 11.5* 11.1*  HGB 7.2* 6.8* 6.9* 8.2* 8.0*  HCT 23.8* 23.3* 24.3* 26.4* 24.7*  MCV 101.3* 100.9* 102.5* 94.3 92.5  PLT 144* 159 169 148* 497   Basic Metabolic Panel:  Recent Labs  Lab 11/18/18 0921  11/20/18 0530 11/21/18 0430 11/22/18 0327 11/23/18 0559 11/24/18 0500  NA  --    < > 149* 149* 147* 144 142  K  --    < > 3.3* 4.9 5.5* 4.3 3.7  CL  --    < > 125* 121* 120* 113* 107  CO2  --    < > 17* 19* 19* 22 25  GLUCOSE  --    < >  120* 116* 119* 85 95  BUN  --    < > _0 27* 26*  CREATININE  --    < > 1.28* 1.17* 1.05* 1.22* 1.34*  CALCIUM  --    < > 7.4* 8.1* 8.2* 8.1* 7.9*  MG 2.0  --   --   --   --   --   --   PHOS 4.0  --   --   --   --   --  3.6   < > = values in this interval not displayed.   GFR: Estimated Creatinine Clearance: 38.1 mL/min (A) (by C-G formula based on SCr of 1.34 mg/dL (H)). Liver Function Tests: Recent Labs  Lab 11/21/18 0430 11/24/18 0500  AST 17  --   ALT 11  --   ALKPHOS 78  --   BILITOT 0.3  --   PROT 5.0*  --   ALBUMIN 2.2* 2.0*   No results for input(s): LIPASE, AMYLASE in the last 168 hours. No results for input(s): AMMONIA in the last 168 hours. Coagulation Profile: No results for input(s): INR, PROTIME in the last 168 hours. Cardiac Enzymes: No results for input(s): CKTOTAL, CKMB, CKMBINDEX, TROPONINI in the last 168 hours. BNP (last 3 results) No results for input(s): PROBNP in the last 8760 hours. HbA1C: No results for input(s): HGBA1C in the last 72 hours. CBG: Recent Labs  Lab 11/18/18 2012 11/19/18 0018 11/19/18 0359 11/19/18 0750 11/19/18 1103  GLUCAP 122* 96 112* 89 95   Lipid Profile: No results for input(s): CHOL, HDL, LDLCALC, TRIG, CHOLHDL, LDLDIRECT in the last 72 hours. Thyroid Function Tests: No results for input(s): TSH, T4TOTAL, FREET4, T3FREE, THYROIDAB in the last 72 hours. Anemia Panel: No results for input(s): VITAMINB12, FOLATE, FERRITIN, TIBC, IRON, RETICCTPCT in the last 72 hours. Sepsis Labs: No results for input(s): PROCALCITON, LATICACIDVEN in the last 168 hours.  No results found for this or any previous visit (from the past 240 hour(s)).       Radiology Studies: No results found.      Scheduled Meds: . amiodarone  200 mg Per Tube Daily  . atorvastatin  40 mg Per Tube q1800  . budesonide (PULMICORT) nebulizer solution  0.25 mg Nebulization BID  . chlorhexidine  15 mL Mouth Rinse BID  . Chlorhexidine Gluconate  Cloth  6 each Topical Daily  . feeding supplement (PRO-STAT SUGAR FREE 64)  30 mL Per Tube Daily  . furosemide  40 mg Per Tube Daily  . ipratropium-albuterol  3 mL Nebulization BID  . mouth rinse  15 mL Mouth Rinse q12n4p  . metoprolol tartrate  12.5 mg Per Tube BID  . pyridostigmine  30 mg Per Tube BID  . sodium chloride flush  10-40 mL Intracatheter Q12H   Continuous Infusions: . feeding supplement (OSMOLITE 1.2 CAL) 1,000 mL (11/23/18 0402)  . heparin 650 Units/hr (11/24/18 0721)     LOS: 12 days     Cordelia Poche, MD [facility] 11/24/2018, 9:53 AM  If 7PM-7AM, please contact night-coverage [email or web address]

## 2018-11-24 NOTE — Progress Notes (Signed)
ANTICOAGULATION CONSULT NOTE - Follow Up Consult  Pharmacy Consult:  Heparin Indication:  Acute DVT  Allergies  Allergen Reactions  . Sulfa Antibiotics Rash    Patient Measurements: Height: 5\' 5"  (165.1 cm) Weight: 156 lb 12 oz (71.1 kg) IBW/kg (Calculated) : 57 Heparin Dosing Weight: 72 kg  Vital Signs: Temp: 99.2 F (37.3 C) (02/05 1643) Temp Source: Oral (02/05 1643) BP: 113/52 (02/05 1643) Pulse Rate: 61 (02/05 1643)  Labs: Recent Labs    11/22/18 0327 11/23/18 0559 11/23/18 1830 11/24/18 0500 11/24/18 1656  HGB 6.9* 8.2*  --  8.0*  --   HCT 24.3* 26.4*  --  24.7*  --   PLT 169 148*  --  159  --   HEPARINUNFRC  --   --  0.10* 0.29* 0.20*  CREATININE 1.05* 1.22*  --  1.34*  --     Estimated Creatinine Clearance: 38.1 mL/min (A) (by C-G formula based on SCr of 1.34 mg/dL (H)).    Assessment: 19 YOF with history of Afib not on anticoagulation PTA due to history of GIB.  Patient admitted s/p cardiac arrest, then developed an acute DVT and was started on IV heparin.  She subsequently had a RLE hematoma and heparin was held.  Hematoma improving and Pharmacy consulted to restart IV heparin without boluses.    HL this evening remains subtherapeutic and has trended down. Hematoma remains stable and no overt s/s of bleeding per rn  Goal of Therapy:  Heparin level 0.3-0.5 units/ml (lower range d/t hematoma) Monitor platelets by anticoagulation protocol: Yes   Plan:  - Increase heparin drip to 850 units/hr. No bolus  - F/u heparin level in 8 hours and monitor for s/s bleeding  Thank you for allowing pharmacy to be a part of this patient's care.  Albertina Parr, PharmD., BCPS Clinical Pharmacist Clinical phone for 11/24/18 until 10:30pm: [phone] If after 10:30pm, please refer to [facility] for unit-specific pharmacist

## 2018-11-25 DIAGNOSIS — G7000 Myasthenia gravis without (acute) exacerbation: Secondary | ICD-10-CM

## 2018-11-25 LAB — CBC
HEMATOCRIT: 25.9 % — AB (ref 36.0–46.0)
Hemoglobin: 8.4 g/dL — ABNORMAL LOW (ref 12.0–15.0)
MCH: 29.9 pg (ref 26.0–34.0)
MCHC: 32.4 g/dL (ref 30.0–36.0)
MCV: 92.2 fL (ref 80.0–100.0)
PLATELETS: 164 10*3/uL (ref 150–400)
RBC: 2.81 MIL/uL — ABNORMAL LOW (ref 3.87–5.11)
RDW: 19.5 % — ABNORMAL HIGH (ref 11.5–15.5)
WBC: 10.3 10*3/uL (ref 4.0–10.5)
nRBC: 0 % (ref 0.0–0.2)

## 2018-11-25 LAB — PROCALCITONIN: Procalcitonin: 0.2 ng/mL

## 2018-11-25 LAB — HEPARIN LEVEL (UNFRACTIONATED)
Heparin Unfractionated: 0.42 IU/mL (ref 0.30–0.70)
Heparin Unfractionated: 0.41 IU/mL (ref 0.30–0.70)

## 2018-11-25 MED ORDER — POTASSIUM CHLORIDE 20 MEQ/15ML (10%) PO SOLN
10.0000 meq | ORAL | Status: DC
  Administered 2018-11-25 – 2018-11-26 (×2): 10 meq via ORAL
  Filled 2018-11-25 (×2): qty 15

## 2018-11-25 MED ORDER — AMIODARONE HCL 100 MG PO TABS
100.0000 mg | ORAL_TABLET | Freq: Every day | ORAL | Status: DC
  Administered 2018-11-26 – 2018-12-03 (×8): 100 mg
  Filled 2018-11-25 (×8): qty 1

## 2018-11-25 NOTE — Consult Note (Signed)
Ref: Nolene Ebbs, MD   Subjective:  Awake. T max 99.9 degree F. Decreased wheezing, basal crackles on lung auscultation persist. Tube feeding continues. Hgb stable.EKG falsely calculates normal QTc, it is about 470 ms.  Objective:  Vital Signs in the last 24 hours: Temp:  [98.6 F (37 C)-99.9 F (37.7 C)] 98.6 F (37 C) (02/06 0816) Pulse Rate:  [61-65] 61 (02/06 0816) Cardiac Rhythm: Normal sinus rhythm (02/06 0830) Resp:  [18-26] 18 (02/06 0816) BP: (91-119)/(49-71) 112/60 (02/06 0816) SpO2:  [94 %-100 %] 94 % (02/06 0816)  Physical Exam: BP Readings from Last 1 Encounters:  11/25/18 112/60     Wt Readings from Last 1 Encounters:  11/23/18 71.1 kg    Weight change:  Body mass index is 26.08 kg/m. HEENT: [place]/AT, Eyes-Brown, PERL, EOMI, Conjunctiva-Pale, Sclera-Non-icteric Neck: No JVD, No bruit, Trachea midline. Lungs:  Basal crackles, Bilateral. Cardiac:  Regular rhythm, normal S1 and S2, no S3. II/VI systolic murmur. Abdomen:  Soft, non-tender. BS present. Extremities:  No edema present. No cyanosis. No clubbing. CNS: AxOx2, Cranial nerves grossly intact, moves all 4 extremities.  Skin: Warm and dry.   Intake/Output from previous day: 02/05 0701 - 02/06 0700 In: 730 [I.V.:100; NG/GT:630] Out: 550 [Urine:550]    Lab Results: BMET    Component Value Date/Time   NA 142 11/24/2018 0500   NA 144 11/23/2018 0559   NA 147 (H) 11/22/2018 0327   NA 144 09/17/2017 1429   NA 141 07/30/2017 1504   NA 142 06/11/2017 1451   K 3.7 11/24/2018 0500   K 4.3 11/23/2018 0559   K 5.5 (H) 11/22/2018 0327   K 3.1 (L) 09/17/2017 1429   K 4.8 07/30/2017 1504   K 4.3 06/11/2017 1451   CL 107 11/24/2018 0500   CL 113 (H) 11/23/2018 0559   CL 120 (H) 11/22/2018 0327   CO2 25 11/24/2018 0500   CO2 22 11/23/2018 0559   CO2 19 (L) 11/22/2018 0327   CO2 23 09/17/2017 1429   CO2 20 (L) 07/30/2017 1504   CO2 20 (L) 06/11/2017 1451   GLUCOSE 95 11/24/2018 0500   GLUCOSE 85  11/23/2018 0559   GLUCOSE 119 (H) 11/22/2018 0327   GLUCOSE 96 09/17/2017 1429   GLUCOSE 79 07/30/2017 1504   GLUCOSE 74 06/11/2017 1451   BUN 26 (H) 11/24/2018 0500   BUN 27 (H) 11/23/2018 0559   BUN 21 11/22/2018 0327   BUN 15.6 09/17/2017 1429   BUN 24.4 07/30/2017 1504   BUN 18.7 06/11/2017 1451   CREATININE 1.34 (H) 11/24/2018 0500   CREATININE 1.22 (H) 11/23/2018 0559   CREATININE 1.05 (H) 11/22/2018 0327   CREATININE 1.97 (H) 10/21/2018 1055   CREATININE 1.36 (H) 10/07/2018 0823   CREATININE 1.41 (H) 09/30/2018 1005   CREATININE 1.0 09/17/2017 1429   CREATININE 1.2 (H) 07/30/2017 1504   CREATININE 1.2 (H) 06/11/2017 1451   CALCIUM 7.9 (L) 11/24/2018 0500   CALCIUM 8.1 (L) 11/23/2018 0559   CALCIUM 8.2 (L) 11/22/2018 0327   CALCIUM 8.6 09/17/2017 1429   CALCIUM 9.1 07/30/2017 1504   CALCIUM 9.6 06/11/2017 1451   GFRNONAA 40 (L) 11/24/2018 0500   GFRNONAA 45 (L) 11/23/2018 0559   GFRNONAA 53 (L) 11/22/2018 0327   GFRNONAA 25 (L) 10/21/2018 1055   GFRNONAA 39 (L) 10/07/2018 0823   GFRNONAA 37 (L) 09/30/2018 1005   GFRAA 46 (L) 11/24/2018 0500   GFRAA 52 (L) 11/23/2018 0559   GFRAA >60 11/22/2018 0327  GFRAA 29 (L) 10/21/2018 1055   GFRAA 45 (L) 10/07/2018 0823   GFRAA 43 (L) 09/30/2018 1005   CBC    Component Value Date/Time   WBC 10.3 11/25/2018 0347   RBC 2.81 (L) 11/25/2018 0347   HGB 8.4 (L) 11/25/2018 0347   HGB 9.1 (L) 10/21/2018 1055   HGB 11.3 (L) 09/17/2017 1428   HCT 25.9 (L) 11/25/2018 0347   HCT 35.8 09/17/2017 1428   PLT 164 11/25/2018 0347   PLT 271 10/21/2018 1055   PLT 231 09/17/2017 1428   MCV 92.2 11/25/2018 0347   MCV 93.2 09/17/2017 1428   MCH 29.9 11/25/2018 0347   MCHC 32.4 11/25/2018 0347   RDW 19.5 (H) 11/25/2018 0347   RDW 16.5 (H) 09/17/2017 1428   LYMPHSABS 0.6 (L) 11/14/2018 0400   LYMPHSABS 0.7 (L) 09/17/2017 1428   MONOABS 1.8 (H) 11/14/2018 0400   MONOABS 0.5 09/17/2017 1428   EOSABS 0.0 11/14/2018 0400   EOSABS 0.1  09/17/2017 1428   BASOSABS 0.1 11/14/2018 0400   BASOSABS 0.0 09/17/2017 1428   HEPATIC Function Panel Recent Labs    11/14/18 0400 11/15/18 0931 11/21/18 0430  PROT 5.2* 5.3* 5.0*   HEMOGLOBIN A1C No components found for: HGA1C,  MPG CARDIAC ENZYMES Lab Results  Component Value Date   CKTOTAL 52 08/21/2017   CKMB 1.3 08/21/2017   TROPONINI 0.94 (HH) 11/13/2018   TROPONINI 0.92 (HH) 11/13/2018   TROPONINI 0.80 (HH) 11/12/2018   BNP No results for input(s): PROBNP in the last 8760 hours. TSH Recent Labs    12/13/17 2243 10/02/18 1825  TSH 1.922 0.751   CHOLESTEROL Recent Labs    12/14/17 0457 09/02/18 0152 10/03/18 0637  CHOL 167 143 136    Scheduled Meds: . amiodarone  200 mg Per Tube Daily  . atorvastatin  40 mg Per Tube q1800  . budesonide (PULMICORT) nebulizer solution  0.25 mg Nebulization BID  . chlorhexidine  15 mL Mouth Rinse BID  . Chlorhexidine Gluconate Cloth  6 each Topical Daily  . chlorpheniramine-HYDROcodone  5 mL Oral Q12H  . feeding supplement (PRO-STAT SUGAR FREE 64)  30 mL Per Tube Daily  . fluconazole  100 mg Oral Daily  . furosemide  40 mg Per Tube Daily  . ipratropium-albuterol  3 mL Nebulization BID  . mouth rinse  15 mL Mouth Rinse q12n4p  . metoprolol tartrate  12.5 mg Per Tube BID  . pyridostigmine  30 mg Per Tube BID  . sodium chloride flush  10-40 mL Intracatheter Q12H   Continuous Infusions: . feeding supplement (OSMOLITE 1.2 CAL) 60 mL/hr at 11/24/18 2100  . heparin 850 Units/hr (11/25/18 0512)   PRN Meds:.morphine injection, oxyCODONE, sodium chloride flush, traMADol  Assessment/Plan: V. 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 Low dose potassium replacement. Decrease amiodarone by 50 %. Awaiting SNF placement.   LOS: 13 days    [name]  MD  11/25/2018, 9:39 AM

## 2018-11-25 NOTE — Progress Notes (Signed)
Daily Progress Note   Patient Name: Abigail [name]       Date: 11/25/2018 DOB: April 13, 1946  Age: 73 y.o. MRN#: [id number] Attending Physician: Mariel Aloe, MD Primary Care Physician: Nolene Ebbs, MD Admit Date: 11/12/2018  Reason for Consultation/Follow-up: Establishing goals of care  Subjective: Patient awake - tells me pain is better. Last received morphine 2 hours ago. I asked her if she remembered conversation yesterday with her sisters and palliative care provider - she tells me "yes, I'm going to rehab". I discussed this with her and explained they had talked about hospice and asked her about her wishes regarding continued aggressive care vs comfort care. Patient mentions she wants to go to rehab, then also mentions she would like to go to hospice. I asked about permanent feeding tube placement and she does not give a clear answer - confused by question. It does not appear that patient has capacity for decision making at this time. Will continue [place] conversations with sisters.   Length of Stay: 13  Current Medications: Scheduled Meds:  . [START ON 11/26/2018] amiodarone  100 mg Per Tube Daily  . atorvastatin  40 mg Per Tube q1800  . budesonide (PULMICORT) nebulizer solution  0.25 mg Nebulization BID  . chlorhexidine  15 mL Mouth Rinse BID  . Chlorhexidine Gluconate Cloth  6 each Topical Daily  . chlorpheniramine-HYDROcodone  5 mL Oral Q12H  . feeding supplement (PRO-STAT SUGAR FREE 64)  30 mL Per Tube Daily  . fluconazole  100 mg Oral Daily  . furosemide  40 mg Per Tube Daily  . ipratropium-albuterol  3 mL Nebulization BID  . mouth rinse  15 mL Mouth Rinse q12n4p  . metoprolol tartrate  12.5 mg Per Tube BID  . potassium chloride  10 mEq Oral Q M,W,F  . pyridostigmine  30 mg Per Tube BID  . sodium  chloride flush  10-40 mL Intracatheter Q12H    Continuous Infusions: . feeding supplement (OSMOLITE 1.2 CAL) 60 mL/hr at 11/24/18 2100  . heparin 850 Units/hr (11/25/18 1124)    PRN Meds: morphine injection, oxyCODONE, sodium chloride flush, traMADol  Physical Exam Constitutional:      General: She is not in acute distress. HENT:     Head: Normocephalic and atraumatic.     Nose:     Comments: Feeding tube in place during my evaluation Cardiovascular:     Rate and Rhythm: Regular rhythm. Bradycardia present.  Pulmonary:     Effort: Pulmonary effort is normal.     Breath sounds: Normal breath sounds.  Abdominal:     Palpations: Abdomen is soft.  Skin:    General: Skin is warm and dry.  Neurological:     Mental Status: She is alert.     Comments: Oriented x3, easily confused             Vital Signs: BP 102/61 (BP Location: Left Arm)   Pulse (!) 53   Temp 98.4 F (36.9 C) (Axillary)   Resp 18   Ht '5\' 5"'  (1.651 m)   Wt 71.1 kg   SpO2 99%   BMI 26.08 kg/m  SpO2: SpO2: 99 % O2 Device: O2 Device: Room [employer]  O2 Flow Rate: O2 Flow Rate (L/min): 3 L/min  Intake/output summary:   Intake/Output Summary (Last 24 hours) at 11/25/2018 1236 Last data filed at 11/25/2018 1143 Gross per 24 hour  Intake 730 ml  Output 1300 ml  Net -570 ml   LBM: Last BM Date: 11/25/18 Baseline Weight: Weight: 74 kg Most recent weight: Weight: 71.1 kg       Palliative Assessment/Data: PPS 10%    Flowsheet Rows     Most Recent Value  Intake Tab  Referral Department  Hospitalist  Unit at Time of Referral  ICU  Palliative Care Primary Diagnosis  Cardiac  Date Notified  11/17/18  Palliative Care Type  New Palliative care  Reason for referral  Clarify Goals of Care  Date of Admission  11/12/18  Date first seen by Palliative Care  11/17/18  # of days Palliative referral response time  0 Day(s)  # of days IP prior to Palliative referral  5  Clinical Assessment  Psychosocial & Spiritual  Assessment  Palliative Care Outcomes      Patient Active Problem List   Diagnosis Date Noted  . Thrush   . Closed fracture of multiple ribs   . Pneumonia   . Acute respiratory failure with hypoxia ([place])   . AKI (acute kidney injury) ([place])   . Advanced care planning/counseling discussion   . Goals of care, counseling/discussion   . Palliative care by specialist   . Cardiac arrest with ventricular fibrillation ([place]) 11/12/2018  . Hypotension   . Septic shock ([place]) 10/23/2018  . Acute kidney injury superimposed on chronic kidney disease ([place]) 10/22/2018  . Colitis 10/22/2018  . Bradycardia 10/22/2018  . AF (paroxysmal atrial fibrillation) ([place]) 10/22/2018  . SVT (supraventricular tachycardia) ([place]) 10/08/2018  . Atrial fibrillation with RVR ([place]) 12020/07/2718  . Precordial chest pain 09/15/2018  . Multiple myeloma in relapse ([place]) 06/15/2018  . Encounter for central line placement 06/15/2018  . Hematochezia   . Acute blood loss anemia   . Diverticulosis of colon with hemorrhage   . GIB (gastrointestinal bleeding) 09/03/2017  . Symptomatic anemia 09/03/2017  . Acute systolic heart failure ([place]) 09/03/2017  . Chronic kidney disease (CKD), stage III (moderate) ([place]) 09/03/2017  . Hyperlipidemia 09/03/2017  . Multiple myeloma in remission ([place]) 09/03/2017  . GI bleed 08/17/2017  . Sepsis ([place]) 08/17/2017  . Fever 08/16/2017  . STEMI (ST elevation myocardial infarction) ([place]) 02/24/2017  . Acute anterolateral wall MI ([place]) 02/24/2017  . Dehydration 01/21/2017  . Acute coronary syndrome ([place]) 12/24/2016  . Dizziness 12/22/2016    Class: Acute  . Orthostatic hypotension 12/08/2016  . Syncope and collapse 12/06/2016  . Tachycardia-bradycardia ([place])   . Essential hypertension   . Nausea and vomiting in adult patient   . Erosive esophagitis   . Protein-calorie malnutrition, severe 11/19/2016  . LLQ pain   . LUQ abdominal pain   . Intractable nausea and vomiting 11/17/2016  .  Abdominal pain 11/05/2016  . Multiple myeloma ([place]) 06/18/2016  . Malnutrition of moderate degree 06/14/2016  . 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 Exertional dyspnea 11/01/2015  . Coronary atherosclerosis of native coronary artery 11/01/2015  . Shortness of breath 11/01/2015  . Chest pain 12/31/2011  . Coronary artery disease 10/20/1997    Palliative Care Assessment & Plan   HPI: 73 y.o.femalewith past medical history of myasthenia gravis, a. Fib w/ RVR, prolonged QT, CKD III, multiple myeloma with diffuse lytic lesions- (chemo recently held due  to poor performance status, weight loss), recent hospitalization d/t septic shock from infectious colitis (discharged home on 1/14- of note during that hospitalization patient stated she did not want aggressive or artificial life support measures per 1/3 note from Dr. Nicole Cella on 1/24/2020with cardiac arrest x2 s/p CPR x 1 for 2 minutes and then x 10 minutes with intubation.She was noted to be posturing and had rightsided gaze preference in the ED. She is now extubated, however, mental status is slow to improve. ECHO showed 20-25% EF with diffuse hypokinesis. Palliative medicine consulted for [place].  Assessment: Follow up today with patient and her sister, Abigail [name]. Patient reports better control of symptoms with morphine. When we discussed [place] she is easily confused and switches between telling me she wants hospice and then says rehab. Not sure she currently has the capacity to make decisions.  Spoke with patient's sister Abigail [name] later in the day. I shared with her that patient had been evaluated by SLP and did well. Shared that the medical team is hopeful to remove feeding tube and start her on a diet. We discussed seeing how patient does with her diet and without a feeding tube. If she does well, Abigail [name] would like to move forward with rehab placement.   Recommendations/Plan:  Remove feeding tube and  trial diet  Family would like to move forward with rehab placement if patient does well with diet  Encourage use of oxycodone and wean use of IV morphine  Continue diflucan  PMT to shadow for improvement or decline  If patient does go to rehab, recommend outpatient palliative to follow   Code Status:  DNR  Prognosis:   Unable to determine  Discharge Planning:  To Be Determined  Care plan was discussed with patient's sister, Abigail [name]  Thank you for allowing the Palliative Medicine Team to assist in the care of this patient.   Total Time 35 minutes Prolonged Time Billed  no       Greater than 50%  of this time was spent counseling and coordinating care related to the above assessment and plan.  Juel Burrow, DNP, [facility] Palliative Medicine Team Team Phone # [phone]  Pager [phone]

## 2018-11-25 NOTE — Progress Notes (Signed)
  Speech Language Pathology Treatment: Dysphagia  Patient Details Name: Abigail [name] MRN: [id number] DOB: Feb 03, 1946 Today's Date: 11/25/2018 Time: 1000-1010 SLP Time Calculation (min) (ACUTE ONLY): 10 min  Assessment / Plan / Recommendation Clinical Impression  Pt is more alert and amenable to eating.  She was willing to drink water, consumed several bites of pudding with improved attention, and no overt s/s of aspiration.  MBS on 1/30 showed normal pharyngeal swallow and good airway protection, with dysphagia related primarily to cognition, poor attention (leading to oral holding).  Given improved mentation, recommend discontinuing cortrak to optimize comfort and motivation.  Continue purees/thin liquids until pt is more consistently participatory; then nursing may advance diet according to pt's preferences. No further SLP needs are identified - our service will sign off. D/W Dr. Lonny Prude.   HPI HPI: Pt is a 73 yo female admitted post VT/VF arrest x2, shocked 3x and intubated in the field with approximately 12 minutes of cardiac arrest prior to achieving ROSC.  Upon arrival to the ED she was posturing with a R sided gaze preference. ETT 1/24-1/27. PMH: GERD, schatzki's ring, multiple myeloma, FTT, recent hospitalization for infectious colitis and septic shock, myasthenia gravis, afib, CKD, MI, HTN, HLD, CAD      SLP Plan  Discharge SLP treatment due to (comment) goal met       Recommendations  Diet recommendations: Dysphagia 1 (puree);Thin liquid Liquids provided via: Cup;Straw Medication Administration: Crushed with puree Supervision: Staff to assist with self feeding;Trained caregiver to feed patient Compensations: Minimize environmental distractions                Oral Care Recommendations: Oral care BID SLP Visit Diagnosis: Dysphagia, oral phase (R13.11) Plan: Discharge SLP treatment due to (comment)       GO               Abigail [name], [place] CCC/SLP Acute Rehabilitation  Services Office number [phone] Pager [phone]  Abigail [name] 11/25/2018, 10:23 AM

## 2018-11-25 NOTE — Progress Notes (Signed)
ANTICOAGULATION CONSULT NOTE - Follow Up Consult  Pharmacy Consult:  Heparin Indication:  Acute DVT  Allergies  Allergen Reactions  . Sulfa Antibiotics Rash    Patient Measurements: Height: 5\' 5"  (165.1 cm) Weight: 156 lb 12 oz (71.1 kg) IBW/kg (Calculated) : 57 Heparin Dosing Weight: 72 kg  Vital Signs: Temp: 99.9 F (37.7 C) (02/06 0441) Temp Source: Oral (02/06 0441) BP: 119/55 (02/06 0441) Pulse Rate: 61 (02/06 0441)  Labs: Recent Labs    11/23/18 0559  11/24/18 0500 11/24/18 1656 11/25/18 0347  HGB 8.2*  --  8.0*  --  8.4*  HCT 26.4*  --  24.7*  --  25.9*  PLT 148*  --  159  --  164  HEPARINUNFRC  --    < > 0.29* 0.20* 0.42  CREATININE 1.22*  --  1.34*  --   --    < > = values in this interval not displayed.    Estimated Creatinine Clearance: 38.1 mL/min (A) (by C-G formula based on SCr of 1.34 mg/dL (H)).   Assessment: 68 YOF with history of Afib not on anticoagulation PTA due to history of GIB.  Patient admitted s/p cardiac arrest, then developed an acute DVT and was started on IV heparin.  She subsequently had a RLE hematoma and heparin was held.  Hematoma improving and Pharmacy consulted to restart IV heparin without boluses.    Heparin level therapeutic (0.42) on gtt at 850 units/hr. No bleeding noted.  Goal of Therapy:  Heparin level 0.3-0.5 units/ml (lower range d/t hematoma) Monitor platelets by anticoagulation protocol: Yes   Plan:  - Continue heparin drip at 850 units/hr. - F/u heparin level in 6 hours to confirm therapeutic  Thank you for allowing pharmacy to be a part of this patient's care.  Sherlon Handing, PharmD, BCPS Clinical pharmacist  **Pharmacist phone directory can now be found on [email or web address] ([email or web address]).  Listed under [place]. 11/25/2018 5:10 AM

## 2018-11-25 NOTE — Progress Notes (Signed)
ANTICOAGULATION CONSULT NOTE  Pharmacy Consult:  Heparin Indication:  Acute DVT  Allergies  Allergen Reactions  . Sulfa Antibiotics Rash    Patient Measurements: Height: 5\' 5"  (165.1 cm) Weight: 156 lb 12 oz (71.1 kg) IBW/kg (Calculated) : 57 Heparin Dosing Weight: 72 kg  Vital Signs: Temp: 98.4 F (36.9 C) (02/06 1148) Temp Source: Axillary (02/06 1148) BP: 102/61 (02/06 1148) Pulse Rate: 53 (02/06 1148)  Labs: Recent Labs    11/23/18 0559  11/24/18 0500 11/24/18 1656 11/25/18 0347 11/25/18 1131  HGB 8.2*  --  8.0*  --  8.4*  --   HCT 26.4*  --  24.7*  --  25.9*  --   PLT 148*  --  159  --  164  --   HEPARINUNFRC  --    < > 0.29* 0.20* 0.42 0.41  CREATININE 1.22*  --  1.34*  --   --   --    < > = values in this interval not displayed.    Estimated Creatinine Clearance: 38.1 mL/min (A) (by C-G formula based on SCr of 1.34 mg/dL (H)).   Assessment: 61 YOF with history of Afib not on anticoagulation PTA due to history of GIB.  Patient admitted s/p cardiac arrest, then developed an acute DVT and was started on IV heparin.  She subsequently had a RLE hematoma and heparin was held.  Hematoma improving and Pharmacy consulted to restart IV heparin without boluses.    Heparin level therapeutic; no bleeding noted.   Goal of Therapy:  Heparin level 0.3-0.5 units/ml (lower range d/t hematoma) Monitor platelets by anticoagulation protocol: Yes   Plan:  Continue heparin gtt at 850 units/hr, no bolus with hematoma/anemia Daily heparin level and CBC   Addylynn Balin D. Mina Marble, PharmD, BCPS, [place] 11/25/2018, 12:11 PM

## 2018-11-25 NOTE — Progress Notes (Signed)
Attempted to irrigate flexiseal but the pouch came out, attempted to reinsert x 2 but her stool in her rectum is a thicker mushy stool and I think that the flexiseal will keep the stool from being able to come out, will continue to monitor. Patient asked for something for pain, will give her some Morphine when we get her cleaned up.

## 2018-11-25 NOTE — Clinical Social Work Note (Signed)
Clinical Social Work Assessment  Patient Details  Name: Abigail [name] MRN: [id number] Date of Birth: 02/11/1946  Date of referral:  11/25/18               Reason for consult:  Facility Placement, Discharge Planning                Permission sought to share information with:  Facility Contact Representative, Family Supports Permission granted to share information::     Name::     Dorothy  Agency::  SNFs  Relationship::  sister  Contact Information:  [phone]  Housing/Transportation Living arrangements for the past 2 months:  Single Family Home Source of Information:  Siblings Patient Interpreter Needed:  None Criminal Activity/Legal Involvement Pertinent to Current Situation/Hospitalization:  No - Comment as needed Significant Relationships:  Siblings Lives with:  Siblings Do you feel safe going back to the place where you live?  Yes Need for family participation in patient care:  Yes (Comment)  Care giving concerns: Patient from home with sister. PT recommending SNF. Palliaitve has also been following for GOC. Plan to advance diet from cortrak.   Social Worker assessment / plan: CSW met with patient's sister, Dorothy at bedside on 2/4 and discussed disposition planning on the phone today. Sister confirmed conversation with palliative today and plan to remove cortrak and advance diet. Sister stated she wants patient to go to rehab if she is able. Patient documented as oriented x4, but per palliative, question decision making capacity.  Will need updated PT evaluation and hopefully patient will be able to participate well with therapy well. Will need [employer] authorization prior to admitting to a facility. CSW to initiate [employer] auth when updated PT notes in. Will also fax out initial SNF referrals.   CSW to follow and support with discharge planning.   Employment status:  Retired Insurance information:  Managed Medicare([employer]) PT Recommendations:  Skilled Nursing  Facility Information / Referral to community resources:  Skilled Nursing Facility  Patient/Family's Response to care: Family appreciative of care.  Patient/Family's Understanding of and Emotional Response to Diagnosis, Current Treatment, and Prognosis: Family with some understanding of patient's condition. They prefer patient go to rehab at discharge.  Emotional Assessment Appearance:  Appears stated age Attitude/Demeanor/Rapport:  Unable to Assess Affect (typically observed):  Unable to Assess Orientation:  Oriented to Self, Oriented to Place Alcohol / Substance use:  Not Applicable Psych involvement (Current and /or in the community):  No (Comment)  Discharge Needs  Concerns to be addressed:  Discharge Planning Concerns, Care Coordination Readmission within the last 30 days:  Yes Current discharge risk:  Cognitively Impaired, Physical Impairment, Dependent with Mobility Barriers to Discharge:  Continued Medical Work up, Insurance Authorization   Susan Porter, LCSW 11/25/2018, 3:08 PM  

## 2018-11-25 NOTE — [place] FL2 (Signed)
[place] MEDICAID FL2 LEVEL OF CARE SCREENING TOOL     IDENTIFICATION  Patient Name: Abigail [name] Birthdate: July 23, 1946 Sex: female Admission Date (Current Location): 11/12/2018  [facility] and [place] Number:  Herbalist and Address:  [facility] [place], [place] [address] [place], [place] [address]      Provider Number: [id number]  Attending Physician Name and Address:  Mariel Aloe, MD  Relative Name and Phone Number:  Suanne Marker, sister, [phone]    Current Level of Care: Hospital Recommended Level of Care: [place] Prior Approval Number:    Date Approved/Denied:   PASRR Number: [id number]  Discharge Plan: SNF    Current Diagnoses: Patient Active Problem List   Diagnosis Date Noted  . Thrush   . Closed fracture of multiple ribs   . Pneumonia   . Acute respiratory failure with hypoxia ([place])   . AKI (acute kidney injury) ([place])   . Advanced care planning/counseling discussion   . Goals of care, counseling/discussion   . Palliative care by specialist   . Cardiac arrest with ventricular fibrillation ([place]) 11/12/2018  . Hypotension   . Septic shock ([place]) 10/23/2018  . Acute kidney injury superimposed on chronic kidney disease ([place]) 10/22/2018  . Colitis 10/22/2018  . Bradycardia 10/22/2018  . AF (paroxysmal atrial fibrillation) ([place]) 10/22/2018  . SVT (supraventricular tachycardia) ([place]) 10/08/2018  . Atrial fibrillation with RVR ([place]) [id number]  . Precordial chest pain 09/15/2018  . Multiple myeloma in relapse ([place]) 06/15/2018  . Encounter for central line placement 06/15/2018  . Hematochezia   . Acute blood loss anemia   . Diverticulosis of colon with hemorrhage   . GIB (gastrointestinal bleeding) 09/03/2017  . Symptomatic anemia 09/03/2017  . Acute systolic heart failure ([place]) 09/03/2017  . Chronic kidney disease (CKD), stage III (moderate) ([place]) 09/03/2017  . Hyperlipidemia 09/03/2017  . Multiple myeloma  in remission ([place]) 09/03/2017  . GI bleed 08/17/2017  . Sepsis ([place]) 08/17/2017  . Fever 08/16/2017  . STEMI (ST elevation myocardial infarction) ([place]) 02/24/2017  . Acute anterolateral wall MI ([place]) 02/24/2017  . Dehydration 01/21/2017  . Acute coronary syndrome ([place]) 12/24/2016  . Dizziness 12/22/2016    Class: Acute  . Orthostatic hypotension 12/08/2016  . Syncope and collapse 12/06/2016  . Tachycardia-bradycardia ([place])   . Essential hypertension   . Nausea and vomiting in adult patient   . Erosive esophagitis   . Protein-calorie malnutrition, severe 11/19/2016  . LLQ pain   . LUQ abdominal pain   . Intractable nausea and vomiting 11/17/2016  . Abdominal pain 11/05/2016  . Multiple myeloma ([place]) 06/18/2016  . Malnutrition of moderate degree 06/14/2016  . 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 Exertional dyspnea 11/01/2015  . Coronary atherosclerosis of native coronary artery 11/01/2015  . Shortness of breath 11/01/2015  . Chest pain 12/31/2011  . Coronary artery disease 10/20/1997    Orientation RESPIRATION BLADDER Height & Weight     Self, Time, Situation, Place  Normal Incontinent, External catheter Weight: 71.1 kg Height:  '5\' 5"'  (165.1 cm)  BEHAVIORAL SYMPTOMS/MOOD NEUROLOGICAL BOWEL NUTRITION STATUS      Incontinent Diet(currently has cortrak but plan to remove and advance diet prior to discharge)  AMBULATORY STATUS COMMUNICATION OF NEEDS Skin   Extensive Assist Verbally Normal                       Personal Care Assistance Level of Assistance  Bathing, Feeding, Dressing Bathing Assistance: Maximum assistance Feeding assistance: Limited assistance Dressing Assistance: Maximum assistance     Functional Limitations Info  Sight, Hearing, Speech Sight Info: Impaired Hearing Info: Impaired Speech Info: Adequate    SPECIAL CARE FACTORS FREQUENCY  PT (By licensed PT), OT (By licensed OT)     PT Frequency: 5x/week OT  Frequency: 5x/week            Contractures Contractures Info: Not present    Additional Factors Info  Code Status, Allergies Code Status Info: DNR Allergies Info: Sulfa Antibiotics           Current Medications (11/25/2018):  This is the current hospital active medication list Current Facility-Administered Medications  Medication Dose Route Frequency Provider Last Rate Last Dose  . [START ON 11/26/2018] amiodarone (PACERONE) tablet 100 mg  100 mg Per Tube Daily [name], MD      . atorvastatin (LIPITOR) tablet 40 mg  40 mg Per Tube q1800 Cherene Altes, MD   40 mg at 11/24/18 1703  . budesonide (PULMICORT) nebulizer solution 0.25 mg  0.25 mg Nebulization BID Cherene Altes, MD   0.25 mg at 11/25/18 0724  . chlorhexidine (PERIDEX) 0.12 % solution 15 mL  15 mL Mouth Rinse BID [name], MD   15 mL at 11/25/18 0820  . Chlorhexidine Gluconate Cloth 2 % PADS 6 each  6 each Topical Daily [name], MD   6 each at 11/25/18 [phone]  . chlorpheniramine-HYDROcodone (TUSSIONEX) 10-8 MG/5ML suspension 5 mL  5 mL Oral Q12H Earlie Counts, NP   5 mL at 11/25/18 0821  . feeding supplement (OSMOLITE 1.2 CAL) liquid 1,000 mL  1,000 mL Per Tube Continuous [name], MD 60 mL/hr at 11/24/18 2100    . feeding supplement (PRO-STAT SUGAR FREE 64) liquid 30 mL  30 mL Per Tube Daily [name], MD   30 mL at 11/25/18 0820  . fluconazole (DIFLUCAN) tablet 100 mg  100 mg Oral Daily Mariel Aloe, MD   100 mg at 11/25/18 6283  . furosemide (LASIX) 10 MG/ML solution 40 mg  40 mg Per Tube Daily Cherene Altes, MD   40 mg at 11/25/18 6629  . heparin ADULT infusion 100 units/mL (25000 units/225m sodium chloride 0.45%)  850 Units/hr Intravenous Continuous MLavenia Atlas RPH 8.5 mL/hr at 11/25/18 1124 850 Units/hr at 11/25/18 1124  . ipratropium-albuterol (DUONEB) 0.5-2.5 (3) MG/3ML nebulizer solution 3 mL  3 mL Nebulization BID MCherene Altes MD   3 mL at 11/25/18 0724  .  MEDLINE mouth rinse  15 mL Mouth Rinse q12n4p KDixie Dials MD   15 mL at 11/25/18 1132  . metoprolol tartrate (LOPRESSOR) tablet 12.5 mg  12.5 mg Per Tube BID KDixie Dials MD   12.5 mg at 11/25/18 0820  . morphine 2 MG/ML injection 2 mg  2 mg Intravenous Q2H PRN MEarlie Counts NP   2 mg at 11/25/18 1132  . oxyCODONE (Oxy IR/ROXICODONE) immediate release tablet 5-10 mg  5-10 mg Per Tube Q4H PRN MCherene Altes MD   10 mg at 11/23/18 2349  . potassium chloride 20 MEQ/15ML (10%) solution 10 mEq  10 mEq Oral Q M,W,F KDixie Dials MD   10 mEq at 11/25/18 1050  . pyridostigmine (MESTINON) 60 MG/5ML solution 30 mg  30 mg Per Tube BID MCherene Altes MD   30 mg at 11/25/18 04765 . sodium chloride flush (NS) 0.9 % injection 10-40 mL  10-40 mL Intracatheter Q12H [name], MD   10 mL at 11/25/18 0823  . sodium chloride flush (NS) 0.9 % injection 10-40 mL  10-40 mL Intracatheter PRN [name], MD      . traMADol Veatrice Bourbon) tablet 50 mg  50 mg Per Tube Q6H PRN Cherene Altes, MD         Discharge Medications: Please see discharge summary for a list of discharge medications.  Relevant Imaging Results:  Relevant Lab Results:   Additional Information SSN: [id number]  Estanislado Emms, LCSW

## 2018-11-25 NOTE — Progress Notes (Signed)
PROGRESS NOTE    Abigail [name]  VZS:[id number] DOB: 12-21-1945 DOA: 11/12/2018 PCP: Nolene Ebbs, MD   Brief Narrative: Abigail [name] is a 73 y.o. womanwith a hx ofmyasthenia gravis, atrial fibrillation, prolonged QTC, CKD,multiple myeloma, and generalized FTT. Patient presented secondary to VT/VF arrest x2. Defibrillated 3 times. Intubated prior to arrival to hospital.    Assessment & Plan:   Active Problems:   Encounter for central line placement   Cardiac arrest with ventricular fibrillation (HCC)   Acute respiratory failure with hypoxia (HCC)   AKI (acute kidney injury) ([place])   Advanced care planning/counseling discussion   Goals of care, counseling/discussion   Palliative care by specialist   Thrush   Closed fracture of multiple ribs   Pneumonia   V-tach/V-fib arrest Evaluated by EP and not a candidate for an AICD.  Tachy-brady syndrome EP consulted. Initially on amiodarone which was discontinued secondary to hypotension. Has been stable on amiodarone PO. -Cardiology recommendations: Decreasing amiodarone today secondary to borderline prolonged QTc  Acute respiratory failure with hypoxia Resolved.  Encephalopathy Anoxic vs metabolic. Appears to be improving so possibly metabolic.   Moderate malnutrition Currently with an NG tube in place on tube feeds. At this time, she would probably do well without NG and encourage PO nutrition -SLP recommendations, then remove NG tube and advance diet -Nutrition consult  Multiple myeloma Diffuse lytic bone lesions. Last received Zometa in December. Chemo on hold  Myasthenia gravis -Continue pyridostigmine  Right lower leg hematoma  Acute RLE DVT Currently on heparin drip. Hemoglobin stable. -Will transition to oral at some point  Macrocytic anemia Acute on chronic.   Acute kidney injury on CKD III Baseline creatinine of 1.4. Peak creatinine of 1.86. improved.  CAD Cardiology on board. S/p PTCA to LAD/LCx in past.  No cath performed this admission.   Generalized pain -Continue Oxycodone, tramadol, morphine prn   DVT prophylaxis: SCDs Code Status:   Code Status: DNR Family Communication: None at bedside Disposition Plan: Discharge likely to SNF when medically more stable (eating by mouth, pain controlled, goals of care discussions)   Consultants:   Cardiology  Palliative care  Procedures:   None  Antimicrobials:  Cefazolin    Subjective: Some leg pain. Cough with yellow sputum. No chest pain or dyspnea.  Objective: Vitals:   11/25/18 0441 11/25/18 0724 11/25/18 0726 11/25/18 0816  BP: (!) 119/55   112/60  Pulse: 61   61  Resp: (!) 21   18  Temp: 99.9 F (37.7 C)   98.6 F (37 C)  TempSrc: Oral   Axillary  SpO2: 94% 94% 94% 94%  Weight:      Height:        Intake/Output Summary (Last 24 hours) at 11/25/2018 1023 Last data filed at 11/25/2018 0558 Gross per 24 hour  Intake 730 ml  Output 550 ml  Net 180 ml   Filed Weights   11/21/18 0358 11/22/18 0341 11/23/18 0500  Weight: 74.3 kg 75.4 kg 71.1 kg    Examination:  General exam: Appears calm and comfortable Respiratory system: Coarse breath sounds, diminished. Respiratory effort normal. Cardiovascular system: S1 & S2 heard, RRR. No murmurs, rubs, gallops or clicks. Gastrointestinal system: Abdomen is nondistended, soft and nontender. No organomegaly or masses felt. Normal bowel sounds heard. Central nervous system: Alert and oriented. Extremities: No edema. No calf tenderness Skin: No cyanosis. No rashes Psychiatry: Judgement and insight appear normal. Flat affect. Somewhat slow to answer questions.    Data Reviewed:  I have personally reviewed following labs and imaging studies  CBC: Recent Labs  Lab 11/21/18 0430 11/22/18 0327 11/23/18 0559 11/24/18 0500 11/25/18 0347  WBC 14.1* 15.7* 11.5* 11.1* 10.3  HGB 6.8* 6.9* 8.2* 8.0* 8.4*  HCT 23.3* 24.3* 26.4* 24.7* 25.9*  MCV 100.9* 102.5* 94.3 92.5 92.2    PLT 159 169 148* 159 974   Basic Metabolic Panel: Recent Labs  Lab 11/20/18 0530 11/21/18 0430 11/22/18 0327 11/23/18 0559 11/24/18 0500  NA 149* 149* 147* 144 142  K 3.3* 4.9 5.5* 4.3 3.7  CL 125* 121* 120* 113* 107  CO2 17* 19* 19* 22 25  GLUCOSE 120* 116* 119* 85 95  BUN _0 27* 26*  CREATININE 1.28* 1.17* 1.05* 1.22* 1.34*  CALCIUM 7.4* 8.1* 8.2* 8.1* 7.9*  PHOS  --   --   --   --  3.6   GFR: Estimated Creatinine Clearance: 38.1 mL/min (A) (by C-G formula based on SCr of 1.34 mg/dL (H)). Liver Function Tests: Recent Labs  Lab 11/21/18 0430 11/24/18 0500  AST 17  --   ALT 11  --   ALKPHOS 78  --   BILITOT 0.3  --   PROT 5.0*  --   ALBUMIN 2.2* 2.0*   No results for input(s): LIPASE, AMYLASE in the last 168 hours. No results for input(s): AMMONIA in the last 168 hours. Coagulation Profile: No results for input(s): INR, PROTIME in the last 168 hours. Cardiac Enzymes: No results for input(s): CKTOTAL, CKMB, CKMBINDEX, TROPONINI in the last 168 hours. BNP (last 3 results) No results for input(s): PROBNP in the last 8760 hours. HbA1C: No results for input(s): HGBA1C in the last 72 hours. CBG: Recent Labs  Lab 11/18/18 2012 11/19/18 0018 11/19/18 0359 11/19/18 0750 11/19/18 1103  GLUCAP 122* 96 112* 89 95   Lipid Profile: No results for input(s): CHOL, HDL, LDLCALC, TRIG, CHOLHDL, LDLDIRECT in the last 72 hours. Thyroid Function Tests: No results for input(s): TSH, T4TOTAL, FREET4, T3FREE, THYROIDAB in the last 72 hours. Anemia Panel: No results for input(s): VITAMINB12, FOLATE, FERRITIN, TIBC, IRON, RETICCTPCT in the last 72 hours. Sepsis Labs: No results for input(s): PROCALCITON, LATICACIDVEN in the last 168 hours.  No results found for this or any previous visit (from the past 240 hour(s)).       Radiology Studies: Dg Chest 2 View  Result Date: 11/24/2018 CLINICAL DATA:  Pneumonia, coronary artery disease post MI, stage III chronic kidney  disease, hypertension, multiple myeloma EXAM: CHEST - 2 VIEW COMPARISON:  11/18/2018 FINDINGS: Feeding tube extends into stomach. RIGHT arm PICC line tip projects over SVC. Enlargement of cardiac silhouette with pulmonary vascular congestion. Atherosclerotic calcification aorta. Bibasilar opacities question infiltrate versus atelectasis. No definite pleural effusion or pneumothorax. Bones demineralized. Lytic foci are identified at the RIGHT clavicle, lateral LEFT sixth rib, question myeloma. Fractures of the anterior LEFT fifth and sixth ribs identified. IMPRESSION: Bibasilar opacities RIGHT greater than LEFT question atelectasis versus infiltrate. LEFT rib fractures with lytic foci at the RIGHT clavicle and lateral LEFT sixth rib question myeloma. Electronically Signed   By: Lavonia Dana M.D.   On: 11/24/2018 12:05        Scheduled Meds: . [START ON 11/26/2018] amiodarone  100 mg Per Tube Daily  . atorvastatin  40 mg Per Tube q1800  . budesonide (PULMICORT) nebulizer solution  0.25 mg Nebulization BID  . chlorhexidine  15 mL Mouth Rinse BID  . Chlorhexidine Gluconate Cloth  6 each Topical  Daily  . chlorpheniramine-HYDROcodone  5 mL Oral Q12H  . feeding supplement (PRO-STAT SUGAR FREE 64)  30 mL Per Tube Daily  . fluconazole  100 mg Oral Daily  . furosemide  40 mg Per Tube Daily  . ipratropium-albuterol  3 mL Nebulization BID  . mouth rinse  15 mL Mouth Rinse q12n4p  . metoprolol tartrate  12.5 mg Per Tube BID  . potassium chloride  10 mEq Oral Q M,W,F  . pyridostigmine  30 mg Per Tube BID  . sodium chloride flush  10-40 mL Intracatheter Q12H   Continuous Infusions: . feeding supplement (OSMOLITE 1.2 CAL) 60 mL/hr at 11/24/18 2100  . heparin 850 Units/hr (11/25/18 0512)     LOS: 13 days     Cordelia Poche, MD [facility] 11/25/2018, 10:23 AM  If 7PM-7AM, please contact night-coverage [email or web address]

## 2018-11-25 NOTE — Progress Notes (Signed)
Occupational Therapy Treatment Patient Details Name: Abigail [name] MRN: [id number] DOB: 1946/09/17 Today's Date: 11/25/2018    History of present illness Pt is a 73 y.o. F with significant PMH of CKD stage III, myasthenia gravis, multiple myeloma, failure to thrive with recent hospital admission for infectious colitis and septic shock who was admitted after VT/VF arrest with overall time to ROSC estimated at 12 minutes. Intubated 1/24-1/27. Also found to have RLE DVT.    OT comments  Pt performing bed mobility and transfers with modA +1 and stability with RW, side steps taken up towards head of bed. Pt performing grooming sitting EOB with tactile cues. Pt sitting EOB with fair balance x10 mins. Pt limited due to poor cognitive status and little carryover skills from session to session. Pt following commands intermittently re. Pt would continue to benefit from continued OT skilled services for ADL, mobility and safety in SNF setting.      SNF;Supervision/Assistance - 24 hour    Equipment Recommendations  Other (comment)(TBD at next venue)    Recommendations for Other Services      Precautions / Restrictions Precautions Precautions: Fall Precaution Comments: pain with BLE movements Restrictions Weight Bearing Restrictions: No       Mobility Bed Mobility Overal bed mobility: Needs Assistance Bed Mobility: Supine to Sit;Sit to Supine Rolling: Mod assist Sidelying to sit: Mod assist Supine to sit: Mod assist Sit to supine: Max assist   General bed mobility comments: Pt tolerating  Transfers Overall transfer level: Needs assistance Equipment used: Rolling walker (2 wheeled) Transfers: Sit to/from Stand Sit to Stand: Mod assist;From elevated surface         General transfer comment: sit to stands performed and side stepping towards [facility]    Balance Overall balance assessment: Needs assistance   Sitting balance-Leahy Scale: Fair       Standing balance-Leahy Scale:  Poor Standing balance comment: Requires BUE support on RW.                           ADL either performed or assessed with clinical judgement   ADL Overall ADL's : Needs assistance/impaired                                     Functional mobility during ADLs: Moderate assistance;Rolling walker(sit to stands) General ADL Comments: Pt performing light grooming with tactile cues provided sitting EOB.     Vision   Vision Assessment?: No apparent visual deficits   Perception     Praxis      Cognition Arousal/Alertness: Lethargic(requiring cues to stay upright and keep eyes open) Behavior During Therapy: Flat affect Overall Cognitive Status: Impaired/Different from baseline Area of Impairment: Orientation;Attention;Problem solving;Following commands;Awareness                 Orientation Level: Disoriented to;Time;Situation     Following Commands: Follows one step commands inconsistently       General Comments: Pt sporadically saying 1 word at a time, otherwise, pt silent. Pt following 1 step commands intermittently.        Exercises     Shoulder Instructions       General Comments      Pertinent Vitals/ Pain       Pain Assessment: Faces Faces Pain Scale: Hurts little more Pain Location: RLE with movement Pain Descriptors / Indicators: Grimacing;Moaning Pain Intervention(s): Limited activity  within patient's tolerance;Repositioned  Home Living                                          Prior Functioning/Environment              Frequency  Min 2X/week        Progress Toward Goals  OT Goals(current goals can now be found in the care plan section)  Progress towards OT goals: Progressing toward goals  Acute Rehab OT Goals Patient Stated Goal: unable to state OT Goal Formulation: Patient unable to participate in goal setting Time For Goal Achievement: 12/01/18 Potential to Achieve Goals: Fair ADL  Goals Pt Will Perform Eating: with min assist;sitting Pt Will Perform Grooming: with mod assist;sitting Pt Will Perform Upper Body Dressing: with mod assist;sitting Pt Will Transfer to Toilet: with +2 assist;with mod assist Pt/caregiver will Perform Home Exercise Program: Increased strength;Both right and left upper extremity;With Supervision Additional ADL Goal #1: Pt will follow one step commands consistently. Additional ADL Goal #2: Pt will perform bed mobility with moderate assistance in preparation for ADL.  Plan Discharge plan remains appropriate    Co-evaluation                 AM-PAC OT "6 Clicks" Daily Activity     Outcome Measure     Help from another person taking care of personal grooming?: Total   Help from another person bathing (including washing, rinsing, drying)?: Total Help from another person to put on and taking off regular upper body clothing?: Total Help from another person to put on and taking off regular lower body clothing?: Total 6 Click Score: 4    End of Session Equipment Utilized During Treatment: Gait belt;Rolling walker  OT Visit Diagnosis: Pain;Muscle weakness (generalized) (M62.81);Other symptoms and signs involving cognitive function Pain - Right/Left: Right Pain - part of body: Leg   Activity Tolerance Patient limited by pain;Patient tolerated treatment well   Patient Left in bed;with call bell/phone within reach;with bed alarm set   Nurse Communication Mobility status        Time: 1400-1425 OT Time Calculation (min): 25 min  Charges: OT General Charges $OT Visit: 1 Visit OT Treatments $Self Care/Home Management : 8-22 mins $Neuromuscular Re-education: 8-22 mins  Darryl Nestle) Marsa Aris OTR/L Acute Rehabilitation Services Pager: [phone] Office: [phone]    Fredda Hammed 11/25/2018, 3:22 PM

## 2018-11-25 NOTE — Progress Notes (Addendum)
Physical Therapy Treatment Patient Details Name: Abigail [name] MRN: [id number] DOB: Jul 19, 1946 Today's Date: 11/25/2018    History of Present Illness Pt is a 73 y.o. female admitted 11/12/18 with VT/VF arrest, overall time to ROSC estimated 12 minutes; was noted to be posturing with R-side gaze preference in the ED. ETT 1/24-1/27. Also with RLE DVT. PMH Includes CKD III, myasthenia gravis, multiple myeloma, failure to thrive, recent admission for infectious colitis/septic shock.    PT Comments    Pt required modA to come to sitting, needing multimodal cues and hand-over-hand assist for sequencing. Minimal verbalizations and eye opening this session. Limited by fatigue, BLE pain and difficulty following commands. Able to participate somewhat with frequent, multimodal cues. Pt with incontinence, maxA+2 for pericare and repositioning in bed.     Follow Up Recommendations  SNF;Supervision/Assistance - 24 hour     Equipment Recommendations  None recommended by PT    Recommendations for Other Services       Precautions / Restrictions Precautions Precautions: Fall;Other (comment) Precaution Comments: pain with BLE movements; incontinence Restrictions Weight Bearing Restrictions: No    Mobility  Bed Mobility Overal bed mobility: Needs Assistance Bed Mobility: Supine to Sit;Rolling;Sit to Supine Rolling: Max assist Sidelying to sit: Mod assist Supine to sit: Mod assist Sit to supine: Max assist   General bed mobility comments: Max cues for pt to attempt sitting EOB, able to do so with HHA and modA for trunk elevation; difficult secondary to pain with all BLE movement. MaxA to assist BLEs to supine. Noted bed wet upon sitting; maxA+2 to roll R/L for washup and sheet change; pt able to participate moderately with frequent cues to situation and sequencing  Transfers Overall transfer level: Needs assistance Equipment used: Rolling walker (2 wheeled) Transfers: Sit to/from Stand Sit to Stand:  Mod assist;From elevated surface         General transfer comment: sit to stands performed and side stepping towards [facility]  Ambulation/Gait                 Stairs             Wheelchair Mobility    Modified Rankin (Stroke Patients Only)       Balance Overall balance assessment: Needs assistance Sitting-balance support: Feet supported;No upper extremity supported Sitting balance-Leahy Scale: Fair Sitting balance - Comments: Min guard to sit EOB      Standing balance-Leahy Scale: Poor Standing balance comment: Requires BUE support on RW.                            Cognition Arousal/Alertness: Lethargic Behavior During Therapy: Flat affect;Restless Overall Cognitive Status: No family/caregiver present to determine baseline cognitive functioning Area of Impairment: Attention;Following commands;Awareness;Problem solving                 Orientation Level: Disoriented to;Time;Situation Current Attention Level: Focused   Following Commands: Follows one step commands inconsistently   Awareness: Intellectual Problem Solving: Requires verbal cues;Decreased initiation General Comments: Only saying a few words during session with max cues; eyes closed majority of session. Following 1-step commands <10% of session      Exercises      General Comments        Pertinent Vitals/Pain Pain Assessment: Faces Faces Pain Scale: Hurts even more Pain Location: BLE to touch and movement Pain Descriptors / Indicators: Grimacing;Moaning;Guarding Pain Intervention(s): Limited activity within patient's tolerance;Repositioned    Home Living  Prior Function            PT Goals (current goals can now be found in the care plan section) Acute Rehab PT Goals Patient Stated Goal: unable to state PT Goal Formulation: Patient unable to participate in goal setting Time For Goal Achievement: 12/01/18 Potential to Achieve Goals:  Fair Progress towards PT goals: Progressing toward goals    Frequency    Min 2X/week      PT Plan Current plan remains appropriate    Co-evaluation              AM-PAC PT "6 Clicks" Mobility   Outcome Measure  Help needed turning from your back to your side while in a flat bed without using bedrails?: A Lot Help needed moving from lying on your back to sitting on the side of a flat bed without using bedrails?: A Lot Help needed moving to and from a bed to a chair (including a wheelchair)?: Total Help needed standing up from a chair using your arms (e.g., wheelchair or bedside chair)?: Total Help needed to walk in hospital room?: Total Help needed climbing 3-5 steps with a railing? : Total 6 Click Score: 8    End of Session   Activity Tolerance: Patient limited by pain;Other (comment)(cognition) Patient left: in bed;with call bell/phone within reach;with bed alarm set Nurse Communication: Mobility status PT Visit Diagnosis: Muscle weakness (generalized) (M62.81);Unsteadiness on feet (R26.81);Difficulty in walking, not elsewhere classified (R26.2);Pain Pain - part of body: Leg     Time: 0102-7253 PT Time Calculation (min) (ACUTE ONLY): 24 min  Charges:  $Therapeutic Activity: 23-37 mins                    Mabeline Caras, PT, DPT Acute Rehabilitation Services  Pager [phone] Office [place] 11/25/2018, 4:19 PM

## 2018-11-26 LAB — BPAM RBC
Blood Product Expiration Date: 202002242359
Blood Product Expiration Date: 202003032359
ISSUE DATE / TIME: 202002032234
UNIT TYPE AND RH: 5100
Unit Type and Rh: 5100

## 2018-11-26 LAB — TYPE AND SCREEN
ABO/RH(D): O POS
Antibody Screen: NEGATIVE
DONOR AG TYPE: NEGATIVE
Unit division: 0
Unit division: 0

## 2018-11-26 LAB — CBC
HCT: 25.9 % — ABNORMAL LOW (ref 36.0–46.0)
Hemoglobin: 8 g/dL — ABNORMAL LOW (ref 12.0–15.0)
MCH: 29.3 pg (ref 26.0–34.0)
MCHC: 30.9 g/dL (ref 30.0–36.0)
MCV: 94.9 fL (ref 80.0–100.0)
Platelets: 167 10*3/uL (ref 150–400)
RBC: 2.73 MIL/uL — ABNORMAL LOW (ref 3.87–5.11)
RDW: 19 % — ABNORMAL HIGH (ref 11.5–15.5)
WBC: 7.6 10*3/uL (ref 4.0–10.5)
nRBC: 0.3 % — ABNORMAL HIGH (ref 0.0–0.2)

## 2018-11-26 LAB — HEPARIN LEVEL (UNFRACTIONATED): Heparin Unfractionated: 0.28 IU/mL — ABNORMAL LOW (ref 0.30–0.70)

## 2018-11-26 MED ORDER — ENSURE ENLIVE PO LIQD
237.0000 mL | Freq: Three times a day (TID) | ORAL | Status: DC
  Administered 2018-11-26 – 2018-11-29 (×5): 237 mL via ORAL

## 2018-11-26 MED ORDER — PRO-STAT SUGAR FREE PO LIQD
30.0000 mL | Freq: Two times a day (BID) | ORAL | Status: DC
  Administered 2018-11-26 – 2018-11-28 (×4): 30 mL via ORAL
  Administered 2018-11-28: 10 mL via ORAL
  Administered 2018-11-30 – 2018-12-02 (×2): 30 mL via ORAL
  Filled 2018-11-26 (×13): qty 30

## 2018-11-26 NOTE — Progress Notes (Signed)
RT Note:  Patient sats were low before Neb treatment.  Will attempt to put [place] 2L back on patient to give O2 support, hopefully patient will leave on [place].

## 2018-11-26 NOTE — Care Management Important Message (Signed)
Important Message  Patient Details  Name: SHATHA HOOSER MRN: [id number] Date of Birth: 15-Dec-1945   Medicare Important Message Given:  Yes    Elyzabeth Goatley P Pangburn 11/26/2018, 1:43 PM

## 2018-11-26 NOTE — Progress Notes (Addendum)
Patient's intake has been minimal, she was able to take her medication except for her pro-stat,which only took half of the recommended.  She asked for some coke but only took 2 sips. I will keep monitoring patient.

## 2018-11-26 NOTE — Progress Notes (Addendum)
Nutrition Follow-up / Consult  DOCUMENTATION CODES:   Non-severe (moderate) malnutrition in context of chronic illness  INTERVENTION:    Ensure Enlive po TID, each supplement provides 350 kcal and 20 grams of protein  Pro-stat 30 ml PO BID  NUTRITION DIAGNOSIS:   Moderate Malnutrition related to chronic illness as evidenced by moderate muscle depletion, moderate fat depletion.  Ongoing  GOAL:   Patient will meet greater than or equal to 90% of their needs  Unmet  MONITOR:   PO intake, Supplement acceptance, Diet advancement, TF tolerance  REASON FOR ASSESSMENT:   Consult Assessment of nutrition requirement/status  ASSESSMENT:   73 y/o female PMHx Multiple Myeloma, currently on chemotherapy, ckd3, FTT, Afib, CAD. Had recently been admitted 1/3-1/14 for infectious colitis. Now presents via EMS after suffering 2x cardiac arrest, first w/ ROSC 2 min and second ROSC 10 min. Intubated in field and begun on hypothermia protocol on arrival  TF off; Cortrak tube was removed yesterday in hopes that oral intake would improve. Per discussion with RN, patient is not eating anything. She doesn't even open her mouth when offered food. She has been able to give her Pro-stat PO via a syringe. Will add PO supplements to maximize oral intake.   Labs reviewed. Medications reviewed and include KCl.  Palliative Care team following for overall goals of care. Trialing PO diet without feeding tube to see how she does. Family would like patient to go to rehab.  Diet Order:   Diet Order            DIET - DYS 1 Room service appropriate? Yes; Fluid consistency: Thin  Diet effective now              EDUCATION NEEDS:   No education needs have been identified at this time  Skin:  Skin Assessment: (MASD to buttocks)  Last BM:  2/6 (type 5)  Height:   Ht Readings from Last 1 Encounters:  11/12/18 '5\' 5"'  (1.651 m)    Weight:   Wt Readings from Last 1 Encounters:  11/23/18 71.1 kg     Ideal Body Weight:  56.82 kg  BMI:  Body mass index is 26.08 kg/m.  Estimated Nutritional Needs:   Kcal:  1600-1900  Protein:  85-100 grams  Fluid:  >/= 1.7 L/day    Molli Barrows, RD, LDN, [place] Pager [phone] After Hours Pager [phone]

## 2018-11-26 NOTE — Progress Notes (Signed)
ANTICOAGULATION CONSULT NOTE  Pharmacy Consult:  Heparin Indication:  Acute DVT  Allergies  Allergen Reactions  . Sulfa Antibiotics Rash    Patient Measurements: Height: 5\' 5"  (165.1 cm) Weight: 156 lb 12 oz (71.1 kg) IBW/kg (Calculated) : 57 Heparin Dosing Weight: 72 kg  Vital Signs: Temp: 98.9 F (37.2 C) (02/07 0442) Temp Source: Oral (02/07 0442) BP: 141/60 (02/07 0442) Pulse Rate: 54 (02/07 0442)  Labs: Recent Labs    11/24/18 0500  11/25/18 0347 11/25/18 1131 11/26/18 0355  HGB 8.0*  --  8.4*  --  8.0*  HCT 24.7*  --  25.9*  --  25.9*  PLT 159  --  164  --  167  HEPARINUNFRC 0.29*   < > 0.42 0.41 0.28*  CREATININE 1.34*  --   --   --   --    < > = values in this interval not displayed.    Estimated Creatinine Clearance: 38.1 mL/min (A) (by C-G formula based on SCr of 1.34 mg/dL (H)).   Assessment: 58 YOF with history of Afib not on anticoagulation PTA due to history of GIB.  Patient admitted s/p cardiac arrest, then developed an acute DVT and was started on IV heparin.  She subsequently had a RLE hematoma and heparin was held.  Hematoma improving and Pharmacy consulted to restart IV heparin without boluses.    Heparin level is slightly sub-therapeutic when the previous two levels were perfect.  RN reports that patient is bending her arms and the pump beeps off and on, suggesting that patient didn't receive heparin as intended.  No bleeding reported.   Goal of Therapy:  Heparin level 0.3-0.5 units/ml (lower range d/t hematoma) Monitor platelets by anticoagulation protocol: Yes   Plan:  Increase heparin gtt slightly to 900 units/hr, no bolus with hematoma/anemia Daily heparin level and CBC F/U with transitioning to oral anticoagulation   Eavan Gonterman D. Mina Marble, PharmD, BCPS, [place] 11/26/2018, 7:09 AM

## 2018-11-26 NOTE — Consult Note (Signed)
Ref: Nolene Ebbs, MD   Subjective:  Awake. T max 99.8 degree F. No respiratory distress now.  Off tube feeding but sleeping more. Needs encouragement for feeding. Ensure feedings with straw + nutritional consult awaiting. Very little communication. Hgb at 8 g..    Objective:  Vital Signs in the last 24 hours: Temp:  [98.4 F (36.9 C)-99.8 F (37.7 C)] 99.8 F (37.7 C) (02/07 0735) Pulse Rate:  [53-75] 65 (02/07 0735) Cardiac Rhythm: Normal sinus rhythm (02/07 0844) Resp:  [14-21] 15 (02/07 0735) BP: (97-141)/(56-76) 130/76 (02/07 0735) SpO2:  [87 %-99 %] 93 % (02/07 0735)  Physical Exam: BP Readings from Last 1 Encounters:  11/26/18 130/76     Wt Readings from Last 1 Encounters:  11/23/18 71.1 kg    Weight change:  Body mass index is 26.08 kg/m. HEENT: [place]/AT, Eyes-Brown, PERL, EOMI, Conjunctiva-Pale, Sclera-Non-icteric Neck: No JVD, No bruit, Trachea midline. Lungs:  Clear, Bilateral. Cardiac:  Regular rhythm, normal S1 and S2, no S3. II/VI systolic murmur. Abdomen:  Soft, non-tender. BS present. Extremities:  No edema present. No cyanosis. No clubbing. CNS: AxOx2, Cranial nerves grossly intact, moves all 4 extremities.  Skin: Warm and dry.   Intake/Output from previous day: 02/06 0701 - 02/07 0700 In: 291.3 [P.O.:120; I.V.:171.3] Out: 1050 [Urine:1050]    Lab Results: BMET    Component Value Date/Time   NA 142 11/24/2018 0500   NA 144 11/23/2018 0559   NA 147 (H) 11/22/2018 0327   NA 144 09/17/2017 1429   NA 141 07/30/2017 1504   NA 142 06/11/2017 1451   K 3.7 11/24/2018 0500   K 4.3 11/23/2018 0559   K 5.5 (H) 11/22/2018 0327   K 3.1 (L) 09/17/2017 1429   K 4.8 07/30/2017 1504   K 4.3 06/11/2017 1451   CL 107 11/24/2018 0500   CL 113 (H) 11/23/2018 0559   CL 120 (H) 11/22/2018 0327   CO2 25 11/24/2018 0500   CO2 22 11/23/2018 0559   CO2 19 (L) 11/22/2018 0327   CO2 23 09/17/2017 1429   CO2 20 (L) 07/30/2017 1504   CO2 20 (L) 06/11/2017 1451    GLUCOSE 95 11/24/2018 0500   GLUCOSE 85 11/23/2018 0559   GLUCOSE 119 (H) 11/22/2018 0327   GLUCOSE 96 09/17/2017 1429   GLUCOSE 79 07/30/2017 1504   GLUCOSE 74 06/11/2017 1451   BUN 26 (H) 11/24/2018 0500   BUN 27 (H) 11/23/2018 0559   BUN 21 11/22/2018 0327   BUN 15.6 09/17/2017 1429   BUN 24.4 07/30/2017 1504   BUN 18.7 06/11/2017 1451   CREATININE 1.34 (H) 11/24/2018 0500   CREATININE 1.22 (H) 11/23/2018 0559   CREATININE 1.05 (H) 11/22/2018 0327   CREATININE 1.97 (H) 10/21/2018 1055   CREATININE 1.36 (H) 10/07/2018 0823   CREATININE 1.41 (H) 09/30/2018 1005   CREATININE 1.0 09/17/2017 1429   CREATININE 1.2 (H) 07/30/2017 1504   CREATININE 1.2 (H) 06/11/2017 1451   CALCIUM 7.9 (L) 11/24/2018 0500   CALCIUM 8.1 (L) 11/23/2018 0559   CALCIUM 8.2 (L) 11/22/2018 0327   CALCIUM 8.6 09/17/2017 1429   CALCIUM 9.1 07/30/2017 1504   CALCIUM 9.6 06/11/2017 1451   GFRNONAA 40 (L) 11/24/2018 0500   GFRNONAA 45 (L) 11/23/2018 0559   GFRNONAA 53 (L) 11/22/2018 0327   GFRNONAA 25 (L) 10/21/2018 1055   GFRNONAA 39 (L) 10/07/2018 0823   GFRNONAA 37 (L) 09/30/2018 1005   GFRAA 46 (L) 11/24/2018 0500   GFRAA 52 (L) 11/23/2018  0559   GFRAA >60 11/22/2018 0327   GFRAA 29 (L) 10/21/2018 1055   GFRAA 45 (L) 10/07/2018 0823   GFRAA 43 (L) 09/30/2018 1005   CBC    Component Value Date/Time   WBC 7.6 11/26/2018 0355   RBC 2.73 (L) 11/26/2018 0355   HGB 8.0 (L) 11/26/2018 0355   HGB 9.1 (L) 10/21/2018 1055   HGB 11.3 (L) 09/17/2017 1428   HCT 25.9 (L) 11/26/2018 0355   HCT 35.8 09/17/2017 1428   PLT 167 11/26/2018 0355   PLT 271 10/21/2018 1055   PLT 231 09/17/2017 1428   MCV 94.9 11/26/2018 0355   MCV 93.2 09/17/2017 1428   MCH 29.3 11/26/2018 0355   MCHC 30.9 11/26/2018 0355   RDW 19.0 (H) 11/26/2018 0355   RDW 16.5 (H) 09/17/2017 1428   LYMPHSABS 0.6 (L) 11/14/2018 0400   LYMPHSABS 0.7 (L) 09/17/2017 1428   MONOABS 1.8 (H) 11/14/2018 0400   MONOABS 0.5 09/17/2017 1428    EOSABS 0.0 11/14/2018 0400   EOSABS 0.1 09/17/2017 1428   BASOSABS 0.1 11/14/2018 0400   BASOSABS 0.0 09/17/2017 1428   HEPATIC Function Panel Recent Labs    11/14/18 0400 11/15/18 0931 11/21/18 0430  PROT 5.2* 5.3* 5.0*   HEMOGLOBIN A1C No components found for: HGA1C,  MPG CARDIAC ENZYMES Lab Results  Component Value Date   CKTOTAL 52 08/21/2017   CKMB 1.3 08/21/2017   TROPONINI 0.94 (HH) 11/13/2018   TROPONINI 0.92 (HH) 11/13/2018   TROPONINI 0.80 (HH) 11/12/2018   BNP No results for input(s): PROBNP in the last 8760 hours. TSH Recent Labs    12/13/17 2243 10/02/18 1825  TSH 1.922 0.751   CHOLESTEROL Recent Labs    12/14/17 0457 09/02/18 0152 10/03/18 0637  CHOL 167 143 136    Scheduled Meds: . amiodarone  100 mg Per Tube Daily  . atorvastatin  40 mg Per Tube q1800  . budesonide (PULMICORT) nebulizer solution  0.25 mg Nebulization BID  . chlorhexidine  15 mL Mouth Rinse BID  . Chlorhexidine Gluconate Cloth  6 each Topical Daily  . chlorpheniramine-HYDROcodone  5 mL Oral Q12H  . feeding supplement (PRO-STAT SUGAR FREE 64)  30 mL Per Tube Daily  . fluconazole  100 mg Oral Daily  . furosemide  40 mg Per Tube Daily  . ipratropium-albuterol  3 mL Nebulization BID  . mouth rinse  15 mL Mouth Rinse q12n4p  . metoprolol tartrate  12.5 mg Per Tube BID  . potassium chloride  10 mEq Oral Q M,W,F  . pyridostigmine  30 mg Per Tube BID  . sodium chloride flush  10-40 mL Intracatheter Q12H   Continuous Infusions: . feeding supplement (OSMOLITE 1.2 CAL) Stopped (11/25/18 1100)  . heparin 900 Units/hr (11/26/18 0836)   PRN Meds:.morphine injection, oxyCODONE, sodium chloride flush, traMADol  Assessment/Plan: V. 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 Continue low dose amiodarone.   LOS: 14 days    [name]  MD  11/26/2018, 10:46 AM

## 2018-11-26 NOTE — Progress Notes (Signed)
PROGRESS NOTE    Abigail [name]  MRN:[id number] DOB: 10/05/1946 DOA: 11/12/2018 PCP: Avbuere, Edwin, MD   Brief Narrative: Abigail [name] is a 73 y.o. womanwith a hx ofmyasthenia gravis, atrial fibrillation, prolonged QTC, CKD,multiple myeloma, and generalized FTT. Patient presented secondary to VT/VF arrest x2. Defibrillated 3 times. Intubated prior to arrival to hospital.    Assessment & Plan:   Active Problems:   Encounter for central line placement   Cardiac arrest with ventricular fibrillation (HCC)   Acute respiratory failure with hypoxia (HCC)   AKI (acute kidney injury) (HCC)   Advanced care planning/counseling discussion   Goals of care, counseling/discussion   Palliative care by specialist   Thrush   Closed fracture of multiple ribs   Pneumonia   V-tach/V-fib arrest Evaluated by EP and not a candidate for an AICD.  Tachy-brady syndrome EP consulted. Initially on amiodarone which was discontinued secondary to hypotension. Has been stable on amiodarone PO. -Cardiology recommendations: Decreasing amiodarone today secondary to borderline prolonged QTc  Acute respiratory failure with hypoxia Resolved. Procalcitonin in range not recommending initiation of antibiotics. Likely atelectasis related -Encourage incentive spirometer use  Encephalopathy Anoxic vs metabolic. Appears to be improving so possibly metabolic.   Moderate malnutrition NG tube removed 11/25/2018. Poor motivation to eat.  -Encourage oral intake; continue dysphagia diet and advance as patient tolerates -Nutrition consult pending  Multiple myeloma Diffuse lytic bone lesions. Last received Zometa in December. Chemo on hold  Myasthenia gravis -Continue pyridostigmine  Right lower leg hematoma  Acute RLE DVT Currently on heparin drip. Hemoglobin stable. -Will transition to oral prior to discharge  Macrocytic anemia Acute on chronic. Currently stable. On heparin.  Acute kidney injury on CKD  III Baseline creatinine of 1.4. Peak creatinine of 1.86. improved. -Repeat BMP, may need IV fluids if not keeping up orally  CAD Cardiology on board. S/p PTCA to LAD/LCx in past. No cath performed this admission.   Generalized pain -Continue Oxycodone, tramadol, morphine prn   DVT prophylaxis: SCDs Code Status:   Code Status: DNR Family Communication: None at bedside Disposition Plan: Discharge likely to SNF when medically more stable (eating by mouth, pain controlled, goals of care discussions)   Consultants:   Cardiology  Palliative care  Procedures:   None  Antimicrobials:  Cefazolin    Subjective: No concerns today per patient.  Objective: Vitals:   11/26/18 0442 11/26/18 0734 11/26/18 0735 11/26/18 1140  BP: (!) 141/60  130/76 105/82  Pulse: (!) 54 65 65 (!) 48  Resp: 16 14 15 20  Temp: 98.9 F (37.2 C)  99.8 F (37.7 C) 99.5 F (37.5 C)  TempSrc: Oral  Oral Oral  SpO2: 91% 93% 93% (!) 87%  Weight:      Height:        Intake/Output Summary (Last 24 hours) at 11/26/2018 1213 Last data filed at 11/26/2018 0311 Gross per 24 hour  Intake 231.29 ml  Output 300 ml  Net -68.71 ml   Filed Weights   11/21/18 0358 11/22/18 0341 11/23/18 0500  Weight: 74.3 kg 75.4 kg 71.1 kg    Examination:  General exam: Appears calm and comfortable Respiratory system: Coarse breath sounds with some rhonchi. Respiratory effort normal. Cardiovascular system: S1 & S2 heard, RRR. No murmurs, rubs, gallops or clicks. Gastrointestinal system: Abdomen is nondistended, soft and nontender. No organomegaly or masses felt. Normal bowel sounds heard. Central nervous system: Alert and oriented. No focal neurological deficits. Extremities: No edema. No calf tenderness Skin: No   cyanosis. No rashes Psychiatry: Judgement and insight appear normal. Flat affect. Depressed appearing mood.    Data Reviewed: I have personally reviewed following labs and imaging studies  CBC: Recent  Labs  Lab 11/22/18 0327 11/23/18 0559 11/24/18 0500 11/25/18 0347 11/26/18 0355  WBC 15.7* 11.5* 11.1* 10.3 7.6  HGB 6.9* 8.2* 8.0* 8.4* 8.0*  HCT 24.3* 26.4* 24.7* 25.9* 25.9*  MCV 102.5* 94.3 92.5 92.2 94.9  PLT 169 148* 159 164 889   Basic Metabolic Panel: Recent Labs  Lab 11/20/18 0530 11/21/18 0430 11/22/18 0327 11/23/18 0559 11/24/18 0500  NA 149* 149* 147* 144 142  K 3.3* 4.9 5.5* 4.3 3.7  CL 125* 121* 120* 113* 107  CO2 17* 19* 19* 22 25  GLUCOSE 120* 116* 119* 85 95  BUN _0 27* 26*  CREATININE 1.28* 1.17* 1.05* 1.22* 1.34*  CALCIUM 7.4* 8.1* 8.2* 8.1* 7.9*  PHOS  --   --   --   --  3.6   GFR: Estimated Creatinine Clearance: 38.1 mL/min (A) (by C-G formula based on SCr of 1.34 mg/dL (H)). Liver Function Tests: Recent Labs  Lab 11/21/18 0430 11/24/18 0500  AST 17  --   ALT 11  --   ALKPHOS 78  --   BILITOT 0.3  --   PROT 5.0*  --   ALBUMIN 2.2* 2.0*   No results for input(s): LIPASE, AMYLASE in the last 168 hours. No results for input(s): AMMONIA in the last 168 hours. Coagulation Profile: No results for input(s): INR, PROTIME in the last 168 hours. Cardiac Enzymes: No results for input(s): CKTOTAL, CKMB, CKMBINDEX, TROPONINI in the last 168 hours. BNP (last 3 results) No results for input(s): PROBNP in the last 8760 hours. HbA1C: No results for input(s): HGBA1C in the last 72 hours. CBG: No results for input(s): GLUCAP in the last 168 hours. Lipid Profile: No results for input(s): CHOL, HDL, LDLCALC, TRIG, CHOLHDL, LDLDIRECT in the last 72 hours. Thyroid Function Tests: No results for input(s): TSH, T4TOTAL, FREET4, T3FREE, THYROIDAB in the last 72 hours. Anemia Panel: No results for input(s): VITAMINB12, FOLATE, FERRITIN, TIBC, IRON, RETICCTPCT in the last 72 hours. Sepsis Labs: Recent Labs  Lab 11/25/18 1131  PROCALCITON 0.20    No results found for this or any previous visit (from the past 240 hour(s)).       Radiology  Studies: No results found.      Scheduled Meds: . amiodarone  100 mg Per Tube Daily  . atorvastatin  40 mg Per Tube q1800  . budesonide (PULMICORT) nebulizer solution  0.25 mg Nebulization BID  . chlorhexidine  15 mL Mouth Rinse BID  . Chlorhexidine Gluconate Cloth  6 each Topical Daily  . chlorpheniramine-HYDROcodone  5 mL Oral Q12H  . feeding supplement (PRO-STAT SUGAR FREE 64)  30 mL Per Tube Daily  . fluconazole  100 mg Oral Daily  . furosemide  40 mg Per Tube Daily  . ipratropium-albuterol  3 mL Nebulization BID  . mouth rinse  15 mL Mouth Rinse q12n4p  . metoprolol tartrate  12.5 mg Per Tube BID  . potassium chloride  10 mEq Oral Q M,W,F  . pyridostigmine  30 mg Per Tube BID  . sodium chloride flush  10-40 mL Intracatheter Q12H   Continuous Infusions: . feeding supplement (OSMOLITE 1.2 CAL) Stopped (11/25/18 1100)  . heparin 900 Units/hr (11/26/18 0836)     LOS: 14 days     Cordelia Poche, MD [facility] 11/26/2018, 12:13  PM  If 7PM-7AM, please contact night-coverage [email or web address]

## 2018-11-26 NOTE — Progress Notes (Signed)
Daily Progress Note   Patient Name: Abigail [name]       Date: 11/26/2018 DOB: 01/18/1946  Age: 73 y.o. MRN#: [id number] Attending Physician: Mariel Aloe, MD Primary Care Physician: Nolene Ebbs, MD Admit Date: 11/12/2018  Reason for Consultation/Follow-up: Establishing goals of care  Subjective: Patient lethargic, wakes up with one word responses and quickly goes back to sleep  Length of Stay: 14  Current Medications: Scheduled Meds:  . amiodarone  100 mg Per Tube Daily  . atorvastatin  40 mg Per Tube q1800  . budesonide (PULMICORT) nebulizer solution  0.25 mg Nebulization BID  . chlorhexidine  15 mL Mouth Rinse BID  . Chlorhexidine Gluconate Cloth  6 each Topical Daily  . feeding supplement (ENSURE ENLIVE)  237 mL Oral TID BM  . feeding supplement (PRO-STAT SUGAR FREE 64)  30 mL Oral BID  . fluconazole  100 mg Oral Daily  . ipratropium-albuterol  3 mL Nebulization BID  . mouth rinse  15 mL Mouth Rinse q12n4p  . potassium chloride  10 mEq Oral Q M,W,F  . pyridostigmine  30 mg Per Tube BID  . sodium chloride flush  10-40 mL Intracatheter Q12H    Continuous Infusions: . heparin 900 Units/hr (11/26/18 0836)    PRN Meds: oxyCODONE, sodium chloride flush, traMADol  Physical Exam Constitutional:      General: She is sleeping. She is not in acute distress. Cardiovascular:     Rate and Rhythm: Bradycardia present.  Pulmonary:     Effort: Pulmonary effort is normal.  Abdominal:     Palpations: Abdomen is soft.  Skin:    General: Skin is warm and dry.  Neurological:     Mental Status: She is lethargic.             Vital Signs: BP (!) 128/53   Pulse (!) 48   Temp 99.5 F (37.5 C) (Oral)   Resp 17   Ht _0  (1.651 m)   Wt 71.1 kg   SpO2 (!) 87%   BMI 26.08 kg/m  SpO2: SpO2:  (!) 87 % O2 Device: O2 Device: Room Air O2 Flow Rate: O2 Flow Rate (L/min): 3 L/min  Intake/output summary:   Intake/Output Summary (Last 24 hours) at 11/26/2018 1744 Last data filed at 11/26/2018 6812 Gross per 24 hour  Intake 591.29 ml  Output 400 ml  Net 191.29 ml   LBM: Last BM Date: 11/25/18 Baseline Weight: Weight: 74 kg Most recent weight: Weight: 71.1 kg       Palliative Assessment/Data: PPS 20%    Flowsheet Rows     Most Recent Value  Intake Tab  Referral Department  Hospitalist  Unit at Time of Referral  ICU  Palliative Care Primary Diagnosis  Cardiac  Date Notified  11/17/18  Palliative Care Type  New Palliative care  Reason for referral  Clarify Goals of Care  Date of Admission  11/12/18  Date first seen by Palliative Care  11/17/18  # of days Palliative referral response time  0 Day(s)  # of days IP prior to Palliative referral  5  Clinical Assessment  Psychosocial & Spiritual Assessment  Palliative Care Outcomes      Patient  Active Problem List   Diagnosis Date Noted  . Thrush   . Closed fracture of multiple ribs   . Pneumonia   . Acute respiratory failure with hypoxia ([place])   . AKI (acute kidney injury) ([place])   . Advanced care planning/counseling discussion   . Goals of care, counseling/discussion   . Palliative care by specialist   . Cardiac arrest with ventricular fibrillation ([place]) 11/12/2018  . Hypotension   . Septic shock ([place]) 10/23/2018  . Acute kidney injury superimposed on chronic kidney disease ([place]) 10/22/2018  . Colitis 10/22/2018  . Bradycardia 10/22/2018  . AF (paroxysmal atrial fibrillation) ([place]) 10/22/2018  . SVT (supraventricular tachycardia) ([place]) 10/08/2018  . Atrial fibrillation with RVR ([place]) 1Nov 29, 202019  . Precordial chest pain 09/15/2018  . Multiple myeloma in relapse ([place]) 06/15/2018  . Encounter for central line placement 06/15/2018  . Hematochezia   . Acute blood loss anemia   . Diverticulosis of colon with  hemorrhage   . GIB (gastrointestinal bleeding) 09/03/2017  . Symptomatic anemia 09/03/2017  . Acute systolic heart failure ([place]) 09/03/2017  . Chronic kidney disease (CKD), stage III (moderate) ([place]) 09/03/2017  . Hyperlipidemia 09/03/2017  . Multiple myeloma in remission ([place]) 09/03/2017  . GI bleed 08/17/2017  . Sepsis ([place]) 08/17/2017  . Fever 08/16/2017  . STEMI (ST elevation myocardial infarction) ([place]) 02/24/2017  . Acute anterolateral wall MI ([place]) 02/24/2017  . Dehydration 01/21/2017  . Acute coronary syndrome ([place]) 12/24/2016  . Dizziness 12/22/2016    Class: Acute  . Orthostatic hypotension 12/08/2016  . Syncope and collapse 12/06/2016  . Tachycardia-bradycardia ([place])   . Essential hypertension   . Nausea and vomiting in adult patient   . Erosive esophagitis   . Protein-calorie malnutrition, severe 11/19/2016  . LLQ pain   . LUQ abdominal pain   . Intractable nausea and vomiting 11/17/2016  . Abdominal pain 11/05/2016  . Multiple myeloma ([place]) 06/18/2016  . Malnutrition of moderate degree 06/14/2016  . 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 Exertional dyspnea 11/01/2015  . Coronary atherosclerosis of native coronary artery 11/01/2015  . Shortness of breath 11/01/2015  . Chest pain 12/31/2011  . Coronary artery disease 10/20/1997    Palliative Care Assessment & Plan   HPI: 73 y.o.femalewith past medical history of myasthenia gravis, a. Fib w/ RVR, prolonged QT, CKD III, multiple myeloma with diffuse lytic lesions- (chemo recently held due to poor performance status, weight loss), recent hospitalization d/t septic shock from infectious colitis (discharged home on 1/14- of note during that hospitalization patient stated she did not want aggressive or artificial life support measures per 1/3 note from Dr. Nicole Cella on 1/24/2020with cardiac arrest x2 s/p CPR x 1 for 2 minutes and then x 10 minutes with intubation.She was noted to  be posturing and had rightsided gaze preference in the ED. She is now extubated, however, mental status is slow to improve. ECHO showed 20-25% EF with diffuse hypokinesis. Palliative medicine consulted for [place].  Assessment: Follow up today with patient - she denies pain. Morphine has been discontinued and patient is receiving PRN oxycodone. Feeding tube has been removed and patient has been placed on dysphagia diet; however, patient has poor appetite. Has has a few bites/sips. Family hopeful intake will improve and hopeful patient can go to rehab facility. We discussed watchful waiting for intake and encouraging nutrition. If she does not do well with diet, may need to revisit conversation about comfort focused care/hospice.  Recommendations/Plan:  Encourage PO  intake  Family hopeful for improvement in intake and transition to rehab facility  Continue oxycodone as needed; continue diflucan  PMT to shadow for improvement or decline  If patient goes to rehab, recommend outpatient palliative to follow  Code Status:  DNR  Prognosis:   Unable to determine  Discharge Planning:  To Be Determined    Care plan was discussed with sister, [name] and RN   Thank you for allowing the Palliative Medicine Team to assist in the care of this patient.   Total Time 25 minutes Prolonged Time Billed  no       Greater than 50%  of this time was spent counseling and coordinating care related to the above assessment and plan.  Juel Burrow, DNP, [facility] Palliative Medicine Team Team Phone # [phone]  Pager [phone]

## 2018-11-26 NOTE — Progress Notes (Signed)
Pt HR in low 40's. Pt asymptomatic. Spoke with Dr Doylene Canard. He is aware and stated he is coming to assess the patient. Will continue to monitor.

## 2018-11-27 LAB — CBC
HCT: 25.5 % — ABNORMAL LOW (ref 36.0–46.0)
Hemoglobin: 7.7 g/dL — ABNORMAL LOW (ref 12.0–15.0)
MCH: 29.1 pg (ref 26.0–34.0)
MCHC: 30.2 g/dL (ref 30.0–36.0)
MCV: 96.2 fL (ref 80.0–100.0)
Platelets: 180 10*3/uL (ref 150–400)
RBC: 2.65 MIL/uL — ABNORMAL LOW (ref 3.87–5.11)
RDW: 18.8 % — ABNORMAL HIGH (ref 11.5–15.5)
WBC: 5.9 10*3/uL (ref 4.0–10.5)
nRBC: 0 % (ref 0.0–0.2)

## 2018-11-27 LAB — BASIC METABOLIC PANEL
ANION GAP: 12 (ref 5–15)
BUN: 17 mg/dL (ref 8–23)
CO2: 27 mmol/L (ref 22–32)
Calcium: 7.8 mg/dL — ABNORMAL LOW (ref 8.9–10.3)
Chloride: 108 mmol/L (ref 98–111)
Creatinine, Ser: 1.35 mg/dL — ABNORMAL HIGH (ref 0.44–1.00)
GFR, EST AFRICAN AMERICAN: 46 mL/min — AB (ref >60)
GFR, EST NON AFRICAN AMERICAN: 39 mL/min — AB (ref >60)
Glucose, Bld: 81 mg/dL (ref 70–99)
Potassium: 3.5 mmol/L (ref 3.5–5.1)
Sodium: 147 mmol/L — ABNORMAL HIGH (ref 135–145)

## 2018-11-27 LAB — HEPARIN LEVEL (UNFRACTIONATED)
Heparin Unfractionated: 0.63 IU/mL (ref 0.30–0.70)
Heparin Unfractionated: 0.71 IU/mL — ABNORMAL HIGH (ref 0.30–0.70)
Heparin Unfractionated: 0.45 IU/mL (ref 0.30–0.70)

## 2018-11-27 MED ORDER — DEXTROSE-NACL 5-0.45 % IV SOLN
INTRAVENOUS | Status: DC
  Administered 2018-11-27: 15:00:00 via INTRAVENOUS

## 2018-11-27 NOTE — Progress Notes (Signed)
ANTICOAGULATION CONSULT NOTE - Follow Up Consult  Pharmacy Consult for heparin Indication: DVT  Labs: Recent Labs    11/25/18 0347  11/26/18 0355 11/27/18 0436 11/27/18 0437 11/27/18 1315 11/27/18 2236  HGB 8.4*  --  8.0* 7.7*  --   --   --   HCT 25.9*  --  25.9* 25.5*  --   --   --   PLT 164  --  167 180  --   --   --   HEPARINUNFRC 0.42   < > 0.28*  --  0.63 0.71* 0.45  CREATININE  --   --   --  1.35*  --   --   --    < > = values in this interval not displayed.    Assessment/Plan:  73yo female therapeutic on heparin after rate change. Will continue gtt at current rate and confirm stable with am labs.   Wynona Neat, PharmD, BCPS  11/27/2018,11:50 PM

## 2018-11-27 NOTE — Consult Note (Signed)
Ref: Nolene Ebbs, MD   Subjective:  Awake. Eating very little per staff. Moniotr sinus rhythm with rate in 50's. Hemoglobin at 7.7 g. and potassium at 3.5 mmol are trending downward.  Objective:  Vital Signs in the last 24 hours: Temp:  [98 F (36.7 C)-99.1 F (37.3 C)] 98 F (36.7 C) (02/08 1221) Pulse Rate:  [47-57] 49 (02/08 1221) Cardiac Rhythm: Normal sinus rhythm (02/08 0940) Resp:  [11-23] 12 (02/08 1221) BP: (107-142)/(57-72) 132/66 (02/08 1221) SpO2:  [78 %-100 %] 100 % (02/08 1221) FiO2 (%):  [28 %] 28 % (02/08 0736) Weight:  [71.1 kg] 71.1 kg (02/08 0409)  Physical Exam: BP Readings from Last 1 Encounters:  11/27/18 132/66     Wt Readings from Last 1 Encounters:  11/27/18 71.1 kg    Weight change:  Body mass index is 26.08 kg/m. HEENT: [place]/AT, Eyes-Brown, PERL, EOMI, Conjunctiva-Pale, Sclera-Non-icteric Neck: No JVD, No bruit, Trachea midline. Lungs:  Clear, Bilateral. Cardiac:  Regular rhythm, normal S1 and S2, no S3. II/VI systolic murmur. Abdomen:  Soft, non-tender. BS present. Extremities:  No edema present. No cyanosis. No clubbing. Right below the knee hematoma is smaller but tender CNS: AxOx2, Cranial nerves grossly intact, moves all 4 extremities.  Skin: Warm and dry.   Intake/Output from previous day: 02/07 0701 - 02/08 0700 In: 850.9 [P.O.:626; I.V.:224.9] Out: 700 [Urine:700]    Lab Results: BMET    Component Value Date/Time   NA 147 (H) 11/27/2018 0436   NA 142 11/24/2018 0500   NA 144 11/23/2018 0559   NA 144 09/17/2017 1429   NA 141 07/30/2017 1504   NA 142 06/11/2017 1451   K 3.5 11/27/2018 0436   K 3.7 11/24/2018 0500   K 4.3 11/23/2018 0559   K 3.1 (L) 09/17/2017 1429   K 4.8 07/30/2017 1504   K 4.3 06/11/2017 1451   CL 108 11/27/2018 0436   CL 107 11/24/2018 0500   CL 113 (H) 11/23/2018 0559   CO2 27 11/27/2018 0436   CO2 25 11/24/2018 0500   CO2 22 11/23/2018 0559   CO2 23 09/17/2017 1429   CO2 20 (L) 07/30/2017 1504    CO2 20 (L) 06/11/2017 1451   GLUCOSE 81 11/27/2018 0436   GLUCOSE 95 11/24/2018 0500   GLUCOSE 85 11/23/2018 0559   GLUCOSE 96 09/17/2017 1429   GLUCOSE 79 07/30/2017 1504   GLUCOSE 74 06/11/2017 1451   BUN 17 11/27/2018 0436   BUN 26 (H) 11/24/2018 0500   BUN 27 (H) 11/23/2018 0559   BUN 15.6 09/17/2017 1429   BUN 24.4 07/30/2017 1504   BUN 18.7 06/11/2017 1451   CREATININE 1.35 (H) 11/27/2018 0436   CREATININE 1.34 (H) 11/24/2018 0500   CREATININE 1.22 (H) 11/23/2018 0559   CREATININE 1.97 (H) 10/21/2018 1055   CREATININE 1.36 (H) 10/07/2018 0823   CREATININE 1.41 (H) 09/30/2018 1005   CREATININE 1.0 09/17/2017 1429   CREATININE 1.2 (H) 07/30/2017 1504   CREATININE 1.2 (H) 06/11/2017 1451   CALCIUM 7.8 (L) 11/27/2018 0436   CALCIUM 7.9 (L) 11/24/2018 0500   CALCIUM 8.1 (L) 11/23/2018 0559   CALCIUM 8.6 09/17/2017 1429   CALCIUM 9.1 07/30/2017 1504   CALCIUM 9.6 06/11/2017 1451   GFRNONAA 39 (L) 11/27/2018 0436   GFRNONAA 40 (L) 11/24/2018 0500   GFRNONAA 45 (L) 11/23/2018 0559   GFRNONAA 25 (L) 10/21/2018 1055   GFRNONAA 39 (L) 10/07/2018 0823   GFRNONAA 37 (L) 09/30/2018 1005   GFRAA 46 (  L) 11/27/2018 0436   GFRAA 46 (L) 11/24/2018 0500   GFRAA 52 (L) 11/23/2018 0559   GFRAA 29 (L) 10/21/2018 1055   GFRAA 45 (L) 10/07/2018 0823   GFRAA 43 (L) 09/30/2018 1005   CBC    Component Value Date/Time   WBC 5.9 11/27/2018 0436   RBC 2.65 (L) 11/27/2018 0436   HGB 7.7 (L) 11/27/2018 0436   HGB 9.1 (L) 10/21/2018 1055   HGB 11.3 (L) 09/17/2017 1428   HCT 25.5 (L) 11/27/2018 0436   HCT 35.8 09/17/2017 1428   PLT 180 11/27/2018 0436   PLT 271 10/21/2018 1055   PLT 231 09/17/2017 1428   MCV 96.2 11/27/2018 0436   MCV 93.2 09/17/2017 1428   MCH 29.1 11/27/2018 0436   MCHC 30.2 11/27/2018 0436   RDW 18.8 (H) 11/27/2018 0436   RDW 16.5 (H) 09/17/2017 1428   LYMPHSABS 0.6 (L) 11/14/2018 0400   LYMPHSABS 0.7 (L) 09/17/2017 1428   MONOABS 1.8 (H) 11/14/2018 0400    MONOABS 0.5 09/17/2017 1428   EOSABS 0.0 11/14/2018 0400   EOSABS 0.1 09/17/2017 1428   BASOSABS 0.1 11/14/2018 0400   BASOSABS 0.0 09/17/2017 1428   HEPATIC Function Panel Recent Labs    11/14/18 0400 11/15/18 0931 11/21/18 0430  PROT 5.2* 5.3* 5.0*   HEMOGLOBIN A1C No components found for: HGA1C,  MPG CARDIAC ENZYMES Lab Results  Component Value Date   CKTOTAL 52 08/21/2017   CKMB 1.3 08/21/2017   TROPONINI 0.94 (HH) 11/13/2018   TROPONINI 0.92 (HH) 11/13/2018   TROPONINI 0.80 (HH) 11/12/2018   BNP No results for input(s): PROBNP in the last 8760 hours. TSH Recent Labs    12/13/17 2243 10/02/18 1825  TSH 1.922 0.751   CHOLESTEROL Recent Labs    12/14/17 0457 09/02/18 0152 10/03/18 0637  CHOL 167 143 136    Scheduled Meds: . amiodarone  100 mg Per Tube Daily  . atorvastatin  40 mg Per Tube q1800  . budesonide (PULMICORT) nebulizer solution  0.25 mg Nebulization BID  . chlorhexidine  15 mL Mouth Rinse BID  . Chlorhexidine Gluconate Cloth  6 each Topical Daily  . feeding supplement (ENSURE ENLIVE)  237 mL Oral TID BM  . feeding supplement (PRO-STAT SUGAR FREE 64)  30 mL Oral BID  . fluconazole  100 mg Oral Daily  . ipratropium-albuterol  3 mL Nebulization BID  . mouth rinse  15 mL Mouth Rinse q12n4p  . potassium chloride  10 mEq Oral Q M,W,F  . pyridostigmine  30 mg Per Tube BID  . sodium chloride flush  10-40 mL Intracatheter Q12H   Continuous Infusions: . heparin 850 Units/hr (11/27/18 0944)   PRN Meds:.oxyCODONE, sodium chloride flush, traMADol  Assessment/Plan: V. Fib cardiac arrest Acute on chronic left systolic heart failure Acute respiratory failure, resolved Possible pneumonia Possible PE Acute right leg DVT Anemia of chronic disease Anemia of blood loss and iron deficiency Acute renal failure CAD S/P stents in LAD and LCx Hypoxic encephalopathy Multiple myeloma with skeletal abnormalities Sinus bradycardia, stable  Resume low dose  B-blocker if heart rate over 60 bpm. May need D5 1/2 NS till improves oral intake. Off lasix and metoprolol Continue encouragement for feeding.   LOS: 15 days    [name]  MD  11/27/2018, 1:00 PM

## 2018-11-27 NOTE — Progress Notes (Signed)
PROGRESS NOTE    Abigail [name]  ZYS:[id number] DOB: [birth year] DOA: 11/12/2018 PCP: Nolene Ebbs, MD   Brief Narrative: Abigail [name] is a 73 y.o. womanwith a hx ofmyasthenia gravis, atrial fibrillation, prolonged QTC, CKD,multiple myeloma, and generalized FTT. Patient presented secondary to VT/VF arrest x2. Defibrillated 3 times. Intubated prior to arrival to hospital. NG tube out and working on nutrition/physical therapy.    Assessment & Plan:   Active Problems:   Encounter for central line placement   Cardiac arrest with ventricular fibrillation (HCC)   Acute respiratory failure with hypoxia (HCC)   AKI (acute kidney injury) ([place])   Advanced care planning/counseling discussion   Goals of care, counseling/discussion   Palliative care by specialist   Thrush   Closed fracture of multiple ribs   Pneumonia   V-tach/V-fib arrest Evaluated by EP and not a candidate for an AICD.  Tachy-brady syndrome EP consulted. Initially on amiodarone which was discontinued secondary to hypotension. Has been stable on amiodarone PO. -Cardiology recommendations: amiodarone  Acute respiratory failure with hypoxia Resolved. Procalcitonin in range not recommending initiation of antibiotics. Likely atelectasis related -Encourage incentive spirometer use  Encephalopathy Anoxic vs metabolic. Appears to be improving so possibly metabolic. There may be some anoxic brain injury as well vs depression.  Moderate malnutrition NG tube removed 11/25/2018. Poor motivation to eat.  -Encourage oral intake; continue dysphagia diet and advance as patient tolerates -Nutrition recommendations: Ensure TID, pro-stat BID  Multiple myeloma Diffuse lytic bone lesions. Last received Zometa in December. Chemo on hold  Myasthenia gravis -Continue pyridostigmine  Right lower leg hematoma Appears resolved.  Acute RLE DVT Currently on heparin drip. Hemoglobin stable. -Will transition to oral prior to  discharge  Macrocytic anemia Acute on chronic. Currently stable. On heparin.  Acute kidney injury on CKD III Baseline creatinine of 1.4. Peak creatinine of 1.86. improved. Currently at baseline. -AM BMP  Hypernatremia Likely secondary to poor oral hydration. -Encourage oral intake. If continues to rise, will start D5 fluids  CAD Cardiology on board. S/p PTCA to LAD/LCx in past. No cath performed this admission.   Generalized pain -Continue Oxycodone, tramadol, morphine prn   DVT prophylaxis: SCDs Code Status:   Code Status: DNR Family Communication: None at bedside Disposition Plan: Discharge likely to SNF when medically more stable (eating by mouth, keep well hydrated with IV intervention)   Consultants:   Cardiology  Palliative care  Procedures:   None  Antimicrobials:  Cefazolin    Subjective: Some right sided pain. Otherwise, no issues.  Objective: Vitals:   11/27/18 0409 11/27/18 0733 11/27/18 0736 11/27/18 0805  BP: 130/64   (!) 142/72  Pulse: (!) 47   (!) 57  Resp: 11   16  Temp: 98.9 F (37.2 C)   98.5 F (36.9 C)  TempSrc: Oral   Oral  SpO2: 100% 98% 100% 97%  Weight: 71.1 kg     Height:        Intake/Output Summary (Last 24 hours) at 11/27/2018 1017 Last data filed at 11/27/2018 0746 Gross per 24 hour  Intake 490.88 ml  Output 650 ml  Net -159.12 ml   Filed Weights   11/22/18 0341 11/23/18 0500 11/27/18 0409  Weight: 75.4 kg 71.1 kg 71.1 kg    Examination:  General exam: Appears calm and comfortable Respiratory system: Clear to auscultation but diminished. Respiratory effort normal. Cardiovascular system: S1 & S2 heard, RRR. No murmurs, rubs, gallops or clicks. Gastrointestinal system: Abdomen is nondistended, soft and nontender.  No organomegaly or masses felt. Normal bowel sounds heard. Central nervous system: Alert and oriented. No focal neurological deficits. Extremities: No edema. No calf tenderness Skin: No cyanosis. No  rashes Psychiatry: Judgement and insight appear normal. Depressed mood with flat affect    Data Reviewed: I have personally reviewed following labs and imaging studies  CBC: Recent Labs  Lab 11/23/18 0559 11/24/18 0500 11/25/18 0347 11/26/18 0355 11/27/18 0436  WBC 11.5* 11.1* 10.3 7.6 5.9  HGB 8.2* 8.0* 8.4* 8.0* 7.7*  HCT 26.4* 24.7* 25.9* 25.9* 25.5*  MCV 94.3 92.5 92.2 94.9 96.2  PLT 148* 159 164 167 694   Basic Metabolic Panel: Recent Labs  Lab 11/21/18 0430 11/22/18 0327 11/23/18 0559 11/24/18 0500 11/27/18 0436  NA 149* 147* 144 142 147*  K 4.9 5.5* 4.3 3.7 3.5  CL 121* 120* 113* 107 108  CO2 19* 19* '22 25 27  ' GLUCOSE 116* 119* 85 95 81  BUN 20 21 27* 26* 17  CREATININE 1.17* 1.05* 1.22* 1.34* 1.35*  CALCIUM 8.1* 8.2* 8.1* 7.9* 7.8*  PHOS  --   --   --  3.6  --    GFR: Estimated Creatinine Clearance: 37.8 mL/min (A) (by C-G formula based on SCr of 1.35 mg/dL (H)). Liver Function Tests: Recent Labs  Lab 11/21/18 0430 11/24/18 0500  AST 17  --   ALT 11  --   ALKPHOS 78  --   BILITOT 0.3  --   PROT 5.0*  --   ALBUMIN 2.2* 2.0*   No results for input(s): LIPASE, AMYLASE in the last 168 hours. No results for input(s): AMMONIA in the last 168 hours. Coagulation Profile: No results for input(s): INR, PROTIME in the last 168 hours. Cardiac Enzymes: No results for input(s): CKTOTAL, CKMB, CKMBINDEX, TROPONINI in the last 168 hours. BNP (last 3 results) No results for input(s): PROBNP in the last 8760 hours. HbA1C: No results for input(s): HGBA1C in the last 72 hours. CBG: No results for input(s): GLUCAP in the last 168 hours. Lipid Profile: No results for input(s): CHOL, HDL, LDLCALC, TRIG, CHOLHDL, LDLDIRECT in the last 72 hours. Thyroid Function Tests: No results for input(s): TSH, T4TOTAL, FREET4, T3FREE, THYROIDAB in the last 72 hours. Anemia Panel: No results for input(s): VITAMINB12, FOLATE, FERRITIN, TIBC, IRON, RETICCTPCT in the last 72  hours. Sepsis Labs: Recent Labs  Lab 11/25/18 1131  PROCALCITON 0.20    No results found for this or any previous visit (from the past 240 hour(s)).       Radiology Studies: No results found.      Scheduled Meds: . amiodarone  100 mg Per Tube Daily  . atorvastatin  40 mg Per Tube q1800  . budesonide (PULMICORT) nebulizer solution  0.25 mg Nebulization BID  . chlorhexidine  15 mL Mouth Rinse BID  . Chlorhexidine Gluconate Cloth  6 each Topical Daily  . feeding supplement (ENSURE ENLIVE)  237 mL Oral TID BM  . feeding supplement (PRO-STAT SUGAR FREE 64)  30 mL Oral BID  . fluconazole  100 mg Oral Daily  . ipratropium-albuterol  3 mL Nebulization BID  . mouth rinse  15 mL Mouth Rinse q12n4p  . potassium chloride  10 mEq Oral Q M,W,F  . pyridostigmine  30 mg Per Tube BID  . sodium chloride flush  10-40 mL Intracatheter Q12H   Continuous Infusions: . heparin 850 Units/hr (11/27/18 0944)     LOS: 15 days     Cordelia Poche, MD [facility] 11/27/2018,  10:17 AM  If 7PM-7AM, please contact night-coverage [email or web address]

## 2018-11-27 NOTE — Progress Notes (Signed)
ANTICOAGULATION CONSULT NOTE  Pharmacy Consult:  Heparin Indication:  Acute DVT  Allergies  Allergen Reactions  . Sulfa Antibiotics Rash    Patient Measurements: Height: 5\' 5"  (165.1 cm) Weight: 156 lb 12 oz (71.1 kg) IBW/kg (Calculated) : 57 Heparin Dosing Weight: 72 kg  Vital Signs: Temp: 98 F (36.7 C) (02/08 1221) Temp Source: Axillary (02/08 1221) BP: 132/66 (02/08 1221) Pulse Rate: 49 (02/08 1221)  Labs: Recent Labs    11/25/18 0347  11/26/18 0355 11/27/18 0436 11/27/18 0437 11/27/18 1315  HGB 8.4*  --  8.0* 7.7*  --   --   HCT 25.9*  --  25.9* 25.5*  --   --   PLT 164  --  167 180  --   --   HEPARINUNFRC 0.42   < > 0.28*  --  0.63 0.71*  CREATININE  --   --   --  1.35*  --   --    < > = values in this interval not displayed.    Estimated Creatinine Clearance: 37.8 mL/min (A) (by C-G formula based on SCr of 1.35 mg/dL (H)).   Assessment: 51 YOF with history of Afib not on anticoagulation PTA due to history of GIB.  Patient admitted s/p cardiac arrest, then developed an acute DVT and was started on IV heparin.  She subsequently had a RLE hematoma and heparin was held.  Hematoma improving and Pharmacy consulted to restart IV heparin without boluses.   -heparin level up to 0.71 on 850 units   Goal of Therapy:  Heparin level 0.3-0.5 units/ml (lower range d/t hematoma) Monitor platelets by anticoagulation protocol: Yes   Plan:  Decrease heparin gtt to 750 units/hr Heparin level in 8 hours Daily heparin level and CBC F/U with transitioning to oral anticoagulation  Hildred Laser, PharmD Clinical Pharmacist **Pharmacist phone directory can now be found on [email or web address] ([email or web address]).  Listed under [place].

## 2018-11-27 NOTE — Progress Notes (Signed)
ANTICOAGULATION CONSULT NOTE  Pharmacy Consult:  Heparin Indication:  Acute DVT  Allergies  Allergen Reactions  . Sulfa Antibiotics Rash    Patient Measurements: Height: 5\' 5"  (165.1 cm) Weight: 156 lb 12 oz (71.1 kg) IBW/kg (Calculated) : 57 Heparin Dosing Weight: 72 kg  Vital Signs: Temp: 98.9 F (37.2 C) (02/08 0409) Temp Source: Oral (02/08 0409) BP: 130/Abigail (02/08 0409) Pulse Rate: 47 (02/08 0409)  Labs: Recent Labs    11/25/18 0347 11/25/18 1131 11/26/18 0355 11/27/18 0436 11/27/18 0437  HGB 8.4*  --  8.0* 7.7*  --   HCT 25.9*  --  25.9* 25.5*  --   PLT 164  --  167 180  --   HEPARINUNFRC 0.42 0.41 0.28*  --  0.63  CREATININE  --   --   --  1.35*  --     Estimated Creatinine Clearance: 37.8 mL/min (A) (by C-G formula based on SCr of 1.35 mg/dL (H)).   Assessment: Abigail [name] with history of Afib not on anticoagulation PTA due to history of GIB.  Patient admitted s/p cardiac arrest, then developed an acute DVT and was started on IV heparin.  She subsequently had a RLE hematoma and heparin was held.  Hematoma improving and Pharmacy consulted to restart IV heparin without boluses.    Heparin level is slightly supra-therapeutic at 0.63 on 900 units/hr. RN reports no bleeding or problems with infusion. Hgb trending down to 7.7 from 8, plts stable, Scr stable.   Goal of Therapy:  Heparin level 0.3-0.5 units/ml (lower range d/t hematoma) Monitor platelets by anticoagulation protocol: Yes   Plan:  Decrease heparin gtt slightly to 850 units/hr Heparin level in 6 hours Daily heparin level and CBC F/U with transitioning to oral anticoagulation  Juanell Fairly, PharmD PGY1 Pharmacy Resident Phone [phone] 11/27/2018 8:01 AM

## 2018-11-28 ENCOUNTER — Inpatient Hospital Stay (HOSPITAL_COMMUNITY): Payer: Medicare HMO

## 2018-11-28 LAB — CBC
HEMATOCRIT: 26 % — AB (ref 36.0–46.0)
Hemoglobin: 7.8 g/dL — ABNORMAL LOW (ref 12.0–15.0)
MCH: 29.7 pg (ref 26.0–34.0)
MCHC: 30 g/dL (ref 30.0–36.0)
MCV: 98.9 fL (ref 80.0–100.0)
Platelets: 185 10*3/uL (ref 150–400)
RBC: 2.63 MIL/uL — ABNORMAL LOW (ref 3.87–5.11)
RDW: 18.7 % — ABNORMAL HIGH (ref 11.5–15.5)
WBC: 6.4 10*3/uL (ref 4.0–10.5)
nRBC: 0 % (ref 0.0–0.2)

## 2018-11-28 LAB — BASIC METABOLIC PANEL
ANION GAP: 10 (ref 5–15)
BUN: 12 mg/dL (ref 8–23)
CO2: 26 mmol/L (ref 22–32)
Calcium: 7.7 mg/dL — ABNORMAL LOW (ref 8.9–10.3)
Chloride: 108 mmol/L (ref 98–111)
Creatinine, Ser: 1.14 mg/dL — ABNORMAL HIGH (ref 0.44–1.00)
GFR calc Af Amer: 56 mL/min — ABNORMAL LOW (ref >60)
GFR calc non Af Amer: 48 mL/min — ABNORMAL LOW (ref >60)
GLUCOSE: 88 mg/dL (ref 70–99)
Potassium: 3.3 mmol/L — ABNORMAL LOW (ref 3.5–5.1)
Sodium: 144 mmol/L (ref 135–145)

## 2018-11-28 LAB — HEPARIN LEVEL (UNFRACTIONATED): Heparin Unfractionated: 0.38 IU/mL (ref 0.30–0.70)

## 2018-11-28 MED ORDER — KCL IN DEXTROSE-NACL 20-5-0.45 MEQ/L-%-% IV SOLN
INTRAVENOUS | Status: DC
  Administered 2018-11-28: 30 mL via INTRAVENOUS
  Filled 2018-11-28: qty 1000

## 2018-11-28 MED ORDER — POTASSIUM CHLORIDE 20 MEQ/15ML (10%) PO SOLN
20.0000 meq | Freq: Two times a day (BID) | ORAL | Status: DC
  Filled 2018-11-28: qty 15

## 2018-11-28 MED ORDER — POTASSIUM CHLORIDE CRYS ER 10 MEQ PO TBCR
20.0000 meq | EXTENDED_RELEASE_TABLET | Freq: Two times a day (BID) | ORAL | Status: AC
  Administered 2018-11-28 (×2): 20 meq via ORAL
  Filled 2018-11-28 (×2): qty 2

## 2018-11-28 NOTE — Consult Note (Signed)
Ref: Nolene Ebbs, MD   Subjective:  Awake. Poor oral intake continues. VS stable. Not able to ambulate. Potassium 3.3 meq.  Objective:  Vital Signs in the last 24 hours: Temp:  [97.9 F (36.6 C)-98.7 F (37.1 C)] 98.7 F (37.1 C) (02/09 0815) Pulse Rate:  [49-57] 57 (02/09 0815) Cardiac Rhythm: Sinus bradycardia (02/09 0700) Resp:  [12-18] 18 (02/09 0815) BP: (128-157)/(65-77) 157/77 (02/09 0815) SpO2:  [95 %-100 %] 100 % (02/09 0815) Weight:  [68.5 kg] 68.5 kg (02/09 0535)  Physical Exam: BP Readings from Last 1 Encounters:  11/26/2018 (!) 157/77     Wt Readings from Last 1 Encounters:  12/06/2018 68.5 kg    Weight change: -2.6 kg Body mass index is 25.13 kg/m. HEENT: [place]/AT, Eyes-Brown, PERL, EOMI, Conjunctiva-Pale, Sclera-Non-icteric Neck: No JVD, No bruit, Trachea midline. Lungs:  Clear, Bilateral. Cardiac:  Regular rhythm, normal S1 and S2, no S3. II/VI systolic murmur. Abdomen:  Soft, non-tender. BS present. Extremities:  No edema present. No cyanosis. No clubbing. CNS: AxOx2, Cranial nerves grossly intact, moves all 4 extremities.  Skin: Warm and dry.   Intake/Output from previous day: 02/08 0701 - 02/09 0700 In: 790 [P.O.:270; I.V.:520] Out: 800 [Urine:800]    Lab Results: BMET    Component Value Date/Time   NA 144 11/27/2018 0359   NA 147 (H) 11/27/2018 0436   NA 142 11/24/2018 0500   NA 144 09/17/2017 1429   NA 141 07/30/2017 1504   NA 142 06/11/2017 1451   K 3.3 (L) 11/27/2018 0359   K 3.5 11/27/2018 0436   K 3.7 11/24/2018 0500   K 3.1 (L) 09/17/2017 1429   K 4.8 07/30/2017 1504   K 4.3 06/11/2017 1451   CL 108 12/07/2018 0359   CL 108 11/27/2018 0436   CL 107 11/24/2018 0500   CO2 26 12/04/2018 0359   CO2 27 11/27/2018 0436   CO2 25 11/24/2018 0500   CO2 23 09/17/2017 1429   CO2 20 (L) 07/30/2017 1504   CO2 20 (L) 06/11/2017 1451   GLUCOSE 88 12/07/2018 0359   GLUCOSE 81 11/27/2018 0436   GLUCOSE 95 11/24/2018 0500   GLUCOSE 96  09/17/2017 1429   GLUCOSE 79 07/30/2017 1504   GLUCOSE 74 06/11/2017 1451   BUN 12 11/24/2018 0359   BUN 17 11/27/2018 0436   BUN 26 (H) 11/24/2018 0500   BUN 15.6 09/17/2017 1429   BUN 24.4 07/30/2017 1504   BUN 18.7 06/11/2017 1451   CREATININE 1.14 (H) 12/17/2018 0359   CREATININE 1.35 (H) 11/27/2018 0436   CREATININE 1.34 (H) 11/24/2018 0500   CREATININE 1.97 (H) 10/21/2018 1055   CREATININE 1.36 (H) 10/07/2018 0823   CREATININE 1.41 (H) 09/30/2018 1005   CREATININE 1.0 09/17/2017 1429   CREATININE 1.2 (H) 07/30/2017 1504   CREATININE 1.2 (H) 06/11/2017 1451   CALCIUM 7.7 (L) 11/25/2018 0359   CALCIUM 7.8 (L) 11/27/2018 0436   CALCIUM 7.9 (L) 11/24/2018 0500   CALCIUM 8.6 09/17/2017 1429   CALCIUM 9.1 07/30/2017 1504   CALCIUM 9.6 06/11/2017 1451   GFRNONAA 48 (L) 12/01/2018 0359   GFRNONAA 39 (L) 11/27/2018 0436   GFRNONAA 40 (L) 11/24/2018 0500   GFRNONAA 25 (L) 10/21/2018 1055   GFRNONAA 39 (L) 10/07/2018 0823   GFRNONAA 37 (L) 09/30/2018 1005   GFRAA 56 (L) 11/30/2018 0359   GFRAA 46 (L) 11/27/2018 0436   GFRAA 46 (L) 11/24/2018 0500   GFRAA 29 (L) 10/21/2018 1055   GFRAA 45 (L)  10/07/2018 0823   GFRAA 43 (L) 09/30/2018 1005   CBC    Component Value Date/Time   WBC 6.4 12/06/2018 0359   RBC 2.63 (L) 11/27/2018 0359   HGB 7.8 (L) 12/04/2018 0359   HGB 9.1 (L) 10/21/2018 1055   HGB 11.3 (L) 09/17/2017 1428   HCT 26.0 (L) 11/29/2018 0359   HCT 35.8 09/17/2017 1428   PLT 185 12/09/2018 0359   PLT 271 10/21/2018 1055   PLT 231 09/17/2017 1428   MCV 98.9 11/27/2018 0359   MCV 93.2 09/17/2017 1428   MCH 29.7 11/20/2018 0359   MCHC 30.0 11/25/2018 0359   RDW 18.7 (H) 12/09/2018 0359   RDW 16.5 (H) 09/17/2017 1428   LYMPHSABS 0.6 (L) 11/14/2018 0400   LYMPHSABS 0.7 (L) 09/17/2017 1428   MONOABS 1.8 (H) 11/14/2018 0400   MONOABS 0.5 09/17/2017 1428   EOSABS 0.0 11/14/2018 0400   EOSABS 0.1 09/17/2017 1428   BASOSABS 0.1 11/14/2018 0400   BASOSABS 0.0  09/17/2017 1428   HEPATIC Function Panel Recent Labs    11/14/18 0400 11/15/18 0931 11/21/18 0430  PROT 5.2* 5.3* 5.0*   HEMOGLOBIN A1C No components found for: HGA1C,  MPG CARDIAC ENZYMES Lab Results  Component Value Date   CKTOTAL 52 08/21/2017   CKMB 1.3 08/21/2017   TROPONINI 0.94 (HH) 11/13/2018   TROPONINI 0.92 (HH) 11/13/2018   TROPONINI 0.80 (HH) 11/12/2018   BNP No results for input(s): PROBNP in the last 8760 hours. TSH Recent Labs    12/13/17 2243 10/02/18 1825  TSH 1.922 0.751   CHOLESTEROL Recent Labs    12/14/17 0457 09/02/18 0152 10/03/18 0637  CHOL 167 143 136    Scheduled Meds: . amiodarone  100 mg Per Tube Daily  . atorvastatin  40 mg Per Tube q1800  . budesonide (PULMICORT) nebulizer solution  0.25 mg Nebulization BID  . chlorhexidine  15 mL Mouth Rinse BID  . Chlorhexidine Gluconate Cloth  6 each Topical Daily  . feeding supplement (ENSURE ENLIVE)  237 mL Oral TID BM  . feeding supplement (PRO-STAT SUGAR FREE 64)  30 mL Oral BID  . fluconazole  100 mg Oral Daily  . ipratropium-albuterol  3 mL Nebulization BID  . mouth rinse  15 mL Mouth Rinse q12n4p  . potassium chloride  20 mEq Oral BID  . pyridostigmine  30 mg Per Tube BID  . sodium chloride flush  10-40 mL Intracatheter Q12H   Continuous Infusions: . dextrose 5 % and 0.45 % NaCl with KCl 20 mEq/L 30 mL (11/27/2018 0654)  . heparin 750 Units/hr (11/27/18 1513)   PRN Meds:.oxyCODONE, sodium chloride flush, traMADol  Assessment/Plan: V. Fib cardiac arrest Acute on chronic left systolic heart failure S/P Acute respiratory failure Possible PE Acute right leg DVT Anemia of chronic disease and iron deficiency CKD, III S/P stents in LAD and LCx CAD Hypoxic encephalopathy Multiple myeloma with skeletal abnormalities Sinus bradycardia  Continue holding B-blocker. Potassium supplement   LOS: 16 days    [name]  MD  12/04/2018, 9:26 AM

## 2018-11-28 NOTE — Progress Notes (Signed)
ANTICOAGULATION CONSULT NOTE  Pharmacy Consult:  Heparin Indication:  Acute DVT  Allergies  Allergen Reactions  . Sulfa Antibiotics Rash    Patient Measurements: Height: 5\' 5"  (165.1 cm) Weight: 151 lb 0.2 oz (68.5 kg) IBW/kg (Calculated) : 57 Heparin Dosing Weight: 72 kg  Vital Signs: Temp: 99.1 F (37.3 C) (02/09 1133) Temp Source: Oral (02/09 1133) BP: 142/80 (02/09 1133) Pulse Rate: 51 (02/09 1133)  Labs: Recent Labs    11/26/18 0355 11/27/18 0436  11/27/18 1315 11/27/18 2236 12/17/2018 0359 12/02/2018 0400  HGB 8.0* 7.7*  --   --   --  7.8*  --   HCT 25.9* 25.5*  --   --   --  26.0*  --   PLT 167 180  --   --   --  185  --   HEPARINUNFRC 0.28*  --    < > 0.71* 0.45  --  0.38  CREATININE  --  1.35*  --   --   --  1.14*  --    < > = values in this interval not displayed.    Estimated Creatinine Clearance: 44 mL/min (A) (by C-G formula based on SCr of 1.14 mg/dL (H)).   Assessment: 63 YOF with history of Afib not on anticoagulation PTA due to history of GIB.  Patient admitted s/p cardiac arrest, then developed an acute DVT and was started on IV heparin.  She subsequently had a RLE hematoma and heparin was held.  Hematoma improving and Pharmacy consulted to restart IV heparin without boluses.   -heparin level up to 0.38 on 750 units   Goal of Therapy:  Heparin level 0.3-0.5 units/ml (lower range d/t hematoma) Monitor platelets by anticoagulation protocol: Yes   Plan:  No heparin changes needed Daily heparin level and CBC F/U with transitioning to oral anticoagulation  Hildred Laser, PharmD Clinical Pharmacist **Pharmacist phone directory can now be found on [email or web address] ([email or web address]).  Listed under [place].

## 2018-11-28 NOTE — Progress Notes (Signed)
PROGRESS NOTE    Abigail [name]  HWY:[id number] DOB: June 22, 1946 DOA: 11/12/2018 PCP: Nolene Ebbs, MD   Brief Narrative: Abigail [name] is a 73 y.o. womanwith a hx ofmyasthenia gravis, atrial fibrillation, prolonged QTC, CKD,multiple myeloma, and generalized FTT. Patient presented secondary to VT/VF arrest x2. Defibrillated 3 times. Intubated prior to arrival to hospital. NG tube out and working on nutrition/physical therapy.    Assessment & Plan:   Active Problems:   Encounter for central line placement   Cardiac arrest with ventricular fibrillation (HCC)   Acute respiratory failure with hypoxia (HCC)   AKI (acute kidney injury) ([place])   Advanced care planning/counseling discussion   Goals of care, counseling/discussion   Palliative care by specialist   Thrush   Closed fracture of multiple ribs   Pneumonia   V-tach/V-fib arrest Evaluated by EP and not a candidate for an AICD. Started on amiodarone.  Tachy-brady syndrome EP consulted. Initially on amiodarone IV which was discontinued secondary to hypotension. Has been stable on amiodarone PO. -Cardiology recommendations: amiodarone  Acute respiratory failure with hypoxia Resolved. Procalcitonin in range not recommending initiation of antibiotics. Likely atelectasis related -Encourage incentive spirometer use  Hypoxic encephalopathy Improved.  Hypokalemia -Supplementation  Moderate malnutrition NG tube removed 11/25/2018. Poor motivation to eat.  -Encourage oral intake; continue dysphagia diet and advance as patient tolerates -Nutrition recommendations: Ensure TID, pro-stat BID  Multiple myeloma Diffuse lytic bone lesions. Last received Zometa in December. Chemo on hold  Myasthenia gravis -Continue pyridostigmine  Right lower leg hematoma Appears resolved.  Acute RLE DVT Currently on heparin drip. Hemoglobin stable. -Will transition to oral prior to discharge  Macrocytic anemia Acute on chronic. Currently stable.  On heparin.  Acute kidney injury on CKD III Baseline creatinine of 1.4. Peak creatinine of 1.86. improved. Currently at baseline. -AM BMP  Hypernatremia Likely secondary to poor oral hydration. -Encourage oral intake. If continues to rise, will start D5 fluids  CAD Cardiology on board. S/p PTCA to LAD/LCx in past. No cath performed this admission.   Generalized pain -Continue Oxycodone, tramadol, morphine prn  Abdominal pain Persistent. Previously evaluated and found to have non-specific colitis. -CT abdomen/pelvis   DVT prophylaxis: SCDs Code Status:   Code Status: DNR Family Communication: None at bedside Disposition Plan: Discharge likely to SNF when medically more stable (eating by mouth, keep well hydrated with IV intervention)   Consultants:   Cardiology  Palliative care  Procedures:   None  Antimicrobials:  Cefazolin    Subjective: No real concerns. Still with abdominal pain that is unchanged. Some right leg pain  Objective: Vitals:   11/27/18 2331 12/12/2018 0535 12/05/2018 0815 12/06/2018 1133  BP: (!) 146/65 135/68 (!) 157/77 (!) 142/80  Pulse: (!) 51 (!) 51 (!) 57 (!) 51  Resp: _0 Temp: 98.6 F (37 C) 97.9 F (36.6 C) 98.7 F (37.1 C) 99.1 F (37.3 C)  TempSrc: Oral Oral Oral Oral  SpO2: 100% 100% 100% 100%  Weight:  68.5 kg    Height:  _1  (1.651 m)      Intake/Output Summary (Last 24 hours) at 12/10/2018 1149 Last data filed at 12/01/2018 1135 Gross per 24 hour  Intake 1010.67 ml  Output 600 ml  Net 410.67 ml   Filed Weights   11/23/18 0500 11/27/18 0409 12/08/2018 0535  Weight: 71.1 kg 71.1 kg 68.5 kg    Examination:  General exam: Appears calm and comfortable Respiratory system: Clear to auscultation. Respiratory effort normal. Cardiovascular  system: S1 & S2 heard, RRR. No murmurs, rubs, gallops or clicks. Gastrointestinal system: Abdomen is nondistended, soft and mildly tender. No organomegaly or masses felt. Normal bowel  sounds heard. Central nervous system: Alert and oriented. No focal neurological deficits. Extremities: No edema. No calf tenderness Skin: No cyanosis. No rashes Psychiatry: Judgement and insight appear normal. Mood & affect appropriate.     Data Reviewed: I have personally reviewed following labs and imaging studies  CBC: Recent Labs  Lab 11/24/18 0500 11/25/18 0347 11/26/18 0355 11/27/18 0436 12/02/2018 0359  WBC 11.1* 10.3 7.6 5.9 6.4  HGB 8.0* 8.4* 8.0* 7.7* 7.8*  HCT 24.7* 25.9* 25.9* 25.5* 26.0*  MCV 92.5 92.2 94.9 96.2 98.9  PLT 159 164 167 180 062   Basic Metabolic Panel: Recent Labs  Lab 11/22/18 0327 11/23/18 0559 11/24/18 0500 11/27/18 0436 12/12/2018 0359  NA 147* 144 142 147* 144  K 5.5* 4.3 3.7 3.5 3.3*  CL 120* 113* 107 108 108  CO2 19* _0 GLUCOSE 119* 85 95 81 88  BUN 21 27* 26* 17 12  CREATININE 1.05* 1.22* 1.34* 1.35* 1.14*  CALCIUM 8.2* 8.1* 7.9* 7.8* 7.7*  PHOS  --   --  3.6  --   --    GFR: Estimated Creatinine Clearance: 44 mL/min (A) (by C-G formula based on SCr of 1.14 mg/dL (H)). Liver Function Tests: Recent Labs  Lab 11/24/18 0500  ALBUMIN 2.0*   No results for input(s): LIPASE, AMYLASE in the last 168 hours. No results for input(s): AMMONIA in the last 168 hours. Coagulation Profile: No results for input(s): INR, PROTIME in the last 168 hours. Cardiac Enzymes: No results for input(s): CKTOTAL, CKMB, CKMBINDEX, TROPONINI in the last 168 hours. BNP (last 3 results) No results for input(s): PROBNP in the last 8760 hours. HbA1C: No results for input(s): HGBA1C in the last 72 hours. CBG: No results for input(s): GLUCAP in the last 168 hours. Lipid Profile: No results for input(s): CHOL, HDL, LDLCALC, TRIG, CHOLHDL, LDLDIRECT in the last 72 hours. Thyroid Function Tests: No results for input(s): TSH, T4TOTAL, FREET4, T3FREE, THYROIDAB in the last 72 hours. Anemia Panel: No results for input(s): VITAMINB12, FOLATE, FERRITIN,  TIBC, IRON, RETICCTPCT in the last 72 hours. Sepsis Labs: Recent Labs  Lab 11/25/18 1131  PROCALCITON 0.20    No results found for this or any previous visit (from the past 240 hour(s)).       Radiology Studies: No results found.      Scheduled Meds: . amiodarone  100 mg Per Tube Daily  . atorvastatin  40 mg Per Tube q1800  . budesonide (PULMICORT) nebulizer solution  0.25 mg Nebulization BID  . chlorhexidine  15 mL Mouth Rinse BID  . Chlorhexidine Gluconate Cloth  6 each Topical Daily  . feeding supplement (ENSURE ENLIVE)  237 mL Oral TID BM  . feeding supplement (PRO-STAT SUGAR FREE 64)  30 mL Oral BID  . fluconazole  100 mg Oral Daily  . ipratropium-albuterol  3 mL Nebulization BID  . mouth rinse  15 mL Mouth Rinse q12n4p  . potassium chloride  20 mEq Oral BID  . pyridostigmine  30 mg Per Tube BID  . sodium chloride flush  10-40 mL Intracatheter Q12H   Continuous Infusions: . heparin 750 Units/hr (11/27/18 1513)     LOS: 16 days     Cordelia Poche, MD [facility] 12/05/2018, 11:49 AM  If 7PM-7AM, please contact night-coverage [email or web address]

## 2018-11-29 LAB — BASIC METABOLIC PANEL
Anion gap: 13 (ref 5–15)
BUN: 10 mg/dL (ref 8–23)
CO2: 23 mmol/L (ref 22–32)
Calcium: 8.1 mg/dL — ABNORMAL LOW (ref 8.9–10.3)
Chloride: 111 mmol/L (ref 98–111)
Creatinine, Ser: 1.04 mg/dL — ABNORMAL HIGH (ref 0.44–1.00)
GFR calc Af Amer: >60 mL/min (ref >60)
GFR calc non Af Amer: 54 mL/min — ABNORMAL LOW (ref >60)
Glucose, Bld: 80 mg/dL (ref 70–99)
Potassium: 4 mmol/L (ref 3.5–5.1)
Sodium: 147 mmol/L — ABNORMAL HIGH (ref 135–145)

## 2018-11-29 LAB — CBC
HEMATOCRIT: 27.4 % — AB (ref 36.0–46.0)
Hemoglobin: 8 g/dL — ABNORMAL LOW (ref 12.0–15.0)
MCH: 28.4 pg (ref 26.0–34.0)
MCHC: 29.2 g/dL — ABNORMAL LOW (ref 30.0–36.0)
MCV: 97.2 fL (ref 80.0–100.0)
Platelets: 205 10*3/uL (ref 150–400)
RBC: 2.82 MIL/uL — ABNORMAL LOW (ref 3.87–5.11)
RDW: 18.4 % — ABNORMAL HIGH (ref 11.5–15.5)
WBC: 6.1 10*3/uL (ref 4.0–10.5)
nRBC: 0 % (ref 0.0–0.2)

## 2018-11-29 LAB — HEPARIN LEVEL (UNFRACTIONATED): Heparin Unfractionated: 0.51 IU/mL (ref 0.30–0.70)

## 2018-11-29 MED ORDER — METOPROLOL TARTRATE 12.5 MG HALF TABLET
12.5000 mg | ORAL_TABLET | Freq: Every day | ORAL | Status: DC
  Administered 2018-11-29 – 2018-12-03 (×5): 12.5 mg via ORAL
  Filled 2018-11-29 (×5): qty 1

## 2018-11-29 MED ORDER — DEXTROSE-NACL 5-0.45 % IV SOLN
INTRAVENOUS | Status: DC
  Administered 2018-11-29 – 2018-11-30 (×2): via INTRAVENOUS

## 2018-11-29 NOTE — Progress Notes (Signed)
ANTICOAGULATION CONSULT NOTE  Pharmacy Consult:  Heparin Indication:  Acute DVT  Allergies  Allergen Reactions  . Sulfa Antibiotics Rash    Patient Measurements: Height: 5\' 5"  (165.1 cm) Weight: 147 lb 0.8 oz (66.7 kg) IBW/kg (Calculated) : 57 Heparin Dosing Weight: 72 kg  Vital Signs: Temp: 98.2 F (36.8 C) (02/10 0743) Temp Source: Oral (02/10 0743) BP: 168/67 (02/10 0743) Pulse Rate: 57 (02/10 0743)  Labs: Recent Labs    11/27/18 0436  11/27/18 2236 12/10/2018 0359 12/14/2018 0400 11/29/18 0631 11/29/18 0642  HGB 7.7*  --   --  7.8*  --   --  8.0*  HCT 25.5*  --   --  26.0*  --   --  27.4*  PLT 180  --   --  185  --   --  205  HEPARINUNFRC  --    < > 0.45  --  0.38 0.51  --   CREATININE 1.35*  --   --  1.14*  --   --  1.04*   < > = values in this interval not displayed.    Estimated Creatinine Clearance: 44.6 mL/min (A) (by C-G formula based on SCr of 1.04 mg/dL (H)).   Assessment: 40 YOF with history of Afib not on anticoagulation PTA due to history of GIB.  Patient admitted s/p cardiac arrest, then developed an acute DVT and was started on IV heparin.  She subsequently had a RLE hematoma and heparin was held.  Hematoma improving and Pharmacy consulted to restart IV heparin without boluses.    Heparin level 0.51 this AM   Goal of Therapy:  Heparin level 0.3-0.5 units/ml (lower range d/t hematoma) Monitor platelets by anticoagulation protocol: Yes   Plan:  Decrease heparin to 700 units / hr Daily heparin level and CBC F/U with transitioning to oral anticoagulation  Thank you Anette Guarneri, PharmD [phone]

## 2018-11-29 NOTE — Progress Notes (Signed)
Ref: Nolene Ebbs, MD   Subjective:  Feeling better. Oral intake remains poor. Mild hyponatremia. Monitor shows sinus rhythm with HR in 60's. Hypokalemia is corrected.  Objective:  Vital Signs in the last 24 hours: Temp:  [98.1 F (36.7 C)-99.1 F (37.3 C)] 98.2 F (36.8 C) (02/10 0743) Pulse Rate:  [47-58] 57 (02/10 0743) Cardiac Rhythm: Sinus bradycardia (02/10 0700) Resp:  [11-17] 12 (02/10 0743) BP: (137-168)/(63-82) 168/67 (02/10 0743) SpO2:  [92 %-100 %] 98 % (02/10 0827) Weight:  [66.7 kg] 66.7 kg (02/10 0444)  Physical Exam: BP Readings from Last 1 Encounters:  11/29/18 (!) 168/67     Wt Readings from Last 1 Encounters:  11/29/18 66.7 kg    Weight change: -1.8 kg Body mass index is 24.47 kg/m. HEENT: [address]/AT, Eyes-Brown, PERL, EOMI, Conjunctiva-Pale, Sclera-Non-icteric Neck: No JVD, No bruit, Trachea midline. Lungs:  Clear, Bilateral. Cardiac:  Regular rhythm, normal S1 and S2, no S3. II/VI systolic murmur. Abdomen:  Soft, non-tender. BS present. Extremities:  No edema present. No cyanosis. No clubbing. CNS: AxOx2, Cranial nerves grossly intact, moves all 4 extremities.  Skin: Warm and dry.   Intake/Output from previous day: 02/09 0701 - 02/10 0700 In: 650.7 [P.O.:390; I.V.:260.7] Out: 150 [Urine:150]    Lab Results: BMET    Component Value Date/Time   NA 147 (H) 11/29/2018 0642   NA 144 12/10/2018 0359   NA 147 (H) 11/27/2018 0436   NA 144 09/17/2017 1429   NA 141 07/30/2017 1504   NA 142 06/11/2017 1451   K 4.0 11/29/2018 0642   K 3.3 (L) 11/22/2018 0359   K 3.5 11/27/2018 0436   K 3.1 (L) 09/17/2017 1429   K 4.8 07/30/2017 1504   K 4.3 06/11/2017 1451   CL 111 11/29/2018 0642   CL 108 11/23/2018 0359   CL 108 11/27/2018 0436   CO2 23 11/29/2018 0642   CO2 26 11/20/2018 0359   CO2 27 11/27/2018 0436   CO2 23 09/17/2017 1429   CO2 20 (L) 07/30/2017 1504   CO2 20 (L) 06/11/2017 1451   GLUCOSE 80 11/29/2018 0642   GLUCOSE 88 12/13/2018 0359    GLUCOSE 81 11/27/2018 0436   GLUCOSE 96 09/17/2017 1429   GLUCOSE 79 07/30/2017 1504   GLUCOSE 74 06/11/2017 1451   BUN 10 11/29/2018 0642   BUN 12 12/01/2018 0359   BUN 17 11/27/2018 0436   BUN 15.6 09/17/2017 1429   BUN 24.4 07/30/2017 1504   BUN 18.7 06/11/2017 1451   CREATININE 1.04 (H) 11/29/2018 0642   CREATININE 1.14 (H) 12/12/2018 0359   CREATININE 1.35 (H) 11/27/2018 0436   CREATININE 1.97 (H) 10/21/2018 1055   CREATININE 1.36 (H) 10/07/2018 0823   CREATININE 1.41 (H) 09/30/2018 1005   CREATININE 1.0 09/17/2017 1429   CREATININE 1.2 (H) 07/30/2017 1504   CREATININE 1.2 (H) 06/11/2017 1451   CALCIUM 8.1 (L) 11/29/2018 0642   CALCIUM 7.7 (L) 12/15/2018 0359   CALCIUM 7.8 (L) 11/27/2018 0436   CALCIUM 8.6 09/17/2017 1429   CALCIUM 9.1 07/30/2017 1504   CALCIUM 9.6 06/11/2017 1451   GFRNONAA 54 (L) 11/29/2018 0642   GFRNONAA 48 (L) 12/12/2018 0359   GFRNONAA 39 (L) 11/27/2018 0436   GFRNONAA 25 (L) 10/21/2018 1055   GFRNONAA 39 (L) 10/07/2018 0823   GFRNONAA 37 (L) 09/30/2018 1005   GFRAA >60 11/29/2018 0642   GFRAA 56 (L) 12/04/2018 0359   GFRAA 46 (L) 11/27/2018 0436   GFRAA 29 (L) 10/21/2018 1055  GFRAA 45 (L) 10/07/2018 0823   GFRAA 43 (L) 09/30/2018 1005   CBC    Component Value Date/Time   WBC 6.1 11/29/2018 0642   RBC 2.82 (L) 11/29/2018 0642   HGB 8.0 (L) 11/29/2018 0642   HGB 9.1 (L) 10/21/2018 1055   HGB 11.3 (L) 09/17/2017 1428   HCT 27.4 (L) 11/29/2018 0642   HCT 35.8 09/17/2017 1428   PLT 205 11/29/2018 0642   PLT 271 10/21/2018 1055   PLT 231 09/17/2017 1428   MCV 97.2 11/29/2018 0642   MCV 93.2 09/17/2017 1428   MCH 28.4 11/29/2018 0642   MCHC 29.2 (L) 11/29/2018 0642   RDW 18.4 (H) 11/29/2018 0642   RDW 16.5 (H) 09/17/2017 1428   LYMPHSABS 0.6 (L) 11/14/2018 0400   LYMPHSABS 0.7 (L) 09/17/2017 1428   MONOABS 1.8 (H) 11/14/2018 0400   MONOABS 0.5 09/17/2017 1428   EOSABS 0.0 11/14/2018 0400   EOSABS 0.1 09/17/2017 1428   BASOSABS  0.1 11/14/2018 0400   BASOSABS 0.0 09/17/2017 1428   HEPATIC Function Panel Recent Labs    11/14/18 0400 11/15/18 0931 11/21/18 0430  PROT 5.2* 5.3* 5.0*   HEMOGLOBIN A1C No components found for: HGA1C,  MPG CARDIAC ENZYMES Lab Results  Component Value Date   CKTOTAL 52 08/21/2017   CKMB 1.3 08/21/2017   TROPONINI 0.94 (HH) 11/13/2018   TROPONINI 0.92 (HH) 11/13/2018   TROPONINI 0.80 (HH) 11/12/2018   BNP No results for input(s): PROBNP in the last 8760 hours. TSH Recent Labs    12/13/17 2243 10/02/18 1825  TSH 1.922 0.751   CHOLESTEROL Recent Labs    12/14/17 0457 09/02/18 0152 10/03/18 0637  CHOL 167 143 136    Scheduled Meds: . amiodarone  100 mg Per Tube Daily  . atorvastatin  40 mg Per Tube q1800  . budesonide (PULMICORT) nebulizer solution  0.25 mg Nebulization BID  . chlorhexidine  15 mL Mouth Rinse BID  . Chlorhexidine Gluconate Cloth  6 each Topical Daily  . feeding supplement (ENSURE ENLIVE)  237 mL Oral TID BM  . feeding supplement (PRO-STAT SUGAR FREE 64)  30 mL Oral BID  . fluconazole  100 mg Oral Daily  . ipratropium-albuterol  3 mL Nebulization BID  . mouth rinse  15 mL Mouth Rinse q12n4p  . pyridostigmine  30 mg Per Tube BID  . sodium chloride flush  10-40 mL Intracatheter Q12H   Continuous Infusions: . dextrose 5 % and 0.45% NaCl 40 mL/hr at 11/29/18 1000  . heparin 700 Units/hr (11/29/18 1002)   PRN Meds:.oxyCODONE, sodium chloride flush, traMADol  Assessment/Plan: V. Fib cardiac arrest Acute on chronic left systolic heart failure S/P acute respiratory failure Possible PE Acute right leg DVT Anemia of chronic disease and iron deficiency CKD, III CAD S/P Stents in LAD and LCx Hypoxic encephalopathy Multiple myeloma with skeletal abnormalities Sinus bradycardia, improving  Resume small dose Beta blocker.   LOS: 17 days    [name]  MD  11/29/2018, 10:20 AM

## 2018-11-29 NOTE — Progress Notes (Signed)
Physical Therapy Treatment Patient Details Name: Abigail [name] MRN: [id number] DOB: 1946-01-21 Today's Date: 11/29/2018    History of Present Illness Pt is a 73 y.o. female admitted 11/12/18 with VT/VF arrest, overall time to ROSC estimated 12 minutes; was noted to be posturing with R-side gaze preference in the ED. ETT 1/24-1/27. Also with RLE DVT. PMH Includes CKD III, myasthenia gravis, multiple myeloma, failure to thrive, recent admission for infectious colitis/septic shock.     PT Comments    Pt admitted with above diagnosis. Pt currently with functional limitations due to the deficits listed below (see PT Problem List). Pt was able to ambulate a short distance with min assist and RW but needs multimodal cues verbal and tactile to complete all tasks due to cognitive issues.   Pt will need SNF.  Pt will benefit from skilled PT to increase their independence and safety with mobility to allow discharge to the venue listed below.     Follow Up Recommendations  SNF;Supervision/Assistance - 24 hour     Equipment Recommendations  None recommended by PT    Recommendations for Other Services       Precautions / Restrictions Precautions Precautions: Fall;Other (comment) Restrictions Weight Bearing Restrictions: No    Mobility  Bed Mobility Overal bed mobility: Needs Assistance Bed Mobility: Supine to Sit;Rolling;Sit to Supine Rolling: Min assist Sidelying to sit: Min assist;+2 for safety/equipment Supine to sit: Min assist;+2 for physical assistance     General bed mobility comments: Pt needed only min assist for trunk to come to EOB.  Able to tell PT she wanted to get onto 3N1.  Transfers Overall transfer level: Needs assistance Equipment used: Rolling walker (2 wheeled) Transfers: Sit to/from Stand Sit to Stand: Min assist;+2 safety/equipment Stand pivot transfers: Min assist;+2 safety/equipment       General transfer comment: Pt needed incr cues verbally and tactilly for sit  to stand to sit.  Pt takes incr time to follwo commands but with cues, able to stand and pivot with RW with min assist and fairly good stabiltiy.   Ambulation/Gait Ambulation/Gait assistance: Min assist;+2 safety/equipment Gait Distance (Feet): 5 Feet Assistive device: Rolling walker (2 wheeled) Gait Pattern/deviations: Step-through pattern;Decreased stride length Gait velocity: decr  Gait velocity interpretation: <1.31 ft/sec, indicative of household ambulator General Gait Details: Pt needed incr cues to keep hands on RW.  Pt needed cues to take each step as it appears her processng is very delayed.  She needed verbala nd tactile cuing for each step.    Stairs             Wheelchair Mobility    Modified Rankin (Stroke Patients Only)       Balance Overall balance assessment: Needs assistance Sitting-balance support: Feet supported;No upper extremity supported Sitting balance-Leahy Scale: Fair Sitting balance - Comments: Min guard to sit EOB    Standing balance support: During functional activity;Bilateral upper extremity supported Standing balance-Leahy Scale: Poor Standing balance comment: Requires BUE support on RW and external support due to processing issues.                             Cognition Arousal/Alertness: Awake/alert Behavior During Therapy: Flat affect Overall Cognitive Status: No family/caregiver present to determine baseline cognitive functioning Area of Impairment: Attention;Following commands;Awareness;Problem solving                 Orientation Level: Disoriented to;Time;Situation Current Attention Level: Focused   Following Commands:  Follows one step commands inconsistently;Follows one step commands with increased time   Awareness: Intellectual Problem Solving: Requires verbal cues;Decreased initiation;Slow processing;Difficulty sequencing;Requires tactile cues General Comments: Pt needs repetition to follow commands as well as  needs multimodal cues at times.       Exercises General Exercises - Lower Extremity Ankle Circles/Pumps: Both;10 reps;Supine;AROM Long [employer]: Both;10 reps;Seated;AROM    General Comments        Pertinent Vitals/Pain      Home Living                      Prior Function            PT Goals (current goals can now be found in the care plan section) Acute Rehab PT Goals Patient Stated Goal: unable to state Progress towards PT goals: Progressing toward goals    Frequency    Min 2X/week      PT Plan Current plan remains appropriate    Co-evaluation              AM-PAC PT "6 Clicks" Mobility   Outcome Measure  Help needed turning from your back to your side while in a flat bed without using bedrails?: A Lot Help needed moving from lying on your back to sitting on the side of a flat bed without using bedrails?: A Lot Help needed moving to and from a bed to a chair (including a wheelchair)?: Total Help needed standing up from a chair using your arms (e.g., wheelchair or bedside chair)?: Total Help needed to walk in hospital room?: Total Help needed climbing 3-5 steps with a railing? : Total 6 Click Score: 8    End of Session Equipment Utilized During Treatment: Gait belt Activity Tolerance: Other (comment);Patient limited by fatigue(cognition) Patient left: with call bell/phone within reach;in chair;with chair alarm set Nurse Communication: Mobility status PT Visit Diagnosis: Muscle weakness (generalized) (M62.81);Unsteadiness on feet (R26.81);Difficulty in walking, not elsewhere classified (R26.2);Pain Pain - Right/Left: Right Pain - part of body: Leg     Time: 1340-1402 PT Time Calculation (min) (ACUTE ONLY): 22 min  Charges:  $Gait Training: 8-22 mins                     [place] Pager:  [phone]  Office:  [place] 11/29/2018, 3:33 PM

## 2018-11-29 NOTE — Social Work (Signed)
CSW spoke to patient's sister, [name] on the phone to review SNF bed offer. Dorothy at work and unable to write down the facilities. CSW left SNF list at bedside. Dorothy to try to visit patient tonight and review the list.   CSW will initiate [facility], but will need SNF choice to complete auth.   Did discuss with MD. Noted patient still with poor PO intake. CSW to follow for feeding plan and will continue to support with disposition planning.  Estanislado Emms, LCSW [phone]

## 2018-11-29 NOTE — Progress Notes (Signed)
PROGRESS NOTE    Abigail [name]  QMG:[id number] DOB: [birth year] DOA: 11/12/2018 PCP: Nolene Ebbs, MD   Brief Narrative: Abigail [name] is a 73 y.o. womanwith a hx ofmyasthenia gravis, atrial fibrillation, prolonged QTC, CKD,multiple myeloma, and generalized FTT. Patient presented secondary to VT/VF arrest x2. Defibrillated 3 times. Intubated prior to arrival to hospital. NG tube out and working on nutrition/physical therapy.    Assessment & Plan:   Active Problems:   Encounter for central line placement   Cardiac arrest with ventricular fibrillation (HCC)   Acute respiratory failure with hypoxia (HCC)   AKI (acute kidney injury) ([facility])   Advanced care planning/counseling discussion   Goals of care, counseling/discussion   Palliative care by specialist   Thrush   Closed fracture of multiple ribs   Pneumonia   V-tach/V-fib arrest Evaluated by EP and not a candidate for an AICD. Started on amiodarone.  Tachy-brady syndrome EP consulted. Initially on amiodarone IV which was discontinued secondary to hypotension. Has been stable on amiodarone PO. -Cardiology recommendations: amiodarone, metoprolol 12.5 started 2/10  Acute respiratory failure with hypoxia Resolved. Procalcitonin in range not recommending initiation of antibiotics. Likely atelectasis related -Encourage incentive spirometer use  Hypoxic encephalopathy Improved.  Hypokalemia -Supplementation  Moderate malnutrition NG tube removed 11/25/2018. Poor motivation to eat. Poor oral intake. Discussed concern for more permanent feeding tube if patient's intake does not improve. -Encourage oral intake; continue dysphagia diet and advance as patient tolerates -Nutrition recommendations: Ensure TID, pro-stat BID  Multiple myeloma Diffuse lytic bone lesions. Last received Zometa in December. Chemo on hold  Myasthenia gravis -Continue pyridostigmine  Right lower leg hematoma Appears resolved.  Acute RLE DVT Currently on  heparin drip. Hemoglobin stable. -Heparin drip, switch to Eliquis tomorrow  Macrocytic anemia Acute on chronic. Currently stable. On heparin.  Acute kidney injury on CKD III Baseline creatinine of 1.4. Peak creatinine of 1.86. improved. Currently at baseline.  Hypernatremia Likely secondary to poor oral hydration. Worsening again. -Encourage oral intake -Restart D5 IV fluids  CAD Cardiology on board. S/p PTCA to LAD/LCx in past. No cath performed this admission.   Generalized pain -Continue Oxycodone, tramadol, morphine prn  Abdominal pain Persistent. Previously evaluated and found to have non-specific colitis which has improved on repeat CT scan  Atelectasis -Encourage incentive spirometer use   DVT prophylaxis: SCDs Code Status:   Code Status: DNR Family Communication: None at bedside Disposition Plan: Discharge likely to SNF when medically more stable (eating by mouth, keep well hydrated with IV intervention)   Consultants:   Cardiology  Palliative care  Procedures:   None  Antimicrobials:  Cefazolin    Subjective: Bothered by being highly encouraged to eat. Still with some abdominal pain.  Objective: Vitals:   11/29/18 0444 11/29/18 0743 11/29/18 0827 11/29/18 1100  BP: (!) 161/77 (!) 168/67  (!) 131/58  Pulse: (!) 52 (!) 57  (!) 57  Resp: _0 Temp: 98.6 F (37 C) 98.2 F (36.8 C)  98.3 F (36.8 C)  TempSrc: Oral Oral  Oral  SpO2: 93% 94% 98% 99%  Weight: 66.7 kg     Height:        Intake/Output Summary (Last 24 hours) at 11/29/2018 1232 Last data filed at 11/29/2018 1100 Gross per 24 hour  Intake 457.11 ml  Output -  Net 457.11 ml   Filed Weights   11/27/18 0409 12/15/2018 0535 11/29/18 0444  Weight: 71.1 kg 68.5 kg 66.7 kg    Examination:  General exam: Appears calm and comfortable  Respiratory system: Diminished breath sounds. Respiratory effort normal. Cardiovascular system: S1 & S2 heard, RRR. No murmurs, rubs, gallops or  clicks. Gastrointestinal system: Abdomen is nondistended, soft and mildly tender. No organomegaly or masses felt. Normal bowel sounds heard. Central nervous system: Alert and oriented. No focal neurological deficits. Extremities: No edema. No calf tenderness Skin: No cyanosis. No rashes Psychiatry: Judgement and insight appear normal. Depressed mood, flat affect.     Data Reviewed: I have personally reviewed following labs and imaging studies  CBC: Recent Labs  Lab 11/25/18 0347 11/26/18 0355 11/27/18 0436 12/05/2018 0359 11/29/18 0642  WBC 10.3 7.6 5.9 6.4 6.1  HGB 8.4* 8.0* 7.7* 7.8* 8.0*  HCT 25.9* 25.9* 25.5* 26.0* 27.4*  MCV 92.2 94.9 96.2 98.9 97.2  PLT 164 167 180 185 751   Basic Metabolic Panel: Recent Labs  Lab 11/23/18 0559 11/24/18 0500 11/27/18 0436 11/22/2018 0359 11/29/18 0642  NA 144 142 147* 144 147*  K 4.3 3.7 3.5 3.3* 4.0  CL 113* 107 108 108 111  CO2 _0 GLUCOSE 85 95 81 88 80  BUN 27* 26* _1 CREATININE 1.22* 1.34* 1.35* 1.14* 1.04*  CALCIUM 8.1* 7.9* 7.8* 7.7* 8.1*  PHOS  --  3.6  --   --   --    GFR: Estimated Creatinine Clearance: 44.6 mL/min (A) (by C-G formula based on SCr of 1.04 mg/dL (H)). Liver Function Tests: Recent Labs  Lab 11/24/18 0500  ALBUMIN 2.0*   No results for input(s): LIPASE, AMYLASE in the last 168 hours. No results for input(s): AMMONIA in the last 168 hours. Coagulation Profile: No results for input(s): INR, PROTIME in the last 168 hours. Cardiac Enzymes: No results for input(s): CKTOTAL, CKMB, CKMBINDEX, TROPONINI in the last 168 hours. BNP (last 3 results) No results for input(s): PROBNP in the last 8760 hours. HbA1C: No results for input(s): HGBA1C in the last 72 hours. CBG: No results for input(s): GLUCAP in the last 168 hours. Lipid Profile: No results for input(s): CHOL, HDL, LDLCALC, TRIG, CHOLHDL, LDLDIRECT in the last 72 hours. Thyroid Function Tests: No results for input(s): TSH,  T4TOTAL, FREET4, T3FREE, THYROIDAB in the last 72 hours. Anemia Panel: No results for input(s): VITAMINB12, FOLATE, FERRITIN, TIBC, IRON, RETICCTPCT in the last 72 hours. Sepsis Labs: Recent Labs  Lab 11/25/18 1131  PROCALCITON 0.20    No results found for this or any previous visit (from the past 240 hour(s)).       Radiology Studies: Ct Abdomen Pelvis Wo Contrast  Result Date: 11/20/2018 CLINICAL DATA:  Abdominal pain and decreased oral intake, history of multiple myeloma EXAM: CT ABDOMEN AND PELVIS WITHOUT CONTRAST TECHNIQUE: Multidetector CT imaging of the abdomen and pelvis was performed following the standard protocol without IV contrast. COMPARISON:  10/22/2018 FINDINGS: Lower chest: Bilateral pleural effusions are noted small on the left and moderate on the right. Some associated lower lobe consolidation/atelectasis is noted increased from the prior exam. Hepatobiliary: Liver is within normal limits. There is dependent density within the gallbladder likely related to vicarious excretion of contrast material. Previous CT did not demonstrate any evidence of cholelithiasis. Pancreas: Unremarkable. No pancreatic ductal dilatation or surrounding inflammatory changes. Spleen: Normal in size without focal abnormality. Accessory splenules are noted. Adrenals/Urinary Tract: Adrenal glands are within normal limits. Kidneys are well visualized bilaterally. No calculi or obstructive changes are seen. Hypo and hyperdense left renal cysts are again identified and  stable from the recent exam. Bilateral renal artery stents are noted. Bladder is decompressed Stomach/Bowel: Scattered diverticular changes noted within the colon. The previously seen wall thickening in the descending colon has resolved in the interval from the prior exam. No findings to suggest diverticulitis are seen. No obstructive or inflammatory changes of the large or small bowel are noted. The appendix is not well visualized similar to  that seen on the prior study. Vascular/Lymphatic: Aortic atherosclerosis. No enlarged abdominal or pelvic lymph nodes. Reproductive: Status post hysterectomy. No adnexal masses. Other: No abdominal wall hernia or abnormality. No abdominopelvic ascites. Musculoskeletal: Degenerative changes of the lumbar spine are seen stable from the prior exam. No acute bony abnormality is noted. Stable myelomatous changes at T12 are seen. IMPRESSION: New bilateral lower lobe consolidation/atelectasis with associated effusions right greater than left. Resolution of previously seen descending and sigmoid colon colitis. Chronic changes as described above without acute abnormality. Electronically Signed   By: Inez Catalina M.D.   On: 11/27/2018 19:37        Scheduled Meds: . amiodarone  100 mg Per Tube Daily  . atorvastatin  40 mg Per Tube q1800  . budesonide (PULMICORT) nebulizer solution  0.25 mg Nebulization BID  . chlorhexidine  15 mL Mouth Rinse BID  . Chlorhexidine Gluconate Cloth  6 each Topical Daily  . feeding supplement (ENSURE ENLIVE)  237 mL Oral TID BM  . feeding supplement (PRO-STAT SUGAR FREE 64)  30 mL Oral BID  . fluconazole  100 mg Oral Daily  . ipratropium-albuterol  3 mL Nebulization BID  . mouth rinse  15 mL Mouth Rinse q12n4p  . metoprolol tartrate  12.5 mg Oral Daily  . pyridostigmine  30 mg Per Tube BID  . sodium chloride flush  10-40 mL Intracatheter Q12H   Continuous Infusions: . dextrose 5 % and 0.45% NaCl 40 mL/hr at 11/29/18 1100  . heparin 700 Units/hr (11/29/18 1100)     LOS: 17 days     Cordelia Poche, MD [facility] 11/29/2018, 12:32 PM  If 7PM-7AM, please contact night-coverage [email or web address]

## 2018-11-30 DIAGNOSIS — D630 Anemia in neoplastic disease: Secondary | ICD-10-CM

## 2018-11-30 DIAGNOSIS — I4891 Unspecified atrial fibrillation: Secondary | ICD-10-CM

## 2018-11-30 DIAGNOSIS — I82401 Acute embolism and thrombosis of unspecified deep veins of right lower extremity: Secondary | ICD-10-CM

## 2018-11-30 DIAGNOSIS — Z8674 Personal history of sudden cardiac arrest: Secondary | ICD-10-CM

## 2018-11-30 DIAGNOSIS — R630 Anorexia: Secondary | ICD-10-CM

## 2018-11-30 DIAGNOSIS — Z7901 Long term (current) use of anticoagulants: Secondary | ICD-10-CM

## 2018-11-30 LAB — HEPARIN LEVEL (UNFRACTIONATED): Heparin Unfractionated: 0.34 IU/mL (ref 0.30–0.70)

## 2018-11-30 LAB — CBC
HCT: 27.3 % — ABNORMAL LOW (ref 36.0–46.0)
HEMOGLOBIN: 8 g/dL — AB (ref 12.0–15.0)
MCH: 28.6 pg (ref 26.0–34.0)
MCHC: 29.3 g/dL — AB (ref 30.0–36.0)
MCV: 97.5 fL (ref 80.0–100.0)
Platelets: 210 10*3/uL (ref 150–400)
RBC: 2.8 MIL/uL — ABNORMAL LOW (ref 3.87–5.11)
RDW: 18.4 % — ABNORMAL HIGH (ref 11.5–15.5)
WBC: 6.5 10*3/uL (ref 4.0–10.5)
nRBC: 0 % (ref 0.0–0.2)

## 2018-11-30 LAB — BASIC METABOLIC PANEL
Anion gap: 8 (ref 5–15)
BUN: 8 mg/dL (ref 8–23)
CO2: 24 mmol/L (ref 22–32)
Calcium: 8.3 mg/dL — ABNORMAL LOW (ref 8.9–10.3)
Chloride: 112 mmol/L — ABNORMAL HIGH (ref 98–111)
Creatinine, Ser: 0.97 mg/dL (ref 0.44–1.00)
GFR calc Af Amer: >60 mL/min (ref >60)
GFR calc non Af Amer: 59 mL/min — ABNORMAL LOW (ref >60)
Glucose, Bld: 92 mg/dL (ref 70–99)
POTASSIUM: 3.4 mmol/L — AB (ref 3.5–5.1)
Sodium: 144 mmol/L (ref 135–145)

## 2018-11-30 MED ORDER — ENOXAPARIN SODIUM 80 MG/0.8ML ~~LOC~~ SOLN
65.0000 mg | Freq: Two times a day (BID) | SUBCUTANEOUS | Status: DC
  Filled 2018-11-30: qty 0.8

## 2018-11-30 MED ORDER — MIRTAZAPINE 7.5 MG PO TABS
7.5000 mg | ORAL_TABLET | Freq: Every day | ORAL | Status: DC
  Administered 2018-11-30 – 2018-12-01 (×2): 7.5 mg via ORAL
  Filled 2018-11-30 (×2): qty 1

## 2018-11-30 MED ORDER — ENOXAPARIN SODIUM 60 MG/0.6ML ~~LOC~~ SOLN
60.0000 mg | Freq: Two times a day (BID) | SUBCUTANEOUS | Status: DC
  Administered 2018-11-30 – 2018-12-02 (×5): 60 mg via SUBCUTANEOUS
  Filled 2018-11-30 (×4): qty 0.6

## 2018-11-30 MED ORDER — MIRTAZAPINE 7.5 MG PO TABS: 15.0000 mg | ORAL_TABLET | Freq: Every day | ORAL | Status: DC

## 2018-11-30 MED ORDER — POTASSIUM CHLORIDE ER 8 MEQ PO CPCR
8.0000 meq | ORAL_CAPSULE | Freq: Two times a day (BID) | ORAL | Status: DC
  Administered 2018-11-30 (×2): 8 meq via ORAL
  Filled 2018-11-30 (×3): qty 1

## 2018-11-30 NOTE — Progress Notes (Signed)
IP PROGRESS NOTE  Subjective:   Abigail [name] is well-known to me with a history of multiple myeloma.  Systemic treatment has been on hold secondary to unexplained abdominal pain.  She was admitted 11/12/2018 with a V. fib cardiac arrest.  I have been checking on her periodically during this hospital admission. She developed acute respiratory failure, acute kidney injury, and a right lower extremity DVT during this hospital admission.  Abigail [name] is now recovering from the cardiac arrest.  She is now on a diet and dissipating in physical therapy.  Objective: Vital signs in last 24 hours: Blood pressure (!) 155/73, pulse (!) 55, temperature 98 F (36.7 C), temperature source Oral, resp. rate 12, height '5\' 5"'  (1.651 m), weight 149 lb 4 oz (67.7 kg), SpO2 97 %.  Intake/Output from previous day: 02/10 0701 - 02/11 0700 In: 1300 [P.O.:310; I.V.:990] Out: 350 [Urine:350]  Physical Exam:  HEENT: No thrush Lungs: Inspiratory rhonchi at the lower chest bilaterally, no respiratory distress Cardiac: Regular rate and rhythm Abdomen: Nontender, no hepatosplenomegaly Extremities: The right lower leg is larger than the left side Logic: Alert, follows commands   Lab Results: Recent Labs    11/29/18 0642 11/30/18 0355  WBC 6.1 6.5  HGB 8.0* 8.0*  HCT 27.4* 27.3*  PLT 205 210    BMET Recent Labs    11/29/18 0642 11/30/18 0355  NA 147* 144  K 4.0 3.4*  CL 111 112*  CO2 23 24  GLUCOSE 80 92  BUN 10 8  CREATININE 1.04* 0.97  CALCIUM 8.1* 8.3*    No results found for: CEA1  Studies/Results: Ct Abdomen Pelvis Wo Contrast  Result Date: 12/17/2018 CLINICAL DATA:  Abdominal pain and decreased oral intake, history of multiple myeloma EXAM: CT ABDOMEN AND PELVIS WITHOUT CONTRAST TECHNIQUE: Multidetector CT imaging of the abdomen and pelvis was performed following the standard protocol without IV contrast. COMPARISON:  10/22/2018 FINDINGS: Lower chest: Bilateral pleural effusions are noted small  on the left and moderate on the right. Some associated lower lobe consolidation/atelectasis is noted increased from the prior exam. Hepatobiliary: Liver is within normal limits. There is dependent density within the gallbladder likely related to vicarious excretion of contrast material. Previous CT did not demonstrate any evidence of cholelithiasis. Pancreas: Unremarkable. No pancreatic ductal dilatation or surrounding inflammatory changes. Spleen: Normal in size without focal abnormality. Accessory splenules are noted. Adrenals/Urinary Tract: Adrenal glands are within normal limits. Kidneys are well visualized bilaterally. No calculi or obstructive changes are seen. Hypo and hyperdense left renal cysts are again identified and stable from the recent exam. Bilateral renal artery stents are noted. Bladder is decompressed Stomach/Bowel: Scattered diverticular changes noted within the colon. The previously seen wall thickening in the descending colon has resolved in the interval from the prior exam. No findings to suggest diverticulitis are seen. No obstructive or inflammatory changes of the large or small bowel are noted. The appendix is not well visualized similar to that seen on the prior study. Vascular/Lymphatic: Aortic atherosclerosis. No enlarged abdominal or pelvic lymph nodes. Reproductive: Status post hysterectomy. No adnexal masses. Other: No abdominal wall hernia or abnormality. No abdominopelvic ascites. Musculoskeletal: Degenerative changes of the lumbar spine are seen stable from the prior exam. No acute bony abnormality is noted. Stable myelomatous changes at T12 are seen. IMPRESSION: New bilateral lower lobe consolidation/atelectasis with associated effusions right greater than left. Resolution of previously seen descending and sigmoid colon colitis. Chronic changes as described above without acute abnormality. Electronically Signed  By: Inez Catalina M.D.   On: 12/15/2018 19:37    Medications: I  have reviewed the patient's current medications.  Assessment/Plan:  1. Multiple myeloma, IgG lambda, bone marrow biopsy 06/15/2016 confirmed multiple myeloma; cytogenetics by FISHshow +11, +12, 13q-  Elevated serum free lambda light chains  Lambda light chain proteinuria  Lytic bone lesions on a bone survey 06/13/2016  Initiation of weekly Velcade/Decadron 06/19/2016  Serum light chains improved 07/31/2016  Initiation of Revlimid 11/03/20172 weeks on/2 weeks off  Serum M spike and IgG significantly improved 09/25/2016  Velcade held on 10/30/2016 due to urinary retention  Treatment resumed with Revlimid (2 weeks on/2 weeks off) and weekly Decadron on 02/12/2017, treatment placed on hold May 2018 secondary to admissions with cardiac disease  05/27/2018-increased serum M spike, IgG, serum lambda light chains  Bone survey 05/31/2018-widespread myeloma without detectable progression since 06/13/2016.  Cycle 1 Cytoxan/Velcade/Decadron 06/24/2018  Cycle 2 Cytoxan/Velcade/Decadron 07/22/2018  08/09/2018 serum light chains, M spike, IgGimproved  Cycle for Cytoxan/Velcade/Decadron 09/24/2018 2. Severe anemia secondary to #1-markedly improved  3. Diffuse lytic bone lesions secondary to multiple myeloma, status post Zometa 09/18/2016(plan to continue every 3 months);most recent Zometa infusion 10/08/2017  4. History of coronary artery disease/myocardial infarction  5. History of colon polyps  6. Bilateral leg edema 09/04/2016. Negative bilateral venous Doppler 09/05/2016.  7. Mild periorbital edema 09/18/2016, question related to early stye formation, question Velcade related chalazia.Improved.  8. CT scan 10/27/2016 with a new right kidney massevaluated by urology; MRI abdomen 11/18/2016 multiple bilateral renal cysts. Most of these are simple cysts. The 2 lesions in question on the CT scan are both hemorrhagic or proteinaceous cysts. No worrisome contrast  enhancement.  9. Urinary retention 10/28/2016 status post evaluation in the emergency department. Urinalysis negative for signs of infection 10/30/2016. Velcade held. Resolved.  10. Abdominal pain, nausea, vomiting.Etiology unclear. Upper endoscopy 11/18/2016 with findings of reflux esophagitis, benign appearing esophageal stenosis, small hiatal hernia and erythematous duodenopathy. CT abdomen/pelvis 11/19/2016 with no acute abnormality.  11. 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 Left lower extremity DVT May 2018.Maintainedon Coumadin.Coumadin discontinued October 2018 secondary to a supratherapeutic PT/INR and GI bleeding  13.Admission with GI bleeding October and November 2018-felt secondary to diverticular bleeding, initially while on supratherapeutic Coumadin, colonoscopy 09/04/2017-no source of blood loss identified, superficial tear at the posterior anal mucosa without stigmata of bleeding  14. Acute creatinine elevation 3.26 on 08/05/2018 of cycle 2 day 15; confirmed on repeat 3.13 on 08/06/18; improved  15.  Admission 102-21-2019 with rapid atrial fibrillation, now maintained on amiodarone 16.  Admission 10/15/2018 with atrial fibrillation 17.  Admission 11/12/2018 with a V. fib cardiac arrest, respiratory failure, acute renal failure, and hypoxic encephalopathy 18.  Right lower extremity DVT 11/15/2018-on Lovenox  Ms. Gohlke has a history of multiple myeloma, currently maintained off of systemic therapy after last being treated with Cytoxan/Decadron/Velcade 10/07/2018.  She is recovering from a V. fib cardiac arrest with multiple complications. It appears she will be discharged to a skilled nursing facility to continue physical therapy and recover from anoxic brain injury.  I will reassess the myeloma while she is here.  Recommendations: 1.  Continue management of heart failure and arrhythmias per cardiology 2.  Skilled nursing facility  placement 3.  Restage the myeloma 4.  Outpatient follow-up will be scheduled at [facility]    LOS: 18 days   [name], MD   11/30/2018, 7:32 AM

## 2018-11-30 NOTE — Progress Notes (Signed)
ANTICOAGULATION CONSULT NOTE  Pharmacy Consult:  Heparin to Lovenox Indication:  Acute DVT  Allergies  Allergen Reactions  . Sulfa Antibiotics Rash    Patient Measurements: Height: 5\' 5"  (165.1 cm) Weight: 149 lb 4 oz (67.7 kg) IBW/kg (Calculated) : 57 Heparin Dosing Weight: 72 kg  Vital Signs: Temp: 98.7 F (37.1 C) (02/11 0822) Temp Source: Oral (02/11 0822) BP: 156/70 (02/11 0854) Pulse Rate: 56 (02/11 0854)  Labs: Recent Labs    12/02/2018 0359 12/02/2018 0400 11/29/18 0631 11/29/18 0642 11/30/18 0343 11/30/18 0355  HGB 7.8*  --   --  8.0*  --  8.0*  HCT 26.0*  --   --  27.4*  --  27.3*  PLT 185  --   --  205  --  210  HEPARINUNFRC  --  0.38 0.51  --  0.34  --   CREATININE 1.14*  --   --  1.04*  --  0.97    Estimated Creatinine Clearance: 47.9 mL/min (by C-G formula based on SCr of 0.97 mg/dL).   Assessment: 5 YOF with history of Afib not on anticoagulation PTA due to history of GIB.  Patient admitted s/p cardiac arrest, then developed an acute DVT and was started on IV heparin.  She subsequently had a RLE hematoma and heparin was held.  Hematoma improving.  Heparin to be transitioned to Lovenox today   Goal of Therapy:  Monitor platelets by anticoagulation protocol: Yes   Plan:  DC heparin Lovenox 60 mg sq Q 12 hours Follow CBC  Thank you Anette Guarneri, PharmD [phone]

## 2018-11-30 NOTE — Consult Note (Signed)
Ref: Nolene Ebbs, MD   Subjective:  Awake and afebrile. Poor oral intake. She has mild hypokalemia. Hypernatremia resolved.  Needs encouragement with feeding.  Will try Lovenox injections for VTE treatment to prepare her for SNF placement.  Monitor shows sinus rhythm in 50's with very small dose of metoprolol and amiodarone.  Hgb is 8.0 gm and stable. Platelets counts are normal. BUN/Cr is near normal. Appreciate PT evaluation and treatment.  Objective:  Vital Signs in the last 24 hours: Temp:  [97.5 F (36.4 C)-98.7 F (37.1 C)] 98.7 F (37.1 C) (02/11 0822) Pulse Rate:  [46-61] 57 (02/11 0822) Cardiac Rhythm: Sinus bradycardia (02/11 0700) Resp:  [12-14] 14 (02/11 0822) BP: (112-156)/(57-73) 156/70 (02/11 0822) SpO2:  [96 %-99 %] 96 % (02/11 0822) Weight:  [67.7 kg] 67.7 kg (02/11 0354)  Physical Exam: BP Readings from Last 1 Encounters:  11/30/18 (!) 156/70     Wt Readings from Last 1 Encounters:  11/30/18 67.7 kg    Weight change: 1 kg Body mass index is 24.84 kg/m. HEENT: [place]/AT, Eyes-Brown, PERL, EOMI, Conjunctiva-Pale, Sclera-Non-icteric Neck: No JVD, No bruit, Trachea midline. Lungs:  Clear, Bilateral. Cardiac:  Regular rhythm, normal S1 and S2, no S3. II/VI systolic murmur. Abdomen:  Soft, non-tender. BS present. Extremities:  Tenderness of right thigh and below the knee area. No edema present. No cyanosis. No clubbing. CNS: AxOx2, Cranial nerves grossly intact, moves all 4 extremities. Not able to ambulate independently even with walker. Skin: Warm and dry.   Intake/Output from previous day: 02/10 0701 - 02/11 0700 In: 1300 [P.O.:310; I.V.:990] Out: 350 [Urine:350]    Lab Results: BMET    Component Value Date/Time   NA 144 11/30/2018 0355   NA 147 (H) 11/29/2018 0642   NA 144 11/27/2018 0359   NA 144 09/17/2017 1429   NA 141 07/30/2017 1504   NA 142 06/11/2017 1451   K 3.4 (L) 11/30/2018 0355   K 4.0 11/29/2018 0642   K 3.3 (L) 11/26/2018 0359    K 3.1 (L) 09/17/2017 1429   K 4.8 07/30/2017 1504   K 4.3 06/11/2017 1451   CL 112 (H) 11/30/2018 0355   CL 111 11/29/2018 0642   CL 108 11/29/2018 0359   CO2 24 11/30/2018 0355   CO2 23 11/29/2018 0642   CO2 26 12/05/2018 0359   CO2 23 09/17/2017 1429   CO2 20 (L) 07/30/2017 1504   CO2 20 (L) 06/11/2017 1451   GLUCOSE 92 11/30/2018 0355   GLUCOSE 80 11/29/2018 0642   GLUCOSE 88 12/06/2018 0359   GLUCOSE 96 09/17/2017 1429   GLUCOSE 79 07/30/2017 1504   GLUCOSE 74 06/11/2017 1451   BUN 8 11/30/2018 0355   BUN 10 11/29/2018 0642   BUN 12 12/01/2018 0359   BUN 15.6 09/17/2017 1429   BUN 24.4 07/30/2017 1504   BUN 18.7 06/11/2017 1451   CREATININE 0.97 11/30/2018 0355   CREATININE 1.04 (H) 11/29/2018 0642   CREATININE 1.14 (H) 11/21/2018 0359   CREATININE 1.97 (H) 10/21/2018 1055   CREATININE 1.36 (H) 10/07/2018 0823   CREATININE 1.41 (H) 09/30/2018 1005   CREATININE 1.0 09/17/2017 1429   CREATININE 1.2 (H) 07/30/2017 1504   CREATININE 1.2 (H) 06/11/2017 1451   CALCIUM 8.3 (L) 11/30/2018 0355   CALCIUM 8.1 (L) 11/29/2018 0642   CALCIUM 7.7 (L) 12/16/2018 0359   CALCIUM 8.6 09/17/2017 1429   CALCIUM 9.1 07/30/2017 1504   CALCIUM 9.6 06/11/2017 1451   GFRNONAA 59 (L) 11/30/2018 0355  GFRNONAA 54 (L) 11/29/2018 0642   GFRNONAA 48 (L) 11/25/2018 0359   GFRNONAA 25 (L) 10/21/2018 1055   GFRNONAA 39 (L) 10/07/2018 0823   GFRNONAA 37 (L) 09/30/2018 1005   GFRAA >60 11/30/2018 0355   GFRAA >60 11/29/2018 0642   GFRAA 56 (L) 11/27/2018 0359   GFRAA 29 (L) 10/21/2018 1055   GFRAA 45 (L) 10/07/2018 0823   GFRAA 43 (L) 09/30/2018 1005   CBC    Component Value Date/Time   WBC 6.5 11/30/2018 0355   RBC 2.80 (L) 11/30/2018 0355   HGB 8.0 (L) 11/30/2018 0355   HGB 9.1 (L) 10/21/2018 1055   HGB 11.3 (L) 09/17/2017 1428   HCT 27.3 (L) 11/30/2018 0355   HCT 35.8 09/17/2017 1428   PLT 210 11/30/2018 0355   PLT 271 10/21/2018 1055   PLT 231 09/17/2017 1428   MCV 97.5  11/30/2018 0355   MCV 93.2 09/17/2017 1428   MCH 28.6 11/30/2018 0355   MCHC 29.3 (L) 11/30/2018 0355   RDW 18.4 (H) 11/30/2018 0355   RDW 16.5 (H) 09/17/2017 1428   LYMPHSABS 0.6 (L) 11/14/2018 0400   LYMPHSABS 0.7 (L) 09/17/2017 1428   MONOABS 1.8 (H) 11/14/2018 0400   MONOABS 0.5 09/17/2017 1428   EOSABS 0.0 11/14/2018 0400   EOSABS 0.1 09/17/2017 1428   BASOSABS 0.1 11/14/2018 0400   BASOSABS 0.0 09/17/2017 1428   HEPATIC Function Panel Recent Labs    11/14/18 0400 11/15/18 0931 11/21/18 0430  PROT 5.2* 5.3* 5.0*   HEMOGLOBIN A1C No components found for: HGA1C,  MPG CARDIAC ENZYMES Lab Results  Component Value Date   CKTOTAL 52 08/21/2017   CKMB 1.3 08/21/2017   TROPONINI 0.94 (HH) 11/13/2018   TROPONINI 0.92 (HH) 11/13/2018   TROPONINI 0.80 (HH) 11/12/2018   BNP No results for input(s): PROBNP in the last 8760 hours. TSH Recent Labs    12/13/17 2243 10/02/18 1825  TSH 1.922 0.751   CHOLESTEROL Recent Labs    12/14/17 0457 09/02/18 0152 10/03/18 0637  CHOL 167 143 136    Scheduled Meds: . amiodarone  100 mg Per Tube Daily  . atorvastatin  40 mg Per Tube q1800  . budesonide (PULMICORT) nebulizer solution  0.25 mg Nebulization BID  . chlorhexidine  15 mL Mouth Rinse BID  . Chlorhexidine Gluconate Cloth  6 each Topical Daily  . feeding supplement (ENSURE ENLIVE)  237 mL Oral TID BM  . feeding supplement (PRO-STAT SUGAR FREE 64)  30 mL Oral BID  . fluconazole  100 mg Oral Daily  . ipratropium-albuterol  3 mL Nebulization BID  . mouth rinse  15 mL Mouth Rinse q12n4p  . metoprolol tartrate  12.5 mg Oral Daily  . Potassium Chloride CR  8 mEq Oral BID  . pyridostigmine  30 mg Per Tube BID  . sodium chloride flush  10-40 mL Intracatheter Q12H   Continuous Infusions: . dextrose 5 % and 0.45% NaCl 40 mL/hr at 11/30/18 0651  . heparin 700 Units/hr (11/30/18 0651)   PRN Meds:.oxyCODONE, sodium chloride flush, traMADol  Assessment/Plan: V. Fib cardiac  arrest Hypoxic encephalopathy Gait abnormality Generalized weakness S/P acute respiratory failure Acute on chronic left systolic heart failure, compensated CKD, II CAD S/P stents in LAD and LCx Multiple myeloma with multiple sketal abnormalities Sinus bradycardia, stable Right leg acute DVT Right leg pain with small superficial hematoma  Increase activity. Potassium as smaller capsule, if unable to swallow will go back to liquid potassium Change IV heparin to Lovenox  injection (like 60 mg. SQ bid) and consider Apixaban 2.5 mg. To 5 mg. bid depending on renal function and GI bleed risk after 3 weeks of Lovenox injections.   LOS: 18 days    [name]  MD  11/30/2018, 8:39 AM

## 2018-11-30 NOTE — Progress Notes (Addendum)
PROGRESS NOTE    Abigail [name]  GGE:[id number] DOB: 1945-12-18 DOA: 11/12/2018 PCP: Abigail Ebbs, MD   Brief Narrative: Abigail [name] is a 73 y.o. womanwith a hx ofmyasthenia gravis, atrial fibrillation, prolonged QTC, CKD,multiple myeloma, and generalized FTT. Patient presented secondary to VT/VF arrest x2. Defibrillated 3 times. Intubated prior to arrival to hospital. NG tube out and working on nutrition/physical therapy.    Assessment & Plan:   Active Problems:   Encounter for central line placement   Cardiac arrest with ventricular fibrillation (HCC)   Acute respiratory failure with hypoxia (HCC)   [name] (acute kidney injury) ([place])   Advanced care planning/counseling discussion   Goals of care, counseling/discussion   Palliative care by specialist   Thrush   Closed fracture of multiple ribs   Pneumonia   V-tach/V-fib arrest Evaluated by EP and not a candidate for an AICD. Started on amiodarone. Metoprolol added.  Tachy-brady syndrome EP consulted. Initially on amiodarone IV which was discontinued secondary to hypotension. Has been stable on amiodarone PO. -Cardiology recommendations: amiodarone, metoprolol 12.5 started 2/10  Acute respiratory failure with hypoxia Resolved. Procalcitonin in range not recommending initiation of antibiotics. Likely atelectasis related -Encourage incentive spirometer use  Hypoxic encephalopathy Improved.  Hypokalemia -Supplementation as needed  Depression Affecting appetite -Start remeron  Moderate malnutrition NG tube removed 11/25/2018. Poor motivation to eat. Poor oral intake. Discussed concern for more permanent feeding tube if patient's intake does not improve. -Encourage oral intake; continue dysphagia diet and advance as patient tolerates -Nutrition recommendations: Ensure TID, pro-stat BID  Multiple myeloma Diffuse lytic bone lesions. Last received Zometa in December. Chemo on hold  Myasthenia gravis -Continue  pyridostigmine  Right lower leg hematoma Appears resolved.  Acute RLE DVT Currently on heparin drip. Hemoglobin stable. -Continue Heparin  Macrocytic anemia Acute on chronic. Currently stable. On heparin.  Acute kidney injury on CKD III Baseline creatinine of 1.4. Peak creatinine of 1.86. improved. Currently at baseline.  Hypernatremia Likely secondary to poor oral hydration. Resolved at this time -Encourage oral intake -Repeat BMP  CAD Cardiology on board. S/p PTCA to LAD/LCx in past. No cath performed this admission.   Generalized pain -Continue Oxycodone, tramadol, morphine prn  Abdominal pain Persistent. Previously evaluated and found to have non-specific colitis which has improved on repeat CT scan  Atelectasis -Encourage incentive spirometer use   DVT prophylaxis: SCDs Code Status:   Code Status: DNR Family Communication: None at bedside Disposition Plan: Discharge likely to SNF when medically more stable (eating by mouth, keep well hydrated with IV intervention)   Consultants:   Cardiology  Palliative care  Procedures:   None  Antimicrobials:  Cefazolin    Subjective: No concern today. Still has right leg pain  Objective: Vitals:   11/30/18 0354 11/30/18 0822 11/30/18 0854 11/30/18 1236  BP: (!) 155/73 (!) 156/70 (!) 156/70 (!) 147/66  Pulse: (!) 55 (!) 57 (!) 56 (!) 51  Resp:  _0 Temp: 98 F (36.7 C) 98.7 F (37.1 C)  98.6 F (37 C)  TempSrc: Oral Oral  Oral  SpO2: 97% 96% 96% 93%  Weight: 67.7 kg     Height:        Intake/Output Summary (Last 24 hours) at 11/30/2018 1417 Last data filed at 11/30/2018 1236 Gross per 24 hour  Intake 1172.92 ml  Output 350 ml  Net 822.92 ml   Filed Weights   12/08/2018 0535 11/29/18 0444 11/30/18 0354  Weight: 68.5 kg 66.7 kg 67.7 kg  Examination:  General exam: Appears calm and comfortable Respiratory system: Diminished. Respiratory effort normal. Cardiovascular system: S1 & S2  heard, RRR. No murmurs, rubs, gallops or clicks. Gastrointestinal system: Abdomen is nondistended, soft and nontender. No organomegaly or masses felt. Normal bowel sounds heard. Central nervous system: Alert and oriented. No focal neurological deficits. Extremities: Right LE edema. No calf tenderness Skin: No cyanosis. No rashes Psychiatry: Judgement and insight appear normal. Depressed mood and flat affect     Data Reviewed: I have personally reviewed following labs and imaging studies  CBC: Recent Labs  Lab 11/26/18 0355 11/27/18 0436 11/20/2018 0359 11/29/18 0642 11/30/18 0355  WBC 7.6 5.9 6.4 6.1 6.5  HGB 8.0* 7.7* 7.8* 8.0* 8.0*  HCT 25.9* 25.5* 26.0* 27.4* 27.3*  MCV 94.9 96.2 98.9 97.2 97.5  PLT 167 180 185 205 916   Basic Metabolic Panel: Recent Labs  Lab 11/24/18 0500 11/27/18 0436 12/10/2018 0359 11/29/18 0642 11/30/18 0355  NA 142 147* 144 147* 144  K 3.7 3.5 3.3* 4.0 3.4*  CL 107 108 108 111 112*  CO2 _0 GLUCOSE 95 81 88 80 92  BUN 26* _1 CREATININE 1.34* 1.35* 1.14* 1.04* 0.97  CALCIUM 7.9* 7.8* 7.7* 8.1* 8.3*  PHOS 3.6  --   --   --   --    GFR: Estimated Creatinine Clearance: 47.9 mL/min (by C-G formula based on SCr of 0.97 mg/dL). Liver Function Tests: Recent Labs  Lab 11/24/18 0500  ALBUMIN 2.0*   No results for input(s): LIPASE, AMYLASE in the last 168 hours. No results for input(s): AMMONIA in the last 168 hours. Coagulation Profile: No results for input(s): INR, PROTIME in the last 168 hours. Cardiac Enzymes: No results for input(s): CKTOTAL, CKMB, CKMBINDEX, TROPONINI in the last 168 hours. BNP (last 3 results) No results for input(s): PROBNP in the last 8760 hours. HbA1C: No results for input(s): HGBA1C in the last 72 hours. CBG: No results for input(s): GLUCAP in the last 168 hours. Lipid Profile: No results for input(s): CHOL, HDL, LDLCALC, TRIG, CHOLHDL, LDLDIRECT in the last 72 hours. Thyroid Function  Tests: No results for input(s): TSH, T4TOTAL, FREET4, T3FREE, THYROIDAB in the last 72 hours. Anemia Panel: No results for input(s): VITAMINB12, FOLATE, FERRITIN, TIBC, IRON, RETICCTPCT in the last 72 hours. Sepsis Labs: Recent Labs  Lab 11/25/18 1131  PROCALCITON 0.20    No results found for this or any previous visit (from the past 240 hour(s)).       Radiology Studies: Ct Abdomen Pelvis Wo Contrast  Result Date: 12/06/2018 CLINICAL DATA:  Abdominal pain and decreased oral intake, history of multiple myeloma EXAM: CT ABDOMEN AND PELVIS WITHOUT CONTRAST TECHNIQUE: Multidetector CT imaging of the abdomen and pelvis was performed following the standard protocol without IV contrast. COMPARISON:  10/22/2018 FINDINGS: Lower chest: Bilateral pleural effusions are noted small on the left and moderate on the right. Some associated lower lobe consolidation/atelectasis is noted increased from the prior exam. Hepatobiliary: Liver is within normal limits. There is dependent density within the gallbladder likely related to vicarious excretion of contrast material. Previous CT did not demonstrate any evidence of cholelithiasis. Pancreas: Unremarkable. No pancreatic ductal dilatation or surrounding inflammatory changes. Spleen: Normal in size without focal abnormality. Accessory splenules are noted. Adrenals/Urinary Tract: Adrenal glands are within normal limits. Kidneys are well visualized bilaterally. No calculi or obstructive changes are seen. Hypo and hyperdense left renal cysts are again identified and stable from  the recent exam. Bilateral renal artery stents are noted. Bladder is decompressed Stomach/Bowel: Scattered diverticular changes noted within the colon. The previously seen wall thickening in the descending colon has resolved in the interval from the prior exam. No findings to suggest diverticulitis are seen. No obstructive or inflammatory changes of the large or small bowel are noted. The  appendix is not well visualized similar to that seen on the prior study. Vascular/Lymphatic: Aortic atherosclerosis. No enlarged abdominal or pelvic lymph nodes. Reproductive: Status post hysterectomy. No adnexal masses. Other: No abdominal wall hernia or abnormality. No abdominopelvic ascites. Musculoskeletal: Degenerative changes of the lumbar spine are seen stable from the prior exam. No acute bony abnormality is noted. Stable myelomatous changes at T12 are seen. IMPRESSION: New bilateral lower lobe consolidation/atelectasis with associated effusions right greater than left. Resolution of previously seen descending and sigmoid colon colitis. Chronic changes as described above without acute abnormality. Electronically Signed   By: Inez Catalina M.D.   On: 12/10/2018 19:37        Scheduled Meds: . amiodarone  100 mg Per Tube Daily  . atorvastatin  40 mg Per Tube q1800  . budesonide (PULMICORT) nebulizer solution  0.25 mg Nebulization BID  . chlorhexidine  15 mL Mouth Rinse BID  . Chlorhexidine Gluconate Cloth  6 each Topical Daily  . enoxaparin (LOVENOX) injection  60 mg Subcutaneous Q12H  . feeding supplement (ENSURE ENLIVE)  237 mL Oral TID BM  . feeding supplement (PRO-STAT SUGAR FREE 64)  30 mL Oral BID  . fluconazole  100 mg Oral Daily  . ipratropium-albuterol  3 mL Nebulization BID  . mouth rinse  15 mL Mouth Rinse q12n4p  . metoprolol tartrate  12.5 mg Oral Daily  . mirtazapine  15 mg Oral QHS  . Potassium Chloride CR  8 mEq Oral BID  . pyridostigmine  30 mg Per Tube BID  . sodium chloride flush  10-40 mL Intracatheter Q12H   Continuous Infusions:    LOS: 18 days     Cordelia Poche, MD [facility] 11/30/2018, 2:17 PM  If 7PM-7AM, please contact night-coverage [email or web address]

## 2018-11-30 NOTE — Progress Notes (Signed)
Palliative:  Abigail [name] is sitting up in chair today. She opens her eyes and smiles at me and then closes her eyes. She does not her head yes/no to my questions. She has breakfast and lunch trays untouched at her bedside. NT tells me that multiple people have attempted to offer her food/drink but she has only accepted 1 bite and very little fluids as well. I was unable to obtain any further insight from Abigail [name].  I was able to reach her sister, Abigail [name], today after unable to reach her yesterday. I explained to Abigail [name] my concern that [name] continues with extremely poor intake. Abigail [name] understands and is concerned. She understands that her sister will be unable to participate in rehab with poor intake. She confirms that they do NOT want artificial feeding. I did explain the concern with decline after such a trauma from resuscitation this admission. I discussed my concern for failure to thrive and recommendation for hospice consideration. Abigail [name] confirms that she just wants what is best for her sister. I asked Abigail [name] her thoughts - "let's see how the next few days go."  Emotional support provided.   Exam: Lethargic but arouses. Orientation questionable. No distress.   Plan: - Family struggling with decisions but open to transition for comfort and hospice. I will continue to discuss.   59 min  Vinie Sill, NP Palliative Medicine Team Pager # [phone] (M-F 8a-5p) Team Phone # [phone] (Nights/Weekends)

## 2018-11-30 NOTE — Progress Notes (Signed)
Occupational Therapy Treatment Patient Details Name: Abigail [name] MRN: [id number] DOB: 17-Jun-1946 Today's Date: 11/30/2018    History of present illness Pt is a 73 y.o. female admitted 11/12/18 with VT/VF arrest, overall time to ROSC estimated 12 minutes; was noted to be posturing with R-side gaze preference in the ED. ETT 1/24-1/27. Also with RLE DVT. PMH Includes CKD III, myasthenia gravis, multiple myeloma, failure to thrive, recent admission for infectious colitis/septic shock.    OT comments  This 73 yo female remains at Mod A for OOB and Mod A for transfers, she was able to wash face and then use wipe to clean hands getting ready for lunch. Pt would not self feed--"I don't have an appetite" . Attempted to help her eat and she only ate one bite of pudding and then did not want any more. Pt will continue to benefit from acute OT with follow up at SNF--but needs nutrition to get stronger (discussed this with RN).   Follow Up Recommendations  SNF;Supervision/Assistance - 24 hour    Equipment Recommendations  Other (comment)(TBD next venue)       Precautions / Restrictions Precautions Precautions: Fall Precaution Comments: pain with RLE movement; incontinence Restrictions Weight Bearing Restrictions: No       Mobility Bed Mobility Overal bed mobility: Needs Assistance Bed Mobility: Supine to Sit     Supine to sit: Mod assist;HOB elevated     General bed mobility comments: increased time and cues  Transfers Overall transfer level: Needs assistance   Transfers: Sit to/from Stand;Stand Pivot Transfers Sit to Stand: Mod assist Stand pivot transfers: Mod assist(bed>recliner)            Balance Overall balance assessment: Needs assistance Sitting-balance support: No upper extremity supported;Feet supported Sitting balance-Leahy Scale: Fair     Standing balance support: During functional activity;Bilateral upper extremity supported Standing balance-Leahy Scale:  Poor Standing balance comment: Requires external support                           ADL either performed or assessed with clinical judgement   ADL Overall ADL's : Needs assistance/impaired Eating/Feeding: Total assistance;Sitting Eating/Feeding Details (indicate cue type and reason): Pt does not want to eat "I don't have an appetite"  so will not self feed Grooming: Wash/dry face;Set up;Supervision/safety;Sitting Grooming Details (indicate cue type and reason): recliner                 Toilet Transfer: Moderate assistance;Stand-pivot Toilet Transfer Details (indicate cue type and reason): bed>recliner                 Vision Patient Visual Report: No change from baseline            Cognition Arousal/Alertness: Awake/alert Behavior During Therapy: Flat affect Overall Cognitive Status: No family/caregiver present to determine baseline cognitive functioning Area of Impairment: Attention;Following commands;Safety/judgement;Problem solving                   Current Attention Level: Sustained   Following Commands: Follows one step commands inconsistently;Follows one step commands with increased time Safety/Judgement: Decreased awareness of safety;Decreased awareness of deficits   Problem Solving: Requires verbal cues;Decreased initiation;Slow processing;Difficulty sequencing;Requires tactile cues                     Pertinent Vitals/ Pain       Pain Assessment: Faces Faces Pain Scale: Hurts a little bit Pain Location: RLE with movement Pain  Descriptors / Indicators: Guarding;Sore Pain Intervention(s): Limited activity within patient's tolerance;Repositioned         Frequency  Min 2X/week        Progress Toward Goals  OT Goals(current goals can now be found in the care plan section)  Progress towards OT goals: Progressing toward goals     Plan Discharge plan remains appropriate       AM-PAC OT "6 Clicks" Daily Activity      Outcome Measure   Help from another person eating meals?: Total Help from another person taking care of personal grooming?: A Lot Help from another person toileting, which includes using toliet, bedpan, or urinal?: A Lot Help from another person bathing (including washing, rinsing, drying)?: Total Help from another person to put on and taking off regular upper body clothing?: Total Help from another person to put on and taking off regular lower body clothing?: Total 6 Click Score: 8    End of Session Equipment Utilized During Treatment: Gait belt  OT Visit Diagnosis: Pain;Muscle weakness (generalized) (M62.81);Other symptoms and signs involving cognitive function Pain - Right/Left: Right Pain - part of body: Leg   Activity Tolerance Patient tolerated treatment well   Patient Left in chair;with call bell/phone within reach;with chair alarm set   Nurse Communication (pt only ate one bite of chocolate pudding and would not eat anything else--talked to RN about speaking with MD about appetite stimulant)        Time: 8119-1478 OT Time Calculation (min): 21 min  Charges: OT General Charges $OT Visit: 1 Visit OT Treatments $Self Care/Home Management : 8-22 mins  [place] Circle, OTR/L Acute [employer] Pager [phone] Office [phone]      Almon Register 11/30/2018, 2:15 PM

## 2018-11-30 NOTE — Care Management Important Message (Signed)
Important Message  Patient Details  Name: Abigail [name] MRN: [id number] Date of Birth: Jan 22, 1946   Medicare Important Message Given:  Yes    [name] Doral Digangi 11/30/2018, 4:47 PM

## 2018-11-30 NOTE — Social Work (Signed)
CSW spoke to patient's sister, [name] on the phone. [name] has reviewed SNF bed offers and chosen [facility]. [name] was not aware of another palliative meeting today.  CSW started SNF authorization request with patient's insurance. Will follow for medical readiness/feeding plan/insurance approval and support with discharge planning.  Estanislado Emms, LCSW [phone]

## 2018-12-01 LAB — IGG: IgG (Immunoglobin G), Serum: 1099 mg/dL (ref 700–1600)

## 2018-12-01 LAB — PROTEIN ELECTROPHORESIS, SERUM
A/G Ratio: 0.9 (ref 0.7–1.7)
ALPHA-2-GLOBULIN: 0.9 g/dL (ref 0.4–1.0)
Albumin ELP: 2.7 g/dL — ABNORMAL LOW (ref 2.9–4.4)
Alpha-1-Globulin: 0.4 g/dL (ref 0.0–0.4)
Beta Globulin: 0.7 g/dL (ref 0.7–1.3)
Gamma Globulin: 0.9 g/dL (ref 0.4–1.8)
Globulin, Total: 2.9 g/dL (ref 2.2–3.9)
M-Spike, %: 0.7 g/dL — ABNORMAL HIGH
Total Protein ELP: 5.6 g/dL — ABNORMAL LOW (ref 6.0–8.5)

## 2018-12-01 LAB — BASIC METABOLIC PANEL
Anion gap: 11 (ref 5–15)
BUN: 9 mg/dL (ref 8–23)
CO2: 22 mmol/L (ref 22–32)
Calcium: 8.7 mg/dL — ABNORMAL LOW (ref 8.9–10.3)
Chloride: 111 mmol/L (ref 98–111)
Creatinine, Ser: 0.95 mg/dL (ref 0.44–1.00)
GFR calc non Af Amer: >60 mL/min (ref >60)
Glucose, Bld: 87 mg/dL (ref 70–99)
Potassium: 3.3 mmol/L — ABNORMAL LOW (ref 3.5–5.1)
Sodium: 144 mmol/L (ref 135–145)

## 2018-12-01 LAB — KAPPA/LAMBDA LIGHT CHAINS
Kappa free light chain: 15.5 mg/L (ref 3.3–19.4)
Kappa, lambda light chain ratio: 0.3 (ref 0.26–1.65)
Lambda free light chains: 52 mg/L — ABNORMAL HIGH (ref 5.7–26.3)

## 2018-12-01 MED ORDER — POTASSIUM CHLORIDE CRYS ER 20 MEQ PO TBCR
40.0000 meq | EXTENDED_RELEASE_TABLET | Freq: Once | ORAL | Status: AC
  Administered 2018-12-01: 40 meq via ORAL
  Filled 2018-12-01: qty 2

## 2018-12-01 NOTE — Progress Notes (Addendum)
Physical Therapy Treatment Patient Details Name: Abigail [name] MRN: [id number] DOB: 07-06-1946 Today's Date: 12/01/2018    History of Present Illness Pt is a 73 y.o. female admitted 11/12/18 with VT/VF arrest, overall time to ROSC estimated 12 minutes; was noted to be posturing with R-side gaze preference in the ED. ETT 1/24-1/27. Also with RLE DVT. PMH Includes CKD III, myasthenia gravis, multiple myeloma, failure to thrive, recent admission for infectious colitis/septic shock.    PT Comments    Pt not progressing with mobility this session. Pt initially interacting with PT, answering questions minimally, but then not consistently following commands or participating when it comes time for OOB mobility; later able to clearly state request for medications due to abdominal pain (RN present). Pt required min guard to maxA for bed mobility and repositioning in order to take medications. Continue to recommend SNF-level therapies if pt able to participate; when asked if she wanted to continue working with rehab services, pt stating "yes" although participating minimally this session.    Follow Up Recommendations  SNF;Supervision/Assistance - 24 hour(vs Hospice)     Equipment Recommendations  None recommended by PT    Recommendations for Other Services       Precautions / Restrictions Precautions Precautions: Fall Restrictions Weight Bearing Restrictions: No    Mobility  Bed Mobility Overal bed mobility: Needs Assistance Bed Mobility: Rolling;Supine to Sit           General bed mobility comments: MinA to come to long sit in bed; maxA+2 to slide up in bed as pt not following commands  Transfers                    Ambulation/Gait                 Stairs             Wheelchair Mobility    Modified Rankin (Stroke Patients Only)       Balance                                            Cognition Arousal/Alertness: Awake/alert Behavior  During Therapy: Flat affect Overall Cognitive Status: No family/caregiver present to determine baseline cognitive functioning Area of Impairment: Attention;Following commands;Awareness;Problem solving                   Current Attention Level: Focused   Following Commands: Follows one step commands inconsistently   Awareness: Intellectual Problem Solving: Requires verbal cues;Decreased initiation;Slow processing;Difficulty sequencing;Requires tactile cues General Comments: Pt initially conversant responding to conversation, then stopped talking when mobility mentioned; able to clearly ask for medications due to painful abdomen, but then not responding to OOB mobility although saying "yes" when asked if she wanted to continue working with rehab services      Exercises      General Comments        Pertinent Vitals/Pain Pain Assessment: Faces Faces Pain Scale: Hurts little more Pain Location: R lower abdomen Pain Descriptors / Indicators: Guarding;Sore;Grimacing Pain Intervention(s): Limited activity within patient's tolerance;Patient requesting pain meds-RN notified    Home Living                      Prior Function            PT Goals (current goals can now be found in the care plan  section) Acute Rehab PT Goals Patient Stated Goal: unable to state PT Goal Formulation: Patient unable to participate in goal setting Time For Goal Achievement: 12/01/18 Potential to Achieve Goals: Fair Progress towards PT goals: Not progressing toward goals - comment(Limited participation/cognition/motivation(?))    Frequency    Min 2X/week      PT Plan Current plan remains appropriate    Co-evaluation              AM-PAC PT "6 Clicks" Mobility   Outcome Measure  Help needed turning from your back to your side while in a flat bed without using bedrails?: A Lot Help needed moving from lying on your back to sitting on the side of a flat bed without using  bedrails?: A Lot Help needed moving to and from a bed to a chair (including a wheelchair)?: Total Help needed standing up from a chair using your arms (e.g., wheelchair or bedside chair)?: Total Help needed to walk in hospital room?: Total Help needed climbing 3-5 steps with a railing? : Total 6 Click Score: 8    End of Session   Activity Tolerance: Other (comment)(limited by cognition) Patient left: in bed;with call bell/phone within reach;with bed alarm set;with nursing/sitter in room Nurse Communication: Mobility status PT Visit Diagnosis: Muscle weakness (generalized) (M62.81);Unsteadiness on feet (R26.81);Difficulty in walking, not elsewhere classified (R26.2)     Time: 4403-4742 PT Time Calculation (min) (ACUTE ONLY): 18 min  Charges:  $Therapeutic Activity: 8-22 mins                    Mabeline Caras, PT, DPT Acute Rehabilitation Services  Pager [phone] Office [place] 12/01/2018, 5:16 PM

## 2018-12-01 NOTE — Progress Notes (Signed)
In interacting with patient, she has made it clear to me, both verbally and non-verbally, that she desires pain medication, but does not want to eat or take other medications.  I attempted to feed her her meals today, but was not successful.  She only states that she "wants to rest".  I asked patient if she understood what would happen if she continued not to eat or drink.  She replied, "I know I'll die."  She does request pain medication, but that is it.  She also reports feelings of occasional nausea.  The oxycodone that is available to her appears to help her feel more comfortable and in less distress, but she clearly appears to know what is physically happening to her.

## 2018-12-01 NOTE — Social Work (Signed)
Discussed disposition planning with MD and palliative NP. Per palliative, patient continues to have poor PO intake, unlikely to participate meaningfully in rehab. Family is considering hospice, palliative awaiting return call from family to discuss.   Patient does have insurance approval for SNF, but CSW will follow for final decision on hospice and continue to support with disposition.  Estanislado Emms, LCSW [phone]

## 2018-12-01 NOTE — Progress Notes (Signed)
Palliative:  I met again today with Ms. Kasparian. She is more sleepy and does not interact with me much today. I asked her multiple questions but she really only answered that she did not want her window curtain open. Per RN she has continued to refuse all intake except a few sips of liquid.   I called and spoke with her sister, Tonia Ghent (I spoke with sister [name] yesterday). I explained that she continues not eating or even drinking much. I explained some of the conversations she has had with myself and nursing over the past few days. RN reports that she asked Ms. Geron if she knew what would happen if she does not eat and she tells her that she dies. Tonia Ghent seemed to understand and agree at this time that Ms. Alleyne is ready to die. Tonia Ghent agrees that we should keep her comfortable and utilize hospice care. Tonia Ghent also says that she will call and speak with sister, [name], about comfort care and hospice. I will reach back to her tomorrow.   Exam: Lethargic. Orientation questionable. Refusing intake. No distress.   Plan: - Sisters discussing transition to hospice care.   31 min  Vinie Sill, NP Palliative Medicine Team Pager # [phone] (M-F 8a-5p) Team Phone # [phone] (Nights/Weekends)

## 2018-12-01 NOTE — Progress Notes (Signed)
PROGRESS NOTE    Abigail [name]  IRC:[id number] DOB: 1946/03/18 DOA: 11/12/2018 PCP: Nolene Ebbs, MD   Brief Narrative: Patient is a 73 year old female with history of myasthenia gravis, atrial fibrillation, CKD, multiple myeloma, failure to thrive who presented initially for VT/V. fib arrest x2.  Defibrillated 3 times.  Intubated prior to arrival to the hospital.  Overall status has improved but she continues to have poor oral intake.  NG tube has been out.  Remains weak.  Physical therapy recommended skilled nursing facility on discharge.  Palliative care consulted for discussion of goals of care.  Assessment & Plan:   Active Problems:   Encounter for central line placement   Cardiac arrest with ventricular fibrillation (HCC)   Acute respiratory failure with hypoxia (HCC)   AKI (acute kidney injury) ([place])   Advanced care planning/counseling discussion   Goals of care, counseling/discussion   Palliative care by specialist   Thrush   Closed fracture of multiple ribs   Pneumonia   Poor appetite  V. tach/V. fib arrest/tachy-brady syndrome: Patient evaluated by EP and she is not a candidate for AICD.  Started on amiodarone.  Also on metoprolol.  Currently heart rate is controlled.  Acute respiratory failure with hypoxia: Resolved.  Currently saturating fine on room air.  Hypoxic encephalopathy: Improved.  Currently she is alert and oriented to place and person.  Communicates and obeys commands.  Hypokalemia: Supplemented.  Poor oral intake: NG tube removed on 11/25/2018.  Continues to have poor oral intake.  Ate her breakfast today.  There was plan for tube feeding initiation but family against artificial feeding.  Palliative care following for discussion of goals of care.  Recommending hospice.  Family wants to wait 1-2 days anticipating improvement.  Myasthenia gravis: Continue pyridostigmine  History of multiple myeloma: Has diffuse lytic bone lesions.  She received Zometa last  December.  Chemo on hold.  Acute right lower extremity DVT: Started on heparin drip and currently on Lovenox.  Will change to Eliquis on discharge if not going to hospice. She was also found to have right lower leg hematoma which has resolved.  Acute on chronic CKD stage III: Currently on baseline.  Microcytic anemia: Acute on chronic.  Currently stable.  Coronary artery disease: Cardiology was following.  History of PTCA to LAD/LCx in the past.  Depression: Continue mirtazapine.  Abdominal pain: Complains of generalized abdominal pain.  Found to have nonspecific colitis which is improved on repeat CT scan.  Continue supportive care.  Debility/deconditioning/generalized weakness: PT evaluated the patient and recommended skilled nursing facility on discharge.  Currently waiting for final decision from family regarding hospice.  If not she will be discharged to skilled facility.     Nutrition Problem: Moderate Malnutrition Etiology: chronic illness      DVT prophylaxis: SCD Code Status:DNR  Family Communication: None present at the bedside Disposition Plan: Skilled facility versus hospice   Consultants: Cardiology, palliative care  Procedures: Intubation/extubation  Antimicrobials:  Anti-infectives (From admission, onward)   Start     Dose/Rate Route Frequency Ordered Stop   11/24/18 1630  fluconazole (DIFLUCAN) tablet 100 mg     100 mg Oral Daily 11/24/18 1629 12/01/18 2359   11/19/18 1400  ceFAZolin (ANCEF) IVPB 1 g/50 mL premix     1 g 100 mL/hr over 30 Minutes Intravenous Every 8 hours 11/19/18 1002 11/21/18 0617   11/19/18 1000  ceFAZolin (ANCEF) injection 1 g  Status:  Discontinued     1 g Intramuscular Every  12 hours 11/19/18 0954 11/19/18 1000      Subjective:  Patient seen and examined at bedside this morning.  Hemodynamically stable.  Looks comfortable but weak.  Communicates.  Sitting on the chair.  Says that she ate her breakfast  today. Objective: Vitals:   11/30/18 1931 11/30/18 1944 11/30/18 2348 12/01/18 0404  BP:  (!) 159/66 (!) 174/69 (!) 141/67  Pulse:  (!) 50 (!) 53 (!) 58  Resp:      Temp:  97.8 F (36.6 C) 97.6 F (36.4 C) (!) 97.5 F (36.4 C)  TempSrc:  Oral Oral Oral  SpO2: 95% 97% 96% 92%  Weight:      Height:        Intake/Output Summary (Last 24 hours) at 12/01/2018 3254 Last data filed at 11/30/2018 2230 Gross per 24 hour  Intake 139.6 ml  Output -  Net 139.6 ml   Filed Weights   11/27/2018 0535 11/29/18 0444 11/30/18 0354  Weight: 68.5 kg 66.7 kg 67.7 kg    Examination:  General exam: Appears calm and comfortable ,Not in distress,average built,elderly female, generalized weakness HEENT:PERRL,Oral mucosa moist, Ear/Nose normal on gross exam Respiratory system: Bilateral decreased air entry on the bases Cardiovascular system: S1 & S2 heard, RRR. No JVD, murmurs, rubs, gallops or clicks. Trace pedal edema. Gastrointestinal system: Abdomen is nondistended, soft and nontender. No organomegaly or masses felt. Normal bowel sounds heard. Central nervous system: Alert and oriented. No focal neurological deficits. Extremities: Tarce edema, no clubbing ,no cyanosis, distal peripheral pulses palpable. Skin: No rashes, lesions or ulcers,no icterus ,no pallor . Data Reviewed: I have personally reviewed following labs and imaging studies  CBC: Recent Labs  Lab 11/26/18 0355 11/27/18 0436 12/17/2018 0359 11/29/18 0642 11/30/18 0355  WBC 7.6 5.9 6.4 6.1 6.5  HGB 8.0* 7.7* 7.8* 8.0* 8.0*  HCT 25.9* 25.5* 26.0* 27.4* 27.3*  MCV 94.9 96.2 98.9 97.2 97.5  PLT 167 180 185 205 982   Basic Metabolic Panel: Recent Labs  Lab 11/27/18 0436 12/10/2018 0359 11/29/18 0642 11/30/18 0355 12/01/18 0402  NA 147* 144 147* 144 144  K 3.5 3.3* 4.0 3.4* 3.3*  CL 108 108 111 112* 111  CO2 '27 26 23 24 22  ' GLUCOSE 81 88 80 92 87  BUN '17 12 10 8 9  ' CREATININE 1.35* 1.14* 1.04* 0.97 0.95  CALCIUM 7.8* 7.7*  8.1* 8.3* 8.7*   GFR: Estimated Creatinine Clearance: 48.9 mL/min (by C-G formula based on SCr of 0.95 mg/dL). Liver Function Tests: No results for input(s): AST, ALT, ALKPHOS, BILITOT, PROT, ALBUMIN in the last 168 hours. No results for input(s): LIPASE, AMYLASE in the last 168 hours. No results for input(s): AMMONIA in the last 168 hours. Coagulation Profile: No results for input(s): INR, PROTIME in the last 168 hours. Cardiac Enzymes: No results for input(s): CKTOTAL, CKMB, CKMBINDEX, TROPONINI in the last 168 hours. BNP (last 3 results) No results for input(s): PROBNP in the last 8760 hours. HbA1C: No results for input(s): HGBA1C in the last 72 hours. CBG: No results for input(s): GLUCAP in the last 168 hours. Lipid Profile: No results for input(s): CHOL, HDL, LDLCALC, TRIG, CHOLHDL, LDLDIRECT in the last 72 hours. Thyroid Function Tests: No results for input(s): TSH, T4TOTAL, FREET4, T3FREE, THYROIDAB in the last 72 hours. Anemia Panel: No results for input(s): VITAMINB12, FOLATE, FERRITIN, TIBC, IRON, RETICCTPCT in the last 72 hours. Sepsis Labs: Recent Labs  Lab 11/25/18 1131  PROCALCITON 0.20    No results found for  this or any previous visit (from the past 240 hour(s)).       Radiology Studies: No results found.      Scheduled Meds: . amiodarone  100 mg Per Tube Daily  . atorvastatin  40 mg Per Tube q1800  . budesonide (PULMICORT) nebulizer solution  0.25 mg Nebulization BID  . chlorhexidine  15 mL Mouth Rinse BID  . Chlorhexidine Gluconate Cloth  6 each Topical Daily  . enoxaparin (LOVENOX) injection  60 mg Subcutaneous Q12H  . feeding supplement (ENSURE ENLIVE)  237 mL Oral TID BM  . feeding supplement (PRO-STAT SUGAR FREE 64)  30 mL Oral BID  . fluconazole  100 mg Oral Daily  . ipratropium-albuterol  3 mL Nebulization BID  . mouth rinse  15 mL Mouth Rinse q12n4p  . metoprolol tartrate  12.5 mg Oral Daily  . mirtazapine  7.5 mg Oral QHS  .  potassium chloride  40 mEq Oral Once  . pyridostigmine  30 mg Per Tube BID  . sodium chloride flush  10-40 mL Intracatheter Q12H   Continuous Infusions:   LOS: 19 days    Time spent: More than 50% of that time was spent in counseling and/or coordination of care.      Shelly Coss, MD [facility] Pager [phone]  If 7PM-7AM, please contact night-coverage [email or web address] Password [facility] 12/01/2018, 8:29 AM

## 2018-12-02 LAB — CBC WITH DIFFERENTIAL/PLATELET
Abs Immature Granulocytes: 0.16 10*3/uL — ABNORMAL HIGH (ref 0.00–0.07)
Basophils Absolute: 0 10*3/uL (ref 0.0–0.1)
Basophils Relative: 1 %
Eosinophils Absolute: 0 10*3/uL (ref 0.0–0.5)
Eosinophils Relative: 0 %
HCT: 27.3 % — ABNORMAL LOW (ref 36.0–46.0)
Hemoglobin: 8.1 g/dL — ABNORMAL LOW (ref 12.0–15.0)
Immature Granulocytes: 2 %
Lymphocytes Relative: 11 %
Lymphs Abs: 1 10*3/uL (ref 0.7–4.0)
MCH: 28.5 pg (ref 26.0–34.0)
MCHC: 29.7 g/dL — ABNORMAL LOW (ref 30.0–36.0)
MCV: 96.1 fL (ref 80.0–100.0)
Monocytes Absolute: 0.8 10*3/uL (ref 0.1–1.0)
Monocytes Relative: 10 %
Neutro Abs: 6.5 10*3/uL (ref 1.7–7.7)
Neutrophils Relative %: 76 %
Platelets: 300 10*3/uL (ref 150–400)
RBC: 2.84 MIL/uL — ABNORMAL LOW (ref 3.87–5.11)
RDW: 18.3 % — ABNORMAL HIGH (ref 11.5–15.5)
WBC: 8.5 10*3/uL (ref 4.0–10.5)
nRBC: 0 % (ref 0.0–0.2)

## 2018-12-02 LAB — BASIC METABOLIC PANEL
Anion gap: 8 (ref 5–15)
BUN: 7 mg/dL — AB (ref 8–23)
CO2: 23 mmol/L (ref 22–32)
Calcium: 8.8 mg/dL — ABNORMAL LOW (ref 8.9–10.3)
Chloride: 113 mmol/L — ABNORMAL HIGH (ref 98–111)
Creatinine, Ser: 0.93 mg/dL (ref 0.44–1.00)
GFR calc Af Amer: >60 mL/min (ref >60)
GFR calc non Af Amer: >60 mL/min (ref >60)
Glucose, Bld: 95 mg/dL (ref 70–99)
POTASSIUM: 3.3 mmol/L — AB (ref 3.5–5.1)
Sodium: 144 mmol/L (ref 135–145)

## 2018-12-02 MED ORDER — POTASSIUM CHLORIDE CRYS ER 20 MEQ PO TBCR
40.0000 meq | EXTENDED_RELEASE_TABLET | Freq: Once | ORAL | Status: AC
  Administered 2018-12-02: 40 meq via ORAL
  Filled 2018-12-02: qty 2

## 2018-12-02 NOTE — Progress Notes (Signed)
Hospice and [facility]) Hospital Liaison note.  Received request from Astrid Divine for family interest in [facility] with request for transfer on 2018/12/21. Chart reviewed and eligibility confirmed. Met with family to confirm interest and explain services. Family agreeable to transfer 12/21/2018. CSW aware.  Registration paper work completed.. Dr. Orpah Melter to assume care per family request.  Please fax discharge summary to [phone]. RN please call report to [phone]. Please arrange transport for patient to arrive before noon if possible.  Thank you,   Farrel Gordon, RN, [place] Hospital Liaison  [place] are on AMION

## 2018-12-02 NOTE — Progress Notes (Signed)
IP PROGRESS NOTE  Subjective:   Abigail [name] complains of abdominal pain.  This has been present for several months.  She is not aware of a history of myasthenia gravis.  Her family is not present this morning.  Objective: Vital signs in last 24 hours: Blood pressure (!) 189/5, pulse (!) 58, temperature 98.1 F (36.7 C), temperature source Oral, resp. rate 16, height '5\' 5"'  (1.651 m), weight 149 lb 3.7 oz (67.7 kg), SpO2 100 %.  Intake/Output from previous day: 02/12 0701 - 02/13 0700 In: 120 [P.O.:120] Out: 2075 [Urine:2075]  Physical Exam:   Abdomen: Soft, nontender, no mass Extremities: The right lower leg is larger than the left side Neurologic: Alert, follows commands   Lab Results: Recent Labs    11/30/18 0355 12/02/18 0527  WBC 6.5 8.5  HGB 8.0* 8.1*  HCT 27.3* 27.3*  PLT 210 300    BMET Recent Labs    12/01/18 0402 12/02/18 0527  NA 144 144  K 3.3* 3.3*  CL 111 113*  CO2 22 23  GLUCOSE 87 95  BUN 9 7*  CREATININE 0.95 0.93  CALCIUM 8.7* 8.8*   Serum M spike-0.7, IgG 1099, lambda light chains 52  Medications: I have reviewed the patient's current medications.  Assessment/Plan:  1. Multiple myeloma, IgG lambda, bone marrow biopsy 06/15/2016 confirmed multiple myeloma; cytogenetics by FISHshow +11, +12, 13q-  Elevated serum free lambda light chains  Lambda light chain proteinuria  Lytic bone lesions on a bone survey 06/13/2016  Initiation of weekly Velcade/Decadron 06/19/2016  Serum light chains improved 07/31/2016  Initiation of Revlimid 11/03/20172 weeks on/2 weeks off  Serum M spike and IgG significantly improved 09/25/2016  Velcade held on 10/30/2016 due to urinary retention  Treatment resumed with Revlimid (2 weeks on/2 weeks off) and weekly Decadron on 02/12/2017, treatment placed on hold May 2018 secondary to admissions with cardiac disease  05/27/2018-increased serum M spike, IgG, serum lambda light chains  Bone survey  05/31/2018-widespread myeloma without detectable progression since 06/13/2016.  Cycle 1 Cytoxan/Velcade/Decadron 06/24/2018  Cycle 2 Cytoxan/Velcade/Decadron 07/22/2018  08/09/2018 serum light chains, M spike, IgGimproved  Cycle for Cytoxan/Velcade/Decadron 09/24/2018  Serum M spike and IgG level improved to 08/2019 2. Severe anemia secondary to #1, phlebotomy, and chronic illness  3. Diffuse lytic bone lesions secondary to multiple myeloma, status post Zometa 09/18/2016(plan to continue every 3 months);most recent Zometa infusion 10/08/2017  4. History of coronary artery disease/myocardial infarction  5. History of colon polyps  6. Bilateral leg edema 09/04/2016. Negative bilateral venous Doppler 09/05/2016.  7. Mild periorbital edema 09/18/2016, question related to early stye formation, question Velcade related chalazia.Improved.  8. CT scan 10/27/2016 with a new right kidney massevaluated by urology; MRI abdomen 11/18/2016 multiple bilateral renal cysts. Most of these are simple cysts. The 2 lesions in question on the CT scan are both hemorrhagic or proteinaceous cysts. No worrisome contrast enhancement.  9. Urinary retention 10/28/2016 status post evaluation in the emergency department. Urinalysis negative for signs of infection 10/30/2016. Velcade held. Resolved.  10. Abdominal pain, nausea, vomiting.Etiology unclear. Upper endoscopy 11/18/2016 with findings of reflux esophagitis, benign appearing esophageal stenosis, small hiatal hernia and erythematous duodenopathy. CT abdomen/pelvis 11/19/2016 with no acute abnormality.  11. 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 Left lower extremity DVT May 2018.Maintainedon Coumadin.Coumadin discontinued October 2018 secondary to a supratherapeutic PT/INR and GI bleeding  13.Admission with GI bleeding October and November 2018-felt secondary to diverticular bleeding, initially while on  supratherapeutic Coumadin, colonoscopy 09/04/2017-no source of blood loss identified, superficial tear at the posterior anal mucosa without stigmata of bleeding  14. Acute creatinine elevation 3.26 on 08/05/2018 of cycle 2 day 15; confirmed on repeat 3.13 on 08/06/18; improved  15.  Admission 102-27-2019 with rapid atrial fibrillation, now maintained on amiodarone 16.  Admission 10/15/2018 with atrial fibrillation 17.  Admission 11/12/2018 with a V. fib cardiac arrest, respiratory failure, acute renal failure, and hypoxic encephalopathy 18.  Right lower extremity DVT 11/15/2018-on Lovenox  Ms. Heatherington continues to have a poor performance status following the cardiac arrest.  The markers of multiple myeloma are improved.  He does not appear to have progression of the multiple myeloma.  Abigail [name] has a history of abdominal pain for months that has not clearly been explained.  She is also not aware of a history of myasthenia gravis.  Ms. Gita Kudo and her family have met with the.  The plan for transfer to [facility] is noted.  I will plan to check on her at [facility] during the week of 12/13/2017.      LOS: 20 days   [name], MD   12/02/2018, 3:37 PM

## 2018-12-02 NOTE — Progress Notes (Signed)
Palliative:  I met today with Abigail [name]. She is again sleepy and minimally interactive. She will speak at times but not other times. I attempted to discuss with her the decision for hospice care and she does ask some questions but does not give any input in a decision. She does tell me that she wants to "go home and get better." I explained that nobody will be able to care for her at home. I explained my concern that she is not eating and therefore cannot improve. She does not respond. She continues to refuse intake except for minimal fluids. Appetite has not improved since her resuscitation.   I spoke with Abigail [name] who again confirms her feeling that hospice transition is the right decision at this time. I met at bedside with sister, Abigail [name], who also says that she does not see how Abigail [name] can improve. She also agrees with hospice care and is interested in pursuing [facility]. They do know that Abigail [name] is at end of life. We discussed minimizing medications and plan for comfort at hospice. Abigail [name] will await further information from [employer].   Exam: Mentation waxes/wanes but overall lethargy. No distress.   Plan: - Medications minimized for comfort - Transition to hospice facility  30 min  Vinie Sill, NP Palliative Medicine Team Pager # [phone] (M-F 8a-5p) Team Phone # [phone] (Nights/Weekends)

## 2018-12-02 NOTE — Social Work (Signed)
Per palliative, family is now agreeable to residential hospice at [facility]. CSW made referral to Bevely Palmer at [facility], awaiting call back after referral review and to determine bed availability. CSW will follow and support.  Estanislado Emms, LCSW [phone]

## 2018-12-02 NOTE — Progress Notes (Signed)
Hospice and [facility])  Hospital Liaison note.  Received request from Estanislado Emms, [place] for family interest in [facility]. Chart reviewed and eligibility is pending.  Spoke with family to acknowledge referral.    Please do not hesitate to call with questions.  Thank you,  Farrel Gordon, RN, [place] [facility] Liaison  [place] are on AMION

## 2018-12-02 NOTE — Progress Notes (Signed)
Nutrition Brief Note  Chart reviewed. Patient now transitioning to Hospice care. Intake is minimal. Plans for d/c to [facility] 2/14. No further nutrition interventions warranted at this time.  Please re-consult as needed.   Molli Barrows, RD, LDN, [place] Pager [phone] After Hours Pager [phone]

## 2018-12-02 NOTE — Progress Notes (Signed)
PROGRESS NOTE    Abigail [name]  IFO:[id number] DOB: [birth year] DOA: 11/12/2018 PCP: Nolene Ebbs, MD   Brief Narrative: Patient is a 73 year old female with history of myasthenia gravis, atrial fibrillation, CKD, multiple myeloma, failure to thrive who presented initially for VT/V. fib arrest x2.  Defibrillated 3 times.  Intubated prior to arrival to the hospital.  Overall status has improved but she continues to have poor oral intake.  NG tube has been out.  Remains weak.  Physical therapy recommended skilled nursing facility on discharge.  Palliative care consulted for discussion of goals of care. Current plan is to discharge her to residential hospice at [facility] as soon as bed is available.  Assessment & Plan:   Active Problems:   Encounter for central line placement   Cardiac arrest with ventricular fibrillation (HCC)   Acute respiratory failure with hypoxia (HCC)   AKI (acute kidney injury) ([place])   Advanced care planning/counseling discussion   Goals of care, counseling/discussion   Palliative care by specialist   Thrush   Closed fracture of multiple ribs   Pneumonia   Poor appetite  V. tach/V. fib arrest/tachy-brady syndrome: Patient evaluated by EP and she is not a candidate for AICD.  Started on amiodarone.  Also on metoprolol.  Currently heart rate is controlled.  Acute respiratory failure with hypoxia: Resolved.  Currently saturating fine on room air.  Hypoxic encephalopathy: Improved.  Currently she is alert and oriented to place and person.  Communicates and obeys commands.  Hypokalemia: Supplemented.  Poor oral intake: NG tube removed on 11/25/2018.  Continues to have poor oral intake.  Ate her breakfast today.  There was plan for tube feeding initiation but family against artificial feeding.  Palliative care following for discussion of goals of care.  Recommended hospice.  Family agreed on residential hospice.  Myasthenia gravis: On pyridostigmine  History of  multiple myeloma: Has diffuse lytic bone lesions.  She received Zometa last December.  Chemo on hold.  Acute right lower extremity DVT: Started on heparin drip and currently on Lovenox.  Will stop anticoagulation now because patient is being moved to residential hospice She was also found to have right lower leg hematoma which has resolved.  Acute on chronic CKD stage III: Currently on baseline.  Microcytic anemia: Acute on chronic.  Currently stable.  Coronary artery disease: Cardiology was following.  History of PTCA to LAD/LCx in the past.  Depression: Continue mirtazapine.  Abdominal pain: Complains of generalized abdominal pain.  Found to have nonspecific colitis which is improved on repeat CT scan.  Continue supportive care.  Debility/deconditioning/generalized weakness: PT evaluated the patient and recommended skilled nursing facility on discharge.Now the plan is to discharge her to residential hospice.    Nutrition Problem: Moderate Malnutrition Etiology: chronic illness      DVT prophylaxis: SCD Code Status:DNR  Family Communication: None present at the bedside Disposition Plan: Hospice as soon as possible.   Consultants: Cardiology, palliative care  Procedures: Intubation/extubation  Antimicrobials:  Anti-infectives (From admission, onward)   Start     Dose/Rate Route Frequency Ordered Stop   11/24/18 1630  fluconazole (DIFLUCAN) tablet 100 mg     100 mg Oral Daily 11/24/18 1629 12/01/18 1623   11/19/18 1400  ceFAZolin (ANCEF) IVPB 1 g/50 mL premix     1 g 100 mL/hr over 30 Minutes Intravenous Every 8 hours 11/19/18 1002 11/21/18 0617   11/19/18 1000  ceFAZolin (ANCEF) injection 1 g  Status:  Discontinued  1 g Intramuscular Every 12 hours 11/19/18 0954 11/19/18 1000      Subjective:  Patient seen and examined at bedside this morning.  Hemodynamically stable.  Continues to have very poor oral intake.  Did not touch her breakfast this  morning. Objective:  Vitals:   12/02/18 0420 12/02/18 0724 12/02/18 0805 12/02/18 1145  BP: (!) 184/87  (!) 171/76 (!) 189/5  Pulse: 65  64 (!) 58  Resp:   14 16  Temp: 98.3 F (36.8 C)  98 F (36.7 C) 98.1 F (36.7 C)  TempSrc: Oral  Oral Oral  SpO2: 92% 94% 94% 100%  Weight: 67.7 kg     Height:        Intake/Output Summary (Last 24 hours) at 12/02/2018 1513 Last data filed at 12/02/2018 0800 Gross per 24 hour  Intake 60 ml  Output 975 ml  Net -915 ml   Filed Weights   11/29/18 0444 11/30/18 0354 12/02/18 0420  Weight: 66.7 kg 67.7 kg 67.7 kg    Examination:  General exam: Not in distress,average built,elderly female, generalized weakness HEENT:PERRL,Oral mucosa moist, Ear/Nose normal on gross exam Respiratory system: Bilateral decreased air entry on the bases Cardiovascular system: S1 & S2 heard, RRR. No JVD, murmurs, rubs, gallops or clicks. Trace pedal edema. Gastrointestinal system: Abdomen is nondistended, soft and nontender. No organomegaly or masses felt. Normal bowel sounds heard. Central nervous system: Alert and oriented. No focal neurological deficits. Extremities: Tarce edema, no clubbing ,no cyanosis, distal peripheral pulses palpable. Skin: No rashes, lesions or ulcers,no icterus ,no pallor . Data Reviewed: I have personally reviewed following labs and imaging studies  CBC: Recent Labs  Lab 11/27/18 0436 12/08/2018 0359 11/29/18 0642 11/30/18 0355 12/02/18 0527  WBC 5.9 6.4 6.1 6.5 8.5  NEUTROABS  --   --   --   --  6.5  HGB 7.7* 7.8* 8.0* 8.0* 8.1*  HCT 25.5* 26.0* 27.4* 27.3* 27.3*  MCV 96.2 98.9 97.2 97.5 96.1  PLT 180 185 205 210 226   Basic Metabolic Panel: Recent Labs  Lab 12/09/2018 0359 11/29/18 0642 11/30/18 0355 12/01/18 0402 12/02/18 0527  NA 144 147* 144 144 144  K 3.3* 4.0 3.4* 3.3* 3.3*  CL 108 111 112* 111 113*  CO2 _0 GLUCOSE 88 80 92 87 95  BUN _1 7*  CREATININE 1.14* 1.04* 0.97 0.95 0.93  CALCIUM  7.7* 8.1* 8.3* 8.7* 8.8*   GFR: Estimated Creatinine Clearance: 49.9 mL/min (by C-G formula based on SCr of 0.93 mg/dL). Liver Function Tests: No results for input(s): AST, ALT, ALKPHOS, BILITOT, PROT, ALBUMIN in the last 168 hours. No results for input(s): LIPASE, AMYLASE in the last 168 hours. No results for input(s): AMMONIA in the last 168 hours. Coagulation Profile: No results for input(s): INR, PROTIME in the last 168 hours. Cardiac Enzymes: No results for input(s): CKTOTAL, CKMB, CKMBINDEX, TROPONINI in the last 168 hours. BNP (last 3 results) No results for input(s): PROBNP in the last 8760 hours. HbA1C: No results for input(s): HGBA1C in the last 72 hours. CBG: No results for input(s): GLUCAP in the last 168 hours. Lipid Profile: No results for input(s): CHOL, HDL, LDLCALC, TRIG, CHOLHDL, LDLDIRECT in the last 72 hours. Thyroid Function Tests: No results for input(s): TSH, T4TOTAL, FREET4, T3FREE, THYROIDAB in the last 72 hours. Anemia Panel: No results for input(s): VITAMINB12, FOLATE, FERRITIN, TIBC, IRON, RETICCTPCT in the last 72 hours. Sepsis Labs: No results for input(s): PROCALCITON,  LATICACIDVEN in the last 168 hours.  No results found for this or any previous visit (from the past 240 hour(s)).       Radiology Studies: No results found.      Scheduled Meds: . amiodarone  100 mg Per Tube Daily  . budesonide (PULMICORT) nebulizer solution  0.25 mg Nebulization BID  . chlorhexidine  15 mL Mouth Rinse BID  . Chlorhexidine Gluconate Cloth  6 each Topical Daily  . feeding supplement (ENSURE ENLIVE)  237 mL Oral TID BM  . feeding supplement (PRO-STAT SUGAR FREE 64)  30 mL Oral BID  . ipratropium-albuterol  3 mL Nebulization BID  . mouth rinse  15 mL Mouth Rinse q12n4p  . metoprolol tartrate  12.5 mg Oral Daily  . pyridostigmine  30 mg Per Tube BID  . sodium chloride flush  10-40 mL Intracatheter Q12H   Continuous Infusions:   LOS: 20 days    Time  spent: 15 mins.More than 50% of that time was spent in counseling and/or coordination of care.      Shelly Coss, MD [facility] Pager [phone]  If 7PM-7AM, please contact night-coverage [email or web address] Password [facility] 12/02/2018, 3:13 PM

## 2018-12-02 NOTE — Progress Notes (Signed)
OT Cancellation Note  Patient Details Name: Abigail [name] MRN: [id number] DOB: 1946/02/23   Cancelled Treatment:    Reason Eval/Treat Not Completed: Medical issues which prohibited therapy(pt now under hospice care awaiting transfer to facility.)  No OT needs identified and OT signing off at this time.  Abigail [name]) Marsa Aris OTR/L Acute Rehabilitation Services Pager: [phone] Office: [phone]   Fredda Hammed 12/02/2018, 3:51 PM

## 2018-12-03 MED ORDER — HEPARIN SOD (PORK) LOCK FLUSH 100 UNIT/ML IV SOLN
250.0000 [IU] | INTRAVENOUS | Status: DC | PRN
  Administered 2018-12-03 (×2): 250 [IU]
  Filled 2018-12-03: qty 2.5

## 2018-12-03 MED ORDER — AMLODIPINE BESYLATE 2.5 MG PO TABS
2.5000 mg | ORAL_TABLET | Freq: Every day | ORAL | Status: DC
  Administered 2018-12-03: 2.5 mg via ORAL
  Filled 2018-12-03: qty 1

## 2018-12-03 MED ORDER — HEPARIN SOD (PORK) LOCK FLUSH 100 UNIT/ML IV SOLN
250.0000 [IU] | Freq: Every day | INTRAVENOUS | Status: DC
  Filled 2018-12-03: qty 2.5

## 2018-12-03 MED ORDER — HYDRALAZINE HCL 25 MG PO TABS
25.0000 mg | ORAL_TABLET | Freq: Two times a day (BID) | ORAL | Status: DC
  Administered 2018-12-03: 25 mg via ORAL
  Filled 2018-12-03: qty 1

## 2018-12-03 MED ORDER — POTASSIUM CHLORIDE CRYS ER 20 MEQ PO TBCR
40.0000 meq | EXTENDED_RELEASE_TABLET | Freq: Once | ORAL | Status: AC
  Administered 2018-12-03: 40 meq via ORAL
  Filled 2018-12-03: qty 2

## 2018-12-19 NOTE — Discharge Summary (Signed)
Physician Discharge Summary  DEKAYLA PRESTRIDGE URK:[id number] DOB: 06-14-1946 DOA: 11/12/2018  PCP: Nolene Ebbs, MD  Admit date: 11/12/2018 Discharge date: 12-15-2018  Admitted From: Home Disposition:  Residential hospice  Discharge Condition:Stable CODE STATUS: DNR Diet recommendation: Puree diet,dysphagia 1 Brief/Interim Summary:  Patient is a 73 year old female with history of myasthenia gravis, atrial fibrillation, CKD, multiple myeloma, failure to thrive who presented initially for VT/V. fib arrest x2.  Defibrillated 3 times.  Intubated prior to arrival to the hospital.  Overall status has improved but she continues to have poor oral intake.  NG tube has been out.  Remains weak.  Physical therapy recommended skilled nursing facility on discharge.  Palliative care consulted for discussion of goals of care.  After extensive discussion with the family, family opted for residential hospice due to her multiple comorbidities and persistent poor oral intake. Current plan is to discharge her to residential hospice today.  Following problems were addressed during her  Hospitalization:  V. tach/V. fib arrest/tachy-brady syndrome: Patient evaluated by EP and she is not a candidate for AICD.  Started on amiodarone.  Also on metoprolol.  Currently heart rate is controlled.  Acute respiratory failure with hypoxia: Resolved.  Currently saturating fine on room air.  Hypoxic encephalopathy: Improved.  Currently she is alert.  Communicates and obeys commands.  Poor oral intake: NG tube removed on 11/25/2018.  Continues to have poor oral intake.  Ate her breakfast today.  There was plan for tube feeding initiation but family against artificial feeding.  Palliative care following for discussion of goals of care.  Recommended hospice.  Family agreed on residential hospice.  Myasthenia gravis: On pyridostigmine  History of multiple myeloma: Has diffuse lytic bone lesions.  She received Zometa last December.   Chemo on hold.  Acute right lower extremity DVT: Started on heparin drip and currently on Lovenox.  Will stop anticoagulation now because patient is being moved to residential hospice She was also found to have right lower leg hematoma which has resolved.  Acute on chronic CKD stage III: Currently on baseline.  Microcytic anemia: Acute on chronic.  Currently stable.  Coronary artery disease: Cardiology was following.  History of PTCA to LAD/LCx in the past.  Depression: On mirtazapine.  Abdominal pain: Complains of generalized abdominal pain.  Found to have nonspecific colitis which is improved on repeat CT scan.  Debility/deconditioning/generalized weakness: PT evaluated the patient and recommended skilled nursing facility on discharge.Now the plan is to discharge her to residential hospice.      Discharge Diagnoses:  Active Problems:   Encounter for central line placement   Cardiac arrest with ventricular fibrillation (HCC)   Acute respiratory failure with hypoxia (HCC)   AKI (acute kidney injury) ([place])   Advanced care planning/counseling discussion   Goals of care, counseling/discussion   Palliative care by specialist   Thrush   Closed fracture of multiple ribs   Pneumonia   Poor appetite    Discharge Instructions  Discharge Instructions    Diet general   Complete by:  As directed      Allergies as of 2018/12/15      Reactions   Sulfa Antibiotics Rash      Medication List    STOP taking these medications   acyclovir 400 MG tablet Commonly known as:  ZOVIRAX   amiodarone 200 MG tablet Commonly known as:  PACERONE   atorvastatin 80 MG tablet Commonly known as:  LIPITOR   calcium carbonate 1250 (500 Ca) MG tablet Commonly  known as:  OS-CAL - dosed in mg of elemental calcium   cyclophosphamide 50 MG capsule Commonly known as:  CYTOXAN   dexamethasone 4 MG tablet Commonly known as:  DECADRON   diltiazem 30 MG tablet Commonly known as:   CARDIZEM   feeding supplement (ENSURE ENLIVE) Liqd   ferrous sulfate 325 (65 FE) MG tablet   hydrALAZINE 25 MG tablet Commonly known as:  APRESOLINE   nitroGLYCERIN 0.4 MG SL tablet Commonly known as:  NITROSTAT   oxyCODONE-acetaminophen 5-325 MG tablet Commonly known as:  PERCOCET/ROXICET   pantoprazole 40 MG tablet Commonly known as:  PROTONIX   Potassium Chloride ER 20 MEQ Tbcr Commonly known as:  K-TAB   pyridostigmine 60 MG tablet Commonly known as:  MESTINON   tiZANidine 4 MG tablet Commonly known as:  ZANAFLEX      Contact information for after-discharge care    Destination    [facility] Preferred SNF .   Service:  Skilled Nursing Contact information: [address] [address] [phone]             Allergies  Allergen Reactions  . Sulfa Antibiotics Rash    Consultations:  Cardiology, palliative care.   Procedures/Studies: Ct Abdomen Pelvis Wo Contrast  Result Date: 12/05/2018 CLINICAL DATA:  Abdominal pain and decreased oral intake, history of multiple myeloma EXAM: CT ABDOMEN AND PELVIS WITHOUT CONTRAST TECHNIQUE: Multidetector CT imaging of the abdomen and pelvis was performed following the standard protocol without IV contrast. COMPARISON:  10/22/2018 FINDINGS: Lower chest: Bilateral pleural effusions are noted small on the left and moderate on the right. Some associated lower lobe consolidation/atelectasis is noted increased from the prior exam. Hepatobiliary: Liver is within normal limits. There is dependent density within the gallbladder likely related to vicarious excretion of contrast material. Previous CT did not demonstrate any evidence of cholelithiasis. Pancreas: Unremarkable. No pancreatic ductal dilatation or surrounding inflammatory changes. Spleen: Normal in size without focal abnormality. Accessory splenules are noted. Adrenals/Urinary Tract: Adrenal glands are within normal limits. Kidneys are  well visualized bilaterally. No calculi or obstructive changes are seen. Hypo and hyperdense left renal cysts are again identified and stable from the recent exam. Bilateral renal artery stents are noted. Bladder is decompressed Stomach/Bowel: Scattered diverticular changes noted within the colon. The previously seen wall thickening in the descending colon has resolved in the interval from the prior exam. No findings to suggest diverticulitis are seen. No obstructive or inflammatory changes of the large or small bowel are noted. The appendix is not well visualized similar to that seen on the prior study. Vascular/Lymphatic: Aortic atherosclerosis. No enlarged abdominal or pelvic lymph nodes. Reproductive: Status post hysterectomy. No adnexal masses. Other: No abdominal wall hernia or abnormality. No abdominopelvic ascites. Musculoskeletal: Degenerative changes of the lumbar spine are seen stable from the prior exam. No acute bony abnormality is noted. Stable myelomatous changes at T12 are seen. IMPRESSION: New bilateral lower lobe consolidation/atelectasis with associated effusions right greater than left. Resolution of previously seen descending and sigmoid colon colitis. Chronic changes as described above without acute abnormality. Electronically Signed   By: Inez Catalina M.D.   On: 12/14/2018 19:37   Dg Chest 2 View  Result Date: 11/24/2018 CLINICAL DATA:  Pneumonia, coronary artery disease post MI, stage III chronic kidney disease, hypertension, multiple myeloma EXAM: CHEST - 2 VIEW COMPARISON:  11/18/2018 FINDINGS: Feeding tube extends into stomach. RIGHT arm PICC line tip projects over SVC. Enlargement of cardiac silhouette with pulmonary  vascular congestion. Atherosclerotic calcification aorta. Bibasilar opacities question infiltrate versus atelectasis. No definite pleural effusion or pneumothorax. Bones demineralized. Lytic foci are identified at the RIGHT clavicle, lateral LEFT sixth rib, question  myeloma. Fractures of the anterior LEFT fifth and sixth ribs identified. IMPRESSION: Bibasilar opacities RIGHT greater than LEFT question atelectasis versus infiltrate. LEFT rib fractures with lytic foci at the RIGHT clavicle and lateral LEFT sixth rib question myeloma. Electronically Signed   By: Lavonia Dana M.D.   On: 11/24/2018 12:05   Ct Head Wo Contrast  Result Date: 11/12/2018 CLINICAL DATA:  Altered mental status. Multiple myeloma. EXAM: CT HEAD WITHOUT CONTRAST TECHNIQUE: Contiguous axial images were obtained from the base of the skull through the vertex without intravenous contrast. COMPARISON:  01/23/2017 FINDINGS: Brain: Age related atrophy. Old small vessel infarction in the white matter of the left frontal lobe adjacent to the frontal horn of the left lateral ventricle. No other focal brain finding. No intra-axial mass, hemorrhage, hydrocephalus or extra-axial fluid collection. Vascular: There is atherosclerotic calcification of the major vessels at the base of the brain. Skull: Lytic lesions throughout the calvarium consistent with diffuse myeloma. No dominant lesion. Sinuses/Orbits: Clear/normal Other: None IMPRESSION: 1. No acute finding by CT. Age related atrophy. Old small vessel infarction left frontal lobe white matter. 2. Lytic lesions throughout the calvarium consistent with diffuse myeloma. Electronically Signed   By: Nelson Chimes M.D.   On: 11/12/2018 13:25   Dg Chest Port 1 View  Result Date: 11/18/2018 CLINICAL DATA:  Shortness of breath EXAM: PORTABLE CHEST 1 VIEW COMPARISON:  11/16/2018 FINDINGS: Cardiac shadow is enlarged. Coronary stenting is seen. Aortic calcifications are again noted. Feeding catheter and right jugular central line are noted in satisfactory position. Mild increased vascular congestion is noted without interstitial edema. IMPRESSION: Tubes and lines as described above. Mild vascular congestion Electronically Signed   By: Inez Catalina M.D.   On: 11/18/2018 09:05    Dg Chest Port 1 View  Result Date: 11/16/2018 CLINICAL DATA:  Respiratory failure. EXAM: PORTABLE CHEST 1 VIEW COMPARISON:  Radiograph of November 15, 2018. FINDINGS: Stable cardiomegaly. Atherosclerosis of thoracic aorta is noted. Endotracheal and nasogastric tubes have been removed. Right internal jugular catheter is unchanged in position. No pneumothorax is noted. Minimal bibasilar subsegmental atelectasis is noted. No significant pleural effusion is noted. Bony thorax is unremarkable. IMPRESSION: Endotracheal and nasogastric tubes have been removed. Right internal jugular catheter is unchanged in position. Minimal bibasilar subsegmental atelectasis. Electronically Signed   By: Marijo Conception, M.D.   On: 11/16/2018 09:21   Dg Chest Port 1 View  Result Date: 11/15/2018 CLINICAL DATA:  Intubated.  Acute respiratory failure with hypoxia. EXAM: PORTABLE CHEST 1 VIEW COMPARISON:  One-view chest x-ray 11/12/2018 FINDINGS: Endotracheal tube terminates 3.3 cm above the carina. NG tube terminates in the fundus the stomach. Right IJ line is in place. The heart is enlarged. Coronary stents in place. Aortic atherosclerosis is present. A new left pleural effusion and lower lobe airspace disease is present. Mild pulmonary vascular congestion is noted. Minimal atelectasis is present at the right base. IMPRESSION: 1. New left pleural effusion and airspace disease is concerning for aspiration or pneumonia. 2. Increasing pulmonary vascular congestion. 3. Support apparatus is stable. Electronically Signed   By: San Morelle M.D.   On: 11/15/2018 07:40   Dg Chest Port 1 View  Result Date: 11/12/2018 CLINICAL DATA:  Right central line placement EXAM: PORTABLE CHEST 1 VIEW COMPARISON:  November 12, 2018 FINDINGS:  A new right central line terminates in the central SVC. No pneumothorax. The ETT is in good position. The NG tube terminates below today's film. No other changes. IMPRESSION: 1. The new right central line  terminates in the SVC without a pneumothorax. 2. Other support apparatus as above. 3. No other abnormalities. Electronically Signed   By: Dorise Bullion III M.D   On: 11/12/2018 15:18   Dg Chest Port 1 View  Result Date: 11/12/2018 CLINICAL DATA:  Endotracheal tube placement. EXAM: PORTABLE CHEST 1 VIEW COMPARISON:  10/23/2018. FINDINGS: Endotracheal tube tip noted 3.1 cm above the carina. NG tube noted with tip at the gastroesophageal junction. Advancement of approximately 10-15 cm should be considered. Cardiomegaly with normal pulmonary vascularity. No focal infiltrate. No pleural effusion or pneumothorax. Gastric distention noted. IMPRESSION: 1. NG tube noted with its tip at the gastroesophageal junction. Advancement of approximately 10-15 cm should be considered. Gastric distention is noted. 2.  Endotracheal tube noted with tip 3.1 cm above the carina. 3. Cardiomegaly. No pulmonary venous congestion. No focal pulmonary infiltrate. Critical Value/emergent results were called by telephone at the time of interpretation on 11/12/2018 at 12:25 pm to nurse Claiborne Billings, who verbally acknowledged these results. Electronically Signed   By: Marcello Moores  Register   On: 11/12/2018 12:27   Dg Swallowing Func-speech Pathology  Result Date: 11/18/2018 Objective Swallowing Evaluation: Type of Study: MBS-Modified Barium Swallow Study Completed by Ellwood Dense, SLP Student, Supervised in line of sight and reviewed by Herbie [place] Patient Details Name: MEIYA WISLER MRN: [id number] Date of Birth: 1946-04-26 Today's Date: 11/18/2018 Time: SLP Start Time (ACUTE ONLY): 0130 -SLP Stop Time (ACUTE ONLY): 0200 SLP Time Calculation (min) (ACUTE ONLY): 30 min Past Medical History: Past Medical History: Diagnosis Date . ACS (acute coronary syndrome) ([place]) 06/2017 . Anemia   "off and on all my life" (12/22/2016) . Anxiety  . Bilateral renal artery stenosis ([place]) 1999  s/p stenting . Chronic kidney disease (CKD), stage III (moderate)  ([place])   Archie Endo 12/22/2016 . Coronary artery disease 1999 . Depression  . Diverticulosis  . Duodenitis  . DVT (deep venous thrombosis) (HCC)   on Coumadin . Dyspnea  . Gastric erosions  . Gastric ulcer  . GERD (gastroesophageal reflux disease)  . Heart murmur  . History of blood transfusion ~ 03/2016  "low HgB; practically nonexistent" . Hyperlipidemia  . Hypertension  . MI (myocardial infarction) ([place]) 1999 . Multiple myeloma ([place]) dx'd ~ 04/2016 . PAT (paroxysmal atrial tachycardia) ([place]) 12/22/2016 . Retinal hemorrhage, right eye  . Schatzki's ring  . Tachy-brady syndrome ([place])   Archie Endo 12/22/2016 . Tubular adenoma of colon  Past Surgical History: Past Surgical History: Procedure Laterality Date . APPENDECTOMY   . BACK SURGERY   . CATARACT EXTRACTION W/ INTRAOCULAR LENS IMPLANT Right 2014 . COLONOSCOPY WITH PROPOFOL N/A 09/04/2017  Procedure: COLONOSCOPY WITH PROPOFOL;  Surgeon: Doran Stabler, MD;  Location: [facility];  Service: Gastroenterology;  Laterality: N/A; . CORONARY ANGIOGRAPHY N/A 01/23/2017  Procedure: Coronary Angiography;  Surgeon: [name], MD;  Location: [place];  Service: Cardiovascular;  Laterality: N/A; . CORONARY ANGIOPLASTY WITH STENT PLACEMENT  1999 . CORONARY STENT INTERVENTION N/A 12/29/2016  Procedure: Coronary Stent Intervention;  Surgeon: Charolette Forward, MD;  Location: [facility];  Service: Cardiovascular;  Laterality: N/A; . CORONARY STENT INTERVENTION N/A 02/24/2017  Procedure: Coronary Stent Intervention;  Surgeon: Charolette Forward, MD;  Location: [facility];  Service: Cardiovascular;  Laterality: N/A; .  ESOPHAGOGASTRODUODENOSCOPY N/A 11/03/2015  Procedure: ESOPHAGOGASTRODUODENOSCOPY (EGD);  Surgeon: Carol Ada, MD;  Location: [facility] END[facility];  Service: Endoscopy;  Laterality: N/A; . ESOPHAGOGASTRODUODENOSCOPY N/A 09/03/2017  Procedure: ESOPHAGOGASTRODUODENOSCOPY (EGD);  Surgeon: Doran Stabler, MD;  Location: [facility];  Service: Gastroenterology;   Laterality: N/A; . ESOPHAGOGASTRODUODENOSCOPY (EGD) WITH PROPOFOL N/A 11/18/2016  Procedure: ESOPHAGOGASTRODUODENOSCOPY (EGD) WITH PROPOFOL;  Surgeon: Ladene Artist, MD;  Location: [facility] ENDOS[facility];  Service: Endoscopy;  Laterality: N/A; . LEFT HEART CATH AND CORONARY ANGIOGRAPHY N/A 12/24/2016  Procedure: Left Heart Cath and Coronary Angiography;  Surgeon: [name], MD;  Location: [place] [facility];  Service: Cardiovascular;  Laterality: N/A; . LEFT HEART CATH AND CORONARY ANGIOGRAPHY N/A 02/24/2017  Procedure: Left Heart Cath and Coronary Angiography;  Surgeon: Charolette Forward, MD;  Location: [facility];  Service: Cardiovascular;  Laterality: N/A; . LEFT HEART CATH AND CORONARY ANGIOGRAPHY N/A 03/05/2017  Procedure: Left Heart Cath and Coronary Angiography;  Surgeon: [name], MD;  Location: [place];  Service: Cardiovascular;  Laterality: N/A; . LEFT HEART CATH AND CORONARY ANGIOGRAPHY N/A 09/02/2018  Procedure: LEFT HEART CATH AND CORONARY ANGIOGRAPHY;  Surgeon: [name], MD;  Location: [facility] [facility];  Service: Cardiovascular;  Laterality: N/A; . LUMBAR [place]   . RENAL ARTERY STENT  1999  bil RAS so ? bil vs unilateral stents . RETINAL LASER PROCEDURE Right 2012  "bleeding" . TONSILLECTOMY   . TOTAL ABDOMINAL HYSTERECTOMY   HPI: Pt is a 73 yo female admitted post VT/VF arrest x2, shocked 3x and intubated in the field with approximately 12 minutes of cardiac arrest prior to achieving ROSC.  Upon arrival to the ED she was posturing with a R sided gaze preference. ETT 1/24-1/27. PMH: GERD, schatzki's ring, multiple myeloma, FTT, recent hospitalization for infectious colitis and septic shock, myasthenia gravis, afib, CKD, MI, HTN, HLD, CAD  Subjective: Pt confused but responsive but with cues Assessment / Plan / Recommendation CHL IP CLINICAL IMPRESSIONS 11/18/2018 Clinical Impression Pt presents with cognitive based oral dysphagia c/b significant oral holding and some premature  spillage. Oral holding of thin liquid and puree observed, but eventually pt swallowed, demonstrating strength in tongue-base retraction and hyolaryngeal excursion for airway protection. One instance of premature spillage of thin liquid noted, which resulted in flash penetration with immediate ejection of bolus. Mechanical soft PO held in oral cavity until it had be physically removed, even after max cues to masticate. Some coughing noted in between POs, however with fluoroscopy turned on there was no evidence of oral or pharyngeal cause. Recommend puree, thin diet with meds crushed in puree. Will f/u for consistent oral intake and tolerance of diet with improved mentation. SLP Visit Diagnosis Dysphagia, oral phase (R13.11) Attention and concentration deficit following -- Frontal lobe and executive function deficit following -- Impact on safety and function --   CHL IP TREATMENT RECOMMENDATION 11/18/2018 Treatment Recommendations Therapy as outlined in treatment plan below   Prognosis 11/18/2018 Prognosis for Safe Diet Advancement Good Barriers to Reach Goals Cognitive deficits Barriers/Prognosis Comment -- CH[facility] IP DIET RECOMMENDATION 11/18/2018 SLP Diet Recommendations Dysphagia 1 (Puree) solids;Thin liquid Liquid Administration via Straw;Cup Medication Administration Crushed with puree Compensations -- Postural Changes --   CHL IP OTHER RECOMMENDATIONS 11/18/2018 Recommended Consults -- Oral Care Recommendations Oral care QID Other Recommendations Have oral suction available   CH[facility] IP FOLLOW UP RECOMMENDATIONS 11/18/2018 Follow up Recommendations Skilled Nursing facility   [facility] IP FREQUENCY AND DURATION 11/18/2018 Speech Therapy Frequency (ACUTE ONLY)  min 2x/week Treatment Duration 2 weeks      CHL IP ORAL PHASE 11/18/2018 Oral Phase Impaired Oral - Pudding Teaspoon -- Oral - Pudding Cup -- Oral - Honey Teaspoon -- Oral - Honey Cup -- Oral - Nectar Teaspoon -- Oral - Nectar Cup -- Oral - Nectar Straw -- Oral - Thin Teaspoon  -- Oral - Thin Cup Holding of bolus Oral - Thin Straw Holding of bolus Oral - Puree Holding of bolus Oral - Mech Soft Holding of bolus Oral - Regular -- Oral - Multi-Consistency -- Oral - Pill -- Oral Phase - Comment --  CHL IP PHARYNGEAL PHASE 11/18/2018 Pharyngeal Phase WFL Pharyngeal- Pudding Teaspoon -- Pharyngeal -- Pharyngeal- Pudding Cup -- Pharyngeal -- Pharyngeal- Honey Teaspoon -- Pharyngeal -- Pharyngeal- Honey Cup -- Pharyngeal -- Pharyngeal- Nectar Teaspoon -- Pharyngeal -- Pharyngeal- Nectar Cup -- Pharyngeal -- Pharyngeal- Nectar Straw -- Pharyngeal -- Pharyngeal- Thin Teaspoon -- Pharyngeal -- Pharyngeal- Thin Cup -- Pharyngeal -- Pharyngeal- Thin Straw -- Pharyngeal -- Pharyngeal- Puree -- Pharyngeal -- Pharyngeal- Mechanical Soft -- Pharyngeal -- Pharyngeal- Regular -- Pharyngeal -- Pharyngeal- Multi-consistency -- Pharyngeal -- Pharyngeal- Pill -- Pharyngeal -- Pharyngeal Comment --  CHL IP CERVICAL ESOPHAGEAL PHASE 11/18/2018 Cervical Esophageal Phase WFL Pudding Teaspoon -- Pudding Cup -- Honey Teaspoon -- Honey Cup -- Nectar Teaspoon -- Nectar Cup -- Nectar Straw -- Thin Teaspoon -- Thin Cup -- Thin Straw -- Puree -- Mechanical Soft -- Regular -- Multi-consistency -- Pill -- Cervical Esophageal Comment -- DeBlois, Katherene Ponto 11/18/2018, 3:04 PM              Vas [place] Lower Extremity Venous (dvt)  Result Date: 11/16/2018  Lower Venous Study Other Indications: Status post arrest. Limitations: Body habitus and edema. Performing Technologist: Sharion Dove RVS  Examination Guidelines: A complete evaluation includes B-mode imaging, spectral Doppler, color Doppler, and power Doppler as needed of all accessible portions of each vessel. Bilateral testing is considered an integral part of a complete examination. Limited examinations for reoccurring indications may be performed as noted.  Right Venous Findings: +---------+---------------+---------+-----------+----------+--------------+           CompressibilityPhasicitySpontaneityPropertiesSummary        +---------+---------------+---------+-----------+----------+--------------+ CFV      Full           Yes      Yes                                 +---------+---------------+---------+-----------+----------+--------------+ SFJ      Full                                                        +---------+---------------+---------+-----------+----------+--------------+ FV Prox  None                                         Acute          +---------+---------------+---------+-----------+----------+--------------+ FV Mid   None                                         Acute          +---------+---------------+---------+-----------+----------+--------------+  FV DistalNone                                         Acute          +---------+---------------+---------+-----------+----------+--------------+ PFV      Full                                         Acute          +---------+---------------+---------+-----------+----------+--------------+ POP      None                                         Acute          +---------+---------------+---------+-----------+----------+--------------+ PTV                                                   Not visualized +---------+---------------+---------+-----------+----------+--------------+ PERO                                                  Not visualized +---------+---------------+---------+-----------+----------+--------------+  Right Technical Findings: Not visualized segments include posterior tibial and peroneal veins.  Left Venous Findings: +-------+---------------+---------+-----------+----------+-------+        CompressibilityPhasicitySpontaneityPropertiesSummary +-------+---------------+---------+-----------+----------+-------+ CFV    Full           Yes      Yes                           +-------+---------------+---------+-----------+----------+-------+ SFJ    Full                                                 +-------+---------------+---------+-----------+----------+-------+ FV ProxFull                                                 +-------+---------------+---------+-----------+----------+-------+ PFV    Full                                                 +-------+---------------+---------+-----------+----------+-------+    Summary: Right: Findings consistent with acute deep vein thrombosis involving the right femoral vein, and right popliteal vein. Left: No evidence of common femoral vein obstruction.  *See table(s) above for measurements and observations. Electronically signed by Ruta Hinds MD on 11/16/2018 at 11:50:49 AM.    Final    [place] Ekg Site Rite  Result Date: 11/18/2018 If Site Rite image not attached, placement could not be confirmed due to current cardiac rhythm.      Subjective: Patient seen and examined  at the bedside this morning.  Lying in the bed.  Food tray on the bedside.  Patient does not want to eat.  Discharge Exam: Vitals:   12/02/18 2117 2018-12-21 0425  BP: (!) 181/82 (!) 188/83  Pulse: (!) 59 (!) 56  Resp: (!) 23 16  Temp: 98.9 F (37.2 C) 97.9 F (36.6 C)  SpO2: 96% 94%   Vitals:   12/02/18 1645 12/02/18 2117 12-21-2018 0424 Dec 21, 2018 0425  BP: (!) 180/88 (!) 181/82  (!) 188/83  Pulse: (!) 58 (!) 59  (!) 56  Resp:  (!) 23  16  Temp: 99.1 F (37.3 C) 98.9 F (37.2 C)  97.9 F (36.6 C)  TempSrc: Oral Oral  Oral  SpO2:  96%  94%  Weight:   64.9 kg   Height:        General: Pt is alert, awake, not in acute distress Cardiovascular: RRR, S1/S2 +, no rubs, no gallops Respiratory: CTA bilaterally, no wheezing, no rhonchi Abdominal: Soft, NT, ND, bowel sounds + Extremities: no edema, no cyanosis    The results of significant diagnostics from this hospitalization (including imaging, microbiology, ancillary and  laboratory) are listed below for reference.     Microbiology: No results found for this or any previous visit (from the past 240 hour(s)).   Labs: BNP (last 3 results) Recent Labs    12/13/17 2243  BNP 530.0*   Basic Metabolic Panel: Recent Labs  Lab 12/16/2018 0359 11/29/18 0642 11/30/18 0355 12/01/18 0402 12/02/18 0527  NA 144 147* 144 144 144  K 3.3* 4.0 3.4* 3.3* 3.3*  CL 108 111 112* 111 113*  CO2 '26 23 24 22 23  ' GLUCOSE 88 80 92 87 95  BUN '12 10 8 9 ' 7*  CREATININE 1.14* 1.04* 0.97 0.95 0.93  CALCIUM 7.7* 8.1* 8.3* 8.7* 8.8*   Liver Function Tests: No results for input(s): AST, ALT, ALKPHOS, BILITOT, PROT, ALBUMIN in the last 168 hours. No results for input(s): LIPASE, AMYLASE in the last 168 hours. No results for input(s): AMMONIA in the last 168 hours. CBC: Recent Labs  Lab 11/27/18 0436 12/17/2018 0359 11/29/18 0642 11/30/18 0355 12/02/18 0527  WBC 5.9 6.4 6.1 6.5 8.5  NEUTROABS  --   --   --   --  6.5  HGB 7.7* 7.8* 8.0* 8.0* 8.1*  HCT 25.5* 26.0* 27.4* 27.3* 27.3*  MCV 96.2 98.9 97.2 97.5 96.1  PLT 180 185 205 210 300   Cardiac Enzymes: No results for input(s): CKTOTAL, CKMB, CKMBINDEX, TROPONINI in the last 168 hours. BNP: Invalid input(s): POCBNP CBG: No results for input(s): GLUCAP in the last 168 hours. D-Dimer No results for input(s): DDIMER in the last 72 hours. Hgb A1c No results for input(s): HGBA1C in the last 72 hours. Lipid Profile No results for input(s): CHOL, HDL, LDLCALC, TRIG, CHOLHDL, LDLDIRECT in the last 72 hours. Thyroid function studies No results for input(s): TSH, T4TOTAL, T3FREE, THYROIDAB in the last 72 hours.  Invalid input(s): FREET3 Anemia work up No results for input(s): VITAMINB12, FOLATE, FERRITIN, TIBC, IRON, RETICCTPCT in the last 72 hours. Urinalysis    Component Value Date/Time   COLORURINE YELLOW 11/13/2018 0836   APPEARANCEUR HAZY (A) 11/13/2018 0836   LABSPEC 1.008 11/13/2018 0836   LABSPEC 1.010  10/30/2016 1522   PHURINE 5.0 11/13/2018 0836   GLUCOSEU NEGATIVE 11/13/2018 0836   GLUCOSEU Negative 10/30/2016 1522   HGBUR SMALL (A) 11/13/2018 0836   BILIRUBINUR NEGATIVE 11/13/2018 0836   BILIRUBINUR Negative 10/30/2016  [place] 11/13/2018 0836   PROTEINUR NEGATIVE 11/13/2018 0836   UROBILINOGEN 0.2 10/30/2016 1522   NITRITE NEGATIVE 11/13/2018 0836   LEUKOCYTESUR TRACE (A) 11/13/2018 0836   LEUKOCYTESUR Trace 10/30/2016 1522   Sepsis Labs Invalid input(s): PROCALCITONIN,  WBC,  LACTICIDVEN Microbiology No results found for this or any previous visit (from the past 240 hour(s)).  Please note: You were cared for by a hospitalist during your hospital stay. Once you are discharged, your primary care physician will handle any further medical issues. Please note that NO REFILLS for any discharge medications will be authorized once you are discharged, as it is imperative that you return to your primary care physician (or establish a relationship with a primary care physician if you do not have one) for your post hospital discharge needs so that they can reassess your need for medications and monitor your lab values.    Time coordinating discharge: 40 minutes  SIGNED:   Shelly Coss, MD  [facility] 12-28-18, 8:24 AM Pager [phone]  If 7PM-7AM, please contact night-coverage [email or web address] Password [email or web address]

## 2018-12-19 NOTE — Care Management Important Message (Signed)
Important Message  Patient Details  Name: Abigail [name] MRN: [id number] Date of Birth: April 09, 1946   Medicare Important Message Given:  Yes    [name] 12/30/18, 11:53 AM

## 2018-12-19 NOTE — Consult Note (Signed)
Ref: Nolene Ebbs, MD   Subjective:  Awake. Elevated BP. Needs encouragement with feeding. Awaiting residential hospice placement. Hypokalemia persist.  Objective:  Vital Signs in the last 24 hours: Temp:  [97.9 F (36.6 C)-99.1 F (37.3 C)] 97.9 F (36.6 C) (02/14 0425) Pulse Rate:  [56-59] 56 (02/14 0425) Cardiac Rhythm: Normal sinus rhythm (02/13 1900) Resp:  [14-23] 14 (02/14 0900) BP: (180-189)/(82-92) 186/92 (02/14 0900) SpO2:  [94 %-100 %] 94 % (02/14 0425) Weight:  [64.9 kg] 64.9 kg (02/14 0424)  Physical Exam: BP Readings from Last 1 Encounters:  12-26-2018 (!) 186/92     Wt Readings from Last 1 Encounters:  2018-12-26 64.9 kg    Weight change: -2.79 kg Body mass index is 23.81 kg/m. HEENT: [place]/AT, Eyes-Brown, PERL, EOMI, Conjunctiva-Pale, Sclera-Non-icteric Neck: No JVD, No bruit, Trachea midline. Lungs:  Clearing, Bilateral. Cardiac:  Regular rhythm, normal S1 and S2, no S3. II/VI systolic murmur. Abdomen:  Soft, non-tender. BS present. Extremities:  No edema present. No cyanosis. No clubbing. CNS: AxOx3, Cranial nerves grossly intact, moves all 4 extremities.  Skin: Warm and dry.   Intake/Output from previous day: 02/13 0701 - 02/14 0700 In: 60 [P.O.:60] Out: -     Lab Results: BMET    Component Value Date/Time   NA 144 12/02/2018 0527   NA 144 12/01/2018 0402   NA 144 11/30/2018 0355   NA 144 09/17/2017 1429   NA 141 07/30/2017 1504   NA 142 06/11/2017 1451   K 3.3 (L) 12/02/2018 0527   K 3.3 (L) 12/01/2018 0402   K 3.4 (L) 11/30/2018 0355   K 3.1 (L) 09/17/2017 1429   K 4.8 07/30/2017 1504   K 4.3 06/11/2017 1451   CL 113 (H) 12/02/2018 0527   CL 111 12/01/2018 0402   CL 112 (H) 11/30/2018 0355   CO2 23 12/02/2018 0527   CO2 22 12/01/2018 0402   CO2 24 11/30/2018 0355   CO2 23 09/17/2017 1429   CO2 20 (L) 07/30/2017 1504   CO2 20 (L) 06/11/2017 1451   GLUCOSE 95 12/02/2018 0527   GLUCOSE 87 12/01/2018 0402   GLUCOSE 92 11/30/2018 0355    GLUCOSE 96 09/17/2017 1429   GLUCOSE 79 07/30/2017 1504   GLUCOSE 74 06/11/2017 1451   BUN 7 (L) 12/02/2018 0527   BUN 9 12/01/2018 0402   BUN 8 11/30/2018 0355   BUN 15.6 09/17/2017 1429   BUN 24.4 07/30/2017 1504   BUN 18.7 06/11/2017 1451   CREATININE 0.93 12/02/2018 0527   CREATININE 0.95 12/01/2018 0402   CREATININE 0.97 11/30/2018 0355   CREATININE 1.97 (H) 10/21/2018 1055   CREATININE 1.36 (H) 10/07/2018 0823   CREATININE 1.41 (H) 09/30/2018 1005   CREATININE 1.0 09/17/2017 1429   CREATININE 1.2 (H) 07/30/2017 1504   CREATININE 1.2 (H) 06/11/2017 1451   CALCIUM 8.8 (L) 12/02/2018 0527   CALCIUM 8.7 (L) 12/01/2018 0402   CALCIUM 8.3 (L) 11/30/2018 0355   CALCIUM 8.6 09/17/2017 1429   CALCIUM 9.1 07/30/2017 1504   CALCIUM 9.6 06/11/2017 1451   GFRNONAA >60 12/02/2018 0527   GFRNONAA >60 12/01/2018 0402   GFRNONAA 59 (L) 11/30/2018 0355   GFRNONAA 25 (L) 10/21/2018 1055   GFRNONAA 39 (L) 10/07/2018 0823   GFRNONAA 37 (L) 09/30/2018 1005   GFRAA >60 12/02/2018 0527   GFRAA >60 12/01/2018 0402   GFRAA >60 11/30/2018 0355   GFRAA 29 (L) 10/21/2018 1055   GFRAA 45 (L) 10/07/2018 0823   GFRAA 43 (  L) 09/30/2018 1005   CBC    Component Value Date/Time   WBC 8.5 12/02/2018 0527   RBC 2.84 (L) 12/02/2018 0527   HGB 8.1 (L) 12/02/2018 0527   HGB 9.1 (L) 10/21/2018 1055   HGB 11.3 (L) 09/17/2017 1428   HCT 27.3 (L) 12/02/2018 0527   HCT 35.8 09/17/2017 1428   PLT 300 12/02/2018 0527   PLT 271 10/21/2018 1055   PLT 231 09/17/2017 1428   MCV 96.1 12/02/2018 0527   MCV 93.2 09/17/2017 1428   MCH 28.5 12/02/2018 0527   MCHC 29.7 (L) 12/02/2018 0527   RDW 18.3 (H) 12/02/2018 0527   RDW 16.5 (H) 09/17/2017 1428   LYMPHSABS 1.0 12/02/2018 0527   LYMPHSABS 0.7 (L) 09/17/2017 1428   MONOABS 0.8 12/02/2018 0527   MONOABS 0.5 09/17/2017 1428   EOSABS 0.0 12/02/2018 0527   EOSABS 0.1 09/17/2017 1428   BASOSABS 0.0 12/02/2018 0527   BASOSABS 0.0 09/17/2017 1428    HEPATIC Function Panel Recent Labs    11/14/18 0400 11/15/18 0931 11/21/18 0430  PROT 5.2* 5.3* 5.0*   HEMOGLOBIN A1C No components found for: HGA1C,  MPG CARDIAC ENZYMES Lab Results  Component Value Date   CKTOTAL 52 08/21/2017   CKMB 1.3 08/21/2017   TROPONINI 0.94 (HH) 11/13/2018   TROPONINI 0.92 (HH) 11/13/2018   TROPONINI 0.80 (HH) 11/12/2018   BNP No results for input(s): PROBNP in the last 8760 hours. TSH Recent Labs    12/13/17 2243 10/02/18 1825  TSH 1.922 0.751   CHOLESTEROL Recent Labs    12/14/17 0457 09/02/18 0152 10/03/18 0637  CHOL 167 143 136    Scheduled Meds: . amiodarone  100 mg Per Tube Daily  . amLODipine  2.5 mg Oral Daily  . budesonide (PULMICORT) nebulizer solution  0.25 mg Nebulization BID  . chlorhexidine  15 mL Mouth Rinse BID  . Chlorhexidine Gluconate Cloth  6 each Topical Daily  . feeding supplement (ENSURE ENLIVE)  237 mL Oral TID BM  . feeding supplement (PRO-STAT SUGAR FREE 64)  30 mL Oral BID  . hydrALAZINE  25 mg Oral BID  . ipratropium-albuterol  3 mL Nebulization BID  . mouth rinse  15 mL Mouth Rinse q12n4p  . metoprolol tartrate  12.5 mg Oral Daily  . pyridostigmine  30 mg Per Tube BID  . sodium chloride flush  10-40 mL Intracatheter Q12H   Continuous Infusions: PRN Meds:.oxyCODONE, sodium chloride flush, traMADol  Assessment/Plan: V. 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    LOS: 21 days    [name]  MD  2018-12-18, 9:22 AM

## 2018-12-19 NOTE — Progress Notes (Addendum)
[facility]  [place] room available for patient today. Paper work for transfer today was completed yesterday. Discharge summary has been sent to [facility].   RN please call report to [facility] at [phone].  RN please leave PICC line accessible for use at [facility].   Thank you,  Erling Conte, LCSW [phone]

## 2018-12-19 NOTE — Social Work (Addendum)
Patient will discharge to [facility] Anticipated discharge date: 2018/12/04 Family notified: Suanne Marker, sister (left voicemail) Transportation by: [facility]  Nurse to call report to [phone].  CSW signing off.  Estanislado Emms, [place]  Clinical Social Worker

## 2018-12-19 NOTE — Consult Note (Signed)
Late Entry  Ref: Nolene Ebbs, MD   Subjective:  Awake. Hypokalemia persist. Awaiting residential hospice placement.  Objective:  Vital Signs in the last 24 hours: Temp:  [97.9 F (36.6 C)-99.1 F (37.3 C)] 97.9 F (36.6 C) (02/14 0425) Pulse Rate:  [55-59] 55 (02/14 0930) Cardiac Rhythm: Normal sinus rhythm (02/13 1900) Resp:  [14-23] 14 (02/14 0930) BP: (180-189)/(82-92) 186/92 (02/14 0900) SpO2:  [94 %-100 %] 95 % (02/14 0930) Weight:  [64.9 kg] 64.9 kg (02/14 0424)  Physical Exam: BP Readings from Last 1 Encounters:  December 19, 2018 (!) 186/92     Wt Readings from Last 1 Encounters:  Dec 19, 2018 64.9 kg    Weight change: -2.79 kg Body mass index is 23.81 kg/m. HEENT: [place]/AT, Eyes-Brown, PERL, EOMI, Conjunctiva-Pale, Sclera-Non-icteric Neck: No JVD, No bruit, Trachea midline. Lungs:  Clearing, Bilateral. Cardiac:  Regular rhythm, normal S1 and S2, no S3. II/VI systolic murmur. Abdomen:  Soft, non-tender. BS present. Extremities:  No edema present. No cyanosis. No clubbing. CNS: AxOx3, Cranial nerves grossly intact, moves all 4 extremities.  Skin: Warm and dry.   Intake/Output from previous day: 02/13 0701 - 02/14 0700 In: 60 [P.O.:60] Out: -     Lab Results: BMET    Component Value Date/Time   NA 144 12/02/2018 0527   NA 144 12/01/2018 0402   NA 144 11/30/2018 0355   NA 144 09/17/2017 1429   NA 141 07/30/2017 1504   NA 142 06/11/2017 1451   K 3.3 (L) 12/02/2018 0527   K 3.3 (L) 12/01/2018 0402   K 3.4 (L) 11/30/2018 0355   K 3.1 (L) 09/17/2017 1429   K 4.8 07/30/2017 1504   K 4.3 06/11/2017 1451   CL 113 (H) 12/02/2018 0527   CL 111 12/01/2018 0402   CL 112 (H) 11/30/2018 0355   CO2 23 12/02/2018 0527   CO2 22 12/01/2018 0402   CO2 24 11/30/2018 0355   CO2 23 09/17/2017 1429   CO2 20 (L) 07/30/2017 1504   CO2 20 (L) 06/11/2017 1451   GLUCOSE 95 12/02/2018 0527   GLUCOSE 87 12/01/2018 0402   GLUCOSE 92 11/30/2018 0355   GLUCOSE 96 09/17/2017 1429   GLUCOSE 79 07/30/2017 1504   GLUCOSE 74 06/11/2017 1451   BUN 7 (L) 12/02/2018 0527   BUN 9 12/01/2018 0402   BUN 8 11/30/2018 0355   BUN 15.6 09/17/2017 1429   BUN 24.4 07/30/2017 1504   BUN 18.7 06/11/2017 1451   CREATININE 0.93 12/02/2018 0527   CREATININE 0.95 12/01/2018 0402   CREATININE 0.97 11/30/2018 0355   CREATININE 1.97 (H) 10/21/2018 1055   CREATININE 1.36 (H) 10/07/2018 0823   CREATININE 1.41 (H) 09/30/2018 1005   CREATININE 1.0 09/17/2017 1429   CREATININE 1.2 (H) 07/30/2017 1504   CREATININE 1.2 (H) 06/11/2017 1451   CALCIUM 8.8 (L) 12/02/2018 0527   CALCIUM 8.7 (L) 12/01/2018 0402   CALCIUM 8.3 (L) 11/30/2018 0355   CALCIUM 8.6 09/17/2017 1429   CALCIUM 9.1 07/30/2017 1504   CALCIUM 9.6 06/11/2017 1451   GFRNONAA >60 12/02/2018 0527   GFRNONAA >60 12/01/2018 0402   GFRNONAA 59 (L) 11/30/2018 0355   GFRNONAA 25 (L) 10/21/2018 1055   GFRNONAA 39 (L) 10/07/2018 0823   GFRNONAA 37 (L) 09/30/2018 1005   GFRAA >60 12/02/2018 0527   GFRAA >60 12/01/2018 0402   GFRAA >60 11/30/2018 0355   GFRAA 29 (L) 10/21/2018 1055   GFRAA 45 (L) 10/07/2018 0823   GFRAA 43 (L) 09/30/2018 1005  CBC    Component Value Date/Time   WBC 8.5 12/02/2018 0527   RBC 2.84 (L) 12/02/2018 0527   HGB 8.1 (L) 12/02/2018 0527   HGB 9.1 (L) 10/21/2018 1055   HGB 11.3 (L) 09/17/2017 1428   HCT 27.3 (L) 12/02/2018 0527   HCT 35.8 09/17/2017 1428   PLT 300 12/02/2018 0527   PLT 271 10/21/2018 1055   PLT 231 09/17/2017 1428   MCV 96.1 12/02/2018 0527   MCV 93.2 09/17/2017 1428   MCH 28.5 12/02/2018 0527   MCHC 29.7 (L) 12/02/2018 0527   RDW 18.3 (H) 12/02/2018 0527   RDW 16.5 (H) 09/17/2017 1428   LYMPHSABS 1.0 12/02/2018 0527   LYMPHSABS 0.7 (L) 09/17/2017 1428   MONOABS 0.8 12/02/2018 0527   MONOABS 0.5 09/17/2017 1428   EOSABS 0.0 12/02/2018 0527   EOSABS 0.1 09/17/2017 1428   BASOSABS 0.0 12/02/2018 0527   BASOSABS 0.0 09/17/2017 1428   HEPATIC Function Panel Recent Labs     11/14/18 0400 11/15/18 0931 11/21/18 0430  PROT 5.2* 5.3* 5.0*   HEMOGLOBIN A1C No components found for: HGA1C,  MPG CARDIAC ENZYMES Lab Results  Component Value Date   CKTOTAL 52 08/21/2017   CKMB 1.3 08/21/2017   TROPONINI 0.94 (HH) 11/13/2018   TROPONINI 0.92 (HH) 11/13/2018   TROPONINI 0.80 (HH) 11/12/2018   BNP No results for input(s): PROBNP in the last 8760 hours. TSH Recent Labs    12/13/17 2243 10/02/18 1825  TSH 1.922 0.751   CHOLESTEROL Recent Labs    12/14/17 0457 09/02/18 0152 10/03/18 0637  CHOL 167 143 136    Scheduled Meds: . amiodarone  100 mg Per Tube Daily  . amLODipine  2.5 mg Oral Daily  . budesonide (PULMICORT) nebulizer solution  0.25 mg Nebulization BID  . chlorhexidine  15 mL Mouth Rinse BID  . Chlorhexidine Gluconate Cloth  6 each Topical Daily  . feeding supplement (ENSURE ENLIVE)  237 mL Oral TID BM  . feeding supplement (PRO-STAT SUGAR FREE 64)  30 mL Oral BID  . hydrALAZINE  25 mg Oral BID  . ipratropium-albuterol  3 mL Nebulization BID  . mouth rinse  15 mL Mouth Rinse q12n4p  . metoprolol tartrate  12.5 mg Oral Daily  . potassium chloride  40 mEq Oral Once  . pyridostigmine  30 mg Per Tube BID  . sodium chloride flush  10-40 mL Intracatheter Q12H   Continuous Infusions: PRN Meds:.oxyCODONE, sodium chloride flush, traMADol  Assessment/Plan: V. Fib cardiac arrest Hypoxic encephalopathy Generalized weakness S/P acute respiratory distress H/O atrial fibrillation CAD S/P stents in LAD and LCx Multiple myeloma Myasthenia gravis Right leg acute DVT Possible PE Right knee area pain and superficial hematoma  Continue medical treatment.   LOS: 21 days    [name]  MD  Dec 21, 2018, 9:35 AM

## 2018-12-19 NOTE — Progress Notes (Signed)
Pt being d/c to [facility]. Report called and given, IV team came and hep locked Picc. Picc line dressing changed. Pt denies any pain.

## 2018-12-19 NOTE — Consult Note (Signed)
Late entry.  Ref: Nolene Ebbs, MD   Subjective:  Awake. Hypokalemia. No episode of GI bleed. Poor oral intake.  Objective:  Vital Signs in the last 24 hours: Temp:  [97.9 F (36.6 C)-99.1 F (37.3 C)] 97.9 F (36.6 C) (02/14 0425) Pulse Rate:  [55-59] 55 (02/14 0930) Cardiac Rhythm: Normal sinus rhythm (02/13 1900) Resp:  [14-23] 14 (02/14 0930) BP: (180-189)/(82-92) 186/92 (02/14 0900) SpO2:  [94 %-100 %] 95 % (02/14 0930) Weight:  [64.9 kg] 64.9 kg (02/14 0424)  Physical Exam: BP Readings from Last 1 Encounters:  12-12-2018 (!) 186/92     Wt Readings from Last 1 Encounters:  12/12/18 64.9 kg    Weight change: -2.79 kg Body mass index is 23.81 kg/m. HEENT: [place]/AT, Eyes-Brown, PERL, EOMI, Conjunctiva-Pale, Sclera-Non-icteric Neck: No JVD, No bruit, Trachea midline. Lungs:  Clearing, Bilateral. Cardiac:  Regular rhythm, normal S1 and S2, no S3. II/VI systolic murmur. Abdomen:  Soft, non-tender. BS present. Extremities:  Mild right leg swelling is present. No cyanosis. No clubbing. CNS: AxOx3, Cranial nerves grossly intact, moves all 4 extremities.  Skin: Warm and dry.   Intake/Output from previous day: 02/13 0701 - 02/14 0700 In: 60 [P.O.:60] Out: -     Lab Results: BMET    Component Value Date/Time   NA 144 12/02/2018 0527   NA 144 12/01/2018 0402   NA 144 11/30/2018 0355   NA 144 09/17/2017 1429   NA 141 07/30/2017 1504   NA 142 06/11/2017 1451   K 3.3 (L) 12/02/2018 0527   K 3.3 (L) 12/01/2018 0402   K 3.4 (L) 11/30/2018 0355   K 3.1 (L) 09/17/2017 1429   K 4.8 07/30/2017 1504   K 4.3 06/11/2017 1451   CL 113 (H) 12/02/2018 0527   CL 111 12/01/2018 0402   CL 112 (H) 11/30/2018 0355   CO2 23 12/02/2018 0527   CO2 22 12/01/2018 0402   CO2 24 11/30/2018 0355   CO2 23 09/17/2017 1429   CO2 20 (L) 07/30/2017 1504   CO2 20 (L) 06/11/2017 1451   GLUCOSE 95 12/02/2018 0527   GLUCOSE 87 12/01/2018 0402   GLUCOSE 92 11/30/2018 0355   GLUCOSE 96  09/17/2017 1429   GLUCOSE 79 07/30/2017 1504   GLUCOSE 74 06/11/2017 1451   BUN 7 (L) 12/02/2018 0527   BUN 9 12/01/2018 0402   BUN 8 11/30/2018 0355   BUN 15.6 09/17/2017 1429   BUN 24.4 07/30/2017 1504   BUN 18.7 06/11/2017 1451   CREATININE 0.93 12/02/2018 0527   CREATININE 0.95 12/01/2018 0402   CREATININE 0.97 11/30/2018 0355   CREATININE 1.97 (H) 10/21/2018 1055   CREATININE 1.36 (H) 10/07/2018 0823   CREATININE 1.41 (H) 09/30/2018 1005   CREATININE 1.0 09/17/2017 1429   CREATININE 1.2 (H) 07/30/2017 1504   CREATININE 1.2 (H) 06/11/2017 1451   CALCIUM 8.8 (L) 12/02/2018 0527   CALCIUM 8.7 (L) 12/01/2018 0402   CALCIUM 8.3 (L) 11/30/2018 0355   CALCIUM 8.6 09/17/2017 1429   CALCIUM 9.1 07/30/2017 1504   CALCIUM 9.6 06/11/2017 1451   GFRNONAA >60 12/02/2018 0527   GFRNONAA >60 12/01/2018 0402   GFRNONAA 59 (L) 11/30/2018 0355   GFRNONAA 25 (L) 10/21/2018 1055   GFRNONAA 39 (L) 10/07/2018 0823   GFRNONAA 37 (L) 09/30/2018 1005   GFRAA >60 12/02/2018 0527   GFRAA >60 12/01/2018 0402   GFRAA >60 11/30/2018 0355   GFRAA 29 (L) 10/21/2018 1055   GFRAA 45 (L) 10/07/2018 8099  GFRAA 43 (L) 09/30/2018 1005   CBC    Component Value Date/Time   WBC 8.5 12/02/2018 0527   RBC 2.84 (L) 12/02/2018 0527   HGB 8.1 (L) 12/02/2018 0527   HGB 9.1 (L) 10/21/2018 1055   HGB 11.3 (L) 09/17/2017 1428   HCT 27.3 (L) 12/02/2018 0527   HCT 35.8 09/17/2017 1428   PLT 300 12/02/2018 0527   PLT 271 10/21/2018 1055   PLT 231 09/17/2017 1428   MCV 96.1 12/02/2018 0527   MCV 93.2 09/17/2017 1428   MCH 28.5 12/02/2018 0527   MCHC 29.7 (L) 12/02/2018 0527   RDW 18.3 (H) 12/02/2018 0527   RDW 16.5 (H) 09/17/2017 1428   LYMPHSABS 1.0 12/02/2018 0527   LYMPHSABS 0.7 (L) 09/17/2017 1428   MONOABS 0.8 12/02/2018 0527   MONOABS 0.5 09/17/2017 1428   EOSABS 0.0 12/02/2018 0527   EOSABS 0.1 09/17/2017 1428   BASOSABS 0.0 12/02/2018 0527   BASOSABS 0.0 09/17/2017 1428   HEPATIC Function  Panel Recent Labs    11/14/18 0400 11/15/18 0931 11/21/18 0430  PROT 5.2* 5.3* 5.0*   HEMOGLOBIN A1C No components found for: HGA1C,  MPG CARDIAC ENZYMES Lab Results  Component Value Date   CKTOTAL 52 08/21/2017   CKMB 1.3 08/21/2017   TROPONINI 0.94 (HH) 11/13/2018   TROPONINI 0.92 (HH) 11/13/2018   TROPONINI 0.80 (HH) 11/12/2018   BNP No results for input(s): PROBNP in the last 8760 hours. TSH Recent Labs    12/13/17 2243 10/02/18 1825  TSH 1.922 0.751   CHOLESTEROL Recent Labs    12/14/17 0457 09/02/18 0152 10/03/18 0637  CHOL 167 143 136    Scheduled Meds: . amiodarone  100 mg Per Tube Daily  . amLODipine  2.5 mg Oral Daily  . budesonide (PULMICORT) nebulizer solution  0.25 mg Nebulization BID  . chlorhexidine  15 mL Mouth Rinse BID  . Chlorhexidine Gluconate Cloth  6 each Topical Daily  . feeding supplement (ENSURE ENLIVE)  237 mL Oral TID BM  . feeding supplement (PRO-STAT SUGAR FREE 64)  30 mL Oral BID  . hydrALAZINE  25 mg Oral BID  . ipratropium-albuterol  3 mL Nebulization BID  . mouth rinse  15 mL Mouth Rinse q12n4p  . metoprolol tartrate  12.5 mg Oral Daily  . potassium chloride  40 mEq Oral Once  . pyridostigmine  30 mg Per Tube BID  . sodium chloride flush  10-40 mL Intracatheter Q12H   Continuous Infusions: PRN Meds:.oxyCODONE, sodium chloride flush, traMADol  Assessment/Plan: V. Fib cardiac arrest Hypoxic encephalopathy Generalized weakness Gait abnormality CAD S/P stents in LAD and LCx Multiple myeloma Myasthenia gravis Right leg DVT Possible PE Right knee area pain and superficial hematoma H/O atria fibrillation Anemia of chronic disease and chronic blood loss CKD, II  Encourage oral feedings. May consider oral anticoagulation.   LOS: 21 days    [name]  MD  Dec 30, 2018, 9:43 AM

## 2018-12-19 NOTE — Progress Notes (Signed)
[facility] requested to keep PICC line in. Clarified with MD okay to keep PICC line in for d/c.

## 2018-12-19 NOTE — Care Management Note (Addendum)
Case Management Note  Patient Details  Name: Abigail [name] MRN: [id number] Date of Birth: 1946/02/02  Subjective/Objective: Pt presented for  VF arrest. Hx of myasthenia gravis. Poor po intake- failure to thrive. Plan for SNF vs Hospice               Action/Plan: Patient has insurance approval for SNF. CSW and palliative care following for transition of care needs. CM will continue to monitor.   Expected Discharge Date:                  Expected Discharge Plan:  [place]  In-House Referral:  Clinical Social Work, Hospice / Palliative Care  Discharge planning Services  CM Consult  Post Acute Care Choice:  NA Choice offered to:  NA  DME Arranged:  N/A DME Agency:  NA  HH Arranged:  NA HH Agency:  NA  Status of Service:  Completed, signed off  If discussed at [employer] of Stay Meetings, dates discussed:    Additional Comments: [id number] 12-03-17 Jacqlyn Krauss, RN,BSN [phone] Plan for [facility] today. CSW assisting with transition of care needs.  Bethena Roys, RN 12/01/2018, 10:39 AM

## 2018-12-19 NOTE — Plan of Care (Signed)
  Problem: Clinical Measurements: Goal: Respiratory complications will improve Outcome: Progressing Note:  No s/s of respiratory complications noted.  Stable on  room air.   

## 2018-12-19 DEATH — deceased

## 2019-01-19 ENCOUNTER — Encounter: Payer: Self-pay | Admitting: Oncology

## 2020-05-28 IMAGING — CR DG CHEST 2V
2 series · 2 of 2 positions shown · non-contrast
Comparison: September 01, 2018

CLINICAL DATA: Shortness of breath and chest pain

EXAM:
CHEST - 2 VIEW

[chest lat]
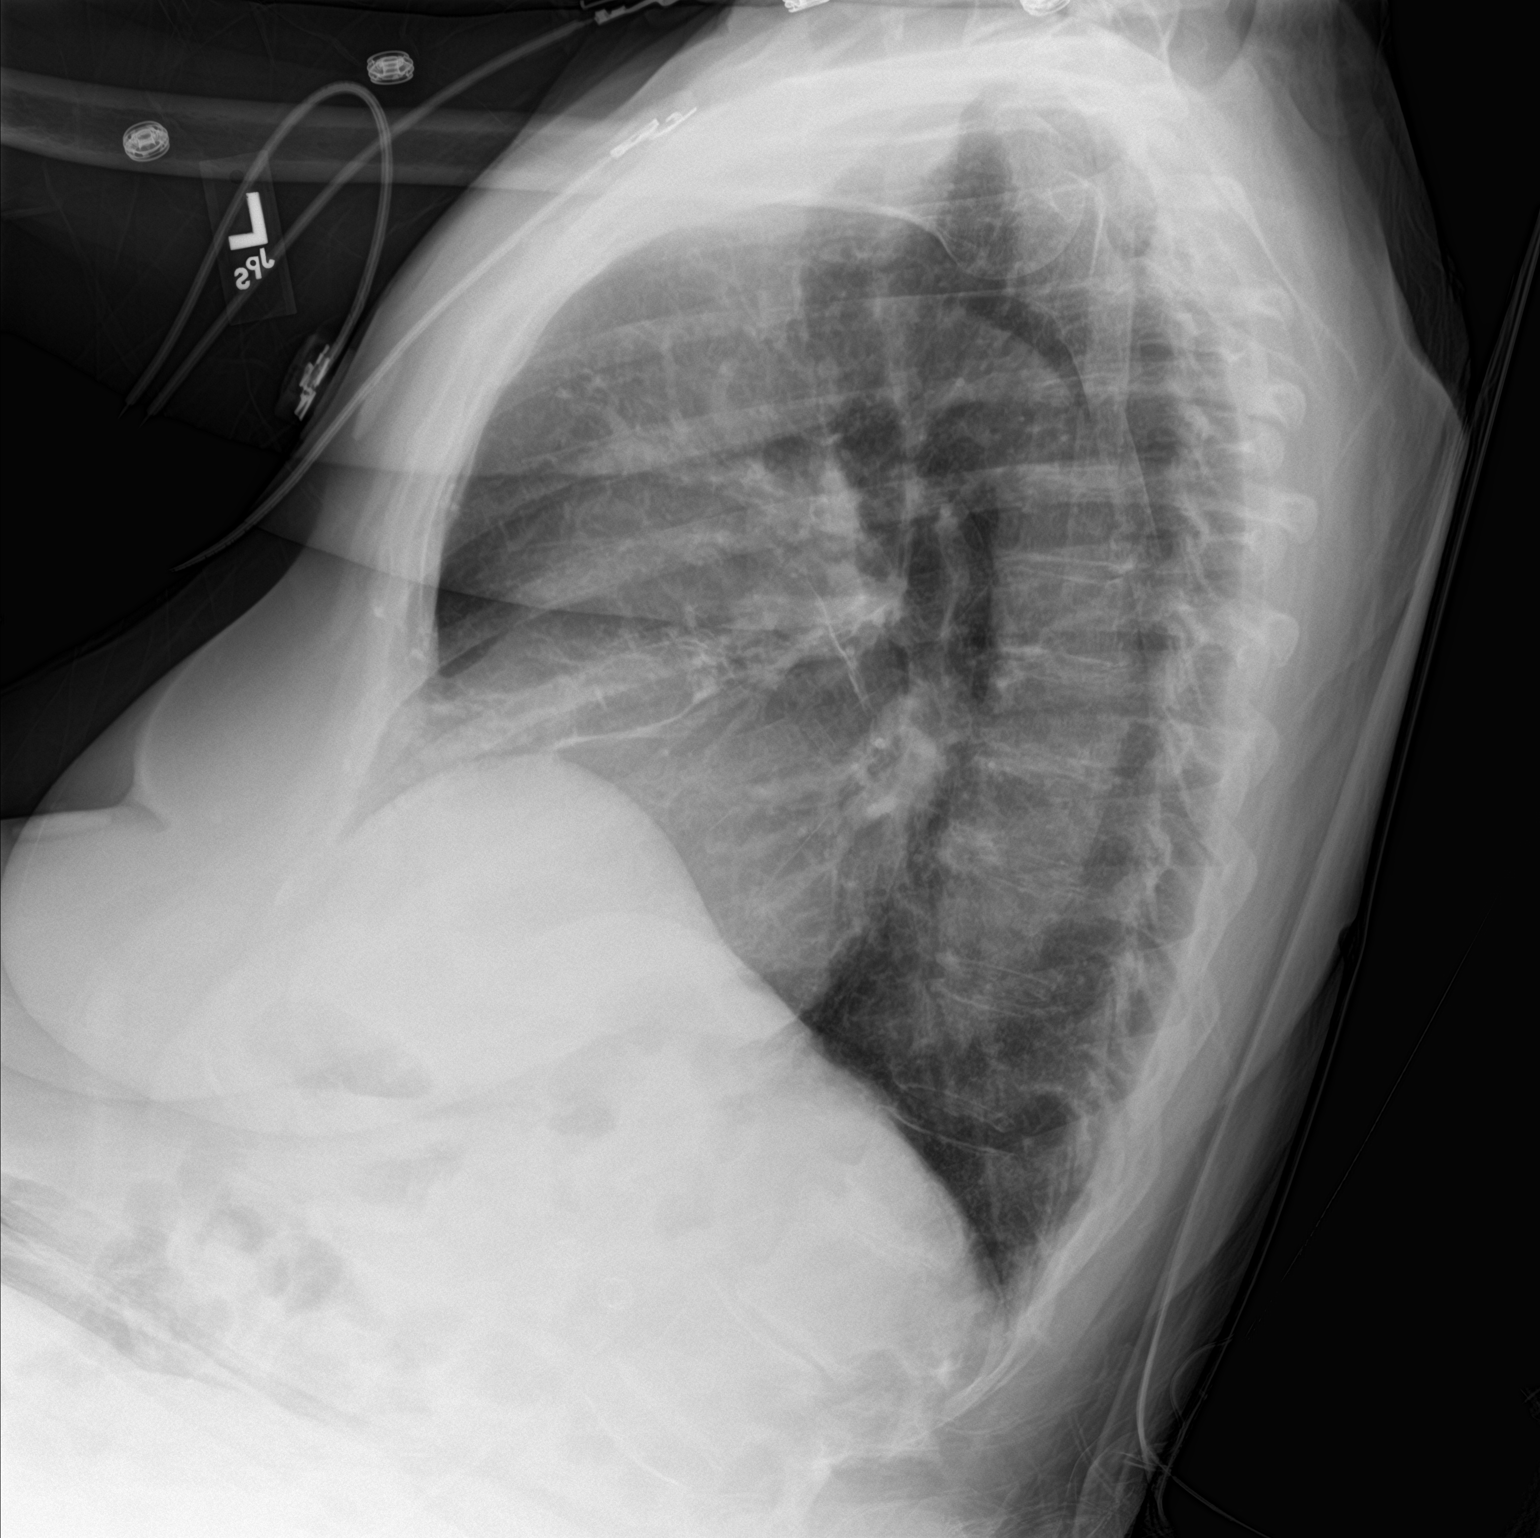

[chest ap]
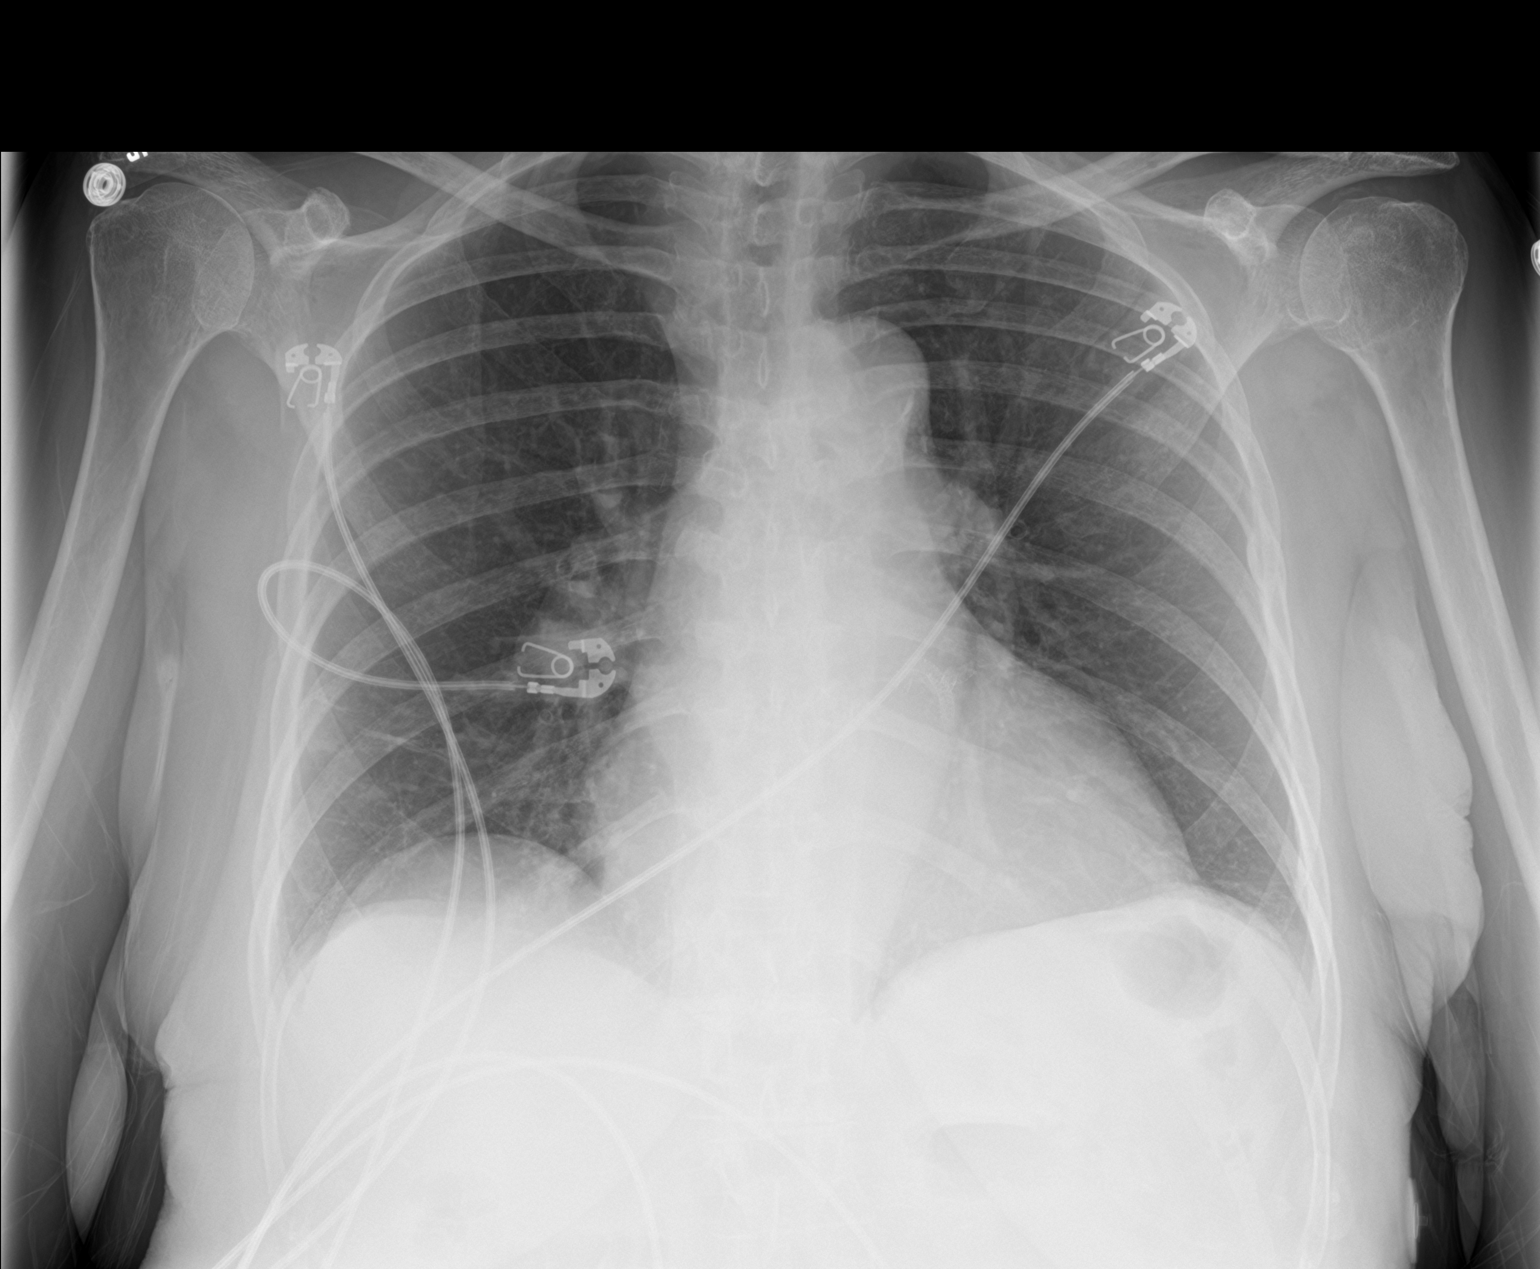

[2 of 2 positions shown; findings below may reference images not displayed]

FINDINGS: There is right base atelectasis. Lungs elsewhere are clear. Heart is
mildly enlarged with pulmonary vascularity normal. No adenopathy.
There is aortic atherosclerosis. No evident bone lesions.
IMPRESSION: Right base atelectasis. No edema or consolidation. Heart mildly
enlarged. There is aortic atherosclerosis.

Aortic Atherosclerosis ([id number]).

## 2020-07-04 IMAGING — DX DG CHEST 1V PORT
1 series · 1 of 1 positions shown · non-contrast
Comparison: Portable exam 5451 hours compared to 10/08/2018

CLINICAL DATA: Chest pain, abdominal pain, weakness, history
multiple myeloma, coronary artery disease post MI, hypertension,
arrhythmia is, smoker

EXAM:
PORTABLE CHEST 1 VIEW

[chest ap]
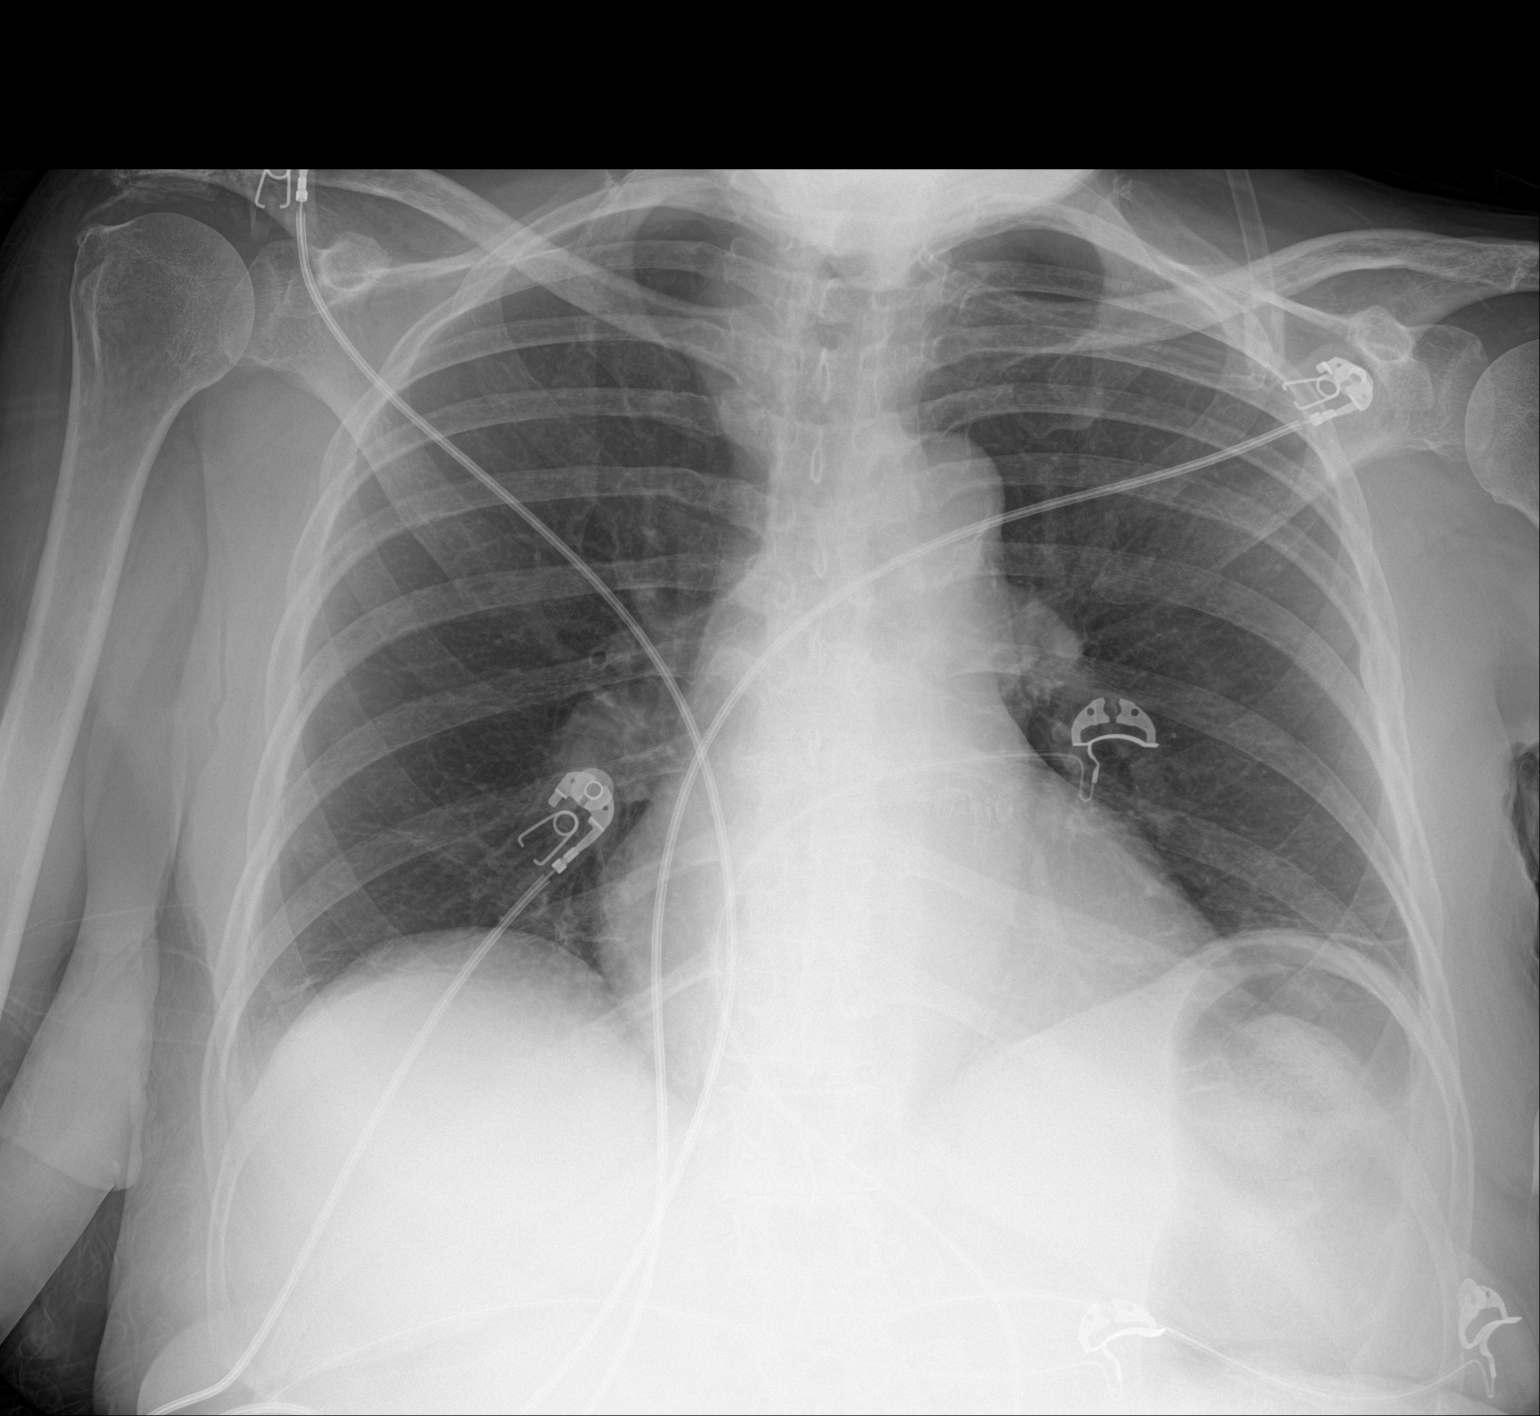

[1 of 1 positions shown; findings below may reference images not displayed]

FINDINGS: Upper normal heart size.

Atherosclerotic calcification aorta.

Mediastinal contours and pulmonary vascularity normal.

Minimal bibasilar atelectasis.

Lungs otherwise clear.

No infiltrate, pleural effusion or pneumothorax.

Inferior spur formation RIGHT acromion with adjacent nonspecific
soft tissue calcification.
IMPRESSION: Bibasilar atelectasis.
